# Patient Record
Sex: Female | Born: 1960
Health system: Southern US, Community
[De-identification: ages and names within clinical notes are randomized; demographics above are authoritative.]

## PROBLEM LIST (undated history)

## (undated) DIAGNOSIS — I10 Essential (primary) hypertension: Secondary | ICD-10-CM

## (undated) DIAGNOSIS — O009 Unspecified ectopic pregnancy without intrauterine pregnancy: Secondary | ICD-10-CM

## (undated) DIAGNOSIS — R51 Headache: Secondary | ICD-10-CM

## (undated) DIAGNOSIS — R112 Nausea with vomiting, unspecified: Secondary | ICD-10-CM

## (undated) DIAGNOSIS — Z9289 Personal history of other medical treatment: Secondary | ICD-10-CM

## (undated) DIAGNOSIS — D649 Anemia, unspecified: Secondary | ICD-10-CM

## (undated) DIAGNOSIS — F32A Depression, unspecified: Secondary | ICD-10-CM

## (undated) DIAGNOSIS — T8859XA Other complications of anesthesia, initial encounter: Secondary | ICD-10-CM

## (undated) DIAGNOSIS — IMO0002 Reserved for concepts with insufficient information to code with codable children: Secondary | ICD-10-CM

## (undated) DIAGNOSIS — J45909 Unspecified asthma, uncomplicated: Secondary | ICD-10-CM

## (undated) DIAGNOSIS — S069X1A Unspecified intracranial injury with loss of consciousness of 30 minutes or less, initial encounter: Secondary | ICD-10-CM

## (undated) DIAGNOSIS — R519 Headache, unspecified: Secondary | ICD-10-CM

## (undated) DIAGNOSIS — F329 Major depressive disorder, single episode, unspecified: Secondary | ICD-10-CM

## (undated) DIAGNOSIS — Z9889 Other specified postprocedural states: Secondary | ICD-10-CM

## (undated) DIAGNOSIS — T4145XA Adverse effect of unspecified anesthetic, initial encounter: Secondary | ICD-10-CM

## (undated) DIAGNOSIS — I739 Peripheral vascular disease, unspecified: Secondary | ICD-10-CM

## (undated) DIAGNOSIS — F4310 Post-traumatic stress disorder, unspecified: Secondary | ICD-10-CM

## (undated) DIAGNOSIS — S069X9A Unspecified intracranial injury with loss of consciousness of unspecified duration, initial encounter: Secondary | ICD-10-CM

## (undated) DIAGNOSIS — Z8489 Family history of other specified conditions: Secondary | ICD-10-CM

## (undated) DIAGNOSIS — K759 Inflammatory liver disease, unspecified: Secondary | ICD-10-CM

## (undated) DIAGNOSIS — C801 Malignant (primary) neoplasm, unspecified: Secondary | ICD-10-CM

## (undated) DIAGNOSIS — F419 Anxiety disorder, unspecified: Secondary | ICD-10-CM

## (undated) DIAGNOSIS — M1990 Unspecified osteoarthritis, unspecified site: Secondary | ICD-10-CM

## (undated) DIAGNOSIS — Z8041 Family history of malignant neoplasm of ovary: Secondary | ICD-10-CM

## (undated) HISTORY — PX: TUBAL LIGATION: SHX77

## (undated) HISTORY — PX: COLONOSCOPY: SHX174

## (undated) HISTORY — PX: ABDOMINAL HYSTERECTOMY: SHX81

## (undated) HISTORY — DX: Family history of malignant neoplasm of ovary: Z80.41

---

## 2004-04-04 ENCOUNTER — Encounter: Admission: RE | Admit: 2004-04-04 | Discharge: 2004-04-04 | Payer: Self-pay | Admitting: Unknown Physician Specialty

## 2004-05-20 ENCOUNTER — Observation Stay (HOSPITAL_COMMUNITY): Admission: RE | Admit: 2004-05-20 | Discharge: 2004-05-21 | Payer: Self-pay | Admitting: Interventional Radiology

## 2004-07-25 ENCOUNTER — Encounter: Admission: RE | Admit: 2004-07-25 | Discharge: 2004-07-25 | Payer: Self-pay | Admitting: Interventional Radiology

## 2005-01-23 ENCOUNTER — Ambulatory Visit: Payer: Self-pay | Admitting: Cardiology

## 2005-02-11 ENCOUNTER — Ambulatory Visit: Payer: Self-pay | Admitting: Cardiology

## 2005-02-20 ENCOUNTER — Ambulatory Visit: Payer: Self-pay | Admitting: Cardiology

## 2005-03-11 ENCOUNTER — Ambulatory Visit: Payer: Self-pay | Admitting: Cardiology

## 2005-04-09 ENCOUNTER — Ambulatory Visit: Payer: Self-pay | Admitting: Cardiology

## 2005-08-14 ENCOUNTER — Ambulatory Visit: Payer: Self-pay | Admitting: Cardiology

## 2010-12-14 ENCOUNTER — Encounter: Payer: Self-pay | Admitting: Interventional Radiology

## 2010-12-15 ENCOUNTER — Encounter: Payer: Self-pay | Admitting: Interventional Radiology

## 2010-12-15 ENCOUNTER — Encounter: Payer: Self-pay | Admitting: Unknown Physician Specialty

## 2013-07-14 DIAGNOSIS — Z90710 Acquired absence of both cervix and uterus: Secondary | ICD-10-CM | POA: Diagnosis not present

## 2013-07-14 DIAGNOSIS — Z0000 Encounter for general adult medical examination without abnormal findings: Secondary | ICD-10-CM | POA: Diagnosis not present

## 2013-07-18 DIAGNOSIS — S63509A Unspecified sprain of unspecified wrist, initial encounter: Secondary | ICD-10-CM | POA: Diagnosis not present

## 2013-07-18 DIAGNOSIS — Z79899 Other long term (current) drug therapy: Secondary | ICD-10-CM | POA: Diagnosis not present

## 2013-07-18 DIAGNOSIS — M19039 Primary osteoarthritis, unspecified wrist: Secondary | ICD-10-CM | POA: Diagnosis not present

## 2013-07-18 DIAGNOSIS — I10 Essential (primary) hypertension: Secondary | ICD-10-CM | POA: Diagnosis not present

## 2013-07-18 DIAGNOSIS — F17200 Nicotine dependence, unspecified, uncomplicated: Secondary | ICD-10-CM | POA: Diagnosis not present

## 2014-07-26 DIAGNOSIS — J00 Acute nasopharyngitis [common cold]: Secondary | ICD-10-CM | POA: Diagnosis not present

## 2014-07-26 DIAGNOSIS — I10 Essential (primary) hypertension: Secondary | ICD-10-CM | POA: Diagnosis not present

## 2014-07-26 DIAGNOSIS — R059 Cough, unspecified: Secondary | ICD-10-CM | POA: Diagnosis not present

## 2014-07-26 DIAGNOSIS — R05 Cough: Secondary | ICD-10-CM | POA: Diagnosis not present

## 2014-09-14 DIAGNOSIS — I10 Essential (primary) hypertension: Secondary | ICD-10-CM | POA: Diagnosis not present

## 2014-09-16 ENCOUNTER — Inpatient Hospital Stay (HOSPITAL_COMMUNITY)
Admission: AD | Admit: 2014-09-16 | Discharge: 2014-09-22 | DRG: 330 | Disposition: A | Discharge status: Home / Self Care | Payer: Medicare Other | Source: Other Acute Inpatient Hospital | Attending: General Surgery | Admitting: General Surgery

## 2014-09-16 ENCOUNTER — Encounter (HOSPITAL_COMMUNITY): Payer: Self-pay | Admitting: Gastroenterology

## 2014-09-16 DIAGNOSIS — I10 Essential (primary) hypertension: Secondary | ICD-10-CM | POA: Diagnosis not present

## 2014-09-16 DIAGNOSIS — G8929 Other chronic pain: Secondary | ICD-10-CM | POA: Diagnosis present

## 2014-09-16 DIAGNOSIS — D490 Neoplasm of unspecified behavior of digestive system: Secondary | ICD-10-CM | POA: Diagnosis not present

## 2014-09-16 DIAGNOSIS — K2960 Other gastritis without bleeding: Secondary | ICD-10-CM | POA: Diagnosis present

## 2014-09-16 DIAGNOSIS — K567 Ileus, unspecified: Secondary | ICD-10-CM | POA: Diagnosis not present

## 2014-09-16 DIAGNOSIS — K5660 Unspecified intestinal obstruction: Secondary | ICD-10-CM | POA: Diagnosis not present

## 2014-09-16 DIAGNOSIS — D649 Anemia, unspecified: Secondary | ICD-10-CM | POA: Diagnosis present

## 2014-09-16 DIAGNOSIS — M1990 Unspecified osteoarthritis, unspecified site: Secondary | ICD-10-CM | POA: Diagnosis present

## 2014-09-16 DIAGNOSIS — R112 Nausea with vomiting, unspecified: Secondary | ICD-10-CM | POA: Diagnosis not present

## 2014-09-16 DIAGNOSIS — Z789 Other specified health status: Secondary | ICD-10-CM | POA: Diagnosis present

## 2014-09-16 DIAGNOSIS — F1210 Cannabis abuse, uncomplicated: Secondary | ICD-10-CM | POA: Diagnosis present

## 2014-09-16 DIAGNOSIS — K5289 Other specified noninfective gastroenteritis and colitis: Secondary | ICD-10-CM | POA: Diagnosis not present

## 2014-09-16 DIAGNOSIS — K920 Hematemesis: Secondary | ICD-10-CM | POA: Diagnosis present

## 2014-09-16 DIAGNOSIS — R1900 Intra-abdominal and pelvic swelling, mass and lump, unspecified site: Secondary | ICD-10-CM | POA: Diagnosis not present

## 2014-09-16 DIAGNOSIS — F17290 Nicotine dependence, other tobacco product, uncomplicated: Secondary | ICD-10-CM | POA: Diagnosis present

## 2014-09-16 DIAGNOSIS — C180 Malignant neoplasm of cecum: Secondary | ICD-10-CM | POA: Diagnosis present

## 2014-09-16 DIAGNOSIS — E876 Hypokalemia: Secondary | ICD-10-CM | POA: Diagnosis not present

## 2014-09-16 DIAGNOSIS — C172 Malignant neoplasm of ileum: Secondary | ICD-10-CM | POA: Diagnosis not present

## 2014-09-16 DIAGNOSIS — C182 Malignant neoplasm of ascending colon: Secondary | ICD-10-CM | POA: Diagnosis not present

## 2014-09-16 DIAGNOSIS — C786 Secondary malignant neoplasm of retroperitoneum and peritoneum: Secondary | ICD-10-CM | POA: Diagnosis not present

## 2014-09-16 DIAGNOSIS — D500 Iron deficiency anemia secondary to blood loss (chronic): Secondary | ICD-10-CM | POA: Diagnosis not present

## 2014-09-16 DIAGNOSIS — K449 Diaphragmatic hernia without obstruction or gangrene: Secondary | ICD-10-CM | POA: Diagnosis not present

## 2014-09-16 DIAGNOSIS — E871 Hypo-osmolality and hyponatremia: Secondary | ICD-10-CM | POA: Diagnosis not present

## 2014-09-16 DIAGNOSIS — Z7289 Other problems related to lifestyle: Secondary | ICD-10-CM | POA: Diagnosis present

## 2014-09-16 DIAGNOSIS — K922 Gastrointestinal hemorrhage, unspecified: Secondary | ICD-10-CM | POA: Diagnosis not present

## 2014-09-16 DIAGNOSIS — R109 Unspecified abdominal pain: Secondary | ICD-10-CM | POA: Diagnosis not present

## 2014-09-16 DIAGNOSIS — R195 Other fecal abnormalities: Secondary | ICD-10-CM | POA: Diagnosis not present

## 2014-09-16 DIAGNOSIS — K2940 Chronic atrophic gastritis without bleeding: Secondary | ICD-10-CM | POA: Diagnosis not present

## 2014-09-16 DIAGNOSIS — F17200 Nicotine dependence, unspecified, uncomplicated: Secondary | ICD-10-CM | POA: Diagnosis not present

## 2014-09-16 DIAGNOSIS — R634 Abnormal weight loss: Secondary | ICD-10-CM | POA: Diagnosis present

## 2014-09-16 DIAGNOSIS — Z01818 Encounter for other preprocedural examination: Secondary | ICD-10-CM

## 2014-09-16 DIAGNOSIS — K561 Intussusception: Secondary | ICD-10-CM | POA: Diagnosis not present

## 2014-09-16 DIAGNOSIS — K921 Melena: Secondary | ICD-10-CM | POA: Diagnosis not present

## 2014-09-16 DIAGNOSIS — Z79899 Other long term (current) drug therapy: Secondary | ICD-10-CM | POA: Diagnosis not present

## 2014-09-16 DIAGNOSIS — F1099 Alcohol use, unspecified with unspecified alcohol-induced disorder: Secondary | ICD-10-CM

## 2014-09-16 DIAGNOSIS — R1013 Epigastric pain: Secondary | ICD-10-CM | POA: Diagnosis present

## 2014-09-16 HISTORY — DX: Essential (primary) hypertension: I10

## 2014-09-16 HISTORY — DX: Unspecified ectopic pregnancy without intrauterine pregnancy: O00.90

## 2014-09-16 LAB — COMPREHENSIVE METABOLIC PANEL
ALK PHOS: 92 U/L (ref 39–117)
ALT: 8 U/L (ref 0–35)
AST: 17 U/L (ref 0–37)
Albumin: 3.7 g/dL (ref 3.5–5.2)
Anion gap: 14 (ref 5–15)
BILIRUBIN TOTAL: 0.4 mg/dL (ref 0.3–1.2)
BUN: 8 mg/dL (ref 6–23)
CHLORIDE: 102 meq/L (ref 96–112)
CO2: 23 meq/L (ref 19–32)
Calcium: 9.1 mg/dL (ref 8.4–10.5)
Creatinine, Ser: 0.86 mg/dL (ref 0.50–1.10)
GFR calc Af Amer: 88 mL/min — ABNORMAL LOW (ref >90)
GFR, EST NON AFRICAN AMERICAN: 76 mL/min — AB (ref >90)
Glucose, Bld: 102 mg/dL — ABNORMAL HIGH (ref 70–99)
Potassium: 3.7 mEq/L (ref 3.7–5.3)
Sodium: 139 mEq/L (ref 137–147)
Total Protein: 7.6 g/dL (ref 6.0–8.3)

## 2014-09-16 LAB — PROTIME-INR
INR: 1.06 (ref 0.00–1.49)
PROTHROMBIN TIME: 13.9 s (ref 11.6–15.2)

## 2014-09-16 LAB — CBC
HEMATOCRIT: 32.3 % — AB (ref 36.0–46.0)
HEMOGLOBIN: 11.7 g/dL — AB (ref 12.0–15.0)
MCH: 35.2 pg — ABNORMAL HIGH (ref 26.0–34.0)
MCHC: 36.2 g/dL — AB (ref 30.0–36.0)
MCV: 97.3 fL (ref 78.0–100.0)
PLATELETS: 168 10*3/uL (ref 150–400)
RBC: 3.32 MIL/uL — ABNORMAL LOW (ref 3.87–5.11)
RDW: 12 % (ref 11.5–15.5)
WBC: 5.8 10*3/uL (ref 4.0–10.5)

## 2014-09-16 LAB — LIPASE, BLOOD: Lipase: 14 U/L (ref 11–59)

## 2014-09-16 MED ORDER — FOLIC ACID 1 MG PO TABS
1.0000 mg | ORAL_TABLET | Freq: Every day | ORAL | Status: DC
  Administered 2014-09-17 – 2014-09-21 (×4): 1 mg via ORAL
  Filled 2014-09-16 (×7): qty 1

## 2014-09-16 MED ORDER — ACETAMINOPHEN 325 MG PO TABS
650.0000 mg | ORAL_TABLET | Freq: Four times a day (QID) | ORAL | Status: DC | PRN
  Administered 2014-09-17: 650 mg via ORAL
  Filled 2014-09-16: qty 2

## 2014-09-16 MED ORDER — TRAMADOL HCL 50 MG PO TABS
25.0000 mg | ORAL_TABLET | Freq: Four times a day (QID) | ORAL | Status: DC | PRN
  Administered 2014-09-16 – 2014-09-18 (×4): 25 mg via ORAL
  Filled 2014-09-16 (×4): qty 1

## 2014-09-16 MED ORDER — ONDANSETRON HCL 4 MG PO TABS
4.0000 mg | ORAL_TABLET | Freq: Four times a day (QID) | ORAL | Status: DC | PRN
  Administered 2014-09-21: 4 mg via ORAL
  Filled 2014-09-16: qty 1

## 2014-09-16 MED ORDER — THIAMINE HCL 100 MG/ML IJ SOLN
100.0000 mg | Freq: Every day | INTRAMUSCULAR | Status: DC
  Filled 2014-09-16 (×7): qty 1

## 2014-09-16 MED ORDER — LORAZEPAM 1 MG PO TABS
1.0000 mg | ORAL_TABLET | Freq: Four times a day (QID) | ORAL | Status: AC | PRN
  Administered 2014-09-16: 1 mg via ORAL
  Filled 2014-09-16: qty 1

## 2014-09-16 MED ORDER — VITAMIN B-1 100 MG PO TABS
100.0000 mg | ORAL_TABLET | Freq: Every day | ORAL | Status: DC
  Administered 2014-09-17 – 2014-09-22 (×5): 100 mg via ORAL
  Filled 2014-09-16 (×7): qty 1

## 2014-09-16 MED ORDER — SODIUM CHLORIDE 0.9 % IV SOLN
INTRAVENOUS | Status: DC
  Administered 2014-09-16 – 2014-09-17 (×2): via INTRAVENOUS

## 2014-09-16 MED ORDER — ADULT MULTIVITAMIN W/MINERALS CH
1.0000 | ORAL_TABLET | Freq: Every day | ORAL | Status: DC
  Administered 2014-09-17: 1 via ORAL
  Filled 2014-09-16 (×3): qty 1

## 2014-09-16 MED ORDER — ONDANSETRON HCL 4 MG/2ML IJ SOLN
4.0000 mg | Freq: Four times a day (QID) | INTRAMUSCULAR | Status: DC | PRN
  Administered 2014-09-17 – 2014-09-22 (×2): 4 mg via INTRAVENOUS
  Filled 2014-09-16 (×2): qty 2

## 2014-09-16 MED ORDER — PANTOPRAZOLE SODIUM 40 MG IV SOLR
40.0000 mg | Freq: Two times a day (BID) | INTRAVENOUS | Status: DC
  Administered 2014-09-16 – 2014-09-17 (×2): 40 mg via INTRAVENOUS
  Filled 2014-09-16 (×3): qty 40

## 2014-09-16 MED ORDER — GI COCKTAIL ~~LOC~~
30.0000 mL | Freq: Three times a day (TID) | ORAL | Status: DC | PRN
  Administered 2014-09-16: 30 mL via ORAL
  Filled 2014-09-16 (×2): qty 30

## 2014-09-16 MED ORDER — LORAZEPAM 2 MG/ML IJ SOLN
1.0000 mg | Freq: Four times a day (QID) | INTRAMUSCULAR | Status: AC | PRN
  Administered 2014-09-17: 1 mg via INTRAVENOUS
  Filled 2014-09-16: qty 1

## 2014-09-16 NOTE — H&P (Signed)
   History and Physical    Catherine Rogers OXB:[id number] DOB: Apr 07, 1961 DOA: 09/16/2014  Referring physician: [facility] PCP: No primary provider on file.  Specialists: GI, Dr. Watt Climes  Chief Complaint: GI bleed  HPI: Catherine Rogers is a 53 y.o. female has a past medical history significant for chronic pain due to arthritis, HTN, marijuana use, coming from [facility] with G bleed and without GI services available over there. Patient states that she started having abdominal pain last night, mid-epigastric, with nausea and vomiting and poor po intake since. She also noticed coffee ground material as well as fresh blood in her emesis. She also noticed dark stools for the past 2 days and today noticed also some red and possible blood clots. Denies fevers but endorses chills, no chest pain or breathing difficulties. She is a smoker and smokes only marijuana to help with her chronic pain. She endorses drinking ETOH daily, states is not a big drinker, has one "cooler" per day, drank more in the past and used to use Gin. She states that she was never a big drinker. She takes Ibuprofen 800 mg almost daily and sometimes three times per day due to pain. She is not on any PPI and takes Ranitidine "once in a blue moon" is she has discomfort.   Review of Systems:  As per HPI otherwise negative   Past medical history HTN  Social History: marijuana, alcohol  Allergies Codeine Meperidine Morphine Penicillins  Family history non contributory   Home medications: Ibuprofen HCTZ Ranitidine  Physical Exam: Filed Vitals:   09/16/14 1649  BP: 113/67  Pulse: 71  Temp: 98.4 F (36.9 C)  TempSrc: Oral  Resp: 16  Height: $Remove'5\' 7"'aEkkxWX$  (1.702 m)  Weight: 67.45 kg (148 lb 11.2 oz)  SpO2: 96%    General:  No apparent distress  Eyes: no scleral icterus  ENT: moist oropharynx  Neck: supple, no JVD  Cardiovascular: regular rate without MRG; 2+ peripheral pulses  Respiratory: CTA biL,  good air movement without wheezing  Abdomen: soft, mild tenderness to palpation epigastric area  Skin: no rashes  Musculoskeletal: no peripheral edema  Psychiatric: normal mood and affect  Neurologic: non focal  Labs at [facility] Hb 12.3 MCV 102 Platelets 192 INR 1.0 BUN 10 Creatinine 1.0 Ca 8.9 Total Protein 7.7 Albumin 4.1 Alk phos 83 AST 23 T bili 0.7 ALT 12 Na 135 CO2 22 K 3.1   EKG: Independently reviewed. Sinus rhythm  Assessment/Plan Active Problems:   GI bleed   Alcohol use    GI bleed - patient with active ETOH use and Ibuprofen daily, concern for bleeding ulcer - no apparent liver disease with normal platelets, INR, LFTs - discussed with Dr. Watt Climes from [place], she is stable currently, Hb 12, normotensive, will plan for upper EGD in am, will allow clear liquids tonight and NPO past midnight  ETOH use  - place on CIWA  Chronic pain - patient wants to avoid narcotics not to get "hooked on" - tylenol / tramadol for mild/moderate pain   Diet: clears Fluids: NS at 100 cc /h  DVT Prophylaxis: SCDs  Code Status: Full  Family Communication:  D/w patient   Disposition Plan: inpatient, home when stable  Time spent: 42  Yamen Castrogiovanni M. Cruzita Lederer, MD [facility] Pager [phone]  If 7PM-7AM, please contact night-coverage [email or web address] Password [facility] 09/16/2014, 4:35 PM

## 2014-09-16 NOTE — Consult Note (Deleted)
Reason for Consult:probable food impaction Referring Physician: ER team  Catherine Rogers is an 53 y.o. female.  HPI: patient well-known to my partner who called me earlier today and had a probable food impaction and they were directed to the emergency room when it did not resolve and glucagon there and nitroglycerin was not helpful and the patient and her husband have consented to an endoscopy and she has a history of swallowing problems and achalasia status post surgery and a dilation about a year ago and her office computer chart was reviewed and she's been having some mild dysphagia episodically but nothing this bad and she has no known allergies and is not on any aspirin or blood thinners  History reviewed. No pertinent past medical history.  History reviewed. No pertinent past surgical history.  No family history on file.  Social History:  has no tobacco, alcohol, and drug history on file.  Allergies: Not on File  Medications: I have reviewed the patient's current medications.  No results found for this or any previous visit (from the past 48 hour(s)).  No results found.  ROSnegative except above Blood pressure 113/67, pulse 71, temperature 98.4 F (36.9 C), temperature source Oral, resp. rate 16, height 5\' 7"  (1.702 m), weight 67.45 kg (148 lb 11.2 oz), SpO2 96.00%. Physical Examvital signs stable afebrile acute distress examined please see preassessment evaluation  Assessment/Plan: Probable food impaction Plan: The risks benefits methods of endoscopy and removing food or pushing the food down was discussed with the patient and her husband and will proceed ASAP with further workup and plans pending those findings  Shakaya Bhullar E 09/16/2014, 6:00 PM

## 2014-09-17 ENCOUNTER — Encounter (HOSPITAL_COMMUNITY): Payer: Medicare Other | Admitting: Anesthesiology

## 2014-09-17 ENCOUNTER — Inpatient Hospital Stay (HOSPITAL_COMMUNITY): Payer: Medicare Other | Admitting: Anesthesiology

## 2014-09-17 ENCOUNTER — Encounter (HOSPITAL_COMMUNITY)
Admission: AD | Disposition: A | Discharge status: Home / Self Care | Payer: Self-pay | Source: Other Acute Inpatient Hospital

## 2014-09-17 ENCOUNTER — Encounter (HOSPITAL_COMMUNITY): Payer: Self-pay | Admitting: *Deleted

## 2014-09-17 ENCOUNTER — Inpatient Hospital Stay (HOSPITAL_COMMUNITY): Payer: Medicare Other

## 2014-09-17 HISTORY — PX: ESOPHAGOGASTRODUODENOSCOPY: SHX5428

## 2014-09-17 LAB — CBC
HCT: 27.9 % — ABNORMAL LOW (ref 36.0–46.0)
HCT: 27.4 % — ABNORMAL LOW (ref 36.0–46.0)
HEMOGLOBIN: 10.1 g/dL — AB (ref 12.0–15.0)
Hemoglobin: 10.1 g/dL — ABNORMAL LOW (ref 12.0–15.0)
MCH: 34.5 pg — ABNORMAL HIGH (ref 26.0–34.0)
MCH: 35.1 pg — ABNORMAL HIGH (ref 26.0–34.0)
MCHC: 36.2 g/dL — ABNORMAL HIGH (ref 30.0–36.0)
MCHC: 36.9 g/dL — ABNORMAL HIGH (ref 30.0–36.0)
MCV: 95.2 fL (ref 78.0–100.0)
MCV: 95.1 fL (ref 78.0–100.0)
PLATELETS: 141 10*3/uL — AB (ref 150–400)
Platelets: 164 10*3/uL (ref 150–400)
RBC: 2.93 MIL/uL — AB (ref 3.87–5.11)
RBC: 2.88 MIL/uL — AB (ref 3.87–5.11)
RDW: 11.8 % (ref 11.5–15.5)
RDW: 11.7 % (ref 11.5–15.5)
WBC: 5.4 10*3/uL (ref 4.0–10.5)
WBC: 5.6 10*3/uL (ref 4.0–10.5)

## 2014-09-17 SURGERY — EGD (ESOPHAGOGASTRODUODENOSCOPY)
Anesthesia: Monitor Anesthesia Care

## 2014-09-17 SURGERY — ESOPHAGOGASTRODUODENOSCOPY (EGD) WITH PROPOFOL
Anesthesia: Monitor Anesthesia Care

## 2014-09-17 MED ORDER — HYDRALAZINE HCL 20 MG/ML IJ SOLN: 5.0000 mg | INTRAMUSCULAR | Status: DC | PRN

## 2014-09-17 MED ORDER — LIDOCAINE HCL (CARDIAC) 20 MG/ML IV SOLN
INTRAVENOUS | Status: DC | PRN
  Administered 2014-09-17: 40 mg via INTRAVENOUS

## 2014-09-17 MED ORDER — PANTOPRAZOLE SODIUM 40 MG PO TBEC
40.0000 mg | DELAYED_RELEASE_TABLET | Freq: Two times a day (BID) | ORAL | Status: DC
  Administered 2014-09-17: 40 mg via ORAL
  Filled 2014-09-17 (×2): qty 1

## 2014-09-17 MED ORDER — PROPOFOL INFUSION 10 MG/ML OPTIME
INTRAVENOUS | Status: DC | PRN
Start: 2014-09-17 — End: 2014-09-17
  Administered 2014-09-17: 100 ug/kg/min via INTRAVENOUS

## 2014-09-17 MED ORDER — PROMETHAZINE HCL 25 MG/ML IJ SOLN: 6.2500 mg | INTRAMUSCULAR | Status: DC | PRN

## 2014-09-17 MED ORDER — IOHEXOL 300 MG/ML  SOLN
100.0000 mL | Freq: Once | INTRAMUSCULAR | Status: AC | PRN
  Administered 2014-09-17: 100 mL via INTRAVENOUS

## 2014-09-17 MED ORDER — IOHEXOL 300 MG/ML  SOLN
25.0000 mL | INTRAMUSCULAR | Status: AC
  Administered 2014-09-17 (×2): 25 mL via ORAL

## 2014-09-17 MED ORDER — SODIUM CHLORIDE 0.9 % IV SOLN
INTRAVENOUS | Status: DC
  Administered 2014-09-17: 08:00:00 via INTRAVENOUS

## 2014-09-17 MED ORDER — LISINOPRIL 5 MG PO TABS
5.0000 mg | ORAL_TABLET | Freq: Every day | ORAL | Status: DC
  Administered 2014-09-17: 5 mg via ORAL
  Filled 2014-09-17 (×2): qty 1

## 2014-09-17 MED ORDER — MIDAZOLAM HCL 2 MG/2ML IJ SOLN
INTRAMUSCULAR | Status: DC | PRN
  Administered 2014-09-17: 2 mg via INTRAVENOUS

## 2014-09-17 MED ORDER — BUTAMBEN-TETRACAINE-BENZOCAINE 2-2-14 % EX AERO
INHALATION_SPRAY | CUTANEOUS | Status: DC | PRN
  Administered 2014-09-17: 2 via TOPICAL

## 2014-09-17 MED ORDER — PROPOFOL 10 MG/ML IV BOLUS
INTRAVENOUS | Status: DC | PRN
  Administered 2014-09-17: 10 mg via INTRAVENOUS
  Administered 2014-09-17 (×2): 20 mg via INTRAVENOUS

## 2014-09-17 MED ORDER — HYDROMORPHONE HCL 1 MG/ML IJ SOLN
0.2500 mg | INTRAMUSCULAR | Status: DC | PRN
  Administered 2014-09-17: 0.5 mg via INTRAVENOUS
  Filled 2014-09-17: qty 1

## 2014-09-17 NOTE — Progress Notes (Signed)
   PROGRESS NOTE  Renika Shiflet SPQ:[id number] DOB: 12-Jul-1961 DOA: 09/16/2014 PCP: No PCP Per Patient  HPI: Destini Cambre is a 53 y.o. female has a past medical history significant for chronic pain due to arthritis, HTN, marijuana use, coming from [facility] with G bleed and without GI services available over there.   Subjective/ 24 H Interval events - did well with the EGD, continues to have abdominal pain   Active Problems:   GI bleed   Alcohol use   Assessment/Plan: GI bleed  - GI consulted, s/p EGD this morning without a clear source - no apparent liver disease with normal platelets, INR, LFTs  - continues to have abdominal pain, obtain CT abdomen pelvis ETOH use  - place on CIWA  - not triggering Chronic pain  - patient wants to avoid narcotics not to get "hooked on"  - tylenol / tramadol for mild/moderate pain   Diet: clears Fluids: none DVT Prophylaxis: SCD  Code Status: Full Family Communication: none  Disposition Plan: home when ready   Consultants:  GI  Procedures:  EGD 10/25 ENDOSCOPIC IMPRESSION: 1. Tiny hiatal hernia 2. Antritis 3. Otherwise within normal limits EGD   Antibiotics None   Studies None   Objective  Filed Vitals:   09/17/14 1130 09/17/14 1137 09/17/14 1200 09/17/14 1219  BP: 178/85 175/86 176/74 132/69  Pulse:   74 64  Temp:    98.4 F (36.9 C)  TempSrc:    Oral  Resp: 18 18  16   Height:      Weight:      SpO2: 100% 99%  99%    Intake/Output Summary (Last 24 hours) at 09/17/14 1401 Last data filed at 09/17/14 1153  Gross per 24 hour  Intake 1638.33 ml  Output      0 ml  Net 1638.33 ml   Filed Weights   09/16/14 1649 09/17/14 0824  Weight: 67.45 kg (148 lb 11.2 oz) 67.45 kg (148 lb 11.2 oz)    Exam:  General:  NAD  Cardiovascular: RRR  Respiratory: CTA biL  Abdomen: diffusely tender, epigastric area  MSK: no edema  Data Reviewed: Basic Metabolic Panel:  Recent Labs Lab 09/16/14 1815    NA 139  K 3.7  CL 102  CO2 23  GLUCOSE 102*  BUN 8  CREATININE 0.86  CALCIUM 9.1   Liver Function Tests:  Recent Labs Lab 09/16/14 1815  AST 17  ALT 8  ALKPHOS 92  BILITOT 0.4  PROT 7.6  ALBUMIN 3.7    Recent Labs Lab 09/16/14 1815  LIPASE 14   CBC:  Recent Labs Lab 09/16/14 1815 09/17/14 0053 09/17/14 0920  WBC 5.8 5.4 5.6  HGB 11.7* 10.1* 10.1*  HCT 32.3* 27.9* 27.4*  MCV 97.3 95.2 95.1  PLT 168 164 141*   Scheduled Meds: . folic acid  1 mg Oral Daily  . multivitamin with minerals  1 tablet Oral Daily  . pantoprazole  40 mg Oral BID AC  . thiamine  100 mg Oral Daily   Or  . thiamine  100 mg Intravenous Daily   Continuous Infusions:   Time spent: 25 minutes  Marzetta Board, MD [facility] Pager [phone]. If 7 PM - 7 AM, please contact night-coverage at [email or web address], password [facility] 09/17/2014, 2:01 PM  LOS: 1 day

## 2014-09-17 NOTE — Anesthesia Postprocedure Evaluation (Signed)
Anesthesia Post Note  Patient: Catherine Rogers  Procedure(s) Performed: Procedure(s) (LRB): ESOPHAGOGASTRODUODENOSCOPY (EGD) (N/A)  Anesthesia type: MAC  Patient location: PACU  Post pain: Pain level controlled  Post assessment: Patient's Cardiovascular Status Stable  Last Vitals:  Filed Vitals:   09/17/14 1219  BP: 132/69  Pulse: 64  Temp: 36.9 C  Resp: 16    Post vital signs: Reviewed and stable  Level of consciousness: sedated  Complications: No apparent anesthesia complications

## 2014-09-17 NOTE — Progress Notes (Signed)
Patient was assisted to the bathroom at 04:15 because of nausea.  Patient vomited green bile.  Medicated patient with antiemetic, will continue to monitor.  Wilson Singer, RN

## 2014-09-17 NOTE — Consult Note (Signed)
Reason for Consult: Upper GI bleeding Referring Physician: Hospital team  Catherine Rogers is an 53 y.o. female.  HPI: Patient with a history of reflux and indigestion help with Zantac who has been on some aspirin and nonsteroidals and has had some increased abdominal pain and some hematemesis with both fresh and coffee ground material as well as some blood in her stool which can be dark and bright red and she's never had a GI workup and her family history is negative and her allergies are reviewed and although she smokes marijuana and tobacco should minimize his alcohol and denies other drug use and has no other specific complaints  Past Medical History  Diagnosis Date  . Hypertension     History reviewed. No pertinent past surgical history.  History reviewed. No pertinent family history.  Social History:  reports that she has been smoking Cigarettes.  She has a 1.25 pack-year smoking history. She does not have any smokeless tobacco history on file. She reports that she drinks alcohol. She reports that she uses illicit drugs (Marijuana).  Allergies:  Allergies  Allergen Reactions  . Demerol [Meperidine] Nausea And Vomiting  . Morphine And Related Nausea And Vomiting  . Penicillins Other (See Comments)    Childhood allergy    Medications: I have reviewed the patient's current medications.  Results for orders placed during the hospital encounter of 09/16/14 (from the past 48 hour(s))  CBC     Status: Abnormal   Collection Time    09/16/14  6:15 PM      Result Value Ref Range   WBC 5.8  4.0 - 10.5 K/uL   RBC 3.32 (*) 3.87 - 5.11 MIL/uL   Hemoglobin 11.7 (*) 12.0 - 15.0 g/dL   HCT 32.3 (*) 36.0 - 46.0 %   MCV 97.3  78.0 - 100.0 fL   MCH 35.2 (*) 26.0 - 34.0 pg   MCHC 36.2 (*) 30.0 - 36.0 g/dL   RDW 12.0  11.5 - 15.5 %   Platelets 168  150 - 400 K/uL  COMPREHENSIVE METABOLIC PANEL     Status: Abnormal   Collection Time    09/16/14  6:15 PM      Result Value Ref Range   Sodium  139  137 - 147 mEq/L   Potassium 3.7  3.7 - 5.3 mEq/L   Chloride 102  96 - 112 mEq/L   CO2 23  19 - 32 mEq/L   Glucose, Bld 102 (*) 70 - 99 mg/dL   BUN 8  6 - 23 mg/dL   Creatinine, Ser 0.86  0.50 - 1.10 mg/dL   Calcium 9.1  8.4 - 10.5 mg/dL   Total Protein 7.6  6.0 - 8.3 g/dL   Albumin 3.7  3.5 - 5.2 g/dL   AST 17  0 - 37 U/L   ALT 8  0 - 35 U/L   Alkaline Phosphatase 92  39 - 117 U/L   Total Bilirubin 0.4  0.3 - 1.2 mg/dL   GFR calc non Af Amer 76 (*) >90 mL/min   GFR calc Af Amer 88 (*) >90 mL/min   Comment: (NOTE)     The eGFR has been calculated using the CKD EPI equation.     This calculation has not been validated in all clinical situations.     eGFR's persistently <90 mL/min signify possible Chronic Kidney     Disease.   Anion gap 14  5 - 15  PROTIME-INR     Status: None  Collection Time    09/16/14  6:15 PM      Result Value Ref Range   Prothrombin Time 13.9  11.6 - 15.2 seconds   INR 1.06  0.00 - 1.49  LIPASE, BLOOD     Status: None   Collection Time    09/16/14  6:15 PM      Result Value Ref Range   Lipase 14  11 - 59 U/L  CBC     Status: Abnormal   Collection Time    09/17/14 12:53 AM      Result Value Ref Range   WBC 5.4  4.0 - 10.5 K/uL   RBC 2.93 (*) 3.87 - 5.11 MIL/uL   Hemoglobin 10.1 (*) 12.0 - 15.0 g/dL   HCT 27.9 (*) 36.0 - 46.0 %   MCV 95.2  78.0 - 100.0 fL   MCH 34.5 (*) 26.0 - 34.0 pg   MCHC 36.2 (*) 30.0 - 36.0 g/dL   RDW 11.8  11.5 - 15.5 %   Platelets 164  150 - 400 K/uL  CBC     Status: Abnormal   Collection Time    09/17/14  9:20 AM      Result Value Ref Range   WBC 5.6  4.0 - 10.5 K/uL   RBC 2.88 (*) 3.87 - 5.11 MIL/uL   Hemoglobin 10.1 (*) 12.0 - 15.0 g/dL   HCT 27.4 (*) 36.0 - 46.0 %   MCV 95.1  78.0 - 100.0 fL   MCH 35.1 (*) 26.0 - 34.0 pg   MCHC 36.9 (*) 30.0 - 36.0 g/dL   RDW 11.7  11.5 - 15.5 %   Platelets 141 (*) 150 - 400 K/uL    No results found.  ROS negative except above Blood pressure 201/117, pulse 83,  temperature 98.4 F (36.9 C), temperature source Oral, resp. rate 20, height 5' 7"  (1.702 m), weight 67.45 kg (148 lb 11.2 oz), SpO2 100.00%. Physical Exam vital signs stable afebrile blood pressure up a little as above exam please see preassessment evaluation she does have some abdominal discomfort which is not reproducible but no rebound good bowel sounds hemoglobin stable BUN okay  Assessment/Plan: Upper GI bleeding Plan: The risks benefits methods of endoscopy was discussed and will proceed this morning with further workup and plans pending those findings  Catherine Rogers E 09/17/2014, 11:06 AM

## 2014-09-17 NOTE — Op Note (Signed)
[facility] [place], [id number]   ENDOSCOPY PROCEDURE REPORT  PATIENTAdonia, Catherine Rogers  MR#: [id number] BIRTHDATE: May 03, 1961 , 24  yrs. old GENDER: female ENDOSCOPIST: Clarene Essex, MD REFERRED BY: PROCEDURE DATE:  09/25/14 PROCEDURE:  EGD, diagnostic ASA CLASS:     Class II INDICATIONS:  hematemesis. MEDICATIONS: Propofol 150 mg IV and Versed 2 mg IV TOPICAL ANESTHETIC: Cetacaine Spray  DESCRIPTION OF PROCEDURE: After the risks benefits and alternatives of the procedure were thoroughly explained, informed consent was obtained.  The Pentax Gastroscope [id number] endoscope was introduced through the mouth and advanced to the second portion of the duodenum , Without limitations.  The instrument was slowly withdrawn as the mucosa was fully examined.    the findings are recorded below       Retroflexed views revealed a hiatal hernia.     The scope was then withdrawn from the patient and the procedure completed.  COMPLICATIONS: There were no immediate complications.  ENDOSCOPIC IMPRESSION: 1. Tiny hiatal hernia 2. Antritis 3. Otherwise within normal limits EGD  RECOMMENDATIONS: clear liquid diet continue pump inhibitors avoid aspirin and nonsteroidals consider CAT scan if pain continues otherwise either colonoscopy as an inpatient or an outpatient both based on screening and possible symptoms since she does have some bright red bloodper rectum as well  REPEAT EXAM: as needed  eSigned:  Clarene Essex, MD 25-Sep-2014 11:25 AM    CC:  CPT CODES: ICD CODES:  The ICD and CPT codes recommended by this software are interpretations from the data that the clinical staff has captured with the software.  The verification of the translation of this report to the ICD and CPT codes and modifiers is the sole responsibility of the health care institution and practicing physician where this report was generated.  [place].  will not be held responsible for the validity of the ICD and CPT codes included on this report.  AMA assumes no liability for data contained or not contained herein. CPT is a Designer, television/film set of the [employer].  PATIENT NAMEKanoelani, Catherine Rogers MR#: [id number]

## 2014-09-17 NOTE — Progress Notes (Signed)
09/17/14 Patient has increased B/P of 182/70 call placed to physician for med to decrease. Returned call will order a PO and IV med if needed.

## 2014-09-17 NOTE — Anesthesia Preprocedure Evaluation (Addendum)
Anesthesia Evaluation  Patient identified by MRN, date of birth, ID band Patient awake    Reviewed: Allergy & Precautions, H&P , NPO status , Patient's Chart, lab work & pertinent test results  History of Anesthesia Complications Negative for: history of anesthetic complications  Airway Mallampati: II TM Distance: >3 FB Neck ROM: Full    Dental  (+) Poor Dentition, Dental Advisory Given   Pulmonary Current Smoker,    Pulmonary exam normal       Cardiovascular hypertension, Pt. on medications     Neuro/Psych negative neurological ROS  negative psych ROS   GI/Hepatic Neg liver ROS,   Endo/Other  negative endocrine ROS  Renal/GU negative Renal ROS     Musculoskeletal   Abdominal   Peds  Hematology   Anesthesia Other Findings   Reproductive/Obstetrics                         Anesthesia Physical Anesthesia Plan  ASA: III  Anesthesia Plan: MAC   Post-op Pain Management:    Induction: Intravenous  Airway Management Planned: Simple Face Mask  Additional Equipment:   Intra-op Plan:   Post-operative Plan:   Informed Consent: I have reviewed the patients History and Physical, chart, labs and discussed the procedure including the risks, benefits and alternatives for the proposed anesthesia with the patient or authorized representative who has indicated his/her understanding and acceptance.   Dental advisory given  Plan Discussed with: CRNA, Anesthesiologist and Surgeon  Anesthesia Plan Comments:        Anesthesia Quick Evaluation

## 2014-09-17 NOTE — Transfer of Care (Signed)
Immediate Anesthesia Transfer of Care Note  Patient: Catherine Rogers  Procedure(s) Performed: Procedure(s): ESOPHAGOGASTRODUODENOSCOPY (EGD) (N/A)  Patient Location: Endoscopy Unit  Anesthesia Type:MAC  Level of Consciousness: awake  Airway & Oxygen Therapy: Patient Spontanous Breathing and Patient connected to nasal cannula oxygen  Post-op Assessment: Report given to PACU RN and Post -op Vital signs reviewed and stable  Post vital signs: Reviewed and stable  Complications: No apparent anesthesia complications

## 2014-09-18 ENCOUNTER — Encounter (HOSPITAL_COMMUNITY): Payer: Self-pay | Admitting: Gastroenterology

## 2014-09-18 ENCOUNTER — Inpatient Hospital Stay (HOSPITAL_COMMUNITY): Payer: Medicare Other | Admitting: Anesthesiology

## 2014-09-18 ENCOUNTER — Inpatient Hospital Stay (HOSPITAL_COMMUNITY): Payer: Medicare Other

## 2014-09-18 ENCOUNTER — Encounter (HOSPITAL_COMMUNITY)
Admission: AD | Disposition: A | Discharge status: Home / Self Care | Payer: Self-pay | Source: Other Acute Inpatient Hospital

## 2014-09-18 ENCOUNTER — Encounter (HOSPITAL_COMMUNITY): Payer: Medicare Other | Admitting: Anesthesiology

## 2014-09-18 DIAGNOSIS — K561 Intussusception: Secondary | ICD-10-CM | POA: Diagnosis present

## 2014-09-18 DIAGNOSIS — C180 Malignant neoplasm of cecum: Secondary | ICD-10-CM | POA: Diagnosis present

## 2014-09-18 DIAGNOSIS — R1900 Intra-abdominal and pelvic swelling, mass and lump, unspecified site: Secondary | ICD-10-CM | POA: Diagnosis present

## 2014-09-18 HISTORY — PX: PARTIAL COLECTOMY: SHX5273

## 2014-09-18 HISTORY — PX: ILEOSTOMY: SHX1783

## 2014-09-18 LAB — TYPE AND SCREEN
ABO/RH(D): B POS
Antibody Screen: NEGATIVE

## 2014-09-18 LAB — CBC
HEMATOCRIT: 26.5 % — AB (ref 36.0–46.0)
HEMATOCRIT: 28.7 % — AB (ref 36.0–46.0)
HEMOGLOBIN: 9.7 g/dL — AB (ref 12.0–15.0)
Hemoglobin: 10.4 g/dL — ABNORMAL LOW (ref 12.0–15.0)
MCH: 35.4 pg — ABNORMAL HIGH (ref 26.0–34.0)
MCH: 34.4 pg — AB (ref 26.0–34.0)
MCHC: 36.6 g/dL — AB (ref 30.0–36.0)
MCHC: 36.2 g/dL — ABNORMAL HIGH (ref 30.0–36.0)
MCV: 96.7 fL (ref 78.0–100.0)
MCV: 95 fL (ref 78.0–100.0)
PLATELETS: 153 10*3/uL (ref 150–400)
Platelets: 161 10*3/uL (ref 150–400)
RBC: 2.74 MIL/uL — ABNORMAL LOW (ref 3.87–5.11)
RBC: 3.02 MIL/uL — ABNORMAL LOW (ref 3.87–5.11)
RDW: 11.9 % (ref 11.5–15.5)
RDW: 11.7 % (ref 11.5–15.5)
WBC: 6.9 10*3/uL (ref 4.0–10.5)
WBC: 7.9 10*3/uL (ref 4.0–10.5)

## 2014-09-18 LAB — ABO/RH: ABO/RH(D): B POS

## 2014-09-18 LAB — CREATININE, SERUM
Creatinine, Ser: 0.7 mg/dL (ref 0.50–1.10)
GFR calc non Af Amer: >90 mL/min (ref >90)

## 2014-09-18 SURGERY — COLECTOMY, PARTIAL
Anesthesia: General | Site: Abdomen | Laterality: Right

## 2014-09-18 MED ORDER — HYDROMORPHONE HCL 1 MG/ML IJ SOLN
INTRAMUSCULAR | Status: AC
  Filled 2014-09-18: qty 1

## 2014-09-18 MED ORDER — KCL IN DEXTROSE-NACL 20-5-0.45 MEQ/L-%-% IV SOLN
INTRAVENOUS | Status: DC
  Administered 2014-09-18 – 2014-09-20 (×3): via INTRAVENOUS
  Filled 2014-09-18 (×6): qty 1000

## 2014-09-18 MED ORDER — ONDANSETRON HCL 4 MG/2ML IJ SOLN
INTRAMUSCULAR | Status: AC
  Filled 2014-09-18: qty 2

## 2014-09-18 MED ORDER — SUCCINYLCHOLINE CHLORIDE 20 MG/ML IJ SOLN
INTRAMUSCULAR | Status: DC | PRN
  Administered 2014-09-18: 100 mg via INTRAVENOUS

## 2014-09-18 MED ORDER — MIDAZOLAM HCL 2 MG/2ML IJ SOLN: 0.5000 mg | Freq: Once | INTRAMUSCULAR | Status: DC | PRN

## 2014-09-18 MED ORDER — DIPHENHYDRAMINE HCL 50 MG/ML IJ SOLN
10.0000 mg | Freq: Once | INTRAMUSCULAR | Status: AC
  Administered 2014-09-18: 12.5 mg via INTRAVENOUS

## 2014-09-18 MED ORDER — PHENYLEPHRINE HCL 10 MG/ML IJ SOLN
INTRAMUSCULAR | Status: DC | PRN
  Administered 2014-09-18: 80 ug via INTRAVENOUS

## 2014-09-18 MED ORDER — HYDROMORPHONE 0.3 MG/ML IV SOLN
INTRAVENOUS | Status: AC
  Filled 2014-09-18: qty 25

## 2014-09-18 MED ORDER — PROPOFOL 10 MG/ML IV BOLUS
INTRAVENOUS | Status: DC | PRN
  Administered 2014-09-18: 200 mg via INTRAVENOUS

## 2014-09-18 MED ORDER — CEFTRIAXONE SODIUM 2 G IJ SOLR
2.0000 g | Freq: Once | INTRAMUSCULAR | Status: AC
  Administered 2014-09-19: 2 g via INTRAVENOUS
  Filled 2014-09-18: qty 2

## 2014-09-18 MED ORDER — METRONIDAZOLE IN NACL 5-0.79 MG/ML-% IV SOLN
500.0000 mg | Freq: Three times a day (TID) | INTRAVENOUS | Status: AC
  Administered 2014-09-18 – 2014-09-19 (×2): 500 mg via INTRAVENOUS
  Filled 2014-09-18 (×2): qty 100

## 2014-09-18 MED ORDER — NEOSTIGMINE METHYLSULFATE 10 MG/10ML IV SOLN
INTRAVENOUS | Status: DC | PRN
  Administered 2014-09-18: 5 mg via INTRAVENOUS

## 2014-09-18 MED ORDER — METRONIDAZOLE IN NACL 5-0.79 MG/ML-% IV SOLN
500.0000 mg | INTRAVENOUS | Status: AC
  Administered 2014-09-18: 500 mg via INTRAVENOUS
  Filled 2014-09-18: qty 100

## 2014-09-18 MED ORDER — 0.9 % SODIUM CHLORIDE (POUR BTL) OPTIME
TOPICAL | Status: DC | PRN
  Administered 2014-09-18 (×2): 1000 mL

## 2014-09-18 MED ORDER — SCOPOLAMINE 1 MG/3DAYS TD PT72
1.0000 | MEDICATED_PATCH | Freq: Once | TRANSDERMAL | Status: DC
  Filled 2014-09-18: qty 1

## 2014-09-18 MED ORDER — DIPHENHYDRAMINE HCL 12.5 MG/5ML PO ELIX: 12.5000 mg | ORAL_SOLUTION | Freq: Four times a day (QID) | ORAL | Status: DC | PRN

## 2014-09-18 MED ORDER — MIDAZOLAM HCL 2 MG/2ML IJ SOLN
INTRAMUSCULAR | Status: AC
  Filled 2014-09-18: qty 2

## 2014-09-18 MED ORDER — GLYCOPYRROLATE 0.2 MG/ML IJ SOLN
INTRAMUSCULAR | Status: AC
  Filled 2014-09-18: qty 4

## 2014-09-18 MED ORDER — MEPERIDINE HCL 25 MG/ML IJ SOLN: 6.2500 mg | INTRAMUSCULAR | Status: DC | PRN

## 2014-09-18 MED ORDER — DIPHENHYDRAMINE HCL 50 MG/ML IJ SOLN
12.5000 mg | Freq: Four times a day (QID) | INTRAMUSCULAR | Status: DC | PRN
Start: 2014-09-18 — End: 2014-09-19

## 2014-09-18 MED ORDER — NEOSTIGMINE METHYLSULFATE 10 MG/10ML IV SOLN
INTRAVENOUS | Status: AC
  Filled 2014-09-18: qty 1

## 2014-09-18 MED ORDER — HYDROMORPHONE HCL 1 MG/ML IJ SOLN
0.2500 mg | INTRAMUSCULAR | Status: DC | PRN
  Administered 2014-09-18 (×4): 0.5 mg via INTRAVENOUS

## 2014-09-18 MED ORDER — EPHEDRINE SULFATE 50 MG/ML IJ SOLN
INTRAMUSCULAR | Status: DC | PRN
  Administered 2014-09-18 (×2): 10 mg via INTRAVENOUS

## 2014-09-18 MED ORDER — GLYCOPYRROLATE 0.2 MG/ML IJ SOLN
INTRAMUSCULAR | Status: AC
  Filled 2014-09-18: qty 3

## 2014-09-18 MED ORDER — HYDROMORPHONE 0.3 MG/ML IV SOLN
INTRAVENOUS | Status: DC
  Administered 2014-09-18: 15:00:00 via INTRAVENOUS
  Administered 2014-09-18: 0.799 mg via INTRAVENOUS
  Administered 2014-09-19: 1.19 mg via INTRAVENOUS
  Administered 2014-09-19: 0.3 mg via INTRAVENOUS
  Administered 2014-09-19: 1.19 mg via INTRAVENOUS

## 2014-09-18 MED ORDER — CHLORHEXIDINE GLUCONATE CLOTH 2 % EX PADS
6.0000 | MEDICATED_PAD | Freq: Every day | CUTANEOUS | Status: DC
  Administered 2014-09-18: 6 via TOPICAL

## 2014-09-18 MED ORDER — LACTATED RINGERS IV SOLN
INTRAVENOUS | Status: DC | PRN
  Administered 2014-09-18 (×2): via INTRAVENOUS

## 2014-09-18 MED ORDER — LABETALOL HCL 5 MG/ML IV SOLN
5.0000 mg | INTRAVENOUS | Status: DC | PRN
  Administered 2014-09-18 (×3): 5 mg via INTRAVENOUS

## 2014-09-18 MED ORDER — FENTANYL CITRATE 0.05 MG/ML IJ SOLN
INTRAMUSCULAR | Status: AC
  Filled 2014-09-18: qty 5

## 2014-09-18 MED ORDER — HYDROCODONE-ACETAMINOPHEN 5-325 MG PO TABS: 1.0000 | ORAL_TABLET | Freq: Four times a day (QID) | ORAL | Status: DC | PRN

## 2014-09-18 MED ORDER — OXYCODONE HCL 5 MG/5ML PO SOLN: 5.0000 mg | Freq: Once | ORAL | Status: DC | PRN

## 2014-09-18 MED ORDER — SODIUM CHLORIDE 0.9 % IJ SOLN
9.0000 mL | INTRAMUSCULAR | Status: DC | PRN
Start: 2014-09-18 — End: 2014-09-19

## 2014-09-18 MED ORDER — HYDROMORPHONE HCL 1 MG/ML IJ SOLN
0.5000 mg | INTRAMUSCULAR | Status: DC | PRN
  Administered 2014-09-18: 1 mg via INTRAVENOUS
  Filled 2014-09-18: qty 1

## 2014-09-18 MED ORDER — DEXTROSE 5 % IV SOLN
1.0000 g | INTRAVENOUS | Status: AC
  Administered 2014-09-18: 1 g via INTRAVENOUS
  Filled 2014-09-18: qty 10

## 2014-09-18 MED ORDER — ROCURONIUM BROMIDE 100 MG/10ML IV SOLN
INTRAVENOUS | Status: DC | PRN
  Administered 2014-09-18: 50 mg via INTRAVENOUS

## 2014-09-18 MED ORDER — ENOXAPARIN SODIUM 40 MG/0.4ML ~~LOC~~ SOLN
40.0000 mg | SUBCUTANEOUS | Status: DC
  Administered 2014-09-19 – 2014-09-21 (×2): 40 mg via SUBCUTANEOUS
  Filled 2014-09-18 (×4): qty 0.4

## 2014-09-18 MED ORDER — GLYCOPYRROLATE 0.2 MG/ML IJ SOLN
INTRAMUSCULAR | Status: DC | PRN
  Administered 2014-09-18: .8 mg via INTRAVENOUS

## 2014-09-18 MED ORDER — LIDOCAINE HCL (CARDIAC) 20 MG/ML IV SOLN
INTRAVENOUS | Status: AC
  Filled 2014-09-18: qty 5

## 2014-09-18 MED ORDER — ONDANSETRON HCL 4 MG/2ML IJ SOLN
INTRAMUSCULAR | Status: DC | PRN
Start: 2014-09-18 — End: 2014-09-18
  Administered 2014-09-18: 4 mg via INTRAVENOUS

## 2014-09-18 MED ORDER — ONDANSETRON HCL 4 MG/2ML IJ SOLN
4.0000 mg | Freq: Four times a day (QID) | INTRAMUSCULAR | Status: DC | PRN
  Administered 2014-09-18: 4 mg via INTRAVENOUS
  Filled 2014-09-18: qty 2

## 2014-09-18 MED ORDER — PROPOFOL 10 MG/ML IV BOLUS
INTRAVENOUS | Status: AC
  Filled 2014-09-18: qty 20

## 2014-09-18 MED ORDER — LACTATED RINGERS IV SOLN: INTRAVENOUS | Status: DC

## 2014-09-18 MED ORDER — FENTANYL CITRATE 0.05 MG/ML IJ SOLN
INTRAMUSCULAR | Status: DC | PRN
  Administered 2014-09-18: 250 ug via INTRAVENOUS

## 2014-09-18 MED ORDER — MIDAZOLAM HCL 5 MG/5ML IJ SOLN
INTRAMUSCULAR | Status: DC | PRN
  Administered 2014-09-18: 2 mg via INTRAVENOUS

## 2014-09-18 MED ORDER — POVIDONE-IODINE 10 % EX OINT
TOPICAL_OINTMENT | CUTANEOUS | Status: DC | PRN
  Administered 2014-09-18: 1 via TOPICAL

## 2014-09-18 MED ORDER — NALOXONE HCL 0.4 MG/ML IJ SOLN: 0.4000 mg | INTRAMUSCULAR | Status: DC | PRN

## 2014-09-18 MED ORDER — OXYCODONE HCL 5 MG PO TABS: 5.0000 mg | ORAL_TABLET | Freq: Once | ORAL | Status: DC | PRN

## 2014-09-18 MED ORDER — LABETALOL HCL 5 MG/ML IV SOLN
INTRAVENOUS | Status: AC
  Filled 2014-09-18: qty 4

## 2014-09-18 MED ORDER — LACTATED RINGERS IV SOLN
INTRAVENOUS | Status: DC
  Administered 2014-09-18: 11:00:00 via INTRAVENOUS

## 2014-09-18 MED ORDER — LIDOCAINE HCL (CARDIAC) 20 MG/ML IV SOLN
INTRAVENOUS | Status: DC | PRN
  Administered 2014-09-18: 40 mg via INTRAVENOUS

## 2014-09-18 MED ORDER — POVIDONE-IODINE 10 % EX OINT
TOPICAL_OINTMENT | CUTANEOUS | Status: AC
  Filled 2014-09-18: qty 28.35

## 2014-09-18 MED ORDER — DIPHENHYDRAMINE HCL 50 MG/ML IJ SOLN
INTRAMUSCULAR | Status: AC
  Filled 2014-09-18: qty 1

## 2014-09-18 MED ORDER — PROMETHAZINE HCL 25 MG/ML IJ SOLN: 6.2500 mg | INTRAMUSCULAR | Status: DC | PRN

## 2014-09-18 SURGICAL SUPPLY — 61 items
BLADE SURG ROTATE 9660 (MISCELLANEOUS) IMPLANT
CANISTER SUCTION 2500CC (MISCELLANEOUS) ×2 IMPLANT
CHLORAPREP W/TINT 26ML (MISCELLANEOUS) ×2 IMPLANT
COVER MAYO STAND STRL (DRAPES) ×4 IMPLANT
COVER SURGICAL LIGHT HANDLE (MISCELLANEOUS) ×2 IMPLANT
DRAPE LAPAROSCOPIC ABDOMINAL (DRAPES) ×2 IMPLANT
DRAPE PROXIMA HALF (DRAPES) ×4 IMPLANT
DRAPE UTILITY 15X26 TOWEL STRL (DRAPES) ×1 IMPLANT
DRAPE UTILITY 15X26 W/TAPE STR (DRAPE) ×10 IMPLANT
DRAPE WARM FLUID 44X44 (DRAPE) ×2 IMPLANT
DRSG OPSITE POSTOP 4X10 (GAUZE/BANDAGES/DRESSINGS) ×1 IMPLANT
DRSG OPSITE POSTOP 4X8 (GAUZE/BANDAGES/DRESSINGS) IMPLANT
ELECT BLADE 6.5 EXT (BLADE) ×2 IMPLANT
ELECT CAUTERY BLADE 6.4 (BLADE) ×4 IMPLANT
ELECT REM PT RETURN 9FT ADLT (ELECTROSURGICAL) ×2
ELECTRODE REM PT RTRN 9FT ADLT (ELECTROSURGICAL) ×1 IMPLANT
GLOVE BIO SURGEON STRL SZ 6.5 (GLOVE) ×1 IMPLANT
GLOVE BIO SURGEON STRL SZ7.5 (GLOVE) ×1 IMPLANT
GLOVE BIO SURGEON STRL SZ8 (GLOVE) ×2 IMPLANT
GLOVE BIOGEL PI IND STRL 7.0 (GLOVE) IMPLANT
GLOVE BIOGEL PI IND STRL 7.5 (GLOVE) IMPLANT
GLOVE BIOGEL PI IND STRL 8 (GLOVE) ×2 IMPLANT
GLOVE BIOGEL PI INDICATOR 7.0 (GLOVE) ×1
GLOVE BIOGEL PI INDICATOR 7.5 (GLOVE) ×2
GLOVE BIOGEL PI INDICATOR 8 (GLOVE) ×4
GLOVE ECLIPSE 7.5 STRL STRAW (GLOVE) ×5 IMPLANT
GOWN STRL REUS W/ TWL LRG LVL3 (GOWN DISPOSABLE) ×6 IMPLANT
GOWN STRL REUS W/TWL LRG LVL3 (GOWN DISPOSABLE) ×12
KIT BASIN OR (CUSTOM PROCEDURE TRAY) ×2 IMPLANT
KIT ROOM TURNOVER OR (KITS) ×2 IMPLANT
LEGGING LITHOTOMY PAIR STRL (DRAPES) IMPLANT
LIGASURE IMPACT 36 18CM CVD LR (INSTRUMENTS) ×1 IMPLANT
NS IRRIG 1000ML POUR BTL (IV SOLUTION) ×4 IMPLANT
PACK GENERAL/GYN (CUSTOM PROCEDURE TRAY) ×2 IMPLANT
PAD ARMBOARD 7.5X6 YLW CONV (MISCELLANEOUS) ×2 IMPLANT
PENCIL BUTTON HOLSTER BLD 10FT (ELECTRODE) ×2 IMPLANT
RELOAD PROXIMATE 75MM BLUE (ENDOMECHANICALS) ×6 IMPLANT
RELOAD STAPLE 75 3.8 BLU REG (ENDOMECHANICALS) IMPLANT
SPECIMEN JAR X LARGE (MISCELLANEOUS) ×2 IMPLANT
SPONGE LAP 18X18 X RAY DECT (DISPOSABLE) IMPLANT
STAPLER GUN LINEAR PROX 60 (STAPLE) ×1 IMPLANT
STAPLER PROXIMATE 75MM BLUE (STAPLE) ×1 IMPLANT
STAPLER VISISTAT 35W (STAPLE) ×2 IMPLANT
SUCTION POOLE TIP (SUCTIONS) ×2 IMPLANT
SURGILUBE 2OZ TUBE FLIPTOP (MISCELLANEOUS) IMPLANT
SUT PDS AB 1 TP1 96 (SUTURE) ×4 IMPLANT
SUT PROLENE 2 0 CT2 30 (SUTURE) IMPLANT
SUT PROLENE 2 0 KS (SUTURE) IMPLANT
SUT SILK 2 0 SH CR/8 (SUTURE) ×3 IMPLANT
SUT SILK 2 0 TIES 10X30 (SUTURE) ×2 IMPLANT
SUT SILK 3 0 SH CR/8 (SUTURE) ×2 IMPLANT
SUT SILK 3 0 TIES 10X30 (SUTURE) ×2 IMPLANT
SYR BULB IRRIGATION 50ML (SYRINGE) ×2 IMPLANT
TOWEL OR 17X24 6PK STRL BLUE (TOWEL DISPOSABLE) ×1 IMPLANT
TOWEL OR 17X26 10 PK STRL BLUE (TOWEL DISPOSABLE) ×4 IMPLANT
TRAY FOLEY CATH 14FRSI W/METER (CATHETERS) ×1 IMPLANT
TRAY PROCTOSCOPIC FIBER OPTIC (SET/KITS/TRAYS/PACK) IMPLANT
TUBE CONNECTING 12X1/4 (SUCTIONS) ×2 IMPLANT
UNDERPAD 30X30 INCONTINENT (UNDERPADS AND DIAPERS) ×1 IMPLANT
WATER STERILE IRR 1000ML POUR (IV SOLUTION) IMPLANT
YANKAUER SUCT BULB TIP NO VENT (SUCTIONS) ×2 IMPLANT

## 2014-09-18 NOTE — Care Management Note (Unsigned)
    Page 1 of 1   09/18/2014     6:11:44 PM CARE MANAGEMENT NOTE 09/18/2014  Patient:  Catherine Rogers, Catherine Rogers   Account Number:  [id number]  Date Initiated:  09/18/2014  Documentation initiated by:  Tomi Bamberger  Subjective/Objective Assessment:   dx gib  admit- lives with spouse.     Action/Plan:   10/26 s/p right hemi colectomy, ileocolostomy   Anticipated DC Date:  09/19/2014   Anticipated DC Plan:  [place]  CM consult      Choice offered to / List presented to:             Status of service:  In process, will continue to follow Medicare Important Message given?   (If response is "NO", the following Medicare IM given date fields will be blank) Date Medicare IM given:   Medicare IM given by:   Date Additional Medicare IM given:   Additional Medicare IM given by:    Discharge Disposition:    Per UR Regulation:  Reviewed for med. necessity/level of care/duration of stay  If discussed at [place] of Stay Meetings, dates discussed:    Comments:  09/18/14 [place] ,BSN [phone] patient is s/p hemi colectomy, NCM will continue to follow for dc needs.

## 2014-09-18 NOTE — Progress Notes (Addendum)
PROGRESS NOTE  Catherine Rogers [id number]:[id number] DOB: 1961-07-12 DOA: 09/16/2014 PCP: No PCP Per Patient  HPI: Catherine Rogers is a 53 y.o. female has a past medical history significant for chronic pain due to arthritis, HTN, marijuana use, coming from [facility] with concerns for GI bleed and without GI services available over there.   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 Tiny hiatal hernia 2. Antritis 3. Otherwise within normal limits EGD   Antibiotics None   Studies None   Objective  Filed Vitals:   09/17/14 1826 09/17/14 2115 09/18/14 0026 09/18/14 0616  BP: 182/70 148/76 134/87 174/86  Pulse: 67 68 77 71  Temp: 98.7 F (37.1 C) 98.3 F (36.8 C) 98.6 F (37 C) 98.8 F (37.1 C)  TempSrc: Oral Oral Oral Oral  Resp: 16 16 16 16   Height:      Weight:      SpO2: 100% 100% 100% 98%    Intake/Output Summary (Last 24 hours) at 09/18/14 0907 Last data filed at 09/17/14 1153  Gross per 24 hour  Intake    100 ml  Output      0 ml  Net    100 ml   Filed Weights   09/16/14 1649 09/17/14 0824  Weight: 67.45 kg (148 lb 11.2 oz) 67.45 kg (148 lb 11.2 oz)    Exam:  General:  NAD  Cardiovascular: RRR  Respiratory: CTA biL  Abdomen: diffusely tender  MSK: no edema  Data Reviewed: Basic Metabolic Panel:  Recent Labs Lab 09/16/14 1815  NA 139  K 3.7  CL 102  CO2 23  GLUCOSE 102*  BUN 8  CREATININE 0.86  CALCIUM 9.1   Liver Function Tests:  Recent Labs Lab 09/16/14 1815  AST 17  ALT 8  ALKPHOS 92  BILITOT 0.4  PROT 7.6  ALBUMIN 3.7    Recent Labs Lab 09/16/14 1815  LIPASE 14   CBC:  Recent Labs Lab 09/16/14 1815 09/17/14 0053 09/17/14 0920 09/18/14 0437  WBC 5.8 5.4 5.6 6.9  HGB 11.7* 10.1* 10.1* 9.7*  HCT 32.3* 27.9* 27.4* 26.5*  MCV 97.3 95.2 95.1 96.7  PLT 168 164 141* 161   Scheduled Meds: . folic acid  1 mg Oral Daily  . lisinopril  5 mg Oral Daily  . multivitamin with minerals  1 tablet Oral Daily  . pantoprazole  40 mg Oral BID AC  . thiamine  100 mg Oral Daily   Or  . thiamine  100 mg Intravenous Daily   Continuous Infusions:   Time spent: 25 minutes  Marzetta Board, MD [facility] Pager [phone]. If 7 PM - 7 AM, please contact night-coverage at [email or web address], password  [email or web address] 09/18/2014, 9:01 AM  LOS: 2 days

## 2014-09-18 NOTE — Anesthesia Preprocedure Evaluation (Addendum)
Anesthesia Evaluation  Patient identified by MRN, date of birth, ID band Patient awake    Reviewed: Allergy & Precautions, H&P , NPO status , Patient's Chart, lab work & pertinent test results  History of Anesthesia Complications Negative for: history of anesthetic complications  Airway Mallampati: II  TM Distance: >3 FB Neck ROM: Full    Dental  (+) Chipped, Missing, Poor Dentition, Dental Advisory Given   Pulmonary COPDRecent URI , Residual Cough, Current Smoker,  breath sounds clear to auscultation        Cardiovascular hypertension, Pt. on medications - anginaRhythm:Regular Rate:Normal     Neuro/Psych negative neurological ROS     GI/Hepatic GERD-  Medicated and Controlled,(+)     substance abuse  alcohol use and marijuana use,   Endo/Other  negative endocrine ROS  Renal/GU negative Renal ROS     Musculoskeletal   Abdominal   Peds  Hematology  (+) Blood dyscrasia (Hb 9.7), anemia ,   Anesthesia Other Findings   Reproductive/Obstetrics                           Anesthesia Physical Anesthesia Plan  ASA: III  Anesthesia Plan: General   Post-op Pain Management:    Induction: Intravenous  Airway Management Planned: Oral ETT  Additional Equipment:   Intra-op Plan:   Post-operative Plan: Extubation in OR  Informed Consent: I have reviewed the patients History and Physical, chart, labs and discussed the procedure including the risks, benefits and alternatives for the proposed anesthesia with the patient or authorized representative who has indicated his/her understanding and acceptance.   Dental advisory given  Plan Discussed with: CRNA and Surgeon  Anesthesia Plan Comments: (Plan routine monitors, GETA)        Anesthesia Quick Evaluation

## 2014-09-18 NOTE — Transfer of Care (Signed)
Immediate Anesthesia Transfer of Care Note  Patient: Catherine Rogers  Procedure(s) Performed: Procedure(s): RIGHT HEMI COLECTOMY (Right) ILEOCOLOSTOMY (Right)  Patient Location: PACU  Anesthesia Type:General  Level of Consciousness: awake, alert , oriented and patient cooperative  Airway & Oxygen Therapy: Patient Spontanous Breathing and Patient connected to nasal cannula oxygen  Post-op Assessment: Report given to PACU RN and Post -op Vital signs reviewed and stable  Post vital signs: Reviewed and stable  Complications: No apparent anesthesia complications

## 2014-09-18 NOTE — Progress Notes (Signed)
[facility] PROGRESS NOTE Subjective Pt still c/o pain and passing some blood, states she is having some flatus.  Objective: Vital signs in last 24 hours: Temp:  [98.3 F (36.8 C)-98.8 F (37.1 C)] 98.8 F (37.1 C) (10/26 0616) Pulse Rate:  [64-77] 71 (10/26 0616) Resp:  [16-19] 16 (10/26 0616) BP: (132-201)/(67-117) 174/86 mmHg (10/26 0616) SpO2:  [98 %-100 %] 98 % (10/26 0616) Last BM Date: 09/18/14  Intake/Output from previous day: 10/25 0701 - 10/26 0700 In: 306.7 [I.V.:306.7] Out: -  Intake/Output this shift:    PE: General--pt alert crying  Abdomen--minimally distended, few BSs, tender primarily RLQ but also diffuse  Lab Results:  Recent Labs  09/16/14 1815 09/17/14 0053 09/17/14 0920 09/18/14 0437  WBC 5.8 5.4 5.6 6.9  HGB 11.7* 10.1* 10.1* 9.7*  HCT 32.3* 27.9* 27.4* 26.5*  PLT 168 164 141* 161   BMET  Recent Labs  09/16/14 1815  NA 139  K 3.7  CL 102  CO2 23  CREATININE 0.86   LFT  Recent Labs  09/16/14 1815  PROT 7.6  AST 17  ALT 8  ALKPHOS 92  BILITOT 0.4   PT/INR  Recent Labs  09/16/14 1815  LABPROT 13.9  INR 1.06   PANCREAS  Recent Labs  09/16/14 1815  LIPASE 14         Studies/Results: Ct Abdomen Pelvis W Contrast  09/17/2014   CLINICAL DATA:  Abdominal pain, blood in stool  EXAM: CT ABDOMEN AND PELVIS WITH CONTRAST  TECHNIQUE: Multidetector CT imaging of the abdomen and pelvis was performed using the standard protocol following bolus administration of intravenous contrast.  CONTRAST:  172mL OMNIPAQUE IOHEXOL 300 MG/ML  SOLN  COMPARISON:  08/04/2012  FINDINGS: Motion degraded images.  Lower chest:  Lung bases are clear.  Hepatobiliary: Liver is within normal limits.  Gallbladder is unremarkable. No intrahepatic or extrahepatic ductal dilatation.  Pancreas: Within normal limits.  Spleen: Within normal limits.  Adrenals/Urinary Tract: Adrenal glands are unremarkable.  Kidneys are within normal limits.  No  hydronephrosis.  Bladder is underdistended.  Stomach/Bowel: Stomach is unremarkable.  3.8 x 2.7 cm cecal mass (series 3/image 63), highly suspicious for cecal adenocarcinoma. Associated ileocecal intussusception.  No evidence of bowel obstruction.  Vascular/Lymphatic: Atherosclerotic calcifications of the abdominal aorta and branch vessels. 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  8 mm short axis left para-aortic node (series 3/ image 31), within the upper limits of normal. Otherwise, no suspicious abdominopelvic lymphadenopathy.  Reproductive: Status post hysterectomy.  Bilateral ovaries are unremarkable.  Other: 9.1 x 7.1 cm solid and cystic mass in the left posterior pelvis with associated 3.5 cm enhancing solid component, favored to reflect a peritoneal metastasis.  Associated small volume pelvic ascites.  Musculoskeletal: Degenerative changes of the visualized thoracolumbar spine.  Chronic left hip AVN.  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 Associated ileocecal intussusception.  9.1 cm solid and cystic mass in the left pelvis, favored to reflect a peritoneal metastasis. Associated small volume pelvic ascites.  8 mm short axis left para-aortic node, within the upper limits of normal.  No evidence of bowel obstruction.   Electronically Signed   By: Julian Hy M.D.   On: 09/17/2014 21:02    Medications: I have reviewed the patient's current medications.  Assessment/Plan: 1. Abd Pain/Rectal Bleeding. CT shows solid cecal mass and cystic mass ? Bowel, ? Intussusception, ? If she has partially obstructed bowel.  Have  asked surgery to see. We will be happy to do colon if would be helpful, but am concerned about trying to prep her. Suspect that she will need surgery . She is NPO in case   [place] Paiz JR,Ashad Fawbush L 09/18/2014, 8:38 AM

## 2014-09-18 NOTE — Anesthesia Postprocedure Evaluation (Signed)
  Anesthesia Post-op Note  Patient: Catherine Rogers  Procedure(s) Performed: Procedure(s): RIGHT HEMI COLECTOMY (Right) ILEOCOLOSTOMY (Right)  Patient Location: PACU  Anesthesia Type:General  Level of Consciousness: awake, alert , oriented and patient cooperative  Airway and Oxygen Therapy: Patient Spontanous Breathing and Patient connected to nasal cannula oxygen  Post-op Pain: mild  Post-op Assessment: Post-op Vital signs reviewed, Patient's Cardiovascular Status Stable, Respiratory Function Stable, Patent Airway, No signs of Nausea or vomiting and Adequate PO intake  Post-op Vital Signs: Reviewed and stable  Last Vitals:  Filed Vitals:   09/18/14 1600  BP: 176/97  Pulse: 62  Temp: 36.1 C  Resp: 16    Complications: No apparent anesthesia complications
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
## 2014-09-18 NOTE — Progress Notes (Signed)
Report given to OR RN Mickel Baas.

## 2014-09-18 NOTE — Consult Note (Signed)
Patient with excruciating right sided abdominal pain and rectal bleeding.  CT demonstrates possible intussusception of the right colon.  Peritonitis.  Possible mass.  Needs surgical intervention ASAP.  Right colectomy with possible ileostomy.  Kathryne Eriksson. Dahlia Bailiff, MD, [place] [phone] [facility] Surgery

## 2014-09-18 NOTE — Op Note (Addendum)
OPERATIVE REPORT  DATE OF OPERATION:  09/18/2014  PATIENT:  Catherine Rogers  53 y.o. 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  ANESTHESIA:   general  EBL: <100 ml  BLOOD ADMINISTERED: none  DRAINS: Nasogastric Tube and Urinary Catheter (Foley)   SPECIMEN:  Source of Specimen:  Ileum, Right colon and right transverse colon  COUNTS CORRECT:  YES  PROCEDURE DETAILS: The patient was taken to the operating room and placed on the table in supine position. After an adequate general endotracheal anesthetic was administered she was prepped and draped in the usual sterile manner exposing her entire abdomen.  A proper timeout was performed identifying the patient and the procedure to be performed. A midline incision was made using a #10 blade and taken down to the left of the umbilicus and below the bellybutton. A total incision size is approximately 12 cm long. We took it down to and through the midline fascia using electrocautery. Once this was done we entered the peritoneal cavity where we could easily palpate a large firm structure in the mid right upper quadrant of the abdomen. There was intussusception of the terminal ileum into the cecum and the right transverse colon. This could not be reduced manually in the operating room.  A right hemicolectomy was performed by coming across the mid to right transverse colon using a GIA-75 stapler and the terminal ileum using the same type of stapler. The mesentery was taken between the 2 transected ends using a LigaSure device. The ileocolic vessels was tied off using a 2-0 silk tie. Once we completed the resection the specimen was taken off the field and an  anastomosis was made between the terminal ileum and the transverse colon using a GIA-75 stapler. The resulting enterotomy was closed using a TX 60 stapler. The mesentery was closed using interrupted 2-0 silk sutures.  I examined the spedimen off the filed, and the lead point for the intussusception was a mass in the cecum, likely a cancer.  We irrigated the abdomen with saline solution approximately 1-1/2 L were used. The specimen was opened off the field and it demonstrated a large tumor at the cecum which acted as a leading point for the intussusception.  Once the abdomen was irrigated with closed using looped #1 PDS suture. We irrigated subcutaneous tissue using saline and closed the skin using stainless steel staples. All needle counts, sponge counts, and instrument counts were correct. A sterile dressing was applied.  PATIENT DISPOSITION:  PACU - hemodynamically stable.   Analie Katzman, JAY 10/26/20152:00 PM

## 2014-09-19 ENCOUNTER — Encounter (HOSPITAL_COMMUNITY): Payer: Self-pay | Admitting: General Surgery

## 2014-09-19 LAB — BASIC METABOLIC PANEL
Anion gap: 11 (ref 5–15)
BUN: 8 mg/dL (ref 6–23)
CO2: 26 meq/L (ref 19–32)
Calcium: 8.1 mg/dL — ABNORMAL LOW (ref 8.4–10.5)
Chloride: 100 mEq/L (ref 96–112)
Creatinine, Ser: 0.71 mg/dL (ref 0.50–1.10)
GFR calc Af Amer: >90 mL/min (ref >90)
Glucose, Bld: 171 mg/dL — ABNORMAL HIGH (ref 70–99)
Potassium: 3.6 mEq/L — ABNORMAL LOW (ref 3.7–5.3)
Sodium: 137 mEq/L (ref 137–147)

## 2014-09-19 LAB — CBC
HEMATOCRIT: 27.4 % — AB (ref 36.0–46.0)
Hemoglobin: 9.9 g/dL — ABNORMAL LOW (ref 12.0–15.0)
MCH: 34.4 pg — AB (ref 26.0–34.0)
MCHC: 36.1 g/dL — AB (ref 30.0–36.0)
MCV: 95.1 fL (ref 78.0–100.0)
Platelets: 148 10*3/uL — ABNORMAL LOW (ref 150–400)
RBC: 2.88 MIL/uL — AB (ref 3.87–5.11)
RDW: 11.7 % (ref 11.5–15.5)
WBC: 5.8 10*3/uL (ref 4.0–10.5)

## 2014-09-19 MED ORDER — POTASSIUM CHLORIDE 10 MEQ/100ML IV SOLN
10.0000 meq | INTRAVENOUS | Status: AC
  Administered 2014-09-19 (×3): 10 meq via INTRAVENOUS
  Filled 2014-09-19 (×2): qty 100

## 2014-09-19 MED ORDER — DIPHENHYDRAMINE HCL 50 MG/ML IJ SOLN: 12.5000 mg | Freq: Four times a day (QID) | INTRAMUSCULAR | Status: DC | PRN

## 2014-09-19 MED ORDER — NALOXONE HCL 0.4 MG/ML IJ SOLN: 0.4000 mg | INTRAMUSCULAR | Status: DC | PRN

## 2014-09-19 MED ORDER — HYDROCHLOROTHIAZIDE 25 MG PO TABS
25.0000 mg | ORAL_TABLET | Freq: Every day | ORAL | Status: DC
  Administered 2014-09-19 – 2014-09-21 (×3): 25 mg via ORAL
  Filled 2014-09-19 (×4): qty 1

## 2014-09-19 MED ORDER — HYDROMORPHONE 0.3 MG/ML IV SOLN
INTRAVENOUS | Status: DC
  Administered 2014-09-19 (×3): 0.3 mg via INTRAVENOUS
  Administered 2014-09-19: 1.99 mg via INTRAVENOUS
  Administered 2014-09-20: 0.3 mg via INTRAVENOUS
  Administered 2014-09-20: 0.799 mg via INTRAVENOUS
  Administered 2014-09-20: 0.3 mg via INTRAVENOUS
  Administered 2014-09-20: 1.19 mg via INTRAVENOUS
  Administered 2014-09-20: 0.799 mg via INTRAVENOUS
  Administered 2014-09-20: 0.3 mg via INTRAVENOUS
  Administered 2014-09-21 (×2): 0.4 mg via INTRAVENOUS
  Administered 2014-09-21: 0.1 mg via INTRAVENOUS
  Filled 2014-09-19: qty 25

## 2014-09-19 MED ORDER — ONDANSETRON HCL 4 MG/2ML IJ SOLN: 4.0000 mg | Freq: Four times a day (QID) | INTRAMUSCULAR | Status: DC | PRN

## 2014-09-19 MED ORDER — SODIUM CHLORIDE 0.9 % IJ SOLN: 9.0000 mL | INTRAMUSCULAR | Status: DC | PRN

## 2014-09-19 MED ORDER — DIPHENHYDRAMINE HCL 12.5 MG/5ML PO ELIX: 12.5000 mg | ORAL_SOLUTION | Freq: Four times a day (QID) | ORAL | Status: DC | PRN

## 2014-09-19 NOTE — Progress Notes (Signed)
Agree with everything that was stated.  Urine output has been good.  NGT can come out.    Kathryne Eriksson. Dahlia Bailiff, MD, [place] [phone] [facility] Surgery

## 2014-09-19 NOTE — Progress Notes (Signed)
PROGRESS NOTE  Catherine Rogers ERD:[id number] DOB: Aug 12, 1961 DOA: 09/16/2014 PCP: No PCP Per Patient  HPI: Catherine Rogers is a 53 y.o. female has a past medical history significant for chronic pain due to arthritis, HTN, marijuana use, coming from [facility] with concerns for GI bleed and without GI services available over there.   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 Appreciate surgical team continuing care and they will be primary. Please call hospitalist team with any questions.  Diet: NPO Fluids: none DVT Prophylaxis: SCD  Code Status: Full Family Communication: none  Disposition Plan: home when ready   Consultants:  GI  Surgery   Procedures:  EGD 10/25 ENDOSCOPIC IMPRESSION: 1. Tiny hiatal hernia 2. Antritis 3. Otherwise within normal limits EGD   Antibiotics None   Studies None   Objective  Filed Vitals:   09/19/14 0000 09/19/14 0135 09/19/14 0400 09/19/14 0536  BP:  148/85  141/69  Pulse:  62  59  Temp:  98.3 F (36.8 C)  98.4 F (36.9 C)  TempSrc:  Oral  Oral  Resp: 10 11 11 12   Height:      Weight:      SpO2: 100% 100% 100% 100%    Intake/Output Summary (Last 24 hours) at 09/19/14 0700 Last data filed at 09/19/14 0534  Gross per 24 hour  Intake 2402.92 ml  Output    815 ml  Net 1587.92 ml    Filed Weights   09/16/14 1649 09/17/14 0824  Weight: 67.45 kg (148 lb 11.2 oz) 67.45 kg (148 lb 11.2 oz)   Exam:  General:  NAD  Cardiovascular: RRR  Respiratory: CTA biL  Abdomen: diffusely tender  MSK: no edema  Data Reviewed: Basic Metabolic Panel:  Recent Labs Lab 09/16/14 1815 09/18/14 1839 09/19/14 0434  NA 139  --  137  K 3.7  --  3.6*  CL 102  --  100  CO2 23  --  26  GLUCOSE 102*  --  171*  BUN 8  --  8  CREATININE 0.86 0.70 0.71  CALCIUM 9.1  --  8.1*   Liver Function Tests:  Recent Labs Lab 09/16/14 1815  AST 17  ALT 8  ALKPHOS 92  BILITOT 0.4  PROT 7.6  ALBUMIN 3.7    Recent Labs Lab 09/16/14 1815  LIPASE 14   CBC:  Recent Labs Lab 09/17/14 0053 09/17/14 0920 09/18/14 0437 09/18/14 1839 09/19/14 0434  WBC 5.4 5.6 6.9 7.9 5.8  HGB 10.1* 10.1* 9.7* 10.4* 9.9*  HCT 27.9* 27.4* 26.5* 28.7* 27.4*  MCV 95.2 95.1 96.7 95.0 95.1  PLT 164 141* 161 153 148*   Scheduled Meds: . cefTRIAXone (ROCEPHIN)  IV  2 g Intravenous Once  . enoxaparin (LOVENOX) injection  40 mg Subcutaneous Q24H  . folic acid  1 mg Oral Daily  . HYDROmorphone PCA  0.3 mg/mL   Intravenous 6 times per day  . thiamine  100 mg Oral Daily   Or  . thiamine  100 mg Intravenous Daily   Continuous Infusions: . dextrose 5 % and 0.45 % NaCl with KCl 20 mEq/L 125 mL/hr at 09/19/14 0406   Time spent: 15 minutes  Marzetta Board, MD [facility] Pager [phone]. If 7 PM - 7 AM, please contact night-coverage at [email or web address], password [facility] 09/19/2014, 7:00 AM  LOS: 3 days

## 2014-09-19 NOTE — Progress Notes (Signed)
[facility] PROGRESS NOTE Subjective OP findings noted. Expected post op pain  Objective: Vital signs in last 24 hours: Temp:  [97 F (36.1 C)-98.4 F (36.9 C)] 98.4 F (36.9 C) (10/27 0536) Pulse Rate:  [59-101] 59 (10/27 0536) Resp:  [8-17] 10 (10/27 0800) BP: (141-223)/(69-117) 141/69 mmHg (10/27 0536) SpO2:  [100 %] 100 % (10/27 0800) FiO2 (%):  [100 %] 100 % (10/27 0800) Last BM Date: 09/18/14  Intake/Output from previous day: 10/26 0701 - 10/27 0700 In: 2402.9 [I.V.:2272.9; NG/GT:30; IV Piggyback:100] Out: 815 [Urine:640; Emesis/NG output:75; Blood:100] Intake/Output this shift:      Lab Results:  Recent Labs  09/17/14 0053 09/17/14 0920 09/18/14 0437 09/18/14 1839 09/19/14 0434  WBC 5.4 5.6 6.9 7.9 5.8  HGB 10.1* 10.1* 9.7* 10.4* 9.9*  HCT 27.9* 27.4* 26.5* 28.7* 27.4*  PLT 164 141* 161 153 148*   BMET  Recent Labs  09/16/14 1815 09/18/14 1839 09/19/14 0434  NA 139  --  137  K 3.7  --  3.6*  CL 102  --  100  CO2 23  --  26  CREATININE 0.86 0.70 0.71   LFT  Recent Labs  09/16/14 1815  PROT 7.6  AST 17  ALT 8  ALKPHOS 92  BILITOT 0.4   PT/INR  Recent Labs  09/16/14 1815  LABPROT 13.9  INR 1.06   PANCREAS  Recent Labs  09/16/14 1815  LIPASE 14         Studies/Results: Ct Abdomen Pelvis W Contrast  09/17/2014   CLINICAL DATA:  Abdominal pain, blood in stool  EXAM: CT ABDOMEN AND PELVIS WITH CONTRAST  TECHNIQUE: Multidetector CT imaging of the abdomen and pelvis was performed using the standard protocol following bolus administration of intravenous contrast.  CONTRAST:  149mL OMNIPAQUE IOHEXOL 300 MG/ML  SOLN  COMPARISON:  08/04/2012  FINDINGS: Motion degraded images.  Lower chest:  Lung bases are clear.  Hepatobiliary: Liver is within normal limits.  Gallbladder is unremarkable. No intrahepatic or extrahepatic ductal dilatation.  Pancreas: Within normal limits.  Spleen: Within normal limits.  Adrenals/Urinary Tract:  Adrenal glands are unremarkable.  Kidneys are within normal limits.  No hydronephrosis.  Bladder is underdistended.  Stomach/Bowel: Stomach is unremarkable.  3.8 x 2.7 cm cecal mass (series 3/image 63), highly suspicious for cecal adenocarcinoma. Associated ileocecal intussusception.  No evidence of bowel obstruction.  Vascular/Lymphatic: Atherosclerotic calcifications of the abdominal aorta and branch vessels. 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  8 mm short axis left para-aortic node (series 3/ image 31), within the upper limits of normal. Otherwise, no suspicious abdominopelvic lymphadenopathy.  Reproductive: Status post hysterectomy.  Bilateral ovaries are unremarkable.  Other: 9.1 x 7.1 cm solid and cystic mass in the left posterior pelvis with associated 3.5 cm enhancing solid component, favored to reflect a peritoneal metastasis.  Associated small volume pelvic ascites.  Musculoskeletal: Degenerative changes of the visualized thoracolumbar spine.  Chronic left hip AVN.  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 Associated ileocecal intussusception.  9.1 cm solid and cystic mass in the left pelvis, favored to reflect a peritoneal metastasis. Associated small volume pelvic ascites.  8 mm short axis left para-aortic node, within the upper limits of normal.  No evidence of bowel obstruction.   Electronically Signed   By: Julian Hy M.D.   On: 09/17/2014 21:02   Dg Chest Port 1 View  09/18/2014   CLINICAL DATA:  Preoperative evaluation, history  of prior tobacco use  EXAM: PORTABLE CHEST - 1 VIEW  COMPARISON:  None.  FINDINGS: The heart size and mediastinal contours are within normal limits. Both lungs are clear. The visualized skeletal structures are unremarkable.  IMPRESSION: No active disease.   Electronically Signed   By: Inez Catalina M.D.   On: 09/18/2014 10:56    Medications: I have reviewed the  patient's current medications.  Assessment/Plan: 1. Cecal Cancer with intussusception. Pt will need colonoscopy in future will tentatively plan  About 6 months since the rest of the colon hasn't been seen. Discussed with the pt.   Lucindia Lemley JR,Jenella Craigie L 09/19/2014, 9:31 AM

## 2014-09-19 NOTE — Progress Notes (Signed)
Patient ID: Catherine Rogers, female   DOB: 19-Jul-1961, 53 y.o.   MRN: [id number] 1 Day Post-Op  Subjective: Pt states pain isn't well controlled.  She isn't passing flatus yet.  NGT with only 75cc out since surgery.  Objective: Vital signs in last 24 hours: Temp:  [97 F (36.1 C)-98.4 F (36.9 C)] 98.4 F (36.9 C) (10/27 0536) Pulse Rate:  [59-101] 59 (10/27 0536) Resp:  [8-17] 10 (10/27 0800) BP: (141-223)/(69-117) 141/69 mmHg (10/27 0536) SpO2:  [100 %] 100 % (10/27 0800) FiO2 (%):  [100 %] 100 % (10/27 0800) Last BM Date: 09/18/14  Intake/Output from previous day: 10/26 0701 - 10/27 0700 In: 2402.9 [I.V.:2272.9; NG/GT:30; IV Piggyback:100] Out: 29 [Urine:640; Emesis/NG output:75; Blood:100] Intake/Output this shift:    PE: Abd: soft, appropriately tender, incision is c/d/i, betadine present on honeycomb dressing, few BS, ND, NGT with minimal output.  Lab Results:   Recent Labs  09/18/14 1839 09/19/14 0434  WBC 7.9 5.8  HGB 10.4* 9.9*  HCT 28.7* 27.4*  PLT 153 148*   BMET  Recent Labs  09/16/14 1815 09/18/14 1839 09/19/14 0434  NA 139  --  137  K 3.7  --  3.6*  CL 102  --  100  CO2 23  --  26  GLUCOSE 102*  --  171*  BUN 8  --  8  CREATININE 0.86 0.70 0.71  CALCIUM 9.1  --  8.1*   PT/INR  Recent Labs  09/16/14 1815  LABPROT 13.9  INR 1.06   CMP     Component Value Date/Time   NA 137 09/19/2014 0434   K 3.6* 09/19/2014 0434   CL 100 09/19/2014 0434   CO2 26 09/19/2014 0434   GLUCOSE 171* 09/19/2014 0434   BUN 8 09/19/2014 0434   CREATININE 0.71 09/19/2014 0434   CALCIUM 8.1* 09/19/2014 0434   PROT 7.6 09/16/2014 1815   ALBUMIN 3.7 09/16/2014 1815   AST 17 09/16/2014 1815   ALT 8 09/16/2014 1815   ALKPHOS 92 09/16/2014 1815   BILITOT 0.4 09/16/2014 1815   GFRNONAA >90 09/19/2014 0434   GFRAA >90 09/19/2014 0434   Lipase     Component Value Date/Time   LIPASE 14 09/16/2014 1815       Studies/Results: Ct Abdomen Pelvis W  Contrast  09/17/2014   CLINICAL DATA:  Abdominal pain, blood in stool  EXAM: CT ABDOMEN AND PELVIS WITH CONTRAST  TECHNIQUE: Multidetector CT imaging of the abdomen and pelvis was performed using the standard protocol following bolus administration of intravenous contrast.  CONTRAST:  130mL OMNIPAQUE IOHEXOL 300 MG/ML  SOLN  COMPARISON:  08/04/2012  FINDINGS: Motion degraded images.  Lower chest:  Lung bases are clear.  Hepatobiliary: Liver is within normal limits.  Gallbladder is unremarkable. No intrahepatic or extrahepatic ductal dilatation.  Pancreas: Within normal limits.  Spleen: Within normal limits.  Adrenals/Urinary Tract: Adrenal glands are unremarkable.  Kidneys are within normal limits.  No hydronephrosis.  Bladder is underdistended.  Stomach/Bowel: Stomach is unremarkable.  3.8 x 2.7 cm cecal mass (series 3/image 63), highly suspicious for cecal adenocarcinoma. Associated ileocecal intussusception.  No evidence of bowel obstruction.  Vascular/Lymphatic: Atherosclerotic calcifications of the abdominal aorta and branch vessels. 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  8 mm short axis left para-aortic node (series 3/ image 31), within the upper limits of normal. Otherwise, no suspicious abdominopelvic lymphadenopathy.  Reproductive: Status post hysterectomy.  Bilateral ovaries are unremarkable.  Other:  9.1 x 7.1 cm solid and cystic mass in the left posterior pelvis with associated 3.5 cm enhancing solid component, favored to reflect a peritoneal metastasis.  Associated small volume pelvic ascites.  Musculoskeletal: Degenerative changes of the visualized thoracolumbar spine.  Chronic left hip AVN.  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 Associated ileocecal intussusception.  9.1 cm solid and cystic mass in the left pelvis, favored to reflect a peritoneal metastasis. Associated small volume pelvic ascites.  8 mm  short axis left para-aortic node, within the upper limits of normal.  No evidence of bowel obstruction.   Electronically Signed   By: Julian Hy M.D.   On: 09/17/2014 21:02   Dg Chest Port 1 View  09/18/2014   CLINICAL DATA:  Preoperative evaluation, history of prior tobacco use  EXAM: PORTABLE CHEST - 1 VIEW  COMPARISON:  None.  FINDINGS: The heart size and mediastinal contours are within normal limits. Both lungs are clear. The visualized skeletal structures are unremarkable.  IMPRESSION: No active disease.   Electronically Signed   By: Inez Catalina M.D.   On: 09/18/2014 10:56    Anti-infectives: Anti-infectives   Start     Dose/Rate Route Frequency Ordered Stop   09/19/14 1200  cefTRIAXone (ROCEPHIN) 2 g in dextrose 5 % 50 mL IVPB     2 g 100 mL/hr over 30 Minutes Intravenous  Once 09/18/14 1807     09/18/14 2000  metroNIDAZOLE (FLAGYL) IVPB 500 mg     500 mg 100 mL/hr over 60 Minutes Intravenous Every 8 hours 09/18/14 1807 09/19/14 0507   09/18/14 1000  [MAR Hold]  cefTRIAXone (ROCEPHIN) 1 g in dextrose 5 % 50 mL IVPB     (On MAR Hold since 09/18/14 1103)   1 g 100 mL/hr over 30 Minutes Intravenous On call to O.R. 09/18/14 0937 09/18/14 1231   09/18/14 1000  [MAR Hold]  metroNIDAZOLE (FLAGYL) IVPB 500 mg     (On MAR Hold since 09/18/14 1103)   500 mg 100 mL/hr over 60 Minutes Intravenous On call to O.R. 09/18/14 5916 09/18/14 1223       Assessment/Plan  1. POD 1, s/p ex lap with ileocecectomy for intussusception with cecal mass 2. HTN 3. hypokalemia  Plan: 1. DC NGT today and restart oral antihypertensive. 2. Change PCA to full dose for better pain control 3. Dc foley today 4. Mobilize and pulm toilet 5. Cont NPO x ice and sips with meds 6. Check bmet tomorrow after replacing K today    LOS: 3 days    Dyan Creelman E 09/19/2014, 9:22 AM Pager: [phone]

## 2014-09-20 LAB — BASIC METABOLIC PANEL
ANION GAP: 10 (ref 5–15)
BUN: 4 mg/dL — ABNORMAL LOW (ref 6–23)
CHLORIDE: 99 meq/L (ref 96–112)
CO2: 27 meq/L (ref 19–32)
Calcium: 8.3 mg/dL — ABNORMAL LOW (ref 8.4–10.5)
Creatinine, Ser: 0.77 mg/dL (ref 0.50–1.10)
GFR calc Af Amer: >90 mL/min (ref >90)
GFR calc non Af Amer: >90 mL/min (ref >90)
GLUCOSE: 107 mg/dL — AB (ref 70–99)
POTASSIUM: 3.3 meq/L — AB (ref 3.7–5.3)
Sodium: 136 mEq/L — ABNORMAL LOW (ref 137–147)

## 2014-09-20 MED ORDER — POTASSIUM CHLORIDE IN NACL 40-0.9 MEQ/L-% IV SOLN
INTRAVENOUS | Status: DC
  Administered 2014-09-20: 75 mL/h via INTRAVENOUS
  Administered 2014-09-21: 10 mL/h via INTRAVENOUS
  Administered 2014-09-21: 75 mL/h via INTRAVENOUS
  Filled 2014-09-20 (×4): qty 1000

## 2014-09-20 NOTE — Progress Notes (Signed)
2 Days Post-Op  Subjective: States she is feeling good this morning. Pain under good control with PCA. Has been both up to chair and ambulatory. UO good. No N/V. No flatus or BM yet. States she is hungry and would like to eat.   Objective: Vital signs in last 24 hours: Temp:  [98.1 F (36.7 C)-99.1 F (37.3 C)] 99 F (37.2 C) (10/28 0501) Pulse Rate:  [78-102] 78 (10/28 0501) Resp:  [13-20] 14 (10/28 0501) BP: (122-166)/(56-94) 133/56 mmHg (10/28 0501) SpO2:  [96 %-100 %] 98 % (10/28 0501) FiO2 (%):  [0 %-100 %] 100 % (10/27 1600) Last BM Date: 09/14/14  Intake/Output from previous day: 10/27 0701 - 10/28 0700 In: 1695.7 [I.V.:1695.7] Out: 1325 [Urine:1325] Intake/Output this shift:    PE: Card: RRR Pulm: CTA B/L Abd: soft, ND, mild expected incisional tenderness. Incision C/D/I with honeycomb dressing. Few scattered BS.   Lab Results:   Recent Labs  09/18/14 1839 09/19/14 0434  WBC 7.9 5.8  HGB 10.4* 9.9*  HCT 28.7* 27.4*  PLT 153 148*   BMET  Recent Labs  09/19/14 0434 09/20/14 0403  NA 137 136*  K 3.6* 3.3*  CL 100 99  CO2 26 27  GLUCOSE 171* 107*  BUN 8 4*  CREATININE 0.71 0.77  CALCIUM 8.1* 8.3*   PT/INR No results found for this basename: LABPROT, INR,  in the last 72 hours CMP     Component Value Date/Time   NA 136* 09/20/2014 0403   K 3.3* 09/20/2014 0403   CL 99 09/20/2014 0403   CO2 27 09/20/2014 0403   GLUCOSE 107* 09/20/2014 0403   BUN 4* 09/20/2014 0403   CREATININE 0.77 09/20/2014 0403   CALCIUM 8.3* 09/20/2014 0403   PROT 7.6 09/16/2014 1815   ALBUMIN 3.7 09/16/2014 1815   AST 17 09/16/2014 1815   ALT 8 09/16/2014 1815   ALKPHOS 92 09/16/2014 1815   BILITOT 0.4 09/16/2014 1815   GFRNONAA >90 09/20/2014 0403   GFRAA >90 09/20/2014 0403   Lipase     Component Value Date/Time   LIPASE 14 09/16/2014 1815       Studies/Results: Dg Chest Port 1 View  09/18/2014   CLINICAL DATA:  Preoperative evaluation, history of prior  tobacco use  EXAM: PORTABLE CHEST - 1 VIEW  COMPARISON:  None.  FINDINGS: The heart size and mediastinal contours are within normal limits. Both lungs are clear. The visualized skeletal structures are unremarkable.  IMPRESSION: No active disease.   Electronically Signed   By: Inez Catalina M.D.   On: 09/18/2014 10:56    Anti-infectives: Anti-infectives   Start     Dose/Rate Route Frequency Ordered Stop   09/19/14 1200  cefTRIAXone (ROCEPHIN) 2 g in dextrose 5 % 50 mL IVPB     2 g 100 mL/hr over 30 Minutes Intravenous  Once 09/18/14 1807 09/19/14 1316   09/18/14 2000  metroNIDAZOLE (FLAGYL) IVPB 500 mg     500 mg 100 mL/hr over 60 Minutes Intravenous Every 8 hours 09/18/14 1807 09/19/14 0507   09/18/14 1000  [MAR Hold]  cefTRIAXone (ROCEPHIN) 1 g in dextrose 5 % 50 mL IVPB     (On MAR Hold since 09/18/14 1103)   1 g 100 mL/hr over 30 Minutes Intravenous On call to O.R. 09/18/14 0937 09/18/14 1231   09/18/14 1000  [MAR Hold]  metroNIDAZOLE (FLAGYL) IVPB 500 mg     (On MAR Hold since 09/18/14 1103)   500 mg 100 mL/hr over  60 Minutes Intravenous On call to O.R. 09/18/14 9471 09/18/14 1223       Assessment/Plan    LOS: 4 days  1. :POD # 2 from exp lap with right hemicolectomy and ileocolostomy for intussusception secondary to cecal mass. 2. HTN 3. Hypokalemia  Plan:  1. Ambulate as much as tolerated 2. Continue PCA for pain management until bowel function improves. 3. Persistent hypokalemia with mild hyponatremia. Repeat K+ of 3.3 this morning and Na+ of 136. Will  D/C D5 1/2 NS with 20 meq of K+ and change to NS with 1meq of K+ for 1L and repeat BMET on 09/21/2014. ??May need to hold HCTZ or switch to different anti-HTN agent if hypokalemia persists.  4. Will advance diet to clears.  5. HTN under good control with current treatment (HCTZ 25 mg QD). However, this may need to be changed if persistent hypokalemia.     Lahoma Rocker, [facility] 09/20/2014,  8:00 AM Pager: [phone] or [phone]

## 2014-09-20 NOTE — Progress Notes (Signed)
Pathology is pending.  Will go slow with clears, but okay.  Catherine Rogers. Dahlia Bailiff, MD, [place] [phone] [facility], [facility]

## 2014-09-21 LAB — BASIC METABOLIC PANEL
ANION GAP: 11 (ref 5–15)
BUN: 3 mg/dL — ABNORMAL LOW (ref 6–23)
CALCIUM: 9.2 mg/dL (ref 8.4–10.5)
CO2: 28 meq/L (ref 19–32)
Chloride: 99 mEq/L (ref 96–112)
Creatinine, Ser: 0.78 mg/dL (ref 0.50–1.10)
GFR calc non Af Amer: >90 mL/min (ref >90)
Glucose, Bld: 91 mg/dL (ref 70–99)
Potassium: 4.2 mEq/L (ref 3.7–5.3)
Sodium: 138 mEq/L (ref 137–147)

## 2014-09-21 MED ORDER — HYDROMORPHONE HCL 1 MG/ML IJ SOLN
0.5000 mg | INTRAMUSCULAR | Status: DC | PRN
Start: 2014-09-21 — End: 2014-09-22

## 2014-09-21 MED ORDER — DOCUSATE SODIUM 100 MG PO CAPS
100.0000 mg | ORAL_CAPSULE | Freq: Two times a day (BID) | ORAL | Status: DC
  Administered 2014-09-21 (×2): 100 mg via ORAL
  Filled 2014-09-21 (×2): qty 1

## 2014-09-21 MED ORDER — HYDROCODONE-ACETAMINOPHEN 5-325 MG PO TABS
1.0000 | ORAL_TABLET | ORAL | Status: DC | PRN
  Administered 2014-09-21 – 2014-09-22 (×4): 1 via ORAL
  Administered 2014-09-22: 2 via ORAL
  Filled 2014-09-21 (×2): qty 1
  Filled 2014-09-21: qty 2
  Filled 2014-09-21 (×2): qty 1

## 2014-09-21 MED ORDER — METHOCARBAMOL 500 MG PO TABS
1000.0000 mg | ORAL_TABLET | Freq: Three times a day (TID) | ORAL | Status: DC | PRN
  Administered 2014-09-21 (×2): 1000 mg via ORAL
  Filled 2014-09-21 (×2): qty 2

## 2014-09-21 NOTE — Progress Notes (Signed)
Patient just had a bowel movement without blood.  Will advance diet.  Told patient about her pathology.  Will need oncological referral.  Likely will be able to go home tomorrow.  Kathryne Eriksson. Dahlia Bailiff, MD, [place] [phone] [facility]

## 2014-09-21 NOTE — Progress Notes (Signed)
[facility] Progress Note  3 Days Post-Op  Subjective: Pain well controlled.  Wants to d/c PCA - its bothering her.  Nausea and vomiting yesterday, but none today.  Tolerating some clears.  No flatus or BM yet.  Mobilizing some.  IS to 1250.  Objective: Vital signs in last 24 hours: Temp:  [98.5 F (36.9 C)-99.4 F (37.4 C)] 98.8 F (37.1 C) (10/29 0550) Pulse Rate:  [63-88] 67 (10/29 0550) Resp:  [14-20] 17 (10/29 0550) BP: (141-176)/(66-106) 141/79 mmHg (10/29 0550) SpO2:  [98 %-100 %] 98 % (10/29 0550) FiO2 (%):  [100 %] 100 % (10/28 1200) Last BM Date: 09/14/14  Intake/Output from previous day: 10/28 0701 - 10/29 0700 In: 2215.3 [P.O.:720; I.V.:1495.3] Out: 3500 [Urine:3500] Intake/Output this shift:    PE: Gen:  Alert, NAD, pleasant Abd: Soft, mild distension, tenderness over incision site, +BS, no HSM, incisions with sanguinous drainage on honeycomb  Lab Results:   Recent Labs  09/18/14 1839 09/19/14 0434  WBC 7.9 5.8  HGB 10.4* 9.9*  HCT 28.7* 27.4*  PLT 153 148*   BMET  Recent Labs  09/20/14 0403 09/21/14 0336  NA 136* 138  K 3.3* 4.2  CL 99 99  CO2 27 28  GLUCOSE 107* 91  BUN 4* 3*  CREATININE 0.77 0.78  CALCIUM 8.3* 9.2   PT/INR No results found for this basename: LABPROT, INR,  in the last 72 hours CMP     Component Value Date/Time   NA 138 09/21/2014 0336   K 4.2 09/21/2014 0336   CL 99 09/21/2014 0336   CO2 28 09/21/2014 0336   GLUCOSE 91 09/21/2014 0336   BUN 3* 09/21/2014 0336   CREATININE 0.78 09/21/2014 0336   CALCIUM 9.2 09/21/2014 0336   PROT 7.6 09/16/2014 1815   ALBUMIN 3.7 09/16/2014 1815   AST 17 09/16/2014 1815   ALT 8 09/16/2014 1815   ALKPHOS 92 09/16/2014 1815   BILITOT 0.4 09/16/2014 1815   GFRNONAA >90 09/21/2014 0336   GFRAA >90 09/21/2014 0336   Lipase     Component Value Date/Time   LIPASE 14 09/16/2014 1815       Studies/Results: No results found.  Anti-infectives: Anti-infectives    Start     Dose/Rate Route Frequency Ordered Stop   09/19/14 1200  cefTRIAXone (ROCEPHIN) 2 g in dextrose 5 % 50 mL IVPB     2 g 100 mL/hr over 30 Minutes Intravenous  Once 09/18/14 1807 09/19/14 1316   09/18/14 2000  metroNIDAZOLE (FLAGYL) IVPB 500 mg     500 mg 100 mL/hr over 60 Minutes Intravenous Every 8 hours 09/18/14 1807 09/19/14 0507   09/18/14 1000  [MAR Hold]  cefTRIAXone (ROCEPHIN) 1 g in dextrose 5 % 50 mL IVPB     (On MAR Hold since 09/18/14 1103)   1 g 100 mL/hr over 30 Minutes Intravenous On call to O.R. 09/18/14 0937 09/18/14 1231   09/18/14 1000  [MAR Hold]  metroNIDAZOLE (FLAGYL) IVPB 500 mg     (On MAR Hold since 09/18/14 1103)   500 mg 100 mL/hr over 60 Minutes Intravenous On call to O.R. 09/18/14 7408 09/18/14 1223       Assessment/Plan 1. Intussusception secondary to cecal mass POD # 3 s/p Exp lap with right hemicolectomy and ileocolostomy 2. HTN  3. Hypokalemia - resolved  Plan:  1. Ambulate, IS 2. D/c PCA and start PO and IVP prn   3. Low K and low Na resolved. Supplementing in  IVF.  Repeat K+ of 4.2 this am, continue HCTZ for now 4. On clears, fulls when having good flatus 5. HTN under good control with current treatment (HCTZ 25 mg QD) 6.  SCD's and lovenox 7.  Remove honeycomb dressing and replace with dry dressing 8.  Staples to come out between POD #7-10     LOS: 5 days    DORT, Jinny Blossom 09/21/2014, 7:58 AM Pager: [phone]

## 2014-09-22 MED ORDER — METHOCARBAMOL 500 MG PO TABS: 1000.0000 mg | ORAL_TABLET | Freq: Three times a day (TID) | ORAL | Status: DC | PRN

## 2014-09-22 MED ORDER — FOLIC ACID 1 MG PO TABS: 1.0000 mg | ORAL_TABLET | Freq: Every day | ORAL | Status: DC

## 2014-09-22 MED ORDER — HYDROCODONE-ACETAMINOPHEN 5-325 MG PO TABS: 1.0000 | ORAL_TABLET | Freq: Four times a day (QID) | ORAL | Status: DC | PRN

## 2014-09-22 MED ORDER — DSS 100 MG PO CAPS: 100.0000 mg | ORAL_CAPSULE | Freq: Two times a day (BID) | ORAL | Status: DC | PRN

## 2014-09-22 MED ORDER — THIAMINE HCL 100 MG PO TABS: 100.0000 mg | ORAL_TABLET | Freq: Every day | ORAL | Status: DC

## 2014-09-22 NOTE — Discharge Summary (Signed)
Will se the patient in the near future.  Arrange for Oncology follow up.  Kathryne Eriksson. Dahlia Bailiff, MD, [place] [phone] [facility]

## 2014-09-22 NOTE — Progress Notes (Signed)
Discussed discharge summary with patient. Reviewed all medications with patient.  Patient received Rx. Patient received ring locked up in security. Patient ready for discharge.

## 2014-09-22 NOTE — Discharge Instructions (Signed)
YOU WILL NEED TO GET A COLONOSCOPY IN 6 MONTHS TO EVALUATE THE ENTIRE LENGTH OF YOUR COLON.  PLEASE SCHEDULE WITH DR. Perley Jain OFFICE (GASTROENTEROLOGY)  [facility], [place] [phone]  OPEN ABDOMINAL SURGERY: POST OP INSTRUCTIONS  Always review your discharge instruction sheet given to you by the facility where your surgery was performed.  IF YOU HAVE DISABILITY OR FAMILY LEAVE FORMS, YOU MUST BRING THEM TO THE OFFICE FOR PROCESSING.  PLEASE DO NOT GIVE THEM TO YOUR DOCTOR.  1. A prescription for pain medication may be given to you upon discharge.  Take your pain medication as prescribed, if needed.  If narcotic pain medicine is not needed, then you may take acetaminophen (Tylenol) or ibuprofen (Advil) as needed. 2. Take your usually prescribed medications unless otherwise directed. 3. If you need a refill on your pain medication, please contact your pharmacy. They will contact our office to request authorization.  Prescriptions will not be filled after 5pm or on week-ends. 4. You should follow a light diet the first few days after arrival home, such as soup and crackers, pudding, etc.unless your doctor has advised otherwise. A high-fiber, low fat diet can be resumed as tolerated.   Be sure to include lots of fluids daily. Most patients will experience some swelling and bruising on the chest and neck area.  Ice packs will help.  Swelling and bruising can take several days to resolve 5. Most patients will experience some swelling and bruising in the area of the incision. Ice pack will help. Swelling and bruising can take several days to resolve..  6. It is common to experience some constipation if taking pain medication after surgery.  Increasing fluid intake and taking a stool softener will usually help or prevent this problem from occurring.  A mild laxative (Milk of Magnesia or Miralax) should be taken according to package directions if there are no bowel movements after 48  hours. 7.  You may have steri-strips (small skin tapes) in place directly over the incision.  These strips should be left on the skin for 7-10 days.  If your surgeon used skin glue on the incision, you may shower in 24 hours.  The glue will flake off over the next 2-3 weeks.  Any sutures or staples will be removed at the office during your follow-up visit. You may find that a light gauze bandage over your incision may keep your staples from being rubbed or pulled. You may shower and replace the bandage daily. 8. ACTIVITIES:  You may resume regular (light) daily activities beginning the next day--such as daily self-care, walking, climbing stairs--gradually increasing activities as tolerated.  You may have sexual intercourse when it is comfortable.  Refrain from any heavy lifting or straining until approved by your doctor. a. You may drive when you no longer are taking prescription pain medication, you can comfortably wear a seatbelt, and you can safely maneuver your car and apply brakes b. Return to Work: ___________________________________ 69. You should see your doctor in the office for a follow-up appointment approximately two weeks after your surgery.  Make sure that you call for this appointment within a day or two after you arrive home to insure a convenient appointment time. OTHER INSTRUCTIONS:  _____________________________________________________________ _____________________________________________________________  WHEN TO CALL YOUR DOCTOR: 1. Fever over 101.0 2. Inability to urinate 3. Nausea and/or vomiting 4. Extreme swelling or bruising 5. Continued bleeding from incision. 6. Increased pain, redness, or drainage from the incision. 7.  Difficulty swallowing or breathing 8. Muscle cramping or spasms. 9. Numbness or tingling in hands or feet or around lips.  The clinic staff is available to answer your questions during regular business hours.  Please dont hesitate to call and ask to  speak to one of the nurses if you have concerns.  For further questions, please visit [email or web address]
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
## 2014-10-02 ENCOUNTER — Encounter (HOSPITAL_COMMUNITY): Payer: Self-pay

## 2014-10-06 ENCOUNTER — Other Ambulatory Visit (INDEPENDENT_AMBULATORY_CARE_PROVIDER_SITE_OTHER): Payer: Self-pay

## 2014-10-06 DIAGNOSIS — C189 Malignant neoplasm of colon, unspecified: Secondary | ICD-10-CM

## 2014-10-09 ENCOUNTER — Ambulatory Visit
Admission: RE | Admit: 2014-10-09 | Discharge: 2014-10-09 | Disposition: A | Discharge status: Home / Self Care | Payer: Medicare Other | Source: Ambulatory Visit | Attending: General Surgery | Admitting: General Surgery

## 2014-10-09 DIAGNOSIS — C7989 Secondary malignant neoplasm of other specified sites: Secondary | ICD-10-CM | POA: Diagnosis not present

## 2014-10-09 DIAGNOSIS — C189 Malignant neoplasm of colon, unspecified: Secondary | ICD-10-CM

## 2014-10-09 DIAGNOSIS — R1900 Intra-abdominal and pelvic swelling, mass and lump, unspecified site: Secondary | ICD-10-CM | POA: Diagnosis not present

## 2014-10-09 MED ORDER — IOHEXOL 300 MG/ML  SOLN
100.0000 mL | Freq: Once | INTRAMUSCULAR | Status: AC | PRN
  Administered 2014-10-09: 100 mL via INTRAVENOUS

## 2014-10-10 ENCOUNTER — Telehealth: Payer: Self-pay | Admitting: Hematology

## 2014-10-10 NOTE — Telephone Encounter (Signed)
LEFT MESSAGE FOR PATIENT AND GAVE NP APPT FOR 11/19 @ 2:30 W/DR. FENG. CONTACT INFORMATION LEFT FOR PATIENT TO RETURN CALL TO CONFIRM MESSAGE/NP APPT.

## 2014-10-11 ENCOUNTER — Telehealth: Payer: Self-pay | Admitting: Hematology

## 2014-10-11 DIAGNOSIS — C182 Malignant neoplasm of ascending colon: Secondary | ICD-10-CM | POA: Insufficient documentation

## 2014-10-11 NOTE — Telephone Encounter (Signed)
S/W PATIENT AND GAVE NP APPT FOR 11/19 @ 2:30 W/DR. FENG.  PATIENT CONFIRM APPT/DIRECTION GIVEN.

## 2014-10-12 ENCOUNTER — Ambulatory Visit: Payer: Medicare Other

## 2014-10-12 ENCOUNTER — Ambulatory Visit: Payer: Medicare Other | Admitting: Hematology

## 2014-10-13 ENCOUNTER — Telehealth: Payer: Self-pay | Admitting: Hematology

## 2014-10-13 NOTE — Telephone Encounter (Signed)
left message for patient to return call to r/s np appt.

## 2014-10-18 ENCOUNTER — Telehealth: Payer: Self-pay | Admitting: Hematology

## 2014-10-18 NOTE — Telephone Encounter (Signed)
CALLED PATIENT TO R/S NEW PATIENT APPT, PER PATIENT WILL CALL BACK OUT OF TOWN.

## 2014-10-24 ENCOUNTER — Telehealth: Payer: Self-pay | Admitting: Hematology

## 2014-10-24 NOTE — Telephone Encounter (Signed)
S/W PATIENT AND GAVE NP APPT FOR 12/08 @ 1:30 W/DR. FENG.  REFERRING DR. Ulice Dash WYATT DX- [facility]

## 2014-10-25 ENCOUNTER — Telehealth (INDEPENDENT_AMBULATORY_CARE_PROVIDER_SITE_OTHER): Payer: Self-pay

## 2014-10-25 NOTE — Telephone Encounter (Signed)
Pt s/p right colectomy by Dr Hulen Skains. Pt is calling today to get a refill on her Norco 5/325mg . Pt rates her pain a 8 out of 10 at this time. Pt states that she has been using a heating pad and taking Ibuprofen with minimal extra relief. Informed pt that I would send Dr Hulen Skains a message and we will contact her as soon as we receive a response. Pt verbalized understanding. Will put this in allscripts as well

## 2014-10-27 ENCOUNTER — Ambulatory Visit: Payer: Self-pay | Admitting: General Surgery

## 2014-10-30 ENCOUNTER — Telehealth: Payer: Self-pay | Admitting: Hematology

## 2014-10-30 NOTE — Telephone Encounter (Signed)
Left message for patient and gave new time for appt on 12/08 @ 12:30 w/Dr. Burr Medico. Contact information was left message for to return call to confirm message/appt.

## 2014-10-31 ENCOUNTER — Ambulatory Visit: Payer: Medicare Other

## 2014-10-31 ENCOUNTER — Telehealth: Payer: Self-pay | Admitting: Hematology

## 2014-10-31 ENCOUNTER — Ambulatory Visit (HOSPITAL_BASED_OUTPATIENT_CLINIC_OR_DEPARTMENT_OTHER): Payer: Medicare Other | Admitting: Hematology

## 2014-10-31 ENCOUNTER — Encounter: Payer: Self-pay | Admitting: Hematology

## 2014-10-31 ENCOUNTER — Ambulatory Visit: Payer: Medicare Other | Admitting: Hematology

## 2014-10-31 ENCOUNTER — Encounter (HOSPITAL_COMMUNITY): Payer: Self-pay | Admitting: *Deleted

## 2014-10-31 ENCOUNTER — Telehealth: Payer: Self-pay | Admitting: *Deleted

## 2014-10-31 VITALS — BP 157/96 | HR 79 | Temp 98.3°F | Resp 20 | Ht 67.0 in | Wt 149.5 lb

## 2014-10-31 DIAGNOSIS — C182 Malignant neoplasm of ascending colon: Secondary | ICD-10-CM

## 2014-10-31 MED ORDER — METRONIDAZOLE IN NACL 5-0.79 MG/ML-% IV SOLN
500.0000 mg | INTRAVENOUS | Status: AC
  Administered 2014-11-01: 500 mg via INTRAVENOUS
  Filled 2014-10-31: qty 100
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
## 2014-10-31 NOTE — Telephone Encounter (Signed)
Gave avs & cal for Jan.

## 2014-10-31 NOTE — Telephone Encounter (Signed)
Called pt & unable to reach.  Called pt's friend & she reports pt having surgery tomorrow.  Appt today to be cancelled & moved out 3 wks per Dr Burr Medico.  HIM/Tiffany notified.

## 2014-11-01 ENCOUNTER — Observation Stay (HOSPITAL_COMMUNITY)
Admission: RE | Admit: 2014-11-01 | Discharge: 2014-11-03 | Disposition: A | Discharge status: Home / Self Care | Payer: Medicare Other | Source: Ambulatory Visit | Attending: General Surgery | Admitting: General Surgery

## 2014-11-01 ENCOUNTER — Encounter (HOSPITAL_COMMUNITY): Admission: RE | Disposition: A | Discharge status: Home / Self Care | Payer: Self-pay | Source: Ambulatory Visit

## 2014-11-01 ENCOUNTER — Inpatient Hospital Stay (HOSPITAL_COMMUNITY): Payer: Medicare Other | Admitting: Certified Registered Nurse Anesthetist

## 2014-11-01 ENCOUNTER — Inpatient Hospital Stay (HOSPITAL_COMMUNITY): Payer: Medicare Other

## 2014-11-01 ENCOUNTER — Encounter (HOSPITAL_COMMUNITY): Payer: Self-pay | Admitting: *Deleted

## 2014-11-01 ENCOUNTER — Other Ambulatory Visit: Payer: Self-pay

## 2014-11-01 DIAGNOSIS — C189 Malignant neoplasm of colon, unspecified: Secondary | ICD-10-CM | POA: Insufficient documentation

## 2014-11-01 DIAGNOSIS — M1990 Unspecified osteoarthritis, unspecified site: Secondary | ICD-10-CM | POA: Diagnosis not present

## 2014-11-01 DIAGNOSIS — C562 Malignant neoplasm of left ovary: Secondary | ICD-10-CM | POA: Diagnosis not present

## 2014-11-01 DIAGNOSIS — N838 Other noninflammatory disorders of ovary, fallopian tube and broad ligament: Secondary | ICD-10-CM | POA: Diagnosis not present

## 2014-11-01 DIAGNOSIS — N8320 Unspecified ovarian cysts: Secondary | ICD-10-CM | POA: Diagnosis not present

## 2014-11-01 DIAGNOSIS — I10 Essential (primary) hypertension: Secondary | ICD-10-CM | POA: Insufficient documentation

## 2014-11-01 DIAGNOSIS — Z01818 Encounter for other preprocedural examination: Secondary | ICD-10-CM

## 2014-11-01 DIAGNOSIS — J9811 Atelectasis: Secondary | ICD-10-CM | POA: Diagnosis not present

## 2014-11-01 DIAGNOSIS — D649 Anemia, unspecified: Secondary | ICD-10-CM | POA: Diagnosis not present

## 2014-11-01 DIAGNOSIS — K561 Intussusception: Secondary | ICD-10-CM | POA: Insufficient documentation

## 2014-11-01 DIAGNOSIS — F1290 Cannabis use, unspecified, uncomplicated: Secondary | ICD-10-CM | POA: Insufficient documentation

## 2014-11-01 DIAGNOSIS — K219 Gastro-esophageal reflux disease without esophagitis: Secondary | ICD-10-CM | POA: Diagnosis not present

## 2014-11-01 DIAGNOSIS — J9589 Other postprocedural complications and disorders of respiratory system, not elsewhere classified: Secondary | ICD-10-CM | POA: Diagnosis not present

## 2014-11-01 DIAGNOSIS — Z880 Allergy status to penicillin: Secondary | ICD-10-CM | POA: Diagnosis not present

## 2014-11-01 DIAGNOSIS — R1900 Intra-abdominal and pelvic swelling, mass and lump, unspecified site: Secondary | ICD-10-CM | POA: Diagnosis not present

## 2014-11-01 DIAGNOSIS — F17200 Nicotine dependence, unspecified, uncomplicated: Secondary | ICD-10-CM | POA: Insufficient documentation

## 2014-11-01 HISTORY — DX: Adverse effect of unspecified anesthetic, initial encounter: T41.45XA

## 2014-11-01 HISTORY — DX: Unspecified asthma, uncomplicated: J45.909

## 2014-11-01 HISTORY — DX: Unspecified intracranial injury with loss of consciousness of 30 minutes or less, initial encounter: S06.9X1A

## 2014-11-01 HISTORY — DX: Unspecified intracranial injury with loss of consciousness of unspecified duration, initial encounter: S06.9X9A

## 2014-11-01 HISTORY — PX: LAPAROTOMY: SHX154

## 2014-11-01 HISTORY — DX: Unspecified osteoarthritis, unspecified site: M19.90

## 2014-11-01 HISTORY — DX: Other complications of anesthesia, initial encounter: T88.59XA

## 2014-11-01 HISTORY — DX: Post-traumatic stress disorder, unspecified: F43.10

## 2014-11-01 HISTORY — DX: Anxiety disorder, unspecified: F41.9

## 2014-11-01 HISTORY — DX: Reserved for concepts with insufficient information to code with codable children: IMO0002

## 2014-11-01 HISTORY — DX: Depression, unspecified: F32.A

## 2014-11-01 HISTORY — DX: Major depressive disorder, single episode, unspecified: F32.9

## 2014-11-01 HISTORY — PX: MASS EXCISION: SHX2000

## 2014-11-01 HISTORY — DX: Other specified postprocedural states: R11.2

## 2014-11-01 HISTORY — DX: Other specified postprocedural states: Z98.890

## 2014-11-01 LAB — CBC WITH DIFFERENTIAL/PLATELET
Basophils Absolute: 0 10*3/uL (ref 0.0–0.1)
Basophils Relative: 1 % (ref 0–1)
EOS PCT: 1 % (ref 0–5)
Eosinophils Absolute: 0.1 10*3/uL (ref 0.0–0.7)
HCT: 31.2 % — ABNORMAL LOW (ref 36.0–46.0)
HEMOGLOBIN: 10.5 g/dL — AB (ref 12.0–15.0)
LYMPHS ABS: 2 10*3/uL (ref 0.7–4.0)
Lymphocytes Relative: 42 % (ref 12–46)
MCH: 32.5 pg (ref 26.0–34.0)
MCHC: 33.7 g/dL (ref 30.0–36.0)
MCV: 96.6 fL (ref 78.0–100.0)
MONOS PCT: 6 % (ref 3–12)
Monocytes Absolute: 0.3 10*3/uL (ref 0.1–1.0)
NEUTROS PCT: 50 % (ref 43–77)
Neutro Abs: 2.5 10*3/uL (ref 1.7–7.7)
Platelets: 142 10*3/uL — ABNORMAL LOW (ref 150–400)
RBC: 3.23 MIL/uL — AB (ref 3.87–5.11)
RDW: 12.8 % (ref 11.5–15.5)
WBC: 4.9 10*3/uL (ref 4.0–10.5)

## 2014-11-01 LAB — COMPREHENSIVE METABOLIC PANEL
ALT: 8 U/L (ref 0–35)
AST: 17 U/L (ref 0–37)
Albumin: 3.4 g/dL — ABNORMAL LOW (ref 3.5–5.2)
Alkaline Phosphatase: 66 U/L (ref 39–117)
Anion gap: 14 (ref 5–15)
BILIRUBIN TOTAL: 0.3 mg/dL (ref 0.3–1.2)
BUN: 10 mg/dL (ref 6–23)
CHLORIDE: 107 meq/L (ref 96–112)
CO2: 21 meq/L (ref 19–32)
Calcium: 9 mg/dL (ref 8.4–10.5)
Creatinine, Ser: 0.69 mg/dL (ref 0.50–1.10)
GFR calc Af Amer: >90 mL/min (ref >90)
GFR calc non Af Amer: >90 mL/min (ref >90)
Glucose, Bld: 88 mg/dL (ref 70–99)
Potassium: 3.5 mEq/L — ABNORMAL LOW (ref 3.7–5.3)
SODIUM: 142 meq/L (ref 137–147)
Total Protein: 6.6 g/dL (ref 6.0–8.3)

## 2014-11-01 LAB — TYPE AND SCREEN
ABO/RH(D): B POS
Antibody Screen: NEGATIVE

## 2014-11-01 SURGERY — LAPAROTOMY, EXPLORATORY
Anesthesia: General | Site: Abdomen

## 2014-11-01 MED ORDER — DOCUSATE SODIUM 100 MG PO CAPS
100.0000 mg | ORAL_CAPSULE | Freq: Two times a day (BID) | ORAL | Status: DC | PRN
  Administered 2014-11-02 – 2014-11-03 (×2): 100 mg via ORAL
  Filled 2014-11-01 (×2): qty 1

## 2014-11-01 MED ORDER — ROCURONIUM BROMIDE 50 MG/5ML IV SOLN
INTRAVENOUS | Status: AC
  Filled 2014-11-01: qty 1

## 2014-11-01 MED ORDER — ENOXAPARIN SODIUM 40 MG/0.4ML ~~LOC~~ SOLN
40.0000 mg | SUBCUTANEOUS | Status: DC
  Administered 2014-11-02 – 2014-11-03 (×2): 40 mg via SUBCUTANEOUS
  Filled 2014-11-01 (×2): qty 0.4

## 2014-11-01 MED ORDER — LACTATED RINGERS IV SOLN
INTRAVENOUS | Status: DC
  Administered 2014-11-01: 10:00:00 via INTRAVENOUS

## 2014-11-01 MED ORDER — GLYCOPYRROLATE 0.2 MG/ML IJ SOLN
INTRAMUSCULAR | Status: DC | PRN
  Administered 2014-11-01: 0.6 mg via INTRAVENOUS

## 2014-11-01 MED ORDER — NEOSTIGMINE METHYLSULFATE 10 MG/10ML IV SOLN
INTRAVENOUS | Status: DC | PRN
  Administered 2014-11-01: 5 mg via INTRAVENOUS

## 2014-11-01 MED ORDER — FENTANYL CITRATE 0.05 MG/ML IJ SOLN
INTRAMUSCULAR | Status: AC
  Filled 2014-11-01: qty 5

## 2014-11-01 MED ORDER — ARTIFICIAL TEARS OP OINT
TOPICAL_OINTMENT | OPHTHALMIC | Status: DC | PRN
  Administered 2014-11-01: 1 via OPHTHALMIC

## 2014-11-01 MED ORDER — HYDROMORPHONE HCL 1 MG/ML IJ SOLN
INTRAMUSCULAR | Status: AC
  Filled 2014-11-01: qty 1

## 2014-11-01 MED ORDER — HYDRALAZINE HCL 20 MG/ML IJ SOLN
INTRAMUSCULAR | Status: DC | PRN
  Administered 2014-11-01: 5 mg via INTRAVENOUS
  Administered 2014-11-01: 10 mg via INTRAVENOUS
  Administered 2014-11-01: 5 mg via INTRAVENOUS

## 2014-11-01 MED ORDER — KCL IN DEXTROSE-NACL 20-5-0.45 MEQ/L-%-% IV SOLN
INTRAVENOUS | Status: DC
  Administered 2014-11-01: 100 mL/h via INTRAVENOUS
  Administered 2014-11-02 (×3): via INTRAVENOUS
  Filled 2014-11-01 (×8): qty 1000

## 2014-11-01 MED ORDER — MIDAZOLAM HCL 2 MG/2ML IJ SOLN
INTRAMUSCULAR | Status: AC
  Filled 2014-11-01: qty 2

## 2014-11-01 MED ORDER — HYDROMORPHONE HCL 1 MG/ML IJ SOLN
0.2500 mg | INTRAMUSCULAR | Status: DC | PRN
  Administered 2014-11-01 (×3): 0.5 mg via INTRAVENOUS

## 2014-11-01 MED ORDER — ONDANSETRON HCL 4 MG/2ML IJ SOLN: 4.0000 mg | Freq: Four times a day (QID) | INTRAMUSCULAR | Status: DC | PRN

## 2014-11-01 MED ORDER — LACTATED RINGERS IV SOLN
INTRAVENOUS | Status: DC | PRN
  Administered 2014-11-01: 11:00:00 via INTRAVENOUS

## 2014-11-01 MED ORDER — HYDROCHLOROTHIAZIDE 25 MG PO TABS
25.0000 mg | ORAL_TABLET | Freq: Every day | ORAL | Status: DC
  Administered 2014-11-01 – 2014-11-03 (×3): 25 mg via ORAL
  Filled 2014-11-01 (×3): qty 1

## 2014-11-01 MED ORDER — PROPOFOL 10 MG/ML IV BOLUS
INTRAVENOUS | Status: DC | PRN
  Administered 2014-11-01: 40 mg via INTRAVENOUS
  Administered 2014-11-01: 160 mg via INTRAVENOUS

## 2014-11-01 MED ORDER — MIDAZOLAM HCL 5 MG/5ML IJ SOLN
INTRAMUSCULAR | Status: DC | PRN
  Administered 2014-11-01: 2 mg via INTRAVENOUS

## 2014-11-01 MED ORDER — HYDROMORPHONE HCL 1 MG/ML IJ SOLN
0.5000 mg | INTRAMUSCULAR | Status: DC | PRN
  Administered 2014-11-01 (×2): 1 mg via INTRAVENOUS
  Administered 2014-11-02: 0.5 mg via INTRAVENOUS
  Administered 2014-11-02 (×2): 1 mg via INTRAVENOUS
  Filled 2014-11-01 (×5): qty 1

## 2014-11-01 MED ORDER — ONDANSETRON HCL 4 MG/2ML IJ SOLN
INTRAMUSCULAR | Status: AC
  Filled 2014-11-01: qty 2

## 2014-11-01 MED ORDER — LIDOCAINE HCL (CARDIAC) 20 MG/ML IV SOLN
INTRAVENOUS | Status: AC
  Filled 2014-11-01: qty 5

## 2014-11-01 MED ORDER — PROPOFOL 10 MG/ML IV BOLUS
INTRAVENOUS | Status: AC
  Filled 2014-11-01: qty 20

## 2014-11-01 MED ORDER — LACTATED RINGERS IV SOLN
INTRAVENOUS | Status: DC | PRN
  Administered 2014-11-01: 10:00:00 via INTRAVENOUS

## 2014-11-01 MED ORDER — KCL IN DEXTROSE-NACL 20-5-0.45 MEQ/L-%-% IV SOLN
INTRAVENOUS | Status: AC
  Filled 2014-11-01: qty 1000

## 2014-11-01 MED ORDER — FAMOTIDINE 20 MG PO TABS
20.0000 mg | ORAL_TABLET | Freq: Two times a day (BID) | ORAL | Status: DC
  Administered 2014-11-01 – 2014-11-03 (×4): 20 mg via ORAL
  Filled 2014-11-01 (×6): qty 1

## 2014-11-01 MED ORDER — ROCURONIUM BROMIDE 100 MG/10ML IV SOLN
INTRAVENOUS | Status: DC | PRN
  Administered 2014-11-01: 50 mg via INTRAVENOUS

## 2014-11-01 MED ORDER — SODIUM CHLORIDE 0.9 % IV SOLN
10.0000 mg | INTRAVENOUS | Status: DC | PRN
  Administered 2014-11-01: 10 ug/min via INTRAVENOUS

## 2014-11-01 MED ORDER — ONDANSETRON HCL 4 MG PO TABS
4.0000 mg | ORAL_TABLET | Freq: Four times a day (QID) | ORAL | Status: DC | PRN
  Administered 2014-11-02: 4 mg via ORAL
  Filled 2014-11-01: qty 1

## 2014-11-01 MED ORDER — HYDROCODONE-ACETAMINOPHEN 5-325 MG PO TABS
1.0000 | ORAL_TABLET | Freq: Four times a day (QID) | ORAL | Status: DC | PRN
Start: 2014-11-01 — End: 2014-11-03
  Administered 2014-11-01 – 2014-11-03 (×6): 2 via ORAL
  Filled 2014-11-01 (×6): qty 2

## 2014-11-01 MED ORDER — GLYCOPYRROLATE 0.2 MG/ML IJ SOLN
INTRAMUSCULAR | Status: AC
  Filled 2014-11-01: qty 1

## 2014-11-01 MED ORDER — POVIDONE-IODINE 10 % EX OINT
TOPICAL_OINTMENT | CUTANEOUS | Status: AC
  Filled 2014-11-01: qty 28.35

## 2014-11-01 MED ORDER — LIDOCAINE HCL (CARDIAC) 20 MG/ML IV SOLN
INTRAVENOUS | Status: DC | PRN
  Administered 2014-11-01: 70 mg via INTRAVENOUS

## 2014-11-01 MED ORDER — FENTANYL CITRATE 0.05 MG/ML IJ SOLN
INTRAMUSCULAR | Status: DC | PRN
  Administered 2014-11-01: 150 ug via INTRAVENOUS
  Administered 2014-11-01: 100 ug via INTRAVENOUS

## 2014-11-01 MED ORDER — POVIDONE-IODINE 10 % OINT PACKET
TOPICAL_OINTMENT | CUTANEOUS | Status: DC | PRN
  Administered 2014-11-01: 1 via TOPICAL

## 2014-11-01 MED ORDER — CIPROFLOXACIN IN D5W 400 MG/200ML IV SOLN
400.0000 mg | Freq: Once | INTRAVENOUS | Status: AC
  Administered 2014-11-01: 400 mg via INTRAVENOUS
  Filled 2014-11-01: qty 200

## 2014-11-01 MED ORDER — NEOSTIGMINE METHYLSULFATE 10 MG/10ML IV SOLN
INTRAVENOUS | Status: AC
  Filled 2014-11-01: qty 1

## 2014-11-01 MED ORDER — GLYCOPYRROLATE 0.2 MG/ML IJ SOLN
INTRAMUSCULAR | Status: AC
  Filled 2014-11-01: qty 2

## 2014-11-01 MED ORDER — STERILE WATER FOR IRRIGATION IR SOLN
Status: DC | PRN
  Administered 2014-11-01: 1000 mL

## 2014-11-01 MED ORDER — METHOCARBAMOL 500 MG PO TABS
1000.0000 mg | ORAL_TABLET | Freq: Three times a day (TID) | ORAL | Status: DC | PRN
  Administered 2014-11-01 – 2014-11-03 (×2): 1000 mg via ORAL
  Filled 2014-11-01 (×2): qty 2

## 2014-11-01 MED ORDER — 0.9 % SODIUM CHLORIDE (POUR BTL) OPTIME
TOPICAL | Status: DC | PRN
  Administered 2014-11-01: 2000 mL

## 2014-11-01 SURGICAL SUPPLY — 49 items
BLADE SURG ROTATE 9660 (MISCELLANEOUS) IMPLANT
BRR ADH 5X3 SEPRAFILM 6 SHT (MISCELLANEOUS)
CANISTER SUCTION 2500CC (MISCELLANEOUS) ×3 IMPLANT
CHLORAPREP W/TINT 26ML (MISCELLANEOUS) ×3 IMPLANT
COVER MAYO STAND STRL (DRAPES) IMPLANT
COVER SURGICAL LIGHT HANDLE (MISCELLANEOUS) ×3 IMPLANT
DRAPE LAPAROSCOPIC ABDOMINAL (DRAPES) ×3 IMPLANT
DRAPE PROXIMA HALF (DRAPES) IMPLANT
DRAPE UTILITY XL STRL (DRAPES) ×6 IMPLANT
DRAPE WARM FLUID 44X44 (DRAPE) ×3 IMPLANT
DRSG OPSITE POSTOP 4X10 (GAUZE/BANDAGES/DRESSINGS) IMPLANT
DRSG OPSITE POSTOP 4X8 (GAUZE/BANDAGES/DRESSINGS) IMPLANT
ELECT BLADE 6.5 EXT (BLADE) IMPLANT
ELECT CAUTERY BLADE 6.4 (BLADE) ×6 IMPLANT
ELECT REM PT RETURN 9FT ADLT (ELECTROSURGICAL) ×3
ELECTRODE REM PT RTRN 9FT ADLT (ELECTROSURGICAL) ×2 IMPLANT
GLOVE BIO SURGEON STRL SZ 6.5 (GLOVE) ×1 IMPLANT
GLOVE BIOGEL PI IND STRL 6.5 (GLOVE) IMPLANT
GLOVE BIOGEL PI IND STRL 8 (GLOVE) ×2 IMPLANT
GLOVE BIOGEL PI INDICATOR 6.5 (GLOVE) ×1
GLOVE BIOGEL PI INDICATOR 8 (GLOVE) ×1
GLOVE ECLIPSE 7.5 STRL STRAW (GLOVE) ×3 IMPLANT
GOWN STRL REUS W/ TWL LRG LVL3 (GOWN DISPOSABLE) ×6 IMPLANT
GOWN STRL REUS W/TWL LRG LVL3 (GOWN DISPOSABLE) ×9
KIT BASIN OR (CUSTOM PROCEDURE TRAY) ×3 IMPLANT
KIT ROOM TURNOVER OR (KITS) ×3 IMPLANT
LIGASURE IMPACT 36 18CM CVD LR (INSTRUMENTS) IMPLANT
NS IRRIG 1000ML POUR BTL (IV SOLUTION) ×6 IMPLANT
PACK GENERAL/GYN (CUSTOM PROCEDURE TRAY) ×3 IMPLANT
PAD ARMBOARD 7.5X6 YLW CONV (MISCELLANEOUS) ×3 IMPLANT
PENCIL BUTTON HOLSTER BLD 10FT (ELECTRODE) IMPLANT
SEPRAFILM PROCEDURAL PACK 3X5 (MISCELLANEOUS) IMPLANT
SPECIMEN JAR LARGE (MISCELLANEOUS) IMPLANT
SPONGE GAUZE 4X4 12PLY STER LF (GAUZE/BANDAGES/DRESSINGS) ×1 IMPLANT
SPONGE LAP 18X18 X RAY DECT (DISPOSABLE) IMPLANT
STAPLER VISISTAT 35W (STAPLE) ×3 IMPLANT
SUCTION POOLE TIP (SUCTIONS) ×3 IMPLANT
SUT NOVA 1 T20/GS 25DT (SUTURE) IMPLANT
SUT PDS AB 1 TP1 96 (SUTURE) ×6 IMPLANT
SUT SILK 2 0 SH CR/8 (SUTURE) ×3 IMPLANT
SUT SILK 2 0 TIES 10X30 (SUTURE) ×3 IMPLANT
SUT SILK 3 0 SH CR/8 (SUTURE) ×3 IMPLANT
SUT SILK 3 0 TIES 10X30 (SUTURE) ×3 IMPLANT
SYR CONTROL 10ML LL (SYRINGE) ×1 IMPLANT
TAPE CLOTH SURG 6X10 WHT LF (GAUZE/BANDAGES/DRESSINGS) ×1 IMPLANT
TOWEL OR 17X26 10 PK STRL BLUE (TOWEL DISPOSABLE) ×3 IMPLANT
TRAY FOLEY CATH 16FRSI W/METER (SET/KITS/TRAYS/PACK) IMPLANT
TUBE CONNECTING 12X1/4 (SUCTIONS) IMPLANT
YANKAUER SUCT BULB TIP NO VENT (SUCTIONS) IMPLANT

## 2014-11-01 NOTE — Transfer of Care (Signed)
Immediate Anesthesia Transfer of Care Note  Patient: Catherine Rogers  Procedure(s) Performed: Procedure(s): EXPLORATORY LAPAROTOMY (N/A) EXCISION PELVIC MASS  Patient Location: PACU  Anesthesia Type:General  Level of Consciousness: awake, alert , oriented and patient cooperative  Airway & Oxygen Therapy: Patient Spontanous Breathing and Patient connected to face mask oxygen  Post-op Assessment: Report given to PACU RN, Post -op Vital signs reviewed and stable and Patient moving all extremities X 4  Post vital signs: Reviewed and stable  Complications: No apparent anesthesia complications
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
## 2014-11-01 NOTE — Interval H&P Note (Signed)
History and Physical Interval Note: Patient had no new questions.  Labs being drawn.  Will call son after surgery. 11/01/2014 9:32 AM  [name]  has presented today for surgery, with the diagnosis of postier pelvic mass, possible tumor 10 cm  The various methods of treatment have been discussed with the patient and family. After consideration of risks, benefits and other options for treatment, the patient has consented to  Procedure(s): EXPLORATORY LAPAROTOMY, EXCISION PELVIC MASS (N/A) as a surgical intervention .  The patient's history has been reviewed, patient examined, no change in status, stable for surgery.  I have reviewed the patient's chart and labs.  Questions were answered to the patient's satisfaction.     Adel Neyer, JAY

## 2014-11-01 NOTE — Op Note (Signed)
OPERATIVE REPORT  DATE OF OPERATION: 11/01/2014  PATIENT:  Catherine Rogers  53 y.o. female  PRE-OPERATIVE DIAGNOSIS:  Cystic pelvic mass  POST-OPERATIVE DIAGNOSIS:  Large cystic mass of the left ovary  PROCEDURE:  Procedure(s): EXPLORATORY LAPAROTOMY EXCISION PELVIC MASS/LEFT OVARY WITH CYSTIC MASS  SURGEON:  Surgeon(s): Doreen Salvage, MD  ASSISTANT: nONE  ANESTHESIA:   general  EBL: <50 ml  BLOOD ADMINISTERED: none  DRAINS: none   SPECIMEN:  Source of Specimen:  Left ovary with mass.  Ovaric cystic fluid for cytology  COUNTS CORRECT:  YES  PROCEDURE DETAILS: The patient was taken to the operating room and placed on the table in supine position. After an adequate general endotracheal anesthetic was administered she was prepped and draped in the usual sterile manner exposing her abdomen.  A proper timeout was performed identifying the patient and the procedure to be performed. A lower midline incision was made just below the umbilicus down to the pubic crest. Particular down to and through the midline fascia carefully with electrocautery. There was one area in the upper portion of the incision where there is an adhesion of small bowel to the anterior abdominal wall which was taken down safely without injury to the bowel.  The patient was placed in Trendelenburg. She had a large cystic mass in her left lower quadrant pelvic area. This was mobilized into the wound. In doing so the cystic portion rupture without any evidence of mucoid changes. We aspirated some the fluid for cytology. The mass was intimately associated with the left ovary. We took down the gonadal vessels and the ovarian ligament with Kelly clamps and 2-0 silk ties. Once the mass was completely detached we handed off as specimen.  We irrigated with a liter of sterile water. Hemostasis was obtained easily. The abdomen was then closed using running looped #1 PDS suture. Stainless steel staples were used to complete the  skin closure. Betadine ointment and sterile 4 x 4 gauze were used to complete the dressings. All needle counts, sponge counts, and instrument counts were correct.  PATIENT DISPOSITION:  PACU - hemodynamically stable.   Domique Clapper, JAY 12/9/201511:40 AM

## 2014-11-01 NOTE — Anesthesia Preprocedure Evaluation (Signed)
Anesthesia Evaluation  Patient identified by MRN, date of birth, ID band Patient awake    Reviewed: Allergy & Precautions, H&P , NPO status , Patient's Chart, lab work & pertinent test results  History of Anesthesia Complications (+) PONV and history of anesthetic complications  Airway Mallampati: I  TM Distance: >3 FB Neck ROM: Full    Dental  (+) Chipped,    Pulmonary neg shortness of breath, neg sleep apnea, neg COPDneg recent URI, Current Smoker,  breath sounds clear to auscultation        Cardiovascular hypertension, Pt. on medications Rhythm:Regular     Neuro/Psych PSYCHIATRIC DISORDERS Anxiety Depression negative neurological ROS     GI/Hepatic Neg liver ROS, GERD-  Medicated and Controlled,Colon cancer s/p excision with pelvic mass   Endo/Other  negative endocrine ROS  Renal/GU negative Renal ROS     Musculoskeletal   Abdominal   Peds  Hematology negative hematology ROS (+)   Anesthesia Other Findings   Reproductive/Obstetrics                             Anesthesia Physical Anesthesia Plan  ASA: III  Anesthesia Plan: General   Post-op Pain Management:    Induction: Intravenous  Airway Management Planned: Oral ETT  Additional Equipment: None  Intra-op Plan:   Post-operative Plan: Extubation in OR  Informed Consent: I have reviewed the patients History and Physical, chart, labs and discussed the procedure including the risks, benefits and alternatives for the proposed anesthesia with the patient or authorized representative who has indicated his/her understanding and acceptance.   Dental advisory given  Plan Discussed with: CRNA and Surgeon  Anesthesia Plan Comments:         Anesthesia Quick Evaluation

## 2014-11-01 NOTE — Anesthesia Postprocedure Evaluation (Signed)
  Anesthesia Post-op Note  Patient: Catherine Rogers  Procedure(s) Performed: Procedure(s): EXPLORATORY LAPAROTOMY (N/A) EXCISION PELVIC MASS  Patient Location: PACU  Anesthesia Type: General   Level of Consciousness: awake, alert  and oriented  Airway and Oxygen Therapy: Patient Spontanous Breathing  Post-op Pain: moderate  Post-op Assessment: Post-op Vital signs reviewed  Post-op Vital Signs: Reviewed  Last Vitals:  Filed Vitals:   11/01/14 1837  BP: 144/77  Pulse: 72  Temp: 37 C  Resp: 18    Complications: No apparent anesthesia complications

## 2014-11-02 DIAGNOSIS — N8320 Unspecified ovarian cysts: Secondary | ICD-10-CM | POA: Diagnosis not present

## 2014-11-02 LAB — BASIC METABOLIC PANEL
ANION GAP: 13 (ref 5–15)
BUN: 6 mg/dL (ref 6–23)
CHLORIDE: 103 meq/L (ref 96–112)
CO2: 20 mEq/L (ref 19–32)
Calcium: 8.7 mg/dL (ref 8.4–10.5)
Creatinine, Ser: 0.66 mg/dL (ref 0.50–1.10)
Glucose, Bld: 109 mg/dL — ABNORMAL HIGH (ref 70–99)
POTASSIUM: 3.6 meq/L — AB (ref 3.7–5.3)
SODIUM: 136 meq/L — AB (ref 137–147)

## 2014-11-02 LAB — CBC
HCT: 27.4 % — ABNORMAL LOW (ref 36.0–46.0)
HEMOGLOBIN: 9.5 g/dL — AB (ref 12.0–15.0)
MCH: 33.3 pg (ref 26.0–34.0)
MCHC: 34.7 g/dL (ref 30.0–36.0)
MCV: 96.1 fL (ref 78.0–100.0)
Platelets: 151 10*3/uL (ref 150–400)
RBC: 2.85 MIL/uL — ABNORMAL LOW (ref 3.87–5.11)
RDW: 12.8 % (ref 11.5–15.5)
WBC: 6.2 10*3/uL (ref 4.0–10.5)

## 2014-11-02 LAB — CEA: CEA: 0.8 ng/mL (ref 0.0–5.0)

## 2014-11-02 NOTE — Progress Notes (Signed)
Pt was given PRN pain medication of Vicodin 2 tabs at 1359. The order is written PRN every 6 hours. Pt called and wanted PO pain medication at 1800. Pt was told when she could have the PO pain medication again around 2000. I went to the pt's room to discuss pain medication options with pt. Explained to pt that she can have IV pain medication if she needs it but reminded that when she is home she won't be able to get IV pain medication. Pt was angry that she couldn't have PO pain medication and stated, "At home I don't take medication by a time. I take it when I need it." Pt then stated she wanted me to leave her alone and didn't want to hear about how the medication orders are written. Pt was again offered the IV pain medication but patient declined.

## 2014-11-02 NOTE — Discharge Instructions (Signed)
[facility]      [facility] [phone]  OPEN ABDOMINAL SURGERY: POST OP INSTRUCTIONS  Always review your discharge instruction sheet given to you by the facility where your surgery was performed.  IF YOU HAVE DISABILITY OR FAMILY LEAVE FORMS, YOU MUST BRING THEM TO THE OFFICE FOR PROCESSING.  PLEASE DO NOT GIVE THEM TO YOUR DOCTOR.  1. A prescription for pain medication may be given to you upon discharge.  Take your pain medication as prescribed, if needed.  If narcotic pain medicine is not needed, then you may take acetaminophen (Tylenol) or ibuprofen (Advil) as needed. 2. Take your usually prescribed medications unless otherwise directed. 3. If you need a refill on your pain medication, please contact your pharmacy. They will contact our office to request authorization.  Prescriptions will not be filled after 5pm or on week-ends. 4. You should follow a light diet the first few days after arrival home, such as soup and crackers, pudding, etc.unless your doctor has advised otherwise. A high-fiber, low fat diet can be resumed as tolerated.   Be sure to include lots of fluids daily. Most patients will experience some swelling and bruising on the chest and neck area.  Ice packs will help.  Swelling and bruising can take several days to resolve 5. Most patients will experience some swelling and bruising in the area of the incision. Ice pack will help. Swelling and bruising can take several days to resolve..  6. It is common to experience some constipation if taking pain medication after surgery.  Increasing fluid intake and taking a stool softener will usually help or prevent this problem from occurring.  A mild laxative (Milk of Magnesia or Miralax) should be taken according to package directions if there are no bowel movements after 48 hours. 7.  You may have steri-strips (small skin tapes) in place directly over the incision.  These strips should be left on the skin for 7-10 days.  If your  surgeon used skin glue on the incision, you may shower in 24 hours.  The glue will flake off over the next 2-3 weeks.  Any sutures or staples will be removed at the office during your follow-up visit. You may find that a light gauze bandage over your incision may keep your staples from being rubbed or pulled. You may shower and replace the bandage daily. 8. ACTIVITIES:  You may resume regular (light) daily activities beginning the next day--such as daily self-care, walking, climbing stairs--gradually increasing activities as tolerated.  You may have sexual intercourse when it is comfortable.  Refrain from any heavy lifting or straining until approved by your doctor. a. You may drive when you no longer are taking prescription pain medication, you can comfortably wear a seatbelt, and you can safely maneuver your car and apply brakes b. Return to Work: ___________________________________ 9. You should see your doctor in the office for a follow-up appointment approximately two weeks after your surgery.  Make sure that you call for this appointment within a day or two after you arrive home to insure a convenient appointment time. OTHER INSTRUCTIONS:  _____________________________________________________________ _____________________________________________________________  WHEN TO CALL YOUR DOCTOR: 1. Fever over 101.0 2. Inability to urinate 3. Nausea and/or vomiting 4. Extreme swelling or bruising 5. Continued bleeding from incision. 6. Increased pain, redness, or drainage from the incision. 7. Difficulty swallowing or breathing 8. Muscle cramping or spasms. 9. Numbness or tingling in hands or feet or around lips.  The clinic staff is available to   answer your questions during regular business hours.  Please don't hesitate to call and ask to speak to one of the nurses if you have concerns.  For further questions, please visit [email or web address]   
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
## 2014-11-02 NOTE — Progress Notes (Signed)
LOS: 1 day   Subjective: POD #1 s/p exp lap with excision of pelvic mass. Reports herself to be doing well this morning. Tolerated small amount of regular diet/meal this morning. Had slight nausea that resolved with zofran. No BM yet. Has been OOB and ambulated short distances. Pain under good control. No issues with UO.    Objective: Vital signs in last 24 hours: Temp:  [97.6 F (36.4 C)-99.3 F (37.4 C)] 99.3 F (37.4 C) (12/10 0956) Pulse Rate:  [64-111] 100 (12/10 0956) Resp:  [11-23] 18 (12/10 0956) BP: (121-164)/(64-98) 164/85 mmHg (12/10 0956) SpO2:  [95 %-100 %] 99 % (12/10 0956) Last BM Date: 10/31/14   Laboratory  CBC  Recent Labs  11/01/14 0939 11/02/14 0410  WBC 4.9 6.2  HGB 10.5* 9.5*  HCT 31.2* 27.4*  PLT 142* 151   BMET  Recent Labs  11/01/14 0939 11/02/14 0410  NA 142 136*  K 3.5* 3.6*  CL 107 103  CO2 21 20  GLUCOSE 88 109*  BUN 10 6  CREATININE 0.69 0.66  CALCIUM 9.0 8.7     Physical Exam General appearance: alert, cooperative, appears stated age and no distress Resp: clear to auscultation bilaterally Cardio: regular rate and rhythm GI: soft, expected incisional tenderness, no distention, hypoactive BS Incision/Wound: lower midline, clean, dry and closed with staples   Assessment/Plan: POD # 1 s/p exploratory laparotomy with excision of large cystic mass of left ovary. Making good progress postoperatively. Continue ambulation and regular diet with transition to oral pain meds.  Surg path pending.  HTN: resume oral preoperative meds (HCTZ) Anemia: H/H stable at 9.5/27.5 Will discuss discharge planning with Dr. Hulen Skains today.  Lahoma Rocker, [facility] Pager [phone] 11/02/2014

## 2014-11-02 NOTE — Progress Notes (Signed)
Pt c/o pain. Pt offered pain med but refuses to take because she "doesn't want anything" and starts throwin g her belongings around saying that she is going home. Pt offered pain med again and becomes irate. After talking with pt she finally takes pain med and settles down. Will continue to monitor pt.

## 2014-11-03 ENCOUNTER — Encounter (HOSPITAL_COMMUNITY): Payer: Self-pay | Admitting: General Surgery

## 2014-11-03 DIAGNOSIS — N8320 Unspecified ovarian cysts: Secondary | ICD-10-CM | POA: Diagnosis not present

## 2014-11-03 MED ORDER — HYDROCODONE-ACETAMINOPHEN 5-325 MG PO TABS: 1.0000 | ORAL_TABLET | Freq: Four times a day (QID) | ORAL | Status: DC | PRN

## 2014-11-03 NOTE — Progress Notes (Signed)
Discharge instructions reviewed with patient. Rx reviewed. Pt verbalized understanding and reported that she will get a PCP provider. Pt will be discharging when her son arrives to drive her home. Pt reports feeling better today.

## 2014-11-04 NOTE — Anesthesia Postprocedure Evaluation (Deleted)
  Anesthesia Post-op Note  Patient: Catherine Rogers  Procedure(s) Performed: Procedure(s): EXPLORATORY LAPAROTOMY (N/A) EXCISION PELVIC MASS  Patient Location: PACU  Anesthesia Type:General  Level of Consciousness: awake  Airway and Oxygen Therapy: Patient Spontanous Breathing  Post-op Pain: mild  Post-op Assessment: Post-op Vital signs reviewed, Patient's Cardiovascular Status Stable, Respiratory Function Stable, Patent Airway, No signs of Nausea or vomiting and Pain level controlled  Post-op Vital Signs: Reviewed and stable  Last Vitals:  Filed Vitals:   11/03/14 0610  BP: 171/87  Pulse: 88  Temp: 37.2 C  Resp:     Complications: No apparent anesthesia complications

## 2014-11-05 ENCOUNTER — Encounter: Payer: Self-pay | Admitting: Hematology

## 2014-11-14 ENCOUNTER — Telehealth: Payer: Self-pay | Admitting: Hematology

## 2014-11-14 NOTE — Telephone Encounter (Signed)
MOVED 11/28/14 APPT TO AM DUE TO CALL DAY. LMONVM FOR PT AND MAILED SCHEDULE.

## 2014-11-24 HISTORY — PX: OOPHORECTOMY: SHX86

## 2014-11-28 ENCOUNTER — Other Ambulatory Visit: Payer: Medicare Other

## 2014-11-28 ENCOUNTER — Encounter: Payer: Self-pay | Admitting: Hematology

## 2014-11-28 ENCOUNTER — Ambulatory Visit (HOSPITAL_BASED_OUTPATIENT_CLINIC_OR_DEPARTMENT_OTHER): Payer: Medicare Other | Admitting: Hematology

## 2014-11-28 ENCOUNTER — Telehealth: Payer: Self-pay | Admitting: Hematology

## 2014-11-28 ENCOUNTER — Other Ambulatory Visit: Payer: Self-pay | Admitting: Hematology and Oncology

## 2014-11-28 VITALS — BP 181/91 | HR 60 | Temp 98.2°F | Resp 20 | Ht 67.0 in | Wt 141.2 lb

## 2014-11-28 DIAGNOSIS — C562 Malignant neoplasm of left ovary: Secondary | ICD-10-CM

## 2014-11-28 DIAGNOSIS — C182 Malignant neoplasm of ascending colon: Secondary | ICD-10-CM

## 2014-11-28 DIAGNOSIS — I10 Essential (primary) hypertension: Secondary | ICD-10-CM | POA: Diagnosis not present

## 2014-11-28 DIAGNOSIS — C189 Malignant neoplasm of colon, unspecified: Secondary | ICD-10-CM

## 2014-11-28 MED ORDER — MIRTAZAPINE 15 MG PO TABS: 15.0000 mg | ORAL_TABLET | Freq: Every day | ORAL | Status: DC

## 2014-11-28 MED ORDER — OXYCODONE HCL 5 MG PO TABS: 5.0000 mg | ORAL_TABLET | ORAL | Status: DC | PRN

## 2014-11-28 MED ORDER — ONDANSETRON HCL 8 MG PO TABS: 8.0000 mg | ORAL_TABLET | Freq: Three times a day (TID) | ORAL | Status: DC | PRN

## 2014-11-28 NOTE — Telephone Encounter (Signed)
Pt confirmed labs/ov per 01/05 POF, gave pt AVS.... KJ

## 2014-11-28 NOTE — Progress Notes (Addendum)
[place]  [phone]) [phone] [phone]  Clinic New Consult Note   Patient Care Team: No Pcp Per Patient as PCP - General (General Practice) Truitt Merle, MD as Consulting Physician (Hematology) Doreen Salvage, MD as Consulting Physician (General Surgery) Nat Math, MD as Referring Physician (Obstetrics and Gynecology) 11/28/2014  CHIEF COMPLAINTS/PURPOSE OF CONSULTATION:  1. Colon cancer, pT3N1M0, stage III 2. Ovarian cancer, pT1cNxM0, stage I      Adenocarcinoma of colon   09/16/2014 Imaging CT abd/pel:  a 3.8cm cecal mass with associated ileocecal intussusception, and a 9.1cm solid and cystic mass in the left pelvis,   09/18/2014 Initial Diagnosis Adenocarcinoma of right colon   09/18/2014 Pathologic Stage pT3pN1Mx, tumor extends into pericolonic soft tissue and is less than 62m from the serosal surface, LVI 8-), PNI (-), all 23 node negative, one soft tissue tumor deposit, surgical margins negative.    09/18/2014 Surgery right hemicolectomy and terminal ileoectomy    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   10/09/2014 Imaging CT of the abdomen and pelvis showed a 9.1 cm solid and cystic mass in the left pelvis concerning for malignancy. This was discovered during her workup for colon cancer.   11/01/2014 Initial Diagnosis 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   11/01/2014 Surgery Salpingo-oophorectomy.   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 Tumor size 3.7 cm, no lymph nodes removed. T1cNx. Tumor cells are positive for cytokeratin 7 and estrogen receptor.     HISTORY OF PRESENTING ILLNESS:  [name]. female is here because of recently diagnosed colon cancer.   She presented with abdominal pain, nausea and vomiting, hemoptoses in Oct 2015.  [facility] She had a CT scan that revealed a 3.8cm cecal mass with associated ileocecal intussusception, and a 9.1cm solid and cystic mass in the left pelvis, likely a peritoneal met with ascites.  EGD was negative. She had exploratory laparoscopy on 09/18/2014 by Dr. WHulen Skains and underwent right hemicolectomy and ileocecectomy. The pathology reviewed also related an invasive adenocarcinoma at the terminal ileum and cecum. She was discharged home on 09/22/2014.  She underwent a second surgery for left pelvis cystic mass resection on 11/01/2014, surgical path showed ovarian cancer.  INTERIM HISTORY: She had second aurgery on 11/01/2014. She has slow recovery. She still has quite severe abdominal pain, on both left and right low abdomen, 7-10/10, she takes hydrocodone and motrin for pain, which helps only some. She run out of Vicodin also one week ago. She has intermittent nausea and vomiting. Her appetite was really poor after surgery, got better since last week. She drinks fluids adequately. She can not sleep well at night, she lost additional 5 pounds since the second surgery.    MEDICAL HISTORY:  Past Medical History  Diagnosis Date  . Hypertension   . Ectopic pregnancy   . Complication of anesthesia     woke and saw long tube coming out of mouth . sat up then went back to sleep  . PONV (postoperative nausea and vomiting)   . Head injury, closed, with brief LOC     had a cut- as a child  . Asthma   . Anxiety   . Depression   . PTSD (post-traumatic stress disorder)   . GERD (gastroesophageal reflux disease)   . Arthritis   . Pelvic cyst  PTSD   SURGICAL HISTORY: Past Surgical History  Procedure Laterality Date  . Esophagogastroduodenoscopy N/A 09/17/2014    Procedure: ESOPHAGOGASTRODUODENOSCOPY (EGD);  Surgeon: Jeryl Columbia, MD;  [facility] END[facility]COPY;  Service: Endoscopy;  Laterality: N/A;  . Abdominal hysterectomy    . Tubal ligation    . Partial colectomy Right 09/18/2014     Procedure: RIGHT HEMI COLECTOMY;  Surgeon: Doreen Salvage, MD;  Location: [place];  Service: General;  Laterality: Right;  . Ileostomy Right 09/18/2014    Procedure: ILEOCOLOSTOMY;  Surgeon: Doreen Salvage, MD;  Location: [place];  Service: General;  Laterality: Right;  . Laparotomy N/A 11/01/2014    Procedure: EXPLORATORY LAPAROTOMY;  Surgeon: Doreen Salvage, MD;  Location: [place];  Service: General;  Laterality: N/A;  . Mass excision  11/01/2014    Procedure: EXCISION PELVIC MASS;  Surgeon: Doreen Salvage, MD;  Location: [place];  Service: General;;    SOCIAL HISTORY: History   Social History  . Marital Status: Single    Spouse Name: N/A    Number of Children: N/A  . Years of Education: N/A   Occupational History  . Not on file.   Social History Main Topics  . Smoking status: Current Some Day Smoker -- 0.25 packs/day for 5 years    Types: Cigars  . Smokeless tobacco: Never Used     Comment: every blue moon  . Alcohol Use: No     Comment: wine cooler special occasion  . Drug Use: Yes    Special: Marijuana     Comment: smokes marijuana twice a day for arthritis  . Sexual Activity: Not on file   Other Topics Concern  . Not on file   Social History Narrative    FAMILY HISTORY: Family History  Problem Relation Age of Onset  . Cancer Sister 59    brain cancer   . Cancer Brother 8    lung cancer     ALLERGIES:  is allergic to other; demerol; morphine and related; and penicillins.  MEDICATIONS:  Current Outpatient Prescriptions  Medication Sig Dispense Refill  . docusate sodium 100 MG CAPS Take 100 mg by mouth 2 (two) times daily as needed for mild constipation. 10 capsule 0  . hydrochlorothiazide (HYDRODIURIL) 25 MG tablet Take 25 mg by mouth daily.    [name] HYDROcodone-acetaminophen (NORCO/VICODIN) 5-325 MG per tablet Take 1-2 tablets by mouth every 6 (six) hours as needed for moderate pain or severe pain. 40 tablet 0  . ibuprofen (ADVIL,MOTRIN) 800 MG tablet Take 800 mg by mouth every 8 (eight)  hours as needed for mild pain.     . methocarbamol (ROBAXIN) 500 MG tablet Take 2 tablets (1,000 mg total) by mouth every 8 (eight) hours as needed for muscle spasms. 30 tablet 0  . ranitidine (ZANTAC) 150 MG tablet Take 150 mg by mouth 2 (two) times daily as needed for heartburn.     . mirtazapine (REMERON) 15 MG tablet Take 1 tablet (15 mg total) by mouth at bedtime. 30 tablet 2  . ondansetron (ZOFRAN) 8 MG tablet Take 1 tablet (8 mg total) by mouth every 8 (eight) hours as needed for nausea or vomiting. 20 tablet 3  . oxyCODONE (OXY IR/ROXICODONE) 5 MG immediate release tablet Take 1 tablet (5 mg total) by mouth every 4 (four) hours as needed for severe pain. 60 tablet 0   No current facility-administered medications for this visit.    REVIEW OF SYSTEMS:   Constitutional: Denies fevers, chills or abnormal  night sweats, (+) weight loss and fatigue  Eyes: Denies blurriness of vision, double vision or watery eyes Ears, nose, mouth, throat, and face: Denies mucositis or sore throat Respiratory: Denies cough, (+) dyspnea on exertion, no wheezes Cardiovascular: Denies palpitation, chest discomfort or lower extremity swelling Gastrointestinal:  (+) nausea and abdominal pain at surgical site, no heartburn or change in bowel habits Skin: Denies abnormal skin rashes Lymphatics: Denies new lymphadenopathy or easy bruising Neurological:Denies numbness, tingling or new weaknesses Behavioral/Psych: Mood is stable, no new changes  All other systems were reviewed with the patient and are negative.  PHYSICAL EXAMINATION: ECOG PERFORMANCE STATUS: 2  Filed Vitals:   11/28/14 1320  BP: 181/91  Pulse: 60  Temp: 98.2 F (36.8 C)  Resp: 20   Filed Weights   11/28/14 1320  Weight: 141 lb 3.2 oz (64.048 kg)    GENERAL:alert, slightly distressed due to her pain SKIN: skin color, texture, turgor are normal, no rashes or significant lesions EYES: normal, conjunctiva are pink and non-injected, sclera  clear OROPHARYNX:no exudate, no erythema and lips, buccal mucosa, and tongue normal  NECK: supple, thyroid normal size, non-tender, without nodularity LYMPH:  no palpable lymphadenopathy in the cervical, axillary or inguinal LUNGS: clear to auscultation and percussion with normal breathing effort HEART: regular rate & rhythm and no murmurs and no lower extremity edema ABDOMEN:abdomen soft, (+) tenderness at middle and low abdomen with gentle palpitation, no rebound pain, normal bowel sounds, no organmegaly Musculoskeletal:no cyanosis of digits and no clubbing  PSYCH: alert & oriented x 3 with fluent speech NEURO: no focal motor/sensory deficits  LABORATORY DATA:  I have reviewed the data as listed Lab Results  Component Value Date   WBC 6.2 11/02/2014   HGB 9.5* 11/02/2014   HCT 27.4* 11/02/2014   MCV 96.1 11/02/2014   PLT 151 11/02/2014    Recent Labs  09/16/14 1815  09/21/14 0336 11/01/14 0939 11/02/14 0410  NA 139  < > 138 142 136*  K 3.7  < > 4.2 3.5* 3.6*  CL 102  < > 99 107 103  CO2 23  < > 28 21 20   GLUCOSE 102*  < > 91 88 109*  BUN 8  < > 3* 10 6  CREATININE 0.86  < > 0.78 0.69 0.66  CALCIUM 9.1  < > 9.2 9.0 8.7  GFRNONAA 76*  < > >90 >90 >90  GFRAA 88*  < > >90 >90 >90  PROT 7.6  --   --  6.6  --   ALBUMIN 3.7  --   --  3.4*  --   AST 17  --   --  17  --   ALT 8  --   --  8  --   ALKPHOS 92  --   --  66  --   BILITOT 0.4  --   --  0.3  --   < > = values in this interval not displayed.   PATHOLOGY REPORT 09/18/2014 Colon, segmental resection for tumor, terminal ileum and right - ULCERATED AND INVASIVE ADENOCARCINOMA SEE COMMENT. - NEGATIVE FOR LYMPH VASCULAR INVASION - NEGATIVE FOR PERINEURAL INVASION. 1 of 4 FINAL for Chiou, Kai [id number]) Diagnosis(continued) - TUMOR EXTENDS INTO PERICOLONIC SOFT TISSUE AND IS LESS THAN 1 MM FROM THE SEROSAL SURFACE. - TWENTY-THREE LYMPH NODES, NEGATIVE FOR TUMOR (0/23) - ONE SOFT TISSUE TUMOR DEPOSIT. -  SURGICAL MARGINS, NEGATIVE FOR TUMOR. Microscopic Comment COLON AND RECTUM (INCLUDING TRANS-ANAL RESECTION): Specimen: Terminal ileum and  right colon. Procedure: Right colectomy. Tumor site: Cecum. Specimen integrity: Intact. Macroscopic intactness of mesorectum: N/A Macroscopic tumor perforation: Absent. Invasive tumor: Maximum size: 4.7 cm Histologic type(s): Adenocarcinoma Histologic grade and differentiation: G2: moderately differentiated/low grade Type of polyp in which invasive carcinoma arose: Tubular adenoma with high grade dysplasia Microscopic extension of invasive tumor: Tumor extends into pericolonic soft tissue, see comment. Lymph-Vascular invasion: Absent. Peri-neural invasion: Absent. Tumor deposit(s) (discontinuous extramural extension): (x1) Resection margins: Proximal margin: Negative. Distal margin: Negative. Circumferential (radial) (posterior ascending, posterior descending; lateral and posterior mid-rectum; and entire lower 1/3 rectum): Negative Mesenteric margin (sigmoid and transverse): N/A Distance closest margin (if all above margins negative): 3.3 cm (distal) Trans-anal resection margins only: Deep margin: N/A Mucosal Margin: N/A Distance closest mucosal margin (if negative): N/A Treatment effect (neo-adjuvant therapy): None. Additional polyp(s): None. Non-neoplastic findings: None Lymph nodes: number examined- 23; number positive: 0 Pathologic Staging: pT3, pN1c, pMX, see comment. Ancillary studies: Per the [place] gastrointestinal working group guidelines, tumor will be submitted for both mismatch repair protein expression by immunohistochemistry and microsatellite instability testing by PCR. The results will be reported in an add on. Comment: Although tumor is not directly involving the serosa (visceral peritoneum), tumor is less than 1 mm from the serosal surface and is associated with fibroinflammatory inflammation involving the serosa.  Some studies consider this finding to represent serosal involvement.  Tumor MSI: STABLE   Ovary, left and cystic mass 11/01/2014 - MIXED ENDOMETRIOID AND CLEAR CELL CARCINOMA, FIGO GRADE III/III, OVARIAN PRIMARY. - FOCAL FALLOPIAN TUBE CARCINOMA IN SITU. - SEE ONCOLOGY TABLE BELOW.  Specimen(s): Left ovary and left fallopian tube. Procedure: (including lymph node sampling) Salpingo-oophorectomy. Primary tumor site (including laterality): Left ovary. Ovarian surface involvement: Present. Ovarian capsule intact without fragmentation: Disrupted. Maximum tumor size (cm): At least 3.7 cm. Histologic type: Mixed endometrioid and clear cell adenocarcinoma. Grade: III Peritoneal implants: (specify invasive or non-invasive): Not identified. Pelvic extension (list additional structures on separate lines and if involved): Not identified. Lymph nodes: number examined 0 ; number positive N/A TNM code: pT1c, pNX FIGO Stage (based on pathologic findings, needs clinical correlation): IC, at least. Comments: The carcinoma present in the current specimen is morphologically dissimilar from the tumor in the patient's previous colectomy, [phone]. The tumor cells in the current case are positive for cytokeratin 7 and estrogen receptor. There is faint staining for p53. Cytokeratin 20 and CDX2 are negative. 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 There is a small focus of carcinoma in situ present in fallopian tube epithelium in random tissue. Dr Claudette Laws has reviewed the case and concurs with the interpretation. The case was discussed with Dr Hulen Skains on 11/06/2014. (  RADIOGRAPHIC STUDIES: I have personally reviewed the radiological images as listed and agreed with the findings in the report.  Ct Abdomen Pelvis W Contrast 10/09/2014   IMPRESSION: Postoperative change consistent with resection of cecal tumor. Stable  9.1 cm solid and cystic mass in the left pelvis concerning for peritoneal metastasis.      ASSESSMENT & PLAN:  54 year old female with past medical history of arthritis, hysterectomy, history of abdominal bleeding status post embolization in 2005, who presented with abdominal pain nausea vomiting and weight loss. CT of abdomen showed a 3.8cm cecal mass with associated ileocecal intussusception, and a 9.1cm solid and cystic mass in the left pelvis, likely a peritoneal met with ascites. She is status post right hemicolectomy and terminal ileoectomy on  09/18/2014, and left Salpingo-oophorectomy on 11/01/14.   1. Right colon cancer, PT3pN1M0, stage III  -I reviewed her CT scan and the surgical pathology findings with her in details. Based on her surgical pathology, she has stage III disease.  -I recommend adjuvant chemotherapy for her locally advanced. I recommend her to get a port placement asap by Dr. Hulen Skains.  -The standard adjuvant regimen for colon cancer is for FOLFOX.[name] However giving her synchronized stage I colon cancer, which likely require adjuvant chemotherapy also, I will discuss her case with our oncological gynecologist in our cancer center to finalize the adjuvant chemotherapy plan. -Her tumor microsatellite stability test showed stable, she unlikely has Lynch syndrome.  2. Ovary cancer, left, pT1 cNX M0, at least stage I, mixed endometrioid and clear cell histology, grade 3, ER positive. - she was a likely need right oophorectomy and  Pelvic lymph nodes biopsy for staging.  -She is likely need adjuvant chemotherapy, the standard therapy is carboplatin and paclitaxel. -I'll discuss with our oncological gynecologist in our cancer center, to finalize her adjuvant chemotherapy regiment.   3. Coping and social support, history of depression and PTSD -She was very upset and overwhelmed when I discussed she has both colon and ovary cancer. She lives alone, does not want her children to be  involved much in her cancer care. I encouraged her to discuss with our social worker here. I expressed my emotional support to her.  4. Genetic counseling -Given her synchronized: And colon cancer, family history of brain and lung cancer, I'll refer her to see our genetic counselor for further genetic testing to ruled out Lynch syndrome or other inherited cancer syndrome.  Plan #1 RTC in 1 weeks  # port placement  Chemo class next week  Tentatively start chemotherapy in 2 weeks.  All questions were answered. The patient knows to call the clinic with any problems, questions or concerns. I spent 40 minutes counseling the patient face to face. The total time spent in the appointment was 60 minutes and more than 50% was on counseling.     Truitt Merle, MD 11/28/2014 9:35 PM  Addendum,  I spoke with GYN-onc Dr. Aldean Ast and discussed the management of her ovarian cancer. Dr. Arnell Sieving recommend her to have a right   Salpingo-oophorectomy,  And the pelvic lymph node biopsy for staging.   I will schedule her to see Dr. Denman George.  We will finalize her adjuvant chemotherapy regimen after her GYN surgery.   I discussed with the above with patient , she is agreeable.  I would ask Dr. Denman George to put a port during her surgery.  Truitt Merle 11/30/2014

## 2014-11-29 ENCOUNTER — Ambulatory Visit: Payer: Self-pay | Admitting: General Surgery

## 2014-11-29 ENCOUNTER — Other Ambulatory Visit: Payer: Self-pay | Admitting: *Deleted

## 2014-11-29 NOTE — Addendum Note (Signed)
Addended by: Elray Buba LE on: 11/29/2014 11:12 AM   Modules accepted: Orders

## 2014-11-30 MED ORDER — CIPROFLOXACIN IN D5W 400 MG/200ML IV SOLN: 400.0000 mg | INTRAVENOUS | Status: DC

## 2014-12-01 ENCOUNTER — Ambulatory Visit (HOSPITAL_COMMUNITY): Admission: RE | Admit: 2014-12-01 | Payer: Medicare Other | Source: Ambulatory Visit | Admitting: General Surgery

## 2014-12-01 ENCOUNTER — Encounter (HOSPITAL_COMMUNITY): Admission: RE | Payer: Self-pay | Source: Ambulatory Visit

## 2014-12-01 ENCOUNTER — Telehealth: Payer: Self-pay | Admitting: *Deleted

## 2014-12-01 SURGERY — INSERTION, TUNNELED CENTRAL VENOUS DEVICE, WITH PORT
Anesthesia: General

## 2014-12-01 NOTE — Telephone Encounter (Signed)
Called patient with appointment for 12/08/14 at 2:30pm - patient agreeable to this appt. Gave her our phone number if she needs to reschedule for any reason.

## 2014-12-05 ENCOUNTER — Telehealth: Payer: Self-pay | Admitting: Hematology

## 2014-12-05 ENCOUNTER — Ambulatory Visit (HOSPITAL_BASED_OUTPATIENT_CLINIC_OR_DEPARTMENT_OTHER): Payer: Medicare Other | Admitting: Hematology

## 2014-12-05 ENCOUNTER — Other Ambulatory Visit (HOSPITAL_BASED_OUTPATIENT_CLINIC_OR_DEPARTMENT_OTHER): Payer: Medicare Other

## 2014-12-05 ENCOUNTER — Encounter: Payer: Self-pay | Admitting: *Deleted

## 2014-12-05 ENCOUNTER — Encounter: Payer: Self-pay | Admitting: Hematology

## 2014-12-05 VITALS — BP 168/92 | HR 85 | Temp 98.3°F | Resp 18 | Ht 67.0 in | Wt 139.0 lb

## 2014-12-05 DIAGNOSIS — C180 Malignant neoplasm of cecum: Secondary | ICD-10-CM

## 2014-12-05 DIAGNOSIS — Z801 Family history of malignant neoplasm of trachea, bronchus and lung: Secondary | ICD-10-CM | POA: Diagnosis not present

## 2014-12-05 DIAGNOSIS — C562 Malignant neoplasm of left ovary: Secondary | ICD-10-CM | POA: Diagnosis not present

## 2014-12-05 DIAGNOSIS — R109 Unspecified abdominal pain: Secondary | ICD-10-CM | POA: Diagnosis not present

## 2014-12-05 DIAGNOSIS — F4310 Post-traumatic stress disorder, unspecified: Secondary | ICD-10-CM

## 2014-12-05 DIAGNOSIS — Z808 Family history of malignant neoplasm of other organs or systems: Secondary | ICD-10-CM

## 2014-12-05 DIAGNOSIS — F329 Major depressive disorder, single episode, unspecified: Secondary | ICD-10-CM | POA: Diagnosis not present

## 2014-12-05 DIAGNOSIS — C189 Malignant neoplasm of colon, unspecified: Secondary | ICD-10-CM

## 2014-12-05 DIAGNOSIS — C569 Malignant neoplasm of unspecified ovary: Secondary | ICD-10-CM | POA: Diagnosis not present

## 2014-12-05 LAB — CBC WITH DIFFERENTIAL/PLATELET
BASO%: 0.4 % (ref 0.0–2.0)
Basophils Absolute: 0 10*3/uL (ref 0.0–0.1)
EOS ABS: 0 10*3/uL (ref 0.0–0.5)
EOS%: 0.5 % (ref 0.0–7.0)
HCT: 36.8 % (ref 34.8–46.6)
HGB: 12.1 g/dL (ref 11.6–15.9)
LYMPH%: 36.6 % (ref 14.0–49.7)
MCH: 33.2 pg (ref 25.1–34.0)
MCHC: 33 g/dL (ref 31.5–36.0)
MCV: 100.8 fL (ref 79.5–101.0)
MONO#: 0.4 10*3/uL (ref 0.1–0.9)
MONO%: 7.2 % (ref 0.0–14.0)
NEUT%: 55.3 % (ref 38.4–76.8)
NEUTROS ABS: 3 10*3/uL (ref 1.5–6.5)
Platelets: 207 10*3/uL (ref 145–400)
RBC: 3.65 10*6/uL — ABNORMAL LOW (ref 3.70–5.45)
RDW: 15 % — AB (ref 11.2–14.5)
WBC: 5.5 10*3/uL (ref 3.9–10.3)
lymph#: 2 10*3/uL (ref 0.9–3.3)

## 2014-12-05 LAB — COMPREHENSIVE METABOLIC PANEL (CC13)
ALK PHOS: 77 U/L (ref 40–150)
ALT: 11 U/L (ref 0–55)
AST: 21 U/L (ref 5–34)
Albumin: 4.3 g/dL (ref 3.5–5.0)
Anion Gap: 9 mEq/L (ref 3–11)
BILIRUBIN TOTAL: 0.46 mg/dL (ref 0.20–1.20)
BUN: 11.2 mg/dL (ref 7.0–26.0)
CO2: 26 mEq/L (ref 22–29)
Calcium: 9.3 mg/dL (ref 8.4–10.4)
Chloride: 107 mEq/L (ref 98–109)
Creatinine: 0.8 mg/dL (ref 0.6–1.1)
EGFR: >90 mL/min/{1.73_m2} (ref >90)
Glucose: 104 mg/dl (ref 70–140)
Potassium: 3.8 mEq/L (ref 3.5–5.1)
SODIUM: 142 meq/L (ref 136–145)
Total Protein: 7.7 g/dL (ref 6.4–8.3)

## 2014-12-05 NOTE — Progress Notes (Signed)
[facility]  [phone] [phone]  Clinic New Consult Note   Patient Care Team: No Pcp Per Patient as PCP - General (General Practice) Yan Feng, MD as Consulting Physician (Hematology) Jay Wyatt, MD as Consulting Physician (General Surgery) Dionne Galloway, MD as Referring Physician (Obstetrics and Gynecology) 12/05/2014  CHIEF COMPLAINTS/PURPOSE OF CONSULTATION:  1. Colon cancer, pT3N1M0, stage III 2. Ovarian cancer, pT1cNxM0, stage I      Adenocarcinoma of colon   09/16/2014 Imaging CT abd/pel:  a 3.8cm cecal mass with associated ileocecal intussusception, and a 9.1cm solid and cystic mass in the left pelvis,   09/18/2014 Initial Diagnosis Adenocarcinoma of right colon   09/18/2014 Pathologic Stage pT3pN1Mx, tumor extends into pericolonic soft tissue and is less than 1mm from the serosal surface, LVI 8-), PNI (-), all 23 node negative, one soft tissue tumor deposit, surgical margins negative.    09/18/2014 Surgery right hemicolectomy and terminal ileoectomy    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   10/09/2014 Imaging CT of the abdomen and pelvis showed a 9.1 cm solid and cystic mass in the left pelvis concerning for malignancy. This was discovered during her workup for colon cancer.   11/01/2014 Initial Diagnosis 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   11/01/2014 Surgery Salpingo-oophorectomy.   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 Tumor size 3.7 cm, no lymph nodes removed. T1cNx. Tumor cells are positive for cytokeratin 7 and estrogen receptor.     HISTORY OF PRESENTING ILLNESS:  Catherine Rogers 54 y.o. female is here because of recently diagnosed colon cancer.   She presented with abdominal pain, nausea and vomiting, hemoptoses in Oct 2015.  She presented to local [facility] for worsening symptoms on 09/15/2014 and was subsequently transferred to [facility]. She had a CT scan that revealed a 3.8cm cecal mass with associated ileocecal intussusception, and a 9.1cm solid and cystic mass in the left pelvis, likely a peritoneal met with ascites.  EGD was negative. She had exploratory laparoscopy on 09/18/2014 by Dr. Wyatt, and underwent right hemicolectomy and ileocecectomy. The pathology reviewed also related an invasive adenocarcinoma at the terminal ileum and cecum. She was discharged home on 09/22/2014.  She underwent a second surgery for left pelvis cystic mass resection on 11/01/2014, surgical path showed ovarian cancer.  INTERIM HISTORY: Catherine Rogers returns for follow-up. She has felt much better in the past 1 week. See takes oxycodone as needed a few times a day for abdominal pain, and her pains much better controlled. Her appetite and eating has also significantly improved. She has more energy and able to do more things this week. She denies any fever or chills, no other new complaints.  MEDICAL HISTORY:  Past Medical History  Diagnosis Date  . Hypertension   . Ectopic pregnancy   . Complication of anesthesia     woke and saw long tube coming out of mouth . sat up then went back to sleep  . PONV (postoperative nausea and vomiting)   . Head injury, closed, with brief LOC     had a cut- as a child  . Asthma   . Anxiety   . Depression   . PTSD (post-traumatic stress disorder)   . GERD (gastroesophageal reflux disease)   . Arthritis   . Pelvic cyst   PTSD   SURGICAL HISTORY: Past Surgical History  Procedure Laterality Date  . Esophagogastroduodenoscopy N/A 09/17/2014    Procedure:   ESOPHAGOGASTRODUODENOSCOPY (EGD);  Surgeon: Marc E Magod, MD;  Location: [facility] END[facility];  Service: Endoscopy;  Laterality: N/A;  . Abdominal hysterectomy    . Tubal ligation    . Partial colectomy Right 09/18/2014    Procedure: RIGHT HEMI COLECTOMY;  Surgeon: Jay Wyatt, MD;  Location: [facility] OR;  Service: General;  Laterality: Right;  . Ileostomy Right  09/18/2014    Procedure: ILEOCOLOSTOMY;  Surgeon: Jay Wyatt, MD;  Location: [facility] OR;  Service: General;  Laterality: Right;  . Laparotomy N/A 11/01/2014    Procedure: EXPLORATORY LAPAROTOMY;  Surgeon: Jay Wyatt, MD;  Location: [facility] OR;  Service: General;  Laterality: N/A;  . Mass excision  11/01/2014    Procedure: EXCISION PELVIC MASS;  Surgeon: Jay Wyatt, MD;  Location: [facility] OR;  Service: General;;    SOCIAL HISTORY: History   Social History  . Marital Status: Single    Spouse Name: N/A    Number of Children: N/A  . Years of Education: N/A   Occupational History  . Not on file.   Social History Main Topics  . Smoking status: Current Some Day Smoker -- 0.25 packs/day for 5 years    Types: Cigars  . Smokeless tobacco: Never Used     Comment: every blue moon  . Alcohol Use: No     Comment: wine cooler special occasion  . Drug Use: Yes    Special: Marijuana     Comment: smokes marijuana twice a day for arthritis  . Sexual Activity: Not on file   Other Topics Concern  . Not on file   Social History Narrative    FAMILY HISTORY: Family History  Problem Relation Age of Onset  . Cancer Sister 60    brain cancer   . Cancer Brother 63    lung cancer     ALLERGIES:  is allergic to other; demerol; morphine and related; and penicillins.  MEDICATIONS:  Current Outpatient Prescriptions  Medication Sig Dispense Refill  . docusate sodium 100 MG CAPS Take 100 mg by mouth 2 (two) times daily as needed for mild constipation. 10 capsule 0  . hydrochlorothiazide (HYDRODIURIL) 25 MG tablet Take 25 mg by mouth daily.    . ibuprofen (ADVIL,MOTRIN) 800 MG tablet Take 800 mg by mouth every 8 (eight) hours as needed for mild pain.     . mirtazapine (REMERON) 15 MG tablet Take 1 tablet (15 mg total) by mouth at bedtime. 30 tablet 2  . ondansetron (ZOFRAN) 8 MG tablet Take 1 tablet (8 mg total) by mouth every 8 (eight) hours as needed for nausea or vomiting. 20 tablet 3  . oxyCODONE (OXY  IR/ROXICODONE) 5 MG immediate release tablet Take 1 tablet (5 mg total) by mouth every 4 (four) hours as needed for severe pain. 60 tablet 0  . ranitidine (ZANTAC) 150 MG tablet Take 150 mg by mouth 2 (two) times daily as needed for heartburn.      No current facility-administered medications for this visit.    REVIEW OF SYSTEMS:   Constitutional: Denies fevers, chills or abnormal night sweats, (+) weight loss and fatigue  Eyes: Denies blurriness of vision, double vision or watery eyes Ears, nose, mouth, throat, and face: Denies mucositis or sore throat Respiratory: Denies cough, (+) dyspnea on exertion, no wheezes Cardiovascular: Denies palpitation, chest discomfort or lower extremity swelling Gastrointestinal:  (+) nausea and abdominal pain at surgical site, no heartburn or change in bowel habits Skin: Denies abnormal skin rashes Lymphatics: Denies new lymphadenopathy or easy bruising   Neurological:Denies numbness, tingling or new weaknesses Behavioral/Psych: Mood is stable, no new changes  All other systems were reviewed with the patient and are negative.  PHYSICAL EXAMINATION: ECOG PERFORMANCE STATUS: 2  Filed Vitals:   12/05/14 1553  BP: 168/92  Pulse: 85  Temp: 98.3 F (36.8 C)  Resp: 18   Filed Weights   12/05/14 1553  Weight: 139 lb (63.05 kg)    GENERAL:alert, slightly distressed due to her pain SKIN: skin color, texture, turgor are normal, no rashes or significant lesions EYES: normal, conjunctiva are pink and non-injected, sclera clear OROPHARYNX:no exudate, no erythema and lips, buccal mucosa, and tongue normal  NECK: supple, thyroid normal size, non-tender, without nodularity LYMPH:  no palpable lymphadenopathy in the cervical, axillary or inguinal LUNGS: clear to auscultation and percussion with normal breathing effort HEART: regular rate & rhythm and no murmurs and no lower extremity edema ABDOMEN:abdomen soft, (+) tenderness at middle and low abdomen with  gentle palpitation, no rebound pain, normal bowel sounds, no organmegaly Musculoskeletal:no cyanosis of digits and no clubbing  PSYCH: alert & oriented x 3 with fluent speech NEURO: no focal motor/sensory deficits  LABORATORY DATA:  I have reviewed the data as listed Lab Results  Component Value Date   WBC 5.5 12/05/2014   HGB 12.1 12/05/2014   HCT 36.8 12/05/2014   MCV 100.8 12/05/2014   PLT 207 12/05/2014    Recent Labs  09/16/14 1815  09/21/14 0336 11/01/14 0939 11/02/14 0410 12/05/14 1520  NA 139  < > 138 142 136* 142  K 3.7  < > 4.2 3.5* 3.6* 3.8  CL 102  < > 99 107 103  --   CO2 23  < > _0 GLUCOSE 102*  < > 91 88 109* 104  BUN 8  < > 3* 10 6 11.2  CREATININE 0.86  < > 0.78 0.69 0.66 0.8  CALCIUM 9.1  < > 9.2 9.0 8.7 9.3  GFRNONAA 76*  < > >90 >90 >90  --   GFRAA 88*  < > >90 >90 >90  --   PROT 7.6  --   --  6.6  --  7.7  ALBUMIN 3.7  --   --  3.4*  --  4.3  AST 17  --   --  17  --  21  ALT 8  --   --  8  --  11  ALKPHOS 92  --   --  66  --  77  BILITOT 0.4  --   --  0.3  --  0.46  < > = values in this interval not displayed.   PATHOLOGY REPORT 09/18/2014 Colon, segmental resection for tumor, terminal ileum and right - ULCERATED AND INVASIVE ADENOCARCINOMA SEE COMMENT. - NEGATIVE FOR LYMPH VASCULAR INVASION - NEGATIVE FOR PERINEURAL INVASION. 1 of 4 FINAL for Hetz, Layloni [phone]) Diagnosis(continued) - TUMOR EXTENDS INTO PERICOLONIC SOFT TISSUE AND IS LESS THAN 1 MM FROM THE SEROSAL SURFACE. - TWENTY-THREE LYMPH NODES, NEGATIVE FOR TUMOR (0/23) - ONE SOFT TISSUE TUMOR DEPOSIT. - SURGICAL MARGINS, NEGATIVE FOR TUMOR. Microscopic Comment COLON AND RECTUM (INCLUDING TRANS-ANAL RESECTION): Specimen: Terminal ileum and right colon. Procedure: Right colectomy. Tumor site: Cecum. Specimen integrity: Intact. Macroscopic intactness of mesorectum: N/A Macroscopic tumor perforation: Absent. Invasive tumor: Maximum size: 4.7 cm Histologic  type(s): Adenocarcinoma Histologic grade and differentiation: G2: moderately differentiated/low grade Type of polyp in which invasive carcinoma arose: Tubular adenoma with high grade dysplasia Microscopic extension of  invasive tumor: Tumor extends into pericolonic soft tissue, see comment. Lymph-Vascular invasion: Absent. Peri-neural invasion: Absent. Tumor deposit(s) (discontinuous extramural extension): (x1) Resection margins: Proximal margin: Negative. Distal margin: Negative. Circumferential (radial) (posterior ascending, posterior descending; lateral and posterior mid-rectum; and entire lower 1/3 rectum): Negative Mesenteric margin (sigmoid and transverse): N/A Distance closest margin (if all above margins negative): 3.3 cm (distal) Trans-anal resection margins only: Deep margin: N/A Mucosal Margin: N/A Distance closest mucosal margin (if negative): N/A Treatment effect (neo-adjuvant therapy): None. Additional polyp(s): None. Non-neoplastic findings: None Lymph nodes: number examined- 23; number positive: 0 Pathologic Staging: pT3, pN1c, pMX, see comment. Ancillary studies: Per the [place] gastrointestinal working group guidelines, tumor will be submitted for both mismatch repair protein expression by immunohistochemistry and microsatellite instability testing by PCR. The results will be reported in an add on. Comment: Although tumor is not directly involving the serosa (visceral peritoneum), tumor is less than 1 mm from the serosal surface and is associated with fibroinflammatory inflammation involving the serosa. Some studies consider this finding to represent serosal involvement.  Tumor MSI: STABLE   Ovary, left and cystic mass 11/01/2014 - MIXED ENDOMETRIOID AND CLEAR CELL CARCINOMA, FIGO GRADE III/III, OVARIAN PRIMARY. - FOCAL FALLOPIAN TUBE CARCINOMA IN SITU. - SEE ONCOLOGY TABLE BELOW.  Specimen(s): Left ovary and left fallopian tube. Procedure: (including lymph  node sampling) Salpingo-oophorectomy. Primary tumor site (including laterality): Left ovary. Ovarian surface involvement: Present. Ovarian capsule intact without fragmentation: Disrupted. Maximum tumor size (cm): At least 3.7 cm. Histologic type: Mixed endometrioid and clear cell adenocarcinoma. Grade: III Peritoneal implants: (specify invasive or non-invasive): Not identified. Pelvic extension (list additional structures on separate lines and if involved): Not identified. Lymph nodes: number examined 0 ; number positive N/A TNM code: pT1c, pNX FIGO Stage (based on pathologic findings, needs clinical correlation): IC, at least. Comments: The carcinoma present in the current specimen is morphologically dissimilar from the tumor in the patient's previous colectomy, [phone]. The tumor cells in the current case are positive for cytokeratin 7 and estrogen receptor. There is faint staining for p53. Cytokeratin 20 and CDX2 are negative. 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 There is a small focus of carcinoma in situ present in fallopian tube epithelium in random tissue. Dr Claudette Laws has reviewed the case and concurs with the interpretation. The case was discussed with Dr Hulen Skains on 11/06/2014. (  RADIOGRAPHIC STUDIES: I have personally reviewed the radiological images as listed and agreed with the findings in the report.  Ct Abdomen Pelvis W Contrast 10/09/2014   IMPRESSION: Postoperative change consistent with resection of cecal tumor. Stable 9.1 cm solid and cystic mass in the left pelvis concerning for peritoneal metastasis.      ASSESSMENT & PLAN:  54 year old female with past medical history of arthritis, hysterectomy, history of abdominal bleeding status post embolization in 2005, who presented with abdominal pain nausea vomiting and weight loss. CT of abdomen showed a 3.8cm cecal mass with  associated ileocecal intussusception, and a 9.1cm solid and cystic mass in the left pelvis, likely a peritoneal met with ascites. She is status post right hemicolectomy and terminal ileoectomy on 09/18/2014, and left Salpingo-oophorectomy on 11/01/14.   1. Right colon cancer, PT3pN1M0, stage III  -I reviewed her CT scan and the surgical pathology findings with her in details. Based on her surgical pathology, she has stage III disease.  -I recommend adjuvant chemotherapy for her locally advanced. She will have a port  placement by Dr. Denman George during her next surgery. -The standard adjuvant regimen for colon cancer is for FOLFOX.[name] However giving her synchronized stage I colon cancer, which likely require adjuvant chemotherapy also, I will discuss her case with our oncological gynecologist Dr. Denman George in our cancer center to finalize the adjuvant chemotherapy plan. She is scheduled to see Dr. Denman George this Friday. -Her tumor microsatellite stability test showed stable, she unlikely has Lynch syndrome.  2. Ovary cancer, left, pT1 cNX M0, at least stage I, mixed endometrioid and clear cell histology, grade 3, ER positive. - she was a likely need right oophorectomy and  Pelvic lymph nodes biopsy for staging. She is scheduled to see Dr. Denman George this Friday. -She is likely need adjuvant chemotherapy, the standard therapy is carboplatin and paclitaxel for over an cancer. -I'll discuss with Dr. Denman George to finalize her adjuvant chemotherapy regiment.   3. abdominal pain -Likely related to her surgery and chronic pain -Continue use oxycodone as needed.  4. Coping and social support, history of depression and PTSD -She has had some difficulty coping with her synchronized colon and ovarian cancer. She lives alone, does not want her children to be involved much in her cancer care. I encouraged her to discuss with our social worker here. I expressed my emotional support to her.  5. Genetic counseling -Given her  synchronized: And colon cancer, family history of brain and lung cancer, I'll refer her to see our genetic counselor for further genetic testing to ruled out Lynch syndrome or other inherited cancer syndrome.  Plan #1 RTC in 4 weeks to finalize her adjuvant therapy regiment. I anticipate she is can have GYN surgery in a few weeks.  All questions were answered. The patient knows to call the clinic with any problems, questions or concerns. I spent 10 minutes counseling the patient face to face. The total time spent in the appointment was 15 minutes and more than 50% was on counseling.     Truitt Merle, MD 12/05/2014 4:14 PM

## 2014-12-05 NOTE — Telephone Encounter (Signed)
gv adn printed appt sched anda vs for pt for Feb °

## 2014-12-05 NOTE — Progress Notes (Signed)
[place] Psychosocial Distress Screening Clinical Social Work  Clinical Social Work was referred by distress screening protocol.  The patient scored a 8 on the Psychosocial Distress Thermometer which indicates moderate distress. Clinical Social Worker contacted patient at hime to assess for distress and other psychosocial needs.  Patient stated she felt she need more assistance at home, and had contacted social services last week to submitted a request for home care.  Patient has not received any information regarding her request.  CSW encouraged patient to call CSW if she does not hear back from social services by next week.  CSW and patient also discussed the support team and support services at [facility].  Patient expressed some feelings of loneliness and stated she becomes tearful at times.  CSW validated patients feeling and discussed common feelings and emotions when going through treatment.  CSW and patient plan to meet today during patients appointment to further discuss support services and patients needs.  CSW encouraged patient to call with needs or concerns.        Clinical Social Worker follow up needed: Yes.    If yes, follow up plan: CSW and patient scheduled to meet during appointment today   Kinde 11/28/2014  Distress experienced in past week (1-10) 8  Practical problem type Housing;Insurance;Food  Family Problem type Partner  Emotional problem type Depression;Nervousness/Anxiety;Adjusting to illness;Isolation/feeling alone;Feeling hopeless;Boredom;Adjusting to appearance changes  Spiritual/Religous concerns type Relating to God  Physical Problem type Pain;Nausea/vomiting;Sleep/insomnia;Bathing/dressing;Loss of appetitie;Changes in urination;Skin dry/itchy  Physician notified of physical symptoms Yes  Referral to clinical social work Yes  Other referral to Education officer, museum.   Johnnye Lana, MSW, LCSW, OSW-C Clinical Social Worker [facility] [phone]

## 2014-12-06 LAB — CEA: CEA: 0.9 ng/mL (ref 0.0–5.0)

## 2014-12-06 LAB — CA 125: CA 125: 10 U/mL (ref <35)

## 2014-12-08 ENCOUNTER — Encounter: Payer: Self-pay | Admitting: Gynecologic Oncology

## 2014-12-08 ENCOUNTER — Ambulatory Visit: Payer: Medicare Other | Attending: Gynecologic Oncology | Admitting: Gynecologic Oncology

## 2014-12-08 VITALS — BP 190/110 | HR 87 | Temp 98.3°F | Resp 18 | Ht 67.0 in | Wt 136.3 lb

## 2014-12-08 DIAGNOSIS — F17210 Nicotine dependence, cigarettes, uncomplicated: Secondary | ICD-10-CM | POA: Insufficient documentation

## 2014-12-08 DIAGNOSIS — F419 Anxiety disorder, unspecified: Secondary | ICD-10-CM | POA: Diagnosis not present

## 2014-12-08 DIAGNOSIS — F4310 Post-traumatic stress disorder, unspecified: Secondary | ICD-10-CM | POA: Insufficient documentation

## 2014-12-08 DIAGNOSIS — Z880 Allergy status to penicillin: Secondary | ICD-10-CM | POA: Insufficient documentation

## 2014-12-08 DIAGNOSIS — C569 Malignant neoplasm of unspecified ovary: Secondary | ICD-10-CM

## 2014-12-08 DIAGNOSIS — K219 Gastro-esophageal reflux disease without esophagitis: Secondary | ICD-10-CM | POA: Diagnosis not present

## 2014-12-08 DIAGNOSIS — I10 Essential (primary) hypertension: Secondary | ICD-10-CM | POA: Insufficient documentation

## 2014-12-08 DIAGNOSIS — F329 Major depressive disorder, single episode, unspecified: Secondary | ICD-10-CM | POA: Diagnosis not present

## 2014-12-08 DIAGNOSIS — J45909 Unspecified asthma, uncomplicated: Secondary | ICD-10-CM | POA: Diagnosis not present

## 2014-12-08 DIAGNOSIS — C189 Malignant neoplasm of colon, unspecified: Secondary | ICD-10-CM | POA: Diagnosis not present

## 2014-12-08 DIAGNOSIS — M1990 Unspecified osteoarthritis, unspecified site: Secondary | ICD-10-CM | POA: Insufficient documentation

## 2014-12-08 DIAGNOSIS — Z885 Allergy status to narcotic agent status: Secondary | ICD-10-CM | POA: Diagnosis not present

## 2014-12-08 DIAGNOSIS — Z90710 Acquired absence of both cervix and uterus: Secondary | ICD-10-CM | POA: Insufficient documentation

## 2014-12-08 DIAGNOSIS — Z79899 Other long term (current) drug therapy: Secondary | ICD-10-CM | POA: Diagnosis not present

## 2014-12-08 NOTE — Patient Instructions (Addendum)
Appointment with New Primary Care Doctor on January 22 at 10:30am with Dr. Elna Breslow.   Address: [address], [place], [place] ([place])    Preparing for your Surgery  Plan for surgery on February 2 with Dr. Denman George.  Pre-operative Testing -You will receive a phone call from presurgical testing at [facility] to arrange for a pre-operative testing appointment before your surgery.  This appointment normally occurs one to two weeks before your scheduled surgery.   -Bring your insurance card, copy of an advanced directive if applicable, medication list  -At that visit, you will be asked to sign a consent for a possible blood transfusion in case a transfusion becomes necessary during surgery.  The need for a blood transfusion is rare but having consent is a necessary part of your care.     -You should not be taking blood thinners or aspirin at least ten days prior to surgery unless instructed by your surgeon.  Day Before Surgery at [facility] will be asked to take in only clear liquids the day before surgery.  Examples of clear liquids include broths, jello, and clear juices.  You will be advised to have nothing to eat or drink after midnight the evening before.    Your role in recovery Your role is to become active as soon as directed by your doctor, while still giving yourself time to heal.  Rest when you feel tired. You will be asked to do the following in order to speed your recovery:  - Cough and breathe deeply. This helps toclear and expand your lungs and can prevent pneumonia. You may be given a spirometer to practice deep breathing. A staff member will show you how to use the spirometer. - Do mild physical activity. Walking or moving your legs help your circulation and body functions return to normal. A staff member will help you when you try to walk and will provide you with simple exercises. Do not try to get up or walk alone the first time. - Actively manage your  pain. Managing your pain lets you move in comfort. We will ask you to rate your pain on a scale of zero to 10. It is your responsibility to tell your doctor or nurse where and how much you hurt so your pain can be treated.  Special Considerations -If you are diabetic, you may be placed on insulin after surgery to have closer control over your blood sugars to promote healing and recovery.  This does not mean that you will be discharged on insulin.  If applicable, your oral antidiabetics will be resumed when you are tolerating a solid diet.  -Your final pathology results from surgery should be available by the Friday after surgery and the results will be relayed to you when available.     Blood Transfusion Information WHAT IS A BLOOD TRANSFUSION? A transfusion is the replacement of blood or some of its parts. Blood is made up of multiple cells which provide different functions.  Red blood cells carry oxygen and are used for blood loss replacement.  White blood cells fight against infection.  Platelets control bleeding.  Plasma helps clot blood.  Other blood products are available for specialized needs, such as hemophilia or other clotting disorders. BEFORE THE TRANSFUSION  Who gives blood for transfusions?   You may be able to donate blood to be used at a later date on yourself (autologous donation).  Relatives can be asked to donate blood. This is generally not any  safer than if you have received blood from a stranger. The same precautions are taken to ensure safety when a relative's blood is donated.  Healthy volunteers who are fully evaluated to make sure their blood is safe. This is blood bank blood. Transfusion therapy is the safest it has ever been in the practice of medicine. Before blood is taken from a donor, a complete history is taken to make sure that person has no history of diseases nor engages in risky social behavior (examples are intravenous drug use or sexual activity  with multiple partners). The donor's travel history is screened to minimize risk of transmitting infections, such as malaria. The donated blood is tested for signs of infectious diseases, such as HIV and hepatitis. The blood is then tested to be sure it is compatible with you in order to minimize the chance of a transfusion reaction. If you or a relative donates blood, this is often done in anticipation of surgery and is not appropriate for emergency situations. It takes many days to process the donated blood. RISKS AND COMPLICATIONS Although transfusion therapy is very safe and saves many lives, the main dangers of transfusion include:   Getting an infectious disease.  Developing a transfusion reaction. This is an allergic reaction to something in the blood you were given. Every precaution is taken to prevent this. The decision to have a blood transfusion has been considered carefully by your caregiver before blood is given. Blood is not given unless the benefits outweigh the risks.

## 2014-12-08 NOTE — Progress Notes (Signed)
Consult Note: Gyn-Onc  Consult was requested by Dr. Hulen Skains and Dr Burr Medico for the evaluation of Catherine Rogers 54 y.o. female with synchronous colon and ovarian cancer  CC:  Chief Complaint  Patient presents with  . Ovarian Cancer    Assessment/Plan:  Catherine Rogers  is a 54 y.o.  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  She requires chemotherapy with FOLFOX for her stage III colon cancer. It is unclear what her stage ovarian cancer is however due to the high-grade nature and the clear cell component on histology we would recommend 6 cycles of carboplatin and paclitaxel chemotherapy regardless of stage. On CT scan performed in November 2015 there was no gross evidence of extraovarian disease, no bulky disease that would necessitate debulking. Therefore the only real benefit to a surgical staging that I see would be to delineate which malignancy has the more advanced stage. If her ovarian cancer is truly a stage I then adjuvant chemotherapy showed focus on her more advanced colon cancer preferentially. She will still derives some benefit from the platinum in this regimen. Alternatively if a more advanced ovarian cancer is identified and staging we will need to tailor her chemotherapy to target both malignancies optimally.  I discussed this with the patient and the relatively limited benefit of staging (only being to help guide chemotherapy choices) an alternative would be to proceed with FOLFOX chemotherapy now, restage with a CT scan after treatment, and performed an oophorectomy of the right side after chemotherapy. However the patient desires the most informative approach with surgery upfront. We have scheduled her for a robotic assisted RSO, omentectomy and lymphadenectomy for 12/26/14.    I discussed that surgical risks are higher when she's had multiple surgeries back to back. I discussed that the major risks include  bleeding, infection, damage to  internal organs (such as bladder,ureters, bowels), blood clot, reoperation and rehospitalization. I discussed that she may be a good candidate for a minimally invasive approach with robotic assisted right stopping oophorectomy, omentectomy, lymphadenectomy. I discussed that she is at high risk for requiring conversion to laparotomy due to her recent prior surgeries and the potential for adhesive disease.   I agree with Dr. Ernestina Penna plan to pursue genetics testing as this patient is at high risk for carrying limb syndrome.   HPI: Catherine Rogers  is a 54 year old para 7 who is seen in consultation at the request of Dr. Burr Medico and Dr. 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 The patient has a history of abdominal pain for several months. This culminated in symptoms of a bowel obstruction in October 2015 for which she underwent imaging with a CT of the abdomen and pelvis on 09/16/2014. This revealed a 3.8 cm cecal mass with intussusception, and a 9.1 cm left ovarian mass. On 09/18/2014 she underwent an expiratory laparotomy, right hemicolectomy terminal ileoectomy with Dr. Hulen Skains. 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 Postoperatively she underwent additional staging imaging with a CT scan on 10/09/2014 this demonstrated a 9.1 cm solid cystic mass in the left pelvis. On 11/01/2014 she underwent expiratory laparotomy and left salpingo-oophorectomy which was positive for mixed endometrioid and clear cell carcinoma, FIGO grade 3, ovarian primary. Dr. Hulen Skains reports that there was no other frank evidence of macroscopic disease during surgery.  She sought consultation with treating oncologist Dr. Burr Medico who is recommending  FOLFOX chemotherapy for his stage III colon cancer.   Interval History: She is recovering slowly from her second surgery. She is improving diet and energy. She still has some lower  abdominal pain at the incision site. Her son was diagnosed with a stroke yesterday.  Current Meds:  Outpatient Encounter Prescriptions as of 12/08/2014  Medication Sig  . docusate sodium 100 MG CAPS Take 100 mg by mouth 2 (two) times daily as needed for mild constipation.  . hydrochlorothiazide (HYDRODIURIL) 25 MG tablet Take 25 mg by mouth daily.  [name] ibuprofen (ADVIL,MOTRIN) 800 MG tablet Take 800 mg by mouth every 8 (eight) hours as needed for mild pain.   . methocarbamol (ROBAXIN) 500 MG tablet   . mirtazapine (REMERON) 15 MG tablet Take 1 tablet (15 mg total) by mouth at bedtime.  . ondansetron (ZOFRAN) 8 MG tablet Take 1 tablet (8 mg total) by mouth every 8 (eight) hours as needed for nausea or vomiting.  [name] oxyCODONE (OXY IR/ROXICODONE) 5 MG immediate release tablet Take 1 tablet (5 mg total) by mouth every 4 (four) hours as needed for severe pain.  . ranitidine (ZANTAC) 150 MG tablet Take 150 mg by mouth 2 (two) times daily as needed for heartburn.   . [DISCONTINUED] HYDROcodone-acetaminophen (NORCO/VICODIN) 5-325 MG per tablet     Allergy:  Allergies  Allergen Reactions  . Other Hives and Itching    Highly acidic foods "make me itch and break out" oranges, tomatoes  . Demerol [Meperidine] Nausea And Vomiting  . Morphine And Related Nausea And Vomiting  . Penicillins Other (See Comments)    Childhood allergy    Social Hx:   History   Social History  . Marital Status: Single    Spouse Name: N/A    Number of Children: N/A  . Years of Education: N/A   Occupational History  . Not on file.   Social History Main Topics  . Smoking status: Current Some Day Smoker -- 0.25 packs/day for 5 years    Types: Cigars  . Smokeless tobacco: Never Used     Comment: every blue moon  . Alcohol Use: No     Comment: wine cooler special occasion  . Drug Use: Yes    Special: Marijuana     Comment: smokes marijuana twice a day for arthritis  . Sexual Activity: No   Other Topics Concern   . Not on file   Social History Narrative    Past Surgical Hx:  Past Surgical History  Procedure Laterality Date  . Esophagogastroduodenoscopy N/A 09/17/2014    Procedure: ESOPHAGOGASTRODUODENOSCOPY (EGD);  Surgeon: Jeryl Columbia, MD;  [facility] END[facility]COPY;  Service: Endoscopy;  Laterality: N/A;  . Abdominal hysterectomy    . Tubal ligation    . Partial colectomy Right 09/18/2014    Procedure: RIGHT HEMI COLECTOMY;  Surgeon: Doreen Salvage, MD;  Location: [place];  Service: General;  Laterality: Right;  . Ileostomy Right 09/18/2014    Procedure: ILEOCOLOSTOMY;  Surgeon: Doreen Salvage, MD;  Location: [place];  Service: General;  Laterality: Right;  . Laparotomy N/A 11/01/2014    Procedure: EXPLORATORY LAPAROTOMY;  Surgeon: Doreen Salvage, MD;  Location: [place];  Service: General;  Laterality: N/A;  . Mass excision  11/01/2014    Procedure: EXCISION PELVIC MASS;  Surgeon: Doreen Salvage, MD;  Location: [place];  Service: General;;    Past Medical Hx:  Past Medical History  Diagnosis Date  . Hypertension   . Ectopic pregnancy   .  Complication of anesthesia     woke and saw long tube coming out of mouth . sat up then went back to sleep  . PONV (postoperative nausea and vomiting)   . Head injury, closed, with brief LOC     had a cut- as a child  . Asthma   . Anxiety   . Depression   . PTSD (post-traumatic stress disorder)   . GERD (gastroesophageal reflux disease)   . Arthritis   . Pelvic cyst     Past Gynecological History:  7 x SVD, prior vaginal hysterectomy for fibroids.  No LMP recorded. Patient has had a hysterectomy.  Family Hx:  Family History  Problem Relation Age of Onset  . Cancer Sister 77    brain cancer   . Cancer Brother 63    lung cancer     Review of Systems:  Constitutional  Feels well,    ENT Normal appearing ears and nares bilaterally Skin/Breast  No rash, sores, jaundice, itching, dryness Cardiovascular  No chest pain, shortness of breath, or edema  Pulmonary  No  cough or wheeze.  Gastro Intestinal  No nausea, vomitting, or diarrhoea. No bright red blood per rectum, no abdominal pain, change in bowel movement, or constipation.  Genito Urinary  No frequency, urgency, dysuria, see HPI Musculo Skeletal  No myalgia, arthralgia, joint swelling or pain  Neurologic  No weakness, numbness, change in gait,  Psychology  No depression, anxiety, insomnia.   Vitals:  Blood pressure 190/110, pulse 87, temperature 98.3 F (36.8 C), temperature source Oral, resp. rate 18, height 5\' 7"  (1.702 m), weight 136 lb 4.8 oz (61.825 kg).  Physical Exam: WD in NAD Neck  Supple NROM, without any enlargements.  Lymph Node Survey No cervical supraclavicular or inguinal adenopathy Cardiovascular  Pulse normal rate, regularity and rhythm. S1 and S2 normal.  Lungs  Clear to auscultation bilateraly, without wheezes/crackles/rhonchi. Good air movement.  Skin  No rash/lesions/breakdown  Psychiatry  Alert and oriented to person, place, and time  Abdomen  Normoactive bowel sounds, abdomen soft, non-tender and thin without evidence of hernia. Midline incision healing well Back No CVA tenderness Genito Urinary  Vulva/vagina: Normal external female genitalia.  No lesions. No discharge or bleeding.  Bladder/urethra:  No lesions or masses, well supported bladder  Vagina: no lesions  Cervix: surgically absent  Uterus: surgically absent   Adnexa: no palpable masses. Rectal  Good tone, no masses no cul de sac nodularity.  Extremities  No bilateral cyanosis, clubbing or edema.   [name], MD   12/08/2014, 5:27 PM

## 2014-12-11 ENCOUNTER — Telehealth: Payer: Self-pay | Admitting: *Deleted

## 2014-12-11 DIAGNOSIS — C562 Malignant neoplasm of left ovary: Secondary | ICD-10-CM

## 2014-12-11 DIAGNOSIS — C189 Malignant neoplasm of colon, unspecified: Secondary | ICD-10-CM

## 2014-12-11 MED ORDER — OXYCODONE HCL 5 MG PO TABS: 5.0000 mg | ORAL_TABLET | ORAL | Status: DC | PRN

## 2014-12-11 NOTE — Telephone Encounter (Signed)
Received phone call from pt requesting refill on her pain med, oxycodone.  She states she hurts everywhere.  Informed we would get ready for her to pick up.

## 2014-12-13 ENCOUNTER — Telehealth: Payer: Self-pay | Admitting: *Deleted

## 2014-12-13 NOTE — Telephone Encounter (Signed)
Patient called requesting if her prescription has been picked up yet.  Lives in [place] and hasn't heard from the person she asked to pick up the script.  Per injection nurse Mindy the prescription is here.  Notified Turessa to pick this up with a picture ID before 4:00 pm.

## 2014-12-15 ENCOUNTER — Ambulatory Visit: Payer: Medicare Other | Admitting: Family

## 2014-12-20 NOTE — Patient Instructions (Addendum)
Catherine Rogers  12/20/2014   Your procedure is scheduled on: 12/26/2014    Report to [facility] Main  Entrance and follow signs to               [place] at     [place]  Call this number if you have problems the morning of surgery [phone]   Remember:  Clear liquid diet beginning on Monday 12/25/2014.              Then nothing to eat or drink after midnite.        Take these medicines the morning of surgery with A SIP OF WATER:  Oxycodone if needed, Zantac if needed                                You may not have any metal on your body including hair pins and              piercings  Do not wear jewelry, make-up, lotions, powders or perfumes., deodorant             Do not wear nail polish.  Do not shave  48 hours prior to surgery.                 Do not bring valuables to the hospital. [place].  Contacts, dentures or bridgework may not be worn into surgery.  Leave suitcase in the car. After surgery it may be brought to your room.         Special Instructions:coughing and deep breathing exercises, leg exercises               Please read over the following fact sheets you were given: _____________________________________________________________________             [facility] - Preparing for Surgery Before surgery, you can play an important role.  Because skin is not sterile, your skin needs to be as free of germs as possible.  You can reduce the number of germs on your skin by washing with CHG (chlorahexidine gluconate) soap before surgery.  CHG is an antiseptic cleaner which kills germs and bonds with the skin to continue killing germs even after washing. Please DO NOT use if you have an allergy to CHG or antibacterial soaps.  If your skin becomes reddened/irritated stop using the CHG and inform your nurse when you arrive at Short Stay. Do not shave (including legs and underarms) for at least 48  hours prior to the first CHG shower.  You may shave your face/neck. Please follow these instructions carefully:  1.  Shower with CHG Soap the night before surgery and the  morning of Surgery.  2.  If you choose to wash your hair, wash your hair first as usual with your  normal  shampoo.  3.  After you shampoo, rinse your hair and body thoroughly to remove the  shampoo.                           4.  Use CHG as you would any other liquid soap.  You can apply chg directly  to the skin and wash  Gently with a scrungie or clean washcloth.  5.  Apply the CHG Soap to your body ONLY FROM THE NECK DOWN.   Do not use on face/ open                           Wound or open sores. Avoid contact with eyes, ears mouth and genitals (private parts).                       Wash face,  Genitals (private parts) with your normal soap.             6.  Wash thoroughly, paying special attention to the area where your surgery  will be performed.  7.  Thoroughly rinse your body with warm water from the neck down.  8.  DO NOT shower/wash with your normal soap after using and rinsing off  the CHG Soap.                9.  Pat yourself dry with a clean towel.            10.  Wear clean pajamas.            11.  Place clean sheets on your bed the night of your first shower and do not  sleep with pets. Day of Surgery : Do not apply any lotions/deodorants the morning of surgery.  Please wear clean clothes to the hospital/surgery center.  FAILURE TO FOLLOW THESE INSTRUCTIONS MAY RESULT IN THE CANCELLATION OF YOUR SURGERY PATIENT SIGNATURE_________________________________  NURSE SIGNATURE__________________________________  ________________________________________________________________________  WHAT IS A BLOOD TRANSFUSION? Blood Transfusion Information  A transfusion is the replacement of blood or some of its parts. Blood is made up of multiple cells which provide different functions.  Red blood cells  carry oxygen and are used for blood loss replacement.  White blood cells fight against infection.  Platelets control bleeding.  Plasma helps clot blood.  Other blood products are available for specialized needs, such as hemophilia or other clotting disorders. BEFORE THE TRANSFUSION  Who gives blood for transfusions?   Healthy volunteers who are fully evaluated to make sure their blood is safe. This is blood bank blood. Transfusion therapy is the safest it has ever been in the practice of medicine. Before blood is taken from a donor, a complete history is taken to make sure that person has no history of diseases nor engages in risky social behavior (examples are intravenous drug use or sexual activity with multiple partners). The donor's travel history is screened to minimize risk of transmitting infections, such as malaria. The donated blood is tested for signs of infectious diseases, such as HIV and hepatitis. The blood is then tested to be sure it is compatible with you in order to minimize the chance of a transfusion reaction. If you or a relative donates blood, this is often done in anticipation of surgery and is not appropriate for emergency situations. It takes many days to process the donated blood. RISKS AND COMPLICATIONS Although transfusion therapy is very safe and saves many lives, the main dangers of transfusion include:  1. Getting an infectious disease. 2. Developing a transfusion reaction. This is an allergic reaction to something in the blood you were given. Every precaution is taken to prevent this. The decision to have a blood transfusion has been considered carefully by your caregiver before blood is given. Blood is not given unless the benefits outweigh  the risks. AFTER THE TRANSFUSION  Right after receiving a blood transfusion, you will usually feel much better and more energetic. This is especially true if your red blood cells have gotten low (anemic). The transfusion  raises the level of the red blood cells which carry oxygen, and this usually causes an energy increase.  The nurse administering the transfusion will monitor you carefully for complications. HOME CARE INSTRUCTIONS  No special instructions are needed after a transfusion. You may find your energy is better. Speak with your caregiver about any limitations on activity for underlying diseases you may have. SEEK MEDICAL CARE IF:   Your condition is not improving after your transfusion.  You develop redness or irritation at the intravenous (IV) site. SEEK IMMEDIATE MEDICAL CARE IF:  Any of the following symptoms occur over the next 12 hours:  Shaking chills.  You have a temperature by mouth above 102 F (38.9 C), not controlled by medicine.  Chest, back, or muscle pain.  People around you feel you are not acting correctly or are confused.  Shortness of breath or difficulty breathing.  Dizziness and fainting.  You get a rash or develop hives.  You have a decrease in urine output.  Your urine turns a dark color or changes to pink, red, or brown. Any of the following symptoms occur over the next 10 days:  You have a temperature by mouth above 102 F (38.9 C), not controlled by medicine.  Shortness of breath.  Weakness after normal activity.  The white part of the eye turns yellow (jaundice).  You have a decrease in the amount of urine or are urinating less often.  Your urine turns a dark color or changes to pink, red, or brown. Document Released: 11/07/2000 Document Revised: 02/02/2012 Document Reviewed: 06/26/2008 ExitCare Patient Information 2014 [place].  _______________________________________________________________________  Incentive Spirometer  An incentive spirometer is a tool that can help keep your lungs clear and active. This tool measures how well you are filling your lungs with each breath. Taking long deep breaths may help reverse or decrease the chance  of developing breathing (pulmonary) problems (especially infection) following:  A long period of time when you are unable to move or be active. BEFORE THE PROCEDURE   If the spirometer includes an indicator to show your best effort, your nurse or respiratory therapist will set it to a desired goal.  If possible, sit up straight or lean slightly forward. Try not to slouch.  Hold the incentive spirometer in an upright position. INSTRUCTIONS FOR USE  3. Sit on the edge of your bed if possible, or sit up as far as you can in bed or on a chair. 4. Hold the incentive spirometer in an upright position. 5. Breathe out normally. 6. Place the mouthpiece in your mouth and seal your lips tightly around it. 7. Breathe in slowly and as deeply as possible, raising the piston or the ball toward the top of the column. 8. Hold your breath for 3-5 seconds or for as long as possible. Allow the piston or ball to fall to the bottom of the column. 9. Remove the mouthpiece from your mouth and breathe out normally. 10. Rest for a few seconds and repeat Steps 1 through 7 at least 10 times every 1-2 hours when you are awake. Take your time and take a few normal breaths between deep breaths. 11. The spirometer may include an indicator to show your best effort. Use the indicator as a goal to work  toward during each repetition. 12. After each set of 10 deep breaths, practice coughing to be sure your lungs are clear. If you have an incision (the cut made at the time of surgery), support your incision when coughing by placing a pillow or rolled up towels firmly against it. Once you are able to get out of bed, walk around indoors and cough well. You may stop using the incentive spirometer when instructed by your caregiver.  RISKS AND COMPLICATIONS  Take your time so you do not get dizzy or light-headed.  If you are in pain, you may need to take or ask for pain medication before doing incentive spirometry. It is harder to  take a deep breath if you are having pain. AFTER USE  Rest and breathe slowly and easily.  It can be helpful to keep track of a log of your progress. Your caregiver can provide you with a simple table to help with this. If you are using the spirometer at home, follow these instructions: [place] IF:   You are having difficultly using the spirometer.  You have trouble using the spirometer as often as instructed.  Your pain medication is not giving enough relief while using the spirometer.  You develop fever of 100.5 F (38.1 C) or higher. SEEK IMMEDIATE MEDICAL CARE IF:   You cough up bloody sputum that had not been present before.  You develop fever of 102 F (38.9 C) or greater.  You develop worsening pain at or near the incision site. MAKE SURE YOU:   Understand these instructions.  Will watch your condition.  Will get help right away if you are not doing well or get worse. Document Released: 03/23/2007 Document Revised: 02/02/2012 Document Reviewed: 05/24/2007 ExitCare Patient Information 2014 ExitCare, [place].   ________________________________________________________________________    CLEAR LIQUID DIET   Foods Allowed                                                                     Foods Excluded  Coffee and tea, regular and decaf                             liquids that you cannot  Plain Jell-O in any flavor                                             see through such as: Fruit ices (not with fruit pulp)                                     milk, soups, orange juice  Iced Popsicles                                    All solid food Carbonated beverages, regular and diet  Cranberry, grape and apple juices Sports drinks like Gatorade Lightly seasoned clear broth or consume(fat free) Sugar, honey syrup  Sample Menu Breakfast                                Lunch                                     Supper Cranberry  juice                    Beef broth                            Chicken broth Jell-O                                     Grape juice                           Apple juice Coffee or tea                        Jell-O                                      Popsicle                                                Coffee or tea                        Coffee or tea  _____________________________________________________________________

## 2014-12-22 ENCOUNTER — Other Ambulatory Visit: Payer: Self-pay | Admitting: *Deleted

## 2014-12-22 ENCOUNTER — Encounter (HOSPITAL_COMMUNITY): Payer: Self-pay

## 2014-12-22 ENCOUNTER — Telehealth: Payer: Self-pay | Admitting: *Deleted

## 2014-12-22 ENCOUNTER — Encounter: Payer: Self-pay | Admitting: Nurse Practitioner

## 2014-12-22 ENCOUNTER — Ambulatory Visit (HOSPITAL_BASED_OUTPATIENT_CLINIC_OR_DEPARTMENT_OTHER): Payer: Medicare Other | Admitting: Nurse Practitioner

## 2014-12-22 ENCOUNTER — Encounter (HOSPITAL_COMMUNITY)
Admission: RE | Admit: 2014-12-22 | Discharge: 2014-12-22 | Disposition: A | Discharge status: Home / Self Care | Payer: Medicare Other | Source: Ambulatory Visit | Attending: Gynecologic Oncology | Admitting: Gynecologic Oncology

## 2014-12-22 VITALS — BP 190/109 | HR 99 | Temp 98.4°F | Resp 18 | Ht 67.0 in | Wt 138.2 lb

## 2014-12-22 DIAGNOSIS — G893 Neoplasm related pain (acute) (chronic): Secondary | ICD-10-CM | POA: Diagnosis not present

## 2014-12-22 DIAGNOSIS — C562 Malignant neoplasm of left ovary: Secondary | ICD-10-CM | POA: Diagnosis not present

## 2014-12-22 DIAGNOSIS — I10 Essential (primary) hypertension: Secondary | ICD-10-CM | POA: Diagnosis not present

## 2014-12-22 DIAGNOSIS — C569 Malignant neoplasm of unspecified ovary: Secondary | ICD-10-CM | POA: Insufficient documentation

## 2014-12-22 DIAGNOSIS — N839 Noninflammatory disorder of ovary, fallopian tube and broad ligament, unspecified: Secondary | ICD-10-CM | POA: Diagnosis not present

## 2014-12-22 DIAGNOSIS — C182 Malignant neoplasm of ascending colon: Secondary | ICD-10-CM | POA: Diagnosis not present

## 2014-12-22 DIAGNOSIS — Z01818 Encounter for other preprocedural examination: Secondary | ICD-10-CM | POA: Insufficient documentation

## 2014-12-22 DIAGNOSIS — F419 Anxiety disorder, unspecified: Secondary | ICD-10-CM

## 2014-12-22 DIAGNOSIS — C189 Malignant neoplasm of colon, unspecified: Secondary | ICD-10-CM

## 2014-12-22 DIAGNOSIS — N838 Other noninflammatory disorders of ovary, fallopian tube and broad ligament: Secondary | ICD-10-CM

## 2014-12-22 HISTORY — DX: Anemia, unspecified: D64.9

## 2014-12-22 HISTORY — DX: Family history of other specified conditions: Z84.89

## 2014-12-22 HISTORY — DX: Headache: R51

## 2014-12-22 HISTORY — DX: Malignant (primary) neoplasm, unspecified: C80.1

## 2014-12-22 HISTORY — DX: Headache, unspecified: R51.9

## 2014-12-22 HISTORY — DX: Personal history of other medical treatment: Z92.89

## 2014-12-22 LAB — COMPREHENSIVE METABOLIC PANEL
ALT: 16 U/L (ref 0–35)
AST: 22 U/L (ref 0–37)
Albumin: 4.3 g/dL (ref 3.5–5.2)
Alkaline Phosphatase: 79 U/L (ref 39–117)
Anion gap: 6 (ref 5–15)
BUN: 16 mg/dL (ref 6–23)
CALCIUM: 9.4 mg/dL (ref 8.4–10.5)
CHLORIDE: 110 mmol/L (ref 96–112)
CO2: 26 mmol/L (ref 19–32)
CREATININE: 0.65 mg/dL (ref 0.50–1.10)
GFR calc Af Amer: >90 mL/min (ref >90)
Glucose, Bld: 110 mg/dL — ABNORMAL HIGH (ref 70–99)
POTASSIUM: 4 mmol/L (ref 3.5–5.1)
SODIUM: 142 mmol/L (ref 135–145)
TOTAL PROTEIN: 7.4 g/dL (ref 6.0–8.3)
Total Bilirubin: 0.6 mg/dL (ref 0.3–1.2)

## 2014-12-22 LAB — URINALYSIS, ROUTINE W REFLEX MICROSCOPIC
BILIRUBIN URINE: NEGATIVE
Glucose, UA: NEGATIVE mg/dL
Hgb urine dipstick: NEGATIVE
Ketones, ur: NEGATIVE mg/dL
LEUKOCYTES UA: NEGATIVE
Nitrite: NEGATIVE
PH: 6 (ref 5.0–8.0)
Protein, ur: NEGATIVE mg/dL
SPECIFIC GRAVITY, URINE: 1.011 (ref 1.005–1.030)
UROBILINOGEN UA: 0.2 mg/dL (ref 0.0–1.0)

## 2014-12-22 LAB — CBC WITH DIFFERENTIAL/PLATELET
BASOS ABS: 0 10*3/uL (ref 0.0–0.1)
BASOS PCT: 0 % (ref 0–1)
EOS ABS: 0 10*3/uL (ref 0.0–0.7)
EOS PCT: 1 % (ref 0–5)
HEMATOCRIT: 33.8 % — AB (ref 36.0–46.0)
HEMOGLOBIN: 11.7 g/dL — AB (ref 12.0–15.0)
Lymphocytes Relative: 42 % (ref 12–46)
Lymphs Abs: 2.9 10*3/uL (ref 0.7–4.0)
MCH: 33.2 pg (ref 26.0–34.0)
MCHC: 34.6 g/dL (ref 30.0–36.0)
MCV: 96 fL (ref 78.0–100.0)
MONOS PCT: 6 % (ref 3–12)
Monocytes Absolute: 0.4 10*3/uL (ref 0.1–1.0)
Neutro Abs: 3.6 10*3/uL (ref 1.7–7.7)
Neutrophils Relative %: 51 % (ref 43–77)
Platelets: 206 10*3/uL (ref 150–400)
RBC: 3.52 MIL/uL — AB (ref 3.87–5.11)
RDW: 14 % (ref 11.5–15.5)
WBC: 7 10*3/uL (ref 4.0–10.5)

## 2014-12-22 LAB — ABO/RH: ABO/RH(D): B POS

## 2014-12-22 MED ORDER — OXYCODONE HCL 5 MG PO TABS: ORAL_TABLET | ORAL | Status: DC

## 2014-12-22 NOTE — Assessment & Plan Note (Signed)
Patient is scheduled for right ovary and other abdominal surgery this coming Tuesday 12/26/2014 per Dr. Denman George.  Patient underwent her preop visit earlier today.  Patient is scheduled to return to the [facility] for labs and a follow-up visit on 01/03/2015.

## 2014-12-22 NOTE — Telephone Encounter (Signed)
Received call from yest that pt had pain & med not helping.  Returned call to pt & she reports that she is having abdominal/pelvic pain, "even navel hurts".  She reports taking 1-3 oxycodone with minimal improvement in pain.  She is trying to exercise some, stating that she has to move around & just can't sit all the time & the pain is limiting her.  She has also tried motrin & vomited it this am but states it doesn't help either. She has an appt at [facility] for pre-op labs today @ 1pm. Note to Dr Burr Medico.

## 2014-12-22 NOTE — Assessment & Plan Note (Signed)
Patient is status post right chemotherapy colectomy and terminal ileectomy.  Patient will most likely require adjuvant chemotherapy for both colon cancer and ovarian cancer when she has recovered from surgery.  Also, patient is scheduled to have a port placed during her surgery this coming Tuesday  12/26/2014.

## 2014-12-22 NOTE — Assessment & Plan Note (Signed)
Patient does appear fairly anxious on exam today.  She was quite tearful during the exam.  Patient states that she has already met with the [place] worker; and she has been advised to follow-up with a local psychiatrist as well.  Patient states that she would prefer to see a psychiatrist what she has recovered from surgery.

## 2014-12-22 NOTE — Progress Notes (Signed)
EKG- 11/01/14 along with CXR 11/01/14 in [facility].

## 2014-12-22 NOTE — Telephone Encounter (Signed)
-----   Message from Patton Salles, RN sent at 12/22/2014  9:49 AM EST ----- Pt left a voice mail on Triage stating her pain med is not working. Requested call back. [phone]  Thanks,Tammi

## 2014-12-22 NOTE — Assessment & Plan Note (Deleted)
Patient is scheduled for right ovary and other abdominal surgery this coming Tuesday 12/26/2014 per Dr. Denman George.  Patient underwent her preop visit earlier today.  Patient is scheduled to return to the [facility] for labs and a follow-up visit on 01/03/2015.

## 2014-12-22 NOTE — Assessment & Plan Note (Signed)
Blood pressure elevated to 182/105 while at the cancer Center today.  Patient states that she has been taking her blood pressure medication as previously directed.  Patient states that her hypertension today is secondary to her uncontrolled pain.  Advised patient to recheck her blood pressure when she gets back home; let us know if it remains elevated.

## 2014-12-22 NOTE — Assessment & Plan Note (Addendum)
Patient does continue to complain of chronic, significant abdominal discomfort.  Patient complains of only intermittent nausea and occasional vomiting.  Patient states she has been up to both eat and drink.  Patient denies any diarrhea or constipation.  She denies any recent fever or chills.  On exam-general abdomen is slightly tender with palpation; but no rebound tenderness or ascites noted.  Patient denies any UTI symptoms whatsoever.  Patient has been taking only one oxycodone every few hours for pain control.  She states that the oxycodone has become fairly ineffective at this dose.  Patient is scheduled for removal of right ovary and other abdominal surgery this coming Tuesday, 12/26/2014 per Dr. Denman George.  Hopefully, patient's chronic abdominal discomfort will resolve following surgical intervention.  Patient was advised to try the oxycodone 2 tablets every 4 hours to see if this helps.  Patient was given a prescription refill for the oxycodone today.

## 2014-12-22 NOTE — Progress Notes (Signed)
SYMPTOM MANAGEMENT CLINIC   HPI: Catherine Rogers 54 y.o. female diagnosed with both colon cancer and ovarian cancer.  Patient is status post right hemicolectomy and terminal ileectomy.  She has also had her left ovary removed as well.  Patient is scheduled for removal of right ovary and other abdominal surgery this coming Tuesday, 12/26/2014.  Will most likely undergo chemotherapy once she has recovered from surgery.   HPI  ROS  Past Medical History  Diagnosis Date  . Hypertension   . Ectopic pregnancy   . Head injury, closed, with brief LOC     had a cut- as a child  . Asthma   . Anxiety   . Depression   . PTSD (post-traumatic stress disorder)   . GERD (gastroesophageal reflux disease)   . Arthritis   . Pelvic cyst   . Complication of anesthesia     woke and saw long tube coming out of mouth . sat up then went back to sleep  . PONV (postoperative nausea and vomiting)   . Family history of adverse reaction to anesthesia     sister on dialysis- hypotension   . Headache   . Cancer   . Anemia   . History of blood transfusion     2005 approximately     Past Surgical History  Procedure Laterality Date  . Esophagogastroduodenoscopy N/A 09/17/2014    Procedure: ESOPHAGOGASTRODUODENOSCOPY (EGD);  Surgeon: Jeryl Columbia, MD;  Location[facility] END[facility]COPY;  Service: Endoscopy;  Laterality: N/A;  . Abdominal hysterectomy    . Tubal ligation    . Partial colectomy Right 09/18/2014    Procedure: RIGHT HEMI COLECTOMY;  Surgeon: Doreen Salvage, MD;  Location: [facility];  Service: General;  Laterality: Right;  . Ileostomy Right 09/18/2014    Procedure: ILEOCOLOSTOMY;  Surgeon: Doreen Salvage, MD;  Location: [place];  Service: General;  Laterality: Right;  . Laparotomy N/A 11/01/2014    Procedure: EXPLORATORY LAPAROTOMY;  Surgeon: Doreen Salvage, MD;  Location: [facility];  Service: General;  Laterality: N/A;  . Mass excision  11/01/2014    Procedure: EXCISION PELVIC MASS;  Surgeon: Doreen Salvage, MD;  Location: [facility];   Service: General;;    has GI bleed; Alcohol use; Intussusception, ileocecal; Mass of pelvis; Adenocarcinoma of colon; 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; Hypertension; Anxiety; and Cancer associated pain on her problem list.    is allergic to other; demerol; morphine and related; and penicillins.    Medication List       This list is accurate as of: 12/22/14  4:44 PM.  Always use your most recent med list.               DSS 100 MG Caps  Take 100 mg by mouth 2 (two) times daily as needed for mild constipation.     hydrochlorothiazide 25 MG tablet  Commonly known as:  HYDRODIURIL  Take 25 mg by mouth every morning.     ibuprofen 800 MG tablet  Commonly known as:  ADVIL,MOTRIN  Take 800 mg by mouth every 8 (eight) hours as needed for mild pain.     methocarbamol 500 MG tablet  Commonly known as:  ROBAXIN  Take 500 mg by mouth every 8 (eight) hours as needed for muscle spasms.     mirtazapine 15 MG tablet  Commonly known as:  REMERON  Take 1 tablet (15 mg total) by mouth at bedtime.     ondansetron 8 MG tablet  Commonly known as:  ZOFRAN  Take 1 tablet (8 mg total) by mouth every 8 (eight) hours as needed for nausea or vomiting.     oxyCODONE 5 MG immediate release tablet  Commonly known as:  Oxy IR/ROXICODONE  1-2 tabs PO Q 4 hours PRN pain.     ranitidine 150 MG tablet  Commonly known as:  ZANTAC  Take 150 mg by mouth 2 (two) times daily as needed for heartburn.         PHYSICAL EXAMINATION  Blood pressure 190/109, pulse 99, temperature 98.4 F (36.9 C), temperature source Oral, resp. rate 18, height 5' 7"  (1.702 m), weight 138 lb 3.2 oz (62.687 kg), SpO2 100 %.  Physical Exam  Constitutional: She is oriented to person, place, and time and well-developed, well-nourished, and in no distress.  HENT:  Head: Normocephalic and atraumatic.  Mouth/Throat: Oropharynx is clear and moist.  Eyes: Conjunctivae and EOM are normal. Pupils are equal, round, and reactive to light. Right  eye exhibits no discharge. Left eye exhibits no discharge. No scleral icterus.  Neck: Normal range of motion. Neck supple. No JVD present. No tracheal deviation present. No thyromegaly present.  Cardiovascular: Normal rate, regular rhythm, normal heart sounds and intact distal pulses.   Pulmonary/Chest: Effort normal and breath sounds normal. No respiratory distress. She has no wheezes. She has no rales. She exhibits no tenderness.  Abdominal: Soft. Bowel sounds are normal. She exhibits no distension and no mass. There is tenderness. There is no rebound and no guarding.  Musculoskeletal: Normal range of motion. She exhibits no edema or tenderness.  Lymphadenopathy:    She has no cervical adenopathy.  Neurological: She is alert and oriented to person, place, and time. Gait normal.  Skin: Skin is warm and dry. No rash noted. No erythema.  Psychiatric:  Patient fairly tearful and anxious on exam today.  Nursing note and vitals reviewed.   LABORATORY DATA:. Hospital Outpatient Visit on 12/22/2014  Component Date Value Ref Range Status  . WBC 12/22/2014 7.0  4.0 - 10.5 K/uL Final  . RBC 12/22/2014 3.52* 3.87 - 5.11 MIL/uL Final  . Hemoglobin 12/22/2014 11.7* 12.0 - 15.0 g/dL Final  . HCT 12/22/2014 33.8* 36.0 - 46.0 % Final  . MCV 12/22/2014 96.0  78.0 - 100.0 fL Final  . MCH 12/22/2014 33.2  26.0 - 34.0 pg Final  . MCHC 12/22/2014 34.6  30.0 - 36.0 g/dL Final  . RDW 12/22/2014 14.0  11.5 - 15.5 % Final  . Platelets 12/22/2014 206  150 - 400 K/uL Final  . Neutrophils Relative % 12/22/2014 51  43 - 77 % Final  . Neutro Abs 12/22/2014 3.6  1.7 - 7.7 K/uL Final  . Lymphocytes Relative 12/22/2014 42  12 - 46 % Final  . Lymphs Abs 12/22/2014 2.9  0.7 - 4.0 K/uL Final  . Monocytes Relative 12/22/2014 6  3 - 12 % Final  . Monocytes Absolute 12/22/2014 0.4  0.1 - 1.0 K/uL Final  . Eosinophils Relative 12/22/2014 1  0 - 5 % Final  . Eosinophils Absolute 12/22/2014 0.0  0.0 - 0.7 K/uL Final  .  Basophils Relative 12/22/2014 0  0 - 1 % Final  . Basophils Absolute 12/22/2014 0.0  0.0 - 0.1 K/uL Final  . Sodium 12/22/2014 142  135 - 145 mmol/L Final  . Potassium 12/22/2014 4.0  3.5 - 5.1 mmol/L Final  . Chloride 12/22/2014 110  96 - 112 mmol/L Final  . CO2 12/22/2014 26  19 - 32 mmol/L Final  .  Glucose, Bld 12/22/2014 110* 70 - 99 mg/dL Final  . BUN 12/22/2014 16  6 - 23 mg/dL Final  . Creatinine, Ser 12/22/2014 0.65  0.50 - 1.10 mg/dL Final  . Calcium 12/22/2014 9.4  8.4 - 10.5 mg/dL Final  . Total Protein 12/22/2014 7.4  6.0 - 8.3 g/dL Final  . Albumin 12/22/2014 4.3  3.5 - 5.2 g/dL Final  . AST 12/22/2014 22  0 - 37 U/L Final  . ALT 12/22/2014 16  0 - 35 U/L Final  . Alkaline Phosphatase 12/22/2014 79  39 - 117 U/L Final  . Total Bilirubin 12/22/2014 0.6  0.3 - 1.2 mg/dL Final  . GFR calc non Af Amer 12/22/2014 >90  >90 mL/min Final  . GFR calc Af Amer 12/22/2014 >90  >90 mL/min Final   Comment: (NOTE) The eGFR has been calculated using the CKD EPI equation. This calculation has not been validated in all clinical situations. eGFR's persistently <90 mL/min signify possible Chronic Kidney Disease.   . Anion gap 12/22/2014 6  5 - 15 Final  . Color, Urine 12/22/2014 YELLOW  YELLOW Final  . APPearance 12/22/2014 CLEAR  CLEAR Final  . Specific Gravity, Urine 12/22/2014 1.011  1.005 - 1.030 Final  . pH 12/22/2014 6.0  5.0 - 8.0 Final  . Glucose, UA 12/22/2014 NEGATIVE  NEGATIVE mg/dL Final  . Hgb urine dipstick 12/22/2014 NEGATIVE  NEGATIVE Final  . Bilirubin Urine 12/22/2014 NEGATIVE  NEGATIVE Final  . Ketones, ur 12/22/2014 NEGATIVE  NEGATIVE mg/dL Final  . Protein, ur 12/22/2014 NEGATIVE  NEGATIVE mg/dL Final  . Urobilinogen, UA 12/22/2014 0.2  0.0 - 1.0 mg/dL Final  . Nitrite 12/22/2014 NEGATIVE  NEGATIVE Final  . Leukocytes, UA 12/22/2014 NEGATIVE  NEGATIVE Final   MICROSCOPIC NOT DONE ON URINES WITH NEGATIVE PROTEIN, BLOOD, LEUKOCYTES, NITRITE, OR GLUCOSE <1000 mg/dL.      RADIOGRAPHIC STUDIES: No results found.  ASSESSMENT/PLAN:    Hypertension Blood pressure elevated to 182/105 while at the cancer Center today.  Patient states that she has been taking her blood pressure medication as previously directed.  Patient states that her hypertension today is secondary to her uncontrolled pain.  Advised patient to recheck her blood pressure when she gets back home; let us know if it remains elevated.   Cancer associated pain Patient does continue to complain of chronic, significant abdominal discomfort.  Patient complains of only intermittent nausea and occasional vomiting.  Patient states she has been up to both eat and drink.  Patient denies any diarrhea or constipation.  She denies any recent fever or chills.  On exam-general abdomen is slightly tender with palpation; but no rebound tenderness or ascites noted.  Patient denies any UTI symptoms whatsoever.  Patient has been taking only one oxycodone every few hours for pain control.  She states that the oxycodone has become fairly ineffective at this dose.  Patient is scheduled for removal of right ovary and other abdominal surgery this coming Tuesday, 12/26/2014 per Dr. Denman George.  Hopefully, patient's chronic abdominal discomfort will resolve following surgical intervention.  Patient was advised to try the oxycodone 2 tablets every 4 hours to see if this helps.  Patient was given a prescription refill for the oxycodone today.   Anxiety Patient does appear fairly anxious on exam today.  She was quite tearful during the exam.  Patient states that she has already met with the [place] worker; and she has been advised to follow-up with a local psychiatrist as well.  Patient states that she would prefer to see a psychiatrist what she has recovered from surgery.   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 Patient is scheduled for right ovary and other abdominal surgery this coming Tuesday 12/26/2014 per Dr. Denman George.  Patient  underwent her preop visit earlier today.  Patient is scheduled to return to the [facility] for labs and a follow-up visit on 01/03/2015.   Adenocarcinoma of colon Patient is status post right chemotherapy colectomy and terminal ileectomy.  Patient will most likely require adjuvant chemotherapy for both colon cancer and ovarian cancer when she has recovered from surgery.  Also, patient is scheduled to have a port placed during her surgery this coming Tuesday  12/26/2014.    Patient stated understanding of all instructions; and was in agreement with this plan of care. The patient knows to call the clinic with any problems, questions or concerns.   Review/collaboration with Dr. Burr Medico regarding all aspects of patient's visit today.   Total time spent with patient was 25 minutes;  with greater than 75 percent of that time spent in face to face counseling regarding her symptoms, and coordination of care and follow up.  Disclaimer: This note was dictated with voice recognition software. Similar sounding words can inadvertently be transcribed and may not be corrected upon review.   Drue Second, NP 12/22/2014

## 2014-12-22 NOTE — Telephone Encounter (Signed)
Called pt after talking with Dr Burr Medico & per Dr Burr Medico if she wants to be seen, check with Selena Lesser NP to see if she would want to see her to evaluate pain.  Talked with pt & she would like to come in.  She states she is out of her pain med & has been taking up to three at one time & Dr Ernestina Penna instructions were to take 2 q 4 hour & pt doesn't think this will help.  Spoke with Selena Lesser & she is willing to see pt.  POF to scheduler & pt knows to come @ 2pm after preop lab @ 1pm.

## 2014-12-22 NOTE — Progress Notes (Signed)
Blood pressure 176/109 at beginning of preop appointment.  Patient Stated " This is better than it has been I have been in so much pain.  Rechecked blood pressure at end of preop appointment - 172/96.  FYI.

## 2014-12-25 ENCOUNTER — Telehealth: Payer: Self-pay | Admitting: Nurse Practitioner

## 2014-12-25 NOTE — Telephone Encounter (Addendum)
Left message to return call to remind patient of upcoming surgery 12/26/14 and ensure she has been on clear liquid diet today, 12/25/14, and will be NPO starting at midnight. Waiting for call back.

## 2014-12-26 ENCOUNTER — Encounter (HOSPITAL_COMMUNITY): Payer: Self-pay | Admitting: *Deleted

## 2014-12-26 ENCOUNTER — Encounter (HOSPITAL_COMMUNITY)
Admission: RE | Disposition: A | Discharge status: Home Health | Payer: Self-pay | Source: Ambulatory Visit | Attending: Obstetrics & Gynecology

## 2014-12-26 ENCOUNTER — Inpatient Hospital Stay (HOSPITAL_COMMUNITY): Payer: Medicare Other | Admitting: *Deleted

## 2014-12-26 ENCOUNTER — Inpatient Hospital Stay (HOSPITAL_COMMUNITY)
Admission: RE | Admit: 2014-12-26 | Discharge: 2014-12-29 | DRG: 737 | Disposition: A | Discharge status: Home Health | Payer: Medicare Other | Source: Ambulatory Visit | Attending: Obstetrics & Gynecology | Admitting: Obstetrics & Gynecology

## 2014-12-26 DIAGNOSIS — N7093 Salpingitis and oophoritis, unspecified: Secondary | ICD-10-CM | POA: Diagnosis not present

## 2014-12-26 DIAGNOSIS — C561 Malignant neoplasm of right ovary: Secondary | ICD-10-CM | POA: Diagnosis present

## 2014-12-26 DIAGNOSIS — K219 Gastro-esophageal reflux disease without esophagitis: Secondary | ICD-10-CM | POA: Diagnosis present

## 2014-12-26 DIAGNOSIS — Z801 Family history of malignant neoplasm of trachea, bronchus and lung: Secondary | ICD-10-CM | POA: Diagnosis not present

## 2014-12-26 DIAGNOSIS — Z808 Family history of malignant neoplasm of other organs or systems: Secondary | ICD-10-CM

## 2014-12-26 DIAGNOSIS — M79609 Pain in unspecified limb: Secondary | ICD-10-CM | POA: Diagnosis not present

## 2014-12-26 DIAGNOSIS — F419 Anxiety disorder, unspecified: Secondary | ICD-10-CM | POA: Diagnosis present

## 2014-12-26 DIAGNOSIS — N3289 Other specified disorders of bladder: Secondary | ICD-10-CM | POA: Diagnosis not present

## 2014-12-26 DIAGNOSIS — Z01812 Encounter for preprocedural laboratory examination: Secondary | ICD-10-CM | POA: Diagnosis not present

## 2014-12-26 DIAGNOSIS — M1990 Unspecified osteoarthritis, unspecified site: Secondary | ICD-10-CM | POA: Diagnosis not present

## 2014-12-26 DIAGNOSIS — R29898 Other symptoms and signs involving the musculoskeletal system: Secondary | ICD-10-CM

## 2014-12-26 DIAGNOSIS — R200 Anesthesia of skin: Secondary | ICD-10-CM | POA: Diagnosis present

## 2014-12-26 DIAGNOSIS — I10 Essential (primary) hypertension: Secondary | ICD-10-CM | POA: Diagnosis present

## 2014-12-26 DIAGNOSIS — Z79899 Other long term (current) drug therapy: Secondary | ICD-10-CM

## 2014-12-26 DIAGNOSIS — R208 Other disturbances of skin sensation: Secondary | ICD-10-CM | POA: Diagnosis not present

## 2014-12-26 DIAGNOSIS — M47814 Spondylosis without myelopathy or radiculopathy, thoracic region: Secondary | ICD-10-CM | POA: Diagnosis not present

## 2014-12-26 DIAGNOSIS — R531 Weakness: Secondary | ICD-10-CM | POA: Diagnosis present

## 2014-12-26 DIAGNOSIS — D62 Acute posthemorrhagic anemia: Secondary | ICD-10-CM | POA: Diagnosis not present

## 2014-12-26 DIAGNOSIS — M4806 Spinal stenosis, lumbar region: Secondary | ICD-10-CM | POA: Diagnosis not present

## 2014-12-26 DIAGNOSIS — Z9049 Acquired absence of other specified parts of digestive tract: Secondary | ICD-10-CM | POA: Diagnosis present

## 2014-12-26 DIAGNOSIS — M5136 Other intervertebral disc degeneration, lumbar region: Secondary | ICD-10-CM | POA: Diagnosis not present

## 2014-12-26 DIAGNOSIS — F17210 Nicotine dependence, cigarettes, uncomplicated: Secondary | ICD-10-CM | POA: Diagnosis present

## 2014-12-26 DIAGNOSIS — Z1509 Genetic susceptibility to other malignant neoplasm: Secondary | ICD-10-CM

## 2014-12-26 DIAGNOSIS — N9489 Other specified conditions associated with female genital organs and menstrual cycle: Secondary | ICD-10-CM | POA: Diagnosis not present

## 2014-12-26 DIAGNOSIS — C569 Malignant neoplasm of unspecified ovary: Secondary | ICD-10-CM

## 2014-12-26 DIAGNOSIS — C189 Malignant neoplasm of colon, unspecified: Secondary | ICD-10-CM | POA: Diagnosis present

## 2014-12-26 DIAGNOSIS — M47896 Other spondylosis, lumbar region: Secondary | ICD-10-CM | POA: Diagnosis not present

## 2014-12-26 DIAGNOSIS — K668 Other specified disorders of peritoneum: Secondary | ICD-10-CM | POA: Diagnosis not present

## 2014-12-26 DIAGNOSIS — Z85038 Personal history of other malignant neoplasm of large intestine: Secondary | ICD-10-CM | POA: Diagnosis not present

## 2014-12-26 DIAGNOSIS — Z9889 Other specified postprocedural states: Secondary | ICD-10-CM | POA: Diagnosis not present

## 2014-12-26 DIAGNOSIS — M79605 Pain in left leg: Secondary | ICD-10-CM

## 2014-12-26 DIAGNOSIS — C562 Malignant neoplasm of left ovary: Secondary | ICD-10-CM | POA: Diagnosis present

## 2014-12-26 DIAGNOSIS — Z8543 Personal history of malignant neoplasm of ovary: Secondary | ICD-10-CM | POA: Diagnosis not present

## 2014-12-26 DIAGNOSIS — Z90710 Acquired absence of both cervix and uterus: Secondary | ICD-10-CM | POA: Diagnosis not present

## 2014-12-26 HISTORY — PX: ROBOTIC ASSISTED TOTAL HYSTERECTOMY WITH BILATERAL SALPINGO OOPHERECTOMY: SHX6086

## 2014-12-26 HISTORY — PX: LAPAROTOMY: SHX154

## 2014-12-26 SURGERY — ROBOTIC ASSISTED TOTAL HYSTERECTOMY WITH BILATERAL SALPINGO OOPHORECTOMY
Anesthesia: General | Laterality: Right

## 2014-12-26 MED ORDER — FENTANYL CITRATE 0.05 MG/ML IJ SOLN
INTRAMUSCULAR | Status: AC
  Filled 2014-12-26: qty 5

## 2014-12-26 MED ORDER — PROPOFOL 10 MG/ML IV BOLUS
INTRAVENOUS | Status: AC
  Filled 2014-12-26: qty 20

## 2014-12-26 MED ORDER — IBUPROFEN 800 MG PO TABS: 800.0000 mg | ORAL_TABLET | Freq: Four times a day (QID) | ORAL | Status: DC

## 2014-12-26 MED ORDER — LACTATED RINGERS IV SOLN
INTRAVENOUS | Status: DC
  Administered 2014-12-26: 11:00:00 via INTRAVENOUS
  Administered 2014-12-26: 1000 mL via INTRAVENOUS
  Administered 2014-12-26: 13:00:00 via INTRAVENOUS

## 2014-12-26 MED ORDER — MIDAZOLAM HCL 2 MG/2ML IJ SOLN
INTRAMUSCULAR | Status: AC
  Filled 2014-12-26: qty 2

## 2014-12-26 MED ORDER — ONDANSETRON HCL 4 MG/2ML IJ SOLN
INTRAMUSCULAR | Status: DC | PRN
  Administered 2014-12-26: 4 mg via INTRAVENOUS

## 2014-12-26 MED ORDER — CIPROFLOXACIN IN D5W 400 MG/200ML IV SOLN
INTRAVENOUS | Status: AC
  Filled 2014-12-26: qty 200

## 2014-12-26 MED ORDER — LACTATED RINGERS IV SOLN: INTRAVENOUS | Status: DC

## 2014-12-26 MED ORDER — ACETAMINOPHEN 500 MG PO TABS
1000.0000 mg | ORAL_TABLET | Freq: Four times a day (QID) | ORAL | Status: DC
  Filled 2014-12-26 (×5): qty 2

## 2014-12-26 MED ORDER — METOCLOPRAMIDE HCL 5 MG/ML IJ SOLN
INTRAMUSCULAR | Status: DC | PRN
  Administered 2014-12-26: 10 mg via INTRAVENOUS

## 2014-12-26 MED ORDER — PHENYLEPHRINE HCL 10 MG/ML IJ SOLN
INTRAMUSCULAR | Status: DC | PRN
  Administered 2014-12-26 (×2): 40 ug via INTRAVENOUS
  Administered 2014-12-26 (×2): 80 ug via INTRAVENOUS
  Administered 2014-12-26: 40 ug via INTRAVENOUS
  Administered 2014-12-26: 80 ug via INTRAVENOUS

## 2014-12-26 MED ORDER — CLINDAMYCIN PHOSPHATE 900 MG/50ML IV SOLN
INTRAVENOUS | Status: AC
  Filled 2014-12-26: qty 50

## 2014-12-26 MED ORDER — EPHEDRINE SULFATE 50 MG/ML IJ SOLN
INTRAMUSCULAR | Status: AC
  Filled 2014-12-26: qty 1

## 2014-12-26 MED ORDER — HYDRALAZINE HCL 20 MG/ML IJ SOLN
2.5000 mg | Freq: Once | INTRAMUSCULAR | Status: AC
  Administered 2014-12-26: 2.5 mg via INTRAVENOUS

## 2014-12-26 MED ORDER — FENTANYL CITRATE 0.05 MG/ML IJ SOLN
INTRAMUSCULAR | Status: DC | PRN
  Administered 2014-12-26: 150 ug via INTRAVENOUS
  Administered 2014-12-26 (×2): 50 ug via INTRAVENOUS

## 2014-12-26 MED ORDER — BUPIVACAINE LIPOSOME 1.3 % IJ SUSP
20.0000 mL | Freq: Once | INTRAMUSCULAR | Status: AC
  Administered 2014-12-26: 20 mL
  Filled 2014-12-26: qty 20

## 2014-12-26 MED ORDER — CLINDAMYCIN PHOSPHATE 900 MG/50ML IV SOLN
900.0000 mg | INTRAVENOUS | Status: AC
  Administered 2014-12-26: 900 mg via INTRAVENOUS

## 2014-12-26 MED ORDER — OXYCODONE HCL 5 MG PO TABS
5.0000 mg | ORAL_TABLET | ORAL | Status: DC | PRN
  Administered 2014-12-26: 5 mg via ORAL
  Filled 2014-12-26: qty 1

## 2014-12-26 MED ORDER — ONDANSETRON HCL 4 MG/2ML IJ SOLN: 4.0000 mg | Freq: Four times a day (QID) | INTRAMUSCULAR | Status: DC | PRN

## 2014-12-26 MED ORDER — CIPROFLOXACIN IN D5W 400 MG/200ML IV SOLN
400.0000 mg | INTRAVENOUS | Status: AC
  Administered 2014-12-26: 400 mg via INTRAVENOUS

## 2014-12-26 MED ORDER — ONDANSETRON HCL 4 MG PO TABS: 4.0000 mg | ORAL_TABLET | Freq: Four times a day (QID) | ORAL | Status: DC | PRN

## 2014-12-26 MED ORDER — KETAMINE HCL 10 MG/ML IJ SOLN
INTRAMUSCULAR | Status: AC
  Filled 2014-12-26: qty 1

## 2014-12-26 MED ORDER — HYDROMORPHONE HCL 2 MG/ML IJ SOLN
INTRAMUSCULAR | Status: AC
  Filled 2014-12-26: qty 1

## 2014-12-26 MED ORDER — MIRTAZAPINE 15 MG PO TABS
15.0000 mg | ORAL_TABLET | Freq: Every day | ORAL | Status: DC
  Administered 2014-12-26 – 2014-12-28 (×3): 15 mg via ORAL
  Filled 2014-12-26 (×4): qty 1

## 2014-12-26 MED ORDER — KETOROLAC TROMETHAMINE 30 MG/ML IJ SOLN
30.0000 mg | Freq: Four times a day (QID) | INTRAMUSCULAR | Status: DC
  Administered 2014-12-26: 30 mg via INTRAVENOUS

## 2014-12-26 MED ORDER — KETOROLAC TROMETHAMINE 30 MG/ML IJ SOLN
30.0000 mg | Freq: Four times a day (QID) | INTRAMUSCULAR | Status: AC
  Administered 2014-12-26 – 2014-12-27 (×3): 30 mg via INTRAVENOUS
  Filled 2014-12-26 (×3): qty 1

## 2014-12-26 MED ORDER — DEXAMETHASONE SODIUM PHOSPHATE 10 MG/ML IJ SOLN
INTRAMUSCULAR | Status: AC
  Filled 2014-12-26: qty 1

## 2014-12-26 MED ORDER — GLYCOPYRROLATE 0.2 MG/ML IJ SOLN
INTRAMUSCULAR | Status: DC | PRN
  Administered 2014-12-26: .5 mg via INTRAVENOUS

## 2014-12-26 MED ORDER — HYDRALAZINE HCL 20 MG/ML IJ SOLN
2.5000 mg | Freq: Four times a day (QID) | INTRAMUSCULAR | Status: AC | PRN
  Administered 2014-12-26: 2.5 mg via INTRAVENOUS

## 2014-12-26 MED ORDER — KETOROLAC TROMETHAMINE 30 MG/ML IJ SOLN
INTRAMUSCULAR | Status: AC
  Filled 2014-12-26: qty 1

## 2014-12-26 MED ORDER — METOCLOPRAMIDE HCL 5 MG/ML IJ SOLN
INTRAMUSCULAR | Status: AC
  Filled 2014-12-26: qty 2

## 2014-12-26 MED ORDER — ONDANSETRON HCL 4 MG/2ML IJ SOLN
INTRAMUSCULAR | Status: AC
  Filled 2014-12-26: qty 2

## 2014-12-26 MED ORDER — MIDAZOLAM HCL 5 MG/5ML IJ SOLN
INTRAMUSCULAR | Status: DC | PRN
  Administered 2014-12-26 (×2): 1 mg via INTRAVENOUS

## 2014-12-26 MED ORDER — IBUPROFEN 800 MG PO TABS
800.0000 mg | ORAL_TABLET | Freq: Four times a day (QID) | ORAL | Status: DC
  Filled 2014-12-26 (×13): qty 1

## 2014-12-26 MED ORDER — HYDROMORPHONE HCL 1 MG/ML IJ SOLN
INTRAMUSCULAR | Status: DC | PRN
  Administered 2014-12-26: 1 mg via INTRAVENOUS

## 2014-12-26 MED ORDER — PHENYLEPHRINE 40 MCG/ML (10ML) SYRINGE FOR IV PUSH (FOR BLOOD PRESSURE SUPPORT)
PREFILLED_SYRINGE | INTRAVENOUS | Status: AC
  Filled 2014-12-26: qty 10

## 2014-12-26 MED ORDER — HYDROMORPHONE HCL 1 MG/ML IJ SOLN
0.5000 mg | INTRAMUSCULAR | Status: AC | PRN
  Administered 2014-12-26 (×2): 0.5 mg via INTRAVENOUS
  Filled 2014-12-26 (×2): qty 1

## 2014-12-26 MED ORDER — GLYCOPYRROLATE 0.2 MG/ML IJ SOLN
INTRAMUSCULAR | Status: AC
  Filled 2014-12-26: qty 3

## 2014-12-26 MED ORDER — METHOCARBAMOL 500 MG PO TABS
500.0000 mg | ORAL_TABLET | Freq: Three times a day (TID) | ORAL | Status: DC | PRN
  Administered 2014-12-26: 500 mg via ORAL
  Filled 2014-12-26: qty 1

## 2014-12-26 MED ORDER — NEOSTIGMINE METHYLSULFATE 10 MG/10ML IV SOLN
INTRAVENOUS | Status: AC
  Filled 2014-12-26: qty 1

## 2014-12-26 MED ORDER — ENSURE COMPLETE PO LIQD
237.0000 mL | Freq: Two times a day (BID) | ORAL | Status: DC
  Administered 2014-12-27 – 2014-12-29 (×5): 237 mL via ORAL

## 2014-12-26 MED ORDER — 0.9 % SODIUM CHLORIDE (POUR BTL) OPTIME
TOPICAL | Status: DC | PRN
  Administered 2014-12-26: 2000 mL

## 2014-12-26 MED ORDER — FAMOTIDINE 20 MG PO TABS
20.0000 mg | ORAL_TABLET | Freq: Two times a day (BID) | ORAL | Status: DC
  Administered 2014-12-26 – 2014-12-29 (×6): 20 mg via ORAL
  Filled 2014-12-26 (×7): qty 1

## 2014-12-26 MED ORDER — ROCURONIUM BROMIDE 100 MG/10ML IV SOLN
INTRAVENOUS | Status: DC | PRN
  Administered 2014-12-26: 10 mg via INTRAVENOUS
  Administered 2014-12-26: 50 mg via INTRAVENOUS

## 2014-12-26 MED ORDER — EPHEDRINE SULFATE 50 MG/ML IJ SOLN
INTRAMUSCULAR | Status: DC | PRN
  Administered 2014-12-26 (×2): 5 mg via INTRAVENOUS

## 2014-12-26 MED ORDER — ENOXAPARIN SODIUM 40 MG/0.4ML ~~LOC~~ SOLN
40.0000 mg | SUBCUTANEOUS | Status: DC
  Filled 2014-12-26 (×3): qty 0.4

## 2014-12-26 MED ORDER — KETAMINE HCL 10 MG/ML IJ SOLN
INTRAMUSCULAR | Status: DC | PRN
  Administered 2014-12-26 (×2): 10 mg via INTRAVENOUS
  Administered 2014-12-26: 30 mg via INTRAVENOUS

## 2014-12-26 MED ORDER — PROMETHAZINE HCL 25 MG/ML IJ SOLN: 6.2500 mg | INTRAMUSCULAR | Status: DC | PRN

## 2014-12-26 MED ORDER — FENTANYL CITRATE 0.05 MG/ML IJ SOLN
INTRAMUSCULAR | Status: AC
  Filled 2014-12-26: qty 2

## 2014-12-26 MED ORDER — DEXAMETHASONE SODIUM PHOSPHATE 4 MG/ML IJ SOLN
INTRAMUSCULAR | Status: DC | PRN
Start: 2014-12-26 — End: 2014-12-26
  Administered 2014-12-26: 10 mg via INTRAVENOUS

## 2014-12-26 MED ORDER — SODIUM CHLORIDE 0.9 % IJ SOLN
INTRAMUSCULAR | Status: AC
  Filled 2014-12-26: qty 20

## 2014-12-26 MED ORDER — HYDRALAZINE HCL 20 MG/ML IJ SOLN
INTRAMUSCULAR | Status: AC
  Filled 2014-12-26: qty 1

## 2014-12-26 MED ORDER — KCL IN DEXTROSE-NACL 20-5-0.45 MEQ/L-%-% IV SOLN
INTRAVENOUS | Status: DC
  Administered 2014-12-26: 18:00:00 via INTRAVENOUS
  Administered 2014-12-27: 1000 mL via INTRAVENOUS
  Administered 2014-12-27: 17:00:00 via INTRAVENOUS
  Filled 2014-12-26 (×4): qty 1000

## 2014-12-26 MED ORDER — FENTANYL CITRATE 0.05 MG/ML IJ SOLN
25.0000 ug | INTRAMUSCULAR | Status: DC | PRN
  Administered 2014-12-26 (×2): 50 ug via INTRAVENOUS

## 2014-12-26 MED ORDER — ENOXAPARIN SODIUM 40 MG/0.4ML ~~LOC~~ SOLN
40.0000 mg | SUBCUTANEOUS | Status: AC
  Administered 2014-12-26: 40 mg via SUBCUTANEOUS
  Filled 2014-12-26: qty 0.4

## 2014-12-26 MED ORDER — ROCURONIUM BROMIDE 100 MG/10ML IV SOLN
INTRAVENOUS | Status: AC
  Filled 2014-12-26: qty 1

## 2014-12-26 MED ORDER — PROPOFOL 10 MG/ML IV BOLUS
INTRAVENOUS | Status: DC | PRN
  Administered 2014-12-26: 150 mg via INTRAVENOUS

## 2014-12-26 SURGICAL SUPPLY — 66 items
BAG SPEC RTRVL LRG 6X4 10 (ENDOMECHANICALS)
BLADE EXTENDED COATED 6.5IN (ELECTRODE) ×1 IMPLANT
CABLE HIGH FREQUENCY MONO STRZ (ELECTRODE) ×3 IMPLANT
CHLORAPREP W/TINT 26ML (MISCELLANEOUS) ×3 IMPLANT
CLIP TI MEDIUM 6 (CLIP) ×3 IMPLANT
CLIP TI MEDIUM LARGE 6 (CLIP) ×1 IMPLANT
CLIP TI WIDE RED SMALL 6 (CLIP) ×1 IMPLANT
CORDS BIPOLAR (ELECTRODE) ×3 IMPLANT
COVER SURGICAL LIGHT HANDLE (MISCELLANEOUS) ×3 IMPLANT
COVER TIP SHEARS 8 DVNC (MISCELLANEOUS) ×2 IMPLANT
COVER TIP SHEARS 8MM DA VINCI (MISCELLANEOUS) ×1
DRAPE SHEET LG 3/4 BI-LAMINATE (DRAPES) ×6 IMPLANT
DRAPE SURG IRRIG POUCH 19X23 (DRAPES) ×3 IMPLANT
DRAPE TABLE BACK 44X90 PK DISP (DRAPES) ×6 IMPLANT
DRAPE WARM FLUID 44X44 (DRAPE) ×3 IMPLANT
DRSG TEGADERM 6X8 (GAUZE/BANDAGES/DRESSINGS) ×4 IMPLANT
DRSG TELFA 4X10 ISLAND STR (GAUZE/BANDAGES/DRESSINGS) ×1 IMPLANT
ELECT REM PT RETURN 9FT ADLT (ELECTROSURGICAL) ×3
ELECTRODE REM PT RTRN 9FT ADLT (ELECTROSURGICAL) ×2 IMPLANT
FLOSEAL 10ML (HEMOSTASIS) ×1 IMPLANT
GLOVE BIO SURGEON STRL SZ 6 (GLOVE) ×9 IMPLANT
GLOVE BIO SURGEON STRL SZ 6.5 (GLOVE) ×6 IMPLANT
GOWN STRL REUS W/ TWL LRG LVL4 (GOWN DISPOSABLE) ×6 IMPLANT
GOWN STRL REUS W/TWL LRG LVL3 (GOWN DISPOSABLE) ×9 IMPLANT
GOWN STRL REUS W/TWL LRG LVL4 (GOWN DISPOSABLE) ×9
HOLDER FOLEY CATH W/STRAP (MISCELLANEOUS) ×3 IMPLANT
KIT ACCESSORY DA VINCI DISP (KITS) ×1
KIT ACCESSORY DVNC DISP (KITS) ×2 IMPLANT
KIT BASIN OR (CUSTOM PROCEDURE TRAY) ×3 IMPLANT
LABEL STERILE EO BLANK 1X3 WHT (LABEL) ×1 IMPLANT
LIGASURE IMPACT 36 18CM CVD LR (INSTRUMENTS) ×1 IMPLANT
LIQUID BAND (GAUZE/BANDAGES/DRESSINGS) ×3 IMPLANT
MANIPULATOR UTERINE 4.5 ZUMI (MISCELLANEOUS) ×2 IMPLANT
NEEDLE HYPO 22GX1.5 SAFETY (NEEDLE) ×1 IMPLANT
OCCLUDER COLPOPNEUMO (BALLOONS) ×2 IMPLANT
PEN SKIN MARKING BROAD (MISCELLANEOUS) ×3 IMPLANT
PENCIL BUTTON HOLSTER BLD 10FT (ELECTRODE) ×1 IMPLANT
POUCH SPECIMEN RETRIEVAL 10MM (ENDOMECHANICALS) IMPLANT
SET TUBE IRRIG SUCTION NO TIP (IRRIGATION / IRRIGATOR) ×3 IMPLANT
SHEET LAVH (DRAPES) ×3 IMPLANT
SOLUTION ANTI FOG 6CC (MISCELLANEOUS) ×3 IMPLANT
SOLUTION ELECTROLUBE (MISCELLANEOUS) ×3 IMPLANT
SPONGE LAP 18X18 X RAY DECT (DISPOSABLE) ×3 IMPLANT
STAPLER VISISTAT 35W (STAPLE) ×1 IMPLANT
SUT PDS AB 1 TP1 96 (SUTURE) ×2 IMPLANT
SUT VIC AB 0 CT1 27 (SUTURE) ×3
SUT VIC AB 0 CT1 27XBRD ANTBC (SUTURE) ×2 IMPLANT
SUT VIC AB 2-0 SH 27 (SUTURE) ×3
SUT VIC AB 2-0 SH 27X BRD (SUTURE) IMPLANT
SUT VIC AB 4-0 PS2 27 (SUTURE) ×6 IMPLANT
SUT VICRYL 0 TIES 12 18 (SUTURE) ×1 IMPLANT
SUT VICRYL 0 UR6 27IN ABS (SUTURE) ×3 IMPLANT
SYR 20CC LL (SYRINGE) ×1 IMPLANT
SYR 50ML LL SCALE MARK (SYRINGE) ×3 IMPLANT
SYR BULB IRRIGATION 50ML (SYRINGE) ×1 IMPLANT
TOWEL OR 17X26 10 PK STRL BLUE (TOWEL DISPOSABLE) ×6 IMPLANT
TOWEL OR NON WOVEN STRL DISP B (DISPOSABLE) ×3 IMPLANT
TRAP SPECIMEN MUCOUS 40CC (MISCELLANEOUS) IMPLANT
TRAY FOLEY CATH 14FRSI W/METER (CATHETERS) ×3 IMPLANT
TRAY LAPAROSCOPIC (CUSTOM PROCEDURE TRAY) ×3 IMPLANT
TROCAR 12M 150ML BLUNT (TROCAR) ×3 IMPLANT
TROCAR BLADELESS OPT 5 100 (ENDOMECHANICALS) ×3 IMPLANT
TROCAR XCEL 12X100 BLDLESS (ENDOMECHANICALS) ×3 IMPLANT
TUBING INSUFFLATION 10FT LAP (TUBING) ×3 IMPLANT
WATER STERILE IRR 1500ML POUR (IV SOLUTION) ×6 IMPLANT
YANKAUER SUCT BULB TIP 10FT TU (MISCELLANEOUS) ×1 IMPLANT

## 2014-12-26 NOTE — Op Note (Signed)
Preoperative Diagnosis: 1. 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   Procedure(s) Performed: 1. Diagnostic laparoscopy, exploratory laparotomy with right salpingo-oophorectomy, pelvic and para-aortic lymph node dissection, omentectomy for ovarian cancer (CPT [phone])  Surgeon: [name], MD Assistanr Surgeon: Dr Lahoma Crocker, M.D. Assistant: (an MD assistant was necessary for tissue manipulation, retraction and positioning due to the complexity of the case and hospital policies).  Specimens: Right tube / ovary, bilateral pelvic and para-aortic lymph nodes, peritoneal biopsies, and omentum.  Estimated Blood Loss: 2101mL. Blood Replacement: none. IV Fluids: 3061mL.  Urine Output: 627OJ  Complications: None.   Operative Findings:No macroscopic residual tumor. No enlarged nodes. Normal omentum. No ascites. Dense bowel-containing adhesions to anterior abdominal wall preventing a minimally invasive approach.   Procedure: After informed consent was confirmed, the patient was taken to the operating room where general anesthesia was obtained without difficulty. She was prepped and draped in the normal sterile fashion in the dorsal lithotomy position in padded Allen stirrups with good attention paid to support of the lower back and lower extremities. Position was adjusted for appropriate support. A Foley catheter was placed to gravity. Arms were tucked by her side in military position. They were padded.  Entry into the abdomen took place in the left upper quadrant palmer's point with a 35mm incision and 39mm optiview direct entry in an atraumatic fashion. Upon insufflation into the peritoneal cavity a dense wall of bowel containing adhesions were noted adherent to the entire vertical midline preventing adequate visualization. It was felt to be safest to proceed with laparotomy for adhesiolysis and case completion to avoid inadvertent visceral  injury.  A paramedian vertical midline incision was made from the pubic symphysis to the umbilicus along the site of the previous incision. The abdominal cavity was entered sharply and without incident. The adhesions to the anterior abdominal wall were taken down sharply and with the electrosurgical device. A Bookwalter retractor was then placed. A survey of the abdomen and pelvis revealed the above findings. An infragastric omentectomy was then performed after dissecting the omentum off of the transverse colon. This was taken down with a combination of Bovie cautery and the LigaSure. Palpation of the upper abdomen, including the liver, spleen, stomach, and pancreas, was normal.    The right salpingo-oophorectomy was performed by packing the bowel into the upper abdomen with moist lap sponges and the bookwalter retractor. The right tube and ovary were grossly normal in appearance. After packing the small bowel into the upper abdomen, we began the procedure by entering the right pelvic sidewall just posterior to the round ligament. The pararectal space was developed and the retroperitoneum developed up to the level of the common iliac artery. The course of the ureter was identified with ease. The pararectal space was developed. The right IP was then skeletonized, clamped, cut and sealed and transected with the ligasure. The remaining attachments to the vaginal cuff were then taken down sharply. It was apparent that the right tube and ovary were grossly normal in appearance and were sent for permanent pathology. The entire right tube and ovary were then sent to Pathology.   A staging procedure was then performed. We further developed the left and right paravesical and pararectal spaces and performed a left-sided pelvic lymph node dissection with the following borders: proximally the bifurcation of the common iliac, distally the circumflex iliac vein, laterally the genitofemoral nerve, the medial border was the  superior vesicle artery and the deep  border was the obturator nerve. All lymphatic tissue was removed and sent to Pathology. A similar procedure was performed on the contralateral side. Right and left pelvic peritoneal and anterior and posterior pelvic peritoneal biopsies were then performed with the Bovie. The right pelvic gutter was then re-explored after adjustment of the Bookwalter and the line of Toldt incised, deviating the colon medially. A right para-aortic lymph node dissection was then performed with the following borders: distally the common iliac, medially the aorta and IVC, laterally the psoas and the proximal border was just inferior to the renal vasculature The gonadal vessels were entered and made hemostatic with clips. All lymphatic tissue was removed and sent to Pathology. We performed a similar procedure on the left. The abdomen and pelvis were then copiously irrigated and all surgical sites found to be hemostatic. Peritoneal biopsies of the pelvis abdomen and cul de sac were collected. The fascia was reapproximated with #1 looped PDS using a total of two sutures. The subcutaneous layer was then irrigated with a liter of fluid and infiltrated with longacting analgesic.  The subcutaneous layer was then reapproximated with staples and 4-0 vicryl on the LUQ port. The patient tolerated the procedure well.     Sponge, lap and needle counts were correct x 3.    The patient had sequential compression devices for VTE prophylaxis and will receive Lovenox postoperatively.   The patient was extubated and taken to the recovery room in stable condition.   [name], MD

## 2014-12-26 NOTE — Anesthesia Procedure Notes (Signed)
Procedure Name: Intubation Date/Time: 12/26/2014 11:04 AM Performed by: Lollie Sails Pre-anesthesia Checklist: Patient identified, Emergency Drugs available, Suction available, Patient being monitored and Timeout performed Patient Re-evaluated:Patient Re-evaluated prior to inductionOxygen Delivery Method: Circle system utilized Preoxygenation: Pre-oxygenation with 100% oxygen Intubation Type: IV induction Ventilation: Mask ventilation without difficulty Laryngoscope Size: Miller and 3 Grade View: Grade I Tube type: Oral Tube size: 7.0 mm Number of attempts: 1 Airway Equipment and Method: Stylet Placement Confirmation: ETT inserted through vocal cords under direct vision,  positive ETCO2 and breath sounds checked- equal and bilateral Secured at: 22 cm Tube secured with: Tape Dental Injury: Teeth and Oropharynx as per pre-operative assessment  Comments: Poor dentition noted prior to intubation - no damage incurred during procedure.

## 2014-12-26 NOTE — H&P (View-Only) (Signed)
Consult Note: Gyn-Onc  Consult was requested by Dr. Hulen Skains and Dr Burr Medico for the evaluation of Catherine Rogers 54 y.o. female with synchronous colon and ovarian cancer  CC:  Chief Complaint  Patient presents with  . Ovarian Cancer    Assessment/Plan:  [name]  is a 54 y.o.  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  She requires chemotherapy with FOLFOX for her stage III colon cancer. It is unclear what her stage ovarian cancer is however due to the high-grade nature and the clear cell component on histology we would recommend 6 cycles of carboplatin and paclitaxel chemotherapy regardless of stage. On CT scan performed in November 2015 there was no gross evidence of extraovarian disease, no bulky disease that would necessitate debulking. Therefore the only real benefit to a surgical staging that I see would be to delineate which malignancy has the more advanced stage. If her ovarian cancer is truly a stage I then adjuvant chemotherapy showed focus on her more advanced colon cancer preferentially. She will still derives some benefit from the platinum in this regimen. Alternatively if a more advanced ovarian cancer is identified and staging we will need to tailor her chemotherapy to target both malignancies optimally.  I discussed this with the patient and the relatively limited benefit of staging (only being to help guide chemotherapy choices) an alternative would be to proceed with FOLFOX chemotherapy now, restage with a CT scan after treatment, and performed an oophorectomy of the right side after chemotherapy. However the patient desires the most informative approach with surgery upfront. We have scheduled her for a robotic assisted RSO, omentectomy and lymphadenectomy for 12/26/14.    I discussed that surgical risks are higher when she's had multiple surgeries back to back. I discussed that the major risks include  bleeding, infection, damage to  internal organs (such as bladder,ureters, bowels), blood clot, reoperation and rehospitalization. I discussed that she may be a good candidate for a minimally invasive approach with robotic assisted right stopping oophorectomy, omentectomy, lymphadenectomy. I discussed that she is at high risk for requiring conversion to laparotomy due to her recent prior surgeries and the potential for adhesive disease.   I agree with Dr. Ernestina Penna plan to pursue genetics testing as this patient is at high risk for carrying limb syndrome.   HPI: Catherine Rogers  is a 54 year old para 7 who is seen in consultation at the request of Dr. Burr Medico and Dr. 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 The patient has a history of abdominal pain for several months. This culminated in symptoms of a bowel obstruction in October 2015 for which she underwent imaging with a CT of the abdomen and pelvis on 09/16/2014. This revealed a 3.8 cm cecal mass with intussusception, and a 9.1 cm left ovarian mass. On 09/18/2014 she underwent an expiratory laparotomy, right hemicolectomy terminal ileoectomy with Dr. Hulen Skains. 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 Postoperatively she underwent additional staging imaging with a CT scan on 10/09/2014 this demonstrated a 9.1 cm solid cystic mass in the left pelvis. On 11/01/2014 she underwent expiratory laparotomy and left salpingo-oophorectomy which was positive for mixed endometrioid and clear cell carcinoma, FIGO grade 3, ovarian primary. Dr. Hulen Skains reports that there was no other frank evidence of macroscopic disease during surgery.  She sought consultation with treating oncologist Dr. Burr Medico who is recommending  FOLFOX chemotherapy for his stage III colon cancer.   Interval History: She is recovering slowly from her second surgery. She is improving diet and energy. She still has some lower  abdominal pain at the incision site. Her son was diagnosed with a stroke yesterday.  Current Meds:  Outpatient Encounter Prescriptions as of 12/08/2014  Medication Sig  . docusate sodium 100 MG CAPS Take 100 mg by mouth 2 (two) times daily as needed for mild constipation.  . hydrochlorothiazide (HYDRODIURIL) 25 MG tablet Take 25 mg by mouth daily.  [name] ibuprofen (ADVIL,MOTRIN) 800 MG tablet Take 800 mg by mouth every 8 (eight) hours as needed for mild pain.   . methocarbamol (ROBAXIN) 500 MG tablet   . mirtazapine (REMERON) 15 MG tablet Take 1 tablet (15 mg total) by mouth at bedtime.  . ondansetron (ZOFRAN) 8 MG tablet Take 1 tablet (8 mg total) by mouth every 8 (eight) hours as needed for nausea or vomiting.  [name] oxyCODONE (OXY IR/ROXICODONE) 5 MG immediate release tablet Take 1 tablet (5 mg total) by mouth every 4 (four) hours as needed for severe pain.  . ranitidine (ZANTAC) 150 MG tablet Take 150 mg by mouth 2 (two) times daily as needed for heartburn.   . [DISCONTINUED] HYDROcodone-acetaminophen (NORCO/VICODIN) 5-325 MG per tablet     Allergy:  Allergies  Allergen Reactions  . Other Hives and Itching    Highly acidic foods "make me itch and break out" oranges, tomatoes  . Demerol [Meperidine] Nausea And Vomiting  . Morphine And Related Nausea And Vomiting  . Penicillins Other (See Comments)    Childhood allergy    Social Hx:   History   Social History  . Marital Status: Single    Spouse Name: N/A    Number of Children: N/A  . Years of Education: N/A   Occupational History  . Not on file.   Social History Main Topics  . Smoking status: Current Some Day Smoker -- 0.25 packs/day for 5 years    Types: Cigars  . Smokeless tobacco: Never Used     Comment: every blue moon  . Alcohol Use: No     Comment: wine cooler special occasion  . Drug Use: Yes    Special: Marijuana     Comment: smokes marijuana twice a day for arthritis  . Sexual Activity: No   Other Topics Concern   . Not on file   Social History Narrative    Past Surgical Hx:  Past Surgical History  Procedure Laterality Date  . Esophagogastroduodenoscopy N/A 09/17/2014    Procedure: ESOPHAGOGASTRODUODENOSCOPY (EGD);  Surgeon: Jeryl Columbia, MD;  Location: [facility];  Service: Endoscopy;  Laterality: N/A;  . Abdominal hysterectomy    . Tubal ligation    . Partial colectomy Right 09/18/2014    Procedure: RIGHT HEMI COLECTOMY;  Surgeon: Doreen Salvage, MD;  Location: [place];  Service: General;  Laterality: Right;  . Ileostomy Right 09/18/2014    Procedure: ILEOCOLOSTOMY;  Surgeon: Doreen Salvage, MD;  Location: [place];  Service: General;  Laterality: Right;  . Laparotomy N/A 11/01/2014    Procedure: EXPLORATORY LAPAROTOMY;  Surgeon: Doreen Salvage, MD;  Location: [place];  Service: General;  Laterality: N/A;  . Mass excision  11/01/2014    Procedure: EXCISION PELVIC MASS;  Surgeon: Doreen Salvage, MD;  Location: [place];  Service: General;;    Past Medical Hx:  Past Medical History  Diagnosis Date  . Hypertension   . Ectopic pregnancy   .  Complication of anesthesia     woke and saw long tube coming out of mouth . sat up then went back to sleep  . PONV (postoperative nausea and vomiting)   . Head injury, closed, with brief LOC     had a cut- as a child  . Asthma   . Anxiety   . Depression   . PTSD (post-traumatic stress disorder)   . GERD (gastroesophageal reflux disease)   . Arthritis   . Pelvic cyst     Past Gynecological History:  7 x SVD, prior vaginal hysterectomy for fibroids.  No LMP recorded. Patient has had a hysterectomy.  Family Hx:  Family History  Problem Relation Age of Onset  . Cancer Sister 8    brain cancer   . Cancer Brother 52    lung cancer     Review of Systems:  Constitutional  Feels well,    ENT Normal appearing ears and nares bilaterally Skin/Breast  No rash, sores, jaundice, itching, dryness Cardiovascular  No chest pain, shortness of breath, or edema  Pulmonary  No  cough or wheeze.  Gastro Intestinal  No nausea, vomitting, or diarrhoea. No bright red blood per rectum, no abdominal pain, change in bowel movement, or constipation.  Genito Urinary  No frequency, urgency, dysuria, see HPI Musculo Skeletal  No myalgia, arthralgia, joint swelling or pain  Neurologic  No weakness, numbness, change in gait,  Psychology  No depression, anxiety, insomnia.   Vitals:  Blood pressure 190/110, pulse 87, temperature 98.3 F (36.8 C), temperature source Oral, resp. rate 18, height 5\' 7"  (1.702 m), weight 136 lb 4.8 oz (61.825 kg).  Physical Exam: WD in NAD Neck  Supple NROM, without any enlargements.  Lymph Node Survey No cervical supraclavicular or inguinal adenopathy Cardiovascular  Pulse normal rate, regularity and rhythm. S1 and S2 normal.  Lungs  Clear to auscultation bilateraly, without wheezes/crackles/rhonchi. Good air movement.  Skin  No rash/lesions/breakdown  Psychiatry  Alert and oriented to person, place, and time  Abdomen  Normoactive bowel sounds, abdomen soft, non-tender and thin without evidence of hernia. Midline incision healing well Back No CVA tenderness Genito Urinary  Vulva/vagina: Normal external female genitalia.  No lesions. No discharge or bleeding.  Bladder/urethra:  No lesions or masses, well supported bladder  Vagina: no lesions  Cervix: surgically absent  Uterus: surgically absent   Adnexa: no palpable masses. Rectal  Good tone, no masses no cul de sac nodularity.  Extremities  No bilateral cyanosis, clubbing or edema.   [name], MD   12/08/2014, 5:27 PM

## 2014-12-26 NOTE — Anesthesia Preprocedure Evaluation (Addendum)
Anesthesia Evaluation  Patient identified by MRN, date of birth, ID band Patient awake    Reviewed: Allergy & Precautions, H&P , NPO status , Patient's Chart, lab work & pertinent test results  History of Anesthesia Complications (+) PONV  Airway Mallampati: I  TM Distance: >3 FB Neck ROM: Full    Dental  (+) Chipped, Missing,    Pulmonary neg shortness of breath, neg sleep apnea, neg COPDneg recent URI, Current Smoker,  breath sounds clear to auscultation        Cardiovascular hypertension, Pt. on medications Rhythm:Regular     Neuro/Psych PSYCHIATRIC DISORDERS Anxiety Depression negative neurological ROS     GI/Hepatic Neg liver ROS, GERD-  Medicated and Controlled,Colon cancer s/p excision with pelvic mass   Endo/Other  negative endocrine ROS  Renal/GU negative Renal ROS     Musculoskeletal   Abdominal   Peds  Hematology negative hematology ROS (+)   Anesthesia Other Findings   Reproductive/Obstetrics                            Anesthesia Physical  Anesthesia Plan  ASA: III  Anesthesia Plan: General   Post-op Pain Management:    Induction: Intravenous  Airway Management Planned: Oral ETT  Additional Equipment: None  Intra-op Plan:   Post-operative Plan: Extubation in OR  Informed Consent: I have reviewed the patients History and Physical, chart, labs and discussed the procedure including the risks, benefits and alternatives for the proposed anesthesia with the patient or authorized representative who has indicated his/her understanding and acceptance.   Dental advisory given  Plan Discussed with: CRNA and Surgeon  Anesthesia Plan Comments:         Anesthesia Quick Evaluation

## 2014-12-26 NOTE — Transfer of Care (Signed)
Immediate Anesthesia Transfer of Care Note  Patient: Catherine Rogers  Procedure(s) Performed: Procedure(s): EXPLORATORY LAPAROSCOPY  RIGHT SALPINGO OOPHORECTOMY OMENTECTOMY LYMPHADECTOMY  (Right)  LAPAROTOMY  (N/A)  Patient Location: PACU  Anesthesia Type:General  Level of Consciousness: Patient easily awoken, sedated, comfortable, cooperative, following commands, responds to stimulation.   Airway & Oxygen Therapy: Patient spontaneously breathing, ventilating well, oxygen via simple oxygen mask.  Post-op Assessment: Report given to PACU RN, vital signs reviewed and stable, moving all extremities.   Post vital signs: Reviewed and stable.  Complications: No apparent anesthesia complications

## 2014-12-26 NOTE — Interval H&P Note (Signed)
History and Physical Interval Note:  12/26/2014 10:08 AM  Catherine Rogers  has presented today for surgery, with the diagnosis of OVARIAN CANCER   The various methods of treatment have been discussed with the patient and family. After consideration of risks, benefits and other options for treatment, the patient has consented to  Procedure(s): ROBOTIC ASSISTED RIGHT SALPINGO OOPHORECTOMY OMENTECTOMY LYMPHADECTOMY  (Right) POSSIBLE LAPAROTOMY  (N/A) as a surgical intervention .  The patient's history has been reviewed, patient examined, no change in status, stable for surgery.  I have reviewed the patient's chart and labs.  Questions were answered to the patient's satisfaction.     [name]

## 2014-12-26 NOTE — Progress Notes (Signed)
Utilization review completed.  

## 2014-12-26 NOTE — Progress Notes (Signed)
Pt complaining of leg pain. Assisted pt to dangle and sit on side of bed. Pt still complaining of increasing pain to her leg, stating that she has no feeling in her left leg and is unable to move it. I gave the pt pain medicine after she was assisted back to the bed. Pt then called out after receiving the pain medicine stating that she was in excruciating pain, pt was sweating, and stated that the pain was then moving up her thigh. Rapid response nurse was called. Once she arrived pt was still complaining of pain. I then gave the pt a dose of dilaudid to which she stated that her pain was starting to come down some. After some time the pt began complaining that the pain was coming back. MD on call was notified. MD recommended to give the pt her PRN robaxin. Will continue to monitor.

## 2014-12-27 ENCOUNTER — Encounter (HOSPITAL_COMMUNITY): Payer: Self-pay | Admitting: Gynecologic Oncology

## 2014-12-27 DIAGNOSIS — M79609 Pain in unspecified limb: Secondary | ICD-10-CM

## 2014-12-27 LAB — CBC WITH DIFFERENTIAL/PLATELET
BASOS ABS: 0 10*3/uL (ref 0.0–0.1)
BASOS PCT: 0 % (ref 0–1)
Eosinophils Absolute: 0 10*3/uL (ref 0.0–0.7)
Eosinophils Relative: 0 % (ref 0–5)
HCT: 20.2 % — ABNORMAL LOW (ref 36.0–46.0)
Hemoglobin: 7.2 g/dL — ABNORMAL LOW (ref 12.0–15.0)
LYMPHS PCT: 20 % (ref 12–46)
Lymphs Abs: 2 10*3/uL (ref 0.7–4.0)
MCH: 33.6 pg (ref 26.0–34.0)
MCHC: 35.6 g/dL (ref 30.0–36.0)
MCV: 94.4 fL (ref 78.0–100.0)
MONO ABS: 1 10*3/uL (ref 0.1–1.0)
Monocytes Relative: 10 % (ref 3–12)
NEUTROS ABS: 6.9 10*3/uL (ref 1.7–7.7)
NEUTROS PCT: 70 % (ref 43–77)
Platelets: 150 10*3/uL (ref 150–400)
RBC: 2.14 MIL/uL — AB (ref 3.87–5.11)
RDW: 13.7 % (ref 11.5–15.5)
WBC: 9.9 10*3/uL (ref 4.0–10.5)

## 2014-12-27 LAB — BASIC METABOLIC PANEL
Anion gap: 3 — ABNORMAL LOW (ref 5–15)
BUN: 17 mg/dL (ref 6–23)
CO2: 25 mmol/L (ref 19–32)
CREATININE: 0.87 mg/dL (ref 0.50–1.10)
Calcium: 8.6 mg/dL (ref 8.4–10.5)
Chloride: 107 mmol/L (ref 96–112)
GFR calc Af Amer: 87 mL/min — ABNORMAL LOW (ref >90)
GFR calc non Af Amer: 75 mL/min — ABNORMAL LOW (ref >90)
Glucose, Bld: 113 mg/dL — ABNORMAL HIGH (ref 70–99)
POTASSIUM: 4.7 mmol/L (ref 3.5–5.1)
Sodium: 135 mmol/L (ref 135–145)

## 2014-12-27 LAB — CBC
HEMATOCRIT: 21.6 % — AB (ref 36.0–46.0)
Hemoglobin: 7.6 g/dL — ABNORMAL LOW (ref 12.0–15.0)
MCH: 33 pg (ref 26.0–34.0)
MCHC: 35.2 g/dL (ref 30.0–36.0)
MCV: 93.9 fL (ref 78.0–100.0)
Platelets: 142 10*3/uL — ABNORMAL LOW (ref 150–400)
RBC: 2.3 MIL/uL — ABNORMAL LOW (ref 3.87–5.11)
RDW: 13.5 % (ref 11.5–15.5)
WBC: 9.1 10*3/uL (ref 4.0–10.5)

## 2014-12-27 MED ORDER — ENOXAPARIN SODIUM 40 MG/0.4ML ~~LOC~~ SOLN
40.0000 mg | SUBCUTANEOUS | Status: DC
Start: 2014-12-27 — End: 2014-12-28
  Administered 2014-12-27: 40 mg via SUBCUTANEOUS
  Filled 2014-12-27 (×2): qty 0.4

## 2014-12-27 MED ORDER — HYDROCODONE-ACETAMINOPHEN 5-325 MG PO TABS
1.0000 | ORAL_TABLET | ORAL | Status: DC | PRN
  Administered 2014-12-27 – 2014-12-29 (×6): 1 via ORAL
  Filled 2014-12-27 (×6): qty 1

## 2014-12-27 MED ORDER — ACETAMINOPHEN 325 MG PO TABS
650.0000 mg | ORAL_TABLET | Freq: Four times a day (QID) | ORAL | Status: DC
  Filled 2014-12-27 (×9): qty 2

## 2014-12-27 MED ORDER — ENOXAPARIN (LOVENOX) PATIENT EDUCATION KIT
PACK | Freq: Once | Status: DC
  Filled 2014-12-27: qty 1

## 2014-12-27 MED ORDER — SODIUM CHLORIDE 0.9 % IV BOLUS (SEPSIS)
500.0000 mL | Freq: Once | INTRAVENOUS | Status: AC
  Administered 2014-12-27: 500 mL via INTRAVENOUS

## 2014-12-27 MED ORDER — LORAZEPAM 2 MG/ML IJ SOLN
0.5000 mg | Freq: Four times a day (QID) | INTRAMUSCULAR | Status: DC | PRN
  Administered 2014-12-27 – 2014-12-28 (×2): 0.5 mg via INTRAVENOUS
  Filled 2014-12-27 (×2): qty 1

## 2014-12-27 NOTE — Progress Notes (Signed)
*  Preliminary Results* Bilateral lower extremity venous duplex completed. Bilateral lower extremities are negative for deep vein thrombosis. There is no evidence of Baker's cyst bilaterally.  12/27/2014  Maudry Mayhew, RVT, RDCS, RDMS

## 2014-12-27 NOTE — Progress Notes (Signed)
1 Day Post-Op Procedure(s) (LRB): EXPLORATORY LAPAROSCOPY  RIGHT SALPINGO OOPHORECTOMY OMENTECTOMY LYMPHADECTOMY  (Right)  LAPAROTOMY  (N/A)  Subjective: Patient reports moderate left lower leg pain that began on Saturday before surgery after taking oxycodone.  She feels that the pain may be related to taking oxycodone because she "has reactions to other drugs too."  The pain became severe last pm and she states it started near her foot and "shot up the whole leg."  Unable to bear weight per patient and only able to do bed to chair transfer.  Tolerating solid food this am with no nausea or emesis.  Passing flatus and belching.  Denies chest pain.  Shortness of breath reported last pm when in severe pain but denies this am.  Denies having a bowel movement.  Denies dizziness or lightheadedness as well.  No other concerns voiced.  She would like to switch her pain medication to Lortab since she has taken that in the past and tolerated it well.      Objective: Vital signs in last 24 hours: Temp:  [96.8 F (36 C)-99.5 F (37.5 C)] 99.5 F (37.5 C) (02/03 0505) Pulse Rate:  [58-112] 98 (02/03 0505) Resp:  [10-22] 16 (02/03 0505) BP: (104-207)/(73-116) 131/78 mmHg (02/03 0505) SpO2:  [100 %] 100 % (02/03 0505) Last BM Date: 12/25/14  Intake/Output from previous day: 02/02 0701 - 02/03 0700 In: 3931.7 [P.O.:240; I.V.:3691.7] Out: 36 [Urine:605; Blood:200]  Physical Examination: General: alert, cooperative and no distress Resp: clear to auscultation bilaterally Cardio: regular rate and rhythm, S1, S2 normal, no murmur, click, rub or gallop GI: incision: midline incision, dressing stained and removed, minimal amount of oozing present along the incision under the umbilicus, silver nitrate applied and a pressure dressing applied, no signs of active bleeding after nitrate application and abdomen soft, active bowel sounds, tender around the incision, non-tympanic Extremities: extremities normal,  atraumatic, no cyanosis or edema and bilateral pedal pulses diminished, right foot warmer than the left, no edema or erythema present  Labs: WBC/Hgb/Hct/Plts:  9.1/7.6/21.6/142 (02/03 0451) BUN/Cr/glu/ALT/AST/amyl/lip:  17/0.87/--/--/--/--/-- (02/03 0451)  Assessment: 54 y.o. s/p Procedure(s): EXPLORATORY LAPAROSCOPY  RIGHT SALPINGO OOPHORECTOMY OMENTECTOMY LYMPHADECTOMY   LAPAROTOMY : stable Pain:  Pain is well-controlled on PRN medications.  Heme:  Hgb 7.6 and Hct 21.6 this am.  Plan to repeat CBC at 11:00.  CV: BP and HR stable.  Continue to monitor with ordered vital signs.  Hx HTN- HCTZ not ordered at this time.  GI:  Tolerating po: Yes     GU: 605 cc output.  Foley in place with clear, light yellow urine present.    FEN: Stable post-operatively.  Prophylaxis: PAS.  Lovenox on hold at this time until repeat CBC at 11:00.  Plan: Bilateral LE venous doppler 500 cc NS bolus now CBC at 11:00 Hold lovenox until CBC results available PT/OT to evaluate LLE limitations and to assess for discharge needs Foley to be discontinued later today if output improved Change pain medication to Lortab per pt request Encourage mobility, IS use, deep breathing, and coughing Continue post-operative plan of care per Dr. Delsa Sale   LOS: 1 day    Catherine Rogers 12/27/2014, 10:40 AM

## 2014-12-27 NOTE — Progress Notes (Signed)
OT Cancellation Note  Patient Details Name: Brookelynn Hamor MRN: [id number] DOB: 1961-01-29   Cancelled Treatment:    Reason Eval/Treat Not Completed: Other (comment).  Awaiting LE venous dopplers.  Will check back tomorrow.  Jj Enyeart 12/27/2014, 3:08 PM  Lesle Chris, OTR/L [phone] 12/27/2014

## 2014-12-27 NOTE — Progress Notes (Signed)
Pt. Complaining again about left leg pain,numbness, . Did an assessment on the affected leg cool to touch,we check the pulse using doppler with the [facility] and confirm it with another nurse . Femoral pulse (+) strong, Popliteal pulse (weak and hard to locate), Pedal pulse (-). Md was notified about the interventions.Marland Kitchenand prescribed Ativan for anxiety..will continue to monitor.

## 2014-12-27 NOTE — Anesthesia Postprocedure Evaluation (Signed)
  Anesthesia Post-op Note  Patient: Catherine Rogers  Procedure(s) Performed: Procedure(s) (LRB): EXPLORATORY LAPAROSCOPY  RIGHT SALPINGO OOPHORECTOMY OMENTECTOMY LYMPHADECTOMY  (Right)  LAPAROTOMY  (N/A)  Patient Location: PACU  Anesthesia Type: General  Level of Consciousness: awake and alert   Airway and Oxygen Therapy: Patient Spontanous Breathing  Post-op Pain: mild  Post-op Assessment: Post-op Vital signs reviewed, Patient's Cardiovascular Status Stable, Respiratory Function Stable, Patent Airway and No signs of Nausea or vomiting  Last Vitals:  Filed Vitals:   12/27/14 0505  BP: 131/78  Pulse: 98  Temp: 37.5 C  Resp: 16    Post-op Vital Signs: stable   Complications: No apparent anesthesia complications

## 2014-12-27 NOTE — Evaluation (Signed)
Physical Therapy Evaluation Patient Details Name: Catherine Rogers MRN: [id number] DOB: 05-09-1961 Today's Date: 12/27/2014   History of Present Illness  54 yo female admitted 12/26/14 for  Diagnostic laparoscopy, exploratory laparotomy with right salpingo-oophorectomy, pelvic and para-aortic lymph node dissection, omentectomy for ovarian cancer . onset of LLE pain and sensory changes, Dopplers ordered to R/O DVT. RN gave clearance to evaluate patient befor Dopplers performed.  Clinical Impression  Patient presents with   Decreased active movement/weakness of entire L leg, impaired sensation of L leg with c/o "pins and needles in foot", noted foot drop and L knee hyperextension  During stance. Patient tearful at times. Patient will benefit from PT to address problems listed in note to  Assist in return to safe and functional mobility.     Follow Up Recommendations CIR    Equipment Recommendations  Rolling walker with 5" wheels    Recommendations for Other Services Rehab consult     Precautions / Restrictions Precautions Precautions: Fall      Mobility  Bed Mobility Overal bed mobility: Needs Assistance Bed Mobility: Supine to Sit;Sit to Supine     Supine to sit: HOB elevated;Min assist Sit to supine: Min assist   General bed mobility comments: pt  uses hands to move L leg to edge of bed,   assist for LLE back into bed.  Transfers Overall transfer level: Needs assistance Equipment used: Standard walker Transfers: Sit to/from Stand Sit to Stand: Mod assist;From elevated surface         General transfer comment: L knee  secured to ensure no buckling.  decreased descent, noted L kne hyperextension during stance  Ambulation/Gait Ambulation/Gait assistance: Mod assist Ambulation Distance (Feet): 5 Feet (x 2) Assistive device: Standard walker Gait Pattern/deviations: Step-to pattern;Steppage;Decreased weight shift to left;Decreased step length - left;Decreased stance time -  left;Decreased dorsiflexion - left     General Gait Details: noted  with decreased control of L leg movement to advance L leg , noted L foot drop during ambulation and L Knee hyperextends in stance .    Stairs            Wheelchair Mobility    Modified Rankin (Stroke Patients Only)       Balance Overall balance assessment: Needs assistance         Standing balance support: During functional activity;Bilateral upper extremity supported Standing balance-Leahy Scale: Poor Standing balance comment: decreased weight   bearing on LLE                              Pertinent Vitals/Pain Pain Assessment: 0-10 Pain Score: 8  Pain Location: pt reports shooting pain  from L buttock, down  to foot, reports "pins and needles" in / L foot,  Pain Descriptors / Indicators: Grimacing;Crying;Shooting;Tingling ("pins and needles in L foot.)    Home Living Family/patient expects to be discharged to:: Private residence Living Arrangements: Alone   Type of Home: House Home Access: Stairs to enter   [employer] of Steps: 1 Home Layout: Two level;Able to live on main level with bedroom/bathroom Home Equipment: None Additional Comments: pt stated she was to get a walker, ? why    Prior Function Level of Independence: Independent               Hand Dominance        Extremity/Trunk Assessment  Lower Extremity Assessment: LLE deficits/detail   LLE Deficits / Details: dorsi flexion  1/5, toe extension 1/5, plantarflexion 2, knee extension 2/5. kne flexion 2/5, hip flexion 2+  Cervical / Trunk Assessment: Normal  Communication   Communication: No difficulties  Cognition Arousal/Alertness: Awake/alert Behavior During Therapy: Anxious (emotional at times about situation, cannot get in touch w/ family) Overall Cognitive Status: Within Functional Limits for tasks assessed                      General Comments      Exercises         Assessment/Plan    PT Assessment    PT Diagnosis Difficulty walking;Abnormality of gait;Acute pain   PT Problem List    PT Treatment Interventions     PT Goals (Current goals can be found in the Care Plan section) Acute Rehab PT Goals Patient Stated Goal: to find out what is wrong PT Goal Formulation: With patient Time For Goal Achievement: 01/10/15 Potential to Achieve Goals: Good    Frequency     Barriers to discharge        Co-evaluation               End of Session Equipment Utilized During Treatment: Gait belt Activity Tolerance: Patient limited by pain Patient left: in bed;with call bell/phone within reach Nurse Communication: Mobility status (LLE neuro deficits  )         Time: 1457-1530 PT Time Calculation (min) (ACUTE ONLY): 33 min   Charges:   PT Evaluation $Initial PT Evaluation Tier I: 1 Procedure PT Treatments $Gait Training: 8-22 mins   PT G Codes:        [name] 12/27/2014, 4:30 PM Tresa Endo PT [phone]

## 2014-12-28 ENCOUNTER — Encounter (HOSPITAL_COMMUNITY): Payer: Self-pay | Admitting: Radiology

## 2014-12-28 ENCOUNTER — Inpatient Hospital Stay (HOSPITAL_COMMUNITY): Payer: Medicare Other

## 2014-12-28 DIAGNOSIS — R29898 Other symptoms and signs involving the musculoskeletal system: Secondary | ICD-10-CM

## 2014-12-28 DIAGNOSIS — D62 Acute posthemorrhagic anemia: Secondary | ICD-10-CM

## 2014-12-28 DIAGNOSIS — R208 Other disturbances of skin sensation: Secondary | ICD-10-CM

## 2014-12-28 LAB — CBC WITH DIFFERENTIAL/PLATELET
BASOS ABS: 0 10*3/uL (ref 0.0–0.1)
BASOS PCT: 0 % (ref 0–1)
Basophils Absolute: 0 10*3/uL (ref 0.0–0.1)
Basophils Relative: 0 % (ref 0–1)
EOS ABS: 0 10*3/uL (ref 0.0–0.7)
EOS ABS: 0 10*3/uL (ref 0.0–0.7)
EOS PCT: 0 % (ref 0–5)
Eosinophils Relative: 0 % (ref 0–5)
HEMATOCRIT: 18.3 % — AB (ref 36.0–46.0)
HEMATOCRIT: 18.6 % — AB (ref 36.0–46.0)
HEMOGLOBIN: 6.5 g/dL — AB (ref 12.0–15.0)
Hemoglobin: 6.4 g/dL — CL (ref 12.0–15.0)
LYMPHS ABS: 2.2 10*3/uL (ref 0.7–4.0)
LYMPHS PCT: 30 % (ref 12–46)
Lymphocytes Relative: 38 % (ref 12–46)
Lymphs Abs: 3 10*3/uL (ref 0.7–4.0)
MCH: 33.2 pg (ref 26.0–34.0)
MCH: 33.3 pg (ref 26.0–34.0)
MCHC: 35 g/dL (ref 30.0–36.0)
MCHC: 34.9 g/dL (ref 30.0–36.0)
MCV: 94.8 fL (ref 78.0–100.0)
MCV: 95.4 fL (ref 78.0–100.0)
MONO ABS: 0.6 10*3/uL (ref 0.1–1.0)
MONOS PCT: 10 % (ref 3–12)
Monocytes Absolute: 0.7 10*3/uL (ref 0.1–1.0)
Monocytes Relative: 8 % (ref 3–12)
Neutro Abs: 4.3 10*3/uL (ref 1.7–7.7)
Neutro Abs: 4.5 10*3/uL (ref 1.7–7.7)
Neutrophils Relative %: 54 % (ref 43–77)
Neutrophils Relative %: 60 % (ref 43–77)
Platelets: 141 10*3/uL — ABNORMAL LOW (ref 150–400)
Platelets: 127 10*3/uL — ABNORMAL LOW (ref 150–400)
RBC: 1.93 MIL/uL — AB (ref 3.87–5.11)
RBC: 1.95 MIL/uL — ABNORMAL LOW (ref 3.87–5.11)
RDW: 14.4 % (ref 11.5–15.5)
RDW: 14.5 % (ref 11.5–15.5)
WBC: 7.9 10*3/uL (ref 4.0–10.5)
WBC: 7.4 10*3/uL (ref 4.0–10.5)

## 2014-12-28 LAB — BASIC METABOLIC PANEL
Anion gap: 6 (ref 5–15)
BUN: 15 mg/dL (ref 6–23)
CO2: 25 mmol/L (ref 19–32)
Calcium: 8.5 mg/dL (ref 8.4–10.5)
Chloride: 109 mmol/L (ref 96–112)
Creatinine, Ser: 0.81 mg/dL (ref 0.50–1.10)
GFR, EST NON AFRICAN AMERICAN: 81 mL/min — AB (ref >90)
GLUCOSE: 96 mg/dL (ref 70–99)
Potassium: 4.1 mmol/L (ref 3.5–5.1)
SODIUM: 140 mmol/L (ref 135–145)

## 2014-12-28 LAB — PREPARE RBC (CROSSMATCH)

## 2014-12-28 LAB — GLUCOSE, CAPILLARY: GLUCOSE-CAPILLARY: 114 mg/dL — AB (ref 70–99)

## 2014-12-28 MED ORDER — GABAPENTIN 100 MG PO CAPS
100.0000 mg | ORAL_CAPSULE | Freq: Two times a day (BID) | ORAL | Status: DC
  Administered 2014-12-28 – 2014-12-29 (×3): 100 mg via ORAL
  Filled 2014-12-28 (×4): qty 1

## 2014-12-28 MED ORDER — SODIUM CHLORIDE 0.9 % IV SOLN
Freq: Once | INTRAVENOUS | Status: AC
  Administered 2014-12-28: 10 mL via INTRAVENOUS

## 2014-12-28 MED ORDER — FERROUS SULFATE 325 (65 FE) MG PO TABS
325.0000 mg | ORAL_TABLET | Freq: Two times a day (BID) | ORAL | Status: DC
  Administered 2014-12-28 – 2014-12-29 (×2): 325 mg via ORAL
  Filled 2014-12-28 (×4): qty 1

## 2014-12-28 MED ORDER — IOHEXOL 300 MG/ML  SOLN
100.0000 mL | Freq: Once | INTRAMUSCULAR | Status: AC | PRN
  Administered 2014-12-28: 100 mL via INTRAVENOUS

## 2014-12-28 MED ORDER — IOHEXOL 300 MG/ML  SOLN
25.0000 mL | INTRAMUSCULAR | Status: AC
Start: 2014-12-28 — End: 2014-12-28
  Administered 2014-12-28 (×2): 25 mL via ORAL

## 2014-12-28 NOTE — Progress Notes (Addendum)
Rehab Admissions Coordinator Note:  Patient was screened by Cleatrice Burke for appropriateness for an Inpatient Acute Rehab Consult per PT recommendation.  At this time, we are recommending await further medical workup of LLE isues and progress with therpay before determining disposition .  Cleatrice Burke 12/28/2014, 7:38 AM  I can be reached at [phone].

## 2014-12-28 NOTE — Progress Notes (Signed)
Spoke with Dr Denman George patient states to staff she would take blood previously ordered.  Dr Denman George to place order

## 2014-12-28 NOTE — Progress Notes (Signed)
CRITICAL VALUE ALERT  Critical value received:Hgb 6.4  Date of notification:12/28/14  Time of notification:0944  Critical value read back:Yes.    Nurse who received alert:Nithila Sumners, RN  MD notified (1st page):Melissa Elinor Parkinson, NP  Time of first page:0951  MD notified (2nd page):  Time of second page:  Responding IO:[email or web address] Elinor Parkinson, NP  Time MD responded:0954

## 2014-12-28 NOTE — Progress Notes (Signed)
2 Days Post-Op Procedure(s) (LRB): EXPLORATORY LAPAROSCOPY  RIGHT SALPINGO OOPHORECTOMY OMENTECTOMY LYMPHADECTOMY  (Right)  LAPAROTOMY  (N/A)  Subjective: Patient reports improvement in left lower leg pain.  Up with assistance and states working with PT was beneficial.  Tolerating liquids this am and stating she does not want to eat any solid food due to mild nausea but willing to try chicken noodle soup.  Reporting nausea last pm after smelling her food with no emesis.  Reporting adequate pain relief with PRN medications.  Passing flatus and belching.  Denies chest pain and dyspnea.  Denies having a bowel movement.  Denies dizziness or lightheadedness as well.  No other concerns voiced.       Objective: Vital signs in last 24 hours: Temp:  [98.7 F (37.1 C)-100.3 F (37.9 C)] 98.7 F (37.1 C) (02/04 0546) Pulse Rate:  [102-119] 119 (02/04 0546) Resp:  [16-18] 18 (02/04 0546) BP: (129-149)/(76-84) 149/84 mmHg (02/04 0546) SpO2:  [99 %-100 %] 99 % (02/04 0546) Last BM Date: 12/25/14  Intake/Output from previous day: 02/03 0701 - 02/04 0700 In: 1540 [P.O.:640; I.V.:400; IV Piggyback:500] Out: 2300 [Urine:2300]  Physical Examination: General: alert, cooperative and no distress Resp: clear to auscultation bilaterally Cardio: regular rate and rhythm, S1, S2 normal, no murmur, click, rub or gallop and tachycardic at times GI: incision: midline incision with staples, dressing removed, no signs of active bleeding or drainage and abdomen soft, active bowel sounds, tender around the incision, non-tympanic Extremities: extremities normal, atraumatic, no cyanosis or edema and pedal pulses slightly diminished, equal warmth bilaterally  Labs: WBC/Hgb/Hct/Plts:  7.9/6.4/18.3/141 (02/04 8546)    Assessment: 54 y.o. s/p Procedure(s): EXPLORATORY LAPAROSCOPY  RIGHT SALPINGO OOPHORECTOMY OMENTECTOMY LYMPHADECTOMY   LAPAROTOMY : stable Pain:  Pain is well-controlled on PRN medications.  Heme:   Hgb 6.4 and Hct 18.3 this am.    CV: BP and HR stable.  Continue to monitor with ordered vital signs.  Hx HTN- HCTZ not ordered at this time.  GI:  Tolerating po: Yes     GU: Adequate output reported   FEN: Stable post-operatively.  Prophylaxis: PAS.  Lovenox on hold at this time due to decrease in H&H  Plan:   Hold lovenox  PT/OT to evaluate LLE limitations and to assess for discharge needs Encourage mobility, IS use, deep breathing, and coughing Continue post-operative plan of care per Dr. Denman George   LOS: 2 days    CROSS, MELISSA DEAL 12/28/2014, 10:39 AM      I saw and examined the patient and agree with the above note. Keiley is postop day 2 status post exploratory laparotomy right salpingo oophorectomy, omentectomy, pelvic and para-aortic lymph node dissection. Postoperatively she is experienced acute blood loss anemia. Today's hemoglobin is 6.4. She is symptomatic with this. She has tachycardia and dizziness on ambulation. Her blood pressure is within normal limits. She is not manifesting signs of hemodynamic instability, or shock.  She is reporting left lower extremity weakness and pain. The patient reported to me in the preoperative holding area on Tuesday, February 2 that she began experiencing these symptoms approximate 4 days before surgery. They have remain persistent postoperatively. Lower extremity Doppler was negative for DVT.  On examination her incision is clean dry and intact. Her abdomen is mildly distended and tympanitic.  Assessment and plan:  1/ acute blood loss anemia (symptomatic): transfuse 2 units PRBC. I counseled the patient about the risks and benefits of transfusion and my recommendation for this. I've also ordered a CT  scan of the abdomen and pelvis to evaluate for a source of bleeding. I anticipate is most likely from the para-aortic node dissection bed with a right gonadal vessels were bleeding intraoperatively. Of note the site was hemostatic at  completion of the surgery.  2/ LLE weakness and numbness: We have consulted inpatient neurology to evaluate the patient. A clear localizing source is not apparent. I have a low suspicion for a postoperative neuropathy (associated with positioning or retractor blade replacement) because she was reporting the symptoms to me preoperatively. 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 She also has a history significant for hypertension which is poorly controlled. Therefore I would value neurology's input regarding the potential for these symptoms being caused by either stroke or metastatic disease.  3/ postop: Continue inpatient care. Her GI function is appropriate postop she's passing flatus and is tolerating by mouth. Continue analgesia for pain relief. I have prescribed Neurontin for her left lower extremity discomfort.  [name], MD

## 2014-12-28 NOTE — Evaluation (Signed)
Occupational Therapy Evaluation Patient Details Name: Catherine Rogers MRN: [id number] DOB: 03/18/1961 Today's Date: 12/28/2014    History of Present Illness 54 yo female admitted 12/26/14 for  Diagnostic laparoscopy, exploratory laparotomy with right salpingo-oophorectomy, pelvic and para-aortic lymph node dissection, omentectomy for ovarian cancer . onset of LLE pain and sensory changes, negative for DVT by Doppler.   Clinical Impression   Pt admitted with LLE pain . Pt currently with functional limitations due to the deficits listed below (see OT Problem List).  Pt will benefit from skilled OT to increase their safety and independence with ADL and functional mobility for ADL to facilitate discharge to venue listed below.      Follow Up Recommendations  CIR    Equipment Recommendations  Other (comment)    Recommendations for Other Services       Precautions / Restrictions Precautions Precautions: Fall Restrictions Weight Bearing Restrictions: No      Mobility Bed Mobility Overal bed mobility: Needs Assistance Bed Mobility: Supine to Sit;Sit to Supine     Supine to sit: HOB elevated;Min assist Sit to supine: Min assist   General bed mobility comments: pt  uses hands to move L leg to edge of bed,   assist for LLE back into bed.  Transfers Overall transfer level: Needs assistance Equipment used: Standard walker   Sit to Stand: From elevated surface;Max assist         General transfer comment: L knee  secured to ensure no buckling.  decreased descent, noted L kne hyperextension during stance    Balance             Standing balance-Leahy Scale: Poor                              ADL Overall ADL's : Needs assistance/impaired Eating/Feeding: Set up;Sitting   Grooming: Sitting;Set up   Upper Body Bathing: Set up;Sitting   Lower Body Bathing: Cueing for safety;Maximal assistance;Sit to/from stand   Upper Body Dressing : Set up;Sitting   Lower  Body Dressing: Maximal assistance;Sit to/from stand   Toilet Transfer: Maximal assistance;Stand-pivot;BSC   Toileting- Clothing Manipulation and Hygiene: Sit to/from stand;Maximal assistance;Total assistance               Vision                     Perception     Praxis      Pertinent Vitals/Pain Pain Score: 6  Pain Location: L foot Pain Descriptors / Indicators: Tingling Pain Intervention(s): Limited activity within patient's tolerance;Monitored during session;Repositioned     Hand Dominance     Extremity/Trunk Assessment Upper Extremity Assessment Upper Extremity Assessment: Generalized weakness           Communication Communication Communication: No difficulties   Cognition Arousal/Alertness: Awake/alert Behavior During Therapy: Anxious (emotional at times about situation, cannot get in touch w/ family) Overall Cognitive Status: Within Functional Limits for tasks assessed                     General Comments       Exercises       Shoulder Instructions      Home Living Family/patient expects to be discharged to:: Private residence Living Arrangements: Alone   Type of Home: House Home Access: Stairs to enter [employer] of Steps: 1   Home Layout: Two level;Able to live on main level with bedroom/bathroom  Bathroom Toilet: Standard     Home Equipment: None   Additional Comments: pt stated she was to get a walker, ? why      Prior Functioning/Environment Level of Independence: Independent             OT Diagnosis: Generalized weakness;Acute pain   OT Problem List: Decreased strength;Impaired sensation;Decreased activity tolerance;Impaired balance (sitting and/or standing);Decreased knowledge of use of DME or AE   OT Treatment/Interventions: Self-care/ADL training;DME and/or AE instruction;Patient/family education    OT Goals(Current goals can be found in the care plan section) Acute Rehab OT  Goals Patient Stated Goal: to find out what is wrong OT Goal Formulation: With patient Time For Goal Achievement: 01/11/15  OT Frequency: Min 2X/week   Barriers to D/C:            Co-evaluation              End of Session Nurse Communication: Mobility status  Activity Tolerance: Patient limited by pain Patient left: in chair;with call bell/phone within reach   Time: 1017-1031 OT Time Calculation (min): 14 min Charges:  OT General Charges $OT Visit: 1 Procedure OT Evaluation $Initial OT Evaluation Tier I: 1 Procedure G-Codes:    Payton Mccallum D 01/01/15, 10:59 AM

## 2014-12-28 NOTE — Progress Notes (Signed)
PT Cancellation Note  Patient Details Name: Catherine Rogers MRN: [id number] DOB: 10-20-1961   Cancelled Treatment:    Reason Eval/Treat Not Completed:  (noted hgb 6.4.  )   [name] 12/28/2014, 10:41 AM

## 2014-12-28 NOTE — Consult Note (Signed)
Reason for Consult:Left leg weakness and numbness Referring Physician: Delsa Sale  CC: Left leg weakness and numbness  HPI: Catherine Rogers is an 54 y.o. female with synchronous colon and ovarian cancer who was admitted for exploratory laparoscopy and right salpingo-oophorectomy that occurred on 12/26/2014.  Patient reports that on the weekend prior to her admission she began to note a tingling sensation in her LLE that was worse in the foot.  She noted this on 1/30 after taking her pain medication and thought that it was a side effect related to her medication.  Her symptoms worsened but she knew she was coming to the hospital this week and waited to present her symptoms at that time.  She reports that her symptoms are not as sever as they were initially but she continues to have tingling in the entire LLE up to and including her left buttock.  Her foot is the worst.  The leg is weak as well.  She has no bowel or bladder incontinence.    Past Medical History  Diagnosis Date  . Hypertension   . Ectopic pregnancy   . Head injury, closed, with brief LOC     had a cut- as a child  . Asthma   . Anxiety   . Depression   . PTSD (post-traumatic stress disorder)   . GERD (gastroesophageal reflux disease)   . Arthritis   . Pelvic cyst   . Complication of anesthesia     woke and saw long tube coming out of mouth . sat up then went back to sleep  . PONV (postoperative nausea and vomiting)   . Family history of adverse reaction to anesthesia     sister on dialysis- hypotension   . Headache   . Cancer   . Anemia   . History of blood transfusion     2005 approximately     Past Surgical History  Procedure Laterality Date  . Esophagogastroduodenoscopy N/A 09/17/2014    Procedure: ESOPHAGOGASTRODUODENOSCOPY (EGD);  Surgeon: Jeryl Columbia, MD;  Location: [facility] ENDOSCOPY;  Service: Endoscopy;  Laterality: N/A;  . Abdominal hysterectomy    . Tubal ligation    . Partial colectomy Right 09/18/2014     Procedure: RIGHT HEMI COLECTOMY;  Surgeon: Doreen Salvage, MD;  Location: [place];  Service: General;  Laterality: Right;  . Ileostomy Right 09/18/2014    Procedure: ILEOCOLOSTOMY;  Surgeon: Doreen Salvage, MD;  Location: [place];  Service: General;  Laterality: Right;  . Laparotomy N/A 11/01/2014    Procedure: EXPLORATORY LAPAROTOMY;  Surgeon: Doreen Salvage, MD;  Location: [place] [facility];  Service: General;  Laterality: N/A;  . Mass excision  11/01/2014    Procedure: EXCISION PELVIC MASS;  Surgeon: Doreen Salvage, MD;  Location: [place];  Service: General;;  . Robotic assisted total hysterectomy with bilateral salpingo oopherectomy Right 12/26/2014    Procedure: EXPLORATORY LAPAROSCOPY  RIGHT SALPINGO OOPHORECTOMY OMENTECTOMY LYMPHADECTOMY ;  Surgeon: Everitt Amber, MD;  Location: [facility] ORS;  Service: Gynecology;  Laterality: Right;  . Laparotomy N/A 12/26/2014    Procedure:  LAPAROTOMY ;  Surgeon: Everitt Amber, MD;  Location: [facility] ORS;  Service: Gynecology;  Laterality: N/A;    Family History  Problem Relation Age of Onset  . Cancer Sister 62    brain cancer   . Cancer Brother 46    lung cancer     Social History:  reports that she has been smoking Cigars.  She has never used smokeless tobacco. She reports that she drinks about 3.5  oz of alcohol per week. She reports that she uses illicit drugs (Marijuana).  Allergies  Allergen Reactions  . Other Hives and Itching    Highly acidic foods "make me itch and break out" oranges, tomatoes  . Demerol [Meperidine] Nausea And Vomiting  . Ibuprofen Nausea And Vomiting  . Morphine And Related Nausea And Vomiting  . Penicillins Other (See Comments)    Childhood allergy  . Tylenol [Acetaminophen] Nausea And Vomiting    Medications:  I have reviewed the patient's current medications. Prior to Admission:  Prescriptions prior to admission  Medication Sig Dispense Refill Last Dose  . docusate sodium 100 MG CAPS Take 100 mg by mouth 2 (two) times daily as needed for mild constipation. 10  capsule 0 12/25/2014 at am  . hydrochlorothiazide (HYDRODIURIL) 25 MG tablet Take 25 mg by mouth every morning.    12/25/2014 at am  . ibuprofen (ADVIL,MOTRIN) 800 MG tablet Take 800 mg by mouth every 8 (eight) hours as needed for mild pain.    Past Month at Unknown time  . methocarbamol (ROBAXIN) 500 MG tablet Take 500 mg by mouth every 8 (eight) hours as needed for muscle spasms.    12/25/2014 at am  . mirtazapine (REMERON) 15 MG tablet Take 1 tablet (15 mg total) by mouth at bedtime. (Patient taking differently: Take 15 mg by mouth at bedtime. As needed) 30 tablet 2 Past Month at Unknown time  . ondansetron (ZOFRAN) 8 MG tablet Take 1 tablet (8 mg total) by mouth every 8 (eight) hours as needed for nausea or vomiting. 20 tablet 3 Past Week at Unknown time  . oxyCODONE (OXY IR/ROXICODONE) 5 MG immediate release tablet 1-2 tabs PO Q 4 hours PRN pain. 60 tablet 0 12/25/2014 at pm  . ranitidine (ZANTAC) 150 MG tablet Take 150 mg by mouth 2 (two) times daily as needed for heartburn.    12/25/2014 at am   Scheduled: . acetaminophen  650 mg Oral Q6H  . enoxaparin   Does not apply Once  . famotidine  20 mg Oral BID  . feeding supplement (ENSURE COMPLETE)  237 mL Oral BID BM  . ferrous sulfate  325 mg Oral BID WC  . gabapentin  100 mg Oral BID  . ibuprofen  800 mg Oral Q6H  . mirtazapine  15 mg Oral QHS    ROS: History obtained from the patient  General ROS: negative for - chills, fatigue, fever, night sweats, weight gain or weight loss Psychological ROS: negative for - behavioral disorder, hallucinations, memory difficulties, mood swings or suicidal ideation Ophthalmic ROS: negative for - blurry vision, double vision, eye pain or loss of vision ENT ROS: negative for - epistaxis, nasal discharge, oral lesions, sore throat, tinnitus or vertigo Allergy and Immunology ROS: negative for - hives or itchy/watery eyes Hematological and Lymphatic ROS: negative for - bleeding problems, bruising or swollen lymph  nodes Endocrine ROS: negative for - galactorrhea, hair pattern changes, polydipsia/polyuria or temperature intolerance Respiratory ROS: negative for - cough, hemoptysis, shortness of breath or wheezing Cardiovascular ROS: negative for - chest pain, dyspnea on exertion, edema or irregular heartbeat Gastrointestinal ROS: negative for - abdominal pain, diarrhea, hematemesis, nausea/vomiting or stool incontinence Genito-Urinary ROS: negative for - dysuria, hematuria, incontinence or urinary frequency/urgency Musculoskeletal ROS: as noted in HPI Neurological ROS: as noted in HPI Dermatological ROS: negative for rash and skin lesion changes  Physical Examination: Blood pressure 147/95, pulse 97, temperature 98.7 F (37.1 C), temperature source Oral, resp. rate 16,  height 5\' 7"  (1.702 m), weight 62.681 kg (138 lb 3 oz), SpO2 100 %.  HEENT-  Normocephalic, no lesions, without obvious abnormality.  Normal external eye and conjunctiva.  Normal TM's bilaterally.  Normal auditory canals and external ears. Normal external nose, mucus membranes and septum.  Normal pharynx. Cardiovascular- S1, S2 normal, pulses palpable throughout   Lungs- chest clear, no wheezing, rales, normal symmetric air entry Abdomen- soft, non-tender; bowel sounds normal; no masses,  no organomegaly Extremities- no edema Lymph-no adenopathy palpable Musculoskeletal-no joint tenderness, deformity or swelling Skin-warm and dry, no hyperpigmentation, vitiligo, or suspicious lesions  Neurological Examination Mental Status: Alert, oriented, thought content appropriate.  Speech fluent without evidence of aphasia.  Able to follow 3 step commands without difficulty. Cranial Nerves: II: Discs flat bilaterally; Visual fields grossly normal, pupils equal, round, reactive to light and accommodation III,IV, VI: ptosis not present, extra-ocular motions intact bilaterally V,VII: smile symmetric, facial light touch sensation normal  bilaterally VIII: hearing normal bilaterally IX,X: gag reflex present XI: bilateral shoulder shrug XII: midline tongue extension Motor: Right : Upper extremity   5/5    Left:     Upper extremity   5/5  Lower extremity   5/5     Lower extremity   4-/5 diffusely with give-way weakness noted  Tone and bulk:normal tone throughout; no atrophy noted Sensory: Pinprick and light touch decreased in the LLE.  Temperature sensation intact.   Deep Tendon Reflexes: 2+ and symmetric in the upper extremities and at the knee jerks.  Ankle jerks absent bilaterally Plantars: Right: downgoing   Left: mute Cerebellar: normal finger-to-nose and normal heel-to-shin testing bilaterally Gait: unable to test due to weakness   Laboratory Studies:   Basic Metabolic Panel:  Recent Labs Lab 12/22/14 1425 12/27/14 0451 12/28/14 0918  NA 142 135 140  K 4.0 4.7 4.1  CL 110 107 109  CO2 26 25 25   GLUCOSE 110* 113* 96  BUN 16 17 15   CREATININE 0.65 0.87 0.81  CALCIUM 9.4 8.6 8.5    Liver Function Tests:  Recent Labs Lab 12/22/14 1425  AST 22  ALT 16  ALKPHOS 79  BILITOT 0.6  PROT 7.4  ALBUMIN 4.3   No results for input(s): LIPASE, AMYLASE in the last 168 hours. No results for input(s): AMMONIA in the last 168 hours.  CBC:  Recent Labs Lab 12/22/14 1425 12/27/14 0451 12/27/14 1119 12/28/14 0918 12/28/14 1401  WBC 7.0 9.1 9.9 7.9 7.4  NEUTROABS 3.6  --  6.9 4.3 4.5  HGB 11.7* 7.6* 7.2* 6.4* 6.5*  HCT 33.8* 21.6* 20.2* 18.3* 18.6*  MCV 96.0 93.9 94.4 94.8 95.4  PLT 206 142* 150 141* 127*    Cardiac Enzymes: No results for input(s): CKTOTAL, CKMB, CKMBINDEX, TROPONINI in the last 168 hours.  BNP: Invalid input(s): POCBNP  CBG:  Recent Labs Lab 12/28/14 [place]*    Microbiology: No results found for this or any previous visit.  Coagulation Studies: No results for input(s): LABPROT, INR in the last 72 hours.  Urinalysis:  Recent Labs Lab 12/22/14 1328   COLORURINE YELLOW  LABSPEC 1.011  PHURINE 6.0  GLUCOSEU NEGATIVE  HGBUR NEGATIVE  BILIRUBINUR NEGATIVE  KETONESUR NEGATIVE  PROTEINUR NEGATIVE  UROBILINOGEN 0.2  NITRITE NEGATIVE  LEUKOCYTESUR NEGATIVE    Lipid Panel:  No results found for: CHOL, TRIG, HDL, CHOLHDL, VLDL, LDLCALC  HgbA1C: No results found for: HGBA1C  Urine Drug Screen:  No results found for: LABOPIA, COCAINSCRNUR, [place], AMPHETMU, THCU,  LABBARB  Alcohol Level: No results for input(s): ETH in the last 168 hours.  Imaging: No results found.   Assessment/Plan: 54 year old female with complaints of left lower extremity numbness and weakness.  Patient with history of colon and ovarian cancer s/p surgery.  Chemotherapy planned.  Can not rule out the possibility of metastasis.  Patient to have a CT of her abdomen and pelvis today.  Would also evaluate her thoracic and lumbar spines.    Recommendations: 1.  CT of thoracic and lumbar spine.  Patient with staples in abdomen so patient can not have a MRI at this time.  If all imaging unremarkable. LP may be required.  Will continue to follow with you.    Alexis Goodell, MD [facility] [phone] 12/28/2014, 3:15 PM

## 2014-12-29 LAB — CBC WITH DIFFERENTIAL/PLATELET
Basophils Absolute: 0 10*3/uL (ref 0.0–0.1)
Basophils Relative: 0 % (ref 0–1)
EOS ABS: 0.1 10*3/uL (ref 0.0–0.7)
Eosinophils Relative: 1 % (ref 0–5)
HEMATOCRIT: 27.2 % — AB (ref 36.0–46.0)
Hemoglobin: 9.5 g/dL — ABNORMAL LOW (ref 12.0–15.0)
Lymphocytes Relative: 31 % (ref 12–46)
Lymphs Abs: 2.1 10*3/uL (ref 0.7–4.0)
MCH: 32.1 pg (ref 26.0–34.0)
MCHC: 34.9 g/dL (ref 30.0–36.0)
MCV: 91.9 fL (ref 78.0–100.0)
MONOS PCT: 9 % (ref 3–12)
Monocytes Absolute: 0.6 10*3/uL (ref 0.1–1.0)
Neutro Abs: 3.8 10*3/uL (ref 1.7–7.7)
Neutrophils Relative %: 58 % (ref 43–77)
PLATELETS: 128 10*3/uL — AB (ref 150–400)
RBC: 2.96 MIL/uL — AB (ref 3.87–5.11)
RDW: 15.9 % — ABNORMAL HIGH (ref 11.5–15.5)
WBC: 6.6 10*3/uL (ref 4.0–10.5)

## 2014-12-29 LAB — TYPE AND SCREEN
ABO/RH(D): B POS
Antibody Screen: NEGATIVE
UNIT DIVISION: 0
Unit division: 0

## 2014-12-29 LAB — BASIC METABOLIC PANEL
Anion gap: 8 (ref 5–15)
BUN: 13 mg/dL (ref 6–23)
CHLORIDE: 105 mmol/L (ref 96–112)
CO2: 24 mmol/L (ref 19–32)
Calcium: 8.5 mg/dL (ref 8.4–10.5)
Creatinine, Ser: 0.75 mg/dL (ref 0.50–1.10)
GFR calc Af Amer: >90 mL/min (ref >90)
GFR calc non Af Amer: >90 mL/min (ref >90)
Glucose, Bld: 83 mg/dL (ref 70–99)
Potassium: 3.9 mmol/L (ref 3.5–5.1)
SODIUM: 137 mmol/L (ref 135–145)

## 2014-12-29 LAB — PHOSPHORUS: Phosphorus: 3.4 mg/dL (ref 2.3–4.6)

## 2014-12-29 LAB — MAGNESIUM: Magnesium: 1.8 mg/dL (ref 1.5–2.5)

## 2014-12-29 MED ORDER — FERROUS SULFATE 325 (65 FE) MG PO TABS: 325.0000 mg | ORAL_TABLET | Freq: Two times a day (BID) | ORAL | Status: DC

## 2014-12-29 MED ORDER — GABAPENTIN 100 MG PO CAPS: 100.0000 mg | ORAL_CAPSULE | Freq: Two times a day (BID) | ORAL | Status: DC

## 2014-12-29 MED ORDER — ENOXAPARIN SODIUM 40 MG/0.4ML ~~LOC~~ SOLN
40.0000 mg | Freq: Every day | SUBCUTANEOUS | Status: DC
  Administered 2014-12-29: 40 mg via SUBCUTANEOUS
  Filled 2014-12-29: qty 0.4

## 2014-12-29 MED ORDER — ENOXAPARIN SODIUM 40 MG/0.4ML ~~LOC~~ SOLN: 40.0000 mg | Freq: Every day | SUBCUTANEOUS | Status: DC

## 2014-12-29 MED ORDER — ENOXAPARIN (LOVENOX) PATIENT EDUCATION KIT
PACK | Freq: Once | Status: AC
  Administered 2014-12-29: 11:00:00
  Filled 2014-12-29: qty 1

## 2014-12-29 MED ORDER — HYDROCODONE-ACETAMINOPHEN 5-325 MG PO TABS: 1.0000 | ORAL_TABLET | ORAL | Status: DC | PRN

## 2014-12-29 NOTE — Discharge Instructions (Signed)
12/29/2014  Return to work: 4-6 weeks if applicable  Activity: 1. Be up and out of the bed during the day.  Take a nap if needed.  You may walk up steps but be careful and use the hand rail.  Stair climbing will tire you more than you think, you may need to stop part way and rest.   2. No lifting or straining for 6 weeks.  3. No driving for 2 week(s).  Do not drive if you are taking narcotic pain medicine.  4. Shower daily.  Use soap and water on your incision and pat dry; don't rub.  No tub baths until cleared by your surgeon.   5. No sexual activity and nothing in the vagina for 6 weeks.  Diet: 1. Low sodium Heart Healthy Diet is recommended.  2. It is safe to use a laxative, such as Miralax or Colace, if you have difficulty moving your bowels.   Wound Care: 1. Keep clean and dry.  Shower daily.  Reasons to call the Doctor:  Fever - Oral temperature greater than 100.4 degrees Fahrenheit  Foul-smelling vaginal discharge  Difficulty urinating  Nausea and vomiting  Increased pain at the site of the incision that is unrelieved with pain medicine.  Difficulty breathing with or without chest pain  New calf pain especially if only on one side  Sudden, continuing increased vaginal bleeding with or without clots.   Contacts: For questions or concerns you should contact:  Dr. Everitt Amber at [phone]  Joylene John, NP at [phone]  After Hours: call [phone] and ask for the GYN Oncologist on call

## 2014-12-29 NOTE — Progress Notes (Signed)
Occupational Therapy Treatment Patient Details Name: Catherine Rogers MRN: [id number] DOB: 1961/10/05 Today's Date: 12/29/2014    History of present illness 54 yo female admitted 12/26/14 for  Diagnostic laparoscopy, exploratory laparotomy with right salpingo-oophorectomy, pelvic and para-aortic lymph node dissection, omentectomy for ovarian cancer . onset of LLE pain and sensory changes, negative for DVT by Doppler.   OT comments  Pt. Motivated to return home but remains limited with her abilities to participate in functional mobility secondary to c/o severe stomach and LLE pain.  Pt. Initially pushing for d/c home today but by end of session when she realized her physical limitations due to pain and weakness pt. Was more agreeable to need for continued therapies and need for assistance at home.  Max a for bed mobility and was unable to complete any OOB tasks at this time.  Assisted back to bed.  Follow Up Recommendations  CIR    Equipment Recommendations       Recommendations for Other Services      Precautions / Restrictions Precautions Precautions: Fall       Mobility Bed Mobility Overal bed mobility: Needs Assistance Bed Mobility: Rolling;Sidelying to Sit;Sit to Supine;Sit to Sidelying Rolling: Min assist Sidelying to sit: Max assist Supine to sit: Max assist Sit to supine: Max assist Sit to sidelying: Max assist General bed mobility comments: hob flat to simulate home environment, no rails, increased difficulty with "regular" bed set up.    Transfers                 General transfer comment: unable to participate in transfers today secondary to c/o stomach and LLE pain, able to scoot x3 towards hob prior to laying down to aide with better positioning once lying down    Balance                                   ADL Overall ADL's : Needs assistance/impaired                                       General ADL Comments: pt. limited this  session by complaints of severe stomach pain and LLE pain      Vision                     Perception     Praxis      Cognition   Behavior During Therapy: Anxious Overall Cognitive Status: Within Functional Limits for tasks assessed                       Extremity/Trunk Assessment               Exercises     Shoulder Instructions       General Comments      Pertinent Vitals/ Pain       Pain Assessment: 0-10 Pain Score: 8  Pain Location: L leg and stomach Pain Descriptors / Indicators: Aching Pain Intervention(s): Monitored during session;RN gave pain meds during session;Repositioned  Home Living                                          Prior Functioning/Environment  Frequency Min 2X/week     Progress Toward Goals  OT Goals(current goals can now be found in the care plan section)  Progress towards OT goals: Progressing toward goals     Plan Discharge plan remains appropriate    Co-evaluation                 End of Session     Activity Tolerance Patient limited by pain   Patient Left in bed;with call bell/phone within reach   Nurse Communication          Time: 4967-5916 OT Time Calculation (min): 19 min  Charges: OT General Charges $OT Visit: 1 Procedure OT Treatments $Self Care/Home Management : 8-22 mins  Janice Coffin, COTA/L 12/29/2014, 9:00 AM

## 2014-12-29 NOTE — Progress Notes (Signed)
3 Days Post-Op Procedure(s) (LRB): EXPLORATORY LAPAROSCOPY  RIGHT SALPINGO OOPHORECTOMY OMENTECTOMY LYMPHADECTOMY  (Right)  LAPAROTOMY  (N/A)  Subjective: Patient reports significant improvement in left lower leg mobility.  Ambulating without assistance.  Tolerating liquids this am and stating she cannot eat the food on her tray due to the strong smells.  Reporting adequate pain relief with PRN medications.  Passing flatus.  Denies chest pain and dyspnea.  Denies having a bowel movement.  Denies dizziness or lightheadedness this am.  No other concerns voiced.       Objective: Vital signs in last 24 hours: Temp:  [98.7 F (37.1 C)-100.5 F (38.1 C)] 99.2 F (37.3 C) (02/05 0600) Pulse Rate:  [90-106] 98 (02/05 0600) Resp:  [16-20] 18 (02/05 0600) BP: (134-162)/(79-100) 142/79 mmHg (02/05 0600) SpO2:  [98 %-100 %] 98 % (02/05 0600) Last BM Date: 12/25/14  Intake/Output from previous day: 02/04 0701 - 02/05 0700 In: 1658 [P.O.:960; I.V.:50; Blood:648] Out: 900 [Urine:900]  Physical Examination: General: alert, cooperative and no distress Resp: clear to auscultation bilaterally Cardio: regular rate and rhythm, S1, S2 normal, no murmur, click, rub or gallop and tachycardic at times GI: incision: midline incision with staples, dressing removed, no signs of active bleeding or drainage and abdomen soft, active bowel sounds, tender around the incision, non-tympanic Extremities: extremities normal, atraumatic, no cyanosis or edema and pedal pulses slightly diminished, equal warmth bilaterally  Labs: WBC/Hgb/Hct/Plts:  6.6/9.5/27.2/128 (02/05 0446) BUN/Cr/glu/ALT/AST/amyl/lip:  13/0.75/--/--/--/--/-- (02/05 0446)  Venous Dopplers on 02/03 negative for DVT  CT Abdomen/Pelvis IMPRESSION: 1. Postoperative ileus suspected with dilated small bowel and colon 2. Free air and free fluid in the abdomen/pelvis likely postoperative change. 3. Mild to moderate bladder distention. 4. Small pleural  effusions and bibasilar atelectasis. 5. No large abdominal or pelvic hematoma is identified to account for the patient's blood loss.  CT Lumbar Spine IMPRESSION: 1. Mild disc degeneration and moderate to severe lower lumbar facet arthrosis, worst at L4-5 where there is moderate spinal stenosis and moderate bilateral neural foraminal stenosis, progressed from prior MRI. 2. No evidence of osseous metastases in the lumbar spine.  CT Thoracic Spine IMPRESSION: 1. Mild thoracic spondylosis without evidence of significant stenosis. 2. No evidence of osseous metastases in the thoracic spine. 3. Moderate lower cervical disc degeneration with mild spinal stenosis at C6-7.  Assessment: 54 y.o. s/p Procedure(s): EXPLORATORY LAPAROSCOPY  RIGHT SALPINGO OOPHORECTOMY OMENTECTOMY LYMPHADECTOMY   LAPAROTOMY : stable Pain:  Pain is well-controlled on PRN medications.  Heme:  Hgb 9.5 and Hct 27.2 this am. S/p two units of PRBC on 12/28/14   CV: BP and HR stable.  Continue to monitor with ordered vital signs.  Hx HTN- HCTZ not ordered at this time.  GI:  Tolerating po: Yes     GU: Adequate output reported   FEN: Stable post-operatively.  Prophylaxis: PAS.  Lovenox on hold at this time due to decrease in H&H  Plan: [facility] Consult Lovenox teaching kit for discharge Plan for discharge later today if diet tolerated and cleared by neurology Encourage mobility, IS use, deep breathing, and coughing Continue post-operative plan of care per Dr. Denman George and Dr. Delsa Sale   LOS: 3 days    Catherine Rogers, Catherine Rogers 12/29/2014, 8:53 AM

## 2014-12-29 NOTE — Discharge Summary (Signed)
Physician Discharge Summary  Patient ID: Catherine Rogers MRN: [id number] DOB/AGE: 1960-11-25 54 y.o.  Admit date: 12/26/2014 Discharge date: 12/29/2014  Admission Diagnoses:  Ovarian Cancer   Discharge Diagnoses:  Active Problems:   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   Ovarian cancer   Postoperative anemia due to acute blood loss   Discharged Condition:  The patient is in good condition and stable for discharge.    Hospital Course: On 12/26/2014, the patient underwent the following: Procedure(s): EXPLORATORY LAPAROSCOPY  RIGHT SALPINGO OOPHORECTOMY OMENTECTOMY LYMPHADECTOMY   LAPAROTOMY .  The postoperative course was uneventful.  She was discharged to home on postoperative day 3 tolerating regular food, ensures and passing flatus.  Reporting improvement in left leg pain and weakness and reports ambulating without difficulty.  Also using rolling walker for extra support.  Patient to be discharged and to follow up with neurology outpatient per Dr. Delsa Rogers.  Patient not wanting to wait to see neurology before discharge.  Reportable signs and symptoms reviewed.  Consults: Neurology  Significant Diagnostic Studies: None  Treatments: surgery: see above  Discharge Exam: Blood pressure 144/83, pulse 97, temperature 98.9 F (37.2 C), temperature source Oral, resp. rate 16, height 5\' 7"  (1.702 m), weight 138 lb 3 oz (62.681 kg), SpO2 100 %. General appearance: alert, cooperative and no distress Resp: clear to auscultation bilaterally Cardio: regular rate and rhythm, S1, S2 normal, no murmur, click, rub or gallop GI: soft, non-tender; bowel sounds normal; no masses,  no organomegaly and abdomen slightly distended Extremities: extremities normal, atraumatic, no cyanosis or edema Incision/Wound: midline incision with staples open to air, no erythema or drainage Able to move LLE and foot without difficulty.  Ambulating and bearing weight on left foot.  Disposition: 01-Home or Self Care  Discharge  Instructions    Call MD for:  difficulty breathing, headache or visual disturbances    Complete by:  As directed      Call MD for:  extreme fatigue    Complete by:  As directed      Call MD for:  hives    Complete by:  As directed      Call MD for:  persistant dizziness or light-headedness    Complete by:  As directed      Call MD for:  persistant nausea and vomiting    Complete by:  As directed      Call MD for:  redness, tenderness, or signs of infection (pain, swelling, redness, odor or green/yellow discharge around incision site)    Complete by:  As directed      Call MD for:  severe uncontrolled pain    Complete by:  As directed      Call MD for:  temperature >100.4    Complete by:  As directed      Diet - low sodium heart healthy    Complete by:  As directed      Driving Restrictions    Complete by:  As directed   No driving for 2 weeks from surgery.  Do not take narcotics and drive.     Increase activity slowly    Complete by:  As directed      Lifting restrictions    Complete by:  As directed   No lifting greater than 10 lbs.     Sexual Activity Restrictions    Complete by:  As directed   No sexual activity, nothing in the vagina, for 6 weeks.  Medication List    STOP taking these medications        oxyCODONE 5 MG immediate release tablet  Commonly known as:  Oxy IR/ROXICODONE      TAKE these medications        DSS 100 MG Caps  Take 100 mg by mouth 2 (two) times daily as needed for mild constipation.     enoxaparin 40 MG/0.4ML injection  Commonly known as:  LOVENOX  Inject 0.4 mLs (40 mg total) into the skin daily.     ferrous sulfate 325 (65 FE) MG tablet  Take 1 tablet (325 mg total) by mouth 2 (two) times daily with a meal.     gabapentin 100 MG capsule  Commonly known as:  NEURONTIN  Take 1 capsule (100 mg total) by mouth 2 (two) times daily.     hydrochlorothiazide 25 MG tablet  Commonly known as:  HYDRODIURIL  Take 25 mg by mouth  every morning.     HYDROcodone-acetaminophen 5-325 MG per tablet  Commonly known as:  NORCO/VICODIN  Take 1 tablet by mouth every 4 (four) hours as needed for moderate pain or severe pain.     ibuprofen 800 MG tablet  Commonly known as:  ADVIL,MOTRIN  Take 800 mg by mouth every 8 (eight) hours as needed for mild pain.     methocarbamol 500 MG tablet  Commonly known as:  ROBAXIN  Take 500 mg by mouth every 8 (eight) hours as needed for muscle spasms.     mirtazapine 15 MG tablet  Commonly known as:  REMERON  Take 1 tablet (15 mg total) by mouth at bedtime.     ondansetron 8 MG tablet  Commonly known as:  ZOFRAN  Take 1 tablet (8 mg total) by mouth every 8 (eight) hours as needed for nausea or vomiting.     ranitidine 150 MG tablet  Commonly known as:  ZANTAC  Take 150 mg by mouth 2 (two) times daily as needed for heartburn.           Follow-up Information    Follow up with Catherine Eva, MD On 01/12/2015.   Specialty:  Obstetrics and Gynecology   Why:  at 11:45am at the [facility] information:   [place] [address] [phone]       Greater than thirty minutes were spend for face to face discharge instructions and discharge orders/summary in EPIC.   Signed: [name] DEAL 12/29/2014, 4:47 PM

## 2014-12-29 NOTE — Clinical Social Work Note (Signed)
CSW stopped by to speak to patient about ?SNF per MD consult/order. Patient is standing up at sink with Student RN and states she does not need or want SNF. "I am fine"- she then began to walk down hall with student RN using a walker and walking quite fast in an attempt to show how well she is ambulating- Advertising copywriter and CSW encouraged her to slow down and reassured her we would not make her go to a SNF and that she was observed as moving well enough to go home. CSW will advise RNCM of above and to assist with possible HH/DME needs. CSW will sign off-    Eduard Clos, MSW, [place]

## 2014-12-29 NOTE — Progress Notes (Signed)
Physical Therapy Treatment Patient Details Name: Catherine Rogers MRN: [id number] DOB: 07/30/1961 Today's Date: 12/29/2014    History of Present Illness 54 yo female admitted 12/26/14 for  Diagnostic laparoscopy, exploratory laparotomy with right salpingo-oophorectomy, pelvic and para-aortic lymph node dissection, omentectomy for ovarian cancer . onset of LLE pain and sensory changes, negative for DVT by Doppler.    PT Comments    Pt's l LE pain is much improved and she is excited to be able to walk and move around.  Assisted with amb in hallway with and without RW.  Pt much preferred the RW for increased support and decrease pressure on her ABD.   Consulted with OT and agree pt no longer needs CIR and can be safely D/C to home with initial assist and RW.  Will also update LPT comcerning pt's progress.  Follow Up Recommendations  Home health PT     Equipment Recommendations  Rolling walker with 5" wheels    Recommendations for Other Services       Precautions / Restrictions Precautions Precautions: Fall Precaution Comments: L LE weakness > R Restrictions Weight Bearing Restrictions: No    Mobility  Bed Mobility       Sidelying to sit: Supervision     Sit to sidelying: Supervision General bed mobility comments: pt OOB in recliner  Transfers Overall transfer level: Needs assistance Equipment used: Rolling walker (2 wheeled) Transfers: Sit to/from Stand Sit to Stand: Supervision         General transfer comment: <25% Vc's for safety as pt is impulsive  Ambulation/Gait Ambulation/Gait assistance: Supervision;Min guard Ambulation Distance (Feet): 300 Feet Assistive device: Rolling walker (2 wheeled) Gait Pattern/deviations: Step-to pattern;Step-through pattern;Decreased stride length;Trunk flexed Gait velocity: decreased but functional   General Gait Details: amb with RW and without.  Pt preferred walker for increased support, increased safety and it is "less  tiring".  pt demon increased ability to WB thru L LE and stated her pain is much improved.  Still mild c/o foot "feeling numb at times".   Stairs            Wheelchair Mobility    Modified Rankin (Stroke Patients Only)       Balance                                    Cognition Arousal/Alertness: Awake/alert Behavior During Therapy: WFL for tasks assessed/performed Overall Cognitive Status: Within Functional Limits for tasks assessed                      Exercises      General Comments        Pertinent Vitals/Pain Pain Assessment: No/denies pain    Home Living                      Prior Function            PT Goals (current goals can now be found in the care plan section) Progress towards PT goals: Progressing toward goals    Frequency       PT Plan      Co-evaluation             End of Session Equipment Utilized During Treatment: Gait belt Activity Tolerance: Patient tolerated treatment well Patient left: in chair;with call bell/phone within reach     Time: 1411-1420 PT Time Calculation (min) (ACUTE ONLY): 9  min  Charges:  $Gait Training: 8-22 mins                    G Codes:      Rica Koyanagi  PTA [facility]  Acute  Rehab Pager      [phone]

## 2014-12-29 NOTE — Progress Notes (Signed)
Occupational Therapy Treatment Patient Details Name: Catherine Rogers MRN: [id number] DOB: 06-Apr-1961 Today's Date: 12/29/2014    History of present illness 54 yo female admitted 12/26/14 for  Diagnostic laparoscopy, exploratory laparotomy with right salpingo-oophorectomy, pelvic and para-aortic lymph node dissection, omentectomy for ovarian cancer . onset of LLE pain and sensory changes, negative for DVT by Doppler.   OT comments  Pt doing much better this pm than this morning's tx.  She continues to need cues for safety.  Recommend 24/7 for safety, and the DME listed below.    Follow Up Recommendations  Home health OT;Supervision/Assistance - 24 hour    Equipment Recommendations  3 in 1 bedside comode;Tub/shower bench    Recommendations for Other Services      Precautions / Restrictions Precautions Precautions: Fall Restrictions Weight Bearing Restrictions: No       Mobility Bed Mobility       Sidelying to sit: Supervision     Sit to sidelying: Supervision General bed mobility comments: extra time and cues for technique. Pt initially used bedrail to roll--cued not to use it to get OOB, as she doesn't have this at home  Transfers     Transfers: Sit to/from Stand Sit to Stand: Supervision         General transfer comment: cues for safety: pt tends to move quickly    Balance                                   ADL               Lower Body Bathing: Supervison/ safety;Sit to/from stand       Lower Body Dressing: Supervision/safety;Sit to/from stand   Toilet Transfer: Supervision/safety;Ambulation;BSC       Tub/ Shower Transfer: Minimal assistance     General ADL Comments: noted that pt may leave today:  returned for pm session.  Pt able to cross legs for ADLs.  Provided reacher to retrieve items for increased safety.  Pt was not able to step over simulated tub ledge.  Brought tub transfer bench, and recommended this to her.  Also recommend  3;1 for increased safety and independence with toilet transfers.      Vision                     Perception     Praxis      Cognition   Behavior During Therapy: WFL for tasks assessed/performed Overall Cognitive Status: Within Functional Limits for tasks assessed                       Extremity/Trunk Assessment               Exercises     Shoulder Instructions       General Comments      Pertinent Vitals/ Pain       Pain Assessment: No/denies pain  Home Living                                          Prior Functioning/Environment              Frequency Min 2X/week     Progress Toward Goals  OT Goals(current goals can now be found in the care plan section)  Progress towards OT  goals:  (goals met; pt may d/c today.  If pt remains, will upgrade go)     Plan Discharge plan needs to be updated    Co-evaluation                 End of Session     Activity Tolerance Patient tolerated treatment well   Patient Left  (in bathroom with family:  notified NT)   Nurse Communication          Time: 1443-1540 and [phone] OT Time Calculation (min): 14 min  Charges: OT General Charges $OT Visit: 1 Procedure OT Treatments $Self Care/Home Management : 8-22 mins  Camora Tremain 12/29/2014, 3:06 PM   Lesle Chris, OTR/L [phone] 12/29/2014

## 2015-01-01 ENCOUNTER — Ambulatory Visit: Payer: Medicare Other | Admitting: Family

## 2015-01-02 DIAGNOSIS — F419 Anxiety disorder, unspecified: Secondary | ICD-10-CM | POA: Diagnosis not present

## 2015-01-02 DIAGNOSIS — F329 Major depressive disorder, single episode, unspecified: Secondary | ICD-10-CM | POA: Diagnosis not present

## 2015-01-02 DIAGNOSIS — I10 Essential (primary) hypertension: Secondary | ICD-10-CM | POA: Diagnosis not present

## 2015-01-02 DIAGNOSIS — C562 Malignant neoplasm of left ovary: Secondary | ICD-10-CM | POA: Diagnosis not present

## 2015-01-02 DIAGNOSIS — J45909 Unspecified asthma, uncomplicated: Secondary | ICD-10-CM | POA: Diagnosis not present

## 2015-01-02 DIAGNOSIS — Z483 Aftercare following surgery for neoplasm: Secondary | ICD-10-CM | POA: Diagnosis not present

## 2015-01-02 DIAGNOSIS — M47896 Other spondylosis, lumbar region: Secondary | ICD-10-CM | POA: Diagnosis not present

## 2015-01-02 DIAGNOSIS — C189 Malignant neoplasm of colon, unspecified: Secondary | ICD-10-CM | POA: Diagnosis not present

## 2015-01-02 DIAGNOSIS — K219 Gastro-esophageal reflux disease without esophagitis: Secondary | ICD-10-CM | POA: Diagnosis not present

## 2015-01-03 ENCOUNTER — Other Ambulatory Visit: Payer: Medicare Other

## 2015-01-03 ENCOUNTER — Telehealth: Payer: Self-pay | Admitting: *Deleted

## 2015-01-03 ENCOUNTER — Other Ambulatory Visit: Payer: Self-pay | Admitting: *Deleted

## 2015-01-03 ENCOUNTER — Ambulatory Visit: Payer: Medicare Other | Admitting: Hematology

## 2015-01-03 NOTE — Telephone Encounter (Signed)
Pt was No Show today 01/03/15.  Called pt at home and left message on voice mail  requesting pt to call office back for rescheduled appts.   Onc tx sent to scheduler.

## 2015-01-04 ENCOUNTER — Telehealth: Payer: Self-pay

## 2015-01-04 ENCOUNTER — Telehealth: Payer: Self-pay | Admitting: Hematology

## 2015-01-04 ENCOUNTER — Telehealth: Payer: Self-pay | Admitting: *Deleted

## 2015-01-04 DIAGNOSIS — I10 Essential (primary) hypertension: Secondary | ICD-10-CM | POA: Diagnosis not present

## 2015-01-04 DIAGNOSIS — J45909 Unspecified asthma, uncomplicated: Secondary | ICD-10-CM | POA: Diagnosis not present

## 2015-01-04 DIAGNOSIS — C189 Malignant neoplasm of colon, unspecified: Secondary | ICD-10-CM | POA: Diagnosis not present

## 2015-01-04 DIAGNOSIS — Z483 Aftercare following surgery for neoplasm: Secondary | ICD-10-CM | POA: Diagnosis not present

## 2015-01-04 DIAGNOSIS — M47896 Other spondylosis, lumbar region: Secondary | ICD-10-CM | POA: Diagnosis not present

## 2015-01-04 DIAGNOSIS — C562 Malignant neoplasm of left ovary: Secondary | ICD-10-CM | POA: Diagnosis not present

## 2015-01-04 NOTE — Telephone Encounter (Signed)
per pof to sch pt-cld & left pt a message of time & date of appt

## 2015-01-04 NOTE — Telephone Encounter (Signed)
Pt called stating she needed to make an appt. Saw she no showed yesterday and was rescheduled to 2/25. Returned her call and lvm with this schedule.

## 2015-01-04 NOTE — Telephone Encounter (Signed)
Call to Dr. Doy Mince office Neurology to schedule pt appt,  Pt previously left hospital after surgery prior to MD in-patient consult was completed. LM at MD office requesting an appt for pt and call back. Call to pt to discuss a follow up visit with Dr. Doy Mince Neurology. Unable to reach pt. lmovm for pt with this information and Dr. Doy Mince office number in the event she needs to contact them. Request for pt to call our office regarding message left.

## 2015-01-04 NOTE — Telephone Encounter (Signed)
lvm for pt regarding added appts on 2.25....mailed pt appt sched/avs adn letter

## 2015-01-05 ENCOUNTER — Ambulatory Visit (INDEPENDENT_AMBULATORY_CARE_PROVIDER_SITE_OTHER): Payer: Medicare Other | Admitting: Family

## 2015-01-05 ENCOUNTER — Encounter: Payer: Self-pay | Admitting: Family

## 2015-01-05 VITALS — BP 150/100 | HR 111 | Temp 99.6°F | Resp 20 | Ht 67.0 in | Wt 134.1 lb

## 2015-01-05 DIAGNOSIS — C562 Malignant neoplasm of left ovary: Secondary | ICD-10-CM | POA: Diagnosis not present

## 2015-01-05 DIAGNOSIS — R208 Other disturbances of skin sensation: Secondary | ICD-10-CM | POA: Diagnosis not present

## 2015-01-05 DIAGNOSIS — R200 Anesthesia of skin: Secondary | ICD-10-CM | POA: Insufficient documentation

## 2015-01-05 NOTE — Patient Instructions (Addendum)
Thank you for choosing [facility].  Summary/Instructions:  Referral will be made to [facility] for shower chair, reacher, bedside commode and nursing assistant.   If your symptoms worsen or fail to improve, please contact our office for further instruction, or in case of emergency go directly to the emergency room at the closest medical facility.

## 2015-01-05 NOTE — Progress Notes (Signed)
Pre visit review using our clinic review tool, if applicable. No additional management support is needed unless otherwise documented below in the visit note. 

## 2015-01-05 NOTE — Assessment & Plan Note (Signed)
Total hysterectomy performed last week. Wound site appears clean and dry with no evidence of infection. Continue management per gynecology-oncology.

## 2015-01-05 NOTE — Assessment & Plan Note (Addendum)
Idiopathic left leg numbness presenting with weakness. Patient has scheduled appointment with neurology for further evaluation. Continue Vicodin as needed and previously prescribed for pain. Obtain shower chair and grasper per patient request to assist in decreasing falls. Continue to work with physical therapy and use of walker.

## 2015-01-05 NOTE — Progress Notes (Signed)
Subjective:    Patient ID: Catherine Rogers, female    DOB: 1961-07-23, 54 y.o.   MRN: [id number]  Chief Complaint  Patient presents with  . Establish Care    would like to set up advanced homecare, unable to get herself dressed in a timely fashion, takes 2 hours    HPI:  Catherine Rogers is a 54 y.o. female who presents today to establish care and discuss need for home care.   Indicates in October 2015 she had surgery for colon cancer and last week completed surgery for a total hysterectomy. She is indicating associated symptoms of left leg numbness and pain that increased with the most recent surgery has been going on for a significant period of time. Describes the pain as sharp on occasion with mainly numbness in her leg. Also notes pain in her abdomen from her surgical status incision. Pain intensity is rated a 9-10/10 fairly constantly.   Patient would like to discuss possibility of home health care as she expresses difficulties with activities of daily living and instrumental activities of daily living. She is currently working with physical therapy, however believe she is in need of more assistance. States she has difficulties standing for long periods of time secondary to pain which is rated 9-10/10 and difficulty with balance making it difficult to shower, prepare food and move around the house. She is currently using a walker which does help, however she notes she has fallen several times. She also notes becoming fatigued and pain limits her ability to dress herself. Notes it took her an hour and a half this morning to dress herself. States she cannot walk more than 10 steps without having pain. Pain is currently managed with Vicodin. She is unable to do laundry as her laundry machine is downstairs and she does not have the ability to go down the steps.  Allergies  Allergen Reactions  . Other Hives and Itching    Highly acidic foods "make me itch and break out" oranges, tomatoes  .  Demerol [Meperidine] Nausea And Vomiting  . Ibuprofen Nausea And Vomiting  . Morphine And Related Nausea And Vomiting  . Penicillins Other (See Comments)    Childhood allergy  . Tylenol [Acetaminophen] Nausea And Vomiting    Current Outpatient Prescriptions on File Prior to Visit  Medication Sig Dispense Refill  . docusate sodium 100 MG CAPS Take 100 mg by mouth 2 (two) times daily as needed for mild constipation. 10 capsule 0  . enoxaparin (LOVENOX) 40 MG/0.4ML injection Inject 0.4 mLs (40 mg total) into the skin daily. 25 Syringe 0  . ferrous sulfate 325 (65 FE) MG tablet Take 1 tablet (325 mg total) by mouth 2 (two) times daily with a meal. 60 tablet 3  . gabapentin (NEURONTIN) 100 MG capsule Take 1 capsule (100 mg total) by mouth 2 (two) times daily. 60 capsule 1  . hydrochlorothiazide (HYDRODIURIL) 25 MG tablet Take 25 mg by mouth every morning.     [name] HYDROcodone-acetaminophen (NORCO/VICODIN) 5-325 MG per tablet Take 1 tablet by mouth every 4 (four) hours as needed for moderate pain or severe pain. 45 tablet 0  . methocarbamol (ROBAXIN) 500 MG tablet Take 500 mg by mouth every 8 (eight) hours as needed for muscle spasms.     . ondansetron (ZOFRAN) 8 MG tablet Take 1 tablet (8 mg total) by mouth every 8 (eight) hours as needed for nausea or vomiting. 20 tablet 3  . ranitidine (ZANTAC) 150 MG tablet Take 150  mg by mouth 2 (two) times daily as needed for heartburn.      No current facility-administered medications on file prior to visit.    Past Medical History  Diagnosis Date  . Hypertension   . Ectopic pregnancy   . Head injury, closed, with brief LOC     had a cut- as a child  . Asthma   . Anxiety   . Depression   . PTSD (post-traumatic stress disorder)   . GERD (gastroesophageal reflux disease)   . Arthritis   . Pelvic cyst   . Complication of anesthesia     woke and saw long tube coming out of mouth . sat up then went back to sleep  . PONV (postoperative nausea and  vomiting)   . Family history of adverse reaction to anesthesia     sister on dialysis- hypotension   . Headache   . Cancer   . Anemia   . History of blood transfusion     2005 approximately     Past Surgical History  Procedure Laterality Date  . Esophagogastroduodenoscopy N/A 09/17/2014    Procedure: ESOPHAGOGASTRODUODENOSCOPY (EGD);  Surgeon: Jeryl Columbia, MD;  Location: [facility] ENDOSCOPY;  Service: Endoscopy;  Laterality: N/A;  . Abdominal hysterectomy    . Tubal ligation    . Partial colectomy Right 09/18/2014    Procedure: RIGHT HEMI COLECTOMY;  Surgeon: Doreen Salvage, MD;  Location: [place];  Service: General;  Laterality: Right;  . Ileostomy Right 09/18/2014    Procedure: ILEOCOLOSTOMY;  Surgeon: Doreen Salvage, MD;  Location: [place];  Service: General;  Laterality: Right;  . Laparotomy N/A 11/01/2014    Procedure: EXPLORATORY LAPAROTOMY;  Surgeon: Doreen Salvage, MD;  Location: [place];  Service: General;  Laterality: N/A;  . Mass excision  11/01/2014    Procedure: EXCISION PELVIC MASS;  Surgeon: Doreen Salvage, MD;  Location: [place];  Service: General;;  . Robotic assisted total hysterectomy with bilateral salpingo oopherectomy Right 12/26/2014    Procedure: EXPLORATORY LAPAROSCOPY  RIGHT SALPINGO OOPHORECTOMY OMENTECTOMY LYMPHADECTOMY ;  Surgeon: Everitt Amber, MD;  Location: [facility];  Service: Gynecology;  Laterality: Right;  . Laparotomy N/A 12/26/2014    Procedure:  LAPAROTOMY ;  Surgeon: Everitt Amber, MD;  Location: [facility];  Service: Gynecology;  Laterality: N/A;    Family History  Problem Relation Age of Onset  . Cancer Sister 56    brain cancer   . Cancer Brother 25    lung cancer   . Heart attack Mother   . Heart attack Father     40s    History   Social History  . Marital Status: Widowed    Spouse Name: N/A  . Number of Children: 6  . Years of Education: 13   Occupational History  . Disability    Social History Main Topics  . Smoking status: Never Smoker   . Smokeless tobacco: Never Used      Comment: pack per month of cigars   . Alcohol Use: 3.5 oz/week    7 Standard drinks or equivalent per week     Comment: champagne socially   . Drug Use: Yes    Special: Marijuana     Comment: smokes marijuana every 2-3 days   . Sexual Activity: No   Other Topics Concern  . Not on file   Social History Narrative   Born and raised in [place], [place]. Currently resides in a house by herself. No pets. Fun: Read, shoot pool.  Denies religious beliefs that would effect health care.     Review of Systems  Skin: Positive for wound.  Neurological: Positive for numbness.      Objective:    BP 150/100 mmHg  Pulse 111  Temp(Src) 99.6 F (37.6 C) (Oral)  Resp 20  Ht 5\' 7"  (1.702 m)  Wt 134 lb 1.9 oz (60.836 kg)  BMI 21.00 kg/m2  SpO2 99% Nursing note and vital signs reviewed.  Pain seated - 5/10 with medication Pain with ambulation 9-10/10 Able to ambulate at least 30 feet without walker with minimal gait change.   Physical Exam  Constitutional: She is oriented to person, place, and time. She appears well-developed and well-nourished. No distress.  Cardiovascular: Normal rate, regular rhythm, normal heart sounds and intact distal pulses.   Pulmonary/Chest: Effort normal and breath sounds normal.  Abdominal:  Abdominal surgical incision is clean dry and intact with staples. No evidence of infection noted.  Musculoskeletal:  Generalized upper extremity strength is 4+/5 in all directions and displayed in full range of motion in all directions.  Right sided lower extremity displays 4+/5 strength in all directions and has full range of motion.  Left lower extremity: Strength is 2+ out of 5 with very limited range of motion in all directions.  Neurological: She is alert and oriented to person, place, and time.  Patient is able to discriminate touch in her left lower extremity with decreased sharp/dull sensation  Skin: Skin is warm and dry.  Psychiatric: She has a normal  mood and affect. Her behavior is normal. Judgment and thought content normal.       Assessment & Plan:

## 2015-01-08 ENCOUNTER — Ambulatory Visit: Payer: Medicare Other | Admitting: Gynecologic Oncology

## 2015-01-10 DIAGNOSIS — C189 Malignant neoplasm of colon, unspecified: Secondary | ICD-10-CM | POA: Diagnosis not present

## 2015-01-10 DIAGNOSIS — M47896 Other spondylosis, lumbar region: Secondary | ICD-10-CM | POA: Diagnosis not present

## 2015-01-10 DIAGNOSIS — I10 Essential (primary) hypertension: Secondary | ICD-10-CM | POA: Diagnosis not present

## 2015-01-10 DIAGNOSIS — Z483 Aftercare following surgery for neoplasm: Secondary | ICD-10-CM | POA: Diagnosis not present

## 2015-01-10 DIAGNOSIS — J45909 Unspecified asthma, uncomplicated: Secondary | ICD-10-CM | POA: Diagnosis not present

## 2015-01-10 DIAGNOSIS — C562 Malignant neoplasm of left ovary: Secondary | ICD-10-CM | POA: Diagnosis not present

## 2015-01-11 ENCOUNTER — Other Ambulatory Visit: Payer: Self-pay | Admitting: Gynecologic Oncology

## 2015-01-11 ENCOUNTER — Telehealth: Payer: Self-pay

## 2015-01-11 ENCOUNTER — Emergency Department (HOSPITAL_COMMUNITY)
Admission: EM | Admit: 2015-01-11 | Discharge: 2015-01-11 | Disposition: A | Discharge status: Home / Self Care | Payer: Medicare Other | Attending: Emergency Medicine | Admitting: Emergency Medicine

## 2015-01-11 ENCOUNTER — Encounter (HOSPITAL_COMMUNITY): Payer: Self-pay | Admitting: *Deleted

## 2015-01-11 ENCOUNTER — Emergency Department (HOSPITAL_COMMUNITY): Payer: Medicare Other

## 2015-01-11 DIAGNOSIS — Z9889 Other specified postprocedural states: Secondary | ICD-10-CM | POA: Insufficient documentation

## 2015-01-11 DIAGNOSIS — Z859 Personal history of malignant neoplasm, unspecified: Secondary | ICD-10-CM | POA: Insufficient documentation

## 2015-01-11 DIAGNOSIS — Z880 Allergy status to penicillin: Secondary | ICD-10-CM | POA: Insufficient documentation

## 2015-01-11 DIAGNOSIS — I10 Essential (primary) hypertension: Secondary | ICD-10-CM | POA: Diagnosis not present

## 2015-01-11 DIAGNOSIS — C562 Malignant neoplasm of left ovary: Secondary | ICD-10-CM | POA: Diagnosis not present

## 2015-01-11 DIAGNOSIS — IMO0001 Reserved for inherently not codable concepts without codable children: Secondary | ICD-10-CM

## 2015-01-11 DIAGNOSIS — Y838 Other surgical procedures as the cause of abnormal reaction of the patient, or of later complication, without mention of misadventure at the time of the procedure: Secondary | ICD-10-CM | POA: Insufficient documentation

## 2015-01-11 DIAGNOSIS — M1990 Unspecified osteoarthritis, unspecified site: Secondary | ICD-10-CM | POA: Insufficient documentation

## 2015-01-11 DIAGNOSIS — Z483 Aftercare following surgery for neoplasm: Secondary | ICD-10-CM | POA: Diagnosis not present

## 2015-01-11 DIAGNOSIS — Z90710 Acquired absence of both cervix and uterus: Secondary | ICD-10-CM | POA: Insufficient documentation

## 2015-01-11 DIAGNOSIS — J45909 Unspecified asthma, uncomplicated: Secondary | ICD-10-CM | POA: Diagnosis not present

## 2015-01-11 DIAGNOSIS — Z9851 Tubal ligation status: Secondary | ICD-10-CM | POA: Insufficient documentation

## 2015-01-11 DIAGNOSIS — R188 Other ascites: Secondary | ICD-10-CM

## 2015-01-11 DIAGNOSIS — Z8742 Personal history of other diseases of the female genital tract: Secondary | ICD-10-CM | POA: Insufficient documentation

## 2015-01-11 DIAGNOSIS — Z79899 Other long term (current) drug therapy: Secondary | ICD-10-CM | POA: Insufficient documentation

## 2015-01-11 DIAGNOSIS — D649 Anemia, unspecified: Secondary | ICD-10-CM | POA: Insufficient documentation

## 2015-01-11 DIAGNOSIS — C189 Malignant neoplasm of colon, unspecified: Secondary | ICD-10-CM | POA: Diagnosis not present

## 2015-01-11 DIAGNOSIS — R1084 Generalized abdominal pain: Secondary | ICD-10-CM | POA: Diagnosis not present

## 2015-01-11 DIAGNOSIS — K219 Gastro-esophageal reflux disease without esophagitis: Secondary | ICD-10-CM | POA: Diagnosis not present

## 2015-01-11 DIAGNOSIS — F329 Major depressive disorder, single episode, unspecified: Secondary | ICD-10-CM | POA: Diagnosis not present

## 2015-01-11 DIAGNOSIS — M47896 Other spondylosis, lumbar region: Secondary | ICD-10-CM | POA: Diagnosis not present

## 2015-01-11 DIAGNOSIS — R1030 Lower abdominal pain, unspecified: Secondary | ICD-10-CM | POA: Insufficient documentation

## 2015-01-11 DIAGNOSIS — F419 Anxiety disorder, unspecified: Secondary | ICD-10-CM | POA: Diagnosis not present

## 2015-01-11 DIAGNOSIS — T814XXA Infection following a procedure, initial encounter: Secondary | ICD-10-CM | POA: Insufficient documentation

## 2015-01-11 DIAGNOSIS — K651 Peritoneal abscess: Secondary | ICD-10-CM

## 2015-01-11 DIAGNOSIS — I700 Atherosclerosis of aorta: Secondary | ICD-10-CM | POA: Diagnosis not present

## 2015-01-11 LAB — CBC WITH DIFFERENTIAL/PLATELET
Basophils Absolute: 0 10*3/uL (ref 0.0–0.1)
Basophils Relative: 0 % (ref 0–1)
Eosinophils Absolute: 0.1 10*3/uL (ref 0.0–0.7)
Eosinophils Relative: 1 % (ref 0–5)
HCT: 27.2 % — ABNORMAL LOW (ref 36.0–46.0)
Hemoglobin: 9.3 g/dL — ABNORMAL LOW (ref 12.0–15.0)
Lymphocytes Relative: 31 % (ref 12–46)
Lymphs Abs: 1.8 10*3/uL (ref 0.7–4.0)
MCH: 33.6 pg (ref 26.0–34.0)
MCHC: 34.2 g/dL (ref 30.0–36.0)
MCV: 98.2 fL (ref 78.0–100.0)
Monocytes Absolute: 0.6 10*3/uL (ref 0.1–1.0)
Monocytes Relative: 11 % (ref 3–12)
Neutro Abs: 3.3 10*3/uL (ref 1.7–7.7)
Neutrophils Relative %: 57 % (ref 43–77)
Platelets: 330 10*3/uL (ref 150–400)
RBC: 2.77 MIL/uL — ABNORMAL LOW (ref 3.87–5.11)
RDW: 16.7 % — ABNORMAL HIGH (ref 11.5–15.5)
WBC: 5.8 10*3/uL (ref 4.0–10.5)

## 2015-01-11 LAB — BASIC METABOLIC PANEL
Anion gap: 11 (ref 5–15)
BUN: 17 mg/dL (ref 6–23)
CALCIUM: 9.6 mg/dL (ref 8.4–10.5)
CO2: 25 mmol/L (ref 19–32)
CREATININE: 0.66 mg/dL (ref 0.50–1.10)
Chloride: 102 mmol/L (ref 96–112)
Glucose, Bld: 100 mg/dL — ABNORMAL HIGH (ref 70–99)
Potassium: 3.3 mmol/L — ABNORMAL LOW (ref 3.5–5.1)
Sodium: 138 mmol/L (ref 135–145)

## 2015-01-11 LAB — I-STAT CG4 LACTIC ACID, ED: Lactic Acid, Venous: 0.89 mmol/L (ref 0.5–2.0)

## 2015-01-11 MED ORDER — HYDROCODONE-ACETAMINOPHEN 5-325 MG PO TABS: 1.0000 | ORAL_TABLET | ORAL | Status: DC | PRN

## 2015-01-11 MED ORDER — MORPHINE SULFATE 4 MG/ML IJ SOLN
4.0000 mg | Freq: Once | INTRAMUSCULAR | Status: AC
Start: 2015-01-11 — End: 2015-01-11
  Administered 2015-01-11: 4 mg via INTRAVENOUS
  Filled 2015-01-11: qty 1

## 2015-01-11 MED ORDER — VANCOMYCIN HCL IN DEXTROSE 1-5 GM/200ML-% IV SOLN: 1000.0000 mg | Freq: Once | INTRAVENOUS | Status: DC

## 2015-01-11 MED ORDER — IOHEXOL 300 MG/ML  SOLN
100.0000 mL | Freq: Once | INTRAMUSCULAR | Status: AC | PRN
  Administered 2015-01-11: 100 mL via INTRAVENOUS

## 2015-01-11 MED ORDER — SODIUM CHLORIDE 0.9 % IV BOLUS (SEPSIS)
1000.0000 mL | Freq: Once | INTRAVENOUS | Status: AC
  Administered 2015-01-11: 1000 mL via INTRAVENOUS

## 2015-01-11 MED ORDER — IOHEXOL 300 MG/ML  SOLN
50.0000 mL | Freq: Once | INTRAMUSCULAR | Status: AC | PRN
  Administered 2015-01-11: 50 mL via ORAL

## 2015-01-11 NOTE — Telephone Encounter (Signed)
Pts PT called asking for advice on what to do with pt who c/o severe abdominal pain, weakness, fever, and just not feeling well. Pt just had ovarian surgery a couple weeks ago to remove cancer. The PT wanted to see if we would give the ok to ask one of the Home health nurses to come out to take a look at her or just call EMS and send her straight to the hospital. I advised that Catherine Rogers was in a room seeing a pt but that if she was in sever pain as he says then the pt probably needed to go to the hospital bc there is no way of telling whether she has an infection somewhere with the fever and the severe pain. PT stated he was going to go ahead and call EMS. No further action was needed.

## 2015-01-11 NOTE — ED Notes (Signed)
Pt comes in to the hospital from home. Pt recently had a surgery on 12/26/14 for a bowel resection and ovary removal. Since this morning she began having drainage coming from incision site. NAD noted at this time. Pt is alert and oriented x4

## 2015-01-11 NOTE — ED Provider Notes (Signed)
CSN: [id number]     Arrival date & time 01/11/15  1040 History  This chart was scribed for Catherine Hamburger, MD by Edison Simon, ED Scribe. This patient was seen in room APA19/APA19 and the patient's care was started at 11:02 AM.    Chief Complaint  Patient presents with  . Drainage from Incision   The history is provided by the patient. No language interpreter was used.    HPI Comments: Catherine Rogers is a 54 y.o. female who presents to the Emergency Department complaining of drainage from incision site with onset at 0235 this morning. She states she had bowel resection and ovary removed on 12/26/2014 to treat ovarian cancer. She notes her abdomen began to hurt yesterday after physical therapy. She reports associated fever measured by her physical therapist today, though she is unable to specify the temperature ("high"). She describes the drainage as blood with another color in it "like snot." She states she has appointment with surgeon's office tomorrow to remove staples.   Past Medical History  Diagnosis Date  . Hypertension   . Ectopic pregnancy   . Head injury, closed, with brief LOC     had a cut- as a child  . Asthma   . Anxiety   . Depression   . PTSD (post-traumatic stress disorder)   . GERD (gastroesophageal reflux disease)   . Arthritis   . Pelvic cyst   . Complication of anesthesia     woke and saw long tube coming out of mouth . sat up then went back to sleep  . PONV (postoperative nausea and vomiting)   . Family history of adverse reaction to anesthesia     sister on dialysis- hypotension   . Headache   . Cancer   . Anemia   . History of blood transfusion     2005 approximately    Past Surgical History  Procedure Laterality Date  . Esophagogastroduodenoscopy N/A 09/17/2014    Procedure: ESOPHAGOGASTRODUODENOSCOPY (EGD);  Surgeon: Jeryl Columbia, MD;  Location: [facility] ENDOSCOPY;  Service: Endoscopy;  Laterality: N/A;  . Abdominal hysterectomy    . Tubal ligation    .  Partial colectomy Right 09/18/2014    Procedure: RIGHT HEMI COLECTOMY;  Surgeon: Doreen Salvage, MD;  Location: [place];  Service: General;  Laterality: Right;  . Ileostomy Right 09/18/2014    Procedure: ILEOCOLOSTOMY;  Surgeon: Doreen Salvage, MD;  Location: [place];  Service: General;  Laterality: Right;  . Laparotomy N/A 11/01/2014    Procedure: EXPLORATORY LAPAROTOMY;  Surgeon: Doreen Salvage, MD;  Location: [place];  Service: General;  Laterality: N/A;  . Mass excision  11/01/2014    Procedure: EXCISION PELVIC MASS;  Surgeon: Doreen Salvage, MD;  Location: [place];  Service: General;;  . Robotic assisted total hysterectomy with bilateral salpingo oopherectomy Right 12/26/2014    Procedure: EXPLORATORY LAPAROSCOPY  RIGHT SALPINGO OOPHORECTOMY OMENTECTOMY LYMPHADECTOMY ;  Surgeon: Everitt Amber, MD;  Location: [facility];  Service: Gynecology;  Laterality: Right;  . Laparotomy N/A 12/26/2014    Procedure:  LAPAROTOMY ;  Surgeon: Everitt Amber, MD;  Location: [facility];  Service: Gynecology;  Laterality: N/A;  . Oophorectomy  2016   Family History  Problem Relation Age of Onset  . Cancer Sister 2    brain cancer   . Cancer Brother 15    lung cancer   . Heart attack Mother   . Heart attack Father     50s   History  Substance Use Topics  .  Smoking status: Never Smoker   . Smokeless tobacco: Never Used     Comment: pack per month of cigars   . Alcohol Use: 3.5 oz/week    7 Standard drinks or equivalent per week     Comment: champagne socially    OB History    No data available     Review of Systems  Constitutional: Positive for fever.  Gastrointestinal: Positive for abdominal pain. Negative for vomiting.  Skin: Positive for wound.  All other systems reviewed and are negative.     Allergies  Other; Demerol; Ibuprofen; Morphine and related; Oxycodone; Penicillins; and Tylenol  Home Medications   Prior to Admission medications   Medication Sig Start Date End Date Taking? Authorizing Provider  docusate sodium 100  MG CAPS Take 100 mg by mouth 2 (two) times daily as needed for mild constipation. 09/22/14   Megan N Dort, PA-C  enoxaparin (LOVENOX) 40 MG/0.4ML injection Inject 0.4 mLs (40 mg total) into the skin daily. 12/29/14   Dorothyann Gibbs, NP  ferrous sulfate 325 (65 FE) MG tablet Take 1 tablet (325 mg total) by mouth 2 (two) times daily with a meal. 12/29/14   Melissa D Cross, NP  gabapentin (NEURONTIN) 100 MG capsule Take 1 capsule (100 mg total) by mouth 2 (two) times daily. 12/29/14   Dorothyann Gibbs, NP  hydrochlorothiazide (HYDRODIURIL) 25 MG tablet Take 25 mg by mouth every morning.  09/14/14   Historical Provider, MD  HYDROcodone-acetaminophen (NORCO/VICODIN) 5-325 MG per tablet Take 1 tablet by mouth every 4 (four) hours as needed for moderate pain or severe pain. 12/29/14   Dorothyann Gibbs, NP  methocarbamol (ROBAXIN) 500 MG tablet Take 500 mg by mouth every 8 (eight) hours as needed for muscle spasms.  11/15/14   Historical Provider, MD  ondansetron (ZOFRAN) 8 MG tablet Take 1 tablet (8 mg total) by mouth every 8 (eight) hours as needed for nausea or vomiting. 11/28/14   Truitt Merle, MD  ranitidine (ZANTAC) 150 MG tablet Take 150 mg by mouth 2 (two) times daily as needed for heartburn.  09/11/14   Historical Provider, MD   BP 129/79 mmHg  Pulse 101  Temp(Src) 98.3 F (36.8 C) (Oral)  Resp 16  Ht 5\' 7"  (1.702 m)  Wt 135 lb (61.236 kg)  BMI 21.14 kg/m2  SpO2 100% Physical Exam  Constitutional: She is oriented to person, place, and time. She appears well-developed and well-nourished.  HENT:  Head: Normocephalic and atraumatic.  Eyes: Right eye exhibits no discharge. Left eye exhibits no discharge.  Neck: Neck supple.  Cardiovascular: Normal rate, regular rhythm and normal heart sounds.   No murmur heard. Pulmonary/Chest: Effort normal. No respiratory distress. She has no wheezes. She has no rales.  Abdominal: Soft. She exhibits no distension. There is tenderness.  Large midline surgical wound with  staples No dehiscence  Mild dried drainage just under umbilicus Focal tenderness in lower abdomen worse on right  Neurological: She is alert and oriented to person, place, and time.  Skin: Skin is warm and dry.  Psychiatric: She has a normal mood and affect.  Nursing note and vitals reviewed.   ED Course  Procedures (including critical care time)  DIAGNOSTIC STUDIES: Oxygen Saturation is 100% on room air, normal by my interpretation.    COORDINATION OF CARE: 11:07 AM Discussed treatment plan with patient at beside, including pain medication and CT scan. The patient agrees with the plan and has no further questions at this time.  Labs Review Labs Reviewed  BASIC METABOLIC PANEL - Abnormal; Notable for the following:    Potassium 3.3 (*)    Glucose, Bld 100 (*)    All other components within normal limits  CBC WITH DIFFERENTIAL/PLATELET - Abnormal; Notable for the following:    RBC 2.77 (*)    Hemoglobin 9.3 (*)    HCT 27.2 (*)    RDW 16.7 (*)    All other components within normal limits  I-STAT CG4 LACTIC ACID, ED     Imaging Review Ct Abdomen Pelvis W Contrast  01/11/2015   CLINICAL DATA:  Lower abdominal pain, recent surgery 12/26/2014 bowel resection and hysterectomy, drainage at incision site  EXAM: CT ABDOMEN AND PELVIS WITH CONTRAST  TECHNIQUE: Multidetector CT imaging of the abdomen and pelvis was performed using the standard protocol following bolus administration of intravenous contrast.  CONTRAST:  51mL OMNIPAQUE IOHEXOL 300 MG/ML SOLN, 141mL OMNIPAQUE IOHEXOL 300 MG/ML SOLN  COMPARISON:  12/28/2014  FINDINGS: Lung bases are unremarkable. Sagittal images of the spine shows degenerative changes lumbar spine.  Enhanced liver, pancreas, spleen and adrenal glands are unremarkable. Enhanced kidneys are symmetrical in size. No hydronephrosis or hydroureter. Delayed renal images shows bilateral renal symmetrical excretion. Mild atherosclerotic calcifications of distal  abdominal aorta and iliac arteries.  Bilateral ureter is unremarkable on delayed images. Bilateral distal ureter is patent and contrast is noted entering the urinary bladder.  There is no small bowel obstruction. No free abdominal air is noted. The patient is status post hysterectomy. Again noted midline scar and skin staples are noted lower abdominal and pelvic wall.  There is a loculated fluid collection with elliptical shape in right pelvic side wall with mild wall enhancement. This measures about 6.1 x 2.5 cm. There is a second collection in right anterior mesentery right pelvis anteriorly measures about 3.6 x 3.2 cm. Findings are highly suspicious for infected fluid/pelvic abscess. Clinical correlation is necessary. The right pelvic sidewall sidewall fluid is extending along the right medial acetabulum see axial image 55. There is no evidence of periosteal reaction. No air-fluid level is noted. Mild degenerative changes bilateral hip joints. Small amount of free fluid is noted in left pelvis anteriorly just above the urinary bladder. Tiny loculated fluid collection just posterior to right rectus muscle right pelvis see axial image 54 measures 1.3 cm suspicious for infected fluid.  IMPRESSION: 1. There are postsurgical changes post hysterectomy. Midline lower abdominal and pelvic wall scarring and skin staples are noted. There is a elongated loculated fluid collection in right pelvic sidewall which is extending along the medial aspect of iliac bone/right acetabulum. The collection measures at least 6.1 x 2.5 cm. This is highly suspicious for infected fluid or early pelvic abscess. A second loculated fluid collection in right anterior pelvis/ mesentery axial image 49 on delayed images Measures 3.6 x 3.2 cm. Small amount of complex fluid is noted in right pelvis anteriorly just above the urinary bladder. 2. Bilateral renal symmetrical excretion. Unremarkable bilateral ureter. Bilateral distal ureter is patent on  delayed images and there is excretion into urinary bladder which appears intact. 3. No small bowel or colonic obstruction.  No free abdominal air. These results were called by telephone at the time of interpretation on 01/11/2015 at 2:19 pm to Dr. Sherwood Gambler , who verbally acknowledged these results.   Electronically Signed   By: Lahoma Crocker M.D.   On: 01/11/2015 14:19     EKG Interpretation None      MDM  Final diagnoses:  Postoperative intra-abdominal abscess, initial encounter    Patient with intra-abdominal abscesses as above from her recent gyn-onc surgery. Discussed with her surgeon, Dr. Denman George, who recommends discharge with pain meds (patient is out) and follow up in her office as scheduled tomorrow. She prefers discharge given no fever here, no elevated WBC and well appearance of patient. Pain is controlled. Wants to hold on antibiotics as she is setting up IR drainage for tomorrow.  I personally performed the services described in this documentation, which was scribed in my presence. The recorded information has been reviewed and is accurate.   Catherine Hamburger, MD 01/11/15 [phone]

## 2015-01-11 NOTE — Discharge Instructions (Signed)
You have an abscess inside your abdomen from your surgery. Dr. Denman George knows about this and will see you tomorrow (01/12/15) for staple removal and to set up drainage of the infection. If you develop fever, worse pain or other concerning symptoms return to the ER or call Dr. Denman George   Abscess An abscess is an infected area that contains a collection of pus and debris.It can occur in almost any part of the body. An abscess is also known as a furuncle or boil. CAUSES  An abscess occurs when tissue gets infected. This can occur from blockage of oil or sweat glands, infection of hair follicles, or a minor injury to the skin. As the body tries to fight the infection, pus collects in the area and creates pressure under the skin. This pressure causes pain. People with weakened immune systems have difficulty fighting infections and get certain abscesses more often.  SYMPTOMS Usually an abscess develops on the skin and becomes a painful mass that is red, warm, and tender. If the abscess forms under the skin, you may feel a moveable soft area under the skin. Some abscesses break open (rupture) on their own, but most will continue to get worse without care. The infection can spread deeper into the body and eventually into the bloodstream, causing you to feel ill.  DIAGNOSIS  Your caregiver will take your medical history and perform a physical exam. A sample of fluid may also be taken from the abscess to determine what is causing your infection. TREATMENT  Your caregiver may prescribe antibiotic medicines to fight the infection. However, taking antibiotics alone usually does not cure an abscess. Your caregiver may need to make a small cut (incision) in the abscess to drain the pus. In some cases, gauze is packed into the abscess to reduce pain and to continue draining the area. HOME CARE INSTRUCTIONS   Only take over-the-counter or prescription medicines for pain, discomfort, or fever as directed by your  caregiver.  If you were prescribed antibiotics, take them as directed. Finish them even if you start to feel better.  If gauze is used, follow your caregiver's directions for changing the gauze.  To avoid spreading the infection:  Keep your draining abscess covered with a bandage.  Wash your hands well.  Do not share personal care items, towels, or whirlpools with others.  Avoid skin contact with others.  Keep your skin and clothes clean around the abscess.  Keep all follow-up appointments as directed by your caregiver. SEEK MEDICAL CARE IF:   You have increased pain, swelling, redness, fluid drainage, or bleeding.  You have muscle aches, chills, or a general ill feeling.  You have a fever. MAKE SURE YOU:   Understand these instructions.  Will watch your condition.  Will get help right away if you are not doing well or get worse. Document Released: 08/20/2005 Document Revised: 05/11/2012 Document Reviewed: 01/23/2012 [facility] Patient Information 2015 [place], [place]. This information is not intended to replace advice given to you by your health care provider. Make sure you discuss any questions you have with your health care provider.

## 2015-01-12 ENCOUNTER — Ambulatory Visit: Payer: Medicare Other | Admitting: Gynecologic Oncology

## 2015-01-12 ENCOUNTER — Encounter: Payer: Self-pay | Admitting: Gynecologic Oncology

## 2015-01-12 ENCOUNTER — Ambulatory Visit: Payer: Medicare Other | Attending: Gynecologic Oncology | Admitting: Gynecologic Oncology

## 2015-01-12 VITALS — BP 155/87 | HR 94 | Temp 97.6°F | Ht 67.0 in | Wt 130.4 lb

## 2015-01-12 DIAGNOSIS — C189 Malignant neoplasm of colon, unspecified: Secondary | ICD-10-CM | POA: Diagnosis not present

## 2015-01-12 DIAGNOSIS — C562 Malignant neoplasm of left ovary: Secondary | ICD-10-CM | POA: Insufficient documentation

## 2015-01-12 DIAGNOSIS — M79605 Pain in left leg: Secondary | ICD-10-CM | POA: Diagnosis not present

## 2015-01-12 DIAGNOSIS — Z483 Aftercare following surgery for neoplasm: Secondary | ICD-10-CM

## 2015-01-12 MED ORDER — HYDROCODONE-ACETAMINOPHEN 5-325 MG PO TABS: 1.0000 | ORAL_TABLET | ORAL | Status: DC | PRN

## 2015-01-12 NOTE — Progress Notes (Signed)
POSTOPERATIVE FOLLOWUP VISIT  HPI:  Catherine Rogers is a 54 y.o. year old No obstetric history on file. 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  She then underwent a expiratory laparotomy right salpingo-oophorectomy, omentectomy, pelvic and para-aortic lymph node dissection, biopsies on 12/30/83 without complications.  Her postoperative course was complicated by postoperative anemia caused by acute blood loss. She was transfused for this with a stable resulting Hb. Preoperatively she had reported to me the onset of left lower extremity tingling, numbness and pain after beginning oxycodone treatment for post surgical pain from her prior surgery. She reported that this pain is exacerbated after this most recent surgery. She was seen by neurology as an inpatient and workup with imaging of her lumbar and thorac spine and CT of the abdomen and pelvis was also unremarkable.  she was seen in the emergency department [facility] yesterday for abdominal wound drainage and abdominal pain. There was no drainage noted at her incision. A CT scan of the abdomen or pelvis was performed and showed a right pelvic fluid collection and a right midabdominal fluid collection. These were concerning for abscess however the patient was afebrile, and had a normal white blood cell count and no left shift. I reviewed the images with the interventional radiology Department and we agree that these findings PA consistent with both postoperative fluid and possible with the still formation and the interventional radiologist did not feel that they're amenable to drainage and particularly in the absence of signs of infection.    Her final pathology revealed no cancer residual in the remaining specimens confirming a stage I ovarian cancer.  She is seen today for a postoperative check and to discuss her pathology results and ongoing plan.  Since  discharge from the hospital, she is feeling persistent left lower leg pain, numbness and weakness. This is predominantly in the lower leg. It is the same pain that she felt preoperatively but is persistent postop.  She has improving appetite, normal bowel and bladder function, and pain controlled with minimal PO medication. She has no other complaints today. She has no ongoing drainage from the incision and no fevers.    Review of systems: Constitutional:  She has no weight gain or weight loss. She has no fever or chills. Eyes: No blurred vision Ears, Nose, Mouth, Throat: No dizziness, headaches or changes in hearing. No mouth sores. Cardiovascular: No chest pain, palpitations or edema. Respiratory:  No shortness of breath, wheezing or cough Gastrointestinal: She has normal bowel movements without diarrhea or constipation. She denies any nausea or vomiting. She denies blood in her stool or heart burn. Genitourinary:  She denies pelvic pain, pelvic pressure or changes in her urinary function. She has no hematuria, dysuria, or incontinence. She has no irregular vaginal bleeding or vaginal discharge Musculoskeletal: see above.  Skin:  She has no skin changes, rashes or itching Neurological:  See above Psychiatric:  She denies depression or anxiety. Hematologic/Lymphatic:   No easy bruising or bleeding   Physical Exam: Blood pressure 155/87, pulse 94, temperature 97.6 F (36.4 C), temperature source Oral, height 5\' 7"  (1.702 m), weight 130 lb 6.4 oz (59.149 kg), SpO2 100 %. General: Well dressed, well nourished in no apparent distress.   HEENT:  Normocephalic and atraumatic, no lesions.  Extraocular muscles intact. Sclerae anicteric. Pupils equal, round, reactive. No mouth sores or ulcers. Thyroid is normal size, not nodular, midline. Skin:  No lesions  or rashes. Breasts:  Soft, symmetric.  No skin or nipple changes.  No palpable LN or masses. Lungs:  Clear to auscultation bilaterally.  No  wheezes. Cardiovascular:  Regular rate and rhythm.  No murmurs or rubs. Abdomen:  Soft, nontender, nondistended.  No palpable masses.  No hepatosplenomegaly.  No ascites. Normal bowel sounds.  No hernias.  Incision is healing well. No drainage, staples in and removed today. Wound in tact without infection. Genitourinary: deferred Extremities: No cyanosis, clubbing or edema.  No calf tenderness or erythema. No palpable cords. Psychiatric: Mood and affect are appropriate. Neurological: Awake, alert and oriented x 3. Decreased but not absent sensation left lower extremity.  Musculoskeletal: No pain, normal strength and range of motion.  Assessment:    54 y.o. 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   S/p ex lap, RSO, lymphadenectomy, omentectomy on 12/26/14.   Plan: 1) Pathology reports reviewed today 2) Treatment counseling - I am recommending that Dr Burr Medico pursue a chemotherapy regimen tailored to the colon cancer as her ovarian cancer is stage I. While we would normally prescribe 6 cycles of carboplatin and paclitaxel for this cancer, even at a stage I, I believe that her more advanced colon cancer should dictate the chemotherapeutic regimen. Additionally, a platinum agent if used as part of her colon cancer therapy, will have therapeutic effect for the ovarian cancer.  She is at a high risk for carrying Lynch syndrome and we have scheduled her to see genetics counseling. She was given the opportunity to ask questions, which were answered to her satisfaction, and she is agreement with the above mentioned plan of care. 3) Left leg pain: the etiology is unclear. This pain predated her surgery, and I do not believe it is a result of surgical injury or trauma. We have referred her to outpatient neurology for additional workup.  4)  She does not require followup with our clinic until after she has completed her colon cancer treatment at which time, if NED, she can see  [place] intermittently as part of her ovarian cancer surveillance.  [name], MD

## 2015-01-12 NOTE — Patient Instructions (Signed)
Appointment arranged to see Dr. Posey Pronto with [facility] on March 22 at 9:30 am.  They stated if you need to cancel this appointment, please give 24 hour notice or there is a $25.00 fee.  We will contact you if we are able to get you a sooner appointment at a different office.  You will also receive a phone call from the [place] about arranging a genetics referral.

## 2015-01-15 ENCOUNTER — Other Ambulatory Visit: Payer: Self-pay | Admitting: Gynecologic Oncology

## 2015-01-15 ENCOUNTER — Telehealth: Payer: Self-pay | Admitting: Neurology

## 2015-01-15 ENCOUNTER — Telehealth: Payer: Self-pay | Admitting: *Deleted

## 2015-01-15 ENCOUNTER — Ambulatory Visit (HOSPITAL_COMMUNITY): Payer: Medicare Other

## 2015-01-15 DIAGNOSIS — R200 Anesthesia of skin: Secondary | ICD-10-CM

## 2015-01-15 NOTE — Telephone Encounter (Signed)
Dr. Terrence Dupont Rossi's office/referring provider called to cancel pt's NP appt for 02/13/15. They did not give a reason.

## 2015-01-15 NOTE — Telephone Encounter (Signed)
Called pt to advise appt with Dr. Krista Blue at [facility] 2/23 at [place] pt address and phone # of clinic.  Pt requested letter be faxed to [facility] [phone].to confirm appt from last week

## 2015-01-15 NOTE — Telephone Encounter (Signed)
Pt previously request letter to be faxed to [place] confirming an appt on 2/19 with Dr. Denman George. Fax # pt gave invalid.  Called [facility] to confirm fax#. [facility] advised they do not request for a letter to be faxed or sent to office confirming pt's visit.  It is their policy that they will call the medical office and confirm pt has an appt and will call to confirm pt arrived and was seen on specific date. No letter will be sent, verbal confirmation given to [facility] regarding pt office visit on 2/19.

## 2015-01-16 ENCOUNTER — Telehealth: Payer: Self-pay | Admitting: Hematology

## 2015-01-16 ENCOUNTER — Telehealth: Payer: Self-pay | Admitting: *Deleted

## 2015-01-16 ENCOUNTER — Ambulatory Visit: Payer: Self-pay | Admitting: Neurology

## 2015-01-16 NOTE — Telephone Encounter (Signed)
returned pt call and she need me to fax her appt to her transportation [phone]...done

## 2015-01-16 NOTE — Telephone Encounter (Signed)
Patient called the same day as appt to reschedule.

## 2015-01-17 ENCOUNTER — Ambulatory Visit: Payer: Medicare Other | Admitting: Gynecologic Oncology

## 2015-01-17 ENCOUNTER — Encounter: Payer: Self-pay | Admitting: Neurology

## 2015-01-18 ENCOUNTER — Ambulatory Visit: Payer: Medicare Other | Admitting: Hematology

## 2015-01-18 ENCOUNTER — Other Ambulatory Visit: Payer: Medicare Other

## 2015-01-19 ENCOUNTER — Other Ambulatory Visit: Payer: Self-pay | Admitting: Gynecologic Oncology

## 2015-01-19 ENCOUNTER — Telehealth: Payer: Self-pay | Admitting: *Deleted

## 2015-01-19 ENCOUNTER — Other Ambulatory Visit: Payer: Self-pay | Admitting: *Deleted

## 2015-01-19 DIAGNOSIS — R509 Fever, unspecified: Secondary | ICD-10-CM

## 2015-01-19 DIAGNOSIS — R188 Other ascites: Secondary | ICD-10-CM

## 2015-01-19 DIAGNOSIS — C562 Malignant neoplasm of left ovary: Secondary | ICD-10-CM

## 2015-01-19 DIAGNOSIS — R1084 Generalized abdominal pain: Secondary | ICD-10-CM

## 2015-01-19 MED ORDER — HYDROCODONE-ACETAMINOPHEN 5-325 MG PO TABS: 1.0000 | ORAL_TABLET | ORAL | Status: DC | PRN

## 2015-01-19 MED ORDER — METRONIDAZOLE 500 MG PO TABS: 500.0000 mg | ORAL_TABLET | Freq: Three times a day (TID) | ORAL | Status: DC

## 2015-01-19 MED ORDER — CIPROFLOXACIN HCL 500 MG PO TABS
500.0000 mg | ORAL_TABLET | Freq: Two times a day (BID) | ORAL | Status: DC
Start: 2015-01-19 — End: 2015-01-29

## 2015-01-19 NOTE — Telephone Encounter (Signed)
Called patient and let her know that Dr. Denman George is starting her on two antibiotics - Cipro and Flagyl. She is agreeable to pick them up at her pharmacy this evening. Told patient that we will call and check on her Monday and see how she is feeling.

## 2015-01-19 NOTE — Progress Notes (Unsigned)
Patient called reporting abdominal pain.  She reports chills and a temp of 100.4 yesterday.  Temp of 99.6 today.  Requesting refill on pain medication.  Also reporting intermittent hot flashes.

## 2015-01-22 NOTE — Telephone Encounter (Signed)
Attempted to call patient to see how she is feeling - left VM requesting a callback.

## 2015-01-23 NOTE — Telephone Encounter (Signed)
Patient called back and stated that she picked up both antibiotics and pain medicine prescription. She states she is out of town until March 7 but did bring the prescriptions with her and states she has been taking both antibiotics. She denies any fever or other problems. Told patient to please call our clinic back if any new symptoms develop or if she has any concerns at all - patient agreeable to this.

## 2015-01-24 ENCOUNTER — Telehealth: Payer: Self-pay | Admitting: Hematology

## 2015-01-24 NOTE — Telephone Encounter (Signed)
Left message to confirm appointment for 03/04.

## 2015-01-25 ENCOUNTER — Telehealth: Payer: Self-pay | Admitting: Hematology

## 2015-01-25 ENCOUNTER — Telehealth: Payer: Self-pay | Admitting: *Deleted

## 2015-01-25 DIAGNOSIS — C3490 Malignant neoplasm of unspecified part of unspecified bronchus or lung: Secondary | ICD-10-CM | POA: Diagnosis not present

## 2015-01-25 DIAGNOSIS — S2242XD Multiple fractures of ribs, left side, subsequent encounter for fracture with routine healing: Secondary | ICD-10-CM | POA: Diagnosis not present

## 2015-01-25 NOTE — Telephone Encounter (Signed)
Left message returning voice mail left due to paitent being out of town for appointment 03/04. Canceled appointment, ask patient to return call to reschedule appointment when she will be in town.

## 2015-01-25 NOTE — Telephone Encounter (Signed)
Patient called after receipt of message about appointment.  Is out of the stat.  Returns 12-31-2014 and is scheduled with Dr. Ronny Flurry.  Asked for an appointment next week as early as 01-30-2015.  Call transferred to [facility] with scheduling.  Patient left voicemail.

## 2015-01-26 ENCOUNTER — Ambulatory Visit: Payer: Medicare Other | Admitting: Hematology

## 2015-01-26 ENCOUNTER — Other Ambulatory Visit: Payer: Medicare Other

## 2015-01-26 ENCOUNTER — Telehealth: Payer: Self-pay | Admitting: *Deleted

## 2015-01-26 ENCOUNTER — Telehealth: Payer: Self-pay | Admitting: Hematology

## 2015-01-26 NOTE — Telephone Encounter (Addendum)
LEFT VOICE MAIL MESSAGE FOR AMBER (MARGARET) BLEDSOLE TO RETURN A CALL TO PT. CONCERNING HER APPOINTMENT.

## 2015-01-26 NOTE — Telephone Encounter (Signed)
Confirm appointment for 03/14.

## 2015-01-29 ENCOUNTER — Ambulatory Visit (INDEPENDENT_AMBULATORY_CARE_PROVIDER_SITE_OTHER): Payer: Medicare Other | Admitting: Neurology

## 2015-01-29 ENCOUNTER — Telehealth: Payer: Self-pay | Admitting: Neurology

## 2015-01-29 ENCOUNTER — Encounter: Payer: Self-pay | Admitting: Neurology

## 2015-01-29 VITALS — BP 164/93 | HR 88 | Ht 67.0 in | Wt 126.0 lb

## 2015-01-29 DIAGNOSIS — C562 Malignant neoplasm of left ovary: Secondary | ICD-10-CM | POA: Diagnosis not present

## 2015-01-29 DIAGNOSIS — M79605 Pain in left leg: Secondary | ICD-10-CM

## 2015-01-29 DIAGNOSIS — R202 Paresthesia of skin: Secondary | ICD-10-CM

## 2015-01-29 DIAGNOSIS — G541 Lumbosacral plexus disorders: Secondary | ICD-10-CM

## 2015-01-29 MED ORDER — PREGABALIN 100 MG PO CAPS: 100.0000 mg | ORAL_CAPSULE | Freq: Two times a day (BID) | ORAL | Status: DC

## 2015-01-29 MED ORDER — HYDROCODONE-ACETAMINOPHEN 5-325 MG PO TABS: 1.0000 | ORAL_TABLET | ORAL | Status: DC | PRN

## 2015-01-29 NOTE — Progress Notes (Signed)
PATIENT: Catherine Rogers DOB: [birth year]  HISTORICAL  Catherine Rogers is a 54 years old ambidextrous female, she is referred by her gynecologist Dr. Denman George for evaluation of left lower extremity paresthesia.  She presented with GI bleeding October, was found to have obstructive right colon, she had right hemicolectomy by Dr. 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 Denman George for exploratory laparotomy with right self single oophorectomy, pelvic, and para-aortic been for note dissection omentectomy, per surgical record, there was left-sided pelvic lymph node dissection, from proximally the bifurcation of the common iliac, distally the circumflex iliac vein, laterally the genitofemoral nerve, the medial border was the superior vesicle artery and the deep border was the obturator nerve.  Few minutes before she was put under for her third surgery February second 2016, she noticed intense shooting pain to her left lower extremity, woke up from surgery complains of 10 out of 10 extreme left lower extremity paresthesia and pain," I want him to take my left leg off," her left leg pain has been persistent since its onset,  She underwent reevaluation, doing her hospital stay  Left lower extremity venous Doppler February third 2016 showed no evidence of DVT.  CT abdomen in Feb 16th 2016:  There are postsurgical changes post hysterectomy. Midline lower abdominal, pelvic wall scarring, there is a elongated loculated fluid collection in the right pelvic sidewall, extending along the medial aspect of iliac bone/right acetabulum. The collection measures at least 6.1 x 2.5 cm. This is highly suspicious for infected fluid or  early pelvic abscess. A second loculated fluid collection in right anterior pelvis/ mesentery axial image 49 on delayed images Measures 3.6 x 3.2 cm. Small amount of complex fluid is noted in right pelvis anteriorly just above the urinary bladder.  I have reviewed CT of lumbar, thoracic, in December 28 2014,Mild disc degeneration and moderate to severe lower lumbar facet arthrosis, worst at L4-5 where there is moderate spinal stenosis and moderate bilateral neural foraminal stenosis, No evidence of osseous metastases in the lumbar spine.  Mild thoracic degenerative changes, no significant foraminal canal stenosis  Patient's is now complaining significant left lower extremity deep achy pain, more at left distal leg, subjective weakness, she denies significant low back pain, no right lower extremity involvement  REVIEW OF SYSTEMS: Full 14 system review of systems performed and notable only for   ALLERGIES: Allergies  Allergen Reactions  . Other Hives and Itching    Highly acidic foods "make me itch and break out" oranges, tomatoes  . Demerol [Meperidine] Nausea And Vomiting  . Ibuprofen Nausea And Vomiting  . Morphine And Related Nausea And Vomiting  . Oxycodone Itching  . Penicillins Other (See Comments)    Childhood allergy  . Tylenol [Acetaminophen] Nausea And Vomiting    HOME MEDICATIONS: Current Outpatient Prescriptions  Medication Sig Dispense Refill  . docusate sodium 100 MG CAPS Take 100 mg by mouth 2 (two) times daily as needed for mild constipation. (Patient taking differently: Take 100 mg by mouth 2 (two) times daily as needed (constipation). ) 10 capsule 0  . ferrous sulfate 325 (65 FE) MG tablet Take 1 tablet (325 mg total) by mouth 2 (two) times daily with a meal.  60 tablet 3  . gabapentin (NEURONTIN) 100 MG capsule Take 1 capsule (100 mg total) by mouth 2 (two) times daily. 60 capsule 1  . hydrochlorothiazide (HYDRODIURIL) 25 MG tablet Take 25 mg by mouth every morning.      [name] HYDROcodone-acetaminophen (NORCO) 5-325 MG per tablet Take 1-2 tablets by mouth every 4 (four) hours as needed. 40 tablet 0  . methocarbamol (ROBAXIN) 500 MG tablet Take 500 mg by mouth every 8 (eight) hours as needed for muscle spasms.     . ondansetron (ZOFRAN) 8 MG tablet Take 1 tablet (8 mg total) by mouth every 8 (eight) hours as needed for nausea or vomiting. 20 tablet 3  . ranitidine (ZANTAC) 150 MG tablet Take 150 mg by mouth 2 (two) times daily as needed for heartburn.        PAST MEDICAL HISTORY: Past Medical History  Diagnosis Date  . Hypertension   . Ectopic pregnancy   . Head injury, closed, with brief LOC     had a cut- as a child  . Asthma   . Anxiety   . Depression   . PTSD (post-traumatic stress disorder)   . GERD (gastroesophageal reflux disease)   . Arthritis   . Pelvic cyst   . Complication of anesthesia     woke and saw long tube coming out of mouth . sat up then went back to sleep  . PONV (postoperative nausea and vomiting)   . Family history of adverse reaction to anesthesia     sister on dialysis- hypotension   . Headache   . Cancer     colon and ovarian  . Anemia   . History of blood transfusion     2005 approximately     PAST SURGICAL HISTORY: Past Surgical History  Procedure Laterality Date  . Esophagogastroduodenoscopy N/A 09/17/2014    Procedure: ESOPHAGOGASTRODUODENOSCOPY (EGD);  Surgeon: Jeryl Columbia, MD;  Location: [facility] ENDOSCOPY;  Service: Endoscopy;  Laterality: N/A;  . Abdominal hysterectomy    . Tubal ligation    . Partial colectomy Right 09/18/2014    Procedure: RIGHT HEMI COLECTOMY;  Surgeon: Doreen Salvage, MD;  Location: [place];  Service: General;  Laterality: Right;  . Ileostomy Right 09/18/2014    Procedure: ILEOCOLOSTOMY;  Surgeon: Doreen Salvage, MD;  Location: [place];  Service: General;  Laterality: Right;  . Laparotomy N/A 11/01/2014    Procedure: EXPLORATORY LAPAROTOMY;  Surgeon: Doreen Salvage, MD;  Location: [place];  Service: General;   Laterality: N/A;  . Mass excision  11/01/2014    Procedure: EXCISION PELVIC MASS;  Surgeon: Doreen Salvage, MD;  Location: [place];  Service: General;;  . Robotic assisted total hysterectomy with bilateral salpingo oopherectomy Right 12/26/2014    Procedure: EXPLORATORY LAPAROSCOPY  RIGHT SALPINGO OOPHORECTOMY OMENTECTOMY LYMPHADECTOMY ;  Surgeon: Everitt Amber, MD;  Location: [facility];  Service: Gynecology;  Laterality: Right;  . Laparotomy N/A 12/26/2014    Procedure:  LAPAROTOMY ;  Surgeon: Everitt Amber, MD;  Location: [facility];  Service: Gynecology;  Laterality: N/A;  . Oophorectomy  2016    FAMILY HISTORY: Family History  Problem Relation Age of Onset  . Cancer Sister 67    brain cancer   . Cancer Brother 1    lung cancer   . Heart attack Mother   . Heart attack Father     54s    SOCIAL HISTORY:  History   Social History  . Marital Status: Widowed    Spouse Name: N/A  .  Number of Children: 6  . Years of Education: 13   Occupational History  . Disability    Social History Main Topics  . Smoking status: Never Smoker   . Smokeless tobacco: Never Used     Comment: pack per month of cigars   . Alcohol Use: 4.2 oz/week    7 Standard drinks or equivalent per week     Comment: socially   . Drug Use: Yes    Special: Marijuana     Comment: smokes marijuana every 2-3 days, last use in january    . Sexual Activity: No   Other Topics Concern  . Not on file   Social History Narrative   Born and raised in [place], [place]. Currently resides in a house by herself. No pets. Fun: Read, shoot pool.    Denies religious beliefs that would effect health care.    Lives at home alone.   Right-handed.   No caffeine use.   PHYSICAL EXAM   Filed Vitals:   01/29/15 1236  BP: 164/93  Pulse: 88  Height: 5\' 7"  (1.702 m)  Weight: 126 lb (57.153 kg)    Not recorded      Body mass index is 19.73 kg/(m^2).  PHYSICAL EXAMNIATION:  Gen: NAD, conversant, well nourised, obese, well groomed                      Cardiovascular: Regular rate rhythm, no peripheral edema, warm, nontender. Eyes: Conjunctivae clear without exudates or hemorrhage Neck: Supple, no carotid bruise. Pulmonary: Clear to auscultation bilaterally   NEUROLOGICAL EXAM:  MENTAL STATUS: Speech:    Speech is normal; fluent and spontaneous with normal comprehension.  Cognition:    The patient is oriented to person, place, and time;     recent and remote memory intact;     language fluent;     normal attention, concentration,     fund of knowledge.  CRANIAL NERVES: CN II: Visual fields are full to confrontation. Fundoscopic exam is normal with sharp discs and no vascular changes. Venous pulsations are present bilaterally. Pupils are 4 mm and briskly reactive to light. Visual acuity is 20/20 bilaterally. CN III, IV, VI: extraocular movement are normal. No ptosis. CN V: Facial sensation is intact to pinprick in all 3 divisions bilaterally. Corneal responses are intact.  CN VII: Face is symmetric with normal eye closure and smile. CN VIII: Hearing is normal to rubbing fingers CN IX, X: Palate elevates symmetrically. Phonation is normal. CN XI: Head turning and shoulder shrug are intact CN XII: Tongue is midline with normal movements and no atrophy.  MOTOR: There is no pronator drift of out-stretched arms. Muscle bulk and tone are normal. Muscle strength is normal.   Shoulder abduction Shoulder external rotation Elbow flexion Elbow extension Wrist flexion Wrist extension Finger abduction Hip flexion Knee flexion Knee extension Ankle dorsi flexion Ankle plantar flexion  R 5 5 5 5 5 5 5 5 5 5 5 5   L 5 5 5 5 5 5 5 5 5 5 5 5     REFLEXES: Reflexes are 2+ and symmetric at the biceps, triceps, knees, and ankles. Plantar responses are flexor.  SENSORY:Hypersensitivity to light touch, pinprick at left distal leg, involving top, bottom of left foot, extending to distal area above ankle level, preserved   left toe position  sense, and vibration sense .  COORDINATION: Rapid alternating movements and fine finger movements are intact. There is no dysmetria on finger-to-nose and  heel-knee-shin. There are no abnormal or extraneous movements.   GAIT/STANCE: Antalgic gait, due to left leg pain,  Romberg is absent.   DIAGNOSTIC DATA (LABS, IMAGING, TESTING) - I reviewed patient records, labs, notes, testing and imaging myself where available.  Lab Results  Component Value Date   WBC 5.8 01/11/2015   HGB 9.3* 01/11/2015   HCT 27.2* 01/11/2015   MCV 98.2 01/11/2015   PLT 330 01/11/2015      Component Value Date/Time   NA 138 01/11/2015 1116   NA 142 12/05/2014 1520   K 3.3* 01/11/2015 1116   K 3.8 12/05/2014 1520   CL 102 01/11/2015 1116   CO2 25 01/11/2015 1116   CO2 26 12/05/2014 1520   GLUCOSE 100* 01/11/2015 1116   GLUCOSE 104 12/05/2014 1520   BUN 17 01/11/2015 1116   BUN 11.2 12/05/2014 1520   CREATININE 0.66 01/11/2015 1116   CREATININE 0.8 12/05/2014 1520   CALCIUM 9.6 01/11/2015 1116   CALCIUM 9.3 12/05/2014 1520   PROT 7.4 12/22/2014 1425   PROT 7.7 12/05/2014 1520   ALBUMIN 4.3 12/22/2014 1425   ALBUMIN 4.3 12/05/2014 1520   AST 22 12/22/2014 1425   AST 21 12/05/2014 1520   ALT 16 12/22/2014 1425   ALT 11 12/05/2014 1520   ALKPHOS 79 12/22/2014 1425   ALKPHOS 77 12/05/2014 1520   BILITOT 0.6 12/22/2014 1425   BILITOT 0.46 12/05/2014 1520   GFRNONAA >90 01/11/2015 1116   GFRAA >90 01/11/2015 1116   No results found for: CHOL, HDL, LDLCALC, LDLDIRECT, TRIG, CHOLHDL No results found for: HGBA1C No results found for: VITAMINB12 No results found for: TSH    ASSESSMENT AND PLAN  Gesenia Bantz is a 54 y.o. femalecomplains ent left lower extremity pain,  since her left pelvic surgery in February second 2016, involving left L4, 5, S1 dermatomes. There was no significant weakness.     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 Ph.D.  [facility] [address] [place], [place] [address] Ph: [phone] Fax: [phone]

## 2015-02-02 ENCOUNTER — Telehealth: Payer: Self-pay | Admitting: *Deleted

## 2015-02-02 NOTE — Telephone Encounter (Signed)
Patient called "to verify appointment for next week."  Called back, voice mail answered.  Gave date and times as requested.

## 2015-02-02 NOTE — Telephone Encounter (Signed)
error 

## 2015-02-05 ENCOUNTER — Other Ambulatory Visit: Payer: Medicare Other

## 2015-02-05 ENCOUNTER — Encounter: Payer: Self-pay | Admitting: *Deleted

## 2015-02-05 ENCOUNTER — Ambulatory Visit: Payer: Medicare Other | Admitting: Hematology

## 2015-02-05 ENCOUNTER — Telehealth: Payer: Self-pay | Admitting: Hematology

## 2015-02-05 NOTE — Progress Notes (Signed)
[place] [employer] representative received referral for transportation concerns.  CSW contacted patient at home to offer support and assess for needs.  Patient lives in [place], [place] and has been using [employer] transportation.  Patient expressed frustration with [employer] and the scheduling process.  CSW explained to patient that due to where she lives there is limited transportation resources.  Patient reported that [employer] "did not show" for her last appointment after she scheduled transportation 10 days in advance.  CSW contacted [employer] and spoke to [name] regarding the patients concerns.  According to [name] they called [name] to confirm the patients appointment and were told she was not on the schedule.  CSW expressed the importance of not missing appointments and additional stress this causes the patient.  CSW then requested to schedule transportation for patients upcoming appointment.  CSW was transferred to scheduling and scheduled patients transportation for her appointment on 02/09/15.  CSW informed patient that her transportation had been scheduled and she would be contacted by the transportation company with her pick up time.  Patient was appreciative of CSW support and verbalized understanding.  CSW encouraged patient to cal with any questions or concerns.   Johnnye Lana, MSW, LCSW, OSW-C Clinical Social Worker [facility] [phone]

## 2015-02-05 NOTE — Telephone Encounter (Signed)
Returned patients voice mail in regards to transportation issues. Patient confirmed reschedule appointment for 03/17. Sent patient to [employer], also sent a message to [facility] to see if we can help patient with transportion.

## 2015-02-08 ENCOUNTER — Encounter: Payer: Self-pay | Admitting: *Deleted

## 2015-02-08 NOTE — Progress Notes (Signed)
[facility]  Clinical Social Work was phoned by pt due to no contact from [facility] to arrange transportation. CSW returned pt's call and she stated they had called her and all was set. Pt appreciated return call, no other needs noted.    Loren Racer, [place] Worker [place]  [place] Phone: [phone] Fax: [phone]

## 2015-02-09 ENCOUNTER — Other Ambulatory Visit (HOSPITAL_BASED_OUTPATIENT_CLINIC_OR_DEPARTMENT_OTHER): Payer: Medicare Other

## 2015-02-09 ENCOUNTER — Ambulatory Visit (HOSPITAL_BASED_OUTPATIENT_CLINIC_OR_DEPARTMENT_OTHER): Payer: Medicare Other | Admitting: Hematology

## 2015-02-09 VITALS — BP 128/96 | HR 97 | Temp 98.0°F | Resp 18 | Ht 67.0 in | Wt 125.2 lb

## 2015-02-09 DIAGNOSIS — C182 Malignant neoplasm of ascending colon: Secondary | ICD-10-CM

## 2015-02-09 DIAGNOSIS — C189 Malignant neoplasm of colon, unspecified: Secondary | ICD-10-CM

## 2015-02-09 DIAGNOSIS — G609 Hereditary and idiopathic neuropathy, unspecified: Secondary | ICD-10-CM

## 2015-02-09 DIAGNOSIS — C562 Malignant neoplasm of left ovary: Secondary | ICD-10-CM

## 2015-02-09 LAB — TECHNOLOGIST REVIEW

## 2015-02-09 LAB — COMPREHENSIVE METABOLIC PANEL (CC13)
ALK PHOS: 76 U/L (ref 40–150)
ALT: 14 U/L (ref 0–55)
AST: 21 U/L (ref 5–34)
Albumin: 3.8 g/dL (ref 3.5–5.0)
Anion Gap: 9 mEq/L (ref 3–11)
BILIRUBIN TOTAL: 0.55 mg/dL (ref 0.20–1.20)
BUN: 18.4 mg/dL (ref 7.0–26.0)
CO2: 27 mEq/L (ref 22–29)
Calcium: 9.7 mg/dL (ref 8.4–10.4)
Chloride: 103 mEq/L (ref 98–109)
Creatinine: 1 mg/dL (ref 0.6–1.1)
EGFR: 73 mL/min/{1.73_m2} — AB (ref >90)
GLUCOSE: 103 mg/dL (ref 70–140)
Potassium: 4 mEq/L (ref 3.5–5.1)
SODIUM: 139 meq/L (ref 136–145)
Total Protein: 7.9 g/dL (ref 6.4–8.3)

## 2015-02-09 LAB — CBC WITH DIFFERENTIAL/PLATELET
BASO%: 0.2 % (ref 0.0–2.0)
Basophils Absolute: 0 10*3/uL (ref 0.0–0.1)
EOS%: 1.6 % (ref 0.0–7.0)
Eosinophils Absolute: 0.1 10*3/uL (ref 0.0–0.5)
HCT: 37 % (ref 34.8–46.6)
HGB: 12.9 g/dL (ref 11.6–15.9)
LYMPH%: 38.8 % (ref 14.0–49.7)
MCH: 35.1 pg — ABNORMAL HIGH (ref 25.1–34.0)
MCHC: 34.9 g/dL (ref 31.5–36.0)
MCV: 100.8 fL (ref 79.5–101.0)
MONO#: 0.6 10*3/uL (ref 0.1–0.9)
MONO%: 8.9 % (ref 0.0–14.0)
NEUT#: 3.2 10*3/uL (ref 1.5–6.5)
NEUT%: 50.5 % (ref 38.4–76.8)
NRBC: 0 % (ref 0–0)
Platelets: 186 10*3/uL (ref 145–400)
RBC: 3.67 10*6/uL — AB (ref 3.70–5.45)
RDW: 14.3 % (ref 11.2–14.5)
WBC: 6.3 10*3/uL (ref 3.9–10.3)
lymph#: 2.4 10*3/uL (ref 0.9–3.3)

## 2015-02-09 MED ORDER — HYDROCODONE-ACETAMINOPHEN 7.5-500 MG PO TABS: 1.0000 | ORAL_TABLET | Freq: Four times a day (QID) | ORAL | Status: DC | PRN

## 2015-02-09 NOTE — Progress Notes (Addendum)
[place]  Telephone:([phone]) [phone] Fax:([phone]) [place] Note   Patient Care Team: Golden Circle, FNP as PCP - General (Family Medicine) Truitt Merle, MD as Consulting Physician (Hematology) Doreen Salvage, MD as Consulting Physician (General Surgery) Nat Math, MD as Referring Physician (Obstetrics and Gynecology) Everitt Amber, MD as Consulting Physician (Obstetrics and Gynecology) 02/09/2015  CHIEF COMPLAINTS/PURPOSE OF CONSULTATION:  1. Colon cancer, pT3N1M0, stage III 2. Ovarian cancer, pT1cNxM0, stage I      Adenocarcinoma of colon   09/16/2014 Imaging CT abd/pel:  a 3.8cm cecal mass with associated ileocecal intussusception, and a 9.1cm solid and cystic mass in the left pelvis,   09/18/2014 Initial Diagnosis Adenocarcinoma of right colon   09/18/2014 Pathologic Stage pT3pN1Mx, tumor extends into pericolonic soft tissue and is less than 27m from the serosal surface, LVI 8-), PNI (-), all 23 node negative, one soft tissue tumor deposit, surgical margins negative.    09/18/2014 Surgery right hemicolectomy and terminal ileoectomy    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   10/09/2014 Imaging CT of the abdomen and pelvis showed a 9.1 cm solid and cystic mass in the left pelvis concerning for malignancy. This was discovered during her workup for colon cancer.   11/01/2014 Initial Diagnosis 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   11/01/2014 Surgery Salpingo-oophorectomy.   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 Tumor size 3.7 cm, no lymph nodes removed. T1cNx. Tumor cells are positive for cytokeratin 7 and estrogen receptor.     HISTORY OF PRESENTING ILLNESS:  [name].[id number]. female is here because of recently diagnosed colon cancer.   She presented with abdominal pain, nausea and vomiting, hemoptoses in Oct 2015.  [facility] She had a CT scan that revealed a 3.8cm cecal mass with associated ileocecal intussusception, and a 9.1cm solid and cystic mass in the left pelvis, likely a peritoneal met with ascites.  EGD was negative. She had exploratory laparoscopy on 09/18/2014 by Dr. WHulen Skains and underwent right hemicolectomy and ileocecectomy. The pathology reviewed also related an invasive adenocarcinoma at the terminal ileum and cecum. She was discharged home on 09/22/2014.  She underwent a second surgery for left pelvis cystic mass resection on 11/01/2014, surgical path showed ovarian cancer.  INTERIM HISTORY: TGenereturns for follow-up. [facility] I scheduled her follow up appointment with me right after surgery, but she was no show twice and rescheduled her to today.  She has had severe left leg pain, numbness from left knee to the left foot.  She feels left leg "frozen", and interfere her sleep. She was seen by neurologist Dr. YKrista Bluea few weeks ago. MRI was ordered and she was given lyrica, which she did not tolerate well and feels the pain was getting worse. She stopped after one week. Her abdominal pain is better than before, she takes vicodin 6 tab daily, and is constipated. Her appetite is better also, she is able to function at home, able to do light house work.   MEDICAL HISTORY:  Past Medical History  Diagnosis Date  . Hypertension   . Ectopic pregnancy   . Head injury, closed, with brief LOC     had a cut- as a child  . Asthma   . Anxiety   . Depression   .  PTSD (post-traumatic stress disorder)   . GERD (gastroesophageal reflux disease)   . Arthritis   . Pelvic cyst   . Complication of anesthesia     woke and saw long tube coming out of mouth . sat up then went back to sleep  . PONV (postoperative nausea and vomiting)   . Family history of adverse reaction to anesthesia      sister on dialysis- hypotension   . Headache   . Cancer     colon and ovarian  . Anemia   . History of blood transfusion     2005 approximately   PTSD   SURGICAL HISTORY: Past Surgical History  Procedure Laterality Date  . Esophagogastroduodenoscopy N/A 09/17/2014    Procedure: ESOPHAGOGASTRODUODENOSCOPY (EGD);  Surgeon: Jeryl Columbia, MD;  Location: [facility] ENDOSCOPY;  Service: Endoscopy;  Laterality: N/A;  . Abdominal hysterectomy    . Tubal ligation    . Partial colectomy Right 09/18/2014    Procedure: RIGHT HEMI COLECTOMY;  Surgeon: Doreen Salvage, MD;  Location: [place];  Service: General;  Laterality: Right;  . Ileostomy Right 09/18/2014    Procedure: ILEOCOLOSTOMY;  Surgeon: Doreen Salvage, MD;  Location: [place];  Service: General;  Laterality: Right;  . Laparotomy N/A 11/01/2014    Procedure: EXPLORATORY LAPAROTOMY;  Surgeon: Doreen Salvage, MD;  Location: [place];  Service: General;  Laterality: N/A;  . Mass excision  11/01/2014    Procedure: EXCISION PELVIC MASS;  Surgeon: Doreen Salvage, MD;  Location: [place];  Service: General;;  . Robotic assisted total hysterectomy with bilateral salpingo oopherectomy Right 12/26/2014    Procedure: EXPLORATORY LAPAROSCOPY  RIGHT SALPINGO OOPHORECTOMY OMENTECTOMY LYMPHADECTOMY ;  Surgeon: Everitt Amber, MD;  Location: [facility] ORS;  Service: Gynecology;  Laterality: Right;  . Laparotomy N/A 12/26/2014    Procedure:  LAPAROTOMY ;  Surgeon: Everitt Amber, MD;  Location: [facility] ORS;  Service: Gynecology;  Laterality: N/A;  . Oophorectomy  2016    SOCIAL HISTORY: History   Social History  . Marital Status: Widowed    Spouse Name: N/A  . Number of Children: 6  . Years of Education: 13   Occupational History  . Disability    Social History Main Topics  . Smoking status: Never Smoker   . Smokeless tobacco: Never Used     Comment: pack per month of cigars   . Alcohol Use: 4.2 oz/week    7 Standard drinks or equivalent per week     Comment: socially   . Drug Use: Yes    Special:  Marijuana     Comment: smokes marijuana every 2-3 days, last use in january    . Sexual Activity: No   Other Topics Concern  . Not on file   Social History Narrative   Born and raised in [place], [place]. Currently resides in a house by herself. No pets. Fun: Read, shoot pool.    Denies religious beliefs that would effect health care.    Lives at home alone.   Right-handed.   No caffeine use.    FAMILY HISTORY: Family History  Problem Relation Age of Onset  . Cancer Sister 2    brain cancer   . Cancer Brother 43    lung cancer   . Heart attack Mother   . Heart attack Father     48s    ALLERGIES:  is allergic to other; demerol; ibuprofen; morphine and related; oxycodone; penicillins; and tylenol.  MEDICATIONS:  Current Outpatient Prescriptions  Medication Sig  Dispense Refill  . docusate sodium 100 MG CAPS Take 100 mg by mouth 2 (two) times daily as needed for mild constipation. (Patient taking differently: Take 100 mg by mouth 2 (two) times daily as needed (constipation). ) 10 capsule 0  . enoxaparin (LOVENOX) 40 MG/0.4ML injection Inject 0.4 mLs (40 mg total) into the skin daily. 25 Syringe 0  . ferrous sulfate 325 (65 FE) MG tablet Take 1 tablet (325 mg total) by mouth 2 (two) times daily with a meal. 60 tablet 3  . gabapentin (NEURONTIN) 100 MG capsule Take 1 capsule (100 mg total) by mouth 2 (two) times daily. 60 capsule 1  . hydrochlorothiazide (HYDRODIURIL) 25 MG tablet Take 25 mg by mouth every morning.     [name] HYDROcodone-acetaminophen (LORTAB) 7.5-500 MG per tablet Take 1 tablet by mouth every 6 (six) hours as needed for pain. 30 tablet 0  . methocarbamol (ROBAXIN) 500 MG tablet Take 500 mg by mouth every 8 (eight) hours as needed for muscle spasms.     . metroNIDAZOLE (FLAGYL) 500 MG tablet Take 1 tablet (500 mg total) by mouth 3 (three) times daily. 30 tablet 0  . ondansetron (ZOFRAN) 8 MG tablet Take 1 tablet (8 mg total) by mouth every 8 (eight) hours as needed for  nausea or vomiting. 20 tablet 3  . pregabalin (LYRICA) 100 MG capsule Take 1 capsule (100 mg total) by mouth 2 (two) times daily. 90 capsule 5  . ranitidine (ZANTAC) 150 MG tablet Take 150 mg by mouth 2 (two) times daily as needed for heartburn.      No current facility-administered medications for this visit.    REVIEW OF SYSTEMS:   Constitutional: Denies fevers, chills or abnormal night sweats, (+) weight loss and fatigue  Eyes: Denies blurriness of vision, double vision or watery eyes Ears, nose, mouth, throat, and face: Denies mucositis or sore throat Respiratory: Denies cough, (+) dyspnea on exertion, no wheezes Cardiovascular: Denies palpitation, chest discomfort or lower extremity swelling Gastrointestinal:  (+) nausea and abdominal pain at surgical site, no heartburn or change in bowel habits Skin: Denies abnormal skin rashes Lymphatics: Denies new lymphadenopathy or easy bruising Neurological:see HPI  Behavioral/Psych: Mood is stable, no new changes  All other systems were reviewed with the patient and are negative.  PHYSICAL EXAMINATION: ECOG PERFORMANCE STATUS: 2  Filed Vitals:   02/09/15 1327  BP: 128/96  Pulse: 97  Temp: 98 F (36.7 C)  Resp: 18   Filed Weights   02/09/15 1327  Weight: 125 lb 3.2 oz (56.79 kg)    GENERAL:alert, slightly distressed due to her pain SKIN: skin color, texture, turgor are normal, no rashes or significant lesions EYES: normal, conjunctiva are pink and non-injected, sclera clear OROPHARYNX:no exudate, no erythema and lips, buccal mucosa, and tongue normal  NECK: supple, thyroid normal size, non-tender, without nodularity LYMPH:  no palpable lymphadenopathy in the cervical, axillary or inguinal LUNGS: clear to auscultation and percussion with normal breathing effort HEART: regular rate & rhythm and no murmurs and no lower extremity edema ABDOMEN:abdomen soft, (+) tenderness at middle and low abdomen with gentle palpitation, no rebound  pain, normal bowel sounds, no organmegaly. She has a stable left over at the surgical scar, which was removed by my nurse today. Musculoskeletal:no cyanosis of digits and no clubbing  PSYCH: alert & oriented x 3 with fluent speech NEURO: Left lower extremity very sensitive to touch, no focal neuro deficits.  LABORATORY DATA:  I have reviewed the data as  listed Lab Results  Component Value Date   WBC 6.3 02/09/2015   HGB 12.9 02/09/2015   HCT 37.0 02/09/2015   MCV 100.8 02/09/2015   PLT 186 02/09/2015    Recent Labs  12/05/14 1520 12/22/14 1425  12/28/14 0918 12/29/14 0446 01/11/15 1116 02/09/15 1245  NA 142 142  < > 140 137 138 139  K 3.8 4.0  < > 4.1 3.9 3.3* 4.0  CL  --  110  < > 109 105 102  --   CO2 26 26  < > _0 GLUCOSE 104 110*  < > 96 83 100* 103  BUN 11.2 16  < > _1 18.4  CREATININE 0.8 0.65  < > 0.81 0.75 0.66 1.0  CALCIUM 9.3 9.4  < > 8.5 8.5 9.6 9.7  GFRNONAA  --  >90  < > 81* >90 >90  --   GFRAA  --  >90  < > >90 >90 >90  --   PROT 7.7 7.4  --   --   --   --  7.9  ALBUMIN 4.3 4.3  --   --   --   --  3.8  AST 21 22  --   --   --   --  21  ALT 11 16  --   --   --   --  14  ALKPHOS 77 79  --   --   --   --  76  BILITOT 0.46 0.6  --   --   --   --  0.55  < > = values in this interval not displayed.   PATHOLOGY REPORT 09/18/2014 Colon, segmental resection for tumor, terminal ileum and right - ULCERATED AND INVASIVE ADENOCARCINOMA SEE COMMENT. - NEGATIVE FOR LYMPH VASCULAR INVASION - NEGATIVE FOR PERINEURAL INVASION. 1 of 4 FINAL for Younce, Maranda [phone]) Diagnosis(continued) - TUMOR EXTENDS INTO PERICOLONIC SOFT TISSUE AND IS LESS THAN 1 MM FROM THE SEROSAL SURFACE. - TWENTY-THREE LYMPH NODES, NEGATIVE FOR TUMOR (0/23) - ONE SOFT TISSUE TUMOR DEPOSIT. - SURGICAL MARGINS, NEGATIVE FOR TUMOR. Microscopic Comment COLON AND RECTUM (INCLUDING TRANS-ANAL RESECTION): Specimen: Terminal ileum and right colon. Procedure: Right  colectomy. Tumor site: Cecum. Specimen integrity: Intact. Macroscopic intactness of mesorectum: N/A Macroscopic tumor perforation: Absent. Invasive tumor: Maximum size: 4.7 cm Histologic type(s): Adenocarcinoma Histologic grade and differentiation: G2: moderately differentiated/low grade Type of polyp in which invasive carcinoma arose: Tubular adenoma with high grade dysplasia Microscopic extension of invasive tumor: Tumor extends into pericolonic soft tissue, see comment. Lymph-Vascular invasion: Absent. Peri-neural invasion: Absent. Tumor deposit(s) (discontinuous extramural extension): (x1) Resection margins: Proximal margin: Negative. Distal margin: Negative. Circumferential (radial) (posterior ascending, posterior descending; lateral and posterior mid-rectum; and entire lower 1/3 rectum): Negative Mesenteric margin (sigmoid and transverse): N/A Distance closest margin (if all above margins negative): 3.3 cm (distal) Trans-anal resection margins only: Deep margin: N/A Mucosal Margin: N/A Distance closest mucosal margin (if negative): N/A Treatment effect (neo-adjuvant therapy): None. Additional polyp(s): None. Non-neoplastic findings: None Lymph nodes: number examined- 23; number positive: 0 Pathologic Staging: pT3, pN1c, pMX, see comment. Ancillary studies: Per the [place] gastrointestinal working group guidelines, tumor will be submitted for both mismatch repair protein expression by immunohistochemistry and microsatellite instability testing by PCR. The results will be reported in an add on. Comment: Although tumor is not directly involving the serosa (visceral peritoneum), tumor is less than 1 mm from the serosal surface and is associated with fibroinflammatory inflammation  involving the serosa. Some studies consider this finding to represent serosal involvement.  Tumor MSI: STABLE   Ovary, left and cystic mass 11/01/2014 - MIXED ENDOMETRIOID AND CLEAR CELL  CARCINOMA, FIGO GRADE III/III, OVARIAN PRIMARY. - FOCAL FALLOPIAN TUBE CARCINOMA IN SITU. - SEE ONCOLOGY TABLE BELOW.  Specimen(s): Left ovary and left fallopian tube. Procedure: (including lymph node sampling) Salpingo-oophorectomy. Primary tumor site (including laterality): Left ovary. Ovarian surface involvement: Present. Ovarian capsule intact without fragmentation: Disrupted. Maximum tumor size (cm): At least 3.7 cm. Histologic type: Mixed endometrioid and clear cell adenocarcinoma. Grade: III Peritoneal implants: (specify invasive or non-invasive): Not identified. Pelvic extension (list additional structures on separate lines and if involved): Not identified. Lymph nodes: number examined 0 ; number positive N/A TNM code: pT1c, pNX FIGO Stage (based on pathologic findings, needs clinical correlation): IC, at least. Comments: The carcinoma present in the current specimen is morphologically dissimilar from the tumor in the patient's previous colectomy, [phone]. The tumor cells in the current case are positive for cytokeratin 7 and estrogen receptor. There is faint staining for p53. Cytokeratin 20 and CDX2 are negative. 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 There is a small focus of carcinoma in situ present in fallopian tube epithelium in random tissue. Dr Claudette Laws has reviewed the case and concurs with the interpretation. The case was discussed with Dr Hulen Skains on 11/06/2014. (  RADIOGRAPHIC STUDIES: I have personally reviewed the radiological images as listed and agreed with the findings in the report.  Ct Abdomen Pelvis W Contrast 10/09/2014   IMPRESSION: Postoperative change consistent with resection of cecal tumor. Stable 9.1 cm solid and cystic mass in the left pelvis concerning for peritoneal metastasis.      ASSESSMENT & PLAN:  54 year old female with past medical history of  arthritis, hysterectomy, history of abdominal bleeding status post embolization in 2005, who presented with abdominal pain nausea vomiting and weight loss. CT of abdomen showed a 3.8cm cecal mass with associated ileocecal intussusception, and a 9.1cm solid and cystic mass in the left pelvis, likely a peritoneal met with ascites. She is status post right hemicolectomy and terminal ileoectomy on 09/18/2014, and left Salpingo-oophorectomy on 11/01/14.   1. Right colon cancer, PT3pN1M0, stage III, MSI-stable  -I reviewed her CT scan and the surgical pathology findings with her in details. Based on her surgical pathology, she has stage III disease.  -Her tumor microsatellite stability test showed stable, she unlikely has Lynch syndrome. -Also I recommended adjuvant chemotherapy, which is standard care for stage III colon cancer. Her chemotherapy has been postponed due to her multiple surgery. -She is now 5 months out of initial colon surgery, I think there is no benefit of adjuvant chemotherapy at this point.This was delayed due to multiple surgery and her non-complaince with follow up (multiple no show). She also has lot of neurological symptoms after the recent GYN surgery, performance status is also limited, not a good candidate for chemotherapy.  -we discussed survelliance plan, for a total of 5 years. I will see her back in May with a restaging CT.   2. Ovary cancer, left, pT1 cNX M0, at least stage I, mixed endometrioid and clear cell histology, grade 3, ER positive. - She underwent right right oophorectomy and  Pelvic lymph nodes biopsy for staging, which wall over negative. -She is likely need adjuvant chemotherapy, the standard therapy is carboplatin and paclitaxel for overan cancer. She is close to 3 months out  of her initial ovary surgery, and she developed left lower extremity neuropathy after her recent surgery, not a great candidate for chemotherapy. I discussed with Dr. Denman George about her adjuvant  adjuvant chemotherapy. Dr. Denman George feels her colon cancer is at much high risk for recurrence than ovarian cancer, and she is also quite far our from her initial ovaran cancer surgery, the benefit of chemotherapy for ovarian cancer is very limited. Due to her limited PS and pain issue, she is also not a good candidate for chemo.    3. abdominal and left lower extremity neuropathy   -Likely related to her surgery and chronic pain -She has been evaluated by neurologist Dr. Krista Blue, she will follow-up with her.  4. Coping and social support, history of depression and PTSD -She has had some difficulty coping with her synchronized colon and ovarian cancer. She lives alone, does not want her children to be involved much in her cancer care. I encouraged her to discuss with our social worker here. I expressed my emotional support to her.  5. Genetic counseling -Given her synchronized colon cancer, family history of brain and lung cancer, I'll ask our genetic counselor to see if she needs genetic testing. Lynch sydrome is unlikely giving her tumor is MSI stable.  Plan -follow up in 2 month with CT chest, abdomen and pelvis with contrast  -She would like to follow up with general surgeon Dr. Hulen Skains in the next few weeks.  All questions were answered. The patient knows to call the clinic with any problems, questions or concerns. I spent 20 minutes counseling the patient face to face. The total time spent in the appointment was 30 minutes and more than 50% was on counseling.     Truitt Merle, MD 02/09/2015 11:24 AM

## 2015-02-10 ENCOUNTER — Encounter: Payer: Self-pay | Admitting: Hematology

## 2015-02-13 ENCOUNTER — Telehealth: Payer: Self-pay | Admitting: Neurology

## 2015-02-13 ENCOUNTER — Ambulatory Visit: Payer: Medicare Other | Admitting: Neurology

## 2015-02-13 NOTE — Telephone Encounter (Signed)
I will not write her any pain medications until I see her again in her next follow-up in March 08 2015

## 2015-02-13 NOTE — Telephone Encounter (Signed)
I called the patient back.  Relayed providers message.  She verbalized understanding.

## 2015-02-13 NOTE — Telephone Encounter (Signed)
Pt is requesting a written Rx for HYDROcodone-acetaminophen (LORTAB) 7.5-500 MG per tablet. Please call when ready for pick up.

## 2015-02-13 NOTE — Telephone Encounter (Signed)
Patient is requesting we write Rx for Hydrocodone.  This was last written at [facility] on 03/07 for Norco 5/325mg  with instructions of 1-2 every 4 hours prn #90.  It appears a Rx for Lortab 7.5/500mg  was written by Dr Burr Medico on 03/18 with instructions of 1 every 6 hours prn #30.  Would you like to prescribe?  Please advise.  Thank you.

## 2015-02-15 ENCOUNTER — Telehealth: Payer: Self-pay | Admitting: *Deleted

## 2015-02-15 ENCOUNTER — Other Ambulatory Visit: Payer: Self-pay | Admitting: *Deleted

## 2015-02-15 DIAGNOSIS — C189 Malignant neoplasm of colon, unspecified: Secondary | ICD-10-CM

## 2015-02-15 DIAGNOSIS — C562 Malignant neoplasm of left ovary: Secondary | ICD-10-CM

## 2015-02-15 MED ORDER — HYDROCODONE-ACETAMINOPHEN 7.5-500 MG PO TABS: 1.0000 | ORAL_TABLET | Freq: Four times a day (QID) | ORAL | Status: DC | PRN

## 2015-02-15 NOTE — Telephone Encounter (Signed)
PT. WOULD LIKE TO PICK UP HER PRESCRIPTION TOMORROW. SHE ALSO ASKED ABOUT A CALL CONCERNING A GENETIC TESTING APPOINTMENT FOR 02/16/15. SPOKE [place]. THERE IS NOT AN ORDER OR AN APPOINTMENT IN THE SYSTEM. NOTIFIED PT. THAT AT THIS TIME SHE DOES NOT HAVE AN APPOINTMENT FOR GENETIC TESTING. THIS NOTE ROUTED TO DR.FENG AND THU BRAY,RN.

## 2015-02-16 ENCOUNTER — Telehealth: Payer: Self-pay | Admitting: Hematology

## 2015-02-16 DIAGNOSIS — M87852 Other osteonecrosis, left femur: Secondary | ICD-10-CM | POA: Diagnosis not present

## 2015-02-16 DIAGNOSIS — R202 Paresthesia of skin: Secondary | ICD-10-CM | POA: Diagnosis not present

## 2015-02-16 DIAGNOSIS — M169 Osteoarthritis of hip, unspecified: Secondary | ICD-10-CM | POA: Diagnosis not present

## 2015-02-16 DIAGNOSIS — Z8543 Personal history of malignant neoplasm of ovary: Secondary | ICD-10-CM | POA: Diagnosis not present

## 2015-02-17 ENCOUNTER — Other Ambulatory Visit: Payer: Self-pay | Admitting: Hematology

## 2015-02-17 NOTE — Telephone Encounter (Signed)
She called about her genetic counselling appointment. I called her back. Will hold on for now.  Catherine Rogers

## 2015-02-17 NOTE — Addendum Note (Signed)
Addended by: Truitt Merle on: 02/17/2015 08:04 PM   Modules accepted: Orders

## 2015-02-28 ENCOUNTER — Telehealth: Payer: Self-pay | Admitting: *Deleted

## 2015-02-28 ENCOUNTER — Other Ambulatory Visit: Payer: Self-pay | Admitting: *Deleted

## 2015-02-28 ENCOUNTER — Other Ambulatory Visit: Payer: Self-pay | Admitting: Hematology

## 2015-02-28 DIAGNOSIS — C562 Malignant neoplasm of left ovary: Secondary | ICD-10-CM

## 2015-02-28 DIAGNOSIS — C189 Malignant neoplasm of colon, unspecified: Secondary | ICD-10-CM

## 2015-02-28 MED ORDER — HYDROCODONE-ACETAMINOPHEN 7.5-500 MG PO TABS: 1.0000 | ORAL_TABLET | Freq: Four times a day (QID) | ORAL | Status: DC | PRN

## 2015-02-28 NOTE — Telephone Encounter (Signed)
Patient who reports "someone is on the way to pick up my prescription so please leave it out.  I didn't receive a call until ten till three that it was ready."

## 2015-02-28 NOTE — Telephone Encounter (Signed)
Patient called and requested a refill of her pain medication - hydrocodone/acetaminophen.  Her physician is Dr. Burr Medico.  Her call back is [phone].

## 2015-02-28 NOTE — Telephone Encounter (Signed)
Spoke with patient.  Let her know that rx is ready for pick-up before 4pm

## 2015-03-01 ENCOUNTER — Telehealth: Payer: Self-pay | Admitting: *Deleted

## 2015-03-01 ENCOUNTER — Telehealth: Payer: Self-pay | Admitting: Hematology

## 2015-03-01 NOTE — Telephone Encounter (Signed)
This was taken care of 02/28/15.

## 2015-03-01 NOTE — Telephone Encounter (Signed)
Called pt at home and left message on voice mail re: Dr. Burr Medico had made genetics referral.  Scheduler will contact pt with appt date and time.

## 2015-03-01 NOTE — Telephone Encounter (Signed)
Called and left a message with genetics appointments

## 2015-03-05 ENCOUNTER — Telehealth: Payer: Self-pay | Admitting: Hematology

## 2015-03-05 ENCOUNTER — Telehealth: Payer: Self-pay | Admitting: Neurology

## 2015-03-05 NOTE — Telephone Encounter (Signed)
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  Will go over results at her follow-up visit

## 2015-03-06 DIAGNOSIS — F5102 Adjustment insomnia: Secondary | ICD-10-CM | POA: Diagnosis not present

## 2015-03-06 DIAGNOSIS — M62562 Muscle wasting and atrophy, not elsewhere classified, left lower leg: Secondary | ICD-10-CM | POA: Diagnosis not present

## 2015-03-06 DIAGNOSIS — M6250 Muscle wasting and atrophy, not elsewhere classified, unspecified site: Secondary | ICD-10-CM | POA: Diagnosis not present

## 2015-03-06 DIAGNOSIS — C182 Malignant neoplasm of ascending colon: Secondary | ICD-10-CM | POA: Diagnosis not present

## 2015-03-06 DIAGNOSIS — M6281 Muscle weakness (generalized): Secondary | ICD-10-CM | POA: Diagnosis not present

## 2015-03-06 DIAGNOSIS — C189 Malignant neoplasm of colon, unspecified: Secondary | ICD-10-CM | POA: Diagnosis not present

## 2015-03-06 DIAGNOSIS — G8929 Other chronic pain: Secondary | ICD-10-CM | POA: Diagnosis not present

## 2015-03-06 DIAGNOSIS — M62552 Muscle wasting and atrophy, not elsewhere classified, left thigh: Secondary | ICD-10-CM | POA: Diagnosis not present

## 2015-03-06 DIAGNOSIS — G4701 Insomnia due to medical condition: Secondary | ICD-10-CM | POA: Diagnosis not present

## 2015-03-07 ENCOUNTER — Ambulatory Visit: Payer: Medicare Other | Admitting: Nutrition

## 2015-03-07 NOTE — Progress Notes (Signed)
54 year old female diagnosed with ovarian and colon cancer.  She is a patient of Dr. Burr Medico.  PMH includes HTN, CHI, asthma,anxiety, PTSD, and GERD.  Medications include Flagyl, Zofran, Lyrica, and Zantac.  Labs were reviewed.  Height: 67 inches. Weight: 126 pounds April 13. Usual body weight about 150 pounds. BMI: 19.73.  Patient reports she has been eating very little variety secondary to not knowing what she is able to eat. States she is only been eating some chicken and very few vegetables. She does enjoy milk and will drink boost. She has nausea but nausea medications are effective Patient denies other nutrition side effects.  Nutrition diagnosis: Food and nutrition related knowledge deficit related to new diagnosis of ovarian and colon cancer as evidenced by no prior need for nutrition related information.  Intervention: Patient educated to consume small frequent meals and snacks containing high-calorie high-protein foods to promote weight gain. Provided fact sheet on ways to increase calories and protein. Encouraged patient to consume boost or Ensure Plus as desired. Provided samples and coupons. Educated patient on strategies for eating if she develops nausea. Questions answered.  Teach back method used.  Monitoring, evaluation, goals: Patient will work to increase calories and protein to promote weight gain.  Next visit: To be scheduled as needed.

## 2015-03-08 ENCOUNTER — Ambulatory Visit (INDEPENDENT_AMBULATORY_CARE_PROVIDER_SITE_OTHER): Payer: Medicare Other | Admitting: Neurology

## 2015-03-08 ENCOUNTER — Encounter: Payer: Self-pay | Admitting: Neurology

## 2015-03-08 VITALS — Ht 67.0 in | Wt 126.0 lb

## 2015-03-08 DIAGNOSIS — G541 Lumbosacral plexus disorders: Secondary | ICD-10-CM | POA: Diagnosis not present

## 2015-03-08 DIAGNOSIS — R202 Paresthesia of skin: Secondary | ICD-10-CM | POA: Diagnosis not present

## 2015-03-08 DIAGNOSIS — C562 Malignant neoplasm of left ovary: Secondary | ICD-10-CM

## 2015-03-08 DIAGNOSIS — M79605 Pain in left leg: Secondary | ICD-10-CM

## 2015-03-08 DIAGNOSIS — C189 Malignant neoplasm of colon, unspecified: Secondary | ICD-10-CM

## 2015-03-08 MED ORDER — HYDROCODONE-ACETAMINOPHEN 7.5-500 MG PO TABS: 1.0000 | ORAL_TABLET | Freq: Four times a day (QID) | ORAL | Status: DC | PRN

## 2015-03-08 MED ORDER — PREGABALIN 100 MG PO CAPS: ORAL_CAPSULE | ORAL | Status: DC

## 2015-03-08 NOTE — Progress Notes (Signed)
PATIENT: Catherine Rogers DOB: 30-Oct-1961  HISTORICAL  Catherine Rogers is a 54 years old ambidextrous female, she is referred by her gynecologist Dr. Denman George for evaluation of left lower extremity paresthesia.  She presented with GI bleeding in October 2015, was found to have obstructive right colon, she had right hemicolectomy by Dr. 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 Denman George for exploratory laparotomy with right self single oophorectomy, pelvic, and para-aortic lymph node dissection, omentectomy, per surgical record, there was left-sided pelvic lymph node dissection, from proximally the bifurcation of the common iliac, distally the circumflex iliac vein, laterally the genitofemoral nerve, the medial border was the superior vesicle artery and the deep border was the obturator nerve.  Few minutes before she was put under for her third surgery February second 2016, she noticed intense shooting pain to her left lower extremity, woke up from surgery complains of 10 out of 10 extreme left lower extremity paresthesia and pain," I want him to take my left leg off," her left leg pain has been persistent since its onset,  Sh had extensive evaluation already,  Left lower extremity venous Doppler February third 2016 showed no evidence of DVT.  CT abdomen in Feb 16th 2016:  There are postsurgical changes post hysterectomy. Midline lower abdominal, pelvic wall scarring, there is a elongated loculated fluid collection in the right pelvic sidewall, extending along the medial aspect of iliac bone/right acetabulum. The collection measures at least 6.1 x 2.5 cm. This is highly suspicious for infected fluid or early  pelvic abscess. A second loculated fluid collection in right anterior pelvis/ mesentery axial image 49 on delayed images Measures 3.6 x 3.2 cm. Small amount of complex fluid is noted in right pelvis anteriorly just above the urinary bladder.  I have reviewed CT of lumbar, thoracic, in December 28 2014,Mild disc degeneration and moderate to severe lower lumbar facet arthrosis, worst at L4-5 where there is moderate spinal stenosis and moderate bilateral neural foraminal stenosis, No evidence of osseous metastases in the lumbar spine.  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 Small fluid posterior to the right external iliac vein, no mass lesion identified at the pelvic region  Lyrica 100 mg 3 times a day has been helpful, along with Lortab 7.5/500 mg every 4-6 hours  REVIEW OF SYSTEMS: Full 14 system review of systems performed and notable only for appetite change, chill, fever, light sensitivity, leg swelling, cold intolerance, nausea, restless leg, insomnia, achy muscles, muscle cramps, walking difficulties, anemia, dizziness, numbness, weakness, depression, anxiety   ALLERGIES: Allergies  Allergen Reactions  . Other Hives and Itching    Highly acidic foods "make me itch and break out" oranges, tomatoes  . Demerol [Meperidine] Nausea And Vomiting  . Ibuprofen Nausea And Vomiting  .  Morphine And Related Nausea And Vomiting  . Oxycodone Itching  . Penicillins Other (See  Comments)    Childhood allergy  . Tylenol [Acetaminophen] Nausea And Vomiting    HOME MEDICATIONS: Current Outpatient Prescriptions  Medication Sig Dispense Refill  . docusate sodium 100 MG CAPS Take 100 mg by mouth 2 (two) times daily as needed for mild constipation. (Patient taking differently: Take 100 mg by mouth 2 (two) times daily as needed (constipation). ) 10 capsule 0  . ferrous sulfate 325 (65 FE) MG tablet Take 1 tablet (325 mg total) by mouth 2 (two) times daily with a meal. 60 tablet 3  . gabapentin (NEURONTIN) 100 MG capsule Take 1 capsule (100 mg total) by mouth 2 (two) times daily. 60 capsule 1  . hydrochlorothiazide (HYDRODIURIL) 25 MG tablet Take 25 mg by mouth every morning.     [name] HYDROcodone-acetaminophen (NORCO) 5-325 MG per tablet Take 1-2 tablets by mouth every 4 (four) hours as needed. 40 tablet 0  . methocarbamol (ROBAXIN) 500 MG tablet Take 500 mg by mouth every 8 (eight) hours as needed for muscle spasms.     . ondansetron (ZOFRAN) 8 MG tablet Take 1 tablet (8 mg total) by mouth every 8 (eight) hours as needed for nausea or vomiting. 20 tablet 3  . ranitidine (ZANTAC) 150 MG tablet Take 150 mg by mouth 2 (two) times daily as needed for heartburn.        PAST MEDICAL HISTORY: Past Medical History  Diagnosis Date  . Hypertension   . Ectopic pregnancy   . Head injury, closed, with brief LOC     had a cut- as a child  . Asthma   . Anxiety   . Depression   . PTSD (post-traumatic stress disorder)   . GERD (gastroesophageal reflux disease)   . Arthritis   . Pelvic cyst   . Complication of anesthesia     woke and saw long tube coming out of mouth . sat up then went back to sleep  . PONV (postoperative nausea and vomiting)   . Family history of adverse reaction to anesthesia     sister on dialysis- hypotension   . Headache   . Cancer     colon and ovarian  . Anemia   . History of blood transfusion     2005 approximately     PAST SURGICAL HISTORY: Past  Surgical History  Procedure Laterality Date  . Esophagogastroduodenoscopy N/A 09/17/2014    Procedure: ESOPHAGOGASTRODUODENOSCOPY (EGD);  Surgeon: Jeryl Columbia, MD;  Location[facility];  Service: Endoscopy;  Laterality: N/A;  . Abdominal hysterectomy    . Tubal ligation    . Partial colectomy Right 09/18/2014    Procedure: RIGHT HEMI COLECTOMY;  Surgeon: Doreen Salvage, MD;  Location: [place];  Service: General;  Laterality: Right;  . Ileostomy Right 09/18/2014    Procedure: ILEOCOLOSTOMY;  Surgeon: Doreen Salvage, MD;  Location: [place];  Service: General;  Laterality: Right;  . Laparotomy N/A 11/01/2014    Procedure: EXPLORATORY LAPAROTOMY;  Surgeon: Doreen Salvage, MD;  Location: [place];  Service: General;  Laterality: N/A;  . Mass excision  11/01/2014    Procedure: EXCISION PELVIC MASS;  Surgeon: Doreen Salvage, MD;  Location: [place];  Service: General;;  . Robotic assisted total hysterectomy with bilateral salpingo oopherectomy Right 12/26/2014    Procedure: EXPLORATORY LAPAROSCOPY  RIGHT SALPINGO OOPHORECTOMY OMENTECTOMY LYMPHADECTOMY ;  Surgeon: Everitt Amber, MD;  Location: [facility];  Service: Gynecology;  Laterality: Right;  .  Laparotomy N/A 12/26/2014    Procedure:  LAPAROTOMY ;  Surgeon: Everitt Amber, MD;  Location: [facility];  Service: Gynecology;  Laterality: N/A;  . Oophorectomy  2016    FAMILY HISTORY: Family History  Problem Relation Age of Onset  . Cancer Sister 65    brain cancer   . Cancer Brother 66    lung cancer   . Heart attack Mother   . Heart attack Father     53s    SOCIAL HISTORY:  History   Social History  . Marital Status: Widowed    Spouse Name: N/A  . Number of Children: 6  . Years of Education: 13   Occupational History  . Disability    Social History Main Topics  . Smoking status: Never Smoker   . Smokeless tobacco: Never Used     Comment: pack per month of cigars   . Alcohol Use: 4.2 oz/week    7 Standard drinks or equivalent per week     Comment: socially   . Drug  Use: Yes    Special: Marijuana     Comment: smokes marijuana every 2-3 days, last use in january    . Sexual Activity: No   Other Topics Concern  . Not on file   Social History Narrative   Born and raised in [place], [place]. Currently resides in a house by herself. No pets. Fun: Read, shoot pool.    Denies religious beliefs that would effect health care.    Lives at home alone.   Right-handed.   No caffeine use.   PHYSICAL EXAM   Filed Vitals:   03/08/15 0958  Height: 5\' 7"  (1.702 m)  Weight: 126 lb (57.153 kg)    Not recorded      Body mass index is 19.73 kg/(m^2).  PHYSICAL EXAMNIATION:  Gen: NAD, conversant, well nourised, obese, well groomed                     Cardiovascular: Regular rate rhythm, no peripheral edema, warm, nontender. Eyes: Conjunctivae clear without exudates or hemorrhage Neck: Supple, no carotid bruise. Pulmonary: Clear to auscultation bilaterally   NEUROLOGICAL EXAM:  MENTAL STATUS: Speech:    Speech is normal; fluent and spontaneous with normal comprehension.  Cognition:    The patient is oriented to person, place, and time;     recent and remote memory intact;     language fluent;     normal attention, concentration,     fund of knowledge.  CRANIAL NERVES: CN II: Visual fields are full to confrontation. Fundoscopic exam is normal with sharp discs and no vascular changes. Venous pulsations are present bilaterally. Pupils are 4 mm and briskly reactive to light. Visual acuity is 20/20 bilaterally. CN III, IV, VI: extraocular movement are normal. No ptosis. CN V: Facial sensation is intact to pinprick in all 3 divisions bilaterally. Corneal responses are intact.  CN VII: Face is symmetric with normal eye closure and smile. CN VIII: Hearing is normal to rubbing fingers CN IX, X: Palate elevates symmetrically. Phonation is normal. CN XI: Head turning and shoulder shrug are intact CN XII: Tongue is midline with normal movements and no  atrophy.  MOTOR: She has left foot swelling, tenderness upon deep palpation, limitation of motor strengths evaluation because of the pain, mild left ankle dorsi flexion weakness, bilateral upper extremity, right leg proximal and distal strength was normal   REFLEXES: Reflexes are 2+ and symmetric at the biceps, triceps, knees,  and ankles. Plantar responses are flexor.  SENSORY:Hypersensitivity to light touch, pinprick at left distal leg, involving top, bottom of left foot, extending to distal area above ankle level, preserved   left toe position sense, and vibration sense .  COORDINATION: Rapid alternating movements and fine finger movements are intact. There is no dysmetria on finger-to-nose and heel-knee-shin. There are no abnormal or extraneous movements.   GAIT/STANCE: Antalgic gait, due to left leg pain,  Romberg is absent.   DIAGNOSTIC DATA (LABS, IMAGING, TESTING) - I reviewed patient records, labs, notes, testing and imaging myself where available.  Lab Results  Component Value Date   WBC 6.3 02/09/2015   HGB 12.9 02/09/2015   HCT 37.0 02/09/2015   MCV 100.8 02/09/2015   PLT 186 02/09/2015      Component Value Date/Time   NA 139 02/09/2015 1245   NA 138 01/11/2015 1116   K 4.0 02/09/2015 1245   K 3.3* 01/11/2015 1116   CL 102 01/11/2015 1116   CO2 27 02/09/2015 1245   CO2 25 01/11/2015 1116   GLUCOSE 103 02/09/2015 1245   GLUCOSE 100* 01/11/2015 1116   BUN 18.4 02/09/2015 1245   BUN 17 01/11/2015 1116   CREATININE 1.0 02/09/2015 1245   CREATININE 0.66 01/11/2015 1116   CALCIUM 9.7 02/09/2015 1245   CALCIUM 9.6 01/11/2015 1116   PROT 7.9 02/09/2015 1245   PROT 7.4 12/22/2014 1425   ALBUMIN 3.8 02/09/2015 1245   ALBUMIN 4.3 12/22/2014 1425   AST 21 02/09/2015 1245   AST 22 12/22/2014 1425   ALT 14 02/09/2015 1245   ALT 16 12/22/2014 1425   ALKPHOS 76 02/09/2015 1245   ALKPHOS 79 12/22/2014 1425   BILITOT 0.55 02/09/2015 1245   BILITOT 0.6 12/22/2014  1425   GFRNONAA >90 01/11/2015 1116   GFRAA >90 01/11/2015 1116    ASSESSMENT AND PLAN  Catherine Rogers is a 54 y.o. femalecomplains of left lower extremity pain, since her left pelvic surgery in February second 2016, her most severe pain was at left foot, left foot swelling, tenderness upon deep palpitation, difficult to examine muscle strength, only mild left ankle dorsi flexion weakness, radiating pain to left lower extremity with deep palpation of left pelvic region, MRI of pelvic region showed avascular necrosis of left hip  Differentiation diagnosis includes left lumbosacral plexopathy , left lower extremity musculoskeletal etiology,  Increase Lyrica to 100 mg 2 tablets 3 times a day, Lortab 7.5/500 mg every 6 hours as needed EMG nerve conduction study X-ray of left femur, knee, tibial, foot   Marcial Pacas, M.D. Ph.D.  [facility] [address] [place], [place] [address] Ph: [phone] Fax: [phone]

## 2015-03-12 ENCOUNTER — Telehealth: Payer: Self-pay | Admitting: Neurology

## 2015-03-12 NOTE — Telephone Encounter (Signed)
Pt is calling stating she is having trouble getting her Rx for pregabalin (LYRICA) 100 MG capsule at [facility].  Please call and advise.

## 2015-03-12 NOTE — Telephone Encounter (Signed)
I called the pharmacy.  They said a prior Josem Kaufmann is needed.  I contacted [employer] ins and provided clinical info.  Request has been approved effective until 11/24/2015 Ref # [id number].  I called the pharmacy back.  They processed claim without any issues.  I called the patient back.  She is aware.

## 2015-03-14 ENCOUNTER — Encounter: Payer: Self-pay | Admitting: Genetic Counselor

## 2015-03-14 ENCOUNTER — Encounter: Payer: Self-pay | Admitting: Nutrition

## 2015-03-14 ENCOUNTER — Ambulatory Visit (HOSPITAL_BASED_OUTPATIENT_CLINIC_OR_DEPARTMENT_OTHER): Payer: Medicare Other | Admitting: Genetic Counselor

## 2015-03-14 ENCOUNTER — Other Ambulatory Visit (HOSPITAL_BASED_OUTPATIENT_CLINIC_OR_DEPARTMENT_OTHER): Payer: Medicare Other

## 2015-03-14 DIAGNOSIS — C562 Malignant neoplasm of left ovary: Secondary | ICD-10-CM | POA: Diagnosis not present

## 2015-03-14 DIAGNOSIS — Z808 Family history of malignant neoplasm of other organs or systems: Secondary | ICD-10-CM | POA: Diagnosis not present

## 2015-03-14 DIAGNOSIS — Z8041 Family history of malignant neoplasm of ovary: Secondary | ICD-10-CM

## 2015-03-14 DIAGNOSIS — Z315 Encounter for genetic counseling: Secondary | ICD-10-CM | POA: Diagnosis not present

## 2015-03-14 DIAGNOSIS — C189 Malignant neoplasm of colon, unspecified: Secondary | ICD-10-CM | POA: Diagnosis not present

## 2015-03-14 DIAGNOSIS — Z801 Family history of malignant neoplasm of trachea, bronchus and lung: Secondary | ICD-10-CM

## 2015-03-14 DIAGNOSIS — C182 Malignant neoplasm of ascending colon: Secondary | ICD-10-CM

## 2015-03-14 DIAGNOSIS — C569 Malignant neoplasm of unspecified ovary: Secondary | ICD-10-CM | POA: Diagnosis not present

## 2015-03-14 LAB — CBC WITH DIFFERENTIAL/PLATELET
BASO%: 0.7 % (ref 0.0–2.0)
BASOS ABS: 0 10*3/uL (ref 0.0–0.1)
EOS%: 0.6 % (ref 0.0–7.0)
Eosinophils Absolute: 0 10*3/uL (ref 0.0–0.5)
HEMATOCRIT: 43.2 % (ref 34.8–46.6)
HEMOGLOBIN: 14.7 g/dL (ref 11.6–15.9)
LYMPH%: 44.2 % (ref 14.0–49.7)
MCH: 34.7 pg — ABNORMAL HIGH (ref 25.1–34.0)
MCHC: 34 g/dL (ref 31.5–36.0)
MCV: 101.9 fL — AB (ref 79.5–101.0)
MONO#: 0.4 10*3/uL (ref 0.1–0.9)
MONO%: 6.5 % (ref 0.0–14.0)
NEUT#: 3.1 10*3/uL (ref 1.5–6.5)
NEUT%: 48 % (ref 38.4–76.8)
Platelets: 204 10*3/uL (ref 145–400)
RBC: 4.24 10*6/uL (ref 3.70–5.45)
RDW: 13.8 % (ref 11.2–14.5)
WBC: 6.4 10*3/uL (ref 3.9–10.3)
lymph#: 2.8 10*3/uL (ref 0.9–3.3)

## 2015-03-14 LAB — COMPREHENSIVE METABOLIC PANEL (CC13)
ALT: 19 U/L (ref 0–55)
AST: 24 U/L (ref 5–34)
Albumin: 4.3 g/dL (ref 3.5–5.0)
Alkaline Phosphatase: 72 U/L (ref 40–150)
Anion Gap: 13 mEq/L — ABNORMAL HIGH (ref 3–11)
BUN: 17.8 mg/dL (ref 7.0–26.0)
CALCIUM: 10 mg/dL (ref 8.4–10.4)
CO2: 23 meq/L (ref 22–29)
Chloride: 106 mEq/L (ref 98–109)
Creatinine: 0.8 mg/dL (ref 0.6–1.1)
EGFR: >90 mL/min/{1.73_m2} (ref >90)
GLUCOSE: 86 mg/dL (ref 70–140)
POTASSIUM: 3.8 meq/L (ref 3.5–5.1)
SODIUM: 143 meq/L (ref 136–145)
TOTAL PROTEIN: 8.1 g/dL (ref 6.4–8.3)
Total Bilirubin: 0.44 mg/dL (ref 0.20–1.20)

## 2015-03-14 NOTE — Progress Notes (Signed)
REFERRING PROVIDER: Golden Circle, FNP [place], [place] [address]   Truitt Merle, MD  PRIMARY PROVIDER:  Mauricio Po, FNP  PRIMARY REASON FOR VISIT:  1. Adenocarcinoma of colon   2. 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   3. Family history of ovarian cancer      HISTORY OF PRESENT ILLNESS:   Catherine Rogers, a 54 y.o. female, was seen for a [facility] cancer genetics consultation at the request of Dr. Burr Medico due to a personal and family history of cancer.  Catherine Rogers presents to clinic today to discuss the possibility of a hereditary predisposition to cancer, genetic testing, and to further clarify her future cancer risks, as well as potential cancer risks for family members.   In 2015, at the age of 36, Catherine Rogers was diagnosed with adenocarcinoma of the right colon.  At the same time she was diagnosed with ovarian cancer. This was treated with surgery and she is awaiting the start of chemotherapy.  These were found after being treated for a yeast infection.  She ended up in the ER with rectal bleeding, and a mass was found.   CANCER HISTORY:    Adenocarcinoma of colon   09/16/2014 Imaging CT abd/pel:  a 3.8cm cecal mass with associated ileocecal intussusception, and a 9.1cm solid and cystic mass in the left pelvis,   09/18/2014 Initial Diagnosis Adenocarcinoma of right colon   09/18/2014 Pathologic Stage pT3pN1Mx, tumor extends into pericolonic soft tissue and is less than 34m from the serosal surface, LVI 8-), PNI (-), all 23 node negative, one soft tissue tumor deposit, surgical margins negative.    09/18/2014 Surgery right hemicolectomy and terminal ileoectomy    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   10/09/2014 Imaging CT of the abdomen and pelvis showed a 9.1 cm solid and cystic mass in the left pelvis concerning for malignancy. This was discovered during her workup for colon cancer.   11/01/2014 Initial Diagnosis 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   11/01/2014 Surgery Salpingo-oophorectomy.   11/01/2014  Pathologic Stage mixed endometrioid and clear cell carcinoma, FIGO grade 3, over ovarian primary. Focal fallopian tube carcinoma in situ. Tumor size 3.7 cm, no lymph nodes removed. T1cNx. Tumor cells are positive for cytokeratin 7 and estrogen receptor.     HORMONAL RISK FACTORS:  Menarche was at age 54  First live birth at age 54  OCP use for approximately 2 years.  Ovaries intact: no.  Hysterectomy: yes.  Menopausal status: postmenopausal.  HRT use: 0 years. Colonoscopy: yes; abnormal. Mammogram within the last year: She has not had a mammogram since 2006. Number of breast biopsies: 0. Up to date with pelvic exams:  yes. Any excessive radiation exposure in the past:  no  Past Medical History  Diagnosis Date  . Hypertension   . Ectopic pregnancy   . Head injury, closed, with brief LOC     had a cut- as a child  . Asthma   . Anxiety   . Depression   . PTSD (post-traumatic stress disorder)   . GERD (gastroesophageal reflux disease)   . Arthritis   . Pelvic cyst   . Complication of anesthesia     woke and saw long tube coming out of mouth . sat up then went back to sleep  . PONV (postoperative nausea and vomiting)   . Family history of adverse reaction to anesthesia     sister on dialysis- hypotension   . Headache   . Cancer     colon  and ovarian  . Anemia   . History of blood transfusion     2005 approximately   . Family history of ovarian cancer     Past Surgical History  Procedure Laterality Date  . Esophagogastroduodenoscopy N/A 09/17/2014    Procedure: ESOPHAGOGASTRODUODENOSCOPY (EGD);  Surgeon: Jeryl Columbia, MD;  Location: [facility] ENDOSCOPY;  Service: Endoscopy;  Laterality: N/A;  . Abdominal hysterectomy    . Tubal ligation    . Partial colectomy Right 09/18/2014    Procedure: RIGHT HEMI COLECTOMY;  Surgeon: Doreen Salvage, MD;  Location: [place];  Service: General;  Laterality: Right;  . Ileostomy Right 09/18/2014    Procedure: ILEOCOLOSTOMY;  Surgeon: Doreen Salvage, MD;   Location: [place];  Service: General;  Laterality: Right;  . Laparotomy N/A 11/01/2014    Procedure: EXPLORATORY LAPAROTOMY;  Surgeon: Doreen Salvage, MD;  Location: [place];  Service: General;  Laterality: N/A;  . Mass excision  11/01/2014    Procedure: EXCISION PELVIC MASS;  Surgeon: Doreen Salvage, MD;  Location: [place];  Service: General;;  . Robotic assisted total hysterectomy with bilateral salpingo oopherectomy Right 12/26/2014    Procedure: EXPLORATORY LAPAROSCOPY  RIGHT SALPINGO OOPHORECTOMY OMENTECTOMY LYMPHADECTOMY ;  Surgeon: Everitt Amber, MD;  Location: [facility];  Service: Gynecology;  Laterality: Right;  . Laparotomy N/A 12/26/2014    Procedure:  LAPAROTOMY ;  Surgeon: Everitt Amber, MD;  Location: [facility];  Service: Gynecology;  Laterality: N/A;  . Oophorectomy  2016    History   Social History  . Marital Status: Widowed    Spouse Name: N/A  . Number of Children: 6  . Years of Education: 13   Occupational History  . Disability    Social History Main Topics  . Smoking status: Never Smoker   . Smokeless tobacco: Never Used     Comment: pack per month of cigars   . Alcohol Use: 4.2 oz/week    7 Standard drinks or equivalent per week     Comment: socially   . Drug Use: Yes    Special: Marijuana     Comment: smokes marijuana every 2-3 days, last use in january    . Sexual Activity: No   Other Topics Concern  . None   Social History Narrative   Born and raised in [place], [place]. Currently resides in a house by herself. No pets. Fun: Read, shoot pool.    Denies religious beliefs that would effect health care.    Lives at home alone.   Right-handed.   No caffeine use.     FAMILY HISTORY:  We obtained a detailed, 4-generation family history.  Significant diagnoses are listed below: Family History  Problem Relation Age of Onset  . Brain cancer Sister 74  . Lung cancer Brother 87  . Heart attack Mother   . Heart attack Father     72s  . Heart attack Maternal Uncle   . Diabetes  Maternal Uncle   . Ovarian cancer Paternal Aunt     3 paternal aunts with ovarian cancer  . Diabetes Paternal Uncle   . Heart attack Brother    The patient had one sister and six brothers.  One brother had lung cancer, two died of heart attacks and her sister has a brain tumor.  The maternal side is non-contributory.  Her father had 70 brothers and 3 sisters.  All three sisters had ovarian cancer. Patient's maternal ancestors are of [place] American descent, and paternal ancestors are of [place] American  descent. There is no reported Ashkenazi Jewish ancestry. There is no known consanguinity.  GENETIC COUNSELING ASSESSMENT: Arthur Aydelotte is a 54 y.o. female with a personal and family hsitory of cancer which somewhat suggestive of a hereditayr cancer syndrome and predisposition to cancer. We, therefore, discussed and recommended the following at today's visit.   DISCUSSION: We reviewed the characteristics, features and inheritance patterns of hereditary cancer syndromes. Based on the personal history of cancer and family history, we discussed Lynch syndrome as well as other hereditary ovarian cancer syndromes, including BRCA. WE discussed that is is unlikely, based on the family history of cancer, that there is a BRCA mutation in the family.  We also discussed genetic testing, including the appropriate family members to test, the process of testing, insurance coverage and turn-around-time for results. We discussed the implications of a negative, positive and/or variant of uncertain significant result. We recommended Catherine Rogers pursue genetic testing for the CancerNext gene panel. The CancerNext gene panel offered by [employer] includes sequencing and rearrangement analysis for the following 32 genes:   APC, ATM, BARD1, BMPR1A, BRCA1, BRCA2, BRIP1, CDH1, CDK4, CDKN2A, CHEK2, EPCAM, GREM1, MLH1, MRE11A, MSH2, MSH6, MUTYH, NBN, NF1, PALB2, PMS2, POLD1, POLE, PTEN, RAD50, RAD51D, SMAD4, SMARCA4, STK11,  and TP53.    Catherine Rogers is feeling very overwhelmed by her diagnosis and the possibility that her cancer could be hereditary.  We discussed that while this information can be overwhelming to hear, it also provides some control as she can change her medical management, as well as try to identify other family members who could be at risk.  We also discussed that testing can be put off until later, after she has completed treatment, if she would rather focus on healing from surgery and treatment.  She indicated that she did not want to wait, but instead wants to proceed with testing.  PLAN: After considering the risks, benefits, and limitations, Catherine Rogers  provided informed consent to pursue genetic testing and the blood sample was sent to [facility] for analysis of the [place]. Results should be available within approximately 3 weeks' time, at which point they will be disclosed by telephone to Catherine Rogers, as will any additional recommendations warranted by these results. Catherine Rogers will receive a summary of her genetic counseling visit and a copy of her results once available. This information will also be available in Epic. We encouraged Catherine Rogers to remain in contact with cancer genetics annually so that we can continuously update the family history and inform her of any changes in cancer genetics and testing that may be of benefit for her family. Catherine Rogers questions were answered to her satisfaction today. Our contact information was provided should additional questions or concerns arise.  Lastly, we encouraged Catherine Rogers to remain in contact with cancer genetics annually so that we can continuously update the family history and inform her of any changes in cancer genetics and testing that may be of benefit for this family.   Ms.  Rogers questions were answered to her satisfaction today. Our contact information was provided should additional questions or concerns arise. Thank you for the  referral and allowing [place] to share in the care of your patient.   Karen P. Florene Glen, [place], [facility] Certified Genetic Counselor [email or web address] phone: [phone]  The patient was seen for a total of 60 minutes in face-to-face genetic counseling.  This patient was discussed with Drs. Magrinat, Lindi Adie and/or Burr Medico who agrees with the above.  _______________________________________________________________________ For Office Staff:  Number of people involved in session: 1 Was an Intern/ student involved with case: no

## 2015-03-14 NOTE — Progress Notes (Signed)
Provided one complimentary case of Ensure Plus 

## 2015-03-15 LAB — CEA: CEA: 0.7 ng/mL (ref 0.0–5.0)

## 2015-03-15 LAB — CA 125: CA 125: 8 U/mL (ref <35)

## 2015-03-20 ENCOUNTER — Ambulatory Visit (INDEPENDENT_AMBULATORY_CARE_PROVIDER_SITE_OTHER): Payer: Medicare Other | Admitting: Neurology

## 2015-03-20 ENCOUNTER — Ambulatory Visit (INDEPENDENT_AMBULATORY_CARE_PROVIDER_SITE_OTHER): Payer: Self-pay | Admitting: Neurology

## 2015-03-20 DIAGNOSIS — R202 Paresthesia of skin: Secondary | ICD-10-CM

## 2015-03-20 DIAGNOSIS — M79605 Pain in left leg: Secondary | ICD-10-CM

## 2015-03-20 DIAGNOSIS — C562 Malignant neoplasm of left ovary: Secondary | ICD-10-CM | POA: Diagnosis not present

## 2015-03-20 DIAGNOSIS — G541 Lumbosacral plexus disorders: Secondary | ICD-10-CM

## 2015-03-20 DIAGNOSIS — Z0289 Encounter for other administrative examinations: Secondary | ICD-10-CM

## 2015-03-20 MED ORDER — FENTANYL 50 MCG/HR TD PT72: 50.0000 ug | MEDICATED_PATCH | TRANSDERMAL | Status: DC

## 2015-03-20 NOTE — Progress Notes (Signed)
PATIENT: Catherine Rogers DOB: 30-Oct-1961  HISTORICAL  Catherine Rogers is a 54 years old ambidextrous female, she is referred by her gynecologist Dr. Denman George for evaluation of left lower extremity paresthesia.  She presented with GI bleeding in October 2015, was found to have obstructive right colon, she had right hemicolectomy by Dr. 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 Denman George for exploratory laparotomy with right self single oophorectomy, pelvic, and para-aortic lymph node dissection, omentectomy, per surgical record, there was left-sided pelvic lymph node dissection, from proximally the bifurcation of the common iliac, distally the circumflex iliac vein, laterally the genitofemoral nerve, the medial border was the superior vesicle artery and the deep border was the obturator nerve.  Few minutes before she was put under for her third surgery February second 2016, she noticed intense shooting pain to her left lower extremity, woke up from surgery complains of 10 out of 10 extreme left lower extremity paresthesia and pain," I want him to take my left leg off," her left leg pain has been persistent since its onset,  Sh had extensive evaluation already,  Left lower extremity venous Doppler February third 2016 showed no evidence of DVT.  CT abdomen in Feb 16th 2016:  There are postsurgical changes post hysterectomy. Midline lower abdominal, pelvic wall scarring, there is a elongated loculated fluid collection in the right pelvic sidewall, extending along the medial aspect of iliac bone/right acetabulum. The collection measures at least 6.1 x 2.5 cm. This is highly suspicious for infected fluid or early  pelvic abscess. A second loculated fluid collection in right anterior pelvis/ mesentery axial image 49 on delayed images Measures 3.6 x 3.2 cm. Small amount of complex fluid is noted in right pelvis anteriorly just above the urinary bladder.  I have reviewed CT of lumbar, thoracic, in December 28 2014,Mild disc degeneration and moderate to severe lower lumbar facet arthrosis, worst at L4-5 where there is moderate spinal stenosis and moderate bilateral neural foraminal stenosis, No evidence of osseous metastases in the lumbar spine.  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 Small fluid posterior to the right external iliac vein, no mass lesion identified at the pelvic region  Lyrica 100 mg 3 times a day has been helpful, along with Lortab 7.5/500 mg every 4-6 hours  REVIEW OF SYSTEMS: Full 14 system review of systems performed and notable only for appetite change, chill, fever, light sensitivity, leg swelling, cold intolerance, nausea, restless leg, insomnia, achy muscles, muscle cramps, walking difficulties, anemia, dizziness, numbness, weakness, depression, anxiety   ALLERGIES: Allergies  Allergen Reactions  . Other Hives and Itching    Highly acidic foods "make me itch and break out" oranges, tomatoes  . Demerol [Meperidine] Nausea And Vomiting  . Ibuprofen Nausea And Vomiting  .  Morphine And Related Nausea And Vomiting  . Oxycodone Itching  . Penicillins Other (See  Comments)    Childhood allergy  . Tylenol [Acetaminophen] Nausea And Vomiting    HOME MEDICATIONS: Current Outpatient Prescriptions  Medication Sig Dispense Refill  . docusate sodium 100 MG CAPS Take 100 mg by mouth 2 (two) times daily as needed for mild constipation. (Patient taking differently: Take 100 mg by mouth 2 (two) times daily as needed (constipation). ) 10 capsule 0  . ferrous sulfate 325 (65 FE) MG tablet Take 1 tablet (325 mg total) by mouth 2 (two) times daily with a meal. 60 tablet 3  . gabapentin (NEURONTIN) 100 MG capsule Take 1 capsule (100 mg total) by mouth 2 (two) times daily. 60 capsule 1  . hydrochlorothiazide (HYDRODIURIL) 25 MG tablet Take 25 mg by mouth every morning.     [name] HYDROcodone-acetaminophen (NORCO) 5-325 MG per tablet Take 1-2 tablets by mouth every 4 (four) hours as needed. 40 tablet 0  . methocarbamol (ROBAXIN) 500 MG tablet Take 500 mg by mouth every 8 (eight) hours as needed for muscle spasms.     . ondansetron (ZOFRAN) 8 MG tablet Take 1 tablet (8 mg total) by mouth every 8 (eight) hours as needed for nausea or vomiting. 20 tablet 3  . ranitidine (ZANTAC) 150 MG tablet Take 150 mg by mouth 2 (two) times daily as needed for heartburn.        PAST MEDICAL HISTORY: Past Medical History  Diagnosis Date  . Hypertension   . Ectopic pregnancy   . Head injury, closed, with brief LOC     had a cut- as a child  . Asthma   . Anxiety   . Depression   . PTSD (post-traumatic stress disorder)   . GERD (gastroesophageal reflux disease)   . Arthritis   . Pelvic cyst   . Complication of anesthesia     woke and saw long tube coming out of mouth . sat up then went back to sleep  . PONV (postoperative nausea and vomiting)   . Family history of adverse reaction to anesthesia     sister on dialysis- hypotension   . Headache   . Cancer     colon and ovarian  . Anemia   . History of blood transfusion     2005 approximately   . Family history of ovarian  cancer     PAST SURGICAL HISTORY: Past Surgical History  Procedure Laterality Date  . Esophagogastroduodenoscopy N/A 09/17/2014    Procedure: ESOPHAGOGASTRODUODENOSCOPY (EGD);  Surgeon: Jeryl Columbia, MD;  Location: [facility] ENDOSCOPY;  Service: Endoscopy;  Laterality: N/A;  . Abdominal hysterectomy    . Tubal ligation    . Partial colectomy Right 09/18/2014    Procedure: RIGHT HEMI COLECTOMY;  Surgeon: Doreen Salvage, MD;  Location: [facility];  Service: General;  Laterality: Right;  . Ileostomy Right 09/18/2014    Procedure: ILEOCOLOSTOMY;  Surgeon: Doreen Salvage, MD;  Location: [facility];  Service: General;  Laterality: Right;  . Laparotomy N/A 11/01/2014    Procedure: EXPLORATORY LAPAROTOMY;  Surgeon: Doreen Salvage, MD;  Location: [facility];  Service: General;  Laterality: N/A;  . Mass excision  11/01/2014    Procedure: EXCISION PELVIC MASS;  Surgeon: Doreen Salvage, MD;  Location: [facility];  Service: General;;  . Robotic assisted total hysterectomy with bilateral salpingo oopherectomy Right 12/26/2014    Procedure: EXPLORATORY LAPAROSCOPY  RIGHT SALPINGO OOPHORECTOMY OMENTECTOMY LYMPHADECTOMY ;  Surgeon: Everitt Amber, MD;  Location:  [facility] ORS;  Service: Gynecology;  Laterality: Right;  . Laparotomy N/A 12/26/2014    Procedure:  LAPAROTOMY ;  Surgeon: Everitt Amber, MD;  Location: [facility];  Service: Gynecology;  Laterality: N/A;  . Oophorectomy  2016    FAMILY HISTORY: Family History  Problem Relation Age of Onset  . Brain cancer Sister 77  . Lung cancer Brother 43  . Heart attack Mother   . Heart attack Father     32s  . Heart attack Maternal Uncle   . Diabetes Maternal Uncle   . Ovarian cancer Paternal Aunt     3 paternal aunts with ovarian cancer  . Diabetes Paternal Uncle   . Heart attack Brother     SOCIAL HISTORY:  History   Social History  . Marital Status: Widowed    Spouse Name: N/A  . Number of Children: 6  . Years of Education: 13   Occupational History  . Disability    Social History Main Topics  .  Smoking status: Never Smoker   . Smokeless tobacco: Never Used     Comment: pack per month of cigars   . Alcohol Use: 4.2 oz/week    7 Standard drinks or equivalent per week     Comment: socially   . Drug Use: Yes    Special: Marijuana     Comment: smokes marijuana every 2-3 days, last use in january    . Sexual Activity: No   Other Topics Concern  . Not on file   Social History Narrative   Born and raised in [place], [place]. Currently resides in a house by herself. No pets. Fun: Read, shoot pool.    Denies religious beliefs that would effect health care.    Lives at home alone.   Right-handed.   No caffeine use.   PHYSICAL EXAM   There were no vitals filed for this visit.  Not recorded      There is no weight on file to calculate BMI.  PHYSICAL EXAMNIATION:  Gen: NAD, conversant, well nourised, obese, well groomed                     Cardiovascular: Regular rate rhythm, no peripheral edema, warm, nontender. Eyes: Conjunctivae clear without exudates or hemorrhage Neck: Supple, no carotid bruise. Pulmonary: Clear to auscultation bilaterally   NEUROLOGICAL EXAM:  MENTAL STATUS: Speech:    Speech is normal; fluent and spontaneous with normal comprehension.  Cognition:    The patient is oriented to person, place, and time;     recent and remote memory intact;     language fluent;     normal attention, concentration,     fund of knowledge.  CRANIAL NERVES: CN II: Visual fields are full to confrontation. Fundoscopic exam is normal with sharp discs and no vascular changes. Venous pulsations are present bilaterally. Pupils are 4 mm and briskly reactive to light. Visual acuity is 20/20 bilaterally. CN III, IV, VI: extraocular movement are normal. No ptosis. CN V: Facial sensation is intact to pinprick in all 3 divisions bilaterally. Corneal responses are intact.  CN VII: Face is symmetric with normal eye closure and smile. CN VIII: Hearing is normal to rubbing  fingers CN IX, X: Palate elevates symmetrically. Phonation is normal. CN XI: Head turning and shoulder shrug are intact CN XII: Tongue is midline with normal movements and no atrophy.  MOTOR: She has left foot swelling, tenderness upon deep palpation, limitation of motor strengths evaluation because of  the pain, mild left ankle dorsi flexion weakness, bilateral upper extremity, right leg proximal and distal strength was normal   REFLEXES: Reflexes are 2+ and symmetric at the biceps, triceps, knees, and ankles. Plantar responses are flexor.  SENSORY:Hypersensitivity to light touch, pinprick at left distal leg, involving top, bottom of left foot, extending to distal area above ankle level, preserved   left toe position sense, and vibration sense .  COORDINATION: Rapid alternating movements and fine finger movements are intact. There is no dysmetria on finger-to-nose and heel-knee-shin. There are no abnormal or extraneous movements.   GAIT/STANCE: Antalgic gait, due to left leg pain,  Romberg is absent.   DIAGNOSTIC DATA (LABS, IMAGING, TESTING) - I reviewed patient records, labs, notes, testing and imaging myself where available.  Lab Results  Component Value Date   WBC 6.4 03/14/2015   HGB 14.7 03/14/2015   HCT 43.2 03/14/2015   MCV 101.9* 03/14/2015   PLT 204 03/14/2015      Component Value Date/Time   NA 143 03/14/2015 1105   NA 138 01/11/2015 1116   K 3.8 03/14/2015 1105   K 3.3* 01/11/2015 1116   CL 102 01/11/2015 1116   CO2 23 03/14/2015 1105   CO2 25 01/11/2015 1116   GLUCOSE 86 03/14/2015 1105   GLUCOSE 100* 01/11/2015 1116   BUN 17.8 03/14/2015 1105   BUN 17 01/11/2015 1116   CREATININE 0.8 03/14/2015 1105   CREATININE 0.66 01/11/2015 1116   CALCIUM 10.0 03/14/2015 1105   CALCIUM 9.6 01/11/2015 1116   PROT 8.1 03/14/2015 1105   PROT 7.4 12/22/2014 1425   ALBUMIN 4.3 03/14/2015 1105   ALBUMIN 4.3 12/22/2014 1425   AST 24 03/14/2015 1105   AST 22 12/22/2014  1425   ALT 19 03/14/2015 1105   ALT 16 12/22/2014 1425   ALKPHOS 72 03/14/2015 1105   ALKPHOS 79 12/22/2014 1425   BILITOT 0.44 03/14/2015 1105   BILITOT 0.6 12/22/2014 1425   GFRNONAA >90 01/11/2015 1116   GFRAA >90 01/11/2015 1116    ASSESSMENT AND PLAN  Catherine Rogers is a 54 y.o. femalecomplains of left lower extremity pain, since her left pelvic surgery in February second 2016, her most severe pain was at left foot, left foot swelling, tenderness upon deep palpitation, difficult to examine muscle strength, only mild left ankle dorsi flexion weakness, radiating pain to left lower extremity with deep palpation of left pelvic region, MRI of pelvic region showed avascular necrosis of left hip  Differentiation diagnosis includes left lumbosacral plexopathy , left lower extremity musculoskeletal etiology,  Increase Lyrica to 100 mg 2 tablets 3 times a day, Lortab 7.5/500 mg every 6 hours as needed EMG nerve conduction study X-ray of left femur, knee, tibial, foot   Marcial Pacas, M.D. Ph.D.  [facility] [address], [place], [place] [address] Ph: [phone] Fax: [phone]

## 2015-03-20 NOTE — Progress Notes (Signed)
PATIENT: Catherine Rogers DOB: 05/31/1961  HISTORICAL  Catherine Rogers is a 54 years old ambidextrous female, she is referred by her gynecologist Dr. Denman George for evaluation of left lower extremity paresthesia.  She presented with GI bleeding in October 2015, was found to have obstructive right colon, she had right hemicolectomy by Dr. 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 Denman George for exploratory laparotomy with right self single oophorectomy, pelvic, and para-aortic lymph node dissection, omentectomy, per surgical record, there was left-sided pelvic lymph node dissection, from proximally the bifurcation of the common iliac, distally the circumflex iliac vein, laterally the genitofemoral nerve, the medial border was the superior vesicle artery and the deep border was the obturator nerve.  Few minutes before she was put under for her third surgery February second 2016, she noticed intense shooting pain to her left lower extremity, woke up from surgery complains of 10 out of 10 extreme left lower extremity paresthesia and pain," I want him to take my left leg off," her left leg pain has been persistent since its onset,  Sh had extensive evaluation already,  Left lower extremity venous Doppler February third 2016 showed no evidence of DVT.  CT abdomen in Feb 16th 2016:  There are postsurgical changes post hysterectomy. Midline lower abdominal, pelvic wall scarring, there is a elongated loculated fluid collection in the right pelvic sidewall, extending along the medial aspect of iliac bone/right acetabulum. The collection measures at least 6.1 x 2.5 cm. This is highly suspicious for infected fluid or early  pelvic abscess. A second loculated fluid collection in right anterior pelvis/ mesentery axial image 49 on delayed images Measures 3.6 x 3.2 cm. Small amount of complex fluid is noted in right pelvis anteriorly just above the urinary bladder.  I have reviewed CT of lumbar, thoracic, in December 28 2014,Mild disc degeneration and moderate to severe lower lumbar facet arthrosis, worst at L4-5 where there is moderate spinal stenosis and moderate bilateral neural foraminal stenosis, No evidence of osseous metastases in the lumbar spine.  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  Her pain is failed to controlled by Lyrica, Lortab, I have add on Fentanyl Patch 46mg.  REVIEW OF SYSTEMS: Full 14 system review of systems performed and notable only for appetite change, chill, fever, light sensitivity, leg swelling, cold intolerance, nausea, restless leg, insomnia, achy muscles, muscle cramps, walking difficulties, anemia, dizziness, numbness, weakness, depression, anxiety   ALLERGIES: Allergies  Allergen Reactions  . Other Hives and Itching    Highly acidic foods "make me itch and break out" oranges, tomatoes  . Demerol [Meperidine] Nausea And Vomiting  . Ibuprofen Nausea And Vomiting  . Morphine And Related Nausea And Vomiting  . Oxycodone Itching  . Penicillins Other (See Comments)    Childhood allergy  . Tylenol [Acetaminophen] Nausea And Vomiting    HOME MEDICATIONS: Current Outpatient Prescriptions  Medication Sig Dispense Refill  . docusate sodium 100 MG CAPS Take 100 mg by mouth 2 (two) times daily as needed for mild constipation. (Patient taking differently: Take 100 mg by mouth 2 (two) times daily as needed (constipation). ) 10 capsule 0  . ferrous sulfate 325 (65 FE) MG tablet Take 1 tablet (325 mg total) by mouth 2 (two) times daily with a meal. 60 tablet 3  . gabapentin (NEURONTIN) 100 MG capsule Take 1 capsule (100 mg total) by mouth 2 (two) times daily. 60 capsule 1  . hydrochlorothiazide (HYDRODIURIL) 25 MG tablet Take 25 mg by mouth every morning.     .Marland KitchenHYDROcodone-acetaminophen (NORCO) 5-325 MG per tablet Take 1-2 tablets by mouth every 4 (four) hours as needed. 40 tablet 0  . methocarbamol (ROBAXIN) 500 MG tablet Take 500 mg by mouth every 8 (eight) hours as needed for muscle spasms.     . ondansetron (ZOFRAN) 8 MG tablet Take 1 tablet (8 mg total) by mouth every 8 (eight) hours as needed for nausea or vomiting. 20 tablet 3  . ranitidine (ZANTAC) 150 MG tablet Take 150 mg by mouth 2 (two) times daily  as needed for heartburn.        PAST MEDICAL HISTORY: Past Medical History  Diagnosis Date  . Hypertension   . Ectopic pregnancy   . Head injury, closed, with brief LOC     had a cut- as a child  . Asthma   . Anxiety   . Depression   . PTSD (post-traumatic stress disorder)   . GERD (gastroesophageal reflux disease)   . Arthritis   . Pelvic cyst   . Complication of anesthesia     woke and saw long tube coming out of mouth . sat up then went back to sleep  . PONV (postoperative nausea and vomiting)   . Family history of adverse reaction to anesthesia     sister on dialysis- hypotension   . Headache   . Cancer     colon and ovarian  . Anemia   . History of blood transfusion     2005 approximately   . Family history of ovarian cancer     PAST SURGICAL HISTORY: Past Surgical History  Procedure Laterality Date  . Esophagogastroduodenoscopy N/A 09/17/2014    Procedure: ESOPHAGOGASTRODUODENOSCOPY (EGD);  Surgeon: MJeryl Columbia MD;  [facility];  Service: Endoscopy;  Laterality: N/A;  . Abdominal hysterectomy    . Tubal ligation    . Partial colectomy Right 09/18/2014    Procedure: RIGHT HEMI COLECTOMY;  Surgeon: JDoreen Salvage  MD;  Location: [place];  Service: General;  Laterality: Right;  . Ileostomy Right 09/18/2014    Procedure: ILEOCOLOSTOMY;  Surgeon: Doreen Salvage, MD;  Location: [place];  Service: General;  Laterality: Right;  . Laparotomy N/A 11/01/2014    Procedure: EXPLORATORY LAPAROTOMY;  Surgeon: Doreen Salvage, MD;  Location: [place];  Service: General;  Laterality: N/A;  . Mass excision  11/01/2014    Procedure: EXCISION PELVIC MASS;  Surgeon: Doreen Salvage, MD;  Location: [place];  Service: General;;  . Robotic assisted total hysterectomy with bilateral salpingo oopherectomy Right 12/26/2014    Procedure: EXPLORATORY LAPAROSCOPY  RIGHT SALPINGO OOPHORECTOMY OMENTECTOMY LYMPHADECTOMY ;  Surgeon: Everitt Amber, MD;  Location: [facility];  Service: Gynecology;  Laterality: Right;  .  Laparotomy N/A 12/26/2014    Procedure:  LAPAROTOMY ;  Surgeon: Everitt Amber, MD;  Location: [facility];  Service: Gynecology;  Laterality: N/A;  . Oophorectomy  2016    FAMILY HISTORY: Family History  Problem Relation Age of Onset  . Brain cancer Sister 64  . Lung cancer Brother 46  . Heart attack Mother   . Heart attack Father     39s  . Heart attack Maternal Uncle   . Diabetes Maternal Uncle   . Ovarian cancer Paternal Aunt     3 paternal aunts with ovarian cancer  . Diabetes Paternal Uncle   . Heart attack Brother     SOCIAL HISTORY:  History   Social History  . Marital Status: Widowed    Spouse Name: N/A  . Number of Children: 6  . Years of Education: 13   Occupational History  . Disability    Social History Main Topics  . Smoking status: Never Smoker   . Smokeless tobacco: Never Used     Comment: pack per month of cigars   . Alcohol Use: 4.2 oz/week    7 Standard drinks or equivalent per week     Comment: socially   . Drug Use: Yes    Special: Marijuana     Comment: smokes marijuana every 2-3 days, last use in january    . Sexual Activity: No   Other Topics Concern  . Not on file   Social History Narrative   Born and raised in [place], [place]. Currently resides in a house by herself. No pets. Fun: Read, shoot pool.    Denies religious beliefs that would effect health care.    Lives at home alone.   Right-handed.   No caffeine use.   PHYSICAL EXAM   There were no vitals filed for this visit.  Not recorded      There is no weight on file to calculate BMI.  PHYSICAL EXAMNIATION:  Gen: NAD, conversant, well nourised, obese, well groomed                     Cardiovascular: Regular rate rhythm, no peripheral edema, warm, nontender. Eyes: Conjunctivae clear without exudates or hemorrhage Neck: Supple, no carotid bruise. Pulmonary: Clear to auscultation bilaterally   NEUROLOGICAL EXAM:  MENTAL STATUS: Speech:    Speech is normal; fluent and  spontaneous with normal comprehension.  Cognition:    The patient is oriented to person, place, and time;     recent and remote memory intact;     language fluent;     normal attention, concentration,     fund of knowledge.  CRANIAL NERVES: CN II: Visual fields are full to confrontation. Fundoscopic exam is normal with  sharp discs and no vascular changes. Venous pulsations are present bilaterally. Pupils are 4 mm and briskly reactive to light. Visual acuity is 20/20 bilaterally. CN III, IV, VI: extraocular movement are normal. No ptosis. CN V: Facial sensation is intact to pinprick in all 3 divisions bilaterally. Corneal responses are intact.  CN VII: Face is symmetric with normal eye closure and smile. CN VIII: Hearing is normal to rubbing fingers CN IX, X: Palate elevates symmetrically. Phonation is normal. CN XI: Head turning and shoulder shrug are intact CN XII: Tongue is midline with normal movements and no atrophy.  MOTOR: She has left foot swelling, tenderness upon deep palpation, limitation of motor strengths evaluation because of the pain, mild to moderate left ankle dorsi flexion weakness, bilateral upper extremity, right leg proximal and distal strength was normal   REFLEXES: Reflexes are 2+ and symmetric at the biceps, triceps, knees, and ankles. Plantar responses are flexor.  SENSORY:Hypersensitivity to light touch, pinprick at left distal leg, involving top, bottom of left foot, extending to distal area above ankle level, preserved   left toe position sense, and vibration sense .  COORDINATION: Rapid alternating movements and fine finger movements are intact. There is no dysmetria on finger-to-nose and heel-knee-shin. There are no abnormal or extraneous movements.   GAIT/STANCE: Antalgic gait, dragging her left leg due to left leg pain,  Romberg is absent.   DIAGNOSTIC DATA (LABS, IMAGING, TESTING) - I reviewed patient records, labs, notes, testing and imaging myself  where available.  Lab Results  Component Value Date   WBC 6.4 03/14/2015   HGB 14.7 03/14/2015   HCT 43.2 03/14/2015   MCV 101.9* 03/14/2015   PLT 204 03/14/2015      Component Value Date/Time   NA 143 03/14/2015 1105   NA 138 01/11/2015 1116   K 3.8 03/14/2015 1105   K 3.3* 01/11/2015 1116   CL 102 01/11/2015 1116   CO2 23 03/14/2015 1105   CO2 25 01/11/2015 1116   GLUCOSE 86 03/14/2015 1105   GLUCOSE 100* 01/11/2015 1116   BUN 17.8 03/14/2015 1105   BUN 17 01/11/2015 1116   CREATININE 0.8 03/14/2015 1105   CREATININE 0.66 01/11/2015 1116   CALCIUM 10.0 03/14/2015 1105   CALCIUM 9.6 01/11/2015 1116   PROT 8.1 03/14/2015 1105   PROT 7.4 12/22/2014 1425   ALBUMIN 4.3 03/14/2015 1105   ALBUMIN 4.3 12/22/2014 1425   AST 24 03/14/2015 1105   AST 22 12/22/2014 1425   ALT 19 03/14/2015 1105   ALT 16 12/22/2014 1425   ALKPHOS 72 03/14/2015 1105   ALKPHOS 79 12/22/2014 1425   BILITOT 0.44 03/14/2015 1105   BILITOT 0.6 12/22/2014 1425   GFRNONAA >90 01/11/2015 1116   GFRAA >90 01/11/2015 1116    ASSESSMENT AND PLAN  Catherine Rogers is a 54 y.o. femalecomplains of left lower extremity pain, since her left pelvic surgery in February second 2016, her most severe pain was at left foot, left foot swelling, tenderness upon deep palpitation, difficult to examine muscle strength, only mild left ankle dorsi flexion weakness, radiating pain to left lower extremity with deep palpation of left pelvic region, MRI of pelvic region showed avascular necrosis of left hip  Today's electrodiagnostic study showed active neuropathic changes involving left L4-5 S1 myotomes, in combination with her acute onset, following left pelvic lymph node dissection, mostly suspicious for left lumbosacral plexopathy, Continue Lyrica 100 mg 2 tablets 3 times a day, Lortab 7.5/500 mg every 6 hours as needed Add  on fentanyl patch 50 g every 72 hours Return to clinic in 2 months   Marcial Pacas, M.D.  Ph.D.  [facility] [address] [place], [place] [address] Ph: [phone] Fax: [phone]

## 2015-03-20 NOTE — Procedures (Signed)
   NCS (NERVE CONDUCTION STUDY) WITH EMG (ELECTROMYOGRAPHY) REPORT   STUDY DATE: March 20 2015 PATIENT NAME: Catherine Rogers DOB: [birth year] MRN: [id number]    TECHNOLOGIST: Laretta Alstrom ELECTROMYOGRAPHER: Marcial Pacas M.D.  CLINICAL INFORMATION:  54 year old female, presenting with severe left lower extremity pain since her left ovarian cancer, left pelvic lymph node dissection in October 2015.  FINDINGS: NERVE CONDUCTION STUDY: Bilateral peroneal sensory responses were normal. Right peroneal to EDB, tibial motor responses were normal. Left peroneal, tibial motor responses showed severely decreased C map amplitude. Right tibial H reflexes were present, left tibial H reflexes were absent.  NEEDLE ELECTROMYOGRAPHY: Selected needle examination was performed at bilateral lower extremity muscles, bilateral lumbar sacral paraspinal muscles.  There was increased insertional activity, 2-3 plus spontaneous activity at left tibialis anterior, tibialis posterior, enlarged complex motor unit potential with decreased recruitment patterns.  Left vastus lateralis, biceps femoris short head, gluteus medius, normal insertion activity, no spontaneous activity, mixture of normal, and some enlarged motor unit potential, with mildly decreased recruitment patterns.  Needle examination of right tibialis anterior, tibialis posterior, vastus lateralis, biceps femoris long head was normal  There was no spontaneous activity at bilateral lumbar sacral paraspinal muscles, bilateral L4-5 S1.  IMPRESSION:  This is an abnormal study. There is electrodiagnostic evidence of active left lumbar sacral plexopathy, mainly involving left L4, L5, S1 myotomes, could not rule out the possibility of left lumbosacral radiculopathy based on preserved left peroneal sensory response.  INTERPRETING PHYSICIAN:   Marcial Pacas M.D. Ph.D. [facility] [address] [place], [place] [address] [phone]

## 2015-03-21 ENCOUNTER — Telehealth: Payer: Self-pay | Admitting: Neurology

## 2015-03-21 DIAGNOSIS — D010 Carcinoma in situ of colon: Secondary | ICD-10-CM | POA: Diagnosis not present

## 2015-03-21 DIAGNOSIS — Z885 Allergy status to narcotic agent status: Secondary | ICD-10-CM | POA: Diagnosis not present

## 2015-03-21 DIAGNOSIS — M79605 Pain in left leg: Secondary | ICD-10-CM

## 2015-03-21 DIAGNOSIS — Z886 Allergy status to analgesic agent status: Secondary | ICD-10-CM | POA: Diagnosis not present

## 2015-03-21 DIAGNOSIS — Z90710 Acquired absence of both cervix and uterus: Secondary | ICD-10-CM | POA: Diagnosis not present

## 2015-03-21 DIAGNOSIS — R200 Anesthesia of skin: Secondary | ICD-10-CM | POA: Diagnosis not present

## 2015-03-21 DIAGNOSIS — Z880 Allergy status to penicillin: Secondary | ICD-10-CM | POA: Diagnosis not present

## 2015-03-21 DIAGNOSIS — G893 Neoplasm related pain (acute) (chronic): Secondary | ICD-10-CM

## 2015-03-21 DIAGNOSIS — I10 Essential (primary) hypertension: Secondary | ICD-10-CM | POA: Diagnosis not present

## 2015-03-21 DIAGNOSIS — D495 Neoplasm of unspecified behavior of other genitourinary organs: Secondary | ICD-10-CM | POA: Diagnosis not present

## 2015-03-21 NOTE — Telephone Encounter (Addendum)
Catherine Rogers, let pt know I have referred her to pain management.

## 2015-04-06 ENCOUNTER — Encounter: Payer: Self-pay | Admitting: Genetic Counselor

## 2015-04-06 DIAGNOSIS — Z1379 Encounter for other screening for genetic and chromosomal anomalies: Secondary | ICD-10-CM | POA: Insufficient documentation

## 2015-04-07 ENCOUNTER — Other Ambulatory Visit: Payer: Self-pay | Admitting: Neurology

## 2015-04-09 ENCOUNTER — Telehealth: Payer: Self-pay | Admitting: Neurology

## 2015-04-09 ENCOUNTER — Telehealth: Payer: Self-pay | Admitting: Genetic Counselor

## 2015-04-09 DIAGNOSIS — C562 Malignant neoplasm of left ovary: Secondary | ICD-10-CM

## 2015-04-09 DIAGNOSIS — C189 Malignant neoplasm of colon, unspecified: Secondary | ICD-10-CM

## 2015-04-09 MED ORDER — HYDROCODONE-ACETAMINOPHEN 7.5-500 MG PO TABS: 1.0000 | ORAL_TABLET | Freq: Four times a day (QID) | ORAL | Status: DC | PRN

## 2015-04-09 MED ORDER — FENTANYL 50 MCG/HR TD PT72: 50.0000 ug | MEDICATED_PATCH | TRANSDERMAL | Status: DC

## 2015-04-09 NOTE — Telephone Encounter (Signed)
LM on VM that we have results back.  Asked that she please call back.

## 2015-04-09 NOTE — Telephone Encounter (Signed)
Fentanyl was last written on 04/26 for 11 patches (33 day supply).  It is too soon to refill at this time.  Hydrocodone was last written on 04/14 for #120.  Note from 04/27 says patient is being referred to pain management.  I called the patient back.  States she is out of Hydrocodone and would like a new Rx, and has lost her Fentanyl patches.  Indicates she has not heard from pain clinic yet.  Please advise.  Thank you.

## 2015-04-09 NOTE — Telephone Encounter (Signed)
Patient called and requested a refill on Rx. fentaNYL (DURAGESIC) 50 MCG/HR and Rx. HYDROcodone-acetaminophen (LORTAB) 7.5-500 MG per tablet. Please call and advise.

## 2015-04-09 NOTE — Telephone Encounter (Signed)
Both prescription will be placed up front and ready for pick up anytime after 8am on 04/10/15 - pt aware. I called Dr. Nicholaus Bloom office (pain mgt) and left a message for Tiffany (referrals) to return my call.  We need the status on patient's 03/21/15 referral.

## 2015-04-09 NOTE — Telephone Encounter (Signed)
Revealed positive BRCA1 mutation found on Cancer[employer] panel testing.  Briefly discussed management of result, through mammogram, breast MRI vs. Mastectomy.  Discussed possibly bringing in other family members for testing when she comes in.  Will let Dr. Burr Medico know about result.

## 2015-04-09 NOTE — Telephone Encounter (Signed)
I have reordered fentanyl patch, and hydrocodone,  Please check on her pain management referral, please make sure that referral has been sent out, she is on the waiting list for their appointment.

## 2015-04-10 ENCOUNTER — Telehealth: Payer: Self-pay | Admitting: *Deleted

## 2015-04-10 NOTE — Telephone Encounter (Signed)
Called Dr. Hardin Negus office again [phone]) - Jonelle Sidle is no longer employed there - Thurmond Butts is now the new patient coordinator - left message for him to return my call.

## 2015-04-10 NOTE — Telephone Encounter (Signed)
Attempted call to Tiffany again this morning - left another message requesting status of patient's referral.

## 2015-04-10 NOTE — Telephone Encounter (Signed)
Received message yest from [facility] RN[facility]GY[facility] who had received call from pt stating that she didn't have her contrast to drink for CT & wanted to know if pt could p/u at [facility] since she lived closer to them.  [facility] & confirmed that pt could p/u contrast & instructions.  Called pt & informed of this & she will p/u contrast in next few days.

## 2015-04-11 ENCOUNTER — Telehealth: Payer: Self-pay | Admitting: Neurology

## 2015-04-11 NOTE — Telephone Encounter (Addendum)
Pharmacy is unable to get Hydrocodone 7.5/'500mg'$  in stock.  They would like to know if Rx can be changed to 7.5/'325mg'$  instead.  Indicate they can take verbal order for change this time.  I will be happy to call back if change is permissible.  Please advise.  Thank you.   Yes, it is OK to change. Marcial Pacas, M.D. Ph.D.  [facility] [place],  [address] Phone: [phone] Fax:      [phone]

## 2015-04-11 NOTE — Telephone Encounter (Signed)
Tom from [facility] in [place] # [phone] called regarding script for HYDROcodone-acetaminophen (LORTAB) 7.5-500 MG per tablet. He states that he needs a verbal order to change the dosage to 7.5-325 mg instead. Please call and advise.

## 2015-04-11 NOTE — Telephone Encounter (Signed)
I called the pharmacy back.  Spoke with pharmacist.  Relayed Dr Rhea Belton message.  He will update Rx and proceed with order.  I have annotated the Rx on med list as well.

## 2015-04-12 NOTE — Telephone Encounter (Signed)
Spoke to [place] - stated he does not have the referral information and needs it to be sent again.  He said we can mark it urgent and he will get it to the doctor immediately.  Thank you.

## 2015-04-12 NOTE — Telephone Encounter (Signed)
refaxed to Dr. Hardin Negus office and marked urgent.

## 2015-04-13 ENCOUNTER — Telehealth: Payer: Self-pay | Admitting: *Deleted

## 2015-04-13 NOTE — Telephone Encounter (Signed)
PT. SAID SHE FIGURED IT OUT HERSELF.

## 2015-04-16 ENCOUNTER — Emergency Department (HOSPITAL_BASED_OUTPATIENT_CLINIC_OR_DEPARTMENT_OTHER)
Admit: 2015-04-16 | Discharge: 2015-04-16 | Disposition: A | Discharge status: Home / Self Care | Payer: Medicare Other | Attending: Emergency Medicine | Admitting: Emergency Medicine

## 2015-04-16 ENCOUNTER — Ambulatory Visit (HOSPITAL_COMMUNITY): Admit: 2015-04-16 | Payer: Medicare Other

## 2015-04-16 ENCOUNTER — Ambulatory Visit (HOSPITAL_COMMUNITY)
Admission: RE | Admit: 2015-04-16 | Discharge: 2015-04-16 | Disposition: A | Discharge status: Home / Self Care | Payer: Medicare Other | Source: Ambulatory Visit | Attending: Hematology | Admitting: Hematology

## 2015-04-16 ENCOUNTER — Emergency Department (HOSPITAL_COMMUNITY)
Admission: EM | Admit: 2015-04-16 | Discharge: 2015-04-16 | Disposition: A | Discharge status: Home / Self Care | Payer: Medicare Other | Source: Home / Self Care | Attending: Emergency Medicine | Admitting: Emergency Medicine

## 2015-04-16 ENCOUNTER — Encounter (HOSPITAL_COMMUNITY): Payer: Self-pay

## 2015-04-16 ENCOUNTER — Emergency Department (HOSPITAL_BASED_OUTPATIENT_CLINIC_OR_DEPARTMENT_OTHER): Admit: 2015-04-16 | Discharge: 2015-04-16 | Disposition: A | Discharge status: Home / Self Care | Payer: Medicare Other

## 2015-04-16 ENCOUNTER — Encounter (HOSPITAL_COMMUNITY): Payer: Self-pay | Admitting: Emergency Medicine

## 2015-04-16 ENCOUNTER — Encounter: Payer: Self-pay | Admitting: Vascular Surgery

## 2015-04-16 ENCOUNTER — Other Ambulatory Visit (HOSPITAL_BASED_OUTPATIENT_CLINIC_OR_DEPARTMENT_OTHER): Payer: Medicare Other

## 2015-04-16 DIAGNOSIS — F419 Anxiety disorder, unspecified: Secondary | ICD-10-CM | POA: Insufficient documentation

## 2015-04-16 DIAGNOSIS — G629 Polyneuropathy, unspecified: Secondary | ICD-10-CM

## 2015-04-16 DIAGNOSIS — K219 Gastro-esophageal reflux disease without esophagitis: Secondary | ICD-10-CM | POA: Diagnosis present

## 2015-04-16 DIAGNOSIS — I739 Peripheral vascular disease, unspecified: Principal | ICD-10-CM | POA: Diagnosis present

## 2015-04-16 DIAGNOSIS — D649 Anemia, unspecified: Secondary | ICD-10-CM | POA: Insufficient documentation

## 2015-04-16 DIAGNOSIS — C189 Malignant neoplasm of colon, unspecified: Secondary | ICD-10-CM

## 2015-04-16 DIAGNOSIS — I998 Other disorder of circulatory system: Secondary | ICD-10-CM | POA: Insufficient documentation

## 2015-04-16 DIAGNOSIS — I745 Embolism and thrombosis of iliac artery: Secondary | ICD-10-CM | POA: Diagnosis not present

## 2015-04-16 DIAGNOSIS — Z880 Allergy status to penicillin: Secondary | ICD-10-CM | POA: Insufficient documentation

## 2015-04-16 DIAGNOSIS — C562 Malignant neoplasm of left ovary: Secondary | ICD-10-CM

## 2015-04-16 DIAGNOSIS — M79609 Pain in unspecified limb: Secondary | ICD-10-CM

## 2015-04-16 DIAGNOSIS — R918 Other nonspecific abnormal finding of lung field: Secondary | ICD-10-CM | POA: Diagnosis not present

## 2015-04-16 DIAGNOSIS — Z85038 Personal history of other malignant neoplasm of large intestine: Secondary | ICD-10-CM | POA: Diagnosis not present

## 2015-04-16 DIAGNOSIS — Z8589 Personal history of malignant neoplasm of other organs and systems: Secondary | ICD-10-CM

## 2015-04-16 DIAGNOSIS — F4310 Post-traumatic stress disorder, unspecified: Secondary | ICD-10-CM | POA: Insufficient documentation

## 2015-04-16 DIAGNOSIS — I700 Atherosclerosis of aorta: Secondary | ICD-10-CM | POA: Insufficient documentation

## 2015-04-16 DIAGNOSIS — L03116 Cellulitis of left lower limb: Secondary | ICD-10-CM | POA: Insufficient documentation

## 2015-04-16 DIAGNOSIS — Z791 Long term (current) use of non-steroidal anti-inflammatories (NSAID): Secondary | ICD-10-CM

## 2015-04-16 DIAGNOSIS — Z79899 Other long term (current) drug therapy: Secondary | ICD-10-CM

## 2015-04-16 DIAGNOSIS — J45909 Unspecified asthma, uncomplicated: Secondary | ICD-10-CM | POA: Insufficient documentation

## 2015-04-16 DIAGNOSIS — I10 Essential (primary) hypertension: Secondary | ICD-10-CM

## 2015-04-16 DIAGNOSIS — Z8543 Personal history of malignant neoplasm of ovary: Secondary | ICD-10-CM | POA: Insufficient documentation

## 2015-04-16 DIAGNOSIS — Z9049 Acquired absence of other specified parts of digestive tract: Secondary | ICD-10-CM | POA: Diagnosis present

## 2015-04-16 DIAGNOSIS — M1990 Unspecified osteoarthritis, unspecified site: Secondary | ICD-10-CM | POA: Diagnosis present

## 2015-04-16 DIAGNOSIS — Z886 Allergy status to analgesic agent status: Secondary | ICD-10-CM

## 2015-04-16 DIAGNOSIS — Z79891 Long term (current) use of opiate analgesic: Secondary | ICD-10-CM

## 2015-04-16 DIAGNOSIS — C182 Malignant neoplasm of ascending colon: Secondary | ICD-10-CM

## 2015-04-16 DIAGNOSIS — Z8719 Personal history of other diseases of the digestive system: Secondary | ICD-10-CM

## 2015-04-16 DIAGNOSIS — L03119 Cellulitis of unspecified part of limb: Secondary | ICD-10-CM

## 2015-04-16 DIAGNOSIS — Z885 Allergy status to narcotic agent status: Secondary | ICD-10-CM

## 2015-04-16 DIAGNOSIS — Z90710 Acquired absence of both cervix and uterus: Secondary | ICD-10-CM

## 2015-04-16 LAB — CBC WITH DIFFERENTIAL/PLATELET
BASO%: 0.3 % (ref 0.0–2.0)
Basophils Absolute: 0 10*3/uL (ref 0.0–0.1)
EOS ABS: 0.1 10*3/uL (ref 0.0–0.5)
EOS%: 0.7 % (ref 0.0–7.0)
HCT: 39.4 % (ref 34.8–46.6)
HGB: 14.1 g/dL (ref 11.6–15.9)
LYMPH%: 38.7 % (ref 14.0–49.7)
MCH: 35.1 pg — AB (ref 25.1–34.0)
MCHC: 35.8 g/dL (ref 31.5–36.0)
MCV: 98 fL (ref 79.5–101.0)
MONO#: 0.5 10*3/uL (ref 0.1–0.9)
MONO%: 6.9 % (ref 0.0–14.0)
NEUT#: 3.9 10*3/uL (ref 1.5–6.5)
NEUT%: 53.4 % (ref 38.4–76.8)
PLATELETS: 146 10*3/uL (ref 145–400)
RBC: 4.02 10*6/uL (ref 3.70–5.45)
RDW: 12.6 % (ref 11.2–14.5)
WBC: 7.3 10*3/uL (ref 3.9–10.3)
lymph#: 2.8 10*3/uL (ref 0.9–3.3)

## 2015-04-16 LAB — COMPREHENSIVE METABOLIC PANEL (CC13)
ALBUMIN: 3.9 g/dL (ref 3.5–5.0)
ALT: 10 U/L (ref 0–55)
AST: 19 U/L (ref 5–34)
Alkaline Phosphatase: 62 U/L (ref 40–150)
Anion Gap: 13 mEq/L — ABNORMAL HIGH (ref 3–11)
BUN: 10.7 mg/dL (ref 7.0–26.0)
CHLORIDE: 104 meq/L (ref 98–109)
CO2: 24 mEq/L (ref 22–29)
CREATININE: 0.7 mg/dL (ref 0.6–1.1)
Calcium: 9.2 mg/dL (ref 8.4–10.4)
EGFR: >90 mL/min/{1.73_m2} (ref >90)
Glucose: 69 mg/dl — ABNORMAL LOW (ref 70–140)
Potassium: 3.5 mEq/L (ref 3.5–5.1)
SODIUM: 142 meq/L (ref 136–145)
TOTAL PROTEIN: 7.6 g/dL (ref 6.4–8.3)
Total Bilirubin: 0.51 mg/dL (ref 0.20–1.20)

## 2015-04-16 MED ORDER — FENTANYL CITRATE (PF) 100 MCG/2ML IJ SOLN
50.0000 ug | Freq: Once | INTRAMUSCULAR | Status: AC
  Administered 2015-04-16: 50 ug via INTRAVENOUS
  Filled 2015-04-16: qty 2

## 2015-04-16 MED ORDER — IOHEXOL 300 MG/ML  SOLN: 100.0000 mL | Freq: Once | INTRAMUSCULAR | Status: AC | PRN

## 2015-04-16 MED ORDER — FENTANYL 25 MCG/HR TD PT72
50.0000 ug | MEDICATED_PATCH | TRANSDERMAL | Status: DC
Start: 2015-04-16 — End: 2015-04-16
  Administered 2015-04-16: 50 ug via TRANSDERMAL
  Filled 2015-04-16: qty 2

## 2015-04-16 MED ORDER — IOHEXOL 300 MG/ML  SOLN
100.0000 mL | Freq: Once | INTRAMUSCULAR | Status: AC | PRN
  Administered 2015-04-16: 100 mL via INTRAVENOUS

## 2015-04-16 MED ORDER — DOXYCYCLINE HYCLATE 100 MG PO CAPS: 100.0000 mg | ORAL_CAPSULE | Freq: Two times a day (BID) | ORAL | Status: DC

## 2015-04-16 NOTE — ED Notes (Signed)
Patient stated she was not going to allow anyone to do anymore blood work on her today.  I made the nurse awrae.

## 2015-04-16 NOTE — Progress Notes (Signed)
ED CM consulted for assist with a cane.  Pt states she was admitted in February 2016, did not get a cane after d/c and did not check after d/c on the delivery of cane since February admission  Pt states her home health PT "did not have one" to offer her when they were coming to see her.  Reports they recent stopped working with her CM discussed the general cost for the type of cane pt requesting "three prong" through home health agency. CM recommended pt consider purchasing a more discounted cane through [facility] pharmacy, [facility] or [facility] with Rx from [facility]  Also encouraged her to get assist from her family doctor.   Pt given a copy of the cost of a quad cane from [facility] average $17.00 and the contact information for [facility] medical along with copy of cane order from [facility]

## 2015-04-16 NOTE — Discharge Instructions (Signed)
Cellulitis Cellulitis is an infection of the skin and the tissue beneath it. The infected area is usually red and tender. Cellulitis occurs most often in the arms and lower legs.  CAUSES  Cellulitis is caused by bacteria that enter the skin through cracks or cuts in the skin. The most common types of bacteria that cause cellulitis are staphylococci and streptococci. SIGNS AND SYMPTOMS   Redness and warmth.  Swelling.  Tenderness or pain.  Fever. DIAGNOSIS  Your health care provider can usually determine what is wrong based on a physical exam. Blood tests may also be done. TREATMENT  Treatment usually involves taking an antibiotic medicine. HOME CARE INSTRUCTIONS   Take your antibiotic medicine as directed by your health care provider. Finish the antibiotic even if you start to feel better.  Keep the infected arm or leg elevated to reduce swelling.  Apply a warm cloth to the affected area up to 4 times per day to relieve pain.  Take medicines only as directed by your health care provider.  Keep all follow-up visits as directed by your health care provider. SEEK MEDICAL CARE IF:   You notice red streaks coming from the infected area.  Your red area gets larger or turns dark in color.  Your bone or joint underneath the infected area becomes painful after the skin has healed.  Your infection returns in the same area or another area.  You notice a swollen bump in the infected area.  You develop new symptoms.  You have a fever. SEEK IMMEDIATE MEDICAL CARE IF:   You feel very sleepy.  You develop vomiting or diarrhea.  You have a general ill feeling (malaise) with muscle aches and pains. MAKE SURE YOU:   Understand these instructions.  Will watch your condition.  Will get help right away if you are not doing well or get worse. Document Released: 08/20/2005 Document Revised: 03/27/2014 Document Reviewed: 01/26/2012 [facility] Patient Information 2015 [place], [place].  This information is not intended to replace advice given to you by your health care provider. Make sure you discuss any questions you have with your health care provider.  Neuropathic Pain We often think that pain has a physical cause. If we get rid of the cause, the pain should go away. Nerves themselves can also cause pain. It is called neuropathic pain, which means nerve abnormality. It may be difficult for the patients who have it and for the treating caregivers. Pain is usually described as acute (short-lived) or chronic (long-lasting). Acute pain is related to the physical sensations caused by an injury. It can last from a few seconds to many weeks, but it usually goes away when normal healing occurs. Chronic pain lasts beyond the typical healing time. With neuropathic pain, the nerve fibers themselves may be damaged or injured. They then send incorrect signals to other pain centers. The pain you feel is real, but the cause is not easy to find.  CAUSES  Chronic pain can result from diseases, such as diabetes and shingles (an infection related to chickenpox), or from trauma, surgery, or amputation. It can also happen without any known injury or disease. The nerves are sending pain messages, even though there is no identifiable cause for such messages.   Other common causes of neuropathy include diabetes, phantom limb pain, or Regional Pain Syndrome (RPS).  As with all forms of chronic back pain, if neuropathy is not correctly treated, there can be a number of associated problems that lead to a downward cycle  for the patient. These include depression, sleeplessness, feelings of fear and anxiety, limited social interaction and inability to do normal daily activities or work.  The most dramatic and mysterious example of neuropathic pain is called "phantom limb syndrome." This occurs when an arm or a leg has been removed because of illness or injury. The brain still gets pain messages from the nerves  that originally carried impulses from the missing limb. These nerves now seem to misfire and cause troubling pain.  Neuropathic pain often seems to have no cause. It responds poorly to standard pain treatment. Neuropathic pain can occur after:  Shingles (herpes zoster virus infection).  A lasting burning sensation of the skin, caused usually by injury to a peripheral nerve.  Peripheral neuropathy which is widespread nerve damage, often caused by diabetes or alcoholism.  Phantom limb pain following an amputation.  Facial nerve problems (trigeminal neuralgia).  Multiple sclerosis.  Reflex sympathetic dystrophy.  Pain which comes with cancer and cancer chemotherapy.  Entrapment neuropathy such as when pressure is put on a nerve such as in carpal tunnel syndrome.  Back, leg, and hip problems (sciatica).  Spine or back surgery.  HIV Infection or AIDS where nerves are infected by viruses. Your caregiver can explain items in the above list which may apply to you. SYMPTOMS  Characteristics of neuropathic pain are:  Severe, sharp, electric shock-like, shooting, lightening-like, knife-like.  Pins and needles sensation.  Deep burning, deep cold, or deep ache.  Persistent numbness, tingling, or weakness.  Pain resulting from light touch or other stimulus that would not usually cause pain.  Increased sensitivity to something that would normally cause pain, such as a pinprick. Pain may persist for months or years following the healing of damaged tissues. When this happens, pain signals no longer sound an alarm about current injuries or injuries about to happen. Instead, the alarm system itself is not working correctly.  Neuropathic pain may get worse instead of better over time. For some people, it can lead to serious disability. It is important to be aware that severe injury in a limb can occur without a proper, protective pain response.Burns, cuts, and other injuries may go  unnoticed. Without proper treatment, these injuries can become infected or lead to further disability. Take any injury seriously, and consult your caregiver for treatment. DIAGNOSIS  When you have a pain with no known cause, your caregiver will probably ask some specific questions:   Do you have any other conditions, such as diabetes, shingles, multiple sclerosis, or HIV infection?  How would you describe your pain? (Neuropathic pain is often described as shooting, stabbing, burning, or searing.)  Is your pain worse at any time of the day? (Neuropathic pain is usually worse at night.)  Does the pain seem to follow a certain physical pathway?  Does the pain come from an area that has missing or injured nerves? (An example would be phantom limb pain.)  Is the pain triggered by minor things such as rubbing against the sheets at night? These questions often help define the type of pain involved. Once your caregiver knows what is happening, treatment can begin. Anticonvulsant, antidepressant drugs, and various pain relievers seem to work in some cases. If another condition, such as diabetes is involved, better management of that disorder may relieve the neuropathic pain.  TREATMENT  Neuropathic pain is frequently long-lasting and tends not to respond to treatment with narcotic type pain medication. It may respond well to other drugs such as antiseizure and  antidepressant medications. Usually, neuropathic problems do not completely go away, but partial improvement is often possible with proper treatment. Your caregivers have large numbers of medications available to treat you. Do not be discouraged if you do not get immediate relief. Sometimes different medications or a combination of medications will be tried before you receive the results you are hoping for. See your caregiver if you have pain that seems to be coming from nowhere and does not go away. Help is available.  SEEK IMMEDIATE MEDICAL CARE IF:    There is a sudden change in the quality of your pain, especially if the change is on only one side of the body.  You notice changes of the skin, such as redness, black or purple discoloration, swelling, or an ulcer.  You cannot move the affected limbs. Document Released: 08/07/2004 Document Revised: 02/02/2012 Document Reviewed: 08/07/2004 [facility] Patient Information 2015 [place], [place]. This information is not intended to replace advice given to you by your health care provider. Make sure you discuss any questions you have with your health care provider.

## 2015-04-16 NOTE — ED Notes (Signed)
MD at bedside. 

## 2015-04-16 NOTE — Progress Notes (Signed)
VASCULAR LAB PRELIMINARY  ARTERIAL   RIGHT    LEFT    PRESSURE WAVEFORM  PRESSURE WAVEFORM  BRACHIAL 175 Triphasic  BRACHIAL 167 Triphasic   DP 168 Triphasic  DP 29 Dampened monophasic   AT   AT    PT 146 Triphasic  PT 23 Dampened monophasic   PER   PER    GREAT TOE  NA GREAT TOE  NA    RIGHT LEFT  ABI 0.96 0.17   Duplex imaging:  Severely dampened waveforms from the CFA through to the distal PTA.  Velocities of 76/50 in the CFA and 22/13 at the distal PTA.  Minimal plaque with no evidence of stenosis.  Inflow disease.  Renatta Shrieves, RVT 04/16/2015, 3:09 PM

## 2015-04-16 NOTE — ED Notes (Signed)
Case management called regarding cane that was ordered.

## 2015-04-16 NOTE — ED Provider Notes (Signed)
CSN: [id number]     Arrival date & time 04/16/15  1004 History   First MD Initiated Contact with Patient 04/16/15 1039     Chief Complaint  Patient presents with  . Leg Pain     (Consider location/radiation/quality/duration/timing/severity/associated sxs/prior Treatment) Patient is a 54 y.o. female presenting with leg pain. The history is provided by the patient.  Leg Pain  patient with pain in her left lower extremity. 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 This was around 2-3 months ago. Since then she's had pains. She's had a rather extensive workup and it appears that most recently a nerve conduction study showed some nerve abnormalities. She is on Lyrica and hydrocodone without pain relief. Is also due to be started on fentanyl patch but has not started it yet. No new trauma. Worse with walking. States she's been able to sleep due to the pain. States she does itch due to the medicines. No fevers. Has had a negative venous Doppler the past. Also has some necrosis of the left hip bone. No chest pain or shortness of breath. States her foot is red but his been this way since it started. She had a CT scan done today her chest abdomen and pelvis she is supposed to follow-up with her oncologist tomorrow.  Past Medical History  Diagnosis Date  . Hypertension   . Ectopic pregnancy   . Head injury, closed, with brief LOC     had a cut- as a child  . Asthma   . Anxiety   . Depression   . PTSD (post-traumatic stress disorder)   . GERD (gastroesophageal reflux disease)   . Arthritis   . Pelvic cyst   . Complication of anesthesia     woke and saw long tube coming out of mouth . sat up then went back to sleep  . PONV (postoperative nausea and vomiting)   . Family history of adverse reaction to anesthesia     sister on dialysis- hypotension   . Headache   . Cancer     colon and ovarian  . Anemia   . History of blood transfusion     2005 approximately   . Family history of  ovarian cancer    Past Surgical History  Procedure Laterality Date  . Esophagogastroduodenoscopy N/A 09/17/2014    Procedure: ESOPHAGOGASTRODUODENOSCOPY (EGD);  Surgeon: Jeryl Columbia, MD;  Location: [facility] ENDOSCOPY;  Service: Endoscopy;  Laterality: N/A;  . Abdominal hysterectomy    . Tubal ligation    . Partial colectomy Right 09/18/2014    Procedure: RIGHT HEMI COLECTOMY;  Surgeon: Doreen Salvage, MD;  Location: [place];  Service: General;  Laterality: Right;  . Ileostomy Right 09/18/2014    Procedure: ILEOCOLOSTOMY;  Surgeon: Doreen Salvage, MD;  Location: [place];  Service: General;  Laterality: Right;  . Laparotomy N/A 11/01/2014    Procedure: EXPLORATORY LAPAROTOMY;  Surgeon: Doreen Salvage, MD;  Location: [place];  Service: General;  Laterality: N/A;  . Mass excision  11/01/2014    Procedure: EXCISION PELVIC MASS;  Surgeon: Doreen Salvage, MD;  Location: [place];  Service: General;;  . Robotic assisted total hysterectomy with bilateral salpingo oopherectomy Right 12/26/2014    Procedure: EXPLORATORY LAPAROSCOPY  RIGHT SALPINGO OOPHORECTOMY OMENTECTOMY LYMPHADECTOMY ;  Surgeon: Everitt Amber, MD;  Location: [facility] ORS;  Service: Gynecology;  Laterality: Right;  . Laparotomy N/A 12/26/2014    Procedure:  LAPAROTOMY ;  Surgeon: Everitt Amber, MD;  Location: [facility] OR[facility];  Service: Gynecology;  Laterality: N/A;  . Oophorectomy  2016   Family History  Problem Relation Age of Onset  . Brain cancer Sister 71  . Lung cancer Brother 22  . Heart attack Mother   . Heart attack Father     39s  . Heart attack Maternal Uncle   . Diabetes Maternal Uncle   . Ovarian cancer Paternal Aunt     3 paternal aunts with ovarian cancer  . Diabetes Paternal Uncle   . Heart attack Brother    History  Substance Use Topics  . Smoking status: Never Smoker   . Smokeless tobacco: Never Used     Comment: pack per month of cigars   . Alcohol Use: 4.2 oz/week    7 Standard drinks or equivalent per week     Comment: socially    OB History    No data  available     Review of Systems  Constitutional: Negative for diaphoresis.  Respiratory: Negative for cough.   Cardiovascular: Negative for chest pain.  Gastrointestinal: Negative for abdominal pain.  Genitourinary: Negative for flank pain.  Skin: Positive for color change. Negative for rash and wound.  Neurological: Negative for weakness and numbness.      Allergies  Other; Demerol; Ibuprofen; Morphine and related; Oxycodone; Penicillins; and Tylenol  Home Medications   Prior to Admission medications   Medication Sig Start Date End Date Taking? Authorizing Provider  docusate sodium 100 MG CAPS Take 100 mg by mouth 2 (two) times daily as needed for mild constipation. Patient taking differently: Take 100 mg by mouth 2 (two) times daily as needed (constipation).  09/22/14  Yes Megan N Dort, PA-C  fentaNYL (DURAGESIC) 50 MCG/HR Place 1 patch (50 mcg total) onto the skin every 3 (three) days. 04/09/15  Yes Marcial Pacas, MD  hydrochlorothiazide (HYDRODIURIL) 25 MG tablet Take 25 mg by mouth every morning.  09/14/14  Yes Historical Provider, MD  HYDROcodone-acetaminophen (LORTAB) 7.5-500 MG per tablet Take 1 tablet by mouth every 6 (six) hours as needed for pain. Patient taking differently: Take 1 tablet by mouth every 6 (six) hours as needed for pain.  04/09/15  Yes Marcial Pacas, MD  ibuprofen (ADVIL,MOTRIN) 800 MG tablet Take 800 mg by mouth every 8 (eight) hours as needed for headache, mild pain or moderate pain.   Yes Historical Provider, MD  doxycycline (VIBRAMYCIN) 100 MG capsule Take 1 capsule (100 mg total) by mouth 2 (two) times daily. 04/16/15   Davonna Belling, MD  enoxaparin (LOVENOX) 40 MG/0.4ML injection Inject 0.4 mLs (40 mg total) into the skin daily. Patient not taking: Reported on 04/16/2015 12/29/14   Dorothyann Gibbs, NP  ferrous sulfate 325 (65 FE) MG tablet Take 1 tablet (325 mg total) by mouth 2 (two) times daily with a meal. Patient not taking: Reported on 04/16/2015 12/29/14    Dorothyann Gibbs, NP  gabapentin (NEURONTIN) 100 MG capsule Take 1 capsule (100 mg total) by mouth 2 (two) times daily. Patient not taking: Reported on 04/16/2015 12/29/14   Dorothyann Gibbs, NP  metroNIDAZOLE (FLAGYL) 500 MG tablet Take 1 tablet (500 mg total) by mouth 3 (three) times daily. Patient not taking: Reported on 04/16/2015 01/19/15   Dorothyann Gibbs, NP  ondansetron (ZOFRAN) 8 MG tablet Take 1 tablet (8 mg total) by mouth every 8 (eight) hours as needed for nausea or vomiting. Patient not taking: Reported on 04/16/2015 11/28/14   Truitt Merle, MD  pregabalin (LYRICA) 100 MG capsule 2 tabs tid Patient  not taking: Reported on 04/16/2015 03/08/15   Marcial Pacas, MD   BP 157/95 mmHg  Pulse 78  Temp(Src) 98 F (36.7 C) (Oral)  Resp 18  SpO2 98% Physical Exam  Constitutional: She appears well-developed.  HENT:  Head: Atraumatic.  Eyes: EOM are normal.  Cardiovascular: Normal rate.   Pulmonary/Chest: Effort normal.  Abdominal: Soft. There is no tenderness.  Musculoskeletal:  Some warmth of left foot with some swelling. Difficulty palpating pulses. Tenderness to foot and somewhat in the posterior knee area. Some chronic skin changes. Somewhat difficult to assess capillary refill due to patient's skin tone.  Skin: Skin is warm. There is erythema.    ED Course  Procedures (including critical care time) Labs Review Labs Reviewed - No data to display  Imaging Review Ct Chest W Contrast  04/16/2015   CLINICAL DATA:  Restaging colon cancer  EXAM: CT CHEST, ABDOMEN, AND PELVIS WITH CONTRAST  TECHNIQUE: Multidetector CT imaging of the chest, abdomen and pelvis was performed following the standard protocol during bolus administration of intravenous contrast.  CONTRAST:  129m OMNIPAQUE IOHEXOL 300 MG/ML  SOLN  COMPARISON:  12/28/2014  FINDINGS: CT CHEST FINDINGS  Mediastinum: Normal heart size. No pericardial effusion identified. No mediastinal or hilar adenopathy identified. There is no axillary or  supraclavicular adenopathy.  Lungs/Pleura: No pleural effusion. There is no airspace consolidation or atelectasis. Scar noted in the left lower lobe. 4 mm nodule is identified within the right upper lobe. This is unchanged from 2013 and is compatible with a benign process. Left upper lobe nodule measures 4 mm and is unchanged from 2013. Also compatible with a benign process. No new or enlarging pulmonary nodules or masses noted.  Musculoskeletal: Review of the visualized bony structures is negative for aggressive lytic or sclerotic bone lesion.  CT ABDOMEN AND PELVIS FINDINGS  Hepatobiliary: No suspicious liver abnormality. The gallbladder appears normal. No biliary dilatation.  Pancreas: Normal appearance of the pancreas.  Spleen: Negative  Adrenals/Urinary Tract: The adrenal glands are both negative. Normal appearance of the kidneys. The urinary bladder is normal for degree of distention  Stomach/Bowel: Normal appearance of the stomach. The small bowel loops appear normal. Postoperative changes within the proximal colon from partial colectomy noted.  Vascular/Lymphatic: Calcified atherosclerotic disease involves the abdominal aorta. No aneurysm. Occlusion of the left common iliac artery noted. No enlarged retroperitoneal or mesenteric adenopathy. No enlarged pelvic or inguinal lymph nodes.  Reproductive: Previous hysterectomy.  No adnexal mass noted.  Other: Resolution of previous septated mass within the left pelvic sidewall region. No new areas of disease identified.  Musculoskeletal: No aggressive lytic or sclerotic bone lesions identified. Degenerative changes are noted within the lumbar spine. 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  IMPRESSION: 1. No evidence for thoracic metastasis. 2. Resolution of previous solid and cystic mass and left pelvic side wall. No new or progressive disease identified within the abdomen or pelvis. 3. Aortic atherosclerosis. 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   On: 04/16/2015 10:52   Ct Abdomen Pelvis W Contrast  04/16/2015   CLINICAL DATA:  Restaging colon cancer  EXAM: CT CHEST, ABDOMEN, AND PELVIS WITH CONTRAST  TECHNIQUE: Multidetector CT imaging of the chest, abdomen and pelvis was performed following the standard protocol during bolus administration of intravenous contrast.  CONTRAST:  10105mOMNIPAQUE IOHEXOL 300 MG/ML  SOLN  COMPARISON:  12/28/2014  FINDINGS: CT  CHEST FINDINGS  Mediastinum: Normal heart size. No pericardial effusion identified. No mediastinal or hilar adenopathy identified. There is no axillary or supraclavicular adenopathy.  Lungs/Pleura: No pleural effusion. There is no airspace consolidation or atelectasis. Scar noted in the left lower lobe. 4 mm nodule is identified within the right upper lobe. This is unchanged from 2013 and is compatible with a benign process. Left upper lobe nodule measures 4 mm and is unchanged from 2013. Also compatible with a benign process. No new or enlarging pulmonary nodules or masses noted.  Musculoskeletal: Review of the visualized bony structures is negative for aggressive lytic or sclerotic bone lesion.  CT ABDOMEN AND PELVIS FINDINGS  Hepatobiliary: No suspicious liver abnormality. The gallbladder appears normal. No biliary dilatation.  Pancreas: Normal appearance of the pancreas.  Spleen: Negative  Adrenals/Urinary Tract: The adrenal glands are both negative. Normal appearance of the kidneys. The urinary bladder is normal for degree of distention  Stomach/Bowel: Normal appearance of the stomach. The small bowel loops appear normal. Postoperative changes within the proximal colon from partial colectomy noted.  Vascular/Lymphatic: Calcified atherosclerotic disease involves the abdominal aorta. No aneurysm. Occlusion of the left common iliac artery noted. No enlarged retroperitoneal or mesenteric adenopathy. No enlarged pelvic or inguinal  lymph nodes.  Reproductive: Previous hysterectomy.  No adnexal mass noted.  Other: Resolution of previous septated mass within the left pelvic sidewall region. No new areas of disease identified.  Musculoskeletal: No aggressive lytic or sclerotic bone lesions identified. Degenerative changes are noted within the lumbar spine. 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  IMPRESSION: 1. No evidence for thoracic metastasis. 2. Resolution of previous solid and cystic mass and left pelvic side wall. No new or progressive disease identified within the abdomen or pelvis. 3. Aortic atherosclerosis. There is occlusion of the left common iliac artery. 4. Left hip AVN   Electronically Signed   By: Kerby Moors M.D.   On: 04/16/2015 10:52     EKG Interpretation None      MDM   Final diagnoses:  Cellulitis of foot  Ischemia of lower extremity  Neuropathy    Patient with pain in her left lower extremity. Has been worked up for the same and has a neuropathy. Also found to have occluded iliac artery on CT scan. Vascular studies done and shows an ABI of 0.13 and very little flow in the leg arteries on the left although there is no occlusion visualized. Discussed with Dr. Donnetta Hutching who will see the patient in consult tomorrow in his office. Will treat with antibiotics due to the redness and foot. Has follow-up tomorrow with her oncologist.     Davonna Belling, MD 04/16/15 [phone]

## 2015-04-16 NOTE — ED Notes (Signed)
Pt c/o left leg pain from left hip to left great toe, worse pain in left foot, edema, and redness onset February 2 after ovarian cancer surgery during which a nerve was damaged. Pt being seen by specialists but requests acute pain control.

## 2015-04-17 ENCOUNTER — Ambulatory Visit: Payer: Medicare Other | Admitting: Gynecologic Oncology

## 2015-04-17 ENCOUNTER — Ambulatory Visit (INDEPENDENT_AMBULATORY_CARE_PROVIDER_SITE_OTHER): Payer: Medicare Other | Admitting: Vascular Surgery

## 2015-04-17 ENCOUNTER — Encounter (HOSPITAL_COMMUNITY): Payer: Self-pay | Admitting: *Deleted

## 2015-04-17 ENCOUNTER — Encounter: Payer: Self-pay | Admitting: Nutrition

## 2015-04-17 ENCOUNTER — Other Ambulatory Visit: Payer: Self-pay

## 2015-04-17 ENCOUNTER — Encounter: Payer: Self-pay | Admitting: Vascular Surgery

## 2015-04-17 VITALS — BP 147/96 | HR 74 | Resp 14 | Ht 67.0 in | Wt 122.0 lb

## 2015-04-17 DIAGNOSIS — I998 Other disorder of circulatory system: Secondary | ICD-10-CM | POA: Diagnosis not present

## 2015-04-17 DIAGNOSIS — I70229 Atherosclerosis of native arteries of extremities with rest pain, unspecified extremity: Secondary | ICD-10-CM

## 2015-04-17 NOTE — Progress Notes (Signed)
Filed Vitals:   04/17/15 1546 04/17/15 1550  BP: 144/99 147/96  Pulse: 89 74  Resp: 14   Height: '5\' 7"'$  (1.702 m)   Weight: 122 lb (55.339 kg)    Body mass index is 19.1 kg/(m^2).

## 2015-04-17 NOTE — Progress Notes (Signed)
I was unable to reach patient by phone.  I left  A message on voice mail.  I instructed the patient to arrive at [place] entrance at 0900- per Dr Oneida Alar instructions.   , nothing to eat or drink after midnight.   I instructed the patient to take the following medications in the am with just enough water to get them down: if needed Lyrica and Percocet. I asked patient to not wear any lotions, powders, cologne, jewelry, piercing, make-up or nail polish and to not shave.  I asked the patient to call [phone], in the am if there were any questions or problems.

## 2015-04-17 NOTE — Progress Notes (Signed)
Patient name: Catherine Rogers MRN: [id number] DOB: May 27, 1961 Sex: female   Referred by: Alvino Chapel  Reason for referral:  Chief Complaint  Patient presents with  . New Evaluation    f/u from ED    HISTORY OF PRESENT ILLNESS: Patient resents today for evaluation of left leg ischemia. She has an extremely complex past history. She is very pleasant 54 year old female who was found to have a mass in her cecum in the October 2015. This appeared to be a colon cancer with ileocecal intussusception. She underwent resection of this electively was found to have colon cancer. Subsequently in December 2015 she developed a left pelvic mass. 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 She subsequently underwent laparoscopy and the removal of her right ovary by OB/GYN in February 2016. She reports that following her initial pelvic surgery in the October she began having discomfort in her left leg. This is progressed since this time. She has had multiple duplexes to rule out DVT and these of all been negative. She is seeing neurology with an extensive neurologic workup as well. She presented to the [facility] long emergency department yesterday for evaluation of worsening pain. 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 Interestingly in reviewing her initial CT scan in October 2015, she did have occlusion of the origin of her left common iliac at that time. She has had extension of the iliac occlusion. She reports severe classic rest pain. She is awakened multiple times during the night and has to hang her leg in a dependent fashion for relief. She is difficult time walking related to this.  Past Medical History  Diagnosis Date  . Hypertension   . Ectopic pregnancy   . Head injury, closed, with brief LOC     had a cut- as a child  . Asthma   . Anxiety    . Depression   . PTSD (post-traumatic stress disorder)   . GERD (gastroesophageal reflux disease)   . Arthritis   . Pelvic cyst   . Complication of anesthesia     woke and saw long tube coming out of mouth . sat up then went back to sleep  . PONV (postoperative nausea and vomiting)   . Family history of adverse reaction to anesthesia     sister on dialysis- hypotension   . Headache   . Cancer     colon and ovarian  . Anemia   . History of blood transfusion     2005 approximately   . Family history of ovarian cancer     Past Surgical History  Procedure Laterality Date  . Esophagogastroduodenoscopy N/A 09/17/2014    Procedure: ESOPHAGOGASTRODUODENOSCOPY (EGD);  Surgeon: Jeryl Columbia, MD;  Location: [facility] ENDOSCOPY;  Service: Endoscopy;  Laterality: N/A;  . Abdominal hysterectomy    . Tubal ligation    . Partial colectomy Right 09/18/2014    Procedure: RIGHT HEMI COLECTOMY;  Surgeon: Doreen Salvage, MD;  Location: [place];  Service: General;  Laterality: Right;  . Ileostomy Right 09/18/2014    Procedure: ILEOCOLOSTOMY;  Surgeon: Doreen Salvage, MD;  Location: [place];  Service: General;  Laterality: Right;  . Laparotomy N/A 11/01/2014    Procedure: EXPLORATORY LAPAROTOMY;  Surgeon: Doreen Salvage, MD;  Location: [place];  Service: General;  Laterality: N/A;  . Mass  excision  11/01/2014    Procedure: EXCISION PELVIC MASS;  Surgeon: Doreen Salvage, MD;  Location: [place];  Service: General;;  . Robotic assisted total hysterectomy with bilateral salpingo oopherectomy Right 12/26/2014    Procedure: EXPLORATORY LAPAROSCOPY  RIGHT SALPINGO OOPHORECTOMY OMENTECTOMY LYMPHADECTOMY ;  Surgeon: Everitt Amber, MD;  Location: [facility]S;  Service: Gynecology;  Laterality: Right;  . Laparotomy N/A 12/26/2014    Procedure:  LAPAROTOMY ;  Surgeon: Everitt Amber, MD;  Location: [facility];  Service: Gynecology;  Laterality: N/A;  . Oophorectomy  2016    History   Social History  . Marital Status: Widowed    Spouse Name: N/A  . Number of  Children: 6  . Years of Education: 13   Occupational History  . Disability    Social History Main Topics  . Smoking status: Never Smoker   . Smokeless tobacco: Never Used     Comment: pack per month of cigars   . Alcohol Use: No     Comment: socially   . Drug Use: No     Comment: smokes marijuana every 2-3 days, last use in january    . Sexual Activity: No   Other Topics Concern  . Not on file   Social History Narrative   Born and raised in [place], [place]. Currently resides in a house by herself. No pets. Fun: Read, shoot pool.    Denies religious beliefs that would effect health care.    Lives at home alone.   Right-handed.   No caffeine use.    Family History  Problem Relation Age of Onset  . Brain cancer Sister 77  . Lung cancer Brother 70  . Heart attack Mother   . Heart attack Father     66s  . Heart attack Maternal Uncle   . Diabetes Maternal Uncle   . Ovarian cancer Paternal Aunt     3 paternal aunts with ovarian cancer  . Diabetes Paternal Uncle   . Heart attack Brother     Allergies as of 04/17/2015 - Review Complete 04/17/2015  Allergen Reaction Noted  . Other Hives and Itching 09/18/2014  . Demerol [meperidine] Nausea And Vomiting 09/16/2014  . Ibuprofen Nausea And Vomiting 12/27/2014  . Morphine and related Nausea And Vomiting 09/16/2014  . Oxycodone Itching 01/11/2015  . Penicillins Other (See Comments) 09/16/2014  . Tylenol [acetaminophen] Nausea And Vomiting 12/27/2014    Current Outpatient Prescriptions on File Prior to Visit  Medication Sig Dispense Refill  . docusate sodium 100 MG CAPS Take 100 mg by mouth 2 (two) times daily as needed for mild constipation. (Patient taking differently: Take 100 mg by mouth 2 (two) times daily as needed (constipation). ) 10 capsule 0  . doxycycline (VIBRAMYCIN) 100 MG capsule Take 1 capsule (100 mg total) by mouth 2 (two) times daily. 10 capsule 0  . fentaNYL (DURAGESIC) 50 MCG/HR Place 1 patch (50 mcg  total) onto the skin every 3 (three) days. 11 patch 0  . ferrous sulfate 325 (65 FE) MG tablet Take 1 tablet (325 mg total) by mouth 2 (two) times daily with a meal. 60 tablet 3  . hydrochlorothiazide (HYDRODIURIL) 25 MG tablet Take 25 mg by mouth every morning.     [name] HYDROcodone-acetaminophen (LORTAB) 7.5-500 MG per tablet Take 1 tablet by mouth every 6 (six) hours as needed for pain. (Patient taking differently: Take 1 tablet by mouth every 6 (six) hours as needed for pain. ) 90 tablet 0  . ondansetron (  ZOFRAN) 8 MG tablet Take 1 tablet (8 mg total) by mouth every 8 (eight) hours as needed for nausea or vomiting. 20 tablet 3  . pregabalin (LYRICA) 100 MG capsule 2 tabs tid 180 capsule 6  . enoxaparin (LOVENOX) 40 MG/0.4ML injection Inject 0.4 mLs (40 mg total) into the skin daily. (Patient not taking: Reported on 04/16/2015) 25 Syringe 0  . gabapentin (NEURONTIN) 100 MG capsule Take 1 capsule (100 mg total) by mouth 2 (two) times daily. (Patient not taking: Reported on 04/16/2015) 60 capsule 1  . ibuprofen (ADVIL,MOTRIN) 800 MG tablet Take 800 mg by mouth every 8 (eight) hours as needed for headache, mild pain or moderate pain.    . metroNIDAZOLE (FLAGYL) 500 MG tablet Take 1 tablet (500 mg total) by mouth 3 (three) times daily. (Patient not taking: Reported on 04/16/2015) 30 tablet 0   No current facility-administered medications on file prior to visit.     REVIEW OF SYSTEMS:  Positives indicated with an "X"  CARDIOVASCULAR:  '[ ]'$  chest pain   '[ ]'$  chest pressure   '[ ]'$  palpitations   '[ ]'$  orthopnea   '[ ]'$  dyspnea on exertion   [x ] claudication   [ x] rest pain   '[ ]'$  DVT   '[ ]'$  phlebitis PULMONARY:   '[ ]'$  productive cough   '[ ]'$  asthma   '[ ]'$  wheezing NEUROLOGIC:   x'[ ]'$  weakness  [ x] paresthesias  '[ ]'$  aphasia  '[ ]'$  amaurosis  '[ ]'$  dizziness HEMATOLOGIC:   '[ ]'$  bleeding problems   '[ ]'$  clotting disorders MUSCULOSKELETAL:  '[ ]'$  joint pain   '[ ]'$  joint swelling GASTROINTESTINAL: '[ ]'$   blood in stool  '[ ]'$    hematemesis GENITOURINARY:  '[ ]'$   dysuria  '[ ]'$   hematuria PSYCHIATRIC:  '[ ]'$  history of major depression INTEGUMENTARY:  [x ] rashes  '[ ]'$  ulcers CONSTITUTIONAL:  [x ] fever   [x ] chills  PHYSICAL EXAMINATION:  General: The patient is a well-nourished female, in no acute distress. Vital signs are BP 147/96 mmHg  Pulse 74  Resp 14  Ht '5\' 7"'$  (1.702 m)  Wt 122 lb (55.339 kg)  BMI 19.10 kg/m2 Pulmonary: There is a good air exchange bilaterally without wheezing or rales. Abdomen: Soft and non-tender with no masses noted. Well-healed low abdominal incision Musculoskeletal: There are no major deformities.   Neurologic: No focal weakness or paresthesias are detected, Skin: There are no ulcer or rashes noted. Psychiatric: The patient has normal affect. Cardiovascular: There is a regular rate and rhythm without significant murmur appreciated. Pulse status: 2+ radial pulses bilaterally. 2+ right femoral and 2+ right dorsalis pedis pulse. Absent left femoral and absent pedal pulses on the left. Her left foot does have changes of severe arterial insufficiency. Dependent rubor and rolling noted.  I reviewed her CT scan from yesterday and also from the earlier studies and discussed at length with the patient. 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 The scan does not go by and thigh but the superficial femoral and deep femoral artery are normal and open bilaterally.  Severe limb threatening arterial insufficiency of her left leg with ankle arm index from yesterday's noninvasive studies of 0.13. 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 This has been progressive. Have explained the need for revascularization for limb salvage.  Have recommended right to left femorofemoral bypass. I am unable for surgery for approximately 10 days in the have explained this to  the patient. She is scheduled for surgery with Dr. Oneida Alar tomorrow and I have discussed this with him by telephone as well.    Curt Jews [facility] Office: [phone]

## 2015-04-17 NOTE — Progress Notes (Signed)
Patient called back and we were able to complete SDW call.

## 2015-04-17 NOTE — Progress Notes (Signed)
Patient will pick up 2nd case of Ensure Plus on Friday, Apr 20, 2015.

## 2015-04-18 ENCOUNTER — Inpatient Hospital Stay (HOSPITAL_COMMUNITY): Payer: Medicare Other | Admitting: Certified Registered Nurse Anesthetist

## 2015-04-18 ENCOUNTER — Encounter (HOSPITAL_COMMUNITY): Payer: Self-pay | Admitting: *Deleted

## 2015-04-18 ENCOUNTER — Encounter (HOSPITAL_COMMUNITY)
Admission: RE | Disposition: A | Discharge status: Home Health | Payer: Self-pay | Source: Ambulatory Visit | Attending: Vascular Surgery

## 2015-04-18 ENCOUNTER — Inpatient Hospital Stay (HOSPITAL_COMMUNITY)
Admission: RE | Admit: 2015-04-18 | Discharge: 2015-04-20 | DRG: 253 | Disposition: A | Discharge status: Home Health | Payer: Medicare Other | Source: Ambulatory Visit | Attending: Vascular Surgery | Admitting: Vascular Surgery

## 2015-04-18 DIAGNOSIS — I700 Atherosclerosis of aorta: Secondary | ICD-10-CM | POA: Diagnosis present

## 2015-04-18 DIAGNOSIS — Z791 Long term (current) use of non-steroidal anti-inflammatories (NSAID): Secondary | ICD-10-CM | POA: Diagnosis not present

## 2015-04-18 DIAGNOSIS — Z886 Allergy status to analgesic agent status: Secondary | ICD-10-CM | POA: Diagnosis not present

## 2015-04-18 DIAGNOSIS — Z79899 Other long term (current) drug therapy: Secondary | ICD-10-CM | POA: Diagnosis not present

## 2015-04-18 DIAGNOSIS — C189 Malignant neoplasm of colon, unspecified: Secondary | ICD-10-CM

## 2015-04-18 DIAGNOSIS — Z9889 Other specified postprocedural states: Secondary | ICD-10-CM | POA: Diagnosis not present

## 2015-04-18 DIAGNOSIS — I998 Other disorder of circulatory system: Secondary | ICD-10-CM | POA: Diagnosis not present

## 2015-04-18 DIAGNOSIS — Z90710 Acquired absence of both cervix and uterus: Secondary | ICD-10-CM | POA: Diagnosis not present

## 2015-04-18 DIAGNOSIS — Z79891 Long term (current) use of opiate analgesic: Secondary | ICD-10-CM | POA: Diagnosis not present

## 2015-04-18 DIAGNOSIS — C562 Malignant neoplasm of left ovary: Secondary | ICD-10-CM

## 2015-04-18 DIAGNOSIS — I10 Essential (primary) hypertension: Secondary | ICD-10-CM | POA: Diagnosis present

## 2015-04-18 DIAGNOSIS — Z85038 Personal history of other malignant neoplasm of large intestine: Secondary | ICD-10-CM | POA: Diagnosis not present

## 2015-04-18 DIAGNOSIS — J45909 Unspecified asthma, uncomplicated: Secondary | ICD-10-CM | POA: Diagnosis present

## 2015-04-18 DIAGNOSIS — M1990 Unspecified osteoarthritis, unspecified site: Secondary | ICD-10-CM | POA: Diagnosis not present

## 2015-04-18 DIAGNOSIS — I745 Embolism and thrombosis of iliac artery: Secondary | ICD-10-CM | POA: Diagnosis present

## 2015-04-18 DIAGNOSIS — Z9049 Acquired absence of other specified parts of digestive tract: Secondary | ICD-10-CM | POA: Diagnosis present

## 2015-04-18 DIAGNOSIS — I70222 Atherosclerosis of native arteries of extremities with rest pain, left leg: Secondary | ICD-10-CM | POA: Diagnosis not present

## 2015-04-18 DIAGNOSIS — I739 Peripheral vascular disease, unspecified: Secondary | ICD-10-CM | POA: Diagnosis not present

## 2015-04-18 DIAGNOSIS — K219 Gastro-esophageal reflux disease without esophagitis: Secondary | ICD-10-CM | POA: Diagnosis not present

## 2015-04-18 DIAGNOSIS — Z885 Allergy status to narcotic agent status: Secondary | ICD-10-CM | POA: Diagnosis not present

## 2015-04-18 DIAGNOSIS — Z880 Allergy status to penicillin: Secondary | ICD-10-CM | POA: Diagnosis not present

## 2015-04-18 HISTORY — DX: Peripheral vascular disease, unspecified: I73.9

## 2015-04-18 HISTORY — PX: FEMORAL-FEMORAL BYPASS GRAFT: SHX936

## 2015-04-18 LAB — PROTIME-INR
INR: 1.07 (ref 0.00–1.49)
Prothrombin Time: 14.1 seconds (ref 11.6–15.2)

## 2015-04-18 LAB — SURGICAL PCR SCREEN
MRSA, PCR: NEGATIVE
Staphylococcus aureus: NEGATIVE

## 2015-04-18 LAB — TYPE AND SCREEN
ABO/RH(D): B POS
Antibody Screen: NEGATIVE

## 2015-04-18 LAB — APTT: APTT: 32 s (ref 24–37)

## 2015-04-18 SURGERY — CREATION, BYPASS, ARTERIAL, FEMORAL TO FEMORAL, USING GRAFT
Anesthesia: General | Site: Groin | Laterality: Bilateral

## 2015-04-18 MED ORDER — LACTATED RINGERS IV SOLN
INTRAVENOUS | Status: DC | PRN
  Administered 2015-04-18 (×2): via INTRAVENOUS

## 2015-04-18 MED ORDER — VANCOMYCIN HCL IN DEXTROSE 1-5 GM/200ML-% IV SOLN
1000.0000 mg | Freq: Two times a day (BID) | INTRAVENOUS | Status: DC
  Filled 2015-04-18: qty 200

## 2015-04-18 MED ORDER — LIDOCAINE HCL (CARDIAC) 20 MG/ML IV SOLN
INTRAVENOUS | Status: AC
  Filled 2015-04-18: qty 5

## 2015-04-18 MED ORDER — ROCURONIUM BROMIDE 100 MG/10ML IV SOLN
INTRAVENOUS | Status: DC | PRN
  Administered 2015-04-18: 20 mg via INTRAVENOUS

## 2015-04-18 MED ORDER — MUPIROCIN 2 % EX OINT
1.0000 "application " | TOPICAL_OINTMENT | Freq: Once | CUTANEOUS | Status: AC
  Administered 2015-04-18: 1 via TOPICAL

## 2015-04-18 MED ORDER — PROPOFOL 10 MG/ML IV BOLUS
INTRAVENOUS | Status: DC | PRN
  Administered 2015-04-18: 200 mg via INTRAVENOUS

## 2015-04-18 MED ORDER — EPHEDRINE SULFATE 50 MG/ML IJ SOLN
INTRAMUSCULAR | Status: DC | PRN
  Administered 2015-04-18: 10 mg via INTRAVENOUS

## 2015-04-18 MED ORDER — LIDOCAINE HCL (CARDIAC) 20 MG/ML IV SOLN
INTRAVENOUS | Status: DC | PRN
  Administered 2015-04-18: 80 mg via INTRAVENOUS

## 2015-04-18 MED ORDER — ENOXAPARIN SODIUM 30 MG/0.3ML ~~LOC~~ SOLN
30.0000 mg | SUBCUTANEOUS | Status: DC
  Administered 2015-04-19: 30 mg via SUBCUTANEOUS
  Filled 2015-04-18 (×3): qty 0.3

## 2015-04-18 MED ORDER — ONDANSETRON HCL 4 MG PO TABS: 8.0000 mg | ORAL_TABLET | Freq: Three times a day (TID) | ORAL | Status: DC | PRN

## 2015-04-18 MED ORDER — FERROUS SULFATE 325 (65 FE) MG PO TABS
325.0000 mg | ORAL_TABLET | Freq: Two times a day (BID) | ORAL | Status: DC
  Administered 2015-04-19 – 2015-04-20 (×3): 325 mg via ORAL
  Filled 2015-04-18 (×5): qty 1

## 2015-04-18 MED ORDER — DOCUSATE SODIUM 100 MG PO CAPS: 100.0000 mg | ORAL_CAPSULE | Freq: Two times a day (BID) | ORAL | Status: DC | PRN

## 2015-04-18 MED ORDER — HYDROCODONE-ACETAMINOPHEN 5-325 MG PO TABS
1.0000 | ORAL_TABLET | Freq: Four times a day (QID) | ORAL | Status: DC | PRN
  Administered 2015-04-18 – 2015-04-20 (×5): 2 via ORAL
  Filled 2015-04-18 (×6): qty 2

## 2015-04-18 MED ORDER — NEOSTIGMINE METHYLSULFATE 10 MG/10ML IV SOLN
INTRAVENOUS | Status: DC | PRN
  Administered 2015-04-18: 3 mg via INTRAVENOUS

## 2015-04-18 MED ORDER — LABETALOL HCL 5 MG/ML IV SOLN
10.0000 mg | INTRAVENOUS | Status: DC | PRN
  Filled 2015-04-18: qty 4

## 2015-04-18 MED ORDER — HEPARIN SODIUM (PORCINE) 1000 UNIT/ML IJ SOLN
INTRAMUSCULAR | Status: AC
  Filled 2015-04-18: qty 1

## 2015-04-18 MED ORDER — HYDROMORPHONE HCL 1 MG/ML IJ SOLN
0.5000 mg | INTRAMUSCULAR | Status: AC | PRN
  Administered 2015-04-18 (×4): 0.5 mg via INTRAVENOUS

## 2015-04-18 MED ORDER — DOCUSATE SODIUM 100 MG PO CAPS
100.0000 mg | ORAL_CAPSULE | Freq: Every day | ORAL | Status: DC
  Administered 2015-04-19 – 2015-04-20 (×2): 100 mg via ORAL
  Filled 2015-04-18 (×2): qty 1

## 2015-04-18 MED ORDER — POTASSIUM CHLORIDE CRYS ER 20 MEQ PO TBCR: 20.0000 meq | EXTENDED_RELEASE_TABLET | Freq: Every day | ORAL | Status: DC | PRN

## 2015-04-18 MED ORDER — GLYCOPYRROLATE 0.2 MG/ML IJ SOLN
INTRAMUSCULAR | Status: AC
  Filled 2015-04-18: qty 1

## 2015-04-18 MED ORDER — HYDRALAZINE HCL 20 MG/ML IJ SOLN: 5.0000 mg | INTRAMUSCULAR | Status: DC | PRN

## 2015-04-18 MED ORDER — SUCCINYLCHOLINE CHLORIDE 20 MG/ML IJ SOLN
INTRAMUSCULAR | Status: DC | PRN
  Administered 2015-04-18: 100 mg via INTRAVENOUS

## 2015-04-18 MED ORDER — PROPOFOL 10 MG/ML IV BOLUS
INTRAVENOUS | Status: AC
  Filled 2015-04-18: qty 20

## 2015-04-18 MED ORDER — METOPROLOL TARTRATE 1 MG/ML IV SOLN
2.0000 mg | INTRAVENOUS | Status: DC | PRN
Start: 2015-04-18 — End: 2015-04-20

## 2015-04-18 MED ORDER — DEXAMETHASONE SODIUM PHOSPHATE 4 MG/ML IJ SOLN
INTRAMUSCULAR | Status: DC | PRN
  Administered 2015-04-18: 8 mg via INTRAVENOUS

## 2015-04-18 MED ORDER — SODIUM CHLORIDE 0.9 % IV SOLN: 500.0000 mL | Freq: Once | INTRAVENOUS | Status: AC | PRN

## 2015-04-18 MED ORDER — ALUM & MAG HYDROXIDE-SIMETH 200-200-20 MG/5ML PO SUSP: 15.0000 mL | ORAL | Status: DC | PRN

## 2015-04-18 MED ORDER — FENTANYL CITRATE (PF) 250 MCG/5ML IJ SOLN
INTRAMUSCULAR | Status: AC
  Filled 2015-04-18: qty 5

## 2015-04-18 MED ORDER — MIDAZOLAM HCL 2 MG/2ML IJ SOLN
INTRAMUSCULAR | Status: AC
  Filled 2015-04-18: qty 2

## 2015-04-18 MED ORDER — THROMBIN 20000 UNITS EX SOLR
CUTANEOUS | Status: AC
  Filled 2015-04-18: qty 20000

## 2015-04-18 MED ORDER — VANCOMYCIN HCL IN DEXTROSE 1-5 GM/200ML-% IV SOLN
1000.0000 mg | INTRAVENOUS | Status: AC
  Administered 2015-04-18: 1000 mg via INTRAVENOUS
  Filled 2015-04-18: qty 200

## 2015-04-18 MED ORDER — LACTATED RINGERS IV SOLN
INTRAVENOUS | Status: DC
  Administered 2015-04-18: 11:00:00 via INTRAVENOUS

## 2015-04-18 MED ORDER — GUAIFENESIN-DM 100-10 MG/5ML PO SYRP: 15.0000 mL | ORAL_SOLUTION | ORAL | Status: DC | PRN

## 2015-04-18 MED ORDER — GLYCOPYRROLATE 0.2 MG/ML IJ SOLN
INTRAMUSCULAR | Status: DC | PRN
  Administered 2015-04-18: 0.4 mg via INTRAVENOUS

## 2015-04-18 MED ORDER — PANTOPRAZOLE SODIUM 40 MG PO TBEC
40.0000 mg | DELAYED_RELEASE_TABLET | Freq: Every day | ORAL | Status: DC
  Administered 2015-04-18 – 2015-04-20 (×3): 40 mg via ORAL
  Filled 2015-04-18 (×3): qty 1

## 2015-04-18 MED ORDER — PREGABALIN 100 MG PO CAPS
200.0000 mg | ORAL_CAPSULE | Freq: Three times a day (TID) | ORAL | Status: DC
  Administered 2015-04-18 – 2015-04-20 (×5): 200 mg via ORAL
  Filled 2015-04-18 (×5): qty 2

## 2015-04-18 MED ORDER — SODIUM CHLORIDE 0.9 % IV SOLN
INTRAVENOUS | Status: DC
  Administered 2015-04-18: 20:00:00 via INTRAVENOUS

## 2015-04-18 MED ORDER — ONDANSETRON HCL 4 MG/2ML IJ SOLN
INTRAMUSCULAR | Status: DC | PRN
  Administered 2015-04-18: 4 mg via INTRAVENOUS

## 2015-04-18 MED ORDER — WHITE PETROLATUM GEL
Status: AC
  Administered 2015-04-18: 0.2
  Filled 2015-04-18: qty 1

## 2015-04-18 MED ORDER — HYDROMORPHONE HCL 1 MG/ML IJ SOLN
INTRAMUSCULAR | Status: AC
  Administered 2015-04-18: 0.5 mg via INTRAVENOUS
  Filled 2015-04-18: qty 1

## 2015-04-18 MED ORDER — DOXYCYCLINE HYCLATE 100 MG PO CAPS: 100.0000 mg | ORAL_CAPSULE | Freq: Two times a day (BID) | ORAL | Status: DC

## 2015-04-18 MED ORDER — VANCOMYCIN HCL IN DEXTROSE 750-5 MG/150ML-% IV SOLN
750.0000 mg | Freq: Two times a day (BID) | INTRAVENOUS | Status: AC
  Administered 2015-04-18 – 2015-04-19 (×2): 750 mg via INTRAVENOUS
  Filled 2015-04-18 (×2): qty 150

## 2015-04-18 MED ORDER — ONDANSETRON HCL 4 MG/2ML IJ SOLN
4.0000 mg | Freq: Four times a day (QID) | INTRAMUSCULAR | Status: DC | PRN
  Administered 2015-04-19: 4 mg via INTRAVENOUS
  Filled 2015-04-18: qty 2

## 2015-04-18 MED ORDER — ONDANSETRON HCL 4 MG/2ML IJ SOLN
4.0000 mg | Freq: Once | INTRAMUSCULAR | Status: AC | PRN
  Administered 2015-04-18: 4 mg via INTRAVENOUS

## 2015-04-18 MED ORDER — MUPIROCIN 2 % EX OINT
TOPICAL_OINTMENT | CUTANEOUS | Status: AC
  Filled 2015-04-18: qty 22

## 2015-04-18 MED ORDER — PROTAMINE SULFATE 10 MG/ML IV SOLN
INTRAVENOUS | Status: AC
  Filled 2015-04-18: qty 5

## 2015-04-18 MED ORDER — ONDANSETRON HCL 4 MG/2ML IJ SOLN
INTRAMUSCULAR | Status: AC
  Filled 2015-04-18: qty 2

## 2015-04-18 MED ORDER — CHLORHEXIDINE GLUCONATE CLOTH 2 % EX PADS: 6.0000 | MEDICATED_PAD | Freq: Once | CUTANEOUS | Status: DC

## 2015-04-18 MED ORDER — PHENOL 1.4 % MT LIQD: 1.0000 | OROMUCOSAL | Status: DC | PRN

## 2015-04-18 MED ORDER — DEXTROSE 5 % IV SOLN
10.0000 mg | INTRAVENOUS | Status: DC | PRN
  Administered 2015-04-18: 20 ug/min via INTRAVENOUS

## 2015-04-18 MED ORDER — HEPARIN SODIUM (PORCINE) 5000 UNIT/ML IJ SOLN
INTRAMUSCULAR | Status: DC | PRN
  Administered 2015-04-18: 13:00:00

## 2015-04-18 MED ORDER — HYDROMORPHONE HCL 1 MG/ML IJ SOLN
0.5000 mg | INTRAMUSCULAR | Status: DC | PRN
  Administered 2015-04-18 – 2015-04-20 (×7): 1 mg via INTRAVENOUS
  Filled 2015-04-18 (×7): qty 1

## 2015-04-18 MED ORDER — BISACODYL 10 MG RE SUPP: 10.0000 mg | Freq: Every day | RECTAL | Status: DC | PRN

## 2015-04-18 MED ORDER — PROTAMINE SULFATE 10 MG/ML IV SOLN
INTRAVENOUS | Status: DC | PRN
  Administered 2015-04-18: 20 mg via INTRAVENOUS
  Administered 2015-04-18 (×4): 10 mg via INTRAVENOUS

## 2015-04-18 MED ORDER — HEPARIN SODIUM (PORCINE) 1000 UNIT/ML IJ SOLN
INTRAMUSCULAR | Status: DC | PRN
  Administered 2015-04-18: 6000 [IU] via INTRAVENOUS

## 2015-04-18 MED ORDER — SODIUM CHLORIDE 0.9 % IV SOLN: INTRAVENOUS | Status: DC

## 2015-04-18 MED ORDER — DEXAMETHASONE SODIUM PHOSPHATE 4 MG/ML IJ SOLN
INTRAMUSCULAR | Status: AC
  Filled 2015-04-18: qty 2

## 2015-04-18 MED ORDER — DOXYCYCLINE HYCLATE 100 MG PO TABS
100.0000 mg | ORAL_TABLET | Freq: Two times a day (BID) | ORAL | Status: DC
Start: 2015-04-18 — End: 2015-04-20
  Administered 2015-04-18 – 2015-04-20 (×4): 100 mg via ORAL
  Filled 2015-04-18 (×5): qty 1

## 2015-04-18 MED ORDER — HYDROCHLOROTHIAZIDE 25 MG PO TABS
25.0000 mg | ORAL_TABLET | Freq: Every morning | ORAL | Status: DC
  Administered 2015-04-18 – 2015-04-20 (×3): 25 mg via ORAL
  Filled 2015-04-18 (×3): qty 1

## 2015-04-18 MED ORDER — LIDOCAINE HCL 4 % MT SOLN
OROMUCOSAL | Status: DC | PRN
  Administered 2015-04-18: 4 mL via TOPICAL

## 2015-04-18 MED ORDER — ROCURONIUM BROMIDE 50 MG/5ML IV SOLN
INTRAVENOUS | Status: AC
  Filled 2015-04-18: qty 1

## 2015-04-18 MED ORDER — GLYCOPYRROLATE 0.2 MG/ML IJ SOLN
INTRAMUSCULAR | Status: AC
  Filled 2015-04-18: qty 2

## 2015-04-18 MED ORDER — 0.9 % SODIUM CHLORIDE (POUR BTL) OPTIME
TOPICAL | Status: DC | PRN
  Administered 2015-04-18: 2000 mL

## 2015-04-18 MED ORDER — MIDAZOLAM HCL 5 MG/5ML IJ SOLN
INTRAMUSCULAR | Status: DC | PRN
  Administered 2015-04-18 (×2): 0.5 mg via INTRAVENOUS
  Administered 2015-04-18: 1 mg via INTRAVENOUS

## 2015-04-18 MED ORDER — FENTANYL CITRATE (PF) 100 MCG/2ML IJ SOLN
INTRAMUSCULAR | Status: DC | PRN
  Administered 2015-04-18: 25 ug via INTRAVENOUS
  Administered 2015-04-18: 75 ug via INTRAVENOUS
  Administered 2015-04-18: 125 ug via INTRAVENOUS
  Administered 2015-04-18: 50 ug via INTRAVENOUS
  Administered 2015-04-18: 25 ug via INTRAVENOUS
  Administered 2015-04-18: 50 ug via INTRAVENOUS

## 2015-04-18 SURGICAL SUPPLY — 46 items
BAG ISL DRAPE 18X18 STRL (DRAPES) ×1
BAG ISOLATION DRAPE 18X18 (DRAPES) ×1 IMPLANT
CANISTER SUCTION 2500CC (MISCELLANEOUS) ×2 IMPLANT
CANNULA VESSEL 3MM 2 BLNT TIP (CANNULA) ×4 IMPLANT
CATH EMB 3FR 80CM (CATHETERS) ×1 IMPLANT
CATH EMB 4FR 80CM (CATHETERS) ×1 IMPLANT
CLIP TI MEDIUM 24 (CLIP) ×2 IMPLANT
CLIP TI WIDE RED SMALL 24 (CLIP) ×2 IMPLANT
DRAIN SNY WOU (WOUND CARE) IMPLANT
DRAPE ISOLATION BAG 18X18 (DRAPES) ×1
DRAPE PROXIMA HALF (DRAPES) ×1 IMPLANT
ELECT REM PT RETURN 9FT ADLT (ELECTROSURGICAL) ×2
ELECTRODE REM PT RTRN 9FT ADLT (ELECTROSURGICAL) ×1 IMPLANT
EVACUATOR SILICONE 100CC (DRAIN) IMPLANT
GLOVE BIO SURGEON STRL SZ 6.5 (GLOVE) ×5 IMPLANT
GLOVE BIO SURGEON STRL SZ7.5 (GLOVE) ×2 IMPLANT
GLOVE BIOGEL PI IND STRL 6.5 (GLOVE) IMPLANT
GLOVE BIOGEL PI INDICATOR 6.5 (GLOVE) ×1
GLOVE SURG SS PI 6.5 STRL IVOR (GLOVE) ×2 IMPLANT
GOWN STRL REUS W/ TWL LRG LVL3 (GOWN DISPOSABLE) ×3 IMPLANT
GOWN STRL REUS W/TWL LRG LVL3 (GOWN DISPOSABLE) ×6
GRAFT HEMASHIELD 8MM (Vascular Products) ×2 IMPLANT
GRAFT VASC STRG 30X8KNIT (Vascular Products) IMPLANT
KIT BASIN OR (CUSTOM PROCEDURE TRAY) ×2 IMPLANT
KIT ROOM TURNOVER OR (KITS) ×2 IMPLANT
LIQUID BAND (GAUZE/BANDAGES/DRESSINGS) ×1 IMPLANT
LOOP VESSEL MINI RED (MISCELLANEOUS) ×2 IMPLANT
NS IRRIG 1000ML POUR BTL (IV SOLUTION) ×4 IMPLANT
PACK PERIPHERAL VASCULAR (CUSTOM PROCEDURE TRAY) ×2 IMPLANT
PAD ARMBOARD 7.5X6 YLW CONV (MISCELLANEOUS) ×4 IMPLANT
SPONGE SURGIFOAM ABS GEL 100 (HEMOSTASIS) IMPLANT
STAPLER VISISTAT 35W (STAPLE) IMPLANT
SUT PROLENE 5 0 C 1 24 (SUTURE) ×4 IMPLANT
SUT PROLENE 6 0 CC (SUTURE) ×2 IMPLANT
SUT SILK 3 0 (SUTURE)
SUT SILK 3-0 18XBRD TIE 12 (SUTURE) IMPLANT
SUT VIC AB 2-0 CT1 27 (SUTURE) ×4
SUT VIC AB 2-0 CT1 TAPERPNT 27 (SUTURE) ×2 IMPLANT
SUT VIC AB 3-0 SH 27 (SUTURE) ×4
SUT VIC AB 3-0 SH 27X BRD (SUTURE) ×2 IMPLANT
SUT VICRYL 4-0 PS2 18IN ABS (SUTURE) ×4 IMPLANT
SYR 3ML LL SCALE MARK (SYRINGE) ×1 IMPLANT
TAPE UMBILICAL COTTON 1/8X30 (MISCELLANEOUS) IMPLANT
TRAY FOLEY CATH 16FRSI W/METER (SET/KITS/TRAYS/PACK) ×2 IMPLANT
UNDERPAD 30X30 INCONTINENT (UNDERPADS AND DIAPERS) ×2 IMPLANT
WATER STERILE IRR 1000ML POUR (IV SOLUTION) ×2 IMPLANT

## 2015-04-18 NOTE — H&P (View-Only) (Signed)
Patient name: Catherine Rogers MRN: [id number] DOB: [birth year] Sex: female   Referred by: Alvino Chapel  Reason for referral:  Chief Complaint  Patient presents with  . New Evaluation    f/u from ED    HISTORY OF PRESENT ILLNESS: Patient resents today for evaluation of left leg ischemia. She has an extremely complex past history. She is very pleasant 54 year old female who was found to have a mass in her cecum in the October 2015. This appeared to be a colon cancer with ileocecal intussusception. She underwent resection of this electively was found to have colon cancer. Subsequently in December 2015 she developed a left pelvic mass. 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 She subsequently underwent laparoscopy and the removal of her right ovary by OB/GYN in February 2016. She reports that following her initial pelvic surgery in the October she began having discomfort in her left leg. This is progressed since this time. She has had multiple duplexes to rule out DVT and these of all been negative. She is seeing neurology with an extensive neurologic workup as well. She presented to the [facility] long emergency department yesterday for evaluation of worsening pain. 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 Interestingly in reviewing her initial CT scan in October 2015, she did have occlusion of the origin of her left common iliac at that time. She has had extension of the iliac occlusion. She reports severe classic rest pain. She is awakened multiple times during the night and has to hang her leg in a dependent fashion for relief. She is difficult time walking related to this.  Past Medical History  Diagnosis Date  . Hypertension   . Ectopic pregnancy   . Head injury, closed, with brief LOC     had a cut- as a child  . Asthma   . Anxiety    . Depression   . PTSD (post-traumatic stress disorder)   . GERD (gastroesophageal reflux disease)   . Arthritis   . Pelvic cyst   . Complication of anesthesia     woke and saw long tube coming out of mouth . sat up then went back to sleep  . PONV (postoperative nausea and vomiting)   . Family history of adverse reaction to anesthesia     sister on dialysis- hypotension   . Headache   . Cancer     colon and ovarian  . Anemia   . History of blood transfusion     2005 approximately   . Family history of ovarian cancer     Past Surgical History  Procedure Laterality Date  . Esophagogastroduodenoscopy N/A 09/17/2014    Procedure: ESOPHAGOGASTRODUODENOSCOPY (EGD);  Surgeon: Jeryl Columbia, MD;  Location: [facility] ENDOSCOPY;  Service: Endoscopy;  Laterality: N/A;  . Abdominal hysterectomy    . Tubal ligation    . Partial colectomy Right 09/18/2014    Procedure: RIGHT HEMI COLECTOMY;  Surgeon: Doreen Salvage, MD;  Location: [place];  Service: General;  Laterality: Right;  . Ileostomy Right 09/18/2014    Procedure: ILEOCOLOSTOMY;  Surgeon: Doreen Salvage, MD;  Location: [place];  Service: General;  Laterality: Right;  . Laparotomy N/A 11/01/2014    Procedure: EXPLORATORY LAPAROTOMY;  Surgeon: Doreen Salvage, MD;  Location: [place];  Service: General;  Laterality: N/A;  . Mass  excision  11/01/2014    Procedure: EXCISION PELVIC MASS;  Surgeon: Doreen Salvage, MD;  Location: [place];  Service: General;;  . Robotic assisted total hysterectomy with bilateral salpingo oopherectomy Right 12/26/2014    Procedure: EXPLORATORY LAPAROSCOPY  RIGHT SALPINGO OOPHORECTOMY OMENTECTOMY LYMPHADECTOMY ;  Surgeon: Everitt Amber, MD;  Location: [facility];  Service: Gynecology;  Laterality: Right;  . Laparotomy N/A 12/26/2014    Procedure:  LAPAROTOMY ;  Surgeon: Everitt Amber, MD;  Location: [facility];  Service: Gynecology;  Laterality: N/A;  . Oophorectomy  2016    History   Social History  . Marital Status: Widowed    Spouse Name: N/A  . Number of  Children: 6  . Years of Education: 13   Occupational History  . Disability    Social History Main Topics  . Smoking status: Never Smoker   . Smokeless tobacco: Never Used     Comment: pack per month of cigars   . Alcohol Use: No     Comment: socially   . Drug Use: No     Comment: smokes marijuana every 2-3 days, last use in january    . Sexual Activity: No   Other Topics Concern  . Not on file   Social History Narrative   Born and raised in [place], [place]. Currently resides in a house by herself. No pets. Fun: Read, shoot pool.    Denies religious beliefs that would effect health care.    Lives at home alone.   Right-handed.   No caffeine use.    Family History  Problem Relation Age of Onset  . Brain cancer Sister 32  . Lung cancer Brother 28  . Heart attack Mother   . Heart attack Father     36s  . Heart attack Maternal Uncle   . Diabetes Maternal Uncle   . Ovarian cancer Paternal Aunt     3 paternal aunts with ovarian cancer  . Diabetes Paternal Uncle   . Heart attack Brother     Allergies as of 04/17/2015 - Review Complete 04/17/2015  Allergen Reaction Noted  . Other Hives and Itching 09/18/2014  . Demerol [meperidine] Nausea And Vomiting 09/16/2014  . Ibuprofen Nausea And Vomiting 12/27/2014  . Morphine and related Nausea And Vomiting 09/16/2014  . Oxycodone Itching 01/11/2015  . Penicillins Other (See Comments) 09/16/2014  . Tylenol [acetaminophen] Nausea And Vomiting 12/27/2014    Current Outpatient Prescriptions on File Prior to Visit  Medication Sig Dispense Refill  . docusate sodium 100 MG CAPS Take 100 mg by mouth 2 (two) times daily as needed for mild constipation. (Patient taking differently: Take 100 mg by mouth 2 (two) times daily as needed (constipation). ) 10 capsule 0  . doxycycline (VIBRAMYCIN) 100 MG capsule Take 1 capsule (100 mg total) by mouth 2 (two) times daily. 10 capsule 0  . fentaNYL (DURAGESIC) 50 MCG/HR Place 1 patch (50 mcg  total) onto the skin every 3 (three) days. 11 patch 0  . ferrous sulfate 325 (65 FE) MG tablet Take 1 tablet (325 mg total) by mouth 2 (two) times daily with a meal. 60 tablet 3  . hydrochlorothiazide (HYDRODIURIL) 25 MG tablet Take 25 mg by mouth every morning.     [name] HYDROcodone-acetaminophen (LORTAB) 7.5-500 MG per tablet Take 1 tablet by mouth every 6 (six) hours as needed for pain. (Patient taking differently: Take 1 tablet by mouth every 6 (six) hours as needed for pain. ) 90 tablet 0  . ondansetron (  ZOFRAN) 8 MG tablet Take 1 tablet (8 mg total) by mouth every 8 (eight) hours as needed for nausea or vomiting. 20 tablet 3  . pregabalin (LYRICA) 100 MG capsule 2 tabs tid 180 capsule 6  . enoxaparin (LOVENOX) 40 MG/0.4ML injection Inject 0.4 mLs (40 mg total) into the skin daily. (Patient not taking: Reported on 04/16/2015) 25 Syringe 0  . gabapentin (NEURONTIN) 100 MG capsule Take 1 capsule (100 mg total) by mouth 2 (two) times daily. (Patient not taking: Reported on 04/16/2015) 60 capsule 1  . ibuprofen (ADVIL,MOTRIN) 800 MG tablet Take 800 mg by mouth every 8 (eight) hours as needed for headache, mild pain or moderate pain.    . metroNIDAZOLE (FLAGYL) 500 MG tablet Take 1 tablet (500 mg total) by mouth 3 (three) times daily. (Patient not taking: Reported on 04/16/2015) 30 tablet 0   No current facility-administered medications on file prior to visit.     REVIEW OF SYSTEMS:  Positives indicated with an "X"  CARDIOVASCULAR:  '[ ]'$  chest pain   '[ ]'$  chest pressure   '[ ]'$  palpitations   '[ ]'$  orthopnea   '[ ]'$  dyspnea on exertion   [x ] claudication   [ x] rest pain   '[ ]'$  DVT   '[ ]'$  phlebitis PULMONARY:   '[ ]'$  productive cough   '[ ]'$  asthma   '[ ]'$  wheezing NEUROLOGIC:   x'[ ]'$  weakness  [ x] paresthesias  '[ ]'$  aphasia  '[ ]'$  amaurosis  '[ ]'$  dizziness HEMATOLOGIC:   '[ ]'$  bleeding problems   '[ ]'$  clotting disorders MUSCULOSKELETAL:  '[ ]'$  joint pain   '[ ]'$  joint swelling GASTROINTESTINAL: '[ ]'$   blood in stool  '[ ]'$    hematemesis GENITOURINARY:  '[ ]'$   dysuria  '[ ]'$   hematuria PSYCHIATRIC:  '[ ]'$  history of major depression INTEGUMENTARY:  [x ] rashes  '[ ]'$  ulcers CONSTITUTIONAL:  [x ] fever   [x ] chills  PHYSICAL EXAMINATION:  General: The patient is a well-nourished female, in no acute distress. Vital signs are BP 147/96 mmHg  Pulse 74  Resp 14  Ht '5\' 7"'$  (1.702 m)  Wt 122 lb (55.339 kg)  BMI 19.10 kg/m2 Pulmonary: There is a good air exchange bilaterally without wheezing or rales. Abdomen: Soft and non-tender with no masses noted. Well-healed low abdominal incision Musculoskeletal: There are no major deformities.   Neurologic: No focal weakness or paresthesias are detected, Skin: There are no ulcer or rashes noted. Psychiatric: The patient has normal affect. Cardiovascular: There is a regular rate and rhythm without significant murmur appreciated. Pulse status: 2+ radial pulses bilaterally. 2+ right femoral and 2+ right dorsalis pedis pulse. Absent left femoral and absent pedal pulses on the left. Her left foot does have changes of severe arterial insufficiency. Dependent rubor and rolling noted.  I reviewed her CT scan from yesterday and also from the earlier studies and discussed at length with the patient. 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 The scan does not go by and thigh but the superficial femoral and deep femoral artery are normal and open bilaterally.  Severe limb threatening arterial insufficiency of her left leg with ankle arm index from yesterday's noninvasive studies of 0.13. 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 This has been progressive. Have explained the need for revascularization for limb salvage.  Have recommended right to left femorofemoral bypass. I am unable for surgery for approximately 10 days in the have explained this to  the patient. She is scheduled for surgery with Dr. Oneida Alar tomorrow and I have discussed this with him by telephone as well.    Curt Jews [facility] [place] Office: [phone]

## 2015-04-18 NOTE — Progress Notes (Signed)
Notified Dr. Tamala Julian of pt. Eating a pinch of a sandwich this am at 0700. Sandwich had egg/sausage/cheese on it.

## 2015-04-18 NOTE — Anesthesia Procedure Notes (Signed)
Procedure Name: Intubation Date/Time: 04/18/2015 12:26 PM Performed by: Garrison Columbus T Pre-anesthesia Checklist: Patient identified, Emergency Drugs available, Suction available and Patient being monitored Patient Re-evaluated:Patient Re-evaluated prior to inductionOxygen Delivery Method: Circle system utilized Preoxygenation: Pre-oxygenation with 100% oxygen Intubation Type: IV induction Ventilation: Mask ventilation without difficulty Laryngoscope Size: Mac and 3 Grade View: Grade I Tube type: Oral Tube size: 7.5 mm Number of attempts: 1 Airway Equipment and Method: Stylet and LTA kit utilized Placement Confirmation: ETT inserted through vocal cords under direct vision,  positive ETCO2 and breath sounds checked- equal and bilateral Secured at: 22 cm Tube secured with: Tape Dental Injury: Teeth and Oropharynx as per pre-operative assessment

## 2015-04-18 NOTE — Progress Notes (Signed)
Pharmacy consult - Antibiotic renal dose adjustment.  66 YOF s/p right to left femoral-femoral bypass, right common and superficial femoral endarterectomy. On vancomycin for post-op prophylaxis. Pt. Received pre-op dose 1g vancomycin at 1200. Est. crcl ~ 70 ml/min. Pt. Is also on doxycycline, no renal adjustment needed.  Plan: - Adjust vancomycin to 750 mg IV Q 12 hrs, next dose 2300 - Pharmacy sign off  Thanks.  Maryanna Shape, PharmD, BCPS  Clinical Pharmacist  Pager: [phone]

## 2015-04-18 NOTE — Interval H&P Note (Signed)
History and Physical Interval Note:  04/18/2015 11:51 AM  Catherine Rogers  has presented today for surgery, with the diagnosis of Left Foot Ischemia   The various methods of treatment have been discussed with the patient and family. After consideration of risks, benefits and other options for treatment, the patient has consented to  Procedure(s):  RIGHT TO LEFT FEMORAL-FEMORAL ARTERY BYPASS GRAFT (Bilateral) as a surgical intervention .  The patient's history has been reviewed, patient examined, no change in status, stable for surgery.  I have reviewed the patient's chart and labs.  Questions were answered to the patient's satisfaction.     Ruta Hinds

## 2015-04-18 NOTE — Transfer of Care (Signed)
Immediate Anesthesia Transfer of Care Note  Patient: Catherine Rogers  Procedure(s) Performed: Procedure(s):  RIGHT COMMON FEMORAL TO LEFT COMMON  FEMORAL  ARTERY BYPASS WITH  8 MM HEMASHIELD GRAFT,RIGHT COMMON SUPERFICIAL FEMORAL ARTERY ENDARTERECTOMY (Bilateral)  Patient Location: PACU  Anesthesia Type:General  Level of Consciousness: awake, alert  and oriented  Airway & Oxygen Therapy: Patient Spontanous Breathing and Patient connected to nasal cannula oxygen  Post-op Assessment: Report given to RN and Post -op Vital signs reviewed and stable  Post vital signs: Reviewed and stable  Last Vitals:  Filed Vitals:   04/18/15 0933  BP: 145/97  Pulse: 69  Temp: 36.7 C  Resp: 14    Complications: No apparent anesthesia complications

## 2015-04-18 NOTE — Anesthesia Preprocedure Evaluation (Addendum)
Anesthesia Evaluation  Patient identified by MRN, date of birth, ID band Patient awake    Reviewed: Allergy & Precautions, NPO status , Patient's Chart, lab work & pertinent test results  History of Anesthesia Complications (+) PONV  Airway Mallampati: I       Dental  (+) Missing, Poor Dentition, Dental Advisory Given, Chipped,    Pulmonary asthma ,    Pulmonary exam normal       Cardiovascular hypertension, + Peripheral Vascular Disease Normal cardiovascular examRate:Normal     Neuro/Psych  Headaches,  Neuromuscular disease    GI/Hepatic GERD-  ,  Endo/Other    Renal/GU      Musculoskeletal  (+) Arthritis -,   Abdominal   Peds  Hematology  (+) anemia ,   Anesthesia Other Findings   Reproductive/Obstetrics                           Anesthesia Physical Anesthesia Plan  ASA: III  Anesthesia Plan: General   Post-op Pain Management:    Induction: Intravenous  Airway Management Planned: Oral ETT  Additional Equipment:   Intra-op Plan:   Post-operative Plan: Extubation in OR  Informed Consent: I have reviewed the patients History and Physical, chart, labs and discussed the procedure including the risks, benefits and alternatives for the proposed anesthesia with the patient or authorized representative who has indicated his/her understanding and acceptance.     Plan Discussed with: CRNA, Anesthesiologist and Surgeon  Anesthesia Plan Comments:         Anesthesia Quick Evaluation

## 2015-04-18 NOTE — Op Note (Signed)
Procedure: Right to left femoral-femoral bypass, right common and superficial femoral endarterectomy  Preoperative diagnosis: Left lower extremity claudication with rest pain Postoperative diagnosis: Same  Anesthesia: Gen.  Assistant: Leontine Locket PA-C  Upper findings: 8 mm Dacron graft  Operative details: After obtaining informed consent, the patient was taken to the operating room. The patient was placed in supine position on theoperating room table. After induction of general anesthesia, a Foley catheter was placed. Next the patient was prepped and draped in the usual sterile fashion from the umbilicus to the toes. A longitudinal incision was made in the right groin.  The incision was taken through the subcutaneous tissues down to level of right common femoral artery. The profunda femoris and superficial femoral artery dissected free circumferentially. These were fairly small. The common femoral was 4-5 mm the profunda was 2 mm.   The common femoral did have a good quality pulse. They were thickened on palpation. Vessel loops were placed around all these. Attention was then turned to the left groin. In similar fashion a longitudinal incision was made in the left groin.  The profunda femoris and superficial femoral arteries were dissected free circumferentially. The common femoral was also dissected free circumferentially.  Vessel loops were placed around all these. Next a subcutaneous tunnel was created over the suprapubic region connecting the left and right groin incisions. An 8 mm Dacron graft was brought through this. The patient was given 6000 units of intravenous heparin. The right common femoral was controlled with a Henley clamp. The superficial femoral and profunda femoris arteries were controlled with fine bulldog clamps. A longitudinal opening was made in the right common femoral artery.  The lumen was very small due to a large amount of plaque in this small vessel and the origin of the  profunda and SFA were severely narrowed and would have been made worse by creating an anastomosis in this state so I proceed to do an endarterectomy of the common femoral and obtained a nice feathered endpoint in the profunda.  I had to evert the SFA over several centimeters but was also able to obtain a good endpoint here. The new 8 mm Dacron graft was spatulated and sewn end of graft to side of the common femoral artery with a running 6 0 prolene suture. Just prior to completion of the anastomosis, it was forebled backbled and thoroughly flushed. The anastomosis was secured and clamps released and there was good pulsatile flow in the graft immediately. Hemostasis was obtained. Attention was then turned to the left groin. The native common femoral artery was controlled proximally with a Henley clamp. The common femoral was controlled with a Henley clamp, the SFA with a fine bulldog and the profunda with a vessel loop.  The fem fem was controlled with a fistula clamp.   A longitudinal opening was made in the left common femoral artery.  The vessel again was small but with less plaque burden than on the right side.  A #3 and #4 Fogarty catheter was passed down the SFA but only passed to the 30 cm mark suggesting a distal SFA or popliteal stenosis or occlusion.   No thrombus was retrieved.  There was some flow down the native left external iliac but this was non pulsatile.   The new 8 mm Dacron graft was cut to length and beveled. This was then sewn end of graft to side of native common femoral artery using a running 6-0 Prolene suture. Just prior to completion of the  anastomosis it was forebled backbled and thoroughly flushed. the anastomosis was secured; clamps released; and there was pulsatile flow in the graft immediately. One repair stitch was placed in the toe of the anastomosis.  Hemostasis was obtained.  The patient had good posterior tibial and dorsalis pedis Doppler signals bilaterally.  There was also good  pulsatile flow in the distal superficial femoral artery at the level of the groin. The patient was given 60 mg of protamine for heparin reversal.  The groin was then closed in multiple layers using running 3 0 Vicryl suture. The skin of both incisions was closed with a 4 0 Vicryl subcuticular stitch. Liquiband was applied to both incisions. The patient tolerated procedure well and there were no complications. The patient was extubated in the operating room and taken to recovery room in stable condition.  Ruta Hinds, MD [facility] [place] Office: [phone] Pager: [phone]

## 2015-04-19 ENCOUNTER — Inpatient Hospital Stay (HOSPITAL_COMMUNITY): Payer: Medicare Other

## 2015-04-19 ENCOUNTER — Encounter (HOSPITAL_COMMUNITY): Payer: Self-pay | Admitting: Vascular Surgery

## 2015-04-19 ENCOUNTER — Telehealth: Payer: Self-pay | Admitting: Nurse Practitioner

## 2015-04-19 DIAGNOSIS — Z9889 Other specified postprocedural states: Secondary | ICD-10-CM

## 2015-04-19 LAB — BASIC METABOLIC PANEL
Anion gap: 8 (ref 5–15)
BUN: 9 mg/dL (ref 6–20)
CO2: 28 mmol/L (ref 22–32)
CREATININE: 0.74 mg/dL (ref 0.44–1.00)
Calcium: 9 mg/dL (ref 8.9–10.3)
Chloride: 102 mmol/L (ref 101–111)
GFR calc Af Amer: >60 mL/min (ref >60)
GLUCOSE: 122 mg/dL — AB (ref 65–99)
Potassium: 4 mmol/L (ref 3.5–5.1)
Sodium: 138 mmol/L (ref 135–145)

## 2015-04-19 LAB — CBC
HCT: 35.8 % — ABNORMAL LOW (ref 36.0–46.0)
Hemoglobin: 12.8 g/dL (ref 12.0–15.0)
MCH: 34.5 pg — ABNORMAL HIGH (ref 26.0–34.0)
MCHC: 35.8 g/dL (ref 30.0–36.0)
MCV: 96.5 fL (ref 78.0–100.0)
Platelets: 146 10*3/uL — ABNORMAL LOW (ref 150–400)
RBC: 3.71 MIL/uL — ABNORMAL LOW (ref 3.87–5.11)
RDW: 12.4 % (ref 11.5–15.5)
WBC: 6.2 10*3/uL (ref 4.0–10.5)

## 2015-04-19 NOTE — Progress Notes (Signed)
VASCULAR LAB PRELIMINARY   ABI completed:   RIGHT    LEFT    PRESSURE WAVEFORM  PRESSURE WAVEFORM  BRACHIAL 130 Bi BRACHIAL 139 Bi  DP   DP    AT 83 Mono AT  Not audible, can only hear venous  PT 81 Mono PT 71 Mono  PER   PER 66 Mono   GREAT TOE  NA GREAT TOE  NA    RIGHT LEFT  ABI 0.6 0.51     Landry Mellow, RDMS, RVT  04/19/2015, 12:01 PM

## 2015-04-19 NOTE — Telephone Encounter (Signed)
Patient calling to inform gyn onc team that she is currently hospitalized at [facility] for surgery on left leg pain that she believes was caused by gyn-onc surgery. Patient states "I walked in and I couldn't walk out." However, documentation around the time of her surgery verifies the left leg pain was present prior to surgery. Pt states her surgery today "took care of the blockage in my leg."  She simply calls to inform her team of her change in status. Informed Joylene John, NP and MD of patient's message.

## 2015-04-19 NOTE — Evaluation (Signed)
Occupational Therapy Evaluation and Discharge  Patient Details Name: Catherine Rogers MRN: [id number] DOB: 1961-11-15 Today's Date: 04/19/2015    History of Present Illness Pt is a 54 y.o. Female with PMH of HTN, head injury, anxiety, depression, PTSD, arthritis, colon and ovarian CA, with extensive history presenting with left left ischemia. Pt now s/p Right to Left femoral-femoral artery bypass graft on 04/18/15.    Clinical Impression   PTA pt lived at home alone and was independent with use of RW PRN. Pt is limited by LLE pain and decreased ROM and requires supervision for functional mobility and ADLs. Educated on fall prevention and safety with ADLs. No further acute OT needs.     Follow Up Recommendations  No OT follow up    Equipment Recommendations  3 in 1 bedside comode    Recommendations for Other Services       Precautions / Restrictions Precautions Precautions: None      Mobility Bed Mobility               General bed mobility comments: in chair on arrival  Transfers Overall transfer level: Needs assistance Equipment used: Rolling walker (2 wheeled) Transfers: Sit to/from Stand Sit to Stand: Supervision         General transfer comment: Supervision for safety. Good hand placement         ADL Overall ADL's : Needs assistance/impaired                                       General ADL Comments: Pt at Supervision level for ADLs. Able to adjust sock in standing without LOB however educated on sitting for LB ADLs.      Vision Additional Comments: No change from baseline.          Pertinent Vitals/Pain Pain Assessment: 0-10 Pain Score: 5  Pain Location: LLE Pain Descriptors / Indicators: Aching Pain Intervention(s): Limited activity within patient's tolerance;Monitored during session;Repositioned     Hand Dominance     Extremity/Trunk Assessment Upper Extremity Assessment Upper Extremity Assessment: Overall WFL for  tasks assessed   Lower Extremity Assessment Lower Extremity Assessment: Defer to PT evaluation   Cervical / Trunk Assessment Cervical / Trunk Assessment: Normal   Communication Communication Communication: No difficulties   Cognition Arousal/Alertness: Awake/alert Behavior During Therapy: WFL for tasks assessed/performed Overall Cognitive Status: Within Functional Limits for tasks assessed                                Home Living Family/patient expects to be discharged to:: Private residence Living Arrangements: Alone Available Help at Discharge: Friend(s);Available PRN/intermittently Type of Home: House Home Access: Stairs to enter Entrance Stairs-Number of Steps: 1   Home Layout: Two level;Able to live on main level with bedroom/bathroom     Bathroom Shower/Tub: Occupational psychologist: Standard     Home Equipment: Environmental consultant - 2 wheels;Grab bars - tub/shower;Hand held shower head          Prior Functioning/Environment Level of Independence: Independent        Comments: pt states she has been sponge bathing and using RW periodically due to LLE pain    OT Diagnosis: Generalized weakness;Acute pain    End of Session Equipment Utilized During Treatment: Gait belt;Rolling walker  Activity Tolerance: Patient tolerated treatment well Patient left: in chair;with  call bell/phone within reach   Time: 1513-1536 OT Time Calculation (min): 23 min Charges:  OT General Charges $OT Visit: 1 Procedure OT Evaluation $Initial OT Evaluation Tier I: 1 Procedure OT Treatments $Therapeutic Activity: 8-22 mins G-Codes:    Juluis Rainier 05/08/2015, 4:08 PM   Cyndie Chime, OTR/L Occupational Therapist [phone] (pager)

## 2015-04-19 NOTE — Progress Notes (Addendum)
  [facility] Progress Note  04/19/2015 7:42 AM 1 Day Post-Op  Subjective:  Soreness with left groin. Numbness in left foot. Same as before surgery.   Tmax 98.2 BP sys 130s-160s 02 100% RA  Filed Vitals:   04/19/15 0408  BP:   Pulse:   Temp: 98 F (36.7 C)  Resp:     Physical Exam: Incisions:  Bilateral groin incisions healing well.  Extremities: Brisk doppler flow dorsalis pedis and posterior tibial bilaterally  CBC    Component Value Date/Time   WBC 6.2 04/19/2015 0248   WBC 7.3 04/16/2015 0851   RBC 3.71* 04/19/2015 0248   RBC 4.02 04/16/2015 0851   HGB 12.8 04/19/2015 0248   HGB 14.1 04/16/2015 0851   HCT 35.8* 04/19/2015 0248   HCT 39.4 04/16/2015 0851   PLT 146* 04/19/2015 0248   PLT 146 04/16/2015 0851   MCV 96.5 04/19/2015 0248   MCV 98.0 04/16/2015 0851   MCH 34.5* 04/19/2015 0248   MCH 35.1* 04/16/2015 0851   MCHC 35.8 04/19/2015 0248   MCHC 35.8 04/16/2015 0851   RDW 12.4 04/19/2015 0248   RDW 12.6 04/16/2015 0851   LYMPHSABS 2.8 04/16/2015 0851   LYMPHSABS 1.8 01/11/2015 1116   MONOABS 0.5 04/16/2015 0851   MONOABS 0.6 01/11/2015 1116   EOSABS 0.1 04/16/2015 0851   EOSABS 0.1 01/11/2015 1116   BASOSABS 0.0 04/16/2015 0851   BASOSABS 0.0 01/11/2015 1116    BMET    Component Value Date/Time   NA 138 04/19/2015 0248   NA 142 04/16/2015 0851   K 4.0 04/19/2015 0248   K 3.5 04/16/2015 0851   CL 102 04/19/2015 0248   CO2 28 04/19/2015 0248   CO2 24 04/16/2015 0851   GLUCOSE 122* 04/19/2015 0248   GLUCOSE 69* 04/16/2015 0851   BUN 9 04/19/2015 0248   BUN 10.7 04/16/2015 0851   CREATININE 0.74 04/19/2015 0248   CREATININE 0.7 04/16/2015 0851   CALCIUM 9.0 04/19/2015 0248   CALCIUM 9.2 04/16/2015 0851   GFRNONAA >60 04/19/2015 0248   GFRAA >60 04/19/2015 0248    INR    Component Value Date/Time   INR 1.07 04/18/2015 1005     Intake/Output Summary (Last 24 hours) at 04/19/15 0742 Last data filed at 04/19/15  0700  Gross per 24 hour  Intake   1870 ml  Output   2800 ml  Net   -930 ml     Assessment:  54 y.o. female is s/p: Right to left femoral-femoral bypass, right common and superficial femoral endarterectomy 1 Day Post-Op  Plan: -Doing well post-operatively. Explained that numbness in left foot will improve and due to poor circulation prior to surgery. -Excellent doppler flow bilaterally. -Mobilize.  -Transfer to 2W -Lovenox for DVT prophylaxis.   Virgina Jock, PA-C [facility] Office: [phone] Pager: [phone] 04/19/2015 7:42 AM     Agree with above.  Palpable graft pulse.  Doppler PT DP left 1+ DP right foot Transfer 2w Ambulate Home next 1-2 days Post op ABI  Ruta Hinds, MD [facility] [place] Office: [phone] Pager: [phone]

## 2015-04-19 NOTE — Progress Notes (Signed)
UR COMPLETED  

## 2015-04-19 NOTE — Progress Notes (Signed)
Chaplain requested Chaplain due to patient being transformed from his unit to new unit.  Explored faith and belief issues:  Helped reflect upon the connections between her faith system and her current circumstance.   Listen attentively and reflectively as patient shared her theological beliefs and the strength is has provided her and the family.  Catherine Rogers, pastor will be arriving later today.   04/19/15 1400  Clinical Encounter Type  Visited With Patient;Health care provider  Visit Type Spiritual support  Referral From [place] Needs Emotional

## 2015-04-19 NOTE — Care Management Note (Signed)
Case Management Note  Patient Details  Name: Catherine Rogers MRN: [id number] Date of Birth: 09-24-1961  Subjective/Objective:            Pt  with hx of colon ca  and  pad, admitted with L foot ischemia, s/p L fem-fem bypass 04/18/15.        Action/Plan: Return to home when medically stable. CM to follow up with d/c disposition.  Expected Discharge Date:                  Expected Discharge Plan:  [place]  In-House Referral:     Discharge planning Services  CM Consult  Post Acute Care Choice:    Choice offered to:     DME Arranged:    DME Agency:     HH Arranged:    [place] Agency:     Status of Service:     Medicare Important Message Given:  N/A - LOS <3 / Initial given by admissions Date Medicare IM Given:    Medicare IM give by:    Date Additional Medicare IM Given:    Additional Medicare Important Message give by:     If discussed at [place] of Stay Meetings, dates discussed:    Additional Comments: PT recommends HHPT @ d/c. CM spoke with pt regarding recommendations and pt has agreed to therapy @ d/c. Pt states has used Adult nurse in the past and would like to use them upon d/c for HHPT. CM needs order from MD for HHPT.  CM to f/u with d/c needs. [name], RN 04/19/2015, 10:21 AM

## 2015-04-19 NOTE — Evaluation (Signed)
Physical Therapy Evaluation Patient Details Name: Catherine Rogers MRN: [id number] DOB: 04/28/1961 Today's Date: 04/19/2015   History of Present Illness  Patient admitted with left leg ischemi s/p right to left fem to fem BPG. She has an extremely complex past history. She is very pleasant 54 year old female who was found to have a mass in her cecum in the October 2015. This appeared to be a colon cancer with ileocecal intussusception. She underwent resection of this electively was found to have colon cancer. Subsequently in December 2015 she developed a left pelvic mass. 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 She subsequently underwent laparoscopy and the removal of her right ovary by OB/GYN in February 2016. She reports that following her initial pelvic surgery in the October she began having discomfort in her left leg. This is progressed since this time  Clinical Impression  Pt pleasant and eager to return to independent and active lifestyle. Pt reports she has continued LLE pain at 7/10 at all times. Pt educated for HEP, mobility and encouraged increased gait and mobility throughout the day. Pt will benefit from acute therapy to maximize mobility, function and gait to return to independent lifestyle.    Follow Up Recommendations Home health PT    Equipment Recommendations  None recommended by PT    Recommendations for Other Services       Precautions / Restrictions Precautions Precautions: None      Mobility  Bed Mobility               General bed mobility comments: in chair on arrival  Transfers Overall transfer level: Needs assistance   Transfers: Sit to/from Stand Sit to Stand: Supervision         General transfer comment: cues for hand placement  Ambulation/Gait Ambulation/Gait assistance: Supervision Ambulation Distance (Feet): 150 Feet Assistive device: Rolling walker (2 wheeled) Gait Pattern/deviations:  Step-through pattern;Decreased stride length   Gait velocity interpretation: Below normal speed for age/gender General Gait Details: cues for sequence and looking up  Stairs            Wheelchair Mobility    Modified Rankin (Stroke Patients Only)       Balance Overall balance assessment: Needs assistance   Sitting balance-Leahy Scale: Good       Standing balance-Leahy Scale: Fair                               Pertinent Vitals/Pain Pain Assessment: 0-10 Pain Score: 7  Pain Location: LLE Pain Descriptors / Indicators: Aching Pain Intervention(s): Premedicated before session;Repositioned;Limited activity within patient's tolerance    Home Living Family/patient expects to be discharged to:: Private residence Living Arrangements: Alone Available Help at Discharge: Friend(s);Available PRN/intermittently Type of Home: House Home Access: Stairs to enter   Entrance Stairs-Number of Steps: 1 Home Layout: Two level;Able to live on main level with bedroom/bathroom Home Equipment: Walker - 2 wheels Additional Comments: pt states she has an order for cane but has not obtained it yet    Prior Function Level of Independence: Independent         Comments: pt states she has been sponge bathing and using RW periodically due to LLE pain     Hand Dominance        Extremity/Trunk Assessment   Upper Extremity Assessment: Overall WFL for tasks assessed           Lower  Extremity Assessment: Overall WFL for tasks assessed      Cervical / Trunk Assessment: Normal  Communication   Communication: No difficulties  Cognition Arousal/Alertness: Awake/alert Behavior During Therapy: WFL for tasks assessed/performed Overall Cognitive Status: Within Functional Limits for tasks assessed                      General Comments      Exercises General Exercises - Lower Extremity Long Arc Quad: AROM;Seated;Left;15 reps Hip Flexion/Marching:  AROM;Seated;Left;15 reps Toe Raises: AROM;Seated;Left;15 reps      Assessment/Plan    PT Assessment Patient needs continued PT services  PT Diagnosis Difficulty walking;Acute pain   PT Problem List Decreased activity tolerance;Decreased range of motion;Decreased balance;Decreased knowledge of use of DME;Decreased mobility  PT Treatment Interventions Gait training;Stair training;DME instruction;Functional mobility training;Therapeutic activities;Therapeutic exercise;Patient/family education   PT Goals (Current goals can be found in the Care Plan section) Acute Rehab PT Goals Patient Stated Goal: return to playing pool PT Goal Formulation: With patient Time For Goal Achievement: 05/03/15 Potential to Achieve Goals: Good    Frequency Min 3X/week   Barriers to discharge Decreased caregiver support      Co-evaluation               End of Session Equipment Utilized During Treatment: Gait belt Activity Tolerance: Patient tolerated treatment well Patient left: in chair;with call bell/phone within reach Nurse Communication: Mobility status         Time: 1505-6979 PT Time Calculation (min) (ACUTE ONLY): 25 min   Charges:   PT Evaluation $Initial PT Evaluation Tier I: 1 Procedure PT Treatments $Therapeutic Activity: 8-22 mins   PT G CodesMelford Aase 04/19/2015, 9:34 AM Elwyn Reach, [place]

## 2015-04-20 ENCOUNTER — Other Ambulatory Visit: Payer: Self-pay | Admitting: Hematology

## 2015-04-20 ENCOUNTER — Ambulatory Visit: Payer: Medicare Other | Admitting: Hematology

## 2015-04-20 MED ORDER — HYDROCODONE-ACETAMINOPHEN 7.5-325 MG PO TABS: 1.0000 | ORAL_TABLET | Freq: Four times a day (QID) | ORAL | Status: DC | PRN

## 2015-04-20 NOTE — Progress Notes (Signed)
Pt d/c home.  Alert and oriented x4.  No c/o pain.  Bilateral groin incisions dry/intact.  IV D/Cd. Tele D/Cd.  Pt given education on diet, activity, meds, wound care, and follow-up care and appointments. Pt given prescription and related information.  Pt verbalized understanding.  Pt was taken home by husband.

## 2015-04-20 NOTE — Anesthesia Postprocedure Evaluation (Signed)
  Anesthesia Post-op Note  Patient: Catherine Rogers  Procedure(s) Performed: Procedure(s):  RIGHT COMMON FEMORAL TO LEFT COMMON  FEMORAL  ARTERY BYPASS WITH  8 MM HEMASHIELD GRAFT,RIGHT COMMON SUPERFICIAL FEMORAL ARTERY ENDARTERECTOMY (Bilateral)  Patient Location: PACU  Anesthesia Type:General  Level of Consciousness: awake and alert   Airway and Oxygen Therapy: Patient Spontanous Breathing  Post-op Pain: mild  Post-op Assessment: Post-op Vital signs reviewed  Post-op Vital Signs: stable  Last Vitals:  Filed Vitals:   04/20/15 0437  BP: 100/66  Pulse: 61  Temp: 36.8 C  Resp: 18    Complications: No apparent anesthesia complications

## 2015-04-20 NOTE — Progress Notes (Addendum)
  Progress Note    04/20/2015 8:48 AM 2 Days Post-Op  Subjective:  Feeling better; feet much better.  Did a lot of walking yesterday  Afebrile HR 48'G-89'V 694'H systolic 03% RA  Filed Vitals:   04/20/15 0437  BP: 100/66  Pulse: 61  Temp: 98.3 F (36.8 C)  Resp: 18    Physical Exam: Lungs:  Non labored Incisions:  Bilateral groins are soft and incisions are healing nicely Extremities:  Bilateral feet are warm   CBC    Component Value Date/Time   WBC 6.2 04/19/2015 0248   WBC 7.3 04/16/2015 0851   RBC 3.71* 04/19/2015 0248   RBC 4.02 04/16/2015 0851   HGB 12.8 04/19/2015 0248   HGB 14.1 04/16/2015 0851   HCT 35.8* 04/19/2015 0248   HCT 39.4 04/16/2015 0851   PLT 146* 04/19/2015 0248   PLT 146 04/16/2015 0851   MCV 96.5 04/19/2015 0248   MCV 98.0 04/16/2015 0851   MCH 34.5* 04/19/2015 0248   MCH 35.1* 04/16/2015 0851   MCHC 35.8 04/19/2015 0248   MCHC 35.8 04/16/2015 0851   RDW 12.4 04/19/2015 0248   RDW 12.6 04/16/2015 0851   LYMPHSABS 2.8 04/16/2015 0851   LYMPHSABS 1.8 01/11/2015 1116   MONOABS 0.5 04/16/2015 0851   MONOABS 0.6 01/11/2015 1116   EOSABS 0.1 04/16/2015 0851   EOSABS 0.1 01/11/2015 1116   BASOSABS 0.0 04/16/2015 0851   BASOSABS 0.0 01/11/2015 1116    BMET    Component Value Date/Time   NA 138 04/19/2015 0248   NA 142 04/16/2015 0851   K 4.0 04/19/2015 0248   K 3.5 04/16/2015 0851   CL 102 04/19/2015 0248   CO2 28 04/19/2015 0248   CO2 24 04/16/2015 0851   GLUCOSE 122* 04/19/2015 0248   GLUCOSE 69* 04/16/2015 0851   BUN 9 04/19/2015 0248   BUN 10.7 04/16/2015 0851   CREATININE 0.74 04/19/2015 0248   CREATININE 0.7 04/16/2015 0851   CALCIUM 9.0 04/19/2015 0248   CALCIUM 9.2 04/16/2015 0851   GFRNONAA >60 04/19/2015 0248   GFRAA >60 04/19/2015 0248    INR    Component Value Date/Time   INR 1.07 04/18/2015 1005     Intake/Output Summary (Last 24 hours) at 04/20/15 0848 Last data filed at 04/19/15 2015  Gross per 24  hour  Intake    840 ml  Output    300 ml  Net    540 ml     Assessment:  54 y.o. female is s/p:  Right to left femoral-femoral bypass, right common and superficial femoral endarterectomy  2 Days Post-Op  Plan: -pt doing well this am and wants to go home -discharge today and f/u with Dr. Oneida Alar in 2 weeks. -discussed groin wound care with pt and importance of keeping wounds dry.  She expressed understanding. -will add a low dose statin   Leontine Locket, PA-C [facility] [phone] 04/20/2015 8:48 AM    Agree with above 2+ fem fem pulse Left foot warm with some edema  Ok for d/c  Ruta Hinds, MD [facility] [place]: [phone] Pager: [phone]

## 2015-04-20 NOTE — Discharge Summary (Signed)
Discharge Summary     Catherine Rogers 1961-07-10 54 y.o. female  [id number]  Admission Date: 04/18/2015  Discharge Date: 5/27/6  Physician: Elam Dutch, MD  Admission Diagnosis: Left Foot Ischemia    HPI:   This is a 54 y.o. female Patient resents today for evaluation of left leg ischemia. She has an extremely complex past history. She is very pleasant 54 year old female who was found to have a mass in her cecum in the October 2015. This appeared to be a colon cancer with ileocecal intussusception. She underwent resection of this electively was found to have colon cancer. Subsequently in December 2015 she developed a left pelvic mass. 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 She subsequently underwent laparoscopy and the removal of her right ovary by OB/GYN in February 2016. She reports that following her initial pelvic surgery in the October she began having discomfort in her left leg. This is progressed since this time. She has had multiple duplexes to rule out DVT and these of all been negative. She is seeing neurology with an extensive neurologic workup as well. She presented to the [facility] long emergency department yesterday for evaluation of worsening pain. 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 Interestingly in reviewing her initial CT scan in October 2015, she did have occlusion of the origin of her left common iliac at that time. She has had extension of the iliac occlusion. She reports severe classic rest pain. She is awakened multiple times during the night and has to hang her leg in a dependent fashion for relief. She is difficult time walking related to this.  Hospital Course:  The patient was admitted to the hospital and taken to the operating room on 04/18/2015 and underwent: Right to left femoral-femoral  bypass, right common and superficial femoral endarterectomy    The pt tolerated the procedure well and was transported to the PACU in good condition.   By POD 1, she was doing well with excellent doppler flow bilaterally.  She was transferred to the telemetry floor on POD 1.   By POD 2, she had significantly increased her mobilization and will be discharged home.  Postoperative ABI's on 04/19/15 are as follows:  RIGHT    LEFT    PRESSURE WAVEFORM  PRESSURE WAVEFORM  BRACHIAL 130 Bi BRACHIAL 139 Bi  DP   DP    AT 83 Mono AT  Not audible, can only hear venous  PT 81 Mono PT 71 Mono  PER   PER 66 Mono   GREAT TOE  NA GREAT TOE  NA    RIGHT LEFT  ABI 0.6 0.51        She has allergy to Ibuprofen and Tylenol.  I discussed with the pt and she is no longer taking the Ibuprofen.  She states that she tolerates the Tylenol in the Lortab.  She has Fentanyl patches at home.  She states she has 5 left.  She wears these for 3 days then alternates with Lortab.  She does not take these at the same time.  I am also taking Lovenox off her current medication list as she states she has not taken this in quite some time.  The remainder of the hospital course consisted of increasing mobilization and increasing intake of solids without difficulty.  CBC  Component Value Date/Time   WBC 6.2 04/19/2015 0248   WBC 7.3 04/16/2015 0851   RBC 3.71* 04/19/2015 0248   RBC 4.02 04/16/2015 0851   HGB 12.8 04/19/2015 0248   HGB 14.1 04/16/2015 0851   HCT 35.8* 04/19/2015 0248   HCT 39.4 04/16/2015 0851   PLT 146* 04/19/2015 0248   PLT 146 04/16/2015 0851   MCV 96.5 04/19/2015 0248   MCV 98.0 04/16/2015 0851   MCH 34.5* 04/19/2015 0248   MCH 35.1* 04/16/2015 0851   MCHC 35.8 04/19/2015 0248   MCHC 35.8 04/16/2015 0851   RDW 12.4 04/19/2015 0248   RDW 12.6 04/16/2015 0851   LYMPHSABS 2.8 04/16/2015 0851   LYMPHSABS 1.8 01/11/2015 1116   MONOABS  0.5 04/16/2015 0851   MONOABS 0.6 01/11/2015 1116   EOSABS 0.1 04/16/2015 0851   EOSABS 0.1 01/11/2015 1116   BASOSABS 0.0 04/16/2015 0851   BASOSABS 0.0 01/11/2015 1116    BMET    Component Value Date/Time   NA 138 04/19/2015 0248   NA 142 04/16/2015 0851   K 4.0 04/19/2015 0248   K 3.5 04/16/2015 0851   CL 102 04/19/2015 0248   CO2 28 04/19/2015 0248   CO2 24 04/16/2015 0851   GLUCOSE 122* 04/19/2015 0248   GLUCOSE 69* 04/16/2015 0851   BUN 9 04/19/2015 0248   BUN 10.7 04/16/2015 0851   CREATININE 0.74 04/19/2015 0248   CREATININE 0.7 04/16/2015 0851   CALCIUM 9.0 04/19/2015 0248   CALCIUM 9.2 04/16/2015 0851   GFRNONAA >60 04/19/2015 0248   GFRAA >60 04/19/2015 0248           Discharge Instructions    Call MD for:  redness, tenderness, or signs of infection (pain, swelling, bleeding, redness, odor or green/yellow discharge around incision site)    Complete by:  As directed      Call MD for:  severe or increased pain, loss or decreased feeling  in affected limb(s)    Complete by:  As directed      Call MD for:  temperature >100.5    Complete by:  As directed      Discharge wound care:    Complete by:  As directed   Wash the groin wound with soap and water daily and pat dry. (No tub bath-only shower)  Then put a dry gauze or washcloth there to keep this area dry daily and as needed.  Do not use Vaseline or neosporin on your incisions.  Only use soap and water on your incisions and then protect and keep dry.     Driving Restrictions    Complete by:  As directed   No driving for 2 weeks     Lifting restrictions    Complete by:  As directed   No lifting for 4 weeks     Resume previous diet    Complete by:  As directed            Discharge Diagnosis:  Left Foot Ischemia   Secondary Diagnosis: Patient Active Problem List   Diagnosis Date Noted  . PAD (peripheral artery disease) 04/18/2015  . Genetic testing 04/06/2015  . Family history of ovarian cancer     . Lumbosacral plexopathy 01/29/2015  . Leg pain, left 01/29/2015  . Paresthesia 01/29/2015  . Left leg numbness 01/05/2015  . Postoperative anemia due to acute blood loss 12/28/2014  . Ovarian cancer 12/26/2014  . Hypertension 12/22/2014  . Anxiety 12/22/2014  . Cancer associated pain 12/22/2014  .  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 11/28/2014  . Adenocarcinoma of colon 10/11/2014  . Intussusception, ileocecal 09/18/2014  . Mass of pelvis 09/18/2014  . GI bleed 09/16/2014  . Alcohol use 09/16/2014   Past Medical History  Diagnosis Date  . Hypertension   . Ectopic pregnancy   . Head injury, closed, with brief LOC     had a cut- as a child  . Asthma   . Anxiety   . Depression   . PTSD (post-traumatic stress disorder)   . Arthritis   . Pelvic cyst   . Complication of anesthesia     woke and saw long tube coming out of mouth . sat up then went back to sleep  . PONV (postoperative nausea and vomiting)   . Headache   . Cancer     colon and ovarian  . Anemia   . History of blood transfusion     2005 approximately   . Family history of ovarian cancer   . Family history of adverse reaction to anesthesia     sister on dialysis- hypotension   . GERD (gastroesophageal reflux disease)     occasional  . PAD (peripheral artery disease) 04/18/2015       Medication List    STOP taking these medications        enoxaparin 40 MG/0.4ML injection  Commonly known as:  LOVENOX     HYDROcodone-acetaminophen 7.5-500 MG per tablet  Commonly known as:  LORTAB  Replaced by:  HYDROcodone-acetaminophen 7.5-325 MG per tablet     ibuprofen 800 MG tablet  Commonly known as:  ADVIL,MOTRIN     metroNIDAZOLE 500 MG tablet  Commonly known as:  FLAGYL      TAKE these medications        doxycycline 100 MG capsule  Commonly known as:  VIBRAMYCIN  Take 1 capsule (100 mg total) by mouth 2 (two) times daily.     DSS 100 MG Caps  Take 100 mg by mouth 2 (two) times daily as needed for mild  constipation.     fentaNYL 50 MCG/HR  Commonly known as:  DURAGESIC  Place 1 patch (50 mcg total) onto the skin every 3 (three) days.     ferrous sulfate 325 (65 FE) MG tablet  Take 1 tablet (325 mg total) by mouth 2 (two) times daily with a meal.     gabapentin 100 MG capsule  Commonly known as:  NEURONTIN  Take 1 capsule (100 mg total) by mouth 2 (two) times daily.     hydrochlorothiazide 25 MG tablet  Commonly known as:  HYDRODIURIL  Take 25 mg by mouth every morning.     HYDROcodone-acetaminophen 7.5-325 MG per tablet  Commonly known as:  LORTAB  Take 1 tablet by mouth every 6 (six) hours as needed for moderate pain.     ondansetron 8 MG tablet  Commonly known as:  ZOFRAN  Take 1 tablet (8 mg total) by mouth every 8 (eight) hours as needed for nausea or vomiting.     pregabalin 100 MG capsule  Commonly known as:  LYRICA  2 tabs tid        Prescriptions given: 1.  Lortab 7.5/325 #30 No Refill  Instructions: 1.  Wash the groin wound with soap and water daily and pat dry. (No tub bath-only shower)  Then put a dry gauze or washcloth there to keep this area dry daily and as needed.  Do not use Vaseline or neosporin on your incisions.  Only  use soap and water on your incisions and then protect and keep dry. 2.  Do not take Lortab and Fentanyl patches at the same time. 3.  No driving x 2 weeks 4.  No driving x 4 weeks  Disposition: home  Patient's condition: is Good  Follow up: 1. Dr. Oneida Alar in 2 weeks   Leontine Locket, PA-C [facility] [phone] 04/20/2015  9:23 AM  - For VQI Registry use --- Instructions: Press F2 to tab through selections.  Delete question if not applicable.   Post-op:  Wound infection: No  Graft infection: No  Transfusion: No  If yes, n/a units given New Arrhythmia: No Ipsilateral amputation: No, '[ ]'$  Minor, '[ ]'$  BKA, '[ ]'$  AKA Discharge patency: [x ] Primary, '[ ]'$  Primary assisted, '[ ]'$  Secondary, '[ ]'$  Occluded Patency  judged by: '[ ]'$  Dopper only, '[ ]'$  Palpable graft pulse, '[ ]'$  Palpable distal pulse, '[ ]'$  ABI inc. > 0.15, '[ ]'$  Duplex Discharge ABI: R 0.6, L 0.51 Discharge TBI: R , L  D/C Ambulatory Status: Ambulatory  Complications: MI: No, '[ ]'$  Troponin only, '[ ]'$  EKG or Clinical CHF: No Resp failure:No, '[ ]'$  Pneumonia, '[ ]'$  Ventilator Chg in renal function: No, '[ ]'$  Inc. Cr > 0.5, '[ ]'$  Temp. Dialysis, '[ ]'$  Permanent dialysis Stroke: No, '[ ]'$  Minor, '[ ]'$  Major Return to OR: No  Reason for return to OR: '[ ]'$  Bleeding, '[ ]'$  Infection, '[ ]'$  Thrombosis, '[ ]'$  Revision  Discharge medications: Statin use:  no ASA use:  No-not started due to GIB in November 2015. Plavix use:  no Beta blocker use: no Coumadin use: no

## 2015-04-20 NOTE — Care Management Note (Signed)
Case Management Note Addendum to original note started by [place]   Patient Details  Name: Catherine Rogers MRN: [id number] Date of Birth: 06-07-1961  Subjective/Objective:            Pt  with hx of colon ca  and  pad, admitted with L foot ischemia, s/p L fem-fem bypass 04/18/15.        Action/Plan: Return to home when medically stable. CM to follow up with d/c disposition.  Expected Discharge Date:                  Expected Discharge Plan:  [facility]  In-House Referral:     Discharge planning Services  CM Consult  Post Acute Care Choice:    Choice offered to:  Patient  DME Arranged:    DME Agency:     HH Arranged:  PT (DONNA, REFERRAL MADE WITH [phone]) [place] Agency:  [facility]  Status of Service:  Completed, signed off  Medicare Important Message Given:  N/A - LOS <3 / Initial given by admissions Date Medicare IM Given:    Medicare IM give by:    Date Additional Medicare IM Given:    Additional Medicare Important Message give by:     If discussed at [place] of Stay Meetings, dates discussed:    Additional Comments: PT recommends HHPT @ d/c. CM spoke with pt regarding recommendations and pt has agreed to therapy @ d/c. Pt states has used Adult nurse in the past and would like to use them upon d/c for HHPT. CM needs order from MD for HHPT.  CM to f/u with d/c needs.  Marvetta Gibbons [facility] 04/20/15- 1000- spoke with Butch Penny with [facility]- HH-PT order placed- pt for d/c later today, [facility] to f/u for [facility] services within 24-48 hr post discharge.  Marvetta Gibbons [place], RN - [phone] 04/20/2015, 10:08 AM

## 2015-04-21 DIAGNOSIS — I739 Peripheral vascular disease, unspecified: Secondary | ICD-10-CM | POA: Diagnosis not present

## 2015-04-21 DIAGNOSIS — I70202 Unspecified atherosclerosis of native arteries of extremities, left leg: Secondary | ICD-10-CM | POA: Diagnosis not present

## 2015-04-21 DIAGNOSIS — I10 Essential (primary) hypertension: Secondary | ICD-10-CM | POA: Diagnosis not present

## 2015-04-21 DIAGNOSIS — F329 Major depressive disorder, single episode, unspecified: Secondary | ICD-10-CM | POA: Diagnosis not present

## 2015-04-21 DIAGNOSIS — Z48812 Encounter for surgical aftercare following surgery on the circulatory system: Secondary | ICD-10-CM | POA: Diagnosis not present

## 2015-04-21 DIAGNOSIS — J45909 Unspecified asthma, uncomplicated: Secondary | ICD-10-CM | POA: Diagnosis not present

## 2015-04-21 DIAGNOSIS — M150 Primary generalized (osteo)arthritis: Secondary | ICD-10-CM | POA: Diagnosis not present

## 2015-04-21 DIAGNOSIS — F419 Anxiety disorder, unspecified: Secondary | ICD-10-CM | POA: Diagnosis not present

## 2015-04-23 DIAGNOSIS — Z48812 Encounter for surgical aftercare following surgery on the circulatory system: Secondary | ICD-10-CM | POA: Diagnosis not present

## 2015-04-23 DIAGNOSIS — I70202 Unspecified atherosclerosis of native arteries of extremities, left leg: Secondary | ICD-10-CM | POA: Diagnosis not present

## 2015-04-23 DIAGNOSIS — M150 Primary generalized (osteo)arthritis: Secondary | ICD-10-CM | POA: Diagnosis not present

## 2015-04-23 DIAGNOSIS — I739 Peripheral vascular disease, unspecified: Secondary | ICD-10-CM | POA: Diagnosis not present

## 2015-04-23 DIAGNOSIS — I10 Essential (primary) hypertension: Secondary | ICD-10-CM | POA: Diagnosis not present

## 2015-04-23 DIAGNOSIS — J45909 Unspecified asthma, uncomplicated: Secondary | ICD-10-CM | POA: Diagnosis not present

## 2015-04-24 ENCOUNTER — Telehealth: Payer: Self-pay | Admitting: Hematology

## 2015-04-24 NOTE — Telephone Encounter (Signed)
Lft msg for pt confirming MD visit per 05/31 POF, mailed schedule to pt... KJ

## 2015-04-25 ENCOUNTER — Telehealth: Payer: Self-pay | Admitting: Genetic Counselor

## 2015-04-25 ENCOUNTER — Telehealth: Payer: Self-pay | Admitting: Vascular Surgery

## 2015-04-25 ENCOUNTER — Encounter: Payer: Medicare Other | Admitting: Genetic Counselor

## 2015-04-25 DIAGNOSIS — M150 Primary generalized (osteo)arthritis: Secondary | ICD-10-CM | POA: Diagnosis not present

## 2015-04-25 DIAGNOSIS — I739 Peripheral vascular disease, unspecified: Secondary | ICD-10-CM | POA: Diagnosis not present

## 2015-04-25 DIAGNOSIS — I10 Essential (primary) hypertension: Secondary | ICD-10-CM | POA: Diagnosis not present

## 2015-04-25 DIAGNOSIS — J45909 Unspecified asthma, uncomplicated: Secondary | ICD-10-CM | POA: Diagnosis not present

## 2015-04-25 DIAGNOSIS — I70202 Unspecified atherosclerosis of native arteries of extremities, left leg: Secondary | ICD-10-CM | POA: Diagnosis not present

## 2015-04-25 DIAGNOSIS — Z48812 Encounter for surgical aftercare following surgery on the circulatory system: Secondary | ICD-10-CM | POA: Diagnosis not present

## 2015-04-25 NOTE — Telephone Encounter (Signed)
Patient called and LM on my VM stating that she had just got out of the hospital and needed to r/s her appointment for tomorrow.  Will call the patient back and r/s.

## 2015-04-25 NOTE — Telephone Encounter (Signed)
LM on VM to r/s Ms. Bialecki appointment

## 2015-04-25 NOTE — Telephone Encounter (Addendum)
-----   Message from Mena Goes, RN sent at 04/20/2015  9:52 AM EDT ----- Regarding: Schedule   ----- Message -----    From: Gabriel Earing, PA-C    Sent: 04/20/2015   9:22 AM      To: [facility] Charge Pool  S/p Right to left femoral-femoral bypass, right common and superficial femoral endarterectomy 04/18/15.  F/u with Dr. Oneida Alar in 2 weeks.  Thanks  04/25/15: lm for pt re appt, dpm

## 2015-04-26 ENCOUNTER — Telehealth: Payer: Self-pay | Admitting: *Deleted

## 2015-04-26 NOTE — Telephone Encounter (Signed)
"  I need to speak with a patient advocate.  Something went wrong.  I had surgery December 26, 2014.  I walked in the hospital and did not walk out.  I need to know what went wrong from someone on my behalf.  While on the table my left leg started hurting.  After surgery it hurt so bad I wanted them to cut it off.  I've seen neurologist, Dr. Ronny Flurry who's given me injections saying I have nerve damage.  I  Had a bad limp and could only drag my left leg so I went to the ER and test showed I have 13 % circulation in my left leg compared to 100 % in the right.  I had emergency surgery to left leg because I could have lost my leg Wednesday and was discharged Friday.  Walking a little better since surgery.  Why did this happen?  I want Dr. Hulen Skains to know what happened."  This nurse will notify Dr. Burr Medico and Dr. Denman George.  Suggested she contact Dr. Denman George.  She asked "who she can call to report her experience with surgery at [facility]".  [facility] Patient Experience team or Risk Management named.  Return number for patient is [phone].

## 2015-04-27 ENCOUNTER — Telehealth: Payer: Self-pay | Admitting: Gynecologic Oncology

## 2015-04-27 DIAGNOSIS — I10 Essential (primary) hypertension: Secondary | ICD-10-CM | POA: Diagnosis not present

## 2015-04-27 DIAGNOSIS — J45909 Unspecified asthma, uncomplicated: Secondary | ICD-10-CM | POA: Diagnosis not present

## 2015-04-27 DIAGNOSIS — Z48812 Encounter for surgical aftercare following surgery on the circulatory system: Secondary | ICD-10-CM | POA: Diagnosis not present

## 2015-04-27 DIAGNOSIS — M150 Primary generalized (osteo)arthritis: Secondary | ICD-10-CM | POA: Diagnosis not present

## 2015-04-27 DIAGNOSIS — I70202 Unspecified atherosclerosis of native arteries of extremities, left leg: Secondary | ICD-10-CM | POA: Diagnosis not present

## 2015-04-27 DIAGNOSIS — I739 Peripheral vascular disease, unspecified: Secondary | ICD-10-CM | POA: Diagnosis not present

## 2015-04-27 NOTE — Telephone Encounter (Signed)
After being made aware of Catherine Rogers is recent surgery and concerns regarding the temporal nature of progression of her leg symptoms following her surgery with me in February I called the patient to discuss her concerns. Unfortunately twice today for not being able to reach the patient. A left telephone messages with our contact information at the office for her to reach out to me if she has concerns. I will reattempt contacting her over the course of this coming week.

## 2015-04-30 ENCOUNTER — Encounter: Payer: Self-pay | Admitting: Vascular Surgery

## 2015-05-01 ENCOUNTER — Encounter: Payer: Self-pay | Admitting: Vascular Surgery

## 2015-05-01 DIAGNOSIS — I70202 Unspecified atherosclerosis of native arteries of extremities, left leg: Secondary | ICD-10-CM | POA: Diagnosis not present

## 2015-05-01 DIAGNOSIS — M150 Primary generalized (osteo)arthritis: Secondary | ICD-10-CM | POA: Diagnosis not present

## 2015-05-01 DIAGNOSIS — I10 Essential (primary) hypertension: Secondary | ICD-10-CM | POA: Diagnosis not present

## 2015-05-01 DIAGNOSIS — I739 Peripheral vascular disease, unspecified: Secondary | ICD-10-CM | POA: Diagnosis not present

## 2015-05-01 DIAGNOSIS — J45909 Unspecified asthma, uncomplicated: Secondary | ICD-10-CM | POA: Diagnosis not present

## 2015-05-01 DIAGNOSIS — Z48812 Encounter for surgical aftercare following surgery on the circulatory system: Secondary | ICD-10-CM | POA: Diagnosis not present

## 2015-05-02 ENCOUNTER — Encounter: Payer: Self-pay | Admitting: Vascular Surgery

## 2015-05-02 ENCOUNTER — Ambulatory Visit (INDEPENDENT_AMBULATORY_CARE_PROVIDER_SITE_OTHER): Payer: Self-pay | Admitting: Vascular Surgery

## 2015-05-02 ENCOUNTER — Telehealth: Payer: Self-pay | Admitting: Vascular Surgery

## 2015-05-02 VITALS — BP 122/74 | HR 77 | Ht 67.0 in | Wt 122.6 lb

## 2015-05-02 DIAGNOSIS — M79672 Pain in left foot: Secondary | ICD-10-CM

## 2015-05-02 NOTE — Telephone Encounter (Signed)
Spoke with pt. X-ray to be done at [facility] [phone]. Order faxed to [phone]. Verified receipt. Pt verbalized understanding and said she would go tomorrow morning (05/03/15).

## 2015-05-02 NOTE — Progress Notes (Signed)
Patient is a 54 year old female returns for postoperative follow-up today. She recently underwent right-to-left femoral-femoral bypass with right common and superficial femoral endarterectomy. This was done for primarily left lower extremity claudication and rest pain in the left foot. This was done on 04/18/2015. The patient states she still has some pain in left foot. However she states she is walking much better and the pain is slightly improved.  Physical exam:  Filed Vitals:   05/02/15 0826 05/02/15 0829  BP: 141/91 122/74  Pulse: 77   Height: '5\' 7"'$  (1.702 m)   Weight: 55.611 kg (122 lb 9.6 oz)   SpO2: 100%     Extremities: Left and right groin incisions healing well good Doppler flow through the bypass graft. She has monophasic Doppler flow in the dorsalis pedis posterior tibial areas of the right foot and by the triphasic flow in the left foot. She does have some edema and erythema in the left foot.  Assessment: Doing well with patent bypass graft status post femoral-femoral bypass. Still with pain and erythema in the left foot. Some of this may be reperfusion symptoms. However, we will at least obtain an x-ray of her left foot to make sure there is nothing else to explain her pain.  She will follow-up in 3 weeks' time.  Ruta Hinds, MD [facility] [place] Office: [phone] Pager: [phone]

## 2015-05-03 DIAGNOSIS — R937 Abnormal findings on diagnostic imaging of other parts of musculoskeletal system: Secondary | ICD-10-CM | POA: Diagnosis not present

## 2015-05-03 DIAGNOSIS — M79672 Pain in left foot: Secondary | ICD-10-CM | POA: Diagnosis not present

## 2015-05-03 DIAGNOSIS — Z9889 Other specified postprocedural states: Secondary | ICD-10-CM | POA: Diagnosis not present

## 2015-05-04 DIAGNOSIS — I10 Essential (primary) hypertension: Secondary | ICD-10-CM | POA: Diagnosis not present

## 2015-05-04 DIAGNOSIS — Z48812 Encounter for surgical aftercare following surgery on the circulatory system: Secondary | ICD-10-CM | POA: Diagnosis not present

## 2015-05-04 DIAGNOSIS — I70202 Unspecified atherosclerosis of native arteries of extremities, left leg: Secondary | ICD-10-CM | POA: Diagnosis not present

## 2015-05-04 DIAGNOSIS — I739 Peripheral vascular disease, unspecified: Secondary | ICD-10-CM | POA: Diagnosis not present

## 2015-05-04 DIAGNOSIS — M150 Primary generalized (osteo)arthritis: Secondary | ICD-10-CM | POA: Diagnosis not present

## 2015-05-04 DIAGNOSIS — J45909 Unspecified asthma, uncomplicated: Secondary | ICD-10-CM | POA: Diagnosis not present

## 2015-05-11 ENCOUNTER — Telehealth: Payer: Self-pay | Admitting: Hematology

## 2015-05-11 ENCOUNTER — Ambulatory Visit (HOSPITAL_BASED_OUTPATIENT_CLINIC_OR_DEPARTMENT_OTHER): Payer: Medicare Other | Admitting: Hematology

## 2015-05-11 ENCOUNTER — Ambulatory Visit (HOSPITAL_BASED_OUTPATIENT_CLINIC_OR_DEPARTMENT_OTHER): Payer: Medicare Other

## 2015-05-11 VITALS — BP 118/81 | HR 80 | Temp 98.6°F | Resp 19 | Ht 67.0 in | Wt 125.6 lb

## 2015-05-11 DIAGNOSIS — G629 Polyneuropathy, unspecified: Secondary | ICD-10-CM | POA: Diagnosis not present

## 2015-05-11 DIAGNOSIS — C182 Malignant neoplasm of ascending colon: Secondary | ICD-10-CM

## 2015-05-11 DIAGNOSIS — C189 Malignant neoplasm of colon, unspecified: Secondary | ICD-10-CM | POA: Diagnosis not present

## 2015-05-11 DIAGNOSIS — Z1509 Genetic susceptibility to other malignant neoplasm: Secondary | ICD-10-CM

## 2015-05-11 DIAGNOSIS — C562 Malignant neoplasm of left ovary: Secondary | ICD-10-CM

## 2015-05-11 DIAGNOSIS — Z1501 Genetic susceptibility to malignant neoplasm of breast: Secondary | ICD-10-CM | POA: Diagnosis not present

## 2015-05-11 DIAGNOSIS — C569 Malignant neoplasm of unspecified ovary: Secondary | ICD-10-CM | POA: Diagnosis not present

## 2015-05-11 LAB — COMPREHENSIVE METABOLIC PANEL (CC13)
ALK PHOS: 73 U/L (ref 40–150)
ALT: 26 U/L (ref 0–55)
AST: 36 U/L — AB (ref 5–34)
Albumin: 4.1 g/dL (ref 3.5–5.0)
Anion Gap: 9 mEq/L (ref 3–11)
BUN: 19.7 mg/dL (ref 7.0–26.0)
CHLORIDE: 104 meq/L (ref 98–109)
CO2: 27 mEq/L (ref 22–29)
CREATININE: 0.8 mg/dL (ref 0.6–1.1)
Calcium: 10 mg/dL (ref 8.4–10.4)
Glucose: 84 mg/dl (ref 70–140)
POTASSIUM: 4.5 meq/L (ref 3.5–5.1)
Sodium: 140 mEq/L (ref 136–145)
Total Bilirubin: 0.68 mg/dL (ref 0.20–1.20)
Total Protein: 7.7 g/dL (ref 6.4–8.3)

## 2015-05-11 LAB — CBC WITH DIFFERENTIAL/PLATELET
BASO%: 0.4 % (ref 0.0–2.0)
Basophils Absolute: 0 10*3/uL (ref 0.0–0.1)
EOS ABS: 0 10*3/uL (ref 0.0–0.5)
EOS%: 0.8 % (ref 0.0–7.0)
HEMATOCRIT: 37.2 % (ref 34.8–46.6)
HGB: 13.1 g/dL (ref 11.6–15.9)
LYMPH%: 44 % (ref 14.0–49.7)
MCH: 35.1 pg — ABNORMAL HIGH (ref 25.1–34.0)
MCHC: 35.2 g/dL (ref 31.5–36.0)
MCV: 99.7 fL (ref 79.5–101.0)
MONO#: 0.4 10*3/uL (ref 0.1–0.9)
MONO%: 7.9 % (ref 0.0–14.0)
NEUT%: 46.9 % (ref 38.4–76.8)
NEUTROS ABS: 2.5 10*3/uL (ref 1.5–6.5)
NRBC: 0 % (ref 0–0)
PLATELETS: 180 10*3/uL (ref 145–400)
RBC: 3.73 10*6/uL (ref 3.70–5.45)
RDW: 13.4 % (ref 11.2–14.5)
WBC: 5.3 10*3/uL (ref 3.9–10.3)
lymph#: 2.3 10*3/uL (ref 0.9–3.3)

## 2015-05-11 MED ORDER — HYDROCODONE-ACETAMINOPHEN 7.5-325 MG PO TABS
2.0000 | ORAL_TABLET | Freq: Four times a day (QID) | ORAL | Status: DC | PRN
Start: 2015-05-11 — End: 2015-06-01

## 2015-05-11 NOTE — Progress Notes (Signed)
[place]  [phone] Fax:([phone] [place] Note   Patient Care Team: Golden Circle, FNP as PCP - General (Family Medicine) Truitt Merle, MD as Consulting Physician (Hematology) Judeth Horn, MD as Consulting Physician (General Surgery) Nat Math, MD as Referring Physician (Obstetrics and Gynecology) Everitt Amber, MD as Consulting Physician (Obstetrics and Gynecology) 05/12/2015   CHIEF COMPLAINTS/PURPOSE OF CONSULTATION:  1. Colon cancer, pT3N1M0, stage III 2. Ovarian cancer, pT1cNxM0, stage I  3. BRCA1 mutation (+)    Adenocarcinoma of colon   09/16/2014 Imaging CT abd/pel:  a 3.8cm cecal mass with associated ileocecal intussusception, and a 9.1cm solid and cystic mass in the left pelvis,   09/18/2014 Initial Diagnosis Adenocarcinoma of right colon   09/18/2014 Pathologic Stage pT3pN1Mx, tumor extends into pericolonic soft tissue and is less than 68m from the serosal surface, LVI 8-), PNI (-), all 23 node negative, one soft tissue tumor deposit, surgical margins negative.    09/18/2014 Surgery right hemicolectomy and terminal ileoectomy    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   10/09/2014 Imaging CT of the abdomen and pelvis showed a 9.1 cm solid and cystic mass in the left pelvis concerning for malignancy. This was discovered during her workup for colon cancer.   11/01/2014 Initial Diagnosis 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   11/01/2014 Surgery Salpingo-oophorectomy.   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 Tumor size 3.7 cm, no lymph nodes removed. T1cNx. Tumor cells are positive for cytokeratin 7 and estrogen receptor.     HISTORY OF PRESENTING ILLNESS:  [name].[id number]. female is here because of recently diagnosed colon cancer.   She presented with abdominal pain, nausea and vomiting, hemoptoses in Oct 2015.  [facility] CT scan that revealed a 3.8cm cecal mass with associated ileocecal intussusception, and a 9.1cm solid and cystic mass in the left pelvis, likely a peritoneal met with ascites.  EGD was negative. She had exploratory laparoscopy on 09/18/2014 by Dr. WHulen Skains and underwent right hemicolectomy and ileocecectomy. The pathology reviewed also related an invasive adenocarcinoma at the terminal ileum and cecum. She was discharged home on 09/22/2014.  She underwent a second surgery for left pelvis cystic mass resection on 11/01/2014, surgical path showed ovarian cancer.  INTERIM HISTORY: TRidhireturns for follow-up. She recently underwent right to left femoral-femoral bypass with right common and superficial femoral endarterectomy by Dr. FEden Lathefor left lower extremity claudication and worsening rest pain. Her left leg pain has improved significantly. She is able to put her shoes on left foot. She is able to walk independently without difficulty. Her lower abdominal pain has resolved. She gained some weight.  MEDICAL HISTORY:  Past Medical History  Diagnosis Date  . Hypertension   . Ectopic pregnancy   . Head injury, closed, with brief LOC     had a cut- as a child  . Asthma   . Anxiety   . Depression   . PTSD (post-traumatic stress disorder)   . Arthritis   . Pelvic cyst   . Complication of anesthesia     woke and saw long tube coming out of mouth . sat up then went back to sleep  . PONV (postoperative nausea and vomiting)   . Headache   . Cancer     colon and  ovarian  . Anemia   . History of blood transfusion     54 approximately   . Family history of ovarian cancer   . Family history of adverse reaction to anesthesia     sister on dialysis- hypotension   . GERD (gastroesophageal reflux disease)     occasional  . PAD (peripheral artery disease) 04/18/2015  PTSD    SURGICAL HISTORY: Past Surgical History  Procedure Laterality Date  . Esophagogastroduodenoscopy N/A 09/17/2014    Procedure: ESOPHAGOGASTRODUODENOSCOPY (EGD);  Surgeon: Jeryl Columbia, MD;  Location: [facility] END[facility]COPY;  Service: Endoscopy;  Laterality: N/A;  . Abdominal hysterectomy    . Tubal ligation    . Partial colectomy Right 09/18/2014    Procedure: RIGHT HEMI COLECTOMY;  Surgeon: Doreen Salvage, MD;  Location: [place];  Service: General;  Laterality: Right;  . Ileostomy Right 09/18/2014    Procedure: ILEOCOLOSTOMY;  Surgeon: Doreen Salvage, MD;  Location: [place];  Service: General;  Laterality: Right;  . Laparotomy N/A 11/01/2014    Procedure: EXPLORATORY LAPAROTOMY;  Surgeon: Doreen Salvage, MD;  Location: [place];  Service: General;  Laterality: N/A;  . Mass excision  11/01/2014    Procedure: EXCISION PELVIC MASS;  Surgeon: Doreen Salvage, MD;  Location: [place];  Service: General;;  . Robotic assisted total hysterectomy with bilateral salpingo oopherectomy Right 12/26/2014    Procedure: EXPLORATORY LAPAROSCOPY  RIGHT SALPINGO OOPHORECTOMY OMENTECTOMY LYMPHADECTOMY ;  Surgeon: Everitt Amber, MD;  Location: [facility];  Service: Gynecology;  Laterality: Right;  . Laparotomy N/A 12/26/2014    Procedure:  LAPAROTOMY ;  Surgeon: Everitt Amber, MD;  Location: [facility];  Service: Gynecology;  Laterality: N/A;  . Oophorectomy  2016  . Colonoscopy    . Femoral-femoral bypass graft Bilateral 04/18/2015    Procedure:  RIGHT COMMON FEMORAL TO LEFT COMMON  FEMORAL  ARTERY BYPASS WITH  8 MM HEMASHIELD GRAFT,RIGHT COMMON SUPERFICIAL FEMORAL ARTERY ENDARTERECTOMY;  Surgeon: Elam Dutch, MD;  Location: [place];  Service: Vascular;  Laterality: Bilateral;    SOCIAL HISTORY: History   Social History  . Marital Status: Widowed    Spouse Name: N/A  . Number of Children: 6  . Years of Education: 13   Occupational History  . Disability    Social History Main Topics  . Smoking status: Never Smoker   . Smokeless tobacco: Never Used      Comment: pack per month of cigars = 5  . Alcohol Use: No     Comment: socially   . Drug Use: Yes    Special: Marijuana     Comment: 04/17/15- 2 weeks ago  . Sexual Activity: No   Other Topics Concern  . Not on file   Social History Narrative   Born and raised in [place], [place]. Currently resides in a house by herself. No pets. Fun: Read, shoot pool.    Denies religious beliefs that would effect health care.    Lives at home alone.   Right-handed.   No caffeine use.    FAMILY HISTORY: Family History  Problem Relation Age of Onset  . Brain cancer Sister 41  . Lung cancer Brother 67  . Heart attack Mother   . Heart attack Father     49s  . Heart attack Maternal Uncle   . Diabetes Maternal Uncle   . Ovarian cancer Paternal Aunt     3 paternal aunts with ovarian cancer  . Diabetes Paternal Uncle   . Heart attack Brother  ALLERGIES:  is allergic to other; demerol; morphine and related; oxycodone; penicillins; tylenol; and ibuprofen.  MEDICATIONS:  Current Outpatient Prescriptions  Medication Sig Dispense Refill  . docusate sodium 100 MG CAPS Take 100 mg by mouth 2 (two) times daily as needed for mild constipation. (Patient taking differently: Take 100 mg by mouth 2 (two) times daily as needed (constipation). ) 10 capsule 0  . HYDROcodone-acetaminophen (LORTAB) 7.5-325 MG per tablet Take 1 tablet by mouth every 6 (six) hours as needed for moderate pain. (Patient taking differently: Take 2 tablets by mouth every 6 (six) hours as needed for moderate pain. ) 30 tablet 0  . pregabalin (LYRICA) 100 MG capsule 2 tabs tid (Patient taking differently: Take 200 mg by mouth 3 (three) times daily. ) 180 capsule 6  . ondansetron (ZOFRAN) 8 MG tablet Take 1 tablet (8 mg total) by mouth every 8 (eight) hours as needed for nausea or vomiting. (Patient not taking: Reported on 05/02/2015) 20 tablet 3   No current facility-administered medications for this visit.    REVIEW OF SYSTEMS:     Constitutional: Denies fevers, chills or abnormal night sweats, (+) weight loss and fatigue  Eyes: Denies blurriness of vision, double vision or watery eyes Ears, nose, mouth, throat, and face: Denies mucositis or sore throat Respiratory: Denies cough, (+) dyspnea on exertion, no wheezes Cardiovascular: Denies palpitation, chest discomfort or lower extremity swelling Gastrointestinal:  (+) nausea and abdominal pain at surgical site, no heartburn or change in bowel habits Skin: Denies abnormal skin rashes Lymphatics: Denies new lymphadenopathy or easy bruising Neurological:see HPI  Behavioral/Psych: Mood is stable, no new changes  All other systems were reviewed with the patient and are negative.  PHYSICAL EXAMINATION: ECOG PERFORMANCE STATUS: 2  Filed Vitals:   05/11/15 1323  BP: 118/81  Pulse: 80  Temp: 98.6 F (37 C)  Resp: 19   Filed Weights   05/11/15 1323  Weight: 125 lb 9.6 oz (56.972 kg)    GENERAL:alert, slightly distressed due to her pain SKIN: skin color, texture, turgor are normal, no rashes or significant lesions EYES: normal, conjunctiva are pink and non-injected, sclera clear OROPHARYNX:no exudate, no erythema and lips, buccal mucosa, and tongue normal  NECK: supple, thyroid normal size, non-tender, without nodularity LYMPH:  no palpable lymphadenopathy in the cervical, axillary or inguinal LUNGS: clear to auscultation and percussion with normal breathing effort HEART: regular rate & rhythm and no murmurs and no lower extremity edema ABDOMEN:abdomen soft, (+) tenderness at middle and low abdomen with gentle palpitation, no rebound pain, normal bowel sounds, no organmegaly. She has a stable left over at the surgical scar, which was removed by my nurse today. Musculoskeletal:no cyanosis of digits and no clubbing  PSYCH: alert & oriented x 3 with fluent speech NEURO: Left lower extremity very sensitive to touch, no focal neuro deficits.  LABORATORY DATA:  I have  reviewed the data as listed Lab Results  Component Value Date   WBC 6.2 04/19/2015   HGB 12.8 04/19/2015   HCT 35.8* 04/19/2015   MCV 96.5 04/19/2015   PLT 146* 04/19/2015    Recent Labs  12/29/14 0446 01/11/15 1116 02/09/15 1245 03/14/15 1105 04/16/15 0851 04/19/15 0248  NA 137 138 139 143 142 138  K 3.9 3.3* 4.0 3.8 3.5 4.0  CL 105 102  --   --   --  102  CO2 24 25 27 23 24 28   GLUCOSE 83 100* 103 86 69* 122*  BUN 13 17 18.4  17.8 10.7 9  CREATININE 0.75 0.66 1.0 0.8 0.7 0.74  CALCIUM 8.5 9.6 9.7 10.0 9.2 9.0  GFRNONAA >90 >90  --   --   --  >60  GFRAA >90 >90  --   --   --  >60  PROT  --   --  7.9 8.1 7.6  --   ALBUMIN  --   --  3.8 4.3 3.9  --   AST  --   --  21 24 19   --   ALT  --   --  14 19 10   --   ALKPHOS  --   --  76 72 62  --   BILITOT  --   --  0.55 0.44 0.51  --      PATHOLOGY REPORT 09/18/2014 Colon, segmental resection for tumor, terminal ileum and right - ULCERATED AND INVASIVE ADENOCARCINOMA SEE COMMENT. - NEGATIVE FOR LYMPH VASCULAR INVASION - NEGATIVE FOR PERINEURAL INVASION. 1 of 4 FINAL for Feig, Jennafer [phone]) Diagnosis(continued) - TUMOR EXTENDS INTO PERICOLONIC SOFT TISSUE AND IS LESS THAN 1 MM FROM THE SEROSAL SURFACE. - TWENTY-THREE LYMPH NODES, NEGATIVE FOR TUMOR (0/23) - ONE SOFT TISSUE TUMOR DEPOSIT. - SURGICAL MARGINS, NEGATIVE FOR TUMOR. Microscopic Comment COLON AND RECTUM (INCLUDING TRANS-ANAL RESECTION): Specimen: Terminal ileum and right colon. Procedure: Right colectomy. Tumor site: Cecum. Specimen integrity: Intact. Macroscopic intactness of mesorectum: N/A Macroscopic tumor perforation: Absent. Invasive tumor: Maximum size: 4.7 cm Histologic type(s): Adenocarcinoma Histologic grade and differentiation: G2: moderately differentiated/low grade Type of polyp in which invasive carcinoma arose: Tubular adenoma with high grade dysplasia Microscopic extension of invasive tumor: Tumor extends into pericolonic soft  tissue, see comment. Lymph-Vascular invasion: Absent. Peri-neural invasion: Absent. Tumor deposit(s) (discontinuous extramural extension): (x1) Resection margins: Proximal margin: Negative. Distal margin: Negative. Circumferential (radial) (posterior ascending, posterior descending; lateral and posterior mid-rectum; and entire lower 1/3 rectum): Negative Mesenteric margin (sigmoid and transverse): N/A Distance closest margin (if all above margins negative): 3.3 cm (distal) Trans-anal resection margins only: Deep margin: N/A Mucosal Margin: N/A Distance closest mucosal margin (if negative): N/A Treatment effect (neo-adjuvant therapy): None. Additional polyp(s): None. Non-neoplastic findings: None Lymph nodes: number examined- 23; number positive: 0 Pathologic Staging: pT3, pN1c, pMX, see comment. Ancillary studies: Per the [place] gastrointestinal working group guidelines, tumor will be submitted for both mismatch repair protein expression by immunohistochemistry and microsatellite instability testing by PCR. The results will be reported in an add on. Comment: Although tumor is not directly involving the serosa (visceral peritoneum), tumor is less than 1 mm from the serosal surface and is associated with fibroinflammatory inflammation involving the serosa. Some studies consider this finding to represent serosal involvement.  Tumor MSI: STABLE   Ovary, left and cystic mass 11/01/2014 - MIXED ENDOMETRIOID AND CLEAR CELL CARCINOMA, FIGO GRADE III/III, OVARIAN PRIMARY. - FOCAL FALLOPIAN TUBE CARCINOMA IN SITU. - SEE ONCOLOGY TABLE BELOW.  Specimen(s): Left ovary and left fallopian tube. Procedure: (including lymph node sampling) Salpingo-oophorectomy. Primary tumor site (including laterality): Left ovary. Ovarian surface involvement: Present. Ovarian capsule intact without fragmentation: Disrupted. Maximum tumor size (cm): At least 3.7 cm. Histologic type: Mixed endometrioid and  clear cell adenocarcinoma. Grade: III Peritoneal implants: (specify invasive or non-invasive): Not identified. Pelvic extension (list additional structures on separate lines and if involved): Not identified. Lymph nodes: number examined 0 ; number positive N/A TNM code: pT1c, pNX FIGO Stage (based on pathologic findings, needs clinical correlation): IC, at least. Comments: The carcinoma present in the current specimen  is morphologically dissimilar from the tumor in the patient's previous colectomy, [phone]. The tumor cells in the current case are positive for cytokeratin 7 and estrogen receptor. There is faint staining for p53. Cytokeratin 20 and CDX2 are negative. 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 There is a small focus of carcinoma in situ present in fallopian tube epithelium in random tissue. Dr Claudette Laws has reviewed the case and concurs with the interpretation. The case was discussed with Dr Hulen Skains on 11/06/2014. (  RADIOGRAPHIC STUDIES: I have personally reviewed the radiological images as listed and agreed with the findings in the report.  Ct Abdomen Pelvis W Contrast 10/09/2014   IMPRESSION: Postoperative change consistent with resection of cecal tumor. Stable 9.1 cm solid and cystic mass in the left pelvis concerning for peritoneal metastasis.      ASSESSMENT & PLAN:  54 year old female with past medical history of arthritis, hysterectomy, history of abdominal bleeding status post embolization in 2005, who presented with abdominal pain nausea vomiting and weight loss. CT of abdomen showed a 3.8cm cecal mass with associated ileocecal intussusception, and a 9.1cm solid and cystic mass in the left pelvis, likely a peritoneal met with ascites. She is status post right hemicolectomy and terminal ileoectomy on 09/18/2014, and left Salpingo-oophorectomy on 11/01/14.   1. Right colon cancer,  PT3pN1M0, stage III, MSI-stable  -I reviewed her CT scan and the surgical pathology findings with her in details. Based on her surgical pathology, she has stage III disease.  -Her tumor microsatellite stability test showed stable, she unlikely has Lynch syndrome. -Althrough I recommended adjuvant chemotherapy, which is standard care for stage III colon cancer, her chemotherapy was postponed due to her multiple surgery, significance of pain after surgeries, and compliance issue (multiple no show), she was 5 months out of initial colon surgery when she returned for follow up after surgery, and the benefit of adjuvant chemotherapy was minimal at that point. So she did not receive adjuvant chemotherapy -we discussed survelliance plan, for a total of 5 years. I recommend repeating the CT scan, however she has been overwhelmed by multiple surgery, and declined CT scan at this point. -Her CEA today is 0.7, CBC and CMP are unremarkable. We will continue monitoring.  2. Ovary cancer, left, pT1 cNX M0, at least stage I, mixed endometrioid and clear cell histology, grade 3, ER positive. - She underwent right right oophorectomy and  Pelvic lymph nodes biopsy for staging, which wall over negative. -She is likely need adjuvant chemotherapy, the standard therapy is carboplatin and paclitaxel for overan cancer. She is close to 3 months out of her initial ovary surgery, and she developed left lower extremity neuropathy after her recent surgery, not a great candidate for chemotherapy. I discussed with Dr. Denman George about her adjuvant adjuvant chemotherapy. Dr. Denman George feels her colon cancer is at much high risk for recurrence than ovarian cancer, and she is also quite far our from her initial ovaran cancer surgery, the benefit of chemotherapy for ovarian cancer is very limited. Due to her limited PS and pain issue, she is also not a good candidate for chemo.  -Her repeated CEA 125 is normal at 8 today  3. BRCA1 mutation  carrier -I discussed her recent genetic testing results which revealed positive BRCA1 mutation. -I discussed extensively about the risk of breast cancer (57-65% by age of 66), and I recommend starting screening with mammogram. She has never had a mammogram. -She was  a very overwhelmed when I touched the subject of high risk for breast cancer and prophylactic bilateral mastectomy, she broke into tears, and states "I cannot take this any more" -I'll review the subject again when she returns for follow-up in 3 months.   3. abdominal and left lower extremity neuropathy   -Likely related to her peripheral vascular disease and a neuropathy of the surgery. -Improved after vascular surgery. -She has been evaluated by neurologist Dr. Krista Blue, she will follow-up with her.  4. Coping and social support, history of depression and PTSD -She has had some difficulty coping with her synchronized colon and ovarian cancer. She lives alone, does not want her children to be involved much in her cancer care. I encouraged her to discuss with our social worker here. I expressed my emotional support to her.  Plan -Follow up in 3 months with lab   All questions were answered. The patient knows to call the clinic with any problems, questions or concerns. I spent 20 minutes counseling the patient face to face. The total time spent in the appointment was 30 minutes and more than 50% was on counseling.     Truitt Merle, MD 05/11/2015

## 2015-05-11 NOTE — Telephone Encounter (Signed)
Pt confirmed MD visit per 06/17 POF, gave pt AVS and Calendar... KJ

## 2015-05-12 ENCOUNTER — Encounter: Payer: Self-pay | Admitting: Hematology

## 2015-05-12 DIAGNOSIS — Z1501 Genetic susceptibility to malignant neoplasm of breast: Secondary | ICD-10-CM | POA: Insufficient documentation

## 2015-05-12 DIAGNOSIS — Z1509 Genetic susceptibility to other malignant neoplasm: Secondary | ICD-10-CM

## 2015-05-12 LAB — CA 125: CA 125: 8 U/mL (ref <35)

## 2015-05-12 LAB — CEA: CEA: 0.7 ng/mL (ref 0.0–5.0)

## 2015-05-24 ENCOUNTER — Encounter: Payer: Self-pay | Admitting: Nutrition

## 2015-05-24 NOTE — Progress Notes (Signed)
Patient requesting oral nutrition supplements. I have previously provided patient with 3 complimentary cases of Ensure Plus. I will mail coupons to patient for Ensure and Boost.

## 2015-05-25 ENCOUNTER — Encounter: Payer: Self-pay | Admitting: Vascular Surgery

## 2015-05-31 ENCOUNTER — Ambulatory Visit (INDEPENDENT_AMBULATORY_CARE_PROVIDER_SITE_OTHER): Payer: Self-pay | Admitting: Vascular Surgery

## 2015-05-31 ENCOUNTER — Encounter: Payer: Self-pay | Admitting: Vascular Surgery

## 2015-05-31 VITALS — BP 103/70 | HR 67 | Ht 67.0 in | Wt 134.0 lb

## 2015-05-31 DIAGNOSIS — M79672 Pain in left foot: Secondary | ICD-10-CM

## 2015-05-31 NOTE — Progress Notes (Signed)
Patient is a 54 year old female who returns for follow-up today. She underwent a right to left femoral-femoral bypass on 04/18/2015. She denies any drainage from her incisions. Unfortunately she continues to smoke intermittently cigars as well as marijuana. She was counseled against this today. Spent greater than 3 minutes today regarding smoking cessation counseling. She denies any true claudication-type symptoms. However she still complains of some pain in her left foot. She states this is about 20% better than it used to be. However she still has pain in the left foot requiring hydrocodone. Recent x-ray of the foot showed no fracture but did have osteopenic areas and the mineralization suggestive of reflux sympathetic dystrophy and consideration for bone scan.  Review of systems: She denies any chest pain. She denies any shortness of breath.  Physcal exam:  Filed Vitals:   05/31/15 1033  BP: 103/70  Pulse: 67  Height: '5\' 7"'$  (1.702 m)  Weight: 134 lb (60.782 kg)  SpO2: 100%    Extremities: Palpable graft pulse in the suprapubic region, healing groin incisions, no palpable popliteal or pedal pulses, both feet pink warm and well-perfused left foot slightly more red on the dorsal surface compared to the right  Data: Postop ABIs were reviewed which were approximated 0.5-0.6 bilaterally.  Assessment: Patent femoral-femoral bypass still with pain left foot some of this may be reperfusion but also consideration needs to be given for "RSD". The patient has improved somewhat. She'll return for follow-up in one month for graft duplex scan and duplex of bilateral lower extremities as well as ABIs. If she is continuing to have symptoms we may consider a bone scan or consider CT angiogram for further follow-up evaluation of her lower extremity arterial tree.  Ruta Hinds, MD [facility] [place] Office: [phone] Pager: [phone]

## 2015-06-01 ENCOUNTER — Telehealth: Payer: Self-pay | Admitting: *Deleted

## 2015-06-01 DIAGNOSIS — C189 Malignant neoplasm of colon, unspecified: Secondary | ICD-10-CM

## 2015-06-01 MED ORDER — HYDROCODONE-ACETAMINOPHEN 7.5-325 MG PO TABS
2.0000 | ORAL_TABLET | Freq: Four times a day (QID) | ORAL | Status: DC | PRN
Start: 2015-06-01 — End: 2015-06-19

## 2015-06-01 NOTE — Telephone Encounter (Signed)
NOTIFIED PT. THAT THE PRESCRIPTION WILL BE IN THE INFUSION ROOM TOMORROW. SHE VOICES UNDERSTANDING.

## 2015-06-06 ENCOUNTER — Ambulatory Visit: Payer: Medicare Other | Admitting: Neurology

## 2015-06-14 ENCOUNTER — Telehealth: Payer: Self-pay | Admitting: *Deleted

## 2015-06-18 ENCOUNTER — Telehealth: Payer: Self-pay

## 2015-06-18 NOTE — Telephone Encounter (Signed)
Pt called for refil of hydrocodone/APAP

## 2015-06-19 ENCOUNTER — Other Ambulatory Visit: Payer: Self-pay | Admitting: *Deleted

## 2015-06-19 ENCOUNTER — Telehealth: Payer: Self-pay | Admitting: *Deleted

## 2015-06-19 DIAGNOSIS — C189 Malignant neoplasm of colon, unspecified: Secondary | ICD-10-CM

## 2015-06-19 MED ORDER — HYDROCODONE-ACETAMINOPHEN 7.5-325 MG PO TABS: 2.0000 | ORAL_TABLET | Freq: Four times a day (QID) | ORAL | Status: DC | PRN

## 2015-06-19 NOTE — Telephone Encounter (Signed)
Spoke with pt and informed pt that script for pain med is ready for pt to pick up in am 06/20/15.  Pt voiced understanding.

## 2015-06-19 NOTE — Telephone Encounter (Signed)
Refill: Lortab

## 2015-06-26 ENCOUNTER — Encounter: Payer: Self-pay | Admitting: Neurology

## 2015-06-29 ENCOUNTER — Encounter: Payer: Self-pay | Admitting: Genetic Counselor

## 2015-07-02 ENCOUNTER — Other Ambulatory Visit: Payer: Self-pay | Admitting: *Deleted

## 2015-07-02 DIAGNOSIS — Z48812 Encounter for surgical aftercare following surgery on the circulatory system: Secondary | ICD-10-CM

## 2015-07-02 DIAGNOSIS — I739 Peripheral vascular disease, unspecified: Secondary | ICD-10-CM

## 2015-07-04 ENCOUNTER — Encounter: Payer: Self-pay | Admitting: Vascular Surgery

## 2015-07-05 ENCOUNTER — Encounter: Payer: Self-pay | Admitting: Vascular Surgery

## 2015-07-05 ENCOUNTER — Ambulatory Visit (INDEPENDENT_AMBULATORY_CARE_PROVIDER_SITE_OTHER): Payer: Self-pay | Admitting: Vascular Surgery

## 2015-07-05 ENCOUNTER — Other Ambulatory Visit: Payer: Self-pay | Admitting: Vascular Surgery

## 2015-07-05 ENCOUNTER — Ambulatory Visit (INDEPENDENT_AMBULATORY_CARE_PROVIDER_SITE_OTHER)
Admission: RE | Admit: 2015-07-05 | Discharge: 2015-07-05 | Disposition: A | Discharge status: Home / Self Care | Payer: Medicare Other | Source: Ambulatory Visit | Attending: Vascular Surgery | Admitting: Vascular Surgery

## 2015-07-05 ENCOUNTER — Ambulatory Visit (HOSPITAL_COMMUNITY)
Admission: RE | Admit: 2015-07-05 | Discharge: 2015-07-05 | Disposition: A | Discharge status: Home / Self Care | Payer: Medicare Other | Source: Ambulatory Visit | Attending: Vascular Surgery | Admitting: Vascular Surgery

## 2015-07-05 ENCOUNTER — Telehealth: Payer: Self-pay

## 2015-07-05 VITALS — BP 146/90 | HR 71 | Temp 98.4°F | Resp 16 | Ht 67.0 in | Wt 144.0 lb

## 2015-07-05 DIAGNOSIS — Z48812 Encounter for surgical aftercare following surgery on the circulatory system: Secondary | ICD-10-CM

## 2015-07-05 DIAGNOSIS — R200 Anesthesia of skin: Secondary | ICD-10-CM | POA: Insufficient documentation

## 2015-07-05 DIAGNOSIS — R208 Other disturbances of skin sensation: Secondary | ICD-10-CM

## 2015-07-05 DIAGNOSIS — I739 Peripheral vascular disease, unspecified: Secondary | ICD-10-CM

## 2015-07-05 DIAGNOSIS — M25475 Effusion, left foot: Secondary | ICD-10-CM

## 2015-07-05 DIAGNOSIS — M25476 Effusion, unspecified foot: Secondary | ICD-10-CM | POA: Insufficient documentation

## 2015-07-05 DIAGNOSIS — M25579 Pain in unspecified ankle and joints of unspecified foot: Secondary | ICD-10-CM | POA: Insufficient documentation

## 2015-07-05 DIAGNOSIS — M25572 Pain in left ankle and joints of left foot: Secondary | ICD-10-CM

## 2015-07-05 NOTE — Progress Notes (Signed)
Patient is a 54 year old female who underwent right to left femoral-femoral bypass 04/18/2015. This was done primarily for rest pain in the left foot. She continues to have some pain in the left foot but overall this is improved. She also still has some edema in the left foot. She states that the foot is 70% better than preoperatively. Her incisions are well-healed. Recent left foot x-ray showed no evidence of fracture but demineralization possibly RSD.  She is still requiring narcotic pain medication for her left foot.  Review of systems: She denies shortness of breath. She denies chest pain.  Physical exam:  Filed Vitals:   07/05/15 1550 07/05/15 1556  BP: 151/87 146/90  Pulse: 57 71  Temp: 98.4 F (36.9 C)   TempSrc: Oral   Resp: 16   Height: '5\' 7"'$  (1.702 m)   Weight: 144 lb (65.318 kg)   SpO2: 100%     Extremities: No palpable pedal pulses well-healed groin incisions palpable femorofemoral graft pulse  Musculoskeletal: Diffuse edema left foot compared to right approximately 20% larger mildly tender on palpation diffusely no open wounds  Data: Patient had a duplex ultrasound today of her femorofemoral bypass which suggested possible inflow narrowing of the femorofemoral. Her ABIs today were 0.6 bilaterally. Duplex of her lower extremities was remarkable for the fact that there was monophasic flow diffusely again suggesting an inflow occlusive disease process.  Assessment: Persistent left foot pain despite revascularization potentially still related to reperfusion syndrome. However this should be resolving at this point. Previous plain film x-ray suggested possibly a bone scan to rule out RSD. We will schedule this for her. Due to possible inflow stenosis of Aleve she also need CT Angio the abdomen and pelvis with runoff to make sure that her femorofemoral is not at risk for occlusion. She'll return for follow-up after both of these scans.  Plan: See above  Ruta Hinds, MD [facility] [place] Office: [phone] Pager: [phone]

## 2015-07-05 NOTE — Telephone Encounter (Signed)
Left a message for patient to call.   RE: Mammogram information needed.   Where and when if completed. Please send response to individual that documented this call.

## 2015-07-05 NOTE — Progress Notes (Signed)
Filed Vitals:   07/05/15 1550 07/05/15 1556  BP: 151/87 146/90  Pulse: 57 71  Temp: 98.4 F (36.9 C)   TempSrc: Oral   Resp: 16   Height: '5\' 7"'$  (1.702 m)   Weight: 144 lb (65.318 kg)   SpO2: 100%

## 2015-07-06 NOTE — Addendum Note (Signed)
Addended by: Dorthula Rue L on: 07/06/2015 04:43 PM   Modules accepted: Orders

## 2015-07-09 ENCOUNTER — Telehealth: Payer: Self-pay | Admitting: *Deleted

## 2015-07-09 ENCOUNTER — Other Ambulatory Visit: Payer: Self-pay | Admitting: Hematology

## 2015-07-09 ENCOUNTER — Other Ambulatory Visit: Payer: Self-pay | Admitting: *Deleted

## 2015-07-09 DIAGNOSIS — C189 Malignant neoplasm of colon, unspecified: Secondary | ICD-10-CM

## 2015-07-09 MED ORDER — HYDROCODONE-ACETAMINOPHEN 7.5-325 MG PO TABS: 2.0000 | ORAL_TABLET | Freq: Four times a day (QID) | ORAL | Status: DC | PRN

## 2015-07-09 NOTE — Telephone Encounter (Signed)
Patient would like a medication refill for lortab. Patient would like for you to call her once it is ready for pick up. Message sent to [facility] ([place]

## 2015-07-10 ENCOUNTER — Telehealth: Payer: Self-pay | Admitting: Hematology

## 2015-07-10 NOTE — Telephone Encounter (Signed)
Referral per 08/16 POF, s/w Dr. Carroll Kinds scheduler, please fax over demographic and notes to [phone] to attn: Diane, once MD review the notes she will contact pt with D/T for apt... KJ

## 2015-07-12 ENCOUNTER — Other Ambulatory Visit: Payer: Self-pay | Admitting: *Deleted

## 2015-07-12 DIAGNOSIS — M79606 Pain in leg, unspecified: Secondary | ICD-10-CM

## 2015-07-12 DIAGNOSIS — Z01810 Encounter for preprocedural cardiovascular examination: Secondary | ICD-10-CM

## 2015-07-13 DIAGNOSIS — Z48812 Encounter for surgical aftercare following surgery on the circulatory system: Secondary | ICD-10-CM | POA: Diagnosis not present

## 2015-07-13 DIAGNOSIS — M7989 Other specified soft tissue disorders: Secondary | ICD-10-CM | POA: Diagnosis not present

## 2015-07-13 DIAGNOSIS — Z9889 Other specified postprocedural states: Secondary | ICD-10-CM | POA: Diagnosis not present

## 2015-07-13 DIAGNOSIS — M25475 Effusion, left foot: Secondary | ICD-10-CM | POA: Diagnosis not present

## 2015-07-13 DIAGNOSIS — R208 Other disturbances of skin sensation: Secondary | ICD-10-CM | POA: Diagnosis not present

## 2015-07-13 DIAGNOSIS — M25572 Pain in left ankle and joints of left foot: Secondary | ICD-10-CM | POA: Diagnosis not present

## 2015-07-13 DIAGNOSIS — R937 Abnormal findings on diagnostic imaging of other parts of musculoskeletal system: Secondary | ICD-10-CM | POA: Diagnosis not present

## 2015-07-13 DIAGNOSIS — M79671 Pain in right foot: Secondary | ICD-10-CM | POA: Diagnosis not present

## 2015-07-16 ENCOUNTER — Telehealth: Payer: Self-pay | Admitting: Hematology

## 2015-07-16 NOTE — Telephone Encounter (Signed)
FAXED PT MEDICAL RECORDS TO DR [facility] OFFICE

## 2015-07-26 ENCOUNTER — Other Ambulatory Visit: Payer: Self-pay | Admitting: Hematology

## 2015-07-26 ENCOUNTER — Telehealth: Payer: Self-pay | Admitting: *Deleted

## 2015-07-26 ENCOUNTER — Ambulatory Visit: Payer: Medicare Other | Admitting: Vascular Surgery

## 2015-07-26 DIAGNOSIS — C189 Malignant neoplasm of colon, unspecified: Secondary | ICD-10-CM

## 2015-07-26 MED ORDER — HYDROCODONE-ACETAMINOPHEN 7.5-325 MG PO TABS
2.0000 | ORAL_TABLET | Freq: Four times a day (QID) | ORAL | Status: DC | PRN
Start: 2015-07-26 — End: 2015-08-21

## 2015-07-26 MED ORDER — HYDROCODONE-ACETAMINOPHEN 7.5-325 MG PO TABS
2.0000 | ORAL_TABLET | Freq: Four times a day (QID) | ORAL | Status: DC | PRN
Start: 2015-07-26 — End: 2015-07-26

## 2015-07-26 NOTE — Telephone Encounter (Signed)
I refilled, please let pt know to pick it up.  Catherine Rogers

## 2015-07-26 NOTE — Telephone Encounter (Signed)
Called pt to p/u script in am

## 2015-07-26 NOTE — Telephone Encounter (Signed)
CALL PT. WHEN PRESCRIPTION IS READY.

## 2015-07-27 ENCOUNTER — Other Ambulatory Visit: Payer: Self-pay | Admitting: *Deleted

## 2015-07-27 DIAGNOSIS — M19072 Primary osteoarthritis, left ankle and foot: Secondary | ICD-10-CM

## 2015-07-31 ENCOUNTER — Encounter: Payer: Self-pay | Admitting: Vascular Surgery

## 2015-08-01 ENCOUNTER — Ambulatory Visit: Payer: Medicare Other | Admitting: Vascular Surgery

## 2015-08-10 ENCOUNTER — Ambulatory Visit (HOSPITAL_BASED_OUTPATIENT_CLINIC_OR_DEPARTMENT_OTHER): Payer: Medicare Other | Admitting: Hematology

## 2015-08-10 ENCOUNTER — Other Ambulatory Visit (HOSPITAL_BASED_OUTPATIENT_CLINIC_OR_DEPARTMENT_OTHER): Payer: Medicare Other

## 2015-08-10 ENCOUNTER — Other Ambulatory Visit: Payer: Self-pay | Admitting: Hematology

## 2015-08-10 ENCOUNTER — Encounter: Payer: Self-pay | Admitting: Hematology

## 2015-08-10 ENCOUNTER — Telehealth: Payer: Self-pay | Admitting: Hematology

## 2015-08-10 ENCOUNTER — Telehealth: Payer: Self-pay | Admitting: *Deleted

## 2015-08-10 VITALS — BP 141/72 | HR 58 | Temp 98.4°F | Resp 18 | Ht 67.0 in | Wt 157.7 lb

## 2015-08-10 DIAGNOSIS — C189 Malignant neoplasm of colon, unspecified: Secondary | ICD-10-CM | POA: Diagnosis not present

## 2015-08-10 DIAGNOSIS — Z1501 Genetic susceptibility to malignant neoplasm of breast: Secondary | ICD-10-CM | POA: Diagnosis not present

## 2015-08-10 DIAGNOSIS — C562 Malignant neoplasm of left ovary: Secondary | ICD-10-CM

## 2015-08-10 DIAGNOSIS — Z1231 Encounter for screening mammogram for malignant neoplasm of breast: Secondary | ICD-10-CM

## 2015-08-10 DIAGNOSIS — Z1509 Genetic susceptibility to other malignant neoplasm: Principal | ICD-10-CM

## 2015-08-10 LAB — COMPREHENSIVE METABOLIC PANEL (CC13)
ALT: 60 U/L — ABNORMAL HIGH (ref 0–55)
AST: 60 U/L — ABNORMAL HIGH (ref 5–34)
Albumin: 3.9 g/dL (ref 3.5–5.0)
Alkaline Phosphatase: 101 U/L (ref 40–150)
Anion Gap: 6 meq/L (ref 3–11)
BUN: 23.4 mg/dL (ref 7.0–26.0)
CO2: 30 meq/L — ABNORMAL HIGH (ref 22–29)
Calcium: 9.7 mg/dL (ref 8.4–10.4)
Chloride: 105 meq/L (ref 98–109)
Creatinine: 0.9 mg/dL (ref 0.6–1.1)
EGFR: 82 ml/min/1.73 m2 — ABNORMAL LOW
Glucose: 76 mg/dL (ref 70–140)
Potassium: 4.8 meq/L (ref 3.5–5.1)
Sodium: 141 meq/L (ref 136–145)
Total Bilirubin: 0.6 mg/dL (ref 0.20–1.20)
Total Protein: 7.5 g/dL (ref 6.4–8.3)

## 2015-08-10 LAB — CBC WITH DIFFERENTIAL/PLATELET
BASO%: 0.4 % (ref 0.0–2.0)
BASOS ABS: 0 10*3/uL (ref 0.0–0.1)
EOS%: 1.6 % (ref 0.0–7.0)
Eosinophils Absolute: 0.1 10*3/uL (ref 0.0–0.5)
HCT: 41.7 % (ref 34.8–46.6)
HGB: 14.5 g/dL (ref 11.6–15.9)
LYMPH%: 37.2 % (ref 14.0–49.7)
MCH: 36.6 pg — AB (ref 25.1–34.0)
MCHC: 34.6 g/dL (ref 31.5–36.0)
MCV: 105.6 fL — ABNORMAL HIGH (ref 79.5–101.0)
MONO#: 0.5 10*3/uL (ref 0.1–0.9)
MONO%: 10.3 % (ref 0.0–14.0)
NEUT#: 2.6 10*3/uL (ref 1.5–6.5)
NEUT%: 50.5 % (ref 38.4–76.8)
Platelets: 140 10*3/uL — ABNORMAL LOW (ref 145–400)
RBC: 3.95 10*6/uL (ref 3.70–5.45)
RDW: 13.1 % (ref 11.2–14.5)
WBC: 5.1 10*3/uL (ref 3.9–10.3)
lymph#: 1.9 10*3/uL (ref 0.9–3.3)

## 2015-08-10 NOTE — Telephone Encounter (Signed)
per pof to sch pt appt-gave pt copy of avs °

## 2015-08-10 NOTE — Progress Notes (Signed)
[place]  [phone] [phone]  Clinic follow Up Note   Patient Care Team: Golden Circle, FNP as PCP - General (Family Medicine) Truitt Merle, MD as Consulting Physician (Hematology) Judeth Horn, MD as Consulting Physician (General Surgery) Nat Math, MD as Referring Physician (Obstetrics and Gynecology) Everitt Amber, MD as Consulting Physician (Obstetrics and Gynecology) 08/10/2015   CHIEF COMPLAINTS/PURPOSE OF CONSULTATION:  1. Colon cancer, pT3N1M0, stage III 2. Ovarian cancer, pT1cNxM0, stage I  3. BRCA1 mutation (+)    Adenocarcinoma of colon   09/16/2014 Imaging CT abd/pel:  a 3.8cm cecal mass with associated ileocecal intussusception, and a 9.1cm solid and cystic mass in the left pelvis,   09/18/2014 Initial Diagnosis Adenocarcinoma of right colon   09/18/2014 Pathologic Stage pT3pN1Mx, tumor extends into pericolonic soft tissue and is less than 77m from the serosal surface, LVI 8-), PNI (-), all 23 node negative, one soft tissue tumor deposit, surgical margins negative.    09/18/2014 Surgery right hemicolectomy and terminal ileoectomy    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   10/09/2014 Imaging CT of the abdomen and pelvis showed a 9.1 cm solid and cystic mass in the left pelvis concerning for malignancy. This was discovered during her workup for colon cancer.   11/01/2014 Initial Diagnosis 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   11/01/2014 Surgery Salpingo-oophorectomy.   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 Tumor size 3.7 cm, no lymph nodes removed. T1cNx. Tumor cells are positive for cytokeratin 7 and estrogen receptor.     HISTORY OF PRESENTING ILLNESS:  [name].[id number]. female is here because of recently diagnosed colon cancer.   She presented with abdominal pain, nausea and vomiting, hemoptoses in Oct 2015.  [facility] CT scan that revealed a 3.8cm cecal mass with associated ileocecal intussusception, and a 9.1cm solid and cystic mass in the left pelvis, likely a peritoneal met with ascites.  EGD was negative. She had exploratory laparoscopy on 09/18/2014 by Dr. WHulen Skains and underwent right hemicolectomy and ileocecectomy. The pathology reviewed also related an invasive adenocarcinoma at the terminal ileum and cecum. She was discharged home on 09/22/2014.  She underwent a second surgery for left pelvis cystic mass resection on 11/01/2014, surgical path showed ovarian cancer.  INTERIM HISTORY: TTalahreturns for follow-up. I saw her 3 month ago. She continues follow-up with vascular surgeon Dr. FOneida Alarfor her left leg pain, and underwent more tested recently. She may need more bypass surgery for her low extremities. She says overall she is much better compared to 3 months ago, her energy level and appetite is much better, she gained 30 pounds back. Her main complaint is still moderate pain on both legs, more on the left, she takes hydrocodone 2 tablets every 6 hours, and I last reviewed on 2 weeks ago.  MEDICAL HISTORY:  Past Medical History  Diagnosis Date  . Hypertension   . Ectopic pregnancy   . Head injury, closed, with brief LOC     had a cut- as a child  . Asthma   . Anxiety   . Depression   . PTSD (post-traumatic stress disorder)   . Arthritis   . Pelvic cyst   . Complication of anesthesia     woke and saw long tube coming out of mouth . sat  up then went back to sleep  . PONV (postoperative nausea and vomiting)   . Headache   . Cancer     colon and ovarian  . Anemia   . History of blood transfusion     2005 approximately   . Family history of ovarian cancer   . Family history of adverse reaction to anesthesia     sister on dialysis- hypotension   . GERD (gastroesophageal  reflux disease)     occasional  . PAD (peripheral artery disease) 04/18/2015  PTSD   SURGICAL HISTORY: Past Surgical History  Procedure Laterality Date  . Esophagogastroduodenoscopy N/A 09/17/2014    Procedure: ESOPHAGOGASTRODUODENOSCOPY (EGD);  Surgeon: Jeryl Columbia, MD;  Location: [facility] ENDOSCOPY;  Service: Endoscopy;  Laterality: N/A;  . Abdominal hysterectomy    . Tubal ligation    . Partial colectomy Right 09/18/2014    Procedure: RIGHT HEMI COLECTOMY;  Surgeon: Doreen Salvage, MD;  Location: [place];  Service: General;  Laterality: Right;  . Ileostomy Right 09/18/2014    Procedure: ILEOCOLOSTOMY;  Surgeon: Doreen Salvage, MD;  Location: [place];  Service: General;  Laterality: Right;  . Laparotomy N/A 11/01/2014    Procedure: EXPLORATORY LAPAROTOMY;  Surgeon: Doreen Salvage, MD;  Location: [place];  Service: General;  Laterality: N/A;  . Mass excision  11/01/2014    Procedure: EXCISION PELVIC MASS;  Surgeon: Doreen Salvage, MD;  Location: [place];  Service: General;;  . Robotic assisted total hysterectomy with bilateral salpingo oopherectomy Right 12/26/2014    Procedure: EXPLORATORY LAPAROSCOPY  RIGHT SALPINGO OOPHORECTOMY OMENTECTOMY LYMPHADECTOMY ;  Surgeon: Everitt Amber, MD;  Location: [facility] ORS;  Service: Gynecology;  Laterality: Right;  . Laparotomy N/A 12/26/2014    Procedure:  LAPAROTOMY ;  Surgeon: Everitt Amber, MD;  Location: [facility] OR[facility];  Service: Gynecology;  Laterality: N/A;  . Oophorectomy  2016  . Colonoscopy    . Femoral-femoral bypass graft Bilateral 04/18/2015    Procedure:  RIGHT COMMON FEMORAL TO LEFT COMMON  FEMORAL  ARTERY BYPASS WITH  8 MM HEMASHIELD GRAFT,RIGHT COMMON SUPERFICIAL FEMORAL ARTERY ENDARTERECTOMY;  Surgeon: Elam Dutch, MD;  Location: [place];  Service: Vascular;  Laterality: Bilateral;    SOCIAL HISTORY: Social History   Social History  . Marital Status: Widowed    Spouse Name: N/A  . Number of Children: 6  . Years of Education: 13   Occupational History  . Disability    Social  History Main Topics  . Smoking status: Never Smoker   . Smokeless tobacco: Never Used     Comment: pack per month of cigars = 5  . Alcohol Use: No     Comment: socially   . Drug Use: Yes    Special: Marijuana     Comment: 04/17/15- 2 weeks ago  . Sexual Activity: No   Other Topics Concern  . Not on file   Social History Narrative   Born and raised in [place], [place]. Currently resides in a house by herself. No pets. Fun: Read, shoot pool.    Denies religious beliefs that would effect health care.    Lives at home alone.   Right-handed.   No caffeine use.    FAMILY HISTORY: Family History  Problem Relation Age of Onset  . Brain cancer Sister 2  . Lung cancer Brother 76  . Heart attack Mother   . Heart attack Father     74s  . Heart attack Maternal Uncle   . Diabetes Maternal Uncle   .  Ovarian cancer Paternal Aunt     3 paternal aunts with ovarian cancer  . Diabetes Paternal Uncle   . Heart attack Brother     ALLERGIES:  is allergic to other; demerol; morphine and related; oxycodone; penicillins; tylenol; and ibuprofen.  MEDICATIONS:  Current Outpatient Prescriptions  Medication Sig Dispense Refill  . docusate sodium 100 MG CAPS Take 100 mg by mouth 2 (two) times daily as needed for mild constipation. 10 capsule 0  . HYDROcodone-acetaminophen (LORTAB) 7.5-325 MG per tablet Take 2 tablets by mouth every 6 (six) hours as needed for moderate pain. 100 tablet 0  . pregabalin (LYRICA) 100 MG capsule 2 tabs tid (Patient taking differently: Take 200 mg by mouth 3 (three) times daily. ) 180 capsule 6   No current facility-administered medications for this visit.    REVIEW OF SYSTEMS:   Constitutional: Denies fevers, chills or abnormal night sweats, (+) weight loss and fatigue  Eyes: Denies blurriness of vision, double vision or watery eyes Ears, nose, mouth, throat, and face: Denies mucositis or sore throat Respiratory: Denies cough, (+) dyspnea on exertion, no  wheezes Cardiovascular: Denies palpitation, chest discomfort or lower extremity swelling Gastrointestinal:  (+) nausea and abdominal pain at surgical site, no heartburn or change in bowel habits Skin: Denies abnormal skin rashes Lymphatics: Denies new lymphadenopathy or easy bruising Neurological:see HPI  Behavioral/Psych: Mood is stable, no new changes  All other systems were reviewed with the patient and are negative.  PHYSICAL EXAMINATION: ECOG PERFORMANCE STATUS: 2  Filed Vitals:   08/10/15 1408  BP: 141/72  Pulse: 58  Temp: 98.4 F (36.9 C)  Resp: 18   Filed Weights   08/10/15 1408  Weight: 157 lb 11.2 oz (71.532 kg)    GENERAL:alert, slightly distressed due to her pain SKIN: skin color, texture, turgor are normal, no rashes or significant lesions EYES: normal, conjunctiva are pink and non-injected, sclera clear OROPHARYNX:no exudate, no erythema and lips, buccal mucosa, and tongue normal  NECK: supple, thyroid normal size, non-tender, without nodularity LYMPH:  no palpable lymphadenopathy in the cervical, axillary or inguinal LUNGS: clear to auscultation and percussion with normal breathing effort HEART: regular rate & rhythm and no murmurs and no lower extremity edema ABDOMEN:abdomen soft, (+) tenderness at middle and low abdomen with gentle palpitation, no rebound pain, normal bowel sounds, no organmegaly. She has a stable left over at the surgical scar, which was removed by my nurse today. Musculoskeletal:no cyanosis of digits and no clubbing  PSYCH: alert & oriented x 3 with fluent speech NEURO: Left lower extremity very sensitive to touch, no focal neuro deficits.  LABORATORY DATA:  I have reviewed the data as listed Lab Results  Component Value Date   WBC 5.1 08/10/2015   HGB 14.5 08/10/2015   HCT 41.7 08/10/2015   MCV 105.6* 08/10/2015   PLT 140* 08/10/2015    Recent Labs  12/29/14 0446 01/11/15 1116  04/16/15 0851 04/19/15 0248 05/11/15 1451  08/10/15 1338  NA 137 138  < > 142 138 140 141  K 3.9 3.3*  < > 3.5 4.0 4.5 4.8  CL 105 102  --   --  102  --   --   CO2 24 25  < > 24 28 27  30*  GLUCOSE 83 100*  < > 69* 122* 84 76  BUN 13 17  < > 10.7 9 19.7 23.4  CREATININE 0.75 0.66  < > 0.7 0.74 0.8 0.9  CALCIUM 8.5 9.6  < >  9.2 9.0 10.0 9.7  GFRNONAA >90 >90  --   --  >60  --   --   GFRAA >90 >90  --   --  >60  --   --   PROT  --   --   < > 7.6  --  7.7 7.5  ALBUMIN  --   --   < > 3.9  --  4.1 3.9  AST  --   --   < > 19  --  36* 60*  ALT  --   --   < > 10  --  26 60*  ALKPHOS  --   --   < > 62  --  73 101  BILITOT  --   --   < > 0.51  --  0.68 0.60  < > = values in this interval not displayed.   PATHOLOGY REPORT 09/18/2014 Colon, segmental resection for tumor, terminal ileum and right - ULCERATED AND INVASIVE ADENOCARCINOMA SEE COMMENT. - NEGATIVE FOR LYMPH VASCULAR INVASION - NEGATIVE FOR PERINEURAL INVASION. 1 of 4 FINAL for Tanabe, Anela [phone]) Diagnosis(continued) - TUMOR EXTENDS INTO PERICOLONIC SOFT TISSUE AND IS LESS THAN 1 MM FROM THE SEROSAL SURFACE. - TWENTY-THREE LYMPH NODES, NEGATIVE FOR TUMOR (0/23) - ONE SOFT TISSUE TUMOR DEPOSIT. - SURGICAL MARGINS, NEGATIVE FOR TUMOR. Microscopic Comment COLON AND RECTUM (INCLUDING TRANS-ANAL RESECTION): Specimen: Terminal ileum and right colon. Procedure: Right colectomy. Tumor site: Cecum. Specimen integrity: Intact. Macroscopic intactness of mesorectum: N/A Macroscopic tumor perforation: Absent. Invasive tumor: Maximum size: 4.7 cm Histologic type(s): Adenocarcinoma Histologic grade and differentiation: G2: moderately differentiated/low grade Type of polyp in which invasive carcinoma arose: Tubular adenoma with high grade dysplasia Microscopic extension of invasive tumor: Tumor extends into pericolonic soft tissue, see comment. Lymph-Vascular invasion: Absent. Peri-neural invasion: Absent. Tumor deposit(s) (discontinuous extramural extension):  (x1) Resection margins: Proximal margin: Negative. Distal margin: Negative. Circumferential (radial) (posterior ascending, posterior descending; lateral and posterior mid-rectum; and entire lower 1/3 rectum): Negative Mesenteric margin (sigmoid and transverse): N/A Distance closest margin (if all above margins negative): 3.3 cm (distal) Trans-anal resection margins only: Deep margin: N/A Mucosal Margin: N/A Distance closest mucosal margin (if negative): N/A Treatment effect (neo-adjuvant therapy): None. Additional polyp(s): None. Non-neoplastic findings: None Lymph nodes: number examined- 23; number positive: 0 Pathologic Staging: pT3, pN1c, pMX, see comment. Ancillary studies: Per the [place] gastrointestinal working group guidelines, tumor will be submitted for both mismatch repair protein expression by immunohistochemistry and microsatellite instability testing by PCR. The results will be reported in an add on. Comment: Although tumor is not directly involving the serosa (visceral peritoneum), tumor is less than 1 mm from the serosal surface and is associated with fibroinflammatory inflammation involving the serosa. Some studies consider this finding to represent serosal involvement.  Tumor MSI: STABLE   Ovary, left and cystic mass 11/01/2014 - MIXED ENDOMETRIOID AND CLEAR CELL CARCINOMA, FIGO GRADE III/III, OVARIAN PRIMARY. - FOCAL FALLOPIAN TUBE CARCINOMA IN SITU. - SEE ONCOLOGY TABLE BELOW.  Specimen(s): Left ovary and left fallopian tube. Procedure: (including lymph node sampling) Salpingo-oophorectomy. Primary tumor site (including laterality): Left ovary. Ovarian surface involvement: Present. Ovarian capsule intact without fragmentation: Disrupted. Maximum tumor size (cm): At least 3.7 cm. Histologic type: Mixed endometrioid and clear cell adenocarcinoma. Grade: III Peritoneal implants: (specify invasive or non-invasive): Not identified. Pelvic extension (list  additional structures on separate lines and if involved): Not identified. Lymph nodes: number examined 0 ; number positive N/A TNM code: pT1c, pNX FIGO Stage (based  on pathologic findings, needs clinical correlation): IC, at least. Comments: The carcinoma present in the current specimen is morphologically dissimilar from the tumor in the patient's previous colectomy, [phone]. The tumor cells in the current case are positive for cytokeratin 7 and estrogen receptor. There is faint staining for p53. Cytokeratin 20 and CDX2 are negative. 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 There is a small focus of carcinoma in situ present in fallopian tube epithelium in random tissue. Dr Claudette Laws has reviewed the case and concurs with the interpretation. The case was discussed with Dr Hulen Skains on 11/06/2014. (  RADIOGRAPHIC STUDIES: I have personally reviewed the radiological images as listed and agreed with the findings in the report.  Ct Abdomen Pelvis W Contrast 10/09/2014   IMPRESSION: Postoperative change consistent with resection of cecal tumor. Stable 9.1 cm solid and cystic mass in the left pelvis concerning for peritoneal metastasis.      ASSESSMENT & PLAN:  55 year old female with past medical history of arthritis, hysterectomy, history of abdominal bleeding status post embolization in 2005, who presented with abdominal pain nausea vomiting and weight loss. CT of abdomen showed a 3.8cm cecal mass with associated ileocecal intussusception, and a 9.1cm solid and cystic mass in the left pelvis,which was found to be a ovarian cancer. She is status post right hemicolectomy and terminal ileoectomy on 09/18/2014, and left Salpingo-oophorectomy on 11/01/14.   1. Right colon cancer, PT3pN1M0, stage III, MSI-stable  -I reviewed her CT scan and the surgical pathology findings with her in details. Based on her surgical  pathology, she has stage III disease.  -Her tumor microsatellite stability test showed stable, she unlikely has Lynch syndrome. -Althrough I recommended adjuvant chemotherapy, which is standard care for stage III colon cancer, her chemotherapy was postponed due to her multiple surgery, significance of pain after surgeries, and compliance issue (multiple no show), she was 5 months out of initial colon surgery when she returned for follow up after surgery, and the benefit of adjuvant chemotherapy was minimal at that point. So she did not receive adjuvant chemotherapy -we discussed survelliance plan, for a total of 5 years. Given the high risk of cancer recurrence, I plan to see her every 3-4 months for the first 2 years, with a repeated CT scan every 3-6 months. -She declined CT scan on last visit, but is willing to consider before the next follow-up visit. She has recovered better lately. -Lab review, and elevated liver enzymes, the CEA still pending. If her CEA is elevated, I'll consider doing a scan in the next few weeks.  2. Ovary cancer, left, pT1 cNX M0, at least stage I, mixed endometrioid and clear cell histology, grade 3, ER positive. - She underwent right right oophorectomy and  Pelvic lymph nodes biopsy for staging, which wall over negative. -She is likely need adjuvant chemotherapy, the standard therapy is carboplatin and paclitaxel for overan cancer. She is close to 3 months out of her initial ovary surgery, and she developed left lower extremity neuropathy after her recent surgery, not a great candidate for chemotherapy. I discussed with Dr. Denman George about her adjuvant adjuvant chemotherapy. Dr. Denman George feels her colon cancer is at much high risk for recurrence than ovarian cancer, and she is also quite far our from her initial ovaran cancer surgery, the benefit of chemotherapy for ovarian cancer is very limited. Due to her limited PS and pain issue, she is also not a good candidate for  chemo.   -We'll follow up as CA125  3. BRCA1 mutation carrier -I discussed her recent genetic testing results which revealed positive BRCA1 mutation. -I discussed extensively about the risk of breast cancer (57-65% by age of 68), and I recommend starting screening with mammogram. She has never had a mammogram. -She agreed with screening mammogram today. I'll order one for the next few weeks then yearly. If negative, I recommend breast MRI in 6 month then yearly, due to the high risk.  -she is not ready to discuss prophylactic surgeries (mastectomy and oophorectomy), will discuss in the future.   4. abdominal and left lower extremity neuropathy   -Likely related to her peripheral vascular disease and a neuropathy of the surgery. -Improved after vascular surgery. -She is currently following with a vascular surgeon Dr. Oneida Alar, I'll ask that appears to see if he is comfortable demented his pain medication.  5. Transaminitis -She has slightly elevated ALT and AST on today's lab, asymptomatic -I'll follow-up with her liver function in 6 weeks  4. Coping and social support, history of depression and PTSD -She has had some difficulty coping with her synchronized colon and ovarian cancer. She lives alone, does not want her children to be involved much in her cancer care. I encouraged her to discuss with our social worker here. I expressed my emotional support to her.  Plan -repeat lab in 6 weeks -Mammogram in the next few weeks -Follow up in 3 months with lab and CT CAP   All questions were answered. The patient knows to call the clinic with any problems, questions or concerns. I spent 20 minutes counseling the patient face to face. The total time spent in the appointment was 30 minutes and more than 50% was on counseling.     Truitt Merle, MD 05/11/2015

## 2015-08-10 NOTE — Telephone Encounter (Signed)
Pt called from the building requesting a case of Ensure. Per RD notes, resident has already received 3 complimentary cases of Ensure.  Offered coupons and a couple of samples.  Pt agreeable and request them to be left at the front desk.  Will leave 2 bottles of Ensure Elive and coupons for pt.

## 2015-08-11 LAB — CEA: CEA: 1.3 ng/mL (ref 0.0–5.0)

## 2015-08-11 LAB — CA 125: CA 125: 6 U/mL (ref <35)

## 2015-08-14 ENCOUNTER — Encounter: Payer: Self-pay | Admitting: Vascular Surgery

## 2015-08-16 ENCOUNTER — Encounter: Payer: Self-pay | Admitting: Vascular Surgery

## 2015-08-16 ENCOUNTER — Ambulatory Visit (INDEPENDENT_AMBULATORY_CARE_PROVIDER_SITE_OTHER): Payer: Medicare Other | Admitting: Vascular Surgery

## 2015-08-16 ENCOUNTER — Telehealth: Payer: Self-pay | Admitting: *Deleted

## 2015-08-16 VITALS — BP 132/76 | HR 61 | Temp 98.2°F | Resp 16 | Ht 67.0 in | Wt 157.0 lb

## 2015-08-16 DIAGNOSIS — I739 Peripheral vascular disease, unspecified: Secondary | ICD-10-CM

## 2015-08-16 NOTE — Telephone Encounter (Signed)
"   I need to talk with you about my pain medicines.  I saw Dr. Oneida Alar today.  Not sure what's going on but I need a CT scan, pain management or something"  Return number [phone].

## 2015-08-16 NOTE — Progress Notes (Signed)
Patient is a 54 year old female who underwent right to left femoral-femoral bypass 04/18/2015. This was done primarily for rest pain in the left foot. She continues to have some pain in the left foot but overall this is improved. She also still has some edema in the left foot. She states that the foot is 70% better than preoperatively. Her incisions are well-healed. Recent left foot x-ray showed no evidence of fracture but demineralization possibly reflex sympathetic dystrophy (RSD).  She is still requesting narcotic pain medication for her left foot. She states that her foot hurts day and night and feels cool.  Review of systems: She denies shortness of breath. She denies chest pain.  Physical exam:    Filed Vitals:   08/16/15 1052  BP: 132/76  Pulse: 61  Temp: 98.2 F (36.8 C)  TempSrc: Oral  Resp: 16  Height: '5\' 7"'$  (1.702 m)  Weight: 157 lb (71.215 kg)  SpO2: 100%    Extremities: No palpable pedal pulses well-healed groin incisions palpable femorofemoral graft pulse  Musculoskeletal: Diffuse edema left foot compared to right approximately 20% larger mildly tender on palpation diffusely no open wounds  Data: Patient had a duplex ultrasound 1 month ago of her femorofemoral bypass which suggested possible inflow narrowing of the femorofemoral. Her ABIs at that time were 0.6 bilaterally. Duplex of her lower extremities was remarkable for the fact that there was monophasic flow diffusely again suggesting an inflow occlusive disease process.  Assessment: Persistent left foot pain despite revascularization. Recent bone scan suggested reflux sympathetic dystrophy. Although her duplex suggested possible inflow stenosis of her bypass graft her ABIs are not low enough that she would be developing rest pain. This usually occurs with an ABI of 0.5 or less. I do not believe all of her current pain symptoms are related to vascular disease. We will obtain a CT Angio the abdomen and pelvis with runoff to  make sure that her femorofemoral is not at risk for occlusion. She'll return for follow-up after the CT scan. I have also referred her to a pain management clinic in light of the fact she probably has RSD. I did not prescribe any narcotic pain medication for her today as a do not believe her current pain symptoms are related to peripheral arterial disease.  Plan: See above  Ruta Hinds, MD [facility] Office: [phone] Pager: [phone]

## 2015-08-16 NOTE — Telephone Encounter (Signed)
I called her back and left a message for her to call me back.  Catherine Rogers  08/16/2015 3:50 PM

## 2015-08-17 ENCOUNTER — Ambulatory Visit (HOSPITAL_COMMUNITY): Payer: Medicare Other

## 2015-08-17 ENCOUNTER — Ambulatory Visit (HOSPITAL_COMMUNITY)
Admission: RE | Admit: 2015-08-17 | Discharge: 2015-08-17 | Disposition: A | Discharge status: Home / Self Care | Payer: Medicare Other | Source: Ambulatory Visit | Attending: Hematology | Admitting: Hematology

## 2015-08-17 DIAGNOSIS — Z1231 Encounter for screening mammogram for malignant neoplasm of breast: Secondary | ICD-10-CM | POA: Diagnosis not present

## 2015-08-17 NOTE — Addendum Note (Signed)
Addended by: Reola Calkins on: 08/17/2015 01:23 PM   Modules accepted: Orders

## 2015-08-21 ENCOUNTER — Other Ambulatory Visit: Payer: Self-pay | Admitting: Hematology

## 2015-08-21 DIAGNOSIS — C189 Malignant neoplasm of colon, unspecified: Secondary | ICD-10-CM

## 2015-08-21 MED ORDER — HYDROCODONE-ACETAMINOPHEN 7.5-325 MG PO TABS: 1.0000 | ORAL_TABLET | Freq: Three times a day (TID) | ORAL | Status: DC | PRN

## 2015-08-21 NOTE — Telephone Encounter (Addendum)
This nurse called patient for information on message left last week.    "Dr. Oneida Alar is arranging a CT Body scan October 20 th, 2016.  He says there is nothing in my veins and doesn't know what is wrong with me. I walked in for surgery but couldn't walk the same after surgery.  I can't stand very long.  Everyone says I don't walk the same.  They say I have nerve damage after surgery.  Take Lyrica.   He will not give me anything for pain stating Dr. Burr Medico originally ordered pain medicine.  He did the surgery.  I stopped asking because they go back and forth.  I take tylenol.  I have a nagging pain since surgery that feels like a ball of pressure, never goes away to my left leg, left side, and left foot.  A squeezing, trying to burst.  My legs and feet are swollen like they are going to pop.  The leg had changed colors.  They're the same color today.  Toes are dark.   I feel this is from all the surgeries in December, October and February."  Denies constipation or cardiac issues.  "LBM this morning.  Bowels move at least once daily up to three times a day."  This nurse advised she decease salt in diet.  Keep legs elevated and FF for hydration.  Apply cool compresses 10 to 15 minutes to areas that hurt and are swollen.  Lortab 7.5 mg on medication list. Ordered 07-26-2015 by this office.  Next scheduled F/U at [facility] on 11-08-2015

## 2015-08-21 NOTE — Telephone Encounter (Signed)
Addendum:  Patient asked "Is it okay for me to wear compression stockings?"

## 2015-08-21 NOTE — Telephone Encounter (Signed)
I spoke with patient, she complains that the pain and cannot walk too long. She was seen by Dr. Oneida Alar of last week, and was referred to pain management clinic. I reviewed her Vicodin when she is waiting for the appointment.  I'll reschedule her CT scan to October 20, the same day as her vascular scan, and I'll see her a few days after that.  Truitt Merle  08/21/2015

## 2015-08-22 ENCOUNTER — Telehealth: Payer: Self-pay | Admitting: *Deleted

## 2015-08-22 ENCOUNTER — Telehealth: Payer: Self-pay

## 2015-08-22 NOTE — Telephone Encounter (Signed)
Let patient know lortab was filled 9/27 and ready for pick up between 9 am and 4 pm.  Pt voiced understanding.

## 2015-08-22 NOTE — Telephone Encounter (Signed)
Spoke with [place] at [facility] [place].  They will schedule CT CAP and CT angio the same day -09/13/15 per Dr. Ernestina Penna request.  Called Dr. Oneida Alar office [phone] and they will move his appt. At 11:45am that day as patient will probably not be finished with CT's.

## 2015-09-11 ENCOUNTER — Encounter: Payer: Self-pay | Admitting: Vascular Surgery

## 2015-09-12 ENCOUNTER — Telehealth: Payer: Self-pay | Admitting: *Deleted

## 2015-09-12 ENCOUNTER — Other Ambulatory Visit: Payer: Self-pay | Admitting: *Deleted

## 2015-09-12 DIAGNOSIS — C189 Malignant neoplasm of colon, unspecified: Secondary | ICD-10-CM

## 2015-09-12 MED ORDER — HYDROCODONE-ACETAMINOPHEN 7.5-325 MG PO TABS: 1.0000 | ORAL_TABLET | Freq: Three times a day (TID) | ORAL | Status: DC | PRN

## 2015-09-12 NOTE — Telephone Encounter (Signed)
Received call from [facility] re: pt has CT angio scan ordered by Dr. Oneida Alar, vascular;  CT scan C/A/P ordered by Dr. Burr Medico.  Both scans scheduled for 09/13/15.  Pt cannot have oral contrast for both scans.  Jerrye Beavers wanted to know if Dr. Burr Medico would reschedule  C/A/P to another day.   Dr. Burr Medico notified. Spoke with Jerrye Beavers and informed her re:  Per Dr. Burr Medico,  [place] to do scans without Oral contrast.  However, pt will need  IV contrast.  Jerrye Beavers voiced understanding. Pt called and left message requesting refill of pain medications.  Spoke with pt and informed pt that script is ready for pt to pick up with injection nurse.  Pt stated she would pick up script in am 09/13/15. Pt's   Phone    [phone].

## 2015-09-12 NOTE — Telephone Encounter (Signed)
Pt called and left message wanting to know about picking up contrast for CT scans scheduled 09/13/15.  Pt also requested refill of pain medications.   Attempted to call pt back unsuccessfully.   Left message on voice mail asking pt to call collaborative nurse back today. Pt's   Phone   [phone].

## 2015-09-13 ENCOUNTER — Ambulatory Visit
Admission: RE | Admit: 2015-09-13 | Discharge: 2015-09-13 | Disposition: A | Discharge status: Home / Self Care | Payer: Medicare Other | Source: Ambulatory Visit | Attending: Vascular Surgery | Admitting: Vascular Surgery

## 2015-09-13 ENCOUNTER — Ambulatory Visit
Admission: RE | Admit: 2015-09-13 | Discharge: 2015-09-13 | Disposition: A | Discharge status: Home / Self Care | Payer: Medicare Other | Source: Ambulatory Visit | Attending: Hematology | Admitting: Hematology

## 2015-09-13 ENCOUNTER — Other Ambulatory Visit: Payer: Medicare Other

## 2015-09-13 ENCOUNTER — Encounter: Payer: Self-pay | Admitting: Vascular Surgery

## 2015-09-13 ENCOUNTER — Inpatient Hospital Stay: Admission: RE | Admit: 2015-09-13 | Payer: Medicare Other | Source: Ambulatory Visit

## 2015-09-13 ENCOUNTER — Ambulatory Visit: Payer: Medicare Other | Admitting: Vascular Surgery

## 2015-09-13 ENCOUNTER — Ambulatory Visit (INDEPENDENT_AMBULATORY_CARE_PROVIDER_SITE_OTHER): Payer: Medicare Other | Admitting: Vascular Surgery

## 2015-09-13 VITALS — BP 128/72 | HR 66 | Temp 98.0°F | Resp 14 | Ht 67.0 in | Wt 157.0 lb

## 2015-09-13 DIAGNOSIS — I739 Peripheral vascular disease, unspecified: Secondary | ICD-10-CM

## 2015-09-13 DIAGNOSIS — M79675 Pain in left toe(s): Secondary | ICD-10-CM | POA: Diagnosis not present

## 2015-09-13 DIAGNOSIS — C189 Malignant neoplasm of colon, unspecified: Secondary | ICD-10-CM

## 2015-09-13 DIAGNOSIS — Z85038 Personal history of other malignant neoplasm of large intestine: Secondary | ICD-10-CM | POA: Diagnosis not present

## 2015-09-13 MED ORDER — IOPAMIDOL (ISOVUE-370) INJECTION 76%
100.0000 mL | Freq: Once | INTRAVENOUS | Status: AC | PRN
  Administered 2015-09-13: 100 mL via INTRAVENOUS

## 2015-09-13 NOTE — Progress Notes (Signed)
Patient is a 54 year old female who underwent right to left femoral-femoral bypass 04/18/2015. This was done primarily for rest pain in the left foot. She continues to have pain in the left foot despite revascularization. She also still has some edema in the left foot. She states that the foot is 70% better than preoperatively. Her incisions are well-healed. Her claudication in the left leg has completely resolved. Recent left foot x-ray showed no evidence of fracture but demineralization possibly reflex sympathetic dystrophy (RSD).  She is trying to wean off narcotic pain medication for her left foot. She states that her foot hurts day and night and feels cool. She has also noticed changes in the shape of her foot due to changes in her gait secondary to pain.  Review of systems: She denies shortness of breath. She denies chest pain.  Physical exam:    Filed Vitals:   09/13/15 1200  BP: 128/72  Pulse: 66  Temp: 98 F (36.7 C)  TempSrc: Oral  Resp: 14  Height: '5\' 7"'$  (1.702 m)  Weight: 157 lb (71.215 kg)  SpO2: 100%    Extremities: No palpable pedal pulses well-healed groin incisions palpable femorofemoral graft pulse  Musculoskeletal: Diffuse edema left foot compared to right approximately 20% larger mildly tender on palpation diffusely no open wounds  Data: Patient had a duplex ultrasound 1 month ago of her femorofemoral bypass which suggested possible inflow narrowing of the femorofemoral. She had a CT angiogram of the abdomen and pelvis with lower extremity runoff today. This shows a widely patent femoral-femoral bypass with completely intact runoff to the left lower extremity without any narrowing  of her lower extremity arteries. She did have mild narrowing of the right superficial femoral artery.  Assessment: Persistent left foot pain despite revascularization. Recent bone scan suggested reflux sympathetic dystrophy. I do not believe all of her current pain symptoms are related to  vascular disease. She has previously had a neurologic evaluation which suggests that most of this pain probably is neurologic in origin from her previous extensive operation for ovarian cancer. She had an extensive workup by neurology in May of this year. She has had a referral to the pain clinic in May of this year August of this year and from our office a few weeks ago. She has yet to receive an appointment. I called the pain Center today at [phone]. I spoke with fair manager who will try to get the patient an appointment within the next couple of days. They are to contact the patient at [phone]. The patient will call me if she has not heard back from them and 48 hours. I did not prescribe any narcotic pain medication for her today as a do not believe her current pain symptoms are related to peripheral arterial disease.  She will follow-up with me in 6 months for repeat ABIs.  Plan: See above  Ruta Hinds, MD [facility] Office: [phone] Pager: [phone]

## 2015-09-18 ENCOUNTER — Other Ambulatory Visit: Payer: Medicare Other

## 2015-09-19 ENCOUNTER — Encounter: Payer: Self-pay | Admitting: Physical Medicine & Rehabilitation

## 2015-09-19 NOTE — Addendum Note (Signed)
Addended by: Dorthula Rue L on: 09/19/2015 03:40 PM   Modules accepted: Orders

## 2015-09-21 ENCOUNTER — Other Ambulatory Visit: Payer: Medicare Other

## 2015-10-01 ENCOUNTER — Telehealth: Payer: Self-pay | Admitting: *Deleted

## 2015-10-01 ENCOUNTER — Other Ambulatory Visit: Payer: Self-pay | Admitting: *Deleted

## 2015-10-01 DIAGNOSIS — C189 Malignant neoplasm of colon, unspecified: Secondary | ICD-10-CM

## 2015-10-01 MED ORDER — HYDROCODONE-ACETAMINOPHEN 7.5-325 MG PO TABS: 1.0000 | ORAL_TABLET | Freq: Three times a day (TID) | ORAL | Status: DC | PRN

## 2015-10-01 NOTE — Telephone Encounter (Signed)
Received vm call from pt stating that she doesn't have a pain management appt until the end of the mo (28th) & needs her hydrocodone.  Message to Dr Burr Medico & she is OK for refill.  Pt notified that script would be ready this pm.

## 2015-10-05 ENCOUNTER — Encounter: Payer: Self-pay | Admitting: Vascular Surgery

## 2015-10-08 ENCOUNTER — Telehealth: Payer: Self-pay | Admitting: Neurology

## 2015-10-08 ENCOUNTER — Other Ambulatory Visit: Payer: Self-pay | Admitting: Neurology

## 2015-10-08 NOTE — Telephone Encounter (Signed)
Patient is calling and needs a refill for Rx pregabalin 100 mg. Please send to [facility].  Thanks!

## 2015-10-08 NOTE — Telephone Encounter (Signed)
Patient states she has appt with pain clinic the end of this month.

## 2015-10-08 NOTE — Telephone Encounter (Signed)
Request was forwarded to provider for review/pproval.  I called back and spoke with the patient.  She is aware we will send Rx to the pharmacy once approved, and will call her back if there are any issues.  Says her fiancee was already going to the pharmacy to pick up meds she is getting from another MD, so they can go back later, no problem.  She will be seen at the patient clinic at the end of this month.

## 2015-10-08 NOTE — Telephone Encounter (Signed)
Patient called regarding pregabalin (LYRICA) 100 MG capsule. States the pharmacy has not received prescription. Patient's Celesta Gentile is waiting at pharmacy.

## 2015-10-09 ENCOUNTER — Telehealth: Payer: Self-pay | Admitting: *Deleted

## 2015-10-09 NOTE — Telephone Encounter (Signed)
Lyrica rx faxed and confirmed to pharmacy at [phone].

## 2015-10-10 NOTE — Telephone Encounter (Signed)
It appear Sharyn Lull kindly faxed this Rx to the pharmacy yesterday.  I called the pharmacy and they were not able to locate the Rx.  Prescription has been re-faxed, receipt confirmed by pharmacy.  I called the patient back to relay this info.  She expressed understanding, and will follow up with the pharmacy.  She will call us back if anything further is needed.

## 2015-10-10 NOTE — Telephone Encounter (Addendum)
Pt called and states the pharmacy is telling her this office has denied her rx for lyrica. Pt has been told she would be able to get rx and is confused. Please call and advise [phone]

## 2015-10-15 ENCOUNTER — Telehealth: Payer: Self-pay | Admitting: *Deleted

## 2015-10-15 NOTE — Telephone Encounter (Signed)
"  I have an appointment 10-22-2015 but I don't know where to go or who I am to see." This nurse provided appointments for pain rehab clinic.  Time for lab: 11:15, arrive at 11:00 am to register at [facility] followed by visit with Dr. Alysia Penna [phone].  No further questions.  Denies receipt of new patient packet at this time.

## 2015-10-22 ENCOUNTER — Encounter: Payer: Self-pay | Admitting: Physical Medicine & Rehabilitation

## 2015-10-22 ENCOUNTER — Ambulatory Visit (HOSPITAL_BASED_OUTPATIENT_CLINIC_OR_DEPARTMENT_OTHER): Payer: Medicare Other | Admitting: Physical Medicine & Rehabilitation

## 2015-10-22 ENCOUNTER — Encounter: Payer: Medicare Other | Attending: Physical Medicine & Rehabilitation

## 2015-10-22 VITALS — BP 149/96 | HR 83 | Resp 14

## 2015-10-22 DIAGNOSIS — R51 Headache: Secondary | ICD-10-CM | POA: Insufficient documentation

## 2015-10-22 DIAGNOSIS — Z79899 Other long term (current) drug therapy: Secondary | ICD-10-CM | POA: Insufficient documentation

## 2015-10-22 DIAGNOSIS — Z7952 Long term (current) use of systemic steroids: Secondary | ICD-10-CM | POA: Insufficient documentation

## 2015-10-22 DIAGNOSIS — C189 Malignant neoplasm of colon, unspecified: Secondary | ICD-10-CM | POA: Insufficient documentation

## 2015-10-22 DIAGNOSIS — F4310 Post-traumatic stress disorder, unspecified: Secondary | ICD-10-CM | POA: Insufficient documentation

## 2015-10-22 DIAGNOSIS — I10 Essential (primary) hypertension: Secondary | ICD-10-CM | POA: Insufficient documentation

## 2015-10-22 DIAGNOSIS — G90522 Complex regional pain syndrome I of left lower limb: Secondary | ICD-10-CM | POA: Diagnosis not present

## 2015-10-22 DIAGNOSIS — D649 Anemia, unspecified: Secondary | ICD-10-CM | POA: Diagnosis not present

## 2015-10-22 DIAGNOSIS — C7960 Secondary malignant neoplasm of unspecified ovary: Secondary | ICD-10-CM | POA: Diagnosis not present

## 2015-10-22 DIAGNOSIS — K219 Gastro-esophageal reflux disease without esophagitis: Secondary | ICD-10-CM | POA: Diagnosis not present

## 2015-10-22 DIAGNOSIS — M1990 Unspecified osteoarthritis, unspecified site: Secondary | ICD-10-CM | POA: Diagnosis not present

## 2015-10-22 DIAGNOSIS — G5640 Causalgia of unspecified upper limb: Secondary | ICD-10-CM | POA: Insufficient documentation

## 2015-10-22 MED ORDER — TRAMADOL HCL 50 MG PO TABS: 50.0000 mg | ORAL_TABLET | Freq: Three times a day (TID) | ORAL | Status: DC | PRN

## 2015-10-22 MED ORDER — METHYLPREDNISOLONE 4 MG PO TBPK: ORAL_TABLET | ORAL | Status: DC

## 2015-10-22 NOTE — Progress Notes (Signed)
Subjective:    Patient ID: Catherine Rogers, female    DOB: [birth year], 54 y.o.   MRN: [id number] CC anterior leg pain as well as dorsum of the foot pain HPI 54 year old female with history of peripheral vascular disease or he achieves undergone multiple surgical procedures for revascularization. This included femorofemoral bypass earlier this year. Ever since her third surgery she complained of nerve damage in the left lower extremity. Patient was evaluated by neurology. EMG was performed in April 2016. Main positive findings included There was increased insertional activity, 2-3 plus spontaneous activity at left tibialis anterior, tibialis posterior, enlarged complex motor unit potential with decreased recruitment patterns. No paraspinal denervation was noted. In addition there was No abnormalities right Lower extremity. 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 Differential diagnosis included degenerative arthritis, RSD   Pain Inventory Average Pain 6 Pain Right Now 3 My pain is tingling and aching  In the last 24 hours, has pain interfered with the following? General activity 4 Relation with others 7 Enjoyment of life 5 What TIME of day is your pain at its worst? daytime, night  Sleep (in general) Fair  Pain is worse with: walking, bending, standing and some activites Pain improves with: medication Relief from Meds: 8  Mobility walk without assistance how many minutes can you walk? 2 ability to climb steps?  yes do you drive?  yes Do you have any goals in this area?  yes  Function disabled: date disabled . I need assistance with the following:  household duties and shopping  Neuro/Psych No problems in this area  Prior Studies new visit  Physicians involved in your care new visit   Family History  Problem Relation Age  of Onset  . Brain cancer Sister 29  . Lung cancer Brother 60  . Heart attack Mother   . Heart attack Father     60s  . Heart attack Maternal Uncle   . Diabetes Maternal Uncle   . Ovarian cancer Paternal Aunt     3 paternal aunts with ovarian cancer  . Diabetes Paternal Uncle   . Heart attack Brother    Social History   Social History  . Marital Status: Widowed    Spouse Name: N/A  . Number of Children: 6  . Years of Education: 13   Occupational History  . Disability    Social History Main Topics  . Smoking status: Never Smoker   . Smokeless tobacco: Never Used     Comment: pack per month of cigars = 5  . Alcohol Use: No     Comment: socially   . Drug Use: Yes    Special: Marijuana     Comment: 04/17/15- 2 weeks ago  . Sexual Activity: No   Other Topics Concern  . None   Social History Narrative   Born and raised in [place], [place]. Currently resides in a house by herself. No pets. Fun: Read, shoot pool.    Denies religious beliefs that would effect health care.    Lives at home alone.   Right-handed.   No caffeine use.   Past Surgical History  Procedure Laterality Date  . Esophagogastroduodenoscopy N/A 09/17/2014    Procedure: ESOPHAGOGASTRODUODENOSCOPY (EGD);  Surgeon: Jeryl Columbia, MD;  Location: [facility] ENDOSCOPY;  Service: Endoscopy;  Laterality: N/A;  . Abdominal hysterectomy    . Tubal ligation    .  Partial colectomy Right 09/18/2014    Procedure: RIGHT HEMI COLECTOMY;  Surgeon: Doreen Salvage, MD;  Location: [facility];  Service: General;  Laterality: Right;  . Ileostomy Right 09/18/2014    Procedure: ILEOCOLOSTOMY;  Surgeon: Doreen Salvage, MD;  Location: [facility];  Service: General;  Laterality: Right;  . Laparotomy N/A 11/01/2014    Procedure: EXPLORATORY LAPAROTOMY;  Surgeon: Doreen Salvage, MD;  Location: [facility];  Service: General;  Laterality: N/A;  . Mass excision  11/01/2014    Procedure: EXCISION PELVIC MASS;  Surgeon: Doreen Salvage, MD;  Location: [facility];  Service: General;;    . Robotic assisted total hysterectomy with bilateral salpingo oopherectomy Right 12/26/2014    Procedure: EXPLORATORY LAPAROSCOPY  RIGHT SALPINGO OOPHORECTOMY OMENTECTOMY LYMPHADECTOMY ;  Surgeon: Everitt Amber, MD;  Location: [facility];  Service: Gynecology;  Laterality: Right;  . Laparotomy N/A 12/26/2014    Procedure:  LAPAROTOMY ;  Surgeon: Everitt Amber, MD;  Location: [facility];  Service: Gynecology;  Laterality: N/A;  . Oophorectomy  2016  . Colonoscopy    . Femoral-femoral bypass graft Bilateral 04/18/2015    Procedure:  RIGHT COMMON FEMORAL TO LEFT COMMON  FEMORAL  ARTERY BYPASS WITH  8 MM HEMASHIELD GRAFT,RIGHT COMMON SUPERFICIAL FEMORAL ARTERY ENDARTERECTOMY;  Surgeon: Elam Dutch, MD;  Location: [facility];  Service: Vascular;  Laterality: Bilateral;   Past Medical History  Diagnosis Date  . Hypertension   . Ectopic pregnancy   . Head injury, closed, with brief LOC (May)     had a cut- as a child  . Asthma   . Anxiety   . Depression   . PTSD (post-traumatic stress disorder)   . Arthritis   . Pelvic cyst   . Complication of anesthesia     woke and saw long tube coming out of mouth . sat up then went back to sleep  . PONV (postoperative nausea and vomiting)   . Headache   . Cancer ([place])     colon and ovarian  . Anemia   . History of blood transfusion     2005 approximately   . Family history of ovarian cancer   . Family history of adverse reaction to anesthesia     sister on dialysis- hypotension   . GERD (gastroesophageal reflux disease)     occasional  . PAD (peripheral artery disease) ([place]) 04/18/2015   BP 149/96 mmHg  Pulse 83  Resp 14  SpO2 99%  Opioid Risk Score:   Fall Risk Score:  `1  Depression screen PHQ 2/9  Depression screen PHQ 2/9 10/22/2015  Decreased Interest 3  Down, Depressed, Hopeless 0  PHQ - 2 Score 3  Altered sleeping 1  Tired, decreased energy 1  Change in appetite 0  Feeling bad or failure about yourself  0  Trouble concentrating 0  Moving  slowly or fidgety/restless 0  Suicidal thoughts 0  PHQ-9 Score 5     Review of Systems  Gastrointestinal: Positive for nausea.  All other systems reviewed and are negative.      Objective:   Physical Exam  Constitutional: She is oriented to person, place, and time. She appears well-developed and well-nourished.  HENT:  Head: Normocephalic and atraumatic.  Eyes: Conjunctivae and EOM are normal. Pupils are equal, round, and reactive to light.  Neck: Normal range of motion.  Cardiovascular: Normal rate, regular rhythm and normal heart sounds.   Pulmonary/Chest: Effort normal and breath sounds normal.  Abdominal: Bowel sounds are normal. There is tenderness.  Tenderness mainly in the left lower quadrant. States that it has been there since her abdominal surgery for ovarian carcinoma.  Genitourinary:  Left inguinal scarring from prior surgery, tender to palpation  Musculoskeletal:  Lumbar spine without tenderness palpation there is no evidence of scoliosis no evidence of thoracic scoliosis or tenderness. She has good lumbar and thoracic range of motion  Patient has no left knee joint or ankle joint swelling noted no tone joint swelling. There is atrophy in the foot intrinsic muscles of the left side  Neurological: She is alert and oriented to person, place, and time. She displays normal reflexes. No cranial nerve deficit. She exhibits normal muscle tone. Coordination normal.  Psychiatric: She has a normal mood and affect.  Nursing note and vitals reviewed.   There is evidence of skin color changes darker left foot compared to the right side. There is pain with joint range of motion at the MTPs on the left side compared to the right side also allodynia with light touch. In addition there is evidence of edema in the foot intrinsic muscles on the left side.  Motor strength is 4/5 in the left hip flexor and knee extensor ankle dorsal flexor and plantar flexor SS 5 in the right hip flexor  and knee extensor and ankle dorsi flexion plantar flexor      Assessment & Plan:   1. Complex regional pain syndrome type I left lower extremity. She meets the diagnostic criteria listed below. She has an EMG demonstrating some evidence of denervation in the left leg however her distribution of pain is outside of a single peripheral nerve distribution. She is on a high-dose of Lyrica already. She's had some relief with physical therapy. Her opioids risk is high  So I do not recommend a C3 or C2 medication Has not tried any lumbar sympathetic injections. Discussed that this may help her pain as well as her temperature asymmetry, Swelling and skin color changes, Made referral to Dr. Carroll Kinds  The meantime we'll do Medrol Dosepak as well as tramadol 50 mg 3 times a day, recommend continue Lyrica    2012 International Association for the Study of Pain (IASP) diagnostic criteria (also called Budapest criteria or Harden/Bruehl criteria)  continuing pain disproportionate to any inciting event YES  must report at least 1 symptom in ? 3 of the following categories  sensory - allodynia and/or hyperesthesia YES   vasomotor - temperature asymmetry, skin color changes, and/or skin color asymmetry  sudomotor/edema - edema, sweating changes, and/or sweating asymmetry  motor/trophic - decreased range of motion, motor dysfunction (weakness, tremor, or dystonia), and/or trophic changes (in hair, nails, or skin)  must have at least 1 sign at time of evaluation in ? 2 of the following categories  sensory - allodynia (to light touch, deep somatic pressure, or joint movement) and/or hyperalgesia (to pinprick)  vasomotor - temperature asymmetry (> 1 degree C [1.8 degrees F]), skin color changes, and/or skin color asymmetry  sudomotor/edema - evidence of edema, sweating changes, and/or sweating asymmetry  motor/trophic - evidence of decreased range of motion, motor dysfunction (weakness, tremor, or  dystonia), and/or trophic changes (in hair, nails, or skin)  no other diagnosis can better explain signs or symptoms

## 2015-10-22 NOTE — Progress Notes (Deleted)
   Subjective:    Patient ID: Catherine Rogers, female    DOB: 12/16/1960, 54 y.o.   MRN: [id number]  HPI    Review of Systems     Objective:   Physical Exam        Assessment & Plan:

## 2015-10-25 ENCOUNTER — Telehealth: Payer: Self-pay | Admitting: *Deleted

## 2015-10-25 ENCOUNTER — Telehealth: Payer: Self-pay

## 2015-10-25 NOTE — Telephone Encounter (Signed)
Pt called, states that she would like to discontinue her Tramadol and Methyprednisolone. She states that both medications are not helping her and she does not want to take anymore medication. She is currently taking Hydrocodone and Lyrica from Dr. Krista Blue and is happy with those medications and their results. She would like your permission to stop those two medications. Please advise.

## 2015-10-25 NOTE — Telephone Encounter (Signed)
Pt called and left message requesting refill of Hydrocodone.  Spoke with pt and was informed that pt had called her pain clinic and left message about pain meds not working.  Pt is awating return call from Dr. Letta Pate'  Office - pain clinic.    Pt also stated she has Left foot swelling with dark discoloration - Dr. Letta Pate aware.    Pt has appt with her PCP Dr. Florene Route in am  10/26/15.   Pt stated she called the cancer center before she had appt with her PCP.   Pt understood to have pain managed by the pain clinic;  Pt will also notify her PCP of discolored swollen left foot. Pt's  Phone    [phone].

## 2015-10-25 NOTE — Telephone Encounter (Signed)
She may discontinue the tramadol that I would recommend her continuing the Medrol as well as following up with Dr. Isaiah Blakes

## 2015-10-26 ENCOUNTER — Encounter: Payer: Self-pay | Admitting: Family

## 2015-10-26 ENCOUNTER — Ambulatory Visit (INDEPENDENT_AMBULATORY_CARE_PROVIDER_SITE_OTHER): Payer: Medicare Other | Admitting: Family

## 2015-10-26 VITALS — BP 120/72 | HR 67 | Temp 98.4°F | Wt 157.0 lb

## 2015-10-26 DIAGNOSIS — G90522 Complex regional pain syndrome I of left lower limb: Secondary | ICD-10-CM

## 2015-10-26 NOTE — Progress Notes (Signed)
Pre visit review using our clinic review tool, if applicable. No additional management support is needed unless otherwise documented below in the visit note. 

## 2015-10-26 NOTE — Progress Notes (Addendum)
Subjective:    Patient ID: Catherine Rogers, female    DOB: 02-Oct-1961, 54 y.o.   MRN: [id number]  Chief Complaint  Patient presents with  . Leg Pain    HPI:  Catherine Rogers is a 54 y.o. female who  has a past medical history of Hypertension; Ectopic pregnancy; Head injury, closed, with brief LOC ([place]); Asthma; Anxiety; Depression; PTSD (post-traumatic stress disorder); Arthritis; Pelvic cyst; Complication of anesthesia; PONV (postoperative nausea and vomiting); Headache; Cancer ([address]); Anemia; History of blood transfusion; Family history of ovarian cancer; Family history of adverse reaction to anesthesia; GERD (gastroesophageal reflux disease); and PAD (peripheral artery disease) ([place]) (04/18/2015). and presents today for an office visit.    Presents today with the associated symptom of left foot pain and discoloration. She has extensive history since her last visit. Found to have a mass in her cecum consistent with colon cancer with ilieocecal intussception. 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 Following her initial pelvic surgery she was noted to have discomfort in her left leg that progressively worsened. In May she was noted to have an ankle arm index of 0.13 on the left and CT angiogram revealed occlusion of the left iliac artery. She underwent a right common femoral to left common femoral artery bypass with artery bypass graft and right common superficial femoral artery endarterectomy. Since the revascularization there has been concern for the development of RSD and she has been referred to pain management that she recently started this week. Pain management assessment found complex regional pain syndrome Type 1. Her pain was noted to be outside of a single peripheral nerve distribution. There has been mild relief from physical therapy. She was most recently prescribed a Medrol Dosepak and Tramadol for the pain and had previously been  maintained on hydrocodone. She is also being referred to anestheisology for potential lumbar sympathetic injections.   Notes today that the Tramadol does not adequately control her pain and does not believe the Medrol Dosepak is helping. She has contacted both Dr. Ernestina Penna office and Dr. Letta Pate office regarding her pain difficulties.  Allergies  Allergen Reactions  . Other Hives and Itching    Highly acidic foods "make me itch and break out" oranges, tomatoes  . Demerol [Meperidine] Nausea And Vomiting  . Morphine And Related Nausea And Vomiting  . Oxycodone Itching  . Penicillins Other (See Comments)    Childhood allergy  . Tylenol [Acetaminophen] Nausea And Vomiting  . Ibuprofen Nausea And Vomiting     Current Outpatient Prescriptions on File Prior to Visit  Medication Sig Dispense Refill  . docusate sodium 100 MG CAPS Take 100 mg by mouth 2 (two) times daily as needed for mild constipation. 10 capsule 0  . HYDROcodone-acetaminophen (LORTAB) 7.5-325 MG tablet Take 1 tablet by mouth every 8 (eight) hours as needed for moderate pain. 60 tablet 0  . LYRICA 100 MG capsule take 2 capsules by mouth three times a day 180 capsule 0  . methylPREDNISolone (MEDROL DOSEPAK) 4 MG TBPK tablet follow package directions 21 tablet 0   No current facility-administered medications on file prior to visit.     Past Surgical History  Procedure Laterality Date  . Esophagogastroduodenoscopy N/A 09/17/2014    Procedure: ESOPHAGOGASTRODUODENOSCOPY (EGD);  Surgeon: Jeryl Columbia, MD;  Location: [facility] ENDOSCOPY;  Service: Endoscopy;  Laterality: N/A;  . Abdominal hysterectomy    . Tubal ligation    . Partial colectomy  Right 09/18/2014    Procedure: RIGHT HEMI COLECTOMY;  Surgeon: Doreen Salvage, MD;  Location: [place];  Service: General;  Laterality: Right;  . Ileostomy Right 09/18/2014    Procedure: ILEOCOLOSTOMY;  Surgeon: Doreen Salvage, MD;  Location: [place];  Service: General;  Laterality: Right;  . Laparotomy N/A  11/01/2014    Procedure: EXPLORATORY LAPAROTOMY;  Surgeon: Doreen Salvage, MD;  Location: [place];  Service: General;  Laterality: N/A;  . Mass excision  11/01/2014    Procedure: EXCISION PELVIC MASS;  Surgeon: Doreen Salvage, MD;  Location: [place];  Service: General;;  . Robotic assisted total hysterectomy with bilateral salpingo oopherectomy Right 12/26/2014    Procedure: EXPLORATORY LAPAROSCOPY  RIGHT SALPINGO OOPHORECTOMY OMENTECTOMY LYMPHADECTOMY ;  Surgeon: Everitt Amber, MD;  Location: [facility];  Service: Gynecology;  Laterality: Right;  . Laparotomy N/A 12/26/2014    Procedure:  LAPAROTOMY ;  Surgeon: Everitt Amber, MD;  Location: [facility];  Service: Gynecology;  Laterality: N/A;  . Oophorectomy  2016  . Colonoscopy    . Femoral-femoral bypass graft Bilateral 04/18/2015    Procedure:  RIGHT COMMON FEMORAL TO LEFT COMMON  FEMORAL  ARTERY BYPASS WITH  8 MM HEMASHIELD GRAFT,RIGHT COMMON SUPERFICIAL FEMORAL ARTERY ENDARTERECTOMY;  Surgeon: Elam Dutch, MD;  Location: [facility];  Service: Vascular;  Laterality: Bilateral;    Review of Systems  Constitutional: Negative for fever and chills.  Musculoskeletal:       Positive for left foot pain  Skin: Positive for color change.      Objective:    BP 120/72 mmHg  Pulse 67  Temp(Src) 98.4 F (36.9 C)  Wt 157 lb (71.215 kg)  SpO2 98% Nursing note and vital signs reviewed.  Physical Exam  Constitutional: She is oriented to person, place, and time. She appears well-developed and well-nourished. No distress.  Cardiovascular: Normal rate, regular rhythm, normal heart sounds and intact distal pulses.   Pulmonary/Chest: Effort normal and breath sounds normal.  Musculoskeletal:  Left foot - There is mild discoloration of the distal foot with no significant edema or deformity noted. Able to ambulate. Distal pulses are not palpable.   Neurological: She is alert and oriented to person, place, and time.  Skin: Skin is warm and dry.  Darkening of left foot compared to the  contralateral side.   Psychiatric: She has a normal mood and affect. Her behavior is normal. Judgment and thought content normal.       Assessment & Plan:   Problem List Items Addressed This Visit      Nervous and Auditory   RSD lower limb - Primary    Foot pain with recent initiation of treatment for RSD through pain management. Patient expressed frustration with process and wishes just to be able to walk normal again. Emphasized the importance of patience with treatment regimen as she has just begun treatment with pain management. Recommend follow up communications with Dr. Dianna Limbo office regarding pain control and follow up with Dr. Isaiah Blakes as he has indicated. Explained that I would not provide additional pain medications as she is currently  working with pain management clinic. Also recommend follow up with Dr. Oneida Alar to ensure circulation of lower extremity remains intact as she fears the color change is worsening.   A total of 30 minutes of face-to-face time was spent with the patient and over half the time was spent regarding pain management and approaches to pain management. It was emphasized that there is no easy fix and she  is not being experimented on.

## 2015-10-26 NOTE — Patient Instructions (Addendum)
Please follow up with Dr. Oneida Alar and Dr. Letta Pate regarding your foot and pain management.   I will contact Dr. Letta Pate to express your pain.   Please continue to take the Medrol dosepak and the Lyrica.

## 2015-10-26 NOTE — Assessment & Plan Note (Addendum)
Foot pain with recent initiation of treatment for RSD through pain management. Patient expressed frustration with process and wishes just to be able to walk normal again. Emphasized the importance of patience with treatment regimen as she has just begun treatment with pain management. Recommend follow up communications with Dr. Dianna Limbo office regarding pain control and follow up with Dr. Isaiah Blakes as he has indicated. Explained that I would not provide additional pain medications as she is currently  working with pain management clinic. Also recommend follow up with Dr. Oneida Alar to ensure circulation of lower extremity remains intact as she fears the color change is worsening.   A total of 30 minutes of face-to-face time was spent with the patient and over half the time was spent regarding pain management and approaches to pain management. It was emphasized that there is no easy fix and she is not being experimented on.

## 2015-10-26 NOTE — Telephone Encounter (Signed)
Left message for pt to advise that she can stop Tramadol, but should stay on Medrol.

## 2015-11-01 ENCOUNTER — Other Ambulatory Visit (HOSPITAL_BASED_OUTPATIENT_CLINIC_OR_DEPARTMENT_OTHER): Payer: Medicare Other

## 2015-11-01 DIAGNOSIS — C562 Malignant neoplasm of left ovary: Secondary | ICD-10-CM | POA: Diagnosis not present

## 2015-11-01 DIAGNOSIS — C189 Malignant neoplasm of colon, unspecified: Secondary | ICD-10-CM

## 2015-11-01 DIAGNOSIS — C569 Malignant neoplasm of unspecified ovary: Secondary | ICD-10-CM | POA: Diagnosis not present

## 2015-11-01 LAB — COMPREHENSIVE METABOLIC PANEL
ALBUMIN: 3.7 g/dL (ref 3.5–5.0)
ALK PHOS: 88 U/L (ref 40–150)
ALT: 26 U/L (ref 0–55)
ANION GAP: 10 meq/L (ref 3–11)
AST: 26 U/L (ref 5–34)
BILIRUBIN TOTAL: 0.51 mg/dL (ref 0.20–1.20)
BUN: 21 mg/dL (ref 7.0–26.0)
CALCIUM: 9.2 mg/dL (ref 8.4–10.4)
CO2: 25 mEq/L (ref 22–29)
CREATININE: 0.9 mg/dL (ref 0.6–1.1)
Chloride: 104 mEq/L (ref 98–109)
EGFR: 81 mL/min/{1.73_m2} — AB (ref >90)
Glucose: 71 mg/dl (ref 70–140)
Potassium: 4.2 mEq/L (ref 3.5–5.1)
Sodium: 140 mEq/L (ref 136–145)
TOTAL PROTEIN: 7.2 g/dL (ref 6.4–8.3)

## 2015-11-01 LAB — CBC WITH DIFFERENTIAL/PLATELET
BASO%: 0.3 % (ref 0.0–2.0)
Basophils Absolute: 0 10*3/uL (ref 0.0–0.1)
EOS ABS: 0.1 10*3/uL (ref 0.0–0.5)
EOS%: 0.9 % (ref 0.0–7.0)
HEMATOCRIT: 39.3 % (ref 34.8–46.6)
HEMOGLOBIN: 13.9 g/dL (ref 11.6–15.9)
LYMPH#: 4 10*3/uL — AB (ref 0.9–3.3)
LYMPH%: 51.1 % — AB (ref 14.0–49.7)
MCH: 36.6 pg — ABNORMAL HIGH (ref 25.1–34.0)
MCHC: 35.4 g/dL (ref 31.5–36.0)
MCV: 103.4 fL — AB (ref 79.5–101.0)
MONO#: 0.7 10*3/uL (ref 0.1–0.9)
MONO%: 9 % (ref 0.0–14.0)
NEUT%: 38.7 % (ref 38.4–76.8)
NEUTROS ABS: 3.1 10*3/uL (ref 1.5–6.5)
PLATELETS: 131 10*3/uL — AB (ref 145–400)
RBC: 3.8 10*6/uL (ref 3.70–5.45)
RDW: 13.2 % (ref 11.2–14.5)
WBC: 7.9 10*3/uL (ref 3.9–10.3)

## 2015-11-02 LAB — CEA: CEA: 1.2 ng/mL (ref 0.0–5.0)

## 2015-11-02 LAB — CA 125: CA 125: 5 U/mL (ref <35)

## 2015-11-06 ENCOUNTER — Telehealth: Payer: Self-pay | Admitting: *Deleted

## 2015-11-06 NOTE — Telephone Encounter (Signed)
Call in Cymbalta 30 mg per day #30 one refill  Needs to follow-up with Dr. Isaiah Blakes for sympathetic injection

## 2015-11-06 NOTE — Telephone Encounter (Signed)
Catherine Rogers called and her medication is not working.  She is in the process of the medrol dosepak and has the tramadol.  She says her foot is still hurting her.  She wi wanting to know what else she can do if he is not going to give her the lortab.

## 2015-11-07 MED ORDER — DULOXETINE HCL 30 MG PO CPEP: 30.0000 mg | ORAL_CAPSULE | Freq: Every day | ORAL | Status: DC

## 2015-11-07 NOTE — Telephone Encounter (Signed)
Pt advised to contact PCP for referral.

## 2015-11-07 NOTE — Telephone Encounter (Signed)
Spoke with Catherine Rogers. Catherine Rogers. Advised that medication has been sent in. Catherine Rogers states that she no longer wants to receive injections from Dr. Isaiah Blakes and she states that she would like for you to make a referral to a podiatrist since it is her foot that is hurting her.

## 2015-11-07 NOTE — Telephone Encounter (Signed)
Do not think this is a podiatry problem. She would need to get a podiatry referral from her primary physician since I don't agree with podiatry referral

## 2015-11-08 ENCOUNTER — Ambulatory Visit (HOSPITAL_BASED_OUTPATIENT_CLINIC_OR_DEPARTMENT_OTHER): Payer: Medicare Other | Admitting: Hematology

## 2015-11-08 ENCOUNTER — Encounter: Payer: Self-pay | Admitting: *Deleted

## 2015-11-08 ENCOUNTER — Encounter: Payer: Self-pay | Admitting: Hematology

## 2015-11-08 ENCOUNTER — Telehealth: Payer: Self-pay | Admitting: Hematology

## 2015-11-08 VITALS — BP 188/96 | HR 85 | Temp 98.4°F | Resp 18 | Ht 67.0 in | Wt 155.2 lb

## 2015-11-08 DIAGNOSIS — C182 Malignant neoplasm of ascending colon: Secondary | ICD-10-CM

## 2015-11-08 DIAGNOSIS — Z1501 Genetic susceptibility to malignant neoplasm of breast: Secondary | ICD-10-CM

## 2015-11-08 DIAGNOSIS — G6289 Other specified polyneuropathies: Secondary | ICD-10-CM

## 2015-11-08 DIAGNOSIS — I739 Peripheral vascular disease, unspecified: Secondary | ICD-10-CM | POA: Diagnosis not present

## 2015-11-08 DIAGNOSIS — C562 Malignant neoplasm of left ovary: Secondary | ICD-10-CM

## 2015-11-08 DIAGNOSIS — F419 Anxiety disorder, unspecified: Secondary | ICD-10-CM

## 2015-11-08 DIAGNOSIS — Z1509 Genetic susceptibility to other malignant neoplasm: Secondary | ICD-10-CM

## 2015-11-08 NOTE — Telephone Encounter (Signed)
per pof to sch pt appt-gave pt copy of avs °

## 2015-11-08 NOTE — Progress Notes (Signed)
[facility]  [phone] [phone]  Clinic follow Up Note   Patient Care Team: Golden Circle, FNP as PCP - General (Family Medicine) Truitt Merle, MD as Consulting Physician (Hematology) Judeth Horn, MD as Consulting Physician (General Surgery) Nat Math, MD as Referring Physician (Obstetrics and Gynecology) Everitt Amber, MD as Consulting Physician (Obstetrics and Gynecology) Elam Dutch, MD as Consulting Physician (Vascular Surgery) 11/08/2015   CHIEF COMPLAINTS/PURPOSE OF CONSULTATION:  1. Colon cancer, pT3N1M0, stage III 2. Ovarian cancer, pT1cNxM0, stage I  3. BRCA1 mutation (+)    Adenocarcinoma of colon ([place])   09/16/2014 Imaging CT abd/pel:  a 3.8cm cecal mass with associated ileocecal intussusception, and a 9.1cm solid and cystic mass in the left pelvis,   09/18/2014 Initial Diagnosis Adenocarcinoma of right colon   09/18/2014 Pathologic Stage pT3pN1Mx, tumor extends into pericolonic soft tissue and is less than 1m from the serosal surface, LVI 8-), PNI (-), all 23 node negative, one soft tissue tumor deposit, surgical margins negative.    09/18/2014 Surgery right hemicolectomy and terminal ileoectomy    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   10/09/2014 Imaging CT of the abdomen and pelvis showed a 9.1 cm solid and cystic mass in the left pelvis concerning for malignancy. This was discovered during her workup for colon cancer.   11/01/2014 Initial Diagnosis 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   11/01/2014 Surgery Salpingo-oophorectomy.   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 Tumor size 3.7 cm, no lymph nodes removed. T1cNx. Tumor cells are positive for cytokeratin 7 and estrogen receptor.     HISTORY OF PRESENTING ILLNESS:  [name].[id number]. female is here because of recently diagnosed colon cancer.   She presented with abdominal  pain, nausea and vomiting, hemoptoses in Oct 2015.  [facility] She [facility] CT scan that revealed a 3.8cm cecal mass with associated ileocecal intussusception, and a 9.1cm solid and cystic mass in the left pelvis, likely a peritoneal met with ascites.  EGD was negative. She had exploratory laparoscopy on 09/18/2014 by Dr. WHulen Skains and underwent right hemicolectomy and ileocecectomy. The pathology reviewed also related an invasive adenocarcinoma at the terminal ileum and cecum. She was discharged home on 09/22/2014.  She underwent a second surgery for left pelvis cystic mass resection on 11/01/2014, surgical path showed ovarian cancer.  CURRENT TREATMENT:   Surveillance  INTERIM HISTORY: TTaleeyahreturns for follow-up. I saw her 3 month ago.  She has been seeing a pain specialist in the past few months, hydrocodone was weaned off, she tried tramadol, which was changed to Cymbalta recently.  She has been doing poorly with the pain medication transition. She complains severe left leg pain, insomnia,  Depression,  And broke into tears during her office visit. She states she does not want go back to hydrocortisone,  And does not want go to back to the pain clinic. She denies any other new pain, nausea, change of her bowel habits or other symptoms. She has gained about 30 lbs in the past 6 months.   MEDICAL HISTORY:  Past Medical History  Diagnosis Date  . Hypertension   . Ectopic pregnancy   . Head injury, closed, with brief LOC ([place]     had a cut- as a child  . Asthma   . Anxiety   .  Depression   . PTSD (post-traumatic stress disorder)   . Arthritis   . Pelvic cyst   . Complication of anesthesia     woke and saw long tube coming out of mouth . sat up then went back to sleep  . PONV (postoperative nausea and vomiting)   . Headache   . Cancer ([place])     colon and ovarian  .  Anemia   . History of blood transfusion     2005 approximately   . Family history of ovarian cancer   . Family history of adverse reaction to anesthesia     sister on dialysis- hypotension   . GERD (gastroesophageal reflux disease)     occasional  . PAD (peripheral artery disease) ([facility]) 04/18/2015  PTSD   SURGICAL HISTORY: Past Surgical History  Procedure Laterality Date  . Esophagogastroduodenoscopy N/A 09/17/2014    Procedure: ESOPHAGOGASTRODUODENOSCOPY (EGD);  Surgeon: Jeryl Columbia, MD;  Location: [facility] ENDOSCOPY;  Service: Endoscopy;  Laterality: N/A;  . Abdominal hysterectomy    . Tubal ligation    . Partial colectomy Right 09/18/2014    Procedure: RIGHT HEMI COLECTOMY;  Surgeon: Doreen Salvage, MD;  Location: [place];  Service: General;  Laterality: Right;  . Ileostomy Right 09/18/2014    Procedure: ILEOCOLOSTOMY;  Surgeon: Doreen Salvage, MD;  Location: [place];  Service: General;  Laterality: Right;  . Laparotomy N/A 11/01/2014    Procedure: EXPLORATORY LAPAROTOMY;  Surgeon: Doreen Salvage, MD;  Location: [place];  Service: General;  Laterality: N/A;  . Mass excision  11/01/2014    Procedure: EXCISION PELVIC MASS;  Surgeon: Doreen Salvage, MD;  Location: [place];  Service: General;;  . Robotic assisted total hysterectomy with bilateral salpingo oopherectomy Right 12/26/2014    Procedure: EXPLORATORY LAPAROSCOPY  RIGHT SALPINGO OOPHORECTOMY OMENTECTOMY LYMPHADECTOMY ;  Surgeon: Everitt Amber, MD;  Location: [facility];  Service: Gynecology;  Laterality: Right;  . Laparotomy N/A 12/26/2014    Procedure:  LAPAROTOMY ;  Surgeon: Everitt Amber, MD;  Location: [facility];  Service: Gynecology;  Laterality: N/A;  . Oophorectomy  2016  . Colonoscopy    . Femoral-femoral bypass graft Bilateral 04/18/2015    Procedure:  RIGHT COMMON FEMORAL TO LEFT COMMON  FEMORAL  ARTERY BYPASS WITH  8 MM HEMASHIELD GRAFT,RIGHT COMMON SUPERFICIAL FEMORAL ARTERY ENDARTERECTOMY;  Surgeon: Elam Dutch, MD;  Location: [place];  Service: Vascular;   Laterality: Bilateral;    SOCIAL HISTORY: Social History   Social History  . Marital Status: Widowed    Spouse Name: N/A  . Number of Children: 6  . Years of Education: 13   Occupational History  . Disability    Social History Main Topics  . Smoking status: Never Smoker   . Smokeless tobacco: Never Used     Comment: pack per month of cigars = 5  . Alcohol Use: No     Comment: socially   . Drug Use: Yes    Special: Marijuana     Comment: 04/17/15- 2 weeks ago  . Sexual Activity: No   Other Topics Concern  . Not on file   Social History Narrative   Born and raised in [place], [place]. Currently resides in a house by herself. No pets. Fun: Read, shoot pool.    Denies religious beliefs that would effect health care.    Lives at home alone.   Right-handed.   No caffeine use.    FAMILY HISTORY: Family History  Problem Relation Age of Onset  .  Brain cancer Sister 12  . Lung cancer Brother 48  . Heart attack Mother   . Heart attack Father     57s  . Heart attack Maternal Uncle   . Diabetes Maternal Uncle   . Ovarian cancer Paternal Aunt     3 paternal aunts with ovarian cancer  . Diabetes Paternal Uncle   . Heart attack Brother     ALLERGIES:  is allergic to other; demerol; morphine and related; oxycodone; penicillins; tylenol; and ibuprofen.  MEDICATIONS:  Current Outpatient Prescriptions  Medication Sig Dispense Refill  . docusate sodium 100 MG CAPS Take 100 mg by mouth 2 (two) times daily as needed for mild constipation. 10 capsule 0  . DULoxetine (CYMBALTA) 30 MG capsule Take 1 capsule (30 mg total) by mouth daily. 30 capsule 1  . LYRICA 100 MG capsule take 2 capsules by mouth three times a day 180 capsule 0  . methylPREDNISolone (MEDROL DOSEPAK) 4 MG TBPK tablet follow package directions 21 tablet 0   No current facility-administered medications for this visit.    REVIEW OF SYSTEMS:   Constitutional: Denies fevers, chills or abnormal night sweats, (+)  weight loss and fatigue  Eyes: Denies blurriness of vision, double vision or watery eyes Ears, nose, mouth, throat, and face: Denies mucositis or sore throat Respiratory: Denies cough, (+) dyspnea on exertion, no wheezes Cardiovascular: Denies palpitation, chest discomfort or lower extremity swelling Gastrointestinal:no nausea or abdominal pain, no heartburn or change in bowel habits Skin: Denies abnormal skin rashes Lymphatics: Denies new lymphadenopathy or easy bruising Neurological:see HPI  Behavioral/Psych: Mood is stable, no new changes  All other systems were reviewed with the patient and are negative.  PHYSICAL EXAMINATION: ECOG PERFORMANCE STATUS: 2  Filed Vitals:   11/08/15 1254  BP: 188/96  Pulse: 85  Temp: 98.4 F (36.9 C)  Resp: 18   Filed Weights   11/08/15 1254  Weight: 155 lb 3.2 oz (70.398 kg)    GENERAL:alert, slightly distressed due to her pain SKIN: skin color, texture, turgor are normal, no rashes or significant lesions EYES: normal, conjunctiva are pink and non-injected, sclera clear OROPHARYNX:no exudate, no erythema and lips, buccal mucosa, and tongue normal  NECK: supple, thyroid normal size, non-tender, without nodularity LYMPH:  no palpable lymphadenopathy in the cervical, axillary or inguinal LUNGS: clear to auscultation and percussion with normal breathing effort HEART: regular rate & rhythm and no murmurs and no lower extremity edema ABDOMEN:abdomen soft, (+) tenderness at middle and low abdomen with gentle palpitation, no rebound pain, normal bowel sounds, no organmegaly. She has a stable left over at the surgical scar, which was removed by my nurse today. Musculoskeletal:no cyanosis of digits and no clubbing  PSYCH: alert & oriented x 3 with fluent speech NEURO: no focal neuro deficits. EXT: (+)   Skin pigmentation in the left foot,  Left foot appears to be colder than her leg,  No skin ulcer.   LABORATORY DATA:  I have reviewed the data as  listed CBC Latest Ref Rng 11/01/2015 08/10/2015 05/11/2015  WBC 3.9 - 10.3 10e3/uL 7.9 5.1 5.3  Hemoglobin 11.6 - 15.9 g/dL 13.9 14.5 13.1  Hematocrit 34.8 - 46.6 % 39.3 41.7 37.2  Platelets 145 - 400 10e3/uL 131(L) 140(L) 180    Recent Labs  12/29/14 0446 01/11/15 1116  04/19/15 0248 05/11/15 1451 08/10/15 1338 11/01/15 1245  NA 137 138  < > 138 140 141 140  K 3.9 3.3*  < > 4.0 4.5 4.8 4.2  CL 105 102  --  102  --   --   --   CO2 24 25  < > 28 27 30* 25  GLUCOSE 83 100*  < > 122* 84 76 71  BUN 13 17  < > 9 19.7 23.4 21.0  CREATININE 0.75 0.66  < > 0.74 0.8 0.9 0.9  CALCIUM 8.5 9.6  < > 9.0 10.0 9.7 9.2  GFRNONAA >90 >90  --  >60  --   --   --   GFRAA >90 >90  --  >60  --   --   --   PROT  --   --   < >  --  7.7 7.5 7.2  ALBUMIN  --   --   < >  --  4.1 3.9 3.7  AST  --   --   < >  --  36* 60* 26  ALT  --   --   < >  --  26 60* 26  ALKPHOS  --   --   < >  --  73 101 88  BILITOT  --   --   < >  --  0.68 0.60 0.51  < > = values in this interval not displayed.  Results for SYA, NESTLER (MRN [id number]) as of 11/08/2015 14:27  Ref. Range 05/11/2015 15:43 08/10/2015 13:37 11/01/2015 12:45  CA 125 Latest Ref Range: <35 U/mL 8 6 5   CEA Latest Ref Range: 0.0-5.0 ng/mL 0.7 1.3 1.2    PATHOLOGY REPORT 09/18/2014 Colon, segmental resection for tumor, terminal ileum and right - ULCERATED AND INVASIVE ADENOCARCINOMA SEE COMMENT. - NEGATIVE FOR LYMPH VASCULAR INVASION - NEGATIVE FOR PERINEURAL INVASION. 1 of 4 FINAL for Hendryx, Starlynn [phone]) Diagnosis(continued) - TUMOR EXTENDS INTO PERICOLONIC SOFT TISSUE AND IS LESS THAN 1 MM FROM THE SEROSAL SURFACE. - TWENTY-THREE LYMPH NODES, NEGATIVE FOR TUMOR (0/23) - ONE SOFT TISSUE TUMOR DEPOSIT. - SURGICAL MARGINS, NEGATIVE FOR TUMOR. Microscopic Comment COLON AND RECTUM (INCLUDING TRANS-ANAL RESECTION): Specimen: Terminal ileum and right colon. Procedure: Right colectomy. Tumor site: Cecum. Specimen integrity:  Intact. Macroscopic intactness of mesorectum: N/A Macroscopic tumor perforation: Absent. Invasive tumor: Maximum size: 4.7 cm Histologic type(s): Adenocarcinoma Histologic grade and differentiation: G2: moderately differentiated/low grade Type of polyp in which invasive carcinoma arose: Tubular adenoma with high grade dysplasia Microscopic extension of invasive tumor: Tumor extends into pericolonic soft tissue, see comment. Lymph-Vascular invasion: Absent. Peri-neural invasion: Absent. Tumor deposit(s) (discontinuous extramural extension): (x1) Resection margins: Proximal margin: Negative. Distal margin: Negative. Circumferential (radial) (posterior ascending, posterior descending; lateral and posterior mid-rectum; and entire lower 1/3 rectum): Negative Mesenteric margin (sigmoid and transverse): N/A Distance closest margin (if all above margins negative): 3.3 cm (distal) Trans-anal resection margins only: Deep margin: N/A Mucosal Margin: N/A Distance closest mucosal margin (if negative): N/A Treatment effect (neo-adjuvant therapy): None. Additional polyp(s): None. Non-neoplastic findings: None Lymph nodes: number examined- 23; number positive: 0 Pathologic Staging: pT3, pN1c, pMX, see comment. Ancillary studies: Per the [place] gastrointestinal working group guidelines, tumor will be submitted for both mismatch repair protein expression by immunohistochemistry and microsatellite instability testing by PCR. The results will be reported in an add on. Comment: Although tumor is not directly involving the serosa (visceral peritoneum), tumor is less than 1 mm from the serosal surface and is associated with fibroinflammatory inflammation involving the serosa. Some studies consider this finding to represent serosal involvement.  Tumor MSI: STABLE   Ovary, left and cystic mass 11/01/2014 - MIXED  ENDOMETRIOID AND CLEAR CELL CARCINOMA, FIGO GRADE III/III, OVARIAN PRIMARY. - FOCAL  FALLOPIAN TUBE CARCINOMA IN SITU. - SEE ONCOLOGY TABLE BELOW.  Specimen(s): Left ovary and left fallopian tube. Procedure: (including lymph node sampling) Salpingo-oophorectomy. Primary tumor site (including laterality): Left ovary. Ovarian surface involvement: Present. Ovarian capsule intact without fragmentation: Disrupted. Maximum tumor size (cm): At least 3.7 cm. Histologic type: Mixed endometrioid and clear cell adenocarcinoma. Grade: III Peritoneal implants: (specify invasive or non-invasive): Not identified. Pelvic extension (list additional structures on separate lines and if involved): Not identified. Lymph nodes: number examined 0 ; number positive N/A TNM code: pT1c, pNX FIGO Stage (based on pathologic findings, needs clinical correlation): IC, at least. Comments: The carcinoma present in the current specimen is morphologically dissimilar from the tumor in the patient's previous colectomy, [phone]. The tumor cells in the current case are positive for cytokeratin 7 and estrogen receptor. There is faint staining for p53. Cytokeratin 20 and CDX2 are negative. 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 There is a small focus of carcinoma in situ present in fallopian tube epithelium in random tissue. Dr Claudette Laws has reviewed the case and concurs with the interpretation. The case was discussed with Dr Hulen Skains on 11/06/2014. (  RADIOGRAPHIC STUDIES: I have personally reviewed the radiological images as listed and agreed with the findings in the report.  Ct chest, Abdomen Pelvis W Contrast 09/13/2015  IMPRESSION: 1. No evidence of metastatic disease in the chest, abdomen or pelvis. 2. Stable postsurgical changes status post right hemicolectomy and TAH-BSO, with no evidence of local tumor recurrence. 3. Patent femoral-femoral arterial bypass graft.   ASSESSMENT & PLAN:  54 year old female  with past medical history of arthritis, hysterectomy, history of abdominal bleeding status post embolization in 2005, who presented with abdominal pain nausea vomiting and weight loss. CT of abdomen showed a 3.8cm cecal mass with associated ileocecal intussusception, and a 9.1cm solid and cystic mass in the left pelvis,which was found to be a ovarian cancer. She is status post right hemicolectomy and terminal ileoectomy on 09/18/2014, and left Salpingo-oophorectomy on 11/01/14.   1. Right colon cancer, PT3pN1M0, stage III, MSI-stable  -I reviewed her CT scan and the surgical pathology findings with her in details. Based on her surgical pathology, she has stage III disease.  -Her tumor microsatellite stability test showed stable, she unlikely has Lynch syndrome. -Althrough I recommended adjuvant chemotherapy, which is standard care for stage III colon cancer, her chemotherapy was postponed due to her multiple surgery, significance of pain after surgeries, and compliance issue (multiple no show), she was 5 months out of initial colon surgery when she returned for follow up after surgery, and the benefit of adjuvant chemotherapy was minimal at that point. So she did not receive adjuvant chemotherapy -we discussed survelliance plan, for a total of 5 years. Given the high risk of cancer recurrence, I plan to see her every 3-4 months for the first 2 years, with a repeated CT scan every 3-6 months. - I reviewed her repeated CT scan from October 2016, which showed no evidence of recurrence -Lab review,  Normal CBC and CMP, CEA.  - We'll continue cancer surveillance  2. Ovary cancer, left, pT1 cNX M0, at least stage I, mixed endometrioid and clear cell histology, grade 3, ER positive. - She underwent right right oophorectomy and  Pelvic lymph nodes biopsy for staging, which wall over negative. -She is likely need adjuvant chemotherapy, the standard therapy  is carboplatin and paclitaxel for overan cancer. She is  close to 3 months out of her initial ovary surgery, and she developed left lower extremity neuropathy after her recent surgery, not a great candidate for chemotherapy. I discussed with Dr. Denman George about her adjuvant adjuvant chemotherapy. Dr. Denman George feels her colon cancer is at much high risk for recurrence than ovarian cancer, and she is also quite far our from her initial ovaran cancer surgery, the benefit of chemotherapy for ovarian cancer is very limited. Due to her limited PS and pain issue, she is also not a good candidate for chemo.  -We'll follow up CA125,  Which has been normal.  3. BRCA1 mutation carrier -I previously discussed her recent genetic testing results which revealed positive BRCA1 mutation. -I previously discussed extensively about the risk of breast cancer (57-65% by age of 68), and I recommend starting screening with mammogram. She has never had a mammogram. - her screening mammogram  From September 2016 was normal - we discussed  prophylactic mastectomy today,  which is standard recommendation for BRCA1 mutation carrier. She became extremely upset, anxious, started crying and had difficulty to understand why she would need more surgery. I asked our SW Abby Potash to speak to her.   4. abdominal and left lower extremity neuropathy   -Likely related to her peripheral vascular disease and a neuropathy of the surgery. -Improved after vascular surgery. - she has been seen by pain clinic, not sure if she'll go back.  4. PVD -her left foot is still cold, I encourage her to follow up with Dr. Eden Lathe   5. Coping and social support, history of depression and PTSD -She has had some difficulty coping with her synchronized colon and ovarian cancer. She lives alone, does not want her children to be involved much in her cancer care.  - she appears to be quite anxious,  Depressed.   We discussed psych referral and she declined.  Plan -RTC in 4 month for follow up, alb one week before her  appointment  All questions were answered. The patient knows to call the clinic with any problems, questions or concerns. I spent 20 minutes counseling the patient face to face. The total time spent in the appointment was 30 minutes and more than 50% was on counseling.     Truitt Merle, MD 11/08/2015

## 2015-11-08 NOTE — Progress Notes (Signed)
[facility]osocial Assessment Clinical Social Work  Clinical Social Work was referred by medical oncologist due to depression and anxiety.   Clinical Social Worker met with patient in exam room to assess psychosocial, emotional, mental health, and spiritual needs of the patient. Pt is in the follow up stage of her cancer and shared many issues related to survivorship; anxieties about recurrence, processing her cancer journey and expressed emotions of grief.  Patient's knowledge about cancer and its treatment including level of understanding, reactions, goals for care, and expectations: Pt very anxious about having "the bad genes inside", and wanted to know how she could change this and how this happened. Pt appears to have limited understanding about her BRCA1 mutation, she wondered if her family did something bad. We reviewed what some of this meant and why Dr Burr Medico was recommending certain procedures. When discussing these recommendations, she gets very anxious, but is able to work through her anxiety through breathing exercises. Pt appears to have several ways to work through her anxiety that help her. Pt appears to need to process her cancer journey in order to move forward.  Characteristics of the patient's support system: Pt reports her son is supportive, but she does not like to burden her family with her cancer, as many members of the family have passed away from cancer diagnoses. She also gets support from her church and finds this helpful.  Patient and family psychosocial functioning including strengths, limitations, and coping skills: Pt has coping techniques that assist her through her pain and anxiety. She does not want to take the cymbalta anymore, stating she has not slept in days since starting. She does not want to take lots of medicine, per her report. She is not open to psych referral, but is open to support programs through [employer]. CSW strongly  feels processing her cancer journey with other survivors and those aware of cancer issues will be most helpful. She was provided with their contact info today and plans to follow up with them .CSW suggested additional counseling would be very helpful and this CSW is available to assist. Pt will also consider. She is not open to groups here in the evening as she does not like to go out at night.  Identifications of barriers to care: Pt may need additional education in order to access care as needed in future. It appears her biggest barrier is her lack of information and ability to process.  Availability of community resources: Duanne Limerick, etc. Pt uses [facility] and is open to outside resources for assistance.  Clinical Social Worker follow up needed: No.  If yes, follow up plan: Pt will reach out to CSW as needed and Greer Ee, [place]  [facility] Phone: [phone] Fax: [phone]

## 2015-11-12 ENCOUNTER — Ambulatory Visit: Payer: Medicare Other | Admitting: Hematology

## 2015-11-22 ENCOUNTER — Telehealth: Payer: Self-pay | Admitting: *Deleted

## 2015-11-22 DIAGNOSIS — Z0000 Encounter for general adult medical examination without abnormal findings: Secondary | ICD-10-CM

## 2015-11-22 NOTE — Telephone Encounter (Signed)
Left msg on triage stating needing refill on her Lyrica & has ? Concerning Hep C shot...[name]

## 2015-11-23 MED ORDER — PREGABALIN 100 MG PO CAPS: ORAL_CAPSULE | ORAL | Status: DC

## 2015-11-23 NOTE — Telephone Encounter (Signed)
Hepatitis C screen order placed.

## 2015-11-23 NOTE — Telephone Encounter (Signed)
Notified pt rx was approved will fax to [facility]. Ask about Hep C ? She is actually wanting to have the Hep C screening. If ok want to have done at [facility] since she lives in [place]...[name]

## 2015-11-23 NOTE — Telephone Encounter (Signed)
Pls advise on msg below.../lmb 

## 2015-11-23 NOTE — Telephone Encounter (Signed)
Notified pt order has been placed../lmb 

## 2015-11-23 NOTE — Telephone Encounter (Signed)
Medication refilled. What questions about a hepatitis C shot?

## 2015-12-21 ENCOUNTER — Encounter: Payer: Self-pay | Admitting: Physical Medicine & Rehabilitation

## 2015-12-21 ENCOUNTER — Ambulatory Visit (HOSPITAL_BASED_OUTPATIENT_CLINIC_OR_DEPARTMENT_OTHER): Payer: Medicare Other | Admitting: Physical Medicine & Rehabilitation

## 2015-12-21 ENCOUNTER — Encounter: Payer: Medicare Other | Attending: Physical Medicine & Rehabilitation

## 2015-12-21 VITALS — BP 157/81 | HR 81 | Resp 16

## 2015-12-21 DIAGNOSIS — F4310 Post-traumatic stress disorder, unspecified: Secondary | ICD-10-CM | POA: Diagnosis not present

## 2015-12-21 DIAGNOSIS — K219 Gastro-esophageal reflux disease without esophagitis: Secondary | ICD-10-CM | POA: Diagnosis not present

## 2015-12-21 DIAGNOSIS — Z79899 Other long term (current) drug therapy: Secondary | ICD-10-CM | POA: Insufficient documentation

## 2015-12-21 DIAGNOSIS — R51 Headache: Secondary | ICD-10-CM | POA: Insufficient documentation

## 2015-12-21 DIAGNOSIS — G541 Lumbosacral plexus disorders: Secondary | ICD-10-CM | POA: Diagnosis not present

## 2015-12-21 DIAGNOSIS — G90522 Complex regional pain syndrome I of left lower limb: Secondary | ICD-10-CM | POA: Insufficient documentation

## 2015-12-21 DIAGNOSIS — I10 Essential (primary) hypertension: Secondary | ICD-10-CM | POA: Insufficient documentation

## 2015-12-21 DIAGNOSIS — M1990 Unspecified osteoarthritis, unspecified site: Secondary | ICD-10-CM | POA: Insufficient documentation

## 2015-12-21 DIAGNOSIS — Z7952 Long term (current) use of systemic steroids: Secondary | ICD-10-CM | POA: Insufficient documentation

## 2015-12-21 DIAGNOSIS — D649 Anemia, unspecified: Secondary | ICD-10-CM | POA: Insufficient documentation

## 2015-12-21 DIAGNOSIS — C7960 Secondary malignant neoplasm of unspecified ovary: Secondary | ICD-10-CM | POA: Insufficient documentation

## 2015-12-21 DIAGNOSIS — C189 Malignant neoplasm of colon, unspecified: Secondary | ICD-10-CM | POA: Insufficient documentation

## 2015-12-21 DIAGNOSIS — G5642 Causalgia of left upper limb: Secondary | ICD-10-CM | POA: Diagnosis not present

## 2015-12-21 MED ORDER — PREGABALIN 200 MG PO CAPS: ORAL_CAPSULE | ORAL | Status: DC

## 2015-12-21 NOTE — Patient Instructions (Signed)
You may buy a lace up left ankles splint at your local pharmacy
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
## 2016-01-03 ENCOUNTER — Encounter: Payer: Self-pay | Admitting: *Deleted

## 2016-01-03 NOTE — Progress Notes (Signed)
[place] Work  Clinical Social Work was referred by pt due to request for more information about programs. CSW reviewed options for pt as she begins to navigate survivorship. Pt very interested in [facility] and writing program through [employer]. CSW mailing pt information. She was encouraged to explore [facility] as additional source of support. Pt upbeat and reports to be doing well currently, "ready to do more".   Clinical Social Work interventions: Resource education  Loren Racer, Micro Worker [place]  [place] Phone: [phone] Fax: [phone]

## 2016-01-11 ENCOUNTER — Telehealth: Payer: Self-pay | Admitting: Genetic Counselor

## 2016-01-11 NOTE — Telephone Encounter (Signed)
LM on VM that I was returning her phone call and to please call back at her convenience.

## 2016-01-21 ENCOUNTER — Telehealth: Payer: Self-pay | Admitting: Physical Medicine & Rehabilitation

## 2016-01-21 NOTE — Telephone Encounter (Signed)
Patient called to let us know that she is needing medication that is for her stomach spasms (Dr. Everitt Amber) gave this to her last year.  She said that Dr. Letta Pate told her that if she gets any medication from another doctor to let him know.

## 2016-01-22 NOTE — Telephone Encounter (Signed)
I'm not clear. Is the patient requesting a medicine for stomach spasms or did she get prescribed the medicine by another doctor for her stomach spasms

## 2016-01-22 NOTE — Telephone Encounter (Signed)
She was just calling to give [place] the information that she needed to get it again. Not asking Dr Letta Pate for it.

## 2016-02-04 ENCOUNTER — Other Ambulatory Visit: Payer: Self-pay | Admitting: Physical Medicine & Rehabilitation

## 2016-02-11 ENCOUNTER — Telehealth: Payer: Self-pay | Admitting: Genetic Counselor

## 2016-02-11 NOTE — Telephone Encounter (Signed)
Received forwarded staff message from HIM to schedule patient for genetics f/u per Santiago Glad. No lab needed. Left message for patient re genetics f/u 4/24 @ 1 pm. Also confirmed 4/10 and 4/17 visits - patient to get updated scheduled at 4/10 visit.

## 2016-02-16 IMAGING — CT CT L SPINE W/O CM
4 of 8 series · 13 of 33 positions shown, 14 images · non-contrast
Comparison: CT abdomen and pelvis 10/09/2014. Lumbar spine MRI
08/14/2005.

CLINICAL DATA: Left leg pain and numbness with left foot drop for 1
month. Left lower extremity weakness. History of colon and ovarian
cancer.

EXAM:
CT LUMBAR SPINE WITHOUT CONTRAST
TECHNIQUE: Multidetector CT imaging of the lumbar spine was performed without
intravenous contrast administration. Multiplanar CT image
reconstructions were also generated.

[Series 2: l-spine 3.0 b30s · axial · 0.33mm/px · z∈[-560,-416]mm · 4 of 82 slices shown, 5 images]
[im 17/82  soft-tissue]
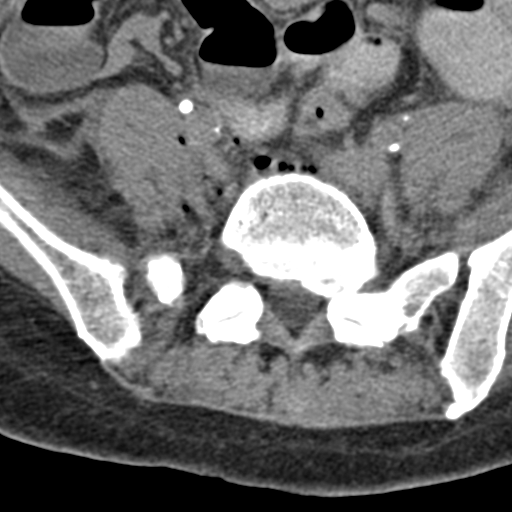
[im 17/82  bone]
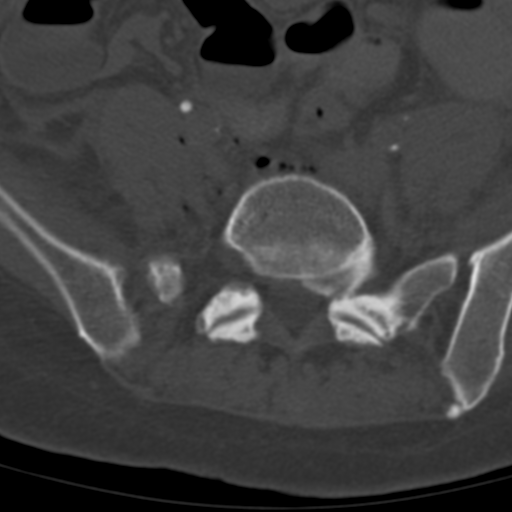
[im 33/82  bone]
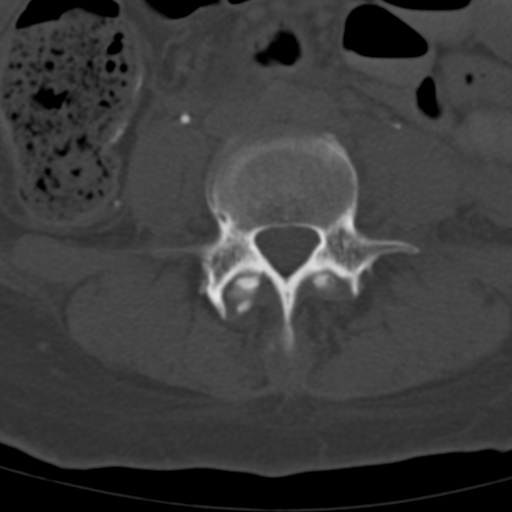
[im 49/82  bone]
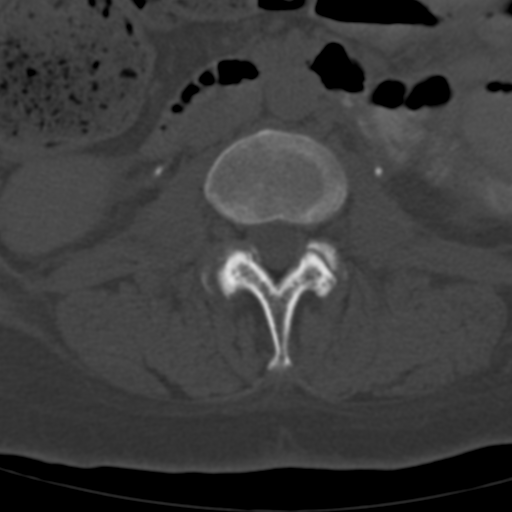
[im 65/82  bone]
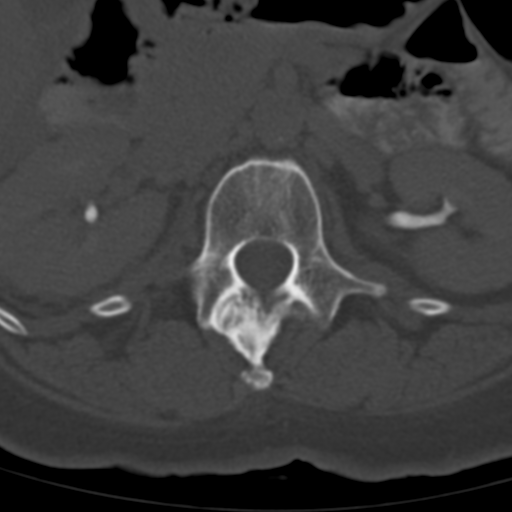

[Series 604: axial lspine · axial · 0.31mm/px · z∈[-461,-418]mm · 2 of 43 slices shown (1 of 2)]
[im 15/43  bone]
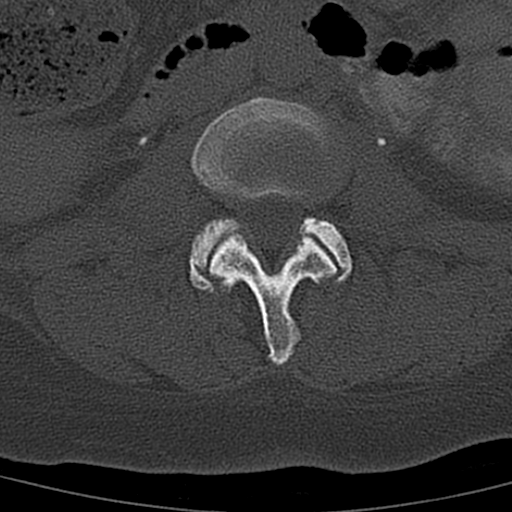
[im 29/43  bone]
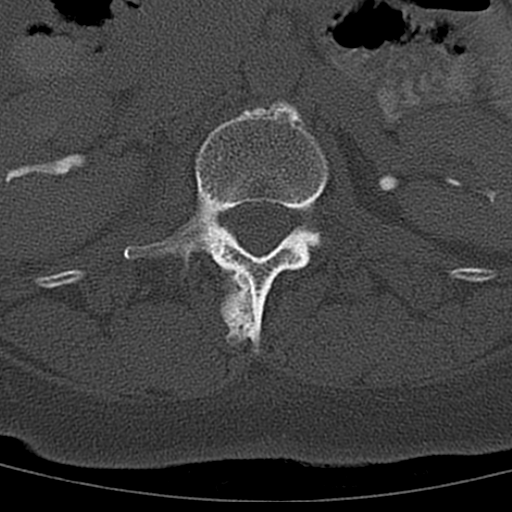

[Series 605: axial lspine · axial · 0.40mm/px · z∈[-582,-542]mm · 2 of 42 slices shown (2 of 2)]
[im 14/42  bone]
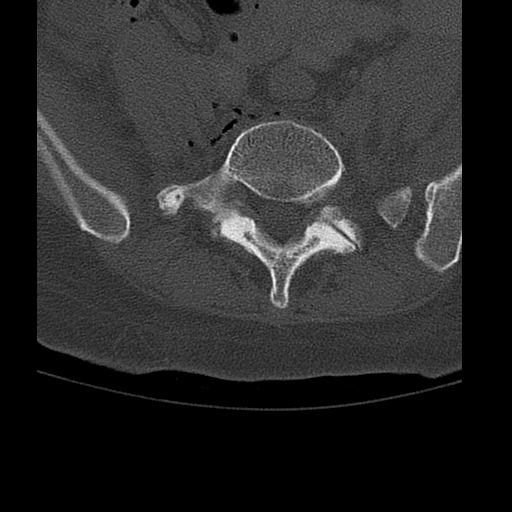
[im 28/42  bone]
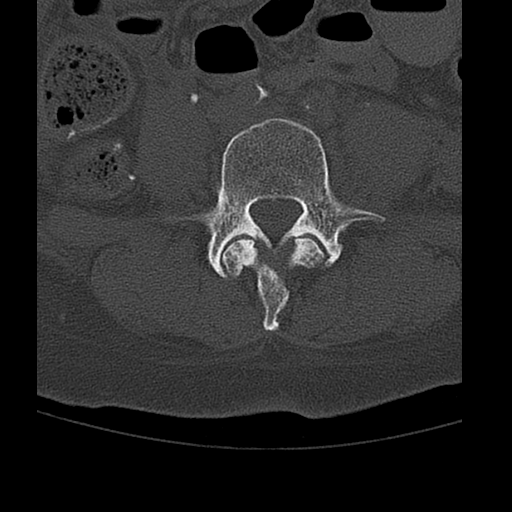

[Series 606: <mpr thick range> · sagittal · 0.48mm/px · 5 of 42 slices shown]
[im 7/42  bone]
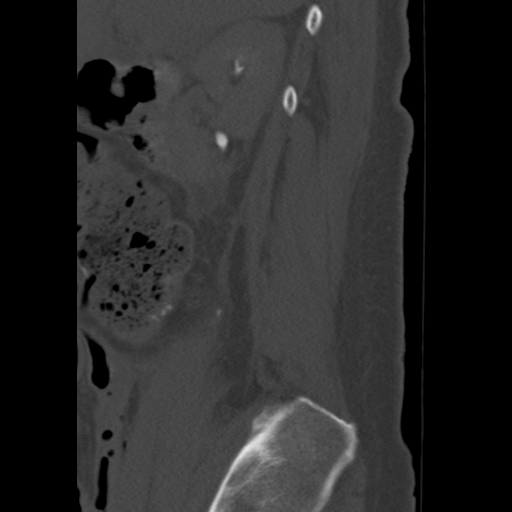
[im 14/42  bone]
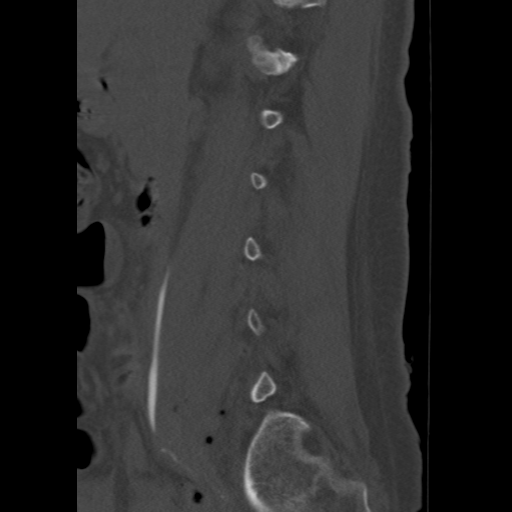
[im 21/42  bone]
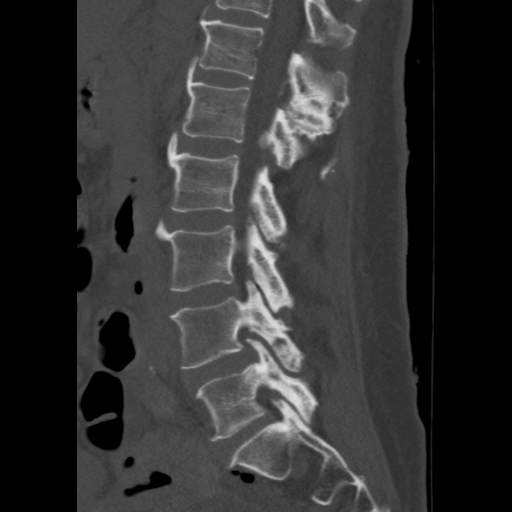
[im 28/42  bone]
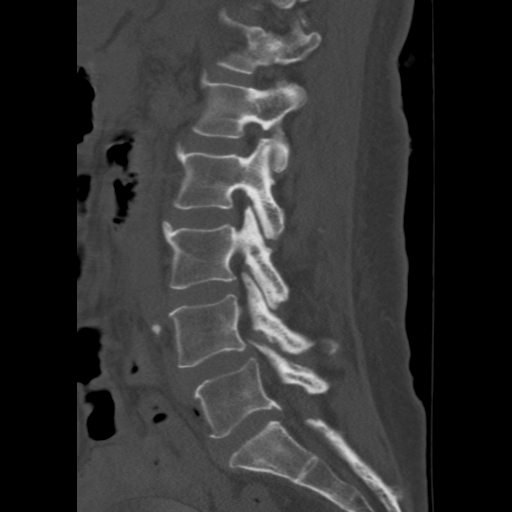
[im 35/42  bone]
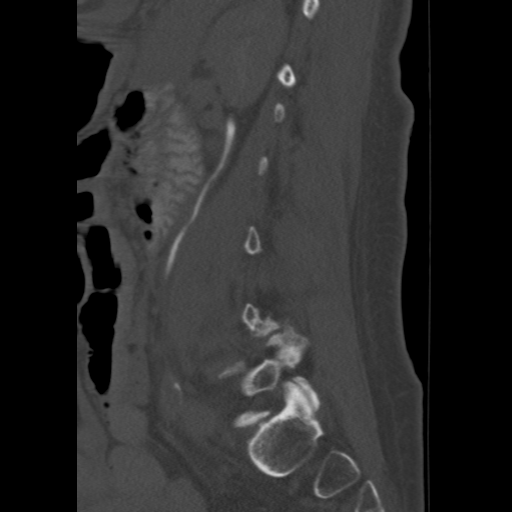

[13 of 33 positions shown; findings below may reference images not displayed]

FINDINGS: Grade 1 anterolisthesis of L4 on L5 measures 4 mm, likely facet
mediated. There is slight right convex curvature of the spine at the
thoracolumbar junction. Lumbar vertebral body heights are preserved
without evidence of compression fracture. No lytic or blastic
lesions are seen to suggest osseous metastases. Bridging anterior
osteophytes are present in the lower thoracic and upper lumbar
spine. Excreted IV contrast is noted in the renal collecting
systems. Abdominal and pelvic soft tissues are more fully evaluated
on concurrent dedicated CT of the abdomen and pelvis.

L1-2:  Mild facet arthrosis without disc herniation or stenosis.

L2-3: Minimal disc bulging and mild to moderate facet arthrosis
without stenosis.

L3-4: Minimal disc bulging and moderate facet arthrosis result in
mild left neural foraminal stenosis without spinal stenosis. Neural
foraminal stenosis does not appear significantly changed from the
prior MRI.

L4-5: Slight listhesis, mild disc bulging, ligamentum flavum
hypertrophy, and severe facet arthrosis result in moderate spinal
stenosis and moderate bilateral neural foraminal stenosis,
progressed from prior MRI.

L5-S1: Disc bulging and endplate osteophytosis asymmetric to the
left and mild bilateral facet arthrosis result in mild left lateral
recess and moderate left neural foraminal stenosis, similar to prior
MRI. No significant spinal stenosis.
IMPRESSION: 1. Mild disc degeneration and moderate to severe lower lumbar facet
arthrosis, worst at L4-5 where there is moderate spinal stenosis and
moderate bilateral neural foraminal stenosis, progressed from prior
MRI.
2. No evidence of osseous metastases in the lumbar spine.

## 2016-03-03 ENCOUNTER — Other Ambulatory Visit (HOSPITAL_BASED_OUTPATIENT_CLINIC_OR_DEPARTMENT_OTHER): Payer: Medicare Other

## 2016-03-03 DIAGNOSIS — C182 Malignant neoplasm of ascending colon: Secondary | ICD-10-CM

## 2016-03-03 DIAGNOSIS — C562 Malignant neoplasm of left ovary: Secondary | ICD-10-CM | POA: Diagnosis not present

## 2016-03-04 LAB — CANCER ANTIGEN 125 (PARALLEL TESTING): CA 125: 6 U/mL (ref <35)

## 2016-03-04 LAB — CA 125: Cancer Antigen (CA) 125: 5.4 U/mL (ref 0.0–38.1)

## 2016-03-05 ENCOUNTER — Encounter: Payer: Self-pay | Admitting: Family

## 2016-03-10 ENCOUNTER — Telehealth: Payer: Self-pay | Admitting: Hematology

## 2016-03-10 ENCOUNTER — Ambulatory Visit (HOSPITAL_BASED_OUTPATIENT_CLINIC_OR_DEPARTMENT_OTHER): Payer: Medicare Other | Admitting: Hematology

## 2016-03-10 VITALS — BP 172/97 | HR 80 | Temp 98.3°F | Resp 20 | Ht 67.0 in | Wt 177.6 lb

## 2016-03-10 DIAGNOSIS — C182 Malignant neoplasm of ascending colon: Secondary | ICD-10-CM | POA: Diagnosis not present

## 2016-03-10 DIAGNOSIS — Z1501 Genetic susceptibility to malignant neoplasm of breast: Secondary | ICD-10-CM

## 2016-03-10 DIAGNOSIS — C562 Malignant neoplasm of left ovary: Secondary | ICD-10-CM

## 2016-03-10 DIAGNOSIS — I739 Peripheral vascular disease, unspecified: Secondary | ICD-10-CM

## 2016-03-10 DIAGNOSIS — G5790 Unspecified mononeuropathy of unspecified lower limb: Secondary | ICD-10-CM

## 2016-03-10 NOTE — Progress Notes (Signed)
[place]  [phone] Fax:([phone]) [phone]  Clinic follow Up Note   Patient Care Team: Golden Circle, FNP as PCP - General (Family Medicine) Truitt Merle, MD as Consulting Physician (Hematology) Judeth Horn, MD as Consulting Physician (General Surgery) Nat Math, MD as Referring Physician (Obstetrics and Gynecology) Everitt Amber, MD as Consulting Physician (Obstetrics and Gynecology) Elam Dutch, MD as Consulting Physician (Vascular Surgery) 03/10/2016   CHIEF COMPLAINTS/PURPOSE OF CONSULTATION:  1. Colon cancer, pT3N1M0, stage III 2. Ovarian cancer, pT1cNxM0, stage I  3. BRCA1 mutation (+)    Cancer of right colon ([place])   09/16/2014 Imaging CT abd/pel:  a 3.8cm cecal mass with associated ileocecal intussusception, and a 9.1cm solid and cystic mass in the left pelvis,   09/18/2014 Initial Diagnosis Adenocarcinoma of right colon   09/18/2014 Pathologic Stage pT3pN1Mx, tumor extends into pericolonic soft tissue and is less than 39m from the serosal surface, LVI 8-), PNI (-), all 23 node negative, one soft tissue tumor deposit, surgical margins negative.    09/18/2014 Surgery right hemicolectomy and terminal ileoectomy    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 ([place]   10/09/2014 Imaging CT of the abdomen and pelvis showed a 9.1 cm solid and cystic mass in the left pelvis concerning for malignancy. This was discovered during her workup for colon cancer.   11/01/2014 Initial Diagnosis 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   11/01/2014 Surgery Salpingo-oophorectomy.   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 Tumor size 3.7 cm, no lymph nodes removed. T1cNx. Tumor cells are positive for cytokeratin 7 and estrogen receptor.     HISTORY OF PRESENTING ILLNESS:  [name].[id number]. female is here because of recently diagnosed colon cancer.   She presented with abdominal pain,  nausea and vomiting, hemoptoses in Oct 2015.  [facility] CT scan that revealed a 3.8cm cecal mass with associated ileocecal intussusception, and a 9.1cm solid and cystic mass in the left pelvis, likely a peritoneal met with ascites.  EGD was negative. She had exploratory laparoscopy on 09/18/2014 by Dr. WHulen Skains and underwent right hemicolectomy and ileocecectomy. The pathology reviewed also related an invasive adenocarcinoma at the terminal ileum and cecum. She was discharged home on 09/22/2014.  She underwent a second surgery for left pelvis cystic mass resection on 11/01/2014, surgical path showed ovarian cancer.  CURRENT TREATMENT:   Surveillance  INTERIM HISTORY: TBelmareturns for follow-up. I saw her 4 months ago.  She is doing a lot better lately.  She has great appetite A, eating very well, she gained a lot of weight. Her left leg pain is much improved, she takes  Lyrica,  Able to ambulatory without any difficulty. She is in good mood today, denies any abdominal discomfort, or other new symptoms.  MEDICAL HISTORY:  Past Medical History  Diagnosis Date  . Hypertension   . Ectopic pregnancy   . Head injury, closed, with brief LOC ([place]     had a cut- as a child  . Asthma   . Anxiety   . Depression   . PTSD (post-traumatic stress disorder)   . Arthritis   . Pelvic cyst   . Complication of anesthesia     woke and saw long tube coming out of mouth . sat up then went back to sleep  . PONV (  postoperative nausea and vomiting)   . Headache   . Cancer ([place])     colon and ovarian  . Anemia   . History of blood transfusion     2005 approximately   . Family history of ovarian cancer   . Family history of adverse reaction to anesthesia     sister on dialysis- hypotension   . GERD (gastroesophageal reflux disease)     occasional  . PAD (peripheral artery  disease) ([place]) 04/18/2015  PTSD   SURGICAL HISTORY: Past Surgical History  Procedure Laterality Date  . Esophagogastroduodenoscopy N/A 09/17/2014    Procedure: ESOPHAGOGASTRODUODENOSCOPY (EGD);  Surgeon: Jeryl Columbia, MD;  Location: [facility] END[facility]COPY;  Service: Endoscopy;  Laterality: N/A;  . Abdominal hysterectomy    . Tubal ligation    . Partial colectomy Right 09/18/2014    Procedure: RIGHT HEMI COLECTOMY;  Surgeon: Doreen Salvage, MD;  Location: [place];  Service: General;  Laterality: Right;  . Ileostomy Right 09/18/2014    Procedure: ILEOCOLOSTOMY;  Surgeon: Doreen Salvage, MD;  Location: [place];  Service: General;  Laterality: Right;  . Laparotomy N/A 11/01/2014    Procedure: EXPLORATORY LAPAROTOMY;  Surgeon: Doreen Salvage, MD;  Location: [place];  Service: General;  Laterality: N/A;  . Mass excision  11/01/2014    Procedure: EXCISION PELVIC MASS;  Surgeon: Doreen Salvage, MD;  Location: [place];  Service: General;;  . Robotic assisted total hysterectomy with bilateral salpingo oopherectomy Right 12/26/2014    Procedure: EXPLORATORY LAPAROSCOPY  RIGHT SALPINGO OOPHORECTOMY OMENTECTOMY LYMPHADECTOMY ;  Surgeon: Everitt Amber, MD;  Location: [facility];  Service: Gynecology;  Laterality: Right;  . Laparotomy N/A 12/26/2014    Procedure:  LAPAROTOMY ;  Surgeon: Everitt Amber, MD;  Location: [facility];  Service: Gynecology;  Laterality: N/A;  . Oophorectomy  2016  . Colonoscopy    . Femoral-femoral bypass graft Bilateral 04/18/2015    Procedure:  RIGHT COMMON FEMORAL TO LEFT COMMON  FEMORAL  ARTERY BYPASS WITH  8 MM HEMASHIELD GRAFT,RIGHT COMMON SUPERFICIAL FEMORAL ARTERY ENDARTERECTOMY;  Surgeon: Elam Dutch, MD;  Location: [place];  Service: Vascular;  Laterality: Bilateral;    SOCIAL HISTORY: Social History   Social History  . Marital Status: Widowed    Spouse Name: N/A  . Number of Children: 6  . Years of Education: 13   Occupational History  . Disability    Social History Main Topics  . Smoking status: Never Smoker    . Smokeless tobacco: Never Used     Comment: pack per month of cigars = 5  . Alcohol Use: No     Comment: socially   . Drug Use: Yes    Special: Marijuana     Comment: 04/17/15- 2 weeks ago  . Sexual Activity: No   Other Topics Concern  . Not on file   Social History Narrative   Born and raised in [place], [place]. Currently resides in a house by herself. No pets. Fun: Read, shoot pool.    Denies religious beliefs that would effect health care.    Lives at home alone.   Right-handed.   No caffeine use.    FAMILY HISTORY: Family History  Problem Relation Age of Onset  . Brain cancer Sister 57  . Lung cancer Brother 51  . Heart attack Mother   . Heart attack Father     58s  . Heart attack Maternal Uncle   . Diabetes Maternal Uncle   . Ovarian cancer Paternal Aunt  3 paternal aunts with ovarian cancer  . Diabetes Paternal Uncle   . Heart attack Brother     ALLERGIES:  is allergic to other; demerol; morphine and related; oxycodone; penicillins; tylenol; and ibuprofen.  MEDICATIONS:  Current Outpatient Prescriptions  Medication Sig Dispense Refill  . docusate sodium 100 MG CAPS Take 100 mg by mouth 2 (two) times daily as needed for mild constipation. 10 capsule 0  . LYRICA 200 MG capsule take 1 capsule by mouth every 8 hours as directed by prescriber 90 capsule 5   No current facility-administered medications for this visit.    REVIEW OF SYSTEMS:   Constitutional: Denies fevers, chills or abnormal night sweats, (+) weight loss and fatigue  Eyes: Denies blurriness of vision, double vision or watery eyes Ears, nose, mouth, throat, and face: Denies mucositis or sore throat Respiratory: Denies cough, (+) dyspnea on exertion, no wheezes Cardiovascular: Denies palpitation, chest discomfort or lower extremity swelling Gastrointestinal:no nausea or abdominal pain, no heartburn or change in bowel habits Skin: Denies abnormal skin rashes Lymphatics: Denies new  lymphadenopathy or easy bruising Neurological:see HPI  Behavioral/Psych: Mood is stable, no new changes  All other systems were reviewed with the patient and are negative.  PHYSICAL EXAMINATION: ECOG PERFORMANCE STATUS: 1  Filed Vitals:   03/10/16 1322 03/10/16 1324  BP: 164/87 172/97  Pulse: 80   Temp: 98.3 F (36.8 C)   Resp: 20    Filed Weights   03/10/16 1322  Weight: 177 lb 9.6 oz (80.559 kg)    GENERAL:alert, slightly distressed due to her pain SKIN: skin color, texture, turgor are normal, no rashes or significant lesions EYES: normal, conjunctiva are pink and non-injected, sclera clear OROPHARYNX:no exudate, no erythema and lips, buccal mucosa, and tongue normal  NECK: supple, thyroid normal size, non-tender, without nodularity LYMPH:  no palpable lymphadenopathy in the cervical, axillary or inguinal LUNGS: clear to auscultation and percussion with normal breathing effort HEART: regular rate & rhythm and no murmurs and no lower extremity edema ABDOMEN:abdomen soft, (+) tenderness at middle and low abdomen with gentle palpitation, no rebound pain, normal bowel sounds, no organmegaly. She has a stable left over at the surgical scar, which was removed by my nurse today. Musculoskeletal:no cyanosis of digits and no clubbing  PSYCH: alert & oriented x 3 with fluent speech NEURO: no focal neuro deficits. EXT: (+)   Skin pigmentation in the left foot, no edema   LABORATORY DATA:  I have reviewed the data as listed CBC Latest Ref Rng 11/01/2015 08/10/2015 05/11/2015  WBC 3.9 - 10.3 10e3/uL 7.9 5.1 5.3  Hemoglobin 11.6 - 15.9 g/dL 13.9 14.5 13.1  Hematocrit 34.8 - 46.6 % 39.3 41.7 37.2  Platelets 145 - 400 10e3/uL 131(L) 140(L) 180   CMP Latest Ref Rng 11/01/2015 08/10/2015 05/11/2015  Glucose 70 - 140 mg/dl 71 76 84  BUN 7.0 - 26.0 mg/dL 21.0 23.4 19.7  Creatinine 0.6 - 1.1 mg/dL 0.9 0.9 0.8  Sodium 136 - 145 mEq/L 140 141 140  Potassium 3.5 - 5.1 mEq/L 4.2 4.8 4.5  Chloride  101 - 111 mmol/L - - -  CO2 22 - 29 mEq/L 25 30(H) 27  Calcium 8.4 - 10.4 mg/dL 9.2 9.7 10.0  Total Protein 6.4 - 8.3 g/dL 7.2 7.5 7.7  Total Bilirubin 0.20 - 1.20 mg/dL 0.51 0.60 0.68  Alkaline Phos 40 - 150 U/L 88 101 73  AST 5 - 34 U/L 26 60(H) 36(H)  ALT 0 - 55 U/L 26 60(H)  26   Results for JACKELIN, CORREIA (MRN [id number]) as of 03/13/2016 15:12  Ref. Range 11/01/2015 12:45 03/03/2016 13:15  CA 125 Latest Ref Range: <35 U/mL 5 6  Cancer Antigen (CA) 125 Latest Ref Range: 0.0-38.1 U/mL  5.4  CEA Latest Ref Range: 0.0-5.0 ng/mL 1.2    PATHOLOGY REPORT 09/18/2014 Colon, segmental resection for tumor, terminal ileum and right - ULCERATED AND INVASIVE ADENOCARCINOMA SEE COMMENT. - NEGATIVE FOR LYMPH VASCULAR INVASION - NEGATIVE FOR PERINEURAL INVASION. 1 of 4 FINAL for Pike, Redina [phone]) Diagnosis(continued) - TUMOR EXTENDS INTO PERICOLONIC SOFT TISSUE AND IS LESS THAN 1 MM FROM THE SEROSAL SURFACE. - TWENTY-THREE LYMPH NODES, NEGATIVE FOR TUMOR (0/23) - ONE SOFT TISSUE TUMOR DEPOSIT. - SURGICAL MARGINS, NEGATIVE FOR TUMOR. Microscopic Comment COLON AND RECTUM (INCLUDING TRANS-ANAL RESECTION): Specimen: Terminal ileum and right colon. Procedure: Right colectomy. Tumor site: Cecum. Specimen integrity: Intact. Macroscopic intactness of mesorectum: N/A Macroscopic tumor perforation: Absent. Invasive tumor: Maximum size: 4.7 cm Histologic type(s): Adenocarcinoma Histologic grade and differentiation: G2: moderately differentiated/low grade Type of polyp in which invasive carcinoma arose: Tubular adenoma with high grade dysplasia Microscopic extension of invasive tumor: Tumor extends into pericolonic soft tissue, see comment. Lymph-Vascular invasion: Absent. Peri-neural invasion: Absent. Tumor deposit(s) (discontinuous extramural extension): (x1) Resection margins: Proximal margin: Negative. Distal margin: Negative. Circumferential (radial) (posterior ascending,  posterior descending; lateral and posterior mid-rectum; and entire lower 1/3 rectum): Negative Mesenteric margin (sigmoid and transverse): N/A Distance closest margin (if all above margins negative): 3.3 cm (distal) Trans-anal resection margins only: Deep margin: N/A Mucosal Margin: N/A Distance closest mucosal margin (if negative): N/A Treatment effect (neo-adjuvant therapy): None. Additional polyp(s): None. Non-neoplastic findings: None Lymph nodes: number examined- 23; number positive: 0 Pathologic Staging: pT3, pN1c, pMX, see comment. Ancillary studies: Per the [facility] [place] gastrointestinal working group guidelines, tumor will be submitted for both mismatch repair protein expression by immunohistochemistry and microsatellite instability testing by PCR. The results will be reported in an add on. Comment: Although tumor is not directly involving the serosa (visceral peritoneum), tumor is less than 1 mm from the serosal surface and is associated with fibroinflammatory inflammation involving the serosa. Some studies consider this finding to represent serosal involvement.  Tumor MSI: STABLE   Ovary, left and cystic mass 11/01/2014 - MIXED ENDOMETRIOID AND CLEAR CELL CARCINOMA, FIGO GRADE III/III, OVARIAN PRIMARY. - FOCAL FALLOPIAN TUBE CARCINOMA IN SITU. - SEE ONCOLOGY TABLE BELOW.  Specimen(s): Left ovary and left fallopian tube. Procedure: (including lymph node sampling) Salpingo-oophorectomy. Primary tumor site (including laterality): Left ovary. Ovarian surface involvement: Present. Ovarian capsule intact without fragmentation: Disrupted. Maximum tumor size (cm): At least 3.7 cm. Histologic type: Mixed endometrioid and clear cell adenocarcinoma. Grade: III Peritoneal implants: (specify invasive or non-invasive): Not identified. Pelvic extension (list additional structures on separate lines and if involved): Not identified. Lymph nodes: number examined 0 ; number positive  N/A TNM code: pT1c, pNX FIGO Stage (based on pathologic findings, needs clinical correlation): IC, at least. Comments: The carcinoma present in the current specimen is morphologically dissimilar from the tumor in the patient's previous colectomy, [phone]. The tumor cells in the current case are positive for cytokeratin 7 and estrogen receptor. There is faint staining for p53. Cytokeratin 20 and CDX2 are negative. 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 There is a small focus of carcinoma in situ present in fallopian tube epithelium in random tissue. Dr Claudette Laws has reviewed the case and concurs with  the interpretation. The case was discussed with Dr Hulen Skains on 11/06/2014. (  RADIOGRAPHIC STUDIES: I have personally reviewed the radiological images as listed and agreed with the findings in the report.  Ct chest, Abdomen Pelvis W Contrast 09/13/2015  IMPRESSION: 1. No evidence of metastatic disease in the chest, abdomen or pelvis. 2. Stable postsurgical changes status post right hemicolectomy and TAH-BSO, with no evidence of local tumor recurrence. 3. Patent femoral-femoral arterial bypass graft.   ASSESSMENT & PLAN:  55 year old female with past medical history of arthritis, hysterectomy, history of abdominal bleeding status post embolization in 2005, who presented with abdominal pain nausea vomiting and weight loss. CT of abdomen showed a 3.8cm cecal mass with associated ileocecal intussusception, and a 9.1cm solid and cystic mass in the left pelvis,which was found to be a ovarian cancer. She is status post right hemicolectomy and terminal ileoectomy on 09/18/2014, and left Salpingo-oophorectomy on 11/01/14.   1. Right colon cancer, PT3pN1M0, stage III, MSI-stable  -I reviewed her CT scan and the surgical pathology findings with her in details. Based on her surgical pathology, she has stage III  disease.  -Her tumor microsatellite stability test showed stable, she unlikely has Lynch syndrome. -Althrough I recommended adjuvant chemotherapy, which is standard care for stage III colon cancer, her chemotherapy was postponed due to her multiple surgery, significance of pain after surgeries, and compliance issue (multiple no show), she was 5 months out of initial colon surgery when she returned for follow up after surgery, and the benefit of adjuvant chemotherapy was minimal at that point. So she did not receive adjuvant chemotherapy -we discussed survelliance plan, for a total of 5 years. Given the high risk of cancer recurrence, I plan to see her every 3-4 months for the first 2 years, with a repeated CT scan every 3-6 months. - I reviewed her repeated CT scan from October 2016, which showed no evidence of recurrence -she is clinically doing very well, physical exam was unremarkable, no clinical concern for recurrence. -she declined lab today, but agrees to Return next week for lab  - We'll continue cancer surveillance  2. Ovary cancer, left, pT1 cNX M0, at least stage I, mixed endometrioid and clear cell histology, grade 3, ER positive. - She underwent right right oophorectomy and  Pelvic lymph nodes biopsy for staging, which wall over negative. -She is likely need adjuvant chemotherapy, the standard therapy is carboplatin and paclitaxel for overan cancer. She is close to 3 months out of her initial ovary surgery, and she developed left lower extremity neuropathy after her recent surgery, not a great candidate for chemotherapy. I discussed with Dr. Denman George about her adjuvant adjuvant chemotherapy. Dr. Denman George feels her colon cancer is at much high risk for recurrence than ovarian cancer, and she is also quite far our from her initial ovaran cancer surgery, the benefit of chemotherapy for ovarian cancer is very limited. Due to her limited PS and pain issue, she is also not a good candidate for chemo.   -We'll follow up CA125,  Which has been normal.  3. BRCA1 mutation carrier -I previously discussed her recent genetic testing results which revealed positive BRCA1 mutation. -I previously discussed extensively about the risk of breast cancer (57-65% by age of 17), and I recommend starting screening with mammogram. She has never had a mammogram. - her screening mammogram  From September 2016 was normal -I previously discussed  prophylactic mastectomy today,  which is standard recommendation for BRCA1 mutation carrier. She became extremely upset,  anxious, started crying and had difficulty to understand why she would need more surgery. -she is going to follow up with genetic counselor Mr. Florene Glen on April 24 for follow-up. -I'll order a screening mammogram on her next visit.  4. abdominal and left lower extremity neuropathy   -Likely related to her peripheral vascular disease and a neuropathy of the surgery. -Much improved lately, she is on Lyrica  4. PVD -she well follow up with Dr. Eden Lathe   5. Coping and social support, history of depression and PTSD -She has had some difficulty coping with her synchronized colon and ovarian cancer. She lives alone, does not want her children to be involved much in her cancer care.  -her mood has much improved over time, she is coping much better lately.  Plan -RTC in 4 month for follow up with lab. Plan to order surveillance CT and screening mammogram on her next visit  All questions were answered. The patient knows to call the clinic with any problems, questions or concerns. I spent 20 minutes counseling the patient face to face. The total time spent in the appointment was 30 minutes and more than 50% was on counseling.     Truitt Merle, MD 03/10/2016

## 2016-03-10 NOTE — Telephone Encounter (Signed)
per pof to sch pt appt-gave pt copy of avs °

## 2016-03-13 ENCOUNTER — Ambulatory Visit (INDEPENDENT_AMBULATORY_CARE_PROVIDER_SITE_OTHER): Payer: Medicare Other | Admitting: Family

## 2016-03-13 ENCOUNTER — Encounter: Payer: Self-pay | Admitting: Hematology

## 2016-03-13 ENCOUNTER — Encounter: Payer: Self-pay | Admitting: Family

## 2016-03-13 ENCOUNTER — Ambulatory Visit (HOSPITAL_COMMUNITY)
Admission: RE | Admit: 2016-03-13 | Discharge: 2016-03-13 | Disposition: A | Discharge status: Home / Self Care | Payer: Medicare Other | Source: Ambulatory Visit | Attending: Family | Admitting: Family

## 2016-03-13 VITALS — BP 139/54 | HR 52 | Ht 67.0 in | Wt 176.0 lb

## 2016-03-13 DIAGNOSIS — Z48812 Encounter for surgical aftercare following surgery on the circulatory system: Secondary | ICD-10-CM

## 2016-03-13 DIAGNOSIS — F329 Major depressive disorder, single episode, unspecified: Secondary | ICD-10-CM | POA: Insufficient documentation

## 2016-03-13 DIAGNOSIS — F419 Anxiety disorder, unspecified: Secondary | ICD-10-CM | POA: Diagnosis not present

## 2016-03-13 DIAGNOSIS — I739 Peripheral vascular disease, unspecified: Secondary | ICD-10-CM | POA: Insufficient documentation

## 2016-03-13 DIAGNOSIS — I10 Essential (primary) hypertension: Secondary | ICD-10-CM | POA: Diagnosis not present

## 2016-03-13 DIAGNOSIS — R0989 Other specified symptoms and signs involving the circulatory and respiratory systems: Secondary | ICD-10-CM | POA: Diagnosis present

## 2016-03-13 DIAGNOSIS — Z95828 Presence of other vascular implants and grafts: Secondary | ICD-10-CM

## 2016-03-13 DIAGNOSIS — Z4889 Encounter for other specified surgical aftercare: Secondary | ICD-10-CM | POA: Diagnosis not present

## 2016-03-13 DIAGNOSIS — K219 Gastro-esophageal reflux disease without esophagitis: Secondary | ICD-10-CM | POA: Insufficient documentation

## 2016-03-13 NOTE — Patient Instructions (Signed)
Peripheral Vascular Disease Peripheral vascular disease (PVD) is a disease of the blood vessels that are not part of your heart and brain. A simple term for PVD is poor circulation. In most cases, PVD narrows the blood vessels that carry blood from your heart to the rest of your body. This can result in a decreased supply of blood to your arms, legs, and internal organs, like your stomach or kidneys. However, it most often affects a person's lower legs and feet. There are two types of PVD.  Organic PVD. This is the more common type. It is caused by damage to the structure of blood vessels.  Functional PVD. This is caused by conditions that make blood vessels contract and tighten (spasm). Without treatment, PVD tends to get worse over time. PVD can also lead to acute ischemic limb. This is when an arm or limb suddenly has trouble getting enough blood. This is a medical emergency. CAUSES Each type of PVD has many different causes. The most common cause of PVD is buildup of a fatty material (plaque) inside of your arteries (atherosclerosis). Small amounts of plaque can break off from the walls of the blood vessels and become lodged in a smaller artery. This blocks blood flow and can cause acute ischemic limb. Other common causes of PVD include:  Blood clots that form inside of blood vessels.  Injuries to blood vessels.  Diseases that cause inflammation of blood vessels or cause blood vessel spasms.  Health behaviors and health history that increase your risk of developing PVD. RISK FACTORS  You may have a greater risk of PVD if you:  Have a family history of PVD.  Have certain medical conditions, including:  High cholesterol.  Diabetes.  High blood pressure (hypertension).  Coronary heart disease.  Past problems with blood clots.  Past injury, such as burns or a broken bone. These may have damaged blood vessels in your limbs.  Buerger disease. This is caused by inflamed blood  vessels in your hands and feet.  Some forms of arthritis.  Rare birth defects that affect the arteries in your legs.  Use tobacco.  Do not get enough exercise.  Are obese.  Are age 50 or older. SIGNS AND SYMPTOMS  PVD may cause many different symptoms. Your symptoms depend on what part of your body is not getting enough blood. Some common signs and symptoms include:  Cramps in your lower legs. This may be a symptom of poor leg circulation (claudication).  Pain and weakness in your legs while you are physically active that goes away when you rest (intermittent claudication).  Leg pain when at rest.  Leg numbness, tingling, or weakness.  Coldness in a leg or foot, especially when compared with the other leg.  Skin or hair changes. These can include:  Hair loss.  Shiny skin.  Pale or bluish skin.  Thick toenails.  Inability to get or maintain an erection (erectile dysfunction). People with PVD are more prone to developing ulcers and sores on their toes, feet, or legs. These may take longer than normal to heal. DIAGNOSIS Your health care provider may diagnose PVD from your signs and symptoms. The health care provider will also do a physical exam. You may have tests to find out what is causing your PVD and determine its severity. Tests may include:  Blood pressure recordings from your arms and legs and measurements of the strength of your pulses (pulse volume recordings).  Imaging studies using sound waves to take pictures of   the blood flow through your blood vessels (Doppler ultrasound).  Injecting a dye into your blood vessels before having imaging studies using:  X-rays (angiogram or arteriogram).  Computer-generated X-rays (CT angiogram).  A powerful electromagnetic field and a computer (magnetic resonance angiogram or MRA). TREATMENT Treatment for PVD depends on the cause of your condition and the severity of your symptoms. It also depends on your age. Underlying  causes need to be treated and controlled. These include long-lasting (chronic) conditions, such as diabetes, high cholesterol, and high blood pressure. You may need to first try making lifestyle changes and taking medicines. Surgery may be needed if these do not work. Lifestyle changes may include:  Quitting smoking.  Exercising regularly.  Following a low-fat, low-cholesterol diet. Medicines may include:  Blood thinners to prevent blood clots.  Medicines to improve blood flow.  Medicines to improve your blood cholesterol levels. Surgical procedures may include:  A procedure that uses an inflated balloon to open a blocked artery and improve blood flow (angioplasty).  A procedure to put in a tube (stent) to keep a blocked artery open (stent implant).  Surgery to reroute blood flow around a blocked artery (peripheral bypass surgery).  Surgery to remove dead tissue from an infected wound on the affected limb.  Amputation. This is surgical removal of the affected limb. This may be necessary in cases of acute ischemic limb that are not improved through medical or surgical treatments. HOME CARE INSTRUCTIONS  Take medicines only as directed by your health care provider.  Do not use any tobacco products, including cigarettes, chewing tobacco, or electronic cigarettes. If you need help quitting, ask your health care provider.  Lose weight if you are overweight, and maintain a healthy weight as directed by your health care provider.  Eat a diet that is low in fat and cholesterol. If you need help, ask your health care provider.  Exercise regularly. Ask your health care provider to suggest some good activities for you.  Use compression stockings or other mechanical devices as directed by your health care provider.  Take good care of your feet.  Wear comfortable shoes that fit well.  Check your feet often for any cuts or sores. SEEK MEDICAL CARE IF:  You have cramps in your legs  while walking.  You have leg pain when you are at rest.  You have coldness in a leg or foot.  Your skin changes.  You have erectile dysfunction.  You have cuts or sores on your feet that are not healing. SEEK IMMEDIATE MEDICAL CARE IF:  Your arm or leg turns cold and blue.  Your arms or legs become red, warm, swollen, painful, or numb.  You have chest pain or trouble breathing.  You suddenly have weakness in your face, arm, or leg.  You become very confused or lose the ability to speak.  You suddenly have a very bad headache or lose your vision.   This information is not intended to replace advice given to you by your health care provider. Make sure you discuss any questions you have with your health care provider.   Document Released: 12/18/2004 Document Revised: 12/01/2014 Document Reviewed: 04/20/2014 Elsevier Interactive Patient Education 2016 Elsevier Inc.  

## 2016-03-13 NOTE — Progress Notes (Signed)
[facility] [place] HISTORY AND PHYSICAL -PAD  History of Present Illness Catherine Rogers is a 55 y.o. female patient of Dr. Oneida Alar who is s/p right to left femoral-femoral bypass on 04/18/2015. This was done primarily for rest pain in the left foot. She continues to have pain in the left foot despite revascularization. She also still has some edema in the left foot. She states that the foot is 70% better than preoperatively. Her incisions are well-healed. Her claudication in the left leg has completely resolved. Recent left foot x-ray showed no evidence of fracture but demineralization possibly reflex sympathetic dystrophy (RSD).  She has weaned off narcotic pain medication for her left foot. She states that her foot hurts day and night and feels cool, Lyrica is helping this. She has also noticed changes in the shape of her foot due to changes in her gait secondary to pain.  Dr. Oneida Alar last saw pt on 09/13/15. At that time patient had a duplex ultrasound 1 month prior of her femorofemoral bypass which suggested possible inflow narrowing of the femorofemoral. She had a CT angiogram of the abdomen and pelvis with lower extremity runoff today. This shows a widely patent femoral-femoral bypass with completely intact runoff to the left lower extremity without any narrowing of her lower extremity arteries. She did have mild narrowing of the right superficial femoral artery. Persistent left foot pain despite revascularization. Recent bone scan suggested reflux sympathetic dystrophy. I do not believe all of her current pain symptoms are related to vascular disease. She has previously had a neurologic evaluation which suggests that most of this pain probably is neurologic in origin from her previous extensive operation for ovarian and colon cancer. She had an extensive workup by neurology in May of 2016. She has had a referral to the pain clinic in May 2016, August 2016 and from our office in  October 2016. Dr. Oneida Alar called the pain Center in October 2016 at [phone] and spoke with their manager. Dr. Oneida Alar did not prescribe any narcotic pain medication at the October 2016 visit as he does not believe her pain symptoms were related to peripheral arterial disease. She was to follow-up with [place] in 6 months for repeat ABIs.   She returns today for 6 months follow up. Pt states she did have an appointment with the pain clinic, states she did not want to take any prescription pain medications and is not attending any longer.  She takes Lyrica, occasional Extra Strength Tylenol.  Pt states she has gained a great deal of weigh, noted that she takes Lyrica which helps her pain. Pt has multiple complaints, but states she is happy and glad she is doing as well as she is. She exercises her legs daily as prescribed by physical therapy.   Pt Diabetic: No Pt smoker: states she never smoked tobacco, currently smokes marijuana which helps with the abdominal and left leg pain and helps her sleep.  Pt meds include: Statin :No Betablocker: No ASA: No Other anticoagulants/antiplatelets: no  Past Medical History  Diagnosis Date  . Hypertension   . Ectopic pregnancy   . Head injury, closed, with brief LOC ([place])     had a cut- as a child  . Asthma   . Anxiety   . Depression   . PTSD (post-traumatic stress disorder)   . Arthritis   . Pelvic cyst   . Complication of anesthesia     woke and saw long tube coming out of mouth .  sat up then went back to sleep  . PONV (postoperative nausea and vomiting)   . Headache   . Cancer ([place])     colon and ovarian  . Anemia   . History of blood transfusion     2005 approximately   . Family history of ovarian cancer   . Family history of adverse reaction to anesthesia     sister on dialysis- hypotension   . GERD (gastroesophageal reflux disease)     occasional  . PAD (peripheral artery disease) ([place]) 04/18/2015    Social History Social History   Substance Use Topics  . Smoking status: Never Smoker   . Smokeless tobacco: Never Used     Comment: pack per month of cigars = 5  . Alcohol Use: No     Comment: socially     Family History Family History  Problem Relation Age of Onset  . Brain cancer Sister 81  . Lung cancer Brother 42  . Heart attack Mother   . Heart attack Father     97s  . Heart attack Maternal Uncle   . Diabetes Maternal Uncle   . Ovarian cancer Paternal Aunt     3 paternal aunts with ovarian cancer  . Diabetes Paternal Uncle   . Heart attack Brother     Past Surgical History  Procedure Laterality Date  . Esophagogastroduodenoscopy N/A 09/17/2014    Procedure: ESOPHAGOGASTRODUODENOSCOPY (EGD);  Surgeon: Jeryl Columbia, MD;  Location: [facility] ENDOSCOPY;  Service: Endoscopy;  Laterality: N/A;  . Abdominal hysterectomy    . Tubal ligation    . Partial colectomy Right 09/18/2014    Procedure: RIGHT HEMI COLECTOMY;  Surgeon: Doreen Salvage, MD;  Location: [place] [facility];  Service: General;  Laterality: Right;  . Ileostomy Right 09/18/2014    Procedure: ILEOCOLOSTOMY;  Surgeon: Doreen Salvage, MD;  Location: [place];  Service: General;  Laterality: Right;  . Laparotomy N/A 11/01/2014    Procedure: EXPLORATORY LAPAROTOMY;  Surgeon: Doreen Salvage, MD;  Location: [place];  Service: General;  Laterality: N/A;  . Mass excision  11/01/2014    Procedure: EXCISION PELVIC MASS;  Surgeon: Doreen Salvage, MD;  Location: [place];  Service: General;;  . Robotic assisted total hysterectomy with bilateral salpingo oopherectomy Right 12/26/2014    Procedure: EXPLORATORY LAPAROSCOPY  RIGHT SALPINGO OOPHORECTOMY OMENTECTOMY LYMPHADECTOMY ;  Surgeon: Everitt Amber, MD;  Location: [facility] ORS;  Service: Gynecology;  Laterality: Right;  . Laparotomy N/A 12/26/2014    Procedure:  LAPAROTOMY ;  Surgeon: Everitt Amber, MD;  Location: [facility];  Service: Gynecology;  Laterality: N/A;  . Oophorectomy  2016  . Colonoscopy    . Femoral-femoral bypass graft Bilateral 04/18/2015    Procedure:   RIGHT COMMON FEMORAL TO LEFT COMMON  FEMORAL  ARTERY BYPASS WITH  8 MM HEMASHIELD GRAFT,RIGHT COMMON SUPERFICIAL FEMORAL ARTERY ENDARTERECTOMY;  Surgeon: Elam Dutch, MD;  Location: [facility] OR;  Service: Vascular;  Laterality: Bilateral;    Allergies  Allergen Reactions  . Other Hives and Itching    Highly acidic foods "make me itch and break out" oranges, tomatoes  . Demerol [Meperidine] Nausea And Vomiting  . Morphine And Related Nausea And Vomiting  . Oxycodone Itching  . Penicillins Other (See Comments)    Childhood allergy  . Tylenol [Acetaminophen] Nausea And Vomiting  . Ibuprofen Nausea And Vomiting    Current Outpatient Prescriptions  Medication Sig Dispense Refill  . LYRICA 200 MG capsule take 1 capsule by mouth every 8 hours as  directed by prescriber 90 capsule 5  . docusate sodium 100 MG CAPS Take 100 mg by mouth 2 (two) times daily as needed for mild constipation. (Patient not taking: Reported on 03/13/2016) 10 capsule 0   No current facility-administered medications for this visit.    ROS: See HPI for pertinent positives and negatives.   Physical Examination  Filed Vitals:   03/13/16 1036 03/13/16 1041  BP: 154/95 139/54  Pulse: 52   Height: '5\' 7"'$  (1.702 m)   Weight: 176 lb (79.833 kg)   SpO2: 98%    Body mass index is 27.56 kg/(m^2).  General: A&O x 3, WDWN. Gait: normal Eyes: PERRLA. Pulmonary: Respirations are non labored, CTAB, without wheezes , rales or rhonchi. Cardiac: regular rhythm, no detected murmur.         Carotid Bruits Right Left   Negative Negative  Aorta is not palpable. Radial pulses: 1+ palpable and =  Palpable fem-fem bypass graft pulse                          VASCULAR EXAM: Extremities without ischemic changes, without Gangrene; without open wounds.                                                                                                          LE Pulses Right Left       FEMORAL  not palpable  not palpable         POPLITEAL  not palpable   not palpable       POSTERIOR TIBIAL  not palpable   not palpable        DORSALIS PEDIS      ANTERIOR TIBIAL not palpable  not palpable    Abdomen: soft, NT, no palpable masses. Skin: no rashes, no ulcers. Musculoskeletal: no muscle wasting or atrophy other than mild deformity of left foot.  Neurologic: A&O X 3; Appropriate Affect ; SENSATION: normal; MOTOR FUNCTION:  moving all extremities equally, motor strength 5/5 throughout. Speech is fluent/normal.  CN 2-12 intact.    Non-Invasive Vascular Imaging: DATE: 03/13/2016 ABI: RIGHT: 0.78 (0.63, 07/05/15), Waveforms: monophasic, TBI: 0.74;  LEFT: 0.84 (0.66), Waveforms: monophasic, TBI: 0.67   ASSESSMENT: Catherine Rogers is a 55 y.o. female who is s/p right to left femoral-femoral bypass on 04/18/2015. This was done primarily for rest pain in the left foot. She continues to have pain in the left foot despite revascularization.  She states that the foot is 70% better than preoperatively. Her incisions are well-healed. Her claudication in the left leg has completely resolved. Pt reports that she has been diligent about exercising daily as prescribed by physical therapy. She has weaned herself off narcotics and takes Lyrica with some degree of pain relief of the left leg and abdomen.   Bilateral ABI's are improved from August 2016: moderate arterial occlusive disease in the right leg and mild in the left with monophasic waveforms. TBI is normal on the right, almost normal on the left.   PLAN:  Continue excellent daily exercise  habits as instructed by physical therapy.  Based on the patient's vascular studies and examination, pt will return to clinic in 6 months with ABI's.  I discussed in depth with the patient the nature of atherosclerosis, and emphasized the importance of maximal medical management including strict control of blood pressure, blood glucose, and lipid levels, obtaining regular exercise, and  cessation of smoking.  The patient is aware that without maximal medical management the underlying atherosclerotic disease process will progress, limiting the benefit of any interventions.  The patient was given information about PAD including signs, symptoms, treatment, what symptoms should prompt the patient to seek immediate medical care, and risk reduction measures to take.  Clemon Chambers, RN, MSN, FNP-C [facility] [employer] Phone: [phone]  Clinic MD: [facility]  03/13/2016 10:45 AM

## 2016-03-17 ENCOUNTER — Encounter: Payer: Self-pay | Admitting: Genetic Counselor

## 2016-03-17 ENCOUNTER — Other Ambulatory Visit (HOSPITAL_BASED_OUTPATIENT_CLINIC_OR_DEPARTMENT_OTHER): Payer: Medicare Other

## 2016-03-17 ENCOUNTER — Ambulatory Visit (HOSPITAL_BASED_OUTPATIENT_CLINIC_OR_DEPARTMENT_OTHER): Payer: Medicare Other | Admitting: Genetic Counselor

## 2016-03-17 DIAGNOSIS — C562 Malignant neoplasm of left ovary: Secondary | ICD-10-CM | POA: Diagnosis not present

## 2016-03-17 DIAGNOSIS — Z801 Family history of malignant neoplasm of trachea, bronchus and lung: Secondary | ICD-10-CM | POA: Diagnosis not present

## 2016-03-17 DIAGNOSIS — Z1501 Genetic susceptibility to malignant neoplasm of breast: Secondary | ICD-10-CM

## 2016-03-17 DIAGNOSIS — C182 Malignant neoplasm of ascending colon: Secondary | ICD-10-CM | POA: Diagnosis not present

## 2016-03-17 DIAGNOSIS — Z808 Family history of malignant neoplasm of other organs or systems: Secondary | ICD-10-CM

## 2016-03-17 DIAGNOSIS — Z1509 Genetic susceptibility to other malignant neoplasm: Secondary | ICD-10-CM

## 2016-03-17 DIAGNOSIS — Z8041 Family history of malignant neoplasm of ovary: Secondary | ICD-10-CM

## 2016-03-17 DIAGNOSIS — Z315 Encounter for genetic counseling: Secondary | ICD-10-CM

## 2016-03-17 DIAGNOSIS — Z1379 Encounter for other screening for genetic and chromosomal anomalies: Secondary | ICD-10-CM

## 2016-03-17 LAB — COMPREHENSIVE METABOLIC PANEL
ALK PHOS: 82 U/L (ref 40–150)
ALT: 15 U/L (ref 0–55)
ANION GAP: 9 meq/L (ref 3–11)
AST: 23 U/L (ref 5–34)
Albumin: 3.8 g/dL (ref 3.5–5.0)
BILIRUBIN TOTAL: 0.62 mg/dL (ref 0.20–1.20)
BUN: 12.3 mg/dL (ref 7.0–26.0)
CO2: 26 meq/L (ref 22–29)
Calcium: 9.4 mg/dL (ref 8.4–10.4)
Chloride: 108 mEq/L (ref 98–109)
Creatinine: 0.9 mg/dL (ref 0.6–1.1)
EGFR: 87 mL/min/{1.73_m2} — AB (ref >90)
Glucose: 85 mg/dl (ref 70–140)
Potassium: 3.9 mEq/L (ref 3.5–5.1)
Sodium: 143 mEq/L (ref 136–145)
TOTAL PROTEIN: 7.6 g/dL (ref 6.4–8.3)

## 2016-03-17 LAB — CBC WITH DIFFERENTIAL/PLATELET
BASO%: 0.5 % (ref 0.0–2.0)
Basophils Absolute: 0 10*3/uL (ref 0.0–0.1)
EOS ABS: 0 10*3/uL (ref 0.0–0.5)
EOS%: 0.7 % (ref 0.0–7.0)
HCT: 42.4 % (ref 34.8–46.6)
HEMOGLOBIN: 14.5 g/dL (ref 11.6–15.9)
LYMPH%: 43.3 % (ref 14.0–49.7)
MCH: 35.8 pg — ABNORMAL HIGH (ref 25.1–34.0)
MCHC: 34.2 g/dL (ref 31.5–36.0)
MCV: 104.8 fL — AB (ref 79.5–101.0)
MONO#: 0.4 10*3/uL (ref 0.1–0.9)
MONO%: 6.8 % (ref 0.0–14.0)
NEUT%: 48.7 % (ref 38.4–76.8)
NEUTROS ABS: 3 10*3/uL (ref 1.5–6.5)
PLATELETS: 136 10*3/uL — AB (ref 145–400)
RBC: 4.05 10*6/uL (ref 3.70–5.45)
RDW: 12.5 % (ref 11.2–14.5)
WBC: 6.2 10*3/uL (ref 3.9–10.3)
lymph#: 2.7 10*3/uL (ref 0.9–3.3)

## 2016-03-17 NOTE — Progress Notes (Signed)
GENETIC TEST RESULTS   Patient Name: Catherine Rogers Patient Age: 55 y.o. Encounter Date: 03/17/2016  Referring Provider: Truitt Merle, MD    Ms. Radman was seen in the [place] clinic on March 17, 2016 due to a personal and family history of cancer and concern regarding a hereditary predisposition to cancer in the family. Please refer to the prior Genetics clinic note for more information regarding Ms. Senske's medical and family histories and our assessment at the time.   FAMILY HISTORY:  We obtained a detailed, 4-generation family history.  Significant diagnoses are listed below: Family History  Problem Relation Age of Onset  . Brain cancer Sister 81  . Lung cancer Brother 56  . Heart attack Mother   . Heart attack Father     19s  . Heart attack Maternal Uncle   . Diabetes Maternal Uncle   . Ovarian cancer Paternal Aunt     3 paternal aunts with ovarian cancer  . Diabetes Paternal Uncle   . Heart attack Brother     The patient had one sister and six brothers. One brother had lung cancer, two died of heart attacks and her sister has a brain tumor. The maternal side is non-contributory. Her father had 36 brothers and 3 sisters. All three sisters had ovarian cancer. Patient's maternal ancestors are of African American descent, and paternal ancestors are of African American descent. There is no reported Ashkenazi Jewish ancestry. There is no known consanguinity.  GENETIC TESTING:  At the time of Ms. Beckles's visit, we recommended she pursue genetic testing of the CancerNext genes. The CancerNext gene panel offered by [employer] includes sequencing and rearrangement analysis for the following 32 genes:   APC, ATM, BARD1, BMPR1A, BRCA1, BRCA2, BRIP1, CDH1, CDK4, CDKN2A, CHEK2, EPCAM, GREM1, MLH1, MRE11A, MSH2, MSH6, MUTYH, NBN, NF1, PALB2, PMS2, POLD1, POLE, PTEN, RAD50, RAD51D, SMAD4, SMARCA4, STK11, and TP53.   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  Genetic testing did detect two Variants of Unknown Significance one in the ATM gene called c.1685A>G and another in the MSH6 gene called c.16A>C.  At this time, it is unknown if these variants are associated with increased cancer risk or if this is a normal finding, but most variants such as this get reclassified to being inconsequential. It should not be used to make medical management decisions. With time, we suspect the lab will determine the significance of this variant, if any. If we do learn more about it, we will try to contact Ms. Chopin to discuss it further. However, it is important to stay in touch with [place] periodically and keep the address and phone number up to date.   MEDICAL MANAGEMENT: Women who have a BRCA mutation have an increased risk for both breast and ovarian cancer.   As discussed with Ms. Mccuen, to reduce the risk for breast cancer, prophylactic bilateral mastectomy is the most effective option. However, for women who choose to keep their breasts, we recommend yearly mammograms, yearly breast MRI, twice-yearly clinical breast exams through a high-risk clinic, and monthly self-breast exams.   FAMILY MEMBERS: It is important that all of Ms. Milhorn's relatives (both men and women) know of the presence of this gene mutation. Site-specific genetic testing can sort out who in the family is at risk and who is not.   Ms. Paone children and siblings have a 50% chance to have inherited this mutation. We recommend they have genetic testing for this same mutation, as  identifying the presence of this mutation would allow them to also take advantage of risk-reducing measures.   SUPPORT AND RESOURCES: Ms. Poynter had some concerning statements about how she "just did not care anymore" and that she "thought about death all the time".  She is feeling overwhelmed by her previous cancer diagnosis and the prospect of possibly having cancer again.  Ms. Mancini spoke with Polo Riley,  who provided her information about support services as well as healing arts that Ms. Buelna seemed interested in (specifically creative writing).  If Ms. Kreps is interested in BRCA-specific information and support, Facing Our Risk (www.facingourrisk.com) is one group some people have found useful. They provide opportunities to speak with other individuals from high-risk families. To locate genetic counselors in other cities, visit the website of the [employer] (ArtistMovie.se) and Secretary/administrator for a Social worker by zip code.  We encouraged Ms. Janosik to remain in contact with [place] on an annual basis so we can update her personal and family histories, and let her know of advances in cancer genetics that may benefit the family. Our contact number was provided. Ms. Dedic questions were answered to her satisfaction today, and she knows she is welcome to call anytime with additional questions.   Sienna Stonehocker P. Florene Glen, [place], [facility] Certified Genetic Counselor [email or web address] phone: [phone]

## 2016-03-18 ENCOUNTER — Encounter: Payer: Self-pay | Admitting: Genetic Counselor

## 2016-03-18 ENCOUNTER — Encounter: Payer: Self-pay | Admitting: *Deleted

## 2016-03-18 LAB — CEA: CEA1: 2.1 ng/mL (ref 0.0–4.7)

## 2016-03-18 LAB — CEA (PARALLEL TESTING): CEA: 1 ng/mL

## 2016-03-18 NOTE — Progress Notes (Signed)
LATE NOTE: 03/17/16  [place] Clinical Social Work  Clinical Social Work received request from Catherine Rogers, Dietitian, to meet with patient in genetic counseling office after consult, patient reported feeling hopeless after learning she has BRCA genetic mutation.  The patient shared with CSW and genetic counselor that she "just doesn't care anymore" and she is "just waiting to die" due to previous diagnoses of ovarian and colon cancer and now increased risk for breast/ovarian cancer.  Catherine Rogers reported she has thought about harming herself years ago, but has no harmful or suicidal thoughts at this time.  CSW validated patient's feelings of loss of control, sadness, and anxiety.  Catherine Rogers reported she has dealt with depression/anxiety chronically but is not open to  medication or receive counseling because "it just doesn't work, they just listen and want a pill to fix it".  CSW provided brief emotional support and explored current positive coping skills patient is already using.  The patient reported she enjoys writing and has practiced journaling for many years as an outlet to release her thoughts and emotions.  Catherine Rogers enjoys playing pool, stated she has been playing all her life; feels it is a way to "destress" and has a calming effect.  CSW provided patient with information regarding support and interventions.  CSW provided information for [facility] services- patient agreed to contact the crisis hotline if she has thoughts of hurting herself or others.  CSW explained [facility] also offers counseling services.  Patient's affect improved when CSW shared "Writing to Heal" classes offered at [facility]- patient plans to attend upcoming class.    CSW encouraged patient to call with any questions or concerns.  Catherine Rogers, MSW, LCSW, OSW-C Clinical Social Worker [facility] [phone]

## 2016-03-24 ENCOUNTER — Telehealth: Payer: Self-pay | Admitting: Physical Medicine & Rehabilitation

## 2016-03-24 NOTE — Telephone Encounter (Signed)
Patient having issues getting her Lyrica filled through her pharmacy [facility] in [place]) please call patient at [phone].

## 2016-03-25 NOTE — Telephone Encounter (Signed)
Attempted to reach Mrs Crotteau.  Left message to call the office.

## 2016-03-25 NOTE — Telephone Encounter (Signed)
I spoke with [facility] and the person that filled the RX put it in as #90 was a 54 day RX.  It should be 30 day.  She is correcting this and Elisheva should be able to fill. This was a pharmacy error.

## 2016-04-18 NOTE — Addendum Note (Signed)
Addended by: Mena Goes on: 04/18/2016 01:29 PM   Modules accepted: Orders

## 2016-04-24 ENCOUNTER — Encounter: Payer: Self-pay | Admitting: Nutrition

## 2016-04-24 NOTE — Progress Notes (Signed)
Patient requesting coupons for oral nutrition supplements. Mailed them to her home address.

## 2016-06-19 ENCOUNTER — Ambulatory Visit: Payer: Medicare Other | Admitting: Physical Medicine & Rehabilitation

## 2016-06-19 ENCOUNTER — Encounter: Payer: Medicare Other | Attending: Physical Medicine & Rehabilitation

## 2016-07-10 ENCOUNTER — Other Ambulatory Visit (HOSPITAL_BASED_OUTPATIENT_CLINIC_OR_DEPARTMENT_OTHER): Payer: Medicare Other

## 2016-07-10 ENCOUNTER — Encounter: Payer: Self-pay | Admitting: Hematology

## 2016-07-10 ENCOUNTER — Ambulatory Visit (HOSPITAL_BASED_OUTPATIENT_CLINIC_OR_DEPARTMENT_OTHER): Payer: Medicare Other | Admitting: Hematology

## 2016-07-10 ENCOUNTER — Telehealth: Payer: Self-pay | Admitting: Hematology

## 2016-07-10 VITALS — BP 169/87 | HR 84 | Temp 98.3°F | Resp 18 | Ht 67.0 in | Wt 192.0 lb

## 2016-07-10 DIAGNOSIS — I739 Peripheral vascular disease, unspecified: Secondary | ICD-10-CM

## 2016-07-10 DIAGNOSIS — Z148 Genetic carrier of other disease: Secondary | ICD-10-CM | POA: Diagnosis not present

## 2016-07-10 DIAGNOSIS — G629 Polyneuropathy, unspecified: Secondary | ICD-10-CM

## 2016-07-10 DIAGNOSIS — C182 Malignant neoplasm of ascending colon: Secondary | ICD-10-CM

## 2016-07-10 DIAGNOSIS — Z1231 Encounter for screening mammogram for malignant neoplasm of breast: Secondary | ICD-10-CM

## 2016-07-10 DIAGNOSIS — Z1501 Genetic susceptibility to malignant neoplasm of breast: Secondary | ICD-10-CM

## 2016-07-10 DIAGNOSIS — C562 Malignant neoplasm of left ovary: Secondary | ICD-10-CM | POA: Diagnosis not present

## 2016-07-10 DIAGNOSIS — Z1509 Genetic susceptibility to other malignant neoplasm: Secondary | ICD-10-CM

## 2016-07-10 DIAGNOSIS — Z780 Asymptomatic menopausal state: Secondary | ICD-10-CM

## 2016-07-10 DIAGNOSIS — F419 Anxiety disorder, unspecified: Secondary | ICD-10-CM

## 2016-07-10 LAB — CBC WITH DIFFERENTIAL/PLATELET
BASO%: 0.4 % (ref 0.0–2.0)
Basophils Absolute: 0 10*3/uL (ref 0.0–0.1)
EOS ABS: 0.1 10*3/uL (ref 0.0–0.5)
EOS%: 1.1 % (ref 0.0–7.0)
HCT: 41.5 % (ref 34.8–46.6)
HEMOGLOBIN: 14.2 g/dL (ref 11.6–15.9)
LYMPH%: 41.1 % (ref 14.0–49.7)
MCH: 35 pg — ABNORMAL HIGH (ref 25.1–34.0)
MCHC: 34.3 g/dL (ref 31.5–36.0)
MCV: 102 fL — AB (ref 79.5–101.0)
MONO#: 0.5 10*3/uL (ref 0.1–0.9)
MONO%: 9.9 % (ref 0.0–14.0)
NEUT%: 47.5 % (ref 38.4–76.8)
NEUTROS ABS: 2.6 10*3/uL (ref 1.5–6.5)
Platelets: 178 10*3/uL (ref 145–400)
RBC: 4.07 10*6/uL (ref 3.70–5.45)
RDW: 13.4 % (ref 11.2–14.5)
WBC: 5.5 10*3/uL (ref 3.9–10.3)
lymph#: 2.3 10*3/uL (ref 0.9–3.3)

## 2016-07-10 LAB — COMPREHENSIVE METABOLIC PANEL
ALBUMIN: 3.7 g/dL (ref 3.5–5.0)
ALK PHOS: 95 U/L (ref 40–150)
ALT: 11 U/L (ref 0–55)
AST: 20 U/L (ref 5–34)
Anion Gap: 9 mEq/L (ref 3–11)
BILIRUBIN TOTAL: 0.55 mg/dL (ref 0.20–1.20)
BUN: 13.2 mg/dL (ref 7.0–26.0)
CO2: 27 mEq/L (ref 22–29)
CREATININE: 0.9 mg/dL (ref 0.6–1.1)
Calcium: 9.4 mg/dL (ref 8.4–10.4)
Chloride: 105 mEq/L (ref 98–109)
EGFR: 89 mL/min/{1.73_m2} — ABNORMAL LOW (ref >90)
GLUCOSE: 92 mg/dL (ref 70–140)
Potassium: 3.7 mEq/L (ref 3.5–5.1)
SODIUM: 141 meq/L (ref 136–145)
TOTAL PROTEIN: 7.8 g/dL (ref 6.4–8.3)

## 2016-07-10 NOTE — Telephone Encounter (Signed)
Gv pt appt for 11/06/16.

## 2016-07-10 NOTE — Progress Notes (Signed)
[place]  [phone] [phone]  Clinic follow Up Note   Patient Care Team: Golden Circle, FNP as PCP - General (Family Medicine) Truitt Merle, MD as Consulting Physician (Hematology) Judeth Horn, MD as Consulting Physician (General Surgery) Nat Math, MD as Referring Physician (Obstetrics and Gynecology) Everitt Amber, MD as Consulting Physician (Obstetrics and Gynecology) Elam Dutch, MD as Consulting Physician (Vascular Surgery) 07/10/2016   CHIEF COMPLAINTS/PURPOSE OF CONSULTATION:  1. Colon cancer, pT3N1M0, stage III 2. Ovarian cancer, pT1cNxM0, stage I  3. BRCA1 mutation (+)    Cancer of right colon ([place])   09/16/2014 Imaging    CT abd/pel:  a 3.8cm cecal mass with associated ileocecal intussusception, and a 9.1cm solid and cystic mass in the left pelvis,     09/18/2014 Initial Diagnosis    Adenocarcinoma of right colon     09/18/2014 Pathologic Stage    pT3pN1Mx, tumor extends into pericolonic soft tissue and is less than 64m from the serosal surface, LVI 8-), PNI (-), all 23 node negative, one soft tissue tumor deposit, surgical margins negative.      09/18/2014 Surgery    right hemicolectomy and terminal ileoectomy      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 ([place]   10/09/2014 Imaging    CT of the abdomen and pelvis showed a 9.1 cm solid and cystic mass in the left pelvis concerning for malignancy. This was discovered during her workup for colon cancer.     11/01/2014 Initial Diagnosis    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     11/01/2014 Surgery    Salpingo-oophorectomy.     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 Tumor size 3.7 cm, no lymph nodes removed. T1cNx. Tumor cells are positive for cytokeratin 7 and estrogen receptor.       HISTORY OF PRESENTING ILLNESS:  [name]. female is here because of recently  diagnosed colon cancer.   She presented with abdominal pain, nausea and vomiting, hemoptoses in Oct 2015.  [facility] She had [facility] CT scan that revealed a 3.8cm cecal mass with associated ileocecal intussusception, and a 9.1cm solid and cystic mass in the left pelvis, likely a peritoneal met with ascites.  EGD was negative. She had exploratory laparoscopy on 09/18/2014 by Dr. WHulen Skains and underwent right hemicolectomy and ileocecectomy. The pathology reviewed also related an invasive adenocarcinoma at the terminal ileum and cecum. She was discharged home on 09/22/2014.  She underwent a second surgery for left pelvis cystic mass resection on 11/01/2014, surgical path showed ovarian cancer.  CURRENT TREATMENT:   Surveillance  INTERIM HISTORY: TTriniareturns for follow-up. She is doing well overall, has gained about 20 lbs in the past 4 months. She still has intermittent left leg pain, especially when she walks. She is on lyrica, she does not think it helps. She denies any other new pain, abdominal discomfort, nausea, or change of her bowel habits.  MEDICAL HISTORY:  Past Medical History:  Diagnosis Date  . Anemia   . Anxiety   . Arthritis   . Asthma   . [place]    colon and ovarian  . Complication of anesthesia    woke and saw long tube coming out of mouth . sat up then went back to sleep  . Depression   .  Ectopic pregnancy   . Family history of adverse reaction to anesthesia    sister on dialysis- hypotension   . Family history of ovarian cancer   . GERD (gastroesophageal reflux disease)    occasional  . Head injury, closed, with brief LOC ([place])    had a cut- as a child  . Headache   . History of blood transfusion    2005 approximately   . Hypertension   . PAD (peripheral artery disease) ([place]) 04/18/2015  . Pelvic cyst   . PONV (postoperative nausea and  vomiting)   . PTSD (post-traumatic stress disorder)   PTSD   SURGICAL HISTORY: Past Surgical History:  Procedure Laterality Date  . ABDOMINAL HYSTERECTOMY    . COLONOSCOPY    . ESOPHAGOGASTRODUODENOSCOPY N/A 09/17/2014   Procedure: ESOPHAGOGASTRODUODENOSCOPY (EGD);  Surgeon: Jeryl Columbia, MD;  Location[facility];  Service: Endoscopy;  Laterality: N/A;  . FEMORAL-FEMORAL BYPASS GRAFT Bilateral 04/18/2015   Procedure:  RIGHT COMMON FEMORAL TO LEFT COMMON  FEMORAL  ARTERY BYPASS WITH  8 MM HEMASHIELD GRAFT,RIGHT COMMON SUPERFICIAL FEMORAL ARTERY ENDARTERECTOMY;  Surgeon: Elam Dutch, MD;  Location: [place];  Service: Vascular;  Laterality: Bilateral;  . ILEOSTOMY Right 09/18/2014   Procedure: ILEOCOLOSTOMY;  Surgeon: Doreen Salvage, MD;  Location: [place];  Service: General;  Laterality: Right;  . LAPAROTOMY N/A 11/01/2014   Procedure: EXPLORATORY LAPAROTOMY;  Surgeon: Doreen Salvage, MD;  Location: [place];  Service: General;  Laterality: N/A;  . LAPAROTOMY N/A 12/26/2014   Procedure:  LAPAROTOMY ;  Surgeon: Everitt Amber, MD;  Location: [facility];  Service: Gynecology;  Laterality: N/A;  . MASS EXCISION  11/01/2014   Procedure: EXCISION PELVIC MASS;  Surgeon: Doreen Salvage, MD;  Location: [place];  Service: General;;  . OOPHORECTOMY  2016  . PARTIAL COLECTOMY Right 09/18/2014   Procedure: RIGHT HEMI COLECTOMY;  Surgeon: Doreen Salvage, MD;  Location: [place];  Service: General;  Laterality: Right;  . ROBOTIC ASSISTED TOTAL HYSTERECTOMY WITH BILATERAL SALPINGO OOPHERECTOMY Right 12/26/2014   Procedure: EXPLORATORY LAPAROSCOPY  RIGHT SALPINGO OOPHORECTOMY OMENTECTOMY LYMPHADECTOMY ;  Surgeon: Everitt Amber, MD;  Location: [facility];  Service: Gynecology;  Laterality: Right;  . TUBAL LIGATION      SOCIAL HISTORY: Social History   Social History  . Marital status: Widowed    Spouse name: N/A  . Number of children: 6  . Years of education: 75   Occupational History  . Disability    Social History Main Topics  . Smoking  status: Never Smoker  . Smokeless tobacco: Never Used     Comment: pack per month of cigars = 5  . Alcohol use No     Comment: socially   . Drug use:     Frequency: 2.0 times per week    Types: Marijuana     Comment: 04/17/15- 2 weeks ago  . Sexual activity: No   Other Topics Concern  . Not on file   Social History Narrative   Born and raised in [place], [place]. Currently resides in a house by herself. No pets. Fun: Read, shoot pool.    Denies religious beliefs that would effect health care.    Lives at home alone.   Right-handed.   No caffeine use.    FAMILY HISTORY: Family History  Problem Relation Age of Onset  . Brain cancer Sister 65  . Lung cancer Brother 31  . Heart attack Mother   . Heart attack Father  75s  . Heart attack Maternal Uncle   . Diabetes Maternal Uncle   . Ovarian cancer Paternal Aunt     3 paternal aunts with ovarian cancer  . Diabetes Paternal Uncle   . Heart attack Brother     ALLERGIES:  is allergic to other; demerol [meperidine]; morphine and related; oxycodone; penicillins; tylenol [acetaminophen]; and ibuprofen.  MEDICATIONS:  Current Outpatient Prescriptions  Medication Sig Dispense Refill  . docusate sodium 100 MG CAPS Take 100 mg by mouth 2 (two) times daily as needed for mild constipation. 10 capsule 0  . LYRICA 200 MG capsule take 1 capsule by mouth every 8 hours as directed by prescriber 90 capsule 5   No current facility-administered medications for this visit.     REVIEW OF SYSTEMS:   Constitutional: Denies fevers, chills or abnormal night sweats, (+) weight loss and fatigue  Eyes: Denies blurriness of vision, double vision or watery eyes Ears, nose, mouth, throat, and face: Denies mucositis or sore throat Respiratory: Denies cough, (+) dyspnea on exertion, no wheezes Cardiovascular: Denies palpitation, chest discomfort or lower extremity swelling Gastrointestinal:no nausea or abdominal pain, no heartburn or change in bowel  habits Skin: Denies abnormal skin rashes Lymphatics: Denies new lymphadenopathy or easy bruising Neurological:see HPI  Behavioral/Psych: Mood is stable, no new changes  All other systems were reviewed with the patient and are negative.  PHYSICAL EXAMINATION: ECOG PERFORMANCE STATUS: 1  Vitals:   07/10/16 1309  BP: (!) 169/87  Pulse: 84  Resp: 18  Temp: 98.3 F (36.8 C)   Filed Weights   07/10/16 1309  Weight: 192 lb (87.1 kg)    GENERAL:alert, slightly distressed due to her pain SKIN: skin color, texture, turgor are normal, no rashes or significant lesions EYES: normal, conjunctiva are pink and non-injected, sclera clear OROPHARYNX:no exudate, no erythema and lips, buccal mucosa, and tongue normal  NECK: supple, thyroid normal size, non-tender, without nodularity LYMPH:  no palpable lymphadenopathy in the cervical, axillary or inguinal LUNGS: clear to auscultation and percussion with normal breathing effort HEART: regular rate & rhythm and no murmurs and no lower extremity edema ABDOMEN:abdomen soft, (+) tenderness at middle and low abdomen with gentle palpitation, no rebound pain, normal bowel sounds, no organmegaly. She has a stable left over at the surgical scar, which was removed by my nurse today. Musculoskeletal:no cyanosis of digits and no clubbing  PSYCH: alert & oriented x 3 with fluent speech NEURO: no focal neuro deficits. EXT: (+)   Skin pigmentation in the left foot, no edema   LABORATORY DATA:  I have reviewed the data as listed CBC Latest Ref Rng & Units 07/10/2016 03/17/2016 11/01/2015  WBC 3.9 - 10.3 10e3/uL 5.5 6.2 7.9  Hemoglobin 11.6 - 15.9 g/dL 14.2 14.5 13.9  Hematocrit 34.8 - 46.6 % 41.5 42.4 39.3  Platelets 145 - 400 10e3/uL 178 136(L) 131(L)   CMP Latest Ref Rng & Units 07/10/2016 03/17/2016 11/01/2015  Glucose 70 - 140 mg/dl 92 85 71  BUN 7.0 - 26.0 mg/dL 13.2 12.3 21.0  Creatinine 0.6 - 1.1 mg/dL 0.9 0.9 0.9  Sodium 136 - 145 mEq/L 141 143 140    Potassium 3.5 - 5.1 mEq/L 3.7 3.9 4.2  Chloride 101 - 111 mmol/L - - -  CO2 22 - 29 mEq/L 27 26 25   Calcium 8.4 - 10.4 mg/dL 9.4 9.4 9.2  Total Protein 6.4 - 8.3 g/dL 7.8 7.6 7.2  Total Bilirubin 0.20 - 1.20 mg/dL 0.55 0.62 0.51  Alkaline Phos  40 - 150 U/L 95 82 88  AST 5 - 34 U/L 20 23 26   ALT 0 - 55 U/L 11 15 26    Results for BRYNDA, HEICK (MRN [id number]) as of 07/14/2016 17:19  Ref. Range 03/17/2016 14:47 07/10/2016 12:51  CA 125 Latest Ref Range: <35 U/mL  7  Cancer Antigen (CA) 125 Latest Ref Range: 0.0 - 38.1 U/mL  6.0  CEA Latest Units: ng/mL 1.0   CEA Latest Ref Range: 0.0 - 4.7 ng/mL 2.1     PATHOLOGY REPORT 09/18/2014 Colon, segmental resection for tumor, terminal ileum and right - ULCERATED AND INVASIVE ADENOCARCINOMA SEE COMMENT. - NEGATIVE FOR LYMPH VASCULAR INVASION - NEGATIVE FOR PERINEURAL INVASION. 1 of 4 FINAL for Oneil, Evalyne [phone]) Diagnosis(continued) - TUMOR EXTENDS INTO PERICOLONIC SOFT TISSUE AND IS LESS THAN 1 MM FROM THE SEROSAL SURFACE. - TWENTY-THREE LYMPH NODES, NEGATIVE FOR TUMOR (0/23) - ONE SOFT TISSUE TUMOR DEPOSIT. - SURGICAL MARGINS, NEGATIVE FOR TUMOR. Microscopic Comment COLON AND RECTUM (INCLUDING TRANS-ANAL RESECTION): Specimen: Terminal ileum and right colon. Procedure: Right colectomy. Tumor site: Cecum. Specimen integrity: Intact. Macroscopic intactness of mesorectum: N/A Macroscopic tumor perforation: Absent. Invasive tumor: Maximum size: 4.7 cm Histologic type(s): Adenocarcinoma Histologic grade and differentiation: G2: moderately differentiated/low grade Type of polyp in which invasive carcinoma arose: Tubular adenoma with high grade dysplasia Microscopic extension of invasive tumor: Tumor extends into pericolonic soft tissue, see comment. Lymph-Vascular invasion: Absent. Peri-neural invasion: Absent. Tumor deposit(s) (discontinuous extramural extension): (x1) Resection margins: Proximal margin:  Negative. Distal margin: Negative. Circumferential (radial) (posterior ascending, posterior descending; lateral and posterior mid-rectum; and entire lower 1/3 rectum): Negative Mesenteric margin (sigmoid and transverse): N/A Distance closest margin (if all above margins negative): 3.3 cm (distal) Trans-anal resection margins only: Deep margin: N/A Mucosal Margin: N/A Distance closest mucosal margin (if negative): N/A Treatment effect (neo-adjuvant therapy): None. Additional polyp(s): None. Non-neoplastic findings: None Lymph nodes: number examined- 23; number positive: 0 Pathologic Staging: pT3, pN1c, pMX, see comment. Ancillary studies: Per the [place] gastrointestinal working group guidelines, tumor will be submitted for both mismatch repair protein expression by immunohistochemistry and microsatellite instability testing by PCR. The results will be reported in an add on. Comment: Although tumor is not directly involving the serosa (visceral peritoneum), tumor is less than 1 mm from the serosal surface and is associated with fibroinflammatory inflammation involving the serosa. Some studies consider this finding to represent serosal involvement.  Tumor MSI: STABLE   Ovary, left and cystic mass 11/01/2014 - MIXED ENDOMETRIOID AND CLEAR CELL CARCINOMA, FIGO GRADE III/III, OVARIAN PRIMARY. - FOCAL FALLOPIAN TUBE CARCINOMA IN SITU. - SEE ONCOLOGY TABLE BELOW.  Specimen(s): Left ovary and left fallopian tube. Procedure: (including lymph node sampling) Salpingo-oophorectomy. Primary tumor site (including laterality): Left ovary. Ovarian surface involvement: Present. Ovarian capsule intact without fragmentation: Disrupted. Maximum tumor size (cm): At least 3.7 cm. Histologic type: Mixed endometrioid and clear cell adenocarcinoma. Grade: III Peritoneal implants: (specify invasive or non-invasive): Not identified. Pelvic extension (list additional structures on separate lines and if  involved): Not identified. Lymph nodes: number examined 0 ; number positive N/A TNM code: pT1c, pNX FIGO Stage (based on pathologic findings, needs clinical correlation): IC, at least. Comments: The carcinoma present in the current specimen is morphologically dissimilar from the tumor in the patient's previous colectomy, [phone]. The tumor cells in the current case are positive for cytokeratin 7 and estrogen receptor. There is faint staining for p53. Cytokeratin 20 and CDX2 are negative. Overall, the features are consistent with primary  ovarian adenocarcinoma with mixed endometrioid and clear cell features. There is a small focus of carcinoma in situ present in fallopian tube epithelium in random tissue. Dr Claudette Laws has reviewed the case and concurs with the interpretation. The case was discussed with Dr Hulen Skains on 11/06/2014. (  RADIOGRAPHIC STUDIES: I have personally reviewed the radiological images as listed and agreed with the findings in the report.  Ct chest, Abdomen Pelvis W Contrast 09/13/2015  IMPRESSION: 1. No evidence of metastatic disease in the chest, abdomen or pelvis. 2. Stable postsurgical changes status post right hemicolectomy and TAH-BSO, with no evidence of local tumor recurrence. 3. Patent femoral-femoral arterial bypass graft.   ASSESSMENT & PLAN:  55 year old female with past medical history of arthritis, hysterectomy, history of abdominal bleeding status post embolization in 2005, who presented with abdominal pain nausea vomiting and weight loss. CT of abdomen showed a 3.8cm cecal mass with associated ileocecal intussusception, and a 9.1cm solid and cystic mass in the left pelvis,which was found to be a ovarian cancer. She is status post right hemicolectomy and terminal ileoectomy on 09/18/2014, and left Salpingo-oophorectomy on 11/01/14.   1. Right colon cancer, PT3pN1M0, stage III, MSI-stable  -I reviewed her CT scan and the surgical pathology findings  with her in details. Based on her surgical pathology, she has stage III disease.  -Her tumor microsatellite stability test showed stable, she unlikely has Lynch syndrome. -Althrough I recommended adjuvant chemotherapy, which is standard care for stage III colon cancer, her chemotherapy was postponed due to her multiple surgery, significance of pain after surgeries, and compliance issue (multiple no show), she was 5 months out of initial colon surgery when she returned for follow up after surgery, and the benefit of adjuvant chemotherapy was minimal at that point. So she did not receive adjuvant chemotherapy -we discussed survelliance plan, for a total of 5 years. Given the high risk of cancer recurrence, I plan to see her every 3-4 months for the first 2 years, with a repeated CT scan every 3-6 months. - I reviewed her repeated CT scan from October 2016, which showed no evidence of recurrence -she is clinically doing very well, physical exam was unremarkable, no clinical concern for recurrence. -We'll obtain a surveillance CT scan in the next 2-3 weeks.  2. Ovary cancer, left, pT1 cNX M0, at least stage I, mixed endometrioid and clear cell histology, grade 3, ER positive. - She underwent right right oophorectomy and  Pelvic lymph nodes biopsy for staging, which wall over negative. -She is likely need adjuvant chemotherapy, the standard therapy is carboplatin and paclitaxel for overan cancer. She is close to 3 months out of her initial ovary surgery, and she developed left lower extremity neuropathy after her recent surgery, not a great candidate for chemotherapy. I discussed with Dr. Denman George about her adjuvant adjuvant chemotherapy. Dr. Denman George feels her colon cancer is at much high risk for recurrence than ovarian cancer, and she is also quite far our from her initial ovaran cancer surgery, the benefit of chemotherapy for ovarian cancer is very limited. Due to her limited PS and pain issue, she is also not a  good candidate for chemo.  -We'll follow up CA125,  Which has been normal.  3. BRCA1 mutation carrier -I previously discussed her recent genetic testing results which revealed positive BRCA1 mutation. -I previously discussed extensively about the risk of breast cancer (57-65% by age of 17), and I recommend prophylactic bilateral mastectomy, but she declined. She agrees to have annual  screening mammogram -She is due for screening mammogram next month, I'll set up for her.  4. abdominal and left lower extremity neuropathy   -Likely related to her peripheral vascular disease and a neuropathy of the surgery. -Much improved lately, she is on Lyrica, but does not feel it helps   4. PVD -she willl follow up with Dr. Eden Lathe   5. Coping and social support, history of depression and PTSD -She has had some difficulty coping with her synchronized colon and ovarian cancer. She lives alone, does not want her children to be involved much in her cancer care.  -her mood has much improved over time, she is coping much better lately.  Plan -RTC in 4 month for follow up with lab.  -Surveillance CT chest, abdomen and pelvis in the next 2-3 weeks. I'll ask patient to call me after the scan and we'll review his results over the phone if no cancer recurrence.   All questions were answered. The patient knows to call the clinic with any problems, questions or concerns. I spent 20 minutes counseling the patient face to face. The total time spent in the appointment was 30 minutes and more than 50% was on counseling.     Truitt Merle, MD 07/10/2016

## 2016-07-11 LAB — CA 125: CANCER ANTIGEN (CA) 125: 6 U/mL (ref 0.0–38.1)

## 2016-07-11 LAB — CANCER ANTIGEN 125 (PARALLEL TESTING): CA 125: 7 U/mL (ref <35)

## 2016-07-14 ENCOUNTER — Encounter: Payer: Self-pay | Admitting: Hematology

## 2016-08-06 ENCOUNTER — Ambulatory Visit (HOSPITAL_COMMUNITY)
Admission: RE | Admit: 2016-08-06 | Discharge: 2016-08-06 | Disposition: A | Discharge status: Home / Self Care | Payer: Medicare Other | Source: Ambulatory Visit | Attending: Hematology | Admitting: Hematology

## 2016-08-06 ENCOUNTER — Encounter (HOSPITAL_COMMUNITY): Payer: Self-pay

## 2016-08-06 DIAGNOSIS — R918 Other nonspecific abnormal finding of lung field: Secondary | ICD-10-CM | POA: Insufficient documentation

## 2016-08-06 DIAGNOSIS — M47816 Spondylosis without myelopathy or radiculopathy, lumbar region: Secondary | ICD-10-CM | POA: Diagnosis not present

## 2016-08-06 DIAGNOSIS — M5136 Other intervertebral disc degeneration, lumbar region: Secondary | ICD-10-CM | POA: Insufficient documentation

## 2016-08-06 DIAGNOSIS — I745 Embolism and thrombosis of iliac artery: Secondary | ICD-10-CM | POA: Diagnosis not present

## 2016-08-06 DIAGNOSIS — C182 Malignant neoplasm of ascending colon: Secondary | ICD-10-CM | POA: Diagnosis not present

## 2016-08-06 DIAGNOSIS — M87852 Other osteonecrosis, left femur: Secondary | ICD-10-CM | POA: Diagnosis not present

## 2016-08-06 DIAGNOSIS — C189 Malignant neoplasm of colon, unspecified: Secondary | ICD-10-CM | POA: Diagnosis not present

## 2016-08-06 MED ORDER — IOPAMIDOL (ISOVUE-300) INJECTION 61%
100.0000 mL | Freq: Once | INTRAVENOUS | Status: AC | PRN
  Administered 2016-08-06: 100 mL via INTRAVENOUS

## 2016-08-11 ENCOUNTER — Telehealth: Payer: Self-pay | Admitting: *Deleted

## 2016-08-11 NOTE — Telephone Encounter (Signed)
Patient called after missed call.  Informed her of scan results.  No further questions.

## 2016-08-11 NOTE — Telephone Encounter (Signed)
TC to patient this am to share results of her recent CT Scan (08/06/16). Scans showed no recurrence of cancer.  No answer but was able to leave message forpt to call back.

## 2016-08-13 ENCOUNTER — Telehealth: Payer: Self-pay | Admitting: Physical Medicine & Rehabilitation

## 2016-08-13 NOTE — Telephone Encounter (Signed)
Patient is requesting a refill on Lyrica.  Please call patient at [phone].

## 2016-08-13 NOTE — Telephone Encounter (Signed)
Informed patient that she needs to call and make an appointment to see Dr. Letta Pate. She had cancelled her appointment in July and had not rescheduled

## 2016-08-18 ENCOUNTER — Emergency Department (HOSPITAL_COMMUNITY): Payer: Medicare Other

## 2016-08-18 ENCOUNTER — Ambulatory Visit (INDEPENDENT_AMBULATORY_CARE_PROVIDER_SITE_OTHER): Payer: Medicare Other | Admitting: Family

## 2016-08-18 ENCOUNTER — Emergency Department (HOSPITAL_COMMUNITY)
Admission: EM | Admit: 2016-08-18 | Discharge: 2016-08-18 | Disposition: A | Discharge status: Home / Self Care | Payer: Medicare Other | Attending: Emergency Medicine | Admitting: Emergency Medicine

## 2016-08-18 ENCOUNTER — Encounter: Payer: Self-pay | Admitting: Family

## 2016-08-18 ENCOUNTER — Encounter (HOSPITAL_COMMUNITY): Payer: Self-pay | Admitting: Emergency Medicine

## 2016-08-18 DIAGNOSIS — R404 Transient alteration of awareness: Secondary | ICD-10-CM | POA: Diagnosis not present

## 2016-08-18 DIAGNOSIS — Z79899 Other long term (current) drug therapy: Secondary | ICD-10-CM | POA: Insufficient documentation

## 2016-08-18 DIAGNOSIS — Z8543 Personal history of malignant neoplasm of ovary: Secondary | ICD-10-CM | POA: Diagnosis not present

## 2016-08-18 DIAGNOSIS — F1290 Cannabis use, unspecified, uncomplicated: Secondary | ICD-10-CM | POA: Diagnosis not present

## 2016-08-18 DIAGNOSIS — I10 Essential (primary) hypertension: Secondary | ICD-10-CM | POA: Diagnosis not present

## 2016-08-18 DIAGNOSIS — R112 Nausea with vomiting, unspecified: Secondary | ICD-10-CM | POA: Diagnosis not present

## 2016-08-18 DIAGNOSIS — R0602 Shortness of breath: Secondary | ICD-10-CM | POA: Insufficient documentation

## 2016-08-18 DIAGNOSIS — A084 Viral intestinal infection, unspecified: Secondary | ICD-10-CM | POA: Insufficient documentation

## 2016-08-18 DIAGNOSIS — R531 Weakness: Secondary | ICD-10-CM | POA: Diagnosis not present

## 2016-08-18 DIAGNOSIS — R197 Diarrhea, unspecified: Secondary | ICD-10-CM | POA: Diagnosis not present

## 2016-08-18 DIAGNOSIS — Z85038 Personal history of other malignant neoplasm of large intestine: Secondary | ICD-10-CM | POA: Insufficient documentation

## 2016-08-18 DIAGNOSIS — R1084 Generalized abdominal pain: Secondary | ICD-10-CM | POA: Insufficient documentation

## 2016-08-18 DIAGNOSIS — J45909 Unspecified asthma, uncomplicated: Secondary | ICD-10-CM | POA: Diagnosis not present

## 2016-08-18 LAB — COMPREHENSIVE METABOLIC PANEL
ALK PHOS: 83 U/L (ref 38–126)
ALT: 14 U/L (ref 14–54)
AST: 22 U/L (ref 15–41)
Albumin: 4.5 g/dL (ref 3.5–5.0)
Anion gap: 11 (ref 5–15)
BUN: 8 mg/dL (ref 6–20)
CALCIUM: 9.5 mg/dL (ref 8.9–10.3)
CHLORIDE: 107 mmol/L (ref 101–111)
CO2: 22 mmol/L (ref 22–32)
CREATININE: 0.75 mg/dL (ref 0.44–1.00)
GFR calc Af Amer: >60 mL/min (ref >60)
Glucose, Bld: 109 mg/dL — ABNORMAL HIGH (ref 65–99)
Potassium: 3.7 mmol/L (ref 3.5–5.1)
Sodium: 140 mmol/L (ref 135–145)
Total Bilirubin: 0.8 mg/dL (ref 0.3–1.2)
Total Protein: 8.3 g/dL — ABNORMAL HIGH (ref 6.5–8.1)

## 2016-08-18 LAB — CBC
HCT: 40.9 % (ref 36.0–46.0)
Hemoglobin: 14.9 g/dL (ref 12.0–15.0)
MCH: 35.3 pg — AB (ref 26.0–34.0)
MCHC: 36.4 g/dL — ABNORMAL HIGH (ref 30.0–36.0)
MCV: 96.9 fL (ref 78.0–100.0)
PLATELETS: 171 10*3/uL (ref 150–400)
RBC: 4.22 MIL/uL (ref 3.87–5.11)
RDW: 12.4 % (ref 11.5–15.5)
WBC: 6.9 10*3/uL (ref 4.0–10.5)

## 2016-08-18 LAB — URINALYSIS, ROUTINE W REFLEX MICROSCOPIC
GLUCOSE, UA: NEGATIVE mg/dL
KETONES UR: 15 mg/dL — AB
LEUKOCYTES UA: NEGATIVE
Nitrite: NEGATIVE
PROTEIN: 30 mg/dL — AB
Specific Gravity, Urine: 1.02 (ref 1.005–1.030)
pH: 6 (ref 5.0–8.0)

## 2016-08-18 LAB — LIPASE, BLOOD: LIPASE: 24 U/L (ref 11–51)

## 2016-08-18 LAB — I-STAT TROPONIN, ED: Troponin i, poc: 0.01 ng/mL (ref 0.00–0.08)

## 2016-08-18 LAB — URINE MICROSCOPIC-ADD ON

## 2016-08-18 MED ORDER — SODIUM CHLORIDE 0.9 % IV BOLUS (SEPSIS)
1000.0000 mL | Freq: Once | INTRAVENOUS | Status: AC
  Administered 2016-08-18: 1000 mL via INTRAVENOUS

## 2016-08-18 MED ORDER — ONDANSETRON 4 MG PO TBDP: 4.0000 mg | ORAL_TABLET | Freq: Three times a day (TID) | ORAL | 0 refills | Status: DC | PRN

## 2016-08-18 MED ORDER — ONDANSETRON 4 MG PO TBDP
4.0000 mg | ORAL_TABLET | Freq: Once | ORAL | Status: DC | PRN
  Filled 2016-08-18: qty 1

## 2016-08-18 NOTE — Discharge Instructions (Signed)
Take Zofran as needed for nausea. Be sure to drink plenty of fluids. Follow-up with your primary care provider in 2 days if your symptoms are not improving. Return immediately to the emergency department if your symptoms worsen, you're unable to eat or drink anything, fevers, you have blood in your vomit or blood in your stool, abdominal pain or any other concerning symptoms.

## 2016-08-18 NOTE — ED Notes (Signed)
Pt ambulatory to the restroom.  

## 2016-08-18 NOTE — ED Provider Notes (Signed)
[facility] DEPT Provider Note   CSN: [id number] Arrival date & time: 08/18/16  1232     History   Chief Complaint Chief Complaint  Patient presents with  . Nausea  . Emesis  . Diarrhea    HPI Catherine Rogers is a 55 y.o. female.  HPI   Patient is a 55 year old female with a history of ovarian and colon cancer, GERD, anxiety, asthma who presents to the emergency department with progressively worsening nausea vomiting and diarrhea for 4 days. Patient states she went to a football party on Thursday and woke Friday not feeling right. Patient states she is unable to keep any food or liquids down since Saturday. Associated abdominal cramping the left side only with coughing. Associated intermittent shortness of breath, chills, sweats, subjective fevers, sweating, loss of appetite. Patient denies hematuria, dysuria, hematochezia, hematemesis, headaches, chest pain.  Past Medical History:  Diagnosis Date  . Anemia   . Anxiety   . Arthritis   . Asthma   . Cancer ([place])    colon and ovarian  . Complication of anesthesia    woke and saw long tube coming out of mouth . sat up then went back to sleep  . Depression   . Ectopic pregnancy   . Family history of adverse reaction to anesthesia    sister on dialysis- hypotension   . Family history of ovarian cancer   . GERD (gastroesophageal reflux disease)    occasional  . Head injury, closed, with brief LOC ([place])    had a cut- as a child  . Headache   . History of blood transfusion    2005 approximately   . Hypertension   . PAD (peripheral artery disease) ([place]) 04/18/2015  . Pelvic cyst   . PONV (postoperative nausea and vomiting)   . PTSD (post-traumatic stress disorder)     Patient Active Problem List   Diagnosis Date Noted  . Viral gastroenteritis 08/18/2016  . Type II CRPS (complex regional pain syndrome) 10/22/2015  . Numbness of left foot 07/05/2015  . Pain, joint, foot 07/05/2015  . Swelling of foot joint 07/05/2015    . Left foot pain 05/31/2015  . BRCA1 genetic carrier 05/12/2015  . PAD (peripheral artery disease) ([place]) 04/18/2015  . Genetic testing 04/06/2015  . Family history of ovarian cancer   . Lumbosacral plexopathy 01/29/2015  . Leg pain, left 01/29/2015  . Paresthesia 01/29/2015  . Left leg numbness 01/05/2015  . Postoperative anemia due to acute blood loss 12/28/2014  . Ovarian cancer ([place]) 12/26/2014  . Hypertension 12/22/2014  . Anxiety 12/22/2014  . Cancer associated pain 12/22/2014  . 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 ([place]) 11/28/2014  . Cancer of right colon ([place]) 10/11/2014  . Intussusception, ileocecal ([place]) 09/18/2014  . Mass of pelvis 09/18/2014  . GI bleed 09/16/2014  . Alcohol use ([place]) 09/16/2014    Past Surgical History:  Procedure Laterality Date  . ABDOMINAL HYSTERECTOMY    . COLONOSCOPY    . ESOPHAGOGASTRODUODENOSCOPY N/A 09/17/2014   Procedure: ESOPHAGOGASTRODUODENOSCOPY (EGD);  Surgeon: Jeryl Columbia, MD;  Location[facility] ENDOSCOPY;  Service: Endoscopy;  Laterality: N/A;  . FEMORAL-FEMORAL BYPASS GRAFT Bilateral 04/18/2015   Procedure:  RIGHT COMMON FEMORAL TO LEFT COMMON  FEMORAL  ARTERY BYPASS WITH  8 MM HEMASHIELD GRAFT,RIGHT COMMON SUPERFICIAL FEMORAL ARTERY ENDARTERECTOMY;  Surgeon: Elam Dutch, MD;  Location: [facility];  Service: Vascular;  Laterality: Bilateral;  . ILEOSTOMY Right 09/18/2014   Procedure: ILEOCOLOSTOMY;  Surgeon: Doreen Salvage, MD;  Location[facility]  OR;  Service: General;  Laterality: Right;  . LAPAROTOMY N/A 11/01/2014   Procedure: EXPLORATORY LAPAROTOMY;  Surgeon: Doreen Salvage, MD;  Location: [facility];  Service: General;  Laterality: N/A;  . LAPAROTOMY N/A 12/26/2014   Procedure:  LAPAROTOMY ;  Surgeon: Everitt Amber, MD;  Location: [facility];  Service: Gynecology;  Laterality: N/A;  . MASS EXCISION  11/01/2014   Procedure: EXCISION PELVIC MASS;  Surgeon: Doreen Salvage, MD;  Location: [facility];  Service: General;;  . OOPHORECTOMY  2016  . PARTIAL COLECTOMY Right 09/18/2014    Procedure: RIGHT HEMI COLECTOMY;  Surgeon: Doreen Salvage, MD;  Location: [place];  Service: General;  Laterality: Right;  . ROBOTIC ASSISTED TOTAL HYSTERECTOMY WITH BILATERAL SALPINGO OOPHERECTOMY Right 12/26/2014   Procedure: EXPLORATORY LAPAROSCOPY  RIGHT SALPINGO OOPHORECTOMY OMENTECTOMY LYMPHADECTOMY ;  Surgeon: Everitt Amber, MD;  Location: [facility];  Service: Gynecology;  Laterality: Right;  . TUBAL LIGATION      OB History    No data available       Home Medications    Prior to Admission medications   Medication Sig Start Date End Date Taking? Authorizing Provider  acetaminophen (TYLENOL) 500 MG tablet Take 500 mg by mouth every 6 (six) hours as needed for moderate pain.   Yes Historical Provider, MD  LYRICA 200 MG capsule take 1 capsule by mouth every 8 hours as directed by prescriber 02/04/16  Yes Charlett Blake, MD  docusate sodium 100 MG CAPS Take 100 mg by mouth 2 (two) times daily as needed for mild constipation. 09/22/14   Nat Christen, PA-C  ondansetron (ZOFRAN ODT) 4 MG disintegrating tablet Take 1 tablet (4 mg total) by mouth every 8 (eight) hours as needed for nausea or vomiting. 08/18/16   Kalman Drape, PA    Family History Family History  Problem Relation Age of Onset  . Brain cancer Sister 42  . Lung cancer Brother 36  . Heart attack Mother   . Heart attack Father     5s  . Heart attack Maternal Uncle   . Diabetes Maternal Uncle   . Ovarian cancer Paternal Aunt     3 paternal aunts with ovarian cancer  . Heart attack Brother   . Diabetes Paternal Uncle     Social History Social History  Substance Use Topics  . Smoking status: Never Smoker  . Smokeless tobacco: Never Used     Comment: pack per month of cigars = 5  . Alcohol use No     Comment: socially      Allergies   Other; Demerol [meperidine]; Morphine and related; Oxycodone; Penicillins; Tylenol [acetaminophen]; and Ibuprofen   Review of Systems Review of Systems  Constitutional: Positive for  appetite change, chills, diaphoresis and fever (subjective).  HENT: Negative for sore throat and trouble swallowing.   Respiratory: Positive for shortness of breath.   Cardiovascular: Negative for chest pain.  Gastrointestinal: Positive for abdominal pain, diarrhea, nausea and vomiting. Negative for abdominal distention and blood in stool.  Genitourinary: Negative for dysuria, flank pain and hematuria.  Skin: Negative for rash.     Physical Exam Updated Vital Signs BP 183/97   Pulse 85   Temp 98.8 F (37.1 C) (Oral)   Resp 13   Ht 5' 7"  (1.702 m)   Wt 86.2 kg   SpO2 99%   BMI 29.76 kg/m   Physical Exam  Constitutional: She appears well-developed and well-nourished. No distress.  HENT:  Head: Normocephalic and atraumatic.  Eyes: Conjunctivae are normal.  Cardiovascular: Normal rate, regular rhythm and normal heart sounds.  Exam reveals no gallop and no friction rub.   No murmur heard. Pulses:      Radial pulses are 2+ on the right side, and 2+ on the left side.  Pulmonary/Chest: Effort normal and breath sounds normal. No respiratory distress. She has no wheezes. She has no rales.  Abdominal: Soft. She exhibits no distension. Bowel sounds are increased. There is generalized tenderness. There is no rigidity, no guarding, no CVA tenderness and negative Murphy's sign.  Musculoskeletal: Normal range of motion.  Neurological: She is alert. Coordination normal.  Skin: Skin is warm and dry. She is not diaphoretic.  Psychiatric: She has a normal mood and affect. Her behavior is normal.  Nursing note and vitals reviewed.    ED Treatments / Results  Labs (all labs ordered are listed, but only abnormal results are displayed) Labs Reviewed  COMPREHENSIVE METABOLIC PANEL - Abnormal; Notable for the following:       Result Value   Glucose, Bld 109 (*)    Total Protein 8.3 (*)    All other components within normal limits  CBC - Abnormal; Notable for the following:    MCH 35.3 (*)      MCHC 36.4 (*)    All other components within normal limits  URINALYSIS, ROUTINE W REFLEX MICROSCOPIC (NOT AT [facility]) - Abnormal; Notable for the following:    Color, Urine AMBER (*)    Hgb urine dipstick TRACE (*)    Bilirubin Urine SMALL (*)    Ketones, ur 15 (*)    Protein, ur 30 (*)    All other components within normal limits  URINE MICROSCOPIC-ADD ON - Abnormal; Notable for the following:    Squamous Epithelial / LPF 0-5 (*)    Bacteria, UA RARE (*)    All other components within normal limits  LIPASE, BLOOD  I-STAT TROPOININ, ED    EKG  EKG Interpretation None       Radiology Dg Chest 2 View  Result Date: 08/18/2016 CLINICAL DATA:  Nausea and vomiting for 4 days. EXAM: CHEST  2 VIEW COMPARISON:  November 01, 2014 FINDINGS: The heart size and mediastinal contours are within normal limits. There is no focal infiltrate, pulmonary edema, or pleural effusion. The visualized skeletal structures are stable. IMPRESSION: No active cardiopulmonary disease. Electronically Signed   By: Abelardo Diesel M.D.   On: 08/18/2016 20:15    Procedures Procedures (including critical care time)  Medications Ordered in ED Medications  sodium chloride 0.9 % bolus 1,000 mL (0 mLs Intravenous Stopped 08/18/16 2141)     Initial Impression / Assessment and Plan / ED Course  I have reviewed the triage vital signs and the nursing notes.  Pertinent labs & imaging results that were available during my care of the patient were reviewed by me and considered in my medical decision making (see chart for details).  Clinical Course   Patient is nontoxic, nonseptic appearing, in no apparent distress.  Patient was sent here by her primary care provider for fluids for treatment for viral gastroenteritis. Patient's pain and other symptoms adequately managed in emergency department.  Fluid bolus given.  Labs and vitals reviewed. Patient was able to tolerate fluids while in the ED. Patient does not meet the SIRS  or Sepsis criteria.  On repeat exam patient does not have a surgical abdomin and there are no peritoneal signs.  No indication of appendicitis, bowel obstruction, bowel  perforation, cholecystitis, diverticulitis.  Patient discharged home with symptomatic treatment and given strict instructions for follow-up with their primary care physician.  I have also discussed reasons to return immediately to the ER.  Patient expresses understanding and agrees with plan.  Pt case discussed and pt seen by Dr. Zenia Resides who agrees with the above plan.  Final Clinical Impressions(s) / ED Diagnoses   Final diagnoses:  Nausea vomiting and diarrhea    New Prescriptions Discharge Medication List as of 08/18/2016  9:39 PM    START taking these medications   Details  ondansetron (ZOFRAN ODT) 4 MG disintegrating tablet Take 1 tablet (4 mg total) by mouth every 8 (eight) hours as needed for nausea or vomiting., Starting Mon 08/18/2016, [place], [place] 08/19/16 0121    Lacretia Leigh, MD 08/25/16 1919

## 2016-08-18 NOTE — Patient Instructions (Signed)
Thank you for choosing [facility].  SUMMARY AND INSTRUCTIONS:  Recommend follow up in the ED.   Follow up:  If your symptoms worsen or fail to improve, please contact our office for further instruction, or in case of emergency go directly to the emergency room at the closest medical facility.   Viral Gastroenteritis Viral gastroenteritis is also known as stomach flu. This condition affects the stomach and intestinal tract. It can cause sudden diarrhea and vomiting. The illness typically lasts 3 to 8 days. Most people develop an immune response that eventually gets rid of the virus. While this natural response develops, the virus can make you quite ill. CAUSES  Many different viruses can cause gastroenteritis, such as rotavirus or noroviruses. You can catch one of these viruses by consuming contaminated food or water. You may also catch a virus by sharing utensils or other personal items with an infected person or by touching a contaminated surface. SYMPTOMS  The most common symptoms are diarrhea and vomiting. These problems can cause a severe loss of body fluids (dehydration) and a body salt (electrolyte) imbalance. Other symptoms may include:  Fever.  Headache.  Fatigue.  Abdominal pain. DIAGNOSIS  Your caregiver can usually diagnose viral gastroenteritis based on your symptoms and a physical exam. A stool sample may also be taken to test for the presence of viruses or other infections. TREATMENT  This illness typically goes away on its own. Treatments are aimed at rehydration. The most serious cases of viral gastroenteritis involve vomiting so severely that you are not able to keep fluids down. In these cases, fluids must be given through an intravenous line (IV). HOME CARE INSTRUCTIONS   Drink enough fluids to keep your urine clear or pale yellow. Drink small amounts of fluids frequently and increase the amounts as tolerated.  Ask your caregiver for specific rehydration  instructions.  Avoid:  Foods high in sugar.  Alcohol.  Carbonated drinks.  Tobacco.  Juice.  Caffeine drinks.  Extremely hot or cold fluids.  Fatty, greasy foods.  Too much intake of anything at one time.  Dairy products until 24 to 48 hours after diarrhea stops.  You may consume probiotics. Probiotics are active cultures of beneficial bacteria. They may lessen the amount and number of diarrheal stools in adults. Probiotics can be found in yogurt with active cultures and in supplements.  Wash your hands well to avoid spreading the virus.  Only take over-the-counter or prescription medicines for pain, discomfort, or fever as directed by your caregiver. Do not give aspirin to children. Antidiarrheal medicines are not recommended.  Ask your caregiver if you should continue to take your regular prescribed and over-the-counter medicines.  Keep all follow-up appointments as directed by your caregiver. SEEK IMMEDIATE MEDICAL CARE IF:   You are unable to keep fluids down.  You do not urinate at least once every 6 to 8 hours.  You develop shortness of breath.  You notice blood in your stool or vomit. This may look like coffee grounds.  You have abdominal pain that increases or is concentrated in one small area (localized).  You have persistent vomiting or diarrhea.  You have a fever.  The patient is a child younger than 3 months, and he or she has a fever.  The patient is a child older than 3 months, and he or she has a fever and persistent symptoms.  The patient is a child older than 3 months, and he or she has a fever and symptoms  suddenly get worse.  The patient is a baby, and he or she has no tears when crying. MAKE SURE YOU:   Understand these instructions.  Will watch your condition.  Will get help right away if you are not doing well or get worse.   This information is not intended to replace advice given to you by your health care provider. Make sure you  discuss any questions you have with your health care provider.   Document Released: 11/10/2005 Document Revised: 02/02/2012 Document Reviewed: 08/27/2011 Elsevier Interactive Patient Education [employer].

## 2016-08-18 NOTE — ED Notes (Signed)
Pt stated she wanted to leave now.  Papers were quickly printed.  Pt verbalized understanding of discharge instructions but refused the need to sign out.

## 2016-08-18 NOTE — ED Notes (Signed)
PT have been made aware of urine sample 

## 2016-08-18 NOTE — ED Notes (Signed)
Pt had a cup of soda and did well

## 2016-08-18 NOTE — ED Triage Notes (Signed)
Per EMS patient comes from [facility] on [address] for n/v/d that started on Friday.

## 2016-08-18 NOTE — ED Provider Notes (Signed)
Medical screening examination/treatment/procedure(s) were conducted as a shared visit with non-physician practitioner(s) and myself.  I personally evaluated the patient during the encounter.   EKG Interpretation None     Patient here 2 days of nausea vomiting diarrhea. Likely gastroenteritis. Abdominal exam is benign. Has hasn't cough and congestion without fever. Will IV hydrate here and discharged home   Lacretia Leigh, MD 08/18/16 [phone]

## 2016-08-18 NOTE — Assessment & Plan Note (Signed)
Symptoms and exam are concerning for viral gastroenteritis and dehydration secondary to decreased intake and diarrhea. She is also very anxious. Recommend referral to the ED for fluids and possible monitoring if needed. Vital signs appears stable with no fever. EMS was called and will be transporting patient to the hospital. Patient in agreement with the plan.

## 2016-08-18 NOTE — ED Notes (Signed)
Patient transported to X-ray 

## 2016-08-18 NOTE — Progress Notes (Signed)
Subjective:    Patient ID: Catherine Rogers, female    DOB: Dec 09, 1960, 55 y.o.   MRN: [id number]  Chief Complaint  Patient presents with  . Nausea    starting friday, nausea, vomitting, diarrhea, feels lethargic    HPI:  Catherine Rogers is a 55 y.o. female who  has a past medical history of Anemia; Anxiety; Arthritis; Asthma; Cancer ([place]); Complication of anesthesia; Depression; Ectopic pregnancy; Family history of adverse reaction to anesthesia; Family history of ovarian cancer; GERD (gastroesophageal reflux disease); Head injury, closed, with brief LOC ([place]); Headache; History of blood transfusion; Hypertension; PAD (peripheral artery disease) ([place]) (04/18/2015); Pelvic cyst; PONV (postoperative nausea and vomiting); and PTSD (post-traumatic stress disorder). and presents today for an office visit.   This is a new problem. Associated symptoms of nausea, vomiting, diarrhea and feeling lethargic have been going on for about 4 days. Describes she has had abdominal cramping at times. Multiple episodes of diarrhea and vomiting with the most recent being this morning. There are no modifying factors that make it better or worse.   Allergies  Allergen Reactions  . Other Hives and Itching    Highly acidic foods "make me itch and break out" oranges, tomatoes  . Demerol [Meperidine] Nausea And Vomiting  . Morphine And Related Nausea And Vomiting  . Oxycodone Itching  . Penicillins Other (See Comments)    Childhood allergy  . Tylenol [Acetaminophen] Nausea And Vomiting  . Ibuprofen Nausea And Vomiting      Outpatient Medications Prior to Visit  Medication Sig Dispense Refill  . docusate sodium 100 MG CAPS Take 100 mg by mouth 2 (two) times daily as needed for mild constipation. 10 capsule 0  . LYRICA 200 MG capsule take 1 capsule by mouth every 8 hours as directed by prescriber 90 capsule 5   No facility-administered medications prior to visit.       Past Surgical History:    Procedure Laterality Date  . ABDOMINAL HYSTERECTOMY    . COLONOSCOPY    . ESOPHAGOGASTRODUODENOSCOPY N/A 09/17/2014   Procedure: ESOPHAGOGASTRODUODENOSCOPY (EGD);  Surgeon: Jeryl Columbia, MD;  [facility] END[facility]COPY;  Service: Endoscopy;  Laterality: N/A;  . FEMORAL-FEMORAL BYPASS GRAFT Bilateral 04/18/2015   Procedure:  RIGHT COMMON FEMORAL TO LEFT COMMON  FEMORAL  ARTERY BYPASS WITH  8 MM HEMASHIELD GRAFT,RIGHT COMMON SUPERFICIAL FEMORAL ARTERY ENDARTERECTOMY;  Surgeon: Elam Dutch, MD;  Location: [facility];  Service: Vascular;  Laterality: Bilateral;  . ILEOSTOMY Right 09/18/2014   Procedure: ILEOCOLOSTOMY;  Surgeon: Doreen Salvage, MD;  Location: [facility];  Service: General;  Laterality: Right;  . LAPAROTOMY N/A 11/01/2014   Procedure: EXPLORATORY LAPAROTOMY;  Surgeon: Doreen Salvage, MD;  Location: [facility];  Service: General;  Laterality: N/A;  . LAPAROTOMY N/A 12/26/2014   Procedure:  LAPAROTOMY ;  Surgeon: Everitt Amber, MD;  [facility];  Service: Gynecology;  Laterality: N/A;  . MASS EXCISION  11/01/2014   Procedure: EXCISION PELVIC MASS;  Surgeon: Doreen Salvage, MD;  Location: [place];  Service: General;;  . OOPHORECTOMY  2016  . PARTIAL COLECTOMY Right 09/18/2014   Procedure: RIGHT HEMI COLECTOMY;  Surgeon: Doreen Salvage, MD;  Location: [facility];  Service: General;  Laterality: Right;  . ROBOTIC ASSISTED TOTAL HYSTERECTOMY WITH BILATERAL SALPINGO OOPHERECTOMY Right 12/26/2014   Procedure: EXPLORATORY LAPAROSCOPY  RIGHT SALPINGO OOPHORECTOMY OMENTECTOMY LYMPHADECTOMY ;  Surgeon: Everitt Amber, MD;  [facility];  Service: Gynecology;  Laterality: Right;  . TUBAL LIGATION  Past Medical History:  Diagnosis Date  . Anemia   . Anxiety   . Arthritis   . Asthma   . Cancer ([place])    colon and ovarian  . Complication of anesthesia    woke and saw long tube coming out of mouth . sat up then went back to sleep  . Depression   . Ectopic pregnancy   . Family history of adverse reaction to anesthesia     sister on dialysis- hypotension   . Family history of ovarian cancer   . GERD (gastroesophageal reflux disease)    occasional  . Head injury, closed, with brief LOC ([place])    had a cut- as a child  . Headache   . History of blood transfusion    2005 approximately   . Hypertension   . PAD (peripheral artery disease) ([place]) 04/18/2015  . Pelvic cyst   . PONV (postoperative nausea and vomiting)   . PTSD (post-traumatic stress disorder)      Review of Systems  Constitutional: Positive for chills and fever.  Cardiovascular: Negative for chest pain, palpitations and leg swelling.  Gastrointestinal: Positive for abdominal pain, diarrhea, nausea and vomiting.      Objective:    BP (!) 184/110 (BP Location: Left Arm, Patient Position: Sitting, Cuff Size: Normal)   Pulse 95   Temp 98.2 F (36.8 C) (Oral)   Resp (!) 22   Ht '5\' 7"'$  (1.702 m)   Wt 190 lb (86.2 kg)   SpO2 99%   BMI 29.76 kg/m  Nursing note and vital signs reviewed.  Physical Exam  Constitutional: She is oriented to person, place, and time. She appears well-developed and well-nourished. No distress.  Cardiovascular: Normal rate, regular rhythm, normal heart sounds and intact distal pulses.   Pulmonary/Chest: Effort normal and breath sounds normal.  Abdominal: Normal appearance and bowel sounds are normal. There is generalized tenderness. There is no rigidity, no rebound, no guarding, no tenderness at McBurney's point and negative Murphy's sign.  Neurological: She is alert and oriented to person, place, and time.  Skin: Skin is warm and dry.  Psychiatric: She has a normal mood and affect. Her behavior is normal. Judgment and thought content normal.       Assessment & Plan:   Problem List Items Addressed This Visit      Digestive   Viral gastroenteritis    Symptoms and exam are concerning for viral gastroenteritis and dehydration secondary to decreased intake and diarrhea. She is also very anxious. Recommend referral  to the ED for fluids and possible monitoring if needed. Vital signs appears stable with no fever. EMS was called and will be transporting patient to the hospital. Patient in agreement with the plan.        Other Visit Diagnoses   None.      I am having Ms. Bodi maintain her DSS and LYRICA.   Follow-up: Return if symptoms worsen or fail to improve.  Mauricio Po, FNP

## 2016-08-19 ENCOUNTER — Ambulatory Visit: Payer: Medicare Other | Admitting: Physical Medicine & Rehabilitation

## 2016-08-19 ENCOUNTER — Telehealth: Payer: Self-pay | Admitting: Family

## 2016-08-19 NOTE — Telephone Encounter (Signed)
Pt called in and said that she needs refills on her meds.  She is back home now from the hospital.  She said that she will call back and do a fu with Marya Amsler once she is feeling better and can eat some.

## 2016-08-20 ENCOUNTER — Other Ambulatory Visit: Payer: Self-pay | Admitting: Hematology

## 2016-08-20 ENCOUNTER — Telehealth: Payer: Self-pay | Admitting: Family

## 2016-08-20 DIAGNOSIS — C189 Malignant neoplasm of colon, unspecified: Secondary | ICD-10-CM

## 2016-08-20 DIAGNOSIS — C562 Malignant neoplasm of left ovary: Secondary | ICD-10-CM

## 2016-08-20 MED ORDER — PREGABALIN 200 MG PO CAPS: ORAL_CAPSULE | ORAL | 0 refills | Status: DC

## 2016-08-20 NOTE — Telephone Encounter (Signed)
Refilled

## 2016-08-20 NOTE — Telephone Encounter (Signed)
Pt is requesting a refill of lyrica. Please advise

## 2016-08-20 NOTE — Telephone Encounter (Signed)
Patient states she is feeling much better.  States ER said she did not need to follow up with Marya Amsler if she was feeling better.  Patient states she does need refill on lyrica though.  Patient uses [facility] in [place].

## 2016-08-20 NOTE — Telephone Encounter (Signed)
Rx sent. Pt aware.  

## 2016-08-20 NOTE — Telephone Encounter (Signed)
Medication printed to be faxed.  

## 2016-09-04 ENCOUNTER — Encounter: Payer: Self-pay | Admitting: Family

## 2016-09-04 ENCOUNTER — Other Ambulatory Visit (INDEPENDENT_AMBULATORY_CARE_PROVIDER_SITE_OTHER): Payer: Medicare Other

## 2016-09-04 ENCOUNTER — Ambulatory Visit (INDEPENDENT_AMBULATORY_CARE_PROVIDER_SITE_OTHER): Payer: Medicare Other | Admitting: Family

## 2016-09-04 VITALS — BP 160/88 | HR 72 | Temp 98.2°F | Resp 16 | Ht 67.0 in | Wt 189.0 lb

## 2016-09-04 DIAGNOSIS — Z7289 Other problems related to lifestyle: Secondary | ICD-10-CM | POA: Diagnosis not present

## 2016-09-04 DIAGNOSIS — I10 Essential (primary) hypertension: Secondary | ICD-10-CM

## 2016-09-04 DIAGNOSIS — R945 Abnormal results of liver function studies: Secondary | ICD-10-CM | POA: Diagnosis not present

## 2016-09-04 DIAGNOSIS — Z0000 Encounter for general adult medical examination without abnormal findings: Secondary | ICD-10-CM | POA: Diagnosis not present

## 2016-09-04 DIAGNOSIS — E559 Vitamin D deficiency, unspecified: Secondary | ICD-10-CM

## 2016-09-04 DIAGNOSIS — Z23 Encounter for immunization: Secondary | ICD-10-CM

## 2016-09-04 LAB — LIPID PANEL
CHOL/HDL RATIO: 3
Cholesterol: 169 mg/dL (ref 0–200)
HDL: 50.4 mg/dL (ref >39.00)
LDL Cholesterol: 98 mg/dL (ref 0–99)
NONHDL: 118.32
Triglycerides: 103 mg/dL (ref 0.0–149.0)
VLDL: 20.6 mg/dL (ref 0.0–40.0)

## 2016-09-04 LAB — VITAMIN D 25 HYDROXY (VIT D DEFICIENCY, FRACTURES): VITD: 19.48 ng/mL — AB (ref 30.00–100.00)

## 2016-09-04 MED ORDER — DICYCLOMINE HCL 10 MG PO CAPS: 10.0000 mg | ORAL_CAPSULE | Freq: Three times a day (TID) | ORAL | 0 refills | Status: DC

## 2016-09-04 MED ORDER — AMLODIPINE BESYLATE 10 MG PO TABS: 10.0000 mg | ORAL_TABLET | Freq: Every day | ORAL | 1 refills | Status: DC

## 2016-09-04 MED ORDER — ONDANSETRON 4 MG PO TBDP: 4.0000 mg | ORAL_TABLET | Freq: Three times a day (TID) | ORAL | 0 refills | Status: DC | PRN

## 2016-09-04 NOTE — Assessment & Plan Note (Signed)
Reviewed and updated patient's medical, surgical, family and social history. Medications and allergies were also reviewed. Basic screenings for depression, activities of daily living, hearing, cognition and safety were performed. Provider list was updated and health plan was provided to the patient.  

## 2016-09-04 NOTE — Assessment & Plan Note (Signed)
1) Anticipatory Guidance: Discussed importance of wearing a seatbelt while driving and not texting while driving; changing batteries in smoke detector at least once annually; wearing suntan lotion when outside; eating a balanced and moderate diet; getting physical activity at least 30 minutes per day.  2) Immunizations / Screenings / Labs:  Declines influenza and tetanus updated today. All other immunizations are up-to-date per recommendations. Due for a dental and vision screen encouraged to be completed independently. Obtain hepatitis C antibody for hepatitis C screening. Obtain vitamin D for vitamin D deficiency screening. All other screenings are up-to-date per recommendations. Previously completed blood work reviewed with no significant irregularities.  Overall well exam with risk factors for cardiovascular disease including hypertension and overweight. Blood pressure remains slightly elevated above goal of 140/90 with blood pressure medication started. Recommend weight loss of 5-10% of current body weight through nutrition and physical activity. Discussed and educated regarding risks of continued use of marijuana. Continue other healthy lifestyle behaviors and choices. Follow-up prevention exam in 1 year. Follow-up office visit for chronic conditions as indicated.

## 2016-09-04 NOTE — Patient Instructions (Signed)
Thank you for choosing [facility].  SUMMARY AND INSTRUCTIONS:  Medication:  Please start the dicyclomine for stomach.   Your prescription(s) have been submitted to your pharmacy or been printed and provided for you. Please take as directed and contact our office if you believe you are having problem(s) with the medication(s) or have any questions.  Labs:  Please stop by the lab on the lower level of the building for your blood work. Your results will be released to [place] (or called to you) after review, usually within 72 hours after test completion. If any changes need to be made, you will be notified at that same time.  1.) The lab is open from 7:30am to 5:30 pm Monday-Friday 2.) No appointment is necessary 3.) Fasting (if needed) is 6-8 hours after food and drink; black coffee and water are okay    Follow up:  If your symptoms worsen or fail to improve, please contact our office for further instruction, or in case of emergency go directly to the emergency room at the closest medical facility.   Health Maintenance, Female Adopting a healthy lifestyle and getting preventive care can go a long way to promote health and wellness. Talk with your health care provider about what schedule of regular examinations is right for you. This is a good chance for you to check in with your provider about disease prevention and staying healthy. In between checkups, there are plenty of things you can do on your own. Experts have done a lot of research about which lifestyle changes and preventive measures are most likely to keep you healthy. Ask your health care provider for more information. WEIGHT AND DIET  Eat a healthy diet  Be sure to include plenty of vegetables, fruits, low-fat dairy products, and lean protein.  Do not eat a lot of foods high in solid fats, added sugars, or salt.  Get regular exercise. This is one of the most important things you can do for your health.  Most adults  should exercise for at least 150 minutes each week. The exercise should increase your heart rate and make you sweat (moderate-intensity exercise).  Most adults should also do strengthening exercises at least twice a week. This is in addition to the moderate-intensity exercise.  Maintain a healthy weight  Body mass index (BMI) is a measurement that can be used to identify possible weight problems. It estimates body fat based on height and weight. Your health care provider can help determine your BMI and help you achieve or maintain a healthy weight.  For females 55 years of age and older:   A BMI below 18.5 is considered underweight.  A BMI of 18.5 to 24.9 is normal.  A BMI of 25 to 29.9 is considered overweight.  A BMI of 30 and above is considered obese.  Watch levels of cholesterol and blood lipids  You should start having your blood tested for lipids and cholesterol at 55 years of age, then have this test every 5 years.  You may need to have your cholesterol levels checked more often if:  Your lipid or cholesterol levels are high.  You are older than 55 years of age.  You are at high risk for heart disease.  CANCER SCREENING   Lung Cancer  Lung cancer screening is recommended for adults 61-55 years old who are at high risk for lung cancer because of a history of smoking.  A yearly low-dose CT scan of the lungs is recommended for people  who:  Currently smoke.  Have quit within the past 15 years.  Have at least a 30-pack-year history of smoking. A pack year is smoking an average of one pack of cigarettes a day for 1 year.  Yearly screening should continue until it has been 15 years since you quit.  Yearly screening should stop if you develop a health problem that would prevent you from having lung cancer treatment.  Breast Cancer  Practice breast self-awareness. This means understanding how your breasts normally appear and feel.  It also means doing regular  breast self-exams. Let your health care provider know about any changes, no matter how small.  If you are in your 55s or 30s, you should have a clinical breast exam (CBE) by a health care provider every 1-3 years as part of a regular health exam.  If you are 55 or older, have a CBE every year. Also consider having a breast X-ray (mammogram) every year.  If you have a family history of breast cancer, talk to your health care provider about genetic screening.  If you are at high risk for breast cancer, talk to your health care provider about having an MRI and a mammogram every year.  Breast cancer gene (BRCA) assessment is recommended for women who have family members with BRCA-related cancers. BRCA-related cancers include:  Breast.  Ovarian.  Tubal.  Peritoneal cancers.  Results of the assessment will determine the need for genetic counseling and BRCA1 and BRCA2 testing. Cervical Cancer Your health care provider may recommend that you be screened regularly for cancer of the pelvic organs (ovaries, uterus, and vagina). This screening involves a pelvic examination, including checking for microscopic changes to the surface of your cervix (Pap test). You may be encouraged to have this screening done every 3 years, beginning at age 55.  For women ages 11-65, health care providers may recommend pelvic exams and Pap testing every 3 years, or they may recommend the Pap and pelvic exam, combined with testing for human papilloma virus (HPV), every 5 years. Some types of HPV increase your risk of cervical cancer. Testing for HPV may also be done on women of any age with unclear Pap test results.  Other health care providers may not recommend any screening for nonpregnant women who are considered low risk for pelvic cancer and who do not have symptoms. Ask your health care provider if a screening pelvic exam is right for you.  If you have had past treatment for cervical cancer or a condition that  could lead to cancer, you need Pap tests and screening for cancer for at least 20 years after your treatment. If Pap tests have been discontinued, your risk factors (such as having a new sexual partner) need to be reassessed to determine if screening should resume. Some women have medical problems that increase the chance of getting cervical cancer. In these cases, your health care provider may recommend more frequent screening and Pap tests. Colorectal Cancer  This type of cancer can be detected and often prevented.  Routine colorectal cancer screening usually begins at 55 years of age and continues through 55 years of age.  Your health care provider may recommend screening at an earlier age if you have risk factors for colon cancer.  Your health care provider may also recommend using home test kits to check for hidden blood in the stool.  A small camera at the end of a tube can be used to examine your colon directly (sigmoidoscopy or colonoscopy).  This is done to check for the earliest forms of colorectal cancer.  Routine screening usually begins at age 53.  Direct examination of the colon should be repeated every 5-10 years through 55 years of age. However, you may need to be screened more often if early forms of precancerous polyps or small growths are found. Skin Cancer  Check your skin from head to toe regularly.  Tell your health care provider about any new moles or changes in moles, especially if there is a change in a mole's shape or color.  Also tell your health care provider if you have a mole that is larger than the size of a pencil eraser.  Always use sunscreen. Apply sunscreen liberally and repeatedly throughout the day.  Protect yourself by wearing long sleeves, pants, a wide-brimmed hat, and sunglasses whenever you are outside. HEART DISEASE, DIABETES, AND HIGH BLOOD PRESSURE   High blood pressure causes heart disease and increases the risk of stroke. High blood pressure  is more likely to develop in:  People who have blood pressure in the high end of the normal range (130-139/85-89 mm Hg).  People who are overweight or obese.  People who are African American.  If you are 31-89 years of age, have your blood pressure checked every 3-5 years. If you are 2 years of age or older, have your blood pressure checked every year. You should have your blood pressure measured twice--once when you are at a hospital or clinic, and once when you are not at a hospital or clinic. Record the average of the two measurements. To check your blood pressure when you are not at a hospital or clinic, you can use:  An automated blood pressure machine at a pharmacy.  A home blood pressure monitor.  If you are between 62 years and 73 years old, ask your health care provider if you should take aspirin to prevent strokes.  Have regular diabetes screenings. This involves taking a blood sample to check your fasting blood sugar level.  If you are at a normal weight and have a low risk for diabetes, have this test once every three years after 55 years of age.  If you are overweight and have a high risk for diabetes, consider being tested at a younger age or more often. PREVENTING INFECTION  Hepatitis B  If you have a higher risk for hepatitis B, you should be screened for this virus. You are considered at high risk for hepatitis B if:  You were born in a country where hepatitis B is common. Ask your health care provider which countries are considered high risk.  Your parents were born in a high-risk country, and you have not been immunized against hepatitis B (hepatitis B vaccine).  You have HIV or AIDS.  You use needles to inject street drugs.  You live with someone who has hepatitis B.  You have had sex with someone who has hepatitis B.  You get hemodialysis treatment.  You take certain medicines for conditions, including cancer, organ transplantation, and autoimmune  conditions. Hepatitis C  Blood testing is recommended for:  Everyone born from [birth year] through 1965.  Anyone with known risk factors for hepatitis C. Sexually transmitted infections (STIs)  You should be screened for sexually transmitted infections (STIs) including gonorrhea and chlamydia if:  You are sexually active and are younger than 55 years of age.  You are older than 55 years of age and your health care provider tells you that you are  at risk for this type of infection.  Your sexual activity has changed since you were last screened and you are at an increased risk for chlamydia or gonorrhea. Ask your health care provider if you are at risk.  If you do not have HIV, but are at risk, it may be recommended that you take a prescription medicine daily to prevent HIV infection. This is called pre-exposure prophylaxis (PrEP). You are considered at risk if:  You are sexually active and do not regularly use condoms or know the HIV status of your partner(s).  You take drugs by injection.  You are sexually active with a partner who has HIV. Talk with your health care provider about whether you are at high risk of being infected with HIV. If you choose to begin PrEP, you should first be tested for HIV. You should then be tested every 3 months for as long as you are taking PrEP.  PREGNANCY   If you are premenopausal and you may become pregnant, ask your health care provider about preconception counseling.  If you may become pregnant, take 400 to 800 micrograms (mcg) of folic acid every day.  If you want to prevent pregnancy, talk to your health care provider about birth control (contraception). OSTEOPOROSIS AND MENOPAUSE   Osteoporosis is a disease in which the bones lose minerals and strength with aging. This can result in serious bone fractures. Your risk for osteoporosis can be identified using a bone density scan.  If you are 28 years of age or older, or if you are at risk for  osteoporosis and fractures, ask your health care provider if you should be screened.  Ask your health care provider whether you should take a calcium or vitamin D supplement to lower your risk for osteoporosis.  Menopause may have certain physical symptoms and risks.  Hormone replacement therapy may reduce some of these symptoms and risks. Talk to your health care provider about whether hormone replacement therapy is right for you.  HOME CARE INSTRUCTIONS   Schedule regular health, dental, and eye exams.  Stay current with your immunizations.   Do not use any tobacco products including cigarettes, chewing tobacco, or electronic cigarettes.  If you are pregnant, do not drink alcohol.  If you are breastfeeding, limit how much and how often you drink alcohol.  Limit alcohol intake to no more than 1 drink per day for nonpregnant women. One drink equals 12 ounces of beer, 5 ounces of wine, or 1 ounces of hard liquor.  Do not use street drugs.  Do not share needles.  Ask your health care provider for help if you need support or information about quitting drugs.  Tell your health care provider if you often feel depressed.  Tell your health care provider if you have ever been abused or do not feel safe at home.   This information is not intended to replace advice given to you by your health care provider. Make sure you discuss any questions you have with your health care provider.   Document Released: 05/26/2011 Document Revised: 12/01/2014 Document Reviewed: 10/12/2013  Health Maintenance  Topic Date Due  . Hepatitis C Screening  01-Mar-1961  . HIV Screening  02/02/1976  . TETANUS/TDAP  02/02/1980  . COLONOSCOPY  02/02/2011  . INFLUENZA VACCINE  02/21/2017 (Originally 06/24/2016)  . MAMMOGRAM  08/16/2017  . PAP SMEAR  11/24/2017     Elsevier Interactive Patient Education [employer].

## 2016-09-04 NOTE — Progress Notes (Signed)
Subjective:    Patient ID: [name], female    DOB: 01/26/1961, 55 y.o.   MRN: [id number]  Chief Complaint  Patient presents with  . CPE    not fasting    HPI:  Felina Tello is a 55 y.o. female who presents today for a Medicare Annual Wellness/Physical exam.    1) Health Maintenance -   Diet - Averages about 3 meals plus snacks per day consisting of a regular diet; No caffeine intake   Exercise - Daily; working on home exercise therapy and walking.   2) Preventative Exams / Immunizations:  Dental -- Due for exam  Vision -- Due for exam   Health Maintenance  Topic Date Due  . Hepatitis C Screening  May 10, 1961  . HIV Screening  02/02/1976  . TETANUS/TDAP  02/02/1980  . COLONOSCOPY  02/02/2011  . INFLUENZA VACCINE  02/21/2017 (Originally 06/24/2016)  . MAMMOGRAM  08/16/2017  . PAP SMEAR  11/24/2017     Immunization History  Administered Date(s) Administered  . Tdap 09/04/2016    RISK FACTORS  Tobacco History  Smoking Status  . Never Smoker  Smokeless Tobacco  . Never Used    Comment: pack per month of cigars = 5     Cardiac risk factors: hypertension.  Depression Screen  Depression screen [facility] 2/9 09/04/2016  Decreased Interest 0  Down, Depressed, Hopeless 2  PHQ - 2 Score 2  Altered sleeping 1  Tired, decreased energy 1  Change in appetite 0  Feeling bad or failure about yourself  0  Trouble concentrating 1  Moving slowly or fidgety/restless 0  Suicidal thoughts 0  PHQ-9 Score 5  Difficult doing work/chores Not difficult at all     Activities of Daily Living In your present state of health, do you have any difficulty performing the following activities?:  Driving? No Managing money?  No Feeding yourself? No Getting from bed to chair? No Climbing a flight of stairs? No Preparing food and eating?: No Bathing or showering? No Getting dressed: No Getting to the toilet? No Using the toilet: No Moving around from place to place:  No In the past year have you fallen or had a near fall?:No   Home Safety Has smoke detector and wears seat belts. No firearms. No excess sun exposure. Are there smokers in your home (other than you)?  No Do you feel safe at home?  Yes  Hearing Difficulties: No Do you often ask people to speak up or repeat themselves? No Do you experience ringing or noises in your ears? No  Do you have difficulty understanding soft or whispered voices? No    Cognitive Testing  Alert? Yes   Normal Appearance? Yes  Oriented to person? Yes  Place? Yes   Time? Yes  Recall of three objects?  Yes  Can perform simple calculations? Yes  Displays appropriate judgment? Yes  Can read the correct time from a watch face? Yes  Do you feel that you have a problem with memory? No  Do you often misplace items? No   Advanced Directives have been discussed with the patient? Yes   Current Physicians/Providers and Suppliers  1. Terri Piedra, FNP - Internal Medicine 2. Truitt Merle, MD - Oncology 3. Roma Kayser - Genetic Counselor 4. Vinnie Level Nickel, NP - Vein and Vascular Surgery.  Indicate any recent Medical Services you may have received from other than [facility] providers in the past year (date may be approximate).  All answers were reviewed with  the patient and necessary referrals were made:  Mauricio Po, FNP   09/04/2016    Allergies  Allergen Reactions  . Other Hives and Itching    Highly acidic foods "make me itch and break out" oranges, tomatoes  . Demerol [Meperidine] Nausea And Vomiting  . Morphine And Related Nausea And Vomiting  . Oxycodone Itching  . Penicillins Other (See Comments)    Childhood allergy  . Tylenol [Acetaminophen] Nausea And Vomiting  . Ibuprofen Nausea And Vomiting     Outpatient Medications Prior to Visit  Medication Sig Dispense Refill  . docusate sodium 100 MG CAPS Take 100 mg by mouth 2 (two) times daily as needed for mild constipation. 10 capsule 0  . pregabalin  (LYRICA) 200 MG capsule take 1 capsule by mouth every 8 hours as directed. 90 capsule 0  . ondansetron (ZOFRAN ODT) 4 MG disintegrating tablet Take 1 tablet (4 mg total) by mouth every 8 (eight) hours as needed for nausea or vomiting. 20 tablet 0  . acetaminophen (TYLENOL) 500 MG tablet Take 500 mg by mouth every 6 (six) hours as needed for moderate pain.     No facility-administered medications prior to visit.      Past Medical History:  Diagnosis Date  . Anemia   . Anxiety   . Arthritis   . Asthma   . Cancer ([place])    colon and ovarian  . Complication of anesthesia    woke and saw long tube coming out of mouth . sat up then went back to sleep  . Depression   . Ectopic pregnancy   . Family history of adverse reaction to anesthesia    sister on dialysis- hypotension   . Family history of ovarian cancer   . GERD (gastroesophageal reflux disease)    occasional  . Head injury, closed, with brief LOC ([place])    had a cut- as a child  . Headache   . History of blood transfusion    2005 approximately   . Hypertension   . PAD (peripheral artery disease) ([place]) 04/18/2015  . Pelvic cyst   . PONV (postoperative nausea and vomiting)   . PTSD (post-traumatic stress disorder)      Past Surgical History:  Procedure Laterality Date  . ABDOMINAL HYSTERECTOMY    . COLONOSCOPY    . ESOPHAGOGASTRODUODENOSCOPY N/A 09/17/2014   Procedure: ESOPHAGOGASTRODUODENOSCOPY (EGD);  Surgeon: Jeryl Columbia, MD;  Location[facility] ENDOSCOPY;  Service: Endoscopy;  Laterality: N/A;  . FEMORAL-FEMORAL BYPASS GRAFT Bilateral 04/18/2015   Procedure:  RIGHT COMMON FEMORAL TO LEFT COMMON  FEMORAL  ARTERY BYPASS WITH  8 MM HEMASHIELD GRAFT,RIGHT COMMON SUPERFICIAL FEMORAL ARTERY ENDARTERECTOMY;  Surgeon: Elam Dutch, MD;  Location: [place];  Service: Vascular;  Laterality: Bilateral;  . ILEOSTOMY Right 09/18/2014   Procedure: ILEOCOLOSTOMY;  Surgeon: Doreen Salvage, MD;  Location: [place];  Service: General;  Laterality:  Right;  . LAPAROTOMY N/A 11/01/2014   Procedure: EXPLORATORY LAPAROTOMY;  Surgeon: Doreen Salvage, MD;  Location: [place];  Service: General;  Laterality: N/A;  . LAPAROTOMY N/A 12/26/2014   Procedure:  LAPAROTOMY ;  Surgeon: Everitt Amber, MD;  Location: [facility];  Service: Gynecology;  Laterality: N/A;  . MASS EXCISION  11/01/2014   Procedure: EXCISION PELVIC MASS;  Surgeon: Doreen Salvage, MD;  Location: [place];  Service: General;;  . OOPHORECTOMY  2016  . PARTIAL COLECTOMY Right 09/18/2014   Procedure: RIGHT HEMI COLECTOMY;  Surgeon: Doreen Salvage, MD;  Location: [place];  Service: General;  Laterality: Right;  . ROBOTIC ASSISTED TOTAL HYSTERECTOMY WITH BILATERAL SALPINGO OOPHERECTOMY Right 12/26/2014   Procedure: EXPLORATORY LAPAROSCOPY  RIGHT SALPINGO OOPHORECTOMY OMENTECTOMY LYMPHADECTOMY ;  Surgeon: Everitt Amber, MD;  Location: [facility];  Service: Gynecology;  Laterality: Right;  . TUBAL LIGATION       Family History  Problem Relation Age of Onset  . Brain cancer Sister 32  . Lung cancer Brother 41  . Heart attack Mother   . Heart attack Father     78s  . Heart attack Maternal Uncle   . Diabetes Maternal Uncle   . Ovarian cancer Paternal Aunt     3 paternal aunts with ovarian cancer  . Heart attack Brother   . Diabetes Paternal Uncle      Social History   Social History  . Marital status: Widowed    Spouse name: N/A  . Number of children: 6  . Years of education: 57   Occupational History  . Disability    Social History Main Topics  . Smoking status: Never Smoker  . Smokeless tobacco: Never Used     Comment: pack per month of cigars = 5  . Alcohol use No  . Drug use:     Frequency: 5.0 times per week    Types: Marijuana     Comment: 04/17/15- 2 weeks ago  . Sexual activity: No   Other Topics Concern  . Not on file   Social History Narrative   Born and raised in [place], [place]. Currently resides in a house by herself. No pets. Fun: Read, shoot pool.    Denies religious beliefs that  would effect health care.    Lives at home alone.   Right-handed.   No caffeine use.   Denies abuse and feels safe at home.      Review of Systems  Constitutional: Denies fever, chills, fatigue, or significant weight gain/loss. HENT: Head: Denies headache or neck pain Ears: Denies changes in hearing, ringing in ears, earache, drainage Nose: Denies discharge, stuffiness, itching, nosebleed, sinus pain Throat: Denies sore throat, hoarseness, dry mouth, sores, thrush Eyes: Denies loss/changes in vision, pain, redness, blurry/double vision, flashing lights Cardiovascular: Denies chest pain/discomfort, tightness, palpitations, shortness of breath with activity, difficulty lying down, swelling, sudden awakening with shortness of breath Respiratory: Denies shortness of breath, cough, sputum production, wheezing Gastrointestinal: Denies dysphasia, heartburn, change in appetite, nausea, change in bowel habits, rectal bleeding, constipation, diarrhea, yellow skin or eyes Genitourinary: Denies frequency, urgency, burning/pain, blood in urine, incontinence, change in urinary strength. Musculoskeletal: Denies muscle/joint pain, stiffness, back pain, redness or swelling of joints, trauma Skin: Denies rashes, lumps, itching, dryness, color changes, or hair/nail changes Neurological: Denies dizziness, fainting, seizures, weakness, numbness, tingling, tremor Psychiatric - Denies nervousness, stress, depression or memory loss Endocrine: Denies heat or cold intolerance, sweating, frequent urination, excessive thirst, changes in appetite Hematologic: Denies ease of bruising or bleeding    Objective:     BP (!) 160/88 (BP Location: Left Arm, Patient Position: Sitting, Cuff Size: Normal)   Pulse 72   Temp 98.2 F (36.8 C) (Oral)   Resp 16   Ht '5\' 7"'$  (1.702 m)   Wt 189 lb (85.7 kg)   SpO2 99%   BMI 29.60 kg/m  Nursing note and vital signs reviewed.  Physical Exam  Constitutional: She is oriented  to person, place, and time. She appears well-developed and well-nourished.  HENT:  Head: Normocephalic.  Right Ear: Hearing, tympanic membrane, external ear  and ear canal normal.  Left Ear: Hearing, tympanic membrane, external ear and ear canal normal.  Nose: Nose normal.  Mouth/Throat: Uvula is midline, oropharynx is clear and moist and mucous membranes are normal.  Eyes: Conjunctivae and EOM are normal. Pupils are equal, round, and reactive to light.  Neck: Neck supple. No JVD present. No tracheal deviation present. No thyromegaly present.  Cardiovascular: Normal rate, regular rhythm, normal heart sounds and intact distal pulses.   Pulmonary/Chest: Effort normal and breath sounds normal.  Abdominal: Soft. Bowel sounds are normal. She exhibits no distension and no mass. There is no tenderness. There is no rebound and no guarding.  Musculoskeletal: Normal range of motion. She exhibits no edema or tenderness.  Lymphadenopathy:    She has no cervical adenopathy.  Neurological: She is alert and oriented to person, place, and time. She has normal reflexes. No cranial nerve deficit. She exhibits normal muscle tone. Coordination normal.  Skin: Skin is warm and dry.  Psychiatric: She has a normal mood and affect. Her behavior is normal. Judgment and thought content normal.       Assessment & Plan:   During the course of the visit the patient was educated and counseled about appropriate screening and preventive services including:    Pneumococcal vaccine   Influenza vaccine  Colorectal cancer screening  Nutrition counseling   Diet review for nutrition referral? Yes ____  Not Indicated _X___   Patient Instructions (the written plan) was given to the patient.  Medicare Attestation I have personally reviewed: The patient's medical and social history Their use of alcohol, tobacco or illicit drugs Their current medications and supplements The patient's functional ability including  ADLs,fall risks, home safety risks, cognitive, and hearing and visual impairment Diet and physical activities Evidence for depression or mood disorders  The patient's weight, height, BMI,  have been recorded in the chart.  I have made referrals, counseling, and provided education to the patient based on review of the above and I have provided the patient with a written personalized care plan for preventive services.     Problem List Items Addressed This Visit      Cardiovascular and Mediastinum   Hypertension    Hypertension remains elevated above goal 140/90. Start amlodipine. Encouraged to decrease sodium intake. Continue monitor blood pressure at home as able. Denies worse headache of life or symptoms of end organ damage.      Relevant Medications   amLODipine (NORVASC) 10 MG tablet   Other Relevant Orders   Lipid panel (Completed)     Other   Routine general medical examination at a health care facility    1) Anticipatory Guidance: Discussed importance of wearing a seatbelt while driving and not texting while driving; changing batteries in smoke detector at least once annually; wearing suntan lotion when outside; eating a balanced and moderate diet; getting physical activity at least 30 minutes per day.  2) Immunizations / Screenings / Labs:  Declines influenza and tetanus updated today. All other immunizations are up-to-date per recommendations. Due for a dental and vision screen encouraged to be completed independently. Obtain hepatitis C antibody for hepatitis C screening. Obtain vitamin D for vitamin D deficiency screening. All other screenings are up-to-date per recommendations. Previously completed blood work reviewed with no significant irregularities.  Overall well exam with risk factors for cardiovascular disease including hypertension and overweight. Blood pressure remains slightly elevated above goal of 140/90 with blood pressure medication started. Recommend weight loss of  5-10% of current  body weight through nutrition and physical activity. Discussed and educated regarding risks of continued use of marijuana. Continue other healthy lifestyle behaviors and choices. Follow-up prevention exam in 1 year. Follow-up office visit for chronic conditions as indicated.       Vitamin D deficiency   Relevant Orders   VITAMIN D 25 Hydroxy (Vit-D Deficiency, Fractures) (Completed)   Medicare annual wellness visit, subsequent - Primary    Reviewed and updated patient's medical, surgical, family and social history. Medications and allergies were also reviewed. Basic screenings for depression, activities of daily living, hearing, cognition and safety were performed. Provider list was updated and health plan was provided to the patient.        Other Visit Diagnoses    Other problems related to lifestyle       Relevant Orders   Hepatitis C antibody   Need for Tdap vaccination       Relevant Orders   Tdap vaccine greater than or equal to 7yo IM (Completed)       I have discontinued Ms. Strough's acetaminophen. I am also having her start on dicyclomine and amLODipine. Additionally, I am having her maintain her DSS, pregabalin, and ondansetron.   Meds ordered this encounter  Medications  . ondansetron (ZOFRAN ODT) 4 MG disintegrating tablet    Sig: Take 1 tablet (4 mg total) by mouth every 8 (eight) hours as needed for nausea or vomiting.    Dispense:  20 tablet    Refill:  0    Order Specific Question:   Supervising Provider    Answer:   Pricilla Holm A [[id number]]  . dicyclomine (BENTYL) 10 MG capsule    Sig: Take 1 capsule (10 mg total) by mouth 4 (four) times daily -  before meals and at bedtime.    Dispense:  60 capsule    Refill:  0    Order Specific Question:   Supervising Provider    Answer:   Pricilla Holm A [[id number]]  . amLODipine (NORVASC) 10 MG tablet    Sig: Take 1 tablet (10 mg total) by mouth daily.    Dispense:  30 tablet    Refill:  1     Order Specific Question:   Supervising Provider    Answer:   Pricilla Holm A [[id number]]     Follow-up: Return if symptoms worsen or fail to improve.   Mauricio Po, FNP

## 2016-09-04 NOTE — Assessment & Plan Note (Signed)
Hypertension remains elevated above goal 140/90. Start amlodipine. Encouraged to decrease sodium intake. Continue monitor blood pressure at home as able. Denies worse headache of life or symptoms of end organ damage.

## 2016-09-05 ENCOUNTER — Other Ambulatory Visit: Payer: Self-pay | Admitting: Family

## 2016-09-05 LAB — HEPATITIS C ANTIBODY: HCV Ab: REACTIVE — AB

## 2016-09-05 MED ORDER — ONDANSETRON 4 MG PO TBDP: 4.0000 mg | ORAL_TABLET | Freq: Three times a day (TID) | ORAL | 0 refills | Status: DC | PRN

## 2016-09-08 ENCOUNTER — Encounter: Payer: Self-pay | Admitting: Family

## 2016-09-09 ENCOUNTER — Other Ambulatory Visit: Payer: Self-pay | Admitting: Family

## 2016-09-09 DIAGNOSIS — R768 Other specified abnormal immunological findings in serum: Secondary | ICD-10-CM

## 2016-09-09 LAB — HEPATITIS C RNA QUANTITATIVE
HCV QUANT LOG: 7.25 {Log} — AB (ref <1.18)
HCV QUANT: 17821139 [IU]/mL — AB (ref <15)

## 2016-09-09 NOTE — Progress Notes (Signed)
Refer

## 2016-09-25 ENCOUNTER — Other Ambulatory Visit: Payer: Self-pay | Admitting: Family

## 2016-09-25 NOTE — Telephone Encounter (Signed)
Last refill was 08/20/16

## 2016-09-26 NOTE — Telephone Encounter (Signed)
rx faxed to pharmacy. Pt informed of same via vm.

## 2016-09-26 NOTE — Telephone Encounter (Signed)
Rec'd call pt requesting status on Lyrica...Catherine Rogers

## 2016-10-15 ENCOUNTER — Encounter: Payer: Self-pay | Admitting: Family

## 2016-10-23 ENCOUNTER — Ambulatory Visit (INDEPENDENT_AMBULATORY_CARE_PROVIDER_SITE_OTHER): Payer: Medicare Other | Admitting: Family

## 2016-10-23 ENCOUNTER — Encounter: Payer: Self-pay | Admitting: Family

## 2016-10-23 ENCOUNTER — Ambulatory Visit (HOSPITAL_COMMUNITY)
Admission: RE | Admit: 2016-10-23 | Discharge: 2016-10-23 | Disposition: A | Discharge status: Home / Self Care | Payer: Medicare Other | Source: Ambulatory Visit | Attending: Family | Admitting: Family

## 2016-10-23 VITALS — BP 140/79 | HR 65 | Temp 98.0°F | Resp 16 | Ht 67.0 in | Wt 187.0 lb

## 2016-10-23 DIAGNOSIS — I739 Peripheral vascular disease, unspecified: Secondary | ICD-10-CM | POA: Insufficient documentation

## 2016-10-23 DIAGNOSIS — Z4889 Encounter for other specified surgical aftercare: Secondary | ICD-10-CM | POA: Diagnosis not present

## 2016-10-23 DIAGNOSIS — Z48812 Encounter for surgical aftercare following surgery on the circulatory system: Secondary | ICD-10-CM

## 2016-10-23 DIAGNOSIS — Z95828 Presence of other vascular implants and grafts: Secondary | ICD-10-CM

## 2016-10-23 DIAGNOSIS — I779 Disorder of arteries and arterioles, unspecified: Secondary | ICD-10-CM

## 2016-10-23 NOTE — Patient Instructions (Signed)

## 2016-10-23 NOTE — Progress Notes (Signed)
[facility] [place]   CC: Follow up peripheral artery occlusive disease  History of Present Illness Catherine Rogers is a 55 y.o. female patient of Dr. Oneida Alar who is s/p right to left femoral-femoral bypass on 04/18/2015. This was done primarily for rest pain in the left foot. She continues to have pain in the left foot despite revascularization. She also still has some edema in the left foot. She states that the foot is 70% better than preoperatively. Her incisions are well-healed. Her claudication in the left leg has completely resolved. Recent left foot x-ray showed no evidence of fracture but demineralization possibly reflex sympathetic dystrophy (RSD).  She has weaned off narcotic pain medication for her left foot. She states that her foot hurts day and night and feels cool, Lyrica is helping this. She has also noticed changes in the shape of her foot due to changes in her gait secondary to pain.  Dr. Oneida Alar last saw pt on 09/13/15. At that time patient had a duplex ultrasound 1 month prior of her femorofemoral bypass which suggested possible inflow narrowing of the femorofemoral. She had a CT angiogram of the abdomen and pelvis with lower extremity runoff that day. This showed a widely patent femoral-femoral bypass with completely intact runoff to the left lower extremity without any narrowing of her lower extremity arteries. She did have mild narrowing of the right superficial femoral artery. Persistent left foot pain despite revascularization. Recent bone scan suggested reflux sympathetic dystrophy. I do not believe all of her current pain symptoms are related to vascular disease. She has previously had a neurologic evaluation which suggests that most of this pain probably is neurologic in origin from her previous extensive operation for ovarian and colon cancer. She had an extensive workup by neurology in May of 2016. She has had a referral to the pain clinic in May 2016,  August 2016 and from our office in October 2016. Dr. Oneida Alar called the pain Center in October 2016 at [phone] and spoke with their manager. Dr. Oneida Alar did not prescribe any narcotic pain medication at the October 2016 visit as he does not believe her pain symptoms were related to peripheral arterial disease. She was to follow-up with [place] in 6 months for repeat ABIs.   She returns today for 6 months follow up. Pt states she did have an appointment with the pain clinic, states she did not want to take any prescription pain medications and is not attending any longer.  She takes Lyrica, occasional Extra Strength Tylenol.  Pt has multiple complaints, but states she is happy and glad she is doing as well as she is. She exercises her legs daily as prescribed by physical therapy.   Pt Diabetic: No Pt smoker: states she never smoked tobacco, currently smokes marijuana which helps with the abdominal and left leg pain and helps her sleep.  Pt meds include: Statin :No Betablocker: No ASA: No Other anticoagulants/antiplatelets: no     Past Medical History:  Diagnosis Date  . Anemia   . Anxiety   . Arthritis   . Asthma   . Cancer ([place])    colon and ovarian  . Complication of anesthesia    woke and saw long tube coming out of mouth . sat up then went back to sleep  . Depression   . Ectopic pregnancy   . Family history of adverse reaction to anesthesia    sister on dialysis- hypotension   . Family history of ovarian cancer   .  GERD (gastroesophageal reflux disease)    occasional  . Head injury, closed, with brief LOC ([place])    had a cut- as a child  . Headache   . History of blood transfusion    2005 approximately   . Hypertension   . PAD (peripheral artery disease) ([place]) 04/18/2015  . Pelvic cyst   . PONV (postoperative nausea and vomiting)   . PTSD (post-traumatic stress disorder)     Social History Social History  Substance Use Topics  . Smoking status: Never Smoker  .  Smokeless tobacco: Never Used     Comment: pack per month of cigars = 5  . Alcohol use No    Family History Family History  Problem Relation Age of Onset  . Brain cancer Sister 20  . Lung cancer Brother 28  . Heart attack Mother   . Heart attack Father     63s  . Heart attack Maternal Uncle   . Diabetes Maternal Uncle   . Ovarian cancer Paternal Aunt     3 paternal aunts with ovarian cancer  . Heart attack Brother   . Diabetes Paternal Uncle     Past Surgical History:  Procedure Laterality Date  . ABDOMINAL HYSTERECTOMY    . COLONOSCOPY    . ESOPHAGOGASTRODUODENOSCOPY N/A 09/17/2014   Procedure: ESOPHAGOGASTRODUODENOSCOPY (EGD);  Surgeon: Jeryl Columbia, MD;  Location[facility] ENDOSCOPY;  Service: Endoscopy;  Laterality: N/A;  . FEMORAL-FEMORAL BYPASS GRAFT Bilateral 04/18/2015   Procedure:  RIGHT COMMON FEMORAL TO LEFT COMMON  FEMORAL  ARTERY BYPASS WITH  8 MM HEMASHIELD GRAFT,RIGHT COMMON SUPERFICIAL FEMORAL ARTERY ENDARTERECTOMY;  Surgeon: Elam Dutch, MD;  Location: [place];  Service: Vascular;  Laterality: Bilateral;  . ILEOSTOMY Right 09/18/2014   Procedure: ILEOCOLOSTOMY;  Surgeon: Catherine Salvage, MD;  Location: [place];  Service: General;  Laterality: Right;  . LAPAROTOMY N/A 11/01/2014   Procedure: EXPLORATORY LAPAROTOMY;  Surgeon: Catherine Salvage, MD;  Location: [place];  Service: General;  Laterality: N/A;  . LAPAROTOMY N/A 12/26/2014   Procedure:  LAPAROTOMY ;  Surgeon: Everitt Amber, MD;  Location: [facility];  Service: Gynecology;  Laterality: N/A;  . MASS EXCISION  11/01/2014   Procedure: EXCISION PELVIC MASS;  Surgeon: Catherine Salvage, MD;  Location: [place];  Service: General;;  . OOPHORECTOMY  2016  . PARTIAL COLECTOMY Right 09/18/2014   Procedure: RIGHT HEMI COLECTOMY;  Surgeon: Catherine Salvage, MD;  Location: [place];  Service: General;  Laterality: Right;  . ROBOTIC ASSISTED TOTAL HYSTERECTOMY WITH BILATERAL SALPINGO OOPHERECTOMY Right 12/26/2014   Procedure: EXPLORATORY LAPAROSCOPY  RIGHT SALPINGO  OOPHORECTOMY OMENTECTOMY LYMPHADECTOMY ;  Surgeon: Everitt Amber, MD;  Location: [facility];  Service: Gynecology;  Laterality: Right;  . TUBAL LIGATION      Allergies  Allergen Reactions  . Other Hives and Itching    Highly acidic foods "make me itch and break out" oranges, tomatoes  . Demerol [Meperidine] Nausea And Vomiting  . Morphine And Related Nausea And Vomiting  . Oxycodone Itching  . Penicillins Other (See Comments)    Childhood allergy  . Tylenol [Acetaminophen] Nausea And Vomiting  . Ibuprofen Nausea And Vomiting    Current Outpatient Prescriptions  Medication Sig Dispense Refill  . amLODipine (NORVASC) 10 MG tablet Take 1 tablet (10 mg total) by mouth daily. 30 tablet 1  . dicyclomine (BENTYL) 10 MG capsule Take 1 capsule (10 mg total) by mouth 4 (four) times daily -  before meals and at bedtime. 60 capsule 0  .  docusate sodium 100 MG CAPS Take 100 mg by mouth 2 (two) times daily as needed for mild constipation. 10 capsule 0  . LYRICA 200 MG capsule take 1 capsule by mouth every 8 hours as directed by prescriber 90 capsule 0  . ondansetron (ZOFRAN ODT) 4 MG disintegrating tablet Take 1 tablet (4 mg total) by mouth every 8 (eight) hours as needed for nausea or vomiting. 20 tablet 0   No current facility-administered medications for this visit.     ROS: See HPI for pertinent positives and negatives.   Physical Examination  Vitals:   10/23/16 1342 10/23/16 1346  BP: (!) 145/94 140/79  Pulse: 65   Resp: 16   Temp: 98 F (36.7 C)   SpO2: 99%   Weight: 187 lb (84.8 kg)   Height: '5\' 7"'$  (1.702 m)    Body mass index is 29.29 kg/m.  General: A&O x 3, WDWN. Gait: normal Eyes: PERRLA. Pulmonary: Respirations are non labored, CTAB, without wheezes , rales or rhonchi. Cardiac: regular rhythm, no detected murmur.         Carotid Bruits Right Left   Negative Negative  Aorta is not palpable. Radial pulses: 1+ palpable and =  Palpable fem-fem bypass graft pulse                           VASCULAR EXAM: Extremities without ischemic changes, without Gangrene; without open wounds.                                                                                                                                                       LE Pulses Right Left       FEMORAL  not palpable, tender to palpation  not palpable, tender to palpation       POPLITEAL  not palpable  not palpable, tender to touch       POSTERIOR TIBIAL  not palpable  not palpable, tender to touch       DORSALIS PEDIS      ANTERIOR TIBIAL 1+ palpable not palpable, tender to touch   Abdomen: soft, NT, no palpable masses. Skin: no rashes, no ulcers. Musculoskeletal: no muscle wasting or atrophy other than mild deformity of left foot.          Neurologic: A&O X 3; Appropriate Affect ; SENSATION: dysesthesia left lower extremity; MOTOR FUNCTION:  moving all extremities equally, motor strength 5/5 throughout. Speech is fluent/normal.  CN 2-12 intact.     ASSESSMENT: Catherine Rogers is a 55 y.o. female who is s/p right to left femoral-femoral bypass on 04/18/2015. This was done primarily for rest pain in the left foot. She continues to have pain in the left foot despite revascularization.  She states that the foot is 70% better than preoperatively.  Her  claudication in the left leg has completely resolved. Pt reports that she has been diligent about exercising daily as prescribed by physical therapy. She is able to walk a block without stopping; this has improved from a half block. She has weaned herself off narcotics and takes Lyrica with some degree of pain relief of the left leg and abdomen.    DATA ABI's: Right: 0.84 with bi and monophasic waveforms, slight improvement from 0.78 on 03-13-16, normal TBI of 0.76, stable compared to 03-13-16.            Left: 0.76 with monophasic waveforms, slight decline from 0.84 on 03-13-16, slight decline in TBI to 0.57 from 0.67  PLAN:  Based on the  patient's vascular studies and examination, pt will return to clinic in 6 months with ABI's. I advised her to return sooner if she develops concerns re the circulation in her feet/legs.   Continue graduated walking program and daily leg exercises.   I discussed in depth with the patient the nature of atherosclerosis, and emphasized the importance of maximal medical management including strict control of blood pressure, blood glucose, and lipid levels, obtaining regular exercise, and continued cessation of smoking.  The patient is aware that without maximal medical management the underlying atherosclerotic disease process will progress, limiting the benefit of any interventions.  The patient was given information about PAD including signs, symptoms, treatment, what symptoms should prompt the patient to seek immediate medical care, and risk reduction measures to take.  Clemon Chambers, RN, MSN, FNP-C [facility] Phone: [phone]  Clinic MD: Oneida Alar  10/23/16 2:05 PM

## 2016-10-28 ENCOUNTER — Other Ambulatory Visit: Payer: Self-pay | Admitting: Family

## 2016-10-29 NOTE — Telephone Encounter (Signed)
Rx faxed

## 2016-10-29 NOTE — Telephone Encounter (Signed)
Last refill was 09/26/16

## 2016-11-05 NOTE — Progress Notes (Signed)
[place]  Telephone:([phone]) [phone] Fax:(336) [phone]  Clinic follow Up Note   Patient Care Team: Golden Circle, FNP as PCP - General (Family Medicine) Truitt Merle, MD as Consulting Physician (Hematology) Judeth Horn, MD as Consulting Physician (General Surgery) Nat Math, MD as Referring Physician (Obstetrics and Gynecology) Elam Dutch, MD as Consulting Physician (Vascular Surgery) [name], MD as Attending Physician (Gynecologic Oncology) 11/06/2016   CHIEF COMPLAINTS/PURPOSE OF CONSULTATION:  1. Colon cancer, pT3N1M0, stage III 2. Ovarian cancer, pT1cNxM0, stage I  3. BRCA1 mutation (+)    Cancer of right colon ([place])   09/16/2014 Imaging    CT abd/pel:  a 3.8cm cecal mass with associated ileocecal intussusception, and a 9.1cm solid and cystic mass in the left pelvis,      09/18/2014 Initial Diagnosis    Adenocarcinoma of right colon      09/18/2014 Pathologic Stage    pT3pN1Mx, tumor extends into pericolonic soft tissue and is less than 82m from the serosal surface, LVI 8-), PNI (-), all 23 node negative, one soft tissue tumor deposit, surgical margins negative.       09/18/2014 Surgery    right hemicolectomy and terminal ileoectomy      08/06/2016 Imaging    CT chest/abd/pelvis with contrast IMPRESSION: 1. No recurrent malignancy identified. 2. 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 On the prior exam from 09/13/2015 there was back flow of contrast in the left external iliac artery to supply the left internal iliac artery ; this back flow of contrast is no longer present in the left external iliac artery is occluded. 3. Several pulmonary nodules at or below 4 mm diameter, unchanged. 4. 3 mm hypodensity posteriorly in the body of the pancreas, not well seen previously and subtle today, likely incidental, but merits observation. 5. 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 Prominent degenerative spurring of both acetabula. 6. Lumbar spondylosis and degenerative disc disease causing prominent impingement at L4-5 and mild impingement at L5-S1.       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 ([place]   10/09/2014 Imaging    CT of the abdomen and pelvis showed a 9.1 cm solid and cystic mass in the left pelvis concerning for malignancy. This was discovered during her workup for colon cancer.      11/01/2014 Initial Diagnosis    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      11/01/2014 Surgery    Salpingo-oophorectomy.      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 Tumor size 3.7 cm, no lymph nodes removed. T1cNx. Tumor cells are positive for cytokeratin 7 and estrogen receptor.        HISTORY OF PRESENTING ILLNESS:  [name]55y.o. female is here because of recently diagnosed colon cancer.   She presented with abdominal pain, nausea and vomiting, hemoptoses in Oct 2015.  [facility] scan that revealed a 3.8cm cecal mass with associated ileocecal intussusception, and a 9.1cm solid and cystic mass in the left pelvis, likely a peritoneal met with ascites.  EGD was negative. She had exploratory laparoscopy on 09/18/2014 by Dr. WHulen Skains and underwent right hemicolectomy and ileocecectomy. The pathology reviewed also related an invasive adenocarcinoma at the terminal ileum  and cecum. She was discharged home on 09/22/2014.  She underwent a second surgery for left pelvis cystic mass resection on 11/01/2014, surgical path showed ovarian cancer.  CURRENT TREATMENT:   Surveillance  INTERIM HISTORY: Catherine Rogers returns for follow-up. The patient has stopped taking her BP medication about one and a half days ago. She reports  that they were making her lose weight and "draining the water out of my body." She has not notified her PCP. She also reports left lower leg pain. Her form of exercise is walking and she tries to remain active. She reports mild peripheral neuropathy. Reports normal bowel habits, denies abdominal pain or nausea.  MEDICAL HISTORY:  Past Medical History:  Diagnosis Date  . Anemia   . Anxiety   . Arthritis   . Asthma   . Cancer ([place])    colon and ovarian  . Complication of anesthesia    woke and saw long tube coming out of mouth . sat up then went back to sleep  . Depression   . Ectopic pregnancy   . Family history of adverse reaction to anesthesia    sister on dialysis- hypotension   . Family history of ovarian cancer   . GERD (gastroesophageal reflux disease)    occasional  . Head injury, closed, with brief LOC ([place])    had a cut- as a child  . Headache   . History of blood transfusion    2005 approximately   . Hypertension   . PAD (peripheral artery disease) ([place]) 04/18/2015  . Pelvic cyst   . PONV (postoperative nausea and vomiting)   . PTSD (post-traumatic stress disorder)   PTSD   SURGICAL HISTORY: Past Surgical History:  Procedure Laterality Date  . ABDOMINAL HYSTERECTOMY    . COLONOSCOPY    . ESOPHAGOGASTRODUODENOSCOPY N/A 09/17/2014   Procedure: ESOPHAGOGASTRODUODENOSCOPY (EGD);  Surgeon: Jeryl Columbia, MD;  [facility];  Service: Endoscopy;  Laterality: N/A;  . FEMORAL-FEMORAL BYPASS GRAFT Bilateral 04/18/2015   Procedure:  RIGHT COMMON FEMORAL TO LEFT COMMON  FEMORAL  ARTERY BYPASS WITH  8 MM HEMASHIELD GRAFT,RIGHT COMMON SUPERFICIAL FEMORAL ARTERY ENDARTERECTOMY;  Surgeon: Elam Dutch, MD;  Location: [place];  Service: Vascular;  Laterality: Bilateral;  . ILEOSTOMY Right 09/18/2014   Procedure: ILEOCOLOSTOMY;  Surgeon: Doreen Salvage, MD;  Location: [place];  Service: General;  Laterality: Right;  . LAPAROTOMY N/A 11/01/2014   Procedure: EXPLORATORY LAPAROTOMY;   Surgeon: Doreen Salvage, MD;  Location: [place];  Service: General;  Laterality: N/A;  . LAPAROTOMY N/A 12/26/2014   Procedure:  LAPAROTOMY ;  Surgeon: Everitt Amber, MD;  Location: [facility];  Service: Gynecology;  Laterality: N/A;  . MASS EXCISION  11/01/2014   Procedure: EXCISION PELVIC MASS;  Surgeon: Doreen Salvage, MD;  Location: [place];  Service: General;;  . OOPHORECTOMY  2016  . PARTIAL COLECTOMY Right 09/18/2014   Procedure: RIGHT HEMI COLECTOMY;  Surgeon: Doreen Salvage, MD;  Location: [place];  Service: General;  Laterality: Right;  . ROBOTIC ASSISTED TOTAL HYSTERECTOMY WITH BILATERAL SALPINGO OOPHERECTOMY Right 12/26/2014   Procedure: EXPLORATORY LAPAROSCOPY  RIGHT SALPINGO OOPHORECTOMY OMENTECTOMY LYMPHADECTOMY ;  Surgeon: Everitt Amber, MD;  Location: [facility];  Service: Gynecology;  Laterality: Right;  . TUBAL LIGATION      SOCIAL HISTORY: Social History   Social History  . Marital status: Widowed    Spouse name: N/A  . Number of children: 6  . Years of education: 19   Occupational History  . Disability  Social History Main Topics  . Smoking status: Never Smoker  . Smokeless tobacco: Never Used     Comment: pack per month of cigars = 5  . Alcohol use No  . Drug use:     Frequency: 5.0 times per week    Types: Marijuana     Comment: 04/17/15- 2 weeks ago  . Sexual activity: No   Other Topics Concern  . Not on file   Social History Narrative   Born and raised in [place], [place]. Currently resides in a house by herself. No pets. Fun: Read, shoot pool.    Denies religious beliefs that would effect health care.    Lives at home alone.   Right-handed.   No caffeine use.   Denies abuse and feels safe at home.     FAMILY HISTORY: Family History  Problem Relation Age of Onset  . Brain cancer Sister 64  . Lung cancer Brother 35  . Heart attack Mother   . Heart attack Father     34s  . Heart attack Maternal Uncle   . Diabetes Maternal Uncle   . Ovarian cancer Paternal Aunt     3  paternal aunts with ovarian cancer  . Heart attack Brother   . Diabetes Paternal Uncle     ALLERGIES:  is allergic to other; demerol [meperidine]; morphine and related; oxycodone; penicillins; tylenol [acetaminophen]; and ibuprofen.  MEDICATIONS:  Current Outpatient Prescriptions  Medication Sig Dispense Refill  . amLODipine (NORVASC) 10 MG tablet Take 1 tablet (10 mg total) by mouth daily. 30 tablet 1  . dicyclomine (BENTYL) 10 MG capsule Take 1 capsule (10 mg total) by mouth 4 (four) times daily -  before meals and at bedtime. 60 capsule 0  . docusate sodium 100 MG CAPS Take 100 mg by mouth 2 (two) times daily as needed for mild constipation. 10 capsule 0  . LYRICA 200 MG capsule tablet 1 capsule every 8 hours AS DIRECTED BY PRESCRIBER 90 capsule 0  . ondansetron (ZOFRAN ODT) 4 MG disintegrating tablet Take 1 tablet (4 mg total) by mouth every 8 (eight) hours as needed for nausea or vomiting. 20 tablet 0   No current facility-administered medications for this visit.     REVIEW OF SYSTEMS:   Constitutional: Denies fevers, chills or abnormal night sweats. (+) fatigue Eyes: Denies blurriness of vision, double vision or watery eyes Ears, nose, mouth, throat, and face: Denies mucositis or sore throat Respiratory: Denies cough, (+) dyspnea on exertion, no wheezes Cardiovascular: Denies palpitation, chest discomfort or lower extremity swelling Gastrointestinal:no nausea or abdominal pain, no heartburn or change in bowel habits Skin: Denies abnormal skin rashes Lymphatics: Denies new lymphadenopathy or easy bruising Neurological: (+) Peripheral neuropathy. Behavioral/Psych: Mood is stable, no new changes  All other systems were reviewed with the patient and are negative.  PHYSICAL EXAMINATION: ECOG PERFORMANCE STATUS: 1  Vitals:   11/06/16 1458  BP: (!) 165/100  Pulse: 82  Resp: 17  Temp: 97.8 F (36.6 C)   Filed Weights   11/06/16 1458  Weight: 187 lb 1.6 oz (84.9 kg)     GENERAL:alert, slightly distressed due to her pain SKIN: skin color, texture, turgor are normal, no rashes or significant lesions EYES: normal, conjunctiva are pink and non-injected, sclera clear OROPHARYNX:no exudate, no erythema and lips, buccal mucosa, and tongue normal  NECK: supple, thyroid normal size, non-tender, without nodularity LYMPH:  no palpable lymphadenopathy in the cervical, axillary or inguinal LUNGS: clear to auscultation and  percussion with normal breathing effort HEART: regular rate & rhythm and no murmurs and no lower extremity edema ABDOMEN:abdomen soft, (+) tenderness at middle and left lower abdomen with gentle palpitation, no rebound pain, normal bowel sounds, no organmegaly. Musculoskeletal:no cyanosis of digits and no clubbing PSYCH: alert & oriented x 3 with fluent speech NEURO: no focal neuro deficits. EXT: No edema noted of the lower extremities. (+) Peripheral neuropathy. BREAST: Patient declined.  LABORATORY DATA:  I have reviewed the data as listed CBC Latest Ref Rng & Units 11/06/2016 08/18/2016 07/10/2016  WBC 3.9 - 10.3 10e3/uL 5.8 6.9 5.5  Hemoglobin 11.6 - 15.9 g/dL 15.0 14.9 14.2  Hematocrit 34.8 - 46.6 % 41.8 40.9 41.5  Platelets 145 - 400 10e3/uL 159 171 178   CMP Latest Ref Rng & Units 11/06/2016 08/18/2016 07/10/2016  Glucose 70 - 140 mg/dl 88 109(H) 92  BUN 7.0 - 26.0 mg/dL 8.6 8 13.2  Creatinine 0.6 - 1.1 mg/dL 0.9 0.75 0.9  Sodium 136 - 145 mEq/L 142 140 141  Potassium 3.5 - 5.1 mEq/L 3.9 3.7 3.7  Chloride 101 - 111 mmol/L - 107 -  CO2 22 - 29 mEq/L 25 22 27   Calcium 8.4 - 10.4 mg/dL 9.5 9.5 9.4  Total Protein 6.4 - 8.3 g/dL 7.9 8.3(H) 7.8  Total Bilirubin 0.20 - 1.20 mg/dL 0.72 0.8 0.55  Alkaline Phos 40 - 150 U/L 98 83 95  AST 5 - 34 U/L 19 22 20   ALT 0 - 55 U/L 14 14 11    CEA (IN HOUSE-[facility])  Order: [id number]  Status:  Final result Visible to patient:  No (Not Released) Next appt:  11/26/2016 at 02:30 PM in Gynecologic  Oncology (GEHRIG,PAOLA A., MD)   Ref Range & Units 13:55  CEA ([facility]-In House) 0.00 - 5.00 ng/mL 1.60        .  PATHOLOGY REPORT 09/18/2014 Colon, segmental resection for tumor, terminal ileum and right - ULCERATED AND INVASIVE ADENOCARCINOMA SEE COMMENT. - NEGATIVE FOR LYMPH VASCULAR INVASION - NEGATIVE FOR PERINEURAL INVASION. 1 of 4 FINAL for Stephani, Beyonca [phone]) Diagnosis(continued) - TUMOR EXTENDS INTO PERICOLONIC SOFT TISSUE AND IS LESS THAN 1 MM FROM THE SEROSAL SURFACE. - TWENTY-THREE LYMPH NODES, NEGATIVE FOR TUMOR (0/23) - ONE SOFT TISSUE TUMOR DEPOSIT. - SURGICAL MARGINS, NEGATIVE FOR TUMOR. Microscopic Comment COLON AND RECTUM (INCLUDING TRANS-ANAL RESECTION): Specimen: Terminal ileum and right colon. Procedure: Right colectomy. Tumor site: Cecum. Specimen integrity: Intact. Macroscopic intactness of mesorectum: N/A Macroscopic tumor perforation: Absent. Invasive tumor: Maximum size: 4.7 cm Histologic type(s): Adenocarcinoma Histologic grade and differentiation: G2: moderately differentiated/low grade Type of polyp in which invasive carcinoma arose: Tubular adenoma with high grade dysplasia Microscopic extension of invasive tumor: Tumor extends into pericolonic soft tissue, see comment. Lymph-Vascular invasion: Absent. Peri-neural invasion: Absent. Tumor deposit(s) (discontinuous extramural extension): (x1) Resection margins: Proximal margin: Negative. Distal margin: Negative. Circumferential (radial) (posterior ascending, posterior descending; lateral and posterior mid-rectum; and entire lower 1/3 rectum): Negative Mesenteric margin (sigmoid and transverse): N/A Distance closest margin (if all above margins negative): 3.3 cm (distal) Trans-anal resection margins only: Deep margin: N/A Mucosal Margin: N/A Distance closest mucosal margin (if negative): N/A Treatment effect (neo-adjuvant therapy): None. Additional polyp(s): None. Non-neoplastic  findings: None Lymph nodes: number examined- 23; number positive: 0 Pathologic Staging: pT3, pN1c, pMX, see comment. Ancillary studies: Per the [place] gastrointestinal working group guidelines, tumor will be submitted for both mismatch repair protein expression by immunohistochemistry and microsatellite instability testing by PCR. The results will  be reported in an add on. Comment: Although tumor is not directly involving the serosa (visceral peritoneum), tumor is less than 1 mm from the serosal surface and is associated with fibroinflammatory inflammation involving the serosa. Some studies consider this finding to represent serosal involvement.  Tumor MSI: STABLE   Ovary, left and cystic mass 11/01/2014 - MIXED ENDOMETRIOID AND CLEAR CELL CARCINOMA, FIGO GRADE III/III, OVARIAN PRIMARY. - FOCAL FALLOPIAN TUBE CARCINOMA IN SITU. - SEE ONCOLOGY TABLE BELOW.  Specimen(s): Left ovary and left fallopian tube. Procedure: (including lymph node sampling) Salpingo-oophorectomy. Primary tumor site (including laterality): Left ovary. Ovarian surface involvement: Present. Ovarian capsule intact without fragmentation: Disrupted. Maximum tumor size (cm): At least 3.7 cm. Histologic type: Mixed endometrioid and clear cell adenocarcinoma. Grade: III Peritoneal implants: (specify invasive or non-invasive): Not identified. Pelvic extension (list additional structures on separate lines and if involved): Not identified. Lymph nodes: number examined 0 ; number positive N/A TNM code: pT1c, pNX FIGO Stage (based on pathologic findings, needs clinical correlation): IC, at least. Comments: The carcinoma present in the current specimen is morphologically dissimilar from the tumor in the patient's previous colectomy, [phone]. The tumor cells in the current case are positive for cytokeratin 7 and estrogen receptor. There is faint staining for p53. Cytokeratin 20 and CDX2 are negative. Overall, the  features are consistent with primary ovarian adenocarcinoma with mixed endometrioid and clear cell features. There is a small focus of carcinoma in situ present in fallopian tube epithelium in random tissue. Dr Claudette Laws has reviewed the case and concurs with the interpretation. The case was discussed with Dr Hulen Skains on 11/06/2014.  RADIOGRAPHIC STUDIES: I have personally reviewed the radiological images as listed and agreed with the findings in the report.  CT chest/abd/pelvis with contrast 08/06/2016 IMPRESSION: 1. No recurrent malignancy identified. 2. 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 On the prior exam from 09/13/2015 there was back flow of contrast in the left external iliac artery to supply the left internal iliac artery ; this back flow of contrast is no longer present in the left external iliac artery is occluded. 3. Several pulmonary nodules at or below 4 mm diameter, unchanged. 4. 3 mm hypodensity posteriorly in the body of the pancreas, not well seen previously and subtle today, likely incidental, but merits observation. 5. 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 Prominent degenerative spurring of both acetabula. 6. Lumbar spondylosis and degenerative disc disease causing prominent impingement at L4-5 and mild impingement at L5-S1.  Ct chest, Abdomen Pelvis W Contrast 09/13/2015  IMPRESSION: 1. No evidence of metastatic disease in the chest, abdomen or pelvis. 2. Stable postsurgical changes status post right hemicolectomy and TAH-BSO, with no evidence of local tumor recurrence. 3. Patent femoral-femoral arterial bypass graft.   ASSESSMENT & PLAN:  55 y.o.  female with past medical history of arthritis, hysterectomy, history of abdominal bleeding status post embolization in 2005, who presented with abdominal pain nausea vomiting and weight loss. CT of abdomen showed a  3.8cm cecal mass with associated ileocecal intussusception, and a 9.1cm solid and cystic mass in the left pelvis,which was found to be a ovarian cancer. She is status post right hemicolectomy and terminal ileoectomy on 09/18/2014, and left Salpingo-oophorectomy on 11/01/14.   1. Right colon cancer, PT3pN1M0, stage III, MSI-stable  -I previously reviewed her CT scan and the surgical pathology findings with her in details. Based on her surgical pathology, she has  stage III disease.  -Her tumor microsatellite stability test showed stable, she unlikely has Lynch syndrome. -Althrough I recommended adjuvant chemotherapy, which is standard care for stage III colon cancer, her chemotherapy was postponed due to her multiple surgery, significance of pain after surgeries, and compliance issue (multiple no show), she was 5 months out of initial colon surgery when she returned for follow up after surgery, and the benefit of adjuvant chemotherapy was minimal at that point. So she did not receive adjuvant chemotherapy -we discussed survelliance plan, for a total of 5 years. Given the high risk of cancer recurrence, I plan to see her every 3-4 months for the first 2 years, with a repeated CT scan every 6 months for the first 2-3 years, then yearly  -she is clinically doing very well, physical exam was unremarkable, no clinical concern for recurrence. -We previously discussed the surveillance CT scan from 08/06/16. No recurrent malignancy identified. -Surveillance CT scan in Spring/Summer 2018.  2. Ovary cancer, left, pT1 cNX M0, at least stage I, mixed endometrioid and clear cell histology, grade 3, ER positive. - She underwent right right oophorectomy and pelvic lymph nodes biopsy for staging, which wall over negative. -She is likely need adjuvant chemotherapy, the standard therapy is carboplatin and paclitaxel for overan cancer. She is close to 3 months out of her initial ovary surgery, and she developed left lower  extremity neuropathy after her recent surgery, not a great candidate for chemotherapy. I discussed with Dr. Denman George about her adjuvant adjuvant chemotherapy. Dr. Denman George felt her colon cancer is at much high risk for recurrence than ovarian cancer, and she is also quite far our from her initial ovaran cancer surgery, the benefit of chemotherapy for ovarian cancer is very limited. Due to her limited PS and pain issue, she is also not a good candidate for chemo.  -We'll follow up CA125,  Which has been normal lately   3. BRCA1 mutation carrier -I previously discussed her recent genetic testing results which revealed positive BRCA1 mutation. -I previously discussed extensively about the risk of breast cancer (57-65% by age of 61), and I recommend prophylactic bilateral mastectomy, but she declined. She agrees to have annual screening mammogram -She is due for screening mammogram and I have set one up for her. -Due to her high-risk of breast cancer, I recommend annual screening breast MRI, she agrees. -She has two daughters, the patient states they have been tested and are negative. -We discussed chemoprevention Tamoxifen to reduce the risk of breast cancer in the future. We discussed the risks and benefits. The patient has declined. -The patient has declined prophylactic bilateral mastectomies.   4. abdominal and left lower extremity neuropathy   -Likely related to her peripheral vascular disease and a neuropathy of the surgery. -Much improved lately, she is on Lyrica, but does not feel it helps.  4. PVD -she willl follow up with Dr. Eden Lathe   5. Coping and social support, history of depression and PTSD -She has had some difficulty coping with her synchronized colon and ovarian cancer. She lives alone, does not want her children to be involved much in her cancer care.  -her mood has much improved over time, she is coping much better lately.  6. HTN -The patient is being followed by her PCP, but the  patient stopped about 2 days ago. -I advised the patient to speak with her PCP regarding this and the importance of bringing her blood pressure down.  Plan -Labs and surveillance CT chest,  abdomen and pelvis with contrast in April 2018. To occur 1 week prior to follow up. -RTC in 4 months for follow up. -Screening mammogram in Jan 2018, and screening b/l breast MRI in March   All questions were answered. The patient knows to call the clinic with any problems, questions or concerns. I spent 20 minutes counseling the patient face to face. The total time spent in the appointment was 30 minutes and more than 50% was on counseling.     Truitt Merle, MD 11/06/2016   This document serves as a record of services personally performed by Truitt Merle, MD. It was created on her behalf by Darcus Austin, a trained medical scribe. The creation of this record is based on the scribe's personal observations and the provider's statements to them. This document has been checked and approved by the attending provider.

## 2016-11-06 ENCOUNTER — Telehealth: Payer: Self-pay | Admitting: Hematology

## 2016-11-06 ENCOUNTER — Other Ambulatory Visit (HOSPITAL_BASED_OUTPATIENT_CLINIC_OR_DEPARTMENT_OTHER): Payer: Medicare Other

## 2016-11-06 ENCOUNTER — Ambulatory Visit (HOSPITAL_BASED_OUTPATIENT_CLINIC_OR_DEPARTMENT_OTHER): Payer: Medicare Other | Admitting: Hematology

## 2016-11-06 ENCOUNTER — Encounter: Payer: Self-pay | Admitting: Hematology

## 2016-11-06 VITALS — BP 165/100 | HR 82 | Temp 97.8°F | Resp 17 | Ht 67.0 in | Wt 187.1 lb

## 2016-11-06 DIAGNOSIS — C182 Malignant neoplasm of ascending colon: Secondary | ICD-10-CM

## 2016-11-06 DIAGNOSIS — C562 Malignant neoplasm of left ovary: Secondary | ICD-10-CM

## 2016-11-06 DIAGNOSIS — I739 Peripheral vascular disease, unspecified: Secondary | ICD-10-CM | POA: Diagnosis not present

## 2016-11-06 DIAGNOSIS — Z170 Estrogen receptor positive status [ER+]: Secondary | ICD-10-CM | POA: Diagnosis not present

## 2016-11-06 DIAGNOSIS — G629 Polyneuropathy, unspecified: Secondary | ICD-10-CM

## 2016-11-06 DIAGNOSIS — Z1501 Genetic susceptibility to malignant neoplasm of breast: Secondary | ICD-10-CM

## 2016-11-06 DIAGNOSIS — Z1509 Genetic susceptibility to other malignant neoplasm: Secondary | ICD-10-CM

## 2016-11-06 LAB — CBC WITH DIFFERENTIAL/PLATELET
BASO%: 0.2 % (ref 0.0–2.0)
Basophils Absolute: 0 10*3/uL (ref 0.0–0.1)
EOS ABS: 0.1 10*3/uL (ref 0.0–0.5)
EOS%: 0.9 % (ref 0.0–7.0)
HCT: 41.8 % (ref 34.8–46.6)
HEMOGLOBIN: 15 g/dL (ref 11.6–15.9)
LYMPH#: 2.8 10*3/uL (ref 0.9–3.3)
LYMPH%: 47.6 % (ref 14.0–49.7)
MCH: 35.3 pg — ABNORMAL HIGH (ref 25.1–34.0)
MCHC: 35.9 g/dL (ref 31.5–36.0)
MCV: 98.4 fL (ref 79.5–101.0)
MONO#: 0.4 10*3/uL (ref 0.1–0.9)
MONO%: 6.5 % (ref 0.0–14.0)
NEUT%: 44.8 % (ref 38.4–76.8)
NEUTROS ABS: 2.6 10*3/uL (ref 1.5–6.5)
PLATELETS: 159 10*3/uL (ref 145–400)
RBC: 4.25 10*6/uL (ref 3.70–5.45)
RDW: 13.5 % (ref 11.2–14.5)
WBC: 5.8 10*3/uL (ref 3.9–10.3)

## 2016-11-06 LAB — COMPREHENSIVE METABOLIC PANEL
ALBUMIN: 3.8 g/dL (ref 3.5–5.0)
ALK PHOS: 98 U/L (ref 40–150)
ALT: 14 U/L (ref 0–55)
ANION GAP: 10 meq/L (ref 3–11)
AST: 19 U/L (ref 5–34)
BILIRUBIN TOTAL: 0.72 mg/dL (ref 0.20–1.20)
BUN: 8.6 mg/dL (ref 7.0–26.0)
CO2: 25 meq/L (ref 22–29)
CREATININE: 0.9 mg/dL (ref 0.6–1.1)
Calcium: 9.5 mg/dL (ref 8.4–10.4)
Chloride: 107 mEq/L (ref 98–109)
EGFR: 86 mL/min/{1.73_m2} — AB (ref >90)
GLUCOSE: 88 mg/dL (ref 70–140)
Potassium: 3.9 mEq/L (ref 3.5–5.1)
Sodium: 142 mEq/L (ref 136–145)
TOTAL PROTEIN: 7.9 g/dL (ref 6.4–8.3)

## 2016-11-06 LAB — CEA (IN HOUSE-CHCC): CEA (CHCC-In House): 1.6 ng/mL (ref 0.00–5.00)

## 2016-11-06 NOTE — Telephone Encounter (Signed)
Appointments scheduled per 12/14 LOS. Patient given AVS report and calendars with future scheduled appointments. CT scan orders were not placed at time of scheduling.

## 2016-11-07 LAB — CA 125: CANCER ANTIGEN (CA) 125: 6.7 U/mL (ref 0.0–38.1)

## 2016-11-07 LAB — CEA: CEA1: 2.1 ng/mL (ref 0.0–4.7)

## 2016-11-19 ENCOUNTER — Telehealth: Payer: Self-pay | Admitting: *Deleted

## 2016-11-19 NOTE — Telephone Encounter (Signed)
Patient called [facility], left message stating she didn't know why we were calling her for an appointment. RN returned the call, left message that she had been referred to [place] for treatment/cure of her Hepatitis C. Asked her to call back if she was interested in an appointment. Landis Gandy, RN

## 2016-11-26 ENCOUNTER — Encounter: Payer: Self-pay | Admitting: Gynecologic Oncology

## 2016-11-26 ENCOUNTER — Ambulatory Visit: Payer: Medicare Other | Attending: Gynecologic Oncology | Admitting: Gynecologic Oncology

## 2016-11-26 VITALS — BP 164/93 | HR 79 | Temp 98.4°F | Resp 16 | Ht 67.0 in | Wt 182.3 lb

## 2016-11-26 DIAGNOSIS — Z8543 Personal history of malignant neoplasm of ovary: Secondary | ICD-10-CM | POA: Diagnosis not present

## 2016-11-26 DIAGNOSIS — R918 Other nonspecific abnormal finding of lung field: Secondary | ICD-10-CM | POA: Diagnosis not present

## 2016-11-26 DIAGNOSIS — Z85038 Personal history of other malignant neoplasm of large intestine: Secondary | ICD-10-CM | POA: Diagnosis not present

## 2016-11-26 DIAGNOSIS — R05 Cough: Secondary | ICD-10-CM | POA: Insufficient documentation

## 2016-11-26 DIAGNOSIS — M5137 Other intervertebral disc degeneration, lumbosacral region: Secondary | ICD-10-CM | POA: Insufficient documentation

## 2016-11-26 DIAGNOSIS — C189 Malignant neoplasm of colon, unspecified: Secondary | ICD-10-CM | POA: Diagnosis not present

## 2016-11-26 DIAGNOSIS — M87852 Other osteonecrosis, left femur: Secondary | ICD-10-CM | POA: Insufficient documentation

## 2016-11-26 DIAGNOSIS — C561 Malignant neoplasm of right ovary: Secondary | ICD-10-CM | POA: Insufficient documentation

## 2016-11-26 DIAGNOSIS — Z1501 Genetic susceptibility to malignant neoplasm of breast: Secondary | ICD-10-CM | POA: Diagnosis not present

## 2016-11-26 DIAGNOSIS — C569 Malignant neoplasm of unspecified ovary: Secondary | ICD-10-CM

## 2016-11-26 NOTE — Patient Instructions (Signed)
It was very nice meeting you today. We need to see you back in 6 months. We will arrange for your mammogram as well as her MRI at [facility]. As we discussed you need to have one of these done every year alternating every 6 months as part of your screening. Alternatively bilateral mastectomy would be an option as well. We will notify you of the results of your scans. Please make sure to follow-up with Dr. Burr Medico as scheduled.

## 2016-11-26 NOTE — Progress Notes (Signed)
Gyn Oncology Return Patient Visit  HPI:  Catherine Rogers is a 56 y.o. 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  She then underwent a expiratory laparotomy right salpingo-oophorectomy, omentectomy, pelvic and para-aortic lymph node dissection, biopsies on 12/26/14 with Dr. Denman George  Her final pathology revealed no cancer residual in the remaining specimens confirming a stage I ovarian cancer. She had a complicated history with both a T3 colon cancer and a stage IC ovarian cancer. 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   She did not receive any adjuvant chemotherapy for her ovarian cancer and is under the care of Dr. Burr Medico. She had imaging in September 2017 that revealed: IMPRESSION: 1. No recurrent malignancy identified. 2. 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 On the prior exam from 09/13/2015 there was back flow of contrast in the left external iliac artery to supply the left internal iliac artery ; this back flow of contrast is no longer present in the left externaliliac artery is occluded. 3. Several pulmonary nodules at or below 4 mm diameter, unchanged. 4. 3 mm hypodensity posteriorly in the body of the pancreas, not well seen previously and subtle today, likely incidental, but merits observation. 5. 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 Prominent degenerative spurring of both acetabula. 6. Lumbar spondylosis and degenerative disc disease causing prominent impingement at L4-5 and mild impingement at L5-S1.  She had tumor markers in December 2014 that showed a CA-125 of 6.7 and a CEA of 2.1.  Interval History:  She has not had a mammogram since September 2016 though she thought she had had 1 last year she had. She is well aware of her BRCA status. She states that her for bruising her 2  daughters well tested and that they are all negative. She states that her grandchildren have not yet been tested. I explained to her that if her children are negative for grandchildren by default need to be as a cannot of inherited the mutation from anybody if there are parents are negative. She states her no new medical problems and her family. She complains of a 6 month history of lower abdominal pain never wakes her up at night. She states she'll have the pain every day she lays in a recliner. If she does not lay in a recliner she will not have the pain. She states that the pain never wakes her up at night. It can last available in a time and again it depends somewhat chair she's sitting in. She denies any nausea or vomiting. She does have a cough that she's recently getting over a cold. It is productive of white sputum. She denies any headaches or visual changes. She denies any nausea vomiting. Review of systems as below.  Review of systems: Constitutional:  She has no weight gain or weight loss. She has no fever or chills. Eyes: No vision changes, wears reading glasses. Ears, Nose, Mouth, Throat: No dizziness, headaches or changes in hearing. No mouth sores. Cardiovascular: No chest pain, palpitations or edema. Respiratory:  +cough Gastrointestinal: She has normal bowel movements without diarrhea or constipation. She denies any nausea or vomiting. Abdominal pain as above. Genitourinary:  She has no irregular vaginal bleeding or vaginal discharge Psychiatric:  She denies depression or anxiety.  Physical Exam: Blood pressure (!) 164/93, pulse 79, temperature 98.4 F (36.9 C),  temperature source Oral, resp. rate 16, height _0  (1.702 m), weight 182 lb 4.8 oz (82.7 kg), SpO2 100 %. General: Well dressed, well nourished in no apparent distress.    HEENT:  Normocephalic and atraumatic, no lesions. Thyroid is normal size, not nodular, midline.  Lungs:  Clear to auscultation bilaterally.  No  wheezes.  Cardiovascular:  Regular rate and rhythm.    Abdomen:  Soft, nontender, nondistended.  No palpable masses.  No hepatosplenomegaly.  No ascites. Normal bowel sounds.  No hernias.  Incision is well-healed. Tenderness at the umbilicus.  Genitourinary: External genitalia within normal limits. Bimanual examination reveals no masses or nodularity. Patient refused to allow a rectal exam.   Extremities: No edema.   Assessment:    56 y.o. 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   S/p ex lap, RSO, lymphadenectomy, omentectomy on 12/26/14. She did not receive any adjuvant therapy for either of her malignancies. Of note, she was recently tested in April 2017 and was found to be BRCA1 positive.  Plan: Her exam is unremarkable today. Should recent imaging in September. Etc. me to really discern how she would have abdominal pain only when she lays in a recliner. She otherwise does not have any and she has negative imaging as well as a negative exam today. She will return to see [place] in 6 months.  She will follow-up with Dr. Burr Medico as scheduled for follow-up of her colon cancer.  She has not had a mammogram since September 2016. We scheduled one for her Forestine Na for 12/10/16 and she is aware of the appointment. We discussed the role of alternating imaging with MRI and mammograms. She will need an MRI of the breast after her mammogram. She'll return to see [place] in 6 months and at that time we can ensure that she has her MRI scheduled.  She will follow-up with her other physicians as scheduled.   [name] A., MD

## 2016-12-02 ENCOUNTER — Other Ambulatory Visit: Payer: Self-pay | Admitting: Family

## 2016-12-03 NOTE — Telephone Encounter (Signed)
Faxed

## 2016-12-10 ENCOUNTER — Ambulatory Visit (HOSPITAL_COMMUNITY): Payer: Medicare Other

## 2016-12-24 ENCOUNTER — Ambulatory Visit (HOSPITAL_COMMUNITY)
Admission: RE | Admit: 2016-12-24 | Discharge: 2016-12-24 | Disposition: A | Discharge status: Home / Self Care | Payer: Medicare Other | Source: Ambulatory Visit | Attending: Hematology | Admitting: Hematology

## 2016-12-24 DIAGNOSIS — Z1501 Genetic susceptibility to malignant neoplasm of breast: Secondary | ICD-10-CM

## 2016-12-24 DIAGNOSIS — Z1509 Genetic susceptibility to other malignant neoplasm: Secondary | ICD-10-CM

## 2016-12-24 DIAGNOSIS — Z1231 Encounter for screening mammogram for malignant neoplasm of breast: Secondary | ICD-10-CM | POA: Insufficient documentation

## 2016-12-30 ENCOUNTER — Encounter: Payer: Self-pay | Admitting: Cardiology

## 2016-12-31 ENCOUNTER — Encounter: Payer: Self-pay | Admitting: Internal Medicine

## 2016-12-31 ENCOUNTER — Ambulatory Visit (INDEPENDENT_AMBULATORY_CARE_PROVIDER_SITE_OTHER): Payer: Medicare Other | Admitting: Internal Medicine

## 2016-12-31 DIAGNOSIS — B182 Chronic viral hepatitis C: Secondary | ICD-10-CM | POA: Diagnosis not present

## 2016-12-31 NOTE — Progress Notes (Signed)
[facility] for Infectious Disease   CC: consideration for treatment for chronic hepatitis C  HPI:  +Catherine Rogers is a 56 y.o. female who presents for initial evaluation and management of chronic hepatitis C.  Patient tested positive in October 2017. Hepatitis C-associated risk factors present are: history of blood transfusion (details: prior to 1990). Patient denies intranasal drug use, IV drug abuse, multiple sexual partners, sexual contact with person with liver disease. Patient has had other studies performed. Results: hepatitis C RNA by PCR, result: positive. Patient has not had prior treatment for Hepatitis C. Patient does not have a past history of liver disease. Patient does not have a family history of liver disease. Patient does not  have associated signs or symptoms related to liver disease.  Labs reviewed and confirm chronic hepatitis C with a positive viral load.   Records reviewed from PCP,  Screened routinely.  Also with ovarian cancer.       Patient does not have documented immunity to Hepatitis A. Patient does not have documented immunity to Hepatitis B.    Review of Systems:  Constitutional: negative for malaise and anorexia Gastrointestinal: negative for diarrhea Integument/breast: negative for rash Musculoskeletal: negative for myalgias and arthralgias All other systems reviewed and are negative       Past Medical History:  Diagnosis Date  . Anemia   . Anxiety   . Arthritis   . Asthma   . Cancer ([place])    colon and ovarian  . Complication of anesthesia    woke and saw long tube coming out of mouth . sat up then went back to sleep  . Depression   . Ectopic pregnancy   . Family history of adverse reaction to anesthesia    sister on dialysis- hypotension   . Family history of ovarian cancer   . GERD (gastroesophageal reflux disease)    occasional  . Head injury, closed, with brief LOC ([place])    had a cut- as a child  . Headache   . History of blood  transfusion    2005 approximately   . Hypertension   . PAD (peripheral artery disease) ([place]) 04/18/2015  . Pelvic cyst   . PONV (postoperative nausea and vomiting)   . PTSD (post-traumatic stress disorder)     Prior to Admission medications   Medication Sig Start Date End Date Taking? Authorizing Provider  docusate sodium 100 MG CAPS Take 100 mg by mouth 2 (two) times daily as needed for mild constipation. 09/22/14  Yes Nat Christen, PA-C  LYRICA 200 MG capsule tablet 1 capsule every 8 hours AS DIRECTED BY PRESCRIBER 12/03/16  Yes Golden Circle, FNP  amLODipine (NORVASC) 10 MG tablet Take 1 tablet (10 mg total) by mouth daily. Patient not taking: Reported on 11/26/2016 09/04/16   Golden Circle, FNP  dicyclomine (BENTYL) 10 MG capsule Take 1 capsule (10 mg total) by mouth 4 (four) times daily -  before meals and at bedtime. Patient not taking: Reported on 11/26/2016 09/04/16   Golden Circle, FNP  ondansetron (ZOFRAN ODT) 4 MG disintegrating tablet Take 1 tablet (4 mg total) by mouth every 8 (eight) hours as needed for nausea or vomiting. Patient not taking: Reported on 11/26/2016 09/05/16   Golden Circle, FNP    Allergies  Allergen Reactions  . Other Hives and Itching    Highly acidic foods "make me itch and break out" oranges, tomatoes  . Demerol [Meperidine] Nausea And Vomiting  .  Morphine And Related Nausea And Vomiting  . Oxycodone Itching  . Penicillins Other (See Comments)    Childhood allergy  . Tylenol [Acetaminophen] Nausea And Vomiting  . Ibuprofen Nausea And Vomiting    Social History  Substance Use Topics  . Smoking status: Never Smoker  . Smokeless tobacco: Never Used     Comment: pack per month of cigars = 5  . Alcohol use Not on file    Family History  Problem Relation Age of Onset  . Brain cancer Sister 57  . Lung cancer Brother 70  . Heart attack Mother   . Heart attack Father     20s  . Heart attack Maternal Uncle   . Diabetes Maternal Uncle     . Ovarian cancer Paternal Aunt     3 paternal aunts with ovarian cancer  . Heart attack Brother   . Diabetes Paternal Uncle   no cirrhosis or liver cancer   Objective:  Constitutional: in no apparent distress,  Vitals:   12/31/16 0908  BP: (!) 149/89  Pulse: 79  Temp: 98.6 F (37 C)   Eyes: anicteric Cardiovascular: Cor RRR and No murmurs Respiratory: CTA B; normal respiratory effort Gastrointestinal: Bowel sounds are normal, liver is not enlarged, spleen is not enlarged Musculoskeletal: no pedal edema noted Skin: negatives: no rash; no porphyria cutanea tarda Lymphatic: no cervical lymphadenopathy   Laboratory Genotype: No results found for: HCVGENOTYPE HCV viral load:  Lab Results  Component Value Date   HCVQUANT 40,981,191 (H) 09/04/2016   Lab Results  Component Value Date   WBC 5.8 11/06/2016   HGB 15.0 11/06/2016   HCT 41.8 11/06/2016   MCV 98.4 11/06/2016   PLT 159 11/06/2016    Lab Results  Component Value Date   CREATININE 0.9 11/06/2016   BUN 8.6 11/06/2016   NA 142 11/06/2016   K 3.9 11/06/2016   CL 107 08/18/2016   CO2 25 11/06/2016    Lab Results  Component Value Date   ALT 14 11/06/2016   AST 19 11/06/2016   ALKPHOS 98 11/06/2016     Labs and history reviewed and show CHILD-PUGH A  5-6 points: Child class A 7-9 points: Child class B 10-15 points: Child class C  Lab Results  Component Value Date   INR 1.07 04/18/2015   BILITOT 0.72 11/06/2016   ALBUMIN 3.8 11/06/2016     Assessment: New Patient with Chronic Hepatitis C genotype unknown, untreated.  I discussed with the patient the lab findings that confirm chronic hepatitis C as well as the natural history and progression of disease including about 30% of people who develop cirrhosis of the liver if left untreated and once cirrhosis is established there is a 2-7% risk per year of liver cancer and liver failure.  I discussed the importance of treatment and benefits in reducing the  risk, even if significant liver fibrosis exists.   Plan: 1) Patient counseled extensively on limiting acetaminophen to no more than 2 grams daily, avoidance of alcohol. 2) Transmission discussed with patient including sexual transmission, sharing razors and toothbrush.   3) Will need referral to gastroenterology if concern for cirrhosis 4) Will need referral for substance abuse counseling: No.; Further work up to include urine drug screen  No. 5) Will prescribe appropriate medication based on genotype and coverage  6) Hepatitis A and B titers 7) Pneumovax vaccine next visit 9) Further work up to include liver staging with elastography 10) will follow up after starting medication

## 2016-12-31 NOTE — Patient Instructions (Signed)
Date 12/31/16  Dear Ms Catherine Rogers, As discussed in the [facility], your hepatitis C therapy will include highly effective medication(s) for treatment and will vary based on the type of hepatitis C and insurance approval.  Potential medications include:          Harvoni (sofosbuvir '90mg'$ /ledipasvir '400mg'$ ) tablet oral daily          OR     Epclusa (sofosbuvir '400mg'$ /velpatasvir '100mg'$ ) tablet oral daily          OR      Mavyret (glecaprevir 100 mg/pibrentasvir 40 mg): Take 3 tablets oral daily          OR     Zepatier (elbasvir 50 mg/grazoprevir 100 mg) oral daily, +/- ribavirin              Medications are typically for 8 or 12 weeks total ---------------------------------------------------------------- Your HCV Treatment Start Date: You will be notified by our office once the medication is approved and where you can pick it up (or if mailed)   ---------------------------------------------------------------- [place]:   [address] [facility] [place], [place] [address] Phone: [phone] Hours: Monday to Friday 7:30 am to 6:00 pm   Please always contact your pharmacy at least 3-4 business days before you run out of medications to ensure your next month's medication is ready or 1 week prior to running out if you receive it by mail.  Remember, each prescription is for 28 days. ---------------------------------------------------------------- GENERAL NOTES REGARDING YOUR HEPATITIS C MEDICATION:  Some medications have the following interactions:  - Acid reducing agents such as H2 blockers (ie. Pepcid (famotidine), Zantac (ranitidine), Tagamet (cimetidine), Axid (nizatidine) and proton pump inhibitors (ie. Prilosec (omeprazole), Protonix (pantoprazole), Nexium (esomeprazole), or Aciphex (rabeprazole)). Do not take until you have discussed with a health care provider.    -Antacids that contain magnesium and/or aluminum hydroxide (ie. Milk of Magensia, Rolaids,  Gaviscon, Maalox, Mylanta, an dArthritis Pain Formula).  -Calcium carbonate (calcium supplements or antacids such as Tums, Caltrate, Os-Cal).  -St. John's wort or any products that contain St. John's wort like some herbal supplements  Please inform the office prior to starting any of these medications.  - The common side effects associated with Harvoni include:      1. Fatigue      2. Headache      3. Nausea      4. Diarrhea      5. Insomnia  Please note that this only lists the most common side effects and is NOT a comprehensive list of the potential side effects of these medications. For more information, please review the drug information sheets that come with your medication package from the pharmacy.  ---------------------------------------------------------------- GENERAL HELPFUL HINTS ON HCV THERAPY: 1. Stay well-hydrated. 2. Notify the [facility] of any changes in your other over-the-counter/herbal or prescription medications. 3. If you miss a dose of your medication, take the missed dose as soon as you remember. Return to your regular time/dose schedule the next day.  4.  Do not stop taking your medications without first talking with your healthcare provider. 5.  You may take Tylenol (acetaminophen), as long as the dose is less than 2000 mg (OR no more than 4 tablets of the Tylenol Extra Strengths '500mg'$  tablet) in 24 hours. 6.  You will see our pharmacist-specialist within the first 2 weeks of starting your medication to monitor for any possible side effects. 7.  You will have labs once during treatment, soon  after treatment completion and one final lab 6 months after treatment completion to verify the virus is out of your system.  Catherine Rogers, [place] for [place] [place], [place]  [id number] [phone]

## 2017-01-01 LAB — HEPATITIS B SURFACE ANTIGEN: Hepatitis B Surface Ag: NEGATIVE

## 2017-01-01 LAB — HEPATITIS B CORE ANTIBODY, TOTAL: HEP B C TOTAL AB: REACTIVE — AB

## 2017-01-01 LAB — HEPATITIS A ANTIBODY, TOTAL: HEP A TOTAL AB: NONREACTIVE

## 2017-01-01 LAB — HEPATITIS B SURFACE ANTIBODY,QUALITATIVE

## 2017-01-04 LAB — LIVER FIBROSIS, FIBROTEST-ACTITEST
ALPHA-2-MACROGLOBULIN: 286 mg/dL — AB (ref 106–279)
ALT: 12 U/L (ref 6–29)
APOLIPOPROTEIN A1: 154 mg/dL (ref 101–198)
BILIRUBIN: 0.5 mg/dL (ref 0.2–1.2)
FIBROSIS SCORE: 0.26
GGT: 28 U/L (ref 3–70)
Haptoglobin: 194 mg/dL (ref 43–212)
Necroinflammat ACT Score: 0.03
Reference ID: 1812588

## 2017-01-07 ENCOUNTER — Other Ambulatory Visit: Payer: Self-pay | Admitting: Family

## 2017-01-07 NOTE — Telephone Encounter (Signed)
Last refill was 12/03/16

## 2017-01-08 NOTE — Telephone Encounter (Signed)
Faxed

## 2017-01-12 LAB — HCV RNA, QN PCR RFLX GENO, LIPA: HCV RNA, PCR, QN: 7.64 {Log_IU}/mL — AB

## 2017-01-12 LAB — HEPATITIS C GENOTYPE

## 2017-01-13 ENCOUNTER — Other Ambulatory Visit: Payer: Self-pay | Admitting: Internal Medicine

## 2017-01-13 MED ORDER — LEDIPASVIR-SOFOSBUVIR 90-400 MG PO TABS: 1.0000 | ORAL_TABLET | Freq: Every day | ORAL | 2 refills | Status: DC

## 2017-01-19 ENCOUNTER — Other Ambulatory Visit: Payer: Self-pay | Admitting: Pharmacist

## 2017-01-19 ENCOUNTER — Encounter: Payer: Self-pay | Admitting: Pharmacist

## 2017-01-19 ENCOUNTER — Telehealth: Payer: Self-pay | Admitting: *Deleted

## 2017-01-19 DIAGNOSIS — B182 Chronic viral hepatitis C: Secondary | ICD-10-CM

## 2017-01-19 MED ORDER — SOFOSBUVIR-VELPATASVIR 400-100 MG PO TABS: 1.0000 | ORAL_TABLET | Freq: Every day | ORAL | 2 refills | Status: DC

## 2017-01-19 NOTE — Progress Notes (Signed)
Patient was initially prescribed Harvoni x 12 weeks for her chronic Hep C infection. Patient's insurance prefers [place].  She is genotype 1a, F0/F1, no drug interactions with Epclusa. Will d/c Harvoni and send in Epclusa x 12 weeks to [facility].

## 2017-01-19 NOTE — Telephone Encounter (Signed)
Pt asking about lab results and next steps.  Patient has not started Hep C medication at this time.  Message routed to [place].

## 2017-01-20 MED FILL — EPCLUSA 400 MG-100 MG TAB: 400-100 | 28 days supply | Qty: 28 | Fill #0

## 2017-02-10 ENCOUNTER — Other Ambulatory Visit: Payer: Self-pay | Admitting: Family

## 2017-02-11 NOTE — Telephone Encounter (Signed)
Last refill was 01/07/17

## 2017-02-11 NOTE — Telephone Encounter (Signed)
Faxed

## 2017-02-17 MED FILL — EPCLUSA 400 MG-100 MG TAB: 400-100 | 28 days supply | Qty: 28 | Fill #1

## 2017-02-18 ENCOUNTER — Ambulatory Visit (INDEPENDENT_AMBULATORY_CARE_PROVIDER_SITE_OTHER): Payer: Medicare Other | Admitting: Pharmacist Clinician (PhC)/ Clinical Pharmacy Specialist

## 2017-02-18 ENCOUNTER — Encounter: Payer: Self-pay | Admitting: Pharmacy Technician

## 2017-02-18 DIAGNOSIS — B182 Chronic viral hepatitis C: Secondary | ICD-10-CM | POA: Diagnosis not present

## 2017-02-18 NOTE — Patient Instructions (Signed)
Continue Epclusa x 3 months Come back for lab at the end of therapy Come for the cure lab in Sept Come back for the cure visit in Sept

## 2017-02-18 NOTE — Progress Notes (Signed)
HPI: Catherine Rogers is a 56 y.o. female who is here for her hep C follow up with pharmacy.  Lab Results  Component Value Date   HCVGENOTYPE 1a 12/31/2016    Allergies: Allergies  Allergen Reactions  . Other Hives and Itching    Highly acidic foods "make me itch and break out" oranges, tomatoes  . Demerol [Meperidine] Nausea And Vomiting  . Morphine And Related Nausea And Vomiting  . Oxycodone Itching  . Penicillins Other (See Comments)    Childhood allergy  . Tylenol [Acetaminophen] Nausea And Vomiting  . Ibuprofen Nausea And Vomiting    Vitals:    Past Medical History: Past Medical History:  Diagnosis Date  . Anemia   . Anxiety   . Arthritis   . Asthma   . Cancer ([place])    colon and ovarian  . Complication of anesthesia    woke and saw long tube coming out of mouth . sat up then went back to sleep  . Depression   . Ectopic pregnancy   . Family history of adverse reaction to anesthesia    sister on dialysis- hypotension   . Family history of ovarian cancer   . GERD (gastroesophageal reflux disease)    occasional  . Head injury, closed, with brief LOC ([place])    had a cut- as a child  . Headache   . History of blood transfusion    2005 approximately   . Hypertension   . PAD (peripheral artery disease) ([place]) 04/18/2015  . Pelvic cyst   . PONV (postoperative nausea and vomiting)   . PTSD (post-traumatic stress disorder)     Social History: Social History   Social History  . Marital status: Widowed    Spouse name: N/A  . Number of children: 6  . Years of education: 12   Occupational History  . Disability    Social History Main Topics  . Smoking status: Never Smoker  . Smokeless tobacco: Never Used     Comment: pack per month of cigars = 5  . Alcohol use Not on file  . Drug use: No     Comment: 04/17/15- 2 weeks ago  . Sexual activity: No   Other Topics Concern  . Not on file   Social History Narrative   Born and raised in [place], [place].  Currently resides in a house by herself. No pets. Fun: Read, shoot pool.    Denies religious beliefs that would effect health care.    Lives at home alone.   Right-handed.   No caffeine use.   Denies abuse and feels safe at home.     Labs: Hep B S Ab (no units)  Date Value  12/31/2016 INDETER (A)   Hepatitis B Surface Ag (no units)  Date Value  12/31/2016 NEGATIVE   HCV Ab (no units)  Date Value  09/04/2016 REACTIVE (A)    Lab Results  Component Value Date   HCVGENOTYPE 1a 12/31/2016    Hepatitis C RNA quantitative Latest Ref Rng & Units 09/04/2016  HCV Quantitative <15 IU/mL 17,821,139(H)  HCV Quantitative Log <1.18 log 10 7.25(H)    AST (U/L)  Date Value  11/06/2016 19  08/18/2016 22  07/10/2016 20  03/17/2016 23   ALT (U/L)  Date Value  12/31/2016 12  11/06/2016 14  08/18/2016 14  07/10/2016 11  03/17/2016 15   INR (no units)  Date Value  04/18/2015 1.07  09/16/2014 1.06    CrCl: CrCl cannot be calculated (  Patient's most recent lab result is older than the maximum 21 days allowed.).  Fibrosis Score: F0/1 as assessed by Fibrosure  Child-Pugh Score: Class A  Previous Treatment Regimen: None  Assessment: [place] started on her Epclusa on 3/6 for her 1a. She has had no side effects with the current therapy. She had a lot questions during the visit including "I'm afraid of touching people because I fear that I could pass it to them". Explained to her the risk of transmitting hep C. Told her to avoid those thoughts. Otherwise, she is very religious with her adherence. Explained to her that her chance of cure is very high with Epclusa and her fibrosis score. We are going to get labs today, SVR12, then follow up with pharmacy for her cure visit. All of the appts are set up.   Recommendations:  Cont Epclusa 1 PO qday x3 mo Hep C VL today and EOT VL SVR12 then pharmacy for cure visit  Minh Pham, Pharm.D., BCPS, AAHIVP Clinical Infectious [facility] for Infectious Disease 02/18/2017, 2:26 PM

## 2017-02-20 LAB — HEPATITIS C RNA QUANTITATIVE
HCV QUANT LOG: 2.1 {Log_IU}/mL — AB
HCV QUANT: 125 [IU]/mL — AB

## 2017-02-27 ENCOUNTER — Ambulatory Visit (HOSPITAL_COMMUNITY)
Admission: RE | Admit: 2017-02-27 | Discharge: 2017-02-27 | Disposition: A | Discharge status: Home / Self Care | Payer: Medicare Other | Source: Ambulatory Visit | Attending: Hematology | Admitting: Hematology

## 2017-02-27 DIAGNOSIS — Z1502 Genetic susceptibility to malignant neoplasm of ovary: Secondary | ICD-10-CM | POA: Insufficient documentation

## 2017-02-27 DIAGNOSIS — C189 Malignant neoplasm of colon, unspecified: Secondary | ICD-10-CM | POA: Diagnosis not present

## 2017-02-27 DIAGNOSIS — Z1509 Genetic susceptibility to other malignant neoplasm: Secondary | ICD-10-CM

## 2017-02-27 DIAGNOSIS — Z1239 Encounter for other screening for malignant neoplasm of breast: Secondary | ICD-10-CM | POA: Diagnosis not present

## 2017-02-27 DIAGNOSIS — Z1501 Genetic susceptibility to malignant neoplasm of breast: Secondary | ICD-10-CM | POA: Diagnosis not present

## 2017-02-27 MED ORDER — GADOBENATE DIMEGLUMINE 529 MG/ML IV SOLN
20.0000 mL | Freq: Once | INTRAVENOUS | Status: AC | PRN
  Administered 2017-02-27: 17 mL via INTRAVENOUS

## 2017-03-05 ENCOUNTER — Other Ambulatory Visit: Payer: Self-pay | Admitting: *Deleted

## 2017-03-05 DIAGNOSIS — R928 Other abnormal and inconclusive findings on diagnostic imaging of breast: Secondary | ICD-10-CM

## 2017-03-06 ENCOUNTER — Other Ambulatory Visit: Payer: Self-pay

## 2017-03-06 ENCOUNTER — Encounter (HOSPITAL_COMMUNITY): Payer: Self-pay | Admitting: Emergency Medicine

## 2017-03-06 ENCOUNTER — Other Ambulatory Visit (HOSPITAL_BASED_OUTPATIENT_CLINIC_OR_DEPARTMENT_OTHER): Payer: Medicare Other

## 2017-03-06 ENCOUNTER — Emergency Department (HOSPITAL_COMMUNITY)
Admission: EM | Admit: 2017-03-06 | Discharge: 2017-03-06 | Disposition: A | Discharge status: Home / Self Care | Payer: Medicare Other | Source: Home / Self Care

## 2017-03-06 ENCOUNTER — Ambulatory Visit (HOSPITAL_COMMUNITY)
Admission: RE | Admit: 2017-03-06 | Discharge: 2017-03-06 | Disposition: A | Discharge status: Home / Self Care | Payer: Medicare Other | Source: Ambulatory Visit | Attending: Hematology | Admitting: Hematology

## 2017-03-06 ENCOUNTER — Emergency Department (HOSPITAL_COMMUNITY): Payer: Medicare Other

## 2017-03-06 DIAGNOSIS — Z9889 Other specified postprocedural states: Secondary | ICD-10-CM | POA: Diagnosis not present

## 2017-03-06 DIAGNOSIS — R918 Other nonspecific abnormal finding of lung field: Secondary | ICD-10-CM | POA: Diagnosis not present

## 2017-03-06 DIAGNOSIS — C182 Malignant neoplasm of ascending colon: Secondary | ICD-10-CM

## 2017-03-06 DIAGNOSIS — Z5321 Procedure and treatment not carried out due to patient leaving prior to being seen by health care provider: Secondary | ICD-10-CM | POA: Insufficient documentation

## 2017-03-06 DIAGNOSIS — Z90710 Acquired absence of both cervix and uterus: Secondary | ICD-10-CM | POA: Diagnosis not present

## 2017-03-06 DIAGNOSIS — C562 Malignant neoplasm of left ovary: Secondary | ICD-10-CM | POA: Diagnosis not present

## 2017-03-06 DIAGNOSIS — R200 Anesthesia of skin: Secondary | ICD-10-CM

## 2017-03-06 DIAGNOSIS — C189 Malignant neoplasm of colon, unspecified: Secondary | ICD-10-CM | POA: Diagnosis not present

## 2017-03-06 LAB — CBC WITH DIFFERENTIAL/PLATELET
BASO%: 0.4 % (ref 0.0–2.0)
Basophils Absolute: 0 10*3/uL (ref 0.0–0.1)
EOS%: 1 % (ref 0.0–7.0)
Eosinophils Absolute: 0.1 10*3/uL (ref 0.0–0.5)
HEMATOCRIT: 43.9 % (ref 34.8–46.6)
HGB: 15.4 g/dL (ref 11.6–15.9)
LYMPH%: 52.3 % — AB (ref 14.0–49.7)
MCH: 35.5 pg — AB (ref 25.1–34.0)
MCHC: 35.1 g/dL (ref 31.5–36.0)
MCV: 100.9 fL (ref 79.5–101.0)
MONO#: 0.4 10*3/uL (ref 0.1–0.9)
MONO%: 6.4 % (ref 0.0–14.0)
NEUT#: 2.6 10*3/uL (ref 1.5–6.5)
NEUT%: 39.9 % (ref 38.4–76.8)
Platelets: 186 10*3/uL (ref 145–400)
RBC: 4.35 10*6/uL (ref 3.70–5.45)
RDW: 13.5 % (ref 11.2–14.5)
WBC: 6.5 10*3/uL (ref 3.9–10.3)
lymph#: 3.4 10*3/uL — ABNORMAL HIGH (ref 0.9–3.3)

## 2017-03-06 LAB — COMPREHENSIVE METABOLIC PANEL
ALT: 6 U/L (ref 0–55)
AST: 15 U/L (ref 5–34)
Albumin: 4.2 g/dL (ref 3.5–5.0)
Alkaline Phosphatase: 91 U/L (ref 40–150)
Anion Gap: 8 mEq/L (ref 3–11)
BUN: 10.3 mg/dL (ref 7.0–26.0)
CALCIUM: 9.7 mg/dL (ref 8.4–10.4)
CHLORIDE: 105 meq/L (ref 98–109)
CO2: 27 meq/L (ref 22–29)
Creatinine: 0.9 mg/dL (ref 0.6–1.1)
EGFR: 80 mL/min/{1.73_m2} — ABNORMAL LOW (ref >90)
Glucose: 88 mg/dl (ref 70–140)
POTASSIUM: 3.9 meq/L (ref 3.5–5.1)
SODIUM: 140 meq/L (ref 136–145)
Total Bilirubin: 0.48 mg/dL (ref 0.20–1.20)
Total Protein: 8.3 g/dL (ref 6.4–8.3)

## 2017-03-06 LAB — CEA (IN HOUSE-CHCC): CEA (CHCC-In House): 1.68 ng/mL (ref 0.00–5.00)

## 2017-03-06 MED ORDER — IOPAMIDOL (ISOVUE-300) INJECTION 61%
INTRAVENOUS | Status: AC
  Filled 2017-03-06: qty 100

## 2017-03-06 MED ORDER — ALBUTEROL SULFATE (2.5 MG/3ML) 0.083% IN NEBU: 5.0000 mg | INHALATION_SOLUTION | Freq: Once | RESPIRATORY_TRACT | Status: DC

## 2017-03-06 MED ORDER — IOPAMIDOL (ISOVUE-300) INJECTION 61%
100.0000 mL | Freq: Once | INTRAVENOUS | Status: AC | PRN
  Administered 2017-03-06: 100 mL via INTRAVENOUS

## 2017-03-06 NOTE — ED Triage Notes (Signed)
Pt c/o pain and numbness in l/leg and pain in midsternal pressure. Pt stated that she was having difficulty breathing. Pt was seen at the [facility] today  and in radiology. Pt the walked from [facility]. Stated that l/leg feels cold. Pt is alert and appropriate. Anxious and short of breath. C/o "panic attack. Hx vascular surgery in l/lag

## 2017-03-06 NOTE — ED Notes (Signed)
Pt refused chest xray when xray came to get patient.

## 2017-03-06 NOTE — ED Notes (Signed)
Pt LWBS after triage. She stated that she felt much better and did not feel like she needed to stay. This RN encouraged her to stay, but she stated that she was no longer having any issues. A&Ox4. Ambulated out of ED in no distress.

## 2017-03-09 LAB — CEA: CEA1: 2.3 ng/mL (ref 0.0–4.7)

## 2017-03-09 LAB — CA 125: CANCER ANTIGEN (CA) 125: 6.2 U/mL (ref 0.0–38.1)

## 2017-03-11 ENCOUNTER — Ambulatory Visit
Admission: RE | Admit: 2017-03-11 | Discharge: 2017-03-11 | Disposition: A | Discharge status: Home / Self Care | Payer: Medicare Other | Source: Ambulatory Visit | Attending: Hematology | Admitting: Hematology

## 2017-03-11 DIAGNOSIS — N6489 Other specified disorders of breast: Secondary | ICD-10-CM | POA: Diagnosis not present

## 2017-03-11 DIAGNOSIS — R928 Other abnormal and inconclusive findings on diagnostic imaging of breast: Secondary | ICD-10-CM | POA: Diagnosis not present

## 2017-03-13 ENCOUNTER — Encounter: Payer: Medicare Other | Admitting: Hematology

## 2017-03-15 NOTE — Progress Notes (Signed)
No show  This encounter was created in error - please disregard.

## 2017-03-16 ENCOUNTER — Other Ambulatory Visit: Payer: Self-pay | Admitting: Family

## 2017-03-16 MED FILL — EPCLUSA 400 MG-100 MG TAB: 400-100 | 28 days supply | Qty: 28 | Fill #2

## 2017-03-16 NOTE — Telephone Encounter (Signed)
Last refill was 02/11/17

## 2017-03-18 NOTE — Telephone Encounter (Signed)
Faxed script back to [facility]...lmb 

## 2017-03-25 ENCOUNTER — Other Ambulatory Visit: Payer: Self-pay | Admitting: Surgery

## 2017-03-25 DIAGNOSIS — N649 Disorder of breast, unspecified: Secondary | ICD-10-CM | POA: Diagnosis not present

## 2017-03-31 ENCOUNTER — Encounter (HOSPITAL_BASED_OUTPATIENT_CLINIC_OR_DEPARTMENT_OTHER): Payer: Self-pay | Admitting: *Deleted

## 2017-04-01 NOTE — H&P (Signed)
Catherine Rogers 03/25/2017 3:03 PM Location: [facility] Patient #: [id number] DOB: 10/02/1961 Single / Language: [name] / Race: Black or African American Female   History of Present Illness (Hanad Leino A. Ninfa Linden MD; 03/25/2017 3:59 PM) Patient words: Abnormal MRI.  The patient is a 56 year old female who presents with a complaint of Breast problems. This is a patient of ours who was operated on for colon cancer by Dr. Hulen Skains in 2015. She also has a history of ovarian cancer and also carries the BRCA gene for breast cancer. She has refused bilateral mastectomies. She more recently had an MRI of her breast which shows thickening of the right nipple which is worrisome for malignancy. MRI and ultrasound were negative. It is been recommended she undergo a biopsy of the nipple. She has had no nipple discharge. She reports that it is possibly protruding and somewhat uncomfortable on the right side.   Allergies Malachi Bonds, CMA; 03/25/2017 3:03 PM) Demerol *ANALGESICS - OPIOID*  Morphine Sulfate (Concentrate) *ANALGESICS - OPIOID*  Penicillin G Potassium *PENICILLINS*   Medication History (Chemira Jones, CMA; 03/25/2017 3:04 PM) Lyrica (200MG Capsule, Oral) Active. Tylenol (500MG Capsule, Oral) Active.  Vitals (Chemira Jones CMA; 03/25/2017 3:03 PM) 03/25/2017 3:03 PM Weight: 184.2 lb Height: 67in Body Surface Area: 1.95 m Body Mass Index: 28.85 kg/m  Temp.: 75F(Oral)  BP: 110/80 (Sitting, Left Arm, Standard)       Physical Exam (Ceaira Ernster A. Ninfa Linden MD; 03/25/2017 3:59 PM) The physical exam findings are as follows: Note:On examination, I can palpate no mass in the right breast. There is no axillary adenopathy. There is mild thickening of the nipple diffusely. Lungs clear CV RRR Abdomen soft, NT Ext without edema    Assessment & Plan (Kitzia Camus A. Ninfa Linden MD; 03/25/2017 4:00 PM) NIPPLE LESION (N64.9) Impression: A biopsy of her nipples recommended given her  strong history of cancer as well as genetic predisposition for breast cancer. I believe this would best be done in the operating room rather than a punch biopsy given the location. I would also be able to sedate her as well and the specimen will need to give fresh to pathology which we cannot do here in the office. I discussed the procedure with her in detail. I discussed the risks of bleeding, infection, need for further surgery for malignancy is found, etc. She understands and agrees to proceed with surgery

## 2017-04-02 ENCOUNTER — Ambulatory Visit (HOSPITAL_BASED_OUTPATIENT_CLINIC_OR_DEPARTMENT_OTHER)
Admission: RE | Admit: 2017-04-02 | Discharge: 2017-04-02 | Disposition: A | Discharge status: Home / Self Care | Payer: Medicare Other | Source: Ambulatory Visit | Attending: Surgery | Admitting: Surgery

## 2017-04-02 ENCOUNTER — Encounter (HOSPITAL_BASED_OUTPATIENT_CLINIC_OR_DEPARTMENT_OTHER)
Admission: RE | Disposition: A | Discharge status: Home / Self Care | Payer: Self-pay | Source: Ambulatory Visit | Attending: Surgery

## 2017-04-02 ENCOUNTER — Ambulatory Visit (HOSPITAL_BASED_OUTPATIENT_CLINIC_OR_DEPARTMENT_OTHER): Payer: Medicare Other | Admitting: Anesthesiology

## 2017-04-02 ENCOUNTER — Encounter (HOSPITAL_BASED_OUTPATIENT_CLINIC_OR_DEPARTMENT_OTHER): Payer: Self-pay | Admitting: Anesthesiology

## 2017-04-02 DIAGNOSIS — Z880 Allergy status to penicillin: Secondary | ICD-10-CM | POA: Insufficient documentation

## 2017-04-02 DIAGNOSIS — Z79899 Other long term (current) drug therapy: Secondary | ICD-10-CM | POA: Insufficient documentation

## 2017-04-02 DIAGNOSIS — F418 Other specified anxiety disorders: Secondary | ICD-10-CM | POA: Diagnosis not present

## 2017-04-02 DIAGNOSIS — Z1501 Genetic susceptibility to malignant neoplasm of breast: Secondary | ICD-10-CM | POA: Insufficient documentation

## 2017-04-02 DIAGNOSIS — Z888 Allergy status to other drugs, medicaments and biological substances status: Secondary | ICD-10-CM | POA: Insufficient documentation

## 2017-04-02 DIAGNOSIS — J45909 Unspecified asthma, uncomplicated: Secondary | ICD-10-CM | POA: Diagnosis not present

## 2017-04-02 DIAGNOSIS — Z85038 Personal history of other malignant neoplasm of large intestine: Secondary | ICD-10-CM | POA: Insufficient documentation

## 2017-04-02 DIAGNOSIS — I10 Essential (primary) hypertension: Secondary | ICD-10-CM | POA: Insufficient documentation

## 2017-04-02 DIAGNOSIS — N6489 Other specified disorders of breast: Secondary | ICD-10-CM | POA: Insufficient documentation

## 2017-04-02 DIAGNOSIS — Z885 Allergy status to narcotic agent status: Secondary | ICD-10-CM | POA: Insufficient documentation

## 2017-04-02 DIAGNOSIS — Z8543 Personal history of malignant neoplasm of ovary: Secondary | ICD-10-CM | POA: Insufficient documentation

## 2017-04-02 DIAGNOSIS — N6459 Other signs and symptoms in breast: Secondary | ICD-10-CM | POA: Diagnosis not present

## 2017-04-02 DIAGNOSIS — D649 Anemia, unspecified: Secondary | ICD-10-CM | POA: Insufficient documentation

## 2017-04-02 DIAGNOSIS — M1990 Unspecified osteoarthritis, unspecified site: Secondary | ICD-10-CM | POA: Diagnosis not present

## 2017-04-02 DIAGNOSIS — K219 Gastro-esophageal reflux disease without esophagitis: Secondary | ICD-10-CM | POA: Insufficient documentation

## 2017-04-02 DIAGNOSIS — N649 Disorder of breast, unspecified: Secondary | ICD-10-CM | POA: Diagnosis not present

## 2017-04-02 HISTORY — PX: BREAST BIOPSY: SHX20

## 2017-04-02 HISTORY — DX: Inflammatory liver disease, unspecified: K75.9

## 2017-04-02 SURGERY — BREAST BIOPSY
Anesthesia: General | Site: Breast | Laterality: Right

## 2017-04-02 MED ORDER — BUPIVACAINE HCL (PF) 0.25 % IJ SOLN
INTRAMUSCULAR | Status: DC | PRN
  Administered 2017-04-02: 5 mL

## 2017-04-02 MED ORDER — CHLORHEXIDINE GLUCONATE CLOTH 2 % EX PADS: 6.0000 | MEDICATED_PAD | Freq: Once | CUTANEOUS | Status: DC

## 2017-04-02 MED ORDER — HYDROCODONE-ACETAMINOPHEN 5-325 MG PO TABS
1.0000 | ORAL_TABLET | Freq: Four times a day (QID) | ORAL | Status: DC | PRN
  Administered 2017-04-02: 1 via ORAL

## 2017-04-02 MED ORDER — FENTANYL CITRATE (PF) 100 MCG/2ML IJ SOLN
50.0000 ug | INTRAMUSCULAR | Status: DC | PRN
  Administered 2017-04-02 (×2): 50 ug via INTRAVENOUS

## 2017-04-02 MED ORDER — HYDROCODONE-ACETAMINOPHEN 5-325 MG PO TABS: 1.0000 | ORAL_TABLET | ORAL | 0 refills | Status: DC | PRN

## 2017-04-02 MED ORDER — LIDOCAINE HCL (CARDIAC) 20 MG/ML IV SOLN
INTRAVENOUS | Status: DC | PRN
  Administered 2017-04-02: 60 mg via INTRAVENOUS

## 2017-04-02 MED ORDER — MIDAZOLAM HCL 2 MG/2ML IJ SOLN
INTRAMUSCULAR | Status: AC
  Filled 2017-04-02: qty 2

## 2017-04-02 MED ORDER — HYDROCODONE-ACETAMINOPHEN 5-325 MG PO TABS
ORAL_TABLET | ORAL | Status: AC
  Filled 2017-04-02: qty 1

## 2017-04-02 MED ORDER — SODIUM CHLORIDE 0.9 % IV SOLN: 250.0000 mL | INTRAVENOUS | Status: DC | PRN

## 2017-04-02 MED ORDER — SODIUM CHLORIDE 0.9% FLUSH: 3.0000 mL | INTRAVENOUS | Status: DC | PRN

## 2017-04-02 MED ORDER — ONDANSETRON HCL 4 MG/2ML IJ SOLN
INTRAMUSCULAR | Status: DC | PRN
  Administered 2017-04-02: 4 mg via INTRAVENOUS

## 2017-04-02 MED ORDER — CIPROFLOXACIN IN D5W 400 MG/200ML IV SOLN
INTRAVENOUS | Status: AC
  Filled 2017-04-02: qty 200

## 2017-04-02 MED ORDER — FENTANYL CITRATE (PF) 100 MCG/2ML IJ SOLN
INTRAMUSCULAR | Status: AC
  Filled 2017-04-02: qty 2

## 2017-04-02 MED ORDER — SCOPOLAMINE 1 MG/3DAYS TD PT72: 1.0000 | MEDICATED_PATCH | Freq: Once | TRANSDERMAL | Status: DC | PRN

## 2017-04-02 MED ORDER — ACETAMINOPHEN 325 MG PO TABS: 650.0000 mg | ORAL_TABLET | ORAL | Status: DC | PRN

## 2017-04-02 MED ORDER — HYDROCODONE-ACETAMINOPHEN 7.5-325 MG PO TABS: 1.0000 | ORAL_TABLET | Freq: Once | ORAL | Status: DC | PRN

## 2017-04-02 MED ORDER — ACETAMINOPHEN 650 MG RE SUPP: 650.0000 mg | RECTAL | Status: DC | PRN

## 2017-04-02 MED ORDER — MIDAZOLAM HCL 2 MG/2ML IJ SOLN
1.0000 mg | INTRAMUSCULAR | Status: DC | PRN
  Administered 2017-04-02: 2 mg via INTRAVENOUS

## 2017-04-02 MED ORDER — CIPROFLOXACIN IN D5W 400 MG/200ML IV SOLN
400.0000 mg | INTRAVENOUS | Status: AC
  Administered 2017-04-02: 400 mg via INTRAVENOUS

## 2017-04-02 MED ORDER — PROPOFOL 10 MG/ML IV BOLUS
INTRAVENOUS | Status: DC | PRN
  Administered 2017-04-02: 200 mg via INTRAVENOUS

## 2017-04-02 MED ORDER — BUPIVACAINE-EPINEPHRINE (PF) 0.5% -1:200000 IJ SOLN
INTRAMUSCULAR | Status: AC
  Filled 2017-04-02: qty 30

## 2017-04-02 MED ORDER — FENTANYL CITRATE (PF) 100 MCG/2ML IJ SOLN
25.0000 ug | INTRAMUSCULAR | Status: DC | PRN
  Administered 2017-04-02 (×2): 25 ug via INTRAVENOUS

## 2017-04-02 MED ORDER — MORPHINE SULFATE (PF) 2 MG/ML IV SOLN: 1.0000 mg | INTRAVENOUS | Status: DC | PRN

## 2017-04-02 MED ORDER — SODIUM CHLORIDE 0.9% FLUSH: 3.0000 mL | Freq: Two times a day (BID) | INTRAVENOUS | Status: DC

## 2017-04-02 MED ORDER — LACTATED RINGERS IV SOLN
INTRAVENOUS | Status: DC
  Administered 2017-04-02 (×2): via INTRAVENOUS

## 2017-04-02 SURGICAL SUPPLY — 43 items
ADH SKN CLS APL DERMABOND .7 (GAUZE/BANDAGES/DRESSINGS) ×1
BLADE HEX COATED 2.75 (ELECTRODE) ×2 IMPLANT
BLADE SURG 15 STRL LF DISP TIS (BLADE) ×1 IMPLANT
BLADE SURG 15 STRL SS (BLADE) ×2
CANISTER SUCT 1200ML W/VALVE (MISCELLANEOUS) IMPLANT
CHLORAPREP W/TINT 26ML (MISCELLANEOUS) ×2 IMPLANT
CLIP TI WIDE RED SMALL 6 (CLIP) IMPLANT
COVER BACK TABLE 60X90IN (DRAPES) ×2 IMPLANT
COVER MAYO STAND STRL (DRAPES) ×2 IMPLANT
DECANTER SPIKE VIAL GLASS SM (MISCELLANEOUS) IMPLANT
DERMABOND ADVANCED (GAUZE/BANDAGES/DRESSINGS) ×1
DERMABOND ADVANCED .7 DNX12 (GAUZE/BANDAGES/DRESSINGS) ×1 IMPLANT
DEVICE DUBIN W/COMP PLATE 8390 (MISCELLANEOUS) IMPLANT
DRAPE LAPAROTOMY 100X72 PEDS (DRAPES) ×2 IMPLANT
DRAPE UTILITY XL STRL (DRAPES) ×2 IMPLANT
ELECT REM PT RETURN 9FT ADLT (ELECTROSURGICAL) ×2
ELECTRODE REM PT RTRN 9FT ADLT (ELECTROSURGICAL) ×1 IMPLANT
GAUZE SPONGE 4X4 12PLY STRL LF (GAUZE/BANDAGES/DRESSINGS) ×1 IMPLANT
GLOVE BIO SURGEON STRL SZ7 (GLOVE) ×1 IMPLANT
GLOVE BIOGEL PI IND STRL 7.5 (GLOVE) IMPLANT
GLOVE BIOGEL PI INDICATOR 7.5 (GLOVE) ×1
GLOVE SURG SIGNA 7.5 PF LTX (GLOVE) ×2 IMPLANT
GOWN STRL REUS W/ TWL LRG LVL3 (GOWN DISPOSABLE) ×1 IMPLANT
GOWN STRL REUS W/ TWL XL LVL3 (GOWN DISPOSABLE) ×1 IMPLANT
GOWN STRL REUS W/TWL LRG LVL3 (GOWN DISPOSABLE) ×2
GOWN STRL REUS W/TWL XL LVL3 (GOWN DISPOSABLE) ×2
KIT MARKER MARGIN INK (KITS) ×1 IMPLANT
NDL HYPO 25X1 1.5 SAFETY (NEEDLE) ×1 IMPLANT
NEEDLE HYPO 25X1 1.5 SAFETY (NEEDLE) ×2 IMPLANT
NS IRRIG 1000ML POUR BTL (IV SOLUTION) ×1 IMPLANT
PACK BASIN DAY SURGERY FS (CUSTOM PROCEDURE TRAY) ×2 IMPLANT
PENCIL BUTTON HOLSTER BLD 10FT (ELECTRODE) ×2 IMPLANT
SLEEVE SCD COMPRESS KNEE MED (MISCELLANEOUS) ×1 IMPLANT
SPONGE LAP 4X18 X RAY DECT (DISPOSABLE) ×2 IMPLANT
SUT MNCRL AB 4-0 PS2 18 (SUTURE) ×2 IMPLANT
SUT SILK 2 0 SH (SUTURE) ×2 IMPLANT
SUT VIC AB 3-0 SH 27 (SUTURE) ×2
SUT VIC AB 3-0 SH 27X BRD (SUTURE) ×1 IMPLANT
SYR CONTROL 10ML LL (SYRINGE) ×2 IMPLANT
TOWEL OR 17X24 6PK STRL BLUE (TOWEL DISPOSABLE) ×2 IMPLANT
TOWEL OR NON WOVEN STRL DISP B (DISPOSABLE) ×2 IMPLANT
TUBE CONNECTING 20X1/4 (TUBING) IMPLANT
YANKAUER SUCT BULB TIP NO VENT (SUCTIONS) IMPLANT

## 2017-04-02 NOTE — Transfer of Care (Signed)
Immediate Anesthesia Transfer of Care Note  Patient: [name]  Procedure(s) Performed: Procedure(s): INCISIONAL BIOPSY RIGHT NIPPLE (Right)  Patient Location: PACU  Anesthesia Type:General  Level of Consciousness: awake, alert  and drowsy  Airway & Oxygen Therapy: Patient Spontanous Breathing and Patient connected to face mask oxygen  Post-op Assessment: Report given to RN and Post -op Vital signs reviewed and stable  Post vital signs: Reviewed and stable  Last Vitals:  Vitals:   04/02/17 1146 04/02/17 1147  BP:    Pulse: 65 71  Resp:  11  Temp:  36.5 C    Last Pain:  Vitals:   04/02/17 1103  TempSrc: Oral  PainSc: 6          Complications: No apparent anesthesia complications

## 2017-04-02 NOTE — Op Note (Signed)
INCISIONAL BIOPSY RIGHT NIPPLE  Procedure Note  Catherine Rogers 04/02/2017   Pre-op Diagnosis: right nipple lesion/thickening     Post-op Diagnosis: same  Procedure(s): INCISIONAL BIOPSY RIGHT NIPPLE  Surgeon(s): Coralie Keens, MD  Anesthesia: General  Staff:  Circulator: Ted Mcalpine, RN; Cliffton Asters, RN Scrub Person: Maurene Capes, RN  Estimated Blood Loss: Minimal               Specimens: sent to path  Indications: This is a 56 year old female with a history of colon cancer and ovarian cancer as well as BRCA positivity who was recently found on breast MRI to have some thickening and enhancement of the nipple of the right breast. Mammogram no similar normal. Biopsy of the right nipple is been requested  Procedure: The patient was brought to the operative room and identified as the correct patient. She is placed upon the operating table and general anesthesia was induced. Her right breast and prepped and draped in usual sterile fashion. I used a scalpel to excise a wedge piece of skin including the nipple and areola at the superior location. I taken down into the nipple tissue with the scalpel and sent to pieces of tissue. I achieved him states with the cautery. I closed the incision with interrupted 4-0 Monocryl sutures and Dermabond. The patient tolerated the procedure well. All counts were correct advanced procedure. The patient was then extubated in the operating room and taken in a stable condition to the recovery room.          Ravi Tuccillo A   Date: 04/02/2017  Time: 11:42 AM

## 2017-04-02 NOTE — Anesthesia Preprocedure Evaluation (Signed)
Anesthesia Evaluation  Patient identified by MRN, date of birth, ID band Patient awake    Reviewed: Allergy & Precautions, NPO status , Patient's Chart, lab work & pertinent test results  History of Anesthesia Complications (+) PONV and history of anesthetic complications  Airway Mallampati: I  TM Distance: >3 FB Neck ROM: Full    Dental  (+) Missing, Poor Dentition, Dental Advisory Given, Chipped,    Pulmonary asthma , Current Smoker,    breath sounds clear to auscultation       Cardiovascular hypertension, Pt. on medications (-) angina+ Peripheral Vascular Disease  (-) Past MI  Rhythm:Regular     Neuro/Psych  Headaches, PSYCHIATRIC DISORDERS Anxiety Depression  Neuromuscular disease    GI/Hepatic GERD  Controlled,(+) Hepatitis -, C  Endo/Other    Renal/GU      Musculoskeletal  (+) Arthritis ,   Abdominal   Peds  Hematology  (+) anemia ,   Anesthesia Other Findings   Reproductive/Obstetrics                             Anesthesia Physical Anesthesia Plan  ASA: III  Anesthesia Plan: General   Post-op Pain Management:    Induction: Intravenous  Airway Management Planned: LMA  Additional Equipment: None  Intra-op Plan:   Post-operative Plan: Extubation in OR  Informed Consent: I have reviewed the patients History and Physical, chart, labs and discussed the procedure including the risks, benefits and alternatives for the proposed anesthesia with the patient or authorized representative who has indicated his/her understanding and acceptance.   Dental advisory given  Plan Discussed with: CRNA and Surgeon  Anesthesia Plan Comments:         Anesthesia Quick Evaluation

## 2017-04-02 NOTE — Anesthesia Procedure Notes (Signed)
Procedure Name: LMA Insertion Date/Time: 04/02/2017 11:17 AM Performed by: Melynda Ripple D Pre-anesthesia Checklist: Patient identified, Emergency Drugs available, Suction available and Patient being monitored Patient Re-evaluated:Patient Re-evaluated prior to inductionOxygen Delivery Method: Circle system utilized Preoxygenation: Pre-oxygenation with 100% oxygen Intubation Type: IV induction Ventilation: Mask ventilation without difficulty LMA: LMA inserted LMA Size: 4.0 Number of attempts: 1 Airway Equipment and Method: Bite block Placement Confirmation: positive ETCO2 Tube secured with: Tape Dental Injury: Teeth and Oropharynx as per pre-operative assessment

## 2017-04-02 NOTE — Discharge Instructions (Signed)
Ok to shower tomorrow  Ice pack also for pain   Call your surgeon if you experience:   1.  Fever over 101.0. 2.  Inability to urinate. 3.  Nausea and/or vomiting. 4.  Extreme swelling or bruising at the surgical site. 5.  Continued bleeding from the incision. 6.  Increased pain, redness or drainage from the incision. 7.  Problems related to your pain medication. 8.  Any problems and/or concerns   Post Anesthesia Home Care Instructions  Activity: Get plenty of rest for the remainder of the day. A responsible individual must stay with you for 24 hours following the procedure.  For the next 24 hours, DO NOT: -Drive a car -Paediatric nurse -Drink alcoholic beverages -Take any medication unless instructed by your physician -Make any legal decisions or sign important papers.  Meals: Start with liquid foods such as gelatin or soup. Progress to regular foods as tolerated. Avoid greasy, spicy, heavy foods. If nausea and/or vomiting occur, drink only clear liquids until the nausea and/or vomiting subsides. Call your physician if vomiting continues.  Special Instructions/Symptoms: Your throat may feel dry or sore from the anesthesia or the breathing tube placed in your throat during surgery. If this causes discomfort, gargle with warm salt water. The discomfort should disappear within 24 hours.  If you had a scopolamine patch placed behind your ear for the management of post- operative nausea and/or vomiting:  1. The medication in the patch is effective for 72 hours, after which it should be removed.  Wrap patch in a tissue and discard in the trash. Wash hands thoroughly with soap and water. 2. You may remove the patch earlier than 72 hours if you experience unpleasant side effects which may include dry mouth, dizziness or visual disturbances. 3. Avoid touching the patch. Wash your hands with soap and water after contact with the patch.

## 2017-04-02 NOTE — Anesthesia Postprocedure Evaluation (Signed)
Anesthesia Post Note  Patient: Catherine Rogers  Procedure(s) Performed: Procedure(s) (LRB): INCISIONAL BIOPSY RIGHT NIPPLE (Right)  Patient location during evaluation: PACU Anesthesia Type: General Level of consciousness: awake and alert Pain management: pain level controlled Vital Signs Assessment: post-procedure vital signs reviewed and stable Respiratory status: spontaneous breathing, nonlabored ventilation, respiratory function stable and patient connected to nasal cannula oxygen Cardiovascular status: blood pressure returned to baseline and stable Postop Assessment: no signs of nausea or vomiting Anesthetic complications: no       Last Vitals:  Vitals:   04/02/17 1245 04/02/17 1315  BP: (!) 157/78 (!) 156/88  Pulse: (!) 52 (!) 54  Resp: 13 14  Temp:  37 C    Last Pain:  Vitals:   04/02/17 1330  TempSrc:   PainSc: 4                  Tiajuana Amass

## 2017-04-02 NOTE — Interval H&P Note (Signed)
History and Physical Interval Note:no change in H and P  04/02/2017 10:41 AM  Catherine Rogers  has presented today for surgery, with the diagnosis of right nipple lesion/thickening  The various methods of treatment have been discussed with the patient and family. After consideration of risks, benefits and other options for treatment, the patient has consented to  Procedure(s): INCISIONAL BIOPSY RIGHT NIPPLE (Right) as a surgical intervention .  The patient's history has been reviewed, patient examined, no change in status, stable for surgery.  I have reviewed the patient's chart and labs.  Questions were answered to the patient's satisfaction.     [name] A

## 2017-04-03 ENCOUNTER — Encounter (HOSPITAL_BASED_OUTPATIENT_CLINIC_OR_DEPARTMENT_OTHER): Payer: Self-pay | Admitting: Surgery

## 2017-04-06 ENCOUNTER — Telehealth: Payer: Self-pay | Admitting: Hematology

## 2017-04-06 ENCOUNTER — Ambulatory Visit (HOSPITAL_BASED_OUTPATIENT_CLINIC_OR_DEPARTMENT_OTHER): Payer: Medicare Other | Admitting: Hematology

## 2017-04-06 VITALS — BP 180/83 | HR 78 | Temp 98.9°F | Resp 18 | Ht 67.0 in | Wt 185.6 lb

## 2017-04-06 DIAGNOSIS — I739 Peripheral vascular disease, unspecified: Secondary | ICD-10-CM | POA: Diagnosis not present

## 2017-04-06 DIAGNOSIS — Z170 Estrogen receptor positive status [ER+]: Secondary | ICD-10-CM

## 2017-04-06 DIAGNOSIS — Z1501 Genetic susceptibility to malignant neoplasm of breast: Secondary | ICD-10-CM

## 2017-04-06 DIAGNOSIS — G629 Polyneuropathy, unspecified: Secondary | ICD-10-CM

## 2017-04-06 DIAGNOSIS — I10 Essential (primary) hypertension: Secondary | ICD-10-CM

## 2017-04-06 DIAGNOSIS — E559 Vitamin D deficiency, unspecified: Secondary | ICD-10-CM

## 2017-04-06 DIAGNOSIS — C182 Malignant neoplasm of ascending colon: Secondary | ICD-10-CM

## 2017-04-06 DIAGNOSIS — C562 Malignant neoplasm of left ovary: Secondary | ICD-10-CM

## 2017-04-06 DIAGNOSIS — Z1509 Genetic susceptibility to other malignant neoplasm: Secondary | ICD-10-CM

## 2017-04-06 NOTE — Telephone Encounter (Signed)
Scheduled appt per 5/14 for labs and 6 month f/u - patient did not want AVS or calender printed out.

## 2017-04-06 NOTE — Progress Notes (Signed)
[place]  Telephone:([phone]) [phone] Fax:(336) [phone]  Clinic follow Up Note   Patient Care Team: Golden Circle, FNP as PCP - General (Family Medicine) Truitt Merle, MD as Consulting Physician (Hematology) Judeth Horn, MD as Consulting Physician (General Surgery) Nat Math, MD as Referring Physician (Obstetrics and Gynecology) Elam Dutch, MD as Consulting Physician (Vascular Surgery) [name], MD as Attending Physician (Gynecologic Oncology) 04/06/2017   CHIEF COMPLAINTS/PURPOSE OF CONSULTATION:  1. Colon cancer, pT3N1M0, stage III 2. Ovarian cancer, pT1cNxM0, stage I  3. BRCA1 mutation (+)    Cancer of right colon ([place])   09/16/2014 Imaging    CT abd/pel:  a 3.8cm cecal mass with associated ileocecal intussusception, and a 9.1cm solid and cystic mass in the left pelvis,      09/18/2014 Initial Diagnosis    Adenocarcinoma of right colon      09/18/2014 Pathologic Stage    pT3pN1Mx, tumor extends into pericolonic soft tissue and is less than 60m from the serosal surface, LVI 8-), PNI (-), all 23 node negative, one soft tissue tumor deposit, surgical margins negative.       09/18/2014 Surgery    right hemicolectomy and terminal ileoectomy      08/06/2016 Imaging    CT chest/abd/pelvis with contrast IMPRESSION: 1. No recurrent malignancy identified. 2. 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 On the prior exam from 09/13/2015 there was back flow of contrast in the left external iliac artery to supply the left internal iliac artery ; this back flow of contrast is no longer present in the left external iliac artery is occluded. 3. Several pulmonary nodules at or below 4 mm diameter, unchanged. 4. 3 mm hypodensity posteriorly in the body of the pancreas, not well seen previously and subtle today, likely incidental, but merits observation. 5. 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 Prominent degenerative spurring of both acetabula. 6. Lumbar spondylosis and degenerative disc disease causing prominent impingement at L4-5 and mild impingement at L5-S1.      03/08/2017 Imaging    CT CAP W Contrast 03/08/17 IMPRESSION: Chest Impression: 1. Stable small pulmonary nodules within LEFT lung. 2. No mediastinal lymphadenopathy. Abdomen / Pelvis Impression: 1. No evidence of metastatic colorectal cancer or ovarian cancer within the abdomen pelvis. 2. No lymphadenopathy. 3. Postsurgical change consistent RIGHT hemicolectomy and hysterectomy 4. A fem-fem arterial bypass noted.       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 ([facility]   10/09/2014 Imaging    CT of the abdomen and pelvis showed a 9.1 cm solid and cystic mass in the left pelvis concerning for malignancy. This was discovered during her workup for colon cancer.      11/01/2014 Initial Diagnosis    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      11/01/2014 Surgery    Salpingo-oophorectomy.      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 Tumor size 3.7 cm, no lymph nodes removed. T1cNx. Tumor cells are positive for cytokeratin 7 and estrogen receptor.      03/08/2017 Imaging    CT CAP W Contrast 03/08/17 IMPRESSION: Chest Impression: 1. Stable small pulmonary nodules within LEFT lung. 2. No mediastinal lymphadenopathy. Abdomen / Pelvis Impression: 1. No evidence of metastatic colorectal cancer or ovarian cancer within the abdomen pelvis. 2. No lymphadenopathy. 3. Postsurgical change consistent RIGHT hemicolectomy  and hysterectomy 4. A fem-fem arterial bypass noted.      03/12/2017 Mammogram    MM Right Breast 03/12/17 IMPRESSION: No mammographic or sonographic evidence of malignancy. No correlate identified for the abnormal enhancement seen within the right nipple on MRI of  02/27/2017.       04/02/2017 Pathology Results    Surgical Pathology of Nipple Biopsy: 04/02/17 Diagnosis Nipple Biopsy, Right - BENIGN NIPPLE TISSUE. - NO ATYPIA OR MALIGNANCY.         HISTORY OF PRESENTING ILLNESS:  Catherine Rogers 56 y.o. female is here because of recently diagnosed colon cancer.   She presented with abdominal pain, nausea and vomiting, hemoptoses in Oct 2015.  She presented to [facility] for worsening symptoms on 09/15/2014 and was subsequently transferred to [facility]. She had a CT scan that revealed a 3.8cm cecal mass with associated ileocecal intussusception, and a 9.1cm solid and cystic mass in the left pelvis, likely a peritoneal met with ascites.  EGD was negative. She had exploratory laparoscopy on 09/18/2014 by Dr. Hulen Skains, and underwent right hemicolectomy and ileocecectomy. The pathology reviewed also related an invasive adenocarcinoma at the terminal ileum and cecum. She was discharged home on 09/22/2014.  She underwent a second surgery for left pelvis cystic mass resection on 11/01/2014, surgical path showed ovarian cancer.  CURRENT TREATMENT:   Surveillance  INTERIM HISTORY: Christena returns for follow-up. She presents to the clinic today leaking from her biopsy site. She is going to her dentist. They are going to put partial dentures in her mouth. She say Dr. Ninfa Linden about breast surgery. She is eating well again. She still have leg pain due to nerve damage. She is taking Lyrica for it but it is not working for her much. Her legs will give out on her and she will not be able to walk. She did PT and still does PT workouts. She has a treadmill and exercise bike to strengthen her leg muscle. She has told her family and children about the BRCA1 and only one tested positive.     MEDICAL HISTORY:  Past Medical History:  Diagnosis Date  . Anemia   . Anxiety   . Arthritis   . Asthma   . Cancer ([place])    colon and ovarian  .  Complication of anesthesia    woke and saw long tube coming out of mouth . sat up then went back to sleep  . Depression   . Ectopic pregnancy   . Family history of adverse reaction to anesthesia    sister on dialysis- hypotension   . Family history of ovarian cancer   . Head injury, closed, with brief LOC ([place])    had a cut- as a child  . Headache   . Hepatitis    being treated for Hep C  . History of blood transfusion    2005 approximately   . Hypertension    no meds now  . PAD (peripheral artery disease) ([place]) 04/18/2015  . Pelvic cyst   . PONV (postoperative nausea and vomiting)   . PTSD (post-traumatic stress disorder)   PTSD   SURGICAL HISTORY: Past Surgical History:  Procedure Laterality Date  . ABDOMINAL HYSTERECTOMY    . BREAST BIOPSY Right 04/02/2017   Procedure: INCISIONAL BIOPSY RIGHT NIPPLE;  Surgeon: Coralie Keens, MD;  Location: [facility];  Service: General;  Laterality: Right;  . COLONOSCOPY    . ESOPHAGOGASTRODUODENOSCOPY N/A 09/17/2014   Procedure: ESOPHAGOGASTRODUODENOSCOPY (EGD);  Surgeon:  Jeryl Columbia, MD;  Location[facility];  Service: Endoscopy;  Laterality: N/A;  . FEMORAL-FEMORAL BYPASS GRAFT Bilateral 04/18/2015   Procedure:  RIGHT COMMON FEMORAL TO LEFT COMMON  FEMORAL  ARTERY BYPASS WITH  8 MM HEMASHIELD GRAFT,RIGHT COMMON SUPERFICIAL FEMORAL ARTERY ENDARTERECTOMY;  Surgeon: Elam Dutch, MD;  Location: [facility];  Service: Vascular;  Laterality: Bilateral;  . ILEOSTOMY Right 09/18/2014   Procedure: ILEOCOLOSTOMY;  Surgeon: Doreen Salvage, MD;  Location: [place];  Service: General;  Laterality: Right;  . LAPAROTOMY N/A 11/01/2014   Procedure: EXPLORATORY LAPAROTOMY;  Surgeon: Doreen Salvage, MD;  Location: [facility];  Service: General;  Laterality: N/A;  . LAPAROTOMY N/A 12/26/2014   Procedure:  LAPAROTOMY ;  Surgeon: Everitt Amber, MD;  Location: [facility];  Service: Gynecology;  Laterality: N/A;  . MASS EXCISION  11/01/2014   Procedure: EXCISION PELVIC  MASS;  Surgeon: Doreen Salvage, MD;  Location: [place];  Service: General;;  . OOPHORECTOMY  2016  . PARTIAL COLECTOMY Right 09/18/2014   Procedure: RIGHT HEMI COLECTOMY;  Surgeon: Doreen Salvage, MD;  Location: [place];  Service: General;  Laterality: Right;  . ROBOTIC ASSISTED TOTAL HYSTERECTOMY WITH BILATERAL SALPINGO OOPHERECTOMY Right 12/26/2014   Procedure: EXPLORATORY LAPAROSCOPY  RIGHT SALPINGO OOPHORECTOMY OMENTECTOMY LYMPHADECTOMY ;  Surgeon: Everitt Amber, MD;  Location: [facility];  Service: Gynecology;  Laterality: Right;  . TUBAL LIGATION      SOCIAL HISTORY: Social History   Social History  . Marital status: Widowed    Spouse name: N/A  . Number of children: 6  . Years of education: 97   Occupational History  . Disability    Social History Main Topics  . Smoking status: Current Some Day Smoker    Years: 10.00    Types: Cigars  . Smokeless tobacco: Never Used     Comment: pack per month of cigars = 5  . Alcohol use No  . Drug use: Yes    Frequency: 5.0 times per week    Types: Marijuana     Comment: last smoked 04-01-17  . Sexual activity: No   Other Topics Concern  . Not on file   Social History Narrative   Born and raised in [place], [place]. Currently resides in a house by herself. No pets. Fun: Read, shoot pool.    Denies religious beliefs that would effect health care.    Lives at home alone.   Right-handed.   No caffeine use.   Denies abuse and feels safe at home.     FAMILY HISTORY: Family History  Problem Relation Age of Onset  . Brain cancer Sister 60  . Lung cancer Brother 31  . Heart attack Mother   . Heart attack Father        64s  . Heart attack Maternal Uncle   . Diabetes Maternal Uncle   . Breast cancer Maternal Uncle   . Ovarian cancer Paternal Aunt        3 paternal aunts with ovarian cancer  . Heart attack Brother   . Diabetes Paternal Uncle     ALLERGIES:  is allergic to other; demerol [meperidine]; morphine and related; oxycodone; penicillins;  tylenol [acetaminophen]; and ibuprofen.  MEDICATIONS:  Current Outpatient Prescriptions  Medication Sig Dispense Refill  . dicyclomine (BENTYL) 10 MG capsule Take 1 capsule (10 mg total) by mouth 4 (four) times daily -  before meals and at bedtime. 60 capsule 0  . docusate sodium 100 MG CAPS Take 100 mg by  mouth 2 (two) times daily as needed for mild constipation. 10 capsule 0  . HYDROcodone-acetaminophen (NORCO/VICODIN) 5-325 MG tablet Take 1-2 tablets by mouth every 4 (four) hours as needed for moderate pain or severe pain. 20 tablet 0  . LYRICA 200 MG capsule take 1 capsule by mouth every 8 hours AS DIRECTED BY PRESCRIBER 90 capsule 0  . Sofosbuvir-Velpatasvir (EPCLUSA) 400-100 MG TABS Take 1 tablet by mouth daily. 28 tablet 2   No current facility-administered medications for this visit.     REVIEW OF SYSTEMS:   Constitutional: Denies fevers, chills or abnormal night sweats. (+) fatigue (+) good appetite Eyes: Denies blurriness of vision, double vision or watery eyes Ears, nose, mouth, throat, and face: Denies mucositis or sore throat Respiratory: Denies cough, (+) dyspnea on exertion, no wheezes Cardiovascular: Denies palpitation, chest discomfort or lower extremity swelling Gastrointestinal:no nausea or abdominal pain, no heartburn or change in bowel habits Skin: Denies abnormal skin rashes Lymphatics: Denies new lymphadenopathy or easy bruising Neurological: (+) Peripheral neuropathy and pain on leg Behavioral/Psych: Mood is stable, no new changes  All other systems were reviewed with the patient and are negative. Breast: (+) slight leaking from biopsy site  PHYSICAL EXAMINATION: ECOG PERFORMANCE STATUS: 1  Vitals:   04/06/17 1525  BP: (!) 180/83  Pulse: 78  Resp: 18  Temp: 98.9 F (37.2 C)   Filed Weights   04/06/17 1525  Weight: 185 lb 9.6 oz (84.2 kg)    GENERAL:alert, slightly distressed due to her pain SKIN: skin color, texture, turgor are normal, no rashes or  significant lesions EYES: normal, conjunctiva are pink and non-injected, sclera clear OROPHARYNX:no exudate, no erythema and lips, buccal mucosa, and tongue normal  NECK: supple, thyroid normal size, non-tender, without nodularity LYMPH:  no palpable lymphadenopathy in the cervical, axillary or inguinal LUNGS: clear to auscultation and percussion with normal breathing effort HEART: regular rate & rhythm and no murmurs and no lower extremity edema ABDOMEN:abdomen soft, (+) tenderness at middle and left lower abdomen with gentle palpitation, no rebound pain, normal bowel sounds, no organmegaly. Musculoskeletal:no cyanosis of digits and no clubbing PSYCH: alert & oriented x 3 with fluent speech NEURO: no focal neuro deficits. EXT: No edema noted of the lower extremities. (+) Peripheral neuropathy. BREAST: (+) healing scar from breast biopsy around the right nipple. No palpable mass in both breasts and axilla  LABORATORY DATA:  I have reviewed the data as listed CBC Latest Ref Rng & Units 03/06/2017 11/06/2016 08/18/2016  WBC 3.9 - 10.3 10e3/uL 6.5 5.8 6.9  Hemoglobin 11.6 - 15.9 g/dL 15.4 15.0 14.9  Hematocrit 34.8 - 46.6 % 43.9 41.8 40.9  Platelets 145 - 400 10e3/uL 186 159 171   CMP Latest Ref Rng & Units 03/06/2017 12/31/2016 11/06/2016  Glucose 70 - 140 mg/dl 88 - 88  BUN 7.0 - 26.0 mg/dL 10.3 - 8.6  Creatinine 0.6 - 1.1 mg/dL 0.9 - 0.9  Sodium 136 - 145 mEq/L 140 - 142  Potassium 3.5 - 5.1 mEq/L 3.9 - 3.9  Chloride 101 - 111 mmol/L - - -  CO2 22 - 29 mEq/L 27 - 25  Calcium 8.4 - 10.4 mg/dL 9.7 - 9.5  Total Protein 6.4 - 8.3 g/dL 8.3 - 7.9  Total Bilirubin 0.20 - 1.20 mg/dL 0.48 - 0.72  Alkaline Phos 40 - 150 U/L 91 - 98  AST 5 - 34 U/L 15 - 19  ALT 0 - 55 U/L _0 Results for Guandique,  KAYLEEANN (MRN [id number]) as of 04/06/2017 09:47  Ref. Range 03/06/2017 14:34 03/06/2017 14:34  Cancer Antigen (CA) 125 Latest Ref Range: 0.0 - 38.1 U/mL 6.2   CEA Latest Ref Range: 0.0 - 4.7 ng/mL   2.3  CEA ([facility]) Latest Ref Range: 0.00 - 5.00 ng/mL  1.68    Surgical Pathology of Nipple Biopsy: 04/02/17 Diagnosis Nipple Biopsy, Right - BENIGN NIPPLE TISSUE. - NO ATYPIA OR MALIGNANCY. [name]  PATHOLOGY REPORT 09/18/2014 Colon, segmental resection for tumor, terminal ileum and right - ULCERATED AND INVASIVE ADENOCARCINOMA SEE COMMENT. - NEGATIVE FOR LYMPH VASCULAR INVASION - NEGATIVE FOR PERINEURAL INVASION. 1 of 4 FINAL for Khanna, Nour [phone]) Diagnosis(continued) - TUMOR EXTENDS INTO PERICOLONIC SOFT TISSUE AND IS LESS THAN 1 MM FROM THE SEROSAL SURFACE. - TWENTY-THREE LYMPH NODES, NEGATIVE FOR TUMOR (0/23) - ONE SOFT TISSUE TUMOR DEPOSIT. - SURGICAL MARGINS, NEGATIVE FOR TUMOR. Microscopic Comment COLON AND RECTUM (INCLUDING TRANS-ANAL RESECTION): Specimen: Terminal ileum and right colon. Procedure: Right colectomy. Tumor site: Cecum. Specimen integrity: Intact. Macroscopic intactness of mesorectum: N/A Macroscopic tumor perforation: Absent. Invasive tumor: Maximum size: 4.7 cm Histologic type(s): Adenocarcinoma Histologic grade and differentiation: G2: moderately differentiated/low grade Type of polyp in which invasive carcinoma arose: Tubular adenoma with high grade dysplasia Microscopic extension of invasive tumor: Tumor extends into pericolonic soft tissue, see comment. Lymph-Vascular invasion: Absent. Peri-neural invasion: Absent. Tumor deposit(s) (discontinuous extramural extension): (x1) Resection margins: Proximal margin: Negative. Distal margin: Negative. Circumferential (radial) (posterior ascending, posterior descending; lateral and posterior mid-rectum; and entire lower 1/3 rectum): Negative Mesenteric margin (sigmoid and transverse): N/A Distance closest margin (if all above margins negative): 3.3 cm (distal) Trans-anal resection margins only: Deep margin: N/A Mucosal Margin: N/A Distance closest mucosal margin (if negative):  N/A Treatment effect (neo-adjuvant therapy): None. Additional polyp(s): None. Non-neoplastic findings: None Lymph nodes: number examined- 23; number positive: 0 Pathologic Staging: pT3, pN1c, pMX, see comment. Ancillary studies: Per the [facility] gastrointestinal working group guidelines, tumor will be submitted for both mismatch repair protein expression by immunohistochemistry and microsatellite instability testing by PCR. The results will be reported in an add on. Comment: Although tumor is not directly involving the serosa (visceral peritoneum), tumor is less than 1 mm from the serosal surface and is associated with fibroinflammatory inflammation involving the serosa. Some studies consider this finding to represent serosal involvement.  Tumor MSI: STABLE   Ovary, left and cystic mass 11/01/2014 - MIXED ENDOMETRIOID AND CLEAR CELL CARCINOMA, FIGO GRADE III/III, OVARIAN PRIMARY. - FOCAL FALLOPIAN TUBE CARCINOMA IN SITU. - SEE ONCOLOGY TABLE BELOW.  Specimen(s): Left ovary and left fallopian tube. Procedure: (including lymph node sampling) Salpingo-oophorectomy. Primary tumor site (including laterality): Left ovary. Ovarian surface involvement: Present. Ovarian capsule intact without fragmentation: Disrupted. Maximum tumor size (cm): At least 3.7 cm. Histologic type: Mixed endometrioid and clear cell adenocarcinoma. Grade: III Peritoneal implants: (specify invasive or non-invasive): Not identified. Pelvic extension (list additional structures on separate lines and if involved): Not identified. Lymph nodes: number examined 0 ; number positive N/A TNM code: pT1c, pNX FIGO Stage (based on pathologic findings, needs clinical correlation): IC, at least. Comments: The carcinoma present in the current specimen is morphologically dissimilar from the tumor in the patient's previous colectomy, [phone]. The tumor cells in the current case are positive for cytokeratin 7 and estrogen  receptor. There is faint staining for p53. Cytokeratin 20 and CDX2 are negative. 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 There is a  small focus of carcinoma in situ present in fallopian tube epithelium in random tissue. Dr Claudette Laws has reviewed the case and concurs with the interpretation. The case was discussed with Dr Hulen Skains on 11/06/2014.  RADIOGRAPHIC STUDIES: I have personally reviewed the radiological images as listed and agreed with the findings in the report.   MM Right Breast 03/12/17 IMPRESSION: No mammographic or sonographic evidence of malignancy. No correlate identified for the abnormal enhancement seen within the right nipple on MRI of 02/27/2017.   CT CAP W Contrast 03/08/17 IMPRESSION: Chest Impression: 1. Stable small pulmonary nodules within LEFT lung. 2. No mediastinal lymphadenopathy. Abdomen / Pelvis Impression: 1. No evidence of metastatic colorectal cancer or ovarian cancer within the abdomen pelvis. 2. No lymphadenopathy. 3. Postsurgical change consistent RIGHT hemicolectomy and hysterectomy 4. A fem-fem arterial bypass noted.  MRI Bilateral 02/27/17 IMPRESSION: Incomplete.  MM Bilateral screening 12/24/16 IMPRESSION: No mammographic evidence of malignancy. A result letter of this screening mammogram will be mailed directly to the patient.   CT chest/abd/pelvis with contrast 08/06/2016 IMPRESSION: 1. No recurrent malignancy identified. 2. 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 On the prior exam from 09/13/2015 there was back flow of contrast in the left external iliac artery to supply the left internal iliac artery ; this back flow of contrast is no longer present in the left external iliac artery is occluded. 3. Several pulmonary nodules at or below 4 mm diameter, unchanged. 4. 3 mm  hypodensity posteriorly in the body of the pancreas, not well seen previously and subtle today, likely incidental, but merits observation. 5. 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 Prominent degenerative spurring of both acetabula. 6. Lumbar spondylosis and degenerative disc disease causing prominent impingement at L4-5 and mild impingement at L5-S1.  Ct chest, Abdomen Pelvis W Contrast 09/13/2015  IMPRESSION: 1. No evidence of metastatic disease in the chest, abdomen or pelvis. 2. Stable postsurgical changes status post right hemicolectomy and TAH-BSO, with no evidence of local tumor recurrence. 3. Patent femoral-femoral arterial bypass graft.   ASSESSMENT & PLAN:  56 y.o.  female with past medical history of arthritis, hysterectomy, history of abdominal bleeding status post embolization in 2005, who presented with abdominal pain nausea vomiting and weight loss. CT of abdomen showed a 3.8cm cecal mass with associated ileocecal intussusception, and a 9.1cm solid and cystic mass in the left pelvis,which was found to be a ovarian cancer. She is status post right hemicolectomy and terminal ileoectomy on 09/18/2014, and left Salpingo-oophorectomy on 11/01/14.   1. Right colon cancer, PT3pN1M0, stage III, MSI-stable  -I previously reviewed her CT scan and the surgical pathology findings with her in details. Based on her surgical pathology, she has stage III disease.  -Her tumor microsatellite stability test showed stable, she unlikely has Lynch syndrome. -Althrough I previously recommended adjuvant chemotherapy, which is standard care for stage III colon cancer, her chemotherapy was postponed due to her multiple surgery, significance of pain after surgeries, and compliance issue (multiple no show), she was 5 months out of initial colon surgery when she returned for follow up after surgery, and the benefit of adjuvant chemotherapy was minimal at that point. So she did not receive  adjuvant chemotherapy --We previously discussed the surveillance CT scan from 08/06/16. No recurrent malignancy identified. -I reviewed her surveillance CT Scan 03/08/17  which showed no evidence of recurrence. -She is clinically doing well, lab results reviewed with her,  exam was unremarkable today. No clinical concern for recurrence. - we discussed keeping up with regular f/u for colon, ovarian and  breast cancer surveillance   2. Ovary cancer, left, pT1 cNX M0, at least stage I, mixed endometrioid and clear cell histology, grade 3, ER positive. - She underwent right right oophorectomy and pelvic lymph nodes biopsy for staging, which wall over negative. -She is likely need adjuvant chemotherapy, the standard therapy is carboplatin and paclitaxel for overan cancer. She is close to 3 months out of her initial ovary surgery, and she developed left lower extremity neuropathy after her recent surgery, not a great candidate for chemotherapy. I previously discussed with Dr. Denman George about her adjuvant adjuvant chemotherapy. Dr. Denman George felt her colon cancer is at much high risk for recurrence than ovarian cancer, and she is also quite far our from her initial ovaran cancer surgery, the benefit of chemotherapy for ovarian cancer is very limited. Due to her limited PS and pain issue, she is also not a good candidate for chemo.  -We'll follow up CA125,  Which has been normal lately  -she will follow up with Dr. Alycia Rossetti now, last visit in 11/2016  3. BRCA1 mutation carrier -I previously discussed her recent genetic testing results which revealed positive BRCA1 mutation. -I previously discussed extensively about the risk of breast cancer (57-65% by age of 42), and I recommend prophylactic bilateral mastectomy, but she declined. She agrees to have annual screening mammogram -Prophylactic bilateral mastectomy were discussed again today, she declined. -Her recent breast biopsy was negative. -continue annual screening  breast mammogram and MRI, she agrees. -She has 5 children, all were tested, only her old son carries BRCA1 mutation  -We previously discussed chemoprevention Tamoxifen to reduce the risk of breast cancer in the future. We previously discussed the risks and benefits. The patient has declined.  4. abdominal and left lower extremity neuropathy   -Likely related to her peripheral vascular disease and a neuropathy of the surgery. -Much improved lately, she is on Lyrica, but does not feel it helps. -Still has shooting pain through leg down to her toes. She is taking Lyrica for this.   4. PVD -she willl follow up with Dr. Eden Lathe   5. Coping and social support, history of depression and PTSD -She has had some difficulty coping with her synchronized colon and ovarian cancer. She lives alone, does not want her children to be involved much in her cancer care.  -her mood has much improved over time, she is coping much better lately.  6. HTN -The patient is being followed by her PCP, but the patient stopped about 2 days ago. -I previously advised the patient to speak with her PCP regarding this and the importance of bringing her blood pressure down.  Plan -f/u in 6 months with lab   All questions were answered. The patient knows to call the clinic with any problems, questions or concerns. I spent 25 minutes counseling the patient face to face. The total time spent in the appointment was 30 minutes and more than 50% was on counseling.     Truitt Merle, MD 04/06/2017   This document serves as a record of services personally performed by Truitt Merle, MD. It was created on her behalf by Joslyn Devon, a trained medical scribe. The creation of this record is based on the scribe's personal observations and the provider's statements to them. This document has been checked and approved by the attending provider.

## 2017-04-07 ENCOUNTER — Encounter: Payer: Self-pay | Admitting: Hematology

## 2017-04-15 ENCOUNTER — Encounter: Payer: Self-pay | Admitting: Family

## 2017-04-22 ENCOUNTER — Other Ambulatory Visit: Payer: Self-pay | Admitting: Family

## 2017-04-23 NOTE — Telephone Encounter (Signed)
Faxed script back to [facility]...[name]

## 2017-04-24 ENCOUNTER — Ambulatory Visit (HOSPITAL_COMMUNITY)
Admission: RE | Admit: 2017-04-24 | Discharge: 2017-04-24 | Disposition: A | Discharge status: Home / Self Care | Payer: Medicare Other | Source: Ambulatory Visit | Attending: Family | Admitting: Family

## 2017-04-24 ENCOUNTER — Ambulatory Visit: Payer: Medicare Other | Admitting: Family

## 2017-04-24 ENCOUNTER — Encounter: Payer: Self-pay | Admitting: Family

## 2017-04-24 ENCOUNTER — Ambulatory Visit (INDEPENDENT_AMBULATORY_CARE_PROVIDER_SITE_OTHER): Payer: Medicare Other | Admitting: Family

## 2017-04-24 VITALS — BP 146/94 | HR 68 | Resp 18 | Ht 67.0 in | Wt 184.1 lb

## 2017-04-24 DIAGNOSIS — Z95828 Presence of other vascular implants and grafts: Secondary | ICD-10-CM

## 2017-04-24 DIAGNOSIS — I779 Disorder of arteries and arterioles, unspecified: Secondary | ICD-10-CM

## 2017-04-24 DIAGNOSIS — G8928 Other chronic postprocedural pain: Secondary | ICD-10-CM | POA: Diagnosis not present

## 2017-04-24 DIAGNOSIS — I739 Peripheral vascular disease, unspecified: Secondary | ICD-10-CM | POA: Diagnosis not present

## 2017-04-24 NOTE — Patient Instructions (Signed)

## 2017-04-24 NOTE — Progress Notes (Signed)
[facility] [place]   CC: Follow up peripheral artery occlusive disease  History of Present Illness Catherine Rogers is a 56 y.o. female patient of Dr. Oneida Alar who is s/p right to left femoral-femoral bypass on 04/18/2015. This was done primarily for rest pain in the left foot. She continues to have pain in the left foot despite revascularization. She also still has some edema in the left foot. She states that the foot is 70% better than preoperatively. Her incisions are well-healed. Her claudication in the left leg has completely resolved. Left foot x-ray showed no evidence of fracture but demineralization possibly reflex sympathetic dystrophy (RSD).  She has weaned off narcotic pain medication for her left foot. She states that her foot hurts day and night and feels cool, Lyrica is helping this. She has also noticed changes in the shape of her foot due to changes in her gait secondary to pain.  Dr. Oneida Alar last saw pt on 09/13/15. At that time patient had a duplex ultrasound 1 month prior of her femorofemoral bypass which suggested possible inflow narrowing of the femorofemoral. She had a CT angiogram of the abdomen and pelvis with lower extremity runoff that day. This showed a widely patent femoral-femoral bypass with completely intact runoff to the left lower extremity without any narrowing of her lower extremity arteries. She did have mild narrowing of the right superficial femoral artery. Persistent left foot pain despite revascularization. Recent bone scan suggested reflux sympathetic dystrophy. I do not believe all of her current pain symptoms are related to vascular disease. She has previously had a neurologic evaluation which suggests that most of this pain probably is neurologic in origin from her previous extensive operation for ovarian and colon cancer. She had an extensive workup by neurology in May of 2016. She has had a referral to the pain clinic in May 2016, August  2016 and from our office in October 2016. Dr. Oneida Alar called the pain Center in October 2016 at [phone] and spoke with their manager. Dr. Oneida Alar did not prescribe any narcotic pain medication at the October 2016 visit as he does not believe her pain symptoms were related to peripheral arterial disease. She was to follow-up with [place] in 6 months for repeat ABIs.   She returns today for 6 months follow up. Pt states she did have an appointment with the pain clinic, states she did not want to take any prescription pain medications and is not attending any longer.  She takes Lyrica, occasional Extra Strength Tylenol.  She states she was dehydrated when she presented for right breast biopsy on 04-02-17, states she needed IV rehydration, and her left side felt better after this.  She states she drinks about 85 oz of water daily and voids a great deal. Her last CMP in April 2018 was all WNL.   She reports tingling from her left toe to the left side of her brain for the last several months, getting worse; states she will make an appointment with her neurologist to discuss this.   She states she has nerve damage since the ovarian cancer surgery, has left leg and abdominal pain since then.    Pt has multiple complaints, but states she is happy and glad she is doing as well as she is. She exercises her legs daily as prescribed by physical therapy. She walks almost a mile daily on her treadmill.  She is diligent about her diet.    Pt Diabetic: No Pt smoker: states she  never smoked tobacco, currently smokes marijuana which helps with the abdominal and left leg pain and helps her sleep.  Pt meds include: Statin :No Betablocker: No ASA: No Other anticoagulants/antiplatelets: no    Past Medical History:  Diagnosis Date  . Anemia   . Anxiety   . Arthritis   . Asthma   . Cancer ([place])    colon and ovarian  . Complication of anesthesia    woke and saw long tube coming out of mouth . sat up  then went back to sleep  . Depression   . Ectopic pregnancy   . Family history of adverse reaction to anesthesia    sister on dialysis- hypotension   . Family history of ovarian cancer   . Head injury, closed, with brief LOC ([place])    had a cut- as a child  . Headache   . Hepatitis    being treated for Hep C  . History of blood transfusion    2005 approximately   . Hypertension    no meds now  . PAD (peripheral artery disease) ([place]) 04/18/2015  . Pelvic cyst   . PONV (postoperative nausea and vomiting)   . PTSD (post-traumatic stress disorder)     Social History Social History  Substance Use Topics  . Smoking status: Current Some Day Smoker    Years: 10.00    Types: Cigars  . Smokeless tobacco: Never Used     Comment: pack per month of cigars = 5  . Alcohol use No    Family History Family History  Problem Relation Age of Onset  . Brain cancer Sister 75  . Lung cancer Brother 87  . Heart attack Mother   . Heart attack Father        60s  . Heart attack Maternal Uncle   . Diabetes Maternal Uncle   . Breast cancer Maternal Uncle   . Ovarian cancer Paternal Aunt        3 paternal aunts with ovarian cancer  . Heart attack Brother   . Diabetes Paternal Uncle     Past Surgical History:  Procedure Laterality Date  . ABDOMINAL HYSTERECTOMY    . BREAST BIOPSY Right 04/02/2017   Procedure: INCISIONAL BIOPSY RIGHT NIPPLE;  Surgeon: Coralie Keens, MD;  Location: [facility];  Service: General;  Laterality: Right;  . COLONOSCOPY    . ESOPHAGOGASTRODUODENOSCOPY N/A 09/17/2014   Procedure: ESOPHAGOGASTRODUODENOSCOPY (EGD);  Surgeon: Jeryl Columbia, MD;  Location[facility] END[facility]COPY;  Service: Endoscopy;  Laterality: N/A;  . FEMORAL-FEMORAL BYPASS GRAFT Bilateral 04/18/2015   Procedure:  RIGHT COMMON FEMORAL TO LEFT COMMON  FEMORAL  ARTERY BYPASS WITH  8 MM HEMASHIELD GRAFT,RIGHT COMMON SUPERFICIAL FEMORAL ARTERY ENDARTERECTOMY;  Surgeon: Elam Dutch, MD;  Location:  [facility];  Service: Vascular;  Laterality: Bilateral;  . ILEOSTOMY Right 09/18/2014   Procedure: ILEOCOLOSTOMY;  Surgeon: Doreen Salvage, MD;  Location: [facility];  Service: General;  Laterality: Right;  . LAPAROTOMY N/A 11/01/2014   Procedure: EXPLORATORY LAPAROTOMY;  Surgeon: Doreen Salvage, MD;  Location: [facility];  Service: General;  Laterality: N/A;  . LAPAROTOMY N/A 12/26/2014   Procedure:  LAPAROTOMY ;  Surgeon: Everitt Amber, MD;  Location: [facility];  Service: Gynecology;  Laterality: N/A;  . MASS EXCISION  11/01/2014   Procedure: EXCISION PELVIC MASS;  Surgeon: Doreen Salvage, MD;  Location: [facility];  Service: General;;  . OOPHORECTOMY  2016  . PARTIAL COLECTOMY Right 09/18/2014   Procedure: RIGHT HEMI COLECTOMY;  Surgeon: Doreen Salvage, MD;  Location: [facility];  Service: General;  Laterality: Right;  . ROBOTIC ASSISTED TOTAL HYSTERECTOMY WITH BILATERAL SALPINGO OOPHERECTOMY Right 12/26/2014   Procedure: EXPLORATORY LAPAROSCOPY  RIGHT SALPINGO OOPHORECTOMY OMENTECTOMY LYMPHADECTOMY ;  Surgeon: Everitt Amber, MD;  Location: [facility];  Service: Gynecology;  Laterality: Right;  . TUBAL LIGATION      Allergies  Allergen Reactions  . Other Hives and Itching    Highly acidic foods "make me itch and break out" oranges, tomatoes  . Demerol [Meperidine] Nausea And Vomiting  . Morphine And Related Nausea And Vomiting  . Oxycodone Itching  . Penicillins Other (See Comments)    Childhood allergy  . Tylenol [Acetaminophen] Nausea And Vomiting  . Ibuprofen Nausea And Vomiting    Current Outpatient Prescriptions  Medication Sig Dispense Refill  . docusate sodium 100 MG CAPS Take 100 mg by mouth 2 (two) times daily as needed for mild constipation. 10 capsule 0  . LYRICA 200 MG capsule take 1 capsule by mouth every 8 hours as directed BY PRESCRIBER 90 capsule 0  . acetaminophen (TYLENOL) 325 MG tablet Take 325 mg by mouth as needed.     No current facility-administered medications for this visit.     ROS: See HPI for pertinent  positives and negatives.   Physical Examination  Vitals:   04/24/17 1419 04/24/17 1425  BP: (!) 144/87 (!) 146/94  Pulse: 66 68  Resp: 18   SpO2: 99%   Weight: 184 lb 1 oz (83.5 kg)   Height: 5\' 7"  (1.702 m)    Body mass index is 28.83 kg/m.  General: A&O x 3, WDWN. Gait: normal Eyes: PERRLA. Pulmonary: Respirations are non labored, CTAB, without wheezes, rales, or rhonchi. Cardiac: regular rhythm and rate, no detected murmur.    Carotid Bruits Right Left   Negative Negative  Aorta is not palpable. Radial pulses: 1+ palpable and =  Palpable fem-fem bypass graft pulse   VASCULAR EXAM: Extremitieswithoutischemic changes, withoutGangrene; withoutopen wounds.  LE Pulses Right Left  FEMORAL not palpable not palpable, tender to palpation  POPLITEAL not palpable not palpable  POSTERIOR TIBIAL not palpable not palpable  DORSALIS PEDIS ANTERIOR TIBIAL 1+ palpable 1+ palpable    Abdomen: soft, NT, no palpable masses. Skin: no rashes, no ulcers. Musculoskeletal: no muscle wasting or atrophy other than mild deformity of left foot. Neurologic: A&O X 3; Appropriate Affect ; SENSATION: dysesthesia left lower extremity; MOTOR FUNCTION: moving all extremities equally, motor strength 5/5 throughout. Speech is fluent/normal. CN 2-12 intact.    Non-Invasive Vascular Imaging: DATE: 04/24/2017  ABI:  R:   ABI: 0.66 (0.84 on 10-23-16),   PT: mono  DP: bi  TBI:  0.56 (was 0.76)  L:   ABI: 0.75 (was 0.76),   PT: mono  DP: bi  TBI: 0.59 (was 0.57)   ASSESSMENT: Catherine Rogers is a 56 y.o. female who is s/p right to left femoral-femoral bypass on 04/18/2015. This was done primarily for rest pain in the left foot. She continues to have pain in the left foot despite revascularization. She states that the foot is 70% better than preoperatively.  Her claudication in the left leg has  completely resolved.  Pt reports that she has been diligent about exercising daily as prescribed by physical therapy. She is able to walk a block without stopping; this has improved from a half block. She has weaned herself off narcotics and takes Lyrica with some degree of pain relief of the left leg  and abdomen.   Right LE ABI has declined to 0.84 from 0.66, waveforms remain mono and biphasic. Left ABI remains stable; bilateral ABI indicate moderate arterial occlusive disease.  Bilateral DP pulses remain 1+ palpable.   PLAN:  Based on the patient's vascular studies and examination, pt will return to clinic in 6 months with ABI's. I advised her to return sooner if she develops concerns re the circulation in her feet/legs.   Continue graduated walking program and daily leg exercises.  I discussed in depth with the patient the nature of atherosclerosis, and emphasized the importance of maximal medical management including strict control of blood pressure, blood glucose, and lipid levels, obtaining regular exercise, and continued cessation of smoking.  The patient is aware that without maximal medical management the underlying atherosclerotic disease process will progress, limiting the benefit of any interventions.  The patient was given information about PAD including signs, symptoms, treatment, what symptoms should prompt the patient to seek immediate medical care, and risk reduction measures to take.  Clemon Chambers, RN, MSN, FNP-C [facility] Phone: [phone]  Clinic MD: Chen/Cain  04/24/17 2:34 PM

## 2017-04-29 ENCOUNTER — Other Ambulatory Visit: Payer: Medicare Other

## 2017-04-29 DIAGNOSIS — B182 Chronic viral hepatitis C: Secondary | ICD-10-CM | POA: Diagnosis not present

## 2017-05-01 LAB — HEPATITIS C RNA QUANTITATIVE
HCV Quantitative Log: <1.18 Log IU/mL
HCV Quantitative: <15 IU/mL

## 2017-05-01 NOTE — Addendum Note (Signed)
Addended by: Lianne Cure A on: 05/01/2017 03:17 PM   Modules accepted: Orders

## 2017-05-20 ENCOUNTER — Ambulatory Visit (HOSPITAL_BASED_OUTPATIENT_CLINIC_OR_DEPARTMENT_OTHER): Payer: Medicare Other

## 2017-05-20 ENCOUNTER — Encounter: Payer: Self-pay | Admitting: Gynecologic Oncology

## 2017-05-20 ENCOUNTER — Ambulatory Visit: Payer: Medicare Other | Attending: Gynecologic Oncology | Admitting: Gynecologic Oncology

## 2017-05-20 VITALS — BP 137/86 | HR 70 | Temp 98.0°F | Resp 18 | Wt 184.4 lb

## 2017-05-20 DIAGNOSIS — Z85038 Personal history of other malignant neoplasm of large intestine: Secondary | ICD-10-CM | POA: Diagnosis not present

## 2017-05-20 DIAGNOSIS — R918 Other nonspecific abnormal finding of lung field: Secondary | ICD-10-CM

## 2017-05-20 DIAGNOSIS — Z1501 Genetic susceptibility to malignant neoplasm of breast: Secondary | ICD-10-CM | POA: Diagnosis not present

## 2017-05-20 DIAGNOSIS — C541 Malignant neoplasm of endometrium: Secondary | ICD-10-CM | POA: Diagnosis not present

## 2017-05-20 DIAGNOSIS — K5900 Constipation, unspecified: Secondary | ICD-10-CM | POA: Insufficient documentation

## 2017-05-20 DIAGNOSIS — Z8543 Personal history of malignant neoplasm of ovary: Secondary | ICD-10-CM

## 2017-05-20 DIAGNOSIS — C562 Malignant neoplasm of left ovary: Secondary | ICD-10-CM

## 2017-05-20 DIAGNOSIS — C182 Malignant neoplasm of ascending colon: Secondary | ICD-10-CM

## 2017-05-20 DIAGNOSIS — Z720 Tobacco use: Secondary | ICD-10-CM | POA: Diagnosis not present

## 2017-05-20 DIAGNOSIS — C569 Malignant neoplasm of unspecified ovary: Secondary | ICD-10-CM

## 2017-05-20 DIAGNOSIS — Z1509 Genetic susceptibility to other malignant neoplasm: Secondary | ICD-10-CM

## 2017-05-20 LAB — COMPREHENSIVE METABOLIC PANEL
ALBUMIN: 4 g/dL (ref 3.5–5.0)
ALK PHOS: 92 U/L (ref 40–150)
ALT: 8 U/L (ref 0–55)
AST: 16 U/L (ref 5–34)
Anion Gap: 9 mEq/L (ref 3–11)
BILIRUBIN TOTAL: 0.44 mg/dL (ref 0.20–1.20)
BUN: 12.2 mg/dL (ref 7.0–26.0)
CO2: 26 meq/L (ref 22–29)
Calcium: 9.7 mg/dL (ref 8.4–10.4)
Chloride: 106 mEq/L (ref 98–109)
Creatinine: 1 mg/dL (ref 0.6–1.1)
EGFR: 76 mL/min/{1.73_m2} — AB (ref >90)
GLUCOSE: 80 mg/dL (ref 70–140)
Potassium: 3.8 mEq/L (ref 3.5–5.1)
SODIUM: 140 meq/L (ref 136–145)
TOTAL PROTEIN: 7.9 g/dL (ref 6.4–8.3)

## 2017-05-20 LAB — CBC WITH DIFFERENTIAL/PLATELET
BASO%: 0.5 % (ref 0.0–2.0)
Basophils Absolute: 0 10*3/uL (ref 0.0–0.1)
EOS ABS: 0.1 10*3/uL (ref 0.0–0.5)
EOS%: 0.9 % (ref 0.0–7.0)
HCT: 43.9 % (ref 34.8–46.6)
HEMOGLOBIN: 15.4 g/dL (ref 11.6–15.9)
LYMPH%: 50.5 % — AB (ref 14.0–49.7)
MCH: 35.4 pg — ABNORMAL HIGH (ref 25.1–34.0)
MCHC: 35 g/dL (ref 31.5–36.0)
MCV: 101.3 fL — ABNORMAL HIGH (ref 79.5–101.0)
MONO#: 0.4 10*3/uL (ref 0.1–0.9)
MONO%: 5.5 % (ref 0.0–14.0)
NEUT%: 42.6 % (ref 38.4–76.8)
NEUTROS ABS: 2.8 10*3/uL (ref 1.5–6.5)
Platelets: 181 10*3/uL (ref 145–400)
RBC: 4.34 10*6/uL (ref 3.70–5.45)
RDW: 13.1 % (ref 11.2–14.5)
WBC: 6.6 10*3/uL (ref 3.9–10.3)
lymph#: 3.3 10*3/uL (ref 0.9–3.3)

## 2017-05-20 NOTE — Progress Notes (Signed)
Gyn Oncology Return Patient Visit  HPI:  Catherine Rogers is a 56 y.o. 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  She then underwent a exploratory laparotomy right salpingo-oophorectomy, omentectomy, pelvic and para-aortic lymph node dissection, biopsies on 12/26/14 with Dr. Denman George  Her final pathology revealed no cancer residual in the remaining specimens confirming a stage I ovarian cancer. She had a complicated history with both a T3 colon cancer and a stage IC ovarian cancer. 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   She did not receive any adjuvant chemotherapy for her ovarian cancer and is under the care of Dr. Burr Medico. She had imaging in September 2017 that revealed: IMPRESSION: 1. No recurrent malignancy identified. 2. 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 On the prior exam from 09/13/2015 there was back flow of contrast in the left external iliac artery to supply the left internal iliac artery ; this back flow of contrast is no longer present in the left externaliliac artery is occluded. 3. Several pulmonary nodules at or below 4 mm diameter, unchanged. 4. 3 mm hypodensity posteriorly in the body of the pancreas, not well seen previously and subtle today, likely incidental, but merits observation. 5. 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 Prominent degenerative spurring of both acetabula. 6. Lumbar spondylosis and degenerative disc disease causing prominent impingement at L4-5 and mild impingement at L5-S1.  She had tumor markers in December 2014 that showed a CA-125 of 6.7 and a CEA of 2.1.  Interval History: She had imaging in 4/18 that revealed: IMPRESSION: Chest Impression:  1. Stable small pulmonary nodules within LEFT lung. 2. No mediastinal lymphadenopathy.  Abdomen / Pelvis  Impression:  1. No evidence of metastatic colorectal cancer or ovarian cancer within the abdomen pelvis. 2. No lymphadenopathy. 3. Postsurgical change consistent RIGHT hemicolectomy and hysterectomy 4. A fem-fem arterial bypass noted.  She also had tumor markers that revealed a CEA of 2.3 and a CA-125 of 6.4 in April 2018.  She continues to refuse mastectomies. She had a biopsy of her nipple that was abnormal on imaging and fortunately the pathology was normal and negative for malignancy.  She's overall doing fairly well. She stay she's feeling fairly light headed today. She did take a Tylenol No. 3 secondary to fairly significant oral discomfort. She states she has tablet most of her teeth removed and it happened partials placed. She has follow-up with her oral surgeon on July 3. She states that her surgery will be so extensive that the putting her to sleep. As a result of her tooth pain in needing to take the pain medication she hasn't feeling more dizzy and lightheaded. She's also developed a rash that she thinks is related to the narcotics that she has a sensitivity to them but she can avoid taking them secondary to pain. She denies any abdominal or pelvic pain. She does complain of a caught occasional constipation. She is taking a stool softener every other day. The constipation seems to happen on those days that she did not take her stool softener. She was subsequently take the stool softener her again, have a bowel movement and her constipation resolves. We discussed taking her stool softener every day. She occasionally gets short of breath when she has constipation and she feels like she is choking when she can have a bowel  movement. With the constipation she'll occasionally also have some nausea. She otherwise has no other complaints. She continues to smoke 1 cigar every day. She does this in response to stress. I discussed with her that this probably is not helping the issues with her teeth  and oral health and that she most likely needs to stop smoking.  Review of systems: Constitutional:  She has no weight gain or weight loss. She has no fever or chills. Eyes: No vision changes, wears reading glasses. Ears, Nose, Mouth, Throat: + dizziness secondary to pain medication, headaches or changes in hearing. No mouth sores. + Tooth pain secondary to caries Cardiovascular: No chest pain, palpitations or edema. Respiratory:  Shortness of breath when she is feeling constipated Gastrointestinal: Occasional constipation on the day she does not take her stool softener. The constipation improves this and she takes it. With this constipation she feels like she is choking will have some nausea. Otherwise she denies any nausea or vomiting. Abdominal pain as above. Genitourinary:  She has no irregular vaginal bleeding or vaginal discharge Psychiatric:  She denies depression or anxiety.  Physical Exam: Blood pressure 137/86, pulse 70, temperature 98 F (36.7 C), resp. rate 18, weight 184 lb 6.4 oz (83.6 kg), SpO2 99 %. General: Well dressed, well nourished in no apparent distress.    HEENT:  Normocephalic and atraumatic, no lesions. Thyroid is normal size, not nodular, midline.  Lungs:  Clear to auscultation bilaterally.  No wheezes.  Cardiovascular:  Regular rate and rhythm.    Abdomen:  Soft, nontender, nondistended.  No palpable masses.  No hepatosplenomegaly.  No ascites. Normal bowel sounds.  No hernias.  Incision is well-healed. Tenderness at the umbilicus.  Genitourinary: External genitalia within normal limits. Bimanual examination reveals no masses or nodularity. Rectal confirms.  Extremities: No edema.   Assessment:    56 y.o. 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   S/p ex lap, RSO, lymphadenectomy, omentectomy on 12/26/14. She did not receive any adjuvant therapy for either of her malignancies. Of note, she was recently tested in April 2017 and  was found to be BRCA1 positive.  Plan  She will follow-up with Dr. Burr Medico as scheduled for follow-up of her colon cancer.  She had a breast MRI in April. She had mammogram in January with repeat in May at the time of the biopsy. She'll need to continue alternating her MRIs and mammograms every 6 months. She states that she has disease exam follow-up scheduled though I cannot find the dates.  She will follow-up with her other physicians as scheduled.  We will draw her tumor markers today including a CA-125 and CEA she does not have another blood draw visit until September and sees Dr. Burr Medico in November. She'll return to see [place] in 6 months we will notify her of the results. We wished her good luck on her upcoming surgery.  Smoking cessation was encouraged.   [name] A., MD

## 2017-05-20 NOTE — Patient Instructions (Signed)
Please return to see [place] in 6 months and we will notify you of your results from today. Good luck with your upcoming oral surgery.

## 2017-05-21 LAB — CA 125: Cancer Antigen (CA) 125: 6.8 U/mL (ref 0.0–38.1)

## 2017-05-21 LAB — CEA (IN HOUSE-CHCC): CEA (CHCC-In House): 1.47 ng/mL (ref 0.00–5.00)

## 2017-05-22 ENCOUNTER — Other Ambulatory Visit: Payer: Self-pay | Admitting: Internal Medicine

## 2017-05-25 ENCOUNTER — Other Ambulatory Visit: Payer: Self-pay | Admitting: Family

## 2017-05-25 NOTE — Telephone Encounter (Signed)
Refill has already been faxed to [facility] this morning...[name]

## 2017-05-25 NOTE — Telephone Encounter (Signed)
Faxed script back to [facility]...lmb 

## 2017-05-26 ENCOUNTER — Telehealth: Payer: Self-pay | Admitting: Gynecologic Oncology

## 2017-05-26 NOTE — Telephone Encounter (Signed)
Left message for patient about recent lab work.  Advised to call for any needs or concerns.

## 2017-06-02 ENCOUNTER — Telehealth: Payer: Self-pay | Admitting: Gynecologic Oncology

## 2017-06-02 NOTE — Telephone Encounter (Signed)
Returned call to patient.  Informed patient of lab results.  No concerns voiced.

## 2017-06-22 ENCOUNTER — Other Ambulatory Visit: Payer: Self-pay | Admitting: Family

## 2017-06-22 NOTE — Telephone Encounter (Signed)
Last refill was 05/26/2017 per Jardine CS DB

## 2017-06-22 NOTE — Telephone Encounter (Signed)
Faxed

## 2017-07-28 ENCOUNTER — Other Ambulatory Visit: Payer: Self-pay | Admitting: Family

## 2017-07-28 NOTE — Telephone Encounter (Signed)
Patient has called in regard to refill on lyrica.  I have scheduled patient for 10/15 for one year follow up.

## 2017-07-29 NOTE — Telephone Encounter (Signed)
Pt called back checking on this medication. She has been out for the past three days.

## 2017-07-30 ENCOUNTER — Other Ambulatory Visit: Payer: Medicare Other

## 2017-07-30 NOTE — Telephone Encounter (Signed)
Rx faxed

## 2017-08-06 ENCOUNTER — Ambulatory Visit: Payer: Medicare Other

## 2017-08-28 ENCOUNTER — Other Ambulatory Visit: Payer: Self-pay | Admitting: Family

## 2017-09-04 ENCOUNTER — Other Ambulatory Visit: Payer: Self-pay | Admitting: Family

## 2017-09-04 MED ORDER — PREGABALIN 200 MG PO CAPS: ORAL_CAPSULE | ORAL | 0 refills | Status: DC

## 2017-09-07 ENCOUNTER — Ambulatory Visit: Payer: Medicare Other | Admitting: Family

## 2017-09-17 ENCOUNTER — Ambulatory Visit (INDEPENDENT_AMBULATORY_CARE_PROVIDER_SITE_OTHER): Payer: Medicare Other | Admitting: Nurse Practitioner

## 2017-09-17 ENCOUNTER — Encounter: Payer: Self-pay | Admitting: Nurse Practitioner

## 2017-09-17 VITALS — BP 142/92 | HR 71 | Temp 99.2°F | Resp 16 | Ht 67.0 in | Wt 189.8 lb

## 2017-09-17 DIAGNOSIS — I779 Disorder of arteries and arterioles, unspecified: Secondary | ICD-10-CM

## 2017-09-17 DIAGNOSIS — R110 Nausea: Secondary | ICD-10-CM

## 2017-09-17 DIAGNOSIS — I10 Essential (primary) hypertension: Secondary | ICD-10-CM

## 2017-09-17 DIAGNOSIS — G629 Polyneuropathy, unspecified: Secondary | ICD-10-CM

## 2017-09-17 DIAGNOSIS — G609 Hereditary and idiopathic neuropathy, unspecified: Secondary | ICD-10-CM | POA: Insufficient documentation

## 2017-09-17 MED ORDER — AMLODIPINE BESYLATE 2.5 MG PO TABS: 2.5000 mg | ORAL_TABLET | Freq: Every day | ORAL | 1 refills | Status: DC

## 2017-09-17 MED ORDER — NORTRIPTYLINE HCL 10 MG PO CAPS: 10.0000 mg | ORAL_CAPSULE | Freq: Every day | ORAL | 3 refills | Status: DC

## 2017-09-17 MED ORDER — ONDANSETRON HCL 4 MG PO TABS: 4.0000 mg | ORAL_TABLET | Freq: Three times a day (TID) | ORAL | 0 refills | Status: DC | PRN

## 2017-09-17 NOTE — Patient Instructions (Addendum)
I have sent a new prescription for amlodipine 2.5mg  for your blood pressure. Take 1 tablet daily by mouth for blood pressure. If you can, check your blood pressure daily or at least 2-3 times a week and keep a log. We need to get your blood pressure under 140/90.  I have sent a prescription for zofran for nausea. If this does not help your nausea please let me know.  I have sent a new prescription for nortriptyline 10mg  for your nerve pain. Take once capsule at bedtime daily. If you experience any thoughts of hurting yourself on this medication, go immediately to your nearest emergency room.  Id like to see you back next month for a blood pressure recheck and see how your leg pian is doing.  It was nice to meet you. Thanks for letting me take care of you today :)    DASH Eating Plan DASH stands for "Dietary Approaches to Stop Hypertension." The DASH eating plan is a healthy eating plan that has been shown to reduce high blood pressure (hypertension). It may also reduce your risk for type 2 diabetes, heart disease, and stroke. The DASH eating plan may also help with weight loss. What are tips for following this plan? General guidelines  Avoid eating more than 2,300 mg (milligrams) of salt (sodium) a day. If you have hypertension, you may need to reduce your sodium intake to 1,500 mg a day.  Limit alcohol intake to no more than 1 drink a day for nonpregnant women and 2 drinks a day for men. One drink equals 12 oz of beer, 5 oz of wine, or 1 oz of hard liquor.  Work with your health care provider to maintain a healthy body weight or to lose weight. Ask what an ideal weight is for you.  Get at least 30 minutes of exercise that causes your heart to beat faster (aerobic exercise) most days of the week. Activities may include walking, swimming, or biking.  Work with your health care provider or diet and nutrition specialist (dietitian) to adjust your eating plan to your individual calorie  needs. Reading food labels  Check food labels for the amount of sodium per serving. Choose foods with less than 5 percent of the Daily Value of sodium. Generally, foods with less than 300 mg of sodium per serving fit into this eating plan.  To find whole grains, look for the word "whole" as the first word in the ingredient list. Shopping  Buy products labeled as "low-sodium" or "no salt added."  Buy fresh foods. Avoid canned foods and premade or frozen meals. Cooking  Avoid adding salt when cooking. Use salt-free seasonings or herbs instead of table salt or sea salt. Check with your health care provider or pharmacist before using salt substitutes.  Do not fry foods. Cook foods using healthy methods such as baking, boiling, grilling, and broiling instead.  Cook with heart-healthy oils, such as olive, canola, soybean, or sunflower oil. Meal planning   Eat a balanced diet that includes: ? 5 or more servings of fruits and vegetables each day. At each meal, try to fill half of your plate with fruits and vegetables. ? Up to 6-8 servings of whole grains each day. ? Less than 6 oz of lean meat, poultry, or fish each day. A 3-oz serving of meat is about the same size as a deck of cards. One egg equals 1 oz. ? 2 servings of low-fat dairy each day. ? A serving of nuts, seeds, or  beans 5 times each week. ? Heart-healthy fats. Healthy fats called Omega-3 fatty acids are found in foods such as flaxseeds and coldwater fish, like sardines, salmon, and mackerel.  Limit how much you eat of the following: ? Canned or prepackaged foods. ? Food that is high in trans fat, such as fried foods. ? Food that is high in saturated fat, such as fatty meat. ? Sweets, desserts, sugary drinks, and other foods with added sugar. ? Full-fat dairy products.  Do not salt foods before eating.  Try to eat at least 2 vegetarian meals each week.  Eat more home-cooked food and less restaurant, buffet, and fast  food.  When eating at a restaurant, ask that your food be prepared with less salt or no salt, if possible. What foods are recommended? The items listed may not be a complete list. Talk with your dietitian about what dietary choices are best for you. Grains Whole-grain or whole-wheat bread. Whole-grain or whole-wheat pasta. Brown rice. [place]. Bulgur. Whole-grain and low-sodium cereals. Pita bread. Low-fat, low-sodium crackers. Whole-wheat flour tortillas. Vegetables Fresh or frozen vegetables (raw, steamed, roasted, or grilled). Low-sodium or reduced-sodium tomato and vegetable juice. Low-sodium or reduced-sodium tomato sauce and tomato paste. Low-sodium or reduced-sodium canned vegetables. Fruits All fresh, dried, or frozen fruit. Canned fruit in natural juice (without added sugar). Meat and other protein foods Skinless chicken or [place]. Ground chicken or [place]. Pork with fat trimmed off. Fish and seafood. Egg whites. Dried beans, peas, or lentils. Unsalted nuts, nut butters, and seeds. Unsalted canned beans. Lean cuts of beef with fat trimmed off. Low-sodium, lean deli meat. Dairy Low-fat (1%) or fat-free (skim) milk. Fat-free, low-fat, or reduced-fat cheeses. Nonfat, low-sodium ricotta or cottage cheese. Low-fat or nonfat yogurt. Low-fat, low-sodium cheese. Fats and oils Soft margarine without trans fats. Vegetable oil. Low-fat, reduced-fat, or light mayonnaise and salad dressings (reduced-sodium). Canola, safflower, olive, soybean, and sunflower oils. Avocado. Seasoning and other foods Herbs. Spices. Seasoning mixes without salt. Unsalted popcorn and pretzels. Fat-free sweets. What foods are not recommended? The items listed may not be a complete list. Talk with your dietitian about what dietary choices are best for you. Grains Baked goods made with fat, such as croissants, muffins, or some breads. Dry pasta or rice meal packs. Vegetables Creamed or fried vegetables. Vegetables  in a cheese sauce. Regular canned vegetables (not low-sodium or reduced-sodium). Regular canned tomato sauce and paste (not low-sodium or reduced-sodium). Regular tomato and vegetable juice (not low-sodium or reduced-sodium). Angie Fava. Olives. Fruits Canned fruit in a light or heavy syrup. Fried fruit. Fruit in cream or butter sauce. Meat and other protein foods Fatty cuts of meat. Ribs. Fried meat. Berniece Salines. Sausage. Bologna and other processed lunch meats. Salami. Fatback. Hotdogs. Bratwurst. Salted nuts and seeds. Canned beans with added salt. Canned or smoked fish. Whole eggs or egg yolks. Chicken or [place] with skin. Dairy Whole or 2% milk, cream, and half-and-half. Whole or full-fat cream cheese. Whole-fat or sweetened yogurt. Full-fat cheese. Nondairy creamers. Whipped toppings. Processed cheese and cheese spreads. Fats and oils Butter. Stick margarine. Lard. Shortening. Ghee. Bacon fat. Tropical oils, such as coconut, palm kernel, or palm oil. Seasoning and other foods Salted popcorn and pretzels. Onion salt, garlic salt, seasoned salt, table salt, and sea salt. Worcestershire sauce. Tartar sauce. Barbecue sauce. Teriyaki sauce. Soy sauce, including reduced-sodium. Steak sauce. Canned and packaged gravies. Fish sauce. Oyster sauce. Cocktail sauce. Horseradish that you find on the shelf. Ketchup. Mustard. Meat flavorings and tenderizers. Bouillon  cubes. Hot sauce and Tabasco sauce. Premade or packaged marinades. Premade or packaged taco seasonings. Relishes. Regular salad dressings. Where to find more information:  National Heart, Lung, and [place]: https://wilson-eaton.com/  American Heart Association: www.heart.org Summary  The DASH eating plan is a healthy eating plan that has been shown to reduce high blood pressure (hypertension). It may also reduce your risk for type 2 diabetes, heart disease, and stroke.  With the DASH eating plan, you should limit salt (sodium) intake to 2,300 mg a  day. If you have hypertension, you may need to reduce your sodium intake to 1,500 mg a day.  When on the DASH eating plan, aim to eat more fresh fruits and vegetables, whole grains, lean proteins, low-fat dairy, and heart-healthy fats.  Work with your health care provider or diet and nutrition specialist (dietitian) to adjust your eating plan to your individual calorie needs. This information is not intended to replace advice given to you by your health care provider. Make sure you discuss any questions you have with your health care provider. Document Released: 10/30/2011 Document Revised: 11/03/2016 Document Reviewed: 11/03/2016 Elsevier Interactive Patient Education  2017 [place] Heart-healthy meal planning includes:  Limiting unhealthy fats.  Increasing healthy fats.  Making other small dietary changes.  You may need to talk with your doctor or a diet specialist (dietitian) to create an eating plan that is right for you. What types of fat should I choose?  Choose healthy fats. These include olive oil and canola oil, flaxseeds, walnuts, almonds, and seeds.  Eat more omega-3 fats. These include salmon, mackerel, sardines, tuna, flaxseed oil, and ground flaxseeds. Try to eat fish at least twice each week.  Limit saturated fats. ? Saturated fats are often found in animal products, such as meats, butter, and cream. ? Plant sources of saturated fats include palm oil, palm kernel oil, and coconut oil.  Avoid foods with partially hydrogenated oils in them. These include stick margarine, some tub margarines, cookies, crackers, and other baked goods. These contain trans fats. What general guidelines do I need to follow?  Check food labels carefully. Identify foods with trans fats or high amounts of saturated fat.  Fill one half of your plate with vegetables and green salads. Eat 4-5 servings of vegetables per day. A serving of vegetables is: ? 1 cup of  raw leafy vegetables. ?  cup of raw or cooked cut-up vegetables. ?  cup of vegetable juice.  Fill one fourth of your plate with whole grains. Look for the word "whole" as the first word in the ingredient list.  Fill one fourth of your plate with lean protein foods.  Eat 4-5 servings of fruit per day. A serving of fruit is: ? One medium whole fruit. ?  cup of dried fruit. ?  cup of fresh, frozen, or canned fruit. ?  cup of 100% fruit juice.  Eat more foods that contain soluble fiber. These include apples, broccoli, carrots, beans, peas, and barley. Try to get 20-30 g of fiber per day.  Eat more home-cooked food. Eat less restaurant, buffet, and fast food.  Limit or avoid alcohol.  Limit foods high in starch and sugar.  Avoid fried foods.  Avoid frying your food. Try baking, boiling, grilling, or broiling it instead. You can also reduce fat by: ? Removing the skin from poultry. ? Removing all visible fats from meats. ? Skimming the fat off of stews, soups, and gravies before serving them. ?  Steaming vegetables in water or broth.  Lose weight if you are overweight.  Eat 4-5 servings of nuts, legumes, and seeds per week: ? One serving of dried beans or legumes equals  cup after being cooked. ? One serving of nuts equals 1 ounces. ? One serving of seeds equals  ounce or one tablespoon.  You may need to keep track of how much salt or sodium you eat. This is especially true if you have high blood pressure. Talk with your doctor or dietitian to get more information. What foods can I eat? Grains Breads, including [place], white, pita, wheat, raisin, rye, oatmeal, and [place]. Tortillas that are neither fried nor made with lard or trans fat. Low-fat rolls, including hotdog and hamburger buns and English muffins. Biscuits. Muffins. Waffles. Pancakes. Light popcorn. Whole-grain cereals. Flatbread. Melba toast. Pretzels. Breadsticks. Rusks. Low-fat snacks. Low-fat crackers,  including oyster, saltine, matzo, graham, animal, and rye. Rice and pasta, including brown rice and pastas that are made with whole wheat. Vegetables All vegetables. Fruits All fruits, but limit coconut. Meats and Other Protein Sources Lean, well-trimmed beef, veal, pork, and lamb. Chicken and [place] without skin. All fish and shellfish. Wild duck, rabbit, pheasant, and venison. Egg whites or low-cholesterol egg substitutes. Dried beans, peas, lentils, and tofu. Seeds and most nuts. Dairy Low-fat or nonfat cheeses, including ricotta, string, and mozzarella. Skim or 1% milk that is liquid, powdered, or evaporated. Buttermilk that is made with low-fat milk. Nonfat or low-fat yogurt. Beverages Mineral water. Diet carbonated beverages. Sweets and Desserts Sherbets and fruit ices. Honey, jam, marmalade, jelly, and syrups. Meringues and gelatins. Pure sugar candy, such as hard candy, jelly beans, gumdrops, mints, marshmallows, and small amounts of dark chocolate. [employer] Eat all sweets and desserts in moderation. Fats and Oils Nonhydrogenated (trans-free) margarines. Vegetable oils, including soybean, sesame, sunflower, olive, peanut, safflower, corn, canola, and cottonseed. Salad dressings or mayonnaise made with a vegetable oil. Limit added fats and oils that you use for cooking, baking, salads, and as spreads. Other Cocoa powder. Coffee and tea. All seasonings and condiments. The items listed above may not be a complete list of recommended foods or beverages. Contact your dietitian for more options. What foods are not recommended? Grains Breads that are made with saturated or trans fats, oils, or whole milk. Croissants. Butter rolls. Cheese breads. Sweet rolls. Donuts. Buttered popcorn. Chow mein noodles. High-fat crackers, such as cheese or butter crackers. Meats and Other Protein Sources Fatty meats, such as hotdogs, short ribs, sausage, spareribs, bacon, rib eye roast or steak, and  mutton. High-fat deli meats, such as salami and bologna. Caviar. Domestic duck and goose. Organ meats, such as kidney, liver, sweetbreads, and heart. Dairy Cream, sour cream, cream cheese, and creamed cottage cheese. Whole-milk cheeses, including blue (bleu), Monterey Jack, [place], [place], American, [place], Swiss, cheddar, [place], and [place]. Whole or 2% milk that is liquid, evaporated, or condensed. Whole buttermilk. Cream sauce or high-fat cheese sauce. Yogurt that is made from whole milk. Beverages Regular sodas and juice drinks with added sugar. Sweets and Desserts Frosting. Pudding. Cookies. Cakes other than angel food cake. Candy that has milk chocolate or white chocolate, hydrogenated fat, butter, coconut, or unknown ingredients. Buttered syrups. Full-fat ice cream or ice cream drinks. Fats and Oils Gravy that has suet, meat fat, or shortening. Cocoa butter, hydrogenated oils, palm oil, coconut oil, palm kernel oil. These can often be found in baked products, candy, fried foods, nondairy creamers, and whipped toppings. Solid fats  and shortenings, including bacon fat, salt pork, lard, and butter. Nondairy cream substitutes, such as coffee creamers and sour cream substitutes. Salad dressings that are made of unknown oils, cheese, or sour cream. The items listed above may not be a complete list of foods and beverages to avoid. Contact your dietitian for more information. This information is not intended to replace advice given to you by your health care provider. Make sure you discuss any questions you have with your health care provider. Document Released: 05/11/2012 Document Revised: 04/17/2016 Document Reviewed: 05/04/2014 Elsevier Interactive Patient Education  [employer].

## 2017-09-17 NOTE — Assessment & Plan Note (Signed)
Currently maintained on lyrica 200mg  tid. Shes not willing to increase the dose but it does provide some relief. Will trial nortriptyline 10mg  daily at bedtime. Side effects of gi upset, decreased libido, suicidality discussed. Instructed to go to er immediately for any thoughts of suicide. If no relief will consider alternate medications, EMG, MRI.

## 2017-09-17 NOTE — Assessment & Plan Note (Signed)
Blood pressure elevated above goal 140/90. Discussed risks of elevated blood pressure on organs. She is willing to try medication today. Amlodipine 2.5 mg daily prescribed. Adverse reactions including edema discussed. She will try to check her blood pressure at least twice a week and return in 1 month for a recheck. Discussed healthy diet and exercise as a goal rather than focusing on weight loss, handout given.

## 2017-09-17 NOTE — Progress Notes (Signed)
Subjective:    Patient ID: Catherine Rogers, female    DOB: 1961/05/14, 56 y.o.   MRN: [id number]  HPI [name] is a 56yo female who presents today to establish care. She Is transferring to me from another provider in the same clinic.  Hypertension- Reports history of hypertension. Was given medication in the past but did not take because she was worried about side effects. Denies headaches, chest pain, shortness of breath. She reports a poor diet and fear of losing too much weight after a past history of significant weight loss after surgery.  BP Readings from Last 3 Encounters:  09/17/17 (!) 142/92  05/20/17 137/86  04/24/17 (!) 146/94   Nausea- Describes as intermittent. This problem began one year ago. She experiences the nasuea about twice a week. Certain smells like perfumes, cigarettes and strong food trigger the nausea.  She reports dry heaves. She denies fevers, abdominal pain, vomiting, diarrhea. Shes had zofran in the past for this and it helps.  Neuropathy-Left leg. Maintained on lyrica. Describes as sharp pain radiating from her head to her foot on the left side of her body. She has numbness in her left foot. She denies weakness. She feels lyrica helps her walk better but she does continue to have daily pain.  Review of Systems  See HPI  Past Medical History:  Diagnosis Date  . Anemia   . Anxiety   . Arthritis   . Asthma   . Cancer ([address])    colon and ovarian  . Complication of anesthesia    woke and saw long tube coming out of mouth . sat up then went back to sleep  . Depression   . Ectopic pregnancy   . Family history of adverse reaction to anesthesia    sister on dialysis- hypotension   . Family history of ovarian cancer   . Head injury, closed, with brief LOC ([place])    had a cut- as a child  . Headache   . Hepatitis    being treated for Hep C  . History of blood transfusion    2005 approximately   . Hypertension    no meds now  . PAD (peripheral artery  disease) ([place]) 04/18/2015  . Pelvic cyst   . PONV (postoperative nausea and vomiting)   . PTSD (post-traumatic stress disorder)      Social History   Social History  . Marital status: Widowed    Spouse name: N/A  . Number of children: 6  . Years of education: 68   Occupational History  . Disability    Social History Main Topics  . Smoking status: Current Some Day Smoker    Years: 10.00    Types: Cigars  . Smokeless tobacco: Never Used     Comment: pack per month of cigars = 5  . Alcohol use No  . Drug use: Yes    Frequency: 5.0 times per week    Types: Marijuana     Comment: last smoked 04-01-17  . Sexual activity: No   Other Topics Concern  . Not on file   Social History Narrative   Born and raised in [place], [place]. Currently resides in a house by herself. No pets. Fun: Read, shoot pool.    Denies religious beliefs that would effect health care.    Lives at home alone.   Right-handed.   No caffeine use.   Denies abuse and feels safe at home.     Past Surgical History:  Procedure Laterality Date  . ABDOMINAL HYSTERECTOMY    . BREAST BIOPSY Right 04/02/2017   Procedure: INCISIONAL BIOPSY RIGHT NIPPLE;  Surgeon: Coralie Keens, MD;  Location: [facility];  Service: General;  Laterality: Right;  . COLONOSCOPY    . ESOPHAGOGASTRODUODENOSCOPY N/A 09/17/2014   Procedure: ESOPHAGOGASTRODUODENOSCOPY (EGD);  Surgeon: Jeryl Columbia, MD;  Location: [facility] ENDOSCOPY;  Service: Endoscopy;  Laterality: N/A;  . FEMORAL-FEMORAL BYPASS GRAFT Bilateral 04/18/2015   Procedure:  RIGHT COMMON FEMORAL TO LEFT COMMON  FEMORAL  ARTERY BYPASS WITH  8 MM HEMASHIELD GRAFT,RIGHT COMMON SUPERFICIAL FEMORAL ARTERY ENDARTERECTOMY;  Surgeon: Elam Dutch, MD;  Location: [facility];  Service: Vascular;  Laterality: Bilateral;  . ILEOSTOMY Right 09/18/2014   Procedure: ILEOCOLOSTOMY;  Surgeon: Doreen Salvage, MD;  Location: [facility];  Service: General;  Laterality: Right;  . LAPAROTOMY N/A  11/01/2014   Procedure: EXPLORATORY LAPAROTOMY;  Surgeon: Doreen Salvage, MD;  Location: [facility];  Service: General;  Laterality: N/A;  . LAPAROTOMY N/A 12/26/2014   Procedure:  LAPAROTOMY ;  Surgeon: Everitt Amber, MD;  Location: [facility];  Service: Gynecology;  Laterality: N/A;  . MASS EXCISION  11/01/2014   Procedure: EXCISION PELVIC MASS;  Surgeon: Doreen Salvage, MD;  Location: [facility];  Service: General;;  . OOPHORECTOMY  2016  . PARTIAL COLECTOMY Right 09/18/2014   Procedure: RIGHT HEMI COLECTOMY;  Surgeon: Doreen Salvage, MD;  Location: [facility];  Service: General;  Laterality: Right;  . ROBOTIC ASSISTED TOTAL HYSTERECTOMY WITH BILATERAL SALPINGO OOPHERECTOMY Right 12/26/2014   Procedure: EXPLORATORY LAPAROSCOPY  RIGHT SALPINGO OOPHORECTOMY OMENTECTOMY LYMPHADECTOMY ;  Surgeon: Everitt Amber, MD;  Location: [facility];  Service: Gynecology;  Laterality: Right;  . TUBAL LIGATION      Family History  Problem Relation Age of Onset  . Brain cancer Sister 67  . Lung cancer Brother 50  . Heart attack Mother   . Heart attack Father        59s  . Heart attack Maternal Uncle   . Diabetes Maternal Uncle   . Breast cancer Maternal Uncle   . Ovarian cancer Paternal Aunt        3 paternal aunts with ovarian cancer  . Heart attack Brother   . Diabetes Paternal Uncle     Allergies  Allergen Reactions  . Other Hives and Itching    Highly acidic foods "make me itch and break out" oranges, tomatoes  . Demerol [Meperidine] Nausea And Vomiting  . Morphine And Related Nausea And Vomiting  . Oxycodone Itching  . Penicillins Other (See Comments)    Childhood allergy  . Tylenol [Acetaminophen] Nausea And Vomiting  . Ibuprofen Nausea And Vomiting    Current Outpatient Prescriptions on File Prior to Visit  Medication Sig Dispense Refill  . acetaminophen (TYLENOL) 325 MG tablet Take 325 mg by mouth as needed.    . docusate sodium 100 MG CAPS Take 100 mg by mouth 2 (two) times daily as needed for mild constipation. 10 capsule 0    . pregabalin (LYRICA) 200 MG capsule take 1 capsule by mouth every 8 hours 42 capsule 0   No current facility-administered medications on file prior to visit.     BP (!) 142/92 (BP Location: Left Arm, Patient Position: Sitting, Cuff Size: Large)   Pulse 71   Temp 99.2 F (37.3 C) (Oral)   Resp 16   Ht 5\' 7"  (1.702 m)   Wt 189 lb 12.8 oz (86.1 kg)   SpO2 95%  BMI 29.73 kg/m      Objective:   Physical Exam  Constitutional: She is oriented to person, place, and time. She appears well-developed and well-nourished.  Cardiovascular: Normal rate, regular rhythm, normal heart sounds and intact distal pulses.   Pulmonary/Chest: Effort normal and breath sounds normal.  Musculoskeletal: Normal range of motion. She exhibits no edema or deformity.  Neurological: She is alert and oriented to person, place, and time. She has normal strength. She displays normal reflexes. No sensory deficit. Coordination normal.  Skin: Skin is warm and dry.  Psychiatric: She has a normal mood and affect. Judgment and thought content normal.     romnormal Dt +     Assessment & Plan:  Nausea- no fevers, abdominal pain, vomiting, diarrhea. Triggered by strong smells. Zofran prescription given. Instructed to follow up if nausea is not improved with zofran.

## 2017-09-22 DIAGNOSIS — M1990 Unspecified osteoarthritis, unspecified site: Secondary | ICD-10-CM | POA: Diagnosis not present

## 2017-09-22 DIAGNOSIS — F17200 Nicotine dependence, unspecified, uncomplicated: Secondary | ICD-10-CM | POA: Diagnosis not present

## 2017-09-22 DIAGNOSIS — G8929 Other chronic pain: Secondary | ICD-10-CM | POA: Diagnosis not present

## 2017-09-22 DIAGNOSIS — M79672 Pain in left foot: Secondary | ICD-10-CM | POA: Diagnosis not present

## 2017-09-22 DIAGNOSIS — I10 Essential (primary) hypertension: Secondary | ICD-10-CM | POA: Diagnosis not present

## 2017-09-23 ENCOUNTER — Encounter (HOSPITAL_COMMUNITY): Payer: Self-pay | Admitting: Emergency Medicine

## 2017-09-23 ENCOUNTER — Telehealth: Payer: Self-pay | Admitting: Nurse Practitioner

## 2017-09-23 ENCOUNTER — Emergency Department (HOSPITAL_COMMUNITY)
Admission: EM | Admit: 2017-09-23 | Discharge: 2017-09-23 | Disposition: A | Discharge status: Home / Self Care | Payer: Medicare Other | Attending: Emergency Medicine | Admitting: Emergency Medicine

## 2017-09-23 ENCOUNTER — Other Ambulatory Visit: Payer: Self-pay

## 2017-09-23 DIAGNOSIS — J45909 Unspecified asthma, uncomplicated: Secondary | ICD-10-CM | POA: Insufficient documentation

## 2017-09-23 DIAGNOSIS — Z79899 Other long term (current) drug therapy: Secondary | ICD-10-CM | POA: Diagnosis not present

## 2017-09-23 DIAGNOSIS — M79672 Pain in left foot: Secondary | ICD-10-CM | POA: Diagnosis not present

## 2017-09-23 DIAGNOSIS — Z886 Allergy status to analgesic agent status: Secondary | ICD-10-CM | POA: Insufficient documentation

## 2017-09-23 DIAGNOSIS — F17290 Nicotine dependence, other tobacco product, uncomplicated: Secondary | ICD-10-CM | POA: Diagnosis not present

## 2017-09-23 DIAGNOSIS — I739 Peripheral vascular disease, unspecified: Secondary | ICD-10-CM

## 2017-09-23 DIAGNOSIS — I10 Essential (primary) hypertension: Secondary | ICD-10-CM | POA: Diagnosis not present

## 2017-09-23 LAB — CBC WITH DIFFERENTIAL/PLATELET
Basophils Absolute: 0 10*3/uL (ref 0.0–0.1)
Basophils Relative: 0 %
EOS PCT: 1 %
Eosinophils Absolute: 0.1 10*3/uL (ref 0.0–0.7)
HEMATOCRIT: 42.8 % (ref 36.0–46.0)
Hemoglobin: 15.5 g/dL — ABNORMAL HIGH (ref 12.0–15.0)
LYMPHS ABS: 2.5 10*3/uL (ref 0.7–4.0)
LYMPHS PCT: 45 %
MCH: 35.7 pg — AB (ref 26.0–34.0)
MCHC: 36.2 g/dL — ABNORMAL HIGH (ref 30.0–36.0)
MCV: 98.6 fL (ref 78.0–100.0)
MONO ABS: 0.3 10*3/uL (ref 0.1–1.0)
Monocytes Relative: 6 %
Neutro Abs: 2.6 10*3/uL (ref 1.7–7.7)
Neutrophils Relative %: 48 %
PLATELETS: 155 10*3/uL (ref 150–400)
RBC: 4.34 MIL/uL (ref 3.87–5.11)
RDW: 12.5 % (ref 11.5–15.5)
WBC: 5.5 10*3/uL (ref 4.0–10.5)

## 2017-09-23 LAB — BASIC METABOLIC PANEL
Anion gap: 10 (ref 5–15)
BUN: 11 mg/dL (ref 6–20)
CHLORIDE: 105 mmol/L (ref 101–111)
CO2: 26 mmol/L (ref 22–32)
Calcium: 9.1 mg/dL (ref 8.9–10.3)
Creatinine, Ser: 0.85 mg/dL (ref 0.44–1.00)
GFR calc Af Amer: >60 mL/min (ref >60)
GFR calc non Af Amer: >60 mL/min (ref >60)
GLUCOSE: 96 mg/dL (ref 65–99)
POTASSIUM: 4.6 mmol/L (ref 3.5–5.1)
Sodium: 141 mmol/L (ref 135–145)

## 2017-09-23 LAB — PROTIME-INR
INR: 0.89
Prothrombin Time: 12 seconds (ref 11.4–15.2)

## 2017-09-23 NOTE — ED Triage Notes (Addendum)
Pt reports left foot "cold" since this am. Pt reports history of same since surgery "several years ago." pt reports was sent over by PCP. Distal pulses moderate in LLE.

## 2017-09-23 NOTE — ED Provider Notes (Signed)
Emergency Department Provider Note   I have reviewed the triage vital signs and the nursing notes.   HISTORY  Chief Complaint Foot Pain   HPI Catherine Rogers is a 56 y.o. female with PMH of colon and ovarian cancer and PAD requiring fem-fem bypass with Dr. Oneida Alar in 2016 resents to the emergency department for evaluation of cold sensation in the left foot for the past 2 days.  Patient states that she has baseline pain in the upper leg which has been present since her surgery.  She denies significant worsening pain in the lower leg or foot.  She knows that the foot was very cold and so presented to the [facility] emergency department last night.  She waited for a Mi[place]oda Mcmillion time in the waiting room and ultimately left without being seen.  She spoke to her primary care physician by phone this morning who encouraged her to present to this emergency department today.  The patient is not anticoagulated.  She denies any injury to the foot.    Past Medical History:  Diagnosis Date  . Anemia   . Anxiety   . Arthritis   . Asthma   . Cancer ([place])    colon and ovarian  . Complication of anesthesia    woke and saw Catherine Rogers tube coming out of mouth . sat up then went back to sleep  . Depression   . Ectopic pregnancy   . Family history of adverse reaction to anesthesia    sister on dialysis- hypotension   . Family history of ovarian cancer   . Head injury, closed, with brief LOC ([place])    had a cut- as a child  . Headache   . Hepatitis    being treated for Hep C  . History of blood transfusion    2005 approximately   . Hypertension    no meds now  . PAD (peripheral artery disease) ([place]) 04/18/2015  . Pelvic cyst   . PONV (postoperative nausea and vomiting)   . PTSD (post-traumatic stress disorder)     Patient Active Problem List   Diagnosis Date Noted  . Nausea 09/17/2017  . Neuropathy 09/17/2017  . Chronic hepatitis C without hepatic coma ([place]) 12/31/2016  . Routine general medical  examination at a health care facility 09/04/2016  . Vitamin D deficiency 09/04/2016  . Medicare annual wellness visit, subsequent 09/04/2016  . Type II CRPS (complex regional pain syndrome) 10/22/2015  . Numbness of left foot 07/05/2015  . BRCA1 genetic carrier 05/12/2015  . PAD (peripheral artery disease) ([place]) 04/18/2015  . Genetic testing 04/06/2015  . Family history of ovarian cancer   . Lumbosacral plexopathy 01/29/2015  . Left leg numbness 01/05/2015  . Postoperative anemia due to acute blood loss 12/28/2014  . Ovarian cancer ([place]) 12/26/2014  . Hypertension 12/22/2014  . Anxiety 12/22/2014  . Cancer associated pain 12/22/2014  . 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 ([place]) 11/28/2014  . Cancer of right colon ([place]) 10/11/2014  . Intussusception, ileocecal ([place]) 09/18/2014  . Mass of pelvis 09/18/2014  . GI bleed 09/16/2014  . Alcohol use 09/16/2014    Past Surgical History:  Procedure Laterality Date  . ABDOMINAL HYSTERECTOMY    . BREAST BIOPSY Right 04/02/2017   Procedure: INCISIONAL BIOPSY RIGHT NIPPLE;  Surgeon: Coralie Keens, MD;  Location: [place];  Service: General;  Laterality: Right;  . COLONOSCOPY    . ESOPHAGOGASTRODUODENOSCOPY N/A 09/17/2014   Procedure: ESOPHAGOGASTRODUODENOSCOPY (EGD);  Surgeon: Jeryl Columbia, MD;  Location:  [place];  Service: Endoscopy;  Laterality: N/A;  . FEMORAL-FEMORAL BYPASS GRAFT Bilateral 04/18/2015   Procedure:  RIGHT COMMON FEMORAL TO LEFT COMMON  FEMORAL  ARTERY BYPASS WITH  8 MM HEMASHIELD GRAFT,RIGHT COMMON SUPERFICIAL FEMORAL ARTERY ENDARTERECTOMY;  Surgeon: Elam Dutch, MD;  Location: [place];  Service: Vascular;  Laterality: Bilateral;  . ILEOSTOMY Right 09/18/2014   Procedure: ILEOCOLOSTOMY;  Surgeon: Doreen Salvage, MD;  Location: [place];  Service: General;  Laterality: Right;  . LAPAROTOMY N/A 11/01/2014   Procedure: EXPLORATORY LAPAROTOMY;  Surgeon: Doreen Salvage, MD;  Location: [place];  Service: General;  Laterality: N/A;    . LAPAROTOMY N/A 12/26/2014   Procedure:  LAPAROTOMY ;  Surgeon: Everitt Amber, MD;  Location: [facility];  Service: Gynecology;  Laterality: N/A;  . MASS EXCISION  11/01/2014   Procedure: EXCISION PELVIC MASS;  Surgeon: Doreen Salvage, MD;  Location: [place];  Service: General;;  . OOPHORECTOMY  2016  . PARTIAL COLECTOMY Right 09/18/2014   Procedure: RIGHT HEMI COLECTOMY;  Surgeon: Doreen Salvage, MD;  Location: [place];  Service: General;  Laterality: Right;  . ROBOTIC ASSISTED TOTAL HYSTERECTOMY WITH BILATERAL SALPINGO OOPHERECTOMY Right 12/26/2014   Procedure: EXPLORATORY LAPAROSCOPY  RIGHT SALPINGO OOPHORECTOMY OMENTECTOMY LYMPHADECTOMY ;  Surgeon: Everitt Amber, MD;  Location: [facility];  Service: Gynecology;  Laterality: Right;  . TUBAL LIGATION      Current Outpatient Rx  . Order #: [id number] Class: Historical Med  . Order #: [id number] Class: Normal  . Order #: [id number] Class: Normal  . Order #: [id number] Class: Normal  . Order #: [id number] Class: Normal  . Order #: [id number] Class: Print    Allergies Other; Demerol [meperidine]; Morphine and related; Oxycodone; Penicillins; Tylenol [acetaminophen]; and Ibuprofen  Family History  Problem Relation Age of Onset  . Brain cancer Sister 42  . Lung cancer Brother 40  . Heart attack Mother   . Heart attack Father        45s  . Heart attack Maternal Uncle   . Diabetes Maternal Uncle   . Breast cancer Maternal Uncle   . Ovarian cancer Paternal Aunt        3 paternal aunts with ovarian cancer  . Heart attack Brother   . Diabetes Paternal Uncle     Social History Social History  Substance Use Topics  . Smoking status: Current Some Day Smoker    Years: 10.00    Types: Cigars  . Smokeless tobacco: Never Used     Comment: pack per month of cigars = 5  . Alcohol use No    Review of Systems  Constitutional: No fever/chills Eyes: No visual changes. ENT: No sore throat. Cardiovascular: Denies chest pain. Respiratory: Denies shortness of  breath. Gastrointestinal: No abdominal pain.  No nausea, no vomiting.  No diarrhea.  No constipation. Genitourinary: Negative for dysuria. Musculoskeletal: Negative for back pain. Ongoing left leg pain.  Skin: Negative for rash. Cold left foot.  Neurological: Negative for headaches, focal weakness or numbness.  10-point ROS otherwise negative.  ____________________________________________   PHYSICAL EXAM:  VITAL SIGNS: ED Triage Vitals [09/23/17 1153]  Enc Vitals Group     BP (!) 183/92     Pulse Rate 77     Resp 18     Temp 98.3 F (36.8 C)     Temp Source Oral     SpO2 99 %     Weight 189 lb (85.7 kg)     Height _0  (1.702 m)     Pain Score  8   Constitutional: Alert and oriented. Well appearing and in no acute distress. Eyes: Conjunctivae are normal.  Head: Atraumatic. Nose: No congestion/rhinnorhea. Mouth/Throat: Mucous membranes are moist. Neck: No stridor.  Cardiovascular: Normal rate, regular rhythm. Grossly normal heart sounds. Palpable DP/PT pulses in the right foot. Able to doppler left DP but no the PT. Left foot cool compared to the right. Capillary refill is sluggish but present on the left.  Respiratory: Normal respiratory effort.  No retractions. Lungs CTAB. Gastrointestinal: Soft and nontender. No distention.  Musculoskeletal: No lower extremity tenderness nor edema. No gross deformities of extremities. Neurologic:  Normal speech and language. No gross focal neurologic deficits are appreciated.  Skin:  Skin is warm, dry and intact. No rash noted.   ____________________________________________   LABS (all labs ordered are listed, but only abnormal results are displayed)  Labs Reviewed  CBC WITH DIFFERENTIAL/PLATELET - Abnormal; Notable for the following:       Result Value   Hemoglobin 15.5 (*)    MCH 35.7 (*)    MCHC 36.2 (*)    All other components within normal limits  BASIC METABOLIC PANEL  PROTIME-INR    ____________________________________________  RADIOLOGY  None ____________________________________________   PROCEDURES  Procedure(s) performed:   Procedures  None ____________________________________________   INITIAL IMPRESSION / ASSESSMENT AND PLAN / ED COURSE  Pertinent labs & imaging results that were available during my care of the patient were reviewed by me and considered in my medical decision making (see chart for details).  Patient with sluggish capillary refill and cool left foot. Able to doppler DP pulse on the left. Will draw basic labs and discuss with Vascular Surgery. No increased pain in the foot. Has chronic pain in the leg. No hard signs of acute arterial occlusion.   12:50 PM Spoke with Vascular Surgery Dr. Trula Slade by phone who reviewed the patient's clinic notes and case. Patient has a dopplerable DP pulse on the right with no other hard signs of acute limb ischemia. Pain in the leg not significantly worsening. I agree that the patient does not require emergent evaluation at this time. He has called the office to schedule an appointment first thing tomorrow morning.    ____________________________________________  FINAL CLINICAL IMPRESSION(S) / ED DIAGNOSES  Final diagnoses:  Foot pain, left     MEDICATIONS GIVEN DURING THIS VISIT:  Medications - No data to display   NEW OUTPATIENT MEDICATIONS STARTED DURING THIS VISIT:  None   Note:  This document was prepared using Dragon voice recognition software and may include unintentional dictation errors.  Nanda Quinton, MD Emergency Medicine    Bazil Dhanani, [facility], MD 09/23/17 [phone]

## 2017-09-23 NOTE — Discharge Instructions (Signed)
You were seen in the ED today with a cold foot. The blood continues to flow to this foot but is weak. I spoke with your Vascular Surgery team and they would like to see you in the office first thing tomorrow morning. They should have called to set up that appointment. If you develop any sudden worsening pain in the foot you should return to the Emergency Department.

## 2017-09-23 NOTE — Telephone Encounter (Signed)
Patient called saying that's she would like a refill on pregabalin (LYRICA) 200 MG capsule - one every 8 hours and does not wish to continue taking nortriptyline (PAMELOR) 10 MG capsule.

## 2017-09-23 NOTE — Telephone Encounter (Signed)
Patient called wanting to let [facility] know that she was currently at ED.  I informed patient to call our office once released to make a follow up.

## 2017-09-23 NOTE — Telephone Encounter (Signed)
Please advise 

## 2017-09-23 NOTE — Telephone Encounter (Signed)
noted 

## 2017-09-24 ENCOUNTER — Encounter: Payer: Self-pay | Admitting: Vascular Surgery

## 2017-09-24 ENCOUNTER — Ambulatory Visit (HOSPITAL_COMMUNITY)
Admission: RE | Admit: 2017-09-24 | Discharge: 2017-09-24 | Disposition: A | Discharge status: Home / Self Care | Payer: Medicare Other | Source: Ambulatory Visit | Attending: Vascular Surgery | Admitting: Vascular Surgery

## 2017-09-24 ENCOUNTER — Ambulatory Visit (INDEPENDENT_AMBULATORY_CARE_PROVIDER_SITE_OTHER): Payer: Medicare Other | Admitting: Vascular Surgery

## 2017-09-24 VITALS — BP 160/100 | HR 72 | Temp 98.0°F | Resp 16 | Ht 67.0 in | Wt 187.0 lb

## 2017-09-24 DIAGNOSIS — Z87891 Personal history of nicotine dependence: Secondary | ICD-10-CM | POA: Insufficient documentation

## 2017-09-24 DIAGNOSIS — G90522 Complex regional pain syndrome I of left lower limb: Secondary | ICD-10-CM | POA: Diagnosis not present

## 2017-09-24 DIAGNOSIS — I739 Peripheral vascular disease, unspecified: Secondary | ICD-10-CM

## 2017-09-24 DIAGNOSIS — R0989 Other specified symptoms and signs involving the circulatory and respiratory systems: Secondary | ICD-10-CM | POA: Insufficient documentation

## 2017-09-24 DIAGNOSIS — I779 Disorder of arteries and arterioles, unspecified: Secondary | ICD-10-CM | POA: Diagnosis not present

## 2017-09-24 MED ORDER — PREGABALIN 200 MG PO CAPS: ORAL_CAPSULE | ORAL | 3 refills | Status: DC

## 2017-09-24 NOTE — Telephone Encounter (Signed)
It appears that Dr Oneida Alar discontinued her nortriptyline and refilled her lyrica already.

## 2017-09-24 NOTE — Progress Notes (Signed)
Patient is a 56 year old female who we have previously done a right to left femoral-femoral bypass on in 2016. She also has a history of complex regional pain syndrome in the left lower extremity. Her initial femoral-femoral bypass was done for pain in her left leg and foot. She at that time had a left common iliac artery occlusion. Despite revascularization she never really had improvement of her foot symptoms and we have not follow-up since that time that her pain syndrome with secondary to peripheral arterial disease that it was more neuropathic in nature. She has been seen in the pain clinic she has also been seen by neurology and the consensus is that she has some element of nerve damage from previous multiple pelvic operations. She reports that she had been doing well until a recent change in her medications where she stopped taking Lyrica and was placed on nortriptyline. Ever since then she has had worsening pain over the left pretibial region and left foot. She also complains of a cool sensation in the left foot. She states that she was doing very well although not perfect on 200 mg of Lyrica 3 times a day. She denies any claudication symptoms. She has no nonhealing wounds.  Review of systems: She denies chest pain. She denies shortness of breath.  Current Outpatient Prescriptions on File Prior to Visit  Medication Sig Dispense Refill  . acetaminophen (TYLENOL) 325 MG tablet Take 325 mg by mouth as needed.    [name] amLODipine (NORVASC) 2.5 MG tablet Take 1 tablet (2.5 mg total) by mouth daily. 30 tablet 1  . docusate sodium 100 MG CAPS Take 100 mg by mouth 2 (two) times daily as needed for mild constipation. 10 capsule 0  . nortriptyline (PAMELOR) 10 MG capsule Take 1 capsule (10 mg total) by mouth at bedtime. 30 capsule 3  . ondansetron (ZOFRAN) 4 MG tablet Take 1 tablet (4 mg total) by mouth every 8 (eight) hours as needed for nausea or vomiting. 20 tablet 0   No current facility-administered  medications on file prior to visit.     Allergies  Allergen Reactions  . Other Hives and Itching    Highly acidic foods "make me itch and break out" oranges, tomatoes  . Demerol [Meperidine] Nausea And Vomiting  . Morphine And Related Nausea And Vomiting  . Oxycodone Itching  . Penicillins Other (See Comments)    Childhood allergy  . Tylenol [Acetaminophen] Nausea And Vomiting  . Ibuprofen Nausea And Vomiting    Physical exam:  Vitals:   09/24/17 1011  BP: (!) 160/100  Pulse: 72  Resp: 16  Temp: 98 F (36.7 C)  TempSrc: Oral  SpO2: 100%  Weight: 187 lb (84.8 kg)  Height: 5\' 7"  (1.702 m)    Abdomen: Soft mildly tender diffusely at baseline 2+ graft pulse in her femoral-femoral bypass.  Extremities: Symmetrically cool feet no palpable pedal pulses. Data: Patient had bilateral ABIs performed today which were 0.75 on the left 0.76 on the right unchanged from 2016.  Assessment: Complex regional pain syndrome left foot exacerbated by recent medication change. I do not believe that her current pain symptoms are related to peripheral arterial disease as her ABIs have not changed over the last 2 years and her symptoms have been stable.  Plan: She will follow-up with [place] and see our nurse practitioner in 6 months time. She will have bilateral ABIs repeated at that time. Today I put her back on her Lyrica 200 mg 3 times a  day. I discontinued her nortriptyline. I will forward this information to her primary care provider.  Ruta Hinds, MD [facility] Office: [phone] Pager: [phone]

## 2017-09-28 ENCOUNTER — Ambulatory Visit: Payer: Medicare Other | Admitting: Nurse Practitioner

## 2017-10-05 NOTE — Progress Notes (Signed)
[facility]  [phone] [phone]  Clinic follow Up Note   Patient Care Team: Shambley, Ashleigh N, NP as PCP - General (Nurse Practitioner) Feng, Yan, MD as Consulting Physician (Hematology) Wyatt, James, MD as Consulting Physician (General Surgery) Galloway, Dionne, MD as Referring Physician (Obstetrics and Gynecology) Fields, Charles E, MD as Consulting Physician (Vascular Surgery) Gehrig, Paola, MD as Attending Physician (Gynecologic Oncology) 10/07/2017   CHIEF COMPLAINTS:  1. Colon cancer, pT3N1M0, stage III 2. Ovarian cancer, pT1cNxM0, stage I  3. BRCA1 mutation (+)    Cancer of right colon (HCC)   09/16/2014 Imaging    CT abd/pel:  a 3.8cm cecal mass with associated ileocecal intussusception, and a 9.1cm solid and cystic mass in the left pelvis,      09/18/2014 Initial Diagnosis    Adenocarcinoma of right colon      09/18/2014 Pathologic Stage    pT3pN1Mx, tumor extends into pericolonic soft tissue and is less than 1mm from the serosal surface, LVI 8-), PNI (-), all 23 node negative, one soft tissue tumor deposit, surgical margins negative.       09/18/2014 Surgery    right hemicolectomy and terminal ileoectomy      08/06/2016 Imaging    CT chest/abd/pelvis with contrast IMPRESSION: 1. No recurrent malignancy identified. 2. 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 On the prior exam from 09/13/2015 there was back flow of contrast in the left external iliac artery to supply the left internal iliac artery ; this back flow of contrast is no longer present in the left external iliac artery is occluded. 3. Several pulmonary nodules at or below 4 mm diameter, unchanged. 4. 3 mm hypodensity posteriorly in the body of the pancreas, not well seen previously and subtle today, likely incidental, but merits observation. 5. 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 Prominent degenerative spurring of both acetabula. 6. Lumbar spondylosis and degenerative disc disease causing prominent impingement at L4-5 and mild impingement at L5-S1.      03/08/2017 Imaging    CT CAP W Contrast 03/08/17 IMPRESSION: Chest Impression: 1. Stable small pulmonary nodules within LEFT lung. 2. No mediastinal lymphadenopathy. Abdomen / Pelvis Impression: 1. No evidence of metastatic colorectal cancer or ovarian cancer within the abdomen pelvis. 2. No lymphadenopathy. 3. Postsurgical change consistent RIGHT hemicolectomy and hysterectomy 4. A fem-fem arterial bypass noted.       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 (HCC)   10/09/2014 Imaging    CT of the abdomen and pelvis showed a 9.1 cm solid and cystic mass in the left pelvis concerning for malignancy. This was discovered during her workup for colon cancer.      11/01/2014 Initial Diagnosis    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      11/01/2014 Surgery    Salpingo-oophorectomy.      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 Tumor size 3.7 cm, no lymph nodes removed. T1cNx. Tumor cells are positive for cytokeratin 7 and estrogen receptor.      03/08/2017 Imaging    CT CAP W Contrast 03/08/17 IMPRESSION: Chest Impression: 1. Stable small pulmonary nodules within LEFT lung. 2. No mediastinal lymphadenopathy. Abdomen / Pelvis Impression: 1. No evidence of metastatic colorectal cancer or ovarian cancer within the abdomen pelvis. 2. No lymphadenopathy. 3. Postsurgical change consistent RIGHT hemicolectomy and hysterectomy   4. A fem-fem arterial bypass noted.      03/12/2017 Mammogram    MM Right Breast 03/12/17 IMPRESSION: No mammographic or sonographic evidence of malignancy. No correlate identified for the abnormal enhancement seen within the right nipple on MRI of 02/27/2017.        04/02/2017 Pathology Results    Surgical Pathology of Nipple Biopsy: 04/02/17 Diagnosis Nipple Biopsy, Right - BENIGN NIPPLE TISSUE. - NO ATYPIA OR MALIGNANCY.         HISTORY OF PRESENTING ILLNESS:  Catherine Rogers 56 y.o. female is here because of recently diagnosed colon cancer.   She presented with abdominal pain, nausea and vomiting, hemoptoses in Oct 2015.  She presented to [facility] for worsening symptoms on 09/15/2014 and was subsequently transferred to [facility]. She had a CT scan that revealed a 3.8cm cecal mass with associated ileocecal intussusception, and a 9.1cm solid and cystic mass in the left pelvis, likely a peritoneal met with ascites.  EGD was negative. She had exploratory laparoscopy on 09/18/2014 by Dr. Hulen Skains, and underwent right hemicolectomy and ileocecectomy. The pathology reviewed also related an invasive adenocarcinoma at the terminal ileum and cecum. She was discharged home on 09/22/2014.  She underwent a second surgery for left pelvis cystic mass resection on 11/01/2014, surgical path showed ovarian cancer.  CURRENT TREATMENT: Surveillance  INTERIM HISTORY: Catherine Rogers returns for follow-up. She was last seen by me 6 months ago. She presents to the clinic today noting her pain feels better since given medication for the nerves in her leg. This has some effects on her balance but she is still functional. When she coughs or sneezes it will cause a cramp in her stomach and through out her body. She takes extra strength Tylenol. She is had teeth implants and was given narcotics and now is no longer on it. Her energy is well enough to cook for Thanksgiving. She is overall doing well.    MEDICAL HISTORY:  Past Medical History:  Diagnosis Date  . Anemia   . Anxiety   . Arthritis   . Asthma   . Cancer ([place])    colon and ovarian  . Complication of anesthesia    woke and saw long tube coming out of mouth . sat up then went back to sleep  .  Depression   . Ectopic pregnancy   . Family history of adverse reaction to anesthesia    sister on dialysis- hypotension   . Family history of ovarian cancer   . Head injury, closed, with brief LOC ([place])    had a cut- as a child  . Headache   . Hepatitis    being treated for Hep C  . History of blood transfusion    2005 approximately   . Hypertension    no meds now  . PAD (peripheral artery disease) ([place]) 04/18/2015  . Pelvic cyst   . PONV (postoperative nausea and vomiting)   . PTSD (post-traumatic stress disorder)      SURGICAL HISTORY: Past Surgical History:  Procedure Laterality Date  . ABDOMINAL HYSTERECTOMY    . COLONOSCOPY    . OOPHORECTOMY  2016  . TUBAL LIGATION      SOCIAL HISTORY: Social History   Socioeconomic History  . Marital status: Widowed    Spouse name: Not on file  . Number of children: 6  . Years of education: 53  . Highest education level: Not on file  Social Needs  . Financial resource strain: Not on  file  . Food insecurity - worry: Not on file  . Food insecurity - inability: Not on file  . Transportation needs - medical: Not on file  . Transportation needs - non-medical: Not on file  Occupational History  . Occupation: Disability  Tobacco Use  . Smoking status: Current Some Day Smoker    Years: 10.00    Types: Cigars  . Smokeless tobacco: Never Used  . Tobacco comment: pack per month of cigars = 5  Substance and Sexual Activity  . Alcohol use: No    Alcohol/week: 4.2 oz    Types: 7 Standard drinks or equivalent per week  . Drug use: Yes    Frequency: 5.0 times per week    Types: Marijuana    Comment: last smoked 09/22/17  . Sexual activity: No  Other Topics Concern  . Not on file  Social History Narrative   Born and raised in [place], [place]. Currently resides in a house by herself. No pets. Fun: Read, shoot pool.    Denies religious beliefs that would effect health care.    Lives at home alone.   Right-handed.   No caffeine  use.   Denies abuse and feels safe at home.     FAMILY HISTORY: Family History  Problem Relation Age of Onset  . Brain cancer Sister 73  . Lung cancer Brother 31  . Heart attack Mother   . Heart attack Father        57s  . Heart attack Maternal Uncle   . Diabetes Maternal Uncle   . Breast cancer Maternal Uncle   . Ovarian cancer Paternal Aunt        3 paternal aunts with ovarian cancer  . Heart attack Brother   . Diabetes Paternal Uncle     ALLERGIES:  is allergic to other; demerol [meperidine]; morphine and related; oxycodone; penicillins; tylenol [acetaminophen]; and ibuprofen.  MEDICATIONS:  Current Outpatient Medications  Medication Sig Dispense Refill  . amLODipine (NORVASC) 2.5 MG tablet Take 1 tablet (2.5 mg total) by mouth daily. 30 tablet 1  . docusate sodium 100 MG CAPS Take 100 mg by mouth 2 (two) times daily as needed for mild constipation. 10 capsule 0  . ondansetron (ZOFRAN) 4 MG tablet Take 1 tablet (4 mg total) by mouth every 8 (eight) hours as needed for nausea or vomiting. 20 tablet 0  . pregabalin (LYRICA) 200 MG capsule take 1 capsule by mouth every 8 hours 90 capsule 3  . acetaminophen (TYLENOL) 325 MG tablet Take 325 mg by mouth as needed.     No current facility-administered medications for this visit.     REVIEW OF SYSTEMS:   Constitutional: Denies fevers, chills or abnormal night sweats Eyes: Denies blurriness of vision, double vision or watery eyes Ears, nose, mouth, throat, and face: Denies mucositis or sore throat Respiratory: Denies cough Cardiovascular: Denies palpitation, chest discomfort or lower extremity swelling Gastrointestinal:no nausea no heartburn or change in bowel habits (+) abdominal cramping pain upon coughing or sneezing  Skin: Denies abnormal skin rashes Lymphatics: Denies new lymphadenopathy or easy bruising Neurological: (+) Peripheral neuropathy and pain on leg, improved  Behavioral/Psych: Mood is stable, no new changes    All other systems were reviewed with the patient and are negative.  PHYSICAL EXAMINATION: ECOG PERFORMANCE STATUS: 1  Vitals:   10/07/17 1454  BP: (!) 156/89  Pulse: 89  Resp: 18  Temp: 98.3 F (36.8 C)  SpO2: 100%   Filed Weights  10/07/17 1454  Weight: 187 lb 8 oz (85 kg)    GENERAL:alert, slightly distressed due to her pain SKIN: skin color, texture, turgor are normal, no rashes or significant lesions EYES: normal, conjunctiva are pink and non-injected, sclera clear OROPHARYNX:no exudate, no erythema and lips, buccal mucosa, and tongue normal  NECK: supple, thyroid normal size, non-tender, without nodularity LYMPH:  no palpable lymphadenopathy in the cervical, axillary or inguinal LUNGS: clear to auscultation and percussion with normal breathing effort HEART: regular rate & rhythm and no murmurs and no lower extremity edema ABDOMEN:abdomen soft, (+) tenderness at middle and left lower abdomen with gentle palpitation, no rebound pain, normal bowel sounds, no organmegaly. Musculoskeletal:no cyanosis of digits and no clubbing PSYCH: alert & oriented x 3 with fluent speech NEURO: no focal neuro deficits. EXT: No edema noted of the lower extremities. (+) Peripheral neuropathy. BREAST: (+) scar from breast biopsy at the right nipple. Dry, no discharge. No palpable mass in both breasts and axilla  LABORATORY DATA:  I have reviewed the data as listed CBC Latest Ref Rng & Units 10/07/2017 09/23/2017 05/20/2017  WBC 3.9 - 10.3 10e3/uL 6.1 5.5 6.6  Hemoglobin 11.6 - 15.9 g/dL 14.8 15.5(H) 15.4  Hematocrit 34.8 - 46.6 % 42.9 42.8 43.9  Platelets 145 - 400 10e3/uL 196 155 181   CMP Latest Ref Rng & Units 10/07/2017 09/23/2017 05/20/2017  Glucose 70 - 140 mg/dl 85 96 80  BUN 7.0 - 26.0 mg/dL 14.9 11 12.2  Creatinine 0.6 - 1.1 mg/dL 1.0 0.85 1.0  Sodium 136 - 145 mEq/L 141 141 140  Potassium 3.5 - 5.1 mEq/L 4.4 4.6 3.8  Chloride 101 - 111 mmol/L - 105 -  CO2 22 - 29 mEq/L 26 26  26  Calcium 8.4 - 10.4 mg/dL 9.5 9.1 9.7  Total Protein 6.4 - 8.3 g/dL 7.8 - 7.9  Total Bilirubin 0.20 - 1.20 mg/dL 0.54 - 0.44  Alkaline Phos 40 - 150 U/L 81 - 92  AST 5 - 34 U/L 15 - 16  ALT 0 - 55 U/L 7 - 8    Results for Rogers, Catherine (MRN [id number]) as of 10/05/2017 16:59  Ref. Range 03/06/2017 14:34 05/20/2017 16:30 05/20/2017 16:30  Cancer Antigen (CA) 125 Latest Ref Range: 0.0 - 38.1 U/mL  6.8   CEA Latest Ref Range: 0.0 - 4.7 ng/mL 2.3    CEA ([facility]) Latest Ref Range: 0.00 - 5.00 ng/mL 1.68  1.47  PENDING 10/07/17    Surgical Pathology of Nipple Biopsy: 04/02/17 Diagnosis Nipple Biopsy, Right - BENIGN NIPPLE TISSUE. - NO ATYPIA OR MALIGNANCY. .  PATHOLOGY REPORT 09/18/2014 Colon, segmental resection for tumor, terminal ileum and right - ULCERATED AND INVASIVE ADENOCARCINOMA SEE COMMENT. - NEGATIVE FOR LYMPH VASCULAR INVASION - NEGATIVE FOR PERINEURAL INVASION. 1 of 4 FINAL for Rogers, Catherine ([id number]) Diagnosis(continued) - TUMOR EXTENDS INTO PERICOLONIC SOFT TISSUE AND IS LESS THAN 1 MM FROM THE SEROSAL SURFACE. - TWENTY-THREE LYMPH NODES, NEGATIVE FOR TUMOR (0/23) - ONE SOFT TISSUE TUMOR DEPOSIT. - SURGICAL MARGINS, NEGATIVE FOR TUMOR. Microscopic Comment COLON AND RECTUM (INCLUDING TRANS-ANAL RESECTION): Specimen: Terminal ileum and right colon. Procedure: Right colectomy. Tumor site: Cecum. Specimen integrity: Intact. Macroscopic intactness of mesorectum: N/A Macroscopic tumor perforation: Absent. Invasive tumor: Maximum size: 4.7 cm Histologic type(s): Adenocarcinoma Histologic grade and differentiation: G2: moderately differentiated/low grade Type of polyp in which invasive carcinoma arose: Tubular adenoma with high grade dysplasia Microscopic extension of invasive tumor: Tumor extends into pericolonic soft tissue, see comment. Lymph-Vascular   invasion: Absent. Peri-neural invasion: Absent. Tumor deposit(s) (discontinuous extramural  extension): (x1) Resection margins: Proximal margin: Negative. Distal margin: Negative. Circumferential (radial) (posterior ascending, posterior descending; lateral and posterior mid-rectum; and entire lower 1/3 rectum): Negative Mesenteric margin (sigmoid and transverse): N/A Distance closest margin (if all above margins negative): 3.3 cm (distal) Trans-anal resection margins only: Deep margin: N/A Mucosal Margin: N/A Distance closest mucosal margin (if negative): N/A Treatment effect (neo-adjuvant therapy): None. Additional polyp(s): None. Non-neoplastic findings: None Lymph nodes: number examined- 23; number positive: 0 Pathologic Staging: pT3, pN1c, pMX, see comment. Ancillary studies: Per the [place] gastrointestinal working group guidelines, tumor will be submitted for both mismatch repair protein expression by immunohistochemistry and microsatellite instability testing by PCR. The results will be reported in an add on. Comment: Although tumor is not directly involving the serosa (visceral peritoneum), tumor is less than 1 mm from the serosal surface and is associated with fibroinflammatory inflammation involving the serosa. Some studies consider this finding to represent serosal involvement.  Tumor MSI: STABLE   Ovary, left and cystic mass 11/01/2014 - MIXED ENDOMETRIOID AND CLEAR CELL CARCINOMA, FIGO GRADE III/III, OVARIAN PRIMARY. - FOCAL FALLOPIAN TUBE CARCINOMA IN SITU. - SEE ONCOLOGY TABLE BELOW.  Specimen(s): Left ovary and left fallopian tube. Procedure: (including lymph node sampling) Salpingo-oophorectomy. Primary tumor site (including laterality): Left ovary. Ovarian surface involvement: Present. Ovarian capsule intact without fragmentation: Disrupted. Maximum tumor size (cm): At least 3.7 cm. Histologic type: Mixed endometrioid and clear cell adenocarcinoma. Grade: III Peritoneal implants: (specify invasive or non-invasive): Not identified. Pelvic  extension (list additional structures on separate lines and if involved): Not identified. Lymph nodes: number examined 0 ; number positive N/A TNM code: pT1c, pNX FIGO Stage (based on pathologic findings, needs clinical correlation): IC, at least. Comments: The carcinoma present in the current specimen is morphologically dissimilar from the tumor in the patient's previous colectomy, [phone]. The tumor cells in the current case are positive for cytokeratin 7 and estrogen receptor. There is faint staining for p53. Cytokeratin 20 and CDX2 are negative. 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 There is a small focus of carcinoma in situ present in fallopian tube epithelium in random tissue. Dr Claudette Laws has reviewed the case and concurs with the interpretation. The case was discussed with Dr Hulen Skains on 11/06/2014.  RADIOGRAPHIC STUDIES: I have personally reviewed the radiological images as listed and agreed with the findings in the report.   MM Right Breast 03/12/17 IMPRESSION: No mammographic or sonographic evidence of malignancy. No correlate identified for the abnormal enhancement seen within the right nipple on MRI of 02/27/2017.   CT CAP W Contrast 03/08/17 IMPRESSION: Chest Impression: 1. Stable small pulmonary nodules within LEFT lung. 2. No mediastinal lymphadenopathy. Abdomen / Pelvis Impression: 1. No evidence of metastatic colorectal cancer or ovarian cancer within the abdomen pelvis. 2. No lymphadenopathy. 3. Postsurgical change consistent RIGHT hemicolectomy and hysterectomy 4. A fem-fem arterial bypass noted.  MRI Bilateral 02/27/17 IMPRESSION: Incomplete.  MM Bilateral screening 12/24/16 IMPRESSION: No mammographic evidence of malignancy. A result letter of this screening mammogram will be mailed directly to the patient.   CT chest/abd/pelvis with contrast  08/06/2016 IMPRESSION: 1. No recurrent malignancy identified. 2. 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 On the prior exam from 09/13/2015 there was back flow of contrast in the left external iliac artery to supply the left internal iliac  artery ; this back flow of contrast is no longer present in the left external iliac artery is occluded. 3. Several pulmonary nodules at or below 4 mm diameter, unchanged. 4. 3 mm hypodensity posteriorly in the body of the pancreas, not well seen previously and subtle today, likely incidental, but merits observation. 5. 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 Prominent degenerative spurring of both acetabula. 6. Lumbar spondylosis and degenerative disc disease causing prominent impingement at L4-5 and mild impingement at L5-S1.  Ct chest, Abdomen Pelvis W Contrast 09/13/2015  IMPRESSION: 1. No evidence of metastatic disease in the chest, abdomen or pelvis. 2. Stable postsurgical changes status post right hemicolectomy and TAH-BSO, with no evidence of local tumor recurrence. 3. Patent femoral-femoral arterial bypass graft.   ASSESSMENT & PLAN:  56 y.o.  female with past medical history of arthritis, hysterectomy, history of abdominal bleeding status post embolization in 2005, who presented with abdominal pain nausea vomiting and weight loss. CT of abdomen showed a 3.8cm cecal mass with associated ileocecal intussusception, and a 9.1cm solid and cystic mass in the left pelvis,which was found to be a ovarian cancer. She is status post right hemicolectomy and terminal ileoectomy on 09/18/2014, and left Salpingo-oophorectomy on 11/01/14.   1. Right colon cancer, PT3pN1M0, stage III, MSI-stable  -I previously reviewed her CT scan and the surgical pathology findings with her in details. Based on her surgical pathology, she has stage III disease.  -Her tumor  microsatellite stability test showed stable, she unlikely has Lynch syndrome. -Althrough I previously recommended adjuvant chemotherapy, which is standard care for stage III colon cancer, her chemotherapy was postponed due to her multiple surgery, significance of pain after surgeries, and compliance issue (multiple no show), she was 5 months out of initial colon surgery when she returned for follow up after surgery, and the benefit of adjuvant chemotherapy was minimal at that point. So she did not receive adjuvant chemotherapy --We previously discussed the surveillance CT scan from 08/06/16. No recurrent malignancy identified. -I previously reviewed her surveillance CT Scan 03/08/17  which showed no evidence of recurrence. -She is 3 years since diagnoses and the risk of recurrence is much less now -She does note intermittent abdominal pain from her past surgeries. She takes extra strength tylenol to help control it.  -She is clinically doing well, lab results reviewed with her, exam was unremarkable today. No clinical concern for recurrence. - we discussed keeping up with regular f/u for colon, ovarian and  breast cancer surveillance -F/u in 6 months    2. Ovary cancer, left, pT1 cNX M0, at least stage I, mixed endometrioid and clear cell histology, grade 3, ER positive. - She underwent right oophorectomy and pelvic lymph nodes biopsy for staging, which wall over negative. -She is likely need adjuvant chemotherapy, the standard therapy is carboplatin and paclitaxel for ovarian cancer. She is close to 3 months out of her initial ovary surgery, and she developed left lower extremity neuropathy after her recent surgery, not a great candidate for chemotherapy. I previously discussed with Dr. Rossi about her adjuvant chemotherapy. Dr. Rossi felt her colon cancer is at much high risk for recurrence than ovarian cancer, and she is also quite far our from her initial ovarian cancer surgery, the benefit of  chemotherapy for ovarian cancer is very limited. Due to her limited PS and pain issue, she is also not a good candidate for chemo.  -We'll follow up CA125,  Which has been normal lately  -she   will follow up with Dr. Gehrig now, last visit in 11/2016  3. BRCA1 mutation carrier -I previously discussed her recent genetic testing results which revealed positive BRCA1 mutation. -I previously discussed extensively about the risk of breast cancer (57-65% by age of 70), and I recommend prophylactic bilateral mastectomy, but she declined. She agrees to have annual screening mammogram -Prophylactic bilateral mastectomy were discussed again today, she declined. -Her recent breast biopsy was negative. -continue annual screening breast mammogram and MRI, she agrees. -She has 5 children, all were tested, only her old son carries BRCA1 mutation  -We previously discussed chemoprevention Tamoxifen to reduce the risk of breast cancer in the future. We previously discussed the risks and benefits. The patient has declined. -Her 02/2017 mammogram was normal and we will repeat in 02/2018.  -repeat breast MRI in 08/2018  4. abdominal and left lower extremity neuropathy   -Likely related to her peripheral vascular disease and a neuropathy of the surgery. -Much improved lately, she is on Lyrica, but does not feel it helps. -Still has shooting pain through leg down to her toes. She is taking Lyrica for this.    4. PVD -she will follow up with Dr. Field   5. Coping and social support, history of depression and PTSD -She has had some difficulty coping with her synchronized colon and ovarian cancer. She lives alone, does not want her children to be involved much in her cancer care.  -her mood has much improved over time, she is coping much better lately.  6. HTN -The patient is being followed by her PCP, but the patient stopped about 2 days ago. -I previously advised the patient to speak with her PCP regarding this  and the importance of bringing her blood pressure down.   Plan -She is clinically doing well, will continue surveillance  -f/u in 6 months with lab  -Mammogram in April 2019, I will order her breast MRI on next visit  All questions were answered. The patient knows to call the clinic with any problems, questions or concerns. I spent 25 minutes counseling the patient face to face. The total time spent in the appointment was 30 minutes and more than 50% was on counseling.     Feng, Yan, MD 10/07/2017   This document serves as a record of services personally performed by Yan Feng, MD. It was created on her behalf by Amoya Bennett, a trained medical scribe. The creation of this record is based on the scribe's personal observations and the provider's statements to them.    I have reviewed the above documentation for accuracy and completeness, and I agree with the above.     

## 2017-10-07 ENCOUNTER — Ambulatory Visit (HOSPITAL_BASED_OUTPATIENT_CLINIC_OR_DEPARTMENT_OTHER): Payer: Medicare Other | Admitting: Hematology

## 2017-10-07 ENCOUNTER — Encounter: Payer: Self-pay | Admitting: Hematology

## 2017-10-07 ENCOUNTER — Other Ambulatory Visit (HOSPITAL_BASED_OUTPATIENT_CLINIC_OR_DEPARTMENT_OTHER): Payer: Medicare Other

## 2017-10-07 VITALS — BP 156/89 | HR 89 | Temp 98.3°F | Resp 18 | Ht 67.0 in | Wt 187.5 lb

## 2017-10-07 DIAGNOSIS — Z1501 Genetic susceptibility to malignant neoplasm of breast: Secondary | ICD-10-CM | POA: Diagnosis not present

## 2017-10-07 DIAGNOSIS — Z1509 Genetic susceptibility to other malignant neoplasm: Principal | ICD-10-CM

## 2017-10-07 DIAGNOSIS — C182 Malignant neoplasm of ascending colon: Secondary | ICD-10-CM

## 2017-10-07 DIAGNOSIS — Z1231 Encounter for screening mammogram for malignant neoplasm of breast: Secondary | ICD-10-CM

## 2017-10-07 DIAGNOSIS — I10 Essential (primary) hypertension: Secondary | ICD-10-CM

## 2017-10-07 DIAGNOSIS — C562 Malignant neoplasm of left ovary: Secondary | ICD-10-CM

## 2017-10-07 DIAGNOSIS — Z85038 Personal history of other malignant neoplasm of large intestine: Secondary | ICD-10-CM

## 2017-10-07 DIAGNOSIS — G629 Polyneuropathy, unspecified: Secondary | ICD-10-CM

## 2017-10-07 DIAGNOSIS — I739 Peripheral vascular disease, unspecified: Secondary | ICD-10-CM | POA: Diagnosis not present

## 2017-10-07 LAB — COMPREHENSIVE METABOLIC PANEL
ALT: 7 U/L (ref 0–55)
AST: 15 U/L (ref 5–34)
Albumin: 3.9 g/dL (ref 3.5–5.0)
Alkaline Phosphatase: 81 U/L (ref 40–150)
Anion Gap: 8 mEq/L (ref 3–11)
BUN: 14.9 mg/dL (ref 7.0–26.0)
CHLORIDE: 107 meq/L (ref 98–109)
CO2: 26 mEq/L (ref 22–29)
Calcium: 9.5 mg/dL (ref 8.4–10.4)
Creatinine: 1 mg/dL (ref 0.6–1.1)
EGFR: >60 mL/min/{1.73_m2} (ref >60)
GLUCOSE: 85 mg/dL (ref 70–140)
Potassium: 4.4 mEq/L (ref 3.5–5.1)
SODIUM: 141 meq/L (ref 136–145)
Total Bilirubin: 0.54 mg/dL (ref 0.20–1.20)
Total Protein: 7.8 g/dL (ref 6.4–8.3)

## 2017-10-07 LAB — CBC WITH DIFFERENTIAL/PLATELET
BASO%: 0.5 % (ref 0.0–2.0)
Basophils Absolute: 0 10*3/uL (ref 0.0–0.1)
EOS ABS: 0 10*3/uL (ref 0.0–0.5)
EOS%: 0.8 % (ref 0.0–7.0)
HCT: 42.9 % (ref 34.8–46.6)
HGB: 14.8 g/dL (ref 11.6–15.9)
LYMPH%: 41.1 % (ref 14.0–49.7)
MCH: 35.2 pg — ABNORMAL HIGH (ref 25.1–34.0)
MCHC: 34.6 g/dL (ref 31.5–36.0)
MCV: 101.9 fL — AB (ref 79.5–101.0)
MONO#: 0.4 10*3/uL (ref 0.1–0.9)
MONO%: 7.1 % (ref 0.0–14.0)
NEUT#: 3.1 10*3/uL (ref 1.5–6.5)
NEUT%: 50.5 % (ref 38.4–76.8)
PLATELETS: 196 10*3/uL (ref 145–400)
RBC: 4.21 10*6/uL (ref 3.70–5.45)
RDW: 13.3 % (ref 11.2–14.5)
WBC: 6.1 10*3/uL (ref 3.9–10.3)
lymph#: 2.5 10*3/uL (ref 0.9–3.3)

## 2017-10-07 LAB — CEA (IN HOUSE-CHCC): CEA (CHCC-In House): 1.65 ng/mL (ref 0.00–5.00)

## 2017-10-08 LAB — CA 125: Cancer Antigen (CA) 125: 7.7 U/mL (ref 0.0–38.1)

## 2017-10-09 ENCOUNTER — Telehealth: Payer: Self-pay

## 2017-10-09 NOTE — Telephone Encounter (Signed)
Called and informed patient of upcoming appointment per 11/16 mail out appointment schedule.

## 2017-10-21 ENCOUNTER — Ambulatory Visit: Payer: Medicare Other | Admitting: Nurse Practitioner

## 2017-10-24 DIAGNOSIS — M25552 Pain in left hip: Secondary | ICD-10-CM | POA: Diagnosis not present

## 2017-10-24 DIAGNOSIS — F17200 Nicotine dependence, unspecified, uncomplicated: Secondary | ICD-10-CM | POA: Diagnosis not present

## 2017-10-24 DIAGNOSIS — W07XXXA Fall from chair, initial encounter: Secondary | ICD-10-CM | POA: Diagnosis not present

## 2017-10-24 DIAGNOSIS — S79912A Unspecified injury of left hip, initial encounter: Secondary | ICD-10-CM | POA: Diagnosis not present

## 2017-10-24 DIAGNOSIS — S7002XA Contusion of left hip, initial encounter: Secondary | ICD-10-CM | POA: Diagnosis not present

## 2017-10-24 DIAGNOSIS — I10 Essential (primary) hypertension: Secondary | ICD-10-CM | POA: Diagnosis not present

## 2017-10-24 DIAGNOSIS — M1990 Unspecified osteoarthritis, unspecified site: Secondary | ICD-10-CM | POA: Diagnosis not present

## 2017-10-24 DIAGNOSIS — Z79899 Other long term (current) drug therapy: Secondary | ICD-10-CM | POA: Diagnosis not present

## 2017-10-29 ENCOUNTER — Encounter (HOSPITAL_COMMUNITY): Payer: Medicare Other

## 2017-10-29 ENCOUNTER — Ambulatory Visit: Payer: Medicare Other | Admitting: Family

## 2017-11-03 NOTE — Progress Notes (Signed)
Gyn Oncology Return Patient Visit  HPI:  Catherine Rogers is a 56 y.o. 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  She then underwent a exploratory laparotomy right salpingo-oophorectomy, omentectomy, pelvic and para-aortic lymph node dissection, biopsies on 12/26/14 with Dr. Denman George  Her final pathology revealed no cancer residual in the remaining specimens confirming a stage I ovarian cancer. She had a complicated history with both a T3 colon cancer and a stage IC ovarian cancer. 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   She did not receive any adjuvant chemotherapy for her ovarian cancer and is under the care of Dr. Burr Medico. She had imaging in September 2017 that revealed: IMPRESSION: 1. No recurrent malignancy identified. 2. 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 On the prior exam from 09/13/2015 there was back flow of contrast in the left external iliac artery to supply the left internal iliac artery ; this back flow of contrast is no longer present in the left externaliliac artery is occluded. 3. Several pulmonary nodules at or below 4 mm diameter, unchanged. 4. 3 mm hypodensity posteriorly in the body of the pancreas, not well seen previously and subtle today, likely incidental, but merits observation. 5. 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 Prominent degenerative spurring of both acetabula. 6. Lumbar spondylosis and degenerative disc disease causing prominent impingement at L4-5 and mild impingement at L5-S1.  She had tumor markers in December 2014 that showed a CA-125 of 6.7 and a CEA of 2.1.  She had imaging in 4/18 that revealed: IMPRESSION: Chest Impression:  1. Stable small pulmonary nodules within LEFT lung. 2. No mediastinal lymphadenopathy.  Abdomen / Pelvis Impression:  1. No  evidence of metastatic colorectal cancer or ovarian cancer within the abdomen pelvis. 2. No lymphadenopathy. 3. Postsurgical change consistent RIGHT hemicolectomy and hysterectomy 4. A fem-fem arterial bypass noted.  She also had tumor markers that revealed a CEA of 2.3 and a CA-125 of 6.4 in April 2018.  She continues to refuse mastectomies. She had a biopsy of her nipple that was abnormal on imaging and fortunately the pathology was normal and negative for malignancy.  Interval History: I last saw her 6/18 at which time her exam did not reveal any evidence of disease and her CA-125 was 6.8. She saw Dr. Burr Medico in 11/18 and her CA-125 was 7.7 and her CEA was 1.65. She has not had any breast imaging since 4/18 and is scheduled for a MMG 4/19.  She comes in today for follow-up. She is complaining of left-sided hip pain from arthritis. It does radiate down her leg. She is currently following up with her primary care physician. 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 She was told previously she with needle hip replacement but states that that is something that she would be willing to do. She does complain of some abdominal pain that feels like a spasm but additionally has it when she coughs. She does endorse nausea but no vomiting. She also has occasional hot flashes. She denies any change in her bowel or bladder habits. She is fairly active. She walks around in her yard the weather is nice but states busy walking inside the house. She prepared Thanksgiving dinner for about 28 people and we'll need to do the same  thing for Christmas.  Review of systems: Constitutional:  She has no weight gain or weight loss. She has no fever or chills. Eyes: No vision changes, wears reading glasses. Cardiovascular: No chest pain, palpitations or edema. Respiratory:  Occasional cough Gastrointestinal: Occasional constipation on the day she does not take  her stool softener. The constipation improves this and she takes it. With this constipation she feels like she is choking will have some nausea. Abdominal pain as above. Genitourinary:  She has no irregular vaginal bleeding or vaginal discharge Psychiatric:  She denies depression or anxiety.  Physical Exam: Blood pressure (!) 143/92, pulse 68, temperature 98.3 F (36.8 C), temperature source Oral, resp. rate 20, height 5' 7"  (1.702 m), weight 185 lb (83.9 kg), SpO2 100 %. General: Well dressed, well nourished in no apparent distress.    HEENT:  Normocephalic and atraumatic, no lesions. Thyroid is normal size, not nodular, midline.  Lungs:  Clear to auscultation bilaterally.  No wheezes.  Cardiovascular:  Regular rate and rhythm.    Abdomen:  Soft, + tender but distractable, nondistended.  No palpable masses.  No hepatosplenomegaly.  No ascites. Normal bowel sounds.  No hernias.  Incision is well-healed. Tenderness at the umbilicus. No obvious hernia.  Genitourinary: External genitalia within normal limits. Bimanual examination reveals no masses or nodularity. Rectal confirms. Of note, not tender with abdominal hand during pelvic exam  Extremities: No edema.   Assessment:    56 y.o. 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   S/p ex lap, RSO, lymphadenectomy, omentectomy on 12/26/14. She did not receive any adjuvant therapy for either of her malignancies. Of note, she was recently tested in April 2017 and was found to be BRCA1 positive.  Plan  She will follow-up with Dr. Burr Medico as scheduled for follow-up of her colon cancer.  She had a breast MRI in April. She had mammogram in January with repeat in May at the time of the biopsy. She'll need to continue alternating her MRIs and mammograms every 6 months. She states that she has disease exam follow-up scheduled though I cannot find the dates.  She will follow-up with her other physicians as scheduled.  We  will get a CT scan of the abdomen and pelvis secondary to the patient's fairly significant pain complaint during exam today there was distractible and not as apparent when I did her bimanual exam. Her last imaging was in April. I cannot feel a definitive hernia that her complaints of increased coughing and pain with the cough does make that somewhat suspicious in my mind. We will call her with the results from today. She will follow-up with Dr. Burr Medico is scheduled to return to see [place] in 6 months.  I did not repeat blood work today she had just had it done in November.  Smoking cessation was encouraged.   [name] A., MD

## 2017-11-04 ENCOUNTER — Ambulatory Visit: Payer: Medicare Other | Attending: Gynecologic Oncology | Admitting: Gynecologic Oncology

## 2017-11-04 ENCOUNTER — Encounter: Payer: Self-pay | Admitting: Gynecologic Oncology

## 2017-11-04 VITALS — BP 143/92 | HR 68 | Temp 98.3°F | Resp 20 | Ht 67.0 in | Wt 185.0 lb

## 2017-11-04 DIAGNOSIS — M25552 Pain in left hip: Secondary | ICD-10-CM | POA: Insufficient documentation

## 2017-11-04 DIAGNOSIS — Z85038 Personal history of other malignant neoplasm of large intestine: Secondary | ICD-10-CM | POA: Diagnosis not present

## 2017-11-04 DIAGNOSIS — C569 Malignant neoplasm of unspecified ovary: Secondary | ICD-10-CM

## 2017-11-04 DIAGNOSIS — Z1509 Genetic susceptibility to other malignant neoplasm: Secondary | ICD-10-CM | POA: Diagnosis not present

## 2017-11-04 DIAGNOSIS — F17200 Nicotine dependence, unspecified, uncomplicated: Secondary | ICD-10-CM | POA: Diagnosis not present

## 2017-11-04 DIAGNOSIS — Z8543 Personal history of malignant neoplasm of ovary: Secondary | ICD-10-CM | POA: Diagnosis not present

## 2017-11-04 DIAGNOSIS — R1030 Lower abdominal pain, unspecified: Secondary | ICD-10-CM

## 2017-11-04 DIAGNOSIS — Z1501 Genetic susceptibility to malignant neoplasm of breast: Secondary | ICD-10-CM | POA: Insufficient documentation

## 2017-11-04 NOTE — Patient Instructions (Addendum)
Plan on having a CT scan of the abdomen and pelvis to further evaluate your abdominal pain.  We will contact you with the results.  Return to see [place] in 6 months. As we discussed today we did not repeat your blood work as you have had labs with Dr. Burr Medico in November.

## 2017-11-13 ENCOUNTER — Ambulatory Visit (HOSPITAL_COMMUNITY)
Admission: RE | Admit: 2017-11-13 | Discharge: 2017-11-13 | Disposition: A | Discharge status: Home / Self Care | Payer: Medicare Other | Source: Ambulatory Visit | Attending: Gynecologic Oncology | Admitting: Gynecologic Oncology

## 2017-11-13 DIAGNOSIS — I745 Embolism and thrombosis of iliac artery: Secondary | ICD-10-CM | POA: Diagnosis not present

## 2017-11-13 DIAGNOSIS — C569 Malignant neoplasm of unspecified ovary: Secondary | ICD-10-CM

## 2017-11-13 DIAGNOSIS — Z90710 Acquired absence of both cervix and uterus: Secondary | ICD-10-CM | POA: Insufficient documentation

## 2017-11-13 DIAGNOSIS — R1030 Lower abdominal pain, unspecified: Secondary | ICD-10-CM | POA: Insufficient documentation

## 2017-11-13 DIAGNOSIS — I700 Atherosclerosis of aorta: Secondary | ICD-10-CM | POA: Diagnosis not present

## 2017-11-13 DIAGNOSIS — R109 Unspecified abdominal pain: Secondary | ICD-10-CM | POA: Diagnosis not present

## 2017-11-13 DIAGNOSIS — Z8543 Personal history of malignant neoplasm of ovary: Secondary | ICD-10-CM | POA: Insufficient documentation

## 2017-11-13 MED ORDER — IOPAMIDOL (ISOVUE-300) INJECTION 61%
100.0000 mL | Freq: Once | INTRAVENOUS | Status: AC | PRN
  Administered 2017-11-13: 100 mL via INTRAVENOUS

## 2017-11-18 ENCOUNTER — Telehealth: Payer: Self-pay | Admitting: Gynecologic Oncology

## 2017-11-18 NOTE — Telephone Encounter (Signed)
Discussed CT scan with patient.  Advised her that we would make a referral to an orthopedic for follow up on the chronic AVN seen on CT.  Also lumbar impingement noted.  She continue to report discomfort in her left leg.  Chronic occlusion of the left common iliac artery with reconstitution with femoral-femoral graft noted.  All questions answered.  Referral will be placed to [facility].  Advised she would receive a call with an appt and to call for any needs or concerns.

## 2017-12-08 ENCOUNTER — Ambulatory Visit (INDEPENDENT_AMBULATORY_CARE_PROVIDER_SITE_OTHER): Payer: Medicare Other | Admitting: *Deleted

## 2017-12-08 VITALS — BP 142/74 | HR 70 | Resp 18 | Ht 67.0 in | Wt 186.0 lb

## 2017-12-08 DIAGNOSIS — Z0000 Encounter for general adult medical examination without abnormal findings: Secondary | ICD-10-CM | POA: Diagnosis not present

## 2017-12-08 NOTE — Patient Instructions (Signed)
Continue doing brain stimulating activities (puzzles, reading, adult coloring books, staying active) to keep memory sharp.   Continue to eat heart healthy diet (full of fruits, vegetables, whole grains, lean protein, water--limit salt, fat, and sugar intake) and increase physical activity as tolerated.   Catherine Rogers , Thank you for taking time to come for your Medicare Wellness Visit. I appreciate your ongoing commitment to your health goals. Please review the following plan we discussed and let me know if I can assist you in the future.   These are the goals we discussed: Goals    . Patient Stated     Continue to be as active and as independent as possible without medications.        This is a list of the screening recommended for you and due dates:  Health Maintenance  Topic Date Due  . HIV Screening  02/02/1976  . Colon Cancer Screening  02/02/2011  . Pap Smear  12/08/2017*  . Flu Shot  02/21/2018*  . Mammogram  12/24/2018  . Tetanus Vaccine  09/04/2026  .  Hepatitis C: One time screening is recommended by Center for Disease Control  (CDC) for  adults born from [birth year] through 1965.   Completed  *Topic was postponed. The date shown is not the original due date.

## 2017-12-08 NOTE — Progress Notes (Signed)
Subjective:   Catherine Rogers is a 57 y.o. female who presents for Medicare Annual (Subsequent) preventive examination.  Review of Systems:  No ROS.  Medicare Wellness Visit. Additional risk factors are reflected in the social history.  Cardiac Risk Factors include: advanced age (>80men, >65 women);hypertension;smoking/ tobacco exposure Sleep patterns: feels rested on waking and sleeps 7 hours nightly.   Home Safety/Smoke Alarms: Feels safe in home. Smoke alarms in place.  Living environment; residence and Firearm Safety: 2-story house, no firearms. Lives with son, no needs for DME, good support system Seat Belt Safety/Bike Helmet: Wears seat belt.    Objective:     Vitals: BP (!) 142/74   Pulse 70   Resp 18   Ht 5\' 7"  (1.702 m)   Wt 186 lb (84.4 kg)   SpO2 99%   BMI 29.13 kg/m   Body mass index is 29.13 kg/m.  Advanced Directives 12/08/2017 11/04/2017 09/23/2017 05/20/2017 04/06/2017 04/02/2017 03/31/2017  Does Patient Have a Medical Advance Directive? No No No No No No No  Would patient like information on creating a medical advance directive? Yes (ED - Information included in AVS) No - Patient declined - No - Patient declined - No - Patient declined -    Tobacco Social History   Tobacco Use  Smoking Status Current Some Day Smoker  . Years: 10.00  . Types: Cigars  Smokeless Tobacco Never Used  Tobacco Comment   pack per month of cigars = 5     Ready to quit: No Counseling given: No Comment: pack per month of cigars = 5   Past Medical History:  Diagnosis Date  . Anemia   . Anxiety   . Arthritis   . Asthma   . Cancer ([place])    colon and ovarian  . Complication of anesthesia    woke and saw long tube coming out of mouth . sat up then went back to sleep  . Depression   . Ectopic pregnancy   . Family history of adverse reaction to anesthesia    sister on dialysis- hypotension   . Family history of ovarian cancer   . Head injury, closed, with brief LOC ([place])    had a cut- as a child  . Headache   . Hepatitis    being treated for Hep C  . History of blood transfusion    2005 approximately   . Hypertension    no meds now  . PAD (peripheral artery disease) ([place]) 04/18/2015  . Pelvic cyst   . PONV (postoperative nausea and vomiting)   . PTSD (post-traumatic stress disorder)    Past Surgical History:  Procedure Laterality Date  . ABDOMINAL HYSTERECTOMY    . BREAST BIOPSY Right 04/02/2017   Procedure: INCISIONAL BIOPSY RIGHT NIPPLE;  Surgeon: Coralie Keens, MD;  Location: [facility];  Service: General;  Laterality: Right;  . COLONOSCOPY    . ESOPHAGOGASTRODUODENOSCOPY N/A 09/17/2014   Procedure: ESOPHAGOGASTRODUODENOSCOPY (EGD);  Surgeon: Jeryl Columbia, MD;  Location: [facility];  Service: Endoscopy;  Laterality: N/A;  . FEMORAL-FEMORAL BYPASS GRAFT Bilateral 04/18/2015   Procedure:  RIGHT COMMON FEMORAL TO LEFT COMMON  FEMORAL  ARTERY BYPASS WITH  8 MM HEMASHIELD GRAFT,RIGHT COMMON SUPERFICIAL FEMORAL ARTERY ENDARTERECTOMY;  Surgeon: Elam Dutch, MD;  Location: [facility];  Service: Vascular;  Laterality: Bilateral;  . ILEOSTOMY Right 09/18/2014   Procedure: ILEOCOLOSTOMY;  Surgeon: Doreen Salvage, MD;  Location: [facility];  Service: General;  Laterality: Right;  . LAPAROTOMY  N/A 11/01/2014   Procedure: EXPLORATORY LAPAROTOMY;  Surgeon: Doreen Salvage, MD;  Location: [facility];  Service: General;  Laterality: N/A;  . LAPAROTOMY N/A 12/26/2014   Procedure:  LAPAROTOMY ;  Surgeon: Everitt Amber, MD;  Location: [facility];  Service: Gynecology;  Laterality: N/A;  . MASS EXCISION  11/01/2014   Procedure: EXCISION PELVIC MASS;  Surgeon: Doreen Salvage, MD;  Location: [facility];  Service: General;;  . OOPHORECTOMY  2016  . PARTIAL COLECTOMY Right 09/18/2014   Procedure: RIGHT HEMI COLECTOMY;  Surgeon: Doreen Salvage, MD;  Location: [facility];  Service: General;  Laterality: Right;  . ROBOTIC ASSISTED TOTAL HYSTERECTOMY WITH BILATERAL SALPINGO OOPHERECTOMY Right 12/26/2014    Procedure: EXPLORATORY LAPAROSCOPY  RIGHT SALPINGO OOPHORECTOMY OMENTECTOMY LYMPHADECTOMY ;  Surgeon: Everitt Amber, MD;  Location: [facility];  Service: Gynecology;  Laterality: Right;  . TUBAL LIGATION     Family History  Problem Relation Age of Onset  . Brain cancer Sister 72  . Lung cancer Brother 70  . Heart attack Mother   . Heart attack Father        74s  . Heart attack Maternal Uncle   . Diabetes Maternal Uncle   . Breast cancer Maternal Uncle   . Ovarian cancer Paternal Aunt        3 paternal aunts with ovarian cancer  . Heart attack Brother   . Diabetes Paternal Uncle    Social History   Socioeconomic History  . Marital status: Widowed    Spouse name: None  . Number of children: 6  . Years of education: 73  . Highest education level: None  Social Needs  . Financial resource strain: Not hard at all  . Food insecurity - worry: Never true  . Food insecurity - inability: Never true  . Transportation needs - medical: No  . Transportation needs - non-medical: No  Occupational History  . Occupation: Disability  Tobacco Use  . Smoking status: Current Some Day Smoker    Years: 10.00    Types: Cigars  . Smokeless tobacco: Never Used  . Tobacco comment: pack per month of cigars = 5  Substance and Sexual Activity  . Alcohol use: No    Alcohol/week: 4.2 oz    Types: 7 Standard drinks or equivalent per week  . Drug use: Yes    Frequency: 5.0 times per week    Types: Marijuana    Comment: last smoked 09/22/17  . Sexual activity: No  Other Topics Concern  . None  Social History Narrative   Born and raised in [place], [place]. Currently resides in a house by herself. No pets. Fun: Read, shoot pool.    Denies religious beliefs that would effect health care.    Lives at home alone.   Right-handed.   No caffeine use.   Denies abuse and feels safe at home.     Outpatient Encounter Medications as of 12/08/2017  Medication Sig  . acetaminophen (TYLENOL) 325 MG tablet Take 325  mg by mouth as needed.  [name] amLODipine (NORVASC) 2.5 MG tablet Take 1 tablet (2.5 mg total) by mouth daily.  [name] docusate sodium 100 MG CAPS Take 100 mg by mouth 2 (two) times daily as needed for mild constipation.  . ondansetron (ZOFRAN) 4 MG tablet Take 1 tablet (4 mg total) by mouth every 8 (eight) hours as needed for nausea or vomiting.  . pregabalin (LYRICA) 200 MG capsule take 1 capsule by mouth every 8 hours   No facility-administered  encounter medications on file as of 12/08/2017.     Activities of Daily Living In your present state of health, do you have any difficulty performing the following activities: 12/08/2017 04/02/2017  Hearing? N N  Vision? N N  Difficulty concentrating or making decisions? N N  Walking or climbing stairs? N N  Dressing or bathing? N N  Doing errands, shopping? N -  Preparing Food and eating ? N -  Using the Toilet? N -  In the past six months, have you accidently leaked urine? N -  Do you have problems with loss of bowel control? N -  Managing your Medications? N -  Managing your Finances? N -  Housekeeping or managing your Housekeeping? N -  Some recent data might be hidden    Patient Care Team: Lance Sell, NP as PCP - General (Nurse Practitioner) Truitt Merle, MD as Consulting Physician (Hematology) Judeth Horn, MD as Consulting Physician (General Surgery) Nat Math, MD as Referring Physician (Obstetrics and Gynecology) Elam Dutch, MD as Consulting Physician (Vascular Surgery) [name], MD as Attending Physician (Gynecologic Oncology)    Assessment:   This is a routine wellness examination for Lashaya. .novpe   Exercise Activities and Dietary recommendations Current Exercise Habits: Home exercise routine, Type of exercise: walking;yoga;calisthenics;strength training/weights, Time (Minutes): 40, Frequency (Times/Week): 5, Weekly Exercise (Minutes/Week): 200, Intensity: Mild, Exercise limited by: orthopedic  condition(s)  Diet (meal preparation, eat out, water intake, caffeinated beverages, dairy products, fruits and vegetables): in general, a "healthy" diet  , well balanced, eats a variety of fruits and vegetables daily, limits salt, fat/cholesterol, sugar, caffeine, drinks 6-8 glasses of water daily.  Goals    . Patient Stated     Continue to be as active and as independent as possible without medications.        Fall Risk Fall Risk  12/08/2017 12/31/2016 12/21/2015 10/22/2015 10/31/2014  Falls in the past year? No No No Yes Yes  Number falls in past yr: - - - 2 or more 2 or more  Injury with Fall? - - - No Yes  Risk for fall due to : - - - Impaired balance/gait History of fall(s);Impaired vision;Other (Comment)  Risk for fall due to: Comment - - - - dizziness  Follow up - - - Education provided;Falls prevention discussed -    Depression Screen PHQ 2/9 Scores 12/08/2017 12/31/2016 09/04/2016 10/22/2015  PHQ - 2 Score 5 0 2 3  PHQ- 9 Score 9 - 5 5     Cognitive Function       Ad8 score reviewed for issues:  Issues making decisions: no  Less interest in hobbies / activities: no  Repeats questions, stories (family complaining): no  Trouble using ordinary gadgets (microwave, computer, phone):no  Forgets the month or year: no  Mismanaging finances: no  Remembering appts: no  Daily problems with thinking and/or memory: no Ad8 score is= 0  Immunization History  Administered Date(s) Administered  . Tdap 09/04/2016   Screening Tests Health Maintenance  Topic Date Due  . PAP SMEAR  12/08/2017 (Originally 11/24/2017)  . INFLUENZA VACCINE  02/21/2018 (Originally 06/24/2017)  . COLONOSCOPY  12/08/2018 (Originally 02/02/2011)  . HIV Screening  12/08/2018 (Originally 02/02/1976)  . MAMMOGRAM  12/24/2018  . TETANUS/TDAP  09/04/2026  . Hepatitis C Screening  Completed      Plan:    Continue doing brain stimulating activities (puzzles, reading, adult coloring books, staying active)  to keep memory sharp.   Continue to  eat heart healthy diet (full of fruits, vegetables, whole grains, lean protein, water--limit salt, fat, and sugar intake) and increase physical activity as tolerated.  I have personally reviewed and noted the following in the patient's chart:   . Medical and social history . Use of alcohol, tobacco or illicit drugs  . Current medications and supplements . Functional ability and status . Nutritional status . Physical activity . Advanced directives . List of other physicians . Vitals . Screenings to include cognitive, depression, and falls . Referrals and appointments  In addition, I have reviewed and discussed with patient certain preventive protocols, quality metrics, and best practice recommendations. A written personalized care plan for preventive services as well as general preventive health recommendations were provided to patient.     Michiel Cowboy, RN  12/08/2017

## 2017-12-11 NOTE — Progress Notes (Signed)
Medical screening examination/treatment/procedure(s) were performed by the Wellness Coach, RN. As primary care provider I was immediately available for consulation/collaboration. I agree with above documentation. Ashleigh Shambley, NP  

## 2018-02-11 ENCOUNTER — Other Ambulatory Visit: Payer: Self-pay | Admitting: Vascular Surgery

## 2018-02-16 DIAGNOSIS — M87851 Other osteonecrosis, right femur: Secondary | ICD-10-CM | POA: Diagnosis not present

## 2018-02-16 DIAGNOSIS — M87059 Idiopathic aseptic necrosis of unspecified femur: Secondary | ICD-10-CM | POA: Insufficient documentation

## 2018-02-16 DIAGNOSIS — M87852 Other osteonecrosis, left femur: Secondary | ICD-10-CM | POA: Diagnosis not present

## 2018-02-17 ENCOUNTER — Telehealth: Payer: Self-pay | Admitting: Nurse Practitioner

## 2018-02-17 ENCOUNTER — Other Ambulatory Visit (HOSPITAL_COMMUNITY): Payer: Self-pay | Admitting: Orthopedic Surgery

## 2018-02-17 DIAGNOSIS — M87852 Other osteonecrosis, left femur: Secondary | ICD-10-CM

## 2018-02-17 NOTE — Telephone Encounter (Signed)
Check [place] registry last filled 01/06/2018.Marland KitchenJohny Rogers

## 2018-02-17 NOTE — Telephone Encounter (Signed)
LOV:09/17/17  Ashleigh Shambley,NP  [facility] in [place]

## 2018-02-17 NOTE — Telephone Encounter (Signed)
Pt Called stating she missed a call. Nothing documented for a call out to pt. She is out of lyrica.

## 2018-02-17 NOTE — Telephone Encounter (Signed)
Copied from [place]. Topic: Quick Communication - Rx Refill/Question >> Feb 17, 2018 10:27 AM Ahmed Prima L wrote: Medication: pregabalin (LYRICA) 200 MG capsule [[id number]] Has the patient contacted their pharmacy? Yes and they sent it to Dr Oneida Alar ([address]) (Agent: If no, request that the patient contact the pharmacy for the refill.) Preferred Pharmacy (with phone number or street name): [facility] [phone] - [place],  - [address] Agent: Please be advised that RX refills may take up to 3 business days. We ask that you follow-up with your pharmacy.

## 2018-02-18 ENCOUNTER — Telehealth: Payer: Self-pay | Admitting: Nurse Practitioner

## 2018-02-18 NOTE — Telephone Encounter (Signed)
Copied from [place] [phone]. Topic: General - Other >> Feb 18, 2018 12:42 PM Cecelia Byars, NT wrote: Reason for CRM: Patient is having hip replacement surgery ,she is  having a MRI on 03/09/18 first and then the surgery will be scheduled ,she will need home health care, a bedside commode , a walker, and possibly the lift for the toilet  please advise [phone]

## 2018-02-18 NOTE — Telephone Encounter (Signed)
We have not called pt..waiting on approval from NP on the Lyrica. Pls advise if ok to fill Ashleigh.Marland KitchenJohny Chess

## 2018-02-18 NOTE — Telephone Encounter (Signed)
Patient called to follow on request for refill  For Lyrica ,

## 2018-02-19 MED ORDER — PREGABALIN 200 MG PO CAPS: ORAL_CAPSULE | ORAL | 3 refills | Status: DC

## 2018-02-19 NOTE — Telephone Encounter (Signed)
Refill sent. Thanks

## 2018-02-19 NOTE — Telephone Encounter (Signed)
Pt called and states the pharmacy has not gotten the request for refill, call pharmacy if needed

## 2018-02-19 NOTE — Telephone Encounter (Signed)
Called pt no answer LMOM rx sent to [facility].../lmb 

## 2018-02-24 ENCOUNTER — Ambulatory Visit (HOSPITAL_COMMUNITY): Payer: Medicare Other | Attending: Orthopedic Surgery

## 2018-02-24 ENCOUNTER — Encounter (HOSPITAL_COMMUNITY): Payer: Self-pay

## 2018-02-25 ENCOUNTER — Encounter (HOSPITAL_COMMUNITY): Payer: Medicare Other

## 2018-02-25 ENCOUNTER — Ambulatory Visit: Payer: Medicare Other | Admitting: Family

## 2018-03-22 ENCOUNTER — Ambulatory Visit (HOSPITAL_COMMUNITY)
Admission: RE | Admit: 2018-03-22 | Discharge: 2018-03-22 | Disposition: A | Discharge status: Home / Self Care | Payer: Medicare Other | Source: Ambulatory Visit | Attending: Hematology | Admitting: Hematology

## 2018-03-22 ENCOUNTER — Other Ambulatory Visit: Payer: Self-pay

## 2018-03-22 ENCOUNTER — Encounter (HOSPITAL_COMMUNITY): Payer: Self-pay

## 2018-03-22 DIAGNOSIS — Z1231 Encounter for screening mammogram for malignant neoplasm of breast: Secondary | ICD-10-CM | POA: Diagnosis not present

## 2018-03-22 MED ORDER — ONDANSETRON HCL 4 MG PO TABS: 4.0000 mg | ORAL_TABLET | Freq: Three times a day (TID) | ORAL | 0 refills | Status: DC | PRN

## 2018-04-07 NOTE — Progress Notes (Signed)
[place]  [phone] [phone]  Clinic follow Up Note   Patient Care Team: Lance Sell, NP as PCP - General (Nurse Practitioner) Truitt Merle, MD as Consulting Physician (Hematology) Judeth Horn, MD as Consulting Physician (General Surgery) Nat Math, MD as Referring Physician (Obstetrics and Gynecology) Elam Dutch, MD as Consulting Physician (Vascular Surgery) [name], MD as Attending Physician (Gynecologic Oncology) 04/08/2018   CHIEF COMPLAINTS:  1. Colon cancer, pT3N1M0, stage III 2. Ovarian cancer, pT1cNxM0, stage I  3. BRCA1 mutation (+)    Cancer of right colon ([place])   09/16/2014 Imaging    CT abd/pel:  a 3.8cm cecal mass with associated ileocecal intussusception, and a 9.1cm solid and cystic mass in the left pelvis,      09/18/2014 Initial Diagnosis    Adenocarcinoma of right colon      09/18/2014 Pathologic Stage    pT3pN1Mx, tumor extends into pericolonic soft tissue and is less than 16m from the serosal surface, LVI 8-), PNI (-), all 23 node negative, one soft tissue tumor deposit, surgical margins negative.       09/18/2014 Surgery    right hemicolectomy and terminal ileoectomy      08/06/2016 Imaging    CT chest/abd/pelvis with contrast IMPRESSION: 1. No recurrent malignancy identified. 2. 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 On the prior exam from 09/13/2015 there was back flow of contrast in the left external iliac artery to supply the left internal iliac artery ; this back flow of contrast is no longer present in the left external iliac artery is occluded. 3. Several pulmonary nodules at or below 4 mm diameter, unchanged. 4. 3 mm hypodensity posteriorly in the body of the pancreas, not well seen previously and subtle today, likely incidental, but merits observation. 5. 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 Prominent degenerative spurring of both acetabula. 6. Lumbar spondylosis and degenerative disc disease causing prominent impingement at L4-5 and mild impingement at L5-S1.      03/08/2017 Imaging    CT CAP W Contrast 03/08/17 IMPRESSION: Chest Impression: 1. Stable small pulmonary nodules within LEFT lung. 2. No mediastinal lymphadenopathy. Abdomen / Pelvis Impression: 1. No evidence of metastatic colorectal cancer or ovarian cancer within the abdomen pelvis. 2. No lymphadenopathy. 3. Postsurgical change consistent RIGHT hemicolectomy and hysterectomy 4. A fem-fem arterial bypass noted.      11/13/2017 Imaging    CT AP w contrast IMPRESSION: 1. No findings of recurrent malignancy in the abdomen or pelvis. 2. Other imaging findings of potential clinical significance: Chronic AVN of both femoral heads without contour abnormality. Lumbar impingement at L4-5 and L5-S1. Aortic Atherosclerosis (ICD10-I70.0). 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 Partial right hemicolectomy. Hysterectomy.       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 ([place]   10/09/2014 Imaging    CT of the abdomen and pelvis showed a 9.1 cm solid and cystic mass in the left pelvis concerning for malignancy. This was discovered during her workup for colon cancer.      11/01/2014 Initial Diagnosis    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      11/01/2014 Surgery    Salpingo-oophorectomy.      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 Tumor size 3.7  cm, no lymph nodes removed. T1cNx. Tumor cells are positive for cytokeratin 7 and estrogen receptor.      03/08/2017 Imaging    CT CAP W Contrast 03/08/17 IMPRESSION: Chest Impression: 1. Stable small pulmonary nodules within LEFT lung. 2. No mediastinal  lymphadenopathy. Abdomen / Pelvis Impression: 1. No evidence of metastatic colorectal cancer or ovarian cancer within the abdomen pelvis. 2. No lymphadenopathy. 3. Postsurgical change consistent RIGHT hemicolectomy and hysterectomy 4. A fem-fem arterial bypass noted.      03/12/2017 Mammogram    MM Right Breast 03/12/17 IMPRESSION: No mammographic or sonographic evidence of malignancy. No correlate identified for the abnormal enhancement seen within the right nipple on MRI of 02/27/2017.       04/02/2017 Pathology Results    Surgical Pathology of Nipple Biopsy: 04/02/17 Diagnosis Nipple Biopsy, Right - BENIGN NIPPLE TISSUE. - NO ATYPIA OR MALIGNANCY.       03/22/2018 Mammogram    Screening mammogram IMPRESSION No evidence of malignancy.          HISTORY OF PRESENTING ILLNESS:  Catherine Rogers 57 y.o. female is here because of recently diagnosed colon cancer.   She presented with abdominal pain, nausea and vomiting, hemoptoses in Oct 2015.  She presented to [facility] for worsening symptoms on 09/15/2014 and was subsequently transferred to [facility]. She had a CT scan that revealed a 3.8cm cecal mass with associated ileocecal intussusception, and a 9.1cm solid and cystic mass in the left pelvis, likely a peritoneal met with ascites.  EGD was negative. She had exploratory laparoscopy on 09/18/2014 by Dr. Hulen Skains, and underwent right hemicolectomy and ileocecectomy. The pathology reviewed also related an invasive adenocarcinoma at the terminal ileum and cecum. She was discharged home on 09/22/2014.  She underwent a second surgery for left pelvis cystic mass resection on 11/01/2014, surgical path showed ovarian cancer.  CURRENT TREATMENT: Surveillance  INTERIM HISTORY: Catherine Rogers returns for follow-up. She presents to the clinic by herself. She is doing well overall.   Since her last visit to the office, she underwent a screening mammogram on 03/22/2018  with results showing: No evidence of malignancy.   She also underwent a CT AP w contrast on 11/13/2017 with results showing: No findings of recurrent malignancy in the abdomen or pelvis. Other imaging findings of potential clinical significance: Chronic AVN of both femoral heads without contour abnormality. Lumbar impingement at L4-5 and L5-S1. Aortic Atherosclerosis (ICD10-I70.0). 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 Partial right hemicolectomy. Hysterectomy.  On review of systems, she reports left hip pain, left lower back pain, nausea, left thigh pain, abdominal spasms (only with coughing or sneezing). She notes that she was advised to get a left hip surgery and she will not complete this at this time. She is using a lidocaine patch that alleviates her back pain at this time. She would like a prescription for lidocaine patches. She has had physical therapy in the past and informed that it could be contributed to fibromyalgia. she denies abdominal pain, lack of appetite, and any other symptoms. Pertinent positives are listed and detailed within the above HPI.   MEDICAL HISTORY:  Past Medical History:  Diagnosis Date  . Anemia   . Anxiety   . Arthritis   . Asthma   . Cancer ([place])    colon and ovarian  . Complication of anesthesia    woke and saw long tube coming out of mouth .  sat up then went back to sleep  . Depression   . Ectopic pregnancy   . Family history of adverse reaction to anesthesia    sister on dialysis- hypotension   . Family history of ovarian cancer   . Head injury, closed, with brief LOC ([place])    had a cut- as a child  . Headache   . Hepatitis    being treated for Hep C  . History of blood transfusion    2005 approximately   . Hypertension    no meds now  . PAD (peripheral artery disease) ([place]) 04/18/2015  . Pelvic cyst   . PONV (postoperative nausea and vomiting)   . PTSD  (post-traumatic stress disorder)      SURGICAL HISTORY: Past Surgical History:  Procedure Laterality Date  . ABDOMINAL HYSTERECTOMY    . BREAST BIOPSY Right 04/02/2017   Procedure: INCISIONAL BIOPSY RIGHT NIPPLE;  Surgeon: Coralie Keens, MD;  Location: [place];  Service: General;  Laterality: Right;  . COLONOSCOPY    . ESOPHAGOGASTRODUODENOSCOPY N/A 09/17/2014   Procedure: ESOPHAGOGASTRODUODENOSCOPY (EGD);  Surgeon: Jeryl Columbia, MD;  Location[facility];  Service: Endoscopy;  Laterality: N/A;  . FEMORAL-FEMORAL BYPASS GRAFT Bilateral 04/18/2015   Procedure:  RIGHT COMMON FEMORAL TO LEFT COMMON  FEMORAL  ARTERY BYPASS WITH  8 MM HEMASHIELD GRAFT,RIGHT COMMON SUPERFICIAL FEMORAL ARTERY ENDARTERECTOMY;  Surgeon: Elam Dutch, MD;  Location: [place];  Service: Vascular;  Laterality: Bilateral;  . ILEOSTOMY Right 09/18/2014   Procedure: ILEOCOLOSTOMY;  Surgeon: Doreen Salvage, MD;  Location: [place];  Service: General;  Laterality: Right;  . LAPAROTOMY N/A 11/01/2014   Procedure: EXPLORATORY LAPAROTOMY;  Surgeon: Doreen Salvage, MD;  Location: [place];  Service: General;  Laterality: N/A;  . LAPAROTOMY N/A 12/26/2014   Procedure:  LAPAROTOMY ;  Surgeon: Everitt Amber, MD;  Location: [facility];  Service: Gynecology;  Laterality: N/A;  . MASS EXCISION  11/01/2014   Procedure: EXCISION PELVIC MASS;  Surgeon: Doreen Salvage, MD;  Location: [place];  Service: General;;  . OOPHORECTOMY  2016  . PARTIAL COLECTOMY Right 09/18/2014   Procedure: RIGHT HEMI COLECTOMY;  Surgeon: Doreen Salvage, MD;  Location: [place];  Service: General;  Laterality: Right;  . ROBOTIC ASSISTED TOTAL HYSTERECTOMY WITH BILATERAL SALPINGO OOPHERECTOMY Right 12/26/2014   Procedure: EXPLORATORY LAPAROSCOPY  RIGHT SALPINGO OOPHORECTOMY OMENTECTOMY LYMPHADECTOMY ;  Surgeon: Everitt Amber, MD;  Location: [facility];  Service: Gynecology;  Laterality: Right;  . TUBAL LIGATION      SOCIAL HISTORY: Social History   Socioeconomic History  . Marital  status: Widowed    Spouse name: Not on file  . Number of children: 6  . Years of education: 78  . Highest education level: Not on file  Occupational History  . Occupation: Disability  Social Needs  . Financial resource strain: Not hard at all  . Food insecurity:    Worry: Never true    Inability: Never true  . Transportation needs:    Medical: No    Non-medical: No  Tobacco Use  . Smoking status: Current Some Day Smoker    Years: 10.00    Types: Cigars  . Smokeless tobacco: Never Used  . Tobacco comment: pack per month of cigars = 5  Substance and Sexual Activity  . Alcohol use: No    Alcohol/week: 4.2 oz    Types: 7 Standard drinks or equivalent per week  . Drug use: Yes    Frequency: 5.0 times per  week    Types: Marijuana    Comment: last smoked 09/22/17  . Sexual activity: Never  Lifestyle  . Physical activity:    Days per week: 5 days    Minutes per session: 50 min  . Stress: To some extent  Relationships  . Social connections:    Talks on phone: More than three times a week    Gets together: More than three times a week    Attends religious service: More than 4 times per year    Active member of club or organization: Yes    Attends meetings of clubs or organizations: More than 4 times per year    Relationship status: Widowed  . Intimate partner violence:    Fear of current or ex partner: Not on file    Emotionally abused: Not on file    Physically abused: Not on file    Forced sexual activity: Not on file  Other Topics Concern  . Not on file  Social History Narrative   Born and raised in [place], [place]. Currently resides in a house by herself. No pets. Fun: Read, shoot pool.    Denies religious beliefs that would effect health care.    Lives at home alone.   Right-handed.   No caffeine use.   Denies abuse and feels safe at home.     FAMILY HISTORY: Family History  Problem Relation Age of Onset  . Brain cancer Sister 4  . Lung cancer Brother 48    . Heart attack Mother   . Heart attack Father        76s  . Heart attack Maternal Uncle   . Diabetes Maternal Uncle   . Breast cancer Maternal Uncle   . Ovarian cancer Paternal Aunt        3 paternal aunts with ovarian cancer  . Heart attack Brother   . Diabetes Paternal Uncle     ALLERGIES:  is allergic to other; demerol [meperidine]; morphine and related; oxycodone; penicillins; tylenol [acetaminophen]; and ibuprofen.  MEDICATIONS:  Current Outpatient Medications  Medication Sig Dispense Refill  . acetaminophen (TYLENOL) 325 MG tablet Take 325 mg by mouth as needed.    [name] amLODipine (NORVASC) 2.5 MG tablet Take 1 tablet (2.5 mg total) by mouth daily. 30 tablet 1  . docusate sodium 100 MG CAPS Take 100 mg by mouth 2 (two) times daily as needed for mild constipation. 10 capsule 0  . ondansetron (ZOFRAN) 4 MG tablet Take 1 tablet (4 mg total) by mouth every 8 (eight) hours as needed for nausea or vomiting. 20 tablet 0  . lidocaine (LIDODERM) 5 % Place 1 patch onto the skin daily. Remove & Discard patch within 12 hours or as directed by MD 30 patch 1  . pregabalin (LYRICA) 200 MG capsule take 1 capsule by mouth every 8 hours 90 capsule 3   No current facility-administered medications for this visit.     REVIEW OF SYSTEMS:   Constitutional: Denies fevers, chills or abnormal night sweats Eyes: Denies blurriness of vision, double vision or watery eyes Ears, nose, mouth, throat, and face: Denies mucositis or sore throat Respiratory: Denies cough Cardiovascular: Denies palpitation, chest discomfort or lower extremity swelling Gastrointestinal:no nausea no heartburn or change in bowel habits (+) abdominal cramping pain upon coughing or sneezing  Skin: Denies abnormal skin rashes Lymphatics: Denies new lymphadenopathy or easy bruising Neurological: (+) Peripheral neuropathy and pain on leg, improved  Behavioral/Psych: Mood is stable, no new changes  All  other systems were reviewed with  the patient and are negative.  PHYSICAL EXAMINATION:  ECOG PERFORMANCE STATUS: 1  Vitals:   04/08/18 1257  BP: (!) 148/89  Pulse: 84  Resp: 18  Temp: 98.3 F (36.8 C)  SpO2: 100%   Filed Weights   04/08/18 1257  Weight: 187 lb 3.2 oz (84.9 kg)    GENERAL:alert, slightly distressed due to her pain SKIN: skin color, texture, turgor are normal, no rashes or significant lesions EYES: normal, conjunctiva are pink and non-injected, sclera clear OROPHARYNX:no exudate, no erythema and lips, buccal mucosa, and tongue normal  NECK: supple, thyroid normal size, non-tender, without nodularity LYMPH:  no palpable lymphadenopathy in the cervical, axillary or inguinal LUNGS: clear to auscultation and percussion with normal breathing effort HEART: regular rate & rhythm and no murmurs and no lower extremity edema ABDOMEN:abdomen soft, (+) Midline surgical incision to the abdomen which has healed well and there is mild tenderness to this area. no rebound pain, normal bowel sounds, no organmegaly. Musculoskeletal:no cyanosis of digits and no clubbing PSYCH: alert & oriented x 3 with fluent speech NEURO: no focal neuro deficits. EXT: No edema noted of the lower extremities. BREAST: (+) scar from breast biopsy at the right nipple. Dry, no discharge. No palpable mass in both breasts and axilla  LABORATORY DATA:  I have reviewed the data as listed CBC Latest Ref Rng & Units 04/08/2018 10/07/2017 09/23/2017  WBC 3.9 - 10.3 K/uL 5.9 6.1 5.5  Hemoglobin 11.6 - 15.9 g/dL 14.6 14.8 15.5(H)  Hematocrit 34.8 - 46.6 % 41.8 42.9 42.8  Platelets 145 - 400 K/uL 168 196 155   CMP Latest Ref Rng & Units 04/08/2018 10/07/2017 09/23/2017  Glucose 70 - 140 mg/dL 82 85 96  BUN 7 - 26 mg/dL 17 14.9 11  Creatinine 0.60 - 1.10 mg/dL 1.01 1.0 0.85  Sodium 136 - 145 mmol/L 142 141 141  Potassium 3.5 - 5.1 mmol/L 5.2(H) 4.4 4.6  Chloride 98 - 109 mmol/L 107 - 105  CO2 22 - 29 mmol/L 30(H) 26 26  Calcium 8.4 - 10.4  mg/dL 9.7 9.5 9.1  Total Protein 6.4 - 8.3 g/dL 7.8 7.8 -  Total Bilirubin 0.2 - 1.2 mg/dL 0.4 0.54 -  Alkaline Phos 40 - 150 U/L 79 81 -  AST 5 - 34 U/L 21 15 -  ALT 0 - 55 U/L 14 7 -   Results for KORYNNE, DOLS (MRN [id number]) as of 04/08/2018 17:35  Ref. Range 05/20/2017 16:30 10/07/2017 14:36 04/08/2018 12:38  Cancer Antigen (CA) 125 Latest Ref Range: 0.0 - 38.1 U/mL  7.7   CEA ([facility]) Latest Ref Range: 0.00 - 5.00 ng/mL 1.47 1.65 1.48    Surgical Pathology of Nipple Biopsy: 04/02/17 Diagnosis Nipple Biopsy, Right - BENIGN NIPPLE TISSUE. - NO ATYPIA OR MALIGNANCY. [name]  PATHOLOGY REPORT 09/18/2014 Colon, segmental resection for tumor, terminal ileum and right - ULCERATED AND INVASIVE ADENOCARCINOMA SEE COMMENT. - NEGATIVE FOR LYMPH VASCULAR INVASION - NEGATIVE FOR PERINEURAL INVASION. 1 of 4 FINAL for [place], [place] [phone]) Diagnosis(continued) - TUMOR EXTENDS INTO PERICOLONIC SOFT TISSUE AND IS LESS THAN 1 MM FROM THE SEROSAL SURFACE. - TWENTY-THREE LYMPH NODES, NEGATIVE FOR TUMOR (0/23) - ONE SOFT TISSUE TUMOR DEPOSIT. - SURGICAL MARGINS, NEGATIVE FOR TUMOR. Microscopic Comment COLON AND RECTUM (INCLUDING TRANS-ANAL RESECTION): Specimen: Terminal ileum and right colon. Procedure: Right colectomy. Tumor site: Cecum. Specimen integrity: Intact. Macroscopic intactness of mesorectum: N/A Macroscopic tumor perforation: Absent. Invasive tumor: Maximum size: 4.7 cm  Histologic type(s): Adenocarcinoma Histologic grade and differentiation: G2: moderately differentiated/low grade Type of polyp in which invasive carcinoma arose: Tubular adenoma with high grade dysplasia Microscopic extension of invasive tumor: Tumor extends into pericolonic soft tissue, see comment. Lymph-Vascular invasion: Absent. Peri-neural invasion: Absent. Tumor deposit(s) (discontinuous extramural extension): (x1) Resection margins: Proximal margin: Negative. Distal margin:  Negative. Circumferential (radial) (posterior ascending, posterior descending; lateral and posterior mid-rectum; and entire lower 1/3 rectum): Negative Mesenteric margin (sigmoid and transverse): N/A Distance closest margin (if all above margins negative): 3.3 cm (distal) Trans-anal resection margins only: Deep margin: N/A Mucosal Margin: N/A Distance closest mucosal margin (if negative): N/A Treatment effect (neo-adjuvant therapy): None. Additional polyp(s): None. Non-neoplastic findings: None Lymph nodes: number examined- 23; number positive: 0 Pathologic Staging: pT3, pN1c, pMX, see comment. Ancillary studies: Per the [place] gastrointestinal working group guidelines, tumor will be submitted for both mismatch repair protein expression by immunohistochemistry and microsatellite instability testing by PCR. The results will be reported in an add on. Comment: Although tumor is not directly involving the serosa (visceral peritoneum), tumor is less than 1 mm from the serosal surface and is associated with fibroinflammatory inflammation involving the serosa. Some studies consider this finding to represent serosal involvement.  Tumor MSI: STABLE   Ovary, left and cystic mass 11/01/2014 - MIXED ENDOMETRIOID AND CLEAR CELL CARCINOMA, FIGO GRADE III/III, OVARIAN PRIMARY. - FOCAL FALLOPIAN TUBE CARCINOMA IN SITU. - SEE ONCOLOGY TABLE BELOW.  Specimen(s): Left ovary and left fallopian tube. Procedure: (including lymph node sampling) Salpingo-oophorectomy. Primary tumor site (including laterality): Left ovary. Ovarian surface involvement: Present. Ovarian capsule intact without fragmentation: Disrupted. Maximum tumor size (cm): At least 3.7 cm. Histologic type: Mixed endometrioid and clear cell adenocarcinoma. Grade: III Peritoneal implants: (specify invasive or non-invasive): Not identified. Pelvic extension (list additional structures on separate lines and if involved): Not  identified. Lymph nodes: number examined 0 ; number positive N/A TNM code: pT1c, pNX FIGO Stage (based on pathologic findings, needs clinical correlation): IC, at least. Comments: The carcinoma present in the current specimen is morphologically dissimilar from the tumor in the patient's previous colectomy, [phone]. The tumor cells in the current case are positive for cytokeratin 7 and estrogen receptor. There is faint staining for p53. Cytokeratin 20 and CDX2 are negative. 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 There is a small focus of carcinoma in situ present in fallopian tube epithelium in random tissue. Dr Claudette Laws has reviewed the case and concurs with the interpretation. The case was discussed with Dr Hulen Skains on 11/06/2014.  RADIOGRAPHIC STUDIES: I have personally reviewed the radiological images as listed and agreed with the findings in the report.  Diagnostic right mammogram, 03/12/2018 No evidence of malignancy.   CT AP w contrast, 11/13/2017 IMPRESSION: 1. No findings of recurrent malignancy in the abdomen or pelvis. 2. Other imaging findings of potential clinical significance: Chronic AVN of both femoral heads without contour abnormality. Lumbar impingement at L4-5 and L5-S1. Aortic Atherosclerosis (ICD10-I70.0). 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 Partial right hemicolectomy. Hysterectomy.  MM Right Breast 03/12/17 IMPRESSION: No mammographic or sonographic evidence of malignancy. No correlate identified for the abnormal enhancement seen within the right nipple on MRI of 02/27/2017.   CT CAP W Contrast 03/08/17 IMPRESSION: Chest Impression: 1. Stable small pulmonary nodules within LEFT lung. 2. No mediastinal lymphadenopathy. Abdomen / Pelvis Impression: 1. No evidence of  metastatic  colorectal cancer or ovarian cancer within the abdomen pelvis. 2. No lymphadenopathy. 3. Postsurgical change consistent RIGHT hemicolectomy and hysterectomy 4. A fem-fem arterial bypass noted.  MRI Bilateral 02/27/17 IMPRESSION: Incomplete.  MM Bilateral screening 12/24/16 IMPRESSION: No mammographic evidence of malignancy. A result letter of this screening mammogram will be mailed directly to the patient.   CT chest/abd/pelvis with contrast 08/06/2016 IMPRESSION: 1. No recurrent malignancy identified. 2. 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 On the prior exam from 09/13/2015 there was back flow of contrast in the left external iliac artery to supply the left internal iliac artery ; this back flow of contrast is no longer present in the left external iliac artery is occluded. 3. Several pulmonary nodules at or below 4 mm diameter, unchanged. 4. 3 mm hypodensity posteriorly in the body of the pancreas, not well seen previously and subtle today, likely incidental, but merits observation. 5. 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 Prominent degenerative spurring of both acetabula. 6. Lumbar spondylosis and degenerative disc disease causing prominent impingement at L4-5 and mild impingement at L5-S1.  Ct chest, Abdomen Pelvis W Contrast 09/13/2015  IMPRESSION: 1. No evidence of metastatic disease in the chest, abdomen or pelvis. 2. Stable postsurgical changes status post right hemicolectomy and TAH-BSO, with no evidence of local tumor recurrence. 3. Patent femoral-femoral arterial bypass graft.   ASSESSMENT & PLAN:  57 y.o.  female with past medical history of arthritis, hysterectomy, history of abdominal bleeding status post embolization in 2005, who presented with abdominal pain nausea vomiting and weight loss. CT of abdomen showed a 3.8cm cecal mass with associated ileocecal  intussusception, and a 9.1cm solid and cystic mass in the left pelvis,which was found to be a ovarian cancer. She is status post right hemicolectomy and terminal ileoectomy on 09/18/2014, and left Salpingo-oophorectomy on 11/01/14.   1. Right colon cancer, PT3pN1M0, stage III, MSI-stable  -I previously reviewed her CT scan and the surgical pathology findings with her in details. Based on her surgical pathology, she has stage III disease.  -Her tumor microsatellite stability test showed stable, she unlikely has Lynch syndrome. -Althrough I previously recommended adjuvant chemotherapy, which is standard care for stage III colon cancer, her chemotherapy was postponed due to her multiple surgery, significance of pain after surgeries, and compliance issue (multiple no show), she was 5 months out of initial colon surgery when she returned for follow up after surgery, and the benefit of adjuvant chemotherapy was minimal at that point. So she did not receive adjuvant chemotherapy --We previously discussed the surveillance CT scan from 08/06/16. No recurrent malignancy identified. -I previously reviewed her surveillance CT Scan 03/08/17  which showed no evidence of recurrence. -She is 3.5 years since diagnoses and the risk of recurrence is much less now -She does note intermittent abdominal pain from her past surgeries. She takes extra strength tylenol to help control it.  -She is clinically doing well, lab results reviewed with her, exam was unremarkable today. No clinical concern for recurrence. - we previously discussed keeping up with regular f/u for colon, ovarian and  breast cancer surveillance -CT AP with contrast on 11/14/2017 showed: no findings of recurrent malignancy in abdomen or pelvis.  -Mammogram on 03/22/2018 with no evidence of malignancy.  -Reviewed labs and imaging studies with patient today.  -I will order a breast MRI to be completed in October 2019  -she has not had colonoscopy  after her  surgery. I will reach out to Dr. Watt Climes who completed her EGD to schedule a repeat colonoscopy.  -F/u in 6 months for lab and exam    2. Ovary cancer, left, pT1 cNX M0, at least stage I, mixed endometrioid and clear cell histology, grade 3, ER positive. - She underwent right oophorectomy and pelvic lymph nodes biopsy for staging, which wall over negative. -She is likely need adjuvant chemotherapy, the standard therapy is carboplatin and paclitaxel for ovarian cancer. She is close to 3 months out of her initial ovary surgery, and she developed left lower extremity neuropathy after her recent surgery, not a great candidate for chemotherapy. I previously discussed with Dr. Denman George about her adjuvant chemotherapy. Dr. Denman George felt her colon cancer is at much high risk for recurrence than ovarian cancer, and she is also quite far our from her initial ovarian cancer surgery, the benefit of chemotherapy for ovarian cancer is very limited. Due to her limited PS and pain issue, she is also not a good candidate for chemo.  -We'll follow up CA125,  Which has been normal lately  -she was last seen by Dr. Alycia Rossetti in 11/2016 who has left the practice  -She will follow up with one of Dr. Serita Grit partners, I will reach out to their practice   3. BRCA1 mutation carrier -I previously discussed her recent genetic testing results which revealed positive BRCA1 mutation. -I previously discussed extensively about the risk of breast cancer (57-65% by age of 65), and I recommend prophylactic bilateral mastectomy, but she declined. She agrees to have annual screening mammogram -Prophylactic bilateral mastectomy were discussed again today, she declined. -Her recent breast biopsy was negative. -continue annual screening breast mammogram and MRI, she agrees. -She has 5 children, all were tested, only her old son carries BRCA1 mutation  -We previously discussed chemoprevention Tamoxifen to reduce the risk of breast cancer in the  future. We previously discussed the risks and benefits. The patient has declined. -Her 02/2018 mammogram was normal and we will repeat in 02/2019 -repeat breast MRI in 08/2018  4. abdominal and left lower extremity neuropathy , left hip pain -Likely related to her peripheral vascular disease and a neuropathy of the surgery. -Much improved lately, she is on Lyrica, but does not feel it helps. -Still has shooting pain through leg down to her toes. She is taking Lyrica for this.  -Patient has left hip pain that has been alleviated with lidocaine patch -I will prescribe lidocaine patches for the patient left hip pain today.    4. PVD -she will follow up with Dr. Eden Lathe   5. Coping and social support, history of depression and PTSD -She has had some difficulty coping with her synchronized colon and ovarian cancer. She lives alone, does not want her children to be involved much in her cancer care.  -her mood has much improved over time, she is coping much better lately.  6. HTN -The patient is being followed by her PCP, but the patient stopped about 2 days ago. -I previously advised the patient to speak with her PCP regarding this and the importance of bringing her blood pressure down.   Plan -She is clinically doing well, will continue surveillance -Breast MRI in 5 months -F/u in 6 monts with lab  -will reach out to Dr. Lenox Ahr and or [facility] about her repeated colonoscopy   All questions were answered. The patient knows to call the clinic with any problems, questions or concerns. I spent 20  minutes counseling the patient face to face. The total time spent in the appointment was 25 minutes and more than 50% was on counseling.     Truitt Merle, MD 04/08/2018   This document serves as a record of services personally performed by Truitt Merle, MD. It was created on her behalf by Steva Colder, a trained medical scribe. The creation of this record is based on the scribe's personal observations and the  provider's statements to them.   I have reviewed the above documentation for accuracy and completeness, and I agree with the above.

## 2018-04-08 ENCOUNTER — Inpatient Hospital Stay: Payer: Medicare Other | Attending: Hematology | Admitting: Hematology

## 2018-04-08 ENCOUNTER — Inpatient Hospital Stay: Payer: Medicare Other

## 2018-04-08 ENCOUNTER — Telehealth: Payer: Self-pay

## 2018-04-08 ENCOUNTER — Encounter: Payer: Self-pay | Admitting: Hematology

## 2018-04-08 VITALS — BP 148/89 | HR 84 | Temp 98.3°F | Resp 18 | Ht 67.0 in | Wt 187.2 lb

## 2018-04-08 DIAGNOSIS — C182 Malignant neoplasm of ascending colon: Secondary | ICD-10-CM

## 2018-04-08 DIAGNOSIS — C180 Malignant neoplasm of cecum: Secondary | ICD-10-CM | POA: Diagnosis not present

## 2018-04-08 DIAGNOSIS — C562 Malignant neoplasm of left ovary: Secondary | ICD-10-CM | POA: Diagnosis not present

## 2018-04-08 DIAGNOSIS — I10 Essential (primary) hypertension: Secondary | ICD-10-CM

## 2018-04-08 DIAGNOSIS — Z1501 Genetic susceptibility to malignant neoplasm of breast: Secondary | ICD-10-CM | POA: Diagnosis not present

## 2018-04-08 DIAGNOSIS — D0739 Carcinoma in situ of other female genital organs: Secondary | ICD-10-CM | POA: Diagnosis not present

## 2018-04-08 DIAGNOSIS — F4310 Post-traumatic stress disorder, unspecified: Secondary | ICD-10-CM

## 2018-04-08 DIAGNOSIS — Z1509 Genetic susceptibility to other malignant neoplasm: Secondary | ICD-10-CM

## 2018-04-08 DIAGNOSIS — B1920 Unspecified viral hepatitis C without hepatic coma: Secondary | ICD-10-CM | POA: Diagnosis not present

## 2018-04-08 DIAGNOSIS — C7982 Secondary malignant neoplasm of genital organs: Secondary | ICD-10-CM | POA: Insufficient documentation

## 2018-04-08 DIAGNOSIS — F17210 Nicotine dependence, cigarettes, uncomplicated: Secondary | ICD-10-CM | POA: Diagnosis not present

## 2018-04-08 LAB — COMPREHENSIVE METABOLIC PANEL
ALT: 14 U/L (ref 0–55)
AST: 21 U/L (ref 5–34)
Albumin: 4.1 g/dL (ref 3.5–5.0)
Alkaline Phosphatase: 79 U/L (ref 40–150)
Anion gap: 5 (ref 3–11)
BILIRUBIN TOTAL: 0.4 mg/dL (ref 0.2–1.2)
BUN: 17 mg/dL (ref 7–26)
CO2: 30 mmol/L — ABNORMAL HIGH (ref 22–29)
CREATININE: 1.01 mg/dL (ref 0.60–1.10)
Calcium: 9.7 mg/dL (ref 8.4–10.4)
Chloride: 107 mmol/L (ref 98–109)
GFR calc Af Amer: >60 mL/min (ref >60)
Glucose, Bld: 82 mg/dL (ref 70–140)
POTASSIUM: 5.2 mmol/L — AB (ref 3.5–5.1)
Sodium: 142 mmol/L (ref 136–145)
TOTAL PROTEIN: 7.8 g/dL (ref 6.4–8.3)

## 2018-04-08 LAB — CBC WITH DIFFERENTIAL/PLATELET
BASOS PCT: 1 %
Basophils Absolute: 0 10*3/uL (ref 0.0–0.1)
EOS ABS: 0 10*3/uL (ref 0.0–0.5)
EOS PCT: 1 %
HCT: 41.8 % (ref 34.8–46.6)
HEMOGLOBIN: 14.6 g/dL (ref 11.6–15.9)
Lymphocytes Relative: 44 %
Lymphs Abs: 2.7 10*3/uL (ref 0.9–3.3)
MCH: 35.4 pg — ABNORMAL HIGH (ref 25.1–34.0)
MCHC: 35 g/dL (ref 31.5–36.0)
MCV: 101.1 fL — ABNORMAL HIGH (ref 79.5–101.0)
Monocytes Absolute: 0.6 10*3/uL (ref 0.1–0.9)
Monocytes Relative: 10 %
Neutro Abs: 2.6 10*3/uL (ref 1.5–6.5)
Neutrophils Relative %: 44 %
PLATELETS: 168 10*3/uL (ref 145–400)
RBC: 4.13 MIL/uL (ref 3.70–5.45)
RDW: 13.2 % (ref 11.2–14.5)
WBC: 5.9 10*3/uL (ref 3.9–10.3)

## 2018-04-08 LAB — CEA (IN HOUSE-CHCC): CEA (CHCC-IN HOUSE): 1.48 ng/mL (ref 0.00–5.00)

## 2018-04-08 MED ORDER — LIDOCAINE 5 % EX PTCH: 1.0000 | MEDICATED_PATCH | CUTANEOUS | 1 refills | Status: DC

## 2018-04-08 NOTE — Addendum Note (Signed)
Addended by: Truitt Merle on: 04/08/2018 06:12 PM   Modules accepted: Orders

## 2018-04-08 NOTE — Telephone Encounter (Signed)
Patient calls stating that she saw Dr. Burr Medico today and she is ordering an MRI.  Patient requests that this been done at [facility].

## 2018-04-08 NOTE — Telephone Encounter (Signed)
OK, I will change her order.   Truitt Merle MD

## 2018-04-08 NOTE — Telephone Encounter (Signed)
Prior authorization form for Lidocaine patches placed in [employer] office.

## 2018-04-08 NOTE — Telephone Encounter (Signed)
NA

## 2018-04-09 ENCOUNTER — Telehealth: Payer: Self-pay | Admitting: *Deleted

## 2018-04-09 LAB — CA 125: CANCER ANTIGEN (CA) 125: 11.5 U/mL (ref 0.0–38.1)

## 2018-04-09 NOTE — Telephone Encounter (Signed)
Spoke with scheduler at Dr. Perley Jain office and requested to speak with Dr. Perley Jain nurse.   Was informed that nurse was unavailable.  Left message for nurse re:  Per Dr. Burr Medico, pt needs colonoscopy.  Since pt had EGD in 2015, Dr. Burr Medico would like to know if Dr. Watt Climes would consider performing colonoscopy on pt.   Left direct nurse phone number for a call back. Dr. Perley Jain     Office     [phone].

## 2018-04-09 NOTE — Progress Notes (Signed)
Prior auth for lidocaine 5% patch has been submitted. Status is pending.

## 2018-04-12 NOTE — Progress Notes (Signed)
Prior auth for Lidocaine 5% patch has been approved.

## 2018-04-13 ENCOUNTER — Telehealth: Payer: Self-pay | Admitting: Hematology

## 2018-04-13 ENCOUNTER — Telehealth: Payer: Self-pay

## 2018-04-13 NOTE — Telephone Encounter (Signed)
Per Dr. Burr Medico left voice message for patient that her tumor markers CEA and ca125 were normal and she has no concerns.  Instructed patient to call back if questions or concerns.

## 2018-04-13 NOTE — Telephone Encounter (Signed)
Appointments scheduled Calendar/ Letter mailed to patient per 5/16 los

## 2018-04-13 NOTE — Telephone Encounter (Signed)
-----   Message from Truitt Merle, MD sent at 04/13/2018 12:07 PM EDT ----- Please let pt know her tumor markers CEA and ca125 were normal, no concerns. Thanks   Truitt Merle  04/13/2018

## 2018-04-16 ENCOUNTER — Telehealth: Payer: Self-pay | Admitting: *Deleted

## 2018-04-16 NOTE — Telephone Encounter (Signed)
Per staff message from Dr. Burr Medico and Lenna Sciara APP, I have scheduled the patient an appt to see Dr. Gerarda Fraction

## 2018-04-21 ENCOUNTER — Telehealth: Payer: Self-pay

## 2018-04-21 NOTE — Telephone Encounter (Signed)
Called Donnetta with [facility] back and gave her our contact phone numbers for this patient (including her son's) for them to attempt to schedule her colonoscopy with Dr. Watt Climes.

## 2018-05-03 NOTE — Progress Notes (Signed)
Progress Note: Gyn-Onc   CC:  Chief Complaint  Patient presents with  . BRCA1 positive   BRCA1 mutation H/o Colon CA H/o early stage Ovary CA  HPI: [name]  is a very nice 57 y.o.    . Interval History Recall She had a complicated history with both a T3 colon cancer and a stage IC ovarian cancer. 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   She last visited here in [place] 10/2017 with Dr. Alycia Rossetti. She is now almost 3.5 years from her diagnosis. She saw Dr. Burr Medico 03/2018    . History of Presenting Ilness:  She presented with abdominal pain, nausea and vomiting, hemoptoses in Oct 2015.  She presented to [facility] for worsening symptoms on 09/15/2014 and was subsequently transferred to [facility]. She had a CT scan that revealed a 3.8cm cecal mass with associated ileocecal intussusception, and a 9.1cm solid and cystic mass in the left pelvis, likely a peritoneal met with ascites.  EGD was negative. She had exploratory laparoscopy on 09/18/2014 by Dr. Hulen Skains, and underwent right hemicolectomy and ileocecectomy. The pathology reviewed also related an invasive adenocarcinoma at the terminal ileum and cecum. She was discharged home on 09/22/2014.  She underwent a second surgery with Dr.Wyatt for a left pelvis cystic mass resection on 11/01/2014, surgical path showed ovarian cancer.  Seen in consultation here on 12/08/2014 for the incidentally found left endometrioid and clear cell adenocarcinoma of the ovary.  She then underwent a exploratory laparotomy right salpingo-oophorectomy, omentectomy, pelvic and para-aortic lymph node dissection, biopsies on 12/26/14 with Dr. Denman George. Her final pathology from this completion surgery revealed no cancer residual in the remaining specimens confirming a stage I ovarian cancer.   She did not receive any adjuvant chemotherapy for her ovarian cancer and is under the care of Dr. Burr Medico.     She also had tumor markers that  revealed a CEA of 2.3 and a CA-125 of 6.4 in April 2018.  She continues to refuse mastectomies. She had a biopsy of her nipple that was abnormal on imaging and fortunately the pathology was normal and negative for malignancy.  Measurement of disease:  Recent Labs    05/20/17 1630 10/07/17 1436 04/08/18 1238  CAN125 6.8 7.7 11.5    She had tumor markers in December 2014 that showed a CA-125 of 6.7 and a CEA of 2.1.  Radiology:  September 2017 that revealed: IMPRESSION: 1. No recurrent malignancy identified. 2. 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 On the prior exam from 09/13/2015 there was back flow of contrast in the left external iliac artery to supply the left internal iliac artery ; this back flow of contrast is no longer present in the left externaliliac artery is occluded. 3. Several pulmonary nodules at or below 4 mm diameter, unchanged. 4. 3 mm hypodensity posteriorly in the body of the pancreas, not well seen previously and subtle today, likely incidental, but merits observation. 5. 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 Prominent degenerative spurring of both acetabula. 6. Lumbar spondylosis and degenerative disc disease causing prominent impingement at L4-5 and mild impingement at L5-S1.  02/2017 that revealed: IMPRESSION: Chest Impression: 1. Stable small pulmonary nodules within LEFT lung. 2. No mediastinal lymphadenopathy.  Abdomen / Pelvis Impression: 1. No evidence of metastatic colorectal cancer or ovarian cancer within the abdomen pelvis. 2. No lymphadenopathy. 3. Postsurgical change consistent RIGHT hemicolectomy and  hysterectomy 4. A fem-fem arterial bypass noted.  10/2017 CT AP IMPRESSION: 1. No findings of recurrent malignancy in the abdomen or pelvis. 2. Other imaging findings of potential clinical significance: Chronic AVN of both femoral heads without  contour abnormality. Lumbar impingement at L4-5 and L5-S1. Aortic Atherosclerosis (ICD10-I70.0). 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 Partial right hemicolectomy. Hysterectomy  No results found. in last 3 months  The following portions of the patient's history were reviewed and updated as appropriate: allergies, current medications, past family history, past medical history, past social history, past surgical history and problem list.   Current Meds:  Outpatient Encounter Medications as of 05/07/2018  Medication Sig  . acetaminophen (TYLENOL) 325 MG tablet Take 325 mg by mouth as needed.  . docusate sodium 100 MG CAPS Take 100 mg by mouth 2 (two) times daily as needed for mild constipation.  . lidocaine (LIDODERM) 5 % Place 1 patch onto the skin daily. Remove & Discard patch within 12 hours or as directed by MD  . ondansetron (ZOFRAN) 4 MG tablet Take 1 tablet (4 mg total) by mouth every 8 (eight) hours as needed for nausea or vomiting.  . pregabalin (LYRICA) 200 MG capsule take 1 capsule by mouth every 8 hours  . [DISCONTINUED] amLODipine (NORVASC) 2.5 MG tablet Take 1 tablet (2.5 mg total) by mouth daily. (Patient not taking: Reported on 05/07/2018)   No facility-administered encounter medications on file as of 05/07/2018.     Allergy:  Allergies  Allergen Reactions  . Other Hives and Itching    Highly acidic foods "make me itch and break out" oranges, tomatoes  . Demerol [Meperidine] Nausea And Vomiting  . Morphine And Related Nausea And Vomiting  . Oxycodone Itching  . Penicillins Other (See Comments)    Childhood allergy  . Tylenol [Acetaminophen] Nausea And Vomiting  . Ibuprofen Nausea And Vomiting    Social Hx:   Social History   Socioeconomic History  . Marital status: Widowed    Spouse name: Not on file  . Number of children: 6  . Years of education: 34  . Highest education  level: Not on file  Occupational History  . Occupation: Disability  Social Needs  . Financial resource strain: Not hard at all  . Food insecurity:    Worry: Never true    Inability: Never true  . Transportation needs:    Medical: No    Non-medical: No  Tobacco Use  . Smoking status: Current Some Day Smoker    Years: 10.00    Types: Cigars  . Smokeless tobacco: Never Used  . Tobacco comment: pack per month of cigars = 5  Substance and Sexual Activity  . Alcohol use: No    Alcohol/week: 4.2 oz    Types: 7 Standard drinks or equivalent per week  . Drug use: Yes    Frequency: 5.0 times per week    Types: Marijuana    Comment: last smoked 09/22/17  . Sexual activity: Never  Lifestyle  . Physical activity:    Days per week: 5 days    Minutes per session: 50 min  . Stress: To some extent  Relationships  . Social connections:    Talks on phone: More than three times a week    Gets together: More than three times a week    Attends religious service: More than 4 times per year    Active member of  club or organization: Yes    Attends meetings of clubs or organizations: More than 4 times per year    Relationship status: Widowed  . Intimate partner violence:    Fear of current or ex partner: Not on file    Emotionally abused: Not on file    Physically abused: Not on file    Forced sexual activity: Not on file  Other Topics Concern  . Not on file  Social History Narrative   Born and raised in [place], [place]. Currently resides in a house by herself. No pets. Fun: Read, shoot pool.    Denies religious beliefs that would effect health care.    Lives at home alone.   Right-handed.   No caffeine use.   Denies abuse and feels safe at home.     Past Surgical Hx:  Past Surgical History:  Procedure Laterality Date  . ABDOMINAL HYSTERECTOMY    . BREAST BIOPSY Right 04/02/2017   Procedure: INCISIONAL BIOPSY RIGHT NIPPLE;  Surgeon: Coralie Keens, MD;  Location: [facility];  Service: General;  Laterality: Right;  . COLONOSCOPY    . ESOPHAGOGASTRODUODENOSCOPY N/A 09/17/2014   Procedure: ESOPHAGOGASTRODUODENOSCOPY (EGD);  Surgeon: Jeryl Columbia, MD;  Location: [facility];  Service: Endoscopy;  Laterality: N/A;  . FEMORAL-FEMORAL BYPASS GRAFT Bilateral 04/18/2015   Procedure:  RIGHT COMMON FEMORAL TO LEFT COMMON  FEMORAL  ARTERY BYPASS WITH  8 MM HEMASHIELD GRAFT,RIGHT COMMON SUPERFICIAL FEMORAL ARTERY ENDARTERECTOMY;  Surgeon: Elam Dutch, MD;  Location: [facility];  Service: Vascular;  Laterality: Bilateral;  . ILEOSTOMY Right 09/18/2014   Procedure: ILEOCOLOSTOMY;  Surgeon: Doreen Salvage, MD;  Location: [place];  Service: General;  Laterality: Right;  . LAPAROTOMY N/A 11/01/2014   Procedure: EXPLORATORY LAPAROTOMY;  Surgeon: Doreen Salvage, MD;  Location: [facility];  Service: General;  Laterality: N/A;  . LAPAROTOMY N/A 12/26/2014   Procedure:  LAPAROTOMY ;  Surgeon: Everitt Amber, MD;  Location: [facility];  Service: Gynecology;  Laterality: N/A;  . MASS EXCISION  11/01/2014   Procedure: EXCISION PELVIC MASS;  Surgeon: Doreen Salvage, MD;  Location: [place];  Service: General;;  . OOPHORECTOMY  2016  . PARTIAL COLECTOMY Right 09/18/2014   Procedure: RIGHT HEMI COLECTOMY;  Surgeon: Doreen Salvage, MD;  Location: [place];  Service: General;  Laterality: Right;  . ROBOTIC ASSISTED TOTAL HYSTERECTOMY WITH BILATERAL SALPINGO OOPHERECTOMY Right 12/26/2014   Procedure: EXPLORATORY LAPAROSCOPY  RIGHT SALPINGO OOPHORECTOMY OMENTECTOMY LYMPHADECTOMY ;  Surgeon: Everitt Amber, MD;  Location: [facility];  Service: Gynecology;  Laterality: Right;  . TUBAL LIGATION      Past Medical Hx:  Past Medical History:  Diagnosis Date  . Anemia   . Anxiety   . Arthritis   . Asthma   . Cancer ([place])    colon and ovarian  . Complication of anesthesia    woke and saw long tube coming out of mouth . sat up then went back to sleep  . Depression   . Ectopic pregnancy   . Family history of adverse reaction to  anesthesia    sister on dialysis- hypotension   . Family history of ovarian cancer   . Head injury, closed, with brief LOC ([place])    had a cut- as a child  . Headache   . Hepatitis    being treated for Hep C  . History of blood transfusion    2005 approximately   . Hypertension    no meds now  .  PAD (peripheral artery disease) ([place]) 04/18/2015  . Pelvic cyst   . PONV (postoperative nausea and vomiting)   . PTSD (post-traumatic stress disorder)      Family Hx:  Family History  Problem Relation Age of Onset  . Brain cancer Sister 5  . Lung cancer Brother 53  . Heart attack Mother   . Heart attack Father        42s  . Heart attack Maternal Uncle   . Diabetes Maternal Uncle   . Breast cancer Maternal Uncle   . Ovarian cancer Paternal Aunt        3 paternal aunts with ovarian cancer  . Heart attack Brother   . Diabetes Paternal Uncle     Review of Systems:  Review of Systems  Constitutional: Positive for chills.  Respiratory: Positive for chest tightness.   All other systems reviewed and are negative.   Vitals:  Blood pressure (!) 165/97, pulse 78, temperature 98.5 F (36.9 C), temperature source Oral, resp. rate 20, height _0  (1.702 m), weight 183 lb (83 kg), SpO2 100 %. Body mass index is 28.66 kg/m.  Physical Exam: ECOG PERFORMANCE STATUS: 0 - Asymptomatic   General :  Well developed, 57 y.o., female in no apparent distress HEENT:  Normocephalic/atraumatic, symmetric, EOMI, eyelids normal Neck:   Supple, no masses.  Lymphatics:  No cervical/ submandibular/ supraclavicular/ infraclavicular/ inguinal adenopathy Respiratory:  Respirations unlabored, no use of accessory muscles CV:   Deferred Breast:  Deferred Musculoskeletal: No CVA tenderness, normal muscle strength. Abdomen:  Soft, non-tender and nondistended. No evidence of hernia. No masses. Extremities:  No lymphedema, no erythema, non-tender. Skin:   Normal inspection Neuro/Psych:  No focal motor  deficit, no abnormal mental status. Normal gait. Normal affect. Alert and oriented to person, place, and time  Genito Urinary: Vulva: Normal external female genitalia.  Bladder/urethra: Urethral meatus normal in size and location. No lesions or   masses, well supported bladder Speculum exam: Vagina: No lesion, no discharge, no bleeding. Bimanual exam: Cervix/Uterus/Adnexa: Surgically absent  Adnexa: No masses. Rectovaginal:  Good tone, no masses, no cul de sac nodularity, no parametrial involvement or nodularity.   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   S/p ex lap, RSO, lymphadenectomy, omentectomy on 12/26/14. She did not receive any adjuvant therapy for either of her malignancies. Of note, she was recently tested in April 2017 and was found to be BRCA1 positive.  Plan 1. Colon CA  She will follow-up with Dr. Burr Medico as scheduled for follow-up of her colon cancer. 2. BRCA mutation  Breast screening per Dr. Burr Medico 3. Surveillance for ovarian cancer  Now 3.5 years NED (no chemo for ovary primary)   Continue Q6 month pelvic and CA125  I did not repeat blood work today as she just had this a month ago. Dr. Burr Medico looks to be obtaining during her visits. 4. Re-Refer to ortho for AVN and hip/leg pain. She had 2 deaths in the family that prevented her from following through with the original referral.   Isabel Caprice, MD  05/07/2018, 12:16 PM  Cc: Lance Sell, NP

## 2018-05-07 ENCOUNTER — Encounter: Payer: Self-pay | Admitting: Obstetrics

## 2018-05-07 ENCOUNTER — Inpatient Hospital Stay: Payer: Medicare Other | Attending: Hematology | Admitting: Obstetrics

## 2018-05-07 VITALS — BP 165/97 | HR 78 | Temp 98.5°F | Resp 20 | Ht 67.0 in | Wt 183.0 lb

## 2018-05-07 DIAGNOSIS — C189 Malignant neoplasm of colon, unspecified: Secondary | ICD-10-CM | POA: Diagnosis not present

## 2018-05-07 DIAGNOSIS — Z1501 Genetic susceptibility to malignant neoplasm of breast: Secondary | ICD-10-CM

## 2018-05-07 DIAGNOSIS — Z90710 Acquired absence of both cervix and uterus: Secondary | ICD-10-CM | POA: Diagnosis not present

## 2018-05-07 DIAGNOSIS — C569 Malignant neoplasm of unspecified ovary: Secondary | ICD-10-CM | POA: Diagnosis not present

## 2018-05-07 DIAGNOSIS — Z1509 Genetic susceptibility to other malignant neoplasm: Secondary | ICD-10-CM

## 2018-05-07 DIAGNOSIS — Z90722 Acquired absence of ovaries, bilateral: Secondary | ICD-10-CM | POA: Diagnosis not present

## 2018-05-07 NOTE — Patient Instructions (Signed)
1. Return in 6 months for a pelvic 2. Ortho referral will be placed again

## 2018-05-11 ENCOUNTER — Telehealth: Payer: Self-pay | Admitting: Oncology

## 2018-05-11 NOTE — Telephone Encounter (Signed)
Called Lian and notified her of appointment with Dr. Erline Levine on 05/18/18 at 9:45.  She verbalized agreement but then said she needs and x-ray before she sees the doctor.

## 2018-05-12 NOTE — Telephone Encounter (Signed)
Left a message for patient advising her that Dr. Erline Levine will order any xrays that she will need.

## 2018-05-17 DIAGNOSIS — M87059 Idiopathic aseptic necrosis of unspecified femur: Secondary | ICD-10-CM | POA: Insufficient documentation

## 2018-05-19 ENCOUNTER — Other Ambulatory Visit: Payer: Self-pay | Admitting: Orthopedic Surgery

## 2018-05-19 DIAGNOSIS — M87852 Other osteonecrosis, left femur: Secondary | ICD-10-CM

## 2018-05-24 ENCOUNTER — Ambulatory Visit (INDEPENDENT_AMBULATORY_CARE_PROVIDER_SITE_OTHER): Payer: Medicare Other | Admitting: Family

## 2018-05-24 ENCOUNTER — Other Ambulatory Visit: Payer: Self-pay

## 2018-05-24 ENCOUNTER — Ambulatory Visit (HOSPITAL_COMMUNITY)
Admission: RE | Admit: 2018-05-24 | Discharge: 2018-05-24 | Disposition: A | Discharge status: Home / Self Care | Payer: Medicare Other | Source: Ambulatory Visit | Attending: Family | Admitting: Family

## 2018-05-24 ENCOUNTER — Encounter: Payer: Self-pay | Admitting: Family

## 2018-05-24 VITALS — BP 139/98 | HR 68 | Resp 18 | Ht 67.0 in | Wt 180.0 lb

## 2018-05-24 DIAGNOSIS — F17200 Nicotine dependence, unspecified, uncomplicated: Secondary | ICD-10-CM | POA: Diagnosis not present

## 2018-05-24 DIAGNOSIS — Z95828 Presence of other vascular implants and grafts: Secondary | ICD-10-CM

## 2018-05-24 DIAGNOSIS — I779 Disorder of arteries and arterioles, unspecified: Secondary | ICD-10-CM

## 2018-05-24 DIAGNOSIS — G90522 Complex regional pain syndrome I of left lower limb: Secondary | ICD-10-CM

## 2018-05-24 DIAGNOSIS — I10 Essential (primary) hypertension: Secondary | ICD-10-CM | POA: Insufficient documentation

## 2018-05-24 NOTE — Patient Instructions (Signed)
Steps to Quit Smoking Smoking tobacco can be bad for your health. It can also affect almost every organ in your body. Smoking puts you and people around you at risk for many serious long-lasting (chronic) diseases. Quitting smoking is hard, but it is one of the best things that you can do for your health. It is never too late to quit. What are the benefits of quitting smoking? When you quit smoking, you lower your risk for getting serious diseases and conditions. They can include:  Lung cancer or lung disease.  Heart disease.  Stroke.  Heart attack.  Not being able to have children (infertility).  Weak bones (osteoporosis) and broken bones (fractures).  If you have coughing, wheezing, and shortness of breath, those symptoms may get better when you quit. You may also get sick less often. If you are pregnant, quitting smoking can help to lower your chances of having a baby of low birth weight. What can I do to help me quit smoking? Talk with your doctor about what can help you quit smoking. Some things you can do (strategies) include:  Quitting smoking totally, instead of slowly cutting back how much you smoke over a period of time.  Going to in-person counseling. You are more likely to quit if you go to many counseling sessions.  Using resources and support systems, such as: ? Online chats with a counselor. ? Phone quitlines. ? Printed self-help materials. ? Support groups or group counseling. ? Text messaging programs. ? Mobile phone apps or applications.  Taking medicines. Some of these medicines may have nicotine in them. If you are pregnant or breastfeeding, do not take any medicines to quit smoking unless your doctor says it is okay. Talk with your doctor about counseling or other things that can help you.  Talk with your doctor about using more than one strategy at the same time, such as taking medicines while you are also going to in-person counseling. This can help make  quitting easier. What things can I do to make it easier to quit? Quitting smoking might feel very hard at first, but there is a lot that you can do to make it easier. Take these steps:  Talk to your family and friends. Ask them to support and encourage you.  Call phone quitlines, reach out to support groups, or work with a counselor.  Ask people who smoke to not smoke around you.  Avoid places that make you want (trigger) to smoke, such as: ? Bars. ? Parties. ? Smoke-break areas at work.  Spend time with people who do not smoke.  Lower the stress in your life. Stress can make you want to smoke. Try these things to help your stress: ? Getting regular exercise. ? Deep-breathing exercises. ? Yoga. ? Meditating. ? Doing a body scan. To do this, close your eyes, focus on one area of your body at a time from head to toe, and notice which parts of your body are tense. Try to relax the muscles in those areas.  Download or buy apps on your mobile phone or tablet that can help you stick to your quit plan. There are many free apps, such as QuitGuide from the CDC (Centers for Disease Control and Prevention). You can find more support from smokefree.gov and other websites.  This information is not intended to replace advice given to you by your health care provider. Make sure you discuss any questions you have with your health care provider. Document Released: 09/06/2009 Document   Revised: 07/08/2016 Document Reviewed: 03/27/2015 Elsevier Interactive Patient Education  2018 Elsevier Inc.     Peripheral Vascular Disease Peripheral vascular disease (PVD) is a disease of the blood vessels that are not part of your heart and brain. A simple term for PVD is poor circulation. In most cases, PVD narrows the blood vessels that carry blood from your heart to the rest of your body. This can result in a decreased supply of blood to your arms, legs, and internal organs, like your stomach or kidneys.  However, it most often affects a person's lower legs and feet. There are two types of PVD.  Organic PVD. This is the more common type. It is caused by damage to the structure of blood vessels.  Functional PVD. This is caused by conditions that make blood vessels contract and tighten (spasm).  Without treatment, PVD tends to get worse over time. PVD can also lead to acute ischemic limb. This is when an arm or limb suddenly has trouble getting enough blood. This is a medical emergency. Follow these instructions at home:  Take medicines only as told by your doctor.  Do not use any tobacco products, including cigarettes, chewing tobacco, or electronic cigarettes. If you need help quitting, ask your doctor.  Lose weight if you are overweight, and maintain a healthy weight as told by your doctor.  Eat a diet that is low in fat and cholesterol. If you need help, ask your doctor.  Exercise regularly. Ask your doctor for some good activities for you.  Take good care of your feet. ? Wear comfortable shoes that fit well. ? Check your feet often for any cuts or sores. Contact a doctor if:  You have cramps in your legs while walking.  You have leg pain when you are at rest.  You have coldness in a leg or foot.  Your skin changes.  You are unable to get or have an erection (erectile dysfunction).  You have cuts or sores on your feet that are not healing. Get help right away if:  Your arm or leg turns cold and blue.  Your arms or legs become red, warm, swollen, painful, or numb.  You have chest pain or trouble breathing.  You suddenly have weakness in your face, arm, or leg.  You become very confused or you cannot speak.  You suddenly have a very bad headache.  You suddenly cannot see. This information is not intended to replace advice given to you by your health care provider. Make sure you discuss any questions you have with your health care provider. Document Released:  02/04/2010 Document Revised: 04/17/2016 Document Reviewed: 04/20/2014 Elsevier Interactive Patient Education  2017 Elsevier Inc.  

## 2018-05-24 NOTE — Progress Notes (Signed)
[facility] [place]   CC: Follow up peripheral artery occlusive disease  History of Present Illness Catherine Rogers is a 57 y.o. female who is s/p right to left femoral-femoral bypass on 04/18/2015 by Dr. Oneida Alar. This was done primarily for rest pain in the left foot. She continues to have pain in the left foot despite revascularization. She also still has some edema in the left foot. She states that the foot is 70% better than preoperatively.  Left foot x-ray showed no evidence of fracture but demineralization possibly reflex sympathetic dystrophy (RSD).  She has weaned off narcotic pain medication for her left foot. She states that her foot hurts day and night and feels cool, Lyrica is helping this. She has also noticed changes in the shape of her foot due to changes in her gait secondary to pain.  Dr. Oneida Alar saw pt on 09/13/15. At that time patient had a duplex ultrasound 1 month prior of her femorofemoral bypass which suggested possible inflow narrowing of the femorofemoral. She had a CT angiogram of the abdomen and pelvis with lower extremity runoff that day. This showeda widely patent femoral-femoral bypass with completely intact runoff to the left lower extremity without any narrowing of her lower extremity arteries. She did have mild narrowing of the right superficial femoral artery. Persistent left foot pain despite revascularization. Recent bone scan suggested reflux sympathetic dystrophy. I do not believe all of her current pain symptoms are related to vascular disease. She has previously had a neurologic evaluation which suggests that most of this pain probably is neurologic in origin from her previous extensive operation for ovarian and colon cancer. She had an extensive workup by neurology in May of 2016. She has had a referral to the pain clinic in May 2016, August 2016 and from our office in October 2016. Dr. Oneida Alar called the pain Center in October 2016 at  [phone] and spoke with their manager. Dr. Oneida Alar did not prescribe any narcotic pain medication at the October 2016 visit as he does not believe her pain symptoms were related to peripheral arterial disease. She was to follow-up with [place] in 6 months for repeat ABIs.   Dr. Oneida Alar last evaluated pt on 09-24-17. At that time  abdomen: Soft mildly tender diffusely at baseline 2+ graft pulse in her femoral-femoral bypass. Extremities: Symmetrically cool feet no palpable pedal pulses. Bilateral ABIs were 0.75 on the left 0.76 on the right unchanged from 2016. Complex regional pain syndrome left foot exacerbated by recent medication change. Dr. Oneida Alar did not believe that her pain symptoms were related to peripheral arterial disease as her ABIs had not changed over the previous 2 years and her symptoms had been stable. Pt was to follow-up with [place] and see our nurse practitioner in 6 months with bilateral ABIs. Dr. Oneida Alar reordered Lyrica 200 mg 3 times a day and discontinued nortriptyline; he forwarded this information to her primary care provider.  She returns today for 6 months follow up. Pt states she did have an appointment with the pain clinic, states she did not want to take any prescription pain medications and is not attending any longer.  She takes Lyrica, occasional Extra Strength Tylenol.  She states she was dehydrated when she presented for right breast biopsy on 04-02-17, states she needed IV rehydration, and her left side felt better after this.  She states she drinks about 85 oz of water daily and voids a great deal. Her CMP in April 2018 was all WNL.  She states she has nerve damage since the ovarian cancer surgery, has left leg and abdominal pain since then.    Pt has multiple complaints.  She has tests scheduled tomorrow with ortho for left hip pain.  She was in the heat yesterday at a family gathering, on the way home she started having dyspnea, gagging, and diarrhea. Diarrhea  has resolved, certain smells trigger worsening dyspnea.   She states she has transportation van to bring her home, states she will take a breathing tx at home., then have her family take her [facility] ED.   After walking about 150 feet, entire left leg is painful.     Pt Diabetic: No Pt smoker: states she smokes 1 cigar/day, currently smokes marijuana, 1-2 joints per day, which helps with the abdominal and left leg pain and helps her sleep.  Pt meds include: Statin :No Betablocker: No ASA: No Other anticoagulants/antiplatelets: no     Past Medical History:  Diagnosis Date  . Anemia   . Anxiety   . Arthritis   . Asthma   . Cancer ([place])    colon and ovarian  . Complication of anesthesia    woke and saw long tube coming out of mouth . sat up then went back to sleep  . Depression   . Ectopic pregnancy   . Family history of adverse reaction to anesthesia    sister on dialysis- hypotension   . Family history of ovarian cancer   . Head injury, closed, with brief LOC ([place])    had a cut- as a child  . Headache   . Hepatitis    being treated for Hep C  . History of blood transfusion    2005 approximately   . Hypertension    no meds now  . PAD (peripheral artery disease) ([place]) 04/18/2015  . Pelvic cyst   . PONV (postoperative nausea and vomiting)   . PTSD (post-traumatic stress disorder)     Social History Social History   Tobacco Use  . Smoking status: Current Some Day Smoker    Years: 10.00    Types: Cigars  . Smokeless tobacco: Never Used  . Tobacco comment: pack per month of cigars = 5  Substance Use Topics  . Alcohol use: No    Alcohol/week: 4.2 oz    Types: 7 Standard drinks or equivalent per week  . Drug use: Yes    Frequency: 5.0 times per week    Types: Marijuana    Comment: last smoked 09/22/17    Family History Family History  Problem Relation Age of Onset  . Brain cancer Sister 45  . Lung cancer Brother 71  . Heart attack Mother   . Heart  attack Father        32s  . Heart disease Father   . Heart attack Maternal Uncle   . Diabetes Maternal Uncle   . Breast cancer Maternal Uncle   . Ovarian cancer Paternal Aunt        3 paternal aunts with ovarian cancer  . Heart attack Brother   . Diabetes Paternal Uncle     Past Surgical History:  Procedure Laterality Date  . ABDOMINAL HYSTERECTOMY    . BREAST BIOPSY Right 04/02/2017   Procedure: INCISIONAL BIOPSY RIGHT NIPPLE;  Surgeon: Coralie Keens, MD;  Location: [place];  Service: General;  Laterality: Right;  . COLONOSCOPY    . ESOPHAGOGASTRODUODENOSCOPY N/A 09/17/2014   Procedure: ESOPHAGOGASTRODUODENOSCOPY (EGD);  Surgeon: Jeryl Columbia,  MD;  Location: [place] [facility];  Service: Endoscopy;  Laterality: N/A;  . FEMORAL-FEMORAL BYPASS GRAFT Bilateral 04/18/2015   Procedure:  RIGHT COMMON FEMORAL TO LEFT COMMON  FEMORAL  ARTERY BYPASS WITH  8 MM HEMASHIELD GRAFT,RIGHT COMMON SUPERFICIAL FEMORAL ARTERY ENDARTERECTOMY;  Surgeon: Elam Dutch, MD;  Location: [place];  Service: Vascular;  Laterality: Bilateral;  . ILEOSTOMY Right 09/18/2014   Procedure: ILEOCOLOSTOMY;  Surgeon: Doreen Salvage, MD;  Location: [place];  Service: General;  Laterality: Right;  . LAPAROTOMY N/A 11/01/2014   Procedure: EXPLORATORY LAPAROTOMY;  Surgeon: Doreen Salvage, MD;  Location: [place];  Service: General;  Laterality: N/A;  . LAPAROTOMY N/A 12/26/2014   Procedure:  LAPAROTOMY ;  Surgeon: Everitt Amber, MD;  Location: [facility];  Service: Gynecology;  Laterality: N/A;  . MASS EXCISION  11/01/2014   Procedure: EXCISION PELVIC MASS;  Surgeon: Doreen Salvage, MD;  Location: [place];  Service: General;;  . OOPHORECTOMY  2016  . PARTIAL COLECTOMY Right 09/18/2014   Procedure: RIGHT HEMI COLECTOMY;  Surgeon: Doreen Salvage, MD;  Location: [place];  Service: General;  Laterality: Right;  . ROBOTIC ASSISTED TOTAL HYSTERECTOMY WITH BILATERAL SALPINGO OOPHERECTOMY Right 12/26/2014   Procedure: EXPLORATORY LAPAROSCOPY  RIGHT  SALPINGO OOPHORECTOMY OMENTECTOMY LYMPHADECTOMY ;  Surgeon: Everitt Amber, MD;  Location: [facility];  Service: Gynecology;  Laterality: Right;  . TUBAL LIGATION      Allergies  Allergen Reactions  . Other Hives and Itching    Highly acidic foods "make me itch and break out" oranges, tomatoes  . Demerol [Meperidine] Nausea And Vomiting  . Morphine And Related Nausea And Vomiting  . Oxycodone Itching  . Penicillins Other (See Comments)    Childhood allergy  . Tylenol [Acetaminophen] Nausea And Vomiting  . Ibuprofen Nausea And Vomiting    Current Outpatient Medications  Medication Sig Dispense Refill  . acetaminophen (TYLENOL) 325 MG tablet Take 325 mg by mouth as needed.    . docusate sodium 100 MG CAPS Take 100 mg by mouth 2 (two) times daily as needed for mild constipation. 10 capsule 0  . lidocaine (LIDODERM) 5 % Place 1 patch onto the skin daily. Remove & Discard patch within 12 hours or as directed by MD 30 patch 1  . ondansetron (ZOFRAN) 4 MG tablet Take 1 tablet (4 mg total) by mouth every 8 (eight) hours as needed for nausea or vomiting. 20 tablet 0  . pregabalin (LYRICA) 200 MG capsule take 1 capsule by mouth every 8 hours 90 capsule 3   No current facility-administered medications for this visit.     ROS: See HPI for pertinent positives and negatives.   Physical Examination  Vitals:   05/24/18 1217 05/24/18 1218  BP: (!) 140/100 (!) 139/98  Pulse: 68   Resp: 18   SpO2: 100%   Weight: 180 lb (81.6 kg)   Height: 5\' 7"  (1.702 m)    Body mass index is 28.19 kg/m.  General: A&O x 3, WDWN. Gait: normal Eyes: PERRLA. Pulmonary: Respirations are labored, little air movement on all fields, no wheezes, no rales, no rhonchi.  Cardiac: regular rhythm and rate, no detected murmur.    Carotid Bruits Right Left   Negative Negative   Abdominal aortic pulse is not palpable. Radial pulses: 1+ palpable and =  Palpable fem-fem bypass graft pulse    VASCULAR EXAM: Extremitieswithoutischemic changes, withoutGangrene; withoutopen wounds.  LE Pulses Right Left  FEMORAL not palpable not palpable, tender to palpation  POPLITEAL not palpable not  palpable  POSTERIOR TIBIAL not palpable not palpable  DORSALIS PEDIS ANTERIOR TIBIAL 1+palpable 1+ palpable    Abdomen: soft, NT, no palpable masses. Skin: no rashes, no ulcers. Musculoskeletal: no muscle wasting or atrophy other than mild deformity of left foot. Neurologic: A&O X 3; Appropriate Affect ; SENSATION: dysesthesia left lower extremity; MOTOR FUNCTION: moving all extremities equally, motor strength 5/5 throughout. Speech is fluent/normal. CN 2-12 intact.  Skin: no rashes, no cellulitis, no ulcers noted. Musculoskeletal: no muscle wasting or atrophy other than mild deformity of left foot..  Neurologic: A&O X 3; appropriate affect, Sensation: dysesthesia left lower extremity, MOTOR FUNCTION:  moving all extremities equally, motor strength 5/5 throughout. Speech is fluent/normal. CN 2-12 intact. Psychiatric: Thought content is normal, mood appropriate for clinical situation.     ASSESSMENT: Catherine Rogers is a 57 y.o. female who is s/p right to left femoral-femoral bypass on 04/18/2015. This was done primarily for rest pain in the left foot. She continues to have pain in the left foot despite revascularization. She states that the foot is 70% better than preoperatively.   After walking about 150 feet, entire left leg is painful.   She has weaned herself off narcotics and takes Lyrica with some degree of pain relief of the left leg and abdomen.   I suggested to pt several times that she needs to be seen in [facility] ED now for severe shortness of breath, and needs to be taken there by ambulance, instead of her transportation company taking her home, then her family taking her, which is what pt states she want to  do. She states she wants her family to be with her in the ED.  However,  her SAO2 remained at 100% on room air.    DATA  ABI (Date: 05/24/2018):  R:   ABI: 0.71 (was 0.76 on 09-24-17),   PT: mono  DP: mono  TBI:  0.52 (was 0.46)  L:   ABI: 0.76 (was 0.75),   PT: mono  DP: mono  TBI: 0.41 (was 0.46)  Bilateral ABI and TBI remain stable with moderate disease bilaterally and all monophasic waveforms.     PLAN:  Pt insists on taking her [employer]i transportation home, then having her family take her to [facility] ED.   Based on the patient's vascular studies and examination, pt will return to clinic in 6 monthswith ABI's. I advised her to return sooner if she develops concerns re the circulation in her feet/legs.   Continue graduated walking program and daily leg exercises.    I discussed in depth with the patient the nature of atherosclerosis, and emphasized the importance of maximal medical management including strict control of blood pressure, blood glucose, and lipid levels, obtaining regular exercise, and cessation of smoking.  The patient is aware that without maximal medical management the underlying atherosclerotic disease process will progress, limiting the benefit of any interventions.  The patient was given information about PAD including signs, symptoms, treatment, what symptoms should prompt the patient to seek immediate medical care, and risk reduction measures to take.  Clemon Chambers, RN, MSN, FNP-C [facility] Phone: [phone]  Clinic MD: Trula Slade  05/24/18 2:26 PM

## 2018-05-25 ENCOUNTER — Ambulatory Visit
Admission: RE | Admit: 2018-05-25 | Discharge: 2018-05-25 | Disposition: A | Discharge status: Home / Self Care | Payer: Medicare Other | Source: Ambulatory Visit | Attending: Orthopedic Surgery | Admitting: Orthopedic Surgery

## 2018-05-25 DIAGNOSIS — M87852 Other osteonecrosis, left femur: Secondary | ICD-10-CM

## 2018-05-25 DIAGNOSIS — M25552 Pain in left hip: Secondary | ICD-10-CM | POA: Diagnosis not present

## 2018-05-31 ENCOUNTER — Other Ambulatory Visit: Payer: Self-pay

## 2018-05-31 DIAGNOSIS — I739 Peripheral vascular disease, unspecified: Secondary | ICD-10-CM

## 2018-05-31 DIAGNOSIS — Z95828 Presence of other vascular implants and grafts: Secondary | ICD-10-CM

## 2018-05-31 DIAGNOSIS — I779 Disorder of arteries and arterioles, unspecified: Secondary | ICD-10-CM

## 2018-06-16 ENCOUNTER — Telehealth: Payer: Self-pay

## 2018-06-16 ENCOUNTER — Other Ambulatory Visit: Payer: Self-pay | Admitting: Nurse Practitioner

## 2018-06-16 ENCOUNTER — Other Ambulatory Visit: Payer: Self-pay | Admitting: Hematology

## 2018-06-16 DIAGNOSIS — Z1501 Genetic susceptibility to malignant neoplasm of breast: Secondary | ICD-10-CM

## 2018-06-16 DIAGNOSIS — Z1509 Genetic susceptibility to other malignant neoplasm: Principal | ICD-10-CM

## 2018-06-16 MED ORDER — LIDOCAINE 5 % EX PTCH: 1.0000 | MEDICATED_PATCH | CUTANEOUS | 1 refills | Status: DC

## 2018-06-16 NOTE — Telephone Encounter (Signed)
Received fax from pharmacy requesting refill of lyrica. Last refill was 05/18/18 per database. Please advise.

## 2018-06-21 MED ORDER — PREGABALIN 200 MG PO CAPS: ORAL_CAPSULE | ORAL | 0 refills | Status: DC

## 2018-06-21 NOTE — Telephone Encounter (Signed)
refill sent 

## 2018-08-06 ENCOUNTER — Other Ambulatory Visit: Payer: Self-pay | Admitting: Nurse Practitioner

## 2018-08-06 NOTE — Telephone Encounter (Signed)
Copied from [place] [id number]. Topic: Quick Communication - Rx Refill/Question >> Aug 06, 2018  4:23 PM Oliver Pila B wrote: Medication: pregabalin (LYRICA) 200 MG capsule [[id number]]   Has the patient contacted their pharmacy? Yes.   (Agent: If no, request that the patient contact the pharmacy for the refill.) (Agent: If yes, when and what did the pharmacy advise?)  Preferred Pharmacy (with phone number or street name): [facility]  Agent: Please be advised that RX refills may take up to 3 business days. We ask that you follow-up with your pharmacy.

## 2018-08-06 NOTE — Telephone Encounter (Signed)
Lyrica refill Last Refill:06/21/18 #90 Last OV: 09/17/17 PCP: Pricilla Holm Pharmacy:[facility]

## 2018-08-09 ENCOUNTER — Other Ambulatory Visit: Payer: Self-pay | Admitting: Vascular Surgery

## 2018-08-10 MED ORDER — PREGABALIN 200 MG PO CAPS: ORAL_CAPSULE | ORAL | 0 refills | Status: DC

## 2018-08-24 ENCOUNTER — Other Ambulatory Visit: Payer: Self-pay | Admitting: Nurse Practitioner

## 2018-08-24 DIAGNOSIS — Z1501 Genetic susceptibility to malignant neoplasm of breast: Secondary | ICD-10-CM

## 2018-08-24 DIAGNOSIS — Z1509 Genetic susceptibility to other malignant neoplasm: Principal | ICD-10-CM

## 2018-10-05 ENCOUNTER — Other Ambulatory Visit: Payer: Self-pay | Admitting: Nurse Practitioner

## 2018-10-05 DIAGNOSIS — Z1501 Genetic susceptibility to malignant neoplasm of breast: Secondary | ICD-10-CM

## 2018-10-05 DIAGNOSIS — Z1509 Genetic susceptibility to other malignant neoplasm: Principal | ICD-10-CM

## 2018-10-14 ENCOUNTER — Other Ambulatory Visit: Payer: Self-pay | Admitting: Nurse Practitioner

## 2018-10-14 ENCOUNTER — Other Ambulatory Visit: Payer: Self-pay | Admitting: Emergency Medicine

## 2018-10-14 DIAGNOSIS — Z1501 Genetic susceptibility to malignant neoplasm of breast: Secondary | ICD-10-CM

## 2018-10-14 DIAGNOSIS — Z1509 Genetic susceptibility to other malignant neoplasm: Principal | ICD-10-CM

## 2018-10-14 MED ORDER — LIDOCAINE 5 % EX PTCH: MEDICATED_PATCH | CUTANEOUS | 1 refills | Status: DC

## 2018-10-18 ENCOUNTER — Other Ambulatory Visit: Payer: Self-pay | Admitting: Nurse Practitioner

## 2018-11-01 ENCOUNTER — Telehealth: Payer: Self-pay | Admitting: *Deleted

## 2018-11-01 NOTE — Telephone Encounter (Signed)
Patient called and rescheduled her appt from this week to next.

## 2018-11-02 ENCOUNTER — Other Ambulatory Visit: Payer: Self-pay | Admitting: Nurse Practitioner

## 2018-11-03 ENCOUNTER — Ambulatory Visit: Payer: Medicare Other | Admitting: Obstetrics

## 2018-11-03 NOTE — Telephone Encounter (Signed)
[place] Controlled Database Checked Last filled: 09/28/18 # 90 LOV w/you: 09/17/2017 Next appt w/you: None

## 2018-11-09 ENCOUNTER — Telehealth: Payer: Self-pay | Admitting: Genetic Counselor

## 2018-11-09 NOTE — Progress Notes (Signed)
S: chest pain and SOB. Headache. States chest pain started 3 am. Her main concern is why she is losing weight. States she has been having off and on fever/chills.  O:  Vitals:   11/12/18 1141  BP: (!) 180/100  Pulse: 62  Resp: 18  Temp: 98.3 F (36.8 C)  SpO2: 100%   Pulm - audibly wheezing although it started after I asked her about SOB. Abdo - soft, NTND.  Ext no edema, NT  A/P Reschedule for outpatient followup today Advised her she needs to go to the ER. Possible r/o PE/MI.

## 2018-11-09 NOTE — Telephone Encounter (Signed)
Returned patient call.  LM on VM that I had called.  Asked that she CB.

## 2018-11-12 ENCOUNTER — Emergency Department (HOSPITAL_COMMUNITY)
Admission: EM | Admit: 2018-11-12 | Discharge: 2018-11-12 | Disposition: A | Discharge status: Home / Self Care | Payer: Medicare Other | Source: Home / Self Care | Attending: Emergency Medicine | Admitting: Emergency Medicine

## 2018-11-12 ENCOUNTER — Other Ambulatory Visit: Payer: Self-pay

## 2018-11-12 ENCOUNTER — Inpatient Hospital Stay: Payer: Medicare Other | Attending: Obstetrics | Admitting: Obstetrics

## 2018-11-12 ENCOUNTER — Emergency Department (HOSPITAL_COMMUNITY): Payer: Medicare Other

## 2018-11-12 ENCOUNTER — Encounter: Payer: Self-pay | Admitting: Obstetrics

## 2018-11-12 ENCOUNTER — Inpatient Hospital Stay: Payer: Medicare Other | Admitting: Hematology

## 2018-11-12 ENCOUNTER — Encounter (HOSPITAL_COMMUNITY): Payer: Self-pay | Admitting: Emergency Medicine

## 2018-11-12 VITALS — BP 180/100 | HR 62 | Temp 98.3°F | Resp 18 | Ht 67.0 in | Wt 166.0 lb

## 2018-11-12 DIAGNOSIS — R978 Other abnormal tumor markers: Secondary | ICD-10-CM

## 2018-11-12 DIAGNOSIS — R0602 Shortness of breath: Secondary | ICD-10-CM | POA: Insufficient documentation

## 2018-11-12 DIAGNOSIS — Z15060 Genetic susceptibility to colorectal cancer: Secondary | ICD-10-CM

## 2018-11-12 DIAGNOSIS — F419 Anxiety disorder, unspecified: Secondary | ICD-10-CM | POA: Insufficient documentation

## 2018-11-12 DIAGNOSIS — I10 Essential (primary) hypertension: Secondary | ICD-10-CM

## 2018-11-12 DIAGNOSIS — R079 Chest pain, unspecified: Secondary | ICD-10-CM

## 2018-11-12 DIAGNOSIS — Z1501 Genetic susceptibility to malignant neoplasm of breast: Secondary | ICD-10-CM

## 2018-11-12 DIAGNOSIS — F17290 Nicotine dependence, other tobacco product, uncomplicated: Secondary | ICD-10-CM | POA: Insufficient documentation

## 2018-11-12 DIAGNOSIS — Z79899 Other long term (current) drug therapy: Secondary | ICD-10-CM | POA: Insufficient documentation

## 2018-11-12 DIAGNOSIS — Z1509 Genetic susceptibility to other malignant neoplasm: Secondary | ICD-10-CM

## 2018-11-12 DIAGNOSIS — R0789 Other chest pain: Secondary | ICD-10-CM

## 2018-11-12 DIAGNOSIS — R030 Elevated blood-pressure reading, without diagnosis of hypertension: Secondary | ICD-10-CM

## 2018-11-12 LAB — COMPREHENSIVE METABOLIC PANEL
ALT: 11 U/L (ref 0–44)
AST: 19 U/L (ref 15–41)
Albumin: 4.7 g/dL (ref 3.5–5.0)
Alkaline Phosphatase: 68 U/L (ref 38–126)
Anion gap: 9 (ref 5–15)
BUN: 11 mg/dL (ref 6–20)
CO2: 27 mmol/L (ref 22–32)
Calcium: 9.5 mg/dL (ref 8.9–10.3)
Chloride: 106 mmol/L (ref 98–111)
Creatinine, Ser: 0.88 mg/dL (ref 0.44–1.00)
GFR calc Af Amer: >60 mL/min (ref >60)
Glucose, Bld: 96 mg/dL (ref 70–99)
Potassium: 4.2 mmol/L (ref 3.5–5.1)
Sodium: 142 mmol/L (ref 135–145)
TOTAL PROTEIN: 8.3 g/dL — AB (ref 6.5–8.1)
Total Bilirubin: 0.6 mg/dL (ref 0.3–1.2)

## 2018-11-12 LAB — BRAIN NATRIURETIC PEPTIDE: B Natriuretic Peptide: 113.6 pg/mL — ABNORMAL HIGH (ref 0.0–100.0)

## 2018-11-12 LAB — CBC WITH DIFFERENTIAL/PLATELET
Abs Immature Granulocytes: 0.02 10*3/uL (ref 0.00–0.07)
Basophils Absolute: 0 10*3/uL (ref 0.0–0.1)
Basophils Relative: 1 %
Eosinophils Absolute: 0 10*3/uL (ref 0.0–0.5)
Eosinophils Relative: 0 %
HEMATOCRIT: 45.9 % (ref 36.0–46.0)
Hemoglobin: 16.3 g/dL — ABNORMAL HIGH (ref 12.0–15.0)
Immature Granulocytes: 0 %
Lymphocytes Relative: 49 %
Lymphs Abs: 3 10*3/uL (ref 0.7–4.0)
MCH: 36.1 pg — ABNORMAL HIGH (ref 26.0–34.0)
MCHC: 35.5 g/dL (ref 30.0–36.0)
MCV: 101.8 fL — AB (ref 80.0–100.0)
MONO ABS: 0.4 10*3/uL (ref 0.1–1.0)
MONOS PCT: 6 %
Neutro Abs: 2.6 10*3/uL (ref 1.7–7.7)
Neutrophils Relative %: 44 %
Platelets: 159 10*3/uL (ref 150–400)
RBC: 4.51 MIL/uL (ref 3.87–5.11)
RDW: 12.5 % (ref 11.5–15.5)
WBC: 6 10*3/uL (ref 4.0–10.5)
nRBC: 0 % (ref 0.0–0.2)

## 2018-11-12 LAB — LIPASE, BLOOD: LIPASE: 52 U/L — AB (ref 11–51)

## 2018-11-12 LAB — TROPONIN I: Troponin I: <0.03 ng/mL (ref <0.03)

## 2018-11-12 MED ORDER — LORAZEPAM 2 MG/ML IJ SOLN
0.5000 mg | Freq: Once | INTRAMUSCULAR | Status: AC
  Administered 2018-11-12: 0.5 mg via INTRAVENOUS
  Filled 2018-11-12: qty 1

## 2018-11-12 MED ORDER — IOPAMIDOL (ISOVUE-370) INJECTION 76%
INTRAVENOUS | Status: AC
  Filled 2018-11-12: qty 100

## 2018-11-12 MED ORDER — LORAZEPAM 0.5 MG PO TABS: 0.5000 mg | ORAL_TABLET | Freq: Three times a day (TID) | ORAL | 0 refills | Status: DC | PRN

## 2018-11-12 MED ORDER — SODIUM CHLORIDE (PF) 0.9 % IJ SOLN
INTRAMUSCULAR | Status: AC
  Filled 2018-11-12: qty 50

## 2018-11-12 MED ORDER — IOPAMIDOL (ISOVUE-370) INJECTION 76%
100.0000 mL | Freq: Once | INTRAVENOUS | Status: AC | PRN
  Administered 2018-11-12: 100 mL via INTRAVENOUS

## 2018-11-12 NOTE — ED Provider Notes (Signed)
[facility] DEPT Provider Note   CSN: [id number] Arrival date & time: 11/12/18  1234     History   Chief Complaint Chief Complaint  Patient presents with  . Chest Pain  . Shortness of Breath    HPI Catherine Rogers is a 57 y.o. female.  HPI Patient was seen by her oncologist and referred to the emergency department.  States she developed central chest pain and shortness of breath that started this morning around 330 while watching television.  Describes the pain as squeezing in nature.  Denies cough.  She has had some chills but no fever.  She has chronic left leg pain which she states has worsened over the last month due to being taken off her Lyrica and started on gabapentin.  She has chronic abdominal pain from previous surgery which is unchanged. Past Medical History:  Diagnosis Date  . Anemia   . Anxiety   . Arthritis   . Asthma   . Cancer ([place])    colon and ovarian  . Complication of anesthesia    woke and saw long tube coming out of mouth . sat up then went back to sleep  . Depression   . Ectopic pregnancy   . Family history of adverse reaction to anesthesia    sister on dialysis- hypotension   . Family history of ovarian cancer   . Head injury, closed, with brief LOC ([place])    had a cut- as a child  . Headache   . Hepatitis    being treated for Hep C  . History of blood transfusion    2005 approximately   . Hypertension    no meds now  . PAD (peripheral artery disease) ([place]) 04/18/2015  . Pelvic cyst   . PONV (postoperative nausea and vomiting)   . PTSD (post-traumatic stress disorder)     Patient Active Problem List   Diagnosis Date Noted  . Nausea 09/17/2017  . Neuropathy 09/17/2017  . Chronic hepatitis C without hepatic coma ([place]) 12/31/2016  . Routine general medical examination at a health care facility 09/04/2016  . Vitamin D deficiency 09/04/2016  . Medicare annual wellness visit, subsequent 09/04/2016  . Type II  CRPS (complex regional pain syndrome) 10/22/2015  . Numbness of left foot 07/05/2015  . BRCA1 genetic carrier 05/12/2015  . PAD (peripheral artery disease) ([place]) 04/18/2015  . Genetic testing 04/06/2015  . Family history of ovarian cancer   . Lumbosacral plexopathy 01/29/2015  . Left leg numbness 01/05/2015  . Postoperative anemia due to acute blood loss 12/28/2014  . Ovarian cancer ([place]) 12/26/2014  . Hypertension 12/22/2014  . Anxiety 12/22/2014  . Cancer associated pain 12/22/2014  . 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 ([place]) 11/28/2014  . Cancer of right colon ([place]) 10/11/2014  . Intussusception, ileocecal ([place]) 09/18/2014  . Mass of pelvis 09/18/2014  . GI bleed 09/16/2014  . Alcohol use 09/16/2014    Past Surgical History:  Procedure Laterality Date  . ABDOMINAL HYSTERECTOMY    . BREAST BIOPSY Right 04/02/2017   Procedure: INCISIONAL BIOPSY RIGHT NIPPLE;  Surgeon: Coralie Keens, MD;  Location: [facility];  Service: General;  Laterality: Right;  . COLONOSCOPY    . ESOPHAGOGASTRODUODENOSCOPY N/A 09/17/2014   Procedure: ESOPHAGOGASTRODUODENOSCOPY (EGD);  Surgeon: Jeryl Columbia, MD;  Location: [facility] ENDOSCOPY;  Service: Endoscopy;  Laterality: N/A;  . FEMORAL-FEMORAL BYPASS GRAFT Bilateral 04/18/2015   Procedure:  RIGHT COMMON FEMORAL TO LEFT COMMON  FEMORAL  ARTERY BYPASS WITH  8 MM HEMASHIELD GRAFT,RIGHT COMMON SUPERFICIAL FEMORAL ARTERY ENDARTERECTOMY;  Surgeon: Elam Dutch, MD;  Location: [facility];  Service: Vascular;  Laterality: Bilateral;  . ILEOSTOMY Right 09/18/2014   Procedure: ILEOCOLOSTOMY;  Surgeon: Doreen Salvage, MD;  Location: [facility];  Service: General;  Laterality: Right;  . LAPAROTOMY N/A 11/01/2014   Procedure: EXPLORATORY LAPAROTOMY;  Surgeon: Doreen Salvage, MD;  Location: [facility];  Service: General;  Laterality: N/A;  . LAPAROTOMY N/A 12/26/2014   Procedure:  LAPAROTOMY ;  Surgeon: Everitt Amber, MD;  Location: [facility];  Service: Gynecology;  Laterality: N/A;  . MASS  EXCISION  11/01/2014   Procedure: EXCISION PELVIC MASS;  Surgeon: Doreen Salvage, MD;  Location: [facility];  Service: General;;  . OOPHORECTOMY  2016  . PARTIAL COLECTOMY Right 09/18/2014   Procedure: RIGHT HEMI COLECTOMY;  Surgeon: Doreen Salvage, MD;  Location: [facility];  Service: General;  Laterality: Right;  . ROBOTIC ASSISTED TOTAL HYSTERECTOMY WITH BILATERAL SALPINGO OOPHERECTOMY Right 12/26/2014   Procedure: EXPLORATORY LAPAROSCOPY  RIGHT SALPINGO OOPHORECTOMY OMENTECTOMY LYMPHADECTOMY ;  Surgeon: Everitt Amber, MD;  Location: [facility];  Service: Gynecology;  Laterality: Right;  . TUBAL LIGATION       OB History   No obstetric history on file.      Home Medications    Prior to Admission medications   Medication Sig Start Date End Date Taking? Authorizing Provider  acetaminophen (TYLENOL) 325 MG tablet Take 325 mg by mouth as needed.   Yes [provider]  docusate sodium 100 MG CAPS Take 100 mg by mouth 2 (two) times daily as needed for mild constipation. 09/22/14  Yes Baird, Megan N, PA-C  lidocaine (LIDODERM) 5 % PLACE 1 PATCH ONTO THE SKIN EVERY DAY.REMOVE AND DISCARD PATCH WITHIN 12 HOURS OR AS DIRECTED BY PRESCRIBER 10/14/18  Yes Alla Feeling, NP  ondansetron (ZOFRAN) 4 MG tablet TAKE 1 TABLET(4 MG) BY MOUTH EVERY 8 HOURS AS NEEDED FOR NAUSEA OR VOMITING Patient taking differently: Take 4 mg by mouth every 8 (eight) hours as needed for nausea.  10/18/18  Yes Lance Sell, NP  pregabalin (LYRICA) 200 MG capsule take 1 capsule by mouth every 8 hours 08/10/18  Yes Shambley, Delphia Grates, NP  LORazepam (ATIVAN) 0.5 MG tablet Take 1 tablet (0.5 mg total) by mouth 3 (three) times daily as needed for anxiety. 11/12/18   Julianne Rice, MD    Family History Family History  Problem Relation Age of Onset  . Brain cancer Sister 22  . Lung cancer Brother 74  . Heart attack Mother   . Heart attack Father        36s  . Heart disease Father   . Heart attack Maternal Uncle   . Diabetes  Maternal Uncle   . Breast cancer Maternal Uncle   . Ovarian cancer Paternal Aunt        3 paternal aunts with ovarian cancer  . Heart attack Brother   . Diabetes Paternal Uncle     Social History Social History   Tobacco Use  . Smoking status: Current Some Day Smoker    Years: 10.00    Types: Cigars  . Smokeless tobacco: Never Used  . Tobacco comment: pack per month of cigars = 5  Substance Use Topics  . Alcohol use: No    Alcohol/week: 7.0 standard drinks    Types: 7 Standard drinks or equivalent per week  . Drug use: Yes    Frequency: 5.0 times per week  Types: Marijuana    Comment: last smoked 09/22/17     Allergies   Other; Demerol [meperidine]; Morphine and related; Oxycodone; Penicillins; Tylenol [acetaminophen]; and Ibuprofen   Review of Systems Review of Systems  Constitutional: Positive for chills. Negative for fever.  Respiratory: Positive for shortness of breath. Negative for cough.   Cardiovascular: Positive for chest pain. Negative for palpitations and leg swelling.  Gastrointestinal: Positive for abdominal pain. Negative for nausea and vomiting.  Genitourinary: Negative for dysuria, flank pain and frequency.  Musculoskeletal: Positive for myalgias. Negative for back pain and neck pain.  Skin: Negative for rash and wound.  Neurological: Negative for dizziness, weakness, light-headedness, numbness and headaches.  Psychiatric/Behavioral: The patient is nervous/anxious.   All other systems reviewed and are negative.    Physical Exam Updated Vital Signs BP (!) 190/111   Pulse 80   Resp 20   SpO2 100%   Physical Exam Vitals signs and nursing note reviewed.  Constitutional:      General: She is not in acute distress.    Appearance: Normal appearance. She is well-developed. She is not ill-appearing.     Comments: Very anxious appearing.  Tearful.  HENT:     Head: Normocephalic and atraumatic.     Nose: Nose normal.     Mouth/Throat:     Mouth:  Mucous membranes are moist.  Eyes:     Pupils: Pupils are equal, round, and reactive to light.  Neck:     Musculoskeletal: Normal range of motion and neck supple.  Cardiovascular:     Rate and Rhythm: Normal rate and regular rhythm.  Pulmonary:     Effort: Pulmonary effort is normal.     Breath sounds: Normal breath sounds.     Comments: No respiratory distress.  No wheezing.   Abdominal:     General: Bowel sounds are normal.     Palpations: Abdomen is soft.     Tenderness: There is abdominal tenderness. There is no guarding or rebound.     Comments: Diffuse abdominal tenderness though examination limited due to patient refusal.  Musculoskeletal: Normal range of motion.        General: No tenderness.     Comments: Diffuse left calf tenderness.  Distal pulses intact.  No lower extremity swelling or asymmetry.  Skin:    General: Skin is warm and dry.     Findings: No erythema or rash.  Neurological:     General: No focal deficit present.     Mental Status: She is alert and oriented to person, place, and time.     Comments: Moving all extremities without focal deficit.  Sensation intact.  Psychiatric:        Behavior: Behavior normal.     Comments: Very anxious appearing      ED Treatments / Results  Labs (all labs ordered are listed, but only abnormal results are displayed) Labs Reviewed  CBC WITH DIFFERENTIAL/PLATELET - Abnormal; Notable for the following components:      Result Value   Hemoglobin 16.3 (*)    MCV 101.8 (*)    MCH 36.1 (*)    All other components within normal limits  COMPREHENSIVE METABOLIC PANEL - Abnormal; Notable for the following components:   Total Protein 8.3 (*)    All other components within normal limits  LIPASE, BLOOD - Abnormal; Notable for the following components:   Lipase 52 (*)    All other components within normal limits  BRAIN NATRIURETIC PEPTIDE - Abnormal; Notable for  the following components:   B Natriuretic Peptide 113.6 (*)     All other components within normal limits  TROPONIN I    EKG EKG Interpretation  Date/Time:  Friday November 12 2018 12:51:39 EST Ventricular Rate:  76 PR Interval:    QRS Duration: 100 QT Interval:  405 QTC Calculation: 456 R Axis:   29 Text Interpretation:  Sinus rhythm Probable left atrial enlargement Borderline T wave abnormalities Minimal ST elevation, anterior leads Confirmed by Julianne Rice [phone]) on 11/12/2018 12:54:55 PM   Radiology Ct Angio Chest Pe W And/or Wo Contrast  Result Date: 11/12/2018 CLINICAL DATA:  Chest pain.  Shortness of breath. EXAM: CT ANGIOGRAPHY CHEST WITH CONTRAST TECHNIQUE: Multidetector CT imaging of the chest was performed using the standard protocol during bolus administration of intravenous contrast. Multiplanar CT image reconstructions and MIPs were obtained to evaluate the vascular anatomy. CONTRAST:  157m ISOVUE-370 IOPAMIDOL (ISOVUE-370) INJECTION 76% COMPARISON:  None. FINDINGS: Cardiovascular: The heart size is normal. The aorta and great vessel origins are within normal limits. Pulmonary artery opacification is excellent. There are no focal filling defects to suggest pulmonary embolus. Mediastinum/Nodes: No significant mediastinal, hilar, or axillary adenopathy is present. Lungs/Pleura: Lungs are clear without focal nodule, mass, or airspace disease. There is no significant pleural effusion. Upper Abdomen: Limited imaging of the upper abdomen is unremarkable. Musculoskeletal: Mild part curvature is present in the thoracolumbar spine. Vertebral body heights and alignment are maintained. Sternum is normal. Ribs are unremarkable. No focal lytic or blastic lesions are present. Review of the MIP images confirms the above findings. IMPRESSION: 1. No pulmonary embolus. 2. Acute or focal abnormality to explain chest pain. 3. Mild curvature of the thoracolumbar spine without focal abnormality. Electronically Signed   By: CSan MorelleM.D.   On:  11/12/2018 15:10   Dg Chest Port 1 View  Result Date: 11/12/2018 CLINICAL DATA:  Shortness of breath EXAM: PORTABLE CHEST 1 VIEW COMPARISON:  Chest radiograph August 18, 2016 and chest CT March 06, 2017 FINDINGS: There is no edema or consolidation. Heart size and pulmonary vascularity are normal. No adenopathy. No bone lesions. IMPRESSION: No edema or consolidation. Electronically Signed   By: WLowella GripIII M.D.   On: 11/12/2018 13:05    Procedures Procedures (including critical care time)  Medications Ordered in ED Medications  sodium chloride (PF) 0.9 % injection (has no administration in time range)  iopamidol (ISOVUE-370) 76 % injection (has no administration in time range)  LORazepam (ATIVAN) injection 0.5 mg (0.5 mg Intravenous Given 11/12/18 1305)  iopamidol (ISOVUE-370) 76 % injection 100 mL (100 mLs Intravenous Contrast Given 11/12/18 1450)     Initial Impression / Assessment and Plan / ED Course  I have reviewed the triage vital signs and the nursing notes.  Pertinent labs & imaging results that were available during my care of the patient were reviewed by me and considered in my medical decision making (see chart for details).     Patient states her chest pain is significantly improved.  She is resting comfortably.  Vital signs remained stable.  X-ray without acute findings.  EKG/troponin are without evidence of ischemia.  Given history of cancer will get CT angio of the chest to rule out PE. Given constant symptoms for the past 12 hours single troponin is sufficient to rule out MI.  CT chest without evidence of PE or other explanations for the patient's symptoms.  Patient states that she suffers from frequent anxiety attacks and typically uses meditation  and other relaxation methods to calm herself.  She has been on blood pressure medication in the past but states she took herself off of this several months ago.  She is advised to follow-up closely with her primary  physician who may wish to start her back on her medications.  Strict return precautions have been given. Final Clinical Impressions(s) / ED Diagnoses   Final diagnoses:  Atypical chest pain  Anxiety  Elevated blood pressure reading    ED Discharge Orders         Ordered    LORazepam (ATIVAN) 0.5 MG tablet  3 times daily PRN     11/12/18 1528           Julianne Rice, MD 11/12/18 1529

## 2018-11-12 NOTE — ED Triage Notes (Signed)
Patient reports having shortness of breath and chest pain that started this morning.

## 2018-11-12 NOTE — ED Notes (Signed)
Bed: KK93 Expected date:  Expected time:  Means of arrival:  Comments: Hold for cancer center

## 2018-11-12 NOTE — Discharge Instructions (Addendum)
Anxiety is likely contributing to your symptoms today.  Use Ativan as needed if your standard stress reduction techniques are not working.  Your blood pressure is also elevated.  Follow-up with your primary physician to have that rechecked and you may need to be started back on blood pressure medication.  Return for any worsening of your symptoms or concerns.

## 2018-12-06 ENCOUNTER — Ambulatory Visit (INDEPENDENT_AMBULATORY_CARE_PROVIDER_SITE_OTHER): Payer: Medicare Other | Admitting: Family

## 2018-12-06 ENCOUNTER — Encounter: Payer: Self-pay | Admitting: Family

## 2018-12-06 ENCOUNTER — Ambulatory Visit (HOSPITAL_COMMUNITY)
Admission: RE | Admit: 2018-12-06 | Discharge: 2018-12-06 | Disposition: A | Discharge status: Home / Self Care | Payer: Medicare Other | Source: Ambulatory Visit | Attending: Nurse Practitioner | Admitting: Nurse Practitioner

## 2018-12-06 ENCOUNTER — Other Ambulatory Visit: Payer: Self-pay

## 2018-12-06 VITALS — BP 157/89 | HR 69 | Temp 97.5°F | Resp 20 | Ht 67.0 in | Wt 180.0 lb

## 2018-12-06 DIAGNOSIS — I779 Disorder of arteries and arterioles, unspecified: Secondary | ICD-10-CM | POA: Diagnosis not present

## 2018-12-06 DIAGNOSIS — Z95828 Presence of other vascular implants and grafts: Secondary | ICD-10-CM | POA: Insufficient documentation

## 2018-12-06 DIAGNOSIS — G90522 Complex regional pain syndrome I of left lower limb: Secondary | ICD-10-CM | POA: Diagnosis not present

## 2018-12-06 DIAGNOSIS — I739 Peripheral vascular disease, unspecified: Secondary | ICD-10-CM | POA: Insufficient documentation

## 2018-12-06 DIAGNOSIS — F17200 Nicotine dependence, unspecified, uncomplicated: Secondary | ICD-10-CM | POA: Diagnosis not present

## 2018-12-06 NOTE — Progress Notes (Signed)
[facility] [place]   CC: Follow up peripheral artery occlusive disease  History of Present Illness Aleshka Corney is a 58 y.o. female who is s/p right to left femoral-femoral bypass on 04/18/2015 by Dr. Oneida Alar. This was done primarily for rest pain in the left foot. She continues to have pain in the left foot despite revascularization. She also still has some edema in the left foot.   Left foot x-ray showed no evidence of fracture but demineralization possibly reflex sympathetic dystrophy (RSD).  She has weaned off narcotic pain medication for her left foot. She states that her foot hurts day and night and feels cool, Lyrica is helping this. She has also noticed changes in the shape of her foot due to changes in her gait secondary to pain. This has remained about the same.   Dr. Oneida Alar last evaluated pt on 09-24-17. At that time  abdomen: Soft mildly tender diffusely at baseline 2+ graft pulse in her femoral-femoral bypass. Extremities: Symmetrically cool feet no palpable pedal pulses. Bilateral ABIs were 0.75 on the left 0.76 on the right unchanged from 2016. Complex regional pain syndrome left foot exacerbated by recent medication change. Dr. Oneida Alar did not believe that her pain symptoms were related to peripheral arterial disease as her ABIs had not changed over the previous 2 years and her symptoms had been stable. Pt was to follow-up with [place] and see our nurse practitioner in 6 months with bilateral ABIs. Dr. Oneida Alar reordered Lyrica 200 mg 3 times a day and discontinued nortriptyline; he forwarded this information to her primary care provider.  Pt states she did have an appointment with a pain management clinic, states she did not want to take any prescription pain medications and is not attending any longer.  She takes Lyrica, occasional Extra Strength Tylenol.  She states she has nerve damagesincethe ovarian cancer surgery, has left leg and abdominal pain  since then.  Pt has multiple complaints, difficult to follow her descriptions of her multiple complaints.  She reports occasional headache, occasional dyspnea, denies chest pain.  Her left foot and leg pain has not changed.  She does not seem to have claudication symptoms.   After walking about 150 feet, entire left leg is painful.   Diabetic: No Tobacco use: smoker  (admits to one cigar/day, and 1-2 joints of marijuana/day, which she states helps with the abdominal and left leg pain, and helps her sleep  Pt meds include: Statin :No Betablocker: No ASA: No Other anticoagulants/antiplatelets: no  Past Medical History:  Diagnosis Date  . Anemia   . Anxiety   . Arthritis   . Asthma   . Cancer ([place])    colon and ovarian  . Complication of anesthesia    woke and saw long tube coming out of mouth . sat up then went back to sleep  . Depression   . Ectopic pregnancy   . Family history of adverse reaction to anesthesia    sister on dialysis- hypotension   . Family history of ovarian cancer   . Head injury, closed, with brief LOC ([place])    had a cut- as a child  . Headache   . Hepatitis    being treated for Hep C  . History of blood transfusion    2005 approximately   . Hypertension    no meds now  . PAD (peripheral artery disease) ([place]) 04/18/2015  . Pelvic cyst   . PONV (postoperative nausea and vomiting)   .  PTSD (post-traumatic stress disorder)     Social History Social History   Tobacco Use  . Smoking status: Current Some Day Smoker    Years: 10.00    Types: Cigars  . Smokeless tobacco: Never Used  . Tobacco comment: pack per month of cigars = 5  Substance Use Topics  . Alcohol use: No    Alcohol/week: 7.0 standard drinks    Types: 7 Standard drinks or equivalent per week  . Drug use: Yes    Frequency: 5.0 times per week    Types: Marijuana    Comment: last smoked 09/22/17    Family History Family History  Problem Relation Age of Onset  . Brain  cancer Sister 40  . Lung cancer Brother 17  . Heart attack Mother   . Heart attack Father        58s  . Heart disease Father   . Heart attack Maternal Uncle   . Diabetes Maternal Uncle   . Breast cancer Maternal Uncle   . Ovarian cancer Paternal Aunt        3 paternal aunts with ovarian cancer  . Heart attack Brother   . Diabetes Paternal Uncle     Past Surgical History:  Procedure Laterality Date  . ABDOMINAL HYSTERECTOMY    . BREAST BIOPSY Right 04/02/2017   Procedure: INCISIONAL BIOPSY RIGHT NIPPLE;  Surgeon: Coralie Keens, MD;  Location: [facility];  Service: General;  Laterality: Right;  . COLONOSCOPY    . ESOPHAGOGASTRODUODENOSCOPY N/A 09/17/2014   Procedure: ESOPHAGOGASTRODUODENOSCOPY (EGD);  Surgeon: Jeryl Columbia, MD;  Location: [facility] ENDOSCOPY;  Service: Endoscopy;  Laterality: N/A;  . FEMORAL-FEMORAL BYPASS GRAFT Bilateral 04/18/2015   Procedure:  RIGHT COMMON FEMORAL TO LEFT COMMON  FEMORAL  ARTERY BYPASS WITH  8 MM HEMASHIELD GRAFT,RIGHT COMMON SUPERFICIAL FEMORAL ARTERY ENDARTERECTOMY;  Surgeon: Elam Dutch, MD;  Location: [place];  Service: Vascular;  Laterality: Bilateral;  . ILEOSTOMY Right 09/18/2014   Procedure: ILEOCOLOSTOMY;  Surgeon: Doreen Salvage, MD;  Location: [place];  Service: General;  Laterality: Right;  . LAPAROTOMY N/A 11/01/2014   Procedure: EXPLORATORY LAPAROTOMY;  Surgeon: Doreen Salvage, MD;  Location: [place];  Service: General;  Laterality: N/A;  . LAPAROTOMY N/A 12/26/2014   Procedure:  LAPAROTOMY ;  Surgeon: Everitt Amber, MD;  Location: [facility];  Service: Gynecology;  Laterality: N/A;  . MASS EXCISION  11/01/2014   Procedure: EXCISION PELVIC MASS;  Surgeon: Doreen Salvage, MD;  Location: [place];  Service: General;;  . OOPHORECTOMY  2016  . PARTIAL COLECTOMY Right 09/18/2014   Procedure: RIGHT HEMI COLECTOMY;  Surgeon: Doreen Salvage, MD;  Location: [place];  Service: General;  Laterality: Right;  . ROBOTIC ASSISTED TOTAL HYSTERECTOMY WITH BILATERAL SALPINGO  OOPHERECTOMY Right 12/26/2014   Procedure: EXPLORATORY LAPAROSCOPY  RIGHT SALPINGO OOPHORECTOMY OMENTECTOMY LYMPHADECTOMY ;  Surgeon: Everitt Amber, MD;  Location: [facility];  Service: Gynecology;  Laterality: Right;  . TUBAL LIGATION      Allergies  Allergen Reactions  . Other Hives and Itching    Highly acidic foods "make me itch and break out" oranges, tomatoes  . Demerol [Meperidine] Nausea And Vomiting  . Morphine And Related Nausea And Vomiting  . Oxycodone Itching  . Penicillins Other (See Comments)    Childhood allergy  . Tylenol [Acetaminophen] Nausea And Vomiting  . Ibuprofen Nausea And Vomiting    Current Outpatient Medications  Medication Sig Dispense Refill  . acetaminophen (TYLENOL) 325 MG tablet Take 325 mg by  mouth as needed.    . docusate sodium 100 MG CAPS Take 100 mg by mouth 2 (two) times daily as needed for mild constipation. 10 capsule 0  . gabapentin (NEURONTIN) 100 MG capsule Take 100 mg by mouth 3 (three) times daily.    [name] lidocaine (LIDODERM) 5 % PLACE 1 PATCH ONTO THE SKIN EVERY DAY.REMOVE AND DISCARD PATCH WITHIN 12 HOURS OR AS DIRECTED BY PRESCRIBER 30 patch 1  . ondansetron (ZOFRAN) 4 MG tablet TAKE 1 TABLET(4 MG) BY MOUTH EVERY 8 HOURS AS NEEDED FOR NAUSEA OR VOMITING (Patient taking differently: Take 4 mg by mouth every 8 (eight) hours as needed for nausea. ) 20 tablet 0  . LORazepam (ATIVAN) 0.5 MG tablet Take 1 tablet (0.5 mg total) by mouth 3 (three) times daily as needed for anxiety. (Patient not taking: Reported on 12/06/2018) 10 tablet 0  . pregabalin (LYRICA) 200 MG capsule take 1 capsule by mouth every 8 hours (Patient not taking: Reported on 12/06/2018) 90 capsule 0   No current facility-administered medications for this visit.     ROS: See HPI for pertinent positives and negatives.   Physical Examination  Vitals:   12/06/18 1148  BP: (!) 157/89  Pulse: 69  Resp: 20  Temp: (!) 97.5 F (36.4 C)  SpO2: 97%  Weight: 180 lb (81.6 kg)  Height: 5'  7" (1.702 m)   Body mass index is 28.19 kg/m.  General: A&O x 3, WDWN, female. Gait: normal HENT: No gross abnormalities.  Eyes: PERRLA. Pulmonary: Respirations are non labored, CTAB, fair air movement in all fields Cardiac: regular rhythm, no detected murmur.         Carotid Bruits Right Left   Negative Negative   Radial pulses are 2+ palpable bilaterally   Adominal aortic pulse is not palpable     Fem-fem bypass graft pulse is palpable                     VASCULAR EXAM: Extremities without ischemic changes, without Gangrene; without open wounds.                                                                                                          LE Pulses Right Left       FEMORAL  2+ palpable  2+ palpable        POPLITEAL  not palpable   not palpable       POSTERIOR TIBIAL  not palpable   not palpable        DORSALIS PEDIS      ANTERIOR TIBIAL 2+ palpable  not palpable    Abdomen: soft, NT, no palpable masses. Skin: no rashes, no cellulitis, no ulcers noted. Musculoskeletal: no muscle wasting or atrophy.  Neurologic: A&O X 3; appropriate affect, Sensation is normal; MOTOR FUNCTION:  moving all extremities equally, motor strength 5/5 throughout. Speech is fluent/normal. CN 2-12 intact. Psychiatric: Thought content is scattered, mood is anxious   ASSESSMENT: Raquelle Pietro is a 58 y.o. female who is s/p right to left femoral-femoral  bypass on 04/18/2015. This was done primarily for rest pain in the left foot. She continues to have pain in the left foot despite revascularization.   Complex pain syndrome: pt declined attending pain management clinic.  There are no signs of ischemia in her feet or legs, the symptoms in her legs remain stable. Arterial perfusion in her legs remains stable, moderate disease bilaterally.   Tobacco use: Over 3 minutes was spent counseling patient re smoking cessation, and patient was given several free resources re smoking  cessation.  After walking about 150 feet, entire left leg is painful.   She has weaned herself off narcotics and takes Lyrica with some degree of pain relief of the left leg and abdomen, also self medicates with marijuana.   DATA  ABI (Date: 12/06/2018):  R:   ABI: 0.68 (was 0.71 on 05-24-18),   PT: mono  DP: mono  TBI:  0.51, toe pressure 89 (was 0.52)  L:   ABI: 0.69 (was 0.76),   PT: mono  DP: mono  TBI: 0.45, toe pressure 79 (was 0.41) Stable bilateral ABI and TBI with moderate disease bilaterally, all monophasic waveforms.     PLAN:  Based on the patient's vascular studies and examination, pt will return to clinic in 1 year with ABI's. I advised pt to notify us if she develops concerns re the circulation in her feet or legs.  Walk a total of at least 30 minutes daily in a safe environment.   I discussed in depth with the patient the nature of atherosclerosis, and emphasized the importance of maximal medical management including strict control of blood pressure, blood glucose, and lipid levels, obtaining regular exercise, and cessation of smoking.  The patient is aware that without maximal medical management the underlying atherosclerotic disease process will progress, limiting the benefit of any interventions.  The patient was given information about PAD including signs, symptoms, treatment, what symptoms should prompt the patient to seek immediate medical care, and risk reduction measures to take.  Clemon Chambers, RN, MSN, FNP-C [facility] Phone: [phone]  Clinic MD: Trula Slade  12/06/18 12:27 PM

## 2018-12-06 NOTE — Patient Instructions (Signed)
Steps to Quit Smoking    Smoking tobacco can be bad for your health. It can also affect almost every organ in your body. Smoking puts you and people around you at risk for many serious long-lasting (chronic) diseases. Quitting smoking is hard, but it is one of the best things that you can do for your health. It is never too late to quit.  What are the benefits of quitting smoking?  When you quit smoking, you lower your risk for getting serious diseases and conditions. They can include:  · Lung cancer or lung disease.  · Heart disease.  · Stroke.  · Heart attack.  · Not being able to have children (infertility).  · Weak bones (osteoporosis) and broken bones (fractures).  If you have coughing, wheezing, and shortness of breath, those symptoms may get better when you quit. You may also get sick less often. If you are pregnant, quitting smoking can help to lower your chances of having a baby of low birth weight.  What can I do to help me quit smoking?  Talk with your doctor about what can help you quit smoking. Some things you can do (strategies) include:  · Quitting smoking totally, instead of slowly cutting back how much you smoke over a period of time.  · Going to in-person counseling. You are more likely to quit if you go to many counseling sessions.  · Using resources and support systems, such as:  ? Online chats with a counselor.  ? Phone quitlines.  ? Printed self-help materials.  ? Support groups or group counseling.  ? Text messaging programs.  ? Mobile phone apps or applications.  · Taking medicines. Some of these medicines may have nicotine in them. If you are pregnant or breastfeeding, do not take any medicines to quit smoking unless your doctor says it is okay. Talk with your doctor about counseling or other things that can help you.  Talk with your doctor about using more than one strategy at the same time, such as taking medicines while you are also going to in-person counseling. This can help make  quitting easier.  What things can I do to make it easier to quit?  Quitting smoking might feel very hard at first, but there is a lot that you can do to make it easier. Take these steps:  · Talk to your family and friends. Ask them to support and encourage you.  · Call phone quitlines, reach out to support groups, or work with a counselor.  · Ask people who smoke to not smoke around you.  · Avoid places that make you want (trigger) to smoke, such as:  ? Bars.  ? Parties.  ? Smoke-break areas at work.  · Spend time with people who do not smoke.  · Lower the stress in your life. Stress can make you want to smoke. Try these things to help your stress:  ? Getting regular exercise.  ? Deep-breathing exercises.  ? Yoga.  ? Meditating.  ? Doing a body scan. To do this, close your eyes, focus on one area of your body at a time from head to toe, and notice which parts of your body are tense. Try to relax the muscles in those areas.  · Download or buy apps on your mobile phone or tablet that can help you stick to your quit plan. There are many free apps, such as QuitGuide from the CDC (Centers for Disease Control and Prevention). You can find more   Document Reviewed: 03/27/2015 Elsevier Interactive Patient Education  2019 [place].     Peripheral Vascular Disease  Peripheral vascular disease (PVD) is a disease of the blood vessels that are not part of your heart and brain. A simple term for PVD is poor circulation. In most cases, PVD narrows the blood vessels that carry blood from your heart to the rest of your body. This can reduce the supply of blood to your arms, legs, and internal organs, like your stomach or kidneys. However, PVD most  often affects a person's lower legs and feet. Without treatment, PVD tends to get worse. PVD can also lead to acute ischemic limb. This is when an arm or leg suddenly cannot get enough blood. This is a medical emergency. Follow these instructions at home: Lifestyle  Do not use any products that contain nicotine or tobacco, such as cigarettes and e-cigarettes. If you need help quitting, ask your doctor.  Lose weight if you are overweight. Or, stay at a healthy weight as told by your doctor.  Eat a diet that is low in fat and cholesterol. If you need help, ask your doctor.  Exercise regularly. Ask your doctor for activities that are right for you. General instructions  Take over-the-counter and prescription medicines only as told by your doctor.  Take good care of your feet: ? Wear comfortable shoes that fit well. ? Check your feet often for any cuts or sores.  Keep all follow-up visits as told by your doctor This is important. Contact a doctor if:  You have cramps in your legs when you walk.  You have leg pain when you are at rest.  You have coldness in a leg or foot.  Your skin changes.  You are unable to get or have an erection (erectile dysfunction).  You have cuts or sores on your feet that do not heal. Get help right away if:  Your arm or leg turns cold, numb, and blue.  Your arms or legs become red, warm, swollen, painful, or numb.  You have chest pain.  You have trouble breathing.  You suddenly have weakness in your face, arm, or leg.  You become very confused or you cannot speak.  You suddenly have a very bad headache.  You suddenly cannot see. Summary  Peripheral vascular disease (PVD) is a disease of the blood vessels.  A simple term for PVD is poor circulation. Without treatment, PVD tends to get worse.  Treatment may include exercise, low fat and low cholesterol diet, and quitting smoking. This information is not intended to replace advice given to  you by your health care provider. Make sure you discuss any questions you have with your health care provider. Document Released: 02/04/2010 Document Revised: 12/18/2016 Document Reviewed: 12/18/2016 Elsevier Interactive Patient Education  2019 [employer].

## 2018-12-14 ENCOUNTER — Other Ambulatory Visit: Payer: Self-pay | Admitting: Nurse Practitioner

## 2018-12-14 NOTE — Telephone Encounter (Signed)
[place] Controlled Database Checked Last filled: 11/05/18 # 90 LOV w/you: 09/17/2017 Next appt w/you: None

## 2018-12-15 NOTE — Telephone Encounter (Signed)
No further fills without OV, scheduling staff will contact patient

## 2018-12-22 ENCOUNTER — Ambulatory Visit: Payer: Medicare Other | Admitting: Nurse Practitioner

## 2018-12-29 ENCOUNTER — Ambulatory Visit: Payer: Medicare Other | Admitting: Nurse Practitioner

## 2018-12-30 ENCOUNTER — Telehealth: Payer: Self-pay | Admitting: *Deleted

## 2018-12-30 NOTE — Telephone Encounter (Signed)
Copied from [place] [id number]. Topic: General - Other >> Dec 30, 2018 11:32 AM Leward Quan A wrote: Reason for CRM: Patient called to say that transportation contacted her about forms that was faxed to them about her appointment for 12/31/2018 at 2.30 pm. Per patient transportation would like the time and date of appointment written in the cover sheet also because that is all that is showing up other forms are blank. Please refax with instructions clearly written on cover sheet also. Thank you

## 2018-12-30 NOTE — Telephone Encounter (Signed)
Information for patient's 12/31/18 appt with PCP faxed to [phone] Attn: Dee.

## 2018-12-31 ENCOUNTER — Encounter: Payer: Self-pay | Admitting: Nurse Practitioner

## 2018-12-31 ENCOUNTER — Ambulatory Visit (INDEPENDENT_AMBULATORY_CARE_PROVIDER_SITE_OTHER): Payer: Medicare Other | Admitting: Nurse Practitioner

## 2018-12-31 VITALS — BP 160/80 | HR 65 | Temp 97.9°F | Ht 67.0 in | Wt 166.2 lb

## 2018-12-31 DIAGNOSIS — G629 Polyneuropathy, unspecified: Secondary | ICD-10-CM

## 2018-12-31 DIAGNOSIS — I10 Essential (primary) hypertension: Secondary | ICD-10-CM | POA: Diagnosis not present

## 2018-12-31 DIAGNOSIS — J069 Acute upper respiratory infection, unspecified: Secondary | ICD-10-CM | POA: Diagnosis not present

## 2018-12-31 DIAGNOSIS — I739 Peripheral vascular disease, unspecified: Secondary | ICD-10-CM | POA: Diagnosis not present

## 2018-12-31 DIAGNOSIS — J029 Acute pharyngitis, unspecified: Secondary | ICD-10-CM | POA: Diagnosis not present

## 2018-12-31 LAB — POCT RAPID STREP A (OFFICE): Rapid Strep A Screen: NEGATIVE

## 2018-12-31 MED ORDER — PREGABALIN 200 MG PO CAPS: ORAL_CAPSULE | ORAL | 0 refills | Status: DC

## 2018-12-31 MED ORDER — LOSARTAN POTASSIUM 50 MG PO TABS: 50.0000 mg | ORAL_TABLET | Freq: Every day | ORAL | 1 refills | Status: DC

## 2018-12-31 NOTE — Progress Notes (Signed)
Catherine Rogers is a 58 y.o. female with the following history as recorded in EpicCare:  Patient Active Problem List   Diagnosis Date Noted  . Nausea 09/17/2017  . Neuropathy 09/17/2017  . Chronic hepatitis C without hepatic coma ([place]) 12/31/2016  . Routine general medical examination at a health care facility 09/04/2016  . Vitamin D deficiency 09/04/2016  . Medicare annual wellness visit, subsequent 09/04/2016  . Type II CRPS (complex regional pain syndrome) 10/22/2015  . Numbness of left foot 07/05/2015  . BRCA1 genetic carrier 05/12/2015  . PAD (peripheral artery disease) ([place]) 04/18/2015  . Genetic testing 04/06/2015  . Family history of ovarian cancer   . Lumbosacral plexopathy 01/29/2015  . Left leg numbness 01/05/2015  . Postoperative anemia due to acute blood loss 12/28/2014  . Ovarian cancer ([place]) 12/26/2014  . Hypertension 12/22/2014  . Anxiety 12/22/2014  . Cancer associated pain 12/22/2014  . 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 ([place]) 11/28/2014  . Cancer of right colon ([place]) 10/11/2014  . Intussusception, ileocecal ([place]) 09/18/2014  . Mass of pelvis 09/18/2014  . Alcohol use 09/16/2014    Current Outpatient Medications  Medication Sig Dispense Refill  . acetaminophen (TYLENOL) 325 MG tablet Take 325 mg by mouth as needed.    . docusate sodium 100 MG CAPS Take 100 mg by mouth 2 (two) times daily as needed for mild constipation. 10 capsule 0  . lidocaine (LIDODERM) 5 % PLACE 1 PATCH ONTO THE SKIN EVERY DAY.REMOVE AND DISCARD PATCH WITHIN 12 HOURS OR AS DIRECTED BY PRESCRIBER 30 patch 1  . LORazepam (ATIVAN) 0.5 MG tablet Take 1 tablet (0.5 mg total) by mouth 3 (three) times daily as needed for anxiety. 10 tablet 0  . ondansetron (ZOFRAN) 4 MG tablet TAKE 1 TABLET(4 MG) BY MOUTH EVERY 8 HOURS AS NEEDED FOR NAUSEA OR VOMITING (Patient taking differently: Take 4 mg by mouth every 8 (eight) hours as needed for nausea. ) 20 tablet 0  . pregabalin (LYRICA) 200 MG capsule TAKE 1 CAPSULE BY  MOUTH EVERY 8 HOURS 90 capsule 0  . losartan (COZAAR) 50 MG tablet Take 1 tablet (50 mg total) by mouth daily. 30 tablet 1   No current facility-administered medications for this visit.     Allergies: Other; Demerol [meperidine]; Morphine and related; Oxycodone; Penicillins; Tylenol [acetaminophen]; and Ibuprofen  Past Medical History:  Diagnosis Date  . Anemia   . Anxiety   . Arthritis   . Asthma   . Cancer ([place])    colon and ovarian  . Complication of anesthesia    woke and saw long tube coming out of mouth . sat up then went back to sleep  . Depression   . Ectopic pregnancy   . Family history of adverse reaction to anesthesia    sister on dialysis- hypotension   . Family history of ovarian cancer   . Head injury, closed, with brief LOC ([place])    had a cut- as a child  . Headache   . Hepatitis    being treated for Hep C  . History of blood transfusion    2005 approximately   . Hypertension    no meds now  . PAD (peripheral artery disease) ([place]) 04/18/2015  . Pelvic cyst   . PONV (postoperative nausea and vomiting)   . PTSD (post-traumatic stress disorder)     Past Surgical History:  Procedure Laterality Date  . ABDOMINAL HYSTERECTOMY    . BREAST BIOPSY Right 04/02/2017   Procedure: INCISIONAL BIOPSY RIGHT  NIPPLE;  Surgeon: Coralie Keens, MD;  Location: [facility];  Service: General;  Laterality: Right;  . COLONOSCOPY    . ESOPHAGOGASTRODUODENOSCOPY N/A 09/17/2014   Procedure: ESOPHAGOGASTRODUODENOSCOPY (EGD);  Surgeon: Jeryl Columbia, MD;  Location[facility] ENDOSCOPY;  Service: Endoscopy;  Laterality: N/A;  . FEMORAL-FEMORAL BYPASS GRAFT Bilateral 04/18/2015   Procedure:  RIGHT COMMON FEMORAL TO LEFT COMMON  FEMORAL  ARTERY BYPASS WITH  8 MM HEMASHIELD GRAFT,RIGHT COMMON SUPERFICIAL FEMORAL ARTERY ENDARTERECTOMY;  Surgeon: Elam Dutch, MD;  Location: [facility];  Service: Vascular;  Laterality: Bilateral;  . ILEOSTOMY Right 09/18/2014   Procedure:  ILEOCOLOSTOMY;  Surgeon: Doreen Salvage, MD;  Location: [facility];  Service: General;  Laterality: Right;  . LAPAROTOMY N/A 11/01/2014   Procedure: EXPLORATORY LAPAROTOMY;  Surgeon: Doreen Salvage, MD;  Location: [facility];  Service: General;  Laterality: N/A;  . LAPAROTOMY N/A 12/26/2014   Procedure:  LAPAROTOMY ;  Surgeon: Everitt Amber, MD;  Location: [facility];  Service: Gynecology;  Laterality: N/A;  . MASS EXCISION  11/01/2014   Procedure: EXCISION PELVIC MASS;  Surgeon: Doreen Salvage, MD;  Location: [facility];  Service: General;;  . OOPHORECTOMY  2016  . PARTIAL COLECTOMY Right 09/18/2014   Procedure: RIGHT HEMI COLECTOMY;  Surgeon: Doreen Salvage, MD;  Location: [facility];  Service: General;  Laterality: Right;  . ROBOTIC ASSISTED TOTAL HYSTERECTOMY WITH BILATERAL SALPINGO OOPHERECTOMY Right 12/26/2014   Procedure: EXPLORATORY LAPAROSCOPY  RIGHT SALPINGO OOPHORECTOMY OMENTECTOMY LYMPHADECTOMY ;  Surgeon: Everitt Amber, MD;  Location: [facility];  Service: Gynecology;  Laterality: Right;  . TUBAL LIGATION      Family History  Problem Relation Age of Onset  . Brain cancer Sister 39  . Lung cancer Brother 2  . Heart attack Mother   . Heart attack Father        6s  . Heart disease Father   . Heart attack Maternal Uncle   . Diabetes Maternal Uncle   . Breast cancer Maternal Uncle   . Ovarian cancer Paternal Aunt        3 paternal aunts with ovarian cancer  . Heart attack Brother   . Diabetes Paternal Uncle     Social History   Tobacco Use  . Smoking status: Current Some Day Smoker    Years: 10.00    Types: Cigars  . Smokeless tobacco: Never Used  . Tobacco comment: pack per month of cigars = 5  Substance Use Topics  . Alcohol use: No    Alcohol/week: 7.0 standard drinks    Types: 7 Standard drinks or equivalent per week     Subjective:  Ms Catherine Rogers is here today requesting refill of lyrica, evaluation of sore throat I first saw her over a year ago on 09/17/17 to establish care, she was asked to return in 1  month for follow up but never returned until today. Her BP remains elevated, not on medications, was sent amlodipine at our first visit on 09/17/17 but she says she heard the medication was recalled on the news so she stopped taking it. She checks her BP regularly at home with similar readings to today's readings- 160/80. Since our last visit in 2018, she has continued regular f/u with oncology for colon, ovarian cancer surveillance; and vascular specialist for PAD with annual ABIs. She began to have sore/scratchy throat about 4-5 days ago, Monday, granddaughter has also been sick with sore throat/cold symptoms. She is maintained on lyrica 200 TID for neuropathic pain in BLE,  reports daily med compliance without noted adverse effects, although she does experience some continued pain she has been on this dosage of lyrica for some time, since at least 2016, and feels it greatly helps her pain, would like to continue, has declined to see pain management. She is requesting neurology referral for neuropathy today, says she was followed by neurology for this in the past but can not return without referral  Denies fevers, chills, sinus pain or pressure, headaches, vision changes, weakness, syncope, chest pain, shortness of breath, edema, skin discoloration, falls  BP Readings from Last 3 Encounters:  12/31/18 (!) 160/80  12/06/18 (!) 157/89  11/12/18 (!) 172/107   ROS- See HPI  Objective:  Vitals:   12/31/18 1401  BP: (!) 160/80  Pulse: 65  Temp: 97.9 F (36.6 C)  TempSrc: Oral  SpO2: 98%  Weight: 166 lb 4 oz (75.4 kg)  Height: 5' 7"  (1.702 m)    General: Well developed, well nourished, in no acute distress  Skin : Warm and dry.  Head: Normocephalic and atraumatic  Eyes: Sclera and conjunctiva clear; pupils round and reactive to light; extraocular movements intact  Oropharynx: Pink, supple. No suspicious lesions  Neck: Supple without thyromegaly, adenopathy  Lungs: Respirations unlabored;  clear to auscultation bilaterally without wheeze, rales, rhonchi  CVS exam: normal rate and regular rhythm, S1 and S2 normal.  Abdomen: Soft; nontender; nondistended; no masses or hepatosplenomegaly  Musculoskeletal: No deformities; no active joint inflammation  Extremities: No edema, cyanosis, clubbing  Vessels: Symmetric bilaterally  Neurologic: Alert and oriented; speech intact; face symmetrical; moves all extremities well; CNII-XII intact without focal deficit  Psychiatric: Normal mood and affect.  Assessment:  1. Essential hypertension   2. Neuropathy   3. Upper respiratory tract infection, unspecified type   4. Sore throat   5. PAD (peripheral artery disease) ([place])     Plan:   Upper respiratory tract infection, unspecified type, Sore throat Strep test today is neg - POCT rapid strep A-negative Suspect symptoms viral at this point, which we dicussed Home management, red flags and return precautions including when to seek immediate care discussed and printed on AVS She will f/u for new, worsening symptoms or if symptoms persist >1week     Return in about 3 weeks (around 01/21/2019) for F/U: HTN- starting losartan, recheck BP, update annual labs.  Orders Placed This Encounter  Procedures  . Ambulatory referral to Neurology    Referral Priority:   Routine    Referral Type:   Consultation    Referral Reason:   Specialty Services Required    Requested Specialty:   Neurology    Number of Visits Requested:   1  . POCT rapid strep A    Requested Prescriptions   Signed Prescriptions Disp Refills  . losartan (COZAAR) 50 MG tablet 30 tablet 1    Sig: Take 1 tablet (50 mg total) by mouth daily.  . pregabalin (LYRICA) 200 MG capsule 90 capsule 0    Sig: TAKE 1 CAPSULE BY MOUTH EVERY 8 HOURS

## 2018-12-31 NOTE — Patient Instructions (Addendum)
Start losartan 50 mg daily Return in 3-4 weeks for follow up, so that I can recheck your blood pressure and see how you are doing on your medication   lyrica refill sent  Your strep test is negative Salt water gargles, honey for throat pain Please follow up for fevers over 101, if your symptoms get worse, or if your symptoms dont get better in 1 week.   Upper Respiratory Infection, Adult An upper respiratory infection (URI) affects the nose, throat, and upper air passages. URIs are caused by germs (viruses). The most common type of URI is often called "the common cold." Medicines cannot cure URIs, but you can do things at home to relieve your symptoms. URIs usually get better within 7-10 days. Follow these instructions at home: Activity  Rest as needed.  If you have a fever, stay home from work or school until your fever is gone, or until your doctor says you may return to work or school. ? You should stay home until you cannot spread the infection anymore (you are not contagious). ? Your doctor may have you wear a face mask so you have less risk of spreading the infection. Relieving symptoms  Gargle with a salt-water mixture 3-4 times a day or as needed. To make a salt-water mixture, completely dissolve -1 tsp of salt in 1 cup of warm water.  Use a cool-mist humidifier to add moisture to the air. This can help you breathe more easily. Eating and drinking   Drink enough fluid to keep your pee (urine) pale yellow.  Eat soups and other clear broths. General instructions   Take over-the-counter and prescription medicines only as told by your doctor. These include cold medicines, fever reducers, and cough suppressants.  Do not use any products that contain nicotine or tobacco. These include cigarettes and e-cigarettes. If you need help quitting, ask your doctor.  Avoid being where people are smoking (avoid secondhand smoke).  Make sure you get regular shots and get the flu shot  every year.  Keep all follow-up visits as told by your doctor. This is important. How to avoid spreading infection to others   Wash your hands often with soap and water. If you do not have soap and water, use hand sanitizer.  Avoid touching your mouth, face, eyes, or nose.  Cough or sneeze into a tissue or your sleeve or elbow. Do not cough or sneeze into your hand or into the air. Contact a doctor if:  You are getting worse, not better.  You have any of these: ? A fever. ? Chills. ? Brown or red mucus in your nose. ? Yellow or brown fluid (discharge)coming from your nose. ? Pain in your face, especially when you bend forward. ? Swollen neck glands. ? Pain with swallowing. ? White areas in the back of your throat. Get help right away if:  You have shortness of breath that gets worse.  You have very bad or constant: ? Headache. ? Ear pain. ? Pain in your forehead, behind your eyes, and over your cheekbones (sinus pain). ? Chest pain.  You have long-lasting (chronic) lung disease along with any of these: ? Wheezing. ? Long-lasting cough. ? Coughing up blood. ? A change in your usual mucus.  You have a stiff neck.  You have changes in your: ? Vision. ? Hearing. ? Thinking. ? Mood. Summary  An upper respiratory infection (URI) is caused by a germ called a virus. The most common type of URI is  often called "the common cold."  URIs usually get better within 7-10 days.  Take over-the-counter and prescription medicines only as told by your doctor. This information is not intended to replace advice given to you by your health care provider. Make sure you discuss any questions you have with your health care provider. Document Released: 04/28/2008 Document Revised: 07/03/2017 Document Reviewed: 07/03/2017 Elsevier Interactive Patient Education  2019 [employer].

## 2019-01-03 ENCOUNTER — Encounter: Payer: Self-pay | Admitting: Nurse Practitioner

## 2019-01-03 NOTE — Assessment & Plan Note (Signed)
Discussed the long term risks of uncontrolled HTN including MI, stroke, kidney disease, and the importance of getting BP under control to prevent these complications We also discussed the need to follow up more frequently until BP control is obtained- she verbalizes understanding and seems agreeable to this plan today Discussed the role of healthy diet and exercise in the management of HTN, and daily medication was also recommended, she is agreeable to start losartan Rx- medication dosing and side effects discussed RTC in 3 - 4 weeks to recheck BP, BMET, routine labs - losartan (COZAAR) 50 MG tablet; Take 1 tablet (50 mg total) by mouth daily.  Dispense: 30 tablet; Refill: 1

## 2019-01-03 NOTE — Assessment & Plan Note (Signed)
Continue regular f/u with vascular provider

## 2019-01-03 NOTE — Assessment & Plan Note (Addendum)
Continue lyrica at current dosage-she is at max dose Controlled substance registry reviewed with no irregularities. Referral placed to neurology for further evaluation Continue regular f/u with vascular provider - Ambulatory referral to Neurology

## 2019-01-19 ENCOUNTER — Other Ambulatory Visit: Payer: Self-pay | Admitting: Nurse Practitioner

## 2019-01-22 ENCOUNTER — Other Ambulatory Visit: Payer: Self-pay | Admitting: Nurse Practitioner

## 2019-01-24 NOTE — Telephone Encounter (Signed)
[place] Controlled Database Checked Last filled: 01/21/19 # 90 LOV w/you: 12/31/18 Next appt w/you: None

## 2019-02-16 ENCOUNTER — Other Ambulatory Visit: Payer: Self-pay | Admitting: Nurse Practitioner

## 2019-02-16 DIAGNOSIS — Z1501 Genetic susceptibility to malignant neoplasm of breast: Secondary | ICD-10-CM

## 2019-02-16 DIAGNOSIS — Z1509 Genetic susceptibility to other malignant neoplasm: Principal | ICD-10-CM

## 2019-02-17 ENCOUNTER — Other Ambulatory Visit: Payer: Self-pay | Admitting: Nurse Practitioner

## 2019-02-17 DIAGNOSIS — Z1501 Genetic susceptibility to malignant neoplasm of breast: Secondary | ICD-10-CM

## 2019-02-17 DIAGNOSIS — Z1509 Genetic susceptibility to other malignant neoplasm: Principal | ICD-10-CM

## 2019-02-21 ENCOUNTER — Other Ambulatory Visit: Payer: Self-pay | Admitting: Nurse Practitioner

## 2019-02-21 NOTE — Telephone Encounter (Signed)
[place] Controlled Database Checked Last filled: 01/21/19 # 90 LOV w/PCP: 12/31/18 Next appt: None

## 2019-02-24 ENCOUNTER — Telehealth: Payer: Self-pay

## 2019-02-24 NOTE — Telephone Encounter (Signed)
Return call from Pt. In reference to medication refill on lidocaine 5% patch  Informed Pt.That per Dr. Burr Medico she should discontinue lidocaine patch and follow up with her PCP. Also scheduling note sent to schedulers for an appointment with labs for 2-3 months.

## 2019-02-24 NOTE — Telephone Encounter (Signed)
TC to Pt. About medication request received, message left for Pt. To return call to [facility].

## 2019-02-28 ENCOUNTER — Other Ambulatory Visit: Payer: Self-pay | Admitting: Nurse Practitioner

## 2019-02-28 DIAGNOSIS — Z1509 Genetic susceptibility to other malignant neoplasm: Principal | ICD-10-CM

## 2019-02-28 DIAGNOSIS — Z1501 Genetic susceptibility to malignant neoplasm of breast: Secondary | ICD-10-CM

## 2019-02-28 NOTE — Telephone Encounter (Signed)
Please advise on pt's refill request

## 2019-03-01 ENCOUNTER — Telehealth: Payer: Self-pay | Admitting: Hematology

## 2019-03-01 MED ORDER — LIDOCAINE 5 % EX PTCH: MEDICATED_PATCH | CUTANEOUS | 1 refills | Status: DC

## 2019-03-01 NOTE — Telephone Encounter (Signed)
Catherine Rogers is no longer here pls advise on refill.Marland KitchenJohny Chess

## 2019-03-01 NOTE — Telephone Encounter (Signed)
Scheduled appt per 4/2 sch message - unable to reach patient . Left message with appt date and time

## 2019-03-14 ENCOUNTER — Telehealth: Payer: Self-pay

## 2019-03-14 NOTE — Telephone Encounter (Signed)
Pt called in and stated she doesn't have any devices to go forward with a VV she wants to know if a telephone visit would work better

## 2019-03-14 NOTE — Telephone Encounter (Signed)
I called and left a detailed message for patient making her aware that due to the current COVID 19 pandemic, our office is severely limiting non-urgent visits to the office, in order to reduce exposure of the virus to our patients and staff. I offered and explained the virtual visit and asked that she call us back asap. If patient would like to go ahead with virtual visit, please get her email address.

## 2019-03-14 NOTE — Telephone Encounter (Signed)
New patients cannot be scheduled for telephone visits, it must be a virtual visit. If patient cannot do a virtual visit, she will need to be rescheduled to sometime after July 1st. Please make pt aware of this and reschedule. Thank you.

## 2019-03-15 NOTE — Telephone Encounter (Signed)
I called and left a detailed message for patient making her aware that because she is a new patient we cannot do a telephone visit. I advised her that she will need to be rescheduled to mid-June/July when we will hopefully be seeing patients in the office again.

## 2019-03-16 ENCOUNTER — Ambulatory Visit: Payer: Medicare Other | Admitting: Neurology

## 2019-03-28 ENCOUNTER — Other Ambulatory Visit: Payer: Self-pay | Admitting: *Deleted

## 2019-03-28 DIAGNOSIS — I10 Essential (primary) hypertension: Secondary | ICD-10-CM

## 2019-03-28 MED ORDER — LOSARTAN POTASSIUM 50 MG PO TABS: 50.0000 mg | ORAL_TABLET | Freq: Every day | ORAL | 1 refills | Status: DC

## 2019-03-30 ENCOUNTER — Other Ambulatory Visit: Payer: Self-pay | Admitting: Family

## 2019-03-31 ENCOUNTER — Other Ambulatory Visit: Payer: Self-pay | Admitting: Family

## 2019-03-31 ENCOUNTER — Telehealth: Payer: Self-pay

## 2019-03-31 MED ORDER — PREGABALIN 200 MG PO CAPS: 200.0000 mg | ORAL_CAPSULE | Freq: Three times a day (TID) | ORAL | 0 refills | Status: DC

## 2019-03-31 NOTE — Telephone Encounter (Signed)
Pt called requesting a refill on her Lyrica. Advised her that she would need to call her PCP for this as we are not the prescribing physicians office.   She states that her PCP is no longer in the office but I advised her that one of the partners in the office would still be able to manage the script.   York Cerise, CMA

## 2019-03-31 NOTE — Telephone Encounter (Signed)
[place] Controlled Database Checked Last filled: 02/21/19# 90 LOV: 12/31/18 Next appt: None

## 2019-03-31 NOTE — Telephone Encounter (Signed)
Done erx  Needs OV for further refills

## 2019-03-31 NOTE — Telephone Encounter (Signed)
Copied from [place] [phone]. Topic: Quick Communication - Rx Refill/Question >> Mar 31, 2019  2:53 PM Ahmed Prima L wrote: Medication: pregabalin (LYRICA) 200 MG capsule  Has the patient contacted their pharmacy? Yes  (Agent: If no, request that the patient contact the pharmacy for the refill.) (Agent: If yes, when and what did the pharmacy advise?)  Preferred Pharmacy (with phone number or street name): [phone] - [place], [address] [place] [address] Phone: [phone] Fax: [phone]    Agent: Please be advised that RX refills may take up to 3 business days. We ask that you follow-up with your pharmacy.

## 2019-04-07 NOTE — Telephone Encounter (Signed)
Patient is scheduled for transfer of care with Jodi Mourning, NP on 06/17/19.

## 2019-04-28 NOTE — Progress Notes (Addendum)
[place]   [phone] [phone]   Clinic Follow up Note   Patient Care Team: Lance Sell, NP as PCP - General (Nurse Practitioner) Truitt Merle, MD as Consulting Physician (Hematology) Judeth Horn, MD as Consulting Physician (General Surgery) Nat Math, MD as Referring Physician (Obstetrics and Gynecology) Elam Dutch, MD as Consulting Physician (Vascular Surgery) [name], MD as Attending Physician (Gynecologic Oncology)  Date of Service:  05/02/2019  CHIEF COMPLAINT:  Follow up on  1. Colon cancer, pT3N1M0, stage III 2. Ovarian cancer, pT1cNxM0, stage I  3. BRCA1 mutation (+)  SUMMARY OF ONCOLOGIC HISTORY:   Cancer of right colon ([place])   09/16/2014 Imaging    CT abd/pel:  a 3.8cm cecal mass with associated ileocecal intussusception, and a 9.1cm solid and cystic mass in the left pelvis,    09/18/2014 Initial Diagnosis    Adenocarcinoma of right colon    09/18/2014 Pathologic Stage    pT3pN1Mx, tumor extends into pericolonic soft tissue and is less than 82m from the serosal surface, LVI 8-), PNI (-), all 23 node negative, one soft tissue tumor deposit, surgical margins negative.     09/18/2014 Surgery    right hemicolectomy and terminal ileoectomy    08/06/2016 Imaging    CT chest/abd/pelvis with contrast IMPRESSION: 1. No recurrent malignancy identified. 2. 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 On the prior exam from 09/13/2015 there was back flow of contrast in the left external iliac artery to supply the left internal iliac artery ; this back flow of contrast is no longer present in the left external iliac artery is occluded. 3. Several pulmonary nodules at or below 4 mm diameter, unchanged. 4. 3 mm hypodensity posteriorly in the body of the pancreas, not well seen previously and subtle today, likely incidental, but  merits observation. 5. 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 Prominent degenerative spurring of both acetabula. 6. Lumbar spondylosis and degenerative disc disease causing prominent impingement at L4-5 and mild impingement at L5-S1.    03/08/2017 Imaging    CT CAP W Contrast 03/08/17 IMPRESSION: Chest Impression: 1. Stable small pulmonary nodules within LEFT lung. 2. No mediastinal lymphadenopathy. Abdomen / Pelvis Impression: 1. No evidence of metastatic colorectal cancer or ovarian cancer within the abdomen pelvis. 2. No lymphadenopathy. 3. Postsurgical change consistent RIGHT hemicolectomy and hysterectomy 4. A fem-fem arterial bypass noted.    11/13/2017 Imaging    CT AP w contrast IMPRESSION: 1. No findings of recurrent malignancy in the abdomen or pelvis. 2. Other imaging findings of potential clinical significance: Chronic AVN of both femoral heads without contour abnormality. Lumbar impingement at L4-5 and L5-S1. Aortic Atherosclerosis (ICD10-I70.0). 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 Partial right hemicolectomy. Hysterectomy.     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 ([place]   10/09/2014 Imaging    CT of the abdomen and pelvis showed a 9.1 cm solid and cystic mass in the left pelvis concerning for malignancy. This was discovered during her workup for colon cancer.    11/01/2014 Initial Diagnosis    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    11/01/2014 Surgery    B/l Salpingo-oophorectomy.    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 Tumor size 3.7 cm, no lymph nodes removed.  T1cNx. Tumor cells are positive for cytokeratin 7 and estrogen receptor.    03/08/2017 Imaging    CT CAP W Contrast 03/08/17 IMPRESSION: Chest Impression: 1. Stable small pulmonary nodules within LEFT  lung. 2. No mediastinal lymphadenopathy. Abdomen / Pelvis Impression: 1. No evidence of metastatic colorectal cancer or ovarian cancer within the abdomen pelvis. 2. No lymphadenopathy. 3. Postsurgical change consistent RIGHT hemicolectomy and hysterectomy 4. A fem-fem arterial bypass noted.    03/12/2017 Mammogram    MM Right Breast 03/12/17 IMPRESSION: No mammographic or sonographic evidence of malignancy. No correlate identified for the abnormal enhancement seen within the right nipple on MRI of 02/27/2017.     04/02/2017 Pathology Results    Surgical Pathology of Nipple Biopsy: 04/02/17 Diagnosis Nipple Biopsy, Right - BENIGN NIPPLE TISSUE. - NO ATYPIA OR MALIGNANCY.     03/22/2018 Mammogram    Screening mammogram IMPRESSION No evidence of malignancy.        CURRENT THERAPY:  Surveillance  INTERVAL HISTORY:  Jazmyn Offner is here for a follow up of colon cancer. She presents to the clinic alone. She notes she is doing well. She is exercising and eating well. She notes her left hip pain still persists. She smokes marijuana daily for her pain. She notes she no longer takes losartan because she could not tolerate. She plans to see her new PCP soon.  She denies breast feeding due to her breast not lactating. She denies history of any nipple discharge. She notes she still smokes Cigars.    REVIEW OF SYSTEMS:   Constitutional: Denies fevers, chills or abnormal weight loss Eyes: Denies blurriness of vision Ears, nose, mouth, throat, and face: Denies mucositis or sore throat Respiratory: Denies cough, dyspnea or wheezes Cardiovascular: Denies palpitation, chest discomfort or lower extremity swelling Gastrointestinal:  Denies nausea, heartburn or change in bowel habits Skin: Denies abnormal skin rashes MSK: (+) Left hip pain  Lymphatics: Denies new lymphadenopathy or easy bruising Neurological:Denies numbness, tingling or new weaknesses Behavioral/Psych: Mood is stable,  no new changes  All other systems were reviewed with the patient and are negative.  MEDICAL HISTORY:  Past Medical History:  Diagnosis Date   Anemia    Anxiety    Arthritis    Asthma    Cancer ([place])    colon and ovarian   Complication of anesthesia    woke and saw long tube coming out of mouth . sat up then went back to sleep   Depression    Ectopic pregnancy    Family history of adverse reaction to anesthesia    sister on dialysis- hypotension    Family history of ovarian cancer    Head injury, closed, with brief LOC ([place])    had a cut- as a child   Headache    Hepatitis    being treated for Hep C   History of blood transfusion    2005 approximately    Hypertension    no meds now   PAD (peripheral artery disease) ([place]) 04/18/2015   Pelvic cyst    PONV (postoperative nausea and vomiting)    PTSD (post-traumatic stress disorder)     SURGICAL HISTORY: Past Surgical History:  Procedure Laterality Date   ABDOMINAL HYSTERECTOMY     BREAST BIOPSY Right 04/02/2017   Procedure: INCISIONAL BIOPSY RIGHT NIPPLE;  Surgeon: Coralie Keens, MD;  Location: [facility];  Service: General;  Laterality: Right;   COLONOSCOPY     ESOPHAGOGASTRODUODENOSCOPY N/A 09/17/2014   Procedure: ESOPHAGOGASTRODUODENOSCOPY (EGD);  Surgeon: Jeryl Columbia, MD;  [facility];  Service: Endoscopy;  Laterality: N/A;   FEMORAL-FEMORAL BYPASS GRAFT Bilateral 04/18/2015   Procedure:  RIGHT COMMON FEMORAL TO LEFT COMMON  FEMORAL  ARTERY BYPASS WITH  8 MM HEMASHIELD GRAFT,RIGHT COMMON SUPERFICIAL FEMORAL ARTERY ENDARTERECTOMY;  Surgeon: Elam Dutch, MD;  Location: [facility];  Service: Vascular;  Laterality: Bilateral;   ILEOSTOMY Right 09/18/2014   Procedure: ILEOCOLOSTOMY;  Surgeon: Doreen Salvage, MD;  Location: [facility];  Service: General;  Laterality: Right;   LAPAROTOMY N/A 11/01/2014   Procedure: EXPLORATORY LAPAROTOMY;  Surgeon: Doreen Salvage, MD;  Location: [facility];   Service: General;  Laterality: N/A;   LAPAROTOMY N/A 12/26/2014   Procedure:  LAPAROTOMY ;  Surgeon: Everitt Amber, MD;  Location: [facility];  Service: Gynecology;  Laterality: N/A;   MASS EXCISION  11/01/2014   Procedure: EXCISION PELVIC MASS;  Surgeon: Doreen Salvage, MD;  Location: [place];  Service: General;;   OOPHORECTOMY  2016   PARTIAL COLECTOMY Right 09/18/2014   Procedure: RIGHT HEMI COLECTOMY;  Surgeon: Doreen Salvage, MD;  Location: [facility];  Service: General;  Laterality: Right;   ROBOTIC ASSISTED TOTAL HYSTERECTOMY WITH BILATERAL SALPINGO OOPHERECTOMY Right 12/26/2014   Procedure: EXPLORATORY LAPAROSCOPY  RIGHT SALPINGO OOPHORECTOMY OMENTECTOMY LYMPHADECTOMY ;  Surgeon: Everitt Amber, MD;  Location: [facility];  Service: Gynecology;  Laterality: Right;   TUBAL LIGATION      I have reviewed the social history and family history with the patient and they are unchanged from previous note.  ALLERGIES:  is allergic to other; demerol [meperidine]; morphine and related; oxycodone; penicillins; tylenol [acetaminophen]; and ibuprofen.  MEDICATIONS:  Current Outpatient Medications  Medication Sig Dispense Refill   acetaminophen (TYLENOL) 325 MG tablet Take 325 mg by mouth as needed.     docusate sodium 100 MG CAPS Take 100 mg by mouth 2 (two) times daily as needed for mild constipation. 10 capsule 0   lidocaine (LIDODERM) 5 % PLACE 1 PATCH ONTO THE SKIN EVERY DAY.REMOVE AND DISCARD PATCH WITHIN 12 HOURS OR AS DIRECTED BY PRESCRIBER 30 patch 1   LORazepam (ATIVAN) 0.5 MG tablet Take 1 tablet (0.5 mg total) by mouth 3 (three) times daily as needed for anxiety. 10 tablet 0   losartan (COZAAR) 50 MG tablet Take 1 tablet (50 mg total) by mouth daily. Need to establish with new provider for future refills. 30 tablet 1   ondansetron (ZOFRAN) 4 MG tablet TAKE 1 TABLET BY MOUTH EVERY 8 HOURS AS NEEDED FOR NAUSEA OR VOMITING 20 tablet 0   pregabalin (LYRICA) 200 MG capsule Take 1 capsule (200 mg total) by mouth  every 8 (eight) hours. 90 capsule 0   No current facility-administered medications for this visit.     PHYSICAL EXAMINATION: ECOG PERFORMANCE STATUS: 1 - Symptomatic but completely ambulatory  Vitals:   05/02/19 1246  BP: (!) 150/80  Pulse: 76  Resp: 20  Temp: 98.2 F (36.8 C)  SpO2: 100%   Filed Weights   05/02/19 1246  Weight: 175 lb 3.2 oz (79.5 kg)    GENERAL:alert, no distress and comfortable SKIN: skin color, texture, turgor are normal, no rashes or significant lesions EYES: normal, Conjunctiva are pink and non-injected, sclera clear  NECK: supple, thyroid normal size, non-tender, without nodularity LYMPH:  no palpable lymphadenopathy in the cervical, axillary  LUNGS: clear to auscultation and percussion with normal breathing effort HEART: regular rate & rhythm and no murmurs and no lower extremity edema ABDOMEN:abdomen soft, non-tender  and normal bowel sounds Musculoskeletal:no cyanosis of digits and no clubbing  NEURO: alert & oriented x 3 with fluent speech, no focal motor/sensory deficits  LABORATORY DATA:  I have reviewed the data as listed CBC Latest Ref Rng & Units 05/02/2019 11/12/2018 04/08/2018  WBC 4.0 - 10.5 K/uL 5.3 6.0 5.9  Hemoglobin 12.0 - 15.0 g/dL 15.7(H) 16.3(H) 14.6  Hematocrit 36.0 - 46.0 % 43.9 45.9 41.8  Platelets 150 - 400 K/uL 150 159 168     CMP Latest Ref Rng & Units 05/02/2019 11/12/2018 04/08/2018  Glucose 70 - 99 mg/dL 91 96 82  BUN 6 - 20 mg/dL _0 Creatinine 0.44 - 1.00 mg/dL 0.98 0.88 1.01  Sodium 135 - 145 mmol/L 143 142 142  Potassium 3.5 - 5.1 mmol/L 4.1 4.2 5.2(H)  Chloride 98 - 111 mmol/L 107 106 107  CO2 22 - 32 mmol/L 25 27 30(H)  Calcium 8.9 - 10.3 mg/dL 9.3 9.5 9.7  Total Protein 6.5 - 8.1 g/dL 8.1 8.3(H) 7.8  Total Bilirubin 0.3 - 1.2 mg/dL 0.4 0.6 0.4  Alkaline Phos 38 - 126 U/L 77 68 79  AST 15 - 41 U/L _1 ALT 0 - 44 U/L _2 RADIOGRAPHIC STUDIES: I have personally reviewed the radiological  images as listed and agreed with the findings in the report. No results found.   ASSESSMENT & PLAN:  Maeola Mchaney is a 58 y.o. female with   1. Right colon cancer, PT3pN1M0, stage III, MSI-stable  -She was diagnosed in 08/2014. She is s/p right hemicolectomy.  -Her tumor microsatellite stability test showed stable, she unlikely has Lynch syndrome. -Although I previously recommended adjuvant chemotherapy, which is standard care for stage III colon cancer, her chemotherapy was postponed due to her multiple surgery, significance of pain after surgeries, and compliance issue (multiple no show), she was 5 months out of initial colon surgery when she returned for follow up after surgery, and the benefit of adjuvant chemotherapy was minimal at that point. So she did not receive adjuvant chemotherapy -She will continue with cancer surveillance.  -From a cancer standpoint she is clinically doing well. Labs reviewed, CBC WNL except hg 15.7, CMP WNL. CEA and Ca 125 still pending. Physical exam normal. There is no clinical concern for recurrence.  -She is almost 5 years since diagnosis, her risk of recurrence is significantly decreased now. Will continue surveillance.  -F/u in 6 months then yearly.  -she is overdue for her colonoscopy, I encourage her to f/u with GI.    2. Ovary cancer, left, pT1 cNX M0, at least stage I, mixed endometrioid and clear cell histology, grade 3, ER positive. -She was diagnosed in 2015. She underwent bilateral oophorectomy and pelvic lymph nodes biopsy for staging, which wall over negative. -She would have proceeded with standard chemotherapy carboplatin and paclitaxel for ovarian cancer. However, 3 months out of her initial ovary surgery, and she developed left lower extremity neuropathy and due to her limited PS and pain issue, she is also not a good candidate for chemo.  -We'll follow up CA125.  -She was last seen by Dr. Alycia Rossetti in 11/2016 who has left the practice  -She  is 4.5 years since her diagnosis, risk of recurrence is very small now.  3. BRCA1 mutation carrier, s/p BSO -I previously discussed extensively about the risk of breast cancer (57-65% by age of 75), and I recommend prophylactic bilateral mastectomy, but she declined. She  agrees to have annual screening mammogram and breast MRI -She has 6 children, all were tested, all tested negative per pt.   -We previously discussed chemoprevention Tamoxifen to reduce the risk of breast cancer in the future. We previously discussed the risks and benefits. The patient has declined. -Her 02/2018 mammogram was normal. She is overdue for annual mammogram secondary to the COVID-19 pandemic. Will do next mammogram in 04/2019 and breast MRI in 10/2019. She will schedule.   4. Abdominal and left lower extremity neuropathy , left hip pain -Likely related to her peripheral vascular disease and a neuropathy of the surgery. -continues to persist. She smokes Marijuana daily to help her pain. Currently manageable.  4. PVD -She will follow up with Dr. Eden Lathe   5. Coping and social support, history of depression and PTSD -She has had some difficulty coping with her synchronized colon and ovarian cancer. She lives alone, does not want her children to be involved much in her cancer care.  -Her mood has much improved over time, she is coping much better lately.  6. HTN -The patient is being followed by her PCP. Due to poor toleration she stopped losartan on her own.  -Her BP at 150/80 today (05/02/19).  -I discussed if her BP persistently elevates she should see PCP to start on medication.    Plan -She is clinically doing well, will continue surveillance -F/u in 6 months with lab  -Mammogram at [facility] in 2 weeks -Breast MRI at [facility] in 6 months    No problem-specific Assessment & Plan notes found for this encounter.   Orders Placed This Encounter  Procedures   MR BREAST BILATERAL W WO CONTRAST INC CAD    Standing  Status:   Future    Standing Expiration Date:   07/01/2020    Order Specific Question:   If indicated for the ordered procedure, I authorize the administration of contrast media per Radiology protocol    Answer:   Yes    Order Specific Question:   What is the patient's sedation requirement?    Answer:   No Sedation    Order Specific Question:   Does the patient have a pacemaker or implanted devices?    Answer:   No    Order Specific Question:   Radiology Contrast Protocol - do NOT remove file path    Answer:   \[email or web address]    Order Specific Question:   Preferred imaging location?    Answer:   [facility] (table limit-350lbs)   MM Digital Screening    Standing Status:   Future    Standing Expiration Date:   05/01/2020    Scheduling Instructions:     Pt is overdue, please schedule asap, thanks    Order Specific Question:   Reason for Exam (SYMPTOM  OR DIAGNOSIS REQUIRED)    Answer:   screening    Order Specific Question:   Is the patient pregnant?    Answer:   No    Order Specific Question:   Preferred imaging location?    Answer:   [facility]   [facility] (IN HOUSE-[facility])   All questions were answered. The patient knows to call the clinic with any problems, questions or concerns. No barriers to learning was detected. I spent 20 minutes counseling the patient face to face. The total time spent in the appointment was 25 minutes and more than 50% was on counseling and review of test results     Truitt Merle, MD  05/02/2019   I, Joslyn Devon, am acting as scribe for Truitt Merle, MD.   I have reviewed the above documentation for accuracy and completeness, and I agree with the above.    Addendum  Her tumor marker CA125 came back at 360, near 10 times high than normal limits, it was normal in the past. I will get a CT abd/pel with contrast in next 1-2 weeks to rule out recurrence.  Truitt Merle  05/03/2019

## 2019-05-02 ENCOUNTER — Other Ambulatory Visit: Payer: Self-pay

## 2019-05-02 ENCOUNTER — Encounter: Payer: Self-pay | Admitting: Hematology

## 2019-05-02 ENCOUNTER — Inpatient Hospital Stay: Payer: Medicare Other

## 2019-05-02 ENCOUNTER — Inpatient Hospital Stay: Payer: Medicare Other | Attending: Hematology | Admitting: Hematology

## 2019-05-02 VITALS — BP 150/80 | HR 76 | Temp 98.2°F | Resp 20 | Ht 67.0 in | Wt 175.2 lb

## 2019-05-02 DIAGNOSIS — F4310 Post-traumatic stress disorder, unspecified: Secondary | ICD-10-CM | POA: Diagnosis not present

## 2019-05-02 DIAGNOSIS — I10 Essential (primary) hypertension: Secondary | ICD-10-CM | POA: Diagnosis not present

## 2019-05-02 DIAGNOSIS — I700 Atherosclerosis of aorta: Secondary | ICD-10-CM | POA: Diagnosis not present

## 2019-05-02 DIAGNOSIS — M5137 Other intervertebral disc degeneration, lumbosacral region: Secondary | ICD-10-CM | POA: Diagnosis not present

## 2019-05-02 DIAGNOSIS — G5792 Unspecified mononeuropathy of left lower limb: Secondary | ICD-10-CM | POA: Diagnosis not present

## 2019-05-02 DIAGNOSIS — Z880 Allergy status to penicillin: Secondary | ICD-10-CM | POA: Insufficient documentation

## 2019-05-02 DIAGNOSIS — Z79899 Other long term (current) drug therapy: Secondary | ICD-10-CM | POA: Insufficient documentation

## 2019-05-02 DIAGNOSIS — Z886 Allergy status to analgesic agent status: Secondary | ICD-10-CM | POA: Insufficient documentation

## 2019-05-02 DIAGNOSIS — M47816 Spondylosis without myelopathy or radiculopathy, lumbar region: Secondary | ICD-10-CM | POA: Insufficient documentation

## 2019-05-02 DIAGNOSIS — Z885 Allergy status to narcotic agent status: Secondary | ICD-10-CM | POA: Diagnosis not present

## 2019-05-02 DIAGNOSIS — C182 Malignant neoplasm of ascending colon: Secondary | ICD-10-CM | POA: Diagnosis not present

## 2019-05-02 DIAGNOSIS — F17290 Nicotine dependence, other tobacco product, uncomplicated: Secondary | ICD-10-CM | POA: Insufficient documentation

## 2019-05-02 DIAGNOSIS — F1290 Cannabis use, unspecified, uncomplicated: Secondary | ICD-10-CM | POA: Insufficient documentation

## 2019-05-02 DIAGNOSIS — Z170 Estrogen receptor positive status [ER+]: Secondary | ICD-10-CM | POA: Diagnosis not present

## 2019-05-02 DIAGNOSIS — F419 Anxiety disorder, unspecified: Secondary | ICD-10-CM | POA: Diagnosis not present

## 2019-05-02 DIAGNOSIS — M25552 Pain in left hip: Secondary | ICD-10-CM

## 2019-05-02 DIAGNOSIS — Z1509 Genetic susceptibility to other malignant neoplasm: Secondary | ICD-10-CM

## 2019-05-02 DIAGNOSIS — Z1501 Genetic susceptibility to malignant neoplasm of breast: Secondary | ICD-10-CM | POA: Diagnosis not present

## 2019-05-02 DIAGNOSIS — Z90710 Acquired absence of both cervix and uterus: Secondary | ICD-10-CM | POA: Diagnosis not present

## 2019-05-02 DIAGNOSIS — I739 Peripheral vascular disease, unspecified: Secondary | ICD-10-CM | POA: Diagnosis not present

## 2019-05-02 DIAGNOSIS — C562 Malignant neoplasm of left ovary: Secondary | ICD-10-CM | POA: Diagnosis not present

## 2019-05-02 DIAGNOSIS — Z1231 Encounter for screening mammogram for malignant neoplasm of breast: Secondary | ICD-10-CM

## 2019-05-02 DIAGNOSIS — F329 Major depressive disorder, single episode, unspecified: Secondary | ICD-10-CM | POA: Insufficient documentation

## 2019-05-02 LAB — COMPREHENSIVE METABOLIC PANEL
ALT: 9 U/L (ref 0–44)
AST: 19 U/L (ref 15–41)
Albumin: 4.3 g/dL (ref 3.5–5.0)
Alkaline Phosphatase: 77 U/L (ref 38–126)
Anion gap: 11 (ref 5–15)
BUN: 13 mg/dL (ref 6–20)
CO2: 25 mmol/L (ref 22–32)
Calcium: 9.3 mg/dL (ref 8.9–10.3)
Chloride: 107 mmol/L (ref 98–111)
Creatinine, Ser: 0.98 mg/dL (ref 0.44–1.00)
GFR calc Af Amer: >60 mL/min (ref >60)
GFR calc non Af Amer: >60 mL/min (ref >60)
Glucose, Bld: 91 mg/dL (ref 70–99)
Potassium: 4.1 mmol/L (ref 3.5–5.1)
Sodium: 143 mmol/L (ref 135–145)
Total Bilirubin: 0.4 mg/dL (ref 0.3–1.2)
Total Protein: 8.1 g/dL (ref 6.5–8.1)

## 2019-05-02 LAB — CBC WITH DIFFERENTIAL/PLATELET
Abs Immature Granulocytes: 0.01 10*3/uL (ref 0.00–0.07)
Basophils Absolute: 0 10*3/uL (ref 0.0–0.1)
Basophils Relative: 1 %
Eosinophils Absolute: 0.1 10*3/uL (ref 0.0–0.5)
Eosinophils Relative: 1 %
HCT: 43.9 % (ref 36.0–46.0)
Hemoglobin: 15.7 g/dL — ABNORMAL HIGH (ref 12.0–15.0)
Immature Granulocytes: 0 %
Lymphocytes Relative: 51 %
Lymphs Abs: 2.7 10*3/uL (ref 0.7–4.0)
MCH: 35.6 pg — ABNORMAL HIGH (ref 26.0–34.0)
MCHC: 35.8 g/dL (ref 30.0–36.0)
MCV: 99.5 fL (ref 80.0–100.0)
Monocytes Absolute: 0.4 10*3/uL (ref 0.1–1.0)
Monocytes Relative: 8 %
Neutro Abs: 2 10*3/uL (ref 1.7–7.7)
Neutrophils Relative %: 39 %
Platelets: 150 10*3/uL (ref 150–400)
RBC: 4.41 MIL/uL (ref 3.87–5.11)
RDW: 12 % (ref 11.5–15.5)
WBC: 5.3 10*3/uL (ref 4.0–10.5)
nRBC: 0 % (ref 0.0–0.2)

## 2019-05-02 LAB — CEA (IN HOUSE-CHCC): CEA (CHCC-In House): 1.7 ng/mL (ref 0.00–5.00)

## 2019-05-03 ENCOUNTER — Telehealth: Payer: Self-pay | Admitting: Hematology

## 2019-05-03 ENCOUNTER — Telehealth: Payer: Self-pay | Admitting: *Deleted

## 2019-05-03 LAB — CA 125: Cancer Antigen (CA) 125: 360 U/mL — ABNORMAL HIGH (ref 0.0–38.1)

## 2019-05-03 NOTE — Telephone Encounter (Signed)
-----   Message from Elliot Gault sent at 05/03/2019 12:27 PM EDT ----- Josem Kaufmann approved. ----- Message ----- From: Truitt Merle, MD Sent: 05/03/2019  11:39 AM EDT To: April H Pait, Jesse Fall, RN, #  Lockie Bothun,  Please let pt know that her tumor marker CA125 elevated significantly yesterday, and I will order a CT ABD/PEL with contrast to be done in 1-2 weeks  Thanks   Krista Blue

## 2019-05-03 NOTE — Addendum Note (Signed)
Addended by: Truitt Merle on: 05/03/2019 11:44 AM   Modules accepted: Orders

## 2019-05-03 NOTE — Telephone Encounter (Signed)
Left message for pt to return call.

## 2019-05-03 NOTE — Telephone Encounter (Signed)
Scheduled appt per 6/8 los.

## 2019-05-05 NOTE — Telephone Encounter (Signed)
Notified pt of elevated CA125 & need to have CT.  This has been scheduled for 05/24/19 & she already knew about this.  She expressed understanding.

## 2019-05-11 ENCOUNTER — Telehealth: Payer: Self-pay | Admitting: Nurse Practitioner

## 2019-05-11 NOTE — Telephone Encounter (Signed)
Medication Refill - Medication: pregabalin (LYRICA) 200 MG capsule   Has the patient contacted their pharmacy? Yes.   (Agent: If no, request that the patient contact the pharmacy for the refill.) (Agent: If yes, when and what did the pharmacy advise?) several request sent to provider with no response. 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 We ask that you follow-up with your pharmacy.

## 2019-05-12 NOTE — Telephone Encounter (Signed)
Pt called back in about the status of the med refill

## 2019-05-12 NOTE — Telephone Encounter (Signed)
Will have Beverely Risen., NP review when she returns to office on 05/13/19 since patient is scheduled to see her 06/17/19.

## 2019-05-12 NOTE — Telephone Encounter (Signed)
[place] Controlled Database Checked Last filled: 03/31/19 # 90 Next appt w/you: 06/17/19

## 2019-05-13 ENCOUNTER — Other Ambulatory Visit: Payer: Self-pay | Admitting: Family

## 2019-05-13 MED ORDER — PREGABALIN 200 MG PO CAPS: 200.0000 mg | ORAL_CAPSULE | Freq: Three times a day (TID) | ORAL | 0 refills | Status: DC

## 2019-05-13 NOTE — Telephone Encounter (Signed)
She was referred to neurology per her request to establish and continue care regarding the Lyrica. We will not be writing this long term- I will write this one time but she was supposed to establish with [facility] in June. She should call them to get set up.

## 2019-05-16 NOTE — Telephone Encounter (Signed)
Left message for patient to call back to inform of below.

## 2019-05-16 NOTE — Telephone Encounter (Signed)
Pt informed of below by Herbie Drape., [facility] scheduler.

## 2019-05-24 ENCOUNTER — Other Ambulatory Visit: Payer: Self-pay

## 2019-05-24 ENCOUNTER — Ambulatory Visit (HOSPITAL_COMMUNITY)
Admission: RE | Admit: 2019-05-24 | Discharge: 2019-05-24 | Disposition: A | Discharge status: Home / Self Care | Payer: Medicare Other | Source: Ambulatory Visit | Attending: Hematology | Admitting: Hematology

## 2019-05-24 DIAGNOSIS — C562 Malignant neoplasm of left ovary: Secondary | ICD-10-CM

## 2019-05-24 DIAGNOSIS — C786 Secondary malignant neoplasm of retroperitoneum and peritoneum: Secondary | ICD-10-CM | POA: Diagnosis not present

## 2019-05-24 MED ORDER — IOHEXOL 300 MG/ML  SOLN
100.0000 mL | Freq: Once | INTRAMUSCULAR | Status: AC | PRN
  Administered 2019-05-24: 100 mL via INTRAVENOUS

## 2019-05-25 ENCOUNTER — Ambulatory Visit (HOSPITAL_COMMUNITY)
Admission: RE | Admit: 2019-05-25 | Discharge: 2019-05-25 | Disposition: A | Discharge status: Home / Self Care | Payer: Medicare Other | Source: Ambulatory Visit | Attending: Hematology | Admitting: Hematology

## 2019-05-25 ENCOUNTER — Encounter (HOSPITAL_COMMUNITY): Payer: Self-pay

## 2019-05-25 DIAGNOSIS — Z1231 Encounter for screening mammogram for malignant neoplasm of breast: Secondary | ICD-10-CM | POA: Diagnosis not present

## 2019-05-30 ENCOUNTER — Telehealth: Payer: Self-pay | Admitting: Nurse Practitioner

## 2019-05-30 NOTE — Telephone Encounter (Signed)
I called Ms. Ospina to review CT scan and recommendations. No answer. I left generic message requesting she call back.  Cira Rue, NP 05/30/2019

## 2019-05-31 ENCOUNTER — Telehealth: Payer: Self-pay | Admitting: Nurse Practitioner

## 2019-05-31 NOTE — Telephone Encounter (Signed)
Second unsuccessful attempt to reach patient regarding CT result and recommendations. Will continue to try to reach patient. Left message to return my call. Cira Rue, NP  05/31/2019

## 2019-06-01 ENCOUNTER — Telehealth: Payer: Self-pay | Admitting: Hematology

## 2019-06-01 ENCOUNTER — Other Ambulatory Visit: Payer: Self-pay | Admitting: Hematology

## 2019-06-01 DIAGNOSIS — Z1501 Genetic susceptibility to malignant neoplasm of breast: Secondary | ICD-10-CM

## 2019-06-01 DIAGNOSIS — Z1509 Genetic susceptibility to other malignant neoplasm: Secondary | ICD-10-CM

## 2019-06-01 DIAGNOSIS — C562 Malignant neoplasm of left ovary: Secondary | ICD-10-CM

## 2019-06-01 MED ORDER — LIDOCAINE 5 % EX PTCH: MEDICATED_PATCH | CUTANEOUS | 1 refills | Status: DC

## 2019-06-01 NOTE — Telephone Encounter (Signed)
Pt returned the call. I spoke with her and reviewed her recent lab and CT abdomen pelvis scan findings.  Unfortunately she has significantly elevated Ca1 25 lately, and peritoneal soft tissues on CT, highly suspicious for recurrent ovarian cancer.  I recommend IR biopsy of peritoneal nodule, and a CT chest with contrast to complete staging.  If biopsy confirm ovarian cancer, I will refer her to see my partner Dr. Alvy Bimler for treatment. Pt was calm when I discussed the above, she "knew something is going on", has mild constipation and abdominal pain lately. She is very reluctant to have chemo, and does not want to Dr. Denman George to have surgery on her again, but open to see other GYN oncologist.  She voiced good understanding about above, and agrees to proceed biopsy and CT chest.  I have discussed with Dr. Alvy Bimler and she will be happy to see her if biopsy confirm ovarian cancer.   Truitt Merle  06/01/2019

## 2019-06-02 ENCOUNTER — Other Ambulatory Visit: Payer: Self-pay | Admitting: Hematology and Oncology

## 2019-06-02 ENCOUNTER — Other Ambulatory Visit: Payer: Self-pay

## 2019-06-02 DIAGNOSIS — C562 Malignant neoplasm of left ovary: Secondary | ICD-10-CM

## 2019-06-03 ENCOUNTER — Telehealth: Payer: Self-pay

## 2019-06-03 ENCOUNTER — Other Ambulatory Visit: Payer: Self-pay | Admitting: Hematology

## 2019-06-03 MED ORDER — HYDROCODONE-ACETAMINOPHEN 5-300 MG PO TABS: 1.0000 | ORAL_TABLET | Freq: Three times a day (TID) | ORAL | 0 refills | Status: DC | PRN

## 2019-06-03 NOTE — Progress Notes (Unsigned)
tylynol

## 2019-06-03 NOTE — Telephone Encounter (Signed)
Patient called stating she would like something called in for pain to the [facility] in [place] on file.

## 2019-06-06 ENCOUNTER — Ambulatory Visit (HOSPITAL_COMMUNITY)
Admission: RE | Admit: 2019-06-06 | Discharge: 2019-06-06 | Disposition: A | Discharge status: Home / Self Care | Payer: Medicare Other | Source: Ambulatory Visit | Attending: Hematology | Admitting: Hematology

## 2019-06-06 ENCOUNTER — Other Ambulatory Visit: Payer: Self-pay

## 2019-06-06 ENCOUNTER — Other Ambulatory Visit: Payer: Self-pay | Admitting: Student

## 2019-06-06 ENCOUNTER — Encounter (HOSPITAL_COMMUNITY): Payer: Self-pay

## 2019-06-06 DIAGNOSIS — C189 Malignant neoplasm of colon, unspecified: Secondary | ICD-10-CM | POA: Diagnosis not present

## 2019-06-06 DIAGNOSIS — Z8543 Personal history of malignant neoplasm of ovary: Secondary | ICD-10-CM | POA: Diagnosis not present

## 2019-06-06 DIAGNOSIS — Z85038 Personal history of other malignant neoplasm of large intestine: Secondary | ICD-10-CM | POA: Diagnosis not present

## 2019-06-06 DIAGNOSIS — C569 Malignant neoplasm of unspecified ovary: Secondary | ICD-10-CM | POA: Diagnosis not present

## 2019-06-06 DIAGNOSIS — C786 Secondary malignant neoplasm of retroperitoneum and peritoneum: Secondary | ICD-10-CM | POA: Diagnosis not present

## 2019-06-06 DIAGNOSIS — C562 Malignant neoplasm of left ovary: Secondary | ICD-10-CM | POA: Insufficient documentation

## 2019-06-06 MED ORDER — IOHEXOL 300 MG/ML  SOLN
75.0000 mL | Freq: Once | INTRAMUSCULAR | Status: AC | PRN
  Administered 2019-06-06: 75 mL via INTRAVENOUS

## 2019-06-06 MED ORDER — SODIUM CHLORIDE (PF) 0.9 % IJ SOLN
INTRAMUSCULAR | Status: AC
  Filled 2019-06-06: qty 50

## 2019-06-07 ENCOUNTER — Other Ambulatory Visit: Payer: Self-pay | Admitting: Hematology

## 2019-06-07 ENCOUNTER — Telehealth: Payer: Self-pay | Admitting: Oncology

## 2019-06-07 ENCOUNTER — Ambulatory Visit (HOSPITAL_COMMUNITY)
Admission: RE | Admit: 2019-06-07 | Discharge: 2019-06-07 | Disposition: A | Discharge status: Home / Self Care | Payer: Medicare Other | Source: Ambulatory Visit | Attending: Hematology | Admitting: Hematology

## 2019-06-07 ENCOUNTER — Encounter (HOSPITAL_COMMUNITY): Payer: Self-pay

## 2019-06-07 ENCOUNTER — Ambulatory Visit (HOSPITAL_COMMUNITY)
Admission: RE | Admit: 2019-06-07 | Discharge: 2019-06-07 | Disposition: A | Discharge status: Home / Self Care | Payer: Medicare Other | Source: Ambulatory Visit | Attending: Obstetrics and Gynecology | Admitting: Obstetrics and Gynecology

## 2019-06-07 DIAGNOSIS — B1920 Unspecified viral hepatitis C without hepatic coma: Secondary | ICD-10-CM | POA: Diagnosis not present

## 2019-06-07 DIAGNOSIS — Z8543 Personal history of malignant neoplasm of ovary: Secondary | ICD-10-CM | POA: Diagnosis not present

## 2019-06-07 DIAGNOSIS — Z85038 Personal history of other malignant neoplasm of large intestine: Secondary | ICD-10-CM | POA: Insufficient documentation

## 2019-06-07 DIAGNOSIS — K668 Other specified disorders of peritoneum: Secondary | ICD-10-CM | POA: Diagnosis not present

## 2019-06-07 DIAGNOSIS — C786 Secondary malignant neoplasm of retroperitoneum and peritoneum: Secondary | ICD-10-CM | POA: Diagnosis not present

## 2019-06-07 DIAGNOSIS — Z79899 Other long term (current) drug therapy: Secondary | ICD-10-CM | POA: Diagnosis not present

## 2019-06-07 DIAGNOSIS — C562 Malignant neoplasm of left ovary: Secondary | ICD-10-CM

## 2019-06-07 DIAGNOSIS — I739 Peripheral vascular disease, unspecified: Secondary | ICD-10-CM | POA: Diagnosis not present

## 2019-06-07 DIAGNOSIS — D649 Anemia, unspecified: Secondary | ICD-10-CM | POA: Diagnosis not present

## 2019-06-07 DIAGNOSIS — F419 Anxiety disorder, unspecified: Secondary | ICD-10-CM | POA: Insufficient documentation

## 2019-06-07 LAB — PROTIME-INR
INR: 1 (ref 0.8–1.2)
Prothrombin Time: 13.1 seconds (ref 11.4–15.2)

## 2019-06-07 LAB — CBC
HCT: 41.4 % (ref 36.0–46.0)
Hemoglobin: 14.4 g/dL (ref 12.0–15.0)
MCH: 36.2 pg — ABNORMAL HIGH (ref 26.0–34.0)
MCHC: 34.8 g/dL (ref 30.0–36.0)
MCV: 104 fL — ABNORMAL HIGH (ref 80.0–100.0)
Platelets: 150 10*3/uL (ref 150–400)
RBC: 3.98 MIL/uL (ref 3.87–5.11)
RDW: 12.9 % (ref 11.5–15.5)
WBC: 5.9 10*3/uL (ref 4.0–10.5)
nRBC: 0 % (ref 0.0–0.2)

## 2019-06-07 LAB — APTT: aPTT: 32 seconds (ref 24–36)

## 2019-06-07 MED ORDER — LIDOCAINE HCL (PF) 1 % IJ SOLN
INTRAMUSCULAR | Status: AC | PRN
  Administered 2019-06-07: 10 mL

## 2019-06-07 MED ORDER — MIDAZOLAM HCL 2 MG/2ML IJ SOLN
INTRAMUSCULAR | Status: AC | PRN
  Administered 2019-06-07 (×2): 1 mg via INTRAVENOUS

## 2019-06-07 MED ORDER — FENTANYL CITRATE (PF) 100 MCG/2ML IJ SOLN
INTRAMUSCULAR | Status: AC
  Filled 2019-06-07: qty 2

## 2019-06-07 MED ORDER — FENTANYL CITRATE (PF) 100 MCG/2ML IJ SOLN
INTRAMUSCULAR | Status: AC | PRN
  Administered 2019-06-07 (×2): 50 ug via INTRAVENOUS

## 2019-06-07 MED ORDER — SODIUM CHLORIDE 0.9 % IV SOLN
INTRAVENOUS | Status: DC
  Administered 2019-06-07: 08:00:00 via INTRAVENOUS

## 2019-06-07 MED ORDER — HYDROCODONE-ACETAMINOPHEN 5-300 MG PO TABS: 1.0000 | ORAL_TABLET | Freq: Three times a day (TID) | ORAL | 0 refills | Status: DC | PRN

## 2019-06-07 MED ORDER — HYDROCODONE-ACETAMINOPHEN 5-325 MG PO TABS
1.0000 | ORAL_TABLET | ORAL | Status: AC
  Administered 2019-06-07: 11:00:00 1 via ORAL
  Filled 2019-06-07: qty 1

## 2019-06-07 MED ORDER — MIDAZOLAM HCL 2 MG/2ML IJ SOLN
INTRAMUSCULAR | Status: AC
  Filled 2019-06-07: qty 4

## 2019-06-07 NOTE — Telephone Encounter (Signed)
Requested rush on pathology from CT Biopsy today with Maudie Mercury in [facility].

## 2019-06-07 NOTE — Discharge Instructions (Signed)
Moderate Conscious Sedation, Adult, Care After These instructions provide you with information about caring for yourself after your procedure. Your health care provider may also give you more specific instructions. Your treatment has been planned according to current medical practices, but problems sometimes occur. Call your health care provider if you have any problems or questions after your procedure. What can I expect after the procedure? After your procedure, it is common:  To feel sleepy for several hours.  To feel clumsy and have poor balance for several hours.  To have poor judgment for several hours.  To vomit if you eat too soon. Follow these instructions at home: For at least 24 hours after the procedure:   Do not: ? Participate in activities where you could fall or become injured. ? Drive. ? Use heavy machinery. ? Drink alcohol. ? Take sleeping pills or medicines that cause drowsiness. ? Make important decisions or sign legal documents. ? Take care of children on your own.  Rest. Eating and drinking  Follow the diet recommended by your health care provider.  If you vomit: ? Drink water, juice, or soup when you can drink without vomiting. ? Make sure you have little or no nausea before eating solid foods. General instructions  Have a responsible adult stay with you until you are awake and alert.  Take over-the-counter and prescription medicines only as told by your health care provider.  If you smoke, do not smoke without supervision.  Keep all follow-up visits as told by your health care provider. This is important. Contact a health care provider if:  You keep feeling nauseous or you keep vomiting.  You feel light-headed.  You develop a rash.  You have a fever. Get help right away if:  You have trouble breathing. This information is not intended to replace advice given to you by your health care provider. Make sure you discuss any questions you have  with your health care provider. Document Released: 08/31/2013 Document Revised: 10/23/2017 Document Reviewed: 03/01/2016 Elsevier Patient Education  2020 [employer].   Needle Biopsy, Care After These instructions tell you how to care for yourself after your procedure. Your doctor may also give you more specific instructions. Call your doctor if you have any problems or questions. What can I expect after the procedure? After the procedure, it is common to have:  Soreness.  Bruising.  Mild pain. Follow these instructions at home:   Return to your normal activities as told by your doctor. Ask your doctor what activities are safe for you.  Take over-the-counter and prescription medicines only as told by your doctor.  Wash your hands with soap and water before you change your bandage (dressing). If you cannot use soap and water, use hand sanitizer.  Follow instructions from your doctor about: ? How to take care of your puncture site. May remove bandaid and bathe or shower in 24 hours post procedure. Keep site clean and dry. ? When and how to change your bandage. ? When to remove your bandage.  Check your puncture site every day for signs of infection. Watch for: ? Redness, swelling, or pain. ? Fluid or blood. ? Pus or a bad smell. ? Warmth.  Do not take baths, swim, or use a hot tub until your doctor approves. Ask your doctor if you may take showers. You may only be allowed to take sponge baths.  Keep all follow-up visits as told by your doctor. This is important. Contact a doctor if you have:  A fever.  Redness, swelling, or pain at the puncture site, and it lasts longer than a few days.  Fluid, blood, or pus coming from the puncture site.  Warmth coming from the puncture site. Get help right away if:  You have a lot of bleeding from the puncture site. Summary  After the procedure, it is common to have soreness, bruising, or mild pain at the puncture site.  Check  your puncture site every day for signs of infection, such as redness, swelling, or pain.  Get help right away if you have severe bleeding from your puncture site. This information is not intended to replace advice given to you by your health care provider. Make sure you discuss any questions you have with your health care provider. Document Released: 10/23/2008 Document Revised: 11/23/2017 Document Reviewed: 11/23/2017 Elsevier Patient Education  2020 [employer].

## 2019-06-07 NOTE — Procedures (Signed)
Pre procedural Dx: Omental nodule  Post procedural Dx: Same  Technically successful CT guided biopsy of indeterminate nodule within the right lower abdomen/pelvis.    EBL: None.   Complications: None immediate.   Ronny Bacon, MD Pager #: [phone]

## 2019-06-07 NOTE — Telephone Encounter (Signed)
Lynsey called back and confirmed appointment with Dr. Alvy Bimler on 06/09/19 at 10:15.  She also said she is having trouble getting the pain medication (hydrocodone/acetaminophen) that Dr. Burr Medico sent in to [facility].  She said [facility] had to order it and it is still not available.  She would like to instead pick it up from [facility] in [place] because they have it in stock.  Message sent to Dr. Burr Medico.

## 2019-06-07 NOTE — Telephone Encounter (Signed)
Left a message for patient with appointment to see Dr. Alvy Bimler on 06/09/19 at 10:15.  Requested a return call to confirm appointment.

## 2019-06-07 NOTE — H&P (Signed)
Referring Physician(s): Feng,Yan  Supervising Physician: Sandi Mariscal  Patient Status:  [facility] OP  Chief Complaint: "I'm having a biopsy"   Subjective: Patient familiar to IR service from uterine fibroid embolization in 2005.  She has a history of colon cancer as well as ovarian cancer in 2015, status post surgery.  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  She presents today for CT-guided biopsy of the peritoneal carcinomatosis for further evaluation.  She currently denies fever, headache, chest pain, dyspnea, cough, vomiting or abnormal bleeding.  She does have abdominal/back pain as well as intermittent nausea.  Past Medical History:  Diagnosis Date  . Anemia   . Anxiety   . Arthritis   . Asthma   . Cancer ([place]) dx'd 08/2014   colon and ovarian  . Complication of anesthesia    woke and saw long tube coming out of mouth . sat up then went back to sleep  . Depression   . Ectopic pregnancy   . Family history of adverse reaction to anesthesia    sister on dialysis- hypotension   . Family history of ovarian cancer   . Head injury, closed, with brief LOC ([place])    had a cut- as a child  . Headache   . Hepatitis    being treated for Hep C  . History of blood transfusion    2005 approximately   . Hypertension    no meds now  . PAD (peripheral artery disease) ([place]) 04/18/2015  . Pelvic cyst   . PONV (postoperative nausea and vomiting)   . PTSD (post-traumatic stress disorder)    Past Surgical History:  Procedure Laterality Date  . ABDOMINAL HYSTERECTOMY    . BREAST BIOPSY Right 04/02/2017   Procedure: INCISIONAL BIOPSY RIGHT NIPPLE;  Surgeon: Coralie Keens, MD;  Location: [place];  Service: General;  Laterality: Right;  . COLONOSCOPY    . ESOPHAGOGASTRODUODENOSCOPY N/A 09/17/2014   Procedure: ESOPHAGOGASTRODUODENOSCOPY (EGD);  Surgeon: Jeryl Columbia, MD;  Location: [facility] ENDOSCOPY;  Service: Endoscopy;  Laterality: N/A;  . FEMORAL-FEMORAL BYPASS GRAFT Bilateral 04/18/2015   Procedure:  RIGHT COMMON FEMORAL TO LEFT COMMON  FEMORAL  ARTERY BYPASS WITH  8 MM HEMASHIELD GRAFT,RIGHT COMMON SUPERFICIAL FEMORAL ARTERY ENDARTERECTOMY;  Surgeon: Elam Dutch, MD;  Location: [place];  Service: Vascular;  Laterality: Bilateral;  . ILEOSTOMY Right 09/18/2014   Procedure: ILEOCOLOSTOMY;  Surgeon: Doreen Salvage, MD;  Location: [place];  Service: General;  Laterality: Right;  . LAPAROTOMY N/A 11/01/2014   Procedure: EXPLORATORY LAPAROTOMY;  Surgeon: Doreen Salvage, MD;  Location: [place];  Service: General;  Laterality: N/A;  . LAPAROTOMY N/A 12/26/2014   Procedure:  LAPAROTOMY ;  Surgeon: Everitt Amber, MD;  Location: [facility];  Service: Gynecology;  Laterality: N/A;  . MASS EXCISION  11/01/2014   Procedure: EXCISION PELVIC MASS;  Surgeon: Doreen Salvage, MD;  Location: [place];  Service: General;;  . OOPHORECTOMY  2016  . PARTIAL COLECTOMY Right 09/18/2014   Procedure: RIGHT HEMI COLECTOMY;  Surgeon: Doreen Salvage, MD;  Location: [place];  Service: General;  Laterality: Right;  . ROBOTIC ASSISTED TOTAL HYSTERECTOMY WITH BILATERAL SALPINGO OOPHERECTOMY Right 12/26/2014   Procedure: EXPLORATORY LAPAROSCOPY  RIGHT SALPINGO OOPHORECTOMY OMENTECTOMY LYMPHADECTOMY ;  Surgeon: Everitt Amber, MD;  Location: [facility];  Service: Gynecology;  Laterality: Right;  . TUBAL LIGATION  Allergies: Other, Demerol [meperidine], Morphine and related, Oxycodone, Penicillins, Tylenol [acetaminophen], and Ibuprofen  Medications: Prior to Admission medications   Medication Sig Start Date End Date Taking? Authorizing Provider  acetaminophen (TYLENOL) 325 MG tablet Take 325 mg by mouth as needed.   Yes [provider]  docusate sodium 100 MG CAPS Take 100 mg by mouth 2 (two) times daily as needed for mild constipation. 09/22/14  Yes Baird, Megan N, PA-C  lidocaine (LIDODERM) 5 % PLACE 1 PATCH  ONTO THE SKIN EVERY DAY.REMOVE AND DISCARD PATCH WITHIN 12 HOURS OR AS DIRECTED BY PRESCRIBER 06/01/19  Yes Truitt Merle, MD  losartan (COZAAR) 50 MG tablet Take 1 tablet (50 mg total) by mouth daily. Need to establish with new provider for future refills. 03/28/19  Yes Plotnikov, Evie Lacks, MD  pregabalin (LYRICA) 200 MG capsule Take 1 capsule (200 mg total) by mouth every 8 (eight) hours. 05/13/19  Yes Marrian Salvage, FNP  HYDROcodone-Acetaminophen 5-300 MG TABS Take 1 tablet by mouth every 8 (eight) hours as needed. 06/03/19   Truitt Merle, MD  LORazepam (ATIVAN) 0.5 MG tablet Take 1 tablet (0.5 mg total) by mouth 3 (three) times daily as needed for anxiety. 11/12/18   Julianne Rice, MD  ondansetron (ZOFRAN) 4 MG tablet TAKE 1 TABLET BY MOUTH EVERY 8 HOURS AS NEEDED FOR NAUSEA OR VOMITING 01/19/19   Lance Sell, NP     Vital Signs: BP (!) 157/90 (BP Location: Right Arm)   Pulse 68   Temp 98.3 F (36.8 C) (Oral)   Resp 16   Ht 5\' 7"  (1.702 m)   Wt 165 lb (74.8 kg)   SpO2 100%   BMI 25.84 kg/m   Physical Exam awake, alert.  Chest clear to auscultation bilaterally.  Heart with regular rate and rhythm.  Abdomen soft, positive bowel sounds, mild to moderate diffuse tenderness; no lower extremity edema  Imaging: Ct Chest W Contrast  Result Date: 06/07/2019 CLINICAL DATA:  Colon and ovarian cancer. EXAM: CT CHEST WITH CONTRAST TECHNIQUE: Multidetector CT imaging of the chest was performed during intravenous contrast administration. CONTRAST:  33mL OMNIPAQUE IOHEXOL 300 MG/ML  SOLN COMPARISON:  11/12/2018 FINDINGS: Cardiovascular: The heart size is normal. No substantial pericardial effusion. Coronary artery calcification is evident. Atherosclerotic calcification is noted in the wall of the thoracic aorta. Mediastinum/Nodes: No mediastinal lymphadenopathy. There is no hilar lymphadenopathy. The esophagus has normal imaging features. There is no axillary lymphadenopathy. Lungs/Pleura:  Centrilobular emphsyema noted. 4 mm right upper lobe nodule on 53/5 is stable. There is a 4 mm nodule in the left upper lobe (80/5) also unchanged. No pleural effusion. Upper Abdomen: 11 mm short axis gastrohepatic ligament lymph node has increased from 8 mm on the prior study. Other gastrohepatic ligament lymph nodes are evident. These were better characterized on abdomen CT of 05/24/2019 Musculoskeletal: No worrisome lytic or sclerotic osseous abnormality. IMPRESSION: 1. Tiny bilateral pulmonary nodules stable since 11/12/2018. Continued close attention on follow-up recommended. 2. Incompletely visualized upper abdominal lymphadenopathy better characterized on the abdomen CT from 2 weeks ago. 3.  Aortic Atherosclerois (ICD10-170.0) 4.  Emphysema. ([id number]) Electronically Signed   By: Misty Stanley M.D.   On: 06/07/2019 08:19    Labs:  CBC: Recent Labs    11/12/18 1316 05/02/19 1226 06/07/19 0746  WBC 6.0 5.3 5.9  HGB 16.3* 15.7* 14.4  HCT 45.9 43.9 41.4  PLT 159 150 150    COAGS: Recent Labs    06/07/19 0746  INR 1.0  APTT 32    BMP: Recent Labs    11/12/18 1316 05/02/19 1226  NA 142 143  K 4.2 4.1  CL 106 107  CO2 27 25  GLUCOSE 96 91  BUN 11 13  CALCIUM 9.5 9.3  CREATININE 0.88 0.98  GFRNONAA >60 >60  GFRAA >60 >60    LIVER FUNCTION TESTS: Recent Labs    11/12/18 1316 05/02/19 1226  BILITOT 0.6 0.4  AST 19 19  ALT 11 9  ALKPHOS 68 77  PROT 8.3* 8.1  ALBUMIN 4.7 4.3    Assessment and Plan: Pt with history of colon cancer as well as ovarian cancer in 2015, status post surgery.  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  She presents today for CT-guided biopsy of the peritoneal carcinomatosis for further evaluation.  Risks and benefits of procedure was discussed with the patient  including, but not limited to bleeding, infection, damage to adjacent  structures or low yield requiring additional tests.  All of the questions were answered and there is agreement to proceed.  Consent signed and in chart.     Electronically Signed: D. Rowe Robert, PA-C 06/07/2019, 8:49 AM   I spent a total of 25 minutes at the the patient's bedside AND on the patient's hospital floor or unit, greater than 50% of which was counseling/coordinating care for CT-guided biopsy of peritoneal carcinomatosis

## 2019-06-08 ENCOUNTER — Encounter: Payer: Self-pay | Admitting: Neurology

## 2019-06-08 ENCOUNTER — Ambulatory Visit (INDEPENDENT_AMBULATORY_CARE_PROVIDER_SITE_OTHER): Payer: Medicare Other | Admitting: Neurology

## 2019-06-08 ENCOUNTER — Other Ambulatory Visit: Payer: Self-pay

## 2019-06-08 VITALS — BP 126/68 | HR 62 | Temp 97.1°F | Ht 67.0 in | Wt 174.0 lb

## 2019-06-08 DIAGNOSIS — R200 Anesthesia of skin: Secondary | ICD-10-CM

## 2019-06-08 DIAGNOSIS — M79605 Pain in left leg: Secondary | ICD-10-CM | POA: Diagnosis not present

## 2019-06-08 MED ORDER — PREGABALIN 200 MG PO CAPS: 200.0000 mg | ORAL_CAPSULE | Freq: Three times a day (TID) | ORAL | 5 refills | Status: DC

## 2019-06-08 NOTE — Progress Notes (Signed)
PATIENT: Catherine Rogers DOB: 04/19/1961  Chief Complaint  Patient presents with  . Pain    Reports pain in her bilateral arms and legs.  She also has occasional numbness.   The symptoms are worse in her left leg.  She has difficulty with walking.  She is taking generic Lyrica 200mg , one capsule TID but it is only mildly helpful.  [name] Headache    She estimates having two severe headache days per week.  She takes Tylenol or hydrocodone but tries to use them sparingly.  [name] PCP    Lance Sell, NP     HISTORICAL  Catherine Rogers is a 58 year old female, seen in request by her primary care nurse practitioner Caesar Chestnut for evaluation of left leg pain, difficulty walking, initial evaluation was on June 08, 2019.  I have reviewed and summarized the referring note from the referring physician.  She had past medical history of hypertension, I saw her previously in April 2016, she was diagnosed with right-sided colon cancer following GI bleeding in October 2015, also found to have ovarian cancer, she underwent exploratory laparotomy with excision of pelvic mass, and left ovarian in December 2015, third surgery in December 26, 2014 for exploratory lap prostatectomy with right oophorectomy, pelvic and periaortic lymph node dissection, omentumectomy, she complains of left lower extremity radiating pain since the second surgery in February 2016, previously had extensive evaluation, negative for left lower extremity DVT, MRI of pelvic without contrast in March 2016 from [place] showed 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, 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, she also had electrodiagnostic study in April 2016, which suggestive of left L4, L5-S1 lumbar plexopathy, CT of lumbar spine showed moderate to severe lower lumbar facet arthrosis, worst at L4-5, with evidence of moderate spinal canal stenosis, moderate bilateral neural foraminal stenosis  The conclusion at that time for her  radiating left lower extremity pain was likely the combination of left hip pathology, a component of left lumbar plexopathy, her pain failed to be controlled by Lyrica, Lortab, I treated her with fentanyl patch, for a while, she lost to follow-up  Patient reported that her left leg pain overall was under reasonable control until June 2020, she complains of increased lower abdomen, left lower extremity pain, repeat CT abdomen on May 24, 2015 showed new peritoneal carcinomatosis in the pelvic, and right lower quadrant, she just had a CT-guided biopsy of peritoneal nodule within the right lower abdomen pelvic  She now complains of significant left lower extremity pain from the hip down, difficulty bearing weight, low back pain, right leg is her stronger leg, she denies bowel and bladder incontinence.  REVIEW OF SYSTEMS: Full 14 system review of systems performed and notable only for as above All other review of systems were negative.  ALLERGIES: Allergies  Allergen Reactions  . Other Hives and Itching    Highly acidic foods "make me itch and break out" oranges, tomatoes  . Demerol [Meperidine] Nausea And Vomiting  . Morphine And Related Nausea And Vomiting  . Oxycodone Itching  . Penicillins Other (See Comments)    Childhood allergy  . Tylenol [Acetaminophen] Nausea And Vomiting  . Ibuprofen Nausea And Vomiting    HOME MEDICATIONS: Current Outpatient Medications  Medication Sig Dispense Refill  . acetaminophen (TYLENOL) 325 MG tablet Take 325 mg by mouth as needed.    . docusate sodium 100 MG CAPS Take 100 mg by mouth 2 (two) times daily as needed for mild constipation.  10 capsule 0  . HYDROcodone-Acetaminophen 5-300 MG TABS Take 1 tablet by mouth every 8 (eight) hours as needed. 30 tablet 0  . lidocaine (LIDODERM) 5 % PLACE 1 PATCH ONTO THE SKIN EVERY DAY.REMOVE AND DISCARD PATCH WITHIN 12 HOURS OR AS DIRECTED BY PRESCRIBER 30 patch 1  . LORazepam (ATIVAN) 0.5 MG tablet Take 1 tablet  (0.5 mg total) by mouth 3 (three) times daily as needed for anxiety. 10 tablet 0  . losartan (COZAAR) 50 MG tablet Take 1 tablet (50 mg total) by mouth daily. Need to establish with new provider for future refills. 30 tablet 1  . ondansetron (ZOFRAN) 4 MG tablet TAKE 1 TABLET BY MOUTH EVERY 8 HOURS AS NEEDED FOR NAUSEA OR VOMITING 20 tablet 0  . pregabalin (LYRICA) 200 MG capsule Take 1 capsule (200 mg total) by mouth every 8 (eight) hours. 90 capsule 0   No current facility-administered medications for this visit.     PAST MEDICAL HISTORY: Past Medical History:  Diagnosis Date  . Anemia   . Anxiety   . Arthritis   . Asthma   . Cancer ([place]) dx'd 08/2014   colon and ovarian  . Complication of anesthesia    woke and saw long tube coming out of mouth . sat up then went back to sleep  . Depression   . Ectopic pregnancy   . Family history of adverse reaction to anesthesia    sister on dialysis- hypotension   . Family history of ovarian cancer   . Head injury, closed, with brief LOC ([place])    had a cut- as a child  . Headache   . Hepatitis    being treated for Hep C  . History of blood transfusion    2005 approximately   . Hypertension    no meds now  . PAD (peripheral artery disease) ([place]) 04/18/2015  . Pelvic cyst   . PONV (postoperative nausea and vomiting)   . PTSD (post-traumatic stress disorder)     PAST SURGICAL HISTORY: Past Surgical History:  Procedure Laterality Date  . ABDOMINAL HYSTERECTOMY    . BREAST BIOPSY Right 04/02/2017   Procedure: INCISIONAL BIOPSY RIGHT NIPPLE;  Surgeon: Coralie Keens, MD;  Location: [facility];  Service: General;  Laterality: Right;  . COLONOSCOPY    . ESOPHAGOGASTRODUODENOSCOPY N/A 09/17/2014   Procedure: ESOPHAGOGASTRODUODENOSCOPY (EGD);  Surgeon: Jeryl Columbia, MD;  Location: [facility];  Service: Endoscopy;  Laterality: N/A;  . FEMORAL-FEMORAL BYPASS GRAFT Bilateral 04/18/2015   Procedure:  RIGHT COMMON FEMORAL TO  LEFT COMMON  FEMORAL  ARTERY BYPASS WITH  8 MM HEMASHIELD GRAFT,RIGHT COMMON SUPERFICIAL FEMORAL ARTERY ENDARTERECTOMY;  Surgeon: Elam Dutch, MD;  Location: [facility];  Service: Vascular;  Laterality: Bilateral;  . ILEOSTOMY Right 09/18/2014   Procedure: ILEOCOLOSTOMY;  Surgeon: Doreen Salvage, MD;  Location: [facility];  Service: General;  Laterality: Right;  . LAPAROTOMY N/A 11/01/2014   Procedure: EXPLORATORY LAPAROTOMY;  Surgeon: Doreen Salvage, MD;  Location: [facility];  Service: General;  Laterality: N/A;  . LAPAROTOMY N/A 12/26/2014   Procedure:  LAPAROTOMY ;  Surgeon: Everitt Amber, MD;  Location: [facility];  Service: Gynecology;  Laterality: N/A;  . MASS EXCISION  11/01/2014   Procedure: EXCISION PELVIC MASS;  Surgeon: Doreen Salvage, MD;  Location: [facility];  Service: General;;  . OOPHORECTOMY  2016  . PARTIAL COLECTOMY Right 09/18/2014   Procedure: RIGHT HEMI COLECTOMY;  Surgeon: Doreen Salvage, MD;  Location: [facility];  Service: General;  Laterality: Right;  . ROBOTIC ASSISTED TOTAL HYSTERECTOMY WITH BILATERAL SALPINGO OOPHERECTOMY Right 12/26/2014   Procedure: EXPLORATORY LAPAROSCOPY  RIGHT SALPINGO OOPHORECTOMY OMENTECTOMY LYMPHADECTOMY ;  Surgeon: Everitt Amber, MD;  Location: [facility];  Service: Gynecology;  Laterality: Right;  . TUBAL LIGATION      FAMILY HISTORY: Family History  Problem Relation Age of Onset  . Brain cancer Sister 45  . Lung cancer Brother 69  . Heart attack Mother   . Heart attack Father        30s  . Heart disease Father   . Heart attack Maternal Uncle   . Diabetes Maternal Uncle   . Breast cancer Maternal Uncle   . Ovarian cancer Paternal Aunt        3 paternal aunts with ovarian cancer  . Heart attack Brother   . Diabetes Paternal Uncle     SOCIAL HISTORY: Social History   Socioeconomic History  . Marital status: Widowed    Spouse name: Not on file  . Number of children: 6  . Years of education: 60  . Highest education level: Not on file  Occupational History  . Occupation:  Disability  Social Needs  . Financial resource strain: Not hard at all  . Food insecurity    Worry: Never true    Inability: Never true  . Transportation needs    Medical: No    Non-medical: No  Tobacco Use  . Smoking status: Current Some Day Smoker    Years: 10.00    Types: Cigars  . Smokeless tobacco: Never Used  . Tobacco comment: pack per month of cigars = 5  Substance and Sexual Activity  . Alcohol use: No  . Drug use: Not Currently    Frequency: 5.0 times per week    Types: Marijuana    Comment: last smoked 09/22/17  . Sexual activity: Never  Lifestyle  . Physical activity    Days per week: 5 days    Minutes per session: 50 min  . Stress: To some extent  Relationships  . Social connections    Talks on phone: More than three times a week    Gets together: More than three times a week    Attends religious service: More than 4 times per year    Active member of club or organization: Yes    Attends meetings of clubs or organizations: More than 4 times per year    Relationship status: Widowed  . Intimate partner violence    Fear of current or ex partner: Not on file    Emotionally abused: Not on file    Physically abused: Not on file    Forced sexual activity: Not on file  Other Topics Concern  . Not on file  Social History Narrative   Born and raised in [place], [place]. Currently resides in a house by herself. No pets. Fun: Read, shoot pool.    Denies religious beliefs that would effect health care.    Lives at home with her son.   Right-handed.   No caffeine use.   Denies abuse and feels safe at home.      PHYSICAL EXAM   Vitals:   06/08/19 1537  BP: 126/68  Pulse: 62  Temp: (!) 97.1 F (36.2 C)  Weight: 174 lb (78.9 kg)  Height: 5\' 7"  (1.702 m)    Not recorded      Body mass index is 27.25 kg/m.  PHYSICAL EXAMNIATION:  Gen: NAD, conversant, well nourised, obese,  well groomed                     Cardiovascular: Regular rate rhythm, no  peripheral edema, warm, nontender. Eyes: Conjunctivae clear without exudates or hemorrhage Neck: Supple, no carotid bruits. Pulmonary: Clear to auscultation bilaterally   NEUROLOGICAL EXAM:  MENTAL STATUS: Speech:    Speech is normal; fluent and spontaneous with normal comprehension.  Cognition:     Orientation to time, place and person     Normal recent and remote memory     Normal Attention span and concentration     Normal Language, naming, repeating,spontaneous speech     Fund of knowledge   CRANIAL NERVES: CN II: Visual fields are full to confrontation. Pupils are round equal and briskly reactive to light. CN III, IV, VI: extraocular movement are normal. No ptosis. CN V: Facial sensation is intact to pinprick in all 3 divisions bilaterally. Corneal responses are intact.  CN VII: Face is symmetric with normal eye closure and smile. CN VIII: Hearing is normal to rubbing fingers CN IX, X: Palate elevates symmetrically. Phonation is normal. CN XI: Head turning and shoulder shrug are intact CN XII: Tongue is midline with normal movements and no atrophy.  MOTOR: Left lower extremity motor strength examination is limited because left hip and lower extremity pain, there was no significant weakness  REFLEXES: Reflexes are 2+ and symmetric at the biceps, triceps, knees, and ankles. Plantar responses are flexor.  SENSORY: Intact to light touch, pinprick, positional sensation and vibratory sensation are intact in fingers and toes.  COORDINATION: Rapid alternating movements and fine finger movements are intact. There is no dysmetria on finger-to-nose and heel-knee-shin.    GAIT/STANCE: She needs push-up to get up from seated position, antalgic, dragging left leg   DIAGNOSTIC DATA (LABS, IMAGING, TESTING) - I reviewed patient records, labs, notes, testing and imaging myself where available.   ASSESSMENT AND PLAN  Catherine Rogers is a 58 y.o. female   Bilateral femur heads  avascular necrosis, 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 Worsening left lower extremity pain, weakness, paresthesia, low back pain, Recurrent peritoneal carcinomatosis  EMG nerve conduction study to rule out left lower extremity neuropathy, left plexopathy, left lumbar radiculopathy  Keep Lyrica to 200 mg 3 times a day, patient stated brand-name Lyrica works much better, she is also receiving hydrocodone from her oncologist  MRI of lumbar spine    Marcial Pacas, M.D. Ph.D.  [facility] [address] [place], [place] [address] Ph: [phone] Fax: [phone]  CC: Lance Sell, NP

## 2019-06-09 ENCOUNTER — Telehealth: Payer: Self-pay | Admitting: Neurology

## 2019-06-09 ENCOUNTER — Inpatient Hospital Stay: Payer: Medicare Other | Attending: Hematology | Admitting: Hematology and Oncology

## 2019-06-09 ENCOUNTER — Telehealth: Payer: Self-pay | Admitting: Oncology

## 2019-06-09 ENCOUNTER — Encounter: Payer: Self-pay | Admitting: Hematology and Oncology

## 2019-06-09 ENCOUNTER — Other Ambulatory Visit: Payer: Self-pay | Admitting: Hematology and Oncology

## 2019-06-09 ENCOUNTER — Other Ambulatory Visit: Payer: Self-pay

## 2019-06-09 VITALS — BP 130/54 | HR 70 | Temp 98.3°F | Resp 18 | Ht 67.0 in

## 2019-06-09 DIAGNOSIS — J439 Emphysema, unspecified: Secondary | ICD-10-CM | POA: Diagnosis not present

## 2019-06-09 DIAGNOSIS — D261 Other benign neoplasm of corpus uteri: Secondary | ICD-10-CM | POA: Diagnosis not present

## 2019-06-09 DIAGNOSIS — I10 Essential (primary) hypertension: Secondary | ICD-10-CM | POA: Diagnosis not present

## 2019-06-09 DIAGNOSIS — G893 Neoplasm related pain (acute) (chronic): Secondary | ICD-10-CM | POA: Insufficient documentation

## 2019-06-09 DIAGNOSIS — Z808 Family history of malignant neoplasm of other organs or systems: Secondary | ICD-10-CM | POA: Insufficient documentation

## 2019-06-09 DIAGNOSIS — Z880 Allergy status to penicillin: Secondary | ICD-10-CM | POA: Diagnosis not present

## 2019-06-09 DIAGNOSIS — R918 Other nonspecific abnormal finding of lung field: Secondary | ICD-10-CM | POA: Diagnosis not present

## 2019-06-09 DIAGNOSIS — Z79899 Other long term (current) drug therapy: Secondary | ICD-10-CM

## 2019-06-09 DIAGNOSIS — C562 Malignant neoplasm of left ovary: Secondary | ICD-10-CM | POA: Insufficient documentation

## 2019-06-09 DIAGNOSIS — I739 Peripheral vascular disease, unspecified: Secondary | ICD-10-CM | POA: Insufficient documentation

## 2019-06-09 DIAGNOSIS — Z803 Family history of malignant neoplasm of breast: Secondary | ICD-10-CM | POA: Insufficient documentation

## 2019-06-09 DIAGNOSIS — F17290 Nicotine dependence, other tobacco product, uncomplicated: Secondary | ICD-10-CM | POA: Diagnosis not present

## 2019-06-09 DIAGNOSIS — I700 Atherosclerosis of aorta: Secondary | ICD-10-CM | POA: Insufficient documentation

## 2019-06-09 DIAGNOSIS — C182 Malignant neoplasm of ascending colon: Secondary | ICD-10-CM | POA: Diagnosis present

## 2019-06-09 DIAGNOSIS — G609 Hereditary and idiopathic neuropathy, unspecified: Secondary | ICD-10-CM | POA: Insufficient documentation

## 2019-06-09 DIAGNOSIS — Z9049 Acquired absence of other specified parts of digestive tract: Secondary | ICD-10-CM | POA: Diagnosis not present

## 2019-06-09 DIAGNOSIS — Z90710 Acquired absence of both cervix and uterus: Secondary | ICD-10-CM | POA: Diagnosis not present

## 2019-06-09 DIAGNOSIS — F1290 Cannabis use, unspecified, uncomplicated: Secondary | ICD-10-CM | POA: Insufficient documentation

## 2019-06-09 DIAGNOSIS — R590 Localized enlarged lymph nodes: Secondary | ICD-10-CM | POA: Insufficient documentation

## 2019-06-09 DIAGNOSIS — Z1501 Genetic susceptibility to malignant neoplasm of breast: Secondary | ICD-10-CM

## 2019-06-09 DIAGNOSIS — Z885 Allergy status to narcotic agent status: Secondary | ICD-10-CM | POA: Diagnosis not present

## 2019-06-09 DIAGNOSIS — Z801 Family history of malignant neoplasm of trachea, bronchus and lung: Secondary | ICD-10-CM | POA: Insufficient documentation

## 2019-06-09 DIAGNOSIS — C786 Secondary malignant neoplasm of retroperitoneum and peritoneum: Secondary | ICD-10-CM | POA: Diagnosis not present

## 2019-06-09 DIAGNOSIS — Z886 Allergy status to analgesic agent status: Secondary | ICD-10-CM | POA: Diagnosis not present

## 2019-06-09 DIAGNOSIS — Z8041 Family history of malignant neoplasm of ovary: Secondary | ICD-10-CM | POA: Insufficient documentation

## 2019-06-09 DIAGNOSIS — Z1509 Genetic susceptibility to other malignant neoplasm: Secondary | ICD-10-CM

## 2019-06-09 DIAGNOSIS — Z5111 Encounter for antineoplastic chemotherapy: Secondary | ICD-10-CM | POA: Insufficient documentation

## 2019-06-09 DIAGNOSIS — Z8249 Family history of ischemic heart disease and other diseases of the circulatory system: Secondary | ICD-10-CM | POA: Insufficient documentation

## 2019-06-09 DIAGNOSIS — Z7189 Other specified counseling: Secondary | ICD-10-CM | POA: Insufficient documentation

## 2019-06-09 DIAGNOSIS — Z833 Family history of diabetes mellitus: Secondary | ICD-10-CM | POA: Insufficient documentation

## 2019-06-09 MED ORDER — DEXAMETHASONE 4 MG PO TABS: ORAL_TABLET | ORAL | 9 refills | Status: DC

## 2019-06-09 MED ORDER — ONDANSETRON HCL 8 MG PO TABS: 8.0000 mg | ORAL_TABLET | Freq: Three times a day (TID) | ORAL | 1 refills | Status: DC | PRN

## 2019-06-09 MED ORDER — PROCHLORPERAZINE MALEATE 10 MG PO TABS: 10.0000 mg | ORAL_TABLET | Freq: Four times a day (QID) | ORAL | 1 refills | Status: DC | PRN

## 2019-06-09 MED ORDER — HYDROCODONE-ACETAMINOPHEN 5-325 MG PO TABS: 1.0000 | ORAL_TABLET | Freq: Four times a day (QID) | ORAL | 0 refills | Status: DC | PRN

## 2019-06-09 MED ORDER — METHADONE HCL 10 MG PO TABS: 10.0000 mg | ORAL_TABLET | Freq: Two times a day (BID) | ORAL | 0 refills | Status: DC

## 2019-06-09 MED FILL — HYDROCODON-APAP 5-325: 5-325 | 10 days supply | Qty: 40 | Fill #0

## 2019-06-09 MED FILL — PROCHLORPERAZINE 10 MG TAB: 10 | 8 days supply | Qty: 30 | Fill #0

## 2019-06-09 MED FILL — ONDANSETRON HCL 8 MG TABLET: 8 | 10 days supply | Qty: 30 | Fill #0

## 2019-06-09 MED FILL — DEXAMETHASONE 4 MG TABLET: 4 | 84 days supply | Qty: 24 | Fill #0

## 2019-06-09 NOTE — Telephone Encounter (Signed)
Medicare/medicaid order sent to GI. No auth they will reach out to the patient to schedule.

## 2019-06-09 NOTE — Assessment & Plan Note (Signed)
According to her, she has 6 children 1 of her daughter who is 53 tested positive We discussed the importance of early screening mammogram and consideration for prophylactic mastectomy and oophorectomy for her daughter

## 2019-06-09 NOTE — Assessment & Plan Note (Signed)
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

## 2019-06-09 NOTE — Assessment & Plan Note (Signed)
She has severe, uncontrolled cancer pain She was prescribed hydrocodone with minimal benefit I have reviewed her medication allergy I recommend trial of methadone I warned her about risk of constipation I will see her next week for review of pain management I also refill her prescription hydrocodone for breakthrough pain

## 2019-06-09 NOTE — Progress Notes (Signed)
[place] progress notes  Patient Care Team: Lance Sell, NP as PCP - General (Nurse Practitioner) Truitt Merle, MD as Consulting Physician (Hematology) Judeth Horn, MD as Consulting Physician (General Surgery) Nat Math, MD as Referring Physician (Obstetrics and Gynecology) Elam Dutch, MD as Consulting Physician (Vascular Surgery) [name], MD as Attending Physician (Gynecologic Oncology)  CHIEF COMPLAINTS/PURPOSE OF VISIT:  Recurrent ovarian cancer  HISTORY OF PRESENTING ILLNESS:  [name] 58 y.o. female was transferred to my care per request by her oncologist I reviewed the patient's records extensive and collaborated the history with the patient. Summary of her history is as follows: Oncology History Overview Note  BRCA1 positive   Cancer of right colon ([place])  09/16/2014 Imaging   CT abd/pel:  a 3.8cm cecal mass with associated ileocecal intussusception, and a 9.1cm solid and cystic mass in the left pelvis,   09/18/2014 Initial Diagnosis   Adenocarcinoma of right colon   09/18/2014 Pathologic Stage   pT3pN1Mx, tumor extends into pericolonic soft tissue and is less than 63m from the serosal surface, LVI 8-), PNI (-), all 23 node negative, one soft tissue tumor deposit, surgical margins negative.    09/18/2014 Surgery   right hemicolectomy and terminal ileoectomy   08/06/2016 Imaging   CT chest/abd/pelvis with contrast IMPRESSION: 1. No recurrent malignancy identified. 2. 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 On the prior exam from 09/13/2015 there was back flow of contrast in the left external iliac artery to supply the left internal iliac artery ; this back flow of contrast is no longer present in the left external iliac artery is occluded. 3. Several pulmonary nodules at or below 4 mm diameter, unchanged. 4. 3 mm hypodensity  posteriorly in the body of the pancreas, not well seen previously and subtle today, likely incidental, but merits observation. 5. 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 Prominent degenerative spurring of both acetabula. 6. Lumbar spondylosis and degenerative disc disease causing prominent impingement at L4-5 and mild impingement at L5-S1.   03/08/2017 Imaging   CT CAP W Contrast 03/08/17 IMPRESSION: Chest Impression: 1. Stable small pulmonary nodules within LEFT lung. 2. No mediastinal lymphadenopathy. Abdomen / Pelvis Impression: 1. No evidence of metastatic colorectal cancer or ovarian cancer within the abdomen pelvis. 2. No lymphadenopathy. 3. Postsurgical change consistent RIGHT hemicolectomy and hysterectomy 4. A fem-fem arterial bypass noted.   11/13/2017 Imaging   CT AP w contrast IMPRESSION: 1. No findings of recurrent malignancy in the abdomen or pelvis. 2. Other imaging findings of potential clinical significance: Chronic AVN of both femoral heads without contour abnormality. Lumbar impingement at L4-5 and L5-S1. Aortic Atherosclerosis (ICD10-I70.0). 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 Partial right hemicolectomy. Hysterectomy.   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 ([place]  10/09/2014 Imaging   CT of the abdomen and pelvis showed a 9.1 cm solid and cystic mass in the left pelvis concerning for malignancy. This was discovered during her workup for colon cancer.   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 Tumor size 3.7 cm, no lymph nodes removed. T1cNx. Tumor cells are positive for cytokeratin 7 and estrogen receptor.  11/28/2014 Cancer Staging   Staging form:  Ovary, AJCC 7th Edition - Clinical: Stage III (rT3, N0, M0) - Signed by Heath Lark, MD on 06/09/2019   03/08/2017 Imaging   CT CAP W Contrast 03/08/17 IMPRESSION: Chest Impression: 1. Stable small pulmonary nodules within LEFT lung. 2. No mediastinal lymphadenopathy. Abdomen / Pelvis Impression: 1. No evidence of metastatic colorectal cancer or ovarian cancer within the abdomen pelvis. 2. No lymphadenopathy. 3. Postsurgical change consistent RIGHT hemicolectomy and hysterectomy 4. A fem-fem arterial bypass noted.   03/12/2017 Mammogram   MM Right Breast 03/12/17 IMPRESSION: No mammographic or sonographic evidence of malignancy. No correlate identified for the abnormal enhancement seen within the right nipple on MRI of 02/27/2017.    04/02/2017 Pathology Results   Surgical Pathology of Nipple Biopsy: 04/02/17 Diagnosis Nipple Biopsy, Right - BENIGN NIPPLE TISSUE. - NO ATYPIA OR MALIGNANCY.    11/13/2017 Imaging   1. No findings of recurrent malignancy in the abdomen or pelvis. 2. Other imaging findings of potential clinical significance: Chronic AVN of both femoral heads without contour abnormality. Lumbar impingement at L4-5 and L5-S1. Aortic Atherosclerosis (ICD10-I70.0). 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 Partial right hemicolectomy. Hysterectomy.   03/22/2018 Mammogram   Screening mammogram IMPRESSION No evidence of malignancy.     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   06/06/2019 Imaging   1. Tiny bilateral pulmonary nodules stable since 11/12/2018. Continued close attention on follow-up recommended. 2. Incompletely visualized upper abdominal lymphadenopathy better  characterized on the abdomen CT from 2 weeks ago. 3.  Aortic Atherosclerois (ICD10-170.0) 4.  Emphysema. ([id number]-Q00.9)   06/07/2019 Pathology Results   Soft Tissue Needle Core Biopsy, peritoneal nodule within right lower abdomen - METASTATIC CARCINOMA CONSISTENT WITH PATIENT'S CLINICAL HISTORY OF PRIMARY OVARIAN CARCINOMA. SEE NOTE Diagnosis Note Immunohistochemical stains show that the tumor cells are positive for CK7, ER and PAX 8; and negative for CK20 and CDX2. This immunostaining profile is consistent with above diagnosis.   06/07/2019 Procedure   Technically successful CT guided core needle biopsy of indeterminate peritoneal nodule within the right lower abdomen/pelvis.    The patient did not undergo adjuvant chemotherapy in the past due to fear of cancer recurrence She follows closely with her oncologist with tumor marker monitoring and periodic surveillance imaging Approximately 4 months ago, she started to complain of abdominal nausea, reduced appetite, approximately 10 pound weight loss and intermittent abdominal pain in the right lower quadrant in her lower back With worsening pain, she was prescribed hydrocodone with minimum pain relief She has pre-existing chronic left leg pain, neuropathic in description radiating down to her left foot and toes since surgery 5 years ago She has significant anxiety in the background.  She was crying nonstop since I entered the room She denies recent vomiting Denies recent constipation No recent fever or chills She tolerated recent biopsy well  MEDICAL HISTORY:  Past Medical History:  Diagnosis Date  . Anemia   . Anxiety   . Arthritis   . Asthma   . Cancer ([place]) dx'd 08/2014   colon and ovarian  . Complication of anesthesia    woke and saw long tube coming out of mouth . sat up then went back to sleep  .  Depression   . Ectopic pregnancy   . Family history of adverse reaction to anesthesia    sister on dialysis- hypotension   .  Family history of ovarian cancer   . Head injury, closed, with brief LOC ([place])    had a cut- as a child  . Headache   . Hepatitis    being treated for Hep C  . History of blood transfusion    2005 approximately   . Hypertension    no meds now  . PAD (peripheral artery disease) ([place]) 04/18/2015  . Pelvic cyst   . PONV (postoperative nausea and vomiting)   . PTSD (post-traumatic stress disorder)     SURGICAL HISTORY: Past Surgical History:  Procedure Laterality Date  . ABDOMINAL HYSTERECTOMY    . BREAST BIOPSY Right 04/02/2017   Procedure: INCISIONAL BIOPSY RIGHT NIPPLE;  Surgeon: Coralie Keens, MD;  Location: [facility];  Service: General;  Laterality: Right;  . COLONOSCOPY    . ESOPHAGOGASTRODUODENOSCOPY N/A 09/17/2014   Procedure: ESOPHAGOGASTRODUODENOSCOPY (EGD);  Surgeon: Jeryl Columbia, MD;  [facility] END[facility]COPY;  Service: Endoscopy;  Laterality: N/A;  . FEMORAL-FEMORAL BYPASS GRAFT Bilateral 04/18/2015   Procedure:  RIGHT COMMON FEMORAL TO LEFT COMMON  FEMORAL  ARTERY BYPASS WITH  8 MM HEMASHIELD GRAFT,RIGHT COMMON SUPERFICIAL FEMORAL ARTERY ENDARTERECTOMY;  Surgeon: Elam Dutch, MD;  Location: [facility];  Service: Vascular;  Laterality: Bilateral;  . ILEOSTOMY Right 09/18/2014   Procedure: ILEOCOLOSTOMY;  Surgeon: Doreen Salvage, MD;  Location: [place];  Service: General;  Laterality: Right;  . LAPAROTOMY N/A 11/01/2014   Procedure: EXPLORATORY LAPAROTOMY;  Surgeon: Doreen Salvage, MD;  Location: [facility];  Service: General;  Laterality: N/A;  . LAPAROTOMY N/A 12/26/2014   Procedure:  LAPAROTOMY ;  Surgeon: Everitt Amber, MD;  Location: [facility];  Service: Gynecology;  Laterality: N/A;  . MASS EXCISION  11/01/2014   Procedure: EXCISION PELVIC MASS;  Surgeon: Doreen Salvage, MD;  Location: [facility];  Service: General;;  . OOPHORECTOMY  2016  . PARTIAL COLECTOMY Right 09/18/2014   Procedure: RIGHT HEMI COLECTOMY;  Surgeon: Doreen Salvage, MD;  Location: [facility];  Service: General;  Laterality:  Right;  . ROBOTIC ASSISTED TOTAL HYSTERECTOMY WITH BILATERAL SALPINGO OOPHERECTOMY Right 12/26/2014   Procedure: EXPLORATORY LAPAROSCOPY  RIGHT SALPINGO OOPHORECTOMY OMENTECTOMY LYMPHADECTOMY ;  Surgeon: Everitt Amber, MD;  Location: [facility];  Service: Gynecology;  Laterality: Right;  . TUBAL LIGATION      SOCIAL HISTORY: Social History   Socioeconomic History  . Marital status: Widowed    Spouse name: Not on file  . Number of children: 6  . Years of education: 81  . Highest education level: Not on file  Occupational History  . Occupation: Disability  Social Needs  . Financial resource strain: Not hard at all  . Food insecurity    Worry: Never true    Inability: Never true  . Transportation needs    Medical: No    Non-medical: No  Tobacco Use  . Smoking status: Current Some Day Smoker    Years: 10.00    Types: Cigars  . Smokeless tobacco: Never Used  . Tobacco comment: pack per month of cigars = 5  Substance and Sexual Activity  . Alcohol use: No  . Drug use: Not Currently    Frequency: 5.0 times per week    Types: Marijuana    Comment: last smoked 09/22/17  . Sexual activity: Never  Lifestyle  . Physical activity  Days per week: 5 days    Minutes per session: 50 min  . Stress: To some extent  Relationships  . Social connections    Talks on phone: More than three times a week    Gets together: More than three times a week    Attends religious service: More than 4 times per year    Active member of club or organization: Yes    Attends meetings of clubs or organizations: More than 4 times per year    Relationship status: Widowed  . Intimate partner violence    Fear of current or ex partner: Not on file    Emotionally abused: Not on file    Physically abused: Not on file    Forced sexual activity: Not on file  Other Topics Concern  . Not on file  Social History Narrative   Born and raised in [place], [place]. Currently resides in a house by herself. No pets. Fun:  Read, shoot pool.    Denies religious beliefs that would effect health care.    Lives at home with her son.   Right-handed.   No caffeine use.   Denies abuse and feels safe at home.     FAMILY HISTORY: Family History  Problem Relation Age of Onset  . Brain cancer Sister 56  . Lung cancer Brother 68  . Heart attack Mother   . Heart attack Father        93s  . Heart disease Father   . Heart attack Maternal Uncle   . Diabetes Maternal Uncle   . Breast cancer Maternal Uncle   . Ovarian cancer Paternal Aunt        3 paternal aunts with ovarian cancer  . Heart attack Brother   . Diabetes Paternal Uncle     ALLERGIES:  is allergic to other; demerol [meperidine]; morphine and related; oxycodone; penicillins; tylenol [acetaminophen]; and ibuprofen.  MEDICATIONS:  Current Outpatient Medications  Medication Sig Dispense Refill  . acetaminophen (TYLENOL) 325 MG tablet Take 325 mg by mouth as needed.    [name] dexamethasone (DECADRON) 4 MG tablet Take 3 tabs at the night before and 3 tab the morning of chemotherapy, every 3 weeks, by mouth for 6 cycles 36 tablet 9  . docusate sodium 100 MG CAPS Take 100 mg by mouth 2 (two) times daily as needed for mild constipation. 10 capsule 0  . HYDROcodone-acetaminophen (NORCO/VICODIN) 5-325 MG tablet Take 1 tablet by mouth every 6 (six) hours as needed for moderate pain. 40 tablet 0  . lidocaine (LIDODERM) 5 % PLACE 1 PATCH ONTO THE SKIN EVERY DAY.REMOVE AND DISCARD PATCH WITHIN 12 HOURS OR AS DIRECTED BY PRESCRIBER 30 patch 1  . LORazepam (ATIVAN) 0.5 MG tablet Take 1 tablet (0.5 mg total) by mouth 3 (three) times daily as needed for anxiety. 10 tablet 0  . losartan (COZAAR) 50 MG tablet Take 1 tablet (50 mg total) by mouth daily. Need to establish with new provider for future refills. 30 tablet 1  . methadone (DOLOPHINE) 10 MG tablet Take 1 tablet (10 mg total) by mouth every 12 (twelve) hours. 60 tablet 0  . ondansetron (ZOFRAN) 8 MG tablet Take 1  tablet (8 mg total) by mouth every 8 (eight) hours as needed for nausea or vomiting. 30 tablet 1  . pregabalin (LYRICA) 200 MG capsule Take 1 capsule (200 mg total) by mouth every 8 (eight) hours. Please give patient brand name Lyrica 90 capsule 5  . prochlorperazine (COMPAZINE)  10 MG tablet Take 1 tablet (10 mg total) by mouth every 6 (six) hours as needed (Nausea or vomiting). 30 tablet 1   No current facility-administered medications for this visit.     REVIEW OF SYSTEMS:   Constitutional: Denies fevers, chills or abnormal night sweats Eyes: Denies blurriness of vision, double vision or watery eyes Ears, nose, mouth, throat, and face: Denies mucositis or sore throat Respiratory: Denies cough, dyspnea or wheezes Cardiovascular: Denies palpitation, chest discomfort or lower extremity swelling Skin: Denies abnormal skin rashes Lymphatics: Denies new lymphadenopathy or easy bruising Behavioral/Psych: Mood is stable, no new changes  All other systems were reviewed with the patient and are negative.  PHYSICAL EXAMINATION: ECOG PERFORMANCE STATUS: 1 - Symptomatic but completely ambulatory  Vitals:   06/09/19 1015  BP: (!) 130/54  Pulse: 70  Resp: 18  Temp: 98.3 F (36.8 C)  SpO2: 100%   There were no vitals filed for this visit.  GENERAL:alert, crying nonstop, appears to be in distress from anxiety SKIN: skin color, texture, turgor are normal, no rashes or significant lesions EYES: normal, conjunctiva are pink and non-injected, sclera clear OROPHARYNX:no exudate, normal lips, buccal mucosa, and tongue  NECK: supple, thyroid normal size, non-tender, without nodularity LYMPH:  no palpable lymphadenopathy in the cervical, axillary or inguinal LUNGS: clear to auscultation and percussion with normal breathing effort HEART: regular rate & rhythm and no murmurs without lower extremity edema ABDOMEN:abdomen soft, limited exam due to pain.  No rebound or guarding Musculoskeletal:no  cyanosis of digits and no clubbing  PSYCH: alert & oriented x 3 with fluent speech NEURO: no focal motor/sensory deficits  LABORATORY DATA:  I have reviewed the data as listed Lab Results  Component Value Date   WBC 5.9 06/07/2019   HGB 14.4 06/07/2019   HCT 41.4 06/07/2019   MCV 104.0 (H) 06/07/2019   PLT 150 06/07/2019   Recent Labs    11/12/18 1316 05/02/19 1226  NA 142 143  K 4.2 4.1  CL 106 107  CO2 27 25  GLUCOSE 96 91  BUN 11 13  CREATININE 0.88 0.98  CALCIUM 9.5 9.3  GFRNONAA >60 >60  GFRAA >60 >60  PROT 8.3* 8.1  ALBUMIN 4.7 4.3  AST 19 19  ALT 11 9  ALKPHOS 68 77  BILITOT 0.6 0.4    RADIOGRAPHIC STUDIES: I have personally reviewed the radiological images as listed and agreed with the findings in the report. Ct Chest W Contrast  Result Date: 06/07/2019 CLINICAL DATA:  Colon and ovarian cancer. EXAM: CT CHEST WITH CONTRAST TECHNIQUE: Multidetector CT imaging of the chest was performed during intravenous contrast administration. CONTRAST:  24m OMNIPAQUE IOHEXOL 300 MG/ML  SOLN COMPARISON:  11/12/2018 FINDINGS: Cardiovascular: The heart size is normal. No substantial pericardial effusion. Coronary artery calcification is evident. Atherosclerotic calcification is noted in the wall of the thoracic aorta. Mediastinum/Nodes: No mediastinal lymphadenopathy. There is no hilar lymphadenopathy. The esophagus has normal imaging features. There is no axillary lymphadenopathy. Lungs/Pleura: Centrilobular emphsyema noted. 4 mm right upper lobe nodule on 53/5 is stable. There is a 4 mm nodule in the left upper lobe (80/5) also unchanged. No pleural effusion. Upper Abdomen: 11 mm short axis gastrohepatic ligament lymph node has increased from 8 mm on the prior study. Other gastrohepatic ligament lymph nodes are evident. These were better characterized on abdomen CT of 05/24/2019 Musculoskeletal: No worrisome lytic or sclerotic osseous abnormality. IMPRESSION: 1. Tiny bilateral  pulmonary nodules stable since 11/12/2018. Continued close attention on  follow-up recommended. 2. Incompletely visualized upper abdominal lymphadenopathy better characterized on the abdomen CT from 2 weeks ago. 3.  Aortic Atherosclerois (ICD10-170.0) 4.  Emphysema. ([id number]) Electronically Signed   By: Misty Stanley M.D.   On: 06/07/2019 08:19   Ct Abdomen Pelvis W Contrast  Result Date: 05/24/2019 CLINICAL DATA:  Follow-up left ovarian carcinoma. Elevated CA 125 level. Previous right hemicolectomy for colon carcinoma. EXAM: CT ABDOMEN AND PELVIS WITH CONTRAST TECHNIQUE: Multidetector CT imaging of the abdomen and pelvis was performed using the standard protocol following bolus administration of intravenous contrast. CONTRAST:  157m OMNIPAQUE IOHEXOL 300 MG/ML  SOLN COMPARISON:  11/13/2017 FINDINGS: Lower Chest: No acute findings. Hepatobiliary: No hepatic masses identified. Gallbladder is unremarkable. Pancreas:  No mass or inflammatory changes. Spleen: Within normal limits in size and appearance. Adrenals/Urinary Tract: No masses identified. No evidence of hydronephrosis. Unremarkable unopacified urinary bladder. Stomach/Bowel: No evidence of obstruction, inflammatory process or abnormal fluid collections. Vascular/Lymphatic: No pathologically enlarged lymph nodes. No abdominal aortic aneurysm. Aortic atherosclerosis. 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 With patent fem-fem bypass graft. Reproductive: Prior hysterectomy. New soft tissue density is seen in the hysterectomy bed and sigmoid mesocolon. New peritoneal soft tissue density is also seen in the right lower quadrant adjacent to the right: Which measures 2.2 cm. These findings are consistent with peritoneal carcinomatosis. No evidence of ascites. Other:  None. Musculoskeletal: No suspicious bone lesions identified. 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 IMPRESSION: New  peritoneal carcinomatosis in the pelvis and right lower quadrant. Electronically Signed   By: JMarlaine HindM.D.   On: 05/24/2019 15:44   Ct Biopsy  Result Date: 06/07/2019 INDICATION: History of colon and ovarian cancer, now with peritoneal nodule within the right lower abdomen/pelvis. Please perform CT-guided biopsy for tissue diagnostic purposes. EXAM: CT-GUIDED RIGHT LOWER QUADRANT/PELVIC PERITONEAL NODULE BIOPSY COMPARISON:  CT abdomen and pelvis-05/24/2019 MEDICATIONS: None. ANESTHESIA/SEDATION: Fentanyl 100 mcg IV; Versed 2 mg IV Sedation time: 17 minutes; The patient was continuously monitored during the procedure by the interventional radiology nurse under my direct supervision. CONTRAST:  None. COMPLICATIONS: None immediate. PROCEDURE: Informed consent was obtained from the patient following an explanation of the procedure, risks, benefits and alternatives. A time out was performed prior to the initiation of the procedure. The patient was positioned supine on the CT table and a limited CT was performed for procedural planning demonstrating unchanged size and appearance of the peritoneal nodule located right lower abdomen/pelvis measuring approximately 2.5 x 1.8 cm (image 33, series 2). The procedure was planned. The operative site was prepped and draped in the usual sterile fashion. Appropriate trajectory was confirmed with a 22 gauge spinal needle after the adjacent tissues were anesthetized with 1% Lidocaine with epinephrine. Appropriate positioning was confirmed and 5 core needle biopsy samples were obtained with an 18 gauge core needle biopsy device. The co-axial needle was removed following the administration of a Gel-Foam slurry and superficial hemostasis was achieved with manual compression. A limited postprocedural CT was negative for hemorrhage or additional complication. A dressing was placed. The patient tolerated the procedure well without immediate postprocedural complication. IMPRESSION:  Technically successful CT guided core needle biopsy of indeterminate peritoneal nodule within the right lower abdomen/pelvis. Electronically Signed   By: JSandi MariscalM.D.   On: 06/07/2019 14:25   Mm 3d Screen Breast Bilateral  Result Date: 05/26/2019 CLINICAL DATA:  Screening. EXAM: DIGITAL SCREENING BILATERAL MAMMOGRAM WITH TOMO AND CAD COMPARISON:  Previous exam(s). ACR Breast Density Category a: The breast tissue is almost entirely fatty. FINDINGS: There are no findings suspicious for malignancy. Images were processed with CAD. IMPRESSION: No mammographic evidence of malignancy. A result letter of this screening mammogram will be mailed directly to the patient. RECOMMENDATION: Screening mammogram in one year. (Code:SM-B-01Y) BI-RADS CATEGORY  1: Negative. Electronically Signed   By: Franki Cabot M.D.   On: 05/26/2019 10:16    ASSESSMENT & PLAN:  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 [facility]) I have a long discussion with the patient The patient was crying nonstop in my office worrying about dying from her disease I explained to the patient why surgery or radiation is not indicated for recurrence of ovarian cancer With treatments, complete remission rate is high especially for BRCA mutation carriers The patient does not want port placement for treatment We discussed repeat blood work, chemo education class and appointment next week for chemotherapy consent and assessment of pain control  We reviewed the NCCN guidelines We discussed the role of chemotherapy. The intent is of curative intent.  We discussed some of the risks, benefits, side-effects of carboplatin & Taxol. Treatment is intravenous, every 3 weeks x 6 cycles  Some of the short term side-effects included, though not limited to, including weight loss, life threatening infections, risk of allergic reactions, need for transfusions of blood products, nausea, vomiting, change in bowel habits, loss of hair, admission to hospital for various reasons, and  risks of death.   Long term side-effects are also discussed including risks of infertility, permanent damage to nerve function, hearing loss, chronic fatigue, kidney damage with possibility needing hemodialysis, and rare secondary malignancy including bone marrow disorders.  The patient is aware that the response rates discussed earlier is not guaranteed.  After a long discussion, patient made an informed decision to proceed with the prescribed plan of care.   Patient education material was dispensed. We discussed premedication with dexamethasone before chemotherapy. 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 According to her, she has 6 children 1 of her daughter who is 54 tested positive We discussed the importance of early screening mammogram and consideration for prophylactic mastectomy and oophorectomy for her daughter  Cancer of right colon [facility]) Biopsy confirmed ovarian cancer diagnosis and not colon cancer We will resume tumor marker monitoring for colon cancer when she finished all her treatment  Cancer associated pain She has severe, uncontrolled cancer pain She was prescribed hydrocodone with minimal benefit I have reviewed her medication allergy I recommend trial of methadone I warned her about risk of constipation I will see her next week for review of pain management I also refill her prescription hydrocodone for breakthrough pain  Neuropathy, idiopathic 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 CBC with Differential ([place])  Standing Status:   Standing    Number of Occurrences:   20    Standing Expiration Date:   06/08/2020  . CMP ([place] only)    Standing Status:   Standing    Number of Occurrences:   20    Standing Expiration Date:   06/08/2020  . CA 125    Standing Status:   Standing    Number of Occurrences:   11    Standing Expiration Date:   06/08/2020    All questions were answered. The patient knows to call the clinic with any problems, questions or concerns. I spent 60 minutes counseling the patient face to face. The total time spent in the appointment was 80 minutes and more than 50% was on counseling.     Heath Lark, MD 06/09/2019 2:03 PM

## 2019-06-09 NOTE — Telephone Encounter (Signed)
Called Yarnell and informed her of appointments on 06/16/19 for patient education at 10 am, labs at 11:30 and Dr. Alvy Bimler at 12:00.  She verbalized understanding and agreement.

## 2019-06-09 NOTE — Progress Notes (Signed)
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

## 2019-06-09 NOTE — Assessment & Plan Note (Signed)
I have a long discussion with the patient The patient was crying nonstop in my office worrying about dying from her disease I explained to the patient why surgery or radiation is not indicated for recurrence of ovarian cancer With treatments, complete remission rate is high especially for BRCA mutation carriers The patient does not want port placement for treatment We discussed repeat blood work, chemo education class and appointment next week for chemotherapy consent and assessment of pain control  We reviewed the NCCN guidelines We discussed the role of chemotherapy. The intent is of curative intent.  We discussed some of the risks, benefits, side-effects of carboplatin & Taxol. Treatment is intravenous, every 3 weeks x 6 cycles  Some of the short term side-effects included, though not limited to, including weight loss, life threatening infections, risk of allergic reactions, need for transfusions of blood products, nausea, vomiting, change in bowel habits, loss of hair, admission to hospital for various reasons, and risks of death.   Long term side-effects are also discussed including risks of infertility, permanent damage to nerve function, hearing loss, chronic fatigue, kidney damage with possibility needing hemodialysis, and rare secondary malignancy including bone marrow disorders.  The patient is aware that the response rates discussed earlier is not guaranteed.  After a long discussion, patient made an informed decision to proceed with the prescribed plan of care.   Patient education material was dispensed. We discussed premedication with dexamethasone before chemotherapy. I will reduce the dose of Taxol upfront due to pre-existing peripheral neuropathy and calculated high dose of treatment I do not plan prophylactic G-CSF support Due to scheduling issue, we will proceed with chemotherapy on July 27

## 2019-06-09 NOTE — Assessment & Plan Note (Addendum)
Biopsy confirmed ovarian cancer diagnosis and not colon cancer We will resume tumor marker monitoring for colon cancer when she finished all her treatment

## 2019-06-09 NOTE — Assessment & Plan Note (Signed)
She has disease limited to her abdomen Response rate to chemotherapy is very high The goal of treatment is curative intent If she has complete response with chemotherapy, I plan to give her PARP inhibitor in the future for maintenance treatment

## 2019-06-10 ENCOUNTER — Telehealth: Payer: Self-pay | Admitting: Hematology and Oncology

## 2019-06-10 NOTE — Telephone Encounter (Signed)
I left a message regarding schedule  

## 2019-06-13 ENCOUNTER — Telehealth: Payer: Self-pay | Admitting: *Deleted

## 2019-06-13 NOTE — Telephone Encounter (Signed)
PA for Lyrica (brand name) completed on covermymeds. (key [id number]).  [id number].  Coverage with [employer] [phone]).  Approved through 11/24/2019.

## 2019-06-16 ENCOUNTER — Encounter: Payer: Self-pay | Admitting: Hematology and Oncology

## 2019-06-16 ENCOUNTER — Inpatient Hospital Stay: Payer: Medicare Other

## 2019-06-16 ENCOUNTER — Other Ambulatory Visit: Payer: Self-pay

## 2019-06-16 ENCOUNTER — Other Ambulatory Visit: Payer: Self-pay | Admitting: Internal Medicine

## 2019-06-16 ENCOUNTER — Inpatient Hospital Stay (HOSPITAL_BASED_OUTPATIENT_CLINIC_OR_DEPARTMENT_OTHER): Payer: Medicare Other | Admitting: Hematology and Oncology

## 2019-06-16 DIAGNOSIS — C562 Malignant neoplasm of left ovary: Secondary | ICD-10-CM | POA: Diagnosis not present

## 2019-06-16 DIAGNOSIS — Z808 Family history of malignant neoplasm of other organs or systems: Secondary | ICD-10-CM

## 2019-06-16 DIAGNOSIS — C786 Secondary malignant neoplasm of retroperitoneum and peritoneum: Secondary | ICD-10-CM | POA: Diagnosis not present

## 2019-06-16 DIAGNOSIS — Z886 Allergy status to analgesic agent status: Secondary | ICD-10-CM

## 2019-06-16 DIAGNOSIS — I700 Atherosclerosis of aorta: Secondary | ICD-10-CM | POA: Diagnosis not present

## 2019-06-16 DIAGNOSIS — Z9049 Acquired absence of other specified parts of digestive tract: Secondary | ICD-10-CM

## 2019-06-16 DIAGNOSIS — C182 Malignant neoplasm of ascending colon: Secondary | ICD-10-CM | POA: Diagnosis not present

## 2019-06-16 DIAGNOSIS — Z79899 Other long term (current) drug therapy: Secondary | ICD-10-CM

## 2019-06-16 DIAGNOSIS — I10 Essential (primary) hypertension: Secondary | ICD-10-CM

## 2019-06-16 DIAGNOSIS — J439 Emphysema, unspecified: Secondary | ICD-10-CM

## 2019-06-16 DIAGNOSIS — Z90710 Acquired absence of both cervix and uterus: Secondary | ICD-10-CM

## 2019-06-16 DIAGNOSIS — Z801 Family history of malignant neoplasm of trachea, bronchus and lung: Secondary | ICD-10-CM

## 2019-06-16 DIAGNOSIS — Z833 Family history of diabetes mellitus: Secondary | ICD-10-CM

## 2019-06-16 DIAGNOSIS — Z5111 Encounter for antineoplastic chemotherapy: Secondary | ICD-10-CM | POA: Diagnosis not present

## 2019-06-16 DIAGNOSIS — Z885 Allergy status to narcotic agent status: Secondary | ICD-10-CM

## 2019-06-16 DIAGNOSIS — D261 Other benign neoplasm of corpus uteri: Secondary | ICD-10-CM

## 2019-06-16 DIAGNOSIS — Z880 Allergy status to penicillin: Secondary | ICD-10-CM

## 2019-06-16 DIAGNOSIS — Z8041 Family history of malignant neoplasm of ovary: Secondary | ICD-10-CM

## 2019-06-16 DIAGNOSIS — Z803 Family history of malignant neoplasm of breast: Secondary | ICD-10-CM

## 2019-06-16 DIAGNOSIS — F17290 Nicotine dependence, other tobacco product, uncomplicated: Secondary | ICD-10-CM

## 2019-06-16 DIAGNOSIS — R918 Other nonspecific abnormal finding of lung field: Secondary | ICD-10-CM | POA: Diagnosis not present

## 2019-06-16 DIAGNOSIS — I739 Peripheral vascular disease, unspecified: Secondary | ICD-10-CM

## 2019-06-16 DIAGNOSIS — G893 Neoplasm related pain (acute) (chronic): Secondary | ICD-10-CM | POA: Diagnosis not present

## 2019-06-16 DIAGNOSIS — G609 Hereditary and idiopathic neuropathy, unspecified: Secondary | ICD-10-CM | POA: Diagnosis not present

## 2019-06-16 DIAGNOSIS — Z8249 Family history of ischemic heart disease and other diseases of the circulatory system: Secondary | ICD-10-CM

## 2019-06-16 DIAGNOSIS — R590 Localized enlarged lymph nodes: Secondary | ICD-10-CM

## 2019-06-16 DIAGNOSIS — F1290 Cannabis use, unspecified, uncomplicated: Secondary | ICD-10-CM

## 2019-06-16 LAB — CBC WITH DIFFERENTIAL (CANCER CENTER ONLY)
Abs Immature Granulocytes: 0.01 10*3/uL (ref 0.00–0.07)
Basophils Absolute: 0 10*3/uL (ref 0.0–0.1)
Basophils Relative: 0 %
Eosinophils Absolute: 0.1 10*3/uL (ref 0.0–0.5)
Eosinophils Relative: 1 %
HCT: 39.9 % (ref 36.0–46.0)
Hemoglobin: 14.2 g/dL (ref 12.0–15.0)
Immature Granulocytes: 0 %
Lymphocytes Relative: 36 %
Lymphs Abs: 2.7 10*3/uL (ref 0.7–4.0)
MCH: 35.9 pg — ABNORMAL HIGH (ref 26.0–34.0)
MCHC: 35.6 g/dL (ref 30.0–36.0)
MCV: 101 fL — ABNORMAL HIGH (ref 80.0–100.0)
Monocytes Absolute: 0.6 10*3/uL (ref 0.1–1.0)
Monocytes Relative: 8 %
Neutro Abs: 4.2 10*3/uL (ref 1.7–7.7)
Neutrophils Relative %: 55 %
Platelet Count: 154 10*3/uL (ref 150–400)
RBC: 3.95 MIL/uL (ref 3.87–5.11)
RDW: 13.1 % (ref 11.5–15.5)
WBC Count: 7.6 10*3/uL (ref 4.0–10.5)
nRBC: 0 % (ref 0.0–0.2)

## 2019-06-16 LAB — CMP (CANCER CENTER ONLY)
ALT: 9 U/L (ref 0–44)
AST: 15 U/L (ref 15–41)
Albumin: 4 g/dL (ref 3.5–5.0)
Alkaline Phosphatase: 66 U/L (ref 38–126)
Anion gap: 10 (ref 5–15)
BUN: 9 mg/dL (ref 6–20)
CO2: 26 mmol/L (ref 22–32)
Calcium: 9.5 mg/dL (ref 8.9–10.3)
Chloride: 107 mmol/L (ref 98–111)
Creatinine: 0.86 mg/dL (ref 0.44–1.00)
GFR, Est AFR Am: >60 mL/min (ref >60)
GFR, Estimated: >60 mL/min (ref >60)
Glucose, Bld: 94 mg/dL (ref 70–99)
Potassium: 4.1 mmol/L (ref 3.5–5.1)
Sodium: 143 mmol/L (ref 135–145)
Total Bilirubin: 0.5 mg/dL (ref 0.3–1.2)
Total Protein: 7.5 g/dL (ref 6.5–8.1)

## 2019-06-16 NOTE — Progress Notes (Signed)
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 [facility]) I reviewed the plan of care with the patient again We discussed the role of treatment She will continue chemotherapy for minimum 3 cycles of dosing before repeat imaging study I plan reduced dose chemotherapy for cycle 1 I do not plan prophylactic G-CSF support I reviewed with her the plan for premedication dexamethasone  Cancer associated pain Her pain is better controlled She has not started taking methadone I suspect her severe pain from her last visit could be exacerbated by anxiety We reviewed plan of care in terms of medical management I reminded her about prescription narcotic refill policy  Neuropathy, idiopathic She had pre-existing neuropathy and is on Lyrica Plan reduced dose Taxol   No orders of the defined types were placed in this encounter.   INTERVAL HISTORY: Please see below for problem oriented charting. She returns for further follow-up The patient appears cheerful today She felt that her pain is well controlled although she has not started taking methadone She denies abdominal pain, bloating or changes in bowel habits She is confident to start treatment as scheduled next week  SUMMARY OF ONCOLOGIC HISTORY: Oncology History Overview Note  BRCA1 positive   Cancer of right colon ([place])  09/16/2014 Imaging   CT abd/pel:  a 3.8cm cecal mass with associated ileocecal intussusception, and a 9.1cm solid and cystic mass in the left pelvis,   09/18/2014 Initial Diagnosis    Adenocarcinoma of right colon   09/18/2014 Pathologic Stage   pT3pN1Mx, tumor extends into pericolonic soft tissue and is less than 69m from the serosal surface, LVI 8-), PNI (-), all 23 node negative, one soft tissue tumor deposit, surgical margins negative.    09/18/2014 Surgery   right hemicolectomy and terminal ileoectomy   08/06/2016 Imaging   CT chest/abd/pelvis with contrast IMPRESSION: 1. No recurrent malignancy identified. 2. 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 On the prior exam from 09/13/2015 there was back flow of contrast in the left external iliac artery to supply the left internal iliac artery ; this back flow of contrast is no longer present in the left external iliac artery is occluded. 3. Several pulmonary nodules at or below 4 mm diameter, unchanged. 4. 3 mm hypodensity posteriorly in the body of the pancreas, not well seen previously and subtle today, likely incidental, but merits observation. 5. 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 Prominent degenerative spurring of both acetabula. 6. Lumbar spondylosis and degenerative disc disease causing prominent impingement at L4-5 and mild impingement at L5-S1.   03/08/2017 Imaging   CT CAP W Contrast 03/08/17 IMPRESSION: Chest Impression: 1. Stable small pulmonary nodules within LEFT lung. 2. No mediastinal lymphadenopathy. Abdomen / Pelvis Impression: 1. No evidence of metastatic colorectal cancer or ovarian cancer within the abdomen pelvis. 2. No lymphadenopathy. 3. Postsurgical change consistent RIGHT hemicolectomy and hysterectomy 4. A fem-fem arterial bypass noted.   11/13/2017 Imaging   CT AP w contrast IMPRESSION: 1. No findings of recurrent malignancy in the abdomen or pelvis. 2. Other imaging findings of potential  clinical significance: Chronic AVN of both femoral heads without contour  abnormality. Lumbar impingement at L4-5 and L5-S1. Aortic Atherosclerosis (ICD10-I70.0). 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 Partial right hemicolectomy. Hysterectomy.   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 [facility])  06/20/2019 -  Chemotherapy   The patient had palonosetron (ALOXI) injection 0.25 mg, 0.25 mg, Intravenous,  Once, 0 of 6 cycles CARBOplatin (PARAPLATIN) 600 mg in sodium chloride 0.9 % 250 mL chemo infusion, 600 mg (100 % of original dose 600 mg), Intravenous,  Once, 0 of 6 cycles Dose modification:   (original dose 600 mg, Cycle 1), 600 mg (original dose 600 mg, Cycle 1) PACLitaxel (TAXOL) 252 mg in sodium chloride 0.9 % 250 mL chemo infusion (> 22m/m2), 131.25 mg/m2 = 252 mg (75 % of original dose 175 mg/m2), Intravenous,  Once, 0 of 6 cycles Dose modification: 131.25 mg/m2 (75 % of original dose 175 mg/m2, Cycle 1, Reason: Provider Judgment) fosaprepitant (EMEND) 150 mg, dexamethasone (DECADRON) 12 mg in sodium chloride 0.9 % 145 mL IVPB, , Intravenous,  Once, 0 of 6 cycles  for chemotherapy treatment.    10/09/2014 Imaging   CT of the abdomen and pelvis showed a 9.1 cm solid and cystic mass in the left pelvis concerning for malignancy. This was discovered during her workup for colon cancer.   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 Tumor size 3.7 cm, no lymph nodes removed. T1cNx. Tumor cells are positive for cytokeratin 7 and estrogen receptor.   11/28/2014 Cancer Staging   Staging form: Ovary, AJCC 7th Edition - Clinical: Stage III (rT3, N0, M0) - Signed by GHeath Lark MD on 06/09/2019   03/08/2017 Imaging   CT CAP W Contrast 03/08/17 IMPRESSION: Chest Impression: 1. Stable small  pulmonary nodules within LEFT lung. 2. No mediastinal lymphadenopathy. Abdomen / Pelvis Impression: 1. No evidence of metastatic colorectal cancer or ovarian cancer within the abdomen pelvis. 2. No lymphadenopathy. 3. Postsurgical change consistent RIGHT hemicolectomy and hysterectomy 4. A fem-fem arterial bypass noted.   03/12/2017 Mammogram   MM Right Breast 03/12/17 IMPRESSION: No mammographic or sonographic evidence of malignancy. No correlate identified for the abnormal enhancement seen within the right nipple on MRI of 02/27/2017.    04/02/2017 Pathology Results   Surgical Pathology of Nipple Biopsy: 04/02/17 Diagnosis Nipple Biopsy, Right - BENIGN NIPPLE TISSUE. - NO ATYPIA OR MALIGNANCY.    11/13/2017 Imaging   1. No findings of recurrent malignancy in the abdomen or pelvis. 2. Other imaging findings of potential clinical significance: Chronic AVN of both femoral heads without contour abnormality. Lumbar impingement at L4-5 and L5-S1. Aortic Atherosclerosis (ICD10-I70.0). 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 Partial right hemicolectomy. Hysterectomy.   03/22/2018 Mammogram   Screening mammogram IMPRESSION No evidence of malignancy.     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   06/06/2019 Imaging   1. Tiny bilateral pulmonary nodules stable since 11/12/2018. Continued close attention on follow-up recommended. 2. Incompletely visualized  upper abdominal lymphadenopathy better characterized on the abdomen CT from 2 weeks ago. 3.  Aortic Atherosclerois (ICD10-170.0) 4.  Emphysema. ([id number]-I37.9)   06/07/2019 Pathology Results   Soft Tissue Needle Core Biopsy, peritoneal nodule  within right lower abdomen - METASTATIC CARCINOMA CONSISTENT WITH PATIENT'S CLINICAL HISTORY OF PRIMARY OVARIAN CARCINOMA. SEE NOTE Diagnosis Note Immunohistochemical stains show that the tumor cells are positive for CK7, ER and PAX 8; and negative for CK20 and CDX2. This immunostaining profile is consistent with above diagnosis.   06/07/2019 Procedure   Technically successful CT guided core needle biopsy of indeterminate peritoneal nodule within the right lower abdomen/pelvis.     REVIEW OF SYSTEMS:   Constitutional: Denies fevers, chills or abnormal weight loss Eyes: Denies blurriness of vision Ears, nose, mouth, throat, and face: Denies mucositis or sore throat Respiratory: Denies cough, dyspnea or wheezes Cardiovascular: Denies palpitation, chest discomfort or lower extremity swelling Gastrointestinal:  Denies nausea, heartburn or change in bowel habits Skin: Denies abnormal skin rashes Lymphatics: Denies new lymphadenopathy or easy bruising Neurological:Denies numbness, tingling or new weaknesses Behavioral/Psych: Mood is stable, no new changes  All other systems were reviewed with the patient and are negative.  I have reviewed the past medical history, past surgical history, social history and family history with the patient and they are unchanged from previous note.  ALLERGIES:  is allergic to other; demerol [meperidine]; morphine and related; oxycodone; penicillins; tylenol [acetaminophen]; and ibuprofen.  MEDICATIONS:  Current Outpatient Medications  Medication Sig Dispense Refill  . acetaminophen (TYLENOL) 325 MG tablet Take 325 mg by mouth as needed.    [name] dexamethasone (DECADRON) 4 MG tablet Take 3 tabs at the night before and 3 tab the morning of chemotherapy, every 3 weeks, by mouth for 6 cycles 36 tablet 9  . docusate sodium 100 MG CAPS Take 100 mg by mouth 2 (two) times daily as needed for mild constipation. 10 capsule 0  . HYDROcodone-acetaminophen (NORCO/VICODIN)  5-325 MG tablet Take 1 tablet by mouth every 6 (six) hours as needed for moderate pain. 40 tablet 0  . lidocaine (LIDODERM) 5 % PLACE 1 PATCH ONTO THE SKIN EVERY DAY.REMOVE AND DISCARD PATCH WITHIN 12 HOURS OR AS DIRECTED BY PRESCRIBER 30 patch 1  . LORazepam (ATIVAN) 0.5 MG tablet Take 1 tablet (0.5 mg total) by mouth 3 (three) times daily as needed for anxiety. 10 tablet 0  . losartan (COZAAR) 50 MG tablet Take 1 tablet (50 mg total) by mouth daily. Need to establish with new provider for future refills. 30 tablet 1  . methadone (DOLOPHINE) 10 MG tablet Take 1 tablet (10 mg total) by mouth every 12 (twelve) hours. 60 tablet 0  . ondansetron (ZOFRAN) 8 MG tablet Take 1 tablet (8 mg total) by mouth every 8 (eight) hours as needed for nausea or vomiting. 30 tablet 1  . pregabalin (LYRICA) 200 MG capsule Take 1 capsule (200 mg total) by mouth every 8 (eight) hours. Please give patient brand name Lyrica 90 capsule 5  . prochlorperazine (COMPAZINE) 10 MG tablet Take 1 tablet (10 mg total) by mouth every 6 (six) hours as needed (Nausea or vomiting). 30 tablet 1   No current facility-administered medications for this visit.     PHYSICAL EXAMINATION: ECOG PERFORMANCE STATUS: 1 - Symptomatic but completely ambulatory  Vitals:   06/16/19 1209  BP: (!) 150/64  Pulse: 72  Resp: 18  Temp: 98 F (36.7 C)  SpO2: 100%   Filed Weights   06/16/19 1209  Weight: 166 lb 6.4 oz (75.5 kg)    GENERAL:alert, no distress and comfortable SKIN: skin color, texture, turgor are normal, no rashes or significant lesions EYES: normal, Conjunctiva are pink and non-injected, sclera clear OROPHARYNX:no exudate, no erythema and lips, buccal mucosa, and tongue normal  NECK: supple, thyroid normal size, non-tender, without nodularity LYMPH:  no palpable lymphadenopathy in the cervical, axillary or inguinal LUNGS: clear to auscultation and percussion with normal breathing effort HEART: regular rate & rhythm and no  murmurs and no lower extremity edema ABDOMEN:abdomen soft, non-tender and normal bowel sounds Musculoskeletal:no cyanosis of digits and no clubbing  NEURO: alert & oriented x 3 with fluent speech, no focal motor/sensory deficits  LABORATORY DATA:  I have reviewed the data as listed    Component Value Date/Time   NA 143 06/16/2019 1152   NA 141 10/07/2017 1436   K 4.1 06/16/2019 1152   K 4.4 10/07/2017 1436   CL 107 06/16/2019 1152   CO2 26 06/16/2019 1152   CO2 26 10/07/2017 1436   GLUCOSE 94 06/16/2019 1152   GLUCOSE 85 10/07/2017 1436   BUN 9 06/16/2019 1152   BUN 14.9 10/07/2017 1436   CREATININE 0.86 06/16/2019 1152   CREATININE 1.0 10/07/2017 1436   CALCIUM 9.5 06/16/2019 1152   CALCIUM 9.5 10/07/2017 1436   PROT 7.5 06/16/2019 1152   PROT 7.8 10/07/2017 1436   ALBUMIN 4.0 06/16/2019 1152   ALBUMIN 3.9 10/07/2017 1436   AST 15 06/16/2019 1152   AST 15 10/07/2017 1436   ALT 9 06/16/2019 1152   ALT 7 10/07/2017 1436   ALKPHOS 66 06/16/2019 1152   ALKPHOS 81 10/07/2017 1436   BILITOT 0.5 06/16/2019 1152   BILITOT 0.54 10/07/2017 1436   GFRNONAA >60 06/16/2019 1152   GFRAA >60 06/16/2019 1152    No results found for: SPEP, UPEP  Lab Results  Component Value Date   WBC 7.6 06/16/2019   NEUTROABS 4.2 06/16/2019   HGB 14.2 06/16/2019   HCT 39.9 06/16/2019   MCV 101.0 (H) 06/16/2019   PLT 154 06/16/2019      Chemistry      Component Value Date/Time   NA 143 06/16/2019 1152   NA 141 10/07/2017 1436   K 4.1 06/16/2019 1152   K 4.4 10/07/2017 1436   CL 107 06/16/2019 1152   CO2 26 06/16/2019 1152   CO2 26 10/07/2017 1436   BUN 9 06/16/2019 1152   BUN 14.9 10/07/2017 1436   CREATININE 0.86 06/16/2019 1152   CREATININE 1.0 10/07/2017 1436      Component Value Date/Time   CALCIUM 9.5 06/16/2019 1152   CALCIUM 9.5 10/07/2017 1436   ALKPHOS 66 06/16/2019 1152   ALKPHOS 81 10/07/2017 1436   AST 15 06/16/2019 1152   AST 15 10/07/2017 1436   ALT 9  06/16/2019 1152   ALT 7 10/07/2017 1436   BILITOT 0.5 06/16/2019 1152   BILITOT 0.54 10/07/2017 1436       RADIOGRAPHIC STUDIES: I have personally reviewed the radiological images as listed and agreed with the findings in the report. Ct Chest W Contrast  Result Date: 06/07/2019 CLINICAL DATA:  Colon and ovarian cancer. EXAM: CT CHEST WITH CONTRAST TECHNIQUE: Multidetector CT imaging of the chest was performed during intravenous contrast administration. CONTRAST:  19m OMNIPAQUE IOHEXOL 300 MG/ML  SOLN COMPARISON:  11/12/2018 FINDINGS: Cardiovascular: The heart size is normal. No substantial pericardial effusion. Coronary artery calcification is evident. Atherosclerotic calcification is noted in the wall of  the thoracic aorta. Mediastinum/Nodes: No mediastinal lymphadenopathy. There is no hilar lymphadenopathy. The esophagus has normal imaging features. There is no axillary lymphadenopathy. Lungs/Pleura: Centrilobular emphsyema noted. 4 mm right upper lobe nodule on 53/5 is stable. There is a 4 mm nodule in the left upper lobe (80/5) also unchanged. No pleural effusion. Upper Abdomen: 11 mm short axis gastrohepatic ligament lymph node has increased from 8 mm on the prior study. Other gastrohepatic ligament lymph nodes are evident. These were better characterized on abdomen CT of 05/24/2019 Musculoskeletal: No worrisome lytic or sclerotic osseous abnormality. IMPRESSION: 1. Tiny bilateral pulmonary nodules stable since 11/12/2018. Continued close attention on follow-up recommended. 2. Incompletely visualized upper abdominal lymphadenopathy better characterized on the abdomen CT from 2 weeks ago. 3.  Aortic Atherosclerois (ICD10-170.0) 4.  Emphysema. ([id number]) Electronically Signed   By: Misty Stanley M.D.   On: 06/07/2019 08:19   Ct Abdomen Pelvis W Contrast  Result Date: 05/24/2019 CLINICAL DATA:  Follow-up left ovarian carcinoma. Elevated CA 125 level. Previous right hemicolectomy for colon  carcinoma. EXAM: CT ABDOMEN AND PELVIS WITH CONTRAST TECHNIQUE: Multidetector CT imaging of the abdomen and pelvis was performed using the standard protocol following bolus administration of intravenous contrast. CONTRAST:  16m OMNIPAQUE IOHEXOL 300 MG/ML  SOLN COMPARISON:  11/13/2017 FINDINGS: Lower Chest: No acute findings. Hepatobiliary: No hepatic masses identified. Gallbladder is unremarkable. Pancreas:  No mass or inflammatory changes. Spleen: Within normal limits in size and appearance. Adrenals/Urinary Tract: No masses identified. No evidence of hydronephrosis. Unremarkable unopacified urinary bladder. Stomach/Bowel: No evidence of obstruction, inflammatory process or abnormal fluid collections. Vascular/Lymphatic: No pathologically enlarged lymph nodes. No abdominal aortic aneurysm. Aortic atherosclerosis. 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 With patent fem-fem bypass graft. Reproductive: Prior hysterectomy. New soft tissue density is seen in the hysterectomy bed and sigmoid mesocolon. New peritoneal soft tissue density is also seen in the right lower quadrant adjacent to the right: Which measures 2.2 cm. These findings are consistent with peritoneal carcinomatosis. No evidence of ascites. Other:  None. Musculoskeletal: No suspicious bone lesions identified. 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 Electronically Signed   By: JMarlaine HindM.D.   On: 05/24/2019 15:44   Ct Biopsy  Result Date: 06/07/2019 INDICATION: History of colon and ovarian cancer, now with peritoneal nodule within the right lower abdomen/pelvis. Please perform CT-guided biopsy for tissue diagnostic purposes. EXAM: CT-GUIDED RIGHT LOWER QUADRANT/PELVIC PERITONEAL NODULE BIOPSY COMPARISON:  CT abdomen and pelvis-05/24/2019 MEDICATIONS: None. ANESTHESIA/SEDATION: Fentanyl 100  mcg IV; Versed 2 mg IV Sedation time: 17 minutes; The patient was continuously monitored during the procedure by the interventional radiology nurse under my direct supervision. CONTRAST:  None. COMPLICATIONS: None immediate. PROCEDURE: Informed consent was obtained from the patient following an explanation of the procedure, risks, benefits and alternatives. A time out was performed prior to the initiation of the procedure. The patient was positioned supine on the CT table and a limited CT was performed for procedural planning demonstrating unchanged size and appearance of the peritoneal nodule located right lower abdomen/pelvis measuring approximately 2.5 x 1.8 cm (image 33, series 2). The procedure was planned. The operative site was prepped and draped in the usual sterile fashion. Appropriate trajectory was confirmed with a 22 gauge spinal needle after the adjacent tissues were anesthetized with 1% Lidocaine with epinephrine. Appropriate positioning was confirmed and 5 core needle biopsy samples were obtained  with an 18 gauge core needle biopsy device. The co-axial needle was removed following the administration of a Gel-Foam slurry and superficial hemostasis was achieved with manual compression. A limited postprocedural CT was negative for hemorrhage or additional complication. A dressing was placed. The patient tolerated the procedure well without immediate postprocedural complication. IMPRESSION: Technically successful CT guided core needle biopsy of indeterminate peritoneal nodule within the right lower abdomen/pelvis. Electronically Signed   By: Sandi Mariscal M.D.   On: 06/07/2019 14:25   Mm 3d Screen Breast Bilateral  Result Date: 05/26/2019 CLINICAL DATA:  Screening. EXAM: DIGITAL SCREENING BILATERAL MAMMOGRAM WITH TOMO AND CAD COMPARISON:  Previous exam(s). ACR Breast Density Category a: The breast tissue is almost entirely fatty. FINDINGS: There are no findings suspicious for malignancy. Images were  processed with CAD. IMPRESSION: No mammographic evidence of malignancy. A result letter of this screening mammogram will be mailed directly to the patient. RECOMMENDATION: Screening mammogram in one year. (Code:[id number]) BI-RADS CATEGORY  1: Negative. Electronically Signed   By: Franki Cabot M.D.   On: 05/26/2019 10:16    All questions were answered. The patient knows to call the clinic with any problems, questions or concerns. No barriers to learning was detected.  I spent 15 minutes counseling the patient face to face. The total time spent in the appointment was 20 minutes and more than 50% was on counseling and review of test results  Heath Lark, MD 06/16/2019 1:53 PM

## 2019-06-16 NOTE — Assessment & Plan Note (Signed)
Her pain is better controlled She has not started taking methadone I suspect her severe pain from her last visit could be exacerbated by anxiety We reviewed plan of care in terms of medical management I reminded her about prescription narcotic refill policy

## 2019-06-16 NOTE — Progress Notes (Signed)
Met with patient at registration to introduce myself as Arboriculturist and to offer available resources.  Discussed one-time $1000 Radio broadcast assistant to assist with personal expenses while going through treatment.  Gave her an application for the [employer] which assists cancer patients whom live in [place]. Advised what is needed and she can bring application and supporting documents here for me to email. She verbalized understanding.  She has my card for any additional financial questions or concerns.

## 2019-06-16 NOTE — Assessment & Plan Note (Signed)
I reviewed the plan of care with the patient again We discussed the role of treatment She will continue chemotherapy for minimum 3 cycles of dosing before repeat imaging study I plan reduced dose chemotherapy for cycle 1 I do not plan prophylactic G-CSF support I reviewed with her the plan for premedication dexamethasone

## 2019-06-16 NOTE — Assessment & Plan Note (Signed)
She had pre-existing neuropathy and is on Lyrica Plan reduced dose Taxol

## 2019-06-17 ENCOUNTER — Other Ambulatory Visit: Payer: Self-pay

## 2019-06-17 ENCOUNTER — Ambulatory Visit (INDEPENDENT_AMBULATORY_CARE_PROVIDER_SITE_OTHER): Payer: Medicare Other | Admitting: Family

## 2019-06-17 ENCOUNTER — Encounter: Payer: Self-pay | Admitting: Family

## 2019-06-17 VITALS — BP 134/68 | HR 77 | Temp 98.5°F | Ht 67.0 in | Wt 173.6 lb

## 2019-06-17 DIAGNOSIS — I10 Essential (primary) hypertension: Secondary | ICD-10-CM

## 2019-06-17 DIAGNOSIS — R200 Anesthesia of skin: Secondary | ICD-10-CM | POA: Diagnosis not present

## 2019-06-17 DIAGNOSIS — C562 Malignant neoplasm of left ovary: Secondary | ICD-10-CM | POA: Diagnosis not present

## 2019-06-17 LAB — CA 125: Cancer Antigen (CA) 125: 499 U/mL — ABNORMAL HIGH (ref 0.0–38.1)

## 2019-06-17 MED ORDER — LOSARTAN POTASSIUM 50 MG PO TABS: 50.0000 mg | ORAL_TABLET | Freq: Every day | ORAL | 2 refills | Status: DC

## 2019-06-17 NOTE — Progress Notes (Signed)
Catherine Rogers is a 58 y.o. female with the following history as recorded in EpicCare:  Patient Active Problem List   Diagnosis Date Noted  . Goals of care, counseling/discussion 06/09/2019  . Left leg pain 06/08/2019  . Nausea 09/17/2017  . Neuropathy, idiopathic 09/17/2017  . Chronic hepatitis C without hepatic coma ([place]) 12/31/2016  . Routine general medical examination at a health care facility 09/04/2016  . Vitamin D deficiency 09/04/2016  . Medicare annual wellness visit, subsequent 09/04/2016  . Type II CRPS (complex regional pain syndrome) 10/22/2015  . Numbness of left foot 07/05/2015  . BRCA1 genetic carrier 05/12/2015  . PAD (peripheral artery disease) ([place]) 04/18/2015  . Genetic testing 04/06/2015  . Family history of ovarian cancer   . Lumbosacral plexopathy 01/29/2015  . Left leg numbness 01/05/2015  . Postoperative anemia due to acute blood loss 12/28/2014  . Hypertension 12/22/2014  . Anxiety 12/22/2014  . Cancer associated pain 12/22/2014  . 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 ([place]) 11/28/2014  . Cancer of right colon ([place]) 10/11/2014  . Intussusception, ileocecal ([place]) 09/18/2014  . Mass of pelvis 09/18/2014  . Alcohol use 09/16/2014    Current Outpatient Medications  Medication Sig Dispense Refill  . acetaminophen (TYLENOL) 325 MG tablet Take 325 mg by mouth as needed.    [name] dexamethasone (DECADRON) 4 MG tablet Take 3 tabs at the night before and 3 tab the morning of chemotherapy, every 3 weeks, by mouth for 6 cycles 36 tablet 9  . docusate sodium 100 MG CAPS Take 100 mg by mouth 2 (two) times daily as needed for mild constipation. 10 capsule 0  . HYDROcodone-acetaminophen (NORCO/VICODIN) 5-325 MG tablet Take 1 tablet by mouth every 6 (six) hours as needed for moderate pain. 40 tablet 0  . lidocaine (LIDODERM) 5 % PLACE 1 PATCH ONTO THE SKIN EVERY DAY.REMOVE AND DISCARD PATCH WITHIN 12 HOURS OR AS DIRECTED BY PRESCRIBER 30 patch 1  . LORazepam (ATIVAN) 0.5 MG tablet Take 1  tablet (0.5 mg total) by mouth 3 (three) times daily as needed for anxiety. 10 tablet 0  . losartan (COZAAR) 50 MG tablet Take 1 tablet (50 mg total) by mouth daily. 90 tablet 2  . methadone (DOLOPHINE) 10 MG tablet Take 1 tablet (10 mg total) by mouth every 12 (twelve) hours. 60 tablet 0  . ondansetron (ZOFRAN) 8 MG tablet Take 1 tablet (8 mg total) by mouth every 8 (eight) hours as needed for nausea or vomiting. 30 tablet 1  . pregabalin (LYRICA) 200 MG capsule Take 1 capsule (200 mg total) by mouth every 8 (eight) hours. Please give patient brand name Lyrica 90 capsule 5  . prochlorperazine (COMPAZINE) 10 MG tablet Take 1 tablet (10 mg total) by mouth every 6 (six) hours as needed (Nausea or vomiting). 30 tablet 1   No current facility-administered medications for this visit.     Allergies: Other, Demerol [meperidine], Morphine and related, Oxycodone, Penicillins, Tylenol [acetaminophen], and Ibuprofen  Past Medical History:  Diagnosis Date  . Anemia   . Anxiety   . Arthritis   . Asthma   . Cancer ([place]) dx'd 08/2014   colon and ovarian  . Complication of anesthesia    woke and saw long tube coming out of mouth . sat up then went back to sleep  . Depression   . Ectopic pregnancy   . Family history of adverse reaction to anesthesia    sister on dialysis- hypotension   . Family history of ovarian cancer   .  Head injury, closed, with brief LOC ([place])    had a cut- as a child  . Headache   . Hepatitis    being treated for Hep C  . History of blood transfusion    2005 approximately   . Hypertension    no meds now  . PAD (peripheral artery disease) ([place]) 04/18/2015  . Pelvic cyst   . PONV (postoperative nausea and vomiting)   . PTSD (post-traumatic stress disorder)     Past Surgical History:  Procedure Laterality Date  . ABDOMINAL HYSTERECTOMY    . BREAST BIOPSY Right 04/02/2017   Procedure: INCISIONAL BIOPSY RIGHT NIPPLE;  Surgeon: Coralie Keens, MD;  Location: [facility];  Service: General;  Laterality: Right;  . COLONOSCOPY    . ESOPHAGOGASTRODUODENOSCOPY N/A 09/17/2014   Procedure: ESOPHAGOGASTRODUODENOSCOPY (EGD);  Surgeon: Jeryl Columbia, MD;  Location: [facility] ENDOSCOPY;  Service: Endoscopy;  Laterality: N/A;  . FEMORAL-FEMORAL BYPASS GRAFT Bilateral 04/18/2015   Procedure:  RIGHT COMMON FEMORAL TO LEFT COMMON  FEMORAL  ARTERY BYPASS WITH  8 MM HEMASHIELD GRAFT,RIGHT COMMON SUPERFICIAL FEMORAL ARTERY ENDARTERECTOMY;  Surgeon: Elam Dutch, MD;  Location: [facility];  Service: Vascular;  Laterality: Bilateral;  . ILEOSTOMY Right 09/18/2014   Procedure: ILEOCOLOSTOMY;  Surgeon: Doreen Salvage, MD;  Location: [facility];  Service: General;  Laterality: Right;  . LAPAROTOMY N/A 11/01/2014   Procedure: EXPLORATORY LAPAROTOMY;  Surgeon: Doreen Salvage, MD;  Location: [facility];  Service: General;  Laterality: N/A;  . LAPAROTOMY N/A 12/26/2014   Procedure:  LAPAROTOMY ;  Surgeon: Everitt Amber, MD;  Location: [facility];  Service: Gynecology;  Laterality: N/A;  . MASS EXCISION  11/01/2014   Procedure: EXCISION PELVIC MASS;  Surgeon: Doreen Salvage, MD;  Location: [facility];  Service: General;;  . OOPHORECTOMY  2016  . PARTIAL COLECTOMY Right 09/18/2014   Procedure: RIGHT HEMI COLECTOMY;  Surgeon: Doreen Salvage, MD;  Location: [facility];  Service: General;  Laterality: Right;  . ROBOTIC ASSISTED TOTAL HYSTERECTOMY WITH BILATERAL SALPINGO OOPHERECTOMY Right 12/26/2014   Procedure: EXPLORATORY LAPAROSCOPY  RIGHT SALPINGO OOPHORECTOMY OMENTECTOMY LYMPHADECTOMY ;  Surgeon: Everitt Amber, MD;  Location: [facility];  Service: Gynecology;  Laterality: Right;  . TUBAL LIGATION      Family History  Problem Relation Age of Onset  . Brain cancer Sister 34  . Lung cancer Brother 33  . Heart attack Mother   . Heart attack Father        4s  . Heart disease Father   . Heart attack Maternal Uncle   . Diabetes Maternal Uncle   . Breast cancer Maternal Uncle   . Ovarian cancer Paternal Aunt        3 paternal aunts with  ovarian cancer  . Heart attack Brother   . Diabetes Paternal Uncle     Social History   Tobacco Use  . Smoking status: Current Some Day Smoker    Years: 10.00    Types: Cigars  . Smokeless tobacco: Never Used  . Tobacco comment: pack per month of cigars = 5  Substance Use Topics  . Alcohol use: No    Subjective:  Patient presents to transfer care from PCP who has recently left our office; needs refill on blood pressure medication; doing well on Losartan 50 mg daily; Denies any chest pain, shortness of breath, blurred vision or headache. Recently diagnosed with metastatic ovarian cancer- will be starting chemotherapy on Monday; will be starting Methadone today for pain. Working with neurology  for chronic neuropathic pain- [facility];     Objective:  Vitals:   06/17/19 1500  BP: 134/68  Pulse: 77  Temp: 98.5 F (36.9 C)  TempSrc: Oral  SpO2: 95%  Weight: 173 lb 9.6 oz (78.7 kg)  Height: _0  (1.702 m)    General: Well developed, well nourished, in no acute distress  Skin : Warm and dry.  Head: Normocephalic and atraumatic  Lungs: Respirations unlabored; clear to auscultation bilaterally without wheeze, rales, rhonchi  CVS exam: normal rate and regular rhythm.  Neurologic: Alert and oriented; speech intact; face symmetrical; moves all extremities well; CNII-XII intact without focal deficit   Assessment:  1. Essential hypertension   2. 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 ([place])   3. Left leg numbness     Plan:  1. Stable; continue same medication; refill updated; will plan to see patient again in about 9 months; if she needs OV in the interim, will try to do virtual due to immunocompromised status. 2. Starting chemotherapy on Monday; has positive outlook- feels that she has a good support system; knows she can reach out if she needs anything; will let oncology manage pain medications at this time. 3. Under care of neurology- [facility];   No follow-ups on file.  No  orders of the defined types were placed in this encounter.   Requested Prescriptions   Signed Prescriptions Disp Refills  . losartan (COZAAR) 50 MG tablet 90 tablet 2    Sig: Take 1 tablet (50 mg total) by mouth daily.

## 2019-06-20 ENCOUNTER — Other Ambulatory Visit: Payer: Self-pay

## 2019-06-20 ENCOUNTER — Inpatient Hospital Stay: Payer: Medicare Other

## 2019-06-20 VITALS — BP 176/104 | HR 83 | Temp 97.9°F | Resp 17

## 2019-06-20 DIAGNOSIS — C562 Malignant neoplasm of left ovary: Secondary | ICD-10-CM

## 2019-06-20 DIAGNOSIS — Z5111 Encounter for antineoplastic chemotherapy: Secondary | ICD-10-CM | POA: Diagnosis not present

## 2019-06-20 MED ORDER — SODIUM CHLORIDE 0.9 % IV SOLN
Freq: Once | INTRAVENOUS | Status: AC
  Administered 2019-06-20: 09:00:00 via INTRAVENOUS
  Filled 2019-06-20: qty 5

## 2019-06-20 MED ORDER — SODIUM CHLORIDE 0.9 % IV SOLN
131.2500 mg/m2 | Freq: Once | INTRAVENOUS | Status: AC
  Administered 2019-06-20: 252 mg via INTRAVENOUS
  Filled 2019-06-20: qty 42

## 2019-06-20 MED ORDER — PALONOSETRON HCL INJECTION 0.25 MG/5ML
INTRAVENOUS | Status: AC
  Filled 2019-06-20: qty 5

## 2019-06-20 MED ORDER — FAMOTIDINE IN NACL 20-0.9 MG/50ML-% IV SOLN
20.0000 mg | Freq: Once | INTRAVENOUS | Status: AC
  Administered 2019-06-20: 20 mg via INTRAVENOUS

## 2019-06-20 MED ORDER — FAMOTIDINE IN NACL 20-0.9 MG/50ML-% IV SOLN
INTRAVENOUS | Status: AC
  Filled 2019-06-20: qty 50

## 2019-06-20 MED ORDER — SODIUM CHLORIDE 0.9 % IV SOLN
670.0000 mg | Freq: Once | INTRAVENOUS | Status: AC
  Administered 2019-06-20: 670 mg via INTRAVENOUS
  Filled 2019-06-20: qty 67

## 2019-06-20 MED ORDER — DIPHENHYDRAMINE HCL 50 MG/ML IJ SOLN
INTRAMUSCULAR | Status: AC
  Filled 2019-06-20: qty 1

## 2019-06-20 MED ORDER — SODIUM CHLORIDE 0.9 % IV SOLN
Freq: Once | INTRAVENOUS | Status: AC
  Administered 2019-06-20: 08:00:00 via INTRAVENOUS
  Filled 2019-06-20: qty 250

## 2019-06-20 MED ORDER — PALONOSETRON HCL INJECTION 0.25 MG/5ML
0.2500 mg | Freq: Once | INTRAVENOUS | Status: AC
  Administered 2019-06-20: 0.25 mg via INTRAVENOUS

## 2019-06-20 MED ORDER — DIPHENHYDRAMINE HCL 50 MG/ML IJ SOLN
50.0000 mg | Freq: Once | INTRAMUSCULAR | Status: AC
  Administered 2019-06-20: 50 mg via INTRAVENOUS

## 2019-06-20 NOTE — Progress Notes (Signed)
Per Dr. Alvy Bimler, ok to proceed with treatment with BP 173/95. Pt did not take her BP medication until 8:10am. Will recheck BP prior to and during taxol administration.

## 2019-06-20 NOTE — Patient Instructions (Signed)
[facility] Discharge Instructions for Patients Receiving Chemotherapy  Today you received the following chemotherapy agents: paclitaxel (Taxol) and carboplatin (Paraplatin)  To help prevent nausea and vomiting after your treatment, we encourage you to take your nausea medication as prescribed by your physician.    If you develop nausea and vomiting that is not controlled by your nausea medication, call the clinic.   BELOW ARE SYMPTOMS THAT SHOULD BE REPORTED IMMEDIATELY:  *FEVER GREATER THAN 100.5 F  *CHILLS WITH OR WITHOUT FEVER  NAUSEA AND VOMITING THAT IS NOT CONTROLLED WITH YOUR NAUSEA MEDICATION  *UNUSUAL SHORTNESS OF BREATH  *UNUSUAL BRUISING OR BLEEDING  TENDERNESS IN MOUTH AND THROAT WITH OR WITHOUT PRESENCE OF ULCERS  *URINARY PROBLEMS  *BOWEL PROBLEMS  UNUSUAL RASH Items with * indicate a potential emergency and should be followed up as soon as possible.  Feel free to call the clinic should you have any questions or concerns. The clinic phone number is [phone].  Please show the [facility] at check-in to the Emergency Department and triage nurse.  Paclitaxel injection What is this medicine? PACLITAXEL (PAK li TAX el) is a chemotherapy drug. It targets fast dividing cells, like cancer cells, and causes these cells to die. This medicine is used to treat ovarian cancer, breast cancer, lung cancer, Kaposi's sarcoma, and other cancers. This medicine may be used for other purposes; ask your health care provider or pharmacist if you have questions. COMMON BRAND NAME(S): Onxol, Taxol What should I tell my health care provider before I take this medicine? They need to know if you have any of these conditions:  history of irregular heartbeat  liver disease  low blood counts, like low white cell, platelet, or red cell counts  lung or breathing disease, like asthma  tingling of the fingers or toes, or other nerve disorder  an unusual or  allergic reaction to paclitaxel, alcohol, polyoxyethylated castor oil, other chemotherapy, other medicines, foods, dyes, or preservatives  pregnant or trying to get pregnant  breast-feeding How should I use this medicine? This drug is given as an infusion into a vein. It is administered in a hospital or clinic by a specially trained health care professional. Talk to your pediatrician regarding the use of this medicine in children. Special care may be needed. Overdosage: If you think you have taken too much of this medicine contact a poison control center or emergency room at once. NOTE: This medicine is only for you. Do not share this medicine with others. What if I miss a dose? It is important not to miss your dose. Call your doctor or health care professional if you are unable to keep an appointment. What may interact with this medicine? Do not take this medicine with any of the following medications:  disulfiram  metronidazole This medicine may also interact with the following medications:  antiviral medicines for hepatitis, HIV or AIDS  certain antibiotics like erythromycin and clarithromycin  certain medicines for fungal infections like ketoconazole and itraconazole  certain medicines for seizures like carbamazepine, phenobarbital, phenytoin  gemfibrozil  nefazodone  rifampin  St. John's wort This list may not describe all possible interactions. Give your health care provider a list of all the medicines, herbs, non-prescription drugs, or dietary supplements you use. Also tell them if you smoke, drink alcohol, or use illegal drugs. Some items may interact with your medicine. What should I watch for while using this medicine? Your condition will be monitored carefully while you are receiving  this medicine. You will need important blood work done while you are taking this medicine. This medicine can cause serious allergic reactions. To reduce your risk you will need to take  other medicine(s) before treatment with this medicine. If you experience allergic reactions like skin rash, itching or hives, swelling of the face, lips, or tongue, tell your doctor or health care professional right away. In some cases, you may be given additional medicines to help with side effects. Follow all directions for their use. This drug may make you feel generally unwell. This is not uncommon, as chemotherapy can affect healthy cells as well as cancer cells. Report any side effects. Continue your course of treatment even though you feel ill unless your doctor tells you to stop. Call your doctor or health care professional for advice if you get a fever, chills or sore throat, or other symptoms of a cold or flu. Do not treat yourself. This drug decreases your body's ability to fight infections. Try to avoid being around people who are sick. This medicine may increase your risk to bruise or bleed. Call your doctor or health care professional if you notice any unusual bleeding. Be careful brushing and flossing your teeth or using a toothpick because you may get an infection or bleed more easily. If you have any dental work done, tell your dentist you are receiving this medicine. Avoid taking products that contain aspirin, acetaminophen, ibuprofen, naproxen, or ketoprofen unless instructed by your doctor. These medicines may hide a fever. Do not become pregnant while taking this medicine. Women should inform their doctor if they wish to become pregnant or think they might be pregnant. There is a potential for serious side effects to an unborn child. Talk to your health care professional or pharmacist for more information. Do not breast-feed an infant while taking this medicine. Men are advised not to father a child while receiving this medicine. This product may contain alcohol. Ask your pharmacist or healthcare provider if this medicine contains alcohol. Be sure to tell all healthcare providers you  are taking this medicine. Certain medicines, like metronidazole and disulfiram, can cause an unpleasant reaction when taken with alcohol. The reaction includes flushing, headache, nausea, vomiting, sweating, and increased thirst. The reaction can last from 30 minutes to several hours. What side effects may I notice from receiving this medicine? Side effects that you should report to your doctor or health care professional as soon as possible:  allergic reactions like skin rash, itching or hives, swelling of the face, lips, or tongue  breathing problems  changes in vision  fast, irregular heartbeat  high or low blood pressure  mouth sores  pain, tingling, numbness in the hands or feet  signs of decreased platelets or bleeding - bruising, pinpoint red spots on the skin, black, tarry stools, blood in the urine  signs of decreased red blood cells - unusually weak or tired, feeling faint or lightheaded, falls  signs of infection - fever or chills, cough, sore throat, pain or difficulty passing urine  signs and symptoms of liver injury like dark yellow or brown urine; general ill feeling or flu-like symptoms; light-colored stools; loss of appetite; nausea; right upper belly pain; unusually weak or tired; yellowing of the eyes or skin  swelling of the ankles, feet, hands  unusually slow heartbeat Side effects that usually do not require medical attention (report to your doctor or health care professional if they continue or are bothersome):  diarrhea  hair loss  loss  of appetite  muscle or joint pain  nausea, vomiting  pain, redness, or irritation at site where injected  tiredness This list may not describe all possible side effects. Call your doctor for medical advice about side effects. You may report side effects to FDA at 1-800-FDA-1088. Where should I keep my medicine? This drug is given in a hospital or clinic and will not be stored at home. NOTE: This sheet is a  summary. It may not cover all possible information. If you have questions about this medicine, talk to your doctor, pharmacist, or health care provider.  2020 Elsevier/Gold Standard (2017-07-14 13:14:55)  Carboplatin injection What is this medicine? CARBOPLATIN (KAR boe pla tin) is a chemotherapy drug. It targets fast dividing cells, like cancer cells, and causes these cells to die. This medicine is used to treat ovarian cancer and many other cancers. This medicine may be used for other purposes; ask your health care provider or pharmacist if you have questions. COMMON BRAND NAME(S): Paraplatin What should I tell my health care provider before I take this medicine? They need to know if you have any of these conditions:  blood disorders  hearing problems  kidney disease  recent or ongoing radiation therapy  an unusual or allergic reaction to carboplatin, cisplatin, other chemotherapy, other medicines, foods, dyes, or preservatives  pregnant or trying to get pregnant  breast-feeding How should I use this medicine? This drug is usually given as an infusion into a vein. It is administered in a hospital or clinic by a specially trained health care professional. Talk to your pediatrician regarding the use of this medicine in children. Special care may be needed. Overdosage: If you think you have taken too much of this medicine contact a poison control center or emergency room at once. NOTE: This medicine is only for you. Do not share this medicine with others. What if I miss a dose? It is important not to miss a dose. Call your doctor or health care professional if you are unable to keep an appointment. What may interact with this medicine?  medicines for seizures  medicines to increase blood counts like filgrastim, pegfilgrastim, sargramostim  some antibiotics like amikacin, gentamicin, neomycin, streptomycin, tobramycin  vaccines Talk to your doctor or health care professional  before taking any of these medicines:  acetaminophen  aspirin  ibuprofen  ketoprofen  naproxen This list may not describe all possible interactions. Give your health care provider a list of all the medicines, herbs, non-prescription drugs, or dietary supplements you use. Also tell them if you smoke, drink alcohol, or use illegal drugs. Some items may interact with your medicine. What should I watch for while using this medicine? Your condition will be monitored carefully while you are receiving this medicine. You will need important blood work done while you are taking this medicine. This drug may make you feel generally unwell. This is not uncommon, as chemotherapy can affect healthy cells as well as cancer cells. Report any side effects. Continue your course of treatment even though you feel ill unless your doctor tells you to stop. In some cases, you may be given additional medicines to help with side effects. Follow all directions for their use. Call your doctor or health care professional for advice if you get a fever, chills or sore throat, or other symptoms of a cold or flu. Do not treat yourself. This drug decreases your body's ability to fight infections. Try to avoid being around people who are sick. This medicine  may increase your risk to bruise or bleed. Call your doctor or health care professional if you notice any unusual bleeding. Be careful brushing and flossing your teeth or using a toothpick because you may get an infection or bleed more easily. If you have any dental work done, tell your dentist you are receiving this medicine. Avoid taking products that contain aspirin, acetaminophen, ibuprofen, naproxen, or ketoprofen unless instructed by your doctor. These medicines may hide a fever. Do not become pregnant while taking this medicine. Women should inform their doctor if they wish to become pregnant or think they might be pregnant. There is a potential for serious side effects  to an unborn child. Talk to your health care professional or pharmacist for more information. Do not breast-feed an infant while taking this medicine. What side effects may I notice from receiving this medicine? Side effects that you should report to your doctor or health care professional as soon as possible:  allergic reactions like skin rash, itching or hives, swelling of the face, lips, or tongue  signs of infection - fever or chills, cough, sore throat, pain or difficulty passing urine  signs of decreased platelets or bleeding - bruising, pinpoint red spots on the skin, black, tarry stools, nosebleeds  signs of decreased red blood cells - unusually weak or tired, fainting spells, lightheadedness  breathing problems  changes in hearing  changes in vision  chest pain  high blood pressure  low blood counts - This drug may decrease the number of white blood cells, red blood cells and platelets. You may be at increased risk for infections and bleeding.  nausea and vomiting  pain, swelling, redness or irritation at the injection site  pain, tingling, numbness in the hands or feet  problems with balance, talking, walking  trouble passing urine or change in the amount of urine Side effects that usually do not require medical attention (report to your doctor or health care professional if they continue or are bothersome):  hair loss  loss of appetite  metallic taste in the mouth or changes in taste This list may not describe all possible side effects. Call your doctor for medical advice about side effects. You may report side effects to FDA at 1-800-FDA-1088. Where should I keep my medicine? This drug is given in a hospital or clinic and will not be stored at home. NOTE: This sheet is a summary. It may not cover all possible information. If you have questions about this medicine, talk to your doctor, pharmacist, or health care provider.  2020 Elsevier/Gold Standard  (2008-02-15 14:38:05)

## 2019-06-21 ENCOUNTER — Telehealth: Payer: Self-pay | Admitting: *Deleted

## 2019-06-21 NOTE — Telephone Encounter (Signed)
-----   Message from Teodoro Spray, RN sent at 06/20/2019  2:48 PM EDT ----- Regarding: Dr. Alvy Bimler first time chemo followup Dr. Alvy Bimler patient first time taxol/carbo. Pt tolerated treatment well.

## 2019-06-21 NOTE — Telephone Encounter (Signed)
TCT patient to follow uip with her after her 1st dose taxol/carbo yesterday.  No answer but was able to leave vm message for her to call  [phone] with any questions or concerns regarding side effects of chemo she received.

## 2019-06-24 ENCOUNTER — Telehealth: Payer: Self-pay | Admitting: *Deleted

## 2019-06-24 ENCOUNTER — Telehealth: Payer: Self-pay | Admitting: Hematology

## 2019-06-24 NOTE — Telephone Encounter (Signed)
Received a voicemail from patient- was able to make out the phone number and it belonged to Catherine Rogers. Unable to understand concern or question due to connection. Left message for return call.

## 2019-06-24 NOTE — Telephone Encounter (Signed)
Received and left voicemail with after hours/weekend instructions for [facility] Access Nurse, or on-call provider if needed and unable to return call by 4:30 pm today.   Report to nearest ED for emergency.    "I'm trying to reach Dr. Alvy Bimler nurse.  Catherine Rogers, 11-Feb-2061 .  I'm calling because I'm in pain, and not feeling that well.  Please give me a call back.  Thank you." No further information provided.

## 2019-06-24 NOTE — Telephone Encounter (Signed)
Pt called and reports constipation and low left abdominal pain.  Abdominal pain, she recently started Percocet for her abdominal pain.  She had last bowel movement yesterday morning, it was hard.  We discussed management of constipation.  I encouraged her to take MiraLAX, and continue Colace, and try milk of magnesia if MiraLAX does not work well.  She voiced good understanding, and agrees to try.  Truitt Merle  06/24/2019

## 2019-06-27 ENCOUNTER — Telehealth: Payer: Self-pay

## 2019-06-27 ENCOUNTER — Other Ambulatory Visit: Payer: Self-pay | Admitting: Hematology and Oncology

## 2019-06-27 MED ORDER — HYDROCODONE-ACETAMINOPHEN 5-325 MG PO TABS: 1.0000 | ORAL_TABLET | Freq: Four times a day (QID) | ORAL | 0 refills | Status: DC | PRN

## 2019-06-27 NOTE — Telephone Encounter (Signed)
Pt returned call and stated she had talked with Dr Burr Medico on Friday and was instructed on how to use stool softners / laxatives.  She is now having regular BMs and no  More pain.

## 2019-06-27 NOTE — Telephone Encounter (Signed)
-----   Message from Heath Lark, MD sent at 06/27/2019 12:29 PM EDT ----- Regarding: RE: refill on hydrocodone I refilled it Can you also call and inquire about constipation? She call on call doctor over the weekend about severe constipation ----- Message ----- From: Paulla Dolly, RN Sent: 06/27/2019  11:32 AM EDT To: Heath Lark, MD Subject: refill on hydrocodone                          Send to [facility] in [place]

## 2019-06-27 NOTE — Telephone Encounter (Signed)
Thanks, we will follow

## 2019-06-27 NOTE — Telephone Encounter (Signed)
Called pt regarding her call to the on call service 7/31 evening reporting abdominal pain. LVM for pt to return call.

## 2019-06-27 NOTE — Telephone Encounter (Signed)
I spoke with her earlier today about that. She states she has been following recommended bowel regimen and having regular BM.  Currently no pain no other issues.

## 2019-06-28 MED FILL — METHADONE HCL 10 MG TABLET: 10 | 30 days supply | Qty: 60 | Fill #0

## 2019-07-08 ENCOUNTER — Other Ambulatory Visit: Payer: Self-pay

## 2019-07-08 DIAGNOSIS — Z20822 Contact with and (suspected) exposure to covid-19: Secondary | ICD-10-CM

## 2019-07-10 LAB — NOVEL CORONAVIRUS, NAA: SARS-CoV-2, NAA: NOT DETECTED

## 2019-07-11 ENCOUNTER — Ambulatory Visit: Payer: Medicare Other

## 2019-07-11 ENCOUNTER — Inpatient Hospital Stay (HOSPITAL_BASED_OUTPATIENT_CLINIC_OR_DEPARTMENT_OTHER): Payer: Medicare Other | Admitting: Hematology and Oncology

## 2019-07-11 ENCOUNTER — Inpatient Hospital Stay: Payer: Medicare Other

## 2019-07-11 ENCOUNTER — Inpatient Hospital Stay: Payer: Medicare Other | Attending: Hematology

## 2019-07-11 ENCOUNTER — Other Ambulatory Visit: Payer: Self-pay

## 2019-07-11 DIAGNOSIS — C562 Malignant neoplasm of left ovary: Secondary | ICD-10-CM | POA: Insufficient documentation

## 2019-07-11 DIAGNOSIS — D61818 Other pancytopenia: Secondary | ICD-10-CM | POA: Insufficient documentation

## 2019-07-11 DIAGNOSIS — R110 Nausea: Secondary | ICD-10-CM | POA: Diagnosis not present

## 2019-07-11 DIAGNOSIS — G893 Neoplasm related pain (acute) (chronic): Secondary | ICD-10-CM | POA: Insufficient documentation

## 2019-07-11 DIAGNOSIS — Z79899 Other long term (current) drug therapy: Secondary | ICD-10-CM | POA: Diagnosis not present

## 2019-07-11 DIAGNOSIS — G629 Polyneuropathy, unspecified: Secondary | ICD-10-CM | POA: Insufficient documentation

## 2019-07-11 DIAGNOSIS — I779 Disorder of arteries and arterioles, unspecified: Secondary | ICD-10-CM

## 2019-07-11 DIAGNOSIS — K5909 Other constipation: Secondary | ICD-10-CM

## 2019-07-11 DIAGNOSIS — Z5111 Encounter for antineoplastic chemotherapy: Secondary | ICD-10-CM | POA: Diagnosis not present

## 2019-07-11 DIAGNOSIS — R109 Unspecified abdominal pain: Secondary | ICD-10-CM | POA: Diagnosis not present

## 2019-07-11 DIAGNOSIS — I700 Atherosclerosis of aorta: Secondary | ICD-10-CM | POA: Insufficient documentation

## 2019-07-11 DIAGNOSIS — C786 Secondary malignant neoplasm of retroperitoneum and peritoneum: Secondary | ICD-10-CM | POA: Diagnosis not present

## 2019-07-11 DIAGNOSIS — C182 Malignant neoplasm of ascending colon: Secondary | ICD-10-CM | POA: Diagnosis not present

## 2019-07-11 DIAGNOSIS — G609 Hereditary and idiopathic neuropathy, unspecified: Secondary | ICD-10-CM | POA: Diagnosis not present

## 2019-07-11 LAB — CMP (CANCER CENTER ONLY)
ALT: 9 U/L (ref 0–44)
AST: 12 U/L — ABNORMAL LOW (ref 15–41)
Albumin: 3.7 g/dL (ref 3.5–5.0)
Alkaline Phosphatase: 69 U/L (ref 38–126)
Anion gap: 11 (ref 5–15)
BUN: 12 mg/dL (ref 6–20)
CO2: 24 mmol/L (ref 22–32)
Calcium: 10.1 mg/dL (ref 8.9–10.3)
Chloride: 105 mmol/L (ref 98–111)
Creatinine: 0.88 mg/dL (ref 0.44–1.00)
GFR, Est AFR Am: >60 mL/min (ref >60)
GFR, Estimated: >60 mL/min (ref >60)
Glucose, Bld: 169 mg/dL — ABNORMAL HIGH (ref 70–99)
Potassium: 4.4 mmol/L (ref 3.5–5.1)
Sodium: 140 mmol/L (ref 135–145)
Total Bilirubin: 0.3 mg/dL (ref 0.3–1.2)
Total Protein: 8.2 g/dL — ABNORMAL HIGH (ref 6.5–8.1)

## 2019-07-11 LAB — CBC WITH DIFFERENTIAL (CANCER CENTER ONLY)
Abs Immature Granulocytes: 0.01 10*3/uL (ref 0.00–0.07)
Basophils Absolute: 0 10*3/uL (ref 0.0–0.1)
Basophils Relative: 0 %
Eosinophils Absolute: 0 10*3/uL (ref 0.0–0.5)
Eosinophils Relative: 0 %
HCT: 35.1 % — ABNORMAL LOW (ref 36.0–46.0)
Hemoglobin: 12.7 g/dL (ref 12.0–15.0)
Immature Granulocytes: 0 %
Lymphocytes Relative: 25 %
Lymphs Abs: 0.8 10*3/uL (ref 0.7–4.0)
MCH: 35.5 pg — ABNORMAL HIGH (ref 26.0–34.0)
MCHC: 36.2 g/dL — ABNORMAL HIGH (ref 30.0–36.0)
MCV: 98 fL (ref 80.0–100.0)
Monocytes Absolute: 0 10*3/uL — ABNORMAL LOW (ref 0.1–1.0)
Monocytes Relative: 1 %
Neutro Abs: 2.4 10*3/uL (ref 1.7–7.7)
Neutrophils Relative %: 74 %
Platelet Count: 85 10*3/uL — ABNORMAL LOW (ref 150–400)
RBC: 3.58 MIL/uL — ABNORMAL LOW (ref 3.87–5.11)
RDW: 11.8 % (ref 11.5–15.5)
WBC Count: 3.3 10*3/uL — ABNORMAL LOW (ref 4.0–10.5)
nRBC: 0 % (ref 0.0–0.2)

## 2019-07-11 MED ORDER — DIPHENHYDRAMINE HCL 50 MG/ML IJ SOLN
INTRAMUSCULAR | Status: AC
  Filled 2019-07-11: qty 1

## 2019-07-11 MED ORDER — PALONOSETRON HCL INJECTION 0.25 MG/5ML
0.2500 mg | Freq: Once | INTRAVENOUS | Status: AC
  Administered 2019-07-11: 0.25 mg via INTRAVENOUS

## 2019-07-11 MED ORDER — SODIUM CHLORIDE 0.9 % IV SOLN
Freq: Once | INTRAVENOUS | Status: AC
  Administered 2019-07-11: 11:00:00 via INTRAVENOUS
  Filled 2019-07-11: qty 250

## 2019-07-11 MED ORDER — FAMOTIDINE IN NACL 20-0.9 MG/50ML-% IV SOLN
20.0000 mg | Freq: Once | INTRAVENOUS | Status: AC
  Administered 2019-07-11: 20 mg via INTRAVENOUS

## 2019-07-11 MED ORDER — SODIUM CHLORIDE 0.9 % IV SOLN
131.2500 mg/m2 | Freq: Once | INTRAVENOUS | Status: AC
  Administered 2019-07-11: 252 mg via INTRAVENOUS
  Filled 2019-07-11: qty 42

## 2019-07-11 MED ORDER — FAMOTIDINE IN NACL 20-0.9 MG/50ML-% IV SOLN
INTRAVENOUS | Status: AC
  Filled 2019-07-11: qty 50

## 2019-07-11 MED ORDER — SODIUM CHLORIDE 0.9 % IV SOLN
Freq: Once | INTRAVENOUS | Status: AC
  Administered 2019-07-11: 11:00:00 via INTRAVENOUS
  Filled 2019-07-11: qty 5

## 2019-07-11 MED ORDER — HYDROCODONE-ACETAMINOPHEN 5-325 MG PO TABS: 1.0000 | ORAL_TABLET | Freq: Four times a day (QID) | ORAL | 0 refills | Status: DC | PRN

## 2019-07-11 MED ORDER — DIPHENHYDRAMINE HCL 50 MG/ML IJ SOLN
50.0000 mg | Freq: Once | INTRAMUSCULAR | Status: AC
  Administered 2019-07-11: 50 mg via INTRAVENOUS

## 2019-07-11 MED ORDER — SODIUM CHLORIDE 0.9 % IV SOLN
480.0000 mg | Freq: Once | INTRAVENOUS | Status: AC
  Administered 2019-07-11: 480 mg via INTRAVENOUS
  Filled 2019-07-11: qty 48

## 2019-07-11 MED ORDER — PALONOSETRON HCL INJECTION 0.25 MG/5ML
INTRAVENOUS | Status: AC
  Filled 2019-07-11: qty 5

## 2019-07-11 NOTE — Patient Instructions (Signed)
[facility] Discharge Instructions for Patients Receiving Chemotherapy  Today you received the following chemotherapy agents Paclitaxel (TAXOL) & Carboplatin (PARAPLATIN).  To help prevent nausea and vomiting after your treatment, we encourage you to take your nausea medication as prescribed.   If you develop nausea and vomiting that is not controlled by your nausea medication, call the clinic.   BELOW ARE SYMPTOMS THAT SHOULD BE REPORTED IMMEDIATELY:  *FEVER GREATER THAN 100.5 F  *CHILLS WITH OR WITHOUT FEVER  NAUSEA AND VOMITING THAT IS NOT CONTROLLED WITH YOUR NAUSEA MEDICATION  *UNUSUAL SHORTNESS OF BREATH  *UNUSUAL BRUISING OR BLEEDING  TENDERNESS IN MOUTH AND THROAT WITH OR WITHOUT PRESENCE OF ULCERS  *URINARY PROBLEMS  *BOWEL PROBLEMS  UNUSUAL RASH Items with * indicate a potential emergency and should be followed up as soon as possible.  Feel free to call the clinic should you have any questions or concerns. The clinic phone number is [phone].  Please show the CHEMO ALERT CARD at check-in to the Emergency Department and triage nurse.  Coronavirus (COVID-19) Are you at risk?  Are you at risk for the Coronavirus (COVID-19)?  To be considered HIGH RISK for Coronavirus (COVID-19), you have to meet the following criteria:  . Traveled to [place], [place], [place], [place] or [place]; or in the [place] to [place], [place], [place], or [place]; and have fever, cough, and shortness of breath within the last 2 weeks of travel OR . Been in close contact with a person diagnosed with COVID-19 within the last 2 weeks and have fever, cough, and shortness of breath . IF YOU DO NOT MEET THESE CRITERIA, YOU ARE CONSIDERED LOW RISK FOR COVID-19.  What to do if you are HIGH RISK for COVID-19?  . If you are having a medical emergency, call 911. . Seek medical care right away. Before you go to a doctor's office, urgent care or emergency department,  call ahead and tell them about your recent travel, contact with someone diagnosed with COVID-19, and your symptoms. You should receive instructions from your physician's office regarding next steps of care.  . When you arrive at healthcare provider, tell the healthcare staff immediately you have returned from visiting [place], [place], [place], [place] or [place]; or traveled in the [place] to [place], [place], [place], or [place]; in the last two weeks or you have been in close contact with a person diagnosed with COVID-19 in the last 2 weeks.   . Tell the health care staff about your symptoms: fever, cough and shortness of breath. . After you have been seen by a medical provider, you will be either: o Tested for (COVID-19) and discharged home on quarantine except to seek medical care if symptoms worsen, and asked to  - Stay home and avoid contact with others until you get your results (4-5 days)  - Avoid travel on public transportation if possible (such as bus, train, or airplane) or o Sent to the Emergency Department by EMS for evaluation, COVID-19 testing, and possible admission depending on your condition and test results.  What to do if you are LOW RISK for COVID-19?  Reduce your risk of any infection by using the same precautions used for avoiding the common cold or flu:  . Wash your hands often with soap and warm water for at least 20 seconds.  If soap and water are not readily available, use an alcohol-based hand sanitizer with at least 60% alcohol.  .   If coughing or sneezing, cover your mouth and nose by coughing or sneezing into the elbow areas of your shirt or coat, into a tissue or into your sleeve (not your hands). . Avoid shaking hands with others and consider head nods or verbal greetings only. . Avoid touching your eyes, nose, or mouth with unwashed hands.  . Avoid close contact with people who are sick. . Avoid places or events with large numbers of people in one  location, like concerts or sporting events. . Carefully consider travel plans you have or are making. . If you are planning any travel outside or inside the [place], visit the CDC's Travelers' Health webpage for the latest health notices. . If you have some symptoms but not all symptoms, continue to monitor at home and seek medical attention if your symptoms worsen. . If you are having a medical emergency, call 911.   ADDITIONAL HEALTHCARE OPTIONS FOR PATIENTS  [facility] / e-Visit: [email or web address]         [facility]: [phone]  [facility]: [phone]                   [facility]: [phone]   

## 2019-07-12 ENCOUNTER — Encounter: Payer: Self-pay | Admitting: Hematology and Oncology

## 2019-07-12 ENCOUNTER — Telehealth: Payer: Self-pay | Admitting: Genetic Counselor

## 2019-07-12 DIAGNOSIS — D61818 Other pancytopenia: Secondary | ICD-10-CM | POA: Insufficient documentation

## 2019-07-12 DIAGNOSIS — K5909 Other constipation: Secondary | ICD-10-CM | POA: Insufficient documentation

## 2019-07-12 LAB — CA 125: Cancer Antigen (CA) 125: 438 U/mL — ABNORMAL HIGH (ref 0.0–38.1)

## 2019-07-12 NOTE — Progress Notes (Signed)
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 [facility]) Overall, she tolerated treatment poorly with nausea, constipation, persistent neuropathy and mild pancytopenia We will proceed with treatment as scheduled but I plan to reduce the dose of carboplatin a little bit and continue on aggressive supportive care She is aware that her next treatment will be delayed for a few days to allow blood count recovery along with the holidays that week I will see her back with cycle 3 of treatment and I recommend minimum 3 cycles of therapy before repeating imaging study  Pancytopenia, acquired ([place]) She is not symptomatic with pancytopenia We will proceed with treatment with dose adjustment in her next cycle will also be delayed for a few days to allow bone marrow recovery  Neuropathy, idiopathic She had pre-existing neuropathy and is on Lyrica Plan to continue on reduced dose Taxol  Cancer associated pain Her pain is better controlled We reviewed plan of care in terms of medical management I reminded her about prescription narcotic refill policy  Other constipation She had mild constipation likely exacerbated by her regular use of narcotic prescription I recommend aggressive laxative therapy with daily MiraLAX  Nausea without vomiting I suspect her nausea was exacerbated by recent constipation I recommend antiemetics and to treat her constipation aggressively   No orders of the defined types were placed in this encounter.   INTERVAL HISTORY: Please see  below for problem oriented charting. She returns for cycle 2 of treatment With recent therapy, she developed nausea and constipation She also have persistent neuropathy Overall, her pain control is stable Denies recent infection, fever or chills The patient denies any recent signs or symptoms of bleeding such as spontaneous epistaxis, hematuria or hematochezia. She continues to have intermittent abdominal pain on the left side of the abdomen  SUMMARY OF ONCOLOGIC HISTORY: Oncology History Overview Note  BRCA1 positive   Cancer of right colon ([place])  09/16/2014 Imaging   CT abd/pel:  a 3.8cm cecal mass with associated ileocecal intussusception, and a 9.1cm solid and cystic mass in the left pelvis,   09/18/2014 Initial Diagnosis   Adenocarcinoma of right colon   09/18/2014 Pathologic Stage   pT3pN1Mx, tumor extends into pericolonic soft tissue and is less than 21m from the serosal surface, LVI 8-), PNI (-), all 23 node negative, one soft tissue tumor deposit, surgical margins negative.    09/18/2014 Surgery   right hemicolectomy and terminal ileoectomy   08/06/2016 Imaging   CT chest/abd/pelvis with contrast IMPRESSION: 1. No recurrent malignancy identified. 2. 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 On the prior exam from 09/13/2015 there was back flow of contrast in the left external iliac artery to supply the left internal iliac artery ; this back flow of contrast is no longer present in the left external iliac artery is occluded. 3. Several pulmonary nodules at or below 4 mm diameter, unchanged. 4. 3 mm hypodensity posteriorly in the body of the pancreas, not well seen previously and subtle today, likely incidental, but merits observation. 5. 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 Prominent degenerative spurring of both acetabula. 6. Lumbar spondylosis and degenerative disc disease  causing prominent impingement at L4-5 and mild impingement at L5-S1.   03/08/2017 Imaging   CT CAP W Contrast 03/08/17 IMPRESSION: Chest Impression: 1. Stable small pulmonary nodules within LEFT lung. 2. No mediastinal lymphadenopathy. Abdomen / Pelvis Impression: 1. No evidence of metastatic colorectal cancer or ovarian cancer within the abdomen pelvis. 2. No lymphadenopathy. 3. Postsurgical change consistent RIGHT hemicolectomy and hysterectomy 4. A fem-fem arterial bypass noted.   11/13/2017 Imaging   CT AP w contrast IMPRESSION: 1. No findings of recurrent malignancy in the abdomen or pelvis. 2. Other imaging findings of potential clinical significance: Chronic AVN of both femoral heads without contour abnormality. Lumbar impingement at L4-5 and L5-S1. Aortic Atherosclerosis (ICD10-I70.0). 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 Partial right hemicolectomy. Hysterectomy.   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 This was discovered during her workup for colon cancer.   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 Tumor size 3.7 cm, no lymph nodes removed. T1cNx. Tumor cells are positive for cytokeratin 7 and estrogen receptor.   11/28/2014 Cancer Staging   Staging form: Ovary, AJCC 7th Edition - Clinical: Stage III (rT3, N0, M0) - Signed by Heath Lark, MD on 06/09/2019   03/08/2017 Imaging   CT CAP W Contrast  03/08/17 IMPRESSION: Chest Impression: 1. Stable small pulmonary nodules within LEFT lung. 2. No mediastinal lymphadenopathy. Abdomen / Pelvis Impression: 1. No evidence of metastatic colorectal cancer or ovarian cancer within the abdomen pelvis. 2. No lymphadenopathy. 3. Postsurgical change consistent RIGHT hemicolectomy and hysterectomy 4. A fem-fem arterial bypass noted.   03/12/2017 Mammogram   MM Right Breast 03/12/17 IMPRESSION: No mammographic or sonographic evidence of malignancy. No correlate identified for the abnormal enhancement seen within the right nipple on MRI of 02/27/2017.    04/02/2017 Pathology Results   Surgical Pathology of Nipple Biopsy: 04/02/17 Diagnosis Nipple Biopsy, Right - BENIGN NIPPLE TISSUE. - NO ATYPIA OR MALIGNANCY.    11/13/2017 Imaging   1. No findings of recurrent malignancy in the abdomen or pelvis. 2. Other imaging findings of potential clinical significance: Chronic AVN of both femoral heads without contour abnormality. Lumbar impingement at L4-5 and L5-S1. Aortic Atherosclerosis (ICD10-I70.0). 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 Partial right hemicolectomy. Hysterectomy.   03/22/2018 Mammogram   Screening mammogram IMPRESSION No evidence of malignancy.     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   06/06/2019 Imaging   1. Tiny bilateral pulmonary nodules stable since 11/12/2018. Continued close attention on follow-up recommended. 2. Incompletely visualized upper abdominal lymphadenopathy better characterized on the abdomen CT from 2 weeks ago. 3.  Aortic Atherosclerois (ICD10-170.0) 4.  Emphysema. ([id number])   06/07/2019 Pathology  Results   Soft Tissue Needle Core Biopsy, peritoneal nodule within right lower abdomen - METASTATIC CARCINOMA CONSISTENT WITH PATIENT'S CLINICAL HISTORY OF PRIMARY OVARIAN CARCINOMA. SEE NOTE Diagnosis Note Immunohistochemical stains show that the tumor cells are positive for CK7, ER and PAX 8; and negative for CK20 and CDX2. This immunostaining profile is consistent with above diagnosis.   06/07/2019 Procedure   Technically successful CT guided core needle biopsy of indeterminate peritoneal nodule within the right lower abdomen/pelvis.   06/20/2019 -  Chemotherapy   The patient had palonosetron (ALOXI) injection 0.25 mg, 0.25 mg, Intravenous,  Once, 2 of 6 cycles Administration: 0.25 mg (06/20/2019), 0.25 mg (07/11/2019) CARBOplatin (PARAPLATIN) 670 mg in sodium chloride 0.9 % 250 mL chemo infusion, 600 mg (100 % of original dose 600 mg), Intravenous,  Once, 2 of 6 cycles Dose modification:   (original dose 600 mg, Cycle 1), 600 mg (original dose 600 mg, Cycle 1), 480 mg (80 % of original dose 600 mg, Cycle 2, Reason: Dose Not Tolerated) Administration: 670 mg (06/20/2019), 480 mg (07/11/2019) PACLitaxel (TAXOL) 252 mg in sodium chloride 0.9 % 250 mL chemo infusion (> 72m/m2), 131.25 mg/m2 = 252 mg (75 % of original dose 175 mg/m2), Intravenous,  Once, 2 of 6 cycles Dose modification: 131.25 mg/m2 (75 % of original dose 175 mg/m2, Cycle 1, Reason: Provider Judgment) Administration: 252 mg (06/20/2019), 252 mg (07/11/2019) fosaprepitant (EMEND) 150 mg, dexamethasone (DECADRON) 12 mg in sodium chloride 0.9 % 145 mL IVPB, , Intravenous,  Once, 2 of 6 cycles Administration:  (06/20/2019),  (07/11/2019)  for chemotherapy treatment.    07/11/2019 Tumor Marker   Patient's tumor was tested for the following markers: CA-125 Results of the tumor marker test revealed 438     REVIEW OF SYSTEMS:   Constitutional: Denies fevers, chills or abnormal weight loss Eyes: Denies blurriness of vision Ears, nose,  mouth, throat, and face: Denies mucositis or sore throat Respiratory: Denies cough, dyspnea or wheezes Cardiovascular: Denies palpitation, chest discomfort or lower extremity swelling Skin: Denies abnormal skin rashes Lymphatics: Denies new lymphadenopathy or easy bruising Behavioral/Psych: Mood is stable, no new changes  All other systems were reviewed with the patient and are negative.  I have reviewed the past medical history, past surgical history, social history and family history with the patient and they are unchanged from previous note.  ALLERGIES:  is allergic to other; demerol [meperidine]; morphine and related; oxycodone; penicillins; tylenol [acetaminophen]; and ibuprofen.  MEDICATIONS:  Current Outpatient Medications  Medication Sig Dispense Refill  . acetaminophen (TYLENOL) 325 MG tablet Take 325 mg by mouth as needed.    .Marland Kitchendexamethasone (DECADRON) 4 MG tablet Take 3 tabs at the night before and 3 tab the morning of chemotherapy, every 3 weeks, by mouth for 6 cycles 36 tablet 9  . docusate sodium 100 MG CAPS Take 100 mg by mouth 2 (two) times daily as needed for mild constipation. 10 capsule 0  . HYDROcodone-acetaminophen (NORCO/VICODIN) 5-325 MG tablet Take 1 tablet by mouth every 6 (six) hours as needed for moderate pain. 60 tablet 0  . lidocaine (LIDODERM) 5 % PLACE 1 PATCH ONTO THE SKIN EVERY DAY.REMOVE AND DISCARD PATCH WITHIN 12 HOURS OR AS DIRECTED BY PRESCRIBER 30 patch 1  . LORazepam (ATIVAN) 0.5 MG tablet Take 1 tablet (0.5 mg total) by mouth 3 (three) times daily as needed for anxiety. 10 tablet 0  . losartan (COZAAR) 50 MG  tablet Take 1 tablet (50 mg total) by mouth daily. 90 tablet 2  . methadone (DOLOPHINE) 10 MG tablet Take 1 tablet (10 mg total) by mouth every 12 (twelve) hours. 60 tablet 0  . ondansetron (ZOFRAN) 8 MG tablet Take 1 tablet (8 mg total) by mouth every 8 (eight) hours as needed for nausea or vomiting. 30 tablet 1  . pregabalin (LYRICA) 200 MG  capsule Take 1 capsule (200 mg total) by mouth every 8 (eight) hours. Please give patient brand name Lyrica 90 capsule 5  . prochlorperazine (COMPAZINE) 10 MG tablet Take 1 tablet (10 mg total) by mouth every 6 (six) hours as needed (Nausea or vomiting). 30 tablet 1   No current facility-administered medications for this visit.     PHYSICAL EXAMINATION: ECOG PERFORMANCE STATUS: 1 - Symptomatic but completely ambulatory  Vitals:   07/11/19 0953  BP: (!) 169/91  Pulse: 91  Resp: 17  Temp: 97.8 F (36.6 C)  SpO2: 99%   Filed Weights   07/11/19 0953  Weight: 163 lb 3.2 oz (74 kg)    GENERAL:alert, no distress and comfortable SKIN: skin color, texture, turgor are normal, no rashes or significant lesions EYES: normal, Conjunctiva are pink and non-injected, sclera clear OROPHARYNX:no exudate, no erythema and lips, buccal mucosa, and tongue normal  NECK: supple, thyroid normal size, non-tender, without nodularity LYMPH:  no palpable lymphadenopathy in the cervical, axillary or inguinal LUNGS: clear to auscultation and percussion with normal breathing effort HEART: regular rate & rhythm and no murmurs and no lower extremity edema ABDOMEN:abdomen soft, mild tenderness on the left lower quadrant without rebound or guarding Musculoskeletal:no cyanosis of digits and no clubbing  NEURO: alert & oriented x 3 with fluent speech, no focal motor/sensory deficits  LABORATORY DATA:  I have reviewed the data as listed    Component Value Date/Time   NA 140 07/11/2019 0937   NA 141 10/07/2017 1436   K 4.4 07/11/2019 0937   K 4.4 10/07/2017 1436   CL 105 07/11/2019 0937   CO2 24 07/11/2019 0937   CO2 26 10/07/2017 1436   GLUCOSE 169 (H) 07/11/2019 0937   GLUCOSE 85 10/07/2017 1436   BUN 12 07/11/2019 0937   BUN 14.9 10/07/2017 1436   CREATININE 0.88 07/11/2019 0937   CREATININE 1.0 10/07/2017 1436   CALCIUM 10.1 07/11/2019 0937   CALCIUM 9.5 10/07/2017 1436   PROT 8.2 (H) 07/11/2019  0937   PROT 7.8 10/07/2017 1436   ALBUMIN 3.7 07/11/2019 0937   ALBUMIN 3.9 10/07/2017 1436   AST 12 (L) 07/11/2019 0937   AST 15 10/07/2017 1436   ALT 9 07/11/2019 0937   ALT 7 10/07/2017 1436   ALKPHOS 69 07/11/2019 0937   ALKPHOS 81 10/07/2017 1436   BILITOT 0.3 07/11/2019 0937   BILITOT 0.54 10/07/2017 1436   GFRNONAA >60 07/11/2019 0937   GFRAA >60 07/11/2019 0937    No results found for: SPEP, UPEP  Lab Results  Component Value Date   WBC 3.3 (L) 07/11/2019   NEUTROABS 2.4 07/11/2019   HGB 12.7 07/11/2019   HCT 35.1 (L) 07/11/2019   MCV 98.0 07/11/2019   PLT 85 (L) 07/11/2019      Chemistry      Component Value Date/Time   NA 140 07/11/2019 0937   NA 141 10/07/2017 1436   K 4.4 07/11/2019 0937   K 4.4 10/07/2017 1436   CL 105 07/11/2019 0937   CO2 24 07/11/2019 0937   CO2 26 10/07/2017  1436   BUN 12 07/11/2019 0937   BUN 14.9 10/07/2017 1436   CREATININE 0.88 07/11/2019 0937   CREATININE 1.0 10/07/2017 1436      Component Value Date/Time   CALCIUM 10.1 07/11/2019 0937   CALCIUM 9.5 10/07/2017 1436   ALKPHOS 69 07/11/2019 0937   ALKPHOS 81 10/07/2017 1436   AST 12 (L) 07/11/2019 0937   AST 15 10/07/2017 1436   ALT 9 07/11/2019 0937   ALT 7 10/07/2017 1436   BILITOT 0.3 07/11/2019 0937   BILITOT 0.54 10/07/2017 1436      All questions were answered. The patient knows to call the clinic with any problems, questions or concerns. No barriers to learning was detected.  I spent 30 minutes counseling the patient face to face. The total time spent in the appointment was 40 minutes and more than 50% was on counseling and review of test results  Heath Lark, MD 07/12/2019 10:00 AM

## 2019-07-12 NOTE — Assessment & Plan Note (Signed)
I suspect her nausea was exacerbated by recent constipation I recommend antiemetics and to treat her constipation aggressively

## 2019-07-12 NOTE — Assessment & Plan Note (Signed)
Overall, she tolerated treatment poorly with nausea, constipation, persistent neuropathy and mild pancytopenia We will proceed with treatment as scheduled but I plan to reduce the dose of carboplatin a little bit and continue on aggressive supportive care She is aware that her next treatment will be delayed for a few days to allow blood count recovery along with the holidays that week I will see her back with cycle 3 of treatment and I recommend minimum 3 cycles of therapy before repeating imaging study

## 2019-07-12 NOTE — Assessment & Plan Note (Signed)
She is not symptomatic with pancytopenia We will proceed with treatment with dose adjustment in her next cycle will also be delayed for a few days to allow bone marrow recovery

## 2019-07-12 NOTE — Assessment & Plan Note (Signed)
Her pain is better controlled We reviewed plan of care in terms of medical management I reminded her about prescription narcotic refill policy

## 2019-07-12 NOTE — Assessment & Plan Note (Signed)
She had pre-existing neuropathy and is on Lyrica Plan to continue on reduced dose Taxol

## 2019-07-12 NOTE — Telephone Encounter (Signed)
LM on VM that I wanted to talk with her about her upcoming appointment with genetics and nutrition.  Left CB number.

## 2019-07-12 NOTE — Assessment & Plan Note (Signed)
She had mild constipation likely exacerbated by her regular use of narcotic prescription I recommend aggressive laxative therapy with daily MiraLAX

## 2019-07-15 ENCOUNTER — Telehealth: Payer: Self-pay | Admitting: Genetic Counselor

## 2019-07-15 NOTE — Telephone Encounter (Signed)
Called patient regarding upcoming Webex appointment, left a voicemail. This will be a walk-in visit due to no communication to set this up as virtual.

## 2019-07-15 NOTE — Telephone Encounter (Signed)
Returned patient's phone call regarding upcoming webex appointment, patient is notified and e-mail has been sent.

## 2019-07-18 ENCOUNTER — Inpatient Hospital Stay: Payer: Medicare Other | Admitting: Nutrition

## 2019-07-18 ENCOUNTER — Inpatient Hospital Stay (HOSPITAL_BASED_OUTPATIENT_CLINIC_OR_DEPARTMENT_OTHER): Payer: Medicare Other | Admitting: Genetic Counselor

## 2019-07-18 DIAGNOSIS — Z1501 Genetic susceptibility to malignant neoplasm of breast: Secondary | ICD-10-CM

## 2019-07-18 DIAGNOSIS — Z1379 Encounter for other screening for genetic and chromosomal anomalies: Secondary | ICD-10-CM

## 2019-07-18 DIAGNOSIS — Z1509 Genetic susceptibility to other malignant neoplasm: Secondary | ICD-10-CM

## 2019-07-18 NOTE — Progress Notes (Addendum)
Patient requested phone nutrition consult. When I contacted patient at appointment time, she was not available. Left name and phone number for return call.  Patient returned phone call. 58 year old female diagnosed with Ovarian cancer.  She is a patient of Dr. Alvy Bimler.  PMH includes PTSD, PAD, HTN, Depression, Asthma, Arthritis, Anemia  Medications include Decadron, Ativan, Zofran, Lyrica, Compazine.  Labs include Glucose 169 and Albumin 3.7.  Ht: 5'7" Wt: 163.2 pounds UBW: 180 pounds in January BMI: 25.56  Patient reports constipation and nausea. Reports she has been taking stool softeners and Miralax. Requests assistance with Ensure. She requests emotional assistance in dealing with this diagnosis and physical issues such as dealing with losing her hair.  Nutrition Diagnosis: Food and Nutrition Related Knowledge Deficit related to Ovarian cancer and associated treatments as evidenced by no prior need for nutrition related information.  Intervention: Educated patient on strategies for increasing calories and protein in small, frequent meals and snacks. Will provide a complimentary case of ensure enlive. Refer to chaplain and social worker per patient request. Provide name and phone number for questions and concerns.  Monitoring, Evaluation, Goals: Patient will increase calories and protein to minimize weight loss.  Next Visit: Follow as needed.

## 2019-07-19 ENCOUNTER — Encounter: Payer: Self-pay | Admitting: General Practice

## 2019-07-19 ENCOUNTER — Encounter: Payer: Self-pay | Admitting: Genetic Counselor

## 2019-07-19 NOTE — Progress Notes (Addendum)
GENETIC TEST RESULTS   Patient Name: Catherine Rogers Patient Age: 58 y.o. Encounter Date: 07/18/2019  Referring Provider: Heath Lark, MD  I connected with Catherine Rogers on 07/19/2019 at 1 PM EDT by Webex video conference and verified that I am speaking with the correct person using two identifiers.   Patient location: Home Provider location: Office  Catherine. Rogers was seen in the [place] clinic on July 18, 2019 due to a personal history of cancer and concern regarding a hereditary predisposition to cancer in the family. Please refer to the prior Genetics clinic note for more information regarding Catherine. Rogers's medical and family histories and our assessment at the time.   FAMILY HISTORY:  We obtained a detailed, 4-generation family history.  Significant diagnoses are listed below: Family History  Problem Relation Age of Onset  . Brain cancer Sister 64  . Lung cancer Brother 94  . Heart attack Mother   . Heart attack Father        70s  . Heart disease Father   . Heart attack Maternal Uncle   . Diabetes Maternal Uncle   . Breast cancer Maternal Uncle   . Ovarian cancer Paternal Aunt        3 paternal aunts with ovarian cancer  . Heart attack Brother   . Diabetes Paternal Uncle     The patient ha four sons and two daughters who are all cancer free.  The patient reports that all of her children have undergone genetic testing and have tested negative.  She has six brothers and a sister.  One brother died of lung cancer at 46, and another brother died of an unknown cancer at 50.  Her sister has been diagnosed with brain cancer, and is currently in a nursing home.  Both parents are deceased.  The patient's father died at 4.  He had 12 brothers and three sisters.  It is thought that each of the sisters had ovarian cancer.  There is no information about paternal cousins.  The paternal grandparents are deceased from unknown reasons.  The patient's mother died at 42.  She has two  brothers who died from diabetes and heart disease.  Her parents are deceased from non-cancer related issues.  Patient's maternal ancestors are of African American descent, and paternal ancestors are of African American descent. There is no reported Ashkenazi Jewish ancestry. There is no known consanguinity.   GENETIC TESTING:  At the time of Catherine Rogers's visit, we recommended she pursue genetic testing of the CancerNext test. 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 There were no deleterious mutations in  APC, ATM, BARD1, BMPR1A, BRCA2, BRIP1, CDH1, CDK4, CDKN2A, CHEK2, DICER1, HOXB13, EPCAM, GREM1, MLH1, MRE11A, MSH2, MSH6, MUTYH, NBN, NF1, PALB2, PMS2, POLD1, POLE, PTEN, RAD50, RAD51C, RAD51D, SMAD4, SMARCA4, STK11, and TP53.  Genetic testing did identify two Variants of uncertain significance (VUS) - one in the ATM gene called c.1685A>G, and a second in the MSH6 gene called c.16A>C.  At this time, it is unknown if these variants are associated with increased cancer risk or if they are normal findings, but most variants such as these get reclassified to being inconsequential. They should not be used to make medical management decisions. With time, we suspect the lab will determine the significance of these variants, if any. If we do learn more about them, we will try to contact  Catherine Rogers to discuss it further. However, it is important to stay in touch with [place] periodically and keep the address and phone number up to date.     CLINICAL DESCRIPTION: The average woman's lifetime risk of developing breast cancer is 12%; her risk for developing ovarian cancer is 1.3%. Most cases of these cancers are sporadic and are not due to hereditary factors, but approximately 5%-10% of breast and ovarian cancer cases are hereditary and due to an identifiable pathogenic  variant in a disease-causing gene. HBOC accounts for the majority of hereditary breast and ovarian cancer cases in individuals with a strong family history or an early-onset diagnosis.  Individuals who have inherited a pathogenic variant have a dramatically higher risk of developing cancer, and many of these cancers can be difficult to detect and treat. It is extremely helpful to identify these high-risk individuals so that additional screening, surveillance, and interventions can be started. These efforts can result in risk-reduction and early diagnosis, which increases the chances of successful treatment and survival.  LIFETIME RISKS: Individuals with a pathogenic variant in one of these genes have an increased risk of malignancy compared to the average person. However, not everyone with such a variant will actually develop cancer. Further, the same variant can present differently, even among family members. Because we cannot predict which cancers may develop, additional medical management strategies focused on cancer prevention and early detection may benefit most patients who are found to have a pathogenic variant.  GENE              FEMALE BREAST CANCER FEMALE BREAST CANCER OVARIAN CANCER* OTHER BRCA1              Up to 87% 1%-2%                                      Up to 54%             Pancreatic  MEDICAL MANAGEMENT: Women who have a BRCA mutation have an increased risk for both breast and ovarian cancer.   As discussed with Catherine Rogers, to reduce the risk for breast cancer, prophylactic bilateral mastectomy is the most effective option for risk reduction. However, for women who choose to keep their breasts intensified screening is equally safe.  We recommend yearly mammograms, yearly breast MRI, twice-yearly clinical breast exams, and monthly self-breast exams. Catherine Rogers is referred for her mammograms by Catherine Rogers, in our high risk clinic.  She will follow up with this provider in regards to her risk  for breast cancer.   Since Catherine Rogers has already had a hysterectomy with removal of her ovaries, she has significantly reduced her risk of ovarian cancer. We therefore recommend she continue to follow healthcare management guidelines that have been provided to her by her overseeing healthcare providers.   Individuals with stage IV cancer as a result of a BRCA1 mutation may be eligible for system therapy using PARP inhibitors.  We discussed that PARP inhibitors may provide a longer progression free response in some individuals.  She should discuss the use of PARP inhibitors with her oncology provider.  RISK REDUCTION: There are several things that can be offered to individuals who are carriers for BRCA mutations that will reduce the risk for getting cancer.    In individuals who have a prophylactic bilateral salpingo-oophorectomy (BSO), the risk for breast cancer is reduced by up  to 50%.  It has been reported that short term hormone replacement therapy in women undergoing prophylactic BSO does not negate the reduction of breast cancer risk associated with surgery (1.2020 NCCN guidelines).  FAMILY MEMBERS: It is important that all of Catherine. Laster's relatives (both men and women) know of the presence of this gene mutation. Site-specific genetic testing can sort out who in the family is at risk and who is not.   Catherine. Lahm children and siblings have a 50% chance to have inherited this mutation. We recommend they have genetic testing for this same mutation, as identifying the presence of this mutation would allow them to also take advantage of risk-reducing measures.   Catherine. Doyel reports that all of her children have undergone genetic testing for the pathogenic variant in BRCA1, and reportedly are all negative.  We discussed how this is a fortunate result, as we would expect that out of six children, on average around 3 would be expected to be affected.  We reviewed dominate inheritance, and how we should  review her children's results to confirm their negative result.  Her siblings and nieces/nephews should also consider testing.  SUPPORT AND RESOURCES: If Catherine. Collinsworth is interested in BRCA-specific information and support, there are two groups, Facing Our Risk (www.facingourrisk.com) and Bright Pink (www.brightpink.org) which some people have found useful. They provide opportunities to speak with other individuals from high-risk families. To locate genetic counselors in other cities, visit the website of the [employer] (ArtistMovie.se) and Secretary/administrator for a Social worker by zip code.  We encouraged Catherine. Waas to remain in contact with [place] on an annual basis so we can update her personal and family histories, and let her know of advances in cancer genetics that may benefit the family. Our contact number was provided. Catherine. Mckissic questions were answered to her satisfaction today, and she knows she is welcome to call anytime with additional questions.   Darel Ricketts P. Florene [place], [place], [facility] Licensed, Insurance risk surveyor [email or web address] phone: [phone]  The patient was seen for a total of 55 minutes in face-to-face genetic counseling.

## 2019-07-19 NOTE — Progress Notes (Signed)
[place] Spiritual Care Note  Referred by Pamala Hurry Neff/RD per pt request. LVM encouraging callback and will phone again later this week.   [place], [place], [facility] Pager [phone] Voicemail [phone]

## 2019-07-20 ENCOUNTER — Telehealth: Payer: Self-pay | Admitting: Hematology and Oncology

## 2019-07-20 ENCOUNTER — Encounter (INDEPENDENT_AMBULATORY_CARE_PROVIDER_SITE_OTHER): Payer: Medicare Other | Admitting: Neurology

## 2019-07-20 ENCOUNTER — Ambulatory Visit (INDEPENDENT_AMBULATORY_CARE_PROVIDER_SITE_OTHER): Payer: Medicare Other | Admitting: Neurology

## 2019-07-20 ENCOUNTER — Other Ambulatory Visit: Payer: Self-pay

## 2019-07-20 ENCOUNTER — Other Ambulatory Visit: Payer: Self-pay | Admitting: Hematology and Oncology

## 2019-07-20 ENCOUNTER — Telehealth: Payer: Self-pay | Admitting: *Deleted

## 2019-07-20 DIAGNOSIS — Z0289 Encounter for other administrative examinations: Secondary | ICD-10-CM

## 2019-07-20 DIAGNOSIS — R200 Anesthesia of skin: Secondary | ICD-10-CM

## 2019-07-20 DIAGNOSIS — M79605 Pain in left leg: Secondary | ICD-10-CM

## 2019-07-20 NOTE — Procedures (Signed)
Full Name: Catherine Rogers Gender: Female MRN #: [id number] Date of Birth: 07-04-1961    Visit Date: 07/20/2019 09:22 Age: 58 Years 88 Months Old Examining Physician: Marcial Pacas, MD  Referring Physician: Marcial Pacas, MD History:    58 year old female presented with chronic left lower extremity pain, history of avascular necrosis of left hip, recurrent pelvic carcinomatosis, is receiving chemotherapy.  Summary of the tests: Nerve conduction study: Right sural, superficial peroneal sensory responses were normal.  Right peroneal to EDB and tibial motor responses were normal. Left sural, superficial peroneal sensory responses were absent.  Left peroneal to EDB motor responses were normal.  Left tibial motor responses showed moderately decreased the C map amplitude, with mildly slow conduction velocity.  Electromyography: Selected needle examinations of left lower extremity muscles, and left lumbosacral paraspinal muscles were performed.  There was mild chronic neuropathic changes at left tibialis anterior; there was evidence of active denervation at left lower lumbosacral paraspinal muscles.  Conclusion: This is an abnormal study.  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    ------------------------------- Marcial Pacas, M.D. PhD  [facility] [place], [place] [address] Tel: [phone] Fax: [phone]        [facility]    Nerve / Sites Muscle Latency Ref. Amplitude Ref. Rel Amp Segments Distance Velocity Ref. Area    ms ms mV mV %  cm m/s m/s mVms  R Peroneal - EDB     Ankle EDB 3.8 ?6.5 7.4 ?2.0 100 Ankle - EDB 9   21.4     Fib head EDB 10.0  6.3  85.5 Fib head - Ankle 29 47 ?44 21.0     Pop fossa EDB 12.2  6.2  98.6 Pop fossa - Fib head 10 45 ?44 20.0         Pop fossa - Ankle      L Peroneal - EDB     Ankle EDB 5.2  ?6.5 2.1 ?2.0 100 Ankle - EDB 9   8.8     Fib head EDB 12.1  1.2  58.1 Fib head - Ankle 29 42 ?44 5.3     Pop fossa EDB 14.5  1.4  114 Pop fossa - Fib head 10 42 ?44 5.6         Pop fossa - Ankle      R Tibial - AH     Ankle AH 3.2 ?5.8 6.2 ?4.0 100 Ankle - AH 9   15.7     Pop fossa AH 12.2  4.1  65.3 Pop fossa - Ankle 38 42 ?41 13.5  L Tibial - AH     Ankle AH 4.1 ?5.8 1.8 ?4.0 100 Ankle - AH 9   8.4     Pop fossa AH 13.5  1.4  82.5 Pop fossa - Ankle 37 39 ?41 4.7             SNC    Nerve / Sites Rec. Site Peak Lat Ref.  Amp Ref. Segments Distance    ms ms V V  cm  R Sural - Ankle (Calf)     Calf Ankle 3.2 ?4.4 17 ?6 Calf - Ankle 14  L Sural - Ankle (Calf)     Calf Ankle NR ?4.4 NR ?6 Calf - Ankle 14  R Superficial peroneal - Ankle     Lat leg Ankle 3.8 ?4.4  6 ?6 Lat leg - Ankle 14  L Superficial peroneal - Ankle     Lat leg Ankle NR ?4.4 NR ?6 Lat leg - Ankle 14              F  Wave    Nerve F Lat Ref.   ms ms  R Tibial - AH 53.8 ?56.0  L Tibial - AH 56.1 ?56.0         H Reflex    Nerve H Lat   ms   Left Right Ref.  Tibial - [place] 35.4 33.6 ?35.0         EMG       EMG Summary Table    Spontaneous MUAP Recruitment  Muscle IA Fib PSW Fasc Other Amp Dur. Poly Pattern  L. Tibialis anterior Increased None None None _______ Normal Increased Normal Reduced  L. Tibialis posterior Normal None None None _______ Normal Normal Normal Normal  L. Peroneus longus Normal None None None _______ Normal Normal Normal Normal  L. Vastus lateralis Normal None None None _______ Normal Normal Normal Normal  L. Biceps femoris (short head) Normal None None None _______ Normal Normal Normal Normal  L. Gluteus medius Normal None None None _______ Normal Normal Normal Normal  L. Lumbar paraspinals (mid) Increased 1+ 1+ None _______ Normal Normal Normal Reduced  L. Lumbar paraspinals (low) Increased 2+ 2+ None _______ Normal Normal Normal Reduced

## 2019-07-20 NOTE — Telephone Encounter (Signed)
I talk with patient regarding schedule for 9/11

## 2019-07-20 NOTE — Telephone Encounter (Signed)
[place] Work  Clinical Social Work was referred by [employer] for assessment of psychosocial needs.  Clinical Social Worker contacted patient by phone  to offer support and assess for needs.  Patient reported she is currently unable to talk and would like to follow up with CSW at a later time.      Gwinda Maine, LCSW  Clinical Social Worker [facility]

## 2019-07-28 ENCOUNTER — Telehealth: Payer: Self-pay

## 2019-07-28 NOTE — Telephone Encounter (Signed)
She called and left a message. She needs a refill on her pain medication.

## 2019-07-29 ENCOUNTER — Encounter: Payer: Self-pay | Admitting: General Practice

## 2019-07-29 ENCOUNTER — Other Ambulatory Visit: Payer: Self-pay | Admitting: Hematology and Oncology

## 2019-07-29 MED ORDER — HYDROCODONE-ACETAMINOPHEN 5-325 MG PO TABS: 1.0000 | ORAL_TABLET | Freq: Four times a day (QID) | ORAL | 0 refills | Status: DC | PRN

## 2019-07-29 NOTE — Telephone Encounter (Signed)
Called and left a message Rx sent. 

## 2019-07-29 NOTE — Progress Notes (Signed)
[place] Spiritual Care Note  Reached Catherine Rogers by phone via referral from [facility] Neff/RD per pt request. Catherine Rogers describes herself as a private person who enjoys keeping to herself and is "a survivor," having taken care of herself since she left home at 56. She also notes that counseling has been helpful for her in the past and welcomes a call from counseling intern Catherine Rogers to establish a 1:1 support relationship. She reports historical diagnoses of anxiety, depression, and PTSD.  Catherine Rogers regularly utilizes several coping tools, including journaling, crochet, and distraction via cleaning or playing on her phone. She is currently processing the emotional impact of recurrence and hair loss due to tx. She knows to contact Support Team as needed and awaits call from counselor.   [place], [place], [facility] Pager [phone] Voicemail [phone]

## 2019-07-29 NOTE — Telephone Encounter (Signed)
done

## 2019-08-04 MED FILL — METHADONE HCL 10 MG TABLET: 10 | 30 days supply | Qty: 60 | Fill #0

## 2019-08-05 ENCOUNTER — Inpatient Hospital Stay (HOSPITAL_BASED_OUTPATIENT_CLINIC_OR_DEPARTMENT_OTHER): Payer: Medicare Other | Admitting: Hematology and Oncology

## 2019-08-05 ENCOUNTER — Inpatient Hospital Stay: Payer: Medicare Other

## 2019-08-05 ENCOUNTER — Other Ambulatory Visit: Payer: Self-pay | Admitting: Hematology and Oncology

## 2019-08-05 ENCOUNTER — Encounter: Payer: Self-pay | Admitting: Hematology and Oncology

## 2019-08-05 ENCOUNTER — Other Ambulatory Visit: Payer: Self-pay

## 2019-08-05 ENCOUNTER — Inpatient Hospital Stay: Payer: Medicare Other | Attending: Hematology

## 2019-08-05 VITALS — BP 135/82 | HR 85 | Temp 98.0°F | Resp 18 | Ht 67.0 in | Wt 165.4 lb

## 2019-08-05 DIAGNOSIS — D61818 Other pancytopenia: Secondary | ICD-10-CM | POA: Insufficient documentation

## 2019-08-05 DIAGNOSIS — G893 Neoplasm related pain (acute) (chronic): Secondary | ICD-10-CM | POA: Insufficient documentation

## 2019-08-05 DIAGNOSIS — Z5111 Encounter for antineoplastic chemotherapy: Secondary | ICD-10-CM | POA: Insufficient documentation

## 2019-08-05 DIAGNOSIS — G609 Hereditary and idiopathic neuropathy, unspecified: Secondary | ICD-10-CM

## 2019-08-05 DIAGNOSIS — Z79899 Other long term (current) drug therapy: Secondary | ICD-10-CM | POA: Insufficient documentation

## 2019-08-05 DIAGNOSIS — C562 Malignant neoplasm of left ovary: Secondary | ICD-10-CM

## 2019-08-05 DIAGNOSIS — C786 Secondary malignant neoplasm of retroperitoneum and peritoneum: Secondary | ICD-10-CM | POA: Diagnosis not present

## 2019-08-05 DIAGNOSIS — J439 Emphysema, unspecified: Secondary | ICD-10-CM | POA: Insufficient documentation

## 2019-08-05 DIAGNOSIS — I779 Disorder of arteries and arterioles, unspecified: Secondary | ICD-10-CM

## 2019-08-05 DIAGNOSIS — C182 Malignant neoplasm of ascending colon: Secondary | ICD-10-CM | POA: Diagnosis not present

## 2019-08-05 LAB — CMP (CANCER CENTER ONLY)
ALT: 10 U/L (ref 0–44)
AST: 15 U/L (ref 15–41)
Albumin: 3.8 g/dL (ref 3.5–5.0)
Alkaline Phosphatase: 69 U/L (ref 38–126)
Anion gap: 9 (ref 5–15)
BUN: 17 mg/dL (ref 6–20)
CO2: 23 mmol/L (ref 22–32)
Calcium: 9.2 mg/dL (ref 8.9–10.3)
Chloride: 109 mmol/L (ref 98–111)
Creatinine: 0.84 mg/dL (ref 0.44–1.00)
GFR, Est AFR Am: >60 mL/min (ref >60)
GFR, Estimated: >60 mL/min (ref >60)
Glucose, Bld: 139 mg/dL — ABNORMAL HIGH (ref 70–99)
Potassium: 4.1 mmol/L (ref 3.5–5.1)
Sodium: 141 mmol/L (ref 135–145)
Total Bilirubin: 0.3 mg/dL (ref 0.3–1.2)
Total Protein: 7.1 g/dL (ref 6.5–8.1)

## 2019-08-05 LAB — CBC WITH DIFFERENTIAL (CANCER CENTER ONLY)
Abs Immature Granulocytes: 0.01 10*3/uL (ref 0.00–0.07)
Basophils Absolute: 0 10*3/uL (ref 0.0–0.1)
Basophils Relative: 0 %
Eosinophils Absolute: 0 10*3/uL (ref 0.0–0.5)
Eosinophils Relative: 0 %
HCT: 28.1 % — ABNORMAL LOW (ref 36.0–46.0)
Hemoglobin: 10.1 g/dL — ABNORMAL LOW (ref 12.0–15.0)
Immature Granulocytes: 0 %
Lymphocytes Relative: 20 %
Lymphs Abs: 0.9 10*3/uL (ref 0.7–4.0)
MCH: 35.8 pg — ABNORMAL HIGH (ref 26.0–34.0)
MCHC: 35.9 g/dL (ref 30.0–36.0)
MCV: 99.6 fL (ref 80.0–100.0)
Monocytes Absolute: 0.1 10*3/uL (ref 0.1–1.0)
Monocytes Relative: 1 %
Neutro Abs: 3.4 10*3/uL (ref 1.7–7.7)
Neutrophils Relative %: 79 %
Platelet Count: 102 10*3/uL — ABNORMAL LOW (ref 150–400)
RBC: 2.82 MIL/uL — ABNORMAL LOW (ref 3.87–5.11)
RDW: 14.4 % (ref 11.5–15.5)
WBC Count: 4.3 10*3/uL (ref 4.0–10.5)
nRBC: 0 % (ref 0.0–0.2)

## 2019-08-05 MED ORDER — FAMOTIDINE IN NACL 20-0.9 MG/50ML-% IV SOLN
INTRAVENOUS | Status: AC
  Filled 2019-08-05: qty 50

## 2019-08-05 MED ORDER — DIPHENHYDRAMINE HCL 50 MG/ML IJ SOLN
50.0000 mg | Freq: Once | INTRAMUSCULAR | Status: AC
  Administered 2019-08-05: 50 mg via INTRAVENOUS

## 2019-08-05 MED ORDER — DIPHENHYDRAMINE HCL 50 MG/ML IJ SOLN
INTRAMUSCULAR | Status: AC
  Filled 2019-08-05: qty 1

## 2019-08-05 MED ORDER — SODIUM CHLORIDE 0.9 % IV SOLN
Freq: Once | INTRAVENOUS | Status: AC
  Administered 2019-08-05: 12:00:00 via INTRAVENOUS
  Filled 2019-08-05: qty 250

## 2019-08-05 MED ORDER — SODIUM CHLORIDE 0.9 % IV SOLN
131.2500 mg/m2 | Freq: Once | INTRAVENOUS | Status: AC
  Administered 2019-08-05: 246 mg via INTRAVENOUS
  Filled 2019-08-05: qty 41

## 2019-08-05 MED ORDER — SODIUM CHLORIDE 0.9 % IV SOLN
Freq: Once | INTRAVENOUS | Status: AC
  Administered 2019-08-05: 13:00:00 via INTRAVENOUS
  Filled 2019-08-05: qty 5

## 2019-08-05 MED ORDER — PALONOSETRON HCL INJECTION 0.25 MG/5ML
0.2500 mg | Freq: Once | INTRAVENOUS | Status: AC
  Administered 2019-08-05: 0.25 mg via INTRAVENOUS

## 2019-08-05 MED ORDER — PALONOSETRON HCL INJECTION 0.25 MG/5ML
INTRAVENOUS | Status: AC
  Filled 2019-08-05: qty 5

## 2019-08-05 MED ORDER — SODIUM CHLORIDE 0.9 % IV SOLN
532.0000 mg | Freq: Once | INTRAVENOUS | Status: AC
  Administered 2019-08-05: 530 mg via INTRAVENOUS
  Filled 2019-08-05: qty 53

## 2019-08-05 MED ORDER — FAMOTIDINE IN NACL 20-0.9 MG/50ML-% IV SOLN
20.0000 mg | Freq: Once | INTRAVENOUS | Status: AC
  Administered 2019-08-05: 20 mg via INTRAVENOUS

## 2019-08-05 NOTE — Progress Notes (Signed)
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 [facility]) Despite delaying this treatment, she still have significant pancytopenia I plan to reduce the dose of carboplatin further I plan to order repeat CT imaging in 3 weeks for objective assessment of response to therapy  Pancytopenia, acquired [facility]) She has significant pancytopenia despite dose adjustment We will proceed with treatment but with further dose adjustment of carboplatin  Neuropathy, idiopathic Her neuropathic pain is stable with current prescription pain medicine She denies progression of neuropathy while on treatment  Cancer associated pain I just refill her prescription recently She is advised to call me for future refill We discussed narcotic refill policy    Orders Placed This Encounter  Procedures  . CT CHEST W CONTRAST    Standing Status:   Future    Standing Expiration Date:   08/04/2020    Order Specific Question:   If indicated for the ordered procedure, I authorize the administration of contrast media per Radiology protocol    Answer:   Yes    Order Specific Question:   Preferred imaging location?    Answer:   [facility]    Order Specific Question:   Radiology Contrast Protocol - do NOT remove file path    Answer:   [email or web address]    Order Specific Question:   Is patient pregnant?    Answer:   No  . CT ABDOMEN PELVIS W CONTRAST    Standing Status:   Future    Standing Expiration Date:   08/04/2020    Order Specific Question:   If  indicated for the ordered procedure, I authorize the administration of contrast media per Radiology protocol    Answer:   Yes    Order Specific Question:   Preferred imaging location?    Answer:   [facility]    Order Specific Question:   Radiology Contrast Protocol - do NOT remove file path    Answer:   \\[email or web address]    Order Specific Question:   Is patient pregnant?    Answer:   No    INTERVAL HISTORY: Please see below for problem oriented charting. She returns for further follow-up She denies worsening neuropathy while on treatment Denies recent nausea or constipation Appetite is stable No recent fever or chills Her chronic pelvic pain is stable The patient denies any recent signs or symptoms of bleeding such as spontaneous epistaxis, hematuria or hematochezia.   SUMMARY OF ONCOLOGIC HISTORY: Oncology History Overview Note  BRCA1 positive   Cancer of right colon ([place])  09/16/2014 Imaging   CT abd/pel:  a 3.8cm cecal mass with associated ileocecal intussusception, and a 9.1cm solid and cystic mass in the left pelvis,   09/18/2014 Initial Diagnosis   Adenocarcinoma of right colon   09/18/2014 Pathologic Stage   pT3pN1Mx, tumor extends into pericolonic soft tissue and is less than 23m from the serosal surface, LVI 8-), PNI (-), all 23 node negative, one soft tissue tumor deposit, surgical margins negative.    09/18/2014 Surgery   right hemicolectomy and terminal ileoectomy   08/06/2016 Imaging   CT chest/abd/pelvis with contrast IMPRESSION: 1. No  recurrent malignancy identified. 2. 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 On the prior exam from 09/13/2015 there was back flow of contrast in the left external iliac artery to supply the left internal iliac artery ; this back flow of contrast is no longer present in the left external iliac artery is  occluded. 3. Several pulmonary nodules at or below 4 mm diameter, unchanged. 4. 3 mm hypodensity posteriorly in the body of the pancreas, not well seen previously and subtle today, likely incidental, but merits observation. 5. 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 Prominent degenerative spurring of both acetabula. 6. Lumbar spondylosis and degenerative disc disease causing prominent impingement at L4-5 and mild impingement at L5-S1.   03/08/2017 Imaging   CT CAP W Contrast 03/08/17 IMPRESSION: Chest Impression: 1. Stable small pulmonary nodules within LEFT lung. 2. No mediastinal lymphadenopathy. Abdomen / Pelvis Impression: 1. No evidence of metastatic colorectal cancer or ovarian cancer within the abdomen pelvis. 2. No lymphadenopathy. 3. Postsurgical change consistent RIGHT hemicolectomy and hysterectomy 4. A fem-fem arterial bypass noted.   11/13/2017 Imaging   CT AP w contrast IMPRESSION: 1. No findings of recurrent malignancy in the abdomen or pelvis. 2. Other imaging findings of potential clinical significance: Chronic AVN of both femoral heads without contour abnormality. Lumbar impingement at L4-5 and L5-S1. Aortic Atherosclerosis (ICD10-I70.0). 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 Partial right hemicolectomy. Hysterectomy.   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 [facility])  06/16/2019 Tumor Marker   Patient's tumor was tested for the following markers: CA-125 Results of the tumor marker test revealed 499   10/09/2014 Imaging   CT of the abdomen and pelvis showed a 9.1 cm solid and cystic mass in the left pelvis concerning for malignancy. This was discovered during her workup for colon cancer.   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 Tumor size 3.7 cm, no lymph nodes removed. T1cNx. Tumor cells are positive for cytokeratin 7 and estrogen receptor.   11/28/2014 Cancer Staging   Staging form: Ovary, AJCC 7th Edition - Clinical: Stage III (rT3, N0, M0) - Signed by Heath Lark, MD on 06/09/2019   03/08/2017 Imaging   CT CAP W Contrast 03/08/17 IMPRESSION: Chest Impression: 1. Stable small pulmonary nodules within LEFT lung. 2. No mediastinal lymphadenopathy. Abdomen / Pelvis Impression: 1. No evidence of metastatic colorectal cancer or ovarian cancer within the abdomen pelvis. 2. No lymphadenopathy. 3. Postsurgical change consistent RIGHT hemicolectomy and hysterectomy 4. A fem-fem arterial bypass noted.   03/12/2017 Mammogram   MM Right Breast 03/12/17 IMPRESSION: No mammographic or sonographic evidence of malignancy. No correlate identified for the abnormal enhancement seen within the right nipple on MRI of 02/27/2017.    04/02/2017 Pathology Results   Surgical Pathology of Nipple Biopsy: 04/02/17 Diagnosis Nipple Biopsy, Right - BENIGN NIPPLE TISSUE. - NO ATYPIA OR MALIGNANCY.    11/13/2017 Imaging   1. No findings of recurrent malignancy in the abdomen or pelvis. 2. Other imaging findings of potential clinical significance: Chronic AVN of both femoral heads without contour abnormality. Lumbar impingement at L4-5 and L5-S1. Aortic Atherosclerosis (ICD10-I70.0). 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 Partial right hemicolectomy. Hysterectomy.   03/22/2018 Mammogram   Screening mammogram IMPRESSION No evidence of malignancy.     05/02/2019 Tumor Marker   Patient's tumor was tested for the following markers: CA-125 Results of the tumor marker test revealed 360   05/24/2019 Imaging   CT abdomen and pelvis New peritoneal carcinomatosis in the  pelvis and right lower quadrant.   06/06/2019 Imaging   1. Tiny bilateral pulmonary nodules stable since 11/12/2018. Continued close attention on follow-up recommended. 2. Incompletely visualized upper abdominal lymphadenopathy better characterized on the abdomen CT from 2 weeks ago. 3.  Aortic Atherosclerois (ICD10-170.0) 4.  Emphysema. (JME26-S34.9)   06/07/2019 Pathology Results   Soft Tissue Needle Core Biopsy, peritoneal nodule within right lower abdomen - METASTATIC CARCINOMA CONSISTENT WITH PATIENT'S CLINICAL HISTORY OF PRIMARY OVARIAN CARCINOMA. SEE NOTE Diagnosis Note Immunohistochemical stains show that the tumor cells are positive for CK7, ER and PAX 8; and negative for CK20 and CDX2. This immunostaining profile is consistent with above diagnosis.   06/07/2019 Procedure   Technically successful CT guided core needle biopsy of indeterminate peritoneal nodule within the right lower abdomen/pelvis.   06/20/2019 -  Chemotherapy   The patient had palonosetron (ALOXI) injection 0.25 mg, 0.25 mg, Intravenous,  Once, 3 of 6 cycles Administration: 0.25 mg (06/20/2019), 0.25 mg (07/11/2019), 0.25 mg (08/05/2019) CARBOplatin (PARAPLATIN) 670 mg in sodium chloride 0.9 % 250 mL chemo infusion, 600 mg (100 % of original dose 600 mg), Intravenous,  Once, 3 of 6 cycles Dose modification:   (original dose 600 mg, Cycle 1), 600 mg (original dose 600 mg, Cycle 1), 480 mg (80 % of original dose 600 mg, Cycle 2, Reason: Dose Not Tolerated),   (original dose 600 mg, Cycle 3, Reason: Dose not tolerated), 532 mg (original dose 600 mg, Cycle 3), 551.5 mg (original dose 600 mg, Cycle 4, Reason: Dose not tolerated) Administration: 670 mg (06/20/2019), 480 mg (07/11/2019) PACLitaxel (TAXOL) 252 mg in sodium chloride 0.9 % 250 mL chemo infusion (> 36m/m2), 131.25 mg/m2 = 252 mg (75 % of original dose 175 mg/m2), Intravenous,  Once, 3 of 6 cycles Dose modification: 131.25 mg/m2 (75 % of original dose 175 mg/m2, Cycle  1, Reason: Provider Judgment) Administration: 252 mg (06/20/2019), 252 mg (07/11/2019) fosaprepitant (EMEND) 150 mg, dexamethasone (DECADRON) 12 mg in sodium chloride 0.9 % 145 mL IVPB, , Intravenous,  Once, 3 of 6 cycles Administration:  (06/20/2019),  (07/11/2019),  (08/05/2019)  for chemotherapy treatment.    07/11/2019 Tumor Marker   Patient's tumor was tested for the following markers: CA-125 Results of the tumor marker test revealed 438     REVIEW OF SYSTEMS:   Constitutional: Denies fevers, chills or abnormal weight loss Eyes: Denies blurriness of vision Ears, nose, mouth, throat, and face: Denies mucositis or sore throat Respiratory: Denies cough, dyspnea or wheezes Cardiovascular: Denies palpitation, chest discomfort or lower extremity swelling Gastrointestinal:  Denies nausea, heartburn or change in bowel habits Skin: Denies abnormal skin rashes Lymphatics: Denies new lymphadenopathy or easy bruising Neurological:Denies numbness, tingling or new weaknesses Behavioral/Psych: Mood is stable, no new changes  All other systems were reviewed with the patient and are negative.  I have reviewed the past medical history, past surgical history, social history and family history with the patient and they are unchanged from previous note.  ALLERGIES:  is allergic to other; demerol [meperidine]; morphine and related; oxycodone; penicillins; tylenol [acetaminophen]; and ibuprofen.  MEDICATIONS:  Current Outpatient  Medications  Medication Sig Dispense Refill  . acetaminophen (TYLENOL) 325 MG tablet Take 325 mg by mouth as needed.    [name] dexamethasone (DECADRON) 4 MG tablet Take 3 tabs at the night before and 3 tab the morning of chemotherapy, every 3 weeks, by mouth for 6 cycles 36 tablet 9  . docusate sodium 100 MG CAPS Take 100 mg by mouth 2 (two) times daily as needed for mild constipation. 10 capsule 0  . HYDROcodone-acetaminophen (NORCO/VICODIN) 5-325 MG tablet Take 1 tablet by mouth  every 6 (six) hours as needed for moderate pain. 60 tablet 0  . lidocaine (LIDODERM) 5 % PLACE 1 PATCH ONTO THE SKIN EVERY DAY.REMOVE AND DISCARD PATCH WITHIN 12 HOURS OR AS DIRECTED BY PRESCRIBER 30 patch 1  . LORazepam (ATIVAN) 0.5 MG tablet Take 1 tablet (0.5 mg total) by mouth 3 (three) times daily as needed for anxiety. 10 tablet 0  . losartan (COZAAR) 50 MG tablet Take 1 tablet (50 mg total) by mouth daily. 90 tablet 2  . methadone (DOLOPHINE) 10 MG tablet Take 1 tablet (10 mg total) by mouth every 12 (twelve) hours. 60 tablet 0  . ondansetron (ZOFRAN) 8 MG tablet Take 1 tablet (8 mg total) by mouth every 8 (eight) hours as needed for nausea or vomiting. 30 tablet 1  . pregabalin (LYRICA) 200 MG capsule Take 1 capsule (200 mg total) by mouth every 8 (eight) hours. Please give patient brand name Lyrica 90 capsule 5  . prochlorperazine (COMPAZINE) 10 MG tablet Take 1 tablet (10 mg total) by mouth every 6 (six) hours as needed (Nausea or vomiting). 30 tablet 1   No current facility-administered medications for this visit.    Facility-Administered Medications Ordered in Other Visits  Medication Dose Route Frequency Provider Last Rate Last Dose  . CARBOplatin (PARAPLATIN) 530 mg in sodium chloride 0.9 % 250 mL chemo infusion  530 mg Intravenous Once Alvy Bimler, Zhaire Locker, MD      . PACLitaxel (TAXOL) 246 mg in sodium chloride 0.9 % 250 mL chemo infusion (> 89m/m2)  131.25 mg/m2 (Treatment Plan Recorded) Intravenous Once GHeath Lark MD 97 mL/hr at 08/05/19 1328 246 mg at 08/05/19 1328    PHYSICAL EXAMINATION: ECOG PERFORMANCE STATUS: 1 - Symptomatic but completely ambulatory  Vitals:   08/05/19 1131  BP: 135/82  Pulse: 85  Resp: 18  Temp: 98 F (36.7 C)  SpO2: 99%   Filed Weights   08/05/19 1131  Weight: 165 lb 6.4 oz (75 kg)    GENERAL:alert, no distress and comfortable SKIN: skin color, texture, turgor are normal, no rashes or significant lesions EYES: normal, Conjunctiva are pink and  non-injected, sclera clear OROPHARYNX:no exudate, no erythema and lips, buccal mucosa, and tongue normal  NECK: supple, thyroid normal size, non-tender, without nodularity LYMPH:  no palpable lymphadenopathy in the cervical, axillary or inguinal LUNGS: clear to auscultation and percussion with normal breathing effort HEART: regular rate & rhythm and no murmurs and no lower extremity edema ABDOMEN:abdomen soft, non-tender and normal bowel sounds Musculoskeletal:no cyanosis of digits and no clubbing  NEURO: alert & oriented x 3 with fluent speech, no focal motor/sensory deficits  LABORATORY DATA:  I have reviewed the data as listed    Component Value Date/Time   NA 141 08/05/2019 1043   NA 141 10/07/2017 1436   K 4.1 08/05/2019 1043   K 4.4 10/07/2017 1436   CL 109 08/05/2019 1043   CO2 23 08/05/2019 1043   CO2 26 10/07/2017 1436  GLUCOSE 139 (H) 08/05/2019 1043   GLUCOSE 85 10/07/2017 1436   BUN 17 08/05/2019 1043   BUN 14.9 10/07/2017 1436   CREATININE 0.84 08/05/2019 1043   CREATININE 1.0 10/07/2017 1436   CALCIUM 9.2 08/05/2019 1043   CALCIUM 9.5 10/07/2017 1436   PROT 7.1 08/05/2019 1043   PROT 7.8 10/07/2017 1436   ALBUMIN 3.8 08/05/2019 1043   ALBUMIN 3.9 10/07/2017 1436   AST 15 08/05/2019 1043   AST 15 10/07/2017 1436   ALT 10 08/05/2019 1043   ALT 7 10/07/2017 1436   ALKPHOS 69 08/05/2019 1043   ALKPHOS 81 10/07/2017 1436   BILITOT 0.3 08/05/2019 1043   BILITOT 0.54 10/07/2017 1436   GFRNONAA >60 08/05/2019 1043   GFRAA >60 08/05/2019 1043    No results found for: SPEP, UPEP  Lab Results  Component Value Date   WBC 4.3 08/05/2019   NEUTROABS 3.4 08/05/2019   HGB 10.1 (L) 08/05/2019   HCT 28.1 (L) 08/05/2019   MCV 99.6 08/05/2019   PLT 102 (L) 08/05/2019      Chemistry      Component Value Date/Time   NA 141 08/05/2019 1043   NA 141 10/07/2017 1436   K 4.1 08/05/2019 1043   K 4.4 10/07/2017 1436   CL 109 08/05/2019 1043   CO2 23 08/05/2019 1043    CO2 26 10/07/2017 1436   BUN 17 08/05/2019 1043   BUN 14.9 10/07/2017 1436   CREATININE 0.84 08/05/2019 1043   CREATININE 1.0 10/07/2017 1436      Component Value Date/Time   CALCIUM 9.2 08/05/2019 1043   CALCIUM 9.5 10/07/2017 1436   ALKPHOS 69 08/05/2019 1043   ALKPHOS 81 10/07/2017 1436   AST 15 08/05/2019 1043   AST 15 10/07/2017 1436   ALT 10 08/05/2019 1043   ALT 7 10/07/2017 1436   BILITOT 0.3 08/05/2019 1043   BILITOT 0.54 10/07/2017 1436       All questions were answered. The patient knows to call the clinic with any problems, questions or concerns. No barriers to learning was detected.  I spent 25 minutes counseling the patient face to face. The total time spent in the appointment was 30 minutes and more than 50% was on counseling and review of test results  Heath Lark, MD 08/05/2019 1:30 PM

## 2019-08-05 NOTE — Assessment & Plan Note (Signed)
She has significant pancytopenia despite dose adjustment We will proceed with treatment but with further dose adjustment of carboplatin

## 2019-08-05 NOTE — Assessment & Plan Note (Addendum)
Her neuropathic pain is stable with current prescription pain medicine She denies progression of neuropathy while on treatment

## 2019-08-05 NOTE — Patient Instructions (Signed)
Cancer Center Discharge Instructions for Patients Receiving Chemotherapy  Today you received the following chemotherapy agents Paclitaxel (TAXOL) & Carboplatin (PARAPLATIN).  To help prevent nausea and vomiting after your treatment, we encourage you to take your nausea medication as prescribed.   If you develop nausea and vomiting that is not controlled by your nausea medication, call the clinic.   BELOW ARE SYMPTOMS THAT SHOULD BE REPORTED IMMEDIATELY:  *FEVER GREATER THAN 100.5 F  *CHILLS WITH OR WITHOUT FEVER  NAUSEA AND VOMITING THAT IS NOT CONTROLLED WITH YOUR NAUSEA MEDICATION  *UNUSUAL SHORTNESS OF BREATH  *UNUSUAL BRUISING OR BLEEDING  TENDERNESS IN MOUTH AND THROAT WITH OR WITHOUT PRESENCE OF ULCERS  *URINARY PROBLEMS  *BOWEL PROBLEMS  UNUSUAL RASH Items with * indicate a potential emergency and should be followed up as soon as possible.  Feel free to call the clinic should you have any questions or concerns. The clinic phone number is [phone].  Please show the CHEMO ALERT CARD at check-in to the Emergency Department and triage nurse.  Coronavirus (COVID-19) Are you at risk?  Are you at risk for the Coronavirus (COVID-19)?  To be considered HIGH RISK for Coronavirus (COVID-19), you have to meet the following criteria:  . Traveled to [place], [place], [place], [place] or [place]; or in the [place] to [place], [place], [place], or [place]; and have fever, cough, and shortness of breath within the last 2 weeks of travel OR . Been in close contact with a person diagnosed with COVID-19 within the last 2 weeks and have fever, cough, and shortness of breath . IF YOU DO NOT MEET THESE CRITERIA, YOU ARE CONSIDERED LOW RISK FOR COVID-19.  What to do if you are HIGH RISK for COVID-19?  . If you are having a medical emergency, call 911. . Seek medical care right away. Before you go to a doctor's office, urgent care or emergency department,  call ahead and tell them about your recent travel, contact with someone diagnosed with COVID-19, and your symptoms. You should receive instructions from your physician's office regarding next steps of care.  . When you arrive at healthcare provider, tell the healthcare staff immediately you have returned from visiting [place], [place], [place], [place] or [place]; or traveled in the [place] to [place], [place], [place], or [place]; in the last two weeks or you have been in close contact with a person diagnosed with COVID-19 in the last 2 weeks.   . Tell the health care staff about your symptoms: fever, cough and shortness of breath. . After you have been seen by a medical provider, you will be either: o Tested for (COVID-19) and discharged home on quarantine except to seek medical care if symptoms worsen, and asked to  - Stay home and avoid contact with others until you get your results (4-5 days)  - Avoid travel on public transportation if possible (such as bus, train, or airplane) or o Sent to the Emergency Department by EMS for evaluation, COVID-19 testing, and possible admission depending on your condition and test results.  What to do if you are LOW RISK for COVID-19?  Reduce your risk of any infection by using the same precautions used for avoiding the common cold or flu:  . Wash your hands often with soap and warm water for at least 20 seconds.  If soap and water are not readily available, use an alcohol-based hand sanitizer with at least 60% alcohol.  .   If coughing or sneezing, cover your mouth and nose by coughing or sneezing into the elbow areas of your shirt or coat, into a tissue or into your sleeve (not your hands). . Avoid shaking hands with others and consider head nods or verbal greetings only. . Avoid touching your eyes, nose, or mouth with unwashed hands.  . Avoid close contact with people who are sick. . Avoid places or events with large numbers of people in one  location, like concerts or sporting events. . Carefully consider travel plans you have or are making. . If you are planning any travel outside or inside the [place], visit the CDC's Travelers' Health webpage for the latest health notices. . If you have some symptoms but not all symptoms, continue to monitor at home and seek medical attention if your symptoms worsen. . If you are having a medical emergency, call 911.   ADDITIONAL HEALTHCARE OPTIONS FOR PATIENTS  [facility] / e-Visit: [email or web address]         [facility]: [phone]  [facility]: [phone]                   [facility]: [phone]   

## 2019-08-05 NOTE — Assessment & Plan Note (Signed)
Despite delaying this treatment, she still have significant pancytopenia I plan to reduce the dose of carboplatin further I plan to order repeat CT imaging in 3 weeks for objective assessment of response to therapy

## 2019-08-05 NOTE — Assessment & Plan Note (Signed)
I just refill her prescription recently She is advised to call me for future refill We discussed narcotic refill policy

## 2019-08-06 LAB — CA 125: Cancer Antigen (CA) 125: 272 U/mL — ABNORMAL HIGH (ref 0.0–38.1)

## 2019-08-08 ENCOUNTER — Telehealth: Payer: Self-pay | Admitting: *Deleted

## 2019-08-08 ENCOUNTER — Encounter: Payer: Self-pay | Admitting: Nutrition

## 2019-08-08 NOTE — Telephone Encounter (Signed)
Telephone call to patient and advised lab results as directed below. Patient verbalized an understanding. She has scheduled her ct scan for October 1.

## 2019-08-08 NOTE — Progress Notes (Signed)
Patient was provided 2nd complimentary case of ensure enlive on Friday, Sept 11.

## 2019-08-08 NOTE — Telephone Encounter (Signed)
-----   Message from Heath Lark, MD sent at 08/08/2019  8:32 AM EDT ----- Regarding: CA-125 is better pls let her know and remind her to call central scheduling to schedule her CT

## 2019-08-09 ENCOUNTER — Telehealth: Payer: Self-pay | Admitting: Hematology and Oncology

## 2019-08-09 NOTE — Telephone Encounter (Signed)
Called and spoke with patient. Confirmed appt  °

## 2019-08-12 ENCOUNTER — Telehealth: Payer: Self-pay | Admitting: *Deleted

## 2019-08-12 ENCOUNTER — Other Ambulatory Visit: Payer: Self-pay | Admitting: Hematology and Oncology

## 2019-08-12 MED ORDER — HYDROCODONE-ACETAMINOPHEN 5-325 MG PO TABS: 1.0000 | ORAL_TABLET | Freq: Four times a day (QID) | ORAL | 0 refills | Status: DC | PRN

## 2019-08-12 NOTE — Telephone Encounter (Signed)
sent 

## 2019-08-12 NOTE — Progress Notes (Signed)
I spoke to the patient via phone today after a referral from Lorrin Jackson to offer counseling support.  The patient agreed that counseling could be beneficial for her.  The patient reports a history of anxiety, depression and PTSD.  Current distress is related to her cancer recurrence and her change in appearance.  We scheduled a video counseling appointment for 08/16/2019 at 1:00pm.  I mailed the patient the required paperwork for her signatures along with stamped and pre-addressed envelope.   [facility] Voicemail:  [phone]

## 2019-08-12 NOTE — Telephone Encounter (Signed)
Patient called for a refill of Hydrocodone to be sent to her [place].

## 2019-08-16 NOTE — Progress Notes (Signed)
Counseling Intern Notes from Intake Session:  I spoke to [name] by phone today for our first counseling session. The session focused on getting to know the patient and developing rapport. Patient stated need for emotional support following re-occurance of cancer. She states that she is "proud" and "independent", but this also means she does not usually reach out to friends and family for help. Counseling will offer [name] an outlet to receive support, learn coping strategies, and make meaning of her cancer. The patient states she finds comfort in her faith in God and that she has various activities that help bring her calm including writing and crocheting.  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 She stated she still has life ambitions, but also has thoughts about her mortality. The patient denies any thoughts of SI.    Next counseling appt:  08/23/2019 at 1:00pm   [facility] Counseling Intern Voicemail:  [phone]

## 2019-08-18 ENCOUNTER — Other Ambulatory Visit: Payer: Self-pay

## 2019-08-19 ENCOUNTER — Other Ambulatory Visit: Payer: Self-pay | Admitting: *Deleted

## 2019-08-19 ENCOUNTER — Telehealth: Payer: Self-pay | Admitting: Family

## 2019-08-19 ENCOUNTER — Encounter: Payer: Self-pay | Admitting: *Deleted

## 2019-08-19 NOTE — Telephone Encounter (Signed)
Catherine Rogers from [facility] is calling and stating Pt need shower chair with back and walker with a seat. Send order to [facility] fax# [phone]

## 2019-08-19 NOTE — Patient Outreach (Signed)
[facility]) Care Management  08/19/2019  Catherine Rogers [birth year] [id number]  Telephone Screen  Referral Date: 08/18/19 Referral Source: EMMI Join  Referral Reason: EMMI Join score 8  Insurance: [employer] Medicare and medicaid Wellsite geologist attempt # 1 unsuccessful to the home/mobile  number  No answer. [facility]N RN CM left HIPAA compliant voicemail message along with CM's contact info.   Plan: [facility] RN CM sent an unsuccessful outreach letter and scheduled this patient for another call attempt within 4 business days  Boleslaw Borghi L. Lavina Hamman, RN, BSN, [place] Coordinator Office number [phone] Mobile number [phone]  Main [facility] number [phone] Fax number [phone]

## 2019-08-19 NOTE — Patient Outreach (Signed)
[facility] (THN) Care Management  08/19/2019  Catherine Rogers 11/19/1961 [id number]   Telephone Screen  Referral Date: 08/18/19 Referral Source: EMMI Join -THN CMA Catherine Rogers Referral Reason: EMMI Join score 8  Insurance: [employer] Medicare and medicaid [place] access [place] admissions x 0 and ED visits x 0 in the last 6 months   Patient returned a call to THN RN CM Patient is able to verify HIPAA, DOB and Address Reviewed and addressed EMMI Join referral to THN with patient  THN RN CM went over the information from the engagement assessment completed by the THN CMA to clarify some items  Identified needs Catherine Rogers reports assist is needed with some DME, home improvements (no grab bars in her bathroom or shower)  and supports systems. She and CM discussed benefits of an electronic BP cuff, a shower chair with a back and a Rolator (walker with wheels and a seat) as she gets dizzy a lot and especially after her chemo treatments. She is scheduled for one next week. She presently only has an old manual BP cuff which is difficult to use to assess her BP independently.   THN RN CM discussed bathroom grab bars and THN SW services Pt informed that assistance may or may not be available related to covid pandemic precautions or funding she voiced understanding  Medicare guidelines related to DME discussed She voiced understanding  She reports the need of possible personal care services as her son and husband are not always able to assist and especially after chemo treatments. She reports to CM that she previously denied need of assist for personal care services "because I felt I could do it on my own but now I know I need some help" She reports she is not able to take care of iADLs and her housework has suffered  She has had home health services previously to include HH PT after a surgery She found this beneficial at that time  Catherine Rogers reports having limited masks and not  sanitizer at home  THN RN CM discussed HTN and hypotension.  THN RN CM discussed hypotension related to safety, dizziness and falls. THN RN CM discussed the importance of checking BP prior to taking HTN medications She voiced understanding   THN RN CM spoke with Catherine Rogers at the Primary Care provider office to get a message sent to Catherine Murray NP for assist with orders for a shower chair with a back and a Rolator (walker with wheels and a seat) Discussed pt choice of DME provider [facility]  [facility]N RN CM sent an e-mail to THN CMA to request pt be sent 5 masks to her home address   Social: Catherine Rogers is a 58 year old disabled oncology patient who lives at home with her husband, Catherine Rogers and her son, Catherine Rogers. She states she is often called by her nickname "[name]" She reports living in a 2800 square foot home and it is hard to get around in her home now related to mobility issues. Mr Rogers has medical issues of his own and is available but  Unstable to assist. She reports her son stays with her but works throughout the week in [place]. She has 6 children She reports she is from [place] and some of her family there want her to move closer to the area to be able to assist in her care needs in 2021  For transportation she reports she is driven at times by family but   at other times she uses the local community transportation medical transportation.  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, anxiety, alcohol abuse PAD,  Anemia, arthritis, depression, ectopic pregnancy, hx of closed head injury with brief LOC as a child, Headache, hx of blood transfusion, PTSD, PONV, current  some day smoker, cigars BMI 25.9 wt 08/05/19 165 lbs  On 06/17/19 was 173 lbs    Falls She reports only 1 fall in the last 6 months She reports this occurred when reaching for an item above her on a shelf She became dizzy   DME: manual BP cuff, rolling walker, reading glasses Last eye exam was years ago    Medications: She denies concerns with taking medications as prescribed, affording medications, side effects of medications and questions about medications  Flu shot Pt has not obtained flu shot per preference  Appointments:  08/25/19  radiology [facility]- CT scan 08/26/19 labs and infusion [facility] and meet with Dr Alvy Bimler   Advance Directives: Consent: THN RN CM reviewed [facility] services with patient. Patient gave verbal consent for [facility] telephonic RN CM services.  Plans [facility] RN CM will follow up with Catherine Catherine Rogers within the next 4 business days [facility] RN CM will refer Catherine Rogers to [facility] SW for possible assistance with personal care services and possible home improvement resources (grab bars in bathroom for safety)  Routed note to MDs  [name] CM Care Plan Problem One     Most Recent Value  Care Plan Problem One  home management after chemo related to cancer of left ovary, cancer of right colon  Role Documenting the Problem One  Care Management Telephonic Coordinator  Care Plan for Problem One  Active  [facility] Long Term Goal   over the next 60 days the patient will verbalize improvements in home care after cancer treatments related to DME, home improvements and support system  THN Long Term Goal Start Date  08/19/19  Interventions for Problem One Long Term Goal  assesed needs, reviewed needs from engagement tool, discussed [facility] SW services, inquired of preferences for DME provider, discussed HH PT options, referral to [place], Call to MD office for DME orders to be faxed to [facility]  [facility] CM Short Term Goal #1   over the next 10 days patient will receive contact from [facility] SW for personal care services  options and possible options for home improvements in bathroom  [facility] CM Short Term Goal #1 Start Date  08/19/19  Interventions for Short Term Goal #1  discussed personal care services, discussed [facility] SW services, referred to [place] CM Short Term Goal #2   over the next 14 days patient will have available assistance from DME provider for Rolator and shoer bench with back  THN CM Short Term Goal #2 Start Date  08/19/19  Interventions for Short Term Goal #2  Aas[place] needs Discussed medicare guidelines for DME, inquired about preference of DME provider, Call to MD office to request assistance with orders for DME (Rolator and shoer bench with back) to be faxed to [facility],      Kimberly Catherine. Lavina Hamman, RN, BSN, [place] Coordinator Office number [phone] Mobile number [phone]  Main [facility] number [phone] Fax number [phone]

## 2019-08-22 ENCOUNTER — Other Ambulatory Visit: Payer: Self-pay | Admitting: Family

## 2019-08-22 ENCOUNTER — Ambulatory Visit: Payer: Medicare Other | Admitting: *Deleted

## 2019-08-22 DIAGNOSIS — G893 Neoplasm related pain (acute) (chronic): Secondary | ICD-10-CM

## 2019-08-22 DIAGNOSIS — C562 Malignant neoplasm of left ovary: Secondary | ICD-10-CM

## 2019-08-22 DIAGNOSIS — I739 Peripheral vascular disease, unspecified: Secondary | ICD-10-CM

## 2019-08-22 NOTE — Telephone Encounter (Signed)
Orders on your desk

## 2019-08-22 NOTE — Telephone Encounter (Signed)
Faxed to [place] today.

## 2019-08-23 ENCOUNTER — Other Ambulatory Visit: Payer: Self-pay

## 2019-08-23 ENCOUNTER — Other Ambulatory Visit: Payer: Self-pay | Admitting: *Deleted

## 2019-08-23 NOTE — Patient Outreach (Signed)
[facility]) Care Management  08/23/2019  [name] [birth year] [id number]   Follow up and further assessment for EMMI Join Patient  Referral Date: 08/18/19 Referral Source: EMMI Join  Referral Reason: EMMI Join score 8  Insurance: [employer] and [place] [employer] access   Patient is able to verify HIPAA, DOB and Address Reviewed the reason for the call todaywith patient- to follow up and for further assessment of needs and education  Consent: [facility] RN CM reviewed [facility] services with patient. Patient gave verbal consent for [facility] telephonic RN CM services  Follow up  DME - [facility] RN CM discussed with Catherine Rogers that RN CM called primary MD office on 08/19/19 and requested assist with shower chair with a back and Rolator/walker with a seat [facility] RN CM discussed noting orders received and faxed to [facility] from the MD office  [name] RN CM discussed mailed masks and BP cuff from [facility] office  Catherine Bowe voiced her appreciation   [facility] SW services [facility] RN CM discussed the SW Referral sent and pending contact from [facility] SW for assist with resources for home modifications- bathroom grab bars. Today Catherine Rogers informs [facility] RN CM she would prefer Home health services versus personal care services (previously discuss related to her poor support system)  She reports not wanting to become dependent on personal care services, grab bars and Rolator.  THN RN CM discussed THN RN CM's concern for her home safety related to her voicing having to hold on to walls for safety and mobility issues presently and her reported having more mobility issues after her chemo treatment plus poor support system. CM advised Catherine Midkiff that home safety DME products would be for her safety only for the times she would need them in her home to prevent falls and injury. [facility] RN CM discussed her only becoming dependent on these items if she uses them more than needed.  [facility] RN CM discussed and commended her for wanting to  remain independent as much as possible but cautioned her to consider having a plan that included available DME and services that are available to prevent an unexpected safety occurrence/injury. She voiced understanding S She voices she uses her treadmill as much as possible to continue to strengthen herself She tries to use it daily for at least 50 minutes   THN RN CM updated THN SWs via Epic in basket of pt wanting to be offered home health first and not personal care services at this time   Identified needs on 08/23/19   Home health- Catherine Rogers again discusses becoming weaker after her chemo sessions. She reports she gets weaker in her legs She reports dreading the upcoming Chemo treatment on Friday 08/26/19 related to this. She voices interest in having Dr Alvy Bimler to assist with home health orders. THN RN CM reviewed medicare guidelines of home health and DME (some DME can only obtained at designated time frames) and Home health criteria (home bound (definition- tasking to leave the home) , teachable person, coverage)  She voiced understanding  [facility] RN CM agreed to speak with Dr Alvy Bimler RN about possible upcoming orders for home health services   Ensure supplements She reports getting Ensure via the [place] center with assist of Ernestene Kiel. She reports getting "three times a year"  [facility] RN CM discussed with her the [employer] patient assistance program as a possible assist for her for her Ensure  [facility] RN CM offered to send her the application via mail  and she agreed to receive it  [facility] RN CM sent an [place] via mail   "Blue pads" Pt inquired about this DME and was encouraged to inquire of these supplies from home health agency or medical supply store   Sanitizer [facility] RN CM discussed local stores with discounted sanitizer   Social: Catherine Rogers is a 58 year old disabled oncology patient who lives at home with her husband, [name] and her  son, [name]. She states she is often called by her nickname "[name]" She reports living in a 2800 square foot home and it is hard to get around in her home now related to mobility issues. Mr [name] has medical issues of his own and is available but  Unstable to assist. She reports her son stays with her but works throughout the week in [place]. She has 6 children She reports she is from [facility] [place] [facility] and some of her family there want her to move closer to the area to be able to assist in her care needs in 2021  For transportation she reports she is driven at times by family but at other times she uses the local community transportation medical transportation.  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, anxiety, alcohol abuse PAD,  Anemia, arthritis, depression, ectopic pregnancy, hx of closed head injury with brief LOC as a child, Headache, hx of blood transfusion, PTSD, PONV, current some day smoker, cigars BMI 25.9 wt 08/05/19 165 lbs  On 06/17/19 was 173 lbs  Allergies Strawberries and anything strawberry flavored - Allergy entered in Epic allergy section    Falls She reports only 1 fall in the last 6 months She reports this occurred when reaching for an item above her on a shelf She became dizzy   DME: manual BP cuff, rolling walker, reading glasses Last eye exam was years ago    Medications: She denies concerns with taking medications as prescribed, affording medications, side effects of medications and questions about medications  Flu shot Pt has not obtained flu shot per preference  Appointments:  08/25/19  radiology [facility]- CT scan 08/26/19 labs and infusion [facility]  center and meet with Dr Alvy Bimler   Plans [facility] RN CM will follow up with Catherine Erlinda Hong after her Chemo treatment within the next 7 business days for follow up (chemo, DME, Referrals), further assessment and education of major medical conditions   THN RN CM will continue to collaborate with [facility] SW and oncology staff   Pt encouraged to return a call to [facility] RN CM prn  THN RN CM discussed the outreach letter already sent on with [facility] brochure enclosed for review  Routed note to MDs  [facility] CM Care Plan Problem One     Most Recent Value  Care Plan Problem One  home management after chemo related to cancer of left ovary, cancer of right colon  Role Documenting the Problem One  Care Management Telephonic Coordinator  Care Plan for Problem One  Active  THN Long Term Goal   over the next 60 days the patient will verbalize improvements in home care after cancer treatments related to DME, home improvements and support system  [facility] Long Term Goal  Start Date  08/19/19  Interventions for Problem One Long Term Goal  Assessed and reviewed identified needs and interventions, to contct Oncology office to discuss possible home health orders for after upcoming chemo treatment mobility support Provided encouragement to patient   [facility] CM Short Term Goal #1   over the next 10 days patient will receive contact from [facility] SW for personal care services options and possible options for home improvements in bathroom  THN CM Short Term Goal #1 Start Date  08/19/19  Interventions for Short Term Goal #1  discussed pending contact from [facility] SW to discuss her resources and options   THN CM Short Term Goal #2   over the next 14 days patient will have available assistance from DME provider for Rolator and shoer bench with back  THN CM Short Term Goal #2 Start Date  08/19/19  Interventions for Short Term Goal #2  Reviewed EPIC notes, Noted [facility] orders for DME faxed from MD office to [place] 08/23/19, Updated patient of this       Joelene Millin L. Lavina Hamman, RN, BSN, [place] Coordinator Office number [phone] Mobile number [phone]  Main [facility] number [phone] Fax number [phone]

## 2019-08-24 ENCOUNTER — Other Ambulatory Visit: Payer: Self-pay

## 2019-08-24 ENCOUNTER — Other Ambulatory Visit: Payer: Self-pay | Admitting: *Deleted

## 2019-08-24 NOTE — Patient Outreach (Signed)
[facility]) Care Management  08/24/2019  Catherine Rogers 06/28/1961 [id number]   Social work referral received from [place], to outreach patient regarding home modification resources  (I.e. grab bars in bathroom) Successful outreach to patient today.   [place] [employer] is unable to assist due to minimum age requirement of 73.   Informed patient about home modification services through [place]. Encouraged patient to consider a referral to [place] especially because the process is lengthy and it could be months before work could be completed. Patient declined referral at this time stating that she mostly needs a new shower chair.  Per patient, current chair was given to her by a friend and is old and unstable.  Patient stated that she did not want to "become dependent" on grab bars and that she has a process for safely getting in and out of shower. She also stated that she did not want to go through the paperwork process that is required to determine eligibility for assistance.  Informed patient that RNCM, Joellyn Quails, has been in communication with provider about shower chair and walker.  Closing social work case but encouraged patient to call if she changes her mind about referral.  Ronn Melena, [place] Worker [phone]

## 2019-08-24 NOTE — Patient Outreach (Signed)
[facility]) Care Management  08/24/2019  Hiromi Knodel 17-Sep-1961 [id number]   Care coordination- [place], Oncology staff collaboration   [facility] RN CM collaborated with [facility] SW, Museum/gallery conservator C after her contact with Mrs Grisanti Discussed the different patient options of resources for home modifications ([place] independent living and [place] aging). Pt reports not wanting to get dependent on DME (grab bars) RN CM offered case information.   Orders for shower chair with back and walker with seat (Rolator ) were called in to Primary care MD office on 08/19/19 and per Epic notes faxed to [place] on 08/23/19 by MD office   [facility] RN CM called and spoke with oncology navigator at [place] Pt is having CT imaging on 08/25/19 [facility] but having her Chemo at [place] on 08/26/19 under care of Dr Alvy Bimler not at [place] spoke with Judson Roch, RN for Dr Alvy Bimler about the identified needs when assessed Mrs Hollar on 08/19/19 and 08/23/19, the DME ordered for pt via her Primary MD office for home safety, the requested and discussed home health services after upcoming chemo in relation to her decreases support system and the resource provided for her ensure supplements Judson Roch to speak with Dr Alvy Bimler about this information and possible home health services. Judson Roch states Mrs Rahimi gets a new case of Ensure after her chemo treatments. It is noted in the oncology notes that Mrs Ludden reports becoming weaker after Chemo treatments. [facility] RN CM shared with Judson Roch that patient reports holding on to walls to ambulate   [facility] RN CM will follow up with Mrs Erlinda Hong after her Chemo treatment within the next 7 business days for follow up (chemo, DME, Referrals), further assessment and education of major medical conditions  Routed note to MDs  Joelene Millin L. Lavina Hamman, RN, BSN, [place] Coordinator Office number [phone] Mobile number [phone]  Main [facility]N number  [phone] Fax number [phone]

## 2019-08-25 ENCOUNTER — Ambulatory Visit (HOSPITAL_COMMUNITY)
Admission: RE | Admit: 2019-08-25 | Discharge: 2019-08-25 | Disposition: A | Discharge status: Home / Self Care | Payer: Medicare Other | Source: Ambulatory Visit | Attending: Hematology and Oncology | Admitting: Hematology and Oncology

## 2019-08-25 ENCOUNTER — Other Ambulatory Visit: Payer: Self-pay

## 2019-08-25 DIAGNOSIS — C562 Malignant neoplasm of left ovary: Secondary | ICD-10-CM | POA: Diagnosis not present

## 2019-08-25 DIAGNOSIS — C786 Secondary malignant neoplasm of retroperitoneum and peritoneum: Secondary | ICD-10-CM | POA: Diagnosis not present

## 2019-08-25 MED ORDER — IOHEXOL 300 MG/ML  SOLN
100.0000 mL | Freq: Once | INTRAMUSCULAR | Status: AC | PRN
  Administered 2019-08-25: 100 mL via INTRAVENOUS

## 2019-08-26 ENCOUNTER — Inpatient Hospital Stay: Payer: Medicare Other

## 2019-08-26 ENCOUNTER — Inpatient Hospital Stay: Payer: Medicare Other | Attending: Hematology | Admitting: Hematology and Oncology

## 2019-08-26 ENCOUNTER — Encounter: Payer: Self-pay | Admitting: Hematology and Oncology

## 2019-08-26 ENCOUNTER — Other Ambulatory Visit: Payer: Self-pay

## 2019-08-26 DIAGNOSIS — C562 Malignant neoplasm of left ovary: Secondary | ICD-10-CM | POA: Insufficient documentation

## 2019-08-26 DIAGNOSIS — D61818 Other pancytopenia: Secondary | ICD-10-CM | POA: Diagnosis not present

## 2019-08-26 DIAGNOSIS — G893 Neoplasm related pain (acute) (chronic): Secondary | ICD-10-CM | POA: Diagnosis not present

## 2019-08-26 DIAGNOSIS — Z79899 Other long term (current) drug therapy: Secondary | ICD-10-CM | POA: Insufficient documentation

## 2019-08-26 DIAGNOSIS — I779 Disorder of arteries and arterioles, unspecified: Secondary | ICD-10-CM

## 2019-08-26 DIAGNOSIS — C786 Secondary malignant neoplasm of retroperitoneum and peritoneum: Secondary | ICD-10-CM | POA: Insufficient documentation

## 2019-08-26 DIAGNOSIS — R5381 Other malaise: Secondary | ICD-10-CM | POA: Diagnosis not present

## 2019-08-26 DIAGNOSIS — G620 Drug-induced polyneuropathy: Secondary | ICD-10-CM | POA: Insufficient documentation

## 2019-08-26 DIAGNOSIS — G609 Hereditary and idiopathic neuropathy, unspecified: Secondary | ICD-10-CM | POA: Diagnosis not present

## 2019-08-26 DIAGNOSIS — Z5111 Encounter for antineoplastic chemotherapy: Secondary | ICD-10-CM | POA: Insufficient documentation

## 2019-08-26 DIAGNOSIS — R531 Weakness: Secondary | ICD-10-CM | POA: Diagnosis not present

## 2019-08-26 DIAGNOSIS — C182 Malignant neoplasm of ascending colon: Secondary | ICD-10-CM | POA: Insufficient documentation

## 2019-08-26 LAB — CMP (CANCER CENTER ONLY)
ALT: 8 U/L (ref 0–44)
AST: 13 U/L — ABNORMAL LOW (ref 15–41)
Albumin: 4.2 g/dL (ref 3.5–5.0)
Alkaline Phosphatase: 73 U/L (ref 38–126)
Anion gap: 11 (ref 5–15)
BUN: 12 mg/dL (ref 6–20)
CO2: 26 mmol/L (ref 22–32)
Calcium: 9.7 mg/dL (ref 8.9–10.3)
Chloride: 105 mmol/L (ref 98–111)
Creatinine: 0.83 mg/dL (ref 0.44–1.00)
GFR, Est AFR Am: >60 mL/min (ref >60)
GFR, Estimated: >60 mL/min (ref >60)
Glucose, Bld: 138 mg/dL — ABNORMAL HIGH (ref 70–99)
Potassium: 4.1 mmol/L (ref 3.5–5.1)
Sodium: 142 mmol/L (ref 135–145)
Total Bilirubin: 0.4 mg/dL (ref 0.3–1.2)
Total Protein: 7.7 g/dL (ref 6.5–8.1)

## 2019-08-26 LAB — CBC WITH DIFFERENTIAL (CANCER CENTER ONLY)
Abs Immature Granulocytes: 0.01 10*3/uL (ref 0.00–0.07)
Basophils Absolute: 0 10*3/uL (ref 0.0–0.1)
Basophils Relative: 0 %
Eosinophils Absolute: 0 10*3/uL (ref 0.0–0.5)
Eosinophils Relative: 0 %
HCT: 29.4 % — ABNORMAL LOW (ref 36.0–46.0)
Hemoglobin: 10.4 g/dL — ABNORMAL LOW (ref 12.0–15.0)
Immature Granulocytes: 0 %
Lymphocytes Relative: 22 %
Lymphs Abs: 0.6 10*3/uL — ABNORMAL LOW (ref 0.7–4.0)
MCH: 37.1 pg — ABNORMAL HIGH (ref 26.0–34.0)
MCHC: 35.4 g/dL (ref 30.0–36.0)
MCV: 105 fL — ABNORMAL HIGH (ref 80.0–100.0)
Monocytes Absolute: 0 10*3/uL — ABNORMAL LOW (ref 0.1–1.0)
Monocytes Relative: 2 %
Neutro Abs: 2 10*3/uL (ref 1.7–7.7)
Neutrophils Relative %: 76 %
Platelet Count: 59 10*3/uL — ABNORMAL LOW (ref 150–400)
RBC: 2.8 MIL/uL — ABNORMAL LOW (ref 3.87–5.11)
RDW: 16.8 % — ABNORMAL HIGH (ref 11.5–15.5)
WBC Count: 2.7 10*3/uL — ABNORMAL LOW (ref 4.0–10.5)
nRBC: 0 % (ref 0.0–0.2)

## 2019-08-26 MED ORDER — DEXAMETHASONE 4 MG PO TABS: ORAL_TABLET | ORAL | 9 refills | Status: DC

## 2019-08-26 MED ORDER — ONDANSETRON HCL 8 MG PO TABS: 8.0000 mg | ORAL_TABLET | Freq: Three times a day (TID) | ORAL | 1 refills | Status: DC | PRN

## 2019-08-26 NOTE — Assessment & Plan Note (Signed)
She has some signs of weakness and deconditioning from treatment We discussed referral for home health for physical therapy and Occupational Therapy and she agreed

## 2019-08-26 NOTE — Assessment & Plan Note (Signed)
She has severe pancytopenia due to treatment I plan to reduce the dose of chemotherapy further We will delay her chemotherapy until next week

## 2019-08-26 NOTE — Progress Notes (Signed)
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 [facility]) Overall, she tolerated treatment well except for pancytopenia and some persistent peripheral neuropathy and weakness CT imaging show excellent response of therapy I plan to reduce the dose of treatment further I will also consult advanced home care for physical therapy at home along with occupational therapy to optimize her performance status  Physical debility She has some signs of weakness and deconditioning from treatment We discussed referral for home health for physical therapy and Occupational Therapy and she agreed  Neuropathy, idiopathic She denies worsening peripheral neuropathy We will monitor that carefully  Pancytopenia, acquired [facility]) She has severe pancytopenia due to treatment I plan to reduce the dose of chemotherapy further We will delay her chemotherapy until next week  Cancer associated pain Her pain is well controlled I will continue her pain management We discussed narcotic refill policy   Orders Placed This Encounter  Procedures  . Ambulatory referral to Home Health    Referral Priority:   Routine    Referral Type:   Home Health Care    Referral Reason:   Specialty Services Required    Requested Specialty:   [place]    Number of Visits Requested:   1    INTERVAL HISTORY: Please see below for problem oriented  charting. She returns for further follow-up for chemotherapy and review of test results She tolerated last cycle of treatment well Denies nausea vomiting Denies constipation Her pain is well controlled However, she felt weak and tired  SUMMARY OF ONCOLOGIC HISTORY: Oncology History Overview Note  BRCA1 positive   Cancer of right colon ([place])  09/16/2014 Imaging   CT abd/pel:  a 3.8cm cecal mass with associated ileocecal intussusception, and a 9.1cm solid and cystic mass in the left pelvis,   09/18/2014 Initial Diagnosis   Adenocarcinoma of right colon   09/18/2014 Pathologic Stage   pT3pN1Mx, tumor extends into pericolonic soft tissue and is less than 39m from the serosal surface, LVI 8-), PNI (-), all 23 node negative, one soft tissue tumor deposit, surgical margins negative.    09/18/2014 Surgery   right hemicolectomy and terminal ileoectomy   08/06/2016 Imaging   CT chest/abd/pelvis with contrast IMPRESSION: 1. No recurrent malignancy identified. 2. 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 On the prior exam from 09/13/2015 there was back flow of contrast in the left external iliac artery to supply the left internal iliac artery ; this back flow of contrast is no longer present in the left external iliac artery is occluded. 3. Several pulmonary nodules at or below 4 mm diameter, unchanged. 4. 3 mm hypodensity posteriorly in the body of the pancreas, not well seen previously and subtle today, likely incidental, but merits observation. 5. 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 Prominent degenerative spurring of both acetabula. 6. Lumbar spondylosis and degenerative disc disease causing prominent impingement at L4-5 and mild impingement at L5-S1.   03/08/2017 Imaging  CT CAP W Contrast 03/08/17 IMPRESSION: Chest Impression: 1. Stable small pulmonary nodules within LEFT lung. 2.  No mediastinal lymphadenopathy. Abdomen / Pelvis Impression: 1. No evidence of metastatic colorectal cancer or ovarian cancer within the abdomen pelvis. 2. No lymphadenopathy. 3. Postsurgical change consistent RIGHT hemicolectomy and hysterectomy 4. A fem-fem arterial bypass noted.   11/13/2017 Imaging   CT AP w contrast IMPRESSION: 1. No findings of recurrent malignancy in the abdomen or pelvis. 2. Other imaging findings of potential clinical significance: Chronic AVN of both femoral heads without contour abnormality. Lumbar impingement at L4-5 and L5-S1. Aortic Atherosclerosis (ICD10-I70.0). 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 Partial right hemicolectomy. Hysterectomy.   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 [facility])  06/16/2019 Tumor Marker   Patient's tumor was tested for the following markers: CA-125 Results of the tumor marker test revealed 499   10/09/2014 Imaging   CT of the abdomen and pelvis showed a 9.1 cm solid and cystic mass in the left pelvis concerning for malignancy. This was discovered during her workup for colon cancer.   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 Tumor size 3.7 cm, no lymph nodes removed. T1cNx. Tumor cells are positive for cytokeratin 7 and estrogen receptor.   11/28/2014 Cancer Staging   Staging form: Ovary, AJCC 7th Edition - Clinical: Stage III (rT3, N0, M0) - Signed by Heath Lark, MD on 06/09/2019   03/08/2017 Imaging   CT CAP W Contrast 03/08/17 IMPRESSION: Chest Impression: 1. Stable small pulmonary nodules within LEFT lung. 2. No mediastinal lymphadenopathy. Abdomen / Pelvis Impression: 1. No evidence of metastatic colorectal cancer or ovarian  cancer within the abdomen pelvis. 2. No lymphadenopathy. 3. Postsurgical change consistent RIGHT hemicolectomy and hysterectomy 4. A fem-fem arterial bypass noted.   03/12/2017 Mammogram   MM Right Breast 03/12/17 IMPRESSION: No mammographic or sonographic evidence of malignancy. No correlate identified for the abnormal enhancement seen within the right nipple on MRI of 02/27/2017.    04/02/2017 Pathology Results   Surgical Pathology of Nipple Biopsy: 04/02/17 Diagnosis Nipple Biopsy, Right - BENIGN NIPPLE TISSUE. - NO ATYPIA OR MALIGNANCY.    11/13/2017 Imaging   1. No findings of recurrent malignancy in the abdomen or pelvis. 2. Other imaging findings of potential clinical significance: Chronic AVN of both femoral heads without contour abnormality. Lumbar impingement at L4-5 and L5-S1. Aortic Atherosclerosis (ICD10-I70.0). 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 Partial right hemicolectomy. Hysterectomy.   03/22/2018 Mammogram   Screening mammogram IMPRESSION No evidence of malignancy.     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   06/06/2019 Imaging   1. Tiny bilateral pulmonary nodules stable since 11/12/2018. Continued close attention on follow-up recommended. 2. Incompletely visualized upper abdominal lymphadenopathy better characterized on the abdomen CT from 2 weeks ago. 3.  Aortic Atherosclerois (ICD10-170.0) 4.  Emphysema. ([id number]-D35.9)   06/07/2019 Pathology Results   Soft Tissue Needle Core Biopsy, peritoneal nodule within right lower abdomen - METASTATIC CARCINOMA CONSISTENT  WITH PATIENT'S CLINICAL HISTORY OF PRIMARY OVARIAN CARCINOMA. SEE NOTE Diagnosis  Note Immunohistochemical stains show that the tumor cells are positive for CK7, ER and PAX 8; and negative for CK20 and CDX2. This immunostaining profile is consistent with above diagnosis.   06/07/2019 Procedure   Technically successful CT guided core needle biopsy of indeterminate peritoneal nodule within the right lower abdomen/pelvis.   06/20/2019 -  Chemotherapy   The patient had palonosetron (ALOXI) injection 0.25 mg, 0.25 mg, Intravenous,  Once, 3 of 6 cycles Administration: 0.25 mg (06/20/2019), 0.25 mg (07/11/2019), 0.25 mg (08/05/2019) CARBOplatin (PARAPLATIN) 670 mg in sodium chloride 0.9 % 250 mL chemo infusion, 600 mg (100 % of original dose 600 mg), Intravenous,  Once, 3 of 6 cycles Dose modification:   (original dose 600 mg, Cycle 1), 600 mg (original dose 600 mg, Cycle 1), 480 mg (80 % of original dose 600 mg, Cycle 2, Reason: Dose Not Tolerated),   (original dose 600 mg, Cycle 3, Reason: Dose not tolerated), 532 mg (original dose 600 mg, Cycle 3), 551.5 mg (original dose 600 mg, Cycle 4, Reason: Dose not tolerated), 551.5 mg (original dose 600 mg, Cycle 5) Administration: 670 mg (06/20/2019), 480 mg (07/11/2019), 530 mg (08/05/2019) PACLitaxel (TAXOL) 252 mg in sodium chloride 0.9 % 250 mL chemo infusion (> 47m/m2), 131.25 mg/m2 = 252 mg (75 % of original dose 175 mg/m2), Intravenous,  Once, 3 of 6 cycles Dose modification: 131.25 mg/m2 (75 % of original dose 175 mg/m2, Cycle 1, Reason: Provider Judgment), 1111.7356mg/m2 (66.7 % of original dose 175 mg/m2, Cycle 4, Reason: Dose Not Tolerated) Administration: 252 mg (06/20/2019), 252 mg (07/11/2019), 246 mg (08/05/2019) fosaprepitant (EMEND) 150 mg, dexamethasone (DECADRON) 12 mg in sodium chloride 0.9 % 145 mL IVPB, , Intravenous,  Once, 3 of 6 cycles Administration:  (06/20/2019),  (07/11/2019),  (08/05/2019)  for chemotherapy treatment.    07/11/2019 Tumor Marker   Patient's tumor was tested for the following markers: CA-125 Results of the  tumor marker test revealed 438   08/05/2019 Tumor Marker   Patient's tumor was tested for the following markers: CA-125 Results of the tumor marker test revealed 272.   08/25/2019 Imaging   Ct chest, abdomen and pelvis 1. Slight interval decrease in size of a right lower quadrant peritoneal nodule measuring 1.7 x 1.6 cm, previously 2.2 x 2.2 cm (series 2, image 94, series 5, image 58). Findings are consistent with treatment response.   2. No significant change in ill-defined soft tissue about the sigmoid colon in the hysterectomy bed (series 2, image 101).   3.  Status post hysterectomy.   4. No evidence of metastatic disease in the chest. Stable benign small pulmonary nodules.   5.  Coronary artery disease.  Aortic Atherosclerosis (ICD10-I70.0).       REVIEW OF SYSTEMS:   Constitutional: Denies fevers, chills or abnormal weight loss Eyes: Denies blurriness of vision Ears, nose, mouth, throat, and face: Denies mucositis or sore throat Respiratory: Denies cough, dyspnea or wheezes Cardiovascular: Denies palpitation, chest discomfort or lower extremity swelling Gastrointestinal:  Denies nausea, heartburn or change in bowel habits Skin: Denies abnormal skin rashes Lymphatics: Denies new lymphadenopathy or easy bruising Neurological:Denies numbness, tingling or new weaknesses Behavioral/Psych: Mood is stable, no new changes  All other systems were reviewed with the patient and are negative.  I have reviewed the past medical history, past surgical history, social history and family history with the patient and they are unchanged from previous note.  ALLERGIES:  is allergic to other; demerol [meperidine]; morphine and related; oxycodone; penicillins; strawberry flavor; tylenol [acetaminophen]; and ibuprofen.  MEDICATIONS:  Current Outpatient Medications  Medication Sig Dispense Refill  . acetaminophen (TYLENOL) 325 MG tablet Take 325 mg by mouth as needed.    [name] dexamethasone  (DECADRON) 4 MG tablet Take 3 tabs at the night before and 3 tab the morning of chemotherapy, every 3 weeks, by mouth for 6 cycles 36 tablet 9  . docusate sodium 100 MG CAPS Take 100 mg by mouth 2 (two) times daily as needed for mild constipation. 10 capsule 0  . HYDROcodone-acetaminophen (NORCO/VICODIN) 5-325 MG tablet Take 1 tablet by mouth every 6 (six) hours as needed for moderate pain. 60 tablet 0  . lidocaine (LIDODERM) 5 % PLACE 1 PATCH ONTO THE SKIN EVERY DAY.REMOVE AND DISCARD PATCH WITHIN 12 HOURS OR AS DIRECTED BY PRESCRIBER 30 patch 1  . LORazepam (ATIVAN) 0.5 MG tablet Take 1 tablet (0.5 mg total) by mouth 3 (three) times daily as needed for anxiety. 10 tablet 0  . losartan (COZAAR) 50 MG tablet Take 1 tablet (50 mg total) by mouth daily. 90 tablet 2  . ondansetron (ZOFRAN) 8 MG tablet Take 1 tablet (8 mg total) by mouth every 8 (eight) hours as needed for nausea or vomiting. 30 tablet 1  . pregabalin (LYRICA) 200 MG capsule Take 1 capsule (200 mg total) by mouth every 8 (eight) hours. Please give patient brand name Lyrica 90 capsule 5  . prochlorperazine (COMPAZINE) 10 MG tablet Take 1 tablet (10 mg total) by mouth every 6 (six) hours as needed (Nausea or vomiting). 30 tablet 1   No current facility-administered medications for this visit.     PHYSICAL EXAMINATION: ECOG PERFORMANCE STATUS: 1 - Symptomatic but completely ambulatory  Vitals:   08/26/19 1132  BP: (!) 145/91  Pulse: 94  Resp: 18  Temp: 98.7 F (37.1 C)  SpO2: 100%   Filed Weights   08/26/19 1132 08/26/19 1143  Weight: 161 lb 3.2 oz (73.1 kg) 161 lb 3.2 oz (73.1 kg)    GENERAL:alert, no distress and comfortable SKIN: skin color, texture, turgor are normal, no rashes or significant lesions EYES: normal, Conjunctiva are pink and non-injected, sclera clear OROPHARYNX:no exudate, no erythema and lips, buccal mucosa, and tongue normal  NECK: supple, thyroid normal size, non-tender, without nodularity LYMPH:  no  palpable lymphadenopathy in the cervical, axillary or inguinal LUNGS: clear to auscultation and percussion with normal breathing effort HEART: regular rate & rhythm and no murmurs and no lower extremity edema ABDOMEN:abdomen soft, non-tender and normal bowel sounds Musculoskeletal:no cyanosis of digits and no clubbing  NEURO: alert & oriented x 3 with fluent speech, no focal motor/sensory deficits  LABORATORY DATA:  I have reviewed the data as listed    Component Value Date/Time   NA 142 08/26/2019 1034   NA 141 10/07/2017 1436   K 4.1 08/26/2019 1034   K 4.4 10/07/2017 1436   CL 105 08/26/2019 1034   CO2 26 08/26/2019 1034   CO2 26 10/07/2017 1436   GLUCOSE 138 (H) 08/26/2019 1034   GLUCOSE 85 10/07/2017 1436   BUN 12 08/26/2019 1034   BUN 14.9 10/07/2017 1436   CREATININE 0.83 08/26/2019 1034   CREATININE 1.0 10/07/2017 1436   CALCIUM 9.7 08/26/2019 1034   CALCIUM 9.5 10/07/2017 1436   PROT 7.7 08/26/2019 1034   PROT 7.8 10/07/2017 1436   ALBUMIN 4.2 08/26/2019 1034   ALBUMIN 3.9 10/07/2017 1436  AST 13 (L) 08/26/2019 1034   AST 15 10/07/2017 1436   ALT 8 08/26/2019 1034   ALT 7 10/07/2017 1436   ALKPHOS 73 08/26/2019 1034   ALKPHOS 81 10/07/2017 1436   BILITOT 0.4 08/26/2019 1034   BILITOT 0.54 10/07/2017 1436   GFRNONAA >60 08/26/2019 1034   GFRAA >60 08/26/2019 1034    No results found for: SPEP, UPEP  Lab Results  Component Value Date   WBC 2.7 (L) 08/26/2019   NEUTROABS 2.0 08/26/2019   HGB 10.4 (L) 08/26/2019   HCT 29.4 (L) 08/26/2019   MCV 105.0 (H) 08/26/2019   PLT 59 (L) 08/26/2019      Chemistry      Component Value Date/Time   NA 142 08/26/2019 1034   NA 141 10/07/2017 1436   K 4.1 08/26/2019 1034   K 4.4 10/07/2017 1436   CL 105 08/26/2019 1034   CO2 26 08/26/2019 1034   CO2 26 10/07/2017 1436   BUN 12 08/26/2019 1034   BUN 14.9 10/07/2017 1436   CREATININE 0.83 08/26/2019 1034   CREATININE 1.0 10/07/2017 1436      Component Value  Date/Time   CALCIUM 9.7 08/26/2019 1034   CALCIUM 9.5 10/07/2017 1436   ALKPHOS 73 08/26/2019 1034   ALKPHOS 81 10/07/2017 1436   AST 13 (L) 08/26/2019 1034   AST 15 10/07/2017 1436   ALT 8 08/26/2019 1034   ALT 7 10/07/2017 1436   BILITOT 0.4 08/26/2019 1034   BILITOT 0.54 10/07/2017 1436       RADIOGRAPHIC STUDIES: I have reviewed multiple imaging studies with the patient I have personally reviewed the radiological images as listed and agreed with the findings in the report. Ct Chest W Contrast  Result Date: 08/25/2019 CLINICAL DATA:  Ovarian cancer, peritoneal metastatic disease, chemotherapy EXAM: CT CHEST, ABDOMEN, AND PELVIS WITH CONTRAST TECHNIQUE: Multidetector CT imaging of the chest, abdomen and pelvis was performed following the standard protocol during bolus administration of intravenous contrast. CONTRAST:  118m OMNIPAQUE IOHEXOL 300 MG/ML SOLN, additional oral enteric contrast COMPARISON:  CT chest, 06/06/2019, CT abdomen pelvis, 05/24/2019 FINDINGS: CT CHEST FINDINGS Cardiovascular: Noncalcified aortic atherosclerosis. Normal heart size. Scattered coronary artery calcifications. No pericardial effusion. Mediastinum/Nodes: No enlarged mediastinal, hilar, or axillary lymph nodes. Thyroid gland, trachea, and esophagus demonstrate no significant findings. Lungs/Pleura: Stable 4 mm pulmonary nodule of the right upper lobe (series 4, image 54) and 3 mm nodule of the left upper lobe (series 4, image 82). No pleural effusion or pneumothorax. Musculoskeletal: No chest wall mass or suspicious bone lesions identified. CT ABDOMEN PELVIS FINDINGS Hepatobiliary: No solid liver abnormality is seen. No gallstones, gallbladder wall thickening, or biliary dilatation. Pancreas: Unremarkable. No pancreatic ductal dilatation or surrounding inflammatory changes. Spleen: Normal in size without significant abnormality. Adrenals/Urinary Tract: Adrenal glands are unremarkable. Kidneys are normal, without  renal calculi, solid lesion, or hydronephrosis. Bladder is unremarkable. Stomach/Bowel: Stomach is within normal limits. Status post right partial colectomy. No significant change in ill-defined soft tissue about the sigmoid colon in the hysterectomy bed (series 2, image 101). Vascular/Lymphatic: Severe mixed aortic atherosclerosis with long segment occlusion of the left common iliac artery from the origin and femoral femoral bypass grafting. No enlarged abdominal or pelvic lymph nodes. Reproductive: Status post hysterectomy. No significant change in adjacent ill-defined soft tissue about the hysterectomy bed and adjacent sigmoid colon rectum. Other: No abdominal wall hernia or abnormality. No abdominopelvic ascites. Slight interval decrease in size of a right lower quadrant peritoneal nodule  measuring 1.7 x 1.6 cm, previously 2.2 x 2.2 cm (series 2, image 94, series 5, image 58). Musculoskeletal: No acute or significant osseous findings. IMPRESSION: 1. Slight interval decrease in size of a right lower quadrant peritoneal nodule measuring 1.7 x 1.6 cm, previously 2.2 x 2.2 cm (series 2, image 94, series 5, image 58). Findings are consistent with treatment response. 2. No significant change in ill-defined soft tissue about the sigmoid colon in the hysterectomy bed (series 2, image 101). 3.  Status post hysterectomy. 4. No evidence of metastatic disease in the chest. Stable benign small pulmonary nodules. 5.  Coronary artery disease.  Aortic Atherosclerosis (ICD10-I70.0). Electronically Signed   By: Eddie Candle M.D.   On: 08/25/2019 14:30   Ct Abdomen Pelvis W Contrast  Result Date: 08/25/2019 CLINICAL DATA:  Ovarian cancer, peritoneal metastatic disease, chemotherapy EXAM: CT CHEST, ABDOMEN, AND PELVIS WITH CONTRAST TECHNIQUE: Multidetector CT imaging of the chest, abdomen and pelvis was performed following the standard protocol during bolus administration of intravenous contrast. CONTRAST:  19m OMNIPAQUE  IOHEXOL 300 MG/ML SOLN, additional oral enteric contrast COMPARISON:  CT chest, 06/06/2019, CT abdomen pelvis, 05/24/2019 FINDINGS: CT CHEST FINDINGS Cardiovascular: Noncalcified aortic atherosclerosis. Normal heart size. Scattered coronary artery calcifications. No pericardial effusion. Mediastinum/Nodes: No enlarged mediastinal, hilar, or axillary lymph nodes. Thyroid gland, trachea, and esophagus demonstrate no significant findings. Lungs/Pleura: Stable 4 mm pulmonary nodule of the right upper lobe (series 4, image 54) and 3 mm nodule of the left upper lobe (series 4, image 82). No pleural effusion or pneumothorax. Musculoskeletal: No chest wall mass or suspicious bone lesions identified. CT ABDOMEN PELVIS FINDINGS Hepatobiliary: No solid liver abnormality is seen. No gallstones, gallbladder wall thickening, or biliary dilatation. Pancreas: Unremarkable. No pancreatic ductal dilatation or surrounding inflammatory changes. Spleen: Normal in size without significant abnormality. Adrenals/Urinary Tract: Adrenal glands are unremarkable. Kidneys are normal, without renal calculi, solid lesion, or hydronephrosis. Bladder is unremarkable. Stomach/Bowel: Stomach is within normal limits. Status post right partial colectomy. No significant change in ill-defined soft tissue about the sigmoid colon in the hysterectomy bed (series 2, image 101). Vascular/Lymphatic: Severe mixed aortic atherosclerosis with long segment occlusion of the left common iliac artery from the origin and femoral femoral bypass grafting. No enlarged abdominal or pelvic lymph nodes. Reproductive: Status post hysterectomy. No significant change in adjacent ill-defined soft tissue about the hysterectomy bed and adjacent sigmoid colon rectum. Other: No abdominal wall hernia or abnormality. No abdominopelvic ascites. Slight interval decrease in size of a right lower quadrant peritoneal nodule measuring 1.7 x 1.6 cm, previously 2.2 x 2.2 cm (series 2, image  94, series 5, image 58). Musculoskeletal: No acute or significant osseous findings. IMPRESSION: 1. Slight interval decrease in size of a right lower quadrant peritoneal nodule measuring 1.7 x 1.6 cm, previously 2.2 x 2.2 cm (series 2, image 94, series 5, image 58). Findings are consistent with treatment response. 2. No significant change in ill-defined soft tissue about the sigmoid colon in the hysterectomy bed (series 2, image 101). 3.  Status post hysterectomy. 4. No evidence of metastatic disease in the chest. Stable benign small pulmonary nodules. 5.  Coronary artery disease.  Aortic Atherosclerosis (ICD10-I70.0). Electronically Signed   By: AEddie CandleM.D.   On: 08/25/2019 14:30    All questions were answered. The patient knows to call the clinic with any problems, questions or concerns. No barriers to learning was detected.  I spent 25 minutes counseling the patient face to face. The total time  spent in the appointment was 30 minutes and more than 50% was on counseling and review of test results  Heath Lark, MD 08/26/2019 1:51 PM

## 2019-08-26 NOTE — Assessment & Plan Note (Signed)
Overall, she tolerated treatment well except for pancytopenia and some persistent peripheral neuropathy and weakness CT imaging show excellent response of therapy I plan to reduce the dose of treatment further I will also consult advanced home care for physical therapy at home along with occupational therapy to optimize her performance status

## 2019-08-26 NOTE — Assessment & Plan Note (Signed)
She denies worsening peripheral neuropathy We will monitor that carefully

## 2019-08-26 NOTE — Assessment & Plan Note (Signed)
Her pain is well controlled I will continue her pain management We discussed narcotic refill policy

## 2019-08-27 LAB — CA 125: Cancer Antigen (CA) 125: 189 U/mL — ABNORMAL HIGH (ref 0.0–38.1)

## 2019-08-29 ENCOUNTER — Telehealth: Payer: Self-pay | Admitting: Hematology and Oncology

## 2019-08-29 NOTE — Telephone Encounter (Signed)
I left a message regarding schedule  

## 2019-08-30 ENCOUNTER — Other Ambulatory Visit: Payer: Self-pay | Admitting: *Deleted

## 2019-08-30 NOTE — Patient Outreach (Signed)
[facility]) Care Management  08/30/2019  Chaise Passarella November 23, 1961 [id number]   Attempt for Follow up and further assessment for EMMI Join Patient  Referral Date:08/18/19 Referral Source:EMMI Join Referral Reason:EMMI Join score [place] access   Outreach attempt # 1 No answer. [facility] RN CM left HIPAA compliant voicemail message along with CM's contact info.   Plan: [facility] RN CM sent an unsuccessful outreach letter and scheduled this patient for another call attempt within 4 business days  Heru Montz L. Lavina Hamman, RN, BSN, [place] Coordinator Office number [phone] Mobile number [phone]  Main [facility] number [phone] Fax number [phone]

## 2019-08-31 ENCOUNTER — Telehealth: Payer: Self-pay

## 2019-08-31 NOTE — Telephone Encounter (Signed)
She called and requested and refill on her pain medication.

## 2019-09-01 ENCOUNTER — Other Ambulatory Visit: Payer: Self-pay | Admitting: Hematology and Oncology

## 2019-09-01 MED ORDER — HYDROCODONE-ACETAMINOPHEN 5-325 MG PO TABS: 1.0000 | ORAL_TABLET | Freq: Four times a day (QID) | ORAL | 0 refills | Status: DC | PRN

## 2019-09-01 NOTE — Telephone Encounter (Signed)
I sent to her [facility] store

## 2019-09-01 NOTE — Telephone Encounter (Signed)
Called and left below message. Ask her to call the office for details.

## 2019-09-02 ENCOUNTER — Other Ambulatory Visit: Payer: Self-pay | Admitting: *Deleted

## 2019-09-02 NOTE — Patient Outreach (Signed)
[place] [facility]) Care Management  09/02/2019  Catherine Rogers Sep 08, 1961 [id number]   Attempt for Follow up and further assessment for EMMI Join Patient  Referral Date:08/18/19 Referral Source:EMMI Join Referral Reason:EMMI Join score [place] access   Outreach attempt # 2 No answer. [facility] RN CM left HIPAA compliant voicemail message along with CM's contact info.   Plan: [facility] RN CM scheduled this patient for another call attempt within 4 business days  Jayelle Page L. Lavina Hamman, RN, BSN, [place] Coordinator Office number [phone] Mobile number [phone]  Main [facility] number [phone] Fax number [phone]

## 2019-09-05 ENCOUNTER — Inpatient Hospital Stay: Payer: Medicare Other

## 2019-09-05 ENCOUNTER — Inpatient Hospital Stay (HOSPITAL_BASED_OUTPATIENT_CLINIC_OR_DEPARTMENT_OTHER): Payer: Medicare Other | Admitting: Medical

## 2019-09-05 ENCOUNTER — Other Ambulatory Visit: Payer: Self-pay

## 2019-09-05 VITALS — BP 154/98 | HR 96 | Temp 98.5°F | Resp 18

## 2019-09-05 DIAGNOSIS — C562 Malignant neoplasm of left ovary: Secondary | ICD-10-CM

## 2019-09-05 DIAGNOSIS — Z5111 Encounter for antineoplastic chemotherapy: Secondary | ICD-10-CM | POA: Diagnosis not present

## 2019-09-05 DIAGNOSIS — C786 Secondary malignant neoplasm of retroperitoneum and peritoneum: Secondary | ICD-10-CM | POA: Diagnosis not present

## 2019-09-05 DIAGNOSIS — C182 Malignant neoplasm of ascending colon: Secondary | ICD-10-CM | POA: Diagnosis not present

## 2019-09-05 DIAGNOSIS — G620 Drug-induced polyneuropathy: Secondary | ICD-10-CM | POA: Diagnosis not present

## 2019-09-05 DIAGNOSIS — D61818 Other pancytopenia: Secondary | ICD-10-CM | POA: Diagnosis not present

## 2019-09-05 LAB — CMP (CANCER CENTER ONLY)
ALT: 7 U/L (ref 0–44)
AST: 14 U/L — ABNORMAL LOW (ref 15–41)
Albumin: 3.9 g/dL (ref 3.5–5.0)
Alkaline Phosphatase: 67 U/L (ref 38–126)
Anion gap: 12 (ref 5–15)
BUN: 16 mg/dL (ref 6–20)
CO2: 22 mmol/L (ref 22–32)
Calcium: 9.5 mg/dL (ref 8.9–10.3)
Chloride: 106 mmol/L (ref 98–111)
Creatinine: 0.83 mg/dL (ref 0.44–1.00)
GFR, Est AFR Am: >60 mL/min (ref >60)
GFR, Estimated: >60 mL/min (ref >60)
Glucose, Bld: 126 mg/dL — ABNORMAL HIGH (ref 70–99)
Potassium: 4.4 mmol/L (ref 3.5–5.1)
Sodium: 140 mmol/L (ref 135–145)
Total Bilirubin: 0.3 mg/dL (ref 0.3–1.2)
Total Protein: 7.3 g/dL (ref 6.5–8.1)

## 2019-09-05 LAB — CBC WITH DIFFERENTIAL (CANCER CENTER ONLY)
Abs Immature Granulocytes: 0.01 10*3/uL (ref 0.00–0.07)
Basophils Absolute: 0 10*3/uL (ref 0.0–0.1)
Basophils Relative: 0 %
Eosinophils Absolute: 0 10*3/uL (ref 0.0–0.5)
Eosinophils Relative: 0 %
HCT: 29 % — ABNORMAL LOW (ref 36.0–46.0)
Hemoglobin: 10.4 g/dL — ABNORMAL LOW (ref 12.0–15.0)
Immature Granulocytes: 0 %
Lymphocytes Relative: 27 %
Lymphs Abs: 0.6 10*3/uL — ABNORMAL LOW (ref 0.7–4.0)
MCH: 38 pg — ABNORMAL HIGH (ref 26.0–34.0)
MCHC: 35.9 g/dL (ref 30.0–36.0)
MCV: 105.8 fL — ABNORMAL HIGH (ref 80.0–100.0)
Monocytes Absolute: 0 10*3/uL — ABNORMAL LOW (ref 0.1–1.0)
Monocytes Relative: 2 %
Neutro Abs: 1.6 10*3/uL — ABNORMAL LOW (ref 1.7–7.7)
Neutrophils Relative %: 71 %
Platelet Count: 109 10*3/uL — ABNORMAL LOW (ref 150–400)
RBC: 2.74 MIL/uL — ABNORMAL LOW (ref 3.87–5.11)
RDW: 17.7 % — ABNORMAL HIGH (ref 11.5–15.5)
WBC Count: 2.3 10*3/uL — ABNORMAL LOW (ref 4.0–10.5)
nRBC: 0 % (ref 0.0–0.2)

## 2019-09-05 MED ORDER — SODIUM CHLORIDE 0.9 % IV SOLN
116.6667 mg/m2 | Freq: Once | INTRAVENOUS | Status: AC
  Administered 2019-09-05: 216 mg via INTRAVENOUS
  Filled 2019-09-05: qty 36

## 2019-09-05 MED ORDER — SODIUM CHLORIDE 0.9 % IV SOLN
Freq: Once | INTRAVENOUS | Status: AC
  Administered 2019-09-05: 10:00:00 via INTRAVENOUS
  Filled 2019-09-05: qty 5

## 2019-09-05 MED ORDER — DIPHENHYDRAMINE HCL 50 MG/ML IJ SOLN
INTRAMUSCULAR | Status: AC
  Filled 2019-09-05: qty 1

## 2019-09-05 MED ORDER — PALONOSETRON HCL INJECTION 0.25 MG/5ML
INTRAVENOUS | Status: AC
  Filled 2019-09-05: qty 5

## 2019-09-05 MED ORDER — PALONOSETRON HCL INJECTION 0.25 MG/5ML
0.2500 mg | Freq: Once | INTRAVENOUS | Status: AC
  Administered 2019-09-05: 0.25 mg via INTRAVENOUS

## 2019-09-05 MED ORDER — SODIUM CHLORIDE 0.9 % IV SOLN
Freq: Once | INTRAVENOUS | Status: AC
  Administered 2019-09-05: 09:00:00 via INTRAVENOUS
  Filled 2019-09-05: qty 250

## 2019-09-05 MED ORDER — FAMOTIDINE IN NACL 20-0.9 MG/50ML-% IV SOLN
INTRAVENOUS | Status: AC
  Filled 2019-09-05: qty 50

## 2019-09-05 MED ORDER — SODIUM CHLORIDE 0.9 % IV SOLN
530.0000 mg | Freq: Once | INTRAVENOUS | Status: AC
  Administered 2019-09-05: 530 mg via INTRAVENOUS
  Filled 2019-09-05: qty 53

## 2019-09-05 MED ORDER — FAMOTIDINE IN NACL 20-0.9 MG/50ML-% IV SOLN
20.0000 mg | Freq: Once | INTRAVENOUS | Status: AC
  Administered 2019-09-05: 20 mg via INTRAVENOUS

## 2019-09-05 MED ORDER — DIPHENHYDRAMINE HCL 50 MG/ML IJ SOLN
50.0000 mg | Freq: Once | INTRAMUSCULAR | Status: AC
  Administered 2019-09-05: 50 mg via INTRAVENOUS

## 2019-09-05 NOTE — Patient Instructions (Signed)
[facility] Discharge Instructions for Patients Receiving Chemotherapy  Today you received the following chemotherapy agents Paclitaxel (TAXOL) & Carboplatin (PARAPLATIN).  To help prevent nausea and vomiting after your treatment, we encourage you to take your nausea medication as prescribed.   If you develop nausea and vomiting that is not controlled by your nausea medication, call the clinic.   BELOW ARE SYMPTOMS THAT SHOULD BE REPORTED IMMEDIATELY:  *FEVER GREATER THAN 100.5 F  *CHILLS WITH OR WITHOUT FEVER  NAUSEA AND VOMITING THAT IS NOT CONTROLLED WITH YOUR NAUSEA MEDICATION  *UNUSUAL SHORTNESS OF BREATH  *UNUSUAL BRUISING OR BLEEDING  TENDERNESS IN MOUTH AND THROAT WITH OR WITHOUT PRESENCE OF ULCERS  *URINARY PROBLEMS  *BOWEL PROBLEMS  UNUSUAL RASH Items with * indicate a potential emergency and should be followed up as soon as possible.  Feel free to call the clinic should you have any questions or concerns. The clinic phone number is [phone].  Please show the CHEMO ALERT CARD at check-in to the Emergency Department and triage nurse.  Coronavirus (COVID-19) Are you at risk?  Are you at risk for the Coronavirus (COVID-19)?  To be considered HIGH RISK for Coronavirus (COVID-19), you have to meet the following criteria:  . Traveled to [place], [place], [place], [place] or [place]; or in the [place] to [place], [place], [place], or [place]; and have fever, cough, and shortness of breath within the last 2 weeks of travel OR . Been in close contact with a person diagnosed with COVID-19 within the last 2 weeks and have fever, cough, and shortness of breath . IF YOU DO NOT MEET THESE CRITERIA, YOU ARE CONSIDERED LOW RISK FOR COVID-19.  What to do if you are HIGH RISK for COVID-19?  . If you are having a medical emergency, call 911. . Seek medical care right away. Before you go to a doctor's office, urgent care or emergency department,  call ahead and tell them about your recent travel, contact with someone diagnosed with COVID-19, and your symptoms. You should receive instructions from your physician's office regarding next steps of care.  . When you arrive at healthcare provider, tell the healthcare staff immediately you have returned from visiting [place], [place], [place], [place] or [place]; or traveled in the [place] to [place], [place], [place], or [place]; in the last two weeks or you have been in close contact with a person diagnosed with COVID-19 in the last 2 weeks.   . Tell the health care staff about your symptoms: fever, cough and shortness of breath. . After you have been seen by a medical provider, you will be either: o Tested for (COVID-19) and discharged home on quarantine except to seek medical care if symptoms worsen, and asked to  - Stay home and avoid contact with others until you get your results (4-5 days)  - Avoid travel on public transportation if possible (such as bus, train, or airplane) or o Sent to the Emergency Department by EMS for evaluation, COVID-19 testing, and possible admission depending on your condition and test results.  What to do if you are LOW RISK for COVID-19?  Reduce your risk of any infection by using the same precautions used for avoiding the common cold or flu:  . Wash your hands often with soap and warm water for at least 20 seconds.  If soap and water are not readily available, use an alcohol-based hand sanitizer with at least 60% alcohol.  .   If coughing or sneezing, cover your mouth and nose by coughing or sneezing into the elbow areas of your shirt or coat, into a tissue or into your sleeve (not your hands). . Avoid shaking hands with others and consider head nods or verbal greetings only. . Avoid touching your eyes, nose, or mouth with unwashed hands.  . Avoid close contact with people who are sick. . Avoid places or events with large numbers of people in one  location, like concerts or sporting events. . Carefully consider travel plans you have or are making. . If you are planning any travel outside or inside the [place], visit the CDC's Travelers' Health webpage for the latest health notices. . If you have some symptoms but not all symptoms, continue to monitor at home and seek medical attention if your symptoms worsen. . If you are having a medical emergency, call 911.   ADDITIONAL HEALTHCARE OPTIONS FOR PATIENTS  [place] [facility] / e-Visit: [email or web address]         [facility]: [phone]  [facility]: [phone]                   [facility]: [phone]   

## 2019-09-05 NOTE — Progress Notes (Signed)
The patient was seen in the infusion room today as she was receiving chemotherapy.  She was concerned as she had a small nodular density in the left anterior forearm along a blood vessel.  Her exam showed a blood vessel that was hyperpigmented with a 2 to 3 mm hard nodule at the site of what appeared to be a prior IV insertion site.  The patient was reassured and was told to watch the area.  She was told that she could put vitamin E on the site if she desired.  Sandi Mealy, MHS, PA-C Physician Assistant

## 2019-09-07 ENCOUNTER — Ambulatory Visit: Payer: Self-pay | Admitting: *Deleted

## 2019-09-08 ENCOUNTER — Encounter: Payer: Self-pay | Admitting: *Deleted

## 2019-09-08 ENCOUNTER — Other Ambulatory Visit: Payer: Self-pay | Admitting: *Deleted

## 2019-09-08 NOTE — Patient Outreach (Signed)
[facility] [facility]) Care Management  09/08/2019  Catherine Rogers July 22, 1961 [id number]   Attempt forFollow up and further assessment for EMMI Join Patient  Referral Date:08/18/19 Referral Source:EMMI Join Referral Reason:EMMI Join score [place] access   Pt called and left [facility] RN CM a voice message on 09/08/19 at 1522 She states she has received her BP cuff and is doing well. She noted on h er after summary sheets that [place] CM had her assigned to call today 09/08/19   [place] patient Outreach attempt # 3 No answer. [facility] RN CM left HIPAA compliant voicemail message along with CM's contact info.  [facility] RN Cm requested a return cal to f/u on [facility] services   Plan: [place] scheduled this patient for another call attempt within 4 business days An unsuccessful letter was sent on 09/02/19 for this [facility] engaged patient Pending a possible return cal [facility] RN CM will start call attempt workflow prn   [name], RN, BSN, [place] Community Coordinator Office number [phone] Mobile number [phone]  Main [facility] number [phone] Fax number [phone]

## 2019-09-08 NOTE — Patient Outreach (Signed)
[place] [facility]) Care Management  09/08/2019  [name] May 04, 1961 [id number]   Follow up and further assessment for EMMI Join Patient  Referral Date:08/18/19 Referral Source:EMMI Join Referral Reason:EMMI Join score [place] access  Patient returned a call to [facility] RN CM Patient is able to verify HIPAA Reviewed the purpose of the follow up call with the patient  Consent: [facility] RN CM reviewed [facility] services with patient. Patient gave verbal consent for services.  DME  She confirms she received masks and BP cuff. 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 She reports she does not prefer to have a port access and now the right arm was used for chemo plus she reports only having 2 more chemo treatments left She has used heat and her pain medication at home without relief She and CM discuss other pain management options like cold, TENs and massage. She will check her local Equipment supply store for possibly a massager.  She reports her tumor is decreasing as reported during the last oncology visit   Supplements  She reports she did not receive the case of Ensure the oncology RN, Judson Roch discussed she would get after her chemo visit. She states she had to leave directly after her chemo treatment but will contact [employer] to see if it is still available. She reports receiving [employer] product application and will not be completing it as "it asked me too many questions about my personal business" She voices she uses her "dehydration pops"   Counseling  She discussed her preference not to use the  offered counselor Advertising account executive) as she does not feel comfortable with the counselor. She prefers to knit, journal and speak to members of her woman's group.   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, cancer of right colon  Role Documenting the Problem One  Care Management Telephonic Coordinator  Care Plan for Problem One  Active  [facility] Long Term Goal   over the next 60 days the patient will verbalize improvements in home care after cancer treatments related to DME, home improvements and support system  [facility] Long Term Goal Start Date  08/19/19  Interventions for Problem One Long Term Goal  Discussed intervntions to assist with left arm neuropathy pain  [facility] CM Short Term Goal #1   over the next 10 days patient will receive contact from [facility] SW for personal care services options and possible options for home improvements in bathroom  [facility] CM Short Term Goal #1 Start Date  08/19/19  [facility] CM Short Term Goal #1 Met Date  09/08/19  [facility] CM Short Term Goal #2   over the next 14 days patient will have available assistance from DME provider for  Rolator and shower bench with back  [facility] CM Short Term Goal #2 Start Date  08/19/19  Interventions for Short Term Goal #2  Assessed if she received DME, shared collaboration information CM had with Judson Roch, To follow up further   [facility] CM Short Term Goal #3  over then next 20 days patient will have intervention or DME to assist with with left arm pain as evidence by her verbalizing during follow up call   [facility] CM Short Term Goal #3 Start Date  09/08/19  Interventions for Short Tern Goal #3  assesed left arm pain, offered interventions to assist  with pain, discussed nerve impairment       [name], RN, BSN, [place] Coordinator Office number [phone] Mobile number [phone]  Main [facility] number [phone] Fax number [phone]

## 2019-09-10 ENCOUNTER — Other Ambulatory Visit: Payer: Self-pay | Admitting: *Deleted

## 2019-09-10 NOTE — Patient Outreach (Signed)
[facility]) Care Management  09/10/2019  Catherine Rogers 02-16-1961 [id number]   Care coordination   Pt called [facility] RN CM on 09/09/19 1614 to update CM that her DME store (? [employer] on [address], [place]) was closed today and she was not able to check on the DME massage item to assist with the pain in her arm She reports the pain is tolerable today  Plans [facility] RN CM will f/u with [name] within 7-14 days  Pt encouraged to return a call to [place] CM prn  Joelene Millin L. Lavina Hamman, RN, BSN, [place] Coordinator Office number [phone] Mobile number [phone]  Main [facility] number [phone] Fax number [phone]

## 2019-09-13 ENCOUNTER — Ambulatory Visit: Payer: Self-pay | Admitting: *Deleted

## 2019-09-15 ENCOUNTER — Telehealth: Payer: Self-pay | Admitting: *Deleted

## 2019-09-15 ENCOUNTER — Other Ambulatory Visit: Payer: Self-pay

## 2019-09-15 ENCOUNTER — Other Ambulatory Visit: Payer: Self-pay | Admitting: *Deleted

## 2019-09-15 ENCOUNTER — Other Ambulatory Visit: Payer: Self-pay | Admitting: Hematology and Oncology

## 2019-09-15 MED ORDER — HYDROCODONE-ACETAMINOPHEN 5-325 MG PO TABS: 1.0000 | ORAL_TABLET | Freq: Four times a day (QID) | ORAL | 0 refills | Status: DC | PRN

## 2019-09-15 NOTE — Telephone Encounter (Signed)
done

## 2019-09-15 NOTE — Telephone Encounter (Signed)
Patient calling for a refill of Hydrocodone please to [facility] in [place].

## 2019-09-15 NOTE — Patient Outreach (Signed)
[facility] ([facility]) Care Management  09/15/2019  Catherine Rogers 05/02/1961 [id number]   Follow up and further assessment for EMMI Join Patient  Referral Date:08/18/19 Referral Source:EMMI Join Referral Reason:EMMI Join score 8 Insurance:[employer] Medicareand medicaid [place] access  Patient returned a call to [facility] RN CM Patient is able to verify HIPAA Reviewed the purpose of the follow up call with the patient  Consent: [facility] RN CM reviewed [facility] services with patient. Patient gave verbal consent for services.  DME  She confirms she has not received ensure, or DME for home care after treatment  [facility] RN CM spoke with the primary care MD and oncology staff (Sarah) on 08/24/19 about DME for home safety (shower chair and Rolator)  Pain  Stomach pain related to her cancer after treatment and left arm pain 5/10 has been 9/10 She took pain medication 15 minutes prior to this call Reports pain is a ongoing pain "like someone is sticking their nails in me" She reports she tries to take the pain medication more routine q 6 hours after her treatments and then wean back to q 8 hours about 2 weeks after the treatment   Left arm neuropath pain related to a nodule of the left anterior forearm related to a vessel used for previous chemo treatment.  She has used heat and her pain medication at home without relief She and CM discuss the search for TENs and massage options. She and CM checked [facility] for possibly DME without success She reports she has an intermittent pneumatic compression machine for leg compression from a previous admission/procedure but no sleeves  Supplements  She reports she has not receive the case of Ensure from the oncology RN, Sarah discussed she would get after her chemo visit. 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, cancer of right colon  Role Documenting the Problem One  Care Management Telephonic Coordinator  Care Plan for Problem One  Active  [facility] Long Term Goal   over the next 60 days the patient will verbalize improvements in home care after cancer treatments related to DME, home improvements and support system  [facility] Long Term Goal Start Date  08/19/19  Interventions for Problem One Long Term Goal  assess for DME and Ensure answered questions to followup  [facility] CM Short Term Goal #1   over the next 10 days patient will receive contact from [facility] SW for personal care services options and possible options for home improvements in bathroom  [facility] CM Short Term Goal #1 Start Date  08/19/19  [facility] CM Short Term Goal #1 Met Date  09/08/19  [facility] CM Short Term Goal #2   over the next 14 days patient will have available assistance from DME provider for Rolator and shower bench with back  [facility] CM Short Term Goal #2 Start Date  08/19/19  Interventions for Short Term Goal #2  assessed for DME delivery, answered questions, to follow up  [facility]   CM Short Term Goal #3  over then next 20 days patient will have intervention or DME to assist with with left arm pain as evidence by her verbalizing during follow up call   [facility] CM Short Term Goal #3 Start Date  09/08/19  Interventions for Short Tern Goal #3  assessed for DME delivery, answered questions, to follow up      [place]. Lavina Hamman, RN, BSN, [place] Coordinator Office number [phone] Mobile number [phone]  Main [facility] number [phone] Fax number [phone]

## 2019-09-19 ENCOUNTER — Other Ambulatory Visit: Payer: Self-pay | Admitting: *Deleted

## 2019-09-19 NOTE — Patient Outreach (Signed)
[place] [facility]) Care Management  09/19/2019  Catherine Rogers October 28, 1961 [id number]   Care coordination  [facility] RN CM called and left a message with Jess, on call staff to give to the nurse of Dr Heath Lark related to DME (rolator, shower chair with back) and Ensure mailed to her home   [facility] RN CM sent and Epic in basket to Dr Alvy Bimler  Plans [facility] RN CM will f/u with [name] within 7-14 days  [name], RN, BSN, [place] Coordinator Office number [phone] Mobile number [phone]  Main [facility] number [phone] Fax number [phone]

## 2019-09-20 ENCOUNTER — Other Ambulatory Visit: Payer: Self-pay | Admitting: Hematology and Oncology

## 2019-09-20 ENCOUNTER — Other Ambulatory Visit: Payer: Self-pay | Admitting: *Deleted

## 2019-09-20 ENCOUNTER — Telehealth: Payer: Self-pay

## 2019-09-20 DIAGNOSIS — C562 Malignant neoplasm of left ovary: Secondary | ICD-10-CM

## 2019-09-20 DIAGNOSIS — R5381 Other malaise: Secondary | ICD-10-CM

## 2019-09-20 NOTE — Telephone Encounter (Signed)
The order is in place Please make sure Home Health PT can start the process

## 2019-09-20 NOTE — Telephone Encounter (Signed)
Called and left a message asking to call the office.

## 2019-09-20 NOTE — Patient Outreach (Signed)
[place] [facility]) Care Management  09/20/2019  Catherine Rogers 05-30-1961 [id number]   Care coordination   [facility] RN CM received responses from Dr Alvy Bimler related to DME (rolator, shower chair with back) and Ensure mailed to her home. CM shared with her that patient had reported holding on to the walls after her last 2 treatments. Noted MD requested office staff to call patient   [facility] RN Cm spoke with Mikeal Hawthorne at [place] about compression sleeves for her compression machine. The [place] and [employer] [place] retail stores were consulted It is not carried in the retail stores  [place] called to update patient on collaboration with Dr Alvy Bimler and [employer]   Plan [facility] RN CM will f/u with [name] within 7-14 days  Johnwilliam Shepperson L. Lavina Hamman, RN, BSN, [place] Coordinator Office number [phone] Mobile number [phone]  Main [facility] number [phone] Fax number [phone]

## 2019-09-20 NOTE — Telephone Encounter (Signed)
Called Santiago Glad with [facility] regarding PT referral.

## 2019-09-20 NOTE — Telephone Encounter (Signed)
Called back. Told per Dr. Alvy Bimler, she recommends PT evaluation before she prescribe her DME. Can she go to cancer rehab center for PT?  If not, then we can set up home PT referral. She verbalized understanding.  She prefers home health PT referral.

## 2019-09-21 ENCOUNTER — Ambulatory Visit: Payer: Medicare Other | Admitting: *Deleted

## 2019-09-22 DIAGNOSIS — D61810 Antineoplastic chemotherapy induced pancytopenia: Secondary | ICD-10-CM | POA: Diagnosis not present

## 2019-09-22 DIAGNOSIS — Z9181 History of falling: Secondary | ICD-10-CM | POA: Diagnosis not present

## 2019-09-22 DIAGNOSIS — Z79891 Long term (current) use of opiate analgesic: Secondary | ICD-10-CM | POA: Diagnosis not present

## 2019-09-22 DIAGNOSIS — G609 Hereditary and idiopathic neuropathy, unspecified: Secondary | ICD-10-CM | POA: Diagnosis not present

## 2019-09-22 DIAGNOSIS — C562 Malignant neoplasm of left ovary: Secondary | ICD-10-CM | POA: Diagnosis not present

## 2019-09-22 DIAGNOSIS — G893 Neoplasm related pain (acute) (chronic): Secondary | ICD-10-CM | POA: Diagnosis not present

## 2019-09-26 ENCOUNTER — Other Ambulatory Visit: Payer: Self-pay

## 2019-09-26 ENCOUNTER — Telehealth: Payer: Self-pay

## 2019-09-26 ENCOUNTER — Encounter: Payer: Self-pay | Admitting: Hematology and Oncology

## 2019-09-26 ENCOUNTER — Encounter: Payer: Self-pay | Admitting: Nutrition

## 2019-09-26 ENCOUNTER — Inpatient Hospital Stay: Payer: Medicare Other

## 2019-09-26 ENCOUNTER — Inpatient Hospital Stay (HOSPITAL_BASED_OUTPATIENT_CLINIC_OR_DEPARTMENT_OTHER): Payer: Medicare Other | Admitting: Hematology and Oncology

## 2019-09-26 ENCOUNTER — Telehealth: Payer: Self-pay | Admitting: Hematology and Oncology

## 2019-09-26 ENCOUNTER — Inpatient Hospital Stay: Payer: Medicare Other | Attending: Hematology

## 2019-09-26 VITALS — BP 153/84 | HR 92 | Temp 98.2°F | Resp 18 | Ht 67.0 in | Wt 162.7 lb

## 2019-09-26 DIAGNOSIS — D61818 Other pancytopenia: Secondary | ICD-10-CM | POA: Diagnosis not present

## 2019-09-26 DIAGNOSIS — G893 Neoplasm related pain (acute) (chronic): Secondary | ICD-10-CM

## 2019-09-26 DIAGNOSIS — R531 Weakness: Secondary | ICD-10-CM | POA: Insufficient documentation

## 2019-09-26 DIAGNOSIS — R5381 Other malaise: Secondary | ICD-10-CM | POA: Insufficient documentation

## 2019-09-26 DIAGNOSIS — C562 Malignant neoplasm of left ovary: Secondary | ICD-10-CM | POA: Diagnosis not present

## 2019-09-26 DIAGNOSIS — C182 Malignant neoplasm of ascending colon: Secondary | ICD-10-CM | POA: Diagnosis not present

## 2019-09-26 DIAGNOSIS — Z5111 Encounter for antineoplastic chemotherapy: Secondary | ICD-10-CM | POA: Insufficient documentation

## 2019-09-26 DIAGNOSIS — C786 Secondary malignant neoplasm of retroperitoneum and peritoneum: Secondary | ICD-10-CM | POA: Insufficient documentation

## 2019-09-26 DIAGNOSIS — I779 Disorder of arteries and arterioles, unspecified: Secondary | ICD-10-CM

## 2019-09-26 DIAGNOSIS — G629 Polyneuropathy, unspecified: Secondary | ICD-10-CM | POA: Diagnosis not present

## 2019-09-26 DIAGNOSIS — D539 Nutritional anemia, unspecified: Secondary | ICD-10-CM

## 2019-09-26 DIAGNOSIS — Z79899 Other long term (current) drug therapy: Secondary | ICD-10-CM | POA: Insufficient documentation

## 2019-09-26 LAB — CBC WITH DIFFERENTIAL (CANCER CENTER ONLY)
Abs Immature Granulocytes: 0.01 10*3/uL (ref 0.00–0.07)
Basophils Absolute: 0 10*3/uL (ref 0.0–0.1)
Basophils Relative: 0 %
Eosinophils Absolute: 0 10*3/uL (ref 0.0–0.5)
Eosinophils Relative: 0 %
HCT: 28.7 % — ABNORMAL LOW (ref 36.0–46.0)
Hemoglobin: 10.4 g/dL — ABNORMAL LOW (ref 12.0–15.0)
Immature Granulocytes: 0 %
Lymphocytes Relative: 24 %
Lymphs Abs: 0.8 10*3/uL (ref 0.7–4.0)
MCH: 38.8 pg — ABNORMAL HIGH (ref 26.0–34.0)
MCHC: 36.2 g/dL — ABNORMAL HIGH (ref 30.0–36.0)
MCV: 107.1 fL — ABNORMAL HIGH (ref 80.0–100.0)
Monocytes Absolute: 0.1 10*3/uL (ref 0.1–1.0)
Monocytes Relative: 2 %
Neutro Abs: 2.4 10*3/uL (ref 1.7–7.7)
Neutrophils Relative %: 74 %
Platelet Count: 59 10*3/uL — ABNORMAL LOW (ref 150–400)
RBC: 2.68 MIL/uL — ABNORMAL LOW (ref 3.87–5.11)
RDW: 17.2 % — ABNORMAL HIGH (ref 11.5–15.5)
WBC Count: 3.3 10*3/uL — ABNORMAL LOW (ref 4.0–10.5)
nRBC: 0 % (ref 0.0–0.2)

## 2019-09-26 LAB — CMP (CANCER CENTER ONLY)
ALT: 8 U/L (ref 0–44)
AST: 16 U/L (ref 15–41)
Albumin: 4.2 g/dL (ref 3.5–5.0)
Alkaline Phosphatase: 78 U/L (ref 38–126)
Anion gap: 13 (ref 5–15)
BUN: 22 mg/dL — ABNORMAL HIGH (ref 6–20)
CO2: 24 mmol/L (ref 22–32)
Calcium: 10.2 mg/dL (ref 8.9–10.3)
Chloride: 105 mmol/L (ref 98–111)
Creatinine: 0.87 mg/dL (ref 0.44–1.00)
GFR, Est AFR Am: >60 mL/min (ref >60)
GFR, Estimated: >60 mL/min (ref >60)
Glucose, Bld: 126 mg/dL — ABNORMAL HIGH (ref 70–99)
Potassium: 4.4 mmol/L (ref 3.5–5.1)
Sodium: 142 mmol/L (ref 135–145)
Total Bilirubin: 0.3 mg/dL (ref 0.3–1.2)
Total Protein: 8.1 g/dL (ref 6.5–8.1)

## 2019-09-26 NOTE — Progress Notes (Signed)
Provided third complementary case of Ensure [place].

## 2019-09-26 NOTE — Assessment & Plan Note (Signed)
Her pain is well controlled I will continue her pain management We discussed narcotic refill policy

## 2019-09-26 NOTE — Assessment & Plan Note (Signed)
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

## 2019-09-26 NOTE — Telephone Encounter (Signed)
Scheduled appt per 11/2 sch message - pt aware of appts added.

## 2019-09-26 NOTE — Assessment & Plan Note (Signed)
She has persistent pancytopenia despite recent dose adjustment I will order additional work-up including serum vitamin B12, iron studies and folate studies for her next week's blood test Currently, she is not symptomatic and does not need transfusion support

## 2019-09-26 NOTE — Progress Notes (Signed)
[place] OFFICE PROGRESS NOTE  Patient Care Team: Marrian Salvage, [place] as PCP - General (Internal Medicine) Truitt Merle, MD as Consulting Physician (Hematology) Judeth Horn, MD as Consulting Physician (General Surgery) Nat Math, MD as Referring Physician (Obstetrics and Gynecology) Elam Dutch, MD as Consulting Physician (Vascular Surgery) [name], MD as Attending Physician (Gynecologic Oncology) Barbaraann Faster, RN as [place] Heath Lark, MD as Consulting Physician (Hematology and Oncology)  ASSESSMENT & PLAN:  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 [facility]) Unfortunately, she has persistent pancytopenia despite recent dose adjustment I suspect she might have nutritional deficiency along with bone marrow suppressive side effects from chemotherapy I will cancel her treatment today and rescheduled to next week I will also order additional work-up to evaluate for pancytopenia next week After her treatment next week, I will delay cycle 6 and add an extra week of break as well for her final treatment   Pancytopenia, acquired [facility]) She has persistent pancytopenia despite recent dose adjustment I will order additional work-up including serum vitamin B12, iron studies and folate studies for her next week's blood test Currently, she is not symptomatic and does not need transfusion support  Cancer associated pain Her pain is well controlled I will continue her pain management We discussed narcotic refill policy  Physical debility She has persistent neuropathy and weakness at home Physical therapy has been consulted and will do home visit this week and will get her DME as needed   Orders Placed This Encounter  Procedures  . Ferritin    Standing Status:   Future    Standing Expiration Date:   10/30/2020  . Iron and TIBC    Standing Status:   Future    Standing Expiration Date:   10/30/2020  . Vitamin B12    Standing Status:    Future    Standing Expiration Date:   10/30/2020  . Sedimentation rate    Standing Status:   Future    Standing Expiration Date:   10/30/2020  . Folate RBC    Standing Status:   Future    Standing Expiration Date:   10/30/2020    INTERVAL HISTORY: Please see below for problem oriented charting. She returns for cycle 5 of treatment Her neuropathy is stable Her chronic pain is stable She denies recent infection, fever or chills The patient denies any recent signs or symptoms of bleeding such as spontaneous epistaxis, hematuria or hematochezia.   SUMMARY OF ONCOLOGIC HISTORY: Oncology History Overview Note  BRCA1 positive   Cancer of right colon ([facility])  09/16/2014 Imaging   CT abd/pel:  a 3.8cm cecal mass with associated ileocecal intussusception, and a 9.1cm solid and cystic mass in the left pelvis,   09/18/2014 Initial Diagnosis   Adenocarcinoma of right colon   09/18/2014 Pathologic Stage   pT3pN1Mx, tumor extends into pericolonic soft tissue and is less than 68m from the serosal surface, LVI 8-), PNI (-), all 23 node negative, one soft tissue tumor deposit, surgical margins negative.    09/18/2014 Surgery   right hemicolectomy and terminal ileoectomy   08/06/2016 Imaging   CT chest/abd/pelvis with contrast IMPRESSION: 1. No recurrent malignancy identified. 2. 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 On the prior exam from 09/13/2015 there was back flow of contrast in the left external iliac artery to supply the left internal iliac artery ; this back flow of contrast is no longer  present in the left external iliac artery is occluded. 3. Several pulmonary nodules at or below 4 mm diameter, unchanged. 4. 3 mm hypodensity posteriorly in the body of the pancreas, not well seen previously and subtle today, likely incidental, but merits observation. 5. 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 Prominent degenerative spurring of both acetabula. 6. Lumbar spondylosis and degenerative disc disease causing prominent impingement at L4-5 and mild impingement at L5-S1.   03/08/2017 Imaging   CT CAP W Contrast 03/08/17 IMPRESSION: Chest Impression: 1. Stable small pulmonary nodules within LEFT lung. 2. No mediastinal lymphadenopathy. Abdomen / Pelvis Impression: 1. No evidence of metastatic colorectal cancer or ovarian cancer within the abdomen pelvis. 2. No lymphadenopathy. 3. Postsurgical change consistent RIGHT hemicolectomy and hysterectomy 4. A fem-fem arterial bypass noted.   11/13/2017 Imaging   CT AP w contrast IMPRESSION: 1. No findings of recurrent malignancy in the abdomen or pelvis. 2. Other imaging findings of potential clinical significance: Chronic AVN of both femoral heads without contour abnormality. Lumbar impingement at L4-5 and L5-S1. Aortic Atherosclerosis (ICD10-I70.0). 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 Partial right hemicolectomy. Hysterectomy.   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 [facility])  06/16/2019 Tumor Marker   Patient's tumor was tested for the following markers: CA-125 Results of the tumor marker test revealed 499   10/09/2014 Imaging   CT of the abdomen and pelvis showed a 9.1 cm solid and cystic mass in the left pelvis concerning for malignancy. This was discovered during her workup for colon cancer.   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 Tumor size 3.7 cm, no lymph nodes removed. T1cNx. Tumor cells are positive for cytokeratin 7 and estrogen receptor.   11/28/2014 Cancer Staging   Staging form: Ovary, AJCC 7th Edition - Clinical:  Stage III (rT3, N0, M0) - Signed by Heath Lark, MD on 06/09/2019   03/08/2017 Imaging   CT CAP W Contrast 03/08/17 IMPRESSION: Chest Impression: 1. Stable small pulmonary nodules within LEFT lung. 2. No mediastinal lymphadenopathy. Abdomen / Pelvis Impression: 1. No evidence of metastatic colorectal cancer or ovarian cancer within the abdomen pelvis. 2. No lymphadenopathy. 3. Postsurgical change consistent RIGHT hemicolectomy and hysterectomy 4. A fem-fem arterial bypass noted.   03/12/2017 Mammogram   MM Right Breast 03/12/17 IMPRESSION: No mammographic or sonographic evidence of malignancy. No correlate identified for the abnormal enhancement seen within the right nipple on MRI of 02/27/2017.    04/02/2017 Pathology Results   Surgical Pathology of Nipple Biopsy: 04/02/17 Diagnosis Nipple Biopsy, Right - BENIGN NIPPLE TISSUE. - NO ATYPIA OR MALIGNANCY.    11/13/2017 Imaging   1. No findings of recurrent malignancy in the abdomen or pelvis. 2. Other imaging findings of potential clinical significance: Chronic AVN of both femoral heads without contour abnormality. Lumbar impingement at L4-5 and L5-S1. Aortic Atherosclerosis (ICD10-I70.0). 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 Partial right hemicolectomy. Hysterectomy.   03/22/2018 Mammogram   Screening mammogram IMPRESSION No evidence of malignancy.     05/02/2019 Tumor Marker   Patient's tumor was tested for the following markers: CA-125 Results of the tumor marker test revealed 360   05/24/2019 Imaging  CT abdomen and pelvis New peritoneal carcinomatosis in the pelvis and right lower quadrant.   06/06/2019 Imaging   1. Tiny bilateral pulmonary nodules stable since 11/12/2018. Continued close attention on follow-up recommended. 2. Incompletely visualized upper abdominal lymphadenopathy better characterized on the abdomen CT from 2  weeks ago. 3.  Aortic Atherosclerois (ICD10-170.0) 4.  Emphysema. (OIB70-W88.9)   06/07/2019 Pathology Results   Soft Tissue Needle Core Biopsy, peritoneal nodule within right lower abdomen - METASTATIC CARCINOMA CONSISTENT WITH PATIENT'S CLINICAL HISTORY OF PRIMARY OVARIAN CARCINOMA. SEE NOTE Diagnosis Note Immunohistochemical stains show that the tumor cells are positive for CK7, ER and PAX 8; and negative for CK20 and CDX2. This immunostaining profile is consistent with above diagnosis.   06/07/2019 Procedure   Technically successful CT guided core needle biopsy of indeterminate peritoneal nodule within the right lower abdomen/pelvis.   06/20/2019 -  Chemotherapy   The patient had palonosetron (ALOXI) injection 0.25 mg, 0.25 mg, Intravenous,  Once, 4 of 6 cycles Administration: 0.25 mg (06/20/2019), 0.25 mg (07/11/2019), 0.25 mg (08/05/2019), 0.25 mg (09/05/2019) CARBOplatin (PARAPLATIN) 670 mg in sodium chloride 0.9 % 250 mL chemo infusion, 600 mg (100 % of original dose 600 mg), Intravenous,  Once, 4 of 6 cycles Dose modification:   (original dose 600 mg, Cycle 1), 600 mg (original dose 600 mg, Cycle 1), 480 mg (80 % of original dose 600 mg, Cycle 2, Reason: Dose Not Tolerated),   (original dose 600 mg, Cycle 3, Reason: Dose not tolerated), 532 mg (original dose 600 mg, Cycle 3), 551.5 mg (original dose 600 mg, Cycle 4, Reason: Dose not tolerated), 551.5 mg (original dose 600 mg, Cycle 5), 480 mg (original dose 600 mg, Cycle 5, Reason: Other (see comments), Comment: md wants 480 mg) Administration: 670 mg (06/20/2019), 480 mg (07/11/2019), 530 mg (08/05/2019), 530 mg (09/05/2019) PACLitaxel (TAXOL) 252 mg in sodium chloride 0.9 % 250 mL chemo infusion (> 35m/m2), 131.25 mg/m2 = 252 mg (75 % of original dose 175 mg/m2), Intravenous,  Once, 4 of 6 cycles Dose modification: 131.25 mg/m2 (75 % of original dose 175 mg/m2, Cycle 1, Reason: Provider Judgment), 1891.6945mg/m2 (66.7 % of original dose 175  mg/m2, Cycle 4, Reason: Dose Not Tolerated) Administration: 252 mg (06/20/2019), 252 mg (07/11/2019), 246 mg (08/05/2019), 216 mg (09/05/2019) fosaprepitant (EMEND) 150 mg, dexamethasone (DECADRON) 12 mg in sodium chloride 0.9 % 145 mL IVPB, , Intravenous,  Once, 4 of 6 cycles Administration:  (06/20/2019),  (07/11/2019),  (08/05/2019),  (09/05/2019)  for chemotherapy treatment.    07/11/2019 Tumor Marker   Patient's tumor was tested for the following markers: CA-125 Results of the tumor marker test revealed 438   08/05/2019 Tumor Marker   Patient's tumor was tested for the following markers: CA-125 Results of the tumor marker test revealed 272.   08/25/2019 Imaging   Ct chest, abdomen and pelvis 1. Slight interval decrease in size of a right lower quadrant peritoneal nodule measuring 1.7 x 1.6 cm, previously 2.2 x 2.2 cm (series 2, image 94, series 5, image 58). Findings are consistent with treatment response.   2. No significant change in ill-defined soft tissue about the sigmoid colon in the hysterectomy bed (series 2, image 101).   3.  Status post hysterectomy.   4. No evidence of metastatic disease in the chest. Stable benign small pulmonary nodules.   5.  Coronary artery disease.  Aortic Atherosclerosis (ICD10-I70.0).     08/26/2019 Tumor Marker   Patient's tumor was tested for the following  markers: CA-125 Results of the tumor marker test revealed 189.     REVIEW OF SYSTEMS:   Constitutional: Denies fevers, chills or abnormal weight loss Eyes: Denies blurriness of vision Ears, nose, mouth, throat, and face: Denies mucositis or sore throat Respiratory: Denies cough, dyspnea or wheezes Cardiovascular: Denies palpitation, chest discomfort or lower extremity swelling Gastrointestinal:  Denies nausea, heartburn or change in bowel habits Skin: Denies abnormal skin rashes Lymphatics: Denies new lymphadenopathy or easy bruising Neurological:Denies numbness, tingling or new  weaknesses Behavioral/Psych: Mood is stable, no new changes  All other systems were reviewed with the patient and are negative.  I have reviewed the past medical history, past surgical history, social history and family history with the patient and they are unchanged from previous note.  ALLERGIES:  is allergic to other; demerol [meperidine]; morphine and related; oxycodone; penicillins; strawberry flavor; tylenol [acetaminophen]; and ibuprofen.  MEDICATIONS:  Current Outpatient Medications  Medication Sig Dispense Refill  . acetaminophen (TYLENOL) 325 MG tablet Take 325 mg by mouth as needed.    [name] dexamethasone (DECADRON) 4 MG tablet Take 3 tabs at the night before and 3 tab the morning of chemotherapy, every 3 weeks, by mouth for 6 cycles 36 tablet 9  . docusate sodium 100 MG CAPS Take 100 mg by mouth 2 (two) times daily as needed for mild constipation. 10 capsule 0  . HYDROcodone-acetaminophen (NORCO/VICODIN) 5-325 MG tablet Take 1 tablet by mouth every 6 (six) hours as needed for moderate pain. 60 tablet 0  . lidocaine (LIDODERM) 5 % PLACE 1 PATCH ONTO THE SKIN EVERY DAY.REMOVE AND DISCARD PATCH WITHIN 12 HOURS OR AS DIRECTED BY PRESCRIBER 30 patch 1  . LORazepam (ATIVAN) 0.5 MG tablet Take 1 tablet (0.5 mg total) by mouth 3 (three) times daily as needed for anxiety. 10 tablet 0  . losartan (COZAAR) 50 MG tablet Take 1 tablet (50 mg total) by mouth daily. 90 tablet 2  . ondansetron (ZOFRAN) 8 MG tablet Take 1 tablet (8 mg total) by mouth every 8 (eight) hours as needed for nausea or vomiting. 30 tablet 1  . pregabalin (LYRICA) 200 MG capsule Take 1 capsule (200 mg total) by mouth every 8 (eight) hours. Please give patient brand name Lyrica 90 capsule 5  . prochlorperazine (COMPAZINE) 10 MG tablet Take 1 tablet (10 mg total) by mouth every 6 (six) hours as needed (Nausea or vomiting). 30 tablet 1   No current facility-administered medications for this visit.     PHYSICAL  EXAMINATION: ECOG PERFORMANCE STATUS: 1 - Symptomatic but completely ambulatory  Vitals:   09/26/19 0915  BP: (!) 153/84  Pulse: 92  Resp: 18  Temp: 98.2 F (36.8 C)  SpO2: 100%   Filed Weights   09/26/19 0915  Weight: 162 lb 11.2 oz (73.8 kg)    GENERAL:alert, no distress and comfortable SKIN: skin color, texture, turgor are normal, no rashes or significant lesions EYES: normal, Conjunctiva are pink and non-injected, sclera clear OROPHARYNX:no exudate, no erythema and lips, buccal mucosa, and tongue normal  NECK: supple, thyroid normal size, non-tender, without nodularity LYMPH:  no palpable lymphadenopathy in the cervical, axillary or inguinal LUNGS: clear to auscultation and percussion with normal breathing effort HEART: regular rate & rhythm and no murmurs and no lower extremity edema ABDOMEN:abdomen soft, non-tender and normal bowel sounds Musculoskeletal:no cyanosis of digits and no clubbing  NEURO: alert & oriented x 3 with fluent speech, no focal motor/sensory deficits  LABORATORY DATA:  I have reviewed the  data as listed    Component Value Date/Time   NA 140 09/05/2019 0808   NA 141 10/07/2017 1436   K 4.4 09/05/2019 0808   K 4.4 10/07/2017 1436   CL 106 09/05/2019 0808   CO2 22 09/05/2019 0808   CO2 26 10/07/2017 1436   GLUCOSE 126 (H) 09/05/2019 0808   GLUCOSE 85 10/07/2017 1436   BUN 16 09/05/2019 0808   BUN 14.9 10/07/2017 1436   CREATININE 0.83 09/05/2019 0808   CREATININE 1.0 10/07/2017 1436   CALCIUM 9.5 09/05/2019 0808   CALCIUM 9.5 10/07/2017 1436   PROT 7.3 09/05/2019 0808   PROT 7.8 10/07/2017 1436   ALBUMIN 3.9 09/05/2019 0808   ALBUMIN 3.9 10/07/2017 1436   AST 14 (L) 09/05/2019 0808   AST 15 10/07/2017 1436   ALT 7 09/05/2019 0808   ALT 7 10/07/2017 1436   ALKPHOS 67 09/05/2019 0808   ALKPHOS 81 10/07/2017 1436   BILITOT 0.3 09/05/2019 0808   BILITOT 0.54 10/07/2017 1436   GFRNONAA >60 09/05/2019 0808   GFRAA >60 09/05/2019 0808     No results found for: SPEP, UPEP  Lab Results  Component Value Date   WBC 3.3 (L) 09/26/2019   NEUTROABS 2.4 09/26/2019   HGB 10.4 (L) 09/26/2019   HCT 28.7 (L) 09/26/2019   MCV 107.1 (H) 09/26/2019   PLT 59 (L) 09/26/2019      Chemistry      Component Value Date/Time   NA 140 09/05/2019 0808   NA 141 10/07/2017 1436   K 4.4 09/05/2019 0808   K 4.4 10/07/2017 1436   CL 106 09/05/2019 0808   CO2 22 09/05/2019 0808   CO2 26 10/07/2017 1436   BUN 16 09/05/2019 0808   BUN 14.9 10/07/2017 1436   CREATININE 0.83 09/05/2019 0808   CREATININE 1.0 10/07/2017 1436      Component Value Date/Time   CALCIUM 9.5 09/05/2019 0808   CALCIUM 9.5 10/07/2017 1436   ALKPHOS 67 09/05/2019 0808   ALKPHOS 81 10/07/2017 1436   AST 14 (L) 09/05/2019 0808   AST 15 10/07/2017 1436   ALT 7 09/05/2019 0808   ALT 7 10/07/2017 1436   BILITOT 0.3 09/05/2019 0808   BILITOT 0.54 10/07/2017 1436       All questions were answered. The patient knows to call the clinic with any problems, questions or concerns. No barriers to learning was detected.  I spent 25 minutes counseling the patient face to face. The total time spent in the appointment was 30 minutes and more than 50% was on counseling and review of test results  Heath Lark, MD 09/26/2019 9:34 AM

## 2019-09-26 NOTE — Assessment & Plan Note (Signed)
Unfortunately, she has persistent pancytopenia despite recent dose adjustment I suspect she might have nutritional deficiency along with bone marrow suppressive side effects from chemotherapy I will cancel her treatment today and rescheduled to next week I will also order additional work-up to evaluate for pancytopenia next week After her treatment next week, I will delay cycle 6 and add an extra week of break as well for her final treatment

## 2019-09-26 NOTE — Telephone Encounter (Signed)
She called and left a message to call her about taking vitamins.  Called back and left a message per Dr. Alvy Bimler it is okay to take vitamins.

## 2019-09-27 LAB — CA 125: Cancer Antigen (CA) 125: 137 U/mL — ABNORMAL HIGH (ref 0.0–38.1)

## 2019-09-28 DIAGNOSIS — G893 Neoplasm related pain (acute) (chronic): Secondary | ICD-10-CM | POA: Diagnosis not present

## 2019-09-28 DIAGNOSIS — Z9181 History of falling: Secondary | ICD-10-CM | POA: Diagnosis not present

## 2019-09-28 DIAGNOSIS — C562 Malignant neoplasm of left ovary: Secondary | ICD-10-CM | POA: Diagnosis not present

## 2019-09-28 DIAGNOSIS — D61810 Antineoplastic chemotherapy induced pancytopenia: Secondary | ICD-10-CM | POA: Diagnosis not present

## 2019-09-28 DIAGNOSIS — Z79891 Long term (current) use of opiate analgesic: Secondary | ICD-10-CM | POA: Diagnosis not present

## 2019-09-28 DIAGNOSIS — G609 Hereditary and idiopathic neuropathy, unspecified: Secondary | ICD-10-CM | POA: Diagnosis not present

## 2019-09-29 ENCOUNTER — Other Ambulatory Visit: Payer: Self-pay | Admitting: *Deleted

## 2019-09-29 ENCOUNTER — Telehealth: Payer: Self-pay

## 2019-09-29 NOTE — Telephone Encounter (Signed)
She called and requested refill on Hydrocodone Rx.

## 2019-09-29 NOTE — Patient Outreach (Signed)
[facility]) Care Management  09/29/2019  Bryella Diviney Apr 19, 1961 [id number]   Call attempt for Follow up and further assessment for EMMI Join Patient/Complex case patient  Referral Date:08/18/19 from [place] score of 8- Complex CM [place] access  First unsuccessful Outreach attempt this week to [facility] engaged patient No answer. [facility] RN CM left HIPAA compliant voicemail message along with CM's contact info.   Plan: [facility] RN CM  scheduled this patient for another call attempt within 4-7  business days   Nariyah Osias L. Lavina Hamman, RN, BSN, [place] Coordinator Office number [phone] Mobile number [phone]  Main [facility] number [phone] Fax number [phone]

## 2019-09-30 ENCOUNTER — Other Ambulatory Visit: Payer: Self-pay | Admitting: Hematology and Oncology

## 2019-09-30 DIAGNOSIS — G609 Hereditary and idiopathic neuropathy, unspecified: Secondary | ICD-10-CM | POA: Diagnosis not present

## 2019-09-30 DIAGNOSIS — Z79891 Long term (current) use of opiate analgesic: Secondary | ICD-10-CM | POA: Diagnosis not present

## 2019-09-30 DIAGNOSIS — D61810 Antineoplastic chemotherapy induced pancytopenia: Secondary | ICD-10-CM | POA: Diagnosis not present

## 2019-09-30 DIAGNOSIS — G893 Neoplasm related pain (acute) (chronic): Secondary | ICD-10-CM | POA: Diagnosis not present

## 2019-09-30 DIAGNOSIS — Z9181 History of falling: Secondary | ICD-10-CM | POA: Diagnosis not present

## 2019-09-30 DIAGNOSIS — C562 Malignant neoplasm of left ovary: Secondary | ICD-10-CM | POA: Diagnosis not present

## 2019-09-30 MED ORDER — HYDROCODONE-ACETAMINOPHEN 5-325 MG PO TABS: 1.0000 | ORAL_TABLET | Freq: Four times a day (QID) | ORAL | 0 refills | Status: DC | PRN

## 2019-09-30 NOTE — Telephone Encounter (Signed)
done

## 2019-10-03 ENCOUNTER — Inpatient Hospital Stay: Payer: Medicare Other

## 2019-10-03 ENCOUNTER — Other Ambulatory Visit: Payer: Self-pay

## 2019-10-03 ENCOUNTER — Other Ambulatory Visit: Payer: Self-pay | Admitting: Hematology and Oncology

## 2019-10-03 VITALS — BP 141/85 | HR 98 | Temp 98.0°F | Resp 18

## 2019-10-03 DIAGNOSIS — Z5111 Encounter for antineoplastic chemotherapy: Secondary | ICD-10-CM | POA: Diagnosis not present

## 2019-10-03 DIAGNOSIS — D61818 Other pancytopenia: Secondary | ICD-10-CM | POA: Diagnosis not present

## 2019-10-03 DIAGNOSIS — G893 Neoplasm related pain (acute) (chronic): Secondary | ICD-10-CM | POA: Diagnosis not present

## 2019-10-03 DIAGNOSIS — C182 Malignant neoplasm of ascending colon: Secondary | ICD-10-CM | POA: Diagnosis not present

## 2019-10-03 DIAGNOSIS — C562 Malignant neoplasm of left ovary: Secondary | ICD-10-CM | POA: Diagnosis not present

## 2019-10-03 DIAGNOSIS — D539 Nutritional anemia, unspecified: Secondary | ICD-10-CM

## 2019-10-03 DIAGNOSIS — C786 Secondary malignant neoplasm of retroperitoneum and peritoneum: Secondary | ICD-10-CM | POA: Diagnosis not present

## 2019-10-03 LAB — CMP (CANCER CENTER ONLY)
ALT: 24 U/L (ref 0–44)
AST: 20 U/L (ref 15–41)
Albumin: 4.3 g/dL (ref 3.5–5.0)
Alkaline Phosphatase: 77 U/L (ref 38–126)
Anion gap: 13 (ref 5–15)
BUN: 16 mg/dL (ref 6–20)
CO2: 26 mmol/L (ref 22–32)
Calcium: 10.1 mg/dL (ref 8.9–10.3)
Chloride: 103 mmol/L (ref 98–111)
Creatinine: 0.94 mg/dL (ref 0.44–1.00)
GFR, Est AFR Am: >60 mL/min (ref >60)
GFR, Estimated: >60 mL/min (ref >60)
Glucose, Bld: 144 mg/dL — ABNORMAL HIGH (ref 70–99)
Potassium: 4.4 mmol/L (ref 3.5–5.1)
Sodium: 142 mmol/L (ref 135–145)
Total Bilirubin: 0.3 mg/dL (ref 0.3–1.2)
Total Protein: 8 g/dL (ref 6.5–8.1)

## 2019-10-03 LAB — SEDIMENTATION RATE: Sed Rate: 70 mm/hr — ABNORMAL HIGH (ref 0–22)

## 2019-10-03 LAB — CBC WITH DIFFERENTIAL (CANCER CENTER ONLY)
Abs Immature Granulocytes: 0.01 10*3/uL (ref 0.00–0.07)
Basophils Absolute: 0 10*3/uL (ref 0.0–0.1)
Basophils Relative: 0 %
Eosinophils Absolute: 0 10*3/uL (ref 0.0–0.5)
Eosinophils Relative: 0 %
HCT: 28.9 % — ABNORMAL LOW (ref 36.0–46.0)
Hemoglobin: 10.1 g/dL — ABNORMAL LOW (ref 12.0–15.0)
Immature Granulocytes: 0 %
Lymphocytes Relative: 23 %
Lymphs Abs: 0.5 10*3/uL — ABNORMAL LOW (ref 0.7–4.0)
MCH: 39 pg — ABNORMAL HIGH (ref 26.0–34.0)
MCHC: 34.9 g/dL (ref 30.0–36.0)
MCV: 111.6 fL — ABNORMAL HIGH (ref 80.0–100.0)
Monocytes Absolute: 0.1 10*3/uL (ref 0.1–1.0)
Monocytes Relative: 2 %
Neutro Abs: 1.7 10*3/uL (ref 1.7–7.7)
Neutrophils Relative %: 75 %
Platelet Count: 84 10*3/uL — ABNORMAL LOW (ref 150–400)
RBC: 2.59 MIL/uL — ABNORMAL LOW (ref 3.87–5.11)
RDW: 17.3 % — ABNORMAL HIGH (ref 11.5–15.5)
WBC Count: 2.3 10*3/uL — ABNORMAL LOW (ref 4.0–10.5)
nRBC: 0 % (ref 0.0–0.2)

## 2019-10-03 LAB — IRON AND TIBC
Iron: 90 ug/dL (ref 41–142)
Saturation Ratios: 26 % (ref 21–57)
TIBC: 349 ug/dL (ref 236–444)
UIBC: 259 ug/dL (ref 120–384)

## 2019-10-03 LAB — VITAMIN B12: Vitamin B-12: 469 pg/mL (ref 180–914)

## 2019-10-03 LAB — FERRITIN: Ferritin: 244 ng/mL (ref 11–307)

## 2019-10-03 MED ORDER — SODIUM CHLORIDE 0.9 % IV SOLN
480.0000 mg | Freq: Once | INTRAVENOUS | Status: AC
  Administered 2019-10-03: 480 mg via INTRAVENOUS
  Filled 2019-10-03: qty 48

## 2019-10-03 MED ORDER — SODIUM CHLORIDE 0.9 % IV SOLN
Freq: Once | INTRAVENOUS | Status: AC
  Administered 2019-10-03: 11:00:00 via INTRAVENOUS
  Filled 2019-10-03: qty 250

## 2019-10-03 MED ORDER — DIPHENHYDRAMINE HCL 50 MG/ML IJ SOLN
INTRAMUSCULAR | Status: AC
  Filled 2019-10-03: qty 1

## 2019-10-03 MED ORDER — FAMOTIDINE IN NACL 20-0.9 MG/50ML-% IV SOLN
20.0000 mg | Freq: Once | INTRAVENOUS | Status: AC
  Administered 2019-10-03: 20 mg via INTRAVENOUS

## 2019-10-03 MED ORDER — DIPHENHYDRAMINE HCL 50 MG/ML IJ SOLN
50.0000 mg | Freq: Once | INTRAMUSCULAR | Status: AC
  Administered 2019-10-03: 50 mg via INTRAVENOUS

## 2019-10-03 MED ORDER — SODIUM CHLORIDE 0.9 % IV SOLN
Freq: Once | INTRAVENOUS | Status: AC
  Administered 2019-10-03: 12:00:00 via INTRAVENOUS
  Filled 2019-10-03: qty 5

## 2019-10-03 MED ORDER — SODIUM CHLORIDE 0.9 % IV SOLN
116.6667 mg/m2 | Freq: Once | INTRAVENOUS | Status: AC
  Administered 2019-10-03: 216 mg via INTRAVENOUS
  Filled 2019-10-03: qty 36

## 2019-10-03 MED ORDER — PALONOSETRON HCL INJECTION 0.25 MG/5ML
0.2500 mg | Freq: Once | INTRAVENOUS | Status: AC
  Administered 2019-10-03: 0.25 mg via INTRAVENOUS

## 2019-10-03 MED ORDER — FAMOTIDINE IN NACL 20-0.9 MG/50ML-% IV SOLN
INTRAVENOUS | Status: AC
  Filled 2019-10-03: qty 50

## 2019-10-03 MED ORDER — PALONOSETRON HCL INJECTION 0.25 MG/5ML
INTRAVENOUS | Status: AC
  Filled 2019-10-03: qty 5

## 2019-10-03 NOTE — Patient Instructions (Addendum)
[place] Discharge Instructions for Patients Receiving Chemotherapy  Today you received the following chemotherapy agents Paclitaxel (TAXOL) & Carboplatin (PARAPLATIN).  To help prevent nausea and vomiting after your treatment, we encourage you to take your nausea medication as prescribed.   If you develop nausea and vomiting that is not controlled by your nausea medication, call the clinic.   BELOW ARE SYMPTOMS THAT SHOULD BE REPORTED IMMEDIATELY:  *FEVER GREATER THAN 100.5 F  *CHILLS WITH OR WITHOUT FEVER  NAUSEA AND VOMITING THAT IS NOT CONTROLLED WITH YOUR NAUSEA MEDICATION  *UNUSUAL SHORTNESS OF BREATH  *UNUSUAL BRUISING OR BLEEDING  TENDERNESS IN MOUTH AND THROAT WITH OR WITHOUT PRESENCE OF ULCERS  *URINARY PROBLEMS  *BOWEL PROBLEMS  UNUSUAL RASH Items with * indicate a potential emergency and should be followed up as soon as possible.  Feel free to call the clinic should you have any questions or concerns. The clinic phone number is [phone].  Please show the [place] at check-in to the Emergency Department and triage nurse.  Coronavirus (COVID-19) Are you at risk?  Are you at risk for the Coronavirus (COVID-19)?  To be considered HIGH RISK for Coronavirus (COVID-19), you have to meet the following criteria:  . Traveled to [place], [place], [place], [place] or [place]; or in the [place] to [place], [place], [place], or [place]; and have fever, cough, and shortness of breath within the last 2 weeks of travel OR . Been in close contact with a person diagnosed with COVID-19 within the last 2 weeks and have fever, cough, and shortness of breath . IF YOU DO NOT MEET THESE CRITERIA, YOU ARE CONSIDERED LOW RISK FOR COVID-19.  What to do if you are HIGH RISK for COVID-19?  [name] If you are having a medical emergency, call 911. . Seek medical care right away. Before you go to a doctor's office, urgent care or emergency department,  call ahead and tell them about your recent travel, contact with someone diagnosed with COVID-19, and your symptoms. You should receive instructions from your physician's office regarding next steps of care.  . When you arrive at healthcare provider, tell the healthcare staff immediately you have returned from visiting [place], [place], [place], [place] or [place]; or traveled in the [place] to [place], [place], [place], or [place]; in the last two weeks or you have been in close contact with a person diagnosed with COVID-19 in the last 2 weeks.   . Tell the health care staff about your symptoms: fever, cough and shortness of breath. . After you have been seen by a medical provider, you will be either: o Tested for (COVID-19) and discharged home on quarantine except to seek medical care if symptoms worsen, and asked to  - Stay home and avoid contact with others until you get your results (4-5 days)  - Avoid travel on public transportation if possible (such as bus, train, or airplane) or o Sent to the Emergency Department by EMS for evaluation, COVID-19 testing, and possible admission depending on your condition and test results.  What to do if you are LOW RISK for COVID-19?  Reduce your risk of any infection by using the same precautions used for avoiding the common cold or flu:  [name] Wash your hands often with soap and warm water for at least 20 seconds.  If soap and water are not readily available, use an alcohol-based hand sanitizer with at least 60% alcohol.  [name]  If coughing or sneezing, cover your mouth and nose by coughing or sneezing into the elbow areas of your shirt or coat, into a tissue or into your sleeve (not your hands). . Avoid shaking hands with others and consider head nods or verbal greetings only. . Avoid touching your eyes, nose, or mouth with unwashed hands.  . Avoid close contact with people who are sick. . Avoid places or events with large numbers of people in one  location, like concerts or sporting events. . Carefully consider travel plans you have or are making. . If you are planning any travel outside or inside the [place], visit the CDC's Travelers' Health webpage for the latest health notices. . If you have some symptoms but not all symptoms, continue to monitor at home and seek medical attention if your symptoms worsen. . If you are having a medical emergency, call 911.   [place] / e-Visit: [email or web address]         [facility]: [facility]: [phone]                   [facility]: [phone]    Thrombocytopenia Thrombocytopenia means that you have a low number of platelets in your blood. Platelets are tiny cells in the blood. When you bleed, they clump together at the cut or injury to stop the bleeding. This is called blood clotting. If you do not have enough platelets, it can cause bleeding problems. Some cases of this condition are mild while others are more severe. What are the causes? This condition may be caused by:  Your body not making enough platelets. This may be caused by: ? Your bone marrow not making blood cells (aplastic anemia). ? Cancer in the bone marrow. ? Certain medicines. ? Infection in the bone marrow. ? Drinking a lot of alcohol.  Your body destroying platelets too quickly. This may be caused by: ? Certain immune diseases. ? Certain medicines. ? Certain blood clotting disorders. ? Certain disorders that are passed from parent to child (inherited). ? Certain bleeding disorders. ? Pregnancy. ? Having a spleen that is larger than normal. What are the signs or symptoms?  Bleeding that is not normal.  Nosebleeds.  Heavy menstrual periods.  Blood in the pee (urine) or poop (stool).  A purple-like color to the skin (purpura).  Bruising.  A rash that  looks like pinpoint, purple-red spots (petechiae). How is this treated?  Treatment of another condition that is causing the low platelet count.  Medicines to help protect your platelets from being destroyed.  A replacement (transfusion) of platelets to stop or prevent bleeding.  Surgery to remove the spleen. Follow these instructions at home: Activity  Avoid activities that could cause you to get hurt or bruised. Follow instructions about how to prevent falls.  Take care not to cut yourself: ? When you shave. ? When you use scissors, needles, knives, or other tools.  Take care not to burn yourself: ? When you use an iron. ? When you cook. General instructions   Check your skin and the inside of your mouth for bruises or blood as told by your doctor.  Check to see if there is blood in your spit (sputum), pee, and poop. Do this as told by your doctor.  Do not drink alcohol.  Take over-the-counter and prescription medicines only as told by your doctor.  Do not take any medicines  that have aspirin or NSAIDs in them. These medicines can thin your blood and cause you to bleed.  Tell all of your doctors that you have this condition. Be sure to tell your dentist and eye doctor too. Contact a doctor if:  You have bruises and you do not know why. Get help right away if:  You are bleeding anywhere on your body.  You have blood in your spit, pee, or poop. Summary  Thrombocytopenia means that you have a low number of platelets in your blood.  Platelets are needed for blood clotting.  Symptoms of this condition include bleeding that is not normal, and bruising.  Take care not to cut or burn yourself. This information is not intended to replace advice given to you by your health care provider. Make sure you discuss any questions you have with your health care provider. Document Released: 10/30/2011 Document Revised: 08/12/2018 Document Reviewed: 08/12/2018 Elsevier Patient  Education  2020 [employer].

## 2019-10-03 NOTE — Progress Notes (Signed)
Per Dr Alvy Bimler ok to tx with platelets 85 today.

## 2019-10-04 LAB — FOLATE RBC
Folate, Hemolysate: 427 ng/mL
Folate, RBC: 1483 ng/mL (ref >498)
Hematocrit: 28.8 % — ABNORMAL LOW (ref 34.0–46.6)

## 2019-10-04 LAB — CA 125: Cancer Antigen (CA) 125: 137 U/mL — ABNORMAL HIGH (ref 0.0–38.1)

## 2019-10-05 ENCOUNTER — Other Ambulatory Visit: Payer: Self-pay | Admitting: *Deleted

## 2019-10-05 NOTE — Patient Outreach (Signed)
[facility]) Care Management  10/05/2019  Catherine Rogers [birth year] [id number]   Call attempt for Follow up and further assessment for EMMI Join Patient/Complex case patient  Referral Date:08/18/19 from [place] score of 8- Complex CM [place] access  Second unsuccessful Outreach attempt this week to [facility] engaged patient No answer. [facility] RN CM left HIPAA compliant voicemail message along with CM's contact info.  Plan: [facility] RN CM  scheduled this patient for another call attempt within 7-14  business days [facility] RN CM sent an unsuccessful outreach letter to this [facility] engaged patient    Joelene Millin L. Lavina Hamman, RN, BSN, [place] Coordinator Office number [phone] Mobile number [phone]  Main [facility] number [phone] Fax number [phone]

## 2019-10-05 NOTE — Patient Outreach (Signed)
[facility]) Care Management  10/05/2019  Catherine Rogers Jul 18, 1961 [id number]   Call attempt forFollow up and further assessment for EMMI Join Patient/Complex case patient  Referral Date:9/24/20fromEMMI Joinwith score of 8- Complex CM [place] access  Catherine Rogers left a message for [facility] RN CM  [facility] RN CM returned her call No answer. [facility] RN CM left HIPAA compliant voicemail message along with CM's contact info.  Plan: [facility] RN CM scheduled this patient for another call attempt within 7-14 business days [facility] RN CM sent an unsuccessful outreach letter to this [facility] engaged patient    Joelene Millin L. Lavina Hamman, RN, BSN, [place] Coordinator Office number [phone] Mobile number [phone]  Main [facility] number [phone] Fax number [phone]

## 2019-10-07 DIAGNOSIS — Z9181 History of falling: Secondary | ICD-10-CM | POA: Diagnosis not present

## 2019-10-07 DIAGNOSIS — C562 Malignant neoplasm of left ovary: Secondary | ICD-10-CM | POA: Diagnosis not present

## 2019-10-07 DIAGNOSIS — G893 Neoplasm related pain (acute) (chronic): Secondary | ICD-10-CM | POA: Diagnosis not present

## 2019-10-07 DIAGNOSIS — D61810 Antineoplastic chemotherapy induced pancytopenia: Secondary | ICD-10-CM | POA: Diagnosis not present

## 2019-10-07 DIAGNOSIS — G609 Hereditary and idiopathic neuropathy, unspecified: Secondary | ICD-10-CM | POA: Diagnosis not present

## 2019-10-07 DIAGNOSIS — Z79891 Long term (current) use of opiate analgesic: Secondary | ICD-10-CM | POA: Diagnosis not present

## 2019-10-10 DIAGNOSIS — G609 Hereditary and idiopathic neuropathy, unspecified: Secondary | ICD-10-CM | POA: Diagnosis not present

## 2019-10-10 DIAGNOSIS — C562 Malignant neoplasm of left ovary: Secondary | ICD-10-CM | POA: Diagnosis not present

## 2019-10-10 DIAGNOSIS — G893 Neoplasm related pain (acute) (chronic): Secondary | ICD-10-CM | POA: Diagnosis not present

## 2019-10-10 DIAGNOSIS — D61810 Antineoplastic chemotherapy induced pancytopenia: Secondary | ICD-10-CM | POA: Diagnosis not present

## 2019-10-10 DIAGNOSIS — Z9181 History of falling: Secondary | ICD-10-CM | POA: Diagnosis not present

## 2019-10-10 DIAGNOSIS — Z79891 Long term (current) use of opiate analgesic: Secondary | ICD-10-CM | POA: Diagnosis not present

## 2019-10-11 ENCOUNTER — Ambulatory Visit: Payer: Medicare Other

## 2019-10-12 ENCOUNTER — Other Ambulatory Visit: Payer: Self-pay | Admitting: *Deleted

## 2019-10-12 NOTE — Patient Outreach (Signed)
[facility]) Care Management  10/12/2019  Catherine Rogers [birth year] [id number]   Third Call attempt forFollow up and further assessment for EMMI Join Patient/Complex case patient  Referral Date:9/24/20fromEMMI Joinwith score of 8- Complex CM [place] access  3rd Outreach attempt No answer. [facility] RN CM left HIPAA compliant voicemail message along with CM's contact info.  Plan: [facility] RN CM sent an unsuccessful outreach letter to this [facility] engaged patienton 10/05/19 Plan for case closure if no return call from patient  [place]. Lavina Hamman, RN, BSN, [place] Coordinator Office number [phone] Mobile number [phone]  Main [facility] number [phone]

## 2019-10-13 ENCOUNTER — Other Ambulatory Visit: Payer: Self-pay | Admitting: *Deleted

## 2019-10-13 ENCOUNTER — Telehealth: Payer: Self-pay

## 2019-10-13 ENCOUNTER — Other Ambulatory Visit: Payer: Self-pay | Admitting: Hematology and Oncology

## 2019-10-13 MED ORDER — HYDROCODONE-ACETAMINOPHEN 5-325 MG PO TABS: 1.0000 | ORAL_TABLET | Freq: Four times a day (QID) | ORAL | 0 refills | Status: DC | PRN

## 2019-10-13 NOTE — Patient Outreach (Signed)
[facility]) Care Management  10/13/2019  [name] [birth year] [id number]   Care coordination  [facility] RN CM received a voice message from Mrs Conant after [facility] RN CM left message on  Mr [place] stated in her voice message that she was calling [facility] RN CM to let CM know she was "okay"   [facility] RN CM returned the call.  No answer. [facility] RN CM  Left a HIPAA compliant voice message along with CM's contact information and a review of unsuccessful outreach workflow conclusion. This was the 4th call attempt to follow up with EMMI Join -complex case referred patient. An unsuccessful outreach letter was sent on 10/05/19 to this [facility] engaged patient  Plan for case closure if no contact with patient as reviewed in the voice message left.   [name], RN, BSN, [place] Coordinator Office number [phone] Mobile number [phone]  Main [facility] number [phone] Fax number [phone]

## 2019-10-13 NOTE — Telephone Encounter (Signed)
done

## 2019-10-13 NOTE — Telephone Encounter (Signed)
Pt notified, no further needs.

## 2019-10-13 NOTE — Telephone Encounter (Signed)
Pt requesting refill on Hydrocodone-acetaminophen 5/325mg  tablets, 1 tablet PO Q 6 hours as needed for pain.  Last RX on 11/6, #60 dispensed.

## 2019-10-18 ENCOUNTER — Telehealth: Payer: Self-pay

## 2019-10-18 ENCOUNTER — Other Ambulatory Visit: Payer: Self-pay | Admitting: *Deleted

## 2019-10-18 NOTE — Patient Outreach (Signed)
[facility]) Care Management  10/18/2019  Alessa Mazur Nov 29, 1960 [id number]   Case closure   Call attempts made on 09/29/19, 10/05/19, 10/12/19, 10/13/19 and 10/18/19 Unsuccessful outreach letter sent on 10/05/19  without a response   Plan [facility] RN CM will close case after no response from patient within 10 business days. Unable to reach Case closure letters sent to patient   Joelene Millin L. Lavina Hamman, RN, BSN, [place] Coordinator Office number [phone] Mobile number [phone]  Main [facility] number [phone] Fax number [phone]

## 2019-10-18 NOTE — Telephone Encounter (Signed)
She called and left a message to call her.  Called back. PT from [facility] was supposed to come out today but she canceled them today. She is worried about them being in her home after watching the governor on TV talk about COVID. She has been waiting on shower chair for 1 month. She has SCD machine and needs the sleeves for her legs. She is asking if Dr. Alvy Bimler can order them both?

## 2019-10-18 NOTE — Patient Outreach (Signed)
[facility]) Care Management  10/18/2019  Catherine Rogers Dec 02, 1960 [id number]  Care coordination - Return call from patient   Return call from Catherine Rogers HIPAA verified She reports she is doing well    She was having [place] [facility] in the home after her last Chemo tx and is now feeling uncomfortable with having Catherine Rogers staff in her home related to covid pandemic concerns She also reports still awaiting DME from her provider  [facility] RN CM discussed with her the Catherine Rogers call attempt workflow and the case closure letter sent  [facility] RN CM encouraged her to communicate with her Catherine Rogers Rogers agency about her concerns with care in her home at this time related to the covid pandemic and the agency can work with her on arrangements. Reviewed in home and outpatient therapies general processes   [facility] RN CM encouraged her to speak with her oncology MD about the DME needs (shower bench, compression sleeve) [facility] RN CM has called, sent messages and spoken with her medical staff about the DME requests When Rogers was last seen by provider she discussed that she is okay and items not needed or " is not as serious" as when discussed with [facility] RN CM. She admits to this  She reports she will speak with her oncology provider about her needed DME via phone before or on the next date of her chemo tx  Her next chemo tx is on 10/31/19   . 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, cancer of right colon  Role Documenting the Problem One  Care Management Telephonic Coordinator  Care Plan for Problem One  Active  Catherine Rogers Catherine Term Goal   over the next 60 days the patient will verbalize improvements in home care after cancer treatments related to DME, home improvements and support system  Catherine Rogers Catherine Term Goal Start Date  08/19/19  Catherine Rogers CM Short Term Goal #1   over the next 10 days patient will receive contact from Catherine Rogers SW for personal care  services options and possible options for home improvements in bathroom  Catherine Rogers CM Short Term Goal #1 Start Date  08/19/19  Catherine Rogers CM Short Term Goal #1 Met Date  09/08/19  Catherine Rogers CM Short Term Goal #2   over the next 14 days patient will have available assistance from DME provider for Rolator and shower bench with back  Catherine Rogers CM Short Term Goal #2 Start Date  08/19/19  Catherine Rogers CM Short Term Goal #2 Met Date  10/18/19  Interventions for Short Term Goal #2  Patient to speak with her MD about DME   Catherine Rogers CM Short Term Goal #3  over then next 20 days patient will have intervention or DME to assist with with left arm pain as evidence by her verbalizing during follow up call   Catherine Rogers CM Short Term Goal #3 Start Date  09/08/19  Catherine Rogers CM Short Term Goal #3 Met Date  10/18/19  Interventions for Short Tern Goal #3  Patient to speak with her MD about DME       Catherine Millin L. Lavina Hamman, RN, BSN, [place] Coordinator Office number [phone] Mobile number [phone]  Main Catherine Rogers number [phone] Fax number [phone]

## 2019-10-19 ENCOUNTER — Ambulatory Visit: Payer: Self-pay | Admitting: *Deleted

## 2019-10-19 ENCOUNTER — Telehealth: Payer: Self-pay

## 2019-10-19 NOTE — Telephone Encounter (Signed)
Prescription for DME faxed to [place] at [phone].

## 2019-10-19 NOTE — Telephone Encounter (Signed)
The purpose of PT is because [facility] nurse says she is high risks for falls. While shower chair helps, it does not prevent falls or improve her strength to reduce risk of falls All health care personnel are screened and PT coming to home is the safest route to get her strength training that she needs I can order shower chair but I do not know why she needs SCD machine. That was not prescribed by me

## 2019-10-31 ENCOUNTER — Inpatient Hospital Stay: Payer: Medicare Other | Attending: Hematology | Admitting: Hematology and Oncology

## 2019-10-31 ENCOUNTER — Other Ambulatory Visit: Payer: Self-pay

## 2019-10-31 ENCOUNTER — Encounter: Payer: Self-pay | Admitting: Hematology and Oncology

## 2019-10-31 ENCOUNTER — Inpatient Hospital Stay: Payer: Medicare Other

## 2019-10-31 VITALS — BP 152/84 | HR 98 | Temp 98.2°F | Resp 18 | Ht 67.0 in | Wt 165.0 lb

## 2019-10-31 DIAGNOSIS — C182 Malignant neoplasm of ascending colon: Secondary | ICD-10-CM | POA: Insufficient documentation

## 2019-10-31 DIAGNOSIS — I779 Disorder of arteries and arterioles, unspecified: Secondary | ICD-10-CM | POA: Diagnosis not present

## 2019-10-31 DIAGNOSIS — M79661 Pain in right lower leg: Secondary | ICD-10-CM | POA: Diagnosis not present

## 2019-10-31 DIAGNOSIS — R5381 Other malaise: Secondary | ICD-10-CM | POA: Diagnosis not present

## 2019-10-31 DIAGNOSIS — C562 Malignant neoplasm of left ovary: Secondary | ICD-10-CM

## 2019-10-31 DIAGNOSIS — R109 Unspecified abdominal pain: Secondary | ICD-10-CM | POA: Insufficient documentation

## 2019-10-31 DIAGNOSIS — Z79899 Other long term (current) drug therapy: Secondary | ICD-10-CM | POA: Insufficient documentation

## 2019-10-31 DIAGNOSIS — Z5111 Encounter for antineoplastic chemotherapy: Secondary | ICD-10-CM | POA: Insufficient documentation

## 2019-10-31 DIAGNOSIS — D61818 Other pancytopenia: Secondary | ICD-10-CM | POA: Insufficient documentation

## 2019-10-31 DIAGNOSIS — C786 Secondary malignant neoplasm of retroperitoneum and peritoneum: Secondary | ICD-10-CM | POA: Diagnosis not present

## 2019-10-31 DIAGNOSIS — M79662 Pain in left lower leg: Secondary | ICD-10-CM | POA: Diagnosis not present

## 2019-10-31 LAB — COMPREHENSIVE METABOLIC PANEL
ALT: 10 U/L (ref 0–44)
AST: 18 U/L (ref 15–41)
Albumin: 4.3 g/dL (ref 3.5–5.0)
Alkaline Phosphatase: 73 U/L (ref 38–126)
Anion gap: 12 (ref 5–15)
BUN: 14 mg/dL (ref 6–20)
CO2: 25 mmol/L (ref 22–32)
Calcium: 10 mg/dL (ref 8.9–10.3)
Chloride: 105 mmol/L (ref 98–111)
Creatinine, Ser: 0.83 mg/dL (ref 0.44–1.00)
GFR calc Af Amer: >60 mL/min (ref >60)
GFR calc non Af Amer: >60 mL/min (ref >60)
Glucose, Bld: 131 mg/dL — ABNORMAL HIGH (ref 70–99)
Potassium: 4.2 mmol/L (ref 3.5–5.1)
Sodium: 142 mmol/L (ref 135–145)
Total Bilirubin: 0.4 mg/dL (ref 0.3–1.2)
Total Protein: 8.2 g/dL — ABNORMAL HIGH (ref 6.5–8.1)

## 2019-10-31 LAB — CBC WITH DIFFERENTIAL (CANCER CENTER ONLY)
Abs Immature Granulocytes: 0.01 10*3/uL (ref 0.00–0.07)
Basophils Absolute: 0 10*3/uL (ref 0.0–0.1)
Basophils Relative: 0 %
Eosinophils Absolute: 0 10*3/uL (ref 0.0–0.5)
Eosinophils Relative: 0 %
HCT: 30 % — ABNORMAL LOW (ref 36.0–46.0)
Hemoglobin: 10.8 g/dL — ABNORMAL LOW (ref 12.0–15.0)
Immature Granulocytes: 1 %
Lymphocytes Relative: 27 %
Lymphs Abs: 0.6 10*3/uL — ABNORMAL LOW (ref 0.7–4.0)
MCH: 40 pg — ABNORMAL HIGH (ref 26.0–34.0)
MCHC: 36 g/dL (ref 30.0–36.0)
MCV: 111.1 fL — ABNORMAL HIGH (ref 80.0–100.0)
Monocytes Absolute: 0.1 10*3/uL (ref 0.1–1.0)
Monocytes Relative: 3 %
Neutro Abs: 1.4 10*3/uL — ABNORMAL LOW (ref 1.7–7.7)
Neutrophils Relative %: 69 %
Platelet Count: 56 10*3/uL — ABNORMAL LOW (ref 150–400)
RBC: 2.7 MIL/uL — ABNORMAL LOW (ref 3.87–5.11)
RDW: 14.6 % (ref 11.5–15.5)
WBC Count: 2 10*3/uL — ABNORMAL LOW (ref 4.0–10.5)
nRBC: 0 % (ref 0.0–0.2)

## 2019-10-31 NOTE — Progress Notes (Signed)
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  Patient approved for the grant. Gave her a copy of the award letter and expense sheet as well as the Outpatient pharmacy information. Discussed in detail expenses and how they are submitted. She brought one with her today. She also received a gift card for gas/groceries.  She brought Duanne [place] application. Emailed to Jennifer_0  and advised she will reach out to patient directly if they can help with any additional financial resources. She verbalized understanding.  She has my card for any additional financial questions or concerns.

## 2019-10-31 NOTE — Progress Notes (Signed)
[place] OFFICE PROGRESS NOTE  Patient Care Team: Marrian Salvage, [place] as PCP - General (Internal Medicine) Truitt Merle, MD as Consulting Physician (Hematology) Judeth Horn, MD as Consulting Physician (General Surgery) Nat Math, MD as Referring Physician (Obstetrics and Gynecology) Elam Dutch, MD as Consulting Physician (Vascular Surgery) [name], MD as Attending Physician (Gynecologic Oncology) Heath Lark, MD as Consulting Physician (Hematology and Oncology)  ASSESSMENT & PLAN:  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 [facility]) She has very poor tolerance to chemotherapy despite multiple dose adjustment We will delay her treatment by 2 weeks and I plan to reduce the dose of carboplatin and Taxol further She will complete cycle 6 of treatment this month I plan to repeat imaging study next month If she have complete response to therapy, we will stop treatment However, if she have residual disease, I would consider maintenance treatment  Pancytopenia, acquired [facility]) She continues to have severe pancytopenia despite multiple dose adjustment I have order serum vitamin B12 iron studies etc. they all came back unremarkable I will further reduce the dose of next cycle of treatment She does not need transfusion support today  Physical debility She has chronic disability due to history of severe neuropathy I reinforced the importance of getting physical therapy to assess and measure her disability so that we can get an accurate picture in the right equipment that she needs The patient is concerned about restriction and pandemic I recommend the very minimum 1 visit to get an accurate assessment Ideally, I recommend the patient to get more than 1 therapy so that she can regain the function after chemotherapy She will think about it   Orders Placed This Encounter  Procedures  . CT Abdomen Pelvis W Contrast    Standing Status:   Future    Standing Expiration Date:    10/30/2020    Order Specific Question:   If indicated for the ordered procedure, I authorize the administration of contrast media per Radiology protocol    Answer:   Yes    Order Specific Question:   Preferred imaging location?    Answer:   [facility]    Order Specific Question:   Radiology Contrast Protocol - do NOT remove file path    Answer:   [email or web address]    Order Specific Question:   Is patient pregnant?    Answer:   No    INTERVAL HISTORY: Please see below for problem oriented charting. She returns for final treatment She continues to complain of disability and neuropathy affecting her left leg Home health physical therapy visits were canceled According to the patient, she did not get advance notice She is also concerned about exposure in the pandemic She needed multiple DME in order to improve quality of life at home Unfortunately, I am not well informed about the exact nature of her DME without accurate assessment She denies recent nausea or constipation from treatment  SUMMARY OF ONCOLOGIC HISTORY: Oncology History Overview Note  BRCA1 positive   Cancer of right colon ([place])  09/16/2014 Imaging   CT abd/pel:  a 3.8cm cecal mass with associated ileocecal intussusception, and a 9.1cm solid and cystic mass in the left pelvis,   09/18/2014 Initial Diagnosis   Adenocarcinoma of right colon   09/18/2014 Pathologic Stage   pT3pN1Mx, tumor extends into pericolonic soft tissue and is less than 65m from the serosal surface, LVI 8-), PNI (-), all 23 node negative, one soft tissue tumor deposit, surgical margins negative.  09/18/2014 Surgery   right hemicolectomy and terminal ileoectomy   08/06/2016 Imaging   CT chest/abd/pelvis with contrast IMPRESSION: 1. No recurrent malignancy identified. 2. 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 On the prior exam from  09/13/2015 there was back flow of contrast in the left external iliac artery to supply the left internal iliac artery ; this back flow of contrast is no longer present in the left external iliac artery is occluded. 3. Several pulmonary nodules at or below 4 mm diameter, unchanged. 4. 3 mm hypodensity posteriorly in the body of the pancreas, not well seen previously and subtle today, likely incidental, but merits observation. 5. 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 Prominent degenerative spurring of both acetabula. 6. Lumbar spondylosis and degenerative disc disease causing prominent impingement at L4-5 and mild impingement at L5-S1.   03/08/2017 Imaging   CT CAP W Contrast 03/08/17 IMPRESSION: Chest Impression: 1. Stable small pulmonary nodules within LEFT lung. 2. No mediastinal lymphadenopathy. Abdomen / Pelvis Impression: 1. No evidence of metastatic colorectal cancer or ovarian cancer within the abdomen pelvis. 2. No lymphadenopathy. 3. Postsurgical change consistent RIGHT hemicolectomy and hysterectomy 4. A fem-fem arterial bypass noted.   11/13/2017 Imaging   CT AP w contrast IMPRESSION: 1. No findings of recurrent malignancy in the abdomen or pelvis. 2. Other imaging findings of potential clinical significance: Chronic AVN of both femoral heads without contour abnormality. Lumbar impingement at L4-5 and L5-S1. Aortic Atherosclerosis (ICD10-I70.0). 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 Partial right hemicolectomy. Hysterectomy.   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 [facility])  06/16/2019 Tumor Marker   Patient's tumor was tested for the following markers: CA-125 Results of the tumor marker test revealed 499   10/09/2014 Imaging   CT of the abdomen and pelvis showed a 9.1 cm solid and cystic mass in the left pelvis concerning for malignancy. This was discovered during her  workup for colon cancer.   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 Tumor size 3.7 cm, no lymph nodes removed. T1cNx. Tumor cells are positive for cytokeratin 7 and estrogen receptor.   11/28/2014 Cancer Staging   Staging form: Ovary, AJCC 7th Edition - Clinical: Stage III (rT3, N0, M0) - Signed by Heath Lark, MD on 06/09/2019   03/08/2017 Imaging   CT CAP W Contrast 03/08/17 IMPRESSION: Chest Impression: 1. Stable small pulmonary nodules within LEFT lung. 2. No mediastinal lymphadenopathy. Abdomen / Pelvis Impression: 1. No evidence of metastatic colorectal cancer or ovarian cancer within the abdomen pelvis. 2. No lymphadenopathy. 3. Postsurgical change consistent RIGHT hemicolectomy and hysterectomy 4. A fem-fem arterial bypass noted.   03/12/2017 Mammogram   MM Right Breast 03/12/17 IMPRESSION: No mammographic or sonographic evidence of malignancy. No correlate identified for the abnormal enhancement seen within the right nipple on MRI of 02/27/2017.    04/02/2017 Pathology Results   Surgical Pathology of Nipple Biopsy: 04/02/17 Diagnosis Nipple Biopsy, Right - BENIGN NIPPLE TISSUE. - NO ATYPIA OR MALIGNANCY.    11/13/2017 Imaging   1. No findings of recurrent malignancy in the abdomen or pelvis. 2. Other imaging findings of potential clinical significance: Chronic AVN of both femoral heads without  contour abnormality. Lumbar impingement at L4-5 and L5-S1. Aortic Atherosclerosis (ICD10-I70.0). 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 Partial right hemicolectomy. Hysterectomy.   03/22/2018 Mammogram   Screening mammogram IMPRESSION No evidence of malignancy.     05/02/2019  Tumor Marker   Patient's tumor was tested for the following markers: CA-125 Results of the tumor marker test revealed 360   05/24/2019 Imaging   CT abdomen and pelvis New peritoneal carcinomatosis in the pelvis and right lower quadrant.   06/06/2019 Imaging   1. Tiny bilateral pulmonary nodules stable since 11/12/2018. Continued close attention on follow-up recommended. 2. Incompletely visualized upper abdominal lymphadenopathy better characterized on the abdomen CT from 2 weeks ago. 3.  Aortic Atherosclerois (ICD10-170.0) 4.  Emphysema. ([id number])   06/07/2019 Pathology Results   Soft Tissue Needle Core Biopsy, peritoneal nodule within right lower abdomen - METASTATIC CARCINOMA CONSISTENT WITH PATIENT'S CLINICAL HISTORY OF PRIMARY OVARIAN CARCINOMA. SEE NOTE Diagnosis Note Immunohistochemical stains show that the tumor cells are positive for CK7, ER and PAX 8; and negative for CK20 and CDX2. This immunostaining profile is consistent with above diagnosis.   06/07/2019 Procedure   Technically successful CT guided core needle biopsy of indeterminate peritoneal nodule within the right lower abdomen/pelvis.   06/20/2019 -  Chemotherapy   The patient had palonosetron (ALOXI) injection 0.25 mg, 0.25 mg, Intravenous,  Once, 5 of 6 cycles Administration: 0.25 mg (06/20/2019), 0.25 mg (07/11/2019), 0.25 mg (08/05/2019), 0.25 mg (09/05/2019), 0.25 mg (10/03/2019) CARBOplatin (PARAPLATIN) 670 mg in sodium chloride 0.9 % 250 mL chemo infusion, 600 mg (100 % of original dose 600 mg), Intravenous,  Once, 5 of 6 cycles Dose modification:   (original dose 600 mg, Cycle 1), 600 mg (original dose 600 mg, Cycle 1), 480 mg (80 % of original dose 600 mg, Cycle 2, Reason: Dose Not Tolerated),   (original dose 600 mg, Cycle 3, Reason: Dose not tolerated), 532 mg (original dose 600 mg, Cycle 3), 551.5 mg (original dose 600 mg, Cycle 4, Reason: Dose not tolerated), 551.5 mg (original dose 600 mg, Cycle 5), 480 mg  (original dose 600 mg, Cycle 5, Reason: Other (see comments), Comment: md wants 480 mg), 300 mg (original dose 600 mg, Cycle 6, Reason: Dose not tolerated), 389.55 mg (original dose 600 mg, Cycle 6, Reason: Dose not tolerated) Administration: 670 mg (06/20/2019), 480 mg (07/11/2019), 530 mg (08/05/2019), 530 mg (09/05/2019), 480 mg (10/03/2019) PACLitaxel (TAXOL) 252 mg in sodium chloride 0.9 % 250 mL chemo infusion (> 19m/m2), 131.25 mg/m2 = 252 mg (75 % of original dose 175 mg/m2), Intravenous,  Once, 5 of 6 cycles Dose modification: 131.25 mg/m2 (75 % of original dose 175 mg/m2, Cycle 1, Reason: Provider Judgment), 1518.3358mg/m2 (66.7 % of original dose 175 mg/m2, Cycle 4, Reason: Dose Not Tolerated), 87.5 mg/m2 (50 % of original dose 175 mg/m2, Cycle 6, Reason: Dose Not Tolerated) Administration: 252 mg (06/20/2019), 252 mg (07/11/2019), 246 mg (08/05/2019), 216 mg (09/05/2019), 216 mg (10/03/2019) fosaprepitant (EMEND) 150 mg, dexamethasone (DECADRON) 12 mg in sodium chloride 0.9 % 145 mL IVPB, , Intravenous,  Once, 5 of 6 cycles Administration:  (06/20/2019),  (07/11/2019),  (08/05/2019),  (09/05/2019),  (10/03/2019)  for chemotherapy treatment.    07/11/2019 Tumor Marker   Patient's tumor was tested for the following markers: CA-125 Results of the tumor marker test revealed 438   08/05/2019 Tumor Marker   Patient's tumor was tested for the following markers:  CA-125 Results of the tumor marker test revealed 272.   08/25/2019 Imaging   Ct chest, abdomen and pelvis 1. Slight interval decrease in size of a right lower quadrant peritoneal nodule measuring 1.7 x 1.6 cm, previously 2.2 x 2.2 cm (series 2, image 94, series 5, image 58). Findings are consistent with treatment response.   2. No significant change in ill-defined soft tissue about the sigmoid colon in the hysterectomy bed (series 2, image 101).   3.  Status post hysterectomy.   4. No evidence of metastatic disease in the chest. Stable benign  small pulmonary nodules.   5.  Coronary artery disease.  Aortic Atherosclerosis (ICD10-I70.0).     08/26/2019 Tumor Marker   Patient's tumor was tested for the following markers: CA-125 Results of the tumor marker test revealed 189.   09/26/2019 Tumor Marker   Patient's tumor was tested for the following markers: CA-125 Results of the tumor marker test revealed 137   10/03/2019 Tumor Marker   Patient's tumor was tested for the following markers: CA-125 Results of the tumor marker test revealed 137.     REVIEW OF SYSTEMS:   Constitutional: Denies fevers, chills or abnormal weight loss Eyes: Denies blurriness of vision Ears, nose, mouth, throat, and face: Denies mucositis or sore throat Respiratory: Denies cough, dyspnea or wheezes Cardiovascular: Denies palpitation, chest discomfort or lower extremity swelling Gastrointestinal:  Denies nausea, heartburn or change in bowel habits Skin: Denies abnormal skin rashes Lymphatics: Denies new lymphadenopathy or easy bruising Behavioral/Psych: Mood is stable, no new changes  All other systems were reviewed with the patient and are negative.  I have reviewed the past medical history, past surgical history, social history and family history with the patient and they are unchanged from previous note.  ALLERGIES:  is allergic to other; demerol [meperidine]; morphine and related; oxycodone; penicillins; strawberry flavor; tylenol [acetaminophen]; and ibuprofen.  MEDICATIONS:  Current Outpatient Medications  Medication Sig Dispense Refill  . acetaminophen (TYLENOL) 325 MG tablet Take 325 mg by mouth as needed.    [name] dexamethasone (DECADRON) 4 MG tablet Take 3 tabs at the night before and 3 tab the morning of chemotherapy, every 3 weeks, by mouth for 6 cycles 36 tablet 9  . docusate sodium 100 MG CAPS Take 100 mg by mouth 2 (two) times daily as needed for mild constipation. 10 capsule 0  . HYDROcodone-acetaminophen (NORCO/VICODIN) 5-325 MG tablet  Take 1 tablet by mouth every 6 (six) hours as needed for moderate pain. 60 tablet 0  . lidocaine (LIDODERM) 5 % PLACE 1 PATCH ONTO THE SKIN EVERY DAY.REMOVE AND DISCARD PATCH WITHIN 12 HOURS OR AS DIRECTED BY PRESCRIBER 30 patch 1  . LORazepam (ATIVAN) 0.5 MG tablet Take 1 tablet (0.5 mg total) by mouth 3 (three) times daily as needed for anxiety. 10 tablet 0  . losartan (COZAAR) 50 MG tablet Take 1 tablet (50 mg total) by mouth daily. 90 tablet 2  . ondansetron (ZOFRAN) 8 MG tablet Take 1 tablet (8 mg total) by mouth every 8 (eight) hours as needed for nausea or vomiting. 30 tablet 1  . pregabalin (LYRICA) 200 MG capsule Take 1 capsule (200 mg total) by mouth every 8 (eight) hours. Please give patient brand name Lyrica 90 capsule 5  . prochlorperazine (COMPAZINE) 10 MG tablet Take 1 tablet (10 mg total) by mouth every 6 (six) hours as needed (Nausea or vomiting). 30 tablet 1   No current facility-administered medications for this visit.  PHYSICAL EXAMINATION: ECOG PERFORMANCE STATUS: 1 - Symptomatic but completely ambulatory  Vitals:   10/31/19 1048  BP: (!) 152/84  Pulse: 98  Resp: 18  Temp: 98.2 F (36.8 C)  SpO2: 98%   Filed Weights   10/31/19 1048  Weight: 165 lb (74.8 kg)    GENERAL:alert, no distress and comfortable SKIN: skin color, texture, turgor are normal, no rashes or significant lesions EYES: normal, Conjunctiva are pink and non-injected, sclera clear OROPHARYNX:no exudate, no erythema and lips, buccal mucosa, and tongue normal  NECK: supple, thyroid normal size, non-tender, without nodularity LYMPH:  no palpable lymphadenopathy in the cervical, axillary or inguinal LUNGS: clear to auscultation and percussion with normal breathing effort HEART: regular rate & rhythm and no murmurs and no lower extremity edema ABDOMEN:abdomen soft, non-tender and normal bowel sounds Musculoskeletal:no cyanosis of digits and no clubbing  NEURO: alert & oriented x 3 with fluent  speech, no focal motor/sensory deficits  LABORATORY DATA:  I have reviewed the data as listed    Component Value Date/Time   NA 142 10/31/2019 1009   NA 141 10/07/2017 1436   K 4.2 10/31/2019 1009   K 4.4 10/07/2017 1436   CL 105 10/31/2019 1009   CO2 25 10/31/2019 1009   CO2 26 10/07/2017 1436   GLUCOSE 131 (H) 10/31/2019 1009   GLUCOSE 85 10/07/2017 1436   BUN 14 10/31/2019 1009   BUN 14.9 10/07/2017 1436   CREATININE 0.83 10/31/2019 1009   CREATININE 0.94 10/03/2019 0956   CREATININE 1.0 10/07/2017 1436   CALCIUM 10.0 10/31/2019 1009   CALCIUM 9.5 10/07/2017 1436   PROT 8.2 (H) 10/31/2019 1009   PROT 7.8 10/07/2017 1436   ALBUMIN 4.3 10/31/2019 1009   ALBUMIN 3.9 10/07/2017 1436   AST 18 10/31/2019 1009   AST 20 10/03/2019 0956   AST 15 10/07/2017 1436   ALT 10 10/31/2019 1009   ALT 24 10/03/2019 0956   ALT 7 10/07/2017 1436   ALKPHOS 73 10/31/2019 1009   ALKPHOS 81 10/07/2017 1436   BILITOT 0.4 10/31/2019 1009   BILITOT 0.3 10/03/2019 0956   BILITOT 0.54 10/07/2017 1436   GFRNONAA >60 10/31/2019 1009   GFRNONAA >60 10/03/2019 0956   GFRAA >60 10/31/2019 1009   GFRAA >60 10/03/2019 0956    No results found for: SPEP, UPEP  Lab Results  Component Value Date   WBC 2.0 (L) 10/31/2019   NEUTROABS 1.4 (L) 10/31/2019   HGB 10.8 (L) 10/31/2019   HCT 30.0 (L) 10/31/2019   MCV 111.1 (H) 10/31/2019   PLT 56 (L) 10/31/2019      Chemistry      Component Value Date/Time   NA 142 10/31/2019 1009   NA 141 10/07/2017 1436   K 4.2 10/31/2019 1009   K 4.4 10/07/2017 1436   CL 105 10/31/2019 1009   CO2 25 10/31/2019 1009   CO2 26 10/07/2017 1436   BUN 14 10/31/2019 1009   BUN 14.9 10/07/2017 1436   CREATININE 0.83 10/31/2019 1009   CREATININE 0.94 10/03/2019 0956   CREATININE 1.0 10/07/2017 1436      Component Value Date/Time   CALCIUM 10.0 10/31/2019 1009   CALCIUM 9.5 10/07/2017 1436   ALKPHOS 73 10/31/2019 1009   ALKPHOS 81 10/07/2017 1436   AST 18  10/31/2019 1009   AST 20 10/03/2019 0956   AST 15 10/07/2017 1436   ALT 10 10/31/2019 1009   ALT 24 10/03/2019 0956   ALT 7 10/07/2017 1436  BILITOT 0.4 10/31/2019 1009   BILITOT 0.3 10/03/2019 0956   BILITOT 0.54 10/07/2017 1436       All questions were answered. The patient knows to call the clinic with any problems, questions or concerns. No barriers to learning was detected.  I spent 25 minutes counseling the patient face to face. The total time spent in the appointment was 40 minutes and more than 50% was on counseling and review of test results  Heath Lark, MD 10/31/2019 11:30 AM

## 2019-10-31 NOTE — Assessment & Plan Note (Signed)
She has chronic disability due to history of severe neuropathy I reinforced the importance of getting physical therapy to assess and measure her disability so that we can get an accurate picture in the right equipment that she needs The patient is concerned about restriction and pandemic I recommend the very minimum 1 visit to get an accurate assessment Ideally, I recommend the patient to get more than 1 therapy so that she can regain the function after chemotherapy She will think about it

## 2019-10-31 NOTE — Assessment & Plan Note (Signed)
She continues to have severe pancytopenia despite multiple dose adjustment I have order serum vitamin B12 iron studies etc. they all came back unremarkable I will further reduce the dose of next cycle of treatment She does not need transfusion support today

## 2019-10-31 NOTE — Assessment & Plan Note (Signed)
She has very poor tolerance to chemotherapy despite multiple dose adjustment We will delay her treatment by 2 weeks and I plan to reduce the dose of carboplatin and Taxol further She will complete cycle 6 of treatment this month I plan to repeat imaging study next month If she have complete response to therapy, we will stop treatment However, if she have residual disease, I would consider maintenance treatment

## 2019-11-01 ENCOUNTER — Other Ambulatory Visit: Payer: Self-pay | Admitting: Hematology and Oncology

## 2019-11-01 ENCOUNTER — Telehealth: Payer: Self-pay | Admitting: Hematology and Oncology

## 2019-11-01 ENCOUNTER — Telehealth: Payer: Self-pay | Admitting: *Deleted

## 2019-11-01 DIAGNOSIS — R5381 Other malaise: Secondary | ICD-10-CM

## 2019-11-01 LAB — CA 125: Cancer Antigen (CA) 125: 101 U/mL — ABNORMAL HIGH (ref 0.0–38.1)

## 2019-11-01 NOTE — Telephone Encounter (Signed)
Received phone call from [place]. Patient has refused multiple attempts to have nurses in her home. Patient states she is fearful of COVID and does not want people in her home at this time.

## 2019-11-01 NOTE — Telephone Encounter (Signed)
That was the conversation I had with her yesterday Without PT assessment I do not know how to order all the equipment she needed She agreed to allow them at least 1 time to come in and measure and let me know exactly what she wants

## 2019-11-01 NOTE — Telephone Encounter (Signed)
Spoke to patient- She agrees to visit in the home. She understands that she needs this in order to get equipment ordered for her home use.   Catherine Rogers, [facility] called back- message left for return call. They will need a new order since the case was closed patient refusal.

## 2019-11-01 NOTE — Telephone Encounter (Signed)
I put in new order

## 2019-11-01 NOTE — Telephone Encounter (Signed)
Scheduled appt per 12/7 sch message - unable to reach pt . Left message with appt date and time

## 2019-11-02 ENCOUNTER — Other Ambulatory Visit: Payer: Medicare Other

## 2019-11-02 ENCOUNTER — Ambulatory Visit: Payer: Medicare Other | Admitting: Hematology

## 2019-11-08 ENCOUNTER — Telehealth: Payer: Self-pay

## 2019-11-08 ENCOUNTER — Other Ambulatory Visit: Payer: Self-pay | Admitting: Hematology and Oncology

## 2019-11-08 MED ORDER — HYDROCODONE-ACETAMINOPHEN 5-325 MG PO TABS: 1.0000 | ORAL_TABLET | Freq: Four times a day (QID) | ORAL | 0 refills | Status: DC | PRN

## 2019-11-08 NOTE — Telephone Encounter (Signed)
She called and requested a refill on Hydrocodone. Ask that Rx be sent to [facility].

## 2019-11-08 NOTE — Telephone Encounter (Signed)
1) her refill is done 2) make sure she has no drinking 3-5 days before chemo (alcohol may affect her blood count) 3) Ct scan is scheduled early January. My original plan is to wait until the day before I see her mid-January. If she wants to keep it that way, Please schedule 30 mins appt on 1/7

## 2019-11-08 NOTE — Telephone Encounter (Signed)
Called and given below message. She verbalized understanding. She denies drinking alcohol. She would like appt on 1/7. Appt scheduled for 1115. She verbalized understanding to appt.

## 2019-11-14 ENCOUNTER — Inpatient Hospital Stay: Payer: Medicare Other

## 2019-11-14 ENCOUNTER — Other Ambulatory Visit (HOSPITAL_BASED_OUTPATIENT_CLINIC_OR_DEPARTMENT_OTHER): Payer: Self-pay | Admitting: Medical

## 2019-11-14 ENCOUNTER — Inpatient Hospital Stay (HOSPITAL_BASED_OUTPATIENT_CLINIC_OR_DEPARTMENT_OTHER): Payer: Medicare Other | Admitting: Medical

## 2019-11-14 ENCOUNTER — Other Ambulatory Visit: Payer: Self-pay

## 2019-11-14 VITALS — BP 159/89 | HR 75 | Temp 98.6°F | Resp 16 | Wt 163.5 lb

## 2019-11-14 DIAGNOSIS — C562 Malignant neoplasm of left ovary: Secondary | ICD-10-CM

## 2019-11-14 DIAGNOSIS — R109 Unspecified abdominal pain: Secondary | ICD-10-CM

## 2019-11-14 DIAGNOSIS — Z5111 Encounter for antineoplastic chemotherapy: Secondary | ICD-10-CM | POA: Diagnosis not present

## 2019-11-14 DIAGNOSIS — Z79899 Other long term (current) drug therapy: Secondary | ICD-10-CM

## 2019-11-14 DIAGNOSIS — K219 Gastro-esophageal reflux disease without esophagitis: Secondary | ICD-10-CM

## 2019-11-14 LAB — CMP (CANCER CENTER ONLY)
ALT: 8 U/L (ref 0–44)
AST: 13 U/L — ABNORMAL LOW (ref 15–41)
Albumin: 4 g/dL (ref 3.5–5.0)
Alkaline Phosphatase: 61 U/L (ref 38–126)
Anion gap: 13 (ref 5–15)
BUN: 14 mg/dL (ref 6–20)
CO2: 22 mmol/L (ref 22–32)
Calcium: 9.3 mg/dL (ref 8.9–10.3)
Chloride: 109 mmol/L (ref 98–111)
Creatinine: 0.85 mg/dL (ref 0.44–1.00)
GFR, Est AFR Am: >60 mL/min (ref >60)
GFR, Estimated: >60 mL/min (ref >60)
Glucose, Bld: 238 mg/dL — ABNORMAL HIGH (ref 70–99)
Potassium: 4.1 mmol/L (ref 3.5–5.1)
Sodium: 144 mmol/L (ref 135–145)
Total Bilirubin: 0.3 mg/dL (ref 0.3–1.2)
Total Protein: 7.3 g/dL (ref 6.5–8.1)

## 2019-11-14 LAB — CBC WITH DIFFERENTIAL (CANCER CENTER ONLY)
Abs Immature Granulocytes: 0.01 10*3/uL (ref 0.00–0.07)
Basophils Absolute: 0 10*3/uL (ref 0.0–0.1)
Basophils Relative: 0 %
Eosinophils Absolute: 0 10*3/uL (ref 0.0–0.5)
Eosinophils Relative: 0 %
HCT: 31.9 % — ABNORMAL LOW (ref 36.0–46.0)
Hemoglobin: 11 g/dL — ABNORMAL LOW (ref 12.0–15.0)
Immature Granulocytes: 0 %
Lymphocytes Relative: 19 %
Lymphs Abs: 0.6 10*3/uL — ABNORMAL LOW (ref 0.7–4.0)
MCH: 39.7 pg — ABNORMAL HIGH (ref 26.0–34.0)
MCHC: 34.5 g/dL (ref 30.0–36.0)
MCV: 115.2 fL — ABNORMAL HIGH (ref 80.0–100.0)
Monocytes Absolute: 0 10*3/uL — ABNORMAL LOW (ref 0.1–1.0)
Monocytes Relative: 1 %
Neutro Abs: 2.7 10*3/uL (ref 1.7–7.7)
Neutrophils Relative %: 80 %
Platelet Count: 151 10*3/uL (ref 150–400)
RBC: 2.77 MIL/uL — ABNORMAL LOW (ref 3.87–5.11)
RDW: 14.4 % (ref 11.5–15.5)
WBC Count: 3.3 10*3/uL — ABNORMAL LOW (ref 4.0–10.5)
nRBC: 0 % (ref 0.0–0.2)

## 2019-11-14 MED ORDER — SODIUM CHLORIDE 0.9 % IV SOLN
87.5000 mg/m2 | Freq: Once | INTRAVENOUS | Status: AC
  Administered 2019-11-14: 162 mg via INTRAVENOUS
  Filled 2019-11-14: qty 27

## 2019-11-14 MED ORDER — PALONOSETRON HCL INJECTION 0.25 MG/5ML
0.2500 mg | Freq: Once | INTRAVENOUS | Status: AC
  Administered 2019-11-14: 0.25 mg via INTRAVENOUS

## 2019-11-14 MED ORDER — ALUM & MAG HYDROXIDE-SIMETH 200-200-20 MG/5ML PO SUSP
30.0000 mL | Freq: Once | ORAL | Status: AC
  Administered 2019-11-14: 30 mL via ORAL

## 2019-11-14 MED ORDER — LIDOCAINE VISCOUS HCL 2 % MT SOLN
OROMUCOSAL | Status: AC
  Filled 2019-11-14: qty 15

## 2019-11-14 MED ORDER — FAMOTIDINE IN NACL 20-0.9 MG/50ML-% IV SOLN
20.0000 mg | Freq: Once | INTRAVENOUS | Status: AC
  Administered 2019-11-14: 20 mg via INTRAVENOUS

## 2019-11-14 MED ORDER — LIDOCAINE VISCOUS HCL 2 % MT SOLN
15.0000 mL | Freq: Once | OROMUCOSAL | Status: AC
  Administered 2019-11-14: 15 mL via ORAL

## 2019-11-14 MED ORDER — SODIUM CHLORIDE 0.9 % IV SOLN
Freq: Once | INTRAVENOUS | Status: AC
  Filled 2019-11-14: qty 250

## 2019-11-14 MED ORDER — ATROPINE SULFATE 1 MG/ML IJ SOLN
INTRAMUSCULAR | Status: AC
  Filled 2019-11-14: qty 1

## 2019-11-14 MED ORDER — SODIUM CHLORIDE 0.9 % IV SOLN
389.5500 mg | Freq: Once | INTRAVENOUS | Status: AC
  Administered 2019-11-14: 390 mg via INTRAVENOUS
  Filled 2019-11-14: qty 39

## 2019-11-14 MED ORDER — DIPHENHYDRAMINE HCL 50 MG/ML IJ SOLN
50.0000 mg | Freq: Once | INTRAMUSCULAR | Status: AC
  Administered 2019-11-14: 50 mg via INTRAVENOUS

## 2019-11-14 MED ORDER — SODIUM CHLORIDE 0.9 % IV SOLN
Freq: Once | INTRAVENOUS | Status: AC
  Filled 2019-11-14: qty 5

## 2019-11-14 MED ORDER — ATROPINE SULFATE 1 MG/ML IJ SOLN
0.5000 mg | Freq: Once | INTRAMUSCULAR | Status: AC
  Administered 2019-11-14: 0.5 mg via INTRAVENOUS

## 2019-11-14 MED ORDER — DIPHENHYDRAMINE HCL 50 MG/ML IJ SOLN
INTRAMUSCULAR | Status: AC
  Filled 2019-11-14: qty 1

## 2019-11-14 MED ORDER — PALONOSETRON HCL INJECTION 0.25 MG/5ML
INTRAVENOUS | Status: AC
  Filled 2019-11-14: qty 5

## 2019-11-14 MED ORDER — ALUM & MAG HYDROXIDE-SIMETH 200-200-20 MG/5ML PO SUSP
ORAL | Status: AC
  Filled 2019-11-14: qty 30

## 2019-11-14 MED ORDER — FAMOTIDINE IN NACL 20-0.9 MG/50ML-% IV SOLN
INTRAVENOUS | Status: AC
  Filled 2019-11-14: qty 50

## 2019-11-14 MED ORDER — SODIUM CHLORIDE 0.9 % IV SOLN
Freq: Once | INTRAVENOUS | Status: DC | PRN
  Filled 2019-11-14: qty 250

## 2019-11-14 NOTE — Progress Notes (Signed)
At 13:53, Pt. with possible reaction to Taxol.  Complained of abdominal pain, sharp type pain, rates pain 9/10 and bilateral lower leg pain a "nagging, irritable" type pain, rates pain a 7/10.  Taxol stopped, NS started, Sandi Mealy PA notified and in for observation/assessment. Vital signs taken, pt. given Atropine 0.5mg  IV. States pain better after Atropine, abdominal pain a 8/10 and bilateral lower leg pain 5/10.Per Sandi Mealy PA, ok to restart Taxol and medicated with Lidocaine and Maalox/Mylanta po. Pt. states pain is better, abdominal pain a 4/10 and bilateral lower leg pain 2/10. Taxol infusing and pt. states she feels "much better".

## 2019-11-14 NOTE — Progress Notes (Addendum)
Catherine Rogers was seen in the infusion room as she was receiving Taxol.  She reported sharp, 9/10 abdominal pain, sharp type pain and a 7/10 nagging bilateral leg pain. Taxol was stopped with normal saline started. She was given atropine 0.5mg  IV. Abdominal pain continued but was mildly better as was her leg pain after dosing with Atropine. Taxol was restarted with the patient continuing to have abdominal pain. She was given Lidocaine and Maalox/Mylanta po. She continued to feel better and was able to complete her Taxol without additional issues of concern.   Sandi Mealy, MHS, PA-C Physician Assistant

## 2019-11-14 NOTE — Patient Instructions (Addendum)
Center Discharge Instructions for Patients Receiving Chemotherapy  Today you received the following chemotherapy agents:Taxol and Carboplatin  To help prevent nausea and vomiting after your treatment, we encourage you to take your nausea medication as directed by your MD.   If you develop nausea and vomiting that is not controlled by your nausea medication, call the clinic.   BELOW ARE SYMPTOMS THAT SHOULD BE REPORTED IMMEDIATELY:  *FEVER GREATER THAN 100.5 F  *CHILLS WITH OR WITHOUT FEVER  NAUSEA AND VOMITING THAT IS NOT CONTROLLED WITH YOUR NAUSEA MEDICATION  *UNUSUAL SHORTNESS OF BREATH  *UNUSUAL BRUISING OR BLEEDING  TENDERNESS IN MOUTH AND THROAT WITH OR WITHOUT PRESENCE OF ULCERS  *URINARY PROBLEMS  *BOWEL PROBLEMS  UNUSUAL RASH Items with * indicate a potential emergency and should be followed up as soon as possible.  Feel free to call the clinic should you have any questions or concerns. The clinic phone number is [phone].  Please show the [place] at check-in to the Emergency Department and triage nurse.  Coronavirus (COVID-19) Are you at risk?  Are you at risk for the Coronavirus (COVID-19)?  To be considered HIGH RISK for Coronavirus (COVID-19), you have to meet the following criteria:  . Traveled to [place], [place], [place], [place] or [place]; or in the [place] to [place], [place], [place], or [place]; and have fever, cough, and shortness of breath within the last 2 weeks of travel OR . Been in close contact with a person diagnosed with COVID-19 within the last 2 weeks and have fever, cough, and shortness of breath . IF YOU DO NOT MEET THESE CRITERIA, YOU ARE CONSIDERED LOW RISK FOR COVID-19.  What to do if you are HIGH RISK for COVID-19?  [name] If you are having a medical emergency, call 911. . Seek medical care right away. Before you go to a doctor's office, urgent care or emergency department, call ahead and  tell them about your recent travel, contact with someone diagnosed with COVID-19, and your symptoms. You should receive instructions from your physician's office regarding next steps of care.  . When you arrive at healthcare provider, tell the healthcare staff immediately you have returned from visiting [place], [place], [place], [place] or [place]; or traveled in the [place] to [place], [place], [place], or [place]; in the last two weeks or you have been in close contact with a person diagnosed with COVID-19 in the last 2 weeks.   . Tell the health care staff about your symptoms: fever, cough and shortness of breath. . After you have been seen by a medical provider, you will be either: o Tested for (COVID-19) and discharged home on quarantine except to seek medical care if symptoms worsen, and asked to  - Stay home and avoid contact with others until you get your results (4-5 days)  - Avoid travel on public transportation if possible (such as bus, train, or airplane) or o Sent to the Emergency Department by EMS for evaluation, COVID-19 testing, and possible admission depending on your condition and test results.  What to do if you are LOW RISK for COVID-19?  Reduce your risk of any infection by using the same precautions used for avoiding the common cold or flu:  [name] Wash your hands often with soap and warm water for at least 20 seconds.  If soap and water are not readily available, use an alcohol-based hand sanitizer with at least 60% alcohol.  [name]  If coughing or sneezing, cover your mouth and nose by coughing or sneezing into the elbow areas of your shirt or coat, into a tissue or into your sleeve (not your hands). . Avoid shaking hands with others and consider head nods or verbal greetings only. . Avoid touching your eyes, nose, or mouth with unwashed hands.  . Avoid close contact with people who are sick. . Avoid places or events with large numbers of people in one location, like  concerts or sporting events. . Carefully consider travel plans you have or are making. . If you are planning any travel outside or inside the [place], visit the CDC's Travelers' Health webpage for the latest health notices. . If you have some symptoms but not all symptoms, continue to monitor at home and seek medical attention if your symptoms worsen. . If you are having a medical emergency, call 911.   [place] / e-Visit: [email or web address]         [facility]: [facility]: [phone]                   [facility]: [phone]

## 2019-11-16 NOTE — Addendum Note (Signed)
Addended by: Harle Stanford on: 11/16/2019 01:44 PM   Modules accepted: Level of Service

## 2019-11-29 ENCOUNTER — Other Ambulatory Visit: Payer: Self-pay

## 2019-11-29 ENCOUNTER — Ambulatory Visit (HOSPITAL_COMMUNITY)
Admission: RE | Admit: 2019-11-29 | Discharge: 2019-11-29 | Disposition: A | Discharge status: Home / Self Care | Payer: Medicare Other | Source: Ambulatory Visit | Attending: Hematology and Oncology | Admitting: Hematology and Oncology

## 2019-11-29 DIAGNOSIS — C562 Malignant neoplasm of left ovary: Secondary | ICD-10-CM

## 2019-11-29 MED ORDER — IOHEXOL 300 MG/ML  SOLN
100.0000 mL | Freq: Once | INTRAMUSCULAR | Status: AC | PRN
  Administered 2019-11-29: 100 mL via INTRAVENOUS

## 2019-12-01 ENCOUNTER — Telehealth: Payer: Self-pay | Admitting: Pharmacist

## 2019-12-01 ENCOUNTER — Encounter: Payer: Self-pay | Admitting: Hematology and Oncology

## 2019-12-01 ENCOUNTER — Other Ambulatory Visit: Payer: Self-pay

## 2019-12-01 ENCOUNTER — Inpatient Hospital Stay: Payer: Medicare Other | Attending: Hematology | Admitting: Hematology and Oncology

## 2019-12-01 ENCOUNTER — Other Ambulatory Visit: Payer: Self-pay | Admitting: *Deleted

## 2019-12-01 ENCOUNTER — Telehealth: Payer: Self-pay

## 2019-12-01 DIAGNOSIS — C786 Secondary malignant neoplasm of retroperitoneum and peritoneum: Secondary | ICD-10-CM | POA: Diagnosis not present

## 2019-12-01 DIAGNOSIS — G609 Hereditary and idiopathic neuropathy, unspecified: Secondary | ICD-10-CM | POA: Diagnosis not present

## 2019-12-01 DIAGNOSIS — Z85038 Personal history of other malignant neoplasm of large intestine: Secondary | ICD-10-CM | POA: Diagnosis not present

## 2019-12-01 DIAGNOSIS — C562 Malignant neoplasm of left ovary: Secondary | ICD-10-CM | POA: Insufficient documentation

## 2019-12-01 DIAGNOSIS — R110 Nausea: Secondary | ICD-10-CM | POA: Diagnosis not present

## 2019-12-01 DIAGNOSIS — G893 Neoplasm related pain (acute) (chronic): Secondary | ICD-10-CM | POA: Diagnosis not present

## 2019-12-01 DIAGNOSIS — Z7189 Other specified counseling: Secondary | ICD-10-CM | POA: Diagnosis not present

## 2019-12-01 DIAGNOSIS — Z79899 Other long term (current) drug therapy: Secondary | ICD-10-CM | POA: Diagnosis not present

## 2019-12-01 DIAGNOSIS — D61818 Other pancytopenia: Secondary | ICD-10-CM | POA: Diagnosis not present

## 2019-12-01 DIAGNOSIS — R5381 Other malaise: Secondary | ICD-10-CM

## 2019-12-01 DIAGNOSIS — C182 Malignant neoplasm of ascending colon: Secondary | ICD-10-CM | POA: Diagnosis present

## 2019-12-01 MED ORDER — HYDROCODONE-ACETAMINOPHEN 5-325 MG PO TABS: 1.0000 | ORAL_TABLET | Freq: Four times a day (QID) | ORAL | 0 refills | Status: DC | PRN

## 2019-12-01 MED ORDER — OLAPARIB 100 MG PO TABS: 200.0000 mg | ORAL_TABLET | Freq: Two times a day (BID) | ORAL | 11 refills | Status: DC

## 2019-12-01 MED ORDER — ONDANSETRON HCL 8 MG PO TABS: 8.0000 mg | ORAL_TABLET | Freq: Three times a day (TID) | ORAL | 1 refills | Status: DC | PRN

## 2019-12-01 NOTE — Assessment & Plan Note (Signed)
She has history of pancytopenia Plan upfront dose reduce olaparib

## 2019-12-01 NOTE — Telephone Encounter (Signed)
Oral Oncology Patient Advocate Encounter  Prior Authorization for Catherine Rogers has been approved.    PA# [id number] Effective dates: 12/01/19 through 11/23/20  Patients co-pay is $4  Oral Oncology Clinic will continue to follow.   [place] Patient [place] Phone [phone] Fax [phone] 12/01/2019 2:31 PM

## 2019-12-01 NOTE — Assessment & Plan Note (Signed)
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 17, 2017, the U.[place]. Food and Drug Administration granted regular approval to olaparib tablets ([place]) for the maintenance treatment of adult patients with recurrent epithelial ovarian, fallopian tube, or primary peritoneal cancer, who are in a complete or partial response to platinum-based chemotherapy.  The approval in the maintenance setting was based on two randomized, placebo-controlled, double-blind, multicenter trials in patients with recurrent ovarian cancers who were in response to platinum-based therapy.  SOLO-2 ([id number]) randomized 295 patients with recurrent germline BRCA-mutated ovarian, fallopian tube, or primary peritoneal cancer (2:1) to receive olaparib tablets 300 mg orally twice daily or placebo. SOLO-2 demonstrated a statistically significant improvement in investigator-assessed progression-free survival (PFS) in patients randomized to olaparib compared with those who received placebo, with a hazard ratio (HR) of 0.30 (95% CI: 0.22, 0.41; p<0.0001). The estimated median PFS was 19.1 and 5.5 months in the olaparib and placebo arms, respectively.   Study 19 ([id number]) randomized 265 patients regardless of BRCA status (1:1) to receive olaparib capsules 400 mg orally twice daily or placebo. Study 19 demonstrated a statistically significant improvement in investigator-assessed PFS in patients treated with olaparib vs. placebo with a HR of 0.35 (95% CI: 0.25, 0.49; p<0.0001). The estimated median PFS was 8.4 months and 4.8 months in the olaparib and placebo arms, respectively.   The most common adverse reactions (?20%) in clinical trials were anemia, nausea,  fatigue (including asthenia), vomiting, nasopharyngitis, diarrhea, arthralgia/myalgia, dysgeusia, headache, dyspepsia, decreased appetite, constipation, and stomatitis. The most common laboratory abnormalities (?25%) were decrease in hemoglobin, increase in mean corpuscular volume, decrease in lymphocytes, decrease in leukocytes, decrease in absolute neutrophil count, increase in serum creatinine, and decrease in platelets.  The patient is aware that the response rates discussed earlier is not guaranteed.  After a long discussion, patient made an informed decision to proceed with the prescribed plan of care.   Due to her history of chronic pancytopenia, I plan to reduce dose treatment upfront She understood once we start her on treatment, within the first month, she might have to come here on a weekly basis for toxicity review Once she has stable blood count while on treatment, we will space out her future appointment I plan to repeat imaging study again within 3 months for objective assessment of response to therapy

## 2019-12-01 NOTE — Telephone Encounter (Signed)
Oral Chemotherapy Pharmacist Encounter  [facility] will deliver the [place] to Catherine Rogers on 12/06/19. She knows to start the [place] when she receives it.  Patient Education I spoke with patient for overview of new oral chemotherapy medication: Lynparza (olaparib) for the maintenance treatment of ovarian cancer, planned duration until disease progression or unacceptable drug toxicity.   Counseled patient on administration, dosing, side effects, monitoring, drug-food interactions, safe handling, storage, and disposal. Patient will take 2 tablets (200 mg total) by mouth 2 (two) times daily. Swallow whole. May take with food to decrease nausea and vomiting.  Side effects include but not limited to: N/V, fatigue, diarrhea, decreased plt.    Reviewed with patient importance of keeping a medication schedule and plan for any missed doses.  Catherine Rogers voiced understanding and appreciation. All questions answered.  Provided patient with Oral [place] phone number. Patient knows to call the office with questions or concerns. Oral Chemotherapy Navigation Clinic will continue to follow.  Darl Pikes, PharmD, BCPS, [facility] Hematology/Oncology Clinical Pharmacist [facility] Oral [facility] [phone]  12/01/2019 3:23 PM

## 2019-12-01 NOTE — Telephone Encounter (Signed)
Oral Oncology Pharmacist Encounter  Received new prescription for Lynparza (olaparib) for the maintenance treatment of ovarian cancer, planned duration until disease progression or unacceptable drug toxicity.  CBC/CMP from 11/14/2019 assessed, no relevant lab abnormalities. Prescription dose and frequency assessed. Dose reduced due to hx of chronic pancytopenia.  Current medication list in Epic reviewed, no DDIs with olaparib identified.  Prescription has been e-scribed to the [facility] for benefits analysis and approval.  Oral Oncology Clinic will continue to follow for insurance authorization, copayment issues, initial counseling and start date.  Darl Pikes, PharmD, BCPS, [facility] Hematology/Oncology Clinical Pharmacist [facility] Oral [place] [facility] [phone]  12/01/2019 2:48 PM

## 2019-12-01 NOTE — Telephone Encounter (Signed)
Oral Oncology Patient Advocate Encounter  Received notification from [employer] that prior authorization for [place]ie [place] is required.  PA submitted on CoverMyMeds Key [id number] Status is pending  Oral Oncology Clinic will continue to follow.  [place] Patient [place] Phone [phone] Fax [phone] 12/01/2019 2:13 PM

## 2019-12-01 NOTE — Progress Notes (Signed)
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 17, 2017, the U.[place]. Food and Drug Administration granted regular approval to olaparib tablets ([place]) for the maintenance treatment of adult patients with recurrent epithelial ovarian, fallopian tube, or primary peritoneal cancer, who are in a complete or partial response to platinum-based chemotherapy.  The approval in the maintenance setting was based on two randomized, placebo-controlled, double-blind, multicenter trials in patients with recurrent ovarian cancers who were in response to platinum-based therapy.  SOLO-2 ([id number]) randomized 295 patients with recurrent germline BRCA-mutated ovarian, fallopian tube, or primary peritoneal cancer (2:1) to receive olaparib tablets 300 mg orally twice daily or placebo. SOLO-2 demonstrated a statistically significant improvement in investigator-assessed progression-free survival (PFS) in patients randomized to olaparib compared with those who received placebo, with a hazard ratio (HR) of 0.30 (95% CI: 0.22, 0.41; p<0.0001). The estimated median PFS was 19.1 and 5.5 months in the  olaparib and placebo arms, respectively.   Study 19 ([id number]) randomized 265 patients regardless of BRCA status (1:1) to receive olaparib capsules 400 mg orally twice daily or placebo. Study 19 demonstrated a statistically significant improvement in investigator-assessed PFS in patients treated with olaparib vs. placebo with a HR of 0.35 (95% CI: 0.25, 0.49; p<0.0001). The estimated median PFS was 8.4 months and 4.8 months in the olaparib and placebo arms, respectively.   The most common adverse reactions (?20%) in clinical trials were anemia, nausea, fatigue (including asthenia), vomiting, nasopharyngitis, diarrhea, arthralgia/myalgia, dysgeusia, headache, dyspepsia, decreased appetite, constipation, and stomatitis. The most common laboratory abnormalities (?25%) were decrease in hemoglobin, increase in mean corpuscular volume, decrease in lymphocytes, decrease in leukocytes, decrease in absolute neutrophil count, increase in serum creatinine, and decrease in platelets.  The patient is aware that the response rates discussed earlier is not guaranteed.  After a long discussion, patient made an informed decision to proceed with the prescribed plan of care.   Due to her history of chronic pancytopenia, I plan to reduce dose treatment upfront She understood once we start her on treatment, within the first month, she might have to come here on a weekly basis for toxicity review Once she has stable blood count while on treatment, we will space out her future appointment I plan to repeat imaging study again within 3 months for objective assessment of response to therapy  Pancytopenia, acquired [facility]) She has history of pancytopenia Plan upfront dose reduce olaparib  Neuropathy, idiopathic She has idiopathic neuropathy even before treatment She had recent falls I recommend home physical therapy and rehab I have made the referral around Christmas time but she has not been seen We will call advanced  home care services again for further follow-up She takes chronic hydrocodone for this pain I refill her prescription today We discussed narcotic refill policy  Goals of  care, counseling/discussion We discussed the role of ongoing treatment with chemotherapy She understood the goals of care   No orders of the defined types were placed in this encounter.   All questions were answered. The patient knows to call the clinic with any problems, questions or concerns. The total time spent in the appointment was 40 minutes encounter with patients including review of chart and various tests results, discussions about plan of care and coordination of care plan   Heath Lark, MD 12/01/2019 1:36 PM  INTERVAL HISTORY: Please see below for problem oriented charting. She returns for further follow-up of test results Since last time I saw her, she have occasional nausea She took low-density on as needed Her chronic pain is stable Unfortunately, nobody has called her to set up home physical therapy since last time I saw her She had one episode of fall but did not sustain major injuries She still have persistent neuropathy but not worse No recent infection, fever or chills  SUMMARY OF ONCOLOGIC HISTORY: Oncology History Overview Note  BRCA1 positive   Cancer of right colon ([place])  09/16/2014 Imaging   CT abd/pel:  a 3.8cm cecal mass with associated ileocecal intussusception, and a 9.1cm solid and cystic mass in the left pelvis,   09/18/2014 Initial Diagnosis   Adenocarcinoma of right colon   09/18/2014 Pathologic Stage   pT3pN1Mx, tumor extends into pericolonic soft tissue and is less than 76m from the serosal surface, LVI 8-), PNI (-), all 23 node negative, one soft tissue tumor deposit, surgical margins negative.    09/18/2014 Surgery   right hemicolectomy and terminal ileoectomy   08/06/2016 Imaging   CT chest/abd/pelvis with contrast IMPRESSION: 1. No recurrent malignancy  identified. 2. 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 On the prior exam from 09/13/2015 there was back flow of contrast in the left external iliac artery to supply the left internal iliac artery ; this back flow of contrast is no longer present in the left external iliac artery is occluded. 3. Several pulmonary nodules at or below 4 mm diameter, unchanged. 4. 3 mm hypodensity posteriorly in the body of the pancreas, not well seen previously and subtle today, likely incidental, but merits observation. 5. 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 Prominent degenerative spurring of both acetabula. 6. Lumbar spondylosis and degenerative disc disease causing prominent impingement at L4-5 and mild impingement at L5-S1.   03/08/2017 Imaging   CT CAP W Contrast 03/08/17 IMPRESSION: Chest Impression: 1. Stable small pulmonary nodules within LEFT lung. 2. No mediastinal lymphadenopathy. Abdomen / Pelvis Impression: 1. No evidence of metastatic colorectal cancer or ovarian cancer within the abdomen pelvis. 2. No lymphadenopathy. 3. Postsurgical change consistent RIGHT hemicolectomy and hysterectomy 4. A fem-fem arterial bypass noted.   11/13/2017 Imaging   CT AP w contrast IMPRESSION: 1. No findings of recurrent malignancy in the abdomen or pelvis. 2. Other imaging findings of potential clinical significance: Chronic AVN of both femoral heads without contour abnormality. Lumbar impingement at L4-5 and L5-S1. Aortic Atherosclerosis (ICD10-I70.0). 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 Partial right hemicolectomy. Hysterectomy.   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 This was discovered during her workup for colon cancer.   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 Tumor size 3.7 cm, no lymph nodes removed. T1cNx. Tumor cells are positive for cytokeratin 7 and estrogen receptor.   11/28/2014 Cancer Staging   Staging form: Ovary, AJCC 7th Edition - Clinical: Stage III (rT3, N0, M0) - Signed by Heath Lark, MD on 06/09/2019   03/08/2017 Imaging   CT CAP W Contrast 03/08/17 IMPRESSION: Chest Impression: 1. Stable small pulmonary nodules within LEFT lung. 2. No mediastinal lymphadenopathy. Abdomen / Pelvis Impression: 1. No evidence of metastatic colorectal cancer or ovarian cancer within the abdomen pelvis. 2. No lymphadenopathy. 3. Postsurgical change consistent RIGHT hemicolectomy and hysterectomy 4. A fem-fem arterial bypass noted.   03/12/2017 Mammogram   MM Right Breast 03/12/17 IMPRESSION: No mammographic or sonographic evidence of malignancy. No correlate identified for the abnormal enhancement seen within the right nipple on MRI of 02/27/2017.    04/02/2017 Pathology Results   Surgical Pathology of Nipple Biopsy: 04/02/17 Diagnosis Nipple Biopsy, Right - BENIGN NIPPLE TISSUE. - NO ATYPIA OR MALIGNANCY.    11/13/2017 Imaging   1. No findings of recurrent malignancy in the abdomen or pelvis. 2. Other imaging findings of potential clinical significance: Chronic AVN of both femoral heads without contour abnormality. Lumbar impingement at L4-5 and L5-S1. Aortic Atherosclerosis (ICD10-I70.0). 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 Partial right hemicolectomy. Hysterectomy.   03/22/2018 Mammogram   Screening mammogram IMPRESSION No evidence of malignancy.     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   06/06/2019 Imaging   1. Tiny bilateral pulmonary nodules stable since 11/12/2018. Continued close attention on follow-up recommended. 2. Incompletely visualized upper abdominal lymphadenopathy better characterized on the abdomen CT from 2 weeks ago. 3.  Aortic Atherosclerois (ICD10-170.0) 4.  Emphysema. ([id number])   06/07/2019 Pathology Results   Soft Tissue Needle Core Biopsy, peritoneal nodule within right lower abdomen - METASTATIC CARCINOMA CONSISTENT WITH PATIENT'S CLINICAL HISTORY OF PRIMARY OVARIAN CARCINOMA. SEE NOTE Diagnosis Note Immunohistochemical stains show that the tumor cells are positive for CK7, ER and PAX 8; and negative for CK20 and CDX2. This immunostaining profile is consistent with above diagnosis.   06/07/2019 Procedure   Technically successful CT guided core needle biopsy of indeterminate peritoneal nodule within the right lower abdomen/pelvis.   06/20/2019 - 11/14/2019 Chemotherapy   The patient had carboplatin and taxol   07/11/2019 Tumor Marker   Patient's tumor was tested for the following markers: CA-125 Results of the tumor marker test revealed 438   08/05/2019 Tumor Marker   Patient's tumor was tested for the following markers: CA-125 Results of the tumor marker test revealed 272.   08/25/2019 Imaging   Ct chest, abdomen and pelvis 1. Slight interval decrease in size of a right lower quadrant peritoneal nodule measuring 1.7 x 1.6 cm, previously 2.2 x 2.2 cm (series 2, image 94, series 5, image 58). Findings are consistent  with treatment response.    2. No significant change in ill-defined soft tissue about the sigmoid colon in the hysterectomy bed (series 2, image 101).   3.  Status post hysterectomy.   4. No evidence of metastatic disease in the chest. Stable benign small pulmonary nodules.   5.  Coronary artery disease.  Aortic Atherosclerosis (ICD10-I70.0).     08/26/2019 Tumor Marker   Patient's tumor was tested for the following markers: CA-125 Results of the tumor marker test revealed 189.   09/26/2019 Tumor Marker   Patient's tumor was tested for the following markers: CA-125 Results of the tumor marker test revealed 137   10/03/2019 Tumor Marker   Patient's tumor was tested for the following markers: CA-125 Results of the tumor marker test revealed 137.   10/31/2019 Tumor Marker   Patient's tumor was tested for the following markers: CA-125 Results of the tumor marker test revealed 101   11/29/2019 Imaging   1. Right lower quadrant peritoneal implant is decreased. Stable irregular nodular pelvic peritoneal thickening superior to the vaginal cuff. 2. No new or progressive metastatic disease. No abdominopelvic lymphadenopathy. 3.  Aortic Atherosclerosis (ICD10-I70.0).     REVIEW OF SYSTEMS:   Constitutional: Denies fevers, chills or abnormal weight loss Eyes: Denies blurriness of vision Ears, nose, mouth, throat, and face: Denies mucositis or sore throat Respiratory: Denies cough, dyspnea or wheezes Cardiovascular: Denies palpitation, chest discomfort or lower extremity swelling Skin: Denies abnormal skin rashes Lymphatics: Denies new lymphadenopathy or easy bruising Behavioral/Psych: Mood is stable, no new changes  All other systems were reviewed with the patient and are negative.  I have reviewed the past medical history, past surgical history, social history and family history with the patient and they are unchanged from previous note.  ALLERGIES:  is allergic to other; demerol [meperidine]; morphine and  related; oxycodone; penicillins; strawberry flavor; tylenol [acetaminophen]; and ibuprofen.  MEDICATIONS:  Current Outpatient Medications  Medication Sig Dispense Refill  . acetaminophen (TYLENOL) 325 MG tablet Take 325 mg by mouth as needed.    . docusate sodium 100 MG CAPS Take 100 mg by mouth 2 (two) times daily as needed for mild constipation. 10 capsule 0  . HYDROcodone-acetaminophen (NORCO/VICODIN) 5-325 MG tablet Take 1 tablet by mouth every 6 (six) hours as needed for moderate pain. 60 tablet 0  . lidocaine (LIDODERM) 5 % PLACE 1 PATCH ONTO THE SKIN EVERY DAY.REMOVE AND DISCARD PATCH WITHIN 12 HOURS OR AS DIRECTED BY PRESCRIBER 30 patch 1  . LORazepam (ATIVAN) 0.5 MG tablet Take 1 tablet (0.5 mg total) by mouth 3 (three) times daily as needed for anxiety. 10 tablet 0  . losartan (COZAAR) 50 MG tablet Take 1 tablet (50 mg total) by mouth daily. (Patient not taking: Reported on 11/14/2019) 90 tablet 2  . olaparib (LYNPARZA) 100 MG tablet Take 2 tablets (200 mg total) by mouth 2 (two) times daily. Swallow whole. May take with food to decrease nausea and vomiting. 120 tablet 11  . ondansetron (ZOFRAN) 8 MG tablet Take 1 tablet (8 mg total) by mouth every 8 (eight) hours as needed for nausea or vomiting. 30 tablet 1  . pregabalin (LYRICA) 200 MG capsule Take 1 capsule (200 mg total) by mouth every 8 (eight) hours. Please give patient brand name Lyrica 90 capsule 5  . prochlorperazine (COMPAZINE) 10 MG tablet Take 1 tablet (10 mg total) by mouth every 6 (six) hours as needed (Nausea or vomiting). 30 tablet 1   No current facility-administered medications  for this visit.    PHYSICAL EXAMINATION: ECOG PERFORMANCE STATUS: 1 - Symptomatic but completely ambulatory  Vitals:   12/01/19 1159  BP: (!) 136/91  Pulse: 76  Resp: 18  Temp: (!) 97.3 F (36.3 C)  SpO2: 100%   Filed Weights   12/01/19 1159  Weight: 168 lb 3.2 oz (76.3 kg)    GENERAL:alert, no distress and  comfortable Musculoskeletal:no cyanosis of digits and no clubbing  NEURO: alert & oriented x 3 with fluent speech, no focal motor/sensory deficits  LABORATORY DATA:  I have reviewed the data as listed    Component Value Date/Time   NA 144 11/14/2019 1009   NA 141 10/07/2017 1436   K 4.1 11/14/2019 1009   K 4.4 10/07/2017 1436   CL 109 11/14/2019 1009   CO2 22 11/14/2019 1009   CO2 26 10/07/2017 1436   GLUCOSE 238 (H) 11/14/2019 1009   GLUCOSE 85 10/07/2017 1436   BUN 14 11/14/2019 1009   BUN 14.9 10/07/2017 1436   CREATININE 0.85 11/14/2019 1009   CREATININE 1.0 10/07/2017 1436   CALCIUM 9.3 11/14/2019 1009   CALCIUM 9.5 10/07/2017 1436   PROT 7.3 11/14/2019 1009   PROT 7.8 10/07/2017 1436   ALBUMIN 4.0 11/14/2019 1009   ALBUMIN 3.9 10/07/2017 1436   AST 13 (L) 11/14/2019 1009   AST 15 10/07/2017 1436   ALT 8 11/14/2019 1009   ALT 7 10/07/2017 1436   ALKPHOS 61 11/14/2019 1009   ALKPHOS 81 10/07/2017 1436   BILITOT 0.3 11/14/2019 1009   BILITOT 0.54 10/07/2017 1436   GFRNONAA >60 11/14/2019 1009   GFRAA >60 11/14/2019 1009    No results found for: SPEP, UPEP  Lab Results  Component Value Date   WBC 3.3 (L) 11/14/2019   NEUTROABS 2.7 11/14/2019   HGB 11.0 (L) 11/14/2019   HCT 31.9 (L) 11/14/2019   MCV 115.2 (H) 11/14/2019   PLT 151 11/14/2019      Chemistry      Component Value Date/Time   NA 144 11/14/2019 1009   NA 141 10/07/2017 1436   K 4.1 11/14/2019 1009   K 4.4 10/07/2017 1436   CL 109 11/14/2019 1009   CO2 22 11/14/2019 1009   CO2 26 10/07/2017 1436   BUN 14 11/14/2019 1009   BUN 14.9 10/07/2017 1436   CREATININE 0.85 11/14/2019 1009   CREATININE 1.0 10/07/2017 1436      Component Value Date/Time   CALCIUM 9.3 11/14/2019 1009   CALCIUM 9.5 10/07/2017 1436   ALKPHOS 61 11/14/2019 1009   ALKPHOS 81 10/07/2017 1436   AST 13 (L) 11/14/2019 1009   AST 15 10/07/2017 1436   ALT 8 11/14/2019 1009   ALT 7 10/07/2017 1436   BILITOT 0.3  11/14/2019 1009   BILITOT 0.54 10/07/2017 1436       RADIOGRAPHIC STUDIES: I have reviewed multiple imaging studies with the patient I have personally reviewed the radiological images as listed and agreed with the findings in the report. CT Abdomen Pelvis W Contrast  Result Date: 11/29/2019 CLINICAL DATA:  Stage IV left ovarian cancer status post TAHBSO 12/26/2014 with ongoing chemotherapy. Restaging. EXAM: CT ABDOMEN AND PELVIS WITH CONTRAST TECHNIQUE: Multidetector CT imaging of the abdomen and pelvis was performed using the standard protocol following bolus administration of intravenous contrast. CONTRAST:  18m OMNIPAQUE IOHEXOL 300 MG/ML  SOLN COMPARISON:  08/25/2019 CT chest, abdomen and pelvis. FINDINGS: Lower chest: No significant pulmonary nodules or acute consolidative airspace disease. Hepatobiliary: Normal  liver size. No liver mass. Normal gallbladder with no radiopaque cholelithiasis. No biliary ductal dilatation. Pancreas: Normal, with no mass or duct dilation. Spleen: Normal size. No mass. Adrenals/Urinary Tract: Normal adrenals. No hydronephrosis. Subcentimeter hypodense renal cortical lesion in the lateral lower left kidney is too small to characterize and is unchanged. No new renal lesions. Normal bladder. Stomach/Bowel: Normal non-distended stomach. Stable postsurgical changes from subtotal right hemicolectomy with intact appearing ileocolic anastomosis in the right abdomen. No dilated or thick-walled small bowel loops. Oral contrast transits to the colon. No wall thickening in the remnant large-bowel or pericolonic fat stranding. Vascular/Lymphatic: Atherosclerotic nonaneurysmal abdominal aorta. Patent portal, splenic, hepatic and renal veins. 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 Patent femoral femoral arterial bypass graft. No pathologically enlarged lymph nodes in the abdomen or pelvis. Reproductive: Status post hysterectomy. Irregular nodular peritoneal thickening  superior to the vaginal cuff anterior to the rectum measures 3.0 x 1.3 cm (series 2/image 62), previously 3.0 x 1.3 cm, stable. No adnexal mass. Other: No pneumoperitoneum, ascites or focal fluid collection. Right lower quadrant peritoneal 1.3 x 1.0 cm soft tissue implant (series 2/image 54), decreased from 1.9 x 1.4 cm using similar measurement technique. Musculoskeletal: No aggressive appearing focal osseous lesions. Moderate thoracolumbar spondylosis. IMPRESSION: 1. Right lower quadrant peritoneal implant is decreased. Stable irregular nodular pelvic peritoneal thickening superior to the vaginal cuff. 2. No new or progressive metastatic disease. No abdominopelvic lymphadenopathy. 3.  Aortic Atherosclerosis (ICD10-I70.0). Electronically Signed   By: Ilona Sorrel M.D.   On: 11/29/2019 15:31

## 2019-12-01 NOTE — Assessment & Plan Note (Signed)
We discussed the role of ongoing treatment with chemotherapy She understood the goals of care

## 2019-12-01 NOTE — Assessment & Plan Note (Signed)
She has idiopathic neuropathy even before treatment She had recent falls I recommend home physical therapy and rehab I have made the referral around Christmas time but she has not been seen We will call advanced home care services again for further follow-up She takes chronic hydrocodone for this pain I refill her prescription today We discussed narcotic refill policy

## 2019-12-05 MED FILL — LYNPARZA 100 MG TAB: 100 | 30 days supply | Qty: 120 | Fill #0

## 2019-12-08 ENCOUNTER — Other Ambulatory Visit: Payer: Self-pay | Admitting: Hematology

## 2019-12-08 ENCOUNTER — Telehealth: Payer: Self-pay | Admitting: Hematology and Oncology

## 2019-12-08 DIAGNOSIS — Z1501 Genetic susceptibility to malignant neoplasm of breast: Secondary | ICD-10-CM

## 2019-12-08 NOTE — Telephone Encounter (Signed)
Scheduled per 1/13 sch msg. Called and left msg. Mailing printout

## 2019-12-12 ENCOUNTER — Other Ambulatory Visit: Payer: Medicare Other

## 2019-12-13 ENCOUNTER — Telehealth: Payer: Self-pay | Admitting: Hematology and Oncology

## 2019-12-13 ENCOUNTER — Ambulatory Visit: Payer: Medicare Other | Admitting: Hematology and Oncology

## 2019-12-13 NOTE — Telephone Encounter (Signed)
Returned patient's phone call regarding rescheduling an appointment, left a voicemail. 

## 2019-12-14 ENCOUNTER — Telehealth: Payer: Self-pay | Admitting: Hematology and Oncology

## 2019-12-14 NOTE — Telephone Encounter (Signed)
Patient returned phone call regarding rescheduling 01/21 appointment, per patient's request appointment has moved to 01/29.

## 2019-12-15 ENCOUNTER — Other Ambulatory Visit: Payer: Medicare Other

## 2019-12-15 ENCOUNTER — Ambulatory Visit: Payer: Medicare Other | Admitting: Hematology and Oncology

## 2019-12-23 ENCOUNTER — Inpatient Hospital Stay (HOSPITAL_BASED_OUTPATIENT_CLINIC_OR_DEPARTMENT_OTHER): Payer: Medicare Other | Admitting: Hematology and Oncology

## 2019-12-23 ENCOUNTER — Encounter: Payer: Self-pay | Admitting: Hematology and Oncology

## 2019-12-23 ENCOUNTER — Inpatient Hospital Stay: Payer: Medicare Other

## 2019-12-23 ENCOUNTER — Other Ambulatory Visit: Payer: Self-pay

## 2019-12-23 DIAGNOSIS — G893 Neoplasm related pain (acute) (chronic): Secondary | ICD-10-CM

## 2019-12-23 DIAGNOSIS — G609 Hereditary and idiopathic neuropathy, unspecified: Secondary | ICD-10-CM | POA: Diagnosis not present

## 2019-12-23 DIAGNOSIS — D61818 Other pancytopenia: Secondary | ICD-10-CM

## 2019-12-23 DIAGNOSIS — C562 Malignant neoplasm of left ovary: Secondary | ICD-10-CM

## 2019-12-23 DIAGNOSIS — C182 Malignant neoplasm of ascending colon: Secondary | ICD-10-CM | POA: Diagnosis not present

## 2019-12-23 DIAGNOSIS — C786 Secondary malignant neoplasm of retroperitoneum and peritoneum: Secondary | ICD-10-CM | POA: Diagnosis not present

## 2019-12-23 DIAGNOSIS — R110 Nausea: Secondary | ICD-10-CM | POA: Diagnosis not present

## 2019-12-23 LAB — CMP (CANCER CENTER ONLY)
ALT: <6 U/L (ref 0–44)
AST: 12 U/L — ABNORMAL LOW (ref 15–41)
Albumin: 4.1 g/dL (ref 3.5–5.0)
Alkaline Phosphatase: 64 U/L (ref 38–126)
Anion gap: 7 (ref 5–15)
BUN: 15 mg/dL (ref 6–20)
CO2: 27 mmol/L (ref 22–32)
Calcium: 9.1 mg/dL (ref 8.9–10.3)
Chloride: 110 mmol/L (ref 98–111)
Creatinine: 0.96 mg/dL (ref 0.44–1.00)
GFR, Est AFR Am: >60 mL/min (ref >60)
GFR, Estimated: >60 mL/min (ref >60)
Glucose, Bld: 89 mg/dL (ref 70–99)
Potassium: 3.7 mmol/L (ref 3.5–5.1)
Sodium: 144 mmol/L (ref 135–145)
Total Bilirubin: 0.4 mg/dL (ref 0.3–1.2)
Total Protein: 6.9 g/dL (ref 6.5–8.1)

## 2019-12-23 LAB — CBC WITH DIFFERENTIAL (CANCER CENTER ONLY)
Abs Immature Granulocytes: 0.01 10*3/uL (ref 0.00–0.07)
Basophils Absolute: 0 10*3/uL (ref 0.0–0.1)
Basophils Relative: 1 %
Eosinophils Absolute: 0 10*3/uL (ref 0.0–0.5)
Eosinophils Relative: 1 %
HCT: 32 % — ABNORMAL LOW (ref 36.0–46.0)
Hemoglobin: 11.2 g/dL — ABNORMAL LOW (ref 12.0–15.0)
Immature Granulocytes: 0 %
Lymphocytes Relative: 51 %
Lymphs Abs: 2 10*3/uL (ref 0.7–4.0)
MCH: 38.8 pg — ABNORMAL HIGH (ref 26.0–34.0)
MCHC: 35 g/dL (ref 30.0–36.0)
MCV: 110.7 fL — ABNORMAL HIGH (ref 80.0–100.0)
Monocytes Absolute: 0.4 10*3/uL (ref 0.1–1.0)
Monocytes Relative: 9 %
Neutro Abs: 1.4 10*3/uL — ABNORMAL LOW (ref 1.7–7.7)
Neutrophils Relative %: 38 %
Platelet Count: 106 10*3/uL — ABNORMAL LOW (ref 150–400)
RBC: 2.89 MIL/uL — ABNORMAL LOW (ref 3.87–5.11)
RDW: 14.1 % (ref 11.5–15.5)
WBC Count: 3.8 10*3/uL — ABNORMAL LOW (ref 4.0–10.5)
nRBC: 0 % (ref 0.0–0.2)

## 2019-12-23 MED ORDER — HYDROCODONE-ACETAMINOPHEN 5-325 MG PO TABS: 1.0000 | ORAL_TABLET | Freq: Four times a day (QID) | ORAL | 0 refills | Status: DC | PRN

## 2019-12-23 MED ORDER — PROCHLORPERAZINE MALEATE 10 MG PO TABS: 10.0000 mg | ORAL_TABLET | Freq: Four times a day (QID) | ORAL | 1 refills | Status: DC | PRN

## 2019-12-23 NOTE — Progress Notes (Signed)
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 [facility]) Previously, we discussed the risk and benefits of Lynparza She tolerated treatment well so far Her pancytopenia is actually improving She will continue weekly labs x2 and I will see her back in 3 weeks for toxicity review I recommend minimum 3 months of treatment before repeat imaging study, probably due end of April and she agreed  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   All questions were answered. The patient knows to call the clinic with any problems, questions or concerns. The total time spent in the appointment was 20 minutes encounter with patients including review of chart and various tests results, discussions about plan of care and coordination of care plan   Heath Lark, MD 12/23/2019 1:21 PM  INTERVAL HISTORY: Please see below for problem oriented charting. She returns for further follow-up She tolerated the treatment well without major side  effects apart from some mild nausea which is alleviated by antiemetics No recent diarrhea Denies recent infection, fever or chills No worsening pain no neuropathy No recent bleeding  SUMMARY OF ONCOLOGIC HISTORY: Oncology History Overview Note  BRCA1 positive   Cancer of right colon ([place])  09/16/2014 Imaging   CT abd/pel:  a 3.8cm cecal mass with associated ileocecal intussusception, and a 9.1cm solid and cystic mass in the left pelvis,   09/18/2014 Initial Diagnosis   Adenocarcinoma of right colon   09/18/2014 Pathologic Stage   pT3pN1Mx, tumor extends into pericolonic soft tissue and is less than 42m from the serosal surface, LVI 8-), PNI (-), all 23 node negative, one soft tissue tumor deposit, surgical margins negative.    09/18/2014 Surgery   right hemicolectomy and terminal ileoectomy   08/06/2016 Imaging   CT chest/abd/pelvis with contrast IMPRESSION: 1. No recurrent malignancy identified. 2. 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 On the prior exam from 09/13/2015 there was back flow of contrast in the left external iliac artery to supply the left internal iliac artery ; this back flow of contrast is no longer present in the left external iliac artery is occluded. 3. Several pulmonary nodules at or below 4 mm diameter, unchanged. 4. 3 mm hypodensity posteriorly in the body of the pancreas, not well seen previously and subtle today, likely incidental, but merits observation. 5. 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 Prominent degenerative spurring of both acetabula. 6. Lumbar spondylosis and degenerative disc disease causing prominent impingement at L4-5 and mild impingement at L5-S1.   03/08/2017 Imaging   CT CAP W Contrast 03/08/17 IMPRESSION: Chest  Impression: 1. Stable small pulmonary nodules within LEFT lung. 2. No mediastinal lymphadenopathy. Abdomen / Pelvis  Impression: 1. No evidence of metastatic colorectal cancer or ovarian cancer within the abdomen pelvis. 2. No lymphadenopathy. 3. Postsurgical change consistent RIGHT hemicolectomy and hysterectomy 4. A fem-fem arterial bypass noted.   11/13/2017 Imaging   CT AP w contrast IMPRESSION: 1. No findings of recurrent malignancy in the abdomen or pelvis. 2. Other imaging findings of potential clinical significance: Chronic AVN of both femoral heads without contour abnormality. Lumbar impingement at L4-5 and L5-S1. Aortic Atherosclerosis (ICD10-I70.0). 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 Partial right hemicolectomy. Hysterectomy.   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 This was discovered during her workup for colon cancer.   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 Tumor size 3.7 cm, no lymph nodes removed. T1cNx. Tumor cells are positive for cytokeratin 7 and estrogen receptor.   11/28/2014 Cancer Staging   Staging form: Ovary, AJCC 7th Edition - Clinical: Stage III (rT3, N0, M0) - Signed by Heath Lark, MD on 06/09/2019   03/08/2017 Imaging   CT CAP W Contrast 03/08/17 IMPRESSION: Chest Impression: 1. Stable small pulmonary nodules within LEFT lung. 2. No mediastinal lymphadenopathy. Abdomen / Pelvis Impression: 1. No evidence of metastatic colorectal cancer or ovarian cancer within the abdomen pelvis. 2. No  lymphadenopathy. 3. Postsurgical change consistent RIGHT hemicolectomy and hysterectomy 4. A fem-fem arterial bypass noted.   03/12/2017 Mammogram   MM Right Breast 03/12/17 IMPRESSION: No mammographic or sonographic evidence of malignancy. No correlate identified for the abnormal enhancement seen within the right nipple on MRI of 02/27/2017.    04/02/2017 Pathology Results   Surgical Pathology of Nipple Biopsy: 04/02/17 Diagnosis Nipple Biopsy, Right - BENIGN NIPPLE TISSUE. - NO ATYPIA OR MALIGNANCY.    11/13/2017 Imaging   1. No findings of recurrent malignancy in the abdomen or pelvis. 2. Other imaging findings of potential clinical significance: Chronic AVN of both femoral heads without contour abnormality. Lumbar impingement at L4-5 and L5-S1. Aortic Atherosclerosis (ICD10-I70.0). 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 Partial right hemicolectomy. Hysterectomy.   03/22/2018 Mammogram   Screening mammogram IMPRESSION No evidence of malignancy.     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   06/06/2019 Imaging   1. Tiny bilateral pulmonary nodules stable since 11/12/2018. Continued close attention on follow-up recommended. 2. Incompletely visualized upper abdominal lymphadenopathy better characterized on the abdomen CT from 2 weeks ago. 3.  Aortic Atherosclerois (ICD10-170.0) 4.  Emphysema. ([id number])   06/07/2019 Pathology Results   Soft Tissue Needle Core Biopsy, peritoneal nodule within right lower abdomen - METASTATIC CARCINOMA CONSISTENT WITH PATIENT'S CLINICAL HISTORY OF PRIMARY OVARIAN  CARCINOMA. SEE NOTE Diagnosis Note Immunohistochemical stains show that the tumor cells  are positive for CK7, ER and PAX 8; and negative for CK20 and CDX2. This immunostaining profile is consistent with above diagnosis.   06/07/2019 Procedure   Technically successful CT guided core needle biopsy of indeterminate peritoneal nodule within the right lower abdomen/pelvis.   06/20/2019 - 11/14/2019 Chemotherapy   The patient had carboplatin and taxol   07/11/2019 Tumor Marker   Patient's tumor was tested for the following markers: CA-125 Results of the tumor marker test revealed 438   08/05/2019 Tumor Marker   Patient's tumor was tested for the following markers: CA-125 Results of the tumor marker test revealed 272.   08/25/2019 Imaging   Ct chest, abdomen and pelvis 1. Slight interval decrease in size of a right lower quadrant peritoneal nodule measuring 1.7 x 1.6 cm, previously 2.2 x 2.2 cm (series 2, image 94, series 5, image 58). Findings are consistent with treatment response.   2. No significant change in ill-defined soft tissue about the sigmoid colon in the hysterectomy bed (series 2, image 101).   3.  Status post hysterectomy.   4. No evidence of metastatic disease in the chest. Stable benign small pulmonary nodules.   5.  Coronary artery disease.  Aortic Atherosclerosis (ICD10-I70.0).     08/26/2019 Tumor Marker   Patient's tumor was tested for the following markers: CA-125 Results of the tumor marker test revealed 189.   09/26/2019 Tumor Marker   Patient's tumor was tested for the following markers: CA-125 Results of the tumor marker test revealed 137   10/03/2019 Tumor Marker   Patient's tumor was tested for the following markers: CA-125 Results of the tumor marker test revealed 137.   10/31/2019 Tumor Marker   Patient's tumor was tested for the following markers: CA-125 Results of the tumor marker test revealed 101   11/29/2019 Imaging   1. Right lower quadrant peritoneal implant is decreased. Stable irregular nodular pelvic peritoneal thickening superior to  the vaginal cuff. 2. No new or progressive metastatic disease. No abdominopelvic lymphadenopathy. 3.  Aortic Atherosclerosis (ICD10-I70.0).     REVIEW OF SYSTEMS:   Constitutional: Denies fevers, chills or abnormal weight loss Eyes: Denies blurriness of vision Ears, nose, mouth, throat, and face: Denies mucositis or sore throat Respiratory: Denies cough, dyspnea or wheezes Cardiovascular: Denies palpitation, chest discomfort or lower extremity swelling Skin: Denies abnormal skin rashes Lymphatics: Denies new lymphadenopathy or easy bruising Neurological:Denies numbness, tingling or new weaknesses Behavioral/Psych: Mood is stable, no new changes  All other systems were reviewed with the patient and are negative.  I have reviewed the past medical history, past surgical history, social history and family history with the patient and they are unchanged from previous note.  ALLERGIES:  is allergic to other; demerol [meperidine]; morphine and related; oxycodone; penicillins; strawberry flavor; tylenol [acetaminophen]; and ibuprofen.  MEDICATIONS:  Current Outpatient Medications  Medication Sig Dispense Refill  . acetaminophen (TYLENOL) 325 MG tablet Take 325 mg by mouth as needed.    . docusate sodium 100 MG CAPS Take 100 mg by mouth 2 (two) times daily as needed for mild constipation. 10 capsule 0  . HYDROcodone-acetaminophen (NORCO/VICODIN) 5-325 MG tablet Take 1 tablet by mouth every 6 (six) hours as needed for moderate pain. 60 tablet 0  . lidocaine (LIDODERM) 5 % PLACE 1 PATCH ONTO THE SKIN EVERY DAY.REMOVE AND DISCARD PATCH WITHIN 12 HOURS OR AS DIRECTED BY PRESCRIBER 30 patch 1  . LORazepam (ATIVAN)  0.5 MG tablet Take 1 tablet (0.5 mg total) by mouth 3 (three) times daily as needed for anxiety. 10 tablet 0  . losartan (COZAAR) 50 MG tablet Take 1 tablet (50 mg total) by mouth daily. (Patient not taking: Reported on 11/14/2019) 90 tablet 2  . olaparib (LYNPARZA) 100 MG tablet Take 2  tablets (200 mg total) by mouth 2 (two) times daily. Swallow whole. May take with food to decrease nausea and vomiting. 120 tablet 11  . ondansetron (ZOFRAN) 8 MG tablet Take 1 tablet (8 mg total) by mouth every 8 (eight) hours as needed for nausea or vomiting. 30 tablet 1  . pregabalin (LYRICA) 200 MG capsule Take 1 capsule (200 mg total) by mouth every 8 (eight) hours. Please give patient brand name Lyrica 90 capsule 5  . prochlorperazine (COMPAZINE) 10 MG tablet Take 1 tablet (10 mg total) by mouth every 6 (six) hours as needed (Nausea or vomiting). 90 tablet 1   No current facility-administered medications for this visit.    PHYSICAL EXAMINATION: ECOG PERFORMANCE STATUS: 1 - Symptomatic but completely ambulatory  Vitals:   12/23/19 1228  BP: 124/73  Pulse: 72  Resp: 18  Temp: 98.2 F (36.8 C)  SpO2: 100%   Filed Weights   12/23/19 1228  Weight: 164 lb 3.2 oz (74.5 kg)    GENERAL:alert, no distress and comfortable Musculoskeletal:no cyanosis of digits and no clubbing  NEURO: alert & oriented x 3 with fluent speech, no focal motor/sensory deficits  LABORATORY DATA:  I have reviewed the data as listed    Component Value Date/Time   NA 144 12/23/2019 1159   NA 141 10/07/2017 1436   K 3.7 12/23/2019 1159   K 4.4 10/07/2017 1436   CL 110 12/23/2019 1159   CO2 27 12/23/2019 1159   CO2 26 10/07/2017 1436   GLUCOSE 89 12/23/2019 1159   GLUCOSE 85 10/07/2017 1436   BUN 15 12/23/2019 1159   BUN 14.9 10/07/2017 1436   CREATININE 0.96 12/23/2019 1159   CREATININE 1.0 10/07/2017 1436   CALCIUM 9.1 12/23/2019 1159   CALCIUM 9.5 10/07/2017 1436   PROT 6.9 12/23/2019 1159   PROT 7.8 10/07/2017 1436   ALBUMIN 4.1 12/23/2019 1159   ALBUMIN 3.9 10/07/2017 1436   AST 12 (L) 12/23/2019 1159   AST 15 10/07/2017 1436   ALT <6 12/23/2019 1159   ALT 7 10/07/2017 1436   ALKPHOS 64 12/23/2019 1159   ALKPHOS 81 10/07/2017 1436   BILITOT 0.4 12/23/2019 1159   BILITOT 0.54 10/07/2017  1436   GFRNONAA >60 12/23/2019 1159   GFRAA >60 12/23/2019 1159    No results found for: SPEP, UPEP  Lab Results  Component Value Date   WBC 3.8 (L) 12/23/2019   NEUTROABS 1.4 (L) 12/23/2019   HGB 11.2 (L) 12/23/2019   HCT 32.0 (L) 12/23/2019   MCV 110.7 (H) 12/23/2019   PLT 106 (L) 12/23/2019      Chemistry      Component Value Date/Time   NA 144 12/23/2019 1159   NA 141 10/07/2017 1436   K 3.7 12/23/2019 1159   K 4.4 10/07/2017 1436   CL 110 12/23/2019 1159   CO2 27 12/23/2019 1159   CO2 26 10/07/2017 1436   BUN 15 12/23/2019 1159   BUN 14.9 10/07/2017 1436   CREATININE 0.96 12/23/2019 1159   CREATININE 1.0 10/07/2017 1436      Component Value Date/Time   CALCIUM 9.1 12/23/2019 1159   CALCIUM 9.5 10/07/2017  1436   ALKPHOS 64 12/23/2019 1159   ALKPHOS 81 10/07/2017 1436   AST 12 (L) 12/23/2019 1159   AST 15 10/07/2017 1436   ALT <6 12/23/2019 1159   ALT 7 10/07/2017 1436   BILITOT 0.4 12/23/2019 1159   BILITOT 0.54 10/07/2017 1436       RADIOGRAPHIC STUDIES: I have personally reviewed the radiological images as listed and agreed with the findings in the report. CT Abdomen Pelvis W Contrast  Result Date: 11/29/2019 CLINICAL DATA:  Stage IV left ovarian cancer status post TAHBSO 12/26/2014 with ongoing chemotherapy. Restaging. EXAM: CT ABDOMEN AND PELVIS WITH CONTRAST TECHNIQUE: Multidetector CT imaging of the abdomen and pelvis was performed using the standard protocol following bolus administration of intravenous contrast. CONTRAST:  1105m OMNIPAQUE IOHEXOL 300 MG/ML  SOLN COMPARISON:  08/25/2019 CT chest, abdomen and pelvis. FINDINGS: Lower chest: No significant pulmonary nodules or acute consolidative airspace disease. Hepatobiliary: Normal liver size. No liver mass. Normal gallbladder with no radiopaque cholelithiasis. No biliary ductal dilatation. Pancreas: Normal, with no mass or duct dilation. Spleen: Normal size. No mass. Adrenals/Urinary Tract: Normal  adrenals. No hydronephrosis. Subcentimeter hypodense renal cortical lesion in the lateral lower left kidney is too small to characterize and is unchanged. No new renal lesions. Normal bladder. Stomach/Bowel: Normal non-distended stomach. Stable postsurgical changes from subtotal right hemicolectomy with intact appearing ileocolic anastomosis in the right abdomen. No dilated or thick-walled small bowel loops. Oral contrast transits to the colon. No wall thickening in the remnant large-bowel or pericolonic fat stranding. Vascular/Lymphatic: Atherosclerotic nonaneurysmal abdominal aorta. Patent portal, splenic, hepatic and renal veins. 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 Patent femoral femoral arterial bypass graft. No pathologically enlarged lymph nodes in the abdomen or pelvis. Reproductive: Status post hysterectomy. Irregular nodular peritoneal thickening superior to the vaginal cuff anterior to the rectum measures 3.0 x 1.3 cm (series 2/image 62), previously 3.0 x 1.3 cm, stable. No adnexal mass. Other: No pneumoperitoneum, ascites or focal fluid collection. Right lower quadrant peritoneal 1.3 x 1.0 cm soft tissue implant (series 2/image 54), decreased from 1.9 x 1.4 cm using similar measurement technique. Musculoskeletal: No aggressive appearing focal osseous lesions. Moderate thoracolumbar spondylosis. IMPRESSION: 1. Right lower quadrant peritoneal implant is decreased. Stable irregular nodular pelvic peritoneal thickening superior to the vaginal cuff. 2. No new or progressive metastatic disease. No abdominopelvic lymphadenopathy. 3.  Aortic Atherosclerosis (ICD10-I70.0). Electronically Signed   By: JIlona SorrelM.D.   On: 11/29/2019 15:31

## 2019-12-23 NOTE — Assessment & Plan Note (Signed)
She has history of pancytopenia Plan upfront dose reduce olaparib So far, she has no side effects

## 2019-12-23 NOTE — Assessment & Plan Note (Signed)
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

## 2019-12-23 NOTE — Assessment & Plan Note (Signed)
Previously, we discussed the risk and benefits of Lynparza She tolerated treatment well so far Her pancytopenia is actually improving She will continue weekly labs x2 and I will see her back in 3 weeks for toxicity review I recommend minimum 3 months of treatment before repeat imaging study, probably due end of April and she agreed

## 2019-12-23 NOTE — Assessment & Plan Note (Signed)
She has history of colon cancer Recent CT imaging show no evidence of disease We will observe closely

## 2019-12-24 LAB — CA 125: Cancer Antigen (CA) 125: 55.9 U/mL — ABNORMAL HIGH (ref 0.0–38.1)

## 2019-12-28 ENCOUNTER — Telehealth: Payer: Self-pay | Admitting: Hematology and Oncology

## 2019-12-28 ENCOUNTER — Telehealth: Payer: Self-pay | Admitting: Oncology

## 2019-12-28 NOTE — Telephone Encounter (Signed)
It's only for labs [place] to move it to [employer]

## 2019-12-28 NOTE — Telephone Encounter (Signed)
Scheduled per 1/29 sch msg. Called and left a msg.

## 2019-12-28 NOTE — Telephone Encounter (Signed)
Patient called and said she can't come to the lab appointment on 12/30/2019.  She is wondering if it can be rescheduled to 2/8 or 2/9.

## 2019-12-28 NOTE — Telephone Encounter (Signed)
Rescheduled lab from 12/30/19 to 01/02/20 at 11:30.  Patient is aware of new appointment date and time.

## 2019-12-30 ENCOUNTER — Other Ambulatory Visit: Payer: Medicare Other

## 2020-01-02 ENCOUNTER — Other Ambulatory Visit: Payer: Self-pay

## 2020-01-02 ENCOUNTER — Telehealth: Payer: Self-pay

## 2020-01-02 ENCOUNTER — Inpatient Hospital Stay: Payer: Medicare Other | Attending: Hematology

## 2020-01-02 DIAGNOSIS — C562 Malignant neoplasm of left ovary: Secondary | ICD-10-CM | POA: Diagnosis not present

## 2020-01-02 LAB — CBC WITH DIFFERENTIAL (CANCER CENTER ONLY)
Abs Immature Granulocytes: 0.01 10*3/uL (ref 0.00–0.07)
Basophils Absolute: 0 10*3/uL (ref 0.0–0.1)
Basophils Relative: 0 %
Eosinophils Absolute: 0 10*3/uL (ref 0.0–0.5)
Eosinophils Relative: 1 %
HCT: 32.7 % — ABNORMAL LOW (ref 36.0–46.0)
Hemoglobin: 11.1 g/dL — ABNORMAL LOW (ref 12.0–15.0)
Immature Granulocytes: 0 %
Lymphocytes Relative: 40 %
Lymphs Abs: 2.3 10*3/uL (ref 0.7–4.0)
MCH: 38.3 pg — ABNORMAL HIGH (ref 26.0–34.0)
MCHC: 33.9 g/dL (ref 30.0–36.0)
MCV: 112.8 fL — ABNORMAL HIGH (ref 80.0–100.0)
Monocytes Absolute: 0.5 10*3/uL (ref 0.1–1.0)
Monocytes Relative: 8 %
Neutro Abs: 2.9 10*3/uL (ref 1.7–7.7)
Neutrophils Relative %: 51 %
Platelet Count: 120 10*3/uL — ABNORMAL LOW (ref 150–400)
RBC: 2.9 MIL/uL — ABNORMAL LOW (ref 3.87–5.11)
RDW: 14.6 % (ref 11.5–15.5)
WBC Count: 5.7 10*3/uL (ref 4.0–10.5)
nRBC: 0 % (ref 0.0–0.2)

## 2020-01-02 LAB — CMP (CANCER CENTER ONLY)
ALT: 7 U/L (ref 0–44)
AST: 12 U/L — ABNORMAL LOW (ref 15–41)
Albumin: 4 g/dL (ref 3.5–5.0)
Alkaline Phosphatase: 62 U/L (ref 38–126)
Anion gap: 8 (ref 5–15)
BUN: 16 mg/dL (ref 6–20)
CO2: 27 mmol/L (ref 22–32)
Calcium: 8.6 mg/dL — ABNORMAL LOW (ref 8.9–10.3)
Chloride: 108 mmol/L (ref 98–111)
Creatinine: 0.97 mg/dL (ref 0.44–1.00)
GFR, Est AFR Am: >60 mL/min (ref >60)
GFR, Estimated: >60 mL/min (ref >60)
Glucose, Bld: 85 mg/dL (ref 70–99)
Potassium: 4.1 mmol/L (ref 3.5–5.1)
Sodium: 143 mmol/L (ref 135–145)
Total Bilirubin: 0.3 mg/dL (ref 0.3–1.2)
Total Protein: 6.8 g/dL (ref 6.5–8.1)

## 2020-01-02 NOTE — Telephone Encounter (Signed)
-----   Message from Heath Lark, MD sent at 01/02/2020 12:42 PM EST ----- Regarding: labs are ok, call her, continue lynparza

## 2020-01-02 NOTE — Telephone Encounter (Signed)
Called and left below message. Ask her to call the office for questions. ?

## 2020-01-03 LAB — CA 125: Cancer Antigen (CA) 125: 58.6 U/mL — ABNORMAL HIGH (ref 0.0–38.1)

## 2020-01-06 ENCOUNTER — Other Ambulatory Visit: Payer: Self-pay

## 2020-01-06 ENCOUNTER — Inpatient Hospital Stay: Payer: Medicare Other

## 2020-01-06 DIAGNOSIS — C562 Malignant neoplasm of left ovary: Secondary | ICD-10-CM | POA: Diagnosis not present

## 2020-01-06 LAB — CBC WITH DIFFERENTIAL (CANCER CENTER ONLY)
Abs Immature Granulocytes: 0 10*3/uL (ref 0.00–0.07)
Basophils Absolute: 0 10*3/uL (ref 0.0–0.1)
Basophils Relative: 1 %
Eosinophils Absolute: 0 10*3/uL (ref 0.0–0.5)
Eosinophils Relative: 1 %
HCT: 34.6 % — ABNORMAL LOW (ref 36.0–46.0)
Hemoglobin: 12.2 g/dL (ref 12.0–15.0)
Immature Granulocytes: 0 %
Lymphocytes Relative: 43 %
Lymphs Abs: 2.5 10*3/uL (ref 0.7–4.0)
MCH: 39.1 pg — ABNORMAL HIGH (ref 26.0–34.0)
MCHC: 35.3 g/dL (ref 30.0–36.0)
MCV: 110.9 fL — ABNORMAL HIGH (ref 80.0–100.0)
Monocytes Absolute: 0.5 10*3/uL (ref 0.1–1.0)
Monocytes Relative: 9 %
Neutro Abs: 2.7 10*3/uL (ref 1.7–7.7)
Neutrophils Relative %: 46 %
Platelet Count: 123 10*3/uL — ABNORMAL LOW (ref 150–400)
RBC: 3.12 MIL/uL — ABNORMAL LOW (ref 3.87–5.11)
RDW: 14.6 % (ref 11.5–15.5)
WBC Count: 5.7 10*3/uL (ref 4.0–10.5)
nRBC: 0 % (ref 0.0–0.2)

## 2020-01-06 LAB — CMP (CANCER CENTER ONLY)
ALT: <6 U/L (ref 0–44)
AST: 12 U/L — ABNORMAL LOW (ref 15–41)
Albumin: 4.2 g/dL (ref 3.5–5.0)
Alkaline Phosphatase: 64 U/L (ref 38–126)
Anion gap: 8 (ref 5–15)
BUN: 10 mg/dL (ref 6–20)
CO2: 28 mmol/L (ref 22–32)
Calcium: 9.2 mg/dL (ref 8.9–10.3)
Chloride: 108 mmol/L (ref 98–111)
Creatinine: 0.88 mg/dL (ref 0.44–1.00)
GFR, Est AFR Am: >60 mL/min (ref >60)
GFR, Estimated: >60 mL/min (ref >60)
Glucose, Bld: 89 mg/dL (ref 70–99)
Potassium: 4.1 mmol/L (ref 3.5–5.1)
Sodium: 144 mmol/L (ref 135–145)
Total Bilirubin: 0.3 mg/dL (ref 0.3–1.2)
Total Protein: 7.1 g/dL (ref 6.5–8.1)

## 2020-01-09 MED FILL — LYNPARZA 100 MG TAB: 100 | 30 days supply | Qty: 120 | Fill #1

## 2020-01-11 ENCOUNTER — Telehealth: Payer: Self-pay | Admitting: Neurology

## 2020-01-11 ENCOUNTER — Other Ambulatory Visit: Payer: Self-pay | Admitting: *Deleted

## 2020-01-11 MED ORDER — PREGABALIN 200 MG PO CAPS: 200.0000 mg | ORAL_CAPSULE | Freq: Three times a day (TID) | ORAL | 5 refills | Status: DC

## 2020-01-11 NOTE — Telephone Encounter (Signed)
The patient has a pending appt on 01/24/2020. She has failed pregabalin in the past and needs PA for brand name Lyrica.  Started on covermymeds (key: [id number]). She has coverage through [employer] [id number]). Pt YE#[id number]. Decision pending.

## 2020-01-11 NOTE — Telephone Encounter (Signed)
PA approved for brand name Lyrica. CN#[id number]. Valid through 11/23/20.

## 2020-01-11 NOTE — Telephone Encounter (Signed)
1) Medication(s) Requested (by name): lyrica 2) Pharmacy of Choice: [facility] in [place]  3) Special Requests:   insurance is requesting a PA for Lyrica

## 2020-01-13 ENCOUNTER — Telehealth: Payer: Self-pay | Admitting: *Deleted

## 2020-01-13 ENCOUNTER — Inpatient Hospital Stay: Payer: Medicare Other

## 2020-01-13 ENCOUNTER — Inpatient Hospital Stay: Payer: Medicare Other | Admitting: Hematology and Oncology

## 2020-01-13 ENCOUNTER — Other Ambulatory Visit: Payer: Self-pay | Admitting: Hematology and Oncology

## 2020-01-13 MED ORDER — HYDROCODONE-ACETAMINOPHEN 5-325 MG PO TABS: 1.0000 | ORAL_TABLET | Freq: Four times a day (QID) | ORAL | 0 refills | Status: DC | PRN

## 2020-01-13 NOTE — Telephone Encounter (Signed)
Patient called to cancel her appointments today. She states her transportation cancelled on her due to the weather. Patient reports she is needing a refill of the Hydrocodone sent to her pharmacy please.   Scheduling message sent to reschedule this appointment to next week.

## 2020-01-13 NOTE — Telephone Encounter (Signed)
I sent refill 

## 2020-01-18 ENCOUNTER — Telehealth: Payer: Self-pay | Admitting: Hematology and Oncology

## 2020-01-18 NOTE — Telephone Encounter (Signed)
Scheduled appt per 2/19 sch message - pt is aware of appt date and time and message sent to MD

## 2020-01-23 ENCOUNTER — Telehealth: Payer: Self-pay

## 2020-01-23 NOTE — Telephone Encounter (Signed)
I called and left the patient a message to call us back to reschedule. I did not see any open pm appts in March. We can use one of the 1pm RN work-in slots for her.  When she calls back, please offer her one of the RN work-in slots.

## 2020-01-23 NOTE — Telephone Encounter (Signed)
Pt left a VM asking [place] to call her and reschedule her appt with Dr. Krista Blue. She is [place] out of the [place]. I called pt. I offered her several appts in April but she wants PM appts and she doesn't want to wait until April. I advised her that I will place her on the waitlist and let Sharyn Lull, RN know of her scheduling request. Pt verbalized understanding.

## 2020-01-24 ENCOUNTER — Ambulatory Visit: Payer: Self-pay | Admitting: Neurology

## 2020-02-03 ENCOUNTER — Encounter: Payer: Medicare Other | Admitting: Nutrition

## 2020-02-03 ENCOUNTER — Inpatient Hospital Stay: Payer: Medicare Other | Attending: Hematology | Admitting: Hematology and Oncology

## 2020-02-03 ENCOUNTER — Encounter: Payer: Self-pay | Admitting: Hematology and Oncology

## 2020-02-03 ENCOUNTER — Inpatient Hospital Stay: Payer: Medicare Other

## 2020-02-03 ENCOUNTER — Telehealth: Payer: Self-pay | Admitting: Hematology and Oncology

## 2020-02-03 ENCOUNTER — Other Ambulatory Visit: Payer: Self-pay

## 2020-02-03 ENCOUNTER — Encounter: Payer: Self-pay | Admitting: Nutrition

## 2020-02-03 DIAGNOSIS — D61818 Other pancytopenia: Secondary | ICD-10-CM | POA: Insufficient documentation

## 2020-02-03 DIAGNOSIS — J439 Emphysema, unspecified: Secondary | ICD-10-CM | POA: Insufficient documentation

## 2020-02-03 DIAGNOSIS — C786 Secondary malignant neoplasm of retroperitoneum and peritoneum: Secondary | ICD-10-CM | POA: Diagnosis not present

## 2020-02-03 DIAGNOSIS — G893 Neoplasm related pain (acute) (chronic): Secondary | ICD-10-CM | POA: Diagnosis not present

## 2020-02-03 DIAGNOSIS — Z85038 Personal history of other malignant neoplasm of large intestine: Secondary | ICD-10-CM | POA: Diagnosis not present

## 2020-02-03 DIAGNOSIS — C562 Malignant neoplasm of left ovary: Secondary | ICD-10-CM

## 2020-02-03 DIAGNOSIS — Z90710 Acquired absence of both cervix and uterus: Secondary | ICD-10-CM | POA: Insufficient documentation

## 2020-02-03 DIAGNOSIS — Z79899 Other long term (current) drug therapy: Secondary | ICD-10-CM | POA: Diagnosis not present

## 2020-02-03 LAB — CBC WITH DIFFERENTIAL (CANCER CENTER ONLY)
Abs Immature Granulocytes: 0.01 10*3/uL (ref 0.00–0.07)
Basophils Absolute: 0 10*3/uL (ref 0.0–0.1)
Basophils Relative: 0 %
Eosinophils Absolute: 0.1 10*3/uL (ref 0.0–0.5)
Eosinophils Relative: 1 %
HCT: 32.8 % — ABNORMAL LOW (ref 36.0–46.0)
Hemoglobin: 11.5 g/dL — ABNORMAL LOW (ref 12.0–15.0)
Immature Granulocytes: 0 %
Lymphocytes Relative: 40 %
Lymphs Abs: 2.2 10*3/uL (ref 0.7–4.0)
MCH: 38.5 pg — ABNORMAL HIGH (ref 26.0–34.0)
MCHC: 35.1 g/dL (ref 30.0–36.0)
MCV: 109.7 fL — ABNORMAL HIGH (ref 80.0–100.0)
Monocytes Absolute: 0.3 10*3/uL (ref 0.1–1.0)
Monocytes Relative: 6 %
Neutro Abs: 3 10*3/uL (ref 1.7–7.7)
Neutrophils Relative %: 53 %
Platelet Count: 98 10*3/uL — ABNORMAL LOW (ref 150–400)
RBC: 2.99 MIL/uL — ABNORMAL LOW (ref 3.87–5.11)
RDW: 14.4 % (ref 11.5–15.5)
WBC Count: 5.6 10*3/uL (ref 4.0–10.5)
nRBC: 0 % (ref 0.0–0.2)

## 2020-02-03 LAB — CMP (CANCER CENTER ONLY)
ALT: <6 U/L (ref 0–44)
AST: 13 U/L — ABNORMAL LOW (ref 15–41)
Albumin: 3.8 g/dL (ref 3.5–5.0)
Alkaline Phosphatase: 64 U/L (ref 38–126)
Anion gap: 8 (ref 5–15)
BUN: 13 mg/dL (ref 6–20)
CO2: 28 mmol/L (ref 22–32)
Calcium: 8.8 mg/dL — ABNORMAL LOW (ref 8.9–10.3)
Chloride: 107 mmol/L (ref 98–111)
Creatinine: 0.86 mg/dL (ref 0.44–1.00)
GFR, Est AFR Am: >60 mL/min (ref >60)
GFR, Estimated: >60 mL/min (ref >60)
Glucose, Bld: 93 mg/dL (ref 70–99)
Potassium: 4.3 mmol/L (ref 3.5–5.1)
Sodium: 143 mmol/L (ref 135–145)
Total Bilirubin: 0.4 mg/dL (ref 0.3–1.2)
Total Protein: 6.5 g/dL (ref 6.5–8.1)

## 2020-02-03 MED ORDER — HYDROCODONE-ACETAMINOPHEN 5-325 MG PO TABS: 1.0000 | ORAL_TABLET | Freq: Four times a day (QID) | ORAL | 0 refills | Status: DC | PRN

## 2020-02-03 MED ORDER — ONDANSETRON HCL 8 MG PO TABS: 8.0000 mg | ORAL_TABLET | Freq: Three times a day (TID) | ORAL | 1 refills | Status: DC | PRN

## 2020-02-03 NOTE — Assessment & Plan Note (Signed)
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

## 2020-02-03 NOTE — Progress Notes (Signed)
Provided ensure enlive samples for patient at her request.

## 2020-02-03 NOTE — Assessment & Plan Note (Signed)
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

## 2020-02-03 NOTE — Assessment & Plan Note (Signed)
She tolerated olaparib well except for some nausea and pancytopenia I recommend holding treatment by a week due to progressive thrombocytopenia She has antiemetics to take as needed We will continue close monitoring of her blood counts over the next few weeks while she is on treatment I will see her again next month for further follow-up

## 2020-02-03 NOTE — Telephone Encounter (Signed)
Scheduled appt per 3/12 sch message - unable to reach pt . Left message with appt dates and times

## 2020-02-03 NOTE — Progress Notes (Signed)
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 [facility]) She tolerated olaparib well except for some nausea and pancytopenia I recommend holding treatment by a week due to progressive thrombocytopenia She has antiemetics to take as needed We will continue close monitoring of her blood counts over the next few weeks while she is on treatment I will see her again next month for further follow-up  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   All questions were answered. The patient knows to call the clinic with any problems, questions or concerns. The total time spent in the appointment was 20 minutes encounter with patients including review of chart and various tests results, discussions about plan of care and coordination of care plan   Heath Lark, MD 02/03/2020 4:57 PM  INTERVAL HISTORY: Please see below for  problem oriented charting. She returns for chemotherapy follow-up while on olaparib She has some mild occasional nausea Her peripheral neuropathy and chronic pain is stable No recent falls The patient denies any recent signs or symptoms of bleeding such as spontaneous epistaxis, hematuria or hematochezia. Denies recent infection, fever or chills No constipation  SUMMARY OF ONCOLOGIC HISTORY: Oncology History Overview Note  BRCA1 positive   Cancer of right colon ([place])  09/16/2014 Imaging   CT abd/pel:  a 3.8cm cecal mass with associated ileocecal intussusception, and a 9.1cm solid and cystic mass in the left pelvis,   09/18/2014 Surgery   right hemicolectomy and terminal ileoectomy   09/18/2014 Pathologic Stage   pT3pN1Mx, tumor extends into pericolonic soft tissue and is less than 34m from the serosal surface, LVI 8-), PNI (-), all 23 node negative, one soft tissue tumor deposit, surgical margins negative.    09/18/2014 Initial Diagnosis   Adenocarcinoma of right colon   08/06/2016 Imaging   CT chest/abd/pelvis with contrast IMPRESSION: 1. No recurrent malignancy identified. 2. 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 On the prior exam from 09/13/2015 there was back flow of contrast in the left external iliac artery to supply the left internal iliac artery ; this back flow of contrast is no longer present in the left external iliac artery is occluded. 3. Several pulmonary nodules at or below 4 mm diameter, unchanged. 4. 3 mm hypodensity posteriorly in the body of the pancreas, not well seen previously and subtle today, likely incidental, but merits observation. 5. 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 Prominent degenerative spurring of both acetabula.  6. Lumbar spondylosis and degenerative disc disease causing prominent impingement at L4-5 and mild impingement at L5-S1.    03/08/2017 Imaging   CT CAP W Contrast 03/08/17 IMPRESSION: Chest Impression: 1. Stable small pulmonary nodules within LEFT lung. 2. No mediastinal lymphadenopathy. Abdomen / Pelvis Impression: 1. No evidence of metastatic colorectal cancer or ovarian cancer within the abdomen pelvis. 2. No lymphadenopathy. 3. Postsurgical change consistent RIGHT hemicolectomy and hysterectomy 4. A fem-fem arterial bypass noted.   11/13/2017 Imaging   CT AP w contrast IMPRESSION: 1. No findings of recurrent malignancy in the abdomen or pelvis. 2. Other imaging findings of potential clinical significance: Chronic AVN of both femoral heads without contour abnormality. Lumbar impingement at L4-5 and L5-S1. Aortic Atherosclerosis (ICD10-I70.0). 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 Partial right hemicolectomy. Hysterectomy.   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 This was discovered during her workup for colon cancer.   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 Tumor size 3.7 cm, no lymph nodes removed. T1cNx. Tumor cells are positive for cytokeratin 7 and estrogen receptor.   11/01/2014 Surgery   B/l Salpingo-oophorectomy.   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 Stable small pulmonary nodules within LEFT lung. 2. No mediastinal lymphadenopathy. Abdomen / Pelvis Impression: 1. No evidence of metastatic colorectal cancer or ovarian  cancer within the abdomen pelvis. 2. No lymphadenopathy. 3. Postsurgical change consistent RIGHT hemicolectomy and hysterectomy 4. A fem-fem arterial bypass noted.   03/12/2017 Mammogram   MM Right Breast 03/12/17 IMPRESSION: No mammographic or sonographic evidence of malignancy. No correlate identified for the abnormal enhancement seen within the right nipple on MRI of 02/27/2017.    04/02/2017 Pathology Results   Surgical Pathology of Nipple Biopsy: 04/02/17 Diagnosis Nipple Biopsy, Right - BENIGN NIPPLE TISSUE. - NO ATYPIA OR MALIGNANCY.    11/13/2017 Imaging   1. No findings of recurrent malignancy in the abdomen or pelvis. 2. Other imaging findings of potential clinical significance: Chronic AVN of both femoral heads without contour abnormality. Lumbar impingement at L4-5 and L5-S1. Aortic Atherosclerosis (ICD10-I70.0). 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 Partial right hemicolectomy. Hysterectomy.   03/22/2018 Mammogram   Screening mammogram IMPRESSION No evidence of malignancy.     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   06/06/2019 Imaging   1. Tiny bilateral pulmonary nodules stable since 11/12/2018. Continued close attention on follow-up recommended. 2. Incompletely visualized upper abdominal lymphadenopathy better characterized on the abdomen CT from 2 weeks ago. 3.  Aortic Atherosclerois (ICD10-170.0) 4.  Emphysema. ([id number])   06/07/2019 Procedure   Technically successful CT guided core needle biopsy of indeterminate peritoneal nodule within the right lower abdomen/pelvis.  06/07/2019 Pathology Results   Soft Tissue Needle Core Biopsy, peritoneal nodule within  right lower abdomen - METASTATIC CARCINOMA CONSISTENT WITH PATIENT'S CLINICAL HISTORY OF PRIMARY OVARIAN CARCINOMA. SEE NOTE Diagnosis Note Immunohistochemical stains show that the tumor cells are positive for CK7, ER and PAX 8; and negative for CK20 and CDX2. This immunostaining profile is consistent with above diagnosis.   06/16/2019 Tumor Marker   Patient's tumor was tested for the following markers: CA-125 Results of the tumor marker test revealed 499   06/20/2019 - 11/14/2019 Chemotherapy   The patient had carboplatin and taxol   07/11/2019 Tumor Marker   Patient's tumor was tested for the following markers: CA-125 Results of the tumor marker test revealed 438   08/05/2019 Tumor Marker   Patient's tumor was tested for the following markers: CA-125 Results of the tumor marker test revealed 272.   08/25/2019 Imaging   Ct chest, abdomen and pelvis 1. Slight interval decrease in size of a right lower quadrant peritoneal nodule measuring 1.7 x 1.6 cm, previously 2.2 x 2.2 cm (series 2, image 94, series 5, image 58). Findings are consistent with treatment response.   2. No significant change in ill-defined soft tissue about the sigmoid colon in the hysterectomy bed (series 2, image 101).   3.  Status post hysterectomy.   4. No evidence of metastatic disease in the chest. Stable benign small pulmonary nodules.   5.  Coronary artery disease.  Aortic Atherosclerosis (ICD10-I70.0).     08/26/2019 Tumor Marker   Patient's tumor was tested for the following markers: CA-125 Results of the tumor marker test revealed 189.   09/26/2019 Tumor Marker   Patient's tumor was tested for the following markers: CA-125 Results of the tumor marker test revealed 137   10/03/2019 Tumor Marker   Patient's tumor was tested for the following markers: CA-125 Results of the tumor marker test revealed 137.   10/31/2019 Tumor Marker   Patient's tumor was tested for the following markers: CA-125 Results of  the tumor marker test revealed 101   11/29/2019 Imaging   1. Right lower quadrant peritoneal implant is decreased. Stable irregular nodular pelvic peritoneal thickening superior to the vaginal cuff. 2. No new or progressive metastatic disease. No abdominopelvic lymphadenopathy. 3.  Aortic Atherosclerosis (ICD10-I70.0).   12/23/2019 Tumor Marker   Patient's tumor was tested for the following markers: CA-125 Results of the tumor marker test revealed 55.9   01/02/2020 Tumor Marker   Patient's tumor was tested for the following markers: CA-125. Results of the tumor marker test revealed 58.6     REVIEW OF SYSTEMS:   Constitutional: Denies fevers, chills or abnormal weight loss Eyes: Denies blurriness of vision Ears, nose, mouth, throat, and face: Denies mucositis or sore throat Respiratory: Denies cough, dyspnea or wheezes Cardiovascular: Denies palpitation, chest discomfort or lower extremity swelling Skin: Denies abnormal skin rashes Lymphatics: Denies new lymphadenopathy or easy bruising Behavioral/Psych: Mood is stable, no new changes  All other systems were reviewed with the patient and are negative.  I have reviewed the past medical history, past surgical history, social history and family history with the patient and they are unchanged from previous note.  ALLERGIES:  is allergic to other; demerol [meperidine]; morphine and related; oxycodone; penicillins; strawberry flavor; tylenol [acetaminophen]; and ibuprofen.  MEDICATIONS:  Current Outpatient Medications  Medication Sig Dispense Refill  . acetaminophen (TYLENOL) 325 MG tablet Take 325 mg by mouth as needed.    . docusate sodium 100 MG CAPS Take 100  mg by mouth 2 (two) times daily as needed for mild constipation. 10 capsule 0  . HYDROcodone-acetaminophen (NORCO/VICODIN) 5-325 MG tablet Take 1 tablet by mouth every 6 (six) hours as needed for moderate pain. 60 tablet 0  . lidocaine (LIDODERM) 5 % PLACE 1 PATCH ONTO THE SKIN  EVERY DAY.REMOVE AND DISCARD PATCH WITHIN 12 HOURS OR AS DIRECTED BY PRESCRIBER 30 patch 1  . LORazepam (ATIVAN) 0.5 MG tablet Take 1 tablet (0.5 mg total) by mouth 3 (three) times daily as needed for anxiety. 10 tablet 0  . losartan (COZAAR) 50 MG tablet Take 1 tablet (50 mg total) by mouth daily. (Patient not taking: Reported on 11/14/2019) 90 tablet 2  . olaparib (LYNPARZA) 100 MG tablet Take 2 tablets (200 mg total) by mouth 2 (two) times daily. Swallow whole. May take with food to decrease nausea and vomiting. 120 tablet 11  . ondansetron (ZOFRAN) 8 MG tablet Take 1 tablet (8 mg total) by mouth every 8 (eight) hours as needed for nausea or vomiting. 60 tablet 1  . pregabalin (LYRICA) 200 MG capsule Take 1 capsule (200 mg total) by mouth every 8 (eight) hours. Please give patient brand name Lyrica 90 capsule 5  . prochlorperazine (COMPAZINE) 10 MG tablet Take 1 tablet (10 mg total) by mouth every 6 (six) hours as needed (Nausea or vomiting). 90 tablet 1   No current facility-administered medications for this visit.    PHYSICAL EXAMINATION: ECOG PERFORMANCE STATUS: 1 - Symptomatic but completely ambulatory  Vitals:   02/03/20 1112  BP: (!) 141/76  Pulse: 72  Resp: 18  Temp: 98.3 F (36.8 C)  SpO2: 100%   Filed Weights   02/03/20 1112  Weight: 164 lb 6.4 oz (74.6 kg)    GENERAL:alert, no distress and comfortable NEURO: alert & oriented x 3 with fluent speech, no focal motor/sensory deficits  LABORATORY DATA:  I have reviewed the data as listed    Component Value Date/Time   NA 143 02/03/2020 1054   NA 141 10/07/2017 1436   K 4.3 02/03/2020 1054   K 4.4 10/07/2017 1436   CL 107 02/03/2020 1054   CO2 28 02/03/2020 1054   CO2 26 10/07/2017 1436   GLUCOSE 93 02/03/2020 1054   GLUCOSE 85 10/07/2017 1436   BUN 13 02/03/2020 1054   BUN 14.9 10/07/2017 1436   CREATININE 0.86 02/03/2020 1054   CREATININE 1.0 10/07/2017 1436   CALCIUM 8.8 (L) 02/03/2020 1054   CALCIUM 9.5  10/07/2017 1436   PROT 6.5 02/03/2020 1054   PROT 7.8 10/07/2017 1436   ALBUMIN 3.8 02/03/2020 1054   ALBUMIN 3.9 10/07/2017 1436   AST 13 (L) 02/03/2020 1054   AST 15 10/07/2017 1436   ALT <6 02/03/2020 1054   ALT 7 10/07/2017 1436   ALKPHOS 64 02/03/2020 1054   ALKPHOS 81 10/07/2017 1436   BILITOT 0.4 02/03/2020 1054   BILITOT 0.54 10/07/2017 1436   GFRNONAA >60 02/03/2020 1054   GFRAA >60 02/03/2020 1054    No results found for: SPEP, UPEP  Lab Results  Component Value Date   WBC 5.6 02/03/2020   NEUTROABS 3.0 02/03/2020   HGB 11.5 (L) 02/03/2020   HCT 32.8 (L) 02/03/2020   MCV 109.7 (H) 02/03/2020   PLT 98 (L) 02/03/2020      Chemistry      Component Value Date/Time   NA 143 02/03/2020 1054   NA 141 10/07/2017 1436   K 4.3 02/03/2020 1054   K 4.4  10/07/2017 1436   CL 107 02/03/2020 1054   CO2 28 02/03/2020 1054   CO2 26 10/07/2017 1436   BUN 13 02/03/2020 1054   BUN 14.9 10/07/2017 1436   CREATININE 0.86 02/03/2020 1054   CREATININE 1.0 10/07/2017 1436      Component Value Date/Time   CALCIUM 8.8 (L) 02/03/2020 1054   CALCIUM 9.5 10/07/2017 1436   ALKPHOS 64 02/03/2020 1054   ALKPHOS 81 10/07/2017 1436   AST 13 (L) 02/03/2020 1054   AST 15 10/07/2017 1436   ALT <6 02/03/2020 1054   ALT 7 10/07/2017 1436   BILITOT 0.4 02/03/2020 1054   BILITOT 0.54 10/07/2017 1436

## 2020-02-04 LAB — CA 125: Cancer Antigen (CA) 125: 52.2 U/mL — ABNORMAL HIGH (ref 0.0–38.1)

## 2020-02-06 ENCOUNTER — Other Ambulatory Visit: Payer: Self-pay | Admitting: Hematology and Oncology

## 2020-02-06 DIAGNOSIS — C562 Malignant neoplasm of left ovary: Secondary | ICD-10-CM

## 2020-02-08 ENCOUNTER — Telehealth: Payer: Self-pay

## 2020-02-08 NOTE — Telephone Encounter (Signed)
Pt called stating her pain "in the groin area" was so bad she had to take one of her olaparib pills because her pain pills were not helping.  Pt states her "whole body swelled up" but that is now resolved. Pt states she has doubled up on the pain pills - taking two instead of one. This RN confirmed lab appt for Friday 3/19 and informed pt above information would be forwarded to Dr Alvy Bimler.

## 2020-02-09 NOTE — Telephone Encounter (Signed)
I sent scheduling msg to add her to my schedule on Friday after labs I do not recommend she double up her pain medication; if she runs out sooner her insurance will not pay for the medication She can add 400 mg ibuprofen along with her pain medications 3 times a day to see if that would help

## 2020-02-09 NOTE — Telephone Encounter (Signed)
Called and given below message. She verbalized understanding. Given appt times. She is feeling better today. She cannot take the ibuprofen. She will take Tylenol if needed.

## 2020-02-10 ENCOUNTER — Encounter: Payer: Self-pay | Admitting: Hematology and Oncology

## 2020-02-10 ENCOUNTER — Inpatient Hospital Stay: Payer: Medicare Other

## 2020-02-10 ENCOUNTER — Inpatient Hospital Stay (HOSPITAL_BASED_OUTPATIENT_CLINIC_OR_DEPARTMENT_OTHER): Payer: Medicare Other | Admitting: Hematology and Oncology

## 2020-02-10 ENCOUNTER — Other Ambulatory Visit: Payer: Self-pay

## 2020-02-10 DIAGNOSIS — G893 Neoplasm related pain (acute) (chronic): Secondary | ICD-10-CM | POA: Diagnosis not present

## 2020-02-10 DIAGNOSIS — C182 Malignant neoplasm of ascending colon: Secondary | ICD-10-CM | POA: Diagnosis not present

## 2020-02-10 DIAGNOSIS — D61818 Other pancytopenia: Secondary | ICD-10-CM

## 2020-02-10 DIAGNOSIS — C562 Malignant neoplasm of left ovary: Secondary | ICD-10-CM | POA: Diagnosis not present

## 2020-02-10 DIAGNOSIS — J439 Emphysema, unspecified: Secondary | ICD-10-CM | POA: Diagnosis not present

## 2020-02-10 DIAGNOSIS — C786 Secondary malignant neoplasm of retroperitoneum and peritoneum: Secondary | ICD-10-CM | POA: Diagnosis not present

## 2020-02-10 DIAGNOSIS — G609 Hereditary and idiopathic neuropathy, unspecified: Secondary | ICD-10-CM | POA: Diagnosis not present

## 2020-02-10 DIAGNOSIS — Z85038 Personal history of other malignant neoplasm of large intestine: Secondary | ICD-10-CM | POA: Diagnosis not present

## 2020-02-10 LAB — CBC WITH DIFFERENTIAL (CANCER CENTER ONLY)
Abs Immature Granulocytes: 0.01 10*3/uL (ref 0.00–0.07)
Basophils Absolute: 0 10*3/uL (ref 0.0–0.1)
Basophils Relative: 1 %
Eosinophils Absolute: 0.1 10*3/uL (ref 0.0–0.5)
Eosinophils Relative: 1 %
HCT: 37.4 % (ref 36.0–46.0)
Hemoglobin: 13.2 g/dL (ref 12.0–15.0)
Immature Granulocytes: 0 %
Lymphocytes Relative: 47 %
Lymphs Abs: 2.8 10*3/uL (ref 0.7–4.0)
MCH: 38.2 pg — ABNORMAL HIGH (ref 26.0–34.0)
MCHC: 35.3 g/dL (ref 30.0–36.0)
MCV: 108.1 fL — ABNORMAL HIGH (ref 80.0–100.0)
Monocytes Absolute: 0.5 10*3/uL (ref 0.1–1.0)
Monocytes Relative: 8 %
Neutro Abs: 2.5 10*3/uL (ref 1.7–7.7)
Neutrophils Relative %: 43 %
Platelet Count: 114 10*3/uL — ABNORMAL LOW (ref 150–400)
RBC: 3.46 MIL/uL — ABNORMAL LOW (ref 3.87–5.11)
RDW: 14.6 % (ref 11.5–15.5)
WBC Count: 5.8 10*3/uL (ref 4.0–10.5)
nRBC: 0 % (ref 0.0–0.2)

## 2020-02-10 LAB — CMP (CANCER CENTER ONLY)
ALT: 6 U/L (ref 0–44)
AST: 14 U/L — ABNORMAL LOW (ref 15–41)
Albumin: 4.3 g/dL (ref 3.5–5.0)
Alkaline Phosphatase: 73 U/L (ref 38–126)
Anion gap: 9 (ref 5–15)
BUN: 11 mg/dL (ref 6–20)
CO2: 25 mmol/L (ref 22–32)
Calcium: 9.5 mg/dL (ref 8.9–10.3)
Chloride: 107 mmol/L (ref 98–111)
Creatinine: 0.84 mg/dL (ref 0.44–1.00)
GFR, Est AFR Am: >60 mL/min (ref >60)
GFR, Estimated: >60 mL/min (ref >60)
Glucose, Bld: 96 mg/dL (ref 70–99)
Potassium: 4.5 mmol/L (ref 3.5–5.1)
Sodium: 141 mmol/L (ref 135–145)
Total Bilirubin: 0.4 mg/dL (ref 0.3–1.2)
Total Protein: 7.4 g/dL (ref 6.5–8.1)

## 2020-02-10 NOTE — Assessment & Plan Note (Signed)
She has numerous CT scan which show no evidence of recurrence of colon cancer We will observe closely

## 2020-02-10 NOTE — Progress Notes (Signed)
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 [facility]) Her blood counts are stable since we held the treatment She will resume at previous dose I think it is reasonable to resume at previous dose and recheck again in 2 weeks If she developed severe pancytopenia again, I will reduce her olaparib 150 mg twice a day   Cancer of right colon [facility]) She has numerous CT scan which show no evidence of recurrence of colon cancer We will observe closely  Pancytopenia, acquired [facility]) She has recent pancytopenia but has since recovered She is not symptomatic Observe for now  Neuropathy, idiopathic She had recent flare of peripheral neuropathy of unknown etiology I discouraged the patient from taking her pain medication more than she was prescribed Her pain is better now She will continue taking Lyrica and her prescribed pain medicine   No orders of the defined types were placed in this encounter.   All questions were answered. The patient knows to call the clinic with any problems, questions or concerns. The total time spent in the appointment was 20 minutes encounter with patients including review of chart and various tests results, discussions about plan of care and coordination of care plan   Heath Lark, MD 02/10/2020 2:08 PM  INTERVAL HISTORY: Please see below for problem oriented charting. She returns for further follow-up She recently have exacerbation of her pain of unknown  etiology Her neuropathic pain suddenly got worse and she took more pain medicine than prescribed Since she restarted olaparib, her pain is less She denies nausea No recent diarrhea or constipation No recent infection, fever or chills  SUMMARY OF ONCOLOGIC HISTORY: Oncology History Overview Note  BRCA1 positive   Cancer of right colon ([place])  09/16/2014 Imaging   CT abd/pel:  a 3.8cm cecal mass with associated ileocecal intussusception, and a 9.1cm solid and cystic mass in the left pelvis,   09/18/2014 Surgery   right hemicolectomy and terminal ileoectomy   09/18/2014 Pathologic Stage   pT3pN1Mx, tumor extends into pericolonic soft tissue and is less than 83m from the serosal surface, LVI 8-), PNI (-), all 23 node negative, one soft tissue tumor deposit, surgical margins negative.    09/18/2014 Initial Diagnosis   Adenocarcinoma of right colon   08/06/2016 Imaging   CT chest/abd/pelvis with contrast IMPRESSION: 1. No recurrent malignancy identified. 2. 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 On the prior exam from 09/13/2015 there was back flow of contrast in the left external iliac artery to supply the left internal iliac artery ; this back flow of contrast is no longer present in the left external iliac artery is occluded. 3. Several pulmonary nodules at or below 4 mm diameter, unchanged. 4. 3 mm hypodensity posteriorly in the body of the pancreas, not well seen previously and subtle today, likely incidental, but merits observation. 5. 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 Prominent degenerative spurring of both acetabula. 6. Lumbar spondylosis and degenerative disc disease causing prominent impingement at L4-5 and mild impingement at  L5-S1.   03/08/2017 Imaging   CT CAP W Contrast 03/08/17 IMPRESSION: Chest Impression: 1. Stable small pulmonary nodules within LEFT lung. 2. No  mediastinal lymphadenopathy. Abdomen / Pelvis Impression: 1. No evidence of metastatic colorectal cancer or ovarian cancer within the abdomen pelvis. 2. No lymphadenopathy. 3. Postsurgical change consistent RIGHT hemicolectomy and hysterectomy 4. A fem-fem arterial bypass noted.   11/13/2017 Imaging   CT AP w contrast IMPRESSION: 1. No findings of recurrent malignancy in the abdomen or pelvis. 2. Other imaging findings of potential clinical significance: Chronic AVN of both femoral heads without contour abnormality. Lumbar impingement at L4-5 and L5-S1. Aortic Atherosclerosis (ICD10-I70.0). 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 Partial right hemicolectomy. Hysterectomy.   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 This was discovered during her workup for colon cancer.   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 Tumor size 3.7 cm, no lymph nodes removed. T1cNx. Tumor cells are positive for cytokeratin 7 and estrogen receptor.   11/01/2014 Surgery   B/l Salpingo-oophorectomy.   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 Stable small pulmonary nodules within LEFT lung. 2. No mediastinal lymphadenopathy. Abdomen / Pelvis Impression: 1. No evidence of metastatic colorectal cancer or ovarian cancer within the abdomen pelvis. 2. No lymphadenopathy. 3. Postsurgical change consistent RIGHT hemicolectomy and hysterectomy 4. A  fem-fem arterial bypass noted.   03/12/2017 Mammogram   MM Right Breast 03/12/17 IMPRESSION: No mammographic or sonographic evidence of malignancy. No correlate identified for the abnormal enhancement seen within the right nipple on MRI of 02/27/2017.    04/02/2017 Pathology Results   Surgical Pathology of Nipple Biopsy: 04/02/17 Diagnosis Nipple Biopsy, Right - BENIGN NIPPLE TISSUE. - NO ATYPIA OR MALIGNANCY.    11/13/2017 Imaging   1. No findings of recurrent malignancy in the abdomen or pelvis. 2. Other imaging findings of potential clinical significance: Chronic AVN of both femoral heads without contour abnormality. Lumbar impingement at L4-5 and L5-S1. Aortic Atherosclerosis (ICD10-I70.0). 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 Partial right hemicolectomy. Hysterectomy.   03/22/2018 Mammogram   Screening mammogram IMPRESSION No evidence of malignancy.     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   06/06/2019 Imaging   1. Tiny bilateral pulmonary nodules stable since 11/12/2018. Continued close attention on follow-up recommended. 2. Incompletely visualized upper abdominal lymphadenopathy better characterized on the abdomen CT from 2 weeks ago. 3.  Aortic Atherosclerois (ICD10-170.0) 4.  Emphysema. ([id number])   06/07/2019 Procedure   Technically successful CT guided core needle biopsy of indeterminate peritoneal nodule within the right lower abdomen/pelvis.   06/07/2019 Pathology Results   Soft Tissue Needle Core Biopsy, peritoneal nodule within right lower  abdomen - METASTATIC CARCINOMA CONSISTENT WITH PATIENT'S CLINICAL HISTORY OF PRIMARY OVARIAN CARCINOMA. SEE NOTE Diagnosis  Note Immunohistochemical stains show that the tumor cells are positive for CK7, ER and PAX 8; and negative for CK20 and CDX2. This immunostaining profile is consistent with above diagnosis.   06/16/2019 Tumor Marker   Patient's tumor was tested for the following markers: CA-125 Results of the tumor marker test revealed 499   06/20/2019 - 11/14/2019 Chemotherapy   The patient had carboplatin and taxol   07/11/2019 Tumor Marker   Patient's tumor was tested for the following markers: CA-125 Results of the tumor marker test revealed 438   08/05/2019 Tumor Marker   Patient's tumor was tested for the following markers: CA-125 Results of the tumor marker test revealed 272.   08/25/2019 Imaging   Ct chest, abdomen and pelvis 1. Slight interval decrease in size of a right lower quadrant peritoneal nodule measuring 1.7 x 1.6 cm, previously 2.2 x 2.2 cm (series 2, image 94, series 5, image 58). Findings are consistent with treatment response.   2. No significant change in ill-defined soft tissue about the sigmoid colon in the hysterectomy bed (series 2, image 101).   3.  Status post hysterectomy.   4. No evidence of metastatic disease in the chest. Stable benign small pulmonary nodules.   5.  Coronary artery disease.  Aortic Atherosclerosis (ICD10-I70.0).     08/26/2019 Tumor Marker   Patient's tumor was tested for the following markers: CA-125 Results of the tumor marker test revealed 189.   09/26/2019 Tumor Marker   Patient's tumor was tested for the following markers: CA-125 Results of the tumor marker test revealed 137   10/03/2019 Tumor Marker   Patient's tumor was tested for the following markers: CA-125 Results of the tumor marker test revealed 137.   10/31/2019 Tumor Marker   Patient's tumor was tested for the following markers: CA-125 Results of the tumor marker test revealed 101   11/29/2019 Imaging   1. Right lower quadrant peritoneal implant is decreased. Stable irregular  nodular pelvic peritoneal thickening superior to the vaginal cuff. 2. No new or progressive metastatic disease. No abdominopelvic lymphadenopathy. 3.  Aortic Atherosclerosis (ICD10-I70.0).   12/23/2019 Tumor Marker   Patient's tumor was tested for the following markers: CA-125 Results of the tumor marker test revealed 55.9   01/02/2020 Tumor Marker   Patient's tumor was tested for the following markers: CA-125. Results of the tumor marker test revealed 58.6   02/03/2020 Tumor Marker   Patient's tumor was tested for the following markers: CA-125 Results of the tumor marker test revealed 52.2     REVIEW OF SYSTEMS:   Constitutional: Denies fevers, chills or abnormal weight loss Eyes: Denies blurriness of vision Ears, nose, mouth, throat, and face: Denies mucositis or sore throat Respiratory: Denies cough, dyspnea or wheezes Cardiovascular: Denies palpitation, chest discomfort or lower extremity swelling Gastrointestinal:  Denies nausea, heartburn or change in bowel habits Skin: Denies abnormal skin rashes Lymphatics: Denies new lymphadenopathy or easy bruising Neurological:Denies numbness, tingling or new weaknesses Behavioral/Psych: Mood is stable, no new changes  All other systems were reviewed with the patient and are negative.  I have reviewed the past medical history, past surgical history, social history and family history with the patient and they are unchanged from previous note.  ALLERGIES:  is allergic to other; demerol [meperidine]; morphine and related; oxycodone; penicillins; strawberry flavor; tylenol [acetaminophen]; and ibuprofen.  MEDICATIONS:  Current Outpatient Medications  Medication Sig Dispense Refill  .  acetaminophen (TYLENOL) 325 MG tablet Take 325 mg by mouth as needed.    . docusate sodium 100 MG CAPS Take 100 mg by mouth 2 (two) times daily as needed for mild constipation. 10 capsule 0  . HYDROcodone-acetaminophen (NORCO/VICODIN) 5-325 MG tablet Take 1  tablet by mouth every 6 (six) hours as needed for moderate pain. 60 tablet 0  . lidocaine (LIDODERM) 5 % PLACE 1 PATCH ONTO THE SKIN EVERY DAY.REMOVE AND DISCARD PATCH WITHIN 12 HOURS OR AS DIRECTED BY PRESCRIBER 30 patch 1  . LORazepam (ATIVAN) 0.5 MG tablet Take 1 tablet (0.5 mg total) by mouth 3 (three) times daily as needed for anxiety. 10 tablet 0  . losartan (COZAAR) 50 MG tablet Take 1 tablet (50 mg total) by mouth daily. (Patient not taking: Reported on 11/14/2019) 90 tablet 2  . olaparib (LYNPARZA) 100 MG tablet Take 2 tablets (200 mg total) by mouth 2 (two) times daily. Swallow whole. May take with food to decrease nausea and vomiting. 120 tablet 11  . ondansetron (ZOFRAN) 8 MG tablet Take 1 tablet (8 mg total) by mouth every 8 (eight) hours as needed for nausea or vomiting. 60 tablet 1  . pregabalin (LYRICA) 200 MG capsule Take 1 capsule (200 mg total) by mouth every 8 (eight) hours. Please give patient brand name Lyrica 90 capsule 5  . prochlorperazine (COMPAZINE) 10 MG tablet TAKE 1 TABLET(10 MG) BY MOUTH EVERY 6 HOURS AS NEEDED FOR NAUSEA OR VOMITING 90 tablet 1   No current facility-administered medications for this visit.    PHYSICAL EXAMINATION: ECOG PERFORMANCE STATUS: 1 - Symptomatic but completely ambulatory  Vitals:   02/10/20 1119  BP: (!) 174/96  Pulse: 87  Resp: 18  Temp: 98.5 F (36.9 C)  SpO2: 100%   Filed Weights   02/10/20 1119  Weight: 159 lb (72.1 kg)    GENERAL:alert, no distress and comfortable SKIN: skin color, texture, turgor are normal, no rashes or significant lesions EYES: normal, Conjunctiva are pink and non-injected, sclera clear OROPHARYNX:no exudate, no erythema and lips, buccal mucosa, and tongue normal  NECK: supple, thyroid normal size, non-tender, without nodularity LYMPH:  no palpable lymphadenopathy in the cervical, axillary or inguinal LUNGS: clear to auscultation and percussion with normal breathing effort HEART: regular rate &  rhythm and no murmurs and no lower extremity edema ABDOMEN:abdomen soft, non-tender and normal bowel sounds Musculoskeletal:no cyanosis of digits and no clubbing  NEURO: alert & oriented x 3 with fluent speech, no focal motor/sensory deficits  LABORATORY DATA:  I have reviewed the data as listed    Component Value Date/Time   NA 141 02/10/2020 1101   NA 141 10/07/2017 1436   K 4.5 02/10/2020 1101   K 4.4 10/07/2017 1436   CL 107 02/10/2020 1101   CO2 25 02/10/2020 1101   CO2 26 10/07/2017 1436   GLUCOSE 96 02/10/2020 1101   GLUCOSE 85 10/07/2017 1436   BUN 11 02/10/2020 1101   BUN 14.9 10/07/2017 1436   CREATININE 0.84 02/10/2020 1101   CREATININE 1.0 10/07/2017 1436   CALCIUM 9.5 02/10/2020 1101   CALCIUM 9.5 10/07/2017 1436   PROT 7.4 02/10/2020 1101   PROT 7.8 10/07/2017 1436   ALBUMIN 4.3 02/10/2020 1101   ALBUMIN 3.9 10/07/2017 1436   AST 14 (L) 02/10/2020 1101   AST 15 10/07/2017 1436   ALT 6 02/10/2020 1101   ALT 7 10/07/2017 1436   ALKPHOS 73 02/10/2020 1101   ALKPHOS 81 10/07/2017 1436  BILITOT 0.4 02/10/2020 1101   BILITOT 0.54 10/07/2017 1436   GFRNONAA >60 02/10/2020 1101   GFRAA >60 02/10/2020 1101    No results found for: SPEP, UPEP  Lab Results  Component Value Date   WBC 5.8 02/10/2020   NEUTROABS 2.5 02/10/2020   HGB 13.2 02/10/2020   HCT 37.4 02/10/2020   MCV 108.1 (H) 02/10/2020   PLT 114 (L) 02/10/2020      Chemistry      Component Value Date/Time   NA 141 02/10/2020 1101   NA 141 10/07/2017 1436   K 4.5 02/10/2020 1101   K 4.4 10/07/2017 1436   CL 107 02/10/2020 1101   CO2 25 02/10/2020 1101   CO2 26 10/07/2017 1436   BUN 11 02/10/2020 1101   BUN 14.9 10/07/2017 1436   CREATININE 0.84 02/10/2020 1101   CREATININE 1.0 10/07/2017 1436      Component Value Date/Time   CALCIUM 9.5 02/10/2020 1101   CALCIUM 9.5 10/07/2017 1436   ALKPHOS 73 02/10/2020 1101   ALKPHOS 81 10/07/2017 1436   AST 14 (L) 02/10/2020 1101   AST 15  10/07/2017 1436   ALT 6 02/10/2020 1101   ALT 7 10/07/2017 1436   BILITOT 0.4 02/10/2020 1101   BILITOT 0.54 10/07/2017 1436

## 2020-02-10 NOTE — Assessment & Plan Note (Signed)
She had recent flare of peripheral neuropathy of unknown etiology I discouraged the patient from taking her pain medication more than she was prescribed Her pain is better now She will continue taking Lyrica and her prescribed pain medicine

## 2020-02-10 NOTE — Assessment & Plan Note (Signed)
She has recent pancytopenia but has since recovered She is not symptomatic Observe for now

## 2020-02-10 NOTE — Assessment & Plan Note (Signed)
Her blood counts are stable since we held the treatment She will resume at previous dose I think it is reasonable to resume at previous dose and recheck again in 2 weeks If she developed severe pancytopenia again, I will reduce her olaparib 150 mg twice a day

## 2020-02-13 ENCOUNTER — Telehealth: Payer: Self-pay

## 2020-02-13 NOTE — Telephone Encounter (Signed)
She called and left a message. Asking for a call back regarding a medication.  Called back. Mail box is full. Unable to leave a message.

## 2020-02-13 NOTE — Telephone Encounter (Signed)
She called to clarify how to take [place] Told her to continue taking 200 mg 2 x a day. Dr. Alvy Bimler will see her on 4/2 and reevaluate. She verbalized understanding.  Is it okay to take the covid vaccine? She thinks that she may get appt next week.

## 2020-02-13 NOTE — Telephone Encounter (Signed)
Voicemail left at 2:23p:  Pt calling to schedule an appt

## 2020-02-13 NOTE — Telephone Encounter (Signed)
Pt has been scheduled.  °

## 2020-02-14 NOTE — Telephone Encounter (Signed)
Called and given below message. She verbalized understanding. 

## 2020-02-14 NOTE — Telephone Encounter (Signed)
Yes ok to take vaccine

## 2020-02-17 ENCOUNTER — Other Ambulatory Visit: Payer: Medicare Other

## 2020-02-20 MED FILL — LYNPARZA 100 MG TAB: 100 | 30 days supply | Qty: 120 | Fill #2

## 2020-02-24 ENCOUNTER — Inpatient Hospital Stay: Payer: Medicare Other | Attending: Hematology | Admitting: Hematology and Oncology

## 2020-02-24 ENCOUNTER — Inpatient Hospital Stay: Payer: Medicare Other

## 2020-02-24 ENCOUNTER — Encounter: Payer: Self-pay | Admitting: Hematology and Oncology

## 2020-02-24 ENCOUNTER — Telehealth: Payer: Self-pay | Admitting: Hematology and Oncology

## 2020-02-24 ENCOUNTER — Other Ambulatory Visit: Payer: Self-pay

## 2020-02-24 DIAGNOSIS — C562 Malignant neoplasm of left ovary: Secondary | ICD-10-CM

## 2020-02-24 DIAGNOSIS — G893 Neoplasm related pain (acute) (chronic): Secondary | ICD-10-CM | POA: Insufficient documentation

## 2020-02-24 DIAGNOSIS — D61818 Other pancytopenia: Secondary | ICD-10-CM

## 2020-02-24 LAB — CMP (CANCER CENTER ONLY)
ALT: 8 U/L (ref 0–44)
AST: 19 U/L (ref 15–41)
Albumin: 4.3 g/dL (ref 3.5–5.0)
Alkaline Phosphatase: 72 U/L (ref 38–126)
Anion gap: 10 (ref 5–15)
BUN: 17 mg/dL (ref 6–20)
CO2: 26 mmol/L (ref 22–32)
Calcium: 9.9 mg/dL (ref 8.9–10.3)
Chloride: 107 mmol/L (ref 98–111)
Creatinine: 0.97 mg/dL (ref 0.44–1.00)
GFR, Est AFR Am: >60 mL/min (ref >60)
GFR, Estimated: >60 mL/min (ref >60)
Glucose, Bld: 100 mg/dL — ABNORMAL HIGH (ref 70–99)
Potassium: 4.2 mmol/L (ref 3.5–5.1)
Sodium: 143 mmol/L (ref 135–145)
Total Bilirubin: 0.5 mg/dL (ref 0.3–1.2)
Total Protein: 7.5 g/dL (ref 6.5–8.1)

## 2020-02-24 LAB — CBC WITH DIFFERENTIAL (CANCER CENTER ONLY)
Abs Immature Granulocytes: 0.01 10*3/uL (ref 0.00–0.07)
Basophils Absolute: 0 10*3/uL (ref 0.0–0.1)
Basophils Relative: 1 %
Eosinophils Absolute: 0.1 10*3/uL (ref 0.0–0.5)
Eosinophils Relative: 1 %
HCT: 36.3 % (ref 36.0–46.0)
Hemoglobin: 12.9 g/dL (ref 12.0–15.0)
Immature Granulocytes: 0 %
Lymphocytes Relative: 46 %
Lymphs Abs: 3 10*3/uL (ref 0.7–4.0)
MCH: 38.5 pg — ABNORMAL HIGH (ref 26.0–34.0)
MCHC: 35.5 g/dL (ref 30.0–36.0)
MCV: 108.4 fL — ABNORMAL HIGH (ref 80.0–100.0)
Monocytes Absolute: 0.5 10*3/uL (ref 0.1–1.0)
Monocytes Relative: 8 %
Neutro Abs: 2.8 10*3/uL (ref 1.7–7.7)
Neutrophils Relative %: 44 %
Platelet Count: 113 10*3/uL — ABNORMAL LOW (ref 150–400)
RBC: 3.35 MIL/uL — ABNORMAL LOW (ref 3.87–5.11)
RDW: 14.2 % (ref 11.5–15.5)
WBC Count: 6.4 10*3/uL (ref 4.0–10.5)
nRBC: 0 % (ref 0.0–0.2)

## 2020-02-24 MED ORDER — HYDROCODONE-ACETAMINOPHEN 5-325 MG PO TABS: 1.0000 | ORAL_TABLET | Freq: Four times a day (QID) | ORAL | 0 refills | Status: DC | PRN

## 2020-02-24 NOTE — Assessment & Plan Note (Signed)
Her blood counts are stable since we restarted treatment She will continue current dose without dose adjustment I recommend we recheck her labs again at the end of the month If she is able to tolerate her current dose, we will continue indefinitely I plan to repeat CT imaging next month for further follow-up

## 2020-02-24 NOTE — Assessment & Plan Note (Signed)
Her pancytopenia is stable/improved We will continue current treatment without dose adjustment She is not symptomatic

## 2020-02-24 NOTE — Progress Notes (Signed)
[place] OFFICE PROGRESS NOTE  Patient Care Team: Marrian Salvage, [place] as PCP - General (Internal Medicine) Truitt Merle, MD as Consulting Physician (Hematology) Judeth Horn, MD as Consulting Physician (General Surgery) Nat Math, MD as Referring Physician (Obstetrics and Gynecology) Elam Dutch, MD as Consulting Physician (Vascular Surgery) [name], MD as Attending Physician (Gynecologic Oncology) Heath Lark, MD as Consulting Physician (Hematology and Oncology)  ASSESSMENT & PLAN:  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 [facility]) Her blood counts are stable since we restarted treatment She will continue current dose without dose adjustment I recommend we recheck her labs again at the end of the month If she is able to tolerate her current dose, we will continue indefinitely I plan to repeat CT imaging next month for further follow-up  Pancytopenia, acquired [facility]) Her pancytopenia is stable/improved We will continue current treatment without dose adjustment She is not symptomatic  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   All questions were answered. The patient knows to call the clinic with any problems, questions or concerns. The total time spent in the appointment was 20 minutes encounter with patients including review of chart and various tests results, discussions about plan of care and coordination of care plan   Heath Lark, MD 02/24/2020 12:32 PM  INTERVAL HISTORY: Please see below for problem oriented charting. She returns for treatment and follow-up She is doing very well She denies side effects from treatment so far except for very rare occasional nausea Bowel habits are normal No recent infection, fever or chills Her chronic  peripheral neuropathy and chronic pain is stable  SUMMARY OF ONCOLOGIC HISTORY: Oncology History Overview Note  BRCA1 positive   Cancer of right colon ([place])  09/16/2014 Imaging   CT abd/pel:  a 3.8cm cecal mass with associated ileocecal intussusception, and a 9.1cm solid and cystic mass in the left pelvis,   09/18/2014 Surgery   right hemicolectomy and terminal ileoectomy   09/18/2014 Pathologic Stage   pT3pN1Mx, tumor extends into pericolonic soft tissue and is less than 71m from the serosal surface, LVI 8-), PNI (-), all 23 node negative, one soft tissue tumor deposit, surgical margins negative.    09/18/2014 Initial Diagnosis   Adenocarcinoma of right colon   08/06/2016 Imaging   CT chest/abd/pelvis with contrast IMPRESSION: 1. No recurrent malignancy identified. 2. 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 On the prior exam from 09/13/2015 there was back flow of contrast in the left external iliac artery to supply the left internal iliac artery ; this back flow of contrast is no longer present in the left external iliac artery is occluded. 3. Several pulmonary nodules at or below 4 mm diameter, unchanged. 4. 3 mm hypodensity posteriorly in the body of the pancreas, not well seen previously and subtle today, likely incidental, but merits observation. 5. 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 Prominent degenerative spurring of both acetabula. 6. Lumbar spondylosis and degenerative disc disease causing prominent impingement at L4-5 and mild impingement at L5-S1.   03/08/2017 Imaging   CT CAP W Contrast 03/08/17 IMPRESSION: Chest Impression: 1. Stable small pulmonary nodules within LEFT lung. 2. No mediastinal lymphadenopathy. Abdomen / Pelvis Impression: 1. No evidence of metastatic colorectal cancer or  ovarian cancer within the abdomen pelvis. 2. No lymphadenopathy. 3.  Postsurgical change consistent RIGHT hemicolectomy and hysterectomy 4. A fem-fem arterial bypass noted.   11/13/2017 Imaging   CT AP w contrast IMPRESSION: 1. No findings of recurrent malignancy in the abdomen or pelvis. 2. Other imaging findings of potential clinical significance: Chronic AVN of both femoral heads without contour abnormality. Lumbar impingement at L4-5 and L5-S1. Aortic Atherosclerosis (ICD10-I70.0). 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 Partial right hemicolectomy. Hysterectomy.   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 This was discovered during her workup for colon cancer.   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 Tumor size 3.7 cm, no lymph nodes removed. T1cNx. Tumor cells are positive for cytokeratin 7 and estrogen receptor.   11/01/2014 Surgery   B/l Salpingo-oophorectomy.   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 Stable small pulmonary nodules within LEFT lung. 2. No mediastinal lymphadenopathy. Abdomen / Pelvis Impression: 1. No evidence of metastatic colorectal cancer or ovarian cancer within the abdomen pelvis. 2. No lymphadenopathy. 3. Postsurgical change consistent RIGHT hemicolectomy and hysterectomy 4. A fem-fem arterial bypass noted.   03/12/2017 Mammogram   MM Right Breast 03/12/17 IMPRESSION: No mammographic or sonographic evidence of malignancy. No correlate identified for the  abnormal enhancement seen within the right nipple on MRI of 02/27/2017.    04/02/2017 Pathology Results   Surgical Pathology of Nipple Biopsy: 04/02/17 Diagnosis Nipple Biopsy, Right - BENIGN NIPPLE TISSUE. - NO ATYPIA OR MALIGNANCY.    11/13/2017 Imaging   1. No findings of recurrent malignancy in the abdomen or pelvis. 2. Other imaging findings of potential clinical significance: Chronic AVN of both femoral heads without contour abnormality. Lumbar impingement at L4-5 and L5-S1. Aortic Atherosclerosis (ICD10-I70.0). 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 Partial right hemicolectomy. Hysterectomy.   03/22/2018 Mammogram   Screening mammogram IMPRESSION No evidence of malignancy.     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   06/06/2019 Imaging   1. Tiny bilateral pulmonary nodules stable since 11/12/2018. Continued close attention on follow-up recommended. 2. Incompletely visualized upper abdominal lymphadenopathy better characterized on the abdomen CT from 2 weeks ago. 3.  Aortic Atherosclerois (ICD10-170.0) 4.  Emphysema. (ALP37-T02.9)   06/07/2019 Procedure   Technically successful CT guided core needle biopsy of indeterminate peritoneal nodule within the right lower abdomen/pelvis.   06/07/2019 Pathology Results   Soft Tissue Needle Core Biopsy, peritoneal nodule within right lower abdomen - METASTATIC CARCINOMA CONSISTENT WITH PATIENT'S CLINICAL HISTORY OF PRIMARY OVARIAN CARCINOMA. SEE NOTE Diagnosis Note Immunohistochemical stains show that the tumor cells are positive for CK7, ER and PAX 8; and negative for CK20 and CDX2. This  immunostaining profile is consistent with above  diagnosis.   06/16/2019 Tumor Marker   Patient's tumor was tested for the following markers: CA-125 Results of the tumor marker test revealed 499   06/20/2019 - 11/14/2019 Chemotherapy   The patient had carboplatin and taxol   07/11/2019 Tumor Marker   Patient's tumor was tested for the following markers: CA-125 Results of the tumor marker test revealed 438   08/05/2019 Tumor Marker   Patient's tumor was tested for the following markers: CA-125 Results of the tumor marker test revealed 272.   08/25/2019 Imaging   Ct chest, abdomen and pelvis 1. Slight interval decrease in size of a right lower quadrant peritoneal nodule measuring 1.7 x 1.6 cm, previously 2.2 x 2.2 cm (series 2, image 94, series 5, image 58). Findings are consistent with treatment response.   2. No significant change in ill-defined soft tissue about the sigmoid colon in the hysterectomy bed (series 2, image 101).   3.  Status post hysterectomy.   4. No evidence of metastatic disease in the chest. Stable benign small pulmonary nodules.   5.  Coronary artery disease.  Aortic Atherosclerosis (ICD10-I70.0).     08/26/2019 Tumor Marker   Patient's tumor was tested for the following markers: CA-125 Results of the tumor marker test revealed 189.   09/26/2019 Tumor Marker   Patient's tumor was tested for the following markers: CA-125 Results of the tumor marker test revealed 137   10/03/2019 Tumor Marker   Patient's tumor was tested for the following markers: CA-125 Results of the tumor marker test revealed 137.   10/31/2019 Tumor Marker   Patient's tumor was tested for the following markers: CA-125 Results of the tumor marker test revealed 101   11/29/2019 Imaging   1. Right lower quadrant peritoneal implant is decreased. Stable irregular nodular pelvic peritoneal thickening superior to the vaginal cuff. 2. No new or progressive metastatic disease. No abdominopelvic lymphadenopathy. 3.  Aortic Atherosclerosis  (ICD10-I70.0).   12/23/2019 Tumor Marker   Patient's tumor was tested for the following markers: CA-125 Results of the tumor marker test revealed 55.9   01/02/2020 Tumor Marker   Patient's tumor was tested for the following markers: CA-125. Results of the tumor marker test revealed 58.6   02/03/2020 Tumor Marker   Patient's tumor was tested for the following markers: CA-125 Results of the tumor marker test revealed 52.2     REVIEW OF SYSTEMS:   Constitutional: Denies fevers, chills or abnormal weight loss Eyes: Denies blurriness of vision Ears, nose, mouth, throat, and face: Denies mucositis or sore throat Respiratory: Denies cough, dyspnea or wheezes Cardiovascular: Denies palpitation, chest discomfort or lower extremity swelling Gastrointestinal:  Denies nausea, heartburn or change in bowel habits Skin: Denies abnormal skin rashes Lymphatics: Denies new lymphadenopathy or easy bruising Neurological:Denies numbness, tingling or new weaknesses Behavioral/Psych: Mood is stable, no new changes  All other systems were reviewed with the patient and are negative.  I have reviewed the past medical history, past surgical history, social history and family history with the patient and they are unchanged from previous note.  ALLERGIES:  is allergic to other; demerol [meperidine]; morphine and related; oxycodone; penicillins; strawberry flavor; tylenol [acetaminophen]; and ibuprofen.  MEDICATIONS:  Current Outpatient Medications  Medication Sig Dispense Refill  . acetaminophen (TYLENOL) 325 MG tablet Take 325 mg by mouth as needed.    . docusate sodium 100 MG CAPS Take 100 mg by mouth 2 (two) times daily as needed for mild constipation. 10 capsule  0  . HYDROcodone-acetaminophen (NORCO/VICODIN) 5-325 MG tablet Take 1 tablet by mouth every 6 (six) hours as needed for moderate pain. 60 tablet 0  . lidocaine (LIDODERM) 5 % PLACE 1 PATCH ONTO THE SKIN EVERY DAY.REMOVE AND DISCARD PATCH WITHIN 12  HOURS OR AS DIRECTED BY PRESCRIBER 30 patch 1  . LORazepam (ATIVAN) 0.5 MG tablet Take 1 tablet (0.5 mg total) by mouth 3 (three) times daily as needed for anxiety. 10 tablet 0  . losartan (COZAAR) 50 MG tablet Take 1 tablet (50 mg total) by mouth daily. (Patient not taking: Reported on 11/14/2019) 90 tablet 2  . olaparib (LYNPARZA) 100 MG tablet Take 2 tablets (200 mg total) by mouth 2 (two) times daily. Swallow whole. May take with food to decrease nausea and vomiting. 120 tablet 11  . ondansetron (ZOFRAN) 8 MG tablet Take 1 tablet (8 mg total) by mouth every 8 (eight) hours as needed for nausea or vomiting. 60 tablet 1  . pregabalin (LYRICA) 200 MG capsule Take 1 capsule (200 mg total) by mouth every 8 (eight) hours. Please give patient brand name Lyrica 90 capsule 5  . prochlorperazine (COMPAZINE) 10 MG tablet TAKE 1 TABLET(10 MG) BY MOUTH EVERY 6 HOURS AS NEEDED FOR NAUSEA OR VOMITING 90 tablet 1   No current facility-administered medications for this visit.    PHYSICAL EXAMINATION: ECOG PERFORMANCE STATUS: 1 - Symptomatic but completely ambulatory  Vitals:   02/24/20 1125  BP: 114/70  Pulse: 69  Resp: 20  Temp: 98.2 F (36.8 C)  SpO2: 100%   Filed Weights   02/24/20 1125  Weight: 160 lb 6.4 oz (72.8 kg)    GENERAL:alert, no distress and comfortable SKIN: skin color, texture, turgor are normal, no rashes or significant lesions EYES: normal, Conjunctiva are pink and non-injected, sclera clear OROPHARYNX:no exudate, no erythema and lips, buccal mucosa, and tongue normal  NECK: supple, thyroid normal size, non-tender, without nodularity LYMPH:  no palpable lymphadenopathy in the cervical, axillary or inguinal LUNGS: clear to auscultation and percussion with normal breathing effort HEART: regular rate & rhythm and no murmurs and no lower extremity edema ABDOMEN:abdomen soft, non-tender and normal bowel sounds Musculoskeletal:no cyanosis of digits and no clubbing  NEURO: alert &  oriented x 3 with fluent speech, no focal motor/sensory deficits  LABORATORY DATA:  I have reviewed the data as listed    Component Value Date/Time   NA 143 02/24/2020 1059   NA 141 10/07/2017 1436   K 4.2 02/24/2020 1059   K 4.4 10/07/2017 1436   CL 107 02/24/2020 1059   CO2 26 02/24/2020 1059   CO2 26 10/07/2017 1436   GLUCOSE 100 (H) 02/24/2020 1059   GLUCOSE 85 10/07/2017 1436   BUN 17 02/24/2020 1059   BUN 14.9 10/07/2017 1436   CREATININE 0.97 02/24/2020 1059   CREATININE 1.0 10/07/2017 1436   CALCIUM 9.9 02/24/2020 1059   CALCIUM 9.5 10/07/2017 1436   PROT 7.5 02/24/2020 1059   PROT 7.8 10/07/2017 1436   ALBUMIN 4.3 02/24/2020 1059   ALBUMIN 3.9 10/07/2017 1436   AST 19 02/24/2020 1059   AST 15 10/07/2017 1436   ALT 8 02/24/2020 1059   ALT 7 10/07/2017 1436   ALKPHOS 72 02/24/2020 1059   ALKPHOS 81 10/07/2017 1436   BILITOT 0.5 02/24/2020 1059   BILITOT 0.54 10/07/2017 1436   GFRNONAA >60 02/24/2020 1059   GFRAA >60 02/24/2020 1059    No results found for: SPEP, UPEP  Lab Results  Component  Value Date   WBC 6.4 02/24/2020   NEUTROABS 2.8 02/24/2020   HGB 12.9 02/24/2020   HCT 36.3 02/24/2020   MCV 108.4 (H) 02/24/2020   PLT 113 (L) 02/24/2020      Chemistry      Component Value Date/Time   NA 143 02/24/2020 1059   NA 141 10/07/2017 1436   K 4.2 02/24/2020 1059   K 4.4 10/07/2017 1436   CL 107 02/24/2020 1059   CO2 26 02/24/2020 1059   CO2 26 10/07/2017 1436   BUN 17 02/24/2020 1059   BUN 14.9 10/07/2017 1436   CREATININE 0.97 02/24/2020 1059   CREATININE 1.0 10/07/2017 1436      Component Value Date/Time   CALCIUM 9.9 02/24/2020 1059   CALCIUM 9.5 10/07/2017 1436   ALKPHOS 72 02/24/2020 1059   ALKPHOS 81 10/07/2017 1436   AST 19 02/24/2020 1059   AST 15 10/07/2017 1436   ALT 8 02/24/2020 1059   ALT 7 10/07/2017 1436   BILITOT 0.5 02/24/2020 1059   BILITOT 0.54 10/07/2017 1436

## 2020-02-24 NOTE — Telephone Encounter (Signed)
Scheduled appt per 4/2 sch msg - pt aware of appt date and time

## 2020-02-24 NOTE — Assessment & Plan Note (Signed)
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

## 2020-02-25 LAB — CA 125: Cancer Antigen (CA) 125: 64.8 U/mL — ABNORMAL HIGH (ref 0.0–38.1)

## 2020-03-14 ENCOUNTER — Encounter: Payer: Medicare Other | Admitting: Family

## 2020-03-15 ENCOUNTER — Telehealth: Payer: Self-pay

## 2020-03-15 ENCOUNTER — Other Ambulatory Visit: Payer: Self-pay | Admitting: Hematology and Oncology

## 2020-03-15 MED ORDER — HYDROCODONE-ACETAMINOPHEN 5-325 MG PO TABS: 1.0000 | ORAL_TABLET | Freq: Four times a day (QID) | ORAL | 0 refills | Status: DC | PRN

## 2020-03-15 NOTE — Telephone Encounter (Signed)
done

## 2020-03-15 NOTE — Telephone Encounter (Signed)
She called and requested a refill on Hydrocodone Rx.

## 2020-03-15 NOTE — Telephone Encounter (Signed)
Called and given below message. She verbalized understanding. 

## 2020-03-19 MED FILL — LYNPARZA 100 MG TAB: 100 | 30 days supply | Qty: 120 | Fill #3

## 2020-03-20 ENCOUNTER — Other Ambulatory Visit: Payer: Self-pay

## 2020-03-20 ENCOUNTER — Encounter: Payer: Self-pay | Admitting: Neurology

## 2020-03-20 ENCOUNTER — Ambulatory Visit (INDEPENDENT_AMBULATORY_CARE_PROVIDER_SITE_OTHER): Payer: Medicare Other | Admitting: Neurology

## 2020-03-20 VITALS — BP 128/81 | HR 69 | Temp 97.1°F | Ht 67.0 in | Wt 166.0 lb

## 2020-03-20 DIAGNOSIS — M79605 Pain in left leg: Secondary | ICD-10-CM | POA: Diagnosis not present

## 2020-03-20 DIAGNOSIS — R200 Anesthesia of skin: Secondary | ICD-10-CM

## 2020-03-20 MED ORDER — PREGABALIN 200 MG PO CAPS: 200.0000 mg | ORAL_CAPSULE | Freq: Three times a day (TID) | ORAL | 5 refills | Status: DC

## 2020-03-20 NOTE — Progress Notes (Signed)
PATIENT: Catherine Rogers DOB: 09/13/1961  Chief Complaint  Patient presents with  . Numbness/Pain    She is still having pain and numbness in her left leg. She is taking Lyrica 200mg , one capsule BID which is helpful. She finished up chemotherapy in January 2021 for ovarian cancer.      HISTORICAL  Catherine Rogers is a 59 year old female, seen in request by her primary care nurse practitioner Catherine Rogers for evaluation of left leg pain, difficulty walking, initial evaluation was on June 08, 2019.  I have reviewed and summarized the referring note from the referring physician.  She had past medical history of hypertension, I saw her previously in April 2016, she was diagnosed with right-sided colon cancer following GI bleeding in October 2015, also found to have ovarian cancer, she underwent exploratory laparotomy with excision of pelvic mass, and left ovarian in December 2015, third surgery in December 26, 2014 for exploratory lap prostatectomy with right oophorectomy, pelvic and periaortic lymph node dissection, omentumectomy, she complains of left lower extremity radiating pain since the second surgery in February 2016, previously had extensive evaluation, negative for left lower extremity DVT, MRI of pelvic without contrast in March 2016 from [facility] showed 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, 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, she also had electrodiagnostic study in April 2016, which suggestive of left L4, L5-S1 lumbar plexopathy, CT of lumbar spine showed moderate to severe lower lumbar facet arthrosis, worst at L4-5, with evidence of moderate spinal canal stenosis, moderate bilateral neural foraminal stenosis  The conclusion at that time for her radiating left lower extremity pain was likely the combination of left hip pathology, a component of left lumbar plexopathy, her pain failed to be controlled by Lyrica, Lortab, I treated her with fentanyl patch, for a while,  she lost to follow-up  Patient reported that her left leg pain overall was under reasonable control until June 2020, she complains of increased lower abdomen, left lower extremity pain, repeat CT abdomen on May 24, 2015 showed new peritoneal carcinomatosis in the pelvic, and right lower quadrant, she just had a CT-guided biopsy of peritoneal nodule within the right lower abdomen pelvic  She now complains of significant left lower extremity pain from the hip down, difficulty bearing weight, low back pain, right leg is her stronger leg, she denies bowel and bladder incontinence.  UPDATE March 20 2020:  She continue complains of bilateral lower extremity pain, she has been receiving hemotherapy for her left ovarian cancer, continue complains of bilateral lower extremity, hip pain,   Reviewed MRI of pelvic with without contrast in March 2016, avascular necrosis of left hip with approximately 40 to 50% involvement of the articular surface, no evidence of collapse, mild facet signal abnormality involving the right femoral head neck junction anteriorly and posteriorly, which could represent early right hip avascular necrosis versus degenerative changes, degenerative changes of the visualized lower lumbar region  EMG nerve conduction study in August 2020 suggest 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  She ambulate without assistant,  REVIEW OF SYSTEMS: Full 14 system review of systems performed and notable only for as above All other review of systems were negative.  ALLERGIES: Allergies  Allergen Reactions  . Other Hives and Itching    Highly acidic foods "make me itch and break out" oranges, tomatoes  .  Demerol [Meperidine] Nausea And Vomiting  . Morphine And Related Nausea And Vomiting  . Oxycodone Itching  . Penicillins  Other (See Comments)    Childhood allergy  . Strawberry Flavor Itching    Catherine Rogers reports to [facility] RN CM she is allergic to Strawberries and anything strawberry flavored   . Tylenol [Acetaminophen] Nausea And Vomiting  . Ibuprofen Nausea And Vomiting    HOME MEDICATIONS: Current Outpatient Medications  Medication Sig Dispense Refill  . acetaminophen (TYLENOL) 325 MG tablet Take 325 mg by mouth as needed.    . docusate sodium 100 MG CAPS Take 100 mg by mouth 2 (two) times daily as needed for mild constipation. 10 capsule 0  . HYDROcodone-acetaminophen (NORCO/VICODIN) 5-325 MG tablet Take 1 tablet by mouth every 6 (six) hours as needed for moderate pain. 60 tablet 0  . lidocaine (LIDODERM) 5 % PLACE 1 PATCH ONTO THE SKIN EVERY DAY.REMOVE AND DISCARD PATCH WITHIN 12 HOURS OR AS DIRECTED BY PRESCRIBER 30 patch 1  . LORazepam (ATIVAN) 0.5 MG tablet Take 1 tablet (0.5 mg total) by mouth 3 (three) times daily as needed for anxiety. 10 tablet 0  . losartan (COZAAR) 50 MG tablet Take 1 tablet (50 mg total) by mouth daily. 90 tablet 2  . olaparib (LYNPARZA) 100 MG tablet Take 2 tablets (200 mg total) by mouth 2 (two) times daily. Swallow whole. May take with food to decrease nausea and vomiting. 120 tablet 11  . ondansetron (ZOFRAN) 8 MG tablet Take 1 tablet (8 mg total) by mouth every 8 (eight) hours as needed for nausea or vomiting. 60 tablet 1  . pregabalin (LYRICA) 200 MG capsule Take 1 capsule (200 mg total) by mouth every 8 (eight) hours. Please give patient brand name Lyrica 90 capsule 5  . prochlorperazine (COMPAZINE) 10 MG tablet TAKE 1 TABLET(10 MG) BY MOUTH EVERY 6 HOURS AS NEEDED FOR NAUSEA OR VOMITING 90 tablet 1   No current facility-administered medications for this visit.    PAST MEDICAL HISTORY: Past Medical History:  Diagnosis Date  . Anemia   . Anxiety   . Arthritis   . Asthma   . Cancer ([place]) dx'd 08/2014   colon and ovarian  . Complication of anesthesia    woke and saw  long tube coming out of mouth . sat up then went back to sleep  . Depression   . Ectopic pregnancy   . Family history of adverse reaction to anesthesia    sister on dialysis- hypotension   . Family history of ovarian cancer   . Head injury, closed, with brief LOC ([place])    had a cut- as a child  . Headache   . Hepatitis    being treated for Hep C  . History of blood transfusion    2005 approximately   . Hypertension    no meds now  . PAD (peripheral artery disease) ([place]) 04/18/2015  . Pelvic cyst   . PONV (postoperative nausea and vomiting)   . PTSD (post-traumatic stress disorder)     PAST SURGICAL HISTORY: Past Surgical History:  Procedure Laterality Date  . ABDOMINAL HYSTERECTOMY    . BREAST BIOPSY Right 04/02/2017   Procedure: INCISIONAL BIOPSY RIGHT NIPPLE;  Surgeon: Coralie Keens, MD;  Location: [place];  Service: General;  Laterality: Right;  . COLONOSCOPY    . ESOPHAGOGASTRODUODENOSCOPY N/A 09/17/2014   Procedure: ESOPHAGOGASTRODUODENOSCOPY (EGD);  Surgeon: Jeryl Columbia, MD;  [facility];  Service: Endoscopy;  Laterality:  N/A;  . FEMORAL-FEMORAL BYPASS GRAFT Bilateral 04/18/2015   Procedure:  RIGHT COMMON FEMORAL TO LEFT COMMON  FEMORAL  ARTERY BYPASS WITH  8 MM HEMASHIELD GRAFT,RIGHT COMMON SUPERFICIAL FEMORAL ARTERY ENDARTERECTOMY;  Surgeon: Elam Dutch, MD;  Location: [place];  Service: Vascular;  Laterality: Bilateral;  . ILEOSTOMY Right 09/18/2014   Procedure: ILEOCOLOSTOMY;  Surgeon: Doreen Salvage, MD;  Location: [place];  Service: General;  Laterality: Right;  . LAPAROTOMY N/A 11/01/2014   Procedure: EXPLORATORY LAPAROTOMY;  Surgeon: Doreen Salvage, MD;  Location: [place];  Service: General;  Laterality: N/A;  . LAPAROTOMY N/A 12/26/2014   Procedure:  LAPAROTOMY ;  Surgeon: Everitt Amber, MD;  Location: [facility];  Service: Gynecology;  Laterality: N/A;  . MASS EXCISION  11/01/2014   Procedure: EXCISION PELVIC MASS;  Surgeon: Doreen Salvage, MD;  Location: [place];  Service: General;;  . OOPHORECTOMY  2016  . PARTIAL COLECTOMY Right 09/18/2014   Procedure: RIGHT HEMI COLECTOMY;  Surgeon: Doreen Salvage, MD;  Location: [place];  Service: General;  Laterality: Right;  . ROBOTIC ASSISTED TOTAL HYSTERECTOMY WITH BILATERAL SALPINGO OOPHERECTOMY Right 12/26/2014   Procedure: EXPLORATORY LAPAROSCOPY  RIGHT SALPINGO OOPHORECTOMY OMENTECTOMY LYMPHADECTOMY ;  Surgeon: Everitt Amber, MD;  Location: [facility];  Service: Gynecology;  Laterality: Right;  . TUBAL LIGATION      FAMILY HISTORY: Family History  Problem Relation Age of Onset  . Brain cancer Sister 67  . Lung cancer Brother 34  . Heart attack Mother   . Heart attack Father        72s  . Heart disease Father   . Heart attack Maternal Uncle   . Diabetes Maternal Uncle   . Breast cancer Maternal Uncle   . Ovarian cancer Paternal Aunt        3 paternal aunts with ovarian cancer  . Heart attack Brother   . Diabetes Paternal Uncle     SOCIAL HISTORY: Social History   Socioeconomic History  . Marital status: Married    Spouse name: Juanda Crumble  . Number of children: 6  . Years of education: 65  . Highest education level: High school graduate  Occupational History  . Occupation: Disability  Tobacco Use  . Smoking status: Current Some Day Smoker    Years: 10.00    Types: Cigars  . Smokeless tobacco: Never Used  . Tobacco comment: pack per month of cigars = 5  Substance and Sexual Activity  . Alcohol use: No  . Drug use: Not Currently    Frequency: 5.0 times per week    Types: Marijuana    Comment: last smoked 09/22/17  . Sexual activity: Never  Other Topics Concern  . Not on file  Social History Narrative   Born and raised in [place], [place]. Currently resides in a house by herself. No pets. Fun: Read, shoot pool, poetry, Journal, knitting   Denies religious beliefs that would effect health care.    Lives at home with her son.   Right-handed.   No caffeine use.   Denies abuse and feels safe at  home.    Social Determinants of Health   Financial Resource Strain:   . Difficulty of Paying Living Expenses:   Food Insecurity:   . Worried About Charity fundraiser in the Last Year:   . Arboriculturist in the Last Year:   Transportation Needs:   . Film/video editor (Medical):   [name] Lack of Transportation (Non-Medical):   Physical  Activity:   . Days of Exercise per Week:   . Minutes of Exercise per Session:   Stress:   . Feeling of Stress :   Social Connections:   . Frequency of Communication with Friends and Family:   . Frequency of Social Gatherings with Friends and Family:   . Attends Religious Services:   . Active Member of Clubs or Organizations:   . Attends Archivist Meetings:   [name] Marital Status:   Intimate Partner Violence: Not At Risk  . Fear of Current or Ex-Partner: No  . Emotionally Abused: No  . Physically Abused: No  . Sexually Abused: No     PHYSICAL EXAM   Vitals:   03/20/20 1132  BP: 128/81  Pulse: 69  Temp: (!) 97.1 F (36.2 C)  Weight: 166 lb (75.3 kg)  Height: 5\' 7"  (1.702 m)    Not recorded      Body mass index is 26 kg/m.  PHYSICAL EXAMNIATION:  Gen: NAD, conversant, well nourised, obese, well groomed                     Cardiovascular: Regular rate rhythm, no peripheral edema, warm, nontender. Eyes: Conjunctivae clear without exudates or hemorrhage Neck: Supple, no carotid bruits. Pulmonary: Clear to auscultation bilaterally   NEUROLOGICAL EXAM:  MENTAL STATUS: Speech/cognition: Awake alert oriented to history taking and casual conversation   CRANIAL NERVES: CN II: Visual fields are full to confrontation. Pupils are round equal and briskly reactive to light. CN III, IV, VI: extraocular movement are normal. No ptosis. CN V: Facial sensation is intact to pinprick in all 3 divisions bilaterally. Corneal responses are intact.  CN VII: Face is symmetric with normal eye closure and smile. CN VIII: Hearing is normal  to rubbing fingers CN IX, X: Palate elevates symmetrically. Phonation is normal. CN XI: Head turning and shoulder shrug are intact CN XII: Tongue is midline with normal movements and no atrophy.  MOTOR: Left lower extremity motor strength examination is limited because left hip and lower extremity pain, there was no significant weakness  REFLEXES: Reflexes are 2+ and symmetric at the biceps, triceps, knees, and ankles. Plantar responses are flexor.  SENSORY: Intact to light touch, pinprick, positional sensation and vibratory sensation are intact in fingers and toes.  COORDINATION: Rapid alternating movements and fine finger movements are intact. There is no dysmetria on finger-to-nose and heel-knee-shin.    GAIT/STANCE: She needs push-up to get up from seated position, antalgic, dragging left leg   DIAGNOSTIC DATA (LABS, IMAGING, TESTING) - I reviewed patient records, labs, notes, testing and imaging myself where available.   ASSESSMENT AND PLAN  Nakhia Levitan is a 59 y.o. 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 Ph.D.  [facility] [address] [place], [place] [address] Ph: [phone] Fax: [phone]  CC: Lance Sell, NP

## 2020-03-21 ENCOUNTER — Encounter: Payer: Self-pay | Admitting: Neurology

## 2020-03-23 ENCOUNTER — Other Ambulatory Visit: Payer: Self-pay

## 2020-03-23 ENCOUNTER — Inpatient Hospital Stay: Payer: Medicare Other

## 2020-03-23 ENCOUNTER — Encounter: Payer: Self-pay | Admitting: Hematology and Oncology

## 2020-03-23 ENCOUNTER — Inpatient Hospital Stay (HOSPITAL_BASED_OUTPATIENT_CLINIC_OR_DEPARTMENT_OTHER): Payer: Medicare Other | Admitting: Hematology and Oncology

## 2020-03-23 VITALS — BP 120/66 | HR 77 | Temp 98.3°F | Resp 18 | Ht 67.0 in | Wt 163.8 lb

## 2020-03-23 DIAGNOSIS — G893 Neoplasm related pain (acute) (chronic): Secondary | ICD-10-CM | POA: Diagnosis not present

## 2020-03-23 DIAGNOSIS — C562 Malignant neoplasm of left ovary: Secondary | ICD-10-CM

## 2020-03-23 DIAGNOSIS — C182 Malignant neoplasm of ascending colon: Secondary | ICD-10-CM

## 2020-03-23 DIAGNOSIS — D61818 Other pancytopenia: Secondary | ICD-10-CM | POA: Diagnosis not present

## 2020-03-23 LAB — CBC WITH DIFFERENTIAL (CANCER CENTER ONLY)
Abs Immature Granulocytes: 0.01 10*3/uL (ref 0.00–0.07)
Basophils Absolute: 0 10*3/uL (ref 0.0–0.1)
Basophils Relative: 0 %
Eosinophils Absolute: 0 10*3/uL (ref 0.0–0.5)
Eosinophils Relative: 1 %
HCT: 33 % — ABNORMAL LOW (ref 36.0–46.0)
Hemoglobin: 11.5 g/dL — ABNORMAL LOW (ref 12.0–15.0)
Immature Granulocytes: 0 %
Lymphocytes Relative: 40 %
Lymphs Abs: 2.3 10*3/uL (ref 0.7–4.0)
MCH: 38.6 pg — ABNORMAL HIGH (ref 26.0–34.0)
MCHC: 34.8 g/dL (ref 30.0–36.0)
MCV: 110.7 fL — ABNORMAL HIGH (ref 80.0–100.0)
Monocytes Absolute: 0.4 10*3/uL (ref 0.1–1.0)
Monocytes Relative: 7 %
Neutro Abs: 2.9 10*3/uL (ref 1.7–7.7)
Neutrophils Relative %: 52 %
Platelet Count: 124 10*3/uL — ABNORMAL LOW (ref 150–400)
RBC: 2.98 MIL/uL — ABNORMAL LOW (ref 3.87–5.11)
RDW: 14.8 % (ref 11.5–15.5)
WBC Count: 5.7 10*3/uL (ref 4.0–10.5)
nRBC: 0 % (ref 0.0–0.2)

## 2020-03-23 LAB — CMP (CANCER CENTER ONLY)
ALT: 8 U/L (ref 0–44)
AST: 15 U/L (ref 15–41)
Albumin: 3.9 g/dL (ref 3.5–5.0)
Alkaline Phosphatase: 68 U/L (ref 38–126)
Anion gap: 8 (ref 5–15)
BUN: 16 mg/dL (ref 6–20)
CO2: 29 mmol/L (ref 22–32)
Calcium: 8.9 mg/dL (ref 8.9–10.3)
Chloride: 105 mmol/L (ref 98–111)
Creatinine: 1.03 mg/dL — ABNORMAL HIGH (ref 0.44–1.00)
GFR, Est AFR Am: >60 mL/min (ref >60)
GFR, Estimated: 59 mL/min — ABNORMAL LOW (ref >60)
Glucose, Bld: 108 mg/dL — ABNORMAL HIGH (ref 70–99)
Potassium: 3.8 mmol/L (ref 3.5–5.1)
Sodium: 142 mmol/L (ref 135–145)
Total Bilirubin: 0.4 mg/dL (ref 0.3–1.2)
Total Protein: 7 g/dL (ref 6.5–8.1)

## 2020-03-23 NOTE — Assessment & Plan Note (Signed)
She has no GI symptoms Her nausea and constipation are likely related to olaparib I will check tumor marker in her next visit

## 2020-03-23 NOTE — Assessment & Plan Note (Signed)
Her pancytopenia is stable/improved We will continue current treatment without dose adjustment She is not symptomatic

## 2020-03-23 NOTE — Assessment & Plan Note (Signed)
Her blood counts are stable since we restarted treatment She will continue current dose without dose adjustment For now, she will continue similar dose of olaparib I plan to repeat CT imaging next month for further follow-up

## 2020-03-23 NOTE — Assessment & Plan Note (Signed)
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

## 2020-03-23 NOTE — Progress Notes (Signed)
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 [facility]) Her blood counts are stable since we restarted treatment She will continue current dose without dose adjustment For now, she will continue similar dose of olaparib I plan to repeat CT imaging next month for further follow-up  Pancytopenia, acquired [facility]) Her pancytopenia is stable/improved We will continue current treatment without dose adjustment She is not symptomatic  Cancer of right colon [facility]) She has no GI symptoms Her nausea and constipation are likely related to olaparib I will check tumor marker in her next visit  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 CT ABDOMEN PELVIS W CONTRAST    Standing Status:   Future    Standing Expiration Date:   03/23/2021    Order Specific Question:   If indicated for the ordered procedure, I authorize the administration of contrast media per Radiology protocol    Answer:   Yes    Order Specific Question:   Preferred imaging location?    Answer:   [facility]    Order Specific Question:   Radiology Contrast Protocol - do NOT remove file path    Answer:   [email or web address]    Order Specific Question:   Is patient pregnant?    Answer:   No  . CEA (IN HOUSE-[facility])    Standing Status:   Future     Standing Expiration Date:   03/23/2021    All questions were answered. The patient knows to call the clinic with any problems, questions or concerns. The total time spent in the appointment was 20 minutes encounter with patients including review of chart and various tests results, discussions about plan of care and coordination of care plan   Heath Lark, MD 03/23/2020 6:22 PM  INTERVAL HISTORY: Please see below for problem oriented charting. She returns for further follow-up while on olaparib She is doing well She has some mild nausea and constipation, alleviated with antiemetics and laxative respectively Her pain control is stable No recent abdominal bloating No recent infection, fever or chills  SUMMARY OF ONCOLOGIC HISTORY: Oncology History Overview Note  BRCA1 positive   Cancer of right colon ([place])  09/16/2014 Imaging   CT abd/pel:  a 3.8cm cecal mass with associated ileocecal intussusception, and a 9.1cm solid and cystic mass in the left pelvis,   09/18/2014 Surgery   right hemicolectomy and terminal ileoectomy   09/18/2014 Pathologic Stage   pT3pN1Mx, tumor extends into pericolonic soft tissue and is less than 43m from the serosal surface, LVI 8-), PNI (-), all 23 node negative, one soft tissue tumor deposit, surgical margins negative.    09/18/2014 Initial Diagnosis   Adenocarcinoma of right colon   08/06/2016 Imaging   CT chest/abd/pelvis with contrast IMPRESSION: 1. No recurrent malignancy identified. 2. 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 On the prior exam from 09/13/2015 there was back flow  of contrast in the left external iliac artery to supply the left internal iliac artery ; this back flow of contrast is no longer present in the left external iliac artery is occluded. 3. Several pulmonary nodules at or below 4 mm diameter, unchanged. 4. 3 mm hypodensity posteriorly in the body  of the pancreas, not well seen previously and subtle today, likely incidental, but merits observation. 5. 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 Prominent degenerative spurring of both acetabula. 6. Lumbar spondylosis and degenerative disc disease causing prominent impingement at L4-5 and mild impingement at L5-S1.   03/08/2017 Imaging   CT CAP W Contrast 03/08/17 IMPRESSION: Chest Impression: 1. Stable small pulmonary nodules within LEFT lung. 2. No mediastinal lymphadenopathy. Abdomen / Pelvis Impression: 1. No evidence of metastatic colorectal cancer or ovarian cancer within the abdomen pelvis. 2. No lymphadenopathy. 3. Postsurgical change consistent RIGHT hemicolectomy and hysterectomy 4. A fem-fem arterial bypass noted.   11/13/2017 Imaging   CT AP w contrast IMPRESSION: 1. No findings of recurrent malignancy in the abdomen or pelvis. 2. Other imaging findings of potential clinical significance: Chronic AVN of both femoral heads without contour abnormality. Lumbar impingement at L4-5 and L5-S1. Aortic Atherosclerosis (ICD10-I70.0). 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 Partial right hemicolectomy. Hysterectomy.   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 This was discovered during her workup for colon cancer.   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 Tumor size 3.7 cm, no lymph nodes removed. T1cNx. Tumor cells are positive for cytokeratin 7 and estrogen receptor.   11/01/2014 Surgery   B/l Salpingo-oophorectomy.   11/01/2014 Initial Diagnosis   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   11/28/2014 Cancer Staging   Staging form: Ovary, AJCC 7th  Edition - Clinical: Stage III (rT3, N0, M0) - Signed by Heath Lark, MD on 06/09/2019   03/08/2017 Imaging   CT CAP W Contrast 03/08/17 IMPRESSION: Chest Impression: 1. Stable small pulmonary nodules within LEFT lung. 2. No mediastinal lymphadenopathy. Abdomen / Pelvis Impression: 1. No evidence of metastatic colorectal cancer or ovarian cancer within the abdomen pelvis. 2. No lymphadenopathy. 3. Postsurgical change consistent RIGHT hemicolectomy and hysterectomy 4. A fem-fem arterial bypass noted.   03/12/2017 Mammogram   MM Right Breast 03/12/17 IMPRESSION: No mammographic or sonographic evidence of malignancy. No correlate identified for the abnormal enhancement seen within the right nipple on MRI of 02/27/2017.    04/02/2017 Pathology Results   Surgical Pathology of Nipple Biopsy: 04/02/17 Diagnosis Nipple Biopsy, Right - BENIGN NIPPLE TISSUE. - NO ATYPIA OR MALIGNANCY.    11/13/2017 Imaging   1. No findings of recurrent malignancy in the abdomen or pelvis. 2. Other imaging findings of potential clinical significance: Chronic AVN of both femoral heads without contour abnormality. Lumbar impingement at L4-5 and L5-S1. Aortic Atherosclerosis (ICD10-I70.0). 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 Partial right hemicolectomy. Hysterectomy.   03/22/2018 Mammogram   Screening mammogram IMPRESSION No evidence of malignancy.     05/02/2019 Tumor Marker   Patient's tumor was tested for the following markers: CA-125 Results of the tumor marker test revealed 360   05/24/2019 Imaging  CT abdomen and pelvis New peritoneal carcinomatosis in the pelvis and right lower quadrant.   06/06/2019 Imaging   1. Tiny bilateral pulmonary nodules stable since 11/12/2018. Continued close attention on follow-up recommended. 2. Incompletely visualized upper abdominal lymphadenopathy better characterized on the  abdomen CT from 2 weeks ago. 3.  Aortic Atherosclerois (ICD10-170.0) 4.  Emphysema. ([id number]-B52.9)   06/07/2019 Procedure   Technically successful CT guided core needle biopsy of indeterminate peritoneal nodule within the right lower abdomen/pelvis.   06/07/2019 Pathology Results   Soft Tissue Needle Core Biopsy, peritoneal nodule within right lower abdomen - METASTATIC CARCINOMA CONSISTENT WITH PATIENT'S CLINICAL HISTORY OF PRIMARY OVARIAN CARCINOMA. SEE NOTE Diagnosis Note Immunohistochemical stains show that the tumor cells are positive for CK7, ER and PAX 8; and negative for CK20 and CDX2. This immunostaining profile is consistent with above diagnosis.   06/16/2019 Tumor Marker   Patient's tumor was tested for the following markers: CA-125 Results of the tumor marker test revealed 499   06/20/2019 - 11/14/2019 Chemotherapy   The patient had carboplatin and taxol   07/11/2019 Tumor Marker   Patient's tumor was tested for the following markers: CA-125 Results of the tumor marker test revealed 438   08/05/2019 Tumor Marker   Patient's tumor was tested for the following markers: CA-125 Results of the tumor marker test revealed 272.   08/25/2019 Imaging   Ct chest, abdomen and pelvis 1. Slight interval decrease in size of a right lower quadrant peritoneal nodule measuring 1.7 x 1.6 cm, previously 2.2 x 2.2 cm (series 2, image 94, series 5, image 58). Findings are consistent with treatment response.   2. No significant change in ill-defined soft tissue about the sigmoid colon in the hysterectomy bed (series 2, image 101).   3.  Status post hysterectomy.   4. No evidence of metastatic disease in the chest. Stable benign small pulmonary nodules.   5.  Coronary artery disease.  Aortic Atherosclerosis (ICD10-I70.0).     08/26/2019 Tumor Marker   Patient's tumor was tested for the following markers: CA-125 Results of the tumor marker test revealed 189.   09/26/2019 Tumor Marker    Patient's tumor was tested for the following markers: CA-125 Results of the tumor marker test revealed 137   10/03/2019 Tumor Marker   Patient's tumor was tested for the following markers: CA-125 Results of the tumor marker test revealed 137.   10/31/2019 Tumor Marker   Patient's tumor was tested for the following markers: CA-125 Results of the tumor marker test revealed 101   11/29/2019 Imaging   1. Right lower quadrant peritoneal implant is decreased. Stable irregular nodular pelvic peritoneal thickening superior to the vaginal cuff. 2. No new or progressive metastatic disease. No abdominopelvic lymphadenopathy. 3.  Aortic Atherosclerosis (ICD10-I70.0).   12/15/2019 -  Chemotherapy   The patient had Lynparza for chemotherapy treatment.     12/23/2019 Tumor Marker   Patient's tumor was tested for the following markers: CA-125 Results of the tumor marker test revealed 55.9   01/02/2020 Tumor Marker   Patient's tumor was tested for the following markers: CA-125. Results of the tumor marker test revealed 58.6   02/03/2020 Tumor Marker   Patient's tumor was tested for the following markers: CA-125 Results of the tumor marker test revealed 52.2   02/24/2020 Tumor Marker   Patient's tumor was tested for the following markers: CA-125 Results of the tumor marker test revealed 64.8     REVIEW OF SYSTEMS:   Constitutional: Denies fevers, chills  or abnormal weight loss Eyes: Denies blurriness of vision Ears, nose, mouth, throat, and face: Denies mucositis or sore throat Respiratory: Denies cough, dyspnea or wheezes Cardiovascular: Denies palpitation, chest discomfort or lower extremity swelling Skin: Denies abnormal skin rashes Lymphatics: Denies new lymphadenopathy or easy bruising Neurological:Denies numbness, tingling or new weaknesses Behavioral/Psych: Mood is stable, no new changes  All other systems were reviewed with the patient and are negative.  I have reviewed the past medical  history, past surgical history, social history and family history with the patient and they are unchanged from previous note.  ALLERGIES:  is allergic to other; demerol [meperidine]; morphine and related; oxycodone; penicillins; strawberry flavor; tylenol [acetaminophen]; and ibuprofen.  MEDICATIONS:  Current Outpatient Medications  Medication Sig Dispense Refill  . acetaminophen (TYLENOL) 325 MG tablet Take 325 mg by mouth as needed.    . docusate sodium 100 MG CAPS Take 100 mg by mouth 2 (two) times daily as needed for mild constipation. 10 capsule 0  . HYDROcodone-acetaminophen (NORCO/VICODIN) 5-325 MG tablet Take 1 tablet by mouth every 6 (six) hours as needed for moderate pain. 60 tablet 0  . lidocaine (LIDODERM) 5 % PLACE 1 PATCH ONTO THE SKIN EVERY DAY.REMOVE AND DISCARD PATCH WITHIN 12 HOURS OR AS DIRECTED BY PRESCRIBER 30 patch 1  . LORazepam (ATIVAN) 0.5 MG tablet Take 1 tablet (0.5 mg total) by mouth 3 (three) times daily as needed for anxiety. 10 tablet 0  . losartan (COZAAR) 50 MG tablet Take 1 tablet (50 mg total) by mouth daily. 90 tablet 2  . olaparib (LYNPARZA) 100 MG tablet Take 2 tablets (200 mg total) by mouth 2 (two) times daily. Swallow whole. May take with food to decrease nausea and vomiting. 120 tablet 11  . ondansetron (ZOFRAN) 8 MG tablet Take 1 tablet (8 mg total) by mouth every 8 (eight) hours as needed for nausea or vomiting. 60 tablet 1  . pregabalin (LYRICA) 200 MG capsule Take 1 capsule (200 mg total) by mouth every 8 (eight) hours. Please give patient brand name Lyrica 90 capsule 5  . prochlorperazine (COMPAZINE) 10 MG tablet TAKE 1 TABLET(10 MG) BY MOUTH EVERY 6 HOURS AS NEEDED FOR NAUSEA OR VOMITING 90 tablet 1   No current facility-administered medications for this visit.    PHYSICAL EXAMINATION: ECOG PERFORMANCE STATUS: 1 - Symptomatic but completely ambulatory  Vitals:   03/23/20 1337  BP: 120/66  Pulse: 77  Resp: 18  Temp: 98.3 F (36.8 C)  SpO2:  100%   Filed Weights   03/23/20 1337  Weight: 163 lb 12.8 oz (74.3 kg)    GENERAL:alert, no distress and comfortable SKIN: skin color, texture, turgor are normal, no rashes or significant lesions EYES: normal, Conjunctiva are pink and non-injected, sclera clear OROPHARYNX:no exudate, no erythema and lips, buccal mucosa, and tongue normal  NECK: supple, thyroid normal size, non-tender, without nodularity LYMPH:  no palpable lymphadenopathy in the cervical, axillary or inguinal LUNGS: clear to auscultation and percussion with normal breathing effort HEART: regular rate & rhythm and no murmurs and no lower extremity edema ABDOMEN:abdomen soft, non-tender and normal bowel sounds Musculoskeletal:no cyanosis of digits and no clubbing  NEURO: alert & oriented x 3 with fluent speech, no focal motor/sensory deficits  LABORATORY DATA:  I have reviewed the data as listed    Component Value Date/Time   NA 142 03/23/2020 1320   NA 141 10/07/2017 1436   K 3.8 03/23/2020 1320   K 4.4 10/07/2017 1436   CL  105 03/23/2020 1320   CO2 29 03/23/2020 1320   CO2 26 10/07/2017 1436   GLUCOSE 108 (H) 03/23/2020 1320   GLUCOSE 85 10/07/2017 1436   BUN 16 03/23/2020 1320   BUN 14.9 10/07/2017 1436   CREATININE 1.03 (H) 03/23/2020 1320   CREATININE 1.0 10/07/2017 1436   CALCIUM 8.9 03/23/2020 1320   CALCIUM 9.5 10/07/2017 1436   PROT 7.0 03/23/2020 1320   PROT 7.8 10/07/2017 1436   ALBUMIN 3.9 03/23/2020 1320   ALBUMIN 3.9 10/07/2017 1436   AST 15 03/23/2020 1320   AST 15 10/07/2017 1436   ALT 8 03/23/2020 1320   ALT 7 10/07/2017 1436   ALKPHOS 68 03/23/2020 1320   ALKPHOS 81 10/07/2017 1436   BILITOT 0.4 03/23/2020 1320   BILITOT 0.54 10/07/2017 1436   GFRNONAA 59 (L) 03/23/2020 1320   GFRAA >60 03/23/2020 1320    No results found for: SPEP, UPEP  Lab Results  Component Value Date   WBC 5.7 03/23/2020   NEUTROABS 2.9 03/23/2020   HGB 11.5 (L) 03/23/2020   HCT 33.0 (L) 03/23/2020    MCV 110.7 (H) 03/23/2020   PLT 124 (L) 03/23/2020      Chemistry      Component Value Date/Time   NA 142 03/23/2020 1320   NA 141 10/07/2017 1436   K 3.8 03/23/2020 1320   K 4.4 10/07/2017 1436   CL 105 03/23/2020 1320   CO2 29 03/23/2020 1320   CO2 26 10/07/2017 1436   BUN 16 03/23/2020 1320   BUN 14.9 10/07/2017 1436   CREATININE 1.03 (H) 03/23/2020 1320   CREATININE 1.0 10/07/2017 1436      Component Value Date/Time   CALCIUM 8.9 03/23/2020 1320   CALCIUM 9.5 10/07/2017 1436   ALKPHOS 68 03/23/2020 1320   ALKPHOS 81 10/07/2017 1436   AST 15 03/23/2020 1320   AST 15 10/07/2017 1436   ALT 8 03/23/2020 1320   ALT 7 10/07/2017 1436   BILITOT 0.4 03/23/2020 1320   BILITOT 0.54 10/07/2017 1436

## 2020-04-05 DIAGNOSIS — Z23 Encounter for immunization: Secondary | ICD-10-CM | POA: Diagnosis not present

## 2020-04-09 ENCOUNTER — Telehealth: Payer: Self-pay

## 2020-04-09 ENCOUNTER — Other Ambulatory Visit: Payer: Self-pay | Admitting: Hematology and Oncology

## 2020-04-09 MED ORDER — HYDROCODONE-ACETAMINOPHEN 5-325 MG PO TABS: 1.0000 | ORAL_TABLET | Freq: Four times a day (QID) | ORAL | 0 refills | Status: DC | PRN

## 2020-04-09 NOTE — Telephone Encounter (Signed)
Called patient and let her know that Dr Alvy Bimler refilled her hydrocodone medication. Also let her know that her next appointment is 04/20/20 @9 :45a

## 2020-04-09 NOTE — Telephone Encounter (Signed)
Received TC from patient stating that she needs a refill on her hydrocodone. Dr Alvy Bimler made aware of request.

## 2020-04-16 MED FILL — LYNPARZA 100 MG TAB: 100 | 30 days supply | Qty: 120 | Fill #4

## 2020-04-19 ENCOUNTER — Ambulatory Visit (HOSPITAL_COMMUNITY)
Admission: RE | Admit: 2020-04-19 | Discharge: 2020-04-19 | Disposition: A | Discharge status: Home / Self Care | Payer: Medicare Other | Source: Ambulatory Visit | Attending: Hematology and Oncology | Admitting: Hematology and Oncology

## 2020-04-19 ENCOUNTER — Other Ambulatory Visit: Payer: Self-pay

## 2020-04-19 DIAGNOSIS — C569 Malignant neoplasm of unspecified ovary: Secondary | ICD-10-CM | POA: Diagnosis not present

## 2020-04-19 DIAGNOSIS — C562 Malignant neoplasm of left ovary: Secondary | ICD-10-CM | POA: Insufficient documentation

## 2020-04-19 DIAGNOSIS — K5730 Diverticulosis of large intestine without perforation or abscess without bleeding: Secondary | ICD-10-CM | POA: Diagnosis not present

## 2020-04-19 MED ORDER — IOHEXOL 300 MG/ML  SOLN
100.0000 mL | Freq: Once | INTRAMUSCULAR | Status: AC | PRN
  Administered 2020-04-19: 100 mL via INTRAVENOUS

## 2020-04-20 ENCOUNTER — Other Ambulatory Visit: Payer: Self-pay | Admitting: Hematology and Oncology

## 2020-04-20 ENCOUNTER — Other Ambulatory Visit: Payer: Self-pay

## 2020-04-20 ENCOUNTER — Encounter: Payer: Self-pay | Admitting: Hematology and Oncology

## 2020-04-20 ENCOUNTER — Inpatient Hospital Stay: Payer: Medicare Other | Attending: Hematology

## 2020-04-20 ENCOUNTER — Inpatient Hospital Stay (HOSPITAL_BASED_OUTPATIENT_CLINIC_OR_DEPARTMENT_OTHER): Payer: Medicare Other | Admitting: Hematology and Oncology

## 2020-04-20 DIAGNOSIS — R110 Nausea: Secondary | ICD-10-CM | POA: Insufficient documentation

## 2020-04-20 DIAGNOSIS — C562 Malignant neoplasm of left ovary: Secondary | ICD-10-CM | POA: Insufficient documentation

## 2020-04-20 DIAGNOSIS — G893 Neoplasm related pain (acute) (chronic): Secondary | ICD-10-CM

## 2020-04-20 DIAGNOSIS — D61818 Other pancytopenia: Secondary | ICD-10-CM

## 2020-04-20 DIAGNOSIS — Z90710 Acquired absence of both cervix and uterus: Secondary | ICD-10-CM | POA: Insufficient documentation

## 2020-04-20 DIAGNOSIS — Z9221 Personal history of antineoplastic chemotherapy: Secondary | ICD-10-CM | POA: Insufficient documentation

## 2020-04-20 DIAGNOSIS — C182 Malignant neoplasm of ascending colon: Secondary | ICD-10-CM | POA: Diagnosis not present

## 2020-04-20 DIAGNOSIS — C786 Secondary malignant neoplasm of retroperitoneum and peritoneum: Secondary | ICD-10-CM | POA: Diagnosis not present

## 2020-04-20 DIAGNOSIS — G609 Hereditary and idiopathic neuropathy, unspecified: Secondary | ICD-10-CM | POA: Insufficient documentation

## 2020-04-20 DIAGNOSIS — Z85038 Personal history of other malignant neoplasm of large intestine: Secondary | ICD-10-CM | POA: Diagnosis not present

## 2020-04-20 DIAGNOSIS — J439 Emphysema, unspecified: Secondary | ICD-10-CM | POA: Insufficient documentation

## 2020-04-20 DIAGNOSIS — Z79899 Other long term (current) drug therapy: Secondary | ICD-10-CM | POA: Diagnosis not present

## 2020-04-20 LAB — CMP (CANCER CENTER ONLY)
ALT: 7 U/L (ref 0–44)
AST: 13 U/L — ABNORMAL LOW (ref 15–41)
Albumin: 4 g/dL (ref 3.5–5.0)
Alkaline Phosphatase: 82 U/L (ref 38–126)
Anion gap: 11 (ref 5–15)
BUN: 17 mg/dL (ref 6–20)
CO2: 28 mmol/L (ref 22–32)
Calcium: 9.6 mg/dL (ref 8.9–10.3)
Chloride: 104 mmol/L (ref 98–111)
Creatinine: 1.04 mg/dL — ABNORMAL HIGH (ref 0.44–1.00)
GFR, Est AFR Am: >60 mL/min (ref >60)
GFR, Estimated: 59 mL/min — ABNORMAL LOW (ref >60)
Glucose, Bld: 96 mg/dL (ref 70–99)
Potassium: 3.9 mmol/L (ref 3.5–5.1)
Sodium: 143 mmol/L (ref 135–145)
Total Bilirubin: 0.4 mg/dL (ref 0.3–1.2)
Total Protein: 7.2 g/dL (ref 6.5–8.1)

## 2020-04-20 LAB — CBC WITH DIFFERENTIAL (CANCER CENTER ONLY)
Abs Immature Granulocytes: 0.01 10*3/uL (ref 0.00–0.07)
Basophils Absolute: 0 10*3/uL (ref 0.0–0.1)
Basophils Relative: 1 %
Eosinophils Absolute: 0.1 10*3/uL (ref 0.0–0.5)
Eosinophils Relative: 1 %
HCT: 34.8 % — ABNORMAL LOW (ref 36.0–46.0)
Hemoglobin: 12.3 g/dL (ref 12.0–15.0)
Immature Granulocytes: 0 %
Lymphocytes Relative: 52 %
Lymphs Abs: 3.4 10*3/uL (ref 0.7–4.0)
MCH: 38.8 pg — ABNORMAL HIGH (ref 26.0–34.0)
MCHC: 35.3 g/dL (ref 30.0–36.0)
MCV: 109.8 fL — ABNORMAL HIGH (ref 80.0–100.0)
Monocytes Absolute: 0.6 10*3/uL (ref 0.1–1.0)
Monocytes Relative: 10 %
Neutro Abs: 2.3 10*3/uL (ref 1.7–7.7)
Neutrophils Relative %: 36 %
Platelet Count: 129 10*3/uL — ABNORMAL LOW (ref 150–400)
RBC: 3.17 MIL/uL — ABNORMAL LOW (ref 3.87–5.11)
RDW: 14.2 % (ref 11.5–15.5)
WBC Count: 6.5 10*3/uL (ref 4.0–10.5)
nRBC: 0 % (ref 0.0–0.2)

## 2020-04-20 LAB — CEA (IN HOUSE-CHCC): CEA (CHCC-In House): 1.41 ng/mL (ref 0.00–5.00)

## 2020-04-20 NOTE — Assessment & Plan Note (Signed)
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

## 2020-04-20 NOTE — Assessment & Plan Note (Signed)
CEA is within normal limits She has no signs of cancer recurrence on imaging study

## 2020-04-20 NOTE — Assessment & Plan Note (Signed)
Her pancytopenia is stable/improved We will continue current treatment without dose adjustment She is not symptomatic

## 2020-04-20 NOTE — Assessment & Plan Note (Signed)
Her blood counts are stable since  CT imaging show excellent response to therapy She will continue current dose without dose adjustment I will continue to see her every 2 months for further follow-up I plan to repeat imaging study again in 4 months, probably due around end of September

## 2020-04-20 NOTE — Progress Notes (Signed)
[facility] OFFICE PROGRESS NOTE  Patient Care Team: Marrian Salvage, Dawson as PCP - General (Internal Medicine) Truitt Merle, MD as Consulting Physician (Hematology) Judeth Horn, MD as Consulting Physician (General Surgery) Nat Math, MD as Referring Physician (Obstetrics and Gynecology) Elam Dutch, MD as Consulting Physician (Vascular Surgery) [name], MD as Attending Physician (Gynecologic Oncology) Heath Lark, MD as Consulting Physician (Hematology and Oncology)  ASSESSMENT & PLAN:  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 [facility]) Her blood counts are stable since  CT imaging show excellent response to therapy She will continue current dose without dose adjustment I will continue to see her every 2 months for further follow-up I plan to repeat imaging study again in 4 months, probably due around end of September  Cancer of right colon [facility]) CEA is within normal limits She has no signs of cancer recurrence on imaging study  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   All questions were answered. The patient knows to call the clinic with any problems, questions or concerns. The total time spent in the appointment was 20 minutes encounter with patients including review of chart and various tests results, discussions about plan of care and coordination of care plan   Heath Lark, MD 04/20/2020 5:16 PM  INTERVAL HISTORY: Please see below for problem oriented charting. She returns for chemotherapy and follow-up She is doing well Her chronic pain is stable No recent constipation She has occasional rare nausea No recent infection,  fever or chills She is taking medications as prescribed and compliant  SUMMARY OF ONCOLOGIC HISTORY: Oncology History Overview Note  BRCA1 positive   Cancer of right colon ([place])  09/16/2014 Imaging   CT abd/pel:  a 3.8cm cecal mass with associated ileocecal intussusception, and a 9.1cm solid and cystic mass in the left pelvis,   09/18/2014 Surgery   right hemicolectomy and terminal ileoectomy   09/18/2014 Pathologic Stage   pT3pN1Mx, tumor extends into pericolonic soft tissue and is less than 33m from the serosal surface, LVI 8-), PNI (-), all 23 node negative, one soft tissue tumor deposit, surgical margins negative.    09/18/2014 Initial Diagnosis   Adenocarcinoma of right colon   08/06/2016 Imaging   CT chest/abd/pelvis with contrast IMPRESSION: 1. No recurrent malignancy identified. 2. 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 On the prior exam from 09/13/2015 there was back flow of contrast in the left external iliac artery to supply the left internal iliac artery ; this back flow of contrast is no longer present in the left external iliac artery is occluded. 3. Several pulmonary nodules at or below 4 mm diameter, unchanged. 4. 3 mm hypodensity posteriorly in the body of the pancreas, not well seen previously and subtle today, likely incidental, but merits observation. 5. 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 Prominent degenerative spurring of both acetabula. 6. Lumbar spondylosis and degenerative disc disease causing prominent impingement at L4-5 and mild impingement at L5-S1.   03/08/2017 Imaging   CT CAP W Contrast 03/08/17 IMPRESSION: Chest Impression: 1. Stable small pulmonary nodules within LEFT lung. 2. No mediastinal lymphadenopathy.  Abdomen / Pelvis Impression: 1. No evidence  of metastatic colorectal cancer or ovarian cancer within the abdomen pelvis. 2. No  lymphadenopathy. 3. Postsurgical change consistent RIGHT hemicolectomy and hysterectomy 4. A fem-fem arterial bypass noted.   11/13/2017 Imaging   CT AP w contrast IMPRESSION: 1. No findings of recurrent malignancy in the abdomen or pelvis. 2. Other imaging findings of potential clinical significance:Chronic AVN of both femoral heads without contour abnormality. Lumbar impingement at L4-5 and L5-S1. Aortic Atherosclerosis (ICD10-I70.0). 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 Partial right hemicolectomy. Hysterectomy.   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 ([place])  10/09/2014 Imaging   CT of the abdomen and pelvis showed a 9.1 cm solid and cystic mass in the left pelvis concerning for malignancy. This was discovered during her workup for colon cancer.   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 Tumor size 3.7 cm, no lymph nodes removed. T1cNx. Tumor cells are positive for cytokeratin 7 and estrogen receptor.   11/01/2014 Surgery   B/l Salpingo-oophorectomy.   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 Stable small pulmonary nodules within LEFT lung. 2. No mediastinal lymphadenopathy. Abdomen / Pelvis Impression: 1. No evidence of metastatic colorectal cancer or ovarian cancer within the abdomen pelvis. 2. No lymphadenopathy. 3. Postsurgical change consistent RIGHT hemicolectomy and hysterectomy 4. A fem-fem arterial bypass noted.   03/12/2017 Mammogram   MM Right Breast 03/12/17 IMPRESSION: No mammographic or sonographic evidence of malignancy. No  correlate identified for the abnormal enhancement seen within the right nipple on MRI of 02/27/2017.    04/02/2017 Pathology Results   Surgical Pathology of Nipple Biopsy: 04/02/17 Diagnosis Nipple Biopsy, Right - BENIGN NIPPLE TISSUE. - NO ATYPIA OR MALIGNANCY.    11/13/2017 Imaging   1. No findings of recurrent malignancy in the abdomen or pelvis. 2. Other imaging findings of potential clinical significance: Chronic AVN of both femoral heads without contour abnormality. Lumbar impingement at L4-5 and L5-S1. Aortic Atherosclerosis (ICD10-I70.0). 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 Partial right hemicolectomy. Hysterectomy.   03/22/2018 Mammogram   Screening mammogram IMPRESSION No evidence of malignancy.     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   06/06/2019 Imaging   1. Tiny bilateral pulmonary nodules stable since 11/12/2018. Continued close attention on follow-up recommended. 2. Incompletely visualized upper abdominal lymphadenopathy better characterized on the abdomen CT from 2 weeks ago. 3.  Aortic Atherosclerois (ICD10-170.0) 4.  Emphysema. ([id number])   06/07/2019 Procedure   Technically successful CT guided core needle biopsy of indeterminate peritoneal nodule within the right lower abdomen/pelvis.   06/07/2019 Pathology Results   Soft Tissue Needle Core Biopsy, peritoneal nodule within right lower abdomen - METASTATIC CARCINOMA CONSISTENT WITH PATIENT'S CLINICAL HISTORY OF PRIMARY OVARIAN CARCINOMA. SEE NOTE Diagnosis Note Immunohistochemical stains show that the tumor cells are positive for CK7, ER and PAX 8; and negative for  CK20 and CDX2. This immunostaining profile is  consistent with above diagnosis.   06/16/2019 Tumor Marker   Patient's tumor was tested for the following markers: CA-125 Results of the tumor marker test revealed 499   06/20/2019 - 11/14/2019 Chemotherapy   The patient had carboplatin and taxol   07/11/2019 Tumor Marker   Patient's tumor was tested for the following markers: CA-125 Results of the tumor marker test revealed 438   08/05/2019 Tumor Marker   Patient's tumor was tested for the following markers: CA-125 Results of the tumor marker test revealed 272.   08/25/2019 Imaging   Ct chest, abdomen and pelvis 1. Slight interval decrease in size of a right lower quadrant peritoneal nodule measuring 1.7 x 1.6 cm, previously 2.2 x 2.2 cm (series 2, image 94, series 5, image 58). Findings are consistent with treatment response.   2. No significant change in ill-defined soft tissue about the sigmoid colon in the hysterectomy bed (series 2, image 101).   3.  Status post hysterectomy.   4. No evidence of metastatic disease in the chest. Stable benign small pulmonary nodules.   5.  Coronary artery disease.  Aortic Atherosclerosis (ICD10-I70.0).     08/26/2019 Tumor Marker   Patient's tumor was tested for the following markers: CA-125 Results of the tumor marker test revealed 189.   09/26/2019 Tumor Marker   Patient's tumor was tested for the following markers: CA-125 Results of the tumor marker test revealed 137   10/03/2019 Tumor Marker   Patient's tumor was tested for the following markers: CA-125 Results of the tumor marker test revealed 137.   10/31/2019 Tumor Marker   Patient's tumor was tested for the following markers: CA-125 Results of the tumor marker test revealed 101   11/29/2019 Imaging   1. Right lower quadrant peritoneal implant is decreased. Stable irregular nodular pelvic peritoneal thickening superior to the vaginal cuff. 2. No new or progressive metastatic disease. No abdominopelvic lymphadenopathy. 3.  Aortic  Atherosclerosis (ICD10-I70.0).   12/15/2019 -  Chemotherapy   The patient had Lynparza for chemotherapy treatment.     12/23/2019 Tumor Marker   Patient's tumor was tested for the following markers: CA-125 Results of the tumor marker test revealed 55.9   01/02/2020 Tumor Marker   Patient's tumor was tested for the following markers: CA-125. Results of the tumor marker test revealed 58.6   02/03/2020 Tumor Marker   Patient's tumor was tested for the following markers: CA-125 Results of the tumor marker test revealed 52.2   02/24/2020 Tumor Marker   Patient's tumor was tested for the following markers: CA-125 Results of the tumor marker test revealed 64.8   04/19/2020 Imaging   1. Continued slight interval decrease in size of a soft tissue nodule in the right paracolic gutter, consistent with treatment response. 2. Soft tissue in the hysterectomy bed near the distal sigmoid colon and rectum may be slightly decreased in size, difficult to measure due to configuration but apparently diminished in size, also consistent with treatment response. 3. Status post hysterectomy and oophorectomy as well as right colon resection. 4. No evidence of new metastatic disease in the abdomen or pelvis. 5. Redemonstrated long segment occlusion of the left common, internal, and external iliac arteries. Status post femoral-femoral bypass grafting with reconstitution of the left common femoral artery. Aortic Atherosclerosis (ICD10-I70.0).     REVIEW OF SYSTEMS:   Constitutional: Denies fevers, chills or abnormal weight loss Eyes: Denies blurriness of vision Ears, nose, mouth, throat, and face: Denies  mucositis or sore throat Respiratory: Denies cough, dyspnea or wheezes Cardiovascular: Denies palpitation, chest discomfort or lower extremity swelling Gastrointestinal:  Denies nausea, heartburn or change in bowel habits Skin: Denies abnormal skin rashes Lymphatics: Denies new lymphadenopathy or easy  bruising Neurological:Denies numbness, tingling or new weaknesses Behavioral/Psych: Mood is stable, no new changes  All other systems were reviewed with the patient and are negative.  I have reviewed the past medical history, past surgical history, social history and family history with the patient and they are unchanged from previous note.  ALLERGIES:  is allergic to other; demerol [meperidine]; morphine and related; oxycodone; penicillins; strawberry flavor; tylenol [acetaminophen]; and ibuprofen.  MEDICATIONS:  Current Outpatient Medications  Medication Sig Dispense Refill  . acetaminophen (TYLENOL) 325 MG tablet Take 325 mg by mouth as needed.    . docusate sodium 100 MG CAPS Take 100 mg by mouth 2 (two) times daily as needed for mild constipation. 10 capsule 0  . HYDROcodone-acetaminophen (NORCO/VICODIN) 5-325 MG tablet Take 1 tablet by mouth every 6 (six) hours as needed for moderate pain. 60 tablet 0  . lidocaine (LIDODERM) 5 % PLACE 1 PATCH ONTO THE SKIN EVERY DAY.REMOVE AND DISCARD PATCH WITHIN 12 HOURS OR AS DIRECTED BY PRESCRIBER 30 patch 1  . LORazepam (ATIVAN) 0.5 MG tablet Take 1 tablet (0.5 mg total) by mouth 3 (three) times daily as needed for anxiety. 10 tablet 0  . losartan (COZAAR) 50 MG tablet Take 1 tablet (50 mg total) by mouth daily. 90 tablet 2  . olaparib (LYNPARZA) 100 MG tablet Take 2 tablets (200 mg total) by mouth 2 (two) times daily. Swallow whole. May take with food to decrease nausea and vomiting. 120 tablet 11  . ondansetron (ZOFRAN) 8 MG tablet Take 1 tablet (8 mg total) by mouth every 8 (eight) hours as needed for nausea or vomiting. 60 tablet 1  . pregabalin (LYRICA) 200 MG capsule Take 1 capsule (200 mg total) by mouth every 8 (eight) hours. Please give patient brand name Lyrica 90 capsule 5  . prochlorperazine (COMPAZINE) 10 MG tablet TAKE 1 TABLET(10 MG) BY MOUTH EVERY 6 HOURS AS NEEDED FOR NAUSEA OR VOMITING 90 tablet 1   No current facility-administered  medications for this visit.    PHYSICAL EXAMINATION: ECOG PERFORMANCE STATUS: 1 - Symptomatic but completely ambulatory  Vitals:   04/20/20 1001  BP: 139/67  Pulse: 66  Resp: 18  Temp: 98.4 F (36.9 C)  SpO2: 99%   Filed Weights   04/20/20 1001  Weight: 159 lb 6.4 oz (72.3 kg)    GENERAL:alert, no distress and comfortable NEURO: alert & oriented x 3 with fluent speech, no focal motor/sensory deficits  LABORATORY DATA:  I have reviewed the data as listed    Component Value Date/Time   NA 143 04/20/2020 0926   NA 141 10/07/2017 1436   K 3.9 04/20/2020 0926   K 4.4 10/07/2017 1436   CL 104 04/20/2020 0926   CO2 28 04/20/2020 0926   CO2 26 10/07/2017 1436   GLUCOSE 96 04/20/2020 0926   GLUCOSE 85 10/07/2017 1436   BUN 17 04/20/2020 0926   BUN 14.9 10/07/2017 1436   CREATININE 1.04 (H) 04/20/2020 0926   CREATININE 1.0 10/07/2017 1436   CALCIUM 9.6 04/20/2020 0926   CALCIUM 9.5 10/07/2017 1436   PROT 7.2 04/20/2020 0926   PROT 7.8 10/07/2017 1436   ALBUMIN 4.0 04/20/2020 0926   ALBUMIN 3.9 10/07/2017 1436   AST 13 (L) 04/20/2020 4782  AST 15 10/07/2017 1436   ALT 7 04/20/2020 0926   ALT 7 10/07/2017 1436   ALKPHOS 82 04/20/2020 0926   ALKPHOS 81 10/07/2017 1436   BILITOT 0.4 04/20/2020 0926   BILITOT 0.54 10/07/2017 1436   GFRNONAA 59 (L) 04/20/2020 0926   GFRAA >60 04/20/2020 0926    No results found for: SPEP, UPEP  Lab Results  Component Value Date   WBC 6.5 04/20/2020   NEUTROABS 2.3 04/20/2020   HGB 12.3 04/20/2020   HCT 34.8 (L) 04/20/2020   MCV 109.8 (H) 04/20/2020   PLT 129 (L) 04/20/2020      Chemistry      Component Value Date/Time   NA 143 04/20/2020 0926   NA 141 10/07/2017 1436   K 3.9 04/20/2020 0926   K 4.4 10/07/2017 1436   CL 104 04/20/2020 0926   CO2 28 04/20/2020 0926   CO2 26 10/07/2017 1436   BUN 17 04/20/2020 0926   BUN 14.9 10/07/2017 1436   CREATININE 1.04 (H) 04/20/2020 0926   CREATININE 1.0 10/07/2017 1436       Component Value Date/Time   CALCIUM 9.6 04/20/2020 0926   CALCIUM 9.5 10/07/2017 1436   ALKPHOS 82 04/20/2020 0926   ALKPHOS 81 10/07/2017 1436   AST 13 (L) 04/20/2020 0926   AST 15 10/07/2017 1436   ALT 7 04/20/2020 0926   ALT 7 10/07/2017 1436   BILITOT 0.4 04/20/2020 0926   BILITOT 0.54 10/07/2017 1436       RADIOGRAPHIC STUDIES: I have personally reviewed the radiological images as listed and agreed with the findings in the report. CT ABDOMEN PELVIS W CONTRAST  Result Date: 04/19/2020 CLINICAL DATA:  Ovarian cancer, assess treatment response EXAM: CT ABDOMEN AND PELVIS WITH CONTRAST TECHNIQUE: Multidetector CT imaging of the abdomen and pelvis was performed using the standard protocol following bolus administration of intravenous contrast. CONTRAST:  122m OMNIPAQUE IOHEXOL 300 MG/ML  SOLN COMPARISON:  11/29/2019, 08/25/2019 FINDINGS: Lower chest: No acute abnormality. Hepatobiliary: No solid liver abnormality is seen. No gallstones, gallbladder wall thickening, or biliary dilatation. Pancreas: Unremarkable. No pancreatic ductal dilatation or surrounding inflammatory changes. Spleen: Normal in size without significant abnormality. Adrenals/Urinary Tract: Adrenal glands are unremarkable. Kidneys are normal, without renal calculi, solid lesion, or hydronephrosis. Bladder is unremarkable. Stomach/Bowel: Stomach is within normal limits. Redemonstrated postoperative findings of right colon resection and reanastomosis no evidence of bowel wall thickening, distention, or inflammatory changes. Sigmoid diverticulosis. Soft tissue in the hysterectomy bed near the distal sigmoid colon and rectum may be slightly decreased in size, difficult to measure due to configuration but approximately 2.5 x 1.0 cm, previously 3.0 x 1.3 cm (series 2, image 60). Vascular/Lymphatic: Aortic atherosclerosis. Redemonstrated long segment occlusion of the left common, internal, and external iliac arteries. Status post  femoral-femoral bypass grafting with reconstitution of the left common femoral artery. No enlarged abdominal or pelvic lymph nodes. Reproductive: Status post hysterectomy and oophorectomy. Other: No abdominal wall hernia or abnormality. Continued slight interval decrease in size of a soft tissue nodule in the right paracolic gutter measuring approximately 1.2 x 0.7 cm, previously 1.4 x 1.0 cm (series 2, image 53). Musculoskeletal: No acute or significant osseous findings. IMPRESSION: 1. Continued slight interval decrease in size of a soft tissue nodule in the right paracolic gutter, consistent with treatment response. 2. Soft tissue in the hysterectomy bed near the distal sigmoid colon and rectum may be slightly decreased in size, difficult to measure due to configuration but apparently diminished in  size, also consistent with treatment response. 3. Status post hysterectomy and oophorectomy as well as right colon resection. 4. No evidence of new metastatic disease in the abdomen or pelvis. 5. Redemonstrated long segment occlusion of the left common, internal, and external iliac arteries. Status post femoral-femoral bypass grafting with reconstitution of the left common femoral artery. Aortic Atherosclerosis (ICD10-I70.0). Electronically Signed   By: Eddie Candle M.D.   On: 04/19/2020 16:11

## 2020-04-24 ENCOUNTER — Telehealth: Payer: Self-pay | Admitting: Hematology and Oncology

## 2020-04-24 NOTE — Telephone Encounter (Signed)
Scheduled appt per 5/28 sch message- mailed reminder letter with appt date and time

## 2020-05-03 DIAGNOSIS — Z23 Encounter for immunization: Secondary | ICD-10-CM | POA: Diagnosis not present

## 2020-05-07 ENCOUNTER — Other Ambulatory Visit: Payer: Self-pay | Admitting: Hematology and Oncology

## 2020-05-07 ENCOUNTER — Telehealth: Payer: Self-pay

## 2020-05-07 MED ORDER — HYDROCODONE-ACETAMINOPHEN 5-325 MG PO TABS: 1.0000 | ORAL_TABLET | Freq: Four times a day (QID) | ORAL | 0 refills | Status: DC | PRN

## 2020-05-07 NOTE — Telephone Encounter (Signed)
done

## 2020-05-07 NOTE — Telephone Encounter (Signed)
Called and left below message that Rx sent.

## 2020-05-07 NOTE — Telephone Encounter (Signed)
She called to request a refill on Hydrocodone Rx.

## 2020-05-21 ENCOUNTER — Other Ambulatory Visit: Payer: Self-pay | Admitting: Hematology and Oncology

## 2020-05-21 MED FILL — LYNPARZA 100 MG TAB: 100 | 30 days supply | Qty: 120 | Fill #5

## 2020-06-04 ENCOUNTER — Other Ambulatory Visit: Payer: Self-pay | Admitting: Hematology and Oncology

## 2020-06-04 MED ORDER — HYDROCODONE-ACETAMINOPHEN 5-325 MG PO TABS: 1.0000 | ORAL_TABLET | Freq: Four times a day (QID) | ORAL | 0 refills | Status: DC | PRN

## 2020-06-14 ENCOUNTER — Other Ambulatory Visit: Payer: Self-pay

## 2020-06-14 ENCOUNTER — Encounter: Payer: Self-pay | Admitting: Hematology and Oncology

## 2020-06-14 ENCOUNTER — Inpatient Hospital Stay: Payer: Medicare Other

## 2020-06-14 ENCOUNTER — Inpatient Hospital Stay: Payer: Medicare Other | Attending: Hematology | Admitting: Hematology and Oncology

## 2020-06-14 DIAGNOSIS — Z90710 Acquired absence of both cervix and uterus: Secondary | ICD-10-CM | POA: Diagnosis not present

## 2020-06-14 DIAGNOSIS — G893 Neoplasm related pain (acute) (chronic): Secondary | ICD-10-CM

## 2020-06-14 DIAGNOSIS — G609 Hereditary and idiopathic neuropathy, unspecified: Secondary | ICD-10-CM | POA: Insufficient documentation

## 2020-06-14 DIAGNOSIS — D61818 Other pancytopenia: Secondary | ICD-10-CM | POA: Diagnosis not present

## 2020-06-14 DIAGNOSIS — C182 Malignant neoplasm of ascending colon: Secondary | ICD-10-CM | POA: Diagnosis not present

## 2020-06-14 DIAGNOSIS — Z85038 Personal history of other malignant neoplasm of large intestine: Secondary | ICD-10-CM | POA: Insufficient documentation

## 2020-06-14 DIAGNOSIS — Z9221 Personal history of antineoplastic chemotherapy: Secondary | ICD-10-CM | POA: Diagnosis not present

## 2020-06-14 DIAGNOSIS — C562 Malignant neoplasm of left ovary: Secondary | ICD-10-CM

## 2020-06-14 DIAGNOSIS — C786 Secondary malignant neoplasm of retroperitoneum and peritoneum: Secondary | ICD-10-CM | POA: Diagnosis not present

## 2020-06-14 DIAGNOSIS — J439 Emphysema, unspecified: Secondary | ICD-10-CM | POA: Insufficient documentation

## 2020-06-14 DIAGNOSIS — Z79899 Other long term (current) drug therapy: Secondary | ICD-10-CM | POA: Insufficient documentation

## 2020-06-14 LAB — CMP (CANCER CENTER ONLY)
ALT: 8 U/L (ref 0–44)
AST: 16 U/L (ref 15–41)
Albumin: 4.2 g/dL (ref 3.5–5.0)
Alkaline Phosphatase: 71 U/L (ref 38–126)
Anion gap: 9 (ref 5–15)
BUN: 18 mg/dL (ref 6–20)
CO2: 26 mmol/L (ref 22–32)
Calcium: 10.1 mg/dL (ref 8.9–10.3)
Chloride: 105 mmol/L (ref 98–111)
Creatinine: 1.06 mg/dL — ABNORMAL HIGH (ref 0.44–1.00)
GFR, Est AFR Am: >60 mL/min (ref >60)
GFR, Estimated: 57 mL/min — ABNORMAL LOW (ref >60)
Glucose, Bld: 92 mg/dL (ref 70–99)
Potassium: 3.9 mmol/L (ref 3.5–5.1)
Sodium: 140 mmol/L (ref 135–145)
Total Bilirubin: 0.5 mg/dL (ref 0.3–1.2)
Total Protein: 7.2 g/dL (ref 6.5–8.1)

## 2020-06-14 LAB — CBC WITH DIFFERENTIAL (CANCER CENTER ONLY)
Abs Immature Granulocytes: 0.01 10*3/uL (ref 0.00–0.07)
Basophils Absolute: 0 10*3/uL (ref 0.0–0.1)
Basophils Relative: 0 %
Eosinophils Absolute: 0.1 10*3/uL (ref 0.0–0.5)
Eosinophils Relative: 1 %
HCT: 34.8 % — ABNORMAL LOW (ref 36.0–46.0)
Hemoglobin: 12.4 g/dL (ref 12.0–15.0)
Immature Granulocytes: 0 %
Lymphocytes Relative: 59 %
Lymphs Abs: 3.5 10*3/uL (ref 0.7–4.0)
MCH: 38.2 pg — ABNORMAL HIGH (ref 26.0–34.0)
MCHC: 35.6 g/dL (ref 30.0–36.0)
MCV: 107.1 fL — ABNORMAL HIGH (ref 80.0–100.0)
Monocytes Absolute: 0.4 10*3/uL (ref 0.1–1.0)
Monocytes Relative: 7 %
Neutro Abs: 2 10*3/uL (ref 1.7–7.7)
Neutrophils Relative %: 33 %
Platelet Count: 110 10*3/uL — ABNORMAL LOW (ref 150–400)
RBC: 3.25 MIL/uL — ABNORMAL LOW (ref 3.87–5.11)
RDW: 14.1 % (ref 11.5–15.5)
WBC Count: 6 10*3/uL (ref 4.0–10.5)
nRBC: 0 % (ref 0.0–0.2)

## 2020-06-14 LAB — CEA (IN HOUSE-CHCC): CEA (CHCC-In House): 1.13 ng/mL (ref 0.00–5.00)

## 2020-06-14 NOTE — Assessment & Plan Note (Signed)
Her pancytopenia is stable/improved We will continue current treatment without dose adjustment She is not symptomatic

## 2020-06-14 NOTE — Assessment & Plan Note (Signed)
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

## 2020-06-14 NOTE — Assessment & Plan Note (Signed)
CEA is within normal limits She has no signs of cancer recurrence on imaging study

## 2020-06-14 NOTE — Progress Notes (Signed)
[facility] OFFICE PROGRESS NOTE  Patient Care Team: Marrian Salvage, [place] as PCP - General (Internal Medicine) Truitt Merle, MD as Consulting Physician (Hematology) Judeth Horn, MD as Consulting Physician (General Surgery) Nat Math, MD as Referring Physician (Obstetrics and Gynecology) Elam Dutch, MD as Consulting Physician (Vascular Surgery) [name], MD as Attending Physician (Gynecologic Oncology) Heath Lark, MD as Consulting Physician (Hematology and Oncology)  ASSESSMENT & PLAN:  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 [facility]) Her blood counts are stable since  CT imaging show excellent response to therapy She will continue current dose without dose adjustment I will continue to see her every 2 months for further follow-up I plan to repeat imaging study again in a few months, probably due around end of the year  Cancer of right colon [facility]) CEA is within normal limits She has no signs of cancer recurrence on imaging study  Pancytopenia, acquired [facility]) Her pancytopenia is stable/improved We will continue current treatment without dose adjustment She is not symptomatic  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   All questions were answered. The patient knows to call the clinic with any problems, questions or concerns. The total time spent in the appointment was 20 minutes encounter with patients including review of chart and various tests results, discussions about plan of care and coordination of care plan   Heath Lark, MD 06/14/2020 11:11 AM  INTERVAL HISTORY: Please see below for problem oriented charting. She returns for further follow-up She is doing well She denies side effects from treatment so far No recent changes in bowel habits Denies abdominal pain, nausea or  diarrhea Her chronic neuropathic pain is stable  SUMMARY OF ONCOLOGIC HISTORY: Oncology History Overview Note  BRCA1 positive   Cancer of right colon ([place])  09/16/2014 Imaging   CT abd/pel:  a 3.8cm cecal mass with associated ileocecal intussusception, and a 9.1cm solid and cystic mass in the left pelvis,   09/18/2014 Surgery   right hemicolectomy and terminal ileoectomy   09/18/2014 Pathologic Stage   pT3pN1Mx, tumor extends into pericolonic soft tissue and is less than 38m from the serosal surface, LVI 8-), PNI (-), all 23 node negative, one soft tissue tumor deposit, surgical margins negative.    09/18/2014 Initial Diagnosis   Adenocarcinoma of right colon   08/06/2016 Imaging   CT chest/abd/pelvis with contrast IMPRESSION: 1. No recurrent malignancy identified. 2. 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 On the prior exam from 09/13/2015 there was back flow of contrast in the left external iliac artery to supply the left internal iliac artery ; this back flow of contrast is no longer present in the left external iliac artery is occluded. 3. Several pulmonary nodules at or below 4 mm diameter, unchanged. 4. 3 mm hypodensity posteriorly in the body of the pancreas, not well seen previously and subtle today, likely incidental, but merits observation. 5. 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 Prominent degenerative spurring of both acetabula. 6. Lumbar spondylosis and degenerative disc disease causing prominent impingement at L4-5 and mild impingement at L5-S1.   03/08/2017 Imaging   CT CAP W Contrast 03/08/17 IMPRESSION: Chest Impression: 1. Stable small pulmonary nodules within LEFT lung. 2. No mediastinal lymphadenopathy.  Abdomen / Pelvis Impression: 1. No evidence  of metastatic colorectal cancer or ovarian cancer within the abdomen pelvis. 2. No lymphadenopathy. 3. Postsurgical  change consistent RIGHT hemicolectomy and hysterectomy 4. A fem-fem arterial bypass noted.   11/13/2017 Imaging   CT AP w contrast IMPRESSION: 1. No findings of recurrent malignancy in the abdomen or pelvis. 2. Other imaging findings of potential clinical significance:Chronic AVN of both femoral heads without contour abnormality. Lumbar impingement at L4-5 and L5-S1. Aortic Atherosclerosis (ICD10-I70.0). 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 Partial right hemicolectomy. Hysterectomy.   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 This was discovered during her workup for colon cancer.   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 Tumor size 3.7 cm, no lymph nodes removed. T1cNx. Tumor cells are positive for cytokeratin 7 and estrogen receptor.   11/01/2014 Surgery   B/l Salpingo-oophorectomy.   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 Stable small pulmonary nodules within LEFT lung. 2. No mediastinal lymphadenopathy. Abdomen / Pelvis Impression: 1. No evidence of metastatic colorectal cancer or ovarian cancer within the abdomen pelvis. 2. No lymphadenopathy. 3. Postsurgical change consistent RIGHT hemicolectomy and hysterectomy 4. A fem-fem arterial bypass noted.   03/12/2017 Mammogram   MM Right Breast 03/12/17 IMPRESSION: No mammographic or sonographic evidence of malignancy. No correlate identified for the abnormal enhancement  seen within the right nipple on MRI of 02/27/2017.    04/02/2017 Pathology Results   Surgical Pathology of Nipple Biopsy: 04/02/17 Diagnosis Nipple Biopsy, Right - BENIGN NIPPLE TISSUE. - NO ATYPIA OR MALIGNANCY.    11/13/2017 Imaging   1. No findings of recurrent malignancy in the abdomen or pelvis. 2. Other imaging findings of potential clinical significance: Chronic AVN of both femoral heads without contour abnormality. Lumbar impingement at L4-5 and L5-S1. Aortic Atherosclerosis (ICD10-I70.0). 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 Partial right hemicolectomy. Hysterectomy.   03/22/2018 Mammogram   Screening mammogram IMPRESSION No evidence of malignancy.     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   06/06/2019 Imaging   1. Tiny bilateral pulmonary nodules stable since 11/12/2018. Continued close attention on follow-up recommended. 2. Incompletely visualized upper abdominal lymphadenopathy better characterized on the abdomen CT from 2 weeks ago. 3.  Aortic Atherosclerois (ICD10-170.0) 4.  Emphysema. (AOZ30-Q65.9)   06/07/2019 Procedure   Technically successful CT guided core needle biopsy of indeterminate peritoneal nodule within the right lower abdomen/pelvis.   06/07/2019 Pathology Results   Soft Tissue Needle Core Biopsy, peritoneal nodule within right lower abdomen - METASTATIC CARCINOMA CONSISTENT WITH PATIENT'S CLINICAL HISTORY OF PRIMARY OVARIAN CARCINOMA. SEE NOTE Diagnosis Note Immunohistochemical stains show that the tumor cells are positive for CK7, ER and PAX 8; and negative for  CK20 and CDX2. This immunostaining profile is consistent with above diagnosis.   06/16/2019  Tumor Marker   Patient's tumor was tested for the following markers: CA-125 Results of the tumor marker test revealed 499   06/20/2019 - 11/14/2019 Chemotherapy   The patient had carboplatin and taxol   07/11/2019 Tumor Marker   Patient's tumor was tested for the following markers: CA-125 Results of the tumor marker test revealed 438   08/05/2019 Tumor Marker   Patient's tumor was tested for the following markers: CA-125 Results of the tumor marker test revealed 272.   08/25/2019 Imaging   Ct chest, abdomen and pelvis 1. Slight interval decrease in size of a right lower quadrant peritoneal nodule measuring 1.7 x 1.6 cm, previously 2.2 x 2.2 cm (series 2, image 94, series 5, image 58). Findings are consistent with treatment response.   2. No significant change in ill-defined soft tissue about the sigmoid colon in the hysterectomy bed (series 2, image 101).   3.  Status post hysterectomy.   4. No evidence of metastatic disease in the chest. Stable benign small pulmonary nodules.   5.  Coronary artery disease.  Aortic Atherosclerosis (ICD10-I70.0).     08/26/2019 Tumor Marker   Patient's tumor was tested for the following markers: CA-125 Results of the tumor marker test revealed 189.   09/26/2019 Tumor Marker   Patient's tumor was tested for the following markers: CA-125 Results of the tumor marker test revealed 137   10/03/2019 Tumor Marker   Patient's tumor was tested for the following markers: CA-125 Results of the tumor marker test revealed 137.   10/31/2019 Tumor Marker   Patient's tumor was tested for the following markers: CA-125 Results of the tumor marker test revealed 101   11/29/2019 Imaging   1. Right lower quadrant peritoneal implant is decreased. Stable irregular nodular pelvic peritoneal thickening superior to the vaginal cuff. 2. No new or progressive metastatic disease. No abdominopelvic lymphadenopathy. 3.  Aortic Atherosclerosis (ICD10-I70.0).   12/15/2019 -   Chemotherapy   The patient had Lynparza for chemotherapy treatment.     12/23/2019 Tumor Marker   Patient's tumor was tested for the following markers: CA-125 Results of the tumor marker test revealed 55.9   01/02/2020 Tumor Marker   Patient's tumor was tested for the following markers: CA-125. Results of the tumor marker test revealed 58.6   02/03/2020 Tumor Marker   Patient's tumor was tested for the following markers: CA-125 Results of the tumor marker test revealed 52.2   02/24/2020 Tumor Marker   Patient's tumor was tested for the following markers: CA-125 Results of the tumor marker test revealed 64.8   04/19/2020 Imaging   1. Continued slight interval decrease in size of a soft tissue nodule in the right paracolic gutter, consistent with treatment response. 2. Soft tissue in the hysterectomy bed near the distal sigmoid colon and rectum may be slightly decreased in size, difficult to measure due to configuration but apparently diminished in size, also consistent with treatment response. 3. Status post hysterectomy and oophorectomy as well as right colon resection. 4. No evidence of new metastatic disease in the abdomen or pelvis. 5. Redemonstrated long segment occlusion of the left common, internal, and external iliac arteries. Status post femoral-femoral bypass grafting with reconstitution of the left common femoral artery. Aortic Atherosclerosis (ICD10-I70.0).     REVIEW OF SYSTEMS:   Constitutional: Denies fevers, chills or abnormal weight loss Eyes: Denies blurriness of vision Ears, nose, mouth, throat, and face: Denies  mucositis or sore throat Respiratory: Denies cough, dyspnea or wheezes Cardiovascular: Denies palpitation, chest discomfort or lower extremity swelling Gastrointestinal:  Denies nausea, heartburn or change in bowel habits Skin: Denies abnormal skin rashes Lymphatics: Denies new lymphadenopathy or easy bruising Neurological:Denies numbness, tingling or new  weaknesses Behavioral/Psych: Mood is stable, no new changes  All other systems were reviewed with the patient and are negative.  I have reviewed the past medical history, past surgical history, social history and family history with the patient and they are unchanged from previous note.  ALLERGIES:  is allergic to other, demerol [meperidine], morphine and related, oxycodone, penicillins, strawberry flavor, tylenol [acetaminophen], and ibuprofen.  MEDICATIONS:  Current Outpatient Medications  Medication Sig Dispense Refill  . acetaminophen (TYLENOL) 325 MG tablet Take 325 mg by mouth as needed.    . docusate sodium 100 MG CAPS Take 100 mg by mouth 2 (two) times daily as needed for mild constipation. 10 capsule 0  . HYDROcodone-acetaminophen (NORCO/VICODIN) 5-325 MG tablet Take 1 tablet by mouth every 6 (six) hours as needed for moderate pain. 60 tablet 0  . lidocaine (LIDODERM) 5 % PLACE 1 PATCH ONTO THE SKIN EVERY DAY.REMOVE AND DISCARD PATCH WITHIN 12 HOURS OR AS DIRECTED BY PRESCRIBER 30 patch 1  . LORazepam (ATIVAN) 0.5 MG tablet Take 1 tablet (0.5 mg total) by mouth 3 (three) times daily as needed for anxiety. 10 tablet 0  . losartan (COZAAR) 50 MG tablet Take 1 tablet (50 mg total) by mouth daily. 90 tablet 2  . olaparib (LYNPARZA) 100 MG tablet Take 2 tablets (200 mg total) by mouth 2 (two) times daily. Swallow whole. May take with food to decrease nausea and vomiting. 120 tablet 11  . ondansetron (ZOFRAN) 8 MG tablet TAKE 1 TABLET(8 MG) BY MOUTH EVERY 8 HOURS AS NEEDED FOR NAUSEA OR VOMITING 90 tablet 3  . pregabalin (LYRICA) 200 MG capsule Take 1 capsule (200 mg total) by mouth every 8 (eight) hours. Please give patient brand name Lyrica 90 capsule 5  . prochlorperazine (COMPAZINE) 10 MG tablet TAKE 1 TABLET(10 MG) BY MOUTH EVERY 6 HOURS AS NEEDED FOR NAUSEA OR VOMITING 90 tablet 1   No current facility-administered medications for this visit.    PHYSICAL EXAMINATION: ECOG  PERFORMANCE STATUS: 1 - Symptomatic but completely ambulatory  Vitals:   06/14/20 1016  BP: (!) 130/93  Pulse: 64  Resp: 18  Temp: 98.3 F (36.8 C)  SpO2: 100%   Filed Weights   06/14/20 1016  Weight: 155 lb 6.4 oz (70.5 kg)    GENERAL:alert, no distress and comfortable SKIN: skin color, texture, turgor are normal, no rashes or significant lesions EYES: normal, Conjunctiva are pink and non-injected, sclera clear OROPHARYNX:no exudate, no erythema and lips, buccal mucosa, and tongue normal  NECK: supple, thyroid normal size, non-tender, without nodularity LYMPH:  no palpable lymphadenopathy in the cervical, axillary or inguinal LUNGS: clear to auscultation and percussion with normal breathing effort HEART: regular rate & rhythm and no murmurs and no lower extremity edema ABDOMEN:abdomen soft, non-tender and normal bowel sounds Musculoskeletal:no cyanosis of digits and no clubbing  NEURO: alert & oriented x 3 with fluent speech, no focal motor/sensory deficits  LABORATORY DATA:  I have reviewed the data as listed    Component Value Date/Time   NA 140 06/14/2020 1005   NA 141 10/07/2017 1436   K 3.9 06/14/2020 1005   K 4.4 10/07/2017 1436   CL 105 06/14/2020 1005   CO2 26 06/14/2020  1005   CO2 26 10/07/2017 1436   GLUCOSE 92 06/14/2020 1005   GLUCOSE 85 10/07/2017 1436   BUN 18 06/14/2020 1005   BUN 14.9 10/07/2017 1436   CREATININE 1.06 (H) 06/14/2020 1005   CREATININE 1.0 10/07/2017 1436   CALCIUM 10.1 06/14/2020 1005   CALCIUM 9.5 10/07/2017 1436   PROT 7.2 06/14/2020 1005   PROT 7.8 10/07/2017 1436   ALBUMIN 4.2 06/14/2020 1005   ALBUMIN 3.9 10/07/2017 1436   AST 16 06/14/2020 1005   AST 15 10/07/2017 1436   ALT 8 06/14/2020 1005   ALT 7 10/07/2017 1436   ALKPHOS 71 06/14/2020 1005   ALKPHOS 81 10/07/2017 1436   BILITOT 0.5 06/14/2020 1005   BILITOT 0.54 10/07/2017 1436   GFRNONAA 57 (L) 06/14/2020 1005   GFRAA >60 06/14/2020 1005    No results found  for: SPEP, UPEP  Lab Results  Component Value Date   WBC 6.0 06/14/2020   NEUTROABS 2.0 06/14/2020   HGB 12.4 06/14/2020   HCT 34.8 (L) 06/14/2020   MCV 107.1 (H) 06/14/2020   PLT 110 (L) 06/14/2020      Chemistry      Component Value Date/Time   NA 140 06/14/2020 1005   NA 141 10/07/2017 1436   K 3.9 06/14/2020 1005   K 4.4 10/07/2017 1436   CL 105 06/14/2020 1005   CO2 26 06/14/2020 1005   CO2 26 10/07/2017 1436   BUN 18 06/14/2020 1005   BUN 14.9 10/07/2017 1436   CREATININE 1.06 (H) 06/14/2020 1005   CREATININE 1.0 10/07/2017 1436      Component Value Date/Time   CALCIUM 10.1 06/14/2020 1005   CALCIUM 9.5 10/07/2017 1436   ALKPHOS 71 06/14/2020 1005   ALKPHOS 81 10/07/2017 1436   AST 16 06/14/2020 1005   AST 15 10/07/2017 1436   ALT 8 06/14/2020 1005   ALT 7 10/07/2017 1436   BILITOT 0.5 06/14/2020 1005   BILITOT 0.54 10/07/2017 1436

## 2020-06-14 NOTE — Assessment & Plan Note (Addendum)
Her blood counts are stable since  CT imaging show excellent response to therapy She will continue current dose without dose adjustment I will continue to see her every 2 months for further follow-up I plan to repeat imaging study again in a few months, probably due around end of the year

## 2020-06-18 MED FILL — LYNPARZA 100 MG TAB: 100 | 30 days supply | Qty: 120 | Fill #6

## 2020-06-22 ENCOUNTER — Other Ambulatory Visit: Payer: Self-pay | Admitting: *Deleted

## 2020-06-22 ENCOUNTER — Telehealth: Payer: Self-pay

## 2020-06-22 ENCOUNTER — Other Ambulatory Visit: Payer: Self-pay

## 2020-06-22 DIAGNOSIS — I739 Peripheral vascular disease, unspecified: Secondary | ICD-10-CM

## 2020-06-22 MED ORDER — HYDROCODONE-ACETAMINOPHEN 5-325 MG PO TABS: 1.0000 | ORAL_TABLET | Freq: Four times a day (QID) | ORAL | 0 refills | Status: DC | PRN

## 2020-06-22 NOTE — Telephone Encounter (Signed)
Called pt to inform that rx for Norco filled by Dr Marin Olp. Pt verbalized thanks and understanding.

## 2020-07-02 ENCOUNTER — Ambulatory Visit: Payer: Medicare Other | Admitting: Family

## 2020-07-05 ENCOUNTER — Encounter (HOSPITAL_COMMUNITY): Payer: Medicare Other

## 2020-07-05 ENCOUNTER — Ambulatory Visit: Payer: Medicare Other

## 2020-07-13 ENCOUNTER — Encounter: Payer: Self-pay | Admitting: Family

## 2020-07-13 ENCOUNTER — Other Ambulatory Visit: Payer: Self-pay

## 2020-07-13 ENCOUNTER — Ambulatory Visit (INDEPENDENT_AMBULATORY_CARE_PROVIDER_SITE_OTHER): Payer: Medicare Other | Admitting: Family

## 2020-07-13 VITALS — BP 118/60 | HR 60 | Temp 98.3°F | Ht 67.0 in | Wt 162.1 lb

## 2020-07-13 DIAGNOSIS — I10 Essential (primary) hypertension: Secondary | ICD-10-CM

## 2020-07-13 DIAGNOSIS — C182 Malignant neoplasm of ascending colon: Secondary | ICD-10-CM | POA: Diagnosis not present

## 2020-07-13 DIAGNOSIS — J4520 Mild intermittent asthma, uncomplicated: Secondary | ICD-10-CM

## 2020-07-13 DIAGNOSIS — G609 Hereditary and idiopathic neuropathy, unspecified: Secondary | ICD-10-CM | POA: Diagnosis not present

## 2020-07-13 DIAGNOSIS — C562 Malignant neoplasm of left ovary: Secondary | ICD-10-CM | POA: Diagnosis not present

## 2020-07-13 MED ORDER — ALBUTEROL SULFATE (2.5 MG/3ML) 0.083% IN NEBU: 2.5000 mg | INHALATION_SOLUTION | Freq: Four times a day (QID) | RESPIRATORY_TRACT | 1 refills | Status: DC | PRN

## 2020-07-13 MED ORDER — LOSARTAN POTASSIUM 50 MG PO TABS: 50.0000 mg | ORAL_TABLET | Freq: Every day | ORAL | 3 refills | Status: DC

## 2020-07-13 NOTE — Progress Notes (Signed)
Catherine Rogers is a 59 y.o. female with the following history as recorded in Epic[employer]:  Patient Active Problem List   Diagnosis Date Noted  . Physical debility 08/26/2019  . Pancytopenia, acquired ([place]) 07/12/2019  . Other constipation 07/12/2019  . Goals of care, counseling/discussion 06/09/2019  . Left leg pain 06/08/2019  . Nausea without vomiting 09/17/2017  . Neuropathy, idiopathic 09/17/2017  . Chronic hepatitis C without hepatic coma ([place]) 12/31/2016  . Routine general medical examination at a health care facility 09/04/2016  . Vitamin D deficiency 09/04/2016  . Medicare annual wellness visit, subsequent 09/04/2016  . Type II CRPS (complex regional pain syndrome) 10/22/2015  . Numbness of left foot 07/05/2015  . BRCA1 genetic carrier 05/12/2015  . PAD (peripheral artery disease) ([place]) 04/18/2015  . Genetic testing 04/06/2015  . Family history of ovarian cancer   . Lumbosacral plexopathy 01/29/2015  . Left leg numbness 01/05/2015  . Postoperative anemia due to acute blood loss 12/28/2014  . Hypertension 12/22/2014  . Anxiety 12/22/2014  . Cancer associated pain 12/22/2014  . 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 ([place]) 11/28/2014  . Cancer of right colon ([place]) 10/11/2014  . Intussusception, ileocecal ([place]) 09/18/2014  . Mass of pelvis 09/18/2014  . Alcohol use 09/16/2014    Current Outpatient Medications  Medication Sig Dispense Refill  . acetaminophen (TYLENOL) 325 MG tablet Take 325 mg by mouth as needed.    . docusate sodium 100 MG CAPS Take 100 mg by mouth 2 (two) times daily as needed for mild constipation. 10 capsule 0  . HYDROcodone-acetaminophen (NORCO/VICODIN) 5-325 MG tablet Take 1 tablet by mouth every 6 (six) hours as needed for moderate pain. 60 tablet 0  . lidocaine (LIDODERM) 5 % PLACE 1 PATCH ONTO THE SKIN EVERY DAY.REMOVE AND DISCARD PATCH WITHIN 12 HOURS OR AS DIRECTED BY PRESCRIBER 30 patch 1  . LORazepam (ATIVAN) 0.5 MG tablet Take 1 tablet (0.5 mg total) by mouth 3  (three) times daily as needed for anxiety. 10 tablet 0  . losartan (COZAAR) 50 MG tablet Take 1 tablet (50 mg total) by mouth daily. 90 tablet 3  . olaparib (LYNPARZA) 100 MG tablet Take 2 tablets (200 mg total) by mouth 2 (two) times daily. Swallow whole. May take with food to decrease nausea and vomiting. 120 tablet 11  . ondansetron (ZOFRAN) 8 MG tablet TAKE 1 TABLET(8 MG) BY MOUTH EVERY 8 HOURS AS NEEDED FOR NAUSEA OR VOMITING 90 tablet 3  . pregabalin (LYRICA) 200 MG capsule Take 1 capsule (200 mg total) by mouth every 8 (eight) hours. Please give patient brand name Lyrica 90 capsule 5  . prochlorperazine (COMPAZINE) 10 MG tablet TAKE 1 TABLET(10 MG) BY MOUTH EVERY 6 HOURS AS NEEDED FOR NAUSEA OR VOMITING 90 tablet 1  . albuterol (PROVENTIL) (2.5 MG/3ML) 0.083% nebulizer solution Take 3 mLs (2.5 mg total) by nebulization every 6 (six) hours as needed for wheezing or shortness of breath. 150 mL 1   No current facility-administered medications for this visit.    Allergies: Other, Demerol [meperidine], Morphine and related, Oxycodone, Penicillins, Strawberry flavor, Tylenol [acetaminophen], and Ibuprofen  Past Medical History:  Diagnosis Date  . Anemia   . Anxiety   . Arthritis   . Asthma   . Cancer ([place]) dx'd 08/2014   colon and ovarian  . Complication of anesthesia    woke and saw long tube coming out of mouth . sat up then went back to sleep  . Depression   . Ectopic pregnancy   .  Family history of adverse reaction to anesthesia    sister on dialysis- hypotension   . Family history of ovarian cancer   . Head injury, closed, with brief LOC ([place])    had a cut- as a child  . Headache   . Hepatitis    being treated for Hep C  . History of blood transfusion    2005 approximately   . Hypertension    no meds now  . PAD (peripheral artery disease) ([place]) 04/18/2015  . Pelvic cyst   . PONV (postoperative nausea and vomiting)   . PTSD (post-traumatic stress disorder)     Past  Surgical History:  Procedure Laterality Date  . ABDOMINAL HYSTERECTOMY    . BREAST BIOPSY Right 04/02/2017   Procedure: INCISIONAL BIOPSY RIGHT NIPPLE;  Surgeon: Coralie Keens, MD;  Location: [facility];  Service: General;  Laterality: Right;  . COLONOSCOPY    . ESOPHAGOGASTRODUODENOSCOPY N/A 09/17/2014   Procedure: ESOPHAGOGASTRODUODENOSCOPY (EGD);  Surgeon: Jeryl Columbia, MD;  Location: [facility];  Service: Endoscopy;  Laterality: N/A;  . FEMORAL-FEMORAL BYPASS GRAFT Bilateral 04/18/2015   Procedure:  RIGHT COMMON FEMORAL TO LEFT COMMON  FEMORAL  ARTERY BYPASS WITH  8 MM HEMASHIELD GRAFT,RIGHT COMMON SUPERFICIAL FEMORAL ARTERY ENDARTERECTOMY;  Surgeon: Elam Dutch, MD;  Location: [facility];  Service: Vascular;  Laterality: Bilateral;  . ILEOSTOMY Right 09/18/2014   Procedure: ILEOCOLOSTOMY;  Surgeon: Doreen Salvage, MD;  Location: [facility];  Service: General;  Laterality: Right;  . LAPAROTOMY N/A 11/01/2014   Procedure: EXPLORATORY LAPAROTOMY;  Surgeon: Doreen Salvage, MD;  Location: [facility];  Service: General;  Laterality: N/A;  . LAPAROTOMY N/A 12/26/2014   Procedure:  LAPAROTOMY ;  Surgeon: Everitt Amber, MD;  Location: [facility];  Service: Gynecology;  Laterality: N/A;  . MASS EXCISION  11/01/2014   Procedure: EXCISION PELVIC MASS;  Surgeon: Doreen Salvage, MD;  Location: [facility];  Service: General;;  . OOPHORECTOMY  2016  . PARTIAL COLECTOMY Right 09/18/2014   Procedure: RIGHT HEMI COLECTOMY;  Surgeon: Doreen Salvage, MD;  Location: [facility];  Service: General;  Laterality: Right;  . ROBOTIC ASSISTED TOTAL HYSTERECTOMY WITH BILATERAL SALPINGO OOPHERECTOMY Right 12/26/2014   Procedure: EXPLORATORY LAPAROSCOPY  RIGHT SALPINGO OOPHORECTOMY OMENTECTOMY LYMPHADECTOMY ;  Surgeon: Everitt Amber, MD;  Location: [facility];  Service: Gynecology;  Laterality: Right;  . TUBAL LIGATION      Family History  Problem Relation Age of Onset  . Brain cancer Sister 90  . Lung cancer Brother 20  . Heart attack Mother   . Heart  attack Father        49s  . Heart disease Father   . Heart attack Maternal Uncle   . Diabetes Maternal Uncle   . Breast cancer Maternal Uncle   . Ovarian cancer Paternal Aunt        3 paternal aunts with ovarian cancer  . Heart attack Brother   . Diabetes Paternal Uncle     Social History   Tobacco Use  . Smoking status: Current Some Day Smoker    Years: 10.00    Types: Cigars  . Smokeless tobacco: Never Used  . Tobacco comment: pack per month of cigars = 5  Substance Use Topics  . Alcohol use: No    Subjective:  1 year follow-up on hypertension; continuing to see oncology regularly; also under care of neurology and vascular. Has completed chemotherapy for metastatic cancer; now on oral medication; oncology is monitoring ovarian/ colon cancer; seeing  them every 2 months; Requesting refill on albuterol to use with her nebulizer- may need to use once per year;  Also requesting shower chair- worried about potential of falling with her neuropathy;   Objective:  Vitals:   07/13/20 1012  BP: 118/60  Pulse: 60  Temp: 98.3 F (36.8 C)  TempSrc: Oral  SpO2: 99%  Weight: 162 lb 2 oz (73.5 kg)  Height: _0  (1.702 m)    General: Well developed, well nourished, in no acute distress  Skin : Warm and dry.  Head: Normocephalic and atraumatic  Lungs: Respirations unlabored; clear to auscultation bilaterally without wheeze, rales, rhonchi  CVS exam: normal rate and regular rhythm.  Neurologic: Alert and oriented; speech intact; face symmetrical; moves all extremities well; CNII-XII intact without focal deficit   Assessment:  1. Essential hypertension   2. Neuropathy, idiopathic   3. 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 ([place])   4. Cancer of right colon ([place])   5. Mild intermittent asthma without complication     Plan:  1. Stable; refill updated; labs are being managed by oncology; 2. Continue with neurology; order for shower chair as requested; 3. Continue with oncology as scheduled; 4.  Continue with oncology as scheduled; 5. Refill on albuterol for nebulizer to use as needed;   This visit occurred during the SARS-CoV-2 public health emergency.  Safety protocols were in place, including screening questions prior to the visit, additional usage of staff PPE, and extensive cleaning of exam room while observing appropriate contact time as indicated for disinfecting solutions.     No follow-ups on file.  Orders Placed This Encounter  Procedures  . For home use only DME Other see comment    Shower chair    Order Specific Question:   Length of Need    Answer:   Lifetime    Requested Prescriptions   Signed Prescriptions Disp Refills  . losartan (COZAAR) 50 MG tablet 90 tablet 3    Sig: Take 1 tablet (50 mg total) by mouth daily.  [name] albuterol (PROVENTIL) (2.5 MG/3ML) 0.083% nebulizer solution 150 mL 1    Sig: Take 3 mLs (2.5 mg total) by nebulization every 6 (six) hours as needed for wheezing or shortness of breath.

## 2020-07-23 ENCOUNTER — Other Ambulatory Visit: Payer: Self-pay | Admitting: Hematology and Oncology

## 2020-07-23 ENCOUNTER — Telehealth: Payer: Self-pay

## 2020-07-23 MED ORDER — HYDROCODONE-ACETAMINOPHEN 5-325 MG PO TABS
1.0000 | ORAL_TABLET | Freq: Four times a day (QID) | ORAL | 0 refills | Status: DC | PRN
Start: 2020-07-23 — End: 2020-08-15

## 2020-07-23 NOTE — Telephone Encounter (Signed)
done

## 2020-07-23 NOTE — Telephone Encounter (Signed)
Patient called requesting refill on hydrocodone.  Last refill 06/22/2020.

## 2020-07-24 MED FILL — LYNPARZA 100 MG TAB: 100 | 30 days supply | Qty: 120 | Fill #7

## 2020-08-14 ENCOUNTER — Other Ambulatory Visit: Payer: Self-pay | Admitting: Hematology and Oncology

## 2020-08-14 DIAGNOSIS — C562 Malignant neoplasm of left ovary: Secondary | ICD-10-CM

## 2020-08-15 ENCOUNTER — Encounter: Payer: Self-pay | Admitting: Hematology and Oncology

## 2020-08-15 ENCOUNTER — Inpatient Hospital Stay: Payer: Medicare Other

## 2020-08-15 ENCOUNTER — Inpatient Hospital Stay: Payer: Medicare Other | Attending: Hematology | Admitting: Hematology and Oncology

## 2020-08-15 ENCOUNTER — Other Ambulatory Visit: Payer: Self-pay

## 2020-08-15 VITALS — BP 121/75 | HR 72 | Temp 97.4°F | Resp 18 | Ht 67.0 in | Wt 157.6 lb

## 2020-08-15 DIAGNOSIS — C182 Malignant neoplasm of ascending colon: Secondary | ICD-10-CM

## 2020-08-15 DIAGNOSIS — Z90710 Acquired absence of both cervix and uterus: Secondary | ICD-10-CM | POA: Insufficient documentation

## 2020-08-15 DIAGNOSIS — Z1501 Genetic susceptibility to malignant neoplasm of breast: Secondary | ICD-10-CM

## 2020-08-15 DIAGNOSIS — Z85038 Personal history of other malignant neoplasm of large intestine: Secondary | ICD-10-CM | POA: Diagnosis not present

## 2020-08-15 DIAGNOSIS — C562 Malignant neoplasm of left ovary: Secondary | ICD-10-CM

## 2020-08-15 DIAGNOSIS — Z1509 Genetic susceptibility to other malignant neoplasm: Secondary | ICD-10-CM | POA: Diagnosis not present

## 2020-08-15 DIAGNOSIS — G893 Neoplasm related pain (acute) (chronic): Secondary | ICD-10-CM | POA: Insufficient documentation

## 2020-08-15 DIAGNOSIS — C786 Secondary malignant neoplasm of retroperitoneum and peritoneum: Secondary | ICD-10-CM | POA: Diagnosis not present

## 2020-08-15 DIAGNOSIS — Z79899 Other long term (current) drug therapy: Secondary | ICD-10-CM | POA: Insufficient documentation

## 2020-08-15 DIAGNOSIS — Z9221 Personal history of antineoplastic chemotherapy: Secondary | ICD-10-CM | POA: Diagnosis not present

## 2020-08-15 DIAGNOSIS — R110 Nausea: Secondary | ICD-10-CM | POA: Insufficient documentation

## 2020-08-15 DIAGNOSIS — J439 Emphysema, unspecified: Secondary | ICD-10-CM | POA: Diagnosis not present

## 2020-08-15 DIAGNOSIS — G609 Hereditary and idiopathic neuropathy, unspecified: Secondary | ICD-10-CM | POA: Insufficient documentation

## 2020-08-15 LAB — CBC WITH DIFFERENTIAL (CANCER CENTER ONLY)
Abs Immature Granulocytes: 0 10*3/uL (ref 0.00–0.07)
Basophils Absolute: 0 10*3/uL (ref 0.0–0.1)
Basophils Relative: 0 %
Eosinophils Absolute: 0.1 10*3/uL (ref 0.0–0.5)
Eosinophils Relative: 1 %
HCT: 33.7 % — ABNORMAL LOW (ref 36.0–46.0)
Hemoglobin: 12.2 g/dL (ref 12.0–15.0)
Immature Granulocytes: 0 %
Lymphocytes Relative: 48 %
Lymphs Abs: 2.6 10*3/uL (ref 0.7–4.0)
MCH: 40.3 pg — ABNORMAL HIGH (ref 26.0–34.0)
MCHC: 36.2 g/dL — ABNORMAL HIGH (ref 30.0–36.0)
MCV: 111.2 fL — ABNORMAL HIGH (ref 80.0–100.0)
Monocytes Absolute: 0.3 10*3/uL (ref 0.1–1.0)
Monocytes Relative: 6 %
Neutro Abs: 2.4 10*3/uL (ref 1.7–7.7)
Neutrophils Relative %: 45 %
Platelet Count: 124 10*3/uL — ABNORMAL LOW (ref 150–400)
RBC: 3.03 MIL/uL — ABNORMAL LOW (ref 3.87–5.11)
RDW: 14.4 % (ref 11.5–15.5)
WBC Count: 5.4 10*3/uL (ref 4.0–10.5)
nRBC: 0.4 % — ABNORMAL HIGH (ref 0.0–0.2)

## 2020-08-15 LAB — CEA (IN HOUSE-CHCC): CEA (CHCC-In House): 1.04 ng/mL (ref 0.00–5.00)

## 2020-08-15 LAB — CMP (CANCER CENTER ONLY)
ALT: 9 U/L (ref 0–44)
AST: 14 U/L — ABNORMAL LOW (ref 15–41)
Albumin: 4.1 g/dL (ref 3.5–5.0)
Alkaline Phosphatase: 74 U/L (ref 38–126)
Anion gap: 6 (ref 5–15)
BUN: 17 mg/dL (ref 6–20)
CO2: 31 mmol/L (ref 22–32)
Calcium: 9.2 mg/dL (ref 8.9–10.3)
Chloride: 106 mmol/L (ref 98–111)
Creatinine: 0.94 mg/dL (ref 0.44–1.00)
GFR, Est AFR Am: >60 mL/min (ref >60)
GFR, Estimated: >60 mL/min (ref >60)
Glucose, Bld: 71 mg/dL (ref 70–99)
Potassium: 3.8 mmol/L (ref 3.5–5.1)
Sodium: 143 mmol/L (ref 135–145)
Total Bilirubin: 0.4 mg/dL (ref 0.3–1.2)
Total Protein: 7.2 g/dL (ref 6.5–8.1)

## 2020-08-15 MED ORDER — HYDROCODONE-ACETAMINOPHEN 5-325 MG PO TABS: 1.0000 | ORAL_TABLET | Freq: Four times a day (QID) | ORAL | 0 refills | Status: DC | PRN

## 2020-08-15 NOTE — Progress Notes (Signed)
[facility] OFFICE PROGRESS NOTE  Patient Care Team: Marrian Salvage, [place] as PCP - General (Internal Medicine) Truitt Merle, MD as Consulting Physician (Hematology) Judeth Horn, MD as Consulting Physician (General Surgery) Nat Math, MD as Referring Physician (Obstetrics and Gynecology) Elam Dutch, MD as Consulting Physician (Vascular Surgery) [name], MD as Attending Physician (Gynecologic Oncology) Heath Lark, MD as Consulting Physician (Hematology and Oncology)  ASSESSMENT & PLAN:  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 [facility]) Her blood counts are stable  She will continue current dose without dose adjustment I will continue to see her every 2 months for further follow-up I plan to repeat imaging study again in November  Cancer of right colon [facility]) CEA is within normal limits She has no signs of cancer recurrence on imaging study  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 CT ABDOMEN PELVIS W CONTRAST    Standing Status:   Future    Standing Expiration Date:   08/15/2021    Order Specific Question:   If indicated for the ordered procedure, I authorize the administration of contrast media per Radiology protocol    Answer:   Yes    Order Specific Question:   Preferred imaging location?    Answer:   [facility]    Order Specific Question:   Radiology Contrast Protocol - do NOT remove file path    Answer:   [email or web address]    Order Specific Question:   Is patient pregnant?    Answer:   No    All questions were answered. The patient knows to call the clinic with any problems, questions or concerns. The total time spent in the appointment was 20 minutes encounter with  patients including review of chart and various tests results, discussions about plan of care and coordination of care plan   Heath Lark, MD 08/15/2020 1:39 PM  INTERVAL HISTORY: Please see below for problem oriented charting. She returns for further follow-up she is doing well she has occasional nausea her chronic pain is stable denies abdominal pain or changes in bowel habits no recent infection, fever or chills  SUMMARY OF ONCOLOGIC HISTORY: Oncology History Overview Note  BRCA1 positive   Cancer of right colon ([place])  09/16/2014 Imaging   CT abd/pel:  a 3.8cm cecal mass with associated ileocecal intussusception, and a 9.1cm solid and cystic mass in the left pelvis,   09/18/2014 Surgery   right hemicolectomy and terminal ileoectomy   09/18/2014 Pathologic Stage   pT3pN1Mx, tumor extends into pericolonic soft tissue and is less than 27m from the serosal surface, LVI 8-), PNI (-), all 23 node negative, one soft tissue tumor deposit, surgical margins negative.    09/18/2014 Initial Diagnosis   Adenocarcinoma of right colon   08/06/2016 Imaging   CT chest/abd/pelvis with contrast IMPRESSION: 1. No recurrent malignancy identified. 2. 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 On the prior exam from 09/13/2015 there was back flow of contrast in the left external iliac artery to supply the left internal iliac artery ; this back flow of contrast is no longer present in the left external iliac artery is occluded. 3. Several pulmonary nodules at or below 4 mm diameter, unchanged. 4. 3 mm hypodensity  posteriorly in the body of the pancreas, not well seen previously and subtle today, likely incidental, but merits observation. 5. 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 Prominent degenerative spurring of both acetabula. 6. Lumbar spondylosis and degenerative disc disease causing prominent impingement at  L4-5 and mild impingement at L5-S1.   03/08/2017 Imaging   CT CAP W Contrast 03/08/17 IMPRESSION: Chest Impression: 1. Stable small pulmonary nodules within LEFT lung. 2. No mediastinal lymphadenopathy.  Abdomen / Pelvis Impression: 1. No evidence of metastatic colorectal cancer or ovarian cancer within the abdomen pelvis. 2. No lymphadenopathy. 3. Postsurgical change consistent RIGHT hemicolectomy and hysterectomy 4. A fem-fem arterial bypass noted.   11/13/2017 Imaging   CT AP w contrast IMPRESSION: 1. No findings of recurrent malignancy in the abdomen or pelvis. 2. Other imaging findings of potential clinical significance:Chronic AVN of both femoral heads without contour abnormality. Lumbar impingement at L4-5 and L5-S1. Aortic Atherosclerosis (ICD10-I70.0). 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 Partial right hemicolectomy. Hysterectomy.   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 ([place])  10/09/2014 Imaging   CT of the abdomen and pelvis showed a 9.1 cm solid and cystic mass in the left pelvis concerning for malignancy. This was discovered during her workup for colon cancer.   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 Tumor size 3.7 cm, no lymph nodes removed. T1cNx. Tumor cells are positive for cytokeratin 7 and estrogen receptor.   11/01/2014 Surgery   B/l Salpingo-oophorectomy.   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 Stable small pulmonary nodules within LEFT lung. 2. No mediastinal lymphadenopathy. Abdomen / Pelvis Impression: 1. No evidence of metastatic  colorectal cancer or ovarian cancer within the abdomen pelvis. 2. No lymphadenopathy. 3. Postsurgical change consistent RIGHT hemicolectomy and hysterectomy 4. A fem-fem arterial bypass noted.   03/12/2017 Mammogram   MM Right Breast 03/12/17 IMPRESSION: No mammographic or sonographic evidence of malignancy. No correlate identified for the abnormal enhancement seen within the right nipple on MRI of 02/27/2017.    04/02/2017 Pathology Results   Surgical Pathology of Nipple Biopsy: 04/02/17 Diagnosis Nipple Biopsy, Right - BENIGN NIPPLE TISSUE. - NO ATYPIA OR MALIGNANCY.    11/13/2017 Imaging   1. No findings of recurrent malignancy in the abdomen or pelvis. 2. Other imaging findings of potential clinical significance: Chronic AVN of both femoral heads without contour abnormality. Lumbar impingement at L4-5 and L5-S1. Aortic Atherosclerosis (ICD10-I70.0). 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 Partial right hemicolectomy. Hysterectomy.   03/22/2018 Mammogram   Screening mammogram IMPRESSION No evidence of malignancy.     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   06/06/2019 Imaging   1. Tiny bilateral pulmonary nodules stable since 11/12/2018. Continued close attention on follow-up recommended. 2. Incompletely visualized upper abdominal lymphadenopathy better characterized on the abdomen CT from  2 weeks ago. 3.  Aortic Atherosclerois (ICD10-170.0) 4.  Emphysema. ([id number]-D17.9)   06/07/2019 Procedure   Technically successful CT guided core needle biopsy of indeterminate peritoneal nodule within the right lower abdomen/pelvis.   06/07/2019 Pathology Results   Soft Tissue Needle Core Biopsy,  peritoneal nodule within right lower abdomen - METASTATIC CARCINOMA CONSISTENT WITH PATIENT'S CLINICAL HISTORY OF PRIMARY OVARIAN CARCINOMA. SEE NOTE Diagnosis Note Immunohistochemical stains show that the tumor cells are positive for CK7, ER and PAX 8; and negative for CK20 and CDX2. This immunostaining profile is consistent with above diagnosis.   06/16/2019 Tumor Marker   Patient's tumor was tested for the following markers: CA-125 Results of the tumor marker test revealed 499   06/20/2019 - 11/14/2019 Chemotherapy   The patient had carboplatin and taxol   07/11/2019 Tumor Marker   Patient's tumor was tested for the following markers: CA-125 Results of the tumor marker test revealed 438   08/05/2019 Tumor Marker   Patient's tumor was tested for the following markers: CA-125 Results of the tumor marker test revealed 272.   08/25/2019 Imaging   Ct chest, abdomen and pelvis 1. Slight interval decrease in size of a right lower quadrant peritoneal nodule measuring 1.7 x 1.6 cm, previously 2.2 x 2.2 cm (series 2, image 94, series 5, image 58). Findings are consistent with treatment response.   2. No significant change in ill-defined soft tissue about the sigmoid colon in the hysterectomy bed (series 2, image 101).   3.  Status post hysterectomy.   4. No evidence of metastatic disease in the chest. Stable benign small pulmonary nodules.   5.  Coronary artery disease.  Aortic Atherosclerosis (ICD10-I70.0).     08/26/2019 Tumor Marker   Patient's tumor was tested for the following markers: CA-125 Results of the tumor marker test revealed 189.   09/26/2019 Tumor Marker   Patient's tumor was tested for the following markers: CA-125 Results of the tumor marker test revealed 137   10/03/2019 Tumor Marker   Patient's tumor was tested for the following markers: CA-125 Results of the tumor marker test revealed 137.   10/31/2019 Tumor Marker   Patient's tumor was tested for the following  markers: CA-125 Results of the tumor marker test revealed 101   11/29/2019 Imaging   1. Right lower quadrant peritoneal implant is decreased. Stable irregular nodular pelvic peritoneal thickening superior to the vaginal cuff. 2. No new or progressive metastatic disease. No abdominopelvic lymphadenopathy. 3.  Aortic Atherosclerosis (ICD10-I70.0).   12/15/2019 -  Chemotherapy   The patient had Lynparza for chemotherapy treatment.     12/23/2019 Tumor Marker   Patient's tumor was tested for the following markers: CA-125 Results of the tumor marker test revealed 55.9   01/02/2020 Tumor Marker   Patient's tumor was tested for the following markers: CA-125. Results of the tumor marker test revealed 58.6   02/03/2020 Tumor Marker   Patient's tumor was tested for the following markers: CA-125 Results of the tumor marker test revealed 52.2   02/24/2020 Tumor Marker   Patient's tumor was tested for the following markers: CA-125 Results of the tumor marker test revealed 64.8   04/19/2020 Imaging   1. Continued slight interval decrease in size of a soft tissue nodule in the right paracolic gutter, consistent with treatment response. 2. Soft tissue in the hysterectomy bed near the distal sigmoid colon and rectum may be slightly decreased in size, difficult to measure due to configuration but apparently diminished in size, also consistent  with treatment response. 3. Status post hysterectomy and oophorectomy as well as right colon resection. 4. No evidence of new metastatic disease in the abdomen or pelvis. 5. Redemonstrated long segment occlusion of the left common, internal, and external iliac arteries. Status post femoral-femoral bypass grafting with reconstitution of the left common femoral artery. Aortic Atherosclerosis (ICD10-I70.0).     REVIEW OF SYSTEMS:   Constitutional: Denies fevers, chills or abnormal weight loss Eyes: Denies blurriness of vision Ears, nose, mouth, throat, and face: Denies  mucositis or sore throat Respiratory: Denies cough, dyspnea or wheezes Cardiovascular: Denies palpitation, chest discomfort or lower extremity swelling Gastrointestinal:  Denies nausea, heartburn or change in bowel habits Skin: Denies abnormal skin rashes Lymphatics: Denies new lymphadenopathy or easy bruising Neurological:Denies numbness, tingling or new weaknesses Behavioral/Psych: Mood is stable, no new changes  All other systems were reviewed with the patient and are negative.  I have reviewed the past medical history, past surgical history, social history and family history with the patient and they are unchanged from previous note.  ALLERGIES:  is allergic to other, demerol [meperidine], morphine and related, oxycodone, penicillins, strawberry flavor, tylenol [acetaminophen], and ibuprofen.  MEDICATIONS:  Current Outpatient Medications  Medication Sig Dispense Refill  . acetaminophen (TYLENOL) 325 MG tablet Take 325 mg by mouth as needed.    . docusate sodium 100 MG CAPS Take 100 mg by mouth 2 (two) times daily as needed for mild constipation. 10 capsule 0  . HYDROcodone-acetaminophen (NORCO/VICODIN) 5-325 MG tablet Take 1 tablet by mouth every 6 (six) hours as needed for moderate pain. 60 tablet 0  . lidocaine (LIDODERM) 5 % PLACE 1 PATCH ONTO THE SKIN EVERY DAY.REMOVE AND DISCARD PATCH WITHIN 12 HOURS OR AS DIRECTED BY PRESCRIBER 30 patch 1  . LORazepam (ATIVAN) 0.5 MG tablet Take 1 tablet (0.5 mg total) by mouth 3 (three) times daily as needed for anxiety. 10 tablet 0  . losartan (COZAAR) 50 MG tablet Take 1 tablet (50 mg total) by mouth daily. 90 tablet 3  . olaparib (LYNPARZA) 100 MG tablet Take 2 tablets (200 mg total) by mouth 2 (two) times daily. Swallow whole. May take with food to decrease nausea and vomiting. 120 tablet 11  . ondansetron (ZOFRAN) 8 MG tablet TAKE 1 TABLET(8 MG) BY MOUTH EVERY 8 HOURS AS NEEDED FOR NAUSEA OR VOMITING 90 tablet 3  . pregabalin (LYRICA) 200 MG  capsule Take 1 capsule (200 mg total) by mouth every 8 (eight) hours. Please give patient brand name Lyrica 90 capsule 5  . prochlorperazine (COMPAZINE) 10 MG tablet TAKE 1 TABLET(10 MG) BY MOUTH EVERY 6 HOURS AS NEEDED FOR NAUSEA OR VOMITING 90 tablet 1   No current facility-administered medications for this visit.    PHYSICAL EXAMINATION: ECOG PERFORMANCE STATUS: 1 - Symptomatic but completely ambulatory  Vitals:   08/15/20 1151  BP: 121/75  Pulse: 72  Resp: 18  Temp: (!) 97.4 F (36.3 C)  SpO2: 100%   Filed Weights   08/15/20 1151  Weight: 157 lb 9.6 oz (71.5 kg)    GENERAL:alert, no distress and comfortable SKIN: skin color, texture, turgor are normal, no rashes or significant lesions EYES: normal, Conjunctiva are pink and non-injected, sclera clear OROPHARYNX:no exudate, no erythema and lips, buccal mucosa, and tongue normal  NECK: supple, thyroid normal size, non-tender, without nodularity LYMPH:  no palpable lymphadenopathy in the cervical, axillary or inguinal LUNGS: clear to auscultation and percussion with normal breathing effort HEART: regular rate & rhythm and no  murmurs and no lower extremity edema ABDOMEN:abdomen soft, non-tender and normal bowel sounds Musculoskeletal:no cyanosis of digits and no clubbing  NEURO: alert & oriented x 3 with fluent speech, no focal motor/sensory deficits  LABORATORY DATA:  I have reviewed the data as listed    Component Value Date/Time   NA 143 08/15/2020 1121   NA 141 10/07/2017 1436   K 3.8 08/15/2020 1121   K 4.4 10/07/2017 1436   CL 106 08/15/2020 1121   CO2 31 08/15/2020 1121   CO2 26 10/07/2017 1436   GLUCOSE 71 08/15/2020 1121   GLUCOSE 85 10/07/2017 1436   BUN 17 08/15/2020 1121   BUN 14.9 10/07/2017 1436   CREATININE 0.94 08/15/2020 1121   CREATININE 1.0 10/07/2017 1436   CALCIUM 9.2 08/15/2020 1121   CALCIUM 9.5 10/07/2017 1436   PROT 7.2 08/15/2020 1121   PROT 7.8 10/07/2017 1436   ALBUMIN 4.1 08/15/2020  1121   ALBUMIN 3.9 10/07/2017 1436   AST 14 (L) 08/15/2020 1121   AST 15 10/07/2017 1436   ALT 9 08/15/2020 1121   ALT 7 10/07/2017 1436   ALKPHOS 74 08/15/2020 1121   ALKPHOS 81 10/07/2017 1436   BILITOT 0.4 08/15/2020 1121   BILITOT 0.54 10/07/2017 1436   GFRNONAA >60 08/15/2020 1121   GFRAA >60 08/15/2020 1121    No results found for: SPEP, UPEP  Lab Results  Component Value Date   WBC 5.4 08/15/2020   NEUTROABS 2.4 08/15/2020   HGB 12.2 08/15/2020   HCT 33.7 (L) 08/15/2020   MCV 111.2 (H) 08/15/2020   PLT 124 (L) 08/15/2020      Chemistry      Component Value Date/Time   NA 143 08/15/2020 1121   NA 141 10/07/2017 1436   K 3.8 08/15/2020 1121   K 4.4 10/07/2017 1436   CL 106 08/15/2020 1121   CO2 31 08/15/2020 1121   CO2 26 10/07/2017 1436   BUN 17 08/15/2020 1121   BUN 14.9 10/07/2017 1436   CREATININE 0.94 08/15/2020 1121   CREATININE 1.0 10/07/2017 1436      Component Value Date/Time   CALCIUM 9.2 08/15/2020 1121   CALCIUM 9.5 10/07/2017 1436   ALKPHOS 74 08/15/2020 1121   ALKPHOS 81 10/07/2017 1436   AST 14 (L) 08/15/2020 1121   AST 15 10/07/2017 1436   ALT 9 08/15/2020 1121   ALT 7 10/07/2017 1436   BILITOT 0.4 08/15/2020 1121   BILITOT 0.54 10/07/2017 1436

## 2020-08-15 NOTE — Assessment & Plan Note (Signed)
She is on Olaparib she needs yearly screening mammogram

## 2020-08-15 NOTE — Assessment & Plan Note (Signed)
Her blood counts are stable  She will continue current dose without dose adjustment I will continue to see her every 2 months for further follow-up I plan to repeat imaging study again in November

## 2020-08-15 NOTE — Assessment & Plan Note (Signed)
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

## 2020-08-15 NOTE — Assessment & Plan Note (Signed)
CEA is within normal limits She has no signs of cancer recurrence on imaging study

## 2020-08-16 ENCOUNTER — Telehealth: Payer: Self-pay

## 2020-08-16 LAB — CA 125: Cancer Antigen (CA) 125: 47.1 U/mL — ABNORMAL HIGH (ref 0.0–38.1)

## 2020-08-16 NOTE — Telephone Encounter (Signed)
Called and left below message. Ask her to call for questions.

## 2020-08-16 NOTE — Telephone Encounter (Signed)
-----   Message from Heath Lark, MD sent at 08/16/2020 10:45 AM EDT ----- Regarding: pls call her and let her know the tumor marker result is very good

## 2020-08-24 DIAGNOSIS — R002 Palpitations: Secondary | ICD-10-CM | POA: Diagnosis not present

## 2020-08-24 DIAGNOSIS — Z8249 Family history of ischemic heart disease and other diseases of the circulatory system: Secondary | ICD-10-CM | POA: Diagnosis not present

## 2020-08-24 DIAGNOSIS — I10 Essential (primary) hypertension: Secondary | ICD-10-CM | POA: Diagnosis not present

## 2020-08-24 DIAGNOSIS — Z1379 Encounter for other screening for genetic and chromosomal anomalies: Secondary | ICD-10-CM | POA: Diagnosis not present

## 2020-08-24 DIAGNOSIS — Z1389 Encounter for screening for other disorder: Secondary | ICD-10-CM | POA: Diagnosis not present

## 2020-08-27 MED FILL — LYNPARZA 100 MG TAB: 100 | 30 days supply | Qty: 120 | Fill #8

## 2020-08-28 ENCOUNTER — Telehealth: Payer: Self-pay | Admitting: Family

## 2020-08-28 NOTE — Telephone Encounter (Signed)
Patient called and is requesting a medication refill that is no longer on her current medication list. She also does not know the name of the medication but states that it is for a nebulizer machine. Please call the patient back: [phone]

## 2020-08-30 NOTE — Telephone Encounter (Signed)
Please call the patient and get more information. I am assuming she is talking about albuterol but I don't see that on her medication list. If she is having problems breathing, she needs to be seen.

## 2020-08-30 NOTE — Telephone Encounter (Signed)
Called pt there was no answer LMom w/Laura response.Marland KitchenJohny Chess

## 2020-09-06 ENCOUNTER — Ambulatory Visit (INDEPENDENT_AMBULATORY_CARE_PROVIDER_SITE_OTHER): Payer: Medicare Other | Admitting: Neurology

## 2020-09-06 ENCOUNTER — Encounter: Payer: Self-pay | Admitting: Neurology

## 2020-09-06 ENCOUNTER — Other Ambulatory Visit: Payer: Self-pay

## 2020-09-06 VITALS — BP 150/94 | HR 71 | Ht 67.0 in | Wt 154.5 lb

## 2020-09-06 DIAGNOSIS — M79605 Pain in left leg: Secondary | ICD-10-CM | POA: Diagnosis not present

## 2020-09-06 MED ORDER — PREGABALIN 200 MG PO CAPS: 200.0000 mg | ORAL_CAPSULE | Freq: Three times a day (TID) | ORAL | 4 refills | Status: DC

## 2020-09-06 NOTE — Progress Notes (Signed)
PATIENT: [name] DOB: [birth year]  Chief Complaint  Patient presents with  . Left leg numbness/Pain    She has continued taking Lyrica 200mg , 1-3 capsules per day, depending on the level of discomfort. Reports her gait has worsened since last seen. She is still tying to excerise and walks on a treadmill daily.      HISTORICAL  Belanna Manring is a 59 year old female, seen in request by her primary care nurse practitioner Caesar Chestnut for evaluation of left leg pain, difficulty walking, initial evaluation was on June 08, 2019.  I have reviewed and summarized the referring note from the referring physician.  She had past medical history of hypertension, I saw her previously in April 2016, she was diagnosed with right-sided colon cancer following GI bleeding in October 2015, also found to have ovarian cancer, she underwent exploratory laparotomy with excision of pelvic mass, and left ovarian in December 2015, third surgery in December 26, 2014 for exploratory lap prostatectomy with right oophorectomy, pelvic and periaortic lymph node dissection, omentumectomy, she complains of left lower extremity radiating pain since the second surgery in February 2016, previously had extensive evaluation, negative for left lower extremity DVT, MRI of pelvic without contrast in March 2016 from [facility] showed 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, 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, she also had electrodiagnostic study in April 2016, which suggestive of left L4, L5-S1 lumbar plexopathy, CT of lumbar spine showed moderate to severe lower lumbar facet arthrosis, worst at L4-5, with evidence of moderate spinal canal stenosis, moderate bilateral neural foraminal stenosis  The conclusion at that time for her radiating left lower extremity pain was likely the combination of left hip pathology, a component of left lumbar plexopathy, her pain was failed to be controlled by Lyrica, Lortab, I treated  her with fentanyl patch, for a while, she lost to follow-up  Patient reported that her left leg pain overall was under reasonable control until June 2020, she complains of increased lower abdomen, left lower extremity pain, repeat CT abdomen on May 24, 2015 showed new peritoneal carcinomatosis in the pelvic, and right lower quadrant, she just had a CT-guided biopsy of peritoneal nodule within the right lower abdomen pelvic  She now complains of significant left lower extremity pain from the hip down, difficulty bearing weight, low back pain, right leg is her stronger leg, she denies bowel and bladder incontinence.  UPDATE March 20 2020: She continue complains of bilateral lower extremity pain, she has been receiving chemotherapy for her left ovarian cancer, continue complains of bilateral lower extremity, hip pain,   Reviewed MRI of pelvic with without contrast in March 2016, avascular necrosis of left hip with approximately 40 to 50% involvement of the articular surface, no evidence of collapse, mild facet signal abnormality involving the right femoral head neck junction anteriorly and posteriorly, which could represent early right hip avascular necrosis versus degenerative changes, degenerative changes of the visualized lower lumbar region  EMG nerve conduction study in August 2020 suggest 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  She ambulate without assistant,  UPDATE Sep 06 2020: She has finished treatment in Jan 2021, feels great, exercise regularly.  REVIEW OF SYSTEMS: Full 14 system review of systems performed and notable only for as above All other review of systems were negative.  ALLERGIES: Allergies  Allergen Reactions  . Other Hives and Itching    Highly acidic foods "make me itch and break out" oranges,  tomatoes  . Demerol [Meperidine] Nausea And Vomiting  . Morphine And Related Nausea And Vomiting  . Oxycodone Itching  . Penicillins Other (See Comments)    Childhood allergy  . Strawberry Flavor Itching    Mrs Shain reports to [facility] RN CM she is allergic to Strawberries and anything strawberry flavored   . Tylenol [Acetaminophen] Nausea And Vomiting  . Ibuprofen Nausea And Vomiting    HOME MEDICATIONS: Current Outpatient Medications  Medication Sig Dispense Refill  . acetaminophen (TYLENOL) 325 MG tablet Take 325 mg by mouth as needed.    . docusate sodium 100 MG CAPS Take 100 mg by mouth 2 (two) times daily as needed for mild constipation. 10 capsule 0  . HYDROcodone-acetaminophen (NORCO/VICODIN) 5-325 MG tablet Take 1 tablet by mouth every 6 (six) hours as needed for moderate pain. 60 tablet 0  . lidocaine (LIDODERM) 5 % PLACE 1 PATCH ONTO THE SKIN EVERY DAY.REMOVE AND DISCARD PATCH WITHIN 12 HOURS OR AS DIRECTED BY PRESCRIBER 30 patch 1  . LORazepam (ATIVAN) 0.5 MG tablet Take 1 tablet (0.5 mg total) by mouth 3 (three) times daily as needed for anxiety. 10 tablet 0  . losartan (COZAAR) 50 MG tablet Take 1 tablet (50 mg total) by mouth daily. 90 tablet 3  . olaparib (LYNPARZA) 100 MG tablet Take 2 tablets (200 mg total) by mouth 2 (two) times daily. Swallow whole. May take with food to decrease nausea and vomiting. 120 tablet 11  . ondansetron (ZOFRAN) 8 MG tablet TAKE 1 TABLET(8 MG) BY MOUTH EVERY 8 HOURS AS NEEDED FOR NAUSEA OR VOMITING 90 tablet 3  . pregabalin (LYRICA) 200 MG capsule Take 1 capsule (200 mg total) by mouth every 8 (eight) hours. Please give patient brand name Lyrica 90 capsule 5  . prochlorperazine (COMPAZINE) 10 MG tablet TAKE 1 TABLET(10 MG) BY MOUTH EVERY 6 HOURS AS NEEDED FOR NAUSEA OR VOMITING 90 tablet 1   No current facility-administered medications for this visit.    PAST MEDICAL HISTORY: Past Medical History:  Diagnosis Date  . Anemia   . Anxiety   .  Arthritis   . Asthma   . Cancer ([place]) dx'd 08/2014   colon and ovarian  . Complication of anesthesia    woke and saw long tube coming out of mouth . sat up then went back to sleep  . Depression   . Ectopic pregnancy   . Family history of adverse reaction to anesthesia    sister on dialysis- hypotension   . Family history of ovarian cancer   . Head injury, closed, with brief LOC ([place])    had a cut- as a child  . Headache   . Hepatitis    being treated for Hep C  . History of blood transfusion    2005 approximately   . Hypertension    no meds now  . PAD (peripheral artery disease) ([place]) 04/18/2015  . Pelvic cyst   . PONV (postoperative nausea and vomiting)   . PTSD (post-traumatic stress disorder)     PAST SURGICAL HISTORY: Past Surgical History:  Procedure Laterality Date  . ABDOMINAL HYSTERECTOMY    . BREAST BIOPSY Right 04/02/2017   Procedure: INCISIONAL BIOPSY RIGHT NIPPLE;  Surgeon: Coralie Keens, MD;  Location: [place];  Service: General;  Laterality: Right;  . COLONOSCOPY    . ESOPHAGOGASTRODUODENOSCOPY N/A  09/17/2014   Procedure: ESOPHAGOGASTRODUODENOSCOPY (EGD);  Surgeon: Jeryl Columbia, MD;  [facility] ENDOSCOPY;  Service: Endoscopy;  Laterality: N/A;  . FEMORAL-FEMORAL BYPASS GRAFT Bilateral 04/18/2015   Procedure:  RIGHT COMMON FEMORAL TO LEFT COMMON  FEMORAL  ARTERY BYPASS WITH  8 MM HEMASHIELD GRAFT,RIGHT COMMON SUPERFICIAL FEMORAL ARTERY ENDARTERECTOMY;  Surgeon: Elam Dutch, MD;  Location: [place];  Service: Vascular;  Laterality: Bilateral;  . ILEOSTOMY Right 09/18/2014   Procedure: ILEOCOLOSTOMY;  Surgeon: Doreen Salvage, MD;  Location: [place];  Service: General;  Laterality: Right;  . LAPAROTOMY N/A 11/01/2014   Procedure: EXPLORATORY LAPAROTOMY;  Surgeon: Doreen Salvage, MD;  Location: [place];  Service: General;  Laterality: N/A;  . LAPAROTOMY N/A 12/26/2014   Procedure:  LAPAROTOMY ;  Surgeon: Everitt Amber, MD;  Location: [facility];  Service: Gynecology;   Laterality: N/A;  . MASS EXCISION  11/01/2014   Procedure: EXCISION PELVIC MASS;  Surgeon: Doreen Salvage, MD;  Location: [place];  Service: General;;  . OOPHORECTOMY  2016  . PARTIAL COLECTOMY Right 09/18/2014   Procedure: RIGHT HEMI COLECTOMY;  Surgeon: Doreen Salvage, MD;  Location: [place];  Service: General;  Laterality: Right;  . ROBOTIC ASSISTED TOTAL HYSTERECTOMY WITH BILATERAL SALPINGO OOPHERECTOMY Right 12/26/2014   Procedure: EXPLORATORY LAPAROSCOPY  RIGHT SALPINGO OOPHORECTOMY OMENTECTOMY LYMPHADECTOMY ;  Surgeon: Everitt Amber, MD;  Location: [facility];  Service: Gynecology;  Laterality: Right;  . TUBAL LIGATION      FAMILY HISTORY: Family History  Problem Relation Age of Onset  . Brain cancer Sister 58  . Lung cancer Brother 8  . Heart attack Mother   . Heart attack Father        12s  . Heart disease Father   . Heart attack Maternal Uncle   . Diabetes Maternal Uncle   . Breast cancer Maternal Uncle   . Ovarian cancer Paternal Aunt        3 paternal aunts with ovarian cancer  . Heart attack Brother   . Diabetes Paternal Uncle     SOCIAL HISTORY: Social History   Socioeconomic History  . Marital status: Married    Spouse name: Juanda Crumble  . Number of children: 6  . Years of education: 82  . Highest education level: High school graduate  Occupational History  . Occupation: Disability  Tobacco Use  . Smoking status: Current Some Day Smoker    Years: 10.00    Types: Cigars  . Smokeless tobacco: Never Used  . Tobacco comment: pack per month of cigars = 5  Vaping Use  . Vaping Use: Never used  Substance and Sexual Activity  . Alcohol use: No  . Drug use: Not Currently    Frequency: 5.0 times per week    Types: Marijuana    Comment: last smoked 09/22/17  . Sexual activity: Never  Other Topics Concern  . Not on file  Social History Narrative   Born and raised in [place], [place]. Currently resides in a house by herself. No pets. Fun: Read, shoot pool, poetry, Journal, knitting     Denies religious beliefs that would effect health care.    Lives at home with her son.   Right-handed.   No caffeine use.   Denies abuse and feels safe at home.    Social Determinants of Health   Financial Resource Strain:   . Difficulty of Paying Living Expenses: Not on file  Food Insecurity:   . Worried About Charity fundraiser in the Last  Year: Not on file  . Ran Out of Food in the Last Year: Not on file  Transportation Needs:   . Lack of Transportation (Medical): Not on file  . Lack of Transportation (Non-Medical): Not on file  Physical Activity:   . Days of Exercise per Week: Not on file  . Minutes of Exercise per Session: Not on file  Stress:   . Feeling of Stress : Not on file  Social Connections:   . Frequency of Communication with Friends and Family: Not on file  . Frequency of Social Gatherings with Friends and Family: Not on file  . Attends Religious Services: Not on file  . Active Member of Clubs or Organizations: Not on file  . Attends Archivist Meetings: Not on file  . Marital Status: Not on file  Intimate Partner Violence: Not At Risk  . Fear of Current or Ex-Partner: No  . Emotionally Abused: No  . Physically Abused: No  . Sexually Abused: No     PHYSICAL EXAM   Vitals:   09/06/20 1506  BP: (!) 150/94  Pulse: 71  Weight: 154 lb 8 oz (70.1 kg)  Height: 5\' 7"  (1.702 m)   Not recorded     Body mass index is 24.2 kg/m.  PHYSICAL EXAMNIATION:  Gen: NAD, conversant, well nourised, obese, well groomed                     Cardiovascular: Regular rate rhythm, no peripheral edema, warm, nontender. Eyes: Conjunctivae clear without exudates or hemorrhage Neck: Supple, no carotid bruits. Pulmonary: Clear to auscultation bilaterally   NEUROLOGICAL EXAM:  MENTAL STATUS: Speech/cognition: Awake alert oriented to history taking and casual conversation   CRANIAL NERVES: CN II: Visual fields are full to confrontation. Pupils are round  equal and briskly reactive to light. CN III, IV, VI: extraocular movement are normal. No ptosis. CN V: Facial sensation is intact to pinprick in all 3 divisions bilaterally. Corneal responses are intact.  CN VII: Face is symmetric with normal eye closure and smile. CN VIII: Hearing is normal to rubbing fingers CN IX, X: Palate elevates symmetrically. Phonation is normal. CN XI: Head turning and shoulder shrug are intact CN XII: Tongue is midline with normal movements and no atrophy.  MOTOR: No significant muscle weakness noted.  REFLEXES: Reflexes are 2+ and symmetric at the biceps, triceps, knees, and ankles. Plantar responses are flexor.  SENSORY: Intact to light touch, pinprick, positional sensation and vibratory sensation are intact in fingers and toes.  COORDINATION: Rapid alternating movements and fine finger movements are intact. There is no dysmetria on finger-to-nose and heel-knee-shin.    GAIT/STANCE: She can get up from seated position arm crossed, steady  DIAGNOSTIC DATA (LABS, IMAGING, TESTING) - I reviewed patient records, labs, notes, testing and imaging myself where available.   ASSESSMENT AND PLAN  Lujuana Kapler is a 59 y.o. 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 Ph.D.  [facility] [address], [place], [place] [address] Ph: [phone] Fax: [phone]  CC: Lance Sell, NP

## 2020-09-12 ENCOUNTER — Telehealth: Payer: Self-pay

## 2020-09-12 ENCOUNTER — Other Ambulatory Visit: Payer: Self-pay | Admitting: Hematology and Oncology

## 2020-09-12 MED ORDER — HYDROCODONE-ACETAMINOPHEN 5-325 MG PO TABS
1.0000 | ORAL_TABLET | Freq: Four times a day (QID) | ORAL | 0 refills | Status: DC | PRN
Start: 2020-09-12 — End: 2020-10-12

## 2020-09-12 NOTE — Telephone Encounter (Signed)
done

## 2020-09-12 NOTE — Telephone Encounter (Signed)
Called and told Rx sent to pharmacy. She verbalized understanding. 

## 2020-09-12 NOTE — Telephone Encounter (Signed)
She called and requested a refill on Hydrocodone Rx.

## 2020-09-17 DIAGNOSIS — I10 Essential (primary) hypertension: Secondary | ICD-10-CM | POA: Diagnosis not present

## 2020-09-20 ENCOUNTER — Ambulatory Visit: Payer: Medicare Other | Admitting: Neurology

## 2020-09-21 MED FILL — LYNPARZA 100 MG TAB: 100 | 30 days supply | Qty: 120 | Fill #9

## 2020-10-12 ENCOUNTER — Telehealth: Payer: Self-pay

## 2020-10-12 ENCOUNTER — Other Ambulatory Visit: Payer: Self-pay | Admitting: Medical

## 2020-10-12 MED ORDER — HYDROCODONE-ACETAMINOPHEN 5-325 MG PO TABS
1.0000 | ORAL_TABLET | Freq: Four times a day (QID) | ORAL | 0 refills | Status: DC | PRN
Start: 2020-10-12 — End: 2020-11-02

## 2020-10-12 NOTE — Telephone Encounter (Signed)
She called and left a message requesting refill on Hydrocodone Rx. She is completely out of Rx.  Called back and told her Rx sent to [facility] pharmacy by Sandi Mealy, PA. Reminded her of refill policy. She verbalized understanding.  FYI

## 2020-10-22 ENCOUNTER — Other Ambulatory Visit: Payer: Self-pay

## 2020-10-22 ENCOUNTER — Ambulatory Visit (HOSPITAL_COMMUNITY)
Admission: RE | Admit: 2020-10-22 | Discharge: 2020-10-22 | Disposition: A | Discharge status: Home / Self Care | Payer: Medicare Other | Source: Ambulatory Visit | Attending: Hematology and Oncology | Admitting: Hematology and Oncology

## 2020-10-22 DIAGNOSIS — I745 Embolism and thrombosis of iliac artery: Secondary | ICD-10-CM | POA: Diagnosis not present

## 2020-10-22 DIAGNOSIS — M160 Bilateral primary osteoarthritis of hip: Secondary | ICD-10-CM | POA: Diagnosis not present

## 2020-10-22 DIAGNOSIS — M5137 Other intervertebral disc degeneration, lumbosacral region: Secondary | ICD-10-CM | POA: Diagnosis not present

## 2020-10-22 DIAGNOSIS — M47816 Spondylosis without myelopathy or radiculopathy, lumbar region: Secondary | ICD-10-CM | POA: Diagnosis not present

## 2020-10-22 DIAGNOSIS — C562 Malignant neoplasm of left ovary: Secondary | ICD-10-CM | POA: Diagnosis not present

## 2020-10-22 DIAGNOSIS — C182 Malignant neoplasm of ascending colon: Secondary | ICD-10-CM

## 2020-10-22 LAB — POCT I-STAT CREATININE: Creatinine, Ser: 1.1 mg/dL — ABNORMAL HIGH (ref 0.44–1.00)

## 2020-10-22 MED ORDER — IOHEXOL 300 MG/ML  SOLN
100.0000 mL | Freq: Once | INTRAMUSCULAR | Status: AC | PRN
  Administered 2020-10-22: 100 mL via INTRAVENOUS

## 2020-10-23 ENCOUNTER — Inpatient Hospital Stay: Payer: Medicare Other | Attending: Hematology | Admitting: Hematology and Oncology

## 2020-10-23 ENCOUNTER — Encounter: Payer: Self-pay | Admitting: Hematology and Oncology

## 2020-10-23 ENCOUNTER — Other Ambulatory Visit: Payer: Self-pay

## 2020-10-23 ENCOUNTER — Inpatient Hospital Stay: Payer: Medicare Other

## 2020-10-23 VITALS — BP 161/95 | HR 75 | Temp 97.4°F | Resp 18 | Ht 67.0 in | Wt 152.2 lb

## 2020-10-23 DIAGNOSIS — I10 Essential (primary) hypertension: Secondary | ICD-10-CM | POA: Insufficient documentation

## 2020-10-23 DIAGNOSIS — Z90710 Acquired absence of both cervix and uterus: Secondary | ICD-10-CM | POA: Diagnosis not present

## 2020-10-23 DIAGNOSIS — Z79899 Other long term (current) drug therapy: Secondary | ICD-10-CM | POA: Diagnosis not present

## 2020-10-23 DIAGNOSIS — C182 Malignant neoplasm of ascending colon: Secondary | ICD-10-CM

## 2020-10-23 DIAGNOSIS — Z9221 Personal history of antineoplastic chemotherapy: Secondary | ICD-10-CM | POA: Diagnosis not present

## 2020-10-23 DIAGNOSIS — C786 Secondary malignant neoplasm of retroperitoneum and peritoneum: Secondary | ICD-10-CM | POA: Diagnosis not present

## 2020-10-23 DIAGNOSIS — C562 Malignant neoplasm of left ovary: Secondary | ICD-10-CM | POA: Diagnosis not present

## 2020-10-23 DIAGNOSIS — R110 Nausea: Secondary | ICD-10-CM

## 2020-10-23 DIAGNOSIS — G893 Neoplasm related pain (acute) (chronic): Secondary | ICD-10-CM

## 2020-10-23 DIAGNOSIS — D61818 Other pancytopenia: Secondary | ICD-10-CM | POA: Insufficient documentation

## 2020-10-23 DIAGNOSIS — Z85038 Personal history of other malignant neoplasm of large intestine: Secondary | ICD-10-CM | POA: Insufficient documentation

## 2020-10-23 DIAGNOSIS — G609 Hereditary and idiopathic neuropathy, unspecified: Secondary | ICD-10-CM | POA: Insufficient documentation

## 2020-10-23 LAB — CBC WITH DIFFERENTIAL (CANCER CENTER ONLY)
Abs Immature Granulocytes: 0.01 10*3/uL (ref 0.00–0.07)
Basophils Absolute: 0 10*3/uL (ref 0.0–0.1)
Basophils Relative: 0 %
Eosinophils Absolute: 0 10*3/uL (ref 0.0–0.5)
Eosinophils Relative: 1 %
HCT: 35.7 % — ABNORMAL LOW (ref 36.0–46.0)
Hemoglobin: 13.1 g/dL (ref 12.0–15.0)
Immature Granulocytes: 0 %
Lymphocytes Relative: 46 %
Lymphs Abs: 1.8 10*3/uL (ref 0.7–4.0)
MCH: 39.8 pg — ABNORMAL HIGH (ref 26.0–34.0)
MCHC: 36.7 g/dL — ABNORMAL HIGH (ref 30.0–36.0)
MCV: 108.5 fL — ABNORMAL HIGH (ref 80.0–100.0)
Monocytes Absolute: 0.3 10*3/uL (ref 0.1–1.0)
Monocytes Relative: 7 %
Neutro Abs: 1.8 10*3/uL (ref 1.7–7.7)
Neutrophils Relative %: 46 %
Platelet Count: 110 10*3/uL — ABNORMAL LOW (ref 150–400)
RBC: 3.29 MIL/uL — ABNORMAL LOW (ref 3.87–5.11)
RDW: 13.9 % (ref 11.5–15.5)
WBC Count: 4 10*3/uL (ref 4.0–10.5)
nRBC: 0 % (ref 0.0–0.2)

## 2020-10-23 LAB — CMP (CANCER CENTER ONLY)
ALT: 7 U/L (ref 0–44)
AST: 17 U/L (ref 15–41)
Albumin: 4.5 g/dL (ref 3.5–5.0)
Alkaline Phosphatase: 60 U/L (ref 38–126)
Anion gap: 9 (ref 5–15)
BUN: 7 mg/dL (ref 6–20)
CO2: 28 mmol/L (ref 22–32)
Calcium: 10.2 mg/dL (ref 8.9–10.3)
Chloride: 106 mmol/L (ref 98–111)
Creatinine: 1.01 mg/dL — ABNORMAL HIGH (ref 0.44–1.00)
GFR, Estimated: >60 mL/min (ref >60)
Glucose, Bld: 94 mg/dL (ref 70–99)
Potassium: 4.2 mmol/L (ref 3.5–5.1)
Sodium: 143 mmol/L (ref 135–145)
Total Bilirubin: 0.8 mg/dL (ref 0.3–1.2)
Total Protein: 7.4 g/dL (ref 6.5–8.1)

## 2020-10-23 LAB — CEA (IN HOUSE-CHCC): CEA (CHCC-In House): 1.25 ng/mL (ref 0.00–5.00)

## 2020-10-23 MED ORDER — ONDANSETRON HCL 8 MG PO TABS: ORAL_TABLET | ORAL | 3 refills | Status: DC

## 2020-10-23 NOTE — Assessment & Plan Note (Signed)
Her blood counts are stable  I have personally reviewed her CT imaging which show excellent response to therapy She will continue current dose without dose adjustment I will continue to see her every 3 months for further follow-up I plan to repeat imaging study again next year in November 2022

## 2020-10-23 NOTE — Progress Notes (Signed)
[place] OFFICE PROGRESS NOTE  Patient Care Team: Marrian Salvage, [place] as PCP - General (Internal Medicine) Truitt Merle, MD as Consulting Physician (Hematology) Judeth Horn, MD as Consulting Physician (General Surgery) Nat Math, MD as Referring Physician (Obstetrics and Gynecology) Elam Dutch, MD as Consulting Physician (Vascular Surgery) [name], MD as Attending Physician (Gynecologic Oncology) Heath Lark, MD as Consulting Physician (Hematology and Oncology)  ASSESSMENT & PLAN:  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 [facility]) Her blood counts are stable  I have personally reviewed her CT imaging which show excellent response to therapy She will continue current dose without dose adjustment I will continue to see her every 3 months for further follow-up I plan to repeat imaging study again next year in November 2022  Cancer of right colon [facility]) CEA is within normal limits She has no signs of cancer recurrence on imaging study She has declines colonoscopy follow-up  Cancer associated pain She has idiopathic neuropathy even before treatment She takes chronic hydrocodone for this pain We discussed narcotic refill policy  Pancytopenia, acquired ([place]) Her pancytopenia is stable/improved We will continue current treatment without dose adjustment She is not symptomatic  Neuropathy, idiopathic She had recent flare of peripheral neuropathy of unknown etiology Her pain is better now She will continue taking Lyrica and her prescribed pain medicine  Nausea without vomiting She has mild intermittent nausea I refill her prescription antiemetics  Essential hypertension Her blood pressure is intermittently elevated, likely attributable to anxiety Observe for now   No orders of the defined types were placed in this encounter.   All questions were answered. The patient knows to call the clinic with any problems, questions or concerns. The total time spent  in the appointment was 30 minutes encounter with patients including review of chart and various tests results, discussions about plan of care and coordination of care plan   Heath Lark, MD 10/23/2020 1:49 PM  INTERVAL HISTORY: Please see below for problem oriented charting. She returns for further follow-up to review test results She tolerated treatment well She has occasional nausea No changes in bowel habits No worsening peripheral neuropathy Her chronic pain is stable  SUMMARY OF ONCOLOGIC HISTORY: Oncology History Overview Note  BRCA1 positive   Cancer of right colon ([place])  09/16/2014 Imaging   CT abd/pel:  a 3.8cm cecal mass with associated ileocecal intussusception, and a 9.1cm solid and cystic mass in the left pelvis,   09/18/2014 Surgery   right hemicolectomy and terminal ileoectomy   09/18/2014 Pathologic Stage   pT3pN1Mx, tumor extends into pericolonic soft tissue and is less than 79m from the serosal surface, LVI 8-), PNI (-), all 23 node negative, one soft tissue tumor deposit, surgical margins negative.    09/18/2014 Initial Diagnosis   Adenocarcinoma of right colon   08/06/2016 Imaging   CT chest/abd/pelvis with contrast IMPRESSION: 1. No recurrent malignancy identified. 2. 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 On the prior exam from 09/13/2015 there was back flow of contrast in the left external iliac artery to supply the left internal iliac artery ; this back flow of contrast is no longer present in the left external iliac artery is occluded. 3. Several pulmonary nodules at or below 4 mm diameter, unchanged. 4. 3 mm hypodensity posteriorly in the body of the pancreas, not well seen previously and subtle today, likely incidental, but merits observation. 5. 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 Prominent degenerative spurring of both acetabula. 6. Lumbar spondylosis and  degenerative disc disease causing prominent impingement at L4-5 and mild impingement at L5-S1.   03/08/2017 Imaging   CT CAP W Contrast 03/08/17 IMPRESSION: Chest Impression: 1. Stable small pulmonary nodules within LEFT lung. 2. No mediastinal lymphadenopathy.  Abdomen / Pelvis Impression: 1. No evidence of metastatic colorectal cancer or ovarian cancer within the abdomen pelvis. 2. No lymphadenopathy. 3. Postsurgical change consistent RIGHT hemicolectomy and hysterectomy 4. A fem-fem arterial bypass noted.   11/13/2017 Imaging   CT AP w contrast IMPRESSION: 1. No findings of recurrent malignancy in the abdomen or pelvis. 2. Other imaging findings of potential clinical significance:Chronic AVN of both femoral heads without contour abnormality. Lumbar impingement at L4-5 and L5-S1. Aortic Atherosclerosis (ICD10-I70.0). 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 Partial right hemicolectomy. Hysterectomy.   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 ([place])  10/09/2014 Imaging   CT of the abdomen and pelvis showed a 9.1 cm solid and cystic mass in the left pelvis concerning for malignancy. This was discovered during her workup for colon cancer.   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 Tumor size 3.7 cm, no lymph nodes removed. T1cNx. Tumor cells are positive for cytokeratin 7 and estrogen receptor.   11/01/2014 Surgery   B/l Salpingo-oophorectomy.   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 Stable small pulmonary nodules within LEFT lung. 2. No mediastinal  lymphadenopathy. Abdomen / Pelvis Impression: 1. No evidence of metastatic colorectal cancer or ovarian cancer within the abdomen pelvis. 2. No lymphadenopathy. 3. Postsurgical change consistent RIGHT hemicolectomy and hysterectomy 4. A fem-fem arterial bypass noted.   03/12/2017 Mammogram   MM Right Breast 03/12/17 IMPRESSION: No mammographic or sonographic evidence of malignancy. No correlate identified for the abnormal enhancement seen within the right nipple on MRI of 02/27/2017.    04/02/2017 Pathology Results   Surgical Pathology of Nipple Biopsy: 04/02/17 Diagnosis Nipple Biopsy, Right - BENIGN NIPPLE TISSUE. - NO ATYPIA OR MALIGNANCY.    11/13/2017 Imaging   1. No findings of recurrent malignancy in the abdomen or pelvis. 2. Other imaging findings of potential clinical significance: Chronic AVN of both femoral heads without contour abnormality. Lumbar impingement at L4-5 and L5-S1. Aortic Atherosclerosis (ICD10-I70.0). 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 Partial right hemicolectomy. Hysterectomy.   03/22/2018 Mammogram   Screening mammogram IMPRESSION No evidence of malignancy.     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   06/06/2019 Imaging   1. Tiny bilateral pulmonary nodules stable since 11/12/2018. Continued close attention on follow-up recommended. 2. Incompletely visualized upper abdominal lymphadenopathy better characterized on the abdomen CT from 2 weeks ago. 3.  Aortic Atherosclerois (ICD10-170.0) 4.  Emphysema. ([id number])   06/07/2019 Procedure   Technically successful CT guided core needle biopsy of indeterminate peritoneal  nodule within the right lower  abdomen/pelvis.   06/07/2019 Pathology Results   Soft Tissue Needle Core Biopsy, peritoneal nodule within right lower abdomen - METASTATIC CARCINOMA CONSISTENT WITH PATIENT'S CLINICAL HISTORY OF PRIMARY OVARIAN CARCINOMA. SEE NOTE Diagnosis Note Immunohistochemical stains show that the tumor cells are positive for CK7, ER and PAX 8; and negative for CK20 and CDX2. This immunostaining profile is consistent with above diagnosis.   06/16/2019 Tumor Marker   Patient's tumor was tested for the following markers: CA-125 Results of the tumor marker test revealed 499   06/20/2019 - 11/14/2019 Chemotherapy   The patient had carboplatin and taxol   07/11/2019 Tumor Marker   Patient's tumor was tested for the following markers: CA-125 Results of the tumor marker test revealed 438   08/05/2019 Tumor Marker   Patient's tumor was tested for the following markers: CA-125 Results of the tumor marker test revealed 272.   08/25/2019 Imaging   Ct chest, abdomen and pelvis 1. Slight interval decrease in size of a right lower quadrant peritoneal nodule measuring 1.7 x 1.6 cm, previously 2.2 x 2.2 cm (series 2, image 94, series 5, image 58). Findings are consistent with treatment response.   2. No significant change in ill-defined soft tissue about the sigmoid colon in the hysterectomy bed (series 2, image 101).   3.  Status post hysterectomy.   4. No evidence of metastatic disease in the chest. Stable benign small pulmonary nodules.   5.  Coronary artery disease.  Aortic Atherosclerosis (ICD10-I70.0).     08/26/2019 Tumor Marker   Patient's tumor was tested for the following markers: CA-125 Results of the tumor marker test revealed 189.   09/26/2019 Tumor Marker   Patient's tumor was tested for the following markers: CA-125 Results of the tumor marker test revealed 137   10/03/2019 Tumor Marker   Patient's tumor was tested for the following markers: CA-125 Results of the tumor marker test revealed  137.   10/31/2019 Tumor Marker   Patient's tumor was tested for the following markers: CA-125 Results of the tumor marker test revealed 101   11/29/2019 Imaging   1. Right lower quadrant peritoneal implant is decreased. Stable irregular nodular pelvic peritoneal thickening superior to the vaginal cuff. 2. No new or progressive metastatic disease. No abdominopelvic lymphadenopathy. 3.  Aortic Atherosclerosis (ICD10-I70.0).   12/15/2019 -  Chemotherapy   The patient had Lynparza for chemotherapy treatment.     12/23/2019 Tumor Marker   Patient's tumor was tested for the following markers: CA-125 Results of the tumor marker test revealed 55.9   01/02/2020 Tumor Marker   Patient's tumor was tested for the following markers: CA-125. Results of the tumor marker test revealed 58.6   02/03/2020 Tumor Marker   Patient's tumor was tested for the following markers: CA-125 Results of the tumor marker test revealed 52.2   02/24/2020 Tumor Marker   Patient's tumor was tested for the following markers: CA-125 Results of the tumor marker test revealed 64.8   04/19/2020 Imaging   1. Continued slight interval decrease in size of a soft tissue nodule in the right paracolic gutter, consistent with treatment response. 2. Soft tissue in the hysterectomy bed near the distal sigmoid colon and rectum may be slightly decreased in size, difficult to measure due to configuration but apparently diminished in size, also consistent with treatment response. 3. Status post hysterectomy and oophorectomy as well as right colon resection. 4. No evidence of new metastatic disease in the abdomen or pelvis. 5.  Redemonstrated long segment occlusion of the left common, internal, and external iliac arteries. Status post femoral-femoral bypass grafting with reconstitution of the left common femoral artery. Aortic Atherosclerosis (ICD10-I70.0).   08/15/2020 Tumor Marker   Patient's tumor was tested for the following markers:  CA-125 Results of the tumor marker test revealed 47.1   10/22/2020 Imaging   1. Reduced size of the nodule along the right paracolic gutter, currently 0.6 by 1.2 cm, previously 0.7 by 1.4 cm on 04/19/2020 and previously 0.9 by 1.5 cm on 11/29/2019. Appearance compatible with treatment response. 2. Further mild reduction in size of the bandlike density along the rectosigmoid mesentery, probably from scarring or effectively treated tumor. 3. Prominent stool throughout the colon favors constipation.  4. Stable chronic occlusion of the left iliac arteries, with a patent fem-fem bypass. 5. 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 6. Lower lumbar spondylosis and degenerative disc disease causing impingement at L4-5 and L5-S1. 7. Aortic atherosclerosis.       REVIEW OF SYSTEMS:   Constitutional: Denies fevers, chills or abnormal weight loss Eyes: Denies blurriness of vision Ears, nose, mouth, throat, and face: Denies mucositis or sore throat Respiratory: Denies cough, dyspnea or wheezes Cardiovascular: Denies palpitation, chest discomfort or lower extremity swelling Skin: Denies abnormal skin rashes Lymphatics: Denies new lymphadenopathy or easy bruising Neurological:Denies numbness, tingling or new weaknesses Behavioral/Psych: Mood is stable, no new changes  All other systems were reviewed with the patient and are negative.  I have reviewed the past medical history, past surgical history, social history and family history with the patient and they are unchanged from previous note.  ALLERGIES:  is allergic to other, demerol [meperidine], morphine and related, oxycodone, penicillins, strawberry flavor, tylenol [acetaminophen], and ibuprofen.  MEDICATIONS:  Current Outpatient Medications  Medication Sig Dispense Refill   acetaminophen (TYLENOL) 325 MG tablet Take 325 mg by mouth as needed.     docusate sodium  100 MG CAPS Take 100 mg by mouth 2 (two) times daily as needed for mild constipation. 10 capsule 0   HYDROcodone-acetaminophen (NORCO/VICODIN) 5-325 MG tablet Take 1 tablet by mouth every 6 (six) hours as needed for moderate pain. 60 tablet 0   lidocaine (LIDODERM) 5 % PLACE 1 PATCH ONTO THE SKIN EVERY DAY.REMOVE AND DISCARD PATCH WITHIN 12 HOURS OR AS DIRECTED BY PRESCRIBER 30 patch 1   LORazepam (ATIVAN) 0.5 MG tablet Take 1 tablet (0.5 mg total) by mouth 3 (three) times daily as needed for anxiety. 10 tablet 0   losartan (COZAAR) 50 MG tablet Take 1 tablet (50 mg total) by mouth daily. 90 tablet 3   olaparib (LYNPARZA) 100 MG tablet Take 2 tablets (200 mg total) by mouth 2 (two) times daily. Swallow whole. May take with food to decrease nausea and vomiting. 120 tablet 11   ondansetron (ZOFRAN) 8 MG tablet TAKE 1 TABLET(8 MG) BY MOUTH EVERY 8 HOURS AS NEEDED FOR NAUSEA OR VOMITING 90 tablet 3   pregabalin (LYRICA) 200 MG capsule Take 1 capsule (200 mg total) by mouth every 8 (eight) hours. Please give patient brand name Lyrica 270 capsule 4   prochlorperazine (COMPAZINE) 10 MG tablet TAKE 1 TABLET(10 MG) BY MOUTH EVERY 6 HOURS AS NEEDED FOR NAUSEA OR VOMITING 90 tablet 1   No current facility-administered medications for this visit.    PHYSICAL EXAMINATION: ECOG PERFORMANCE STATUS: 1 - Symptomatic but completely ambulatory  Vitals:   10/23/20 1306  BP: [name])  161/95  Pulse: 75  Resp: 18  Temp: (!) 97.4 F (36.3 C)  SpO2: 100%   Filed Weights   10/23/20 1306  Weight: 152 lb 3.2 oz (69 kg)    GENERAL:alert, no distress and comfortable NEURO: alert & oriented x 3 with fluent speech, no focal motor/sensory deficits  LABORATORY DATA:  I have reviewed the data as listed    Component Value Date/Time   NA 143 08/15/2020 1121   NA 141 10/07/2017 1436   K 3.8 08/15/2020 1121   K 4.4 10/07/2017 1436   CL 106 08/15/2020 1121   CO2 31 08/15/2020 1121   CO2 26 10/07/2017 1436    GLUCOSE 71 08/15/2020 1121   GLUCOSE 85 10/07/2017 1436   BUN 17 08/15/2020 1121   BUN 14.9 10/07/2017 1436   CREATININE 1.10 (H) 10/22/2020 1356   CREATININE 0.94 08/15/2020 1121   CREATININE 1.0 10/07/2017 1436   CALCIUM 9.2 08/15/2020 1121   CALCIUM 9.5 10/07/2017 1436   PROT 7.2 08/15/2020 1121   PROT 7.8 10/07/2017 1436   ALBUMIN 4.1 08/15/2020 1121   ALBUMIN 3.9 10/07/2017 1436   AST 14 (L) 08/15/2020 1121   AST 15 10/07/2017 1436   ALT 9 08/15/2020 1121   ALT 7 10/07/2017 1436   ALKPHOS 74 08/15/2020 1121   ALKPHOS 81 10/07/2017 1436   BILITOT 0.4 08/15/2020 1121   BILITOT 0.54 10/07/2017 1436   GFRNONAA >60 08/15/2020 1121   GFRAA >60 08/15/2020 1121    No results found for: SPEP, UPEP  Lab Results  Component Value Date   WBC 4.0 10/23/2020   NEUTROABS 1.8 10/23/2020   HGB 13.1 10/23/2020   HCT 35.7 (L) 10/23/2020   MCV 108.5 (H) 10/23/2020   PLT 110 (L) 10/23/2020      Chemistry      Component Value Date/Time   NA 143 08/15/2020 1121   NA 141 10/07/2017 1436   K 3.8 08/15/2020 1121   K 4.4 10/07/2017 1436   CL 106 08/15/2020 1121   CO2 31 08/15/2020 1121   CO2 26 10/07/2017 1436   BUN 17 08/15/2020 1121   BUN 14.9 10/07/2017 1436   CREATININE 1.10 (H) 10/22/2020 1356   CREATININE 0.94 08/15/2020 1121   CREATININE 1.0 10/07/2017 1436      Component Value Date/Time   CALCIUM 9.2 08/15/2020 1121   CALCIUM 9.5 10/07/2017 1436   ALKPHOS 74 08/15/2020 1121   ALKPHOS 81 10/07/2017 1436   AST 14 (L) 08/15/2020 1121   AST 15 10/07/2017 1436   ALT 9 08/15/2020 1121   ALT 7 10/07/2017 1436   BILITOT 0.4 08/15/2020 1121   BILITOT 0.54 10/07/2017 1436       RADIOGRAPHIC STUDIES: I have personally reviewed the radiological images as listed and agreed with the findings in the report. CT ABDOMEN PELVIS W CONTRAST  Result Date: 10/22/2020 CLINICAL DATA:  Restaging of colon cancer and ovarian cancer. Right hemicolectomy 09/18/2014, hysterectomy and  oophorectomy 12/26/2014. EXAM: CT ABDOMEN AND PELVIS WITH CONTRAST TECHNIQUE: Multidetector CT imaging of the abdomen and pelvis was performed using the standard protocol following bolus administration of intravenous contrast. CONTRAST:  160m OMNIPAQUE IOHEXOL 300 MG/ML  SOLN COMPARISON:  Multiple exams, including 04/19/2020 FINDINGS: Lower chest: Descending thoracic aortic atherosclerotic vascular disease. Hepatobiliary: Unremarkable Pancreas: Unremarkable Spleen: Unremarkable Adrenals/Urinary Tract: Unremarkable Stomach/Bowel: Right hemicolectomy. Prominent stool throughout the colon favors constipation. No dilated bowel. There is a band of density measuring 1.0 cm in thickness on axial image 58  of series 2 along the rectosigmoid mesentery, previously about 1.4 cm in thickness. This could be from scarring or effectively treated tumor. Vascular/Lymphatic: Stable chronic occlusion of the left iliac arteries, with a patent fem-fem bypass noted. Aortoiliac atherosclerotic vascular disease. Reproductive: Uterus absent.  No adnexal mass identified. Other: The nodule along the right paracolic gutter measures 0.6 by 1.2 cm on image 51 of series 2, previously 0.7 by 1.4 cm on 04/19/2020 and previously 0.9 by 1.5 cm on 11/29/2019 by my measurements. No new nodularity along the peritoneum is identified. 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 Large acetabular spurs are partially fragmented. No acute collapse or contour abnormality of the femoral heads. Lower lumbar spondylosis and degenerative disc disease causing impingement at L4-5 and L5-S1. IMPRESSION: 1. Reduced size of the nodule along the right paracolic gutter, currently 0.6 by 1.2 cm, previously 0.7 by 1.4 cm on 04/19/2020 and previously 0.9 by 1.5 cm on 11/29/2019. Appearance compatible with treatment response. 2. Further mild reduction in size of the bandlike density  along the rectosigmoid mesentery, probably from scarring or effectively treated tumor. 3. Prominent stool throughout the colon favors constipation. 4. Stable chronic occlusion of the left iliac arteries, with a patent fem-fem bypass. 5. 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 6. Lower lumbar spondylosis and degenerative disc disease causing impingement at L4-5 and L5-S1. 7. Aortic atherosclerosis. Aortic Atherosclerosis (ICD10-I70.0). Electronically Signed   By: Van Clines M.D.   On: 10/22/2020 14:47

## 2020-10-23 NOTE — Assessment & Plan Note (Signed)
CEA is within normal limits She has no signs of cancer recurrence on imaging study She has declines colonoscopy follow-up

## 2020-10-23 NOTE — Assessment & Plan Note (Signed)
Her pancytopenia is stable/improved We will continue current treatment without dose adjustment She is not symptomatic

## 2020-10-23 NOTE — Assessment & Plan Note (Signed)
Her blood pressure is intermittently elevated, likely attributable to anxiety Observe for now

## 2020-10-23 NOTE — Assessment & Plan Note (Signed)
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

## 2020-10-23 NOTE — Assessment & Plan Note (Signed)
She had recent flare of peripheral neuropathy of unknown etiology Her pain is better now She will continue taking Lyrica and her prescribed pain medicine

## 2020-10-23 NOTE — Assessment & Plan Note (Signed)
She has mild intermittent nausea I refill her prescription antiemetics

## 2020-10-24 LAB — CA 125: Cancer Antigen (CA) 125: 74.6 U/mL — ABNORMAL HIGH (ref 0.0–38.1)

## 2020-10-25 MED FILL — LYNPARZA 100 MG TAB: 100 | 30 days supply | Qty: 120 | Fill #10

## 2020-11-02 ENCOUNTER — Other Ambulatory Visit: Payer: Self-pay | Admitting: Hematology and Oncology

## 2020-11-02 ENCOUNTER — Telehealth: Payer: Self-pay

## 2020-11-02 MED ORDER — HYDROCODONE-ACETAMINOPHEN 5-325 MG PO TABS: 1.0000 | ORAL_TABLET | Freq: Four times a day (QID) | ORAL | 0 refills | Status: DC | PRN

## 2020-11-02 NOTE — Telephone Encounter (Signed)
She called and requested a refill on the Hydrocodone Rx.

## 2020-11-02 NOTE — Telephone Encounter (Signed)
Called and informed Rx sent. She verbalized understanding.

## 2020-11-02 NOTE — Telephone Encounter (Signed)
done

## 2020-11-12 ENCOUNTER — Telehealth: Payer: Self-pay

## 2020-11-12 NOTE — Telephone Encounter (Signed)
Oral Oncology Patient Advocate Encounter  Received notification from [place] that prior authorization for Catherine Rogers is required.  PA submitted on CoverMyMeds Key [id number] Status is pending  Oral Oncology Clinic will continue to follow.  [place] Patient [place] Phone [phone] Fax [phone] 11/12/2020 9:27 AM

## 2020-11-12 NOTE — Telephone Encounter (Signed)
Oral Oncology Patient Advocate Encounter  Prior Authorization for Catherine Rogers has been approved.    PA# [id number] Effective dates: 11/12/20 through 11/23/21   Oral Oncology Clinic will continue to follow.   [place] Patient [place] Phone [phone] Fax [phone] 11/12/2020 9:40 AM

## 2020-11-19 DIAGNOSIS — Z23 Encounter for immunization: Secondary | ICD-10-CM | POA: Diagnosis not present

## 2020-11-30 ENCOUNTER — Telehealth: Payer: Self-pay

## 2020-11-30 ENCOUNTER — Other Ambulatory Visit: Payer: Self-pay | Admitting: Hematology and Oncology

## 2020-11-30 MED ORDER — HYDROCODONE-ACETAMINOPHEN 5-325 MG PO TABS: 1.0000 | ORAL_TABLET | Freq: Four times a day (QID) | ORAL | 0 refills | Status: DC | PRN

## 2020-11-30 NOTE — Telephone Encounter (Signed)
She called and left a message. Requesting a refill on Hydrocodone Rx.

## 2020-11-30 NOTE — Telephone Encounter (Signed)
done

## 2020-11-30 NOTE — Telephone Encounter (Signed)
Called and left a message Rx sent. Ask her to call the office for questions. 

## 2020-12-06 ENCOUNTER — Other Ambulatory Visit: Payer: Self-pay | Admitting: *Deleted

## 2020-12-06 DIAGNOSIS — I739 Peripheral vascular disease, unspecified: Secondary | ICD-10-CM

## 2020-12-13 ENCOUNTER — Other Ambulatory Visit: Payer: Self-pay | Admitting: Hematology and Oncology

## 2020-12-19 MED FILL — LYNPARZA 100 MG TAB: 100 | 30 days supply | Qty: 120 | Fill #0

## 2020-12-20 ENCOUNTER — Other Ambulatory Visit: Payer: Self-pay | Admitting: Neurology

## 2020-12-20 ENCOUNTER — Telehealth: Payer: Self-pay | Admitting: Family

## 2020-12-20 NOTE — Telephone Encounter (Signed)
° ° °  Patient calling to report vaginal discharge for 4 days Seeking OTC medication  advice

## 2020-12-21 ENCOUNTER — Ambulatory Visit (HOSPITAL_COMMUNITY)
Admission: RE | Admit: 2020-12-21 | Discharge: 2020-12-21 | Disposition: A | Discharge status: Home / Self Care | Payer: 59 | Source: Ambulatory Visit | Attending: Physician Assistant | Admitting: Physician Assistant

## 2020-12-21 ENCOUNTER — Ambulatory Visit (INDEPENDENT_AMBULATORY_CARE_PROVIDER_SITE_OTHER): Payer: 59 | Admitting: Physician Assistant

## 2020-12-21 ENCOUNTER — Other Ambulatory Visit: Payer: Self-pay

## 2020-12-21 VITALS — BP 122/79 | HR 67 | Temp 97.8°F | Resp 20 | Ht 67.0 in | Wt 146.6 lb

## 2020-12-21 DIAGNOSIS — I739 Peripheral vascular disease, unspecified: Secondary | ICD-10-CM | POA: Insufficient documentation

## 2020-12-21 NOTE — Telephone Encounter (Signed)
With her history of ovarian cancer, she needs to call her oncologist. In the interim, she can use OTC Monistat.

## 2020-12-21 NOTE — Progress Notes (Signed)
Office Note     CC:  follow up Requesting Provider:  Marrian Salvage,*  HPI: Catherine Rogers is a 60 y.o. ([birth year]) female who presents for follow up of peripheral artery disease. She has history of right to left femoral- femoral bypass by Dr. Oneida Alar on 04/18/15. This was performed secondary to rest pain in her left foot. She has had continued pain in Left lower extremity even following revascularization. 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 At the time of her last visit her pain was manageable with lyrica and hydrocodone.  She has had recurrence of her ovarian cancer and has been undergoing chemotherapy over past several months. She says initially her leg pain was better but now it has returned. She continues to use lyrica and hydrocodone for her pain management. She says this regimen keeps her pain controlled well otherwise it is very disabling. She has really tried to increase her exercise routine and due to COVID her PT had stopped and so she has taken it upon herself to set up a gym at her home and she does her exercise every day. She says she does not have any pain when she is ambulating or doing her exercises. She does not have any worsening pain at rest or tissue loss  The pt is not on a statin for cholesterol management.  The pt is not on a daily aspirin.   Other AC: none The pt is on ARB for hypertension.   The pt is not diabetic.   Tobacco hx:  Smokes cigars and marijuanna  Past Medical History:  Diagnosis Date  . Anemia   . Anxiety   . Arthritis   . Asthma   . Cancer ([place]) dx'd 08/2014   colon and ovarian  . Complication of anesthesia    woke and saw long tube coming out of mouth . sat up then went back to  sleep  . Depression   . Ectopic pregnancy   . Family history of adverse reaction to anesthesia    sister on dialysis- hypotension   . Family history of ovarian cancer   . Head injury, closed, with brief LOC ([place])    had a cut- as a child  . Headache   . Hepatitis    being treated for Hep C  . History of blood transfusion    2005 approximately   . Hypertension    no meds now  . PAD (peripheral artery disease) ([place]) 04/18/2015  . Pelvic cyst   . PONV (postoperative nausea and vomiting)   . PTSD (post-traumatic stress disorder)     Past Surgical History:  Procedure Laterality Date  . ABDOMINAL HYSTERECTOMY    . BREAST BIOPSY Right 04/02/2017   Procedure: INCISIONAL BIOPSY RIGHT NIPPLE;  Surgeon: Coralie Keens, MD;  Location: [facility];  Service: General;  Laterality: Right;  . COLONOSCOPY    . ESOPHAGOGASTRODUODENOSCOPY N/A 09/17/2014   Procedure: ESOPHAGOGASTRODUODENOSCOPY (EGD);  Surgeon: Jeryl Columbia, MD;  [facility] ENDOSCOPY;  Service: Endoscopy;  Laterality: N/A;  . FEMORAL-FEMORAL BYPASS GRAFT Bilateral 04/18/2015   Procedure:  RIGHT COMMON FEMORAL TO LEFT COMMON  FEMORAL  ARTERY BYPASS WITH  8 MM HEMASHIELD GRAFT,RIGHT COMMON SUPERFICIAL FEMORAL ARTERY ENDARTERECTOMY;  Surgeon:  Elam Dutch, MD;  Location: [facility];  Service: Vascular;  Laterality: Bilateral;  . ILEOSTOMY Right 09/18/2014   Procedure: ILEOCOLOSTOMY;  Surgeon: Doreen Salvage, MD;  Location: [place];  Service: General;  Laterality: Right;  . LAPAROTOMY N/A 11/01/2014   Procedure: EXPLORATORY LAPAROTOMY;  Surgeon: Doreen Salvage, MD;  Location: [facility];  Service: General;  Laterality: N/A;  . LAPAROTOMY N/A 12/26/2014   Procedure:  LAPAROTOMY ;  Surgeon: Everitt Amber, MD;  Location: [facility];  Service: Gynecology;  Laterality: N/A;  . MASS EXCISION  11/01/2014   Procedure: EXCISION PELVIC MASS;  Surgeon: Doreen Salvage, MD;  Location: [place];  Service: General;;  . OOPHORECTOMY  2016  . PARTIAL COLECTOMY Right  09/18/2014   Procedure: RIGHT HEMI COLECTOMY;  Surgeon: Doreen Salvage, MD;  Location: [place];  Service: General;  Laterality: Right;  . ROBOTIC ASSISTED TOTAL HYSTERECTOMY WITH BILATERAL SALPINGO OOPHERECTOMY Right 12/26/2014   Procedure: EXPLORATORY LAPAROSCOPY  RIGHT SALPINGO OOPHORECTOMY OMENTECTOMY LYMPHADECTOMY ;  Surgeon: Everitt Amber, MD;  Location: [facility];  Service: Gynecology;  Laterality: Right;  . TUBAL LIGATION      Social History   Socioeconomic History  . Marital status: Married    Spouse name: Juanda Crumble  . Number of children: 6  . Years of education: 37  . Highest education level: High school graduate  Occupational History  . Occupation: Disability  Tobacco Use  . Smoking status: Current Some Day Smoker    Years: 10.00    Types: Cigars  . Smokeless tobacco: Never Used  . Tobacco comment: pack per month of cigars = 5  Vaping Use  . Vaping Use: Never used  Substance and Sexual Activity  . Alcohol use: No  . Drug use: Not Currently    Frequency: 5.0 times per week    Types: Marijuana    Comment: last smoked 09/22/17  . Sexual activity: Never  Other Topics Concern  . Not on file  Social History Narrative   Born and raised in [place], [place]. Currently resides in a house by herself. No pets. Fun: Read, shoot pool, poetry, Journal, knitting   Denies religious beliefs that would effect health care.    Lives at home with her son.   Right-handed.   No caffeine use.   Denies abuse and feels safe at home.    Social Determinants of Health   Financial Resource Strain: Not on file  Food Insecurity: Not on file  Transportation Needs: Not on file  Physical Activity: Not on file  Stress: Not on file  Social Connections: Not on file  Intimate Partner Violence: Not on file    Family History  Problem Relation Age of Onset  . Brain cancer Sister 12  . Lung cancer Brother 89  . Heart attack Mother   . Heart attack Father        27s  . Heart disease Father   . Heart attack  Maternal Uncle   . Diabetes Maternal Uncle   . Breast cancer Maternal Uncle   . Ovarian cancer Paternal Aunt        3 paternal aunts with ovarian cancer  . Heart attack Brother   . Diabetes Paternal Uncle     Current Outpatient Medications  Medication Sig Dispense Refill  . acetaminophen (TYLENOL) 325 MG tablet Take 325 mg by mouth as needed.    . docusate sodium 100 MG CAPS Take 100 mg by mouth 2 (two) times daily as needed for mild constipation. 10  capsule 0  . HYDROcodone-acetaminophen (NORCO/VICODIN) 5-325 MG tablet Take 1 tablet by mouth every 6 (six) hours as needed for moderate pain. 90 tablet 0  . lidocaine (LIDODERM) 5 % PLACE 1 PATCH ONTO THE SKIN EVERY DAY.REMOVE AND DISCARD PATCH WITHIN 12 HOURS OR AS DIRECTED BY PRESCRIBER 30 patch 1  . losartan (COZAAR) 50 MG tablet Take 1 tablet (50 mg total) by mouth daily. 90 tablet 3  . LYNPARZA 100 MG tablet TAKE 2 TABLETS (200 MG TOTAL) BY MOUTH 2 (TWO) TIMES DAILY. SWALLOW WHOLE. MAY TAKE WITH FOOD TO DECREASE NAUSEA AND VOMITING. 120 tablet 11  . ondansetron (ZOFRAN) 8 MG tablet TAKE 1 TABLET(8 MG) BY MOUTH EVERY 8 HOURS AS NEEDED FOR NAUSEA OR VOMITING 90 tablet 3  . pregabalin (LYRICA) 200 MG capsule Take 1 capsule (200 mg total) by mouth every 8 (eight) hours. Please give patient brand name Lyrica 270 capsule 4  . prochlorperazine (COMPAZINE) 10 MG tablet TAKE 1 TABLET(10 MG) BY MOUTH EVERY 6 HOURS AS NEEDED FOR NAUSEA OR VOMITING 90 tablet 1  . LORazepam (ATIVAN) 0.5 MG tablet Take 1 tablet (0.5 mg total) by mouth 3 (three) times daily as needed for anxiety. (Patient not taking: Reported on 12/21/2020) 10 tablet 0   No current facility-administered medications for this visit.    Allergies  Allergen Reactions  . Other Hives and Itching    Highly acidic foods "make me itch and break out" oranges, tomatoes  . Demerol [Meperidine] Nausea And Vomiting  . Morphine And Related Nausea And Vomiting  . Oxycodone Itching  . Penicillins  Other (See Comments)    Childhood allergy  . Strawberry Flavor Itching    Mrs Crain reports to [facility] RN CM she is allergic to Strawberries and anything strawberry flavored   . Tylenol [Acetaminophen] Nausea And Vomiting  . Ibuprofen Nausea And Vomiting     REVIEW OF SYSTEMS:  [X]  denotes positive finding, [ ]  denotes negative finding Cardiac  Comments:  Chest pain or chest pressure:    Shortness of breath upon exertion:    Short of breath when lying flat:    Irregular heart rhythm:        Vascular    Pain in calf, thigh, or hip brought on by ambulation:    Pain in feet at night that wakes you up from your sleep:     Blood clot in your veins:    Leg swelling:         Pulmonary    Oxygen at home:    Productive cough:     Wheezing:         Neurologic    Sudden weakness in arms or legs:     Sudden numbness in arms or legs:     Sudden onset of difficulty speaking or slurred speech:    Temporary loss of vision in one eye:     Problems with dizziness:         Gastrointestinal    Blood in stool:     Vomited blood:         Genitourinary    Burning when urinating:     Blood in urine:        Psychiatric    Major depression:         Hematologic    Bleeding problems:    Problems with blood clotting too easily:        Skin    Rashes or ulcers:  Constitutional    Fever or chills:      PHYSICAL EXAMINATION:  Vitals:   12/21/20 1425  BP: 122/79  Pulse: 67  Resp: 20  Temp: 97.8 F (36.6 C)  TempSrc: Temporal  SpO2: 100%  Weight: 146 lb 9.6 oz (66.5 kg)  Height: 5\' 7"  (1.702 m)    General:  WDWN in NAD; vital signs documented above Gait: Normal HENT: WNL, normocephalic Pulmonary: normal non-labored breathing , without wheezing Cardiac: regular HR, without  Murmurs without carotid bruit Abdomen: soft, NT, no masses. Scars present from multiple abdominal surgeries Vascular Exam/Pulses:Palpable pulse in fem- fem bypass graft  Right Left  Radial 2+  (normal) 2+ (normal)  Femoral 2+ (normal) 2+ (normal)  Popliteal 2+ (normal) 2+ (normal)  DP 2+ (normal) Not palpable  PT Not palpable Not palpable   Extremities: without ischemic changes, without Gangrene , without cellulitis; without open wounds; bilateral feet warm and well perfused Musculoskeletal: no muscle wasting or atrophy  Neurologic: A&O X 3;  No focal weakness or paresthesias are detected Psychiatric:  The pt has Normal affect.   Non-Invasive Vascular Imaging:   +-------+-----------+-----------+------------+------------+  ABI/TBIToday's ABIToday's TBIPrevious ABIPrevious TBI  +-------+-----------+-----------+------------+------------+  Right 0.58    0.41    0.68    0.51      +-------+-----------+-----------+------------+------------+  Left  0.46    0.00    0.69    0.45      +-------+-----------+-----------+------------+------------+     ASSESSMENT/PLAN:: 60 y.o. female here for follow up for peripheral artery disease. She has history of right to left femoral- femoral bypass by Dr. Oneida Alar on 04/18/15. This was performed secondary to rest pain in her left foot. She has had continued pain in Left lower extremity even following revascularization. Her pain is multifactorial and a lot is neuropathic in nature. Her symptoms have not changed from her prior visits. She continues to use lyrica and hydrocodone for pain management. She is requesting a referral to pain management today because she says she no longer is getting refills for her medications. Her non invasive studies today show significant decrease in ABI and TBI on the left. On exam her bypass is patent and her left leg is warm and well perfused. She does not presently have rest pain or tissue loss. I have encouraged her to continue her exercise regimen. I discussed with her following up in 3 months with repeat ABI. Advised her to call for earlier follow up if she has new or worsening  symptoms    Karoline Caldwell, PA-C [facility] [phone]  Clinic MD:  Dr. Donzetta Matters

## 2020-12-22 NOTE — Telephone Encounter (Signed)
Pt notified of PCP response & verb understanding. 

## 2020-12-24 ENCOUNTER — Other Ambulatory Visit: Payer: Self-pay

## 2020-12-24 DIAGNOSIS — I739 Peripheral vascular disease, unspecified: Secondary | ICD-10-CM

## 2020-12-27 ENCOUNTER — Telehealth: Payer: Self-pay

## 2020-12-27 ENCOUNTER — Telehealth: Payer: Self-pay | Admitting: Neurology

## 2020-12-27 NOTE — Telephone Encounter (Signed)
Pharmacy: [facility] [phone]

## 2020-12-27 NOTE — Telephone Encounter (Signed)
Pt. states she's been out of Lyrica since last week & she is pain. She is requesting a refill until she is able to get to a pain doctor. Please advise.

## 2020-12-27 NOTE — Telephone Encounter (Signed)
Per our records, she should have another refill on file at the pharmacy. I called [facility] and they confirmed this information and will get the prescription ready for pick up. I called and left a message for the patient with this update.

## 2020-12-27 NOTE — Telephone Encounter (Signed)
Patient called to ask about pain clinic referral and lyrica. Jeani Hawking confirmed with the [facility] pain clinic that the referral was in and that patient was at the top of the list. Advised patient of this and to call Dr. Krista Blue for a Lyrica refill to make it to the pain management appt. Patient verbalizes understanding.

## 2020-12-28 ENCOUNTER — Encounter: Payer: Self-pay | Admitting: Physical Medicine and Rehabilitation

## 2021-01-04 ENCOUNTER — Telehealth: Payer: Self-pay

## 2021-01-04 ENCOUNTER — Other Ambulatory Visit: Payer: Self-pay | Admitting: Hematology and Oncology

## 2021-01-04 MED ORDER — HYDROCODONE-ACETAMINOPHEN 5-325 MG PO TABS: 1.0000 | ORAL_TABLET | Freq: Four times a day (QID) | ORAL | 0 refills | Status: DC | PRN

## 2021-01-04 NOTE — Telephone Encounter (Signed)
done

## 2021-01-04 NOTE — Telephone Encounter (Signed)
She called and left a message requesting a refill on Hydrocodone Rx.

## 2021-01-04 NOTE — Telephone Encounter (Signed)
Called and left below message Rx sent. Ask her to call the office back if needed.

## 2021-01-17 MED FILL — LYNPARZA 100 MG TAB: 100 | 30 days supply | Qty: 120 | Fill #1

## 2021-01-21 ENCOUNTER — Encounter: Payer: Self-pay | Admitting: Hematology and Oncology

## 2021-01-21 ENCOUNTER — Inpatient Hospital Stay: Payer: 59

## 2021-01-21 ENCOUNTER — Inpatient Hospital Stay: Payer: 59 | Attending: Hematology and Oncology | Admitting: Hematology and Oncology

## 2021-01-21 ENCOUNTER — Other Ambulatory Visit: Payer: Self-pay | Admitting: Hematology and Oncology

## 2021-01-21 ENCOUNTER — Other Ambulatory Visit: Payer: Self-pay

## 2021-01-21 DIAGNOSIS — G893 Neoplasm related pain (acute) (chronic): Secondary | ICD-10-CM | POA: Diagnosis not present

## 2021-01-21 DIAGNOSIS — Z79899 Other long term (current) drug therapy: Secondary | ICD-10-CM | POA: Insufficient documentation

## 2021-01-21 DIAGNOSIS — C562 Malignant neoplasm of left ovary: Secondary | ICD-10-CM

## 2021-01-21 DIAGNOSIS — R110 Nausea: Secondary | ICD-10-CM | POA: Diagnosis not present

## 2021-01-21 DIAGNOSIS — C182 Malignant neoplasm of ascending colon: Secondary | ICD-10-CM

## 2021-01-21 DIAGNOSIS — Z9221 Personal history of antineoplastic chemotherapy: Secondary | ICD-10-CM | POA: Insufficient documentation

## 2021-01-21 DIAGNOSIS — C786 Secondary malignant neoplasm of retroperitoneum and peritoneum: Secondary | ICD-10-CM | POA: Insufficient documentation

## 2021-01-21 DIAGNOSIS — R634 Abnormal weight loss: Secondary | ICD-10-CM | POA: Insufficient documentation

## 2021-01-21 DIAGNOSIS — Z85038 Personal history of other malignant neoplasm of large intestine: Secondary | ICD-10-CM | POA: Insufficient documentation

## 2021-01-21 DIAGNOSIS — G609 Hereditary and idiopathic neuropathy, unspecified: Secondary | ICD-10-CM | POA: Insufficient documentation

## 2021-01-21 DIAGNOSIS — Z90710 Acquired absence of both cervix and uterus: Secondary | ICD-10-CM | POA: Diagnosis not present

## 2021-01-21 DIAGNOSIS — I10 Essential (primary) hypertension: Secondary | ICD-10-CM | POA: Diagnosis not present

## 2021-01-21 LAB — CMP (CANCER CENTER ONLY)
ALT: 6 U/L (ref 0–44)
AST: 15 U/L (ref 15–41)
Albumin: 4.2 g/dL (ref 3.5–5.0)
Alkaline Phosphatase: 70 U/L (ref 38–126)
Anion gap: 8 (ref 5–15)
BUN: 12 mg/dL (ref 6–20)
CO2: 26 mmol/L (ref 22–32)
Calcium: 9.4 mg/dL (ref 8.9–10.3)
Chloride: 105 mmol/L (ref 98–111)
Creatinine: 1.07 mg/dL — ABNORMAL HIGH (ref 0.44–1.00)
GFR, Estimated: 60 mL/min — ABNORMAL LOW (ref >60)
Glucose, Bld: 99 mg/dL (ref 70–99)
Potassium: 3.6 mmol/L (ref 3.5–5.1)
Sodium: 139 mmol/L (ref 135–145)
Total Bilirubin: 0.4 mg/dL (ref 0.3–1.2)
Total Protein: 7.4 g/dL (ref 6.5–8.1)

## 2021-01-21 LAB — CBC WITH DIFFERENTIAL (CANCER CENTER ONLY)
Abs Immature Granulocytes: 0.01 10*3/uL (ref 0.00–0.07)
Basophils Absolute: 0 10*3/uL (ref 0.0–0.1)
Basophils Relative: 0 %
Eosinophils Absolute: 0.1 10*3/uL (ref 0.0–0.5)
Eosinophils Relative: 1 %
HCT: 33 % — ABNORMAL LOW (ref 36.0–46.0)
Hemoglobin: 12.1 g/dL (ref 12.0–15.0)
Immature Granulocytes: 0 %
Lymphocytes Relative: 52 %
Lymphs Abs: 3 10*3/uL (ref 0.7–4.0)
MCH: 40.6 pg — ABNORMAL HIGH (ref 26.0–34.0)
MCHC: 36.7 g/dL — ABNORMAL HIGH (ref 30.0–36.0)
MCV: 110.7 fL — ABNORMAL HIGH (ref 80.0–100.0)
Monocytes Absolute: 0.5 10*3/uL (ref 0.1–1.0)
Monocytes Relative: 8 %
Neutro Abs: 2.3 10*3/uL (ref 1.7–7.7)
Neutrophils Relative %: 39 %
Platelet Count: 140 10*3/uL — ABNORMAL LOW (ref 150–400)
RBC: 2.98 MIL/uL — ABNORMAL LOW (ref 3.87–5.11)
RDW: 14.2 % (ref 11.5–15.5)
WBC Count: 5.9 10*3/uL (ref 4.0–10.5)
nRBC: 0 % (ref 0.0–0.2)

## 2021-01-21 LAB — CEA (IN HOUSE-CHCC): CEA (CHCC-In House): 1.07 ng/mL (ref 0.00–5.00)

## 2021-01-21 NOTE — Assessment & Plan Note (Signed)
She has slight acute on chronic abdominal pain As above, I plan to order CT imaging for further evaluation

## 2021-01-21 NOTE — Assessment & Plan Note (Signed)
I am concerned about her recent acute on chronic abdominal pain I will order CT imaging next week for further follow-up

## 2021-01-21 NOTE — Assessment & Plan Note (Signed)
The patient has declined colonoscopy follow-up for colon cancer Her recent CEA was within normal limits

## 2021-01-21 NOTE — Progress Notes (Signed)
[place] OFFICE PROGRESS NOTE  Patient Care Team: Marrian Salvage, [place] as PCP - General (Internal Medicine) Judeth Horn, MD as Consulting Physician (General Surgery) Nat Math, MD as Referring Physician (Obstetrics and Gynecology) Elam Dutch, MD as Consulting Physician (Vascular Surgery) Heath Lark, MD as Consulting Physician (Hematology and Oncology)  ASSESSMENT & PLAN:  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 [facility]) I am concerned about her recent acute on chronic abdominal pain I will order CT imaging next week for further follow-up  Cancer of right colon [facility]) The patient has declined colonoscopy follow-up for colon cancer Her recent CEA was within normal limits  Cancer associated pain She has slight acute on chronic abdominal pain As above, I plan to order CT imaging for further evaluation   No orders of the defined types were placed in this encounter.   All questions were answered. The patient knows to call the clinic with any problems, questions or concerns. The total time spent in the appointment was 20 minutes encounter with patients including review of chart and various tests results, discussions about plan of care and coordination of care plan   Heath Lark, MD 01/21/2021 3:22 PM  INTERVAL HISTORY: Please see below for problem oriented charting. She returns for further follow-up She has some new onset left lower quadrant pain that comes and goes over the past month She has some mild occasional nausea and loss of weight Denies constipation No recent bloating Denies vaginal bleeding  SUMMARY OF ONCOLOGIC HISTORY: Oncology History Overview Note  BRCA1 positive   Cancer of right colon ([place])  09/16/2014 Imaging   CT abd/pel:  a 3.8cm cecal mass with associated ileocecal intussusception, and a 9.1cm solid and cystic mass in the left pelvis,   09/18/2014 Surgery   right hemicolectomy and terminal ileoectomy   09/18/2014 Pathologic Stage    pT3pN1Mx, tumor extends into pericolonic soft tissue and is less than 62m from the serosal surface, LVI 8-), PNI (-), all 23 node negative, one soft tissue tumor deposit, surgical margins negative.    09/18/2014 Initial Diagnosis   Adenocarcinoma of right colon   08/06/2016 Imaging   CT chest/abd/pelvis with contrast IMPRESSION: 1. No recurrent malignancy identified. 2. 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 On the prior exam from 09/13/2015 there was back flow of contrast in the left external iliac artery to supply the left internal iliac artery ; this back flow of contrast is no longer present in the left external iliac artery is occluded. 3. Several pulmonary nodules at or below 4 mm diameter, unchanged. 4. 3 mm hypodensity posteriorly in the body of the pancreas, not well seen previously and subtle today, likely incidental, but merits observation. 5. 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 Prominent degenerative spurring of both acetabula. 6. Lumbar spondylosis and degenerative disc disease causing prominent impingement at L4-5 and mild impingement at L5-S1.   03/08/2017 Imaging   CT CAP W Contrast 03/08/17 IMPRESSION: Chest Impression: 1. Stable small pulmonary nodules within LEFT lung. 2. No mediastinal lymphadenopathy.  Abdomen / Pelvis Impression: 1. No evidence of metastatic colorectal cancer or ovarian cancer within the abdomen pelvis. 2. No lymphadenopathy. 3. Postsurgical change consistent RIGHT hemicolectomy and hysterectomy 4. A fem-fem arterial bypass noted.   11/13/2017 Imaging   CT AP w contrast IMPRESSION: 1. No findings of recurrent malignancy in the abdomen or pelvis. 2. Other imaging findings of potential clinical significance:Chronic AVN of  both femoral heads without contour abnormality. Lumbar impingement at L4-5 and L5-S1. Aortic Atherosclerosis (ICD10-I70.0). 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 Partial right hemicolectomy. Hysterectomy.   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 ([place])  10/09/2014 Imaging   CT of the abdomen and pelvis showed a 9.1 cm solid and cystic mass in the left pelvis concerning for malignancy. This was discovered during her workup for colon cancer.   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 Tumor size 3.7 cm, no lymph nodes removed. T1cNx. Tumor cells are positive for cytokeratin 7 and estrogen receptor.   11/01/2014 Surgery   B/l Salpingo-oophorectomy.   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 Stable small pulmonary nodules within LEFT lung. 2. No mediastinal lymphadenopathy. Abdomen / Pelvis Impression: 1. No evidence of metastatic colorectal cancer or ovarian cancer within the abdomen pelvis. 2. No lymphadenopathy. 3. Postsurgical change consistent RIGHT hemicolectomy and hysterectomy 4. A fem-fem arterial bypass noted.   03/12/2017 Mammogram   MM Right Breast 03/12/17 IMPRESSION: No mammographic or sonographic evidence of malignancy. No correlate identified for the abnormal enhancement seen within the right nipple on MRI of 02/27/2017.    04/02/2017 Pathology Results   Surgical Pathology of Nipple Biopsy: 04/02/17 Diagnosis Nipple Biopsy, Right - BENIGN NIPPLE TISSUE. - NO ATYPIA OR MALIGNANCY.    11/13/2017 Imaging   1. No findings of recurrent malignancy in the abdomen or pelvis. 2. Other imaging findings of potential clinical significance: Chronic AVN of both femoral heads without contour  abnormality. Lumbar impingement at L4-5 and L5-S1. Aortic Atherosclerosis (ICD10-I70.0). 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 Partial right hemicolectomy. Hysterectomy.   03/22/2018 Mammogram   Screening mammogram IMPRESSION No evidence of malignancy.     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   06/06/2019 Imaging   1. Tiny bilateral pulmonary nodules stable since 11/12/2018. Continued close attention on follow-up recommended. 2. Incompletely visualized upper abdominal lymphadenopathy better characterized on the abdomen CT from 2 weeks ago. 3.  Aortic Atherosclerois (ICD10-170.0) 4.  Emphysema. (THY38-O87.9)   06/07/2019 Procedure   Technically successful CT guided core needle biopsy of indeterminate peritoneal nodule within the right lower abdomen/pelvis.   06/07/2019 Pathology Results   Soft Tissue Needle Core Biopsy, peritoneal nodule within right lower abdomen - METASTATIC CARCINOMA CONSISTENT WITH PATIENT'S CLINICAL HISTORY OF PRIMARY OVARIAN CARCINOMA. SEE NOTE Diagnosis Note Immunohistochemical stains show that the tumor cells are positive for CK7, ER and PAX 8; and negative for CK20 and CDX2. This immunostaining profile is consistent with above diagnosis.   06/16/2019 Tumor Marker   Patient's tumor was tested for the following markers: CA-125 Results of the tumor marker test revealed 499   06/20/2019 - 11/14/2019 Chemotherapy   The patient had carboplatin and taxol   07/11/2019 Tumor Marker   Patient's tumor was tested  for the following markers: CA-125 Results of the tumor marker test revealed 438   08/05/2019 Tumor Marker   Patient's tumor was tested for the following markers:  CA-125 Results of the tumor marker test revealed 272.   08/25/2019 Imaging   Ct chest, abdomen and pelvis 1. Slight interval decrease in size of a right lower quadrant peritoneal nodule measuring 1.7 x 1.6 cm, previously 2.2 x 2.2 cm (series 2, image 94, series 5, image 58). Findings are consistent with treatment response.   2. No significant change in ill-defined soft tissue about the sigmoid colon in the hysterectomy bed (series 2, image 101).   3.  Status post hysterectomy.   4. No evidence of metastatic disease in the chest. Stable benign small pulmonary nodules.   5.  Coronary artery disease.  Aortic Atherosclerosis (ICD10-I70.0).     08/26/2019 Tumor Marker   Patient's tumor was tested for the following markers: CA-125 Results of the tumor marker test revealed 189.   09/26/2019 Tumor Marker   Patient's tumor was tested for the following markers: CA-125 Results of the tumor marker test revealed 137   10/03/2019 Tumor Marker   Patient's tumor was tested for the following markers: CA-125 Results of the tumor marker test revealed 137.   10/31/2019 Tumor Marker   Patient's tumor was tested for the following markers: CA-125 Results of the tumor marker test revealed 101   11/29/2019 Imaging   1. Right lower quadrant peritoneal implant is decreased. Stable irregular nodular pelvic peritoneal thickening superior to the vaginal cuff. 2. No new or progressive metastatic disease. No abdominopelvic lymphadenopathy. 3.  Aortic Atherosclerosis (ICD10-I70.0).   12/15/2019 -  Chemotherapy   The patient had Lynparza for chemotherapy treatment.     12/23/2019 Tumor Marker   Patient's tumor was tested for the following markers: CA-125 Results of the tumor marker test revealed 55.9   01/02/2020 Tumor Marker   Patient's tumor was tested for the following markers: CA-125. Results of the tumor marker test revealed 58.6   02/03/2020 Tumor Marker   Patient's tumor was tested for the following  markers: CA-125 Results of the tumor marker test revealed 52.2   02/24/2020 Tumor Marker   Patient's tumor was tested for the following markers: CA-125 Results of the tumor marker test revealed 64.8   04/19/2020 Imaging   1. Continued slight interval decrease in size of a soft tissue nodule in the right paracolic gutter, consistent with treatment response. 2. Soft tissue in the hysterectomy bed near the distal sigmoid colon and rectum may be slightly decreased in size, difficult to measure due to configuration but apparently diminished in size, also consistent with treatment response. 3. Status post hysterectomy and oophorectomy as well as right colon resection. 4. No evidence of new metastatic disease in the abdomen or pelvis. 5. Redemonstrated long segment occlusion of the left common, internal, and external iliac arteries. Status post femoral-femoral bypass grafting with reconstitution of the left common femoral artery. Aortic Atherosclerosis (ICD10-I70.0).   08/15/2020 Tumor Marker   Patient's tumor was tested for the following markers: CA-125 Results of the tumor marker test revealed 47.1   10/22/2020 Imaging   1. Reduced size of the nodule along the right paracolic gutter, currently 0.6 by 1.2 cm, previously 0.7 by 1.4 cm on 04/19/2020 and previously 0.9 by 1.5 cm on 11/29/2019. Appearance compatible with treatment response. 2. Further mild reduction in size of the bandlike density along the rectosigmoid mesentery, probably from scarring or effectively treated tumor. 3. Prominent  stool throughout the colon favors constipation.  4. Stable chronic occlusion of the left iliac arteries, with a patent fem-fem bypass. 5. 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 6. Lower lumbar spondylosis and degenerative disc disease causing impingement at L4-5 and L5-S1. 7. Aortic atherosclerosis.     10/23/2020 Tumor Marker    Patient's tumor was tested for the following markers: CA-125 Results of the tumor marker test revealed 74.6     REVIEW OF SYSTEMS:   Constitutional: Denies fevers, chills  Eyes: Denies blurriness of vision Ears, nose, mouth, throat, and face: Denies mucositis or sore throat Respiratory: Denies cough, dyspnea or wheezes Cardiovascular: Denies palpitation, chest discomfort or lower extremity swelling Skin: Denies abnormal skin rashes Lymphatics: Denies new lymphadenopathy or easy bruising Neurological:Denies numbness, tingling or new weaknesses Behavioral/Psych: Mood is stable, no new changes  All other systems were reviewed with the patient and are negative.  I have reviewed the past medical history, past surgical history, social history and family history with the patient and they are unchanged from previous note.  ALLERGIES:  is allergic to other, demerol [meperidine], morphine and related, oxycodone, penicillins, strawberry flavor, tylenol [acetaminophen], and ibuprofen.  MEDICATIONS:  Current Outpatient Medications  Medication Sig Dispense Refill  . acetaminophen (TYLENOL) 325 MG tablet Take 325 mg by mouth as needed.    . docusate sodium 100 MG CAPS Take 100 mg by mouth 2 (two) times daily as needed for mild constipation. 10 capsule 0  . HYDROcodone-acetaminophen (NORCO/VICODIN) 5-325 MG tablet Take 1 tablet by mouth every 6 (six) hours as needed for moderate pain. 90 tablet 0  . lidocaine (LIDODERM) 5 % PLACE 1 PATCH ONTO THE SKIN EVERY DAY.REMOVE AND DISCARD PATCH WITHIN 12 HOURS OR AS DIRECTED BY PRESCRIBER 30 patch 1  . LORazepam (ATIVAN) 0.5 MG tablet Take 1 tablet (0.5 mg total) by mouth 3 (three) times daily as needed for anxiety. (Patient not taking: Reported on 12/21/2020) 10 tablet 0  . losartan (COZAAR) 50 MG tablet Take 1 tablet (50 mg total) by mouth daily. 90 tablet 3  . LYNPARZA 100 MG tablet TAKE 2 TABLETS (200 MG TOTAL) BY MOUTH 2 (TWO) TIMES DAILY. SWALLOW WHOLE.  MAY TAKE WITH FOOD TO DECREASE NAUSEA AND VOMITING. 120 tablet 11  . ondansetron (ZOFRAN) 8 MG tablet TAKE 1 TABLET(8 MG) BY MOUTH EVERY 8 HOURS AS NEEDED FOR NAUSEA OR VOMITING 90 tablet 3  . pregabalin (LYRICA) 200 MG capsule Take 1 capsule (200 mg total) by mouth every 8 (eight) hours. Please give patient brand name Lyrica 270 capsule 4  . prochlorperazine (COMPAZINE) 10 MG tablet TAKE 1 TABLET(10 MG) BY MOUTH EVERY 6 HOURS AS NEEDED FOR NAUSEA OR VOMITING 90 tablet 1   No current facility-administered medications for this visit.    PHYSICAL EXAMINATION: ECOG PERFORMANCE STATUS: 2 - Symptomatic, <50% confined to bed  Vitals:   01/21/21 1201  BP: (!) 157/90  Pulse: 75  Resp: 18  Temp: (!) 97 F (36.1 C)  SpO2: 100%   Filed Weights   01/21/21 1201  Weight: 143 lb (64.9 kg)    GENERAL:alert, no distress and comfortable SKIN: skin color, texture, turgor are normal, no rashes or significant lesions EYES: normal, Conjunctiva are pink and non-injected, sclera clear OROPHARYNX:no exudate, no erythema and lips, buccal mucosa, and tongue normal  NECK: supple, thyroid normal size, non-tender, without nodularity LYMPH:  no palpable lymphadenopathy in the cervical, axillary or  inguinal LUNGS: clear to auscultation and percussion with normal breathing effort HEART: regular rate & rhythm and no murmurs and no lower extremity edema ABDOMEN:abdomen soft, mild tenderness on left lower quadrant palpation but without rebound or guarding Musculoskeletal:no cyanosis of digits and no clubbing  NEURO: alert & oriented x 3 with fluent speech, no focal motor/sensory deficits  LABORATORY DATA:  I have reviewed the data as listed    Component Value Date/Time   NA 139 01/21/2021 1145   NA 141 10/07/2017 1436   K 3.6 01/21/2021 1145   K 4.4 10/07/2017 1436   CL 105 01/21/2021 1145   CO2 26 01/21/2021 1145   CO2 26 10/07/2017 1436   GLUCOSE 99 01/21/2021 1145   GLUCOSE 85 10/07/2017 1436   BUN  12 01/21/2021 1145   BUN 14.9 10/07/2017 1436   CREATININE 1.07 (H) 01/21/2021 1145   CREATININE 1.0 10/07/2017 1436   CALCIUM 9.4 01/21/2021 1145   CALCIUM 9.5 10/07/2017 1436   PROT 7.4 01/21/2021 1145   PROT 7.8 10/07/2017 1436   ALBUMIN 4.2 01/21/2021 1145   ALBUMIN 3.9 10/07/2017 1436   AST 15 01/21/2021 1145   AST 15 10/07/2017 1436   ALT 6 01/21/2021 1145   ALT 7 10/07/2017 1436   ALKPHOS 70 01/21/2021 1145   ALKPHOS 81 10/07/2017 1436   BILITOT 0.4 01/21/2021 1145   BILITOT 0.54 10/07/2017 1436   GFRNONAA 60 (L) 01/21/2021 1145   GFRAA >60 08/15/2020 1121    No results found for: SPEP, UPEP  Lab Results  Component Value Date   WBC 5.9 01/21/2021   NEUTROABS 2.3 01/21/2021   HGB 12.1 01/21/2021   HCT 33.0 (L) 01/21/2021   MCV 110.7 (H) 01/21/2021   PLT 140 (L) 01/21/2021      Chemistry      Component Value Date/Time   NA 139 01/21/2021 1145   NA 141 10/07/2017 1436   K 3.6 01/21/2021 1145   K 4.4 10/07/2017 1436   CL 105 01/21/2021 1145   CO2 26 01/21/2021 1145   CO2 26 10/07/2017 1436   BUN 12 01/21/2021 1145   BUN 14.9 10/07/2017 1436   CREATININE 1.07 (H) 01/21/2021 1145   CREATININE 1.0 10/07/2017 1436      Component Value Date/Time   CALCIUM 9.4 01/21/2021 1145   CALCIUM 9.5 10/07/2017 1436   ALKPHOS 70 01/21/2021 1145   ALKPHOS 81 10/07/2017 1436   AST 15 01/21/2021 1145   AST 15 10/07/2017 1436   ALT 6 01/21/2021 1145   ALT 7 10/07/2017 1436   BILITOT 0.4 01/21/2021 1145   BILITOT 0.54 10/07/2017 1436

## 2021-01-22 LAB — CA 125: Cancer Antigen (CA) 125: 825 U/mL — ABNORMAL HIGH (ref 0.0–38.1)

## 2021-01-28 ENCOUNTER — Ambulatory Visit (HOSPITAL_COMMUNITY)
Admission: RE | Admit: 2021-01-28 | Discharge: 2021-01-28 | Disposition: A | Discharge status: Home / Self Care | Payer: 59 | Source: Ambulatory Visit | Attending: Hematology and Oncology | Admitting: Hematology and Oncology

## 2021-01-28 ENCOUNTER — Other Ambulatory Visit: Payer: Self-pay

## 2021-01-28 ENCOUNTER — Encounter (HOSPITAL_COMMUNITY): Payer: Self-pay | Admitting: Radiology

## 2021-01-28 DIAGNOSIS — C562 Malignant neoplasm of left ovary: Secondary | ICD-10-CM | POA: Insufficient documentation

## 2021-01-28 DIAGNOSIS — C182 Malignant neoplasm of ascending colon: Secondary | ICD-10-CM | POA: Diagnosis not present

## 2021-01-28 MED ORDER — IOHEXOL 300 MG/ML  SOLN
100.0000 mL | Freq: Once | INTRAMUSCULAR | Status: AC | PRN
  Administered 2021-01-28: 100 mL via INTRAVENOUS

## 2021-01-29 ENCOUNTER — Encounter: Payer: Self-pay | Admitting: Hematology and Oncology

## 2021-01-29 ENCOUNTER — Other Ambulatory Visit: Payer: Self-pay

## 2021-01-29 ENCOUNTER — Inpatient Hospital Stay: Payer: 59 | Attending: Hematology and Oncology | Admitting: Hematology and Oncology

## 2021-01-29 VITALS — BP 114/92 | HR 66 | Temp 97.6°F | Resp 18 | Ht 67.0 in | Wt 140.8 lb

## 2021-01-29 DIAGNOSIS — Z9221 Personal history of antineoplastic chemotherapy: Secondary | ICD-10-CM | POA: Insufficient documentation

## 2021-01-29 DIAGNOSIS — Z90710 Acquired absence of both cervix and uterus: Secondary | ICD-10-CM | POA: Diagnosis not present

## 2021-01-29 DIAGNOSIS — I10 Essential (primary) hypertension: Secondary | ICD-10-CM | POA: Diagnosis not present

## 2021-01-29 DIAGNOSIS — C786 Secondary malignant neoplasm of retroperitoneum and peritoneum: Secondary | ICD-10-CM | POA: Insufficient documentation

## 2021-01-29 DIAGNOSIS — Z5111 Encounter for antineoplastic chemotherapy: Secondary | ICD-10-CM | POA: Diagnosis not present

## 2021-01-29 DIAGNOSIS — Z79899 Other long term (current) drug therapy: Secondary | ICD-10-CM | POA: Diagnosis not present

## 2021-01-29 DIAGNOSIS — C562 Malignant neoplasm of left ovary: Secondary | ICD-10-CM

## 2021-01-29 DIAGNOSIS — Z85038 Personal history of other malignant neoplasm of large intestine: Secondary | ICD-10-CM | POA: Insufficient documentation

## 2021-01-29 DIAGNOSIS — G609 Hereditary and idiopathic neuropathy, unspecified: Secondary | ICD-10-CM | POA: Insufficient documentation

## 2021-01-29 DIAGNOSIS — G893 Neoplasm related pain (acute) (chronic): Secondary | ICD-10-CM | POA: Diagnosis not present

## 2021-01-29 DIAGNOSIS — Z7189 Other specified counseling: Secondary | ICD-10-CM

## 2021-01-29 MED ORDER — ONDANSETRON HCL 8 MG PO TABS: 8.0000 mg | ORAL_TABLET | Freq: Three times a day (TID) | ORAL | 1 refills | Status: DC | PRN

## 2021-01-29 MED ORDER — HYDROCODONE-ACETAMINOPHEN 5-325 MG PO TABS: 1.0000 | ORAL_TABLET | Freq: Four times a day (QID) | ORAL | 0 refills | Status: DC | PRN

## 2021-01-29 MED ORDER — LIDOCAINE-PRILOCAINE 2.5-2.5 % EX CREA: TOPICAL_CREAM | CUTANEOUS | 3 refills | Status: DC

## 2021-01-29 MED ORDER — PROCHLORPERAZINE MALEATE 10 MG PO TABS: 10.0000 mg | ORAL_TABLET | Freq: Four times a day (QID) | ORAL | 1 refills | Status: DC | PRN

## 2021-01-29 NOTE — Assessment & Plan Note (Signed)
The patient is tearful I explained to her why we need to resume treatment with chemotherapy and she is in agreement I am hopeful we can get her back to complete response with chemo plus bevacizumab

## 2021-01-29 NOTE — Progress Notes (Signed)
DISCONTINUE OFF PATHWAY REGIMEN - Ovarian   [id number]:Carboplatin AUC=6 + Paclitaxel 175 mg/m2 q21 Days:   A cycle is every 21 days:     Paclitaxel      Carboplatin   **Always confirm dose/schedule in your pharmacy ordering system**  REASON: Other Reason PRIOR TREATMENT: Off Pathway: Carboplatin AUC=6 + Paclitaxel 175 mg/m2 q21 Days TREATMENT RESPONSE: Complete Response (CR)  START ON PATHWAY REGIMEN - Ovarian     A cycle is every 21 days:     Gemcitabine      Carboplatin   **Always confirm dose/schedule in your pharmacy ordering system**  Patient Characteristics: Recurrent or Progressive Disease, Third Line, Platinum Sensitive and ? 6 Months Since Last Platinum Therapy, BRCA Mutation Present, Prior PARP Inhibitor BRCA Mutation Status: Present ([place]) Therapeutic Status: Recurrent or Progressive Disease Line of Therapy: Third Line PARP Inhibitor Status: Prior PARP Inhibitor Intent of Therapy: Non-Curative / Palliative Intent, Discussed with Patient

## 2021-01-29 NOTE — Assessment & Plan Note (Signed)
She had history of hypertension Her blood pressure is okay She will continue close monitoring of blood pressure in anticipation for future with bevacizumab

## 2021-01-29 NOTE — Assessment & Plan Note (Signed)
I have reviewed recent blood work and CT imaging with the patient Unfortunately, she has cancer recurrence Her recent CEA level was normal I do not believe this represent recurrent metastatic gastric cancer This is most likely recurrent ovarian cancer She is symptomatic I reviewed the guidelines with her  We discussed the role of chemotherapy is of palliative intent, in accordance with NCCN guidelines, based on publication below: Int J Gynecol Cancer. 2005 May-Jun;15 Suppl 1:36-41. Combination therapy with gemcitabine and carboplatin in recurrent ovarian cancer. Pfisterer J1, [place] I, [place] A, Eisenhauer E; AGO-OVAR,; NCIC CTG; EORTC GCG.  Participants with recurrent platinum-sensitive ovarian cancer were randomly assigned to receive either gemcitabine-carboplatin or carboplatin every 21 days. The primary objective was to compare progression-free survival (PFS) between the groups. From September 1999 to April 2002, 356 patients (178 participants received gemcitabine-carboplatin, 178 received carboplatin only) were randomized to treatment. Patients received six cycles of either gemcitabine-carboplatin or carboplatin. With a median follow-up of 17 months, median PFS was 8.6 months for gemcitabine-carboplatin (95% confidence interval [CI] 7.9-9.7 months) and 5.8 months for carboplatin (95% CI 5.2-7.1 months; hazard ratio [HR] 0.72 [95% CI 0.58-0.90; P = 0.0032]). The response rate for the gemcitabine-carboplatin group was 47.2% (95% CI 39.9-54.5%) and 30.9% for carboplatin group (95% CI 24.1-37.7%; P = 0.0016). The HR for overall survival was 0.96 (95% CI 0.75-1.23; P = 0.7349). Patients treated with gemcitabine-carboplatin reported significantly faster palliation of abdominal symptoms and a significantly improved global quality of life. Gemcitabine-carboplatin treatment significantly improves the PFS of patients with platinum-sensitive recurrent ovarian cancer.  We discussed the role of  chemotherapy. The intent is of palliative intent.  We discussed some of the risks, benefits, side-effects of carboplatin & gemcitabine Based on recent FDA approval, I will also add bevacizumab  Some of the short term side-effects included, though not limited to, including weight loss, life threatening infections, risk of allergic reactions, hypertension, risk of blood clots need for transfusions of blood products, nausea, vomiting, change in bowel habits, loss of hair, admission to hospital for various reasons, and risks of death.   Long term side-effects are also discussed including risks of infertility, permanent damage to nerve function, hearing loss, chronic fatigue, kidney damage with possibility needing hemodialysis, and rare secondary malignancy including bone marrow disorders.  The patient is aware that the response rates discussed earlier is not guaranteed.  After a long discussion, patient made an informed decision to proceed with the prescribed plan of care.   Patient education material was dispensed Due to poor venous access, I recommend port placement We will discontinue olaparib I plan to see her in 2 weeks before the start date of treatment for symptom management and to address all her questions I recommend the patient to start monitoring her blood pressure twice a day

## 2021-01-29 NOTE — Assessment & Plan Note (Signed)
Her cancer associated pain is stable She will continue prescribed pain medicine for now

## 2021-01-29 NOTE — Progress Notes (Signed)
[place] OFFICE PROGRESS NOTE  Patient Care Team: Marrian Salvage, [place] as PCP - General (Internal Medicine) Judeth Horn, MD as Consulting Physician (General Surgery) Nat Math, MD as Referring Physician (Obstetrics and Gynecology) Elam Dutch, MD as Consulting Physician (Vascular Surgery) Heath Lark, MD as Consulting Physician (Hematology and Oncology)  ASSESSMENT & PLAN:  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 [facility]) I have reviewed recent blood work and CT imaging with the patient Unfortunately, she has cancer recurrence Her recent CEA level was normal I do not believe this represent recurrent metastatic gastric cancer This is most likely recurrent ovarian cancer She is symptomatic I reviewed the guidelines with her  We discussed the role of chemotherapy is of palliative intent, in accordance with NCCN guidelines, based on publication below: Int J Gynecol Cancer. 2005 May-Jun;15 Suppl 1:36-41. Combination therapy with gemcitabine and carboplatin in recurrent ovarian cancer. Pfisterer J1, [place] I, [place] A, Eisenhauer E; AGO-OVAR,; NCIC CTG; EORTC GCG.  Participants with recurrent platinum-sensitive ovarian cancer were randomly assigned to receive either gemcitabine-carboplatin or carboplatin every 21 days. The primary objective was to compare progression-free survival (PFS) between the groups. From September 1999 to April 2002, 356 patients (178 participants received gemcitabine-carboplatin, 178 received carboplatin only) were randomized to treatment. Patients received six cycles of either gemcitabine-carboplatin or carboplatin. With a median follow-up of 17 months, median PFS was 8.6 months for gemcitabine-carboplatin (95% confidence interval [CI] 7.9-9.7 months) and 5.8 months for carboplatin (95% CI 5.2-7.1 months; hazard ratio [HR] 0.72 [95% CI 0.58-0.90; P = 0.0032]). The response rate for the gemcitabine-carboplatin group was 47.2% (95% CI 39.9-54.5%) and  30.9% for carboplatin group (95% CI 24.1-37.7%; P = 0.0016). The HR for overall survival was 0.96 (95% CI 0.75-1.23; P = 0.7349). Patients treated with gemcitabine-carboplatin reported significantly faster palliation of abdominal symptoms and a significantly improved global quality of life. Gemcitabine-carboplatin treatment significantly improves the PFS of patients with platinum-sensitive recurrent ovarian cancer.  We discussed the role of chemotherapy. The intent is of palliative intent.  We discussed some of the risks, benefits, side-effects of carboplatin & gemcitabine Based on recent FDA approval, I will also add bevacizumab  Some of the short term side-effects included, though not limited to, including weight loss, life threatening infections, risk of allergic reactions, hypertension, risk of blood clots need for transfusions of blood products, nausea, vomiting, change in bowel habits, loss of hair, admission to hospital for various reasons, and risks of death.   Long term side-effects are also discussed including risks of infertility, permanent damage to nerve function, hearing loss, chronic fatigue, kidney damage with possibility needing hemodialysis, and rare secondary malignancy including bone marrow disorders.  The patient is aware that the response rates discussed earlier is not guaranteed.  After a long discussion, patient made an informed decision to proceed with the prescribed plan of care.   Patient education material was dispensed Due to poor venous access, I recommend port placement We will discontinue olaparib I plan to see her in 2 weeks before the start date of treatment for symptom management and to address all her questions I recommend the patient to start monitoring her blood pressure twice a day   Cancer associated pain Her cancer associated pain is stable She will continue prescribed pain medicine for now  Essential hypertension She had history of hypertension Her  blood pressure is okay She will continue close monitoring of blood pressure in anticipation for future with bevacizumab  Goals of care, counseling/discussion The patient  is tearful I explained to her why we need to resume treatment with chemotherapy and she is in agreement I am hopeful we can get her back to complete response with chemo plus bevacizumab    Orders Placed This Encounter  Procedures   IR IMAGING GUIDED PORT INSERTION    Need port for chemo to start on 3/21    Standing Status:   Future    Standing Expiration Date:   01/29/2022   CBC with Differential ([place] Only)    Standing Status:   Standing    Number of Occurrences:   20    Standing Expiration Date:   01/29/2022   CMP ([place] only)    Standing Status:   Standing    Number of Occurrences:   20    Standing Expiration Date:   01/29/2022   Total Protein, Urine dipstick    Standing Status:   Standing    Number of Occurrences:   11    Standing Expiration Date:   01/29/2022    All questions were answered. The patient knows to call the clinic with any problems, questions or concerns. The total time spent in the appointment was 40 minutes encounter with patients including review of chart and various tests results, discussions about plan of care and coordination of care plan   Heath Lark, MD 01/29/2021 12:56 PM  INTERVAL HISTORY: Please see below for problem oriented charting. She returns for treatment and follow-up Her pain is stable She denies recent constipation or nausea She is tearful today at the prospect of returning back to chemo  SUMMARY OF ONCOLOGIC HISTORY: Oncology History Overview Note  BRCA1 positive   Cancer of right colon ([place])  09/16/2014 Imaging   CT abd/pel:  a 3.8cm cecal mass with associated ileocecal intussusception, and a 9.1cm solid and cystic mass in the left pelvis,   09/18/2014 Surgery   right hemicolectomy and terminal ileoectomy   09/18/2014 Pathologic Stage   pT3pN1Mx,  tumor extends into pericolonic soft tissue and is less than 69m from the serosal surface, LVI 8-), PNI (-), all 23 node negative, one soft tissue tumor deposit, surgical margins negative.    09/18/2014 Initial Diagnosis   Adenocarcinoma of right colon   08/06/2016 Imaging   CT chest/abd/pelvis with contrast IMPRESSION: 1. No recurrent malignancy identified. 2. 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 On the prior exam from 09/13/2015 there was back flow of contrast in the left external iliac artery to supply the left internal iliac artery ; this back flow of contrast is no longer present in the left external iliac artery is occluded. 3. Several pulmonary nodules at or below 4 mm diameter, unchanged. 4. 3 mm hypodensity posteriorly in the body of the pancreas, not well seen previously and subtle today, likely incidental, but merits observation. 5. 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 Prominent degenerative spurring of both acetabula. 6. Lumbar spondylosis and degenerative disc disease causing prominent impingement at L4-5 and mild impingement at L5-S1.   03/08/2017 Imaging   CT CAP W Contrast 03/08/17 IMPRESSION: Chest Impression: 1. Stable small pulmonary nodules within LEFT lung. 2. No mediastinal lymphadenopathy.  Abdomen / Pelvis Impression: 1. No evidence of metastatic colorectal cancer or ovarian cancer within the abdomen pelvis. 2. No lymphadenopathy. 3. Postsurgical change consistent RIGHT hemicolectomy and hysterectomy 4. A fem-fem arterial bypass noted.   11/13/2017 Imaging   CT AP w contrast IMPRESSION: 1.  No findings of recurrent malignancy in the abdomen or pelvis. 2. Other imaging findings of potential clinical significance:Chronic AVN of both femoral heads without contour abnormality. Lumbar impingement at L4-5 and L5-S1. Aortic Atherosclerosis (ICD10-I70.0). 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 Partial right hemicolectomy. Hysterectomy.   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 ([place])  10/09/2014 Imaging   CT of the abdomen and pelvis showed a 9.1 cm solid and cystic mass in the left pelvis concerning for malignancy. This was discovered during her workup for colon cancer.   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 Tumor size 3.7 cm, no lymph nodes removed. T1cNx. Tumor cells are positive for cytokeratin 7 and estrogen receptor.   11/01/2014 Surgery   B/l Salpingo-oophorectomy.   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 Stable small pulmonary nodules within LEFT lung. 2. No mediastinal lymphadenopathy. Abdomen / Pelvis Impression: 1. No evidence of metastatic colorectal cancer or ovarian cancer within the abdomen pelvis. 2. No lymphadenopathy. 3. Postsurgical change consistent RIGHT hemicolectomy and hysterectomy 4. A fem-fem arterial bypass noted.   03/12/2017 Mammogram   MM Right Breast 03/12/17 IMPRESSION: No mammographic or sonographic evidence of malignancy. No correlate identified for the abnormal enhancement seen within the right nipple on MRI of 02/27/2017.    04/02/2017 Pathology Results   Surgical Pathology of Nipple Biopsy: 04/02/17 Diagnosis Nipple Biopsy, Right - BENIGN NIPPLE TISSUE. - NO ATYPIA OR MALIGNANCY.    11/13/2017 Imaging   1. No findings of recurrent malignancy in the abdomen or pelvis. 2. Other imaging findings of potential clinical significance: Chronic AVN of both femoral heads without contour abnormality.  Lumbar impingement at L4-5 and L5-S1. Aortic Atherosclerosis (ICD10-I70.0). 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 Partial right hemicolectomy. Hysterectomy.   03/22/2018 Mammogram   Screening mammogram IMPRESSION No evidence of malignancy.     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   06/06/2019 Imaging   1. Tiny bilateral pulmonary nodules stable since 11/12/2018. Continued close attention on follow-up recommended. 2. Incompletely visualized upper abdominal lymphadenopathy better characterized on the abdomen CT from 2 weeks ago. 3.  Aortic Atherosclerois (ICD10-170.0) 4.  Emphysema. (GOT15-B26.9)   06/07/2019 Procedure   Technically successful CT guided core needle biopsy of indeterminate peritoneal nodule within the right lower abdomen/pelvis.   06/07/2019 Pathology Results   Soft Tissue Needle Core Biopsy, peritoneal nodule within right lower abdomen - METASTATIC CARCINOMA CONSISTENT WITH PATIENT'S CLINICAL HISTORY OF PRIMARY OVARIAN CARCINOMA. SEE NOTE Diagnosis Note Immunohistochemical stains show that the tumor cells are positive for CK7, ER and PAX 8; and negative for CK20 and CDX2. This immunostaining profile is consistent with above diagnosis.   06/16/2019 Tumor Marker   Patient's tumor was tested for the following markers: CA-125 Results of the tumor marker test revealed 499   06/20/2019 - 11/14/2019  Chemotherapy   The patient had carboplatin and taxol   07/11/2019 Tumor Marker   Patient's tumor was tested for the following markers: CA-125 Results of the tumor marker test revealed 438   08/05/2019 Tumor Marker   Patient's tumor was tested for the following markers: CA-125 Results  of the tumor marker test revealed 272.   08/25/2019 Imaging   Ct chest, abdomen and pelvis 1. Slight interval decrease in size of a right lower quadrant peritoneal nodule measuring 1.7 x 1.6 cm, previously 2.2 x 2.2 cm (series 2, image 94, series 5, image 58). Findings are consistent with treatment response.   2. No significant change in ill-defined soft tissue about the sigmoid colon in the hysterectomy bed (series 2, image 101).   3.  Status post hysterectomy.   4. No evidence of metastatic disease in the chest. Stable benign small pulmonary nodules.   5.  Coronary artery disease.  Aortic Atherosclerosis (ICD10-I70.0).     08/26/2019 Tumor Marker   Patient's tumor was tested for the following markers: CA-125 Results of the tumor marker test revealed 189.   09/26/2019 Tumor Marker   Patient's tumor was tested for the following markers: CA-125 Results of the tumor marker test revealed 137   10/03/2019 Tumor Marker   Patient's tumor was tested for the following markers: CA-125 Results of the tumor marker test revealed 137.   10/31/2019 Tumor Marker   Patient's tumor was tested for the following markers: CA-125 Results of the tumor marker test revealed 101   11/29/2019 Imaging   1. Right lower quadrant peritoneal implant is decreased. Stable irregular nodular pelvic peritoneal thickening superior to the vaginal cuff. 2. No new or progressive metastatic disease. No abdominopelvic lymphadenopathy. 3.  Aortic Atherosclerosis (ICD10-I70.0).   12/15/2019 -  Chemotherapy   The patient had Lynparza for chemotherapy treatment.     12/23/2019 Tumor Marker   Patient's tumor was tested for the following markers: CA-125 Results of the tumor marker test revealed 55.9   01/02/2020 Tumor Marker   Patient's tumor was tested for the following markers: CA-125. Results of the tumor marker test revealed 58.6   02/03/2020 Tumor Marker   Patient's tumor was tested for the following markers:  CA-125 Results of the tumor marker test revealed 52.2   02/24/2020 Tumor Marker   Patient's tumor was tested for the following markers: CA-125 Results of the tumor marker test revealed 64.8   04/19/2020 Imaging   1. Continued slight interval decrease in size of a soft tissue nodule in the right paracolic gutter, consistent with treatment response. 2. Soft tissue in the hysterectomy bed near the distal sigmoid colon and rectum may be slightly decreased in size, difficult to measure due to configuration but apparently diminished in size, also consistent with treatment response. 3. Status post hysterectomy and oophorectomy as well as right colon resection. 4. No evidence of new metastatic disease in the abdomen or pelvis. 5. Redemonstrated long segment occlusion of the left common, internal, and external iliac arteries. Status post femoral-femoral bypass grafting with reconstitution of the left common femoral artery. Aortic Atherosclerosis (ICD10-I70.0).   08/15/2020 Tumor Marker   Patient's tumor was tested for the following markers: CA-125 Results of the tumor marker test revealed 47.1   10/22/2020 Imaging   1. Reduced size of the nodule along the right paracolic gutter, currently 0.6 by 1.2 cm, previously 0.7 by 1.4 cm on 04/19/2020 and previously 0.9 by 1.5 cm on 11/29/2019. Appearance compatible with treatment response. 2. Further mild  reduction in size of the bandlike density along the rectosigmoid mesentery, probably from scarring or effectively treated tumor. 3. Prominent stool throughout the colon favors constipation.  4. Stable chronic occlusion of the left iliac arteries, with a patent fem-fem bypass. 5. 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 6. Lower lumbar spondylosis and degenerative disc disease causing impingement at L4-5 and L5-S1. 7. Aortic atherosclerosis.     10/23/2020 Tumor Marker   Patient's  tumor was tested for the following markers: CA-125 Results of the tumor marker test revealed 74.6   01/21/2021 Tumor Marker   Patient's tumor was tested for the following markers: CA-125 Results of the tumor marker test revealed 825   01/28/2021 Imaging   1. Changes of right hemicolectomy with slightly decreased size of the nodule along the right pericolic gutter and similar band like density along the rectosigmoid mesentery. 2. Two new well-circumscribed low-density nodules in the presacral perirectal space, difficult to ascertain whether they arise from the bowel wall or arise from within the peritoneum. This is a nonspecific finding which could be further evaluated with colonoscopy, repeat CT with rectal contrast or attention on close interval follow-up imaging in 3 months. 3. 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 4. 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 5. Emphysema and aortic atherosclerosis. Aortic Atherosclerosis (ICD10-I70.0) and Emphysema (ICD10-J43.9).   02/11/2021 -  Chemotherapy    Patient is on Treatment Plan: OVARIAN RECURRENT 3RD LINE CARBOPLATIN D1 / GEMCITABINE D1,8 (4/800) Q21D WITH BEVACIZUMAB        REVIEW OF SYSTEMS:   Constitutional: Denies fevers, chills or abnormal weight loss Eyes: Denies blurriness of vision Ears, nose, mouth, throat, and face: Denies mucositis or sore throat Respiratory: Denies cough, dyspnea or wheezes Cardiovascular: Denies palpitation, chest discomfort or lower extremity swelling Gastrointestinal:  Denies nausea, heartburn or change in bowel habits Skin: Denies abnormal skin rashes Lymphatics: Denies new lymphadenopathy or easy bruising Neurological:Denies numbness, tingling or new weaknesses Behavioral/Psych: Mood is stable, no new changes  All other systems were reviewed with the patient and are negative.  I have reviewed the past medical history, past surgical history,  social history and family history with the patient and they are unchanged from previous note.  ALLERGIES:  is allergic to other, demerol [meperidine], morphine and related, oxycodone, penicillins, strawberry flavor, tylenol [acetaminophen], and ibuprofen.  MEDICATIONS:  Current Outpatient Medications  Medication Sig Dispense Refill   acetaminophen (TYLENOL) 325 MG tablet Take 325 mg by mouth as needed.     docusate sodium 100 MG CAPS Take 100 mg by mouth 2 (two) times daily as needed for mild constipation. 10 capsule 0   HYDROcodone-acetaminophen (NORCO/VICODIN) 5-325 MG tablet Take 1 tablet by mouth every 6 (six) hours as needed for moderate pain. 90 tablet 0   lidocaine (LIDODERM) 5 % PLACE 1 PATCH ONTO THE SKIN EVERY DAY.REMOVE AND DISCARD PATCH WITHIN 12 HOURS OR AS DIRECTED BY PRESCRIBER 30 patch 1   lidocaine-prilocaine (EMLA) cream Apply to affected area once 30 g 3   LORazepam (ATIVAN) 0.5 MG tablet Take 1 tablet (0.5 mg total) by mouth 3 (three) times daily as needed for anxiety. (Patient not taking: Reported on 12/21/2020) 10 tablet 0   losartan (COZAAR) 50 MG tablet Take 1 tablet (50 mg total) by mouth daily. 90 tablet 3   ondansetron (ZOFRAN) 8 MG tablet TAKE 1 TABLET(8 MG) BY  MOUTH EVERY 8 HOURS AS NEEDED FOR NAUSEA OR VOMITING 90 tablet 3   ondansetron (ZOFRAN) 8 MG tablet Take 1 tablet (8 mg total) by mouth every 8 (eight) hours as needed. Start on the third day after chemotherapy. 30 tablet 1   pregabalin (LYRICA) 200 MG capsule Take 1 capsule (200 mg total) by mouth every 8 (eight) hours. Please give patient brand name Lyrica 270 capsule 4   prochlorperazine (COMPAZINE) 10 MG tablet TAKE 1 TABLET(10 MG) BY MOUTH EVERY 6 HOURS AS NEEDED FOR NAUSEA OR VOMITING 90 tablet 1   prochlorperazine (COMPAZINE) 10 MG tablet Take 1 tablet (10 mg total) by mouth every 6 (six) hours as needed (Nausea or vomiting). 30 tablet 1   No current facility-administered medications for this  visit.    PHYSICAL EXAMINATION: ECOG PERFORMANCE STATUS: 1 - Symptomatic but completely ambulatory  Vitals:   01/29/21 1003  BP: (!) 114/92  Pulse: 66  Resp: 18  Temp: 97.6 F (36.4 C)  SpO2: 100%   Filed Weights   01/29/21 1003  Weight: 140 lb 12.8 oz (63.9 kg)    GENERAL:alert, no distress and comfortable NEURO: alert & oriented x 3 with fluent speech, no focal motor/sensory deficits  LABORATORY DATA:  I have reviewed the data as listed    Component Value Date/Time   NA 139 01/21/2021 1145   NA 141 10/07/2017 1436   K 3.6 01/21/2021 1145   K 4.4 10/07/2017 1436   CL 105 01/21/2021 1145   CO2 26 01/21/2021 1145   CO2 26 10/07/2017 1436   GLUCOSE 99 01/21/2021 1145   GLUCOSE 85 10/07/2017 1436   BUN 12 01/21/2021 1145   BUN 14.9 10/07/2017 1436   CREATININE 1.07 (H) 01/21/2021 1145   CREATININE 1.0 10/07/2017 1436   CALCIUM 9.4 01/21/2021 1145   CALCIUM 9.5 10/07/2017 1436   PROT 7.4 01/21/2021 1145   PROT 7.8 10/07/2017 1436   ALBUMIN 4.2 01/21/2021 1145   ALBUMIN 3.9 10/07/2017 1436   AST 15 01/21/2021 1145   AST 15 10/07/2017 1436   ALT 6 01/21/2021 1145   ALT 7 10/07/2017 1436   ALKPHOS 70 01/21/2021 1145   ALKPHOS 81 10/07/2017 1436   BILITOT 0.4 01/21/2021 1145   BILITOT 0.54 10/07/2017 1436   GFRNONAA 60 (L) 01/21/2021 1145   GFRAA >60 08/15/2020 1121    No results found for: SPEP, UPEP  Lab Results  Component Value Date   WBC 5.9 01/21/2021   NEUTROABS 2.3 01/21/2021   HGB 12.1 01/21/2021   HCT 33.0 (L) 01/21/2021   MCV 110.7 (H) 01/21/2021   PLT 140 (L) 01/21/2021      Chemistry      Component Value Date/Time   NA 139 01/21/2021 1145   NA 141 10/07/2017 1436   K 3.6 01/21/2021 1145   K 4.4 10/07/2017 1436   CL 105 01/21/2021 1145   CO2 26 01/21/2021 1145   CO2 26 10/07/2017 1436   BUN 12 01/21/2021 1145   BUN 14.9 10/07/2017 1436   CREATININE 1.07 (H) 01/21/2021 1145   CREATININE 1.0 10/07/2017 1436      Component Value  Date/Time   CALCIUM 9.4 01/21/2021 1145   CALCIUM 9.5 10/07/2017 1436   ALKPHOS 70 01/21/2021 1145   ALKPHOS 81 10/07/2017 1436   AST 15 01/21/2021 1145   AST 15 10/07/2017 1436   ALT 6 01/21/2021 1145   ALT 7 10/07/2017 1436   BILITOT 0.4 01/21/2021 1145   BILITOT 0.54  10/07/2017 1436       RADIOGRAPHIC STUDIES: I have reviewed recent CT imaging with the patient I have personally reviewed the radiological images as listed and agreed with the findings in the report. CT CHEST ABDOMEN PELVIS W CONTRAST  Result Date: 01/28/2021 CLINICAL DATA:  Restaging of colon cancer and ovarian cancer. Right hemicolectomy 09/18/2014, hysterectomy and oophorectomy 12/26/2014. EXAM: CT CHEST, ABDOMEN, AND PELVIS WITH CONTRAST TECHNIQUE: Multidetector CT imaging of the chest, abdomen and pelvis was performed following the standard protocol during bolus administration of intravenous contrast. CONTRAST:  165m OMNIPAQUE IOHEXOL 300 MG/ML  SOLN COMPARISON:  CT abdomen and pelvis October 22, 2021 and CT chest August 25, 2019 FINDINGS: CT CHEST FINDINGS Cardiovascular: No thoracic aortic aneurysm. The no central pulmonary embolus. Normal size heart. No pericardial effusion. Coronary artery calcifications. Mediastinum/Nodes: No enlarged mediastinal, hilar, or axillary lymph nodes. Thyroid gland, trachea, and esophagus demonstrate no significant findings. Lungs/Pleura: Mild emphysematous change. Stable 4 mm pulmonary nodule in the right upper lobe image 60/4 and 3 mm nodule in the left lower lobe image 87/4. No suspicious pulmonary nodules or masses. No pneumothorax. No pleural effusion. Musculoskeletal: No chest wall mass or suspicious bone lesions identified. CT ABDOMEN PELVIS FINDINGS Hepatobiliary: No suspicious hepatic lesion. Gallbladder is unremarkable. No biliary ductal dilation. Pancreas: Unremarkable. Spleen: Unremarkable. Adrenals/Urinary Tract: Bilateral adrenal glands are unremarkable. No hydronephrosis.  Urinary bladder is unremarkable for degree of distension. Stomach/Bowel: Similar changes of right hemicolectomy. Similar band like density along the rectosigmoid mesentery measuring 1 cm in thickness, on image 104/2 unchanged and suggestive of scarring versus treated tumor. Adjacent to this area thickening there are 2 low-density well-circumscribed areas 1 of which measures 1.1 cm on image 102/2 the other of which measures 1.8 cm on image 104/2, it is difficult to ascertain whether these arise directly from the bowel wall or are within the peritoneum. No evidence of bowel obstruction. Vascular/Lymphatic: Stable chronic occlusion of the left iliac arteries with patent fem-fem bypass noted. Aortic atherosclerosis. Reproductive: Status post hysterectomy. No adnexal masses. Other: Slightly decreased size of the nodule along the right pericolic gutter which now measures 0.5 x 1.2 cm previously 0.6 x 1.2 cm on Apr 19, 2020 and previously 0.7 x 1.4 cm on 04/19/2020 and previously 0.9 x 1.5 cm on 11/28/2018 Musculoskeletal: Chronic avascular necrosis of the left greater than right hips. Multilevel degenerative change of the spine no acute osseous abnormality. IMPRESSION: 1. Changes of right hemicolectomy with slightly decreased size of the nodule along the right pericolic gutter and similar band like density along the rectosigmoid mesentery. 2. Two new well-circumscribed low-density nodules in the presacral perirectal space, difficult to ascertain whether they arise from the bowel wall or arise from within the peritoneum. This is a nonspecific finding which could be further evaluated with colonoscopy, repeat CT with rectal contrast or attention on close interval follow-up imaging in 3 months. 3. 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 4. 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 5. Emphysema and aortic atherosclerosis. Aortic Atherosclerosis (ICD10-I70.0) and  Emphysema (ICD10-J43.9). Electronically Signed   By: JDahlia BailiffMD   On: 01/28/2021 15:54

## 2021-02-01 ENCOUNTER — Telehealth: Payer: Self-pay

## 2021-02-01 NOTE — Telephone Encounter (Signed)
Called and given appt details for port placement on 3/18, arrive at 1 pm to [facility]. She has to have a driver and nothing to eat/drink after 8 am that morning. She verbalzied understanding.

## 2021-02-04 NOTE — Progress Notes (Signed)
Pharmacist Chemotherapy Monitoring - Initial Assessment    Anticipated start date: 02/11/21  Regimen:  . Are orders appropriate based on the patient's diagnosis, regimen, and cycle? Yes . Does the plan date match the patient's scheduled date? Yes . Is the sequencing of drugs appropriate? Yes . Are the premedications appropriate for the patient's regimen? Yes . Prior Authorization for treatment is: Pending o If applicable, is the correct biosimilar selected based on the patient's insurance? yes  Organ Function and Labs: [name] Are dose adjustments needed based on the patient's renal function, hepatic function, or hematologic function? Yes . Are appropriate labs ordered prior to the start of patient's treatment? Yes . Other organ system assessment, if indicated: N/A . The following baseline labs, if indicated, have been ordered: bevacizumab: urine protein  Dose Assessment: . Are the drug doses appropriate? Yes . Are the following correct: o Drug concentrations Yes o IV fluid compatible with drug Yes o Administration routes Yes o Timing of therapy Yes . If applicable, does the patient have documented access for treatment and/or plans for port-a-cath placement? yes . If applicable, have lifetime cumulative doses been properly documented and assessed? yes Lifetime Dose Tracking  . Carboplatin: 3,080 mg = 0.01 % of the maximum lifetime dose of 999,999,999 mg  o   Toxicity Monitoring/Prevention: . The patient has the following take home antiemetics prescribed: Prochlorperazine . The patient has the following take home medications prescribed: N/A . Medication allergies and previous infusion related reactions, if applicable, have been reviewed and addressed. Yes . The patient's current medication list has been assessed for drug-drug interactions with their chemotherapy regimen. no significant drug-drug interactions were identified on review.  Order Review: . Are the treatment plan orders  signed? Yes . Is the patient scheduled to see a provider prior to their treatment? No  I verify that I have reviewed each item in the above checklist and answered each question accordingly.  Philomena Course, [place], 02/04/2021  9:55 AM

## 2021-02-05 ENCOUNTER — Encounter: Payer: Self-pay | Admitting: Physical Medicine and Rehabilitation

## 2021-02-05 ENCOUNTER — Encounter: Payer: 59 | Attending: Physical Medicine and Rehabilitation | Admitting: Physical Medicine and Rehabilitation

## 2021-02-05 ENCOUNTER — Other Ambulatory Visit: Payer: Self-pay

## 2021-02-05 VITALS — BP 167/90 | HR 75 | Temp 98.1°F | Ht 67.0 in | Wt 139.4 lb

## 2021-02-05 DIAGNOSIS — C562 Malignant neoplasm of left ovary: Secondary | ICD-10-CM | POA: Insufficient documentation

## 2021-02-05 DIAGNOSIS — R110 Nausea: Secondary | ICD-10-CM

## 2021-02-05 DIAGNOSIS — G620 Drug-induced polyneuropathy: Secondary | ICD-10-CM

## 2021-02-05 DIAGNOSIS — Z7409 Other reduced mobility: Secondary | ICD-10-CM | POA: Diagnosis present

## 2021-02-05 DIAGNOSIS — T451X5A Adverse effect of antineoplastic and immunosuppressive drugs, initial encounter: Secondary | ICD-10-CM | POA: Insufficient documentation

## 2021-02-05 NOTE — Patient Instructions (Signed)
-  Discussed following foods that may reduce pain: 1) Ginger 2) Blueberries 3) Salmon 4) Pumpkin seeds 5) dark chocolate 6) turmeric 7) tart cherries 8) virgin olive oil 9) chilli peppers 10) mint 11) red wine 12) garlic  Link to further information on diet for chronic pain: [email or web address]  

## 2021-02-05 NOTE — Progress Notes (Signed)
Subjective:    Patient ID: Catherine Rogers, female    DOB: 06-May-1961, 60 y.o.   MRN: [id number]  HPI  Catherine Rogers is a 60 year old woman who presents to establish care of ovarian cancer related pain:  1) Coccyx and groin pain secondary to ovarian cancer -notes reviewed, she had a recurrence of ovarian cancer and has plans for chemo next week -she is understandably overwhelmed, but does have a strong support system among family, friends, and church -she often feels she does not want to be a burden to them and tries to put on a positive front despite her pain -she finds marijuana helps her pain and helps her to sleep more than the Norco, which she takes sparingly.  2) Peripheral neuropathy: -left lower extremity -she takes Lyrica which helps -does not need a refill -would not like to increase dose.   3) Chemotherapy-induced nausea -ondansetron helps and she has some at home  4) Impaired balance -secondary to her neuropathy -she would like to do home therapy   Pain Inventory Average Pain 7 Pain Right Now 7 My pain is sharp, stabbing, tingling and aching  In the last 24 hours, has pain interfered with the following? General activity 7 Relation with others 9 Enjoyment of life 8 What TIME of day is your pain at its worst? evening Sleep (in general) NA  Pain is worse with: walking, bending, sitting, inactivity, standing and some activites Pain improves with: medication Relief from Meds: 6  walk without assistance how many minutes can you walk? 1-2 ability to climb steps?  yes do you drive?  yes  retired I need assistance with the following:  household duties  numbness tremor tingling trouble walking spasms  Any changes since last visit?  no   Undergoing chemo, reports cancer has spread   Primary care Jodi Mourning FNP Murrell Converse MD    Family History  Problem Relation Age of Onset  . Brain cancer Sister 31  . Lung cancer Brother 67  . Heart attack  Mother   . Heart attack Father        32s  . Heart disease Father   . Heart attack Maternal Uncle   . Diabetes Maternal Uncle   . Breast cancer Maternal Uncle   . Ovarian cancer Paternal Aunt        3 paternal aunts with ovarian cancer  . Heart attack Brother   . Diabetes Paternal Uncle    Social History   Socioeconomic History  . Marital status: Married    Spouse name: Juanda Crumble  . Number of children: 6  . Years of education: 15  . Highest education level: High school graduate  Occupational History  . Occupation: Disability  Tobacco Use  . Smoking status: Current Some Day Smoker    Years: 10.00    Types: Cigars  . Smokeless tobacco: Never Used  . Tobacco comment: pack per month of cigars = 5  Vaping Use  . Vaping Use: Never used  Substance and Sexual Activity  . Alcohol use: No  . Drug use: Not Currently    Frequency: 5.0 times per week    Types: Marijuana    Comment: last smoked 09/22/17  . Sexual activity: Never  Other Topics Concern  . Not on file  Social History Narrative   Born and raised in [place], [place]. Currently resides in a house by herself. No pets. Fun: Read, shoot pool, poetry, Journal, knitting   Denies religious beliefs that  would effect health care.    Lives at home with her son.   Right-handed.   No caffeine use.   Denies abuse and feels safe at home.    Social Determinants of Health   Financial Resource Strain: Not on file  Food Insecurity: Not on file  Transportation Needs: Not on file  Physical Activity: Not on file  Stress: Not on file  Social Connections: Not on file   Past Surgical History:  Procedure Laterality Date  . ABDOMINAL HYSTERECTOMY    . BREAST BIOPSY Right 04/02/2017   Procedure: INCISIONAL BIOPSY RIGHT NIPPLE;  Surgeon: Coralie Keens, MD;  Location: [facility];  Service: General;  Laterality: Right;  . COLONOSCOPY    . ESOPHAGOGASTRODUODENOSCOPY N/A 09/17/2014   Procedure: ESOPHAGOGASTRODUODENOSCOPY  (EGD);  Surgeon: Jeryl Columbia, MD;  Location: [facility] ENDOSCOPY;  Service: Endoscopy;  Laterality: N/A;  . FEMORAL-FEMORAL BYPASS GRAFT Bilateral 04/18/2015   Procedure:  RIGHT COMMON FEMORAL TO LEFT COMMON  FEMORAL  ARTERY BYPASS WITH  8 MM HEMASHIELD GRAFT,RIGHT COMMON SUPERFICIAL FEMORAL ARTERY ENDARTERECTOMY;  Surgeon: Elam Dutch, MD;  Location: [facility];  Service: Vascular;  Laterality: Bilateral;  . ILEOSTOMY Right 09/18/2014   Procedure: ILEOCOLOSTOMY;  Surgeon: Doreen Salvage, MD;  Location: [facility];  Service: General;  Laterality: Right;  . LAPAROTOMY N/A 11/01/2014   Procedure: EXPLORATORY LAPAROTOMY;  Surgeon: Doreen Salvage, MD;  Location: [place];  Service: General;  Laterality: N/A;  . LAPAROTOMY N/A 12/26/2014   Procedure:  LAPAROTOMY ;  Surgeon: Everitt Amber, MD;  Location: [facility];  Service: Gynecology;  Laterality: N/A;  . MASS EXCISION  11/01/2014   Procedure: EXCISION PELVIC MASS;  Surgeon: Doreen Salvage, MD;  Location: [place];  Service: General;;  . OOPHORECTOMY  2016  . PARTIAL COLECTOMY Right 09/18/2014   Procedure: RIGHT HEMI COLECTOMY;  Surgeon: Doreen Salvage, MD;  Location: [facility];  Service: General;  Laterality: Right;  . ROBOTIC ASSISTED TOTAL HYSTERECTOMY WITH BILATERAL SALPINGO OOPHERECTOMY Right 12/26/2014   Procedure: EXPLORATORY LAPAROSCOPY  RIGHT SALPINGO OOPHORECTOMY OMENTECTOMY LYMPHADECTOMY ;  Surgeon: Everitt Amber, MD;  Location: [facility];  Service: Gynecology;  Laterality: Right;  . TUBAL LIGATION     Past Medical History:  Diagnosis Date  . Anemia   . Anxiety   . Arthritis   . Asthma   . Cancer ([place]) dx'd 08/2014   colon and ovarian  . Complication of anesthesia    woke and saw long tube coming out of mouth . sat up then went back to sleep  . Depression   . Ectopic pregnancy   . Family history of adverse reaction to anesthesia    sister on dialysis- hypotension   . Family history of ovarian cancer   . Head injury, closed, with brief LOC ([place])    had a cut- as a child  . Headache    . Hepatitis    being treated for Hep C  . History of blood transfusion    2005 approximately   . Hypertension    no meds now  . PAD (peripheral artery disease) ([place]) 04/18/2015  . Pelvic cyst   . PONV (postoperative nausea and vomiting)   . PTSD (post-traumatic stress disorder)    BP (!) 167/90   Pulse 75   Temp 98.1 F (36.7 C)   Ht 5\' 7"  (1.702 m)   Wt 139 lb 6.4 oz (63.2 kg)   SpO2 98%   BMI 21.83 kg/m   Opioid Risk Score:  Fall Risk Score:  `1  Depression screen PHQ 2/9  Depression screen [facility] 2/9 02/05/2021 07/13/2020 09/08/2019 08/19/2019 12/08/2017  Decreased Interest 0 0 1 1 2   Down, Depressed, Hopeless 0 0 0 1 3  PHQ - 2 Score 0 0 1 2 5   Altered sleeping 0 - - 0 1  Tired, decreased energy 0 - - 1 1  Change in appetite 0 - - 1 0  Feeling bad or failure about yourself  0 - - 0 1  Trouble concentrating 0 - - 0 1  Moving slowly or fidgety/restless 0 - - 0 0  Suicidal thoughts 0 - - 0 0  PHQ-9 Score 0 - - 4 9  Difficult doing work/chores - - - Somewhat difficult Somewhat difficult  Some recent data might be hidden   Review of Systems  Constitutional: Negative.   HENT: Negative.   Eyes: Negative.   Respiratory: Negative.   Cardiovascular: Negative.   Gastrointestinal: Negative.   Endocrine: Negative.   Genitourinary: Negative.   Musculoskeletal: Positive for gait problem.       Spasms  Allergic/Immunologic: Negative.   Neurological: Positive for tremors and numbness.       Tingling  Hematological: Negative.   Psychiatric/Behavioral: Negative.   All other systems reviewed and are negative.      Objective:   Physical Exam        Assessment & Plan:  Catherine Rogers is a 60 year old woman who presents to establish care of ovarian cancer related pain:  1) Coccyx and groin pain secondary to ovarian cancer -notes reviewed -discussed getting XR to assess for metastasis and discussed that treatment may be radiation. She does not want radiation at this time  and prefers not to get the XR -marijuana provides her relief. Discussed this clinic cannot prescribe Norco when using marijuana. It also helps with her insomnia. Discussed that we let her know if/when medical marijuana is legalized for cancer pain -she prefers less medicine and more natural remedies -Discussed following foods that may reduce pain: 1) Ginger 2) Blueberries 3) Salmon 4) Pumpkin seeds 5) dark chocolate 6) turmeric 7) tart cherries 8) virgin olive oil 9) chilli peppers 10) mint 11) red wine 12) garlic  Link to further information on diet for chronic pain: [email or web address]   2) Peripheral neuropathy: -continue lyrica, she does not want to increase dose at this time  3) Chemotherapy-induced nausea -continue ondansetron   4) Impaired balance -prescribed home PT  5) Goals of care: -discussed palliative care and provided referral for symptom management/hospice discussion as she had questions about this -encouraged doing what she loves, right now spending time with her 5 year old granddaughter most of all  60 minutes spent in discussing coccyx and groin pain, reviewing records, discussing etiologies, discussing ketogenic diet, providing list of foods that help to alleviate pain, discussion of lyrica/Norco/marijuana risks and benefits, prescribing home PT, referring to palliative care for symptom management/goals of care discussion, discussing her joys, the importance of sharing how she is feeling with her loved ones

## 2021-02-07 ENCOUNTER — Other Ambulatory Visit: Payer: Self-pay | Admitting: Student

## 2021-02-08 ENCOUNTER — Ambulatory Visit (HOSPITAL_COMMUNITY)
Admission: RE | Admit: 2021-02-08 | Discharge: 2021-02-08 | Disposition: A | Discharge status: Home / Self Care | Payer: 59 | Source: Ambulatory Visit | Attending: Hematology and Oncology | Admitting: Hematology and Oncology

## 2021-02-08 ENCOUNTER — Other Ambulatory Visit: Payer: Self-pay

## 2021-02-08 ENCOUNTER — Encounter (HOSPITAL_COMMUNITY): Payer: Self-pay

## 2021-02-08 DIAGNOSIS — Z8041 Family history of malignant neoplasm of ovary: Secondary | ICD-10-CM | POA: Diagnosis not present

## 2021-02-08 DIAGNOSIS — Z90722 Acquired absence of ovaries, bilateral: Secondary | ICD-10-CM | POA: Diagnosis not present

## 2021-02-08 DIAGNOSIS — Z85038 Personal history of other malignant neoplasm of large intestine: Secondary | ICD-10-CM | POA: Diagnosis not present

## 2021-02-08 DIAGNOSIS — Z569 Unspecified problems related to employment: Secondary | ICD-10-CM | POA: Insufficient documentation

## 2021-02-08 DIAGNOSIS — Z9049 Acquired absence of other specified parts of digestive tract: Secondary | ICD-10-CM | POA: Diagnosis not present

## 2021-02-08 DIAGNOSIS — Z803 Family history of malignant neoplasm of breast: Secondary | ICD-10-CM | POA: Insufficient documentation

## 2021-02-08 DIAGNOSIS — Z8679 Personal history of other diseases of the circulatory system: Secondary | ICD-10-CM | POA: Diagnosis not present

## 2021-02-08 DIAGNOSIS — I739 Peripheral vascular disease, unspecified: Secondary | ICD-10-CM | POA: Insufficient documentation

## 2021-02-08 DIAGNOSIS — Z885 Allergy status to narcotic agent status: Secondary | ICD-10-CM | POA: Diagnosis not present

## 2021-02-08 DIAGNOSIS — Z736 Limitation of activities due to disability: Secondary | ICD-10-CM | POA: Insufficient documentation

## 2021-02-08 DIAGNOSIS — Z886 Allergy status to analgesic agent status: Secondary | ICD-10-CM | POA: Insufficient documentation

## 2021-02-08 DIAGNOSIS — Z79899 Other long term (current) drug therapy: Secondary | ICD-10-CM | POA: Diagnosis not present

## 2021-02-08 DIAGNOSIS — Z808 Family history of malignant neoplasm of other organs or systems: Secondary | ICD-10-CM | POA: Diagnosis not present

## 2021-02-08 DIAGNOSIS — Z880 Allergy status to penicillin: Secondary | ICD-10-CM | POA: Insufficient documentation

## 2021-02-08 DIAGNOSIS — F17290 Nicotine dependence, other tobacco product, uncomplicated: Secondary | ICD-10-CM | POA: Diagnosis not present

## 2021-02-08 DIAGNOSIS — B182 Chronic viral hepatitis C: Secondary | ICD-10-CM | POA: Diagnosis not present

## 2021-02-08 DIAGNOSIS — C562 Malignant neoplasm of left ovary: Secondary | ICD-10-CM | POA: Diagnosis not present

## 2021-02-08 DIAGNOSIS — Z801 Family history of malignant neoplasm of trachea, bronchus and lung: Secondary | ICD-10-CM | POA: Diagnosis not present

## 2021-02-08 DIAGNOSIS — Z90710 Acquired absence of both cervix and uterus: Secondary | ICD-10-CM | POA: Diagnosis not present

## 2021-02-08 HISTORY — PX: IR IMAGING GUIDED PORT INSERTION: IMG5740

## 2021-02-08 LAB — CBC WITH DIFFERENTIAL/PLATELET
Abs Immature Granulocytes: 0 10*3/uL (ref 0.00–0.07)
Basophils Absolute: 0 10*3/uL (ref 0.0–0.1)
Basophils Relative: 1 %
Eosinophils Absolute: 0 10*3/uL (ref 0.0–0.5)
Eosinophils Relative: 1 %
HCT: 36.5 % (ref 36.0–46.0)
Hemoglobin: 13.1 g/dL (ref 12.0–15.0)
Immature Granulocytes: 0 %
Lymphocytes Relative: 53 %
Lymphs Abs: 2 10*3/uL (ref 0.7–4.0)
MCH: 40.3 pg — ABNORMAL HIGH (ref 26.0–34.0)
MCHC: 35.9 g/dL (ref 30.0–36.0)
MCV: 112.3 fL — ABNORMAL HIGH (ref 80.0–100.0)
Monocytes Absolute: 0.4 10*3/uL (ref 0.1–1.0)
Monocytes Relative: 9 %
Neutro Abs: 1.4 10*3/uL — ABNORMAL LOW (ref 1.7–7.7)
Neutrophils Relative %: 36 %
Platelets: 132 10*3/uL — ABNORMAL LOW (ref 150–400)
RBC: 3.25 MIL/uL — ABNORMAL LOW (ref 3.87–5.11)
RDW: 14.3 % (ref 11.5–15.5)
WBC: 3.7 10*3/uL — ABNORMAL LOW (ref 4.0–10.5)
nRBC: 0 % (ref 0.0–0.2)

## 2021-02-08 LAB — COMPREHENSIVE METABOLIC PANEL
ALT: 10 U/L (ref 0–44)
AST: 16 U/L (ref 15–41)
Albumin: 4.5 g/dL (ref 3.5–5.0)
Alkaline Phosphatase: 52 U/L (ref 38–126)
Anion gap: 11 (ref 5–15)
BUN: 11 mg/dL (ref 6–20)
CO2: 24 mmol/L (ref 22–32)
Calcium: 9.2 mg/dL (ref 8.9–10.3)
Chloride: 108 mmol/L (ref 98–111)
Creatinine, Ser: 0.76 mg/dL (ref 0.44–1.00)
GFR, Estimated: >60 mL/min (ref >60)
Glucose, Bld: 97 mg/dL (ref 70–99)
Potassium: 3.6 mmol/L (ref 3.5–5.1)
Sodium: 143 mmol/L (ref 135–145)
Total Bilirubin: 0.8 mg/dL (ref 0.3–1.2)
Total Protein: 7.5 g/dL (ref 6.5–8.1)

## 2021-02-08 MED ORDER — MIDAZOLAM HCL 2 MG/2ML IJ SOLN
INTRAMUSCULAR | Status: AC
  Filled 2021-02-08: qty 4

## 2021-02-08 MED ORDER — LIDOCAINE-EPINEPHRINE 1 %-1:100000 IJ SOLN
INTRAMUSCULAR | Status: AC | PRN
  Administered 2021-02-08: 10 mL

## 2021-02-08 MED ORDER — SODIUM CHLORIDE 0.9 % IV SOLN: INTRAVENOUS | Status: DC

## 2021-02-08 MED ORDER — LIDOCAINE-EPINEPHRINE 1 %-1:100000 IJ SOLN
INTRAMUSCULAR | Status: AC
  Filled 2021-02-08: qty 1

## 2021-02-08 MED ORDER — FENTANYL CITRATE (PF) 100 MCG/2ML IJ SOLN
INTRAMUSCULAR | Status: AC
  Filled 2021-02-08: qty 2

## 2021-02-08 MED ORDER — MIDAZOLAM HCL 2 MG/2ML IJ SOLN
INTRAMUSCULAR | Status: AC | PRN
  Administered 2021-02-08 (×3): 1 mg via INTRAVENOUS
  Administered 2021-02-08: 2 mg via INTRAVENOUS

## 2021-02-08 MED ORDER — HEPARIN SOD (PORK) LOCK FLUSH 100 UNIT/ML IV SOLN
INTRAVENOUS | Status: AC
  Filled 2021-02-08: qty 5

## 2021-02-08 MED ORDER — FENTANYL CITRATE (PF) 100 MCG/2ML IJ SOLN
INTRAMUSCULAR | Status: AC | PRN
  Administered 2021-02-08 (×2): 50 ug via INTRAVENOUS

## 2021-02-08 MED ORDER — MIDAZOLAM HCL 2 MG/2ML IJ SOLN
INTRAMUSCULAR | Status: AC
  Filled 2021-02-08: qty 2

## 2021-02-08 NOTE — H&P (Signed)
Chief Complaint: Patient was seen in consultation today for port placement at the request of [place]  Referring Physician(s): [place]  Supervising Physician: Sandi Mariscal  Patient Status: [name] - Out-pt  History of Present Illness: Catherine Rogers is a 60 y.o. female with ovarian cancer. She is referred for port placement. PMHx, meds, labs, imaging, allergies reviewed. Feels well, no recent fevers, chills, illness. Has been NPO today as directed.    Past Medical History:  Diagnosis Date  . Anemia   . Anxiety   . Arthritis   . Asthma   . Cancer ([place]) dx'd 08/2014   colon and ovarian  . Complication of anesthesia    woke and saw long tube coming out of mouth . sat up then went back to sleep  . Depression   . Ectopic pregnancy   . Family history of adverse reaction to anesthesia    sister on dialysis- hypotension   . Family history of ovarian cancer   . Head injury, closed, with brief LOC ([place])    had a cut- as a child  . Headache   . Hepatitis    being treated for Hep C  . History of blood transfusion    2005 approximately   . Hypertension    no meds now  . PAD (peripheral artery disease) ([place]) 04/18/2015  . Pelvic cyst   . PONV (postoperative nausea and vomiting)   . PTSD (post-traumatic stress disorder)     Past Surgical History:  Procedure Laterality Date  . ABDOMINAL HYSTERECTOMY    . BREAST BIOPSY Right 04/02/2017   Procedure: INCISIONAL BIOPSY RIGHT NIPPLE;  Surgeon: Coralie Keens, MD;  Location: [place];  Service: General;  Laterality: Right;  . COLONOSCOPY    . ESOPHAGOGASTRODUODENOSCOPY N/A 09/17/2014   Procedure: ESOPHAGOGASTRODUODENOSCOPY (EGD);  Surgeon: Jeryl Columbia, MD;  Location[facility] ENDOSCOPY;  Service: Endoscopy;  Laterality: N/A;  . FEMORAL-FEMORAL BYPASS GRAFT Bilateral 04/18/2015   Procedure:  RIGHT COMMON FEMORAL TO LEFT COMMON  FEMORAL  ARTERY BYPASS WITH  8 MM HEMASHIELD GRAFT,RIGHT COMMON SUPERFICIAL FEMORAL  ARTERY ENDARTERECTOMY;  Surgeon: Elam Dutch, MD;  Location: [place];  Service: Vascular;  Laterality: Bilateral;  . ILEOSTOMY Right 09/18/2014   Procedure: ILEOCOLOSTOMY;  Surgeon: Doreen Salvage, MD;  Location: [place];  Service: General;  Laterality: Right;  . LAPAROTOMY N/A 11/01/2014   Procedure: EXPLORATORY LAPAROTOMY;  Surgeon: Doreen Salvage, MD;  Location: [place];  Service: General;  Laterality: N/A;  . LAPAROTOMY N/A 12/26/2014   Procedure:  LAPAROTOMY ;  Surgeon: Everitt Amber, MD;  Location: [facility];  Service: Gynecology;  Laterality: N/A;  . MASS EXCISION  11/01/2014   Procedure: EXCISION PELVIC MASS;  Surgeon: Doreen Salvage, MD;  Location: [place];  Service: General;;  . OOPHORECTOMY  2016  . PARTIAL COLECTOMY Right 09/18/2014   Procedure: RIGHT HEMI COLECTOMY;  Surgeon: Doreen Salvage, MD;  Location: [place];  Service: General;  Laterality: Right;  . ROBOTIC ASSISTED TOTAL HYSTERECTOMY WITH BILATERAL SALPINGO OOPHERECTOMY Right 12/26/2014   Procedure: EXPLORATORY LAPAROSCOPY  RIGHT SALPINGO OOPHORECTOMY OMENTECTOMY LYMPHADECTOMY ;  Surgeon: Everitt Amber, MD;  Location: [facility];  Service: Gynecology;  Laterality: Right;  . TUBAL LIGATION      Allergies: Other, Demerol [meperidine], Morphine and related, Oxycodone, Penicillins, Strawberry flavor, Tylenol [acetaminophen], and Ibuprofen  Medications: Prior to Admission medications   Medication Sig Start Date End Date Taking? Authorizing Provider  HYDROcodone-acetaminophen (NORCO/VICODIN) 5-325 MG tablet Take 1 tablet by mouth every 6 (  six) hours as needed for moderate pain. 01/29/21  Yes Gorsuch, Ni, MD  losartan (COZAAR) 50 MG tablet Take 1 tablet (50 mg total) by mouth daily. 07/13/20  Yes Marrian Salvage, FNP  ondansetron (ZOFRAN) 8 MG tablet TAKE 1 TABLET(8 MG) BY MOUTH EVERY 8 HOURS AS NEEDED FOR NAUSEA OR VOMITING 10/23/20  Yes Gorsuch, Ni, MD  ondansetron (ZOFRAN) 8 MG tablet Take 1 tablet (8 mg total) by mouth every 8 (eight) hours as needed. Start on  the third day after chemotherapy. 01/29/21  Yes Gorsuch, Ni, MD  pregabalin (LYRICA) 200 MG capsule Take 1 capsule (200 mg total) by mouth every 8 (eight) hours. Please give patient brand name Lyrica 09/06/20  Yes Marcial Pacas, MD  acetaminophen (TYLENOL) 325 MG tablet Take 325 mg by mouth as needed.    [provider]  docusate sodium 100 MG CAPS Take 100 mg by mouth 2 (two) times daily as needed for mild constipation. 09/22/14   Nat Christen, PA-C  lidocaine (LIDODERM) 5 % PLACE 1 PATCH ONTO THE SKIN EVERY DAY.REMOVE AND DISCARD PATCH WITHIN 12 HOURS OR AS DIRECTED BY PRESCRIBER 06/01/19   Truitt Merle, MD  lidocaine-prilocaine (EMLA) cream Apply to affected area once 01/29/21   Heath Lark, MD  prochlorperazine (COMPAZINE) 10 MG tablet TAKE 1 TABLET(10 MG) BY MOUTH EVERY 6 HOURS AS NEEDED FOR NAUSEA OR VOMITING 02/07/20   Heath Lark, MD  prochlorperazine (COMPAZINE) 10 MG tablet Take 1 tablet (10 mg total) by mouth every 6 (six) hours as needed (Nausea or vomiting). 01/29/21   Heath Lark, MD     Family History  Problem Relation Age of Onset  . Brain cancer Sister 61  . Lung cancer Brother 42  . Heart attack Mother   . Heart attack Father        5s  . Heart disease Father   . Heart attack Maternal Uncle   . Diabetes Maternal Uncle   . Breast cancer Maternal Uncle   . Ovarian cancer Paternal Aunt        3 paternal aunts with ovarian cancer  . Heart attack Brother   . Diabetes Paternal Uncle     Social History   Socioeconomic History  . Marital status: Married    Spouse name: Juanda Crumble  . Number of children: 6  . Years of education: 78  . Highest education level: High school graduate  Occupational History  . Occupation: Disability  Tobacco Use  . Smoking status: Current Some Day Smoker    Years: 10.00    Types: Cigars  . Smokeless tobacco: Never Used  . Tobacco comment: pack per month of cigars = 5  Vaping Use  . Vaping Use: Never used  Substance and Sexual Activity  .  Alcohol use: No  . Drug use: Not Currently    Frequency: 5.0 times per week    Types: Marijuana    Comment: last smoked 09/22/17  . Sexual activity: Never  Other Topics Concern  . Not on file  Social History Narrative   Born and raised in [place], [place]. Currently resides in a house by herself. No pets. Fun: Read, shoot pool, poetry, Journal, knitting   Denies religious beliefs that would effect health care.    Lives at home with her son.   Right-handed.   No caffeine use.   Denies abuse and feels safe at home.    Social Determinants of Health   Financial Resource Strain: Not on file  Food  Insecurity: Not on file  Transportation Needs: Not on file  Physical Activity: Not on file  Stress: Not on file  Social Connections: Not on file    Review of Systems: A 12 point ROS discussed and pertinent positives are indicated in the HPI above.  All other systems are negative.  Review of Systems  Vital Signs: BP (!) 167/83   Pulse 84   Temp 98.2 F (36.8 C) (Oral)   Resp 18   Ht 5\' 7"  (1.702 m)   Wt 62.6 kg   SpO2 98%   BMI 21.61 kg/m   Physical Exam Constitutional:      Appearance: Normal appearance. She is not ill-appearing.  HENT:     Mouth/Throat:     Mouth: Mucous membranes are moist.     Pharynx: Oropharynx is clear.  Cardiovascular:     Rate and Rhythm: Normal rate and regular rhythm.     Heart sounds: Normal heart sounds.  Pulmonary:     Effort: Pulmonary effort is normal. No respiratory distress.     Breath sounds: Normal breath sounds.  Neurological:     General: No focal deficit present.     Mental Status: She is alert and oriented to person, place, and time.  Psychiatric:        Mood and Affect: Mood normal.        Thought Content: Thought content normal.        Judgment: Judgment normal.     Imaging: CT CHEST ABDOMEN PELVIS W CONTRAST  Result Date: 01/28/2021 CLINICAL DATA:  Restaging of colon cancer and ovarian cancer. Right hemicolectomy  09/18/2014, hysterectomy and oophorectomy 12/26/2014. EXAM: CT CHEST, ABDOMEN, AND PELVIS WITH CONTRAST TECHNIQUE: Multidetector CT imaging of the chest, abdomen and pelvis was performed following the standard protocol during bolus administration of intravenous contrast. CONTRAST:  167mL OMNIPAQUE IOHEXOL 300 MG/ML  SOLN COMPARISON:  CT abdomen and pelvis October 22, 2021 and CT chest August 25, 2019 FINDINGS: CT CHEST FINDINGS Cardiovascular: No thoracic aortic aneurysm. The no central pulmonary embolus. Normal size heart. No pericardial effusion. Coronary artery calcifications. Mediastinum/Nodes: No enlarged mediastinal, hilar, or axillary lymph nodes. Thyroid gland, trachea, and esophagus demonstrate no significant findings. Lungs/Pleura: Mild emphysematous change. Stable 4 mm pulmonary nodule in the right upper lobe image 60/4 and 3 mm nodule in the left lower lobe image 87/4. No suspicious pulmonary nodules or masses. No pneumothorax. No pleural effusion. Musculoskeletal: No chest wall mass or suspicious bone lesions identified. CT ABDOMEN PELVIS FINDINGS Hepatobiliary: No suspicious hepatic lesion. Gallbladder is unremarkable. No biliary ductal dilation. Pancreas: Unremarkable. Spleen: Unremarkable. Adrenals/Urinary Tract: Bilateral adrenal glands are unremarkable. No hydronephrosis. Urinary bladder is unremarkable for degree of distension. Stomach/Bowel: Similar changes of right hemicolectomy. Similar band like density along the rectosigmoid mesentery measuring 1 cm in thickness, on image 104/2 unchanged and suggestive of scarring versus treated tumor. Adjacent to this area thickening there are 2 low-density well-circumscribed areas 1 of which measures 1.1 cm on image 102/2 the other of which measures 1.8 cm on image 104/2, it is difficult to ascertain whether these arise directly from the bowel wall or are within the peritoneum. No evidence of bowel obstruction. 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 Aortic atherosclerosis. Reproductive: Status post hysterectomy. No adnexal masses. 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 Multilevel degenerative change of the spine no acute osseous abnormality. IMPRESSION: 1. Changes of right hemicolectomy with slightly decreased size of the nodule along the right pericolic gutter and similar band like density along the rectosigmoid mesentery. 2. Two new well-circumscribed low-density nodules in the presacral perirectal space, difficult to ascertain whether they arise from the bowel wall or arise from within the peritoneum. This is a nonspecific finding which could be further evaluated with colonoscopy, repeat CT with rectal contrast or attention on close interval follow-up imaging in 3 months. 3. 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 4. 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 5. Emphysema and aortic atherosclerosis. Aortic Atherosclerosis (ICD10-I70.0) and Emphysema (ICD10-J43.9). Electronically Signed   By: Dahlia Bailiff MD   On: 01/28/2021 15:54    Labs:  CBC: Recent Labs    06/14/20 1005 08/15/20 1121 10/23/20 1320 01/21/21 1145  WBC 6.0 5.4 4.0 5.9  HGB 12.4 12.2 13.1 12.1  HCT 34.8* 33.7* 35.7* 33.0*  PLT 110* 124* 110* 140*    COAGS: No results for input(s): INR, APTT in the last 8760 hours.  BMP: Recent Labs    03/23/20 1320 04/20/20 0926 06/14/20 1005 08/15/20 1121 10/22/20 1356 10/23/20 1320 01/21/21 1145  NA 142 143 140 143  --  143 139  K 3.8 3.9 3.9 3.8  --  4.2 3.6  CL 105 104 105 106  --  106 105  CO2 29 28 26 31   --  28 26  GLUCOSE  108* 96 92 71  --  94 99  BUN 16 17 18 17   --  7 12  CALCIUM 8.9 9.6 10.1 9.2  --  10.2 9.4  CREATININE 1.03* 1.04* 1.06* 0.94 1.10* 1.01* 1.07*  GFRNONAA 59* 59* 57* >60  --  >60 60*  GFRAA >60 >60 >60 >60  --   --   --     LIVER FUNCTION TESTS: Recent Labs    06/14/20 1005 08/15/20 1121 10/23/20 1320 01/21/21 1145  BILITOT 0.5 0.4 0.8 0.4  AST 16 14* 17 15  ALT 8 9 7 6   ALKPHOS 71 74 60 70  PROT 7.2 7.2 7.4 7.4  ALBUMIN 4.2 4.1 4.5 4.2    TUMOR MARKERS: No results for input(s): AFPTM, CEA, CA199, CHROMGRNA in the last 8760 hours.  Assessment and Plan: Ovarian cancer For port placement Risks and benefits of image guided port-a-catheter placement was discussed with the patient including, but not limited to bleeding, infection, pneumothorax, or fibrin sheath development and need for additional procedures.  All of the patient's questions were answered, patient is agreeable to proceed. Consent signed and in chart.    Thank you for this interesting consult.  I greatly enjoyed meeting Catherine Rogers and look forward to participating in their care.  A copy of this report was sent to the requesting provider on this date.  Electronically Signed: Ascencion Dike, PA-C 02/08/2021, 1:27 PM   I spent a total of 20 minutes in face to face in clinical consultation, greater than 50% of which was counseling/coordinating care for port placement

## 2021-02-08 NOTE — Discharge Instructions (Signed)
You may remove your dressing in 24 hours and shower at this time. Do not use EMLA cream on port incision site for 2 weeks. For problems and concerns, please call Interventional Radiology after hours at [phone].   Implanted Port Insertion, Care After This sheet gives you information about how to care for yourself after your procedure. Your health care provider may also give you more specific instructions. If you have problems or questions, contact your health care provider. What can I expect after the procedure? After the procedure, it is common to have:  Discomfort at the port insertion site.  Bruising on the skin over the port. This should improve over 3-4 days. Follow these instructions at home: [facility] care  After your port is placed, you will get a manufacturer's information card. The card has information about your port. Keep this card with you at all times.  Take care of the port as told by your health care provider. Ask your health care provider if you or a family member can get training for taking care of the port at home. A home health care nurse may also take care of the port.  Make sure to remember what type of port you have. Incision care  Follow instructions from your health care provider about how to take care of your port insertion site. Make sure you: ? Wash your hands with soap and water before and after you change your bandage (dressing). If soap and water are not available, use hand sanitizer. ? Change your dressing as told by your health care provider. ? Leave stitches (sutures), skin glue, or adhesive strips in place. These skin closures may need to stay in place for 2 weeks or longer. If adhesive strip edges start to loosen and curl up, you may trim the loose edges. Do not remove adhesive strips completely unless your health care provider tells you to do that.  Check your port insertion site every day for signs of infection. Check for: ? Redness, swelling, or  pain. ? Fluid or blood. ? Warmth. ? Pus or a bad smell.      Activity  Return to your normal activities as told by your health care provider. Ask your health care provider what activities are safe for you.  Do not lift anything that is heavier than 10 lb (4.5 kg), or the limit that you are told, until your health care provider says that it is safe. General instructions  Take over-the-counter and prescription medicines only as told by your health care provider.  Do not take baths, swim, or use a hot tub until your health care provider approves. Ask your health care provider if you may take showers. You may only be allowed to take sponge baths.  Do not drive for 24 hours if you were given a sedative during your procedure.  Wear a medical alert bracelet in case of an emergency. This will tell any health care providers that you have a port.  Keep all follow-up visits as told by your health care provider. This is important. Contact a health care provider if:  You cannot flush your port with saline as directed, or you cannot draw blood from the port.  You have a fever or chills.  You have redness, swelling, or pain around your port insertion site.  You have fluid or blood coming from your port insertion site.  Your port insertion site feels warm to the touch.  You have pus or a bad smell coming from the  port insertion site. Get help right away if:  You have chest pain or shortness of breath.  You have bleeding from your port that you cannot control. Summary  Take care of the port as told by your health care provider. Keep the manufacturer's information card with you at all times.  Change your dressing as told by your health care provider.  Contact a health care provider if you have a fever or chills or if you have redness, swelling, or pain around your port insertion site.  Keep all follow-up visits as told by your health care provider. This information is not intended to  replace advice given to you by your health care provider. Make sure you discuss any questions you have with your health care provider. Document Revised: 06/08/2018 Document Reviewed: 06/08/2018 Elsevier Patient Education  2021 [place].    Moderate Conscious Sedation, Adult, Care After This sheet gives you information about how to care for yourself after your procedure. Your health care provider may also give you more specific instructions. If you have problems or questions, contact your health care provider. What can I expect after the procedure? After the procedure, it is common to have:  Sleepiness for several hours.  Impaired judgment for several hours.  Difficulty with balance.  Vomiting if you eat too soon. Follow these instructions at home: For the time period you were told by your health care provider:  Rest.  Do not participate in activities where you could fall or become injured.  Do not drive or use machinery.  Do not drink alcohol.  Do not take sleeping pills or medicines that cause drowsiness.  Do not make important decisions or sign legal documents.  Do not take care of children on your own.      Eating and drinking  Follow the diet recommended by your health care provider.  Drink enough fluid to keep your urine pale yellow.  If you vomit: ? Drink water, juice, or soup when you can drink without vomiting. ? Make sure you have little or no nausea before eating solid foods.   General instructions  Take over-the-counter and prescription medicines only as told by your health care provider.  Have a responsible adult stay with you for the time you are told. It is important to have someone help care for you until you are awake and alert.  Do not smoke.  Keep all follow-up visits as told by your health care provider. This is important. Contact a health care provider if:  You are still sleepy or having trouble with balance after 24 hours.  You feel  light-headed.  You keep feeling nauseous or you keep vomiting.  You develop a rash.  You have a fever.  You have redness or swelling around the IV site. Get help right away if:  You have trouble breathing.  You have new-onset confusion at home. Summary  After the procedure, it is common to feel sleepy, have impaired judgment, or feel nauseous if you eat too soon.  Rest after you get home. Know the things you should not do after the procedure.  Follow the diet recommended by your health care provider and drink enough fluid to keep your urine pale yellow.  Get help right away if you have trouble breathing or new-onset confusion at home. This information is not intended to replace advice given to you by your health care provider. Make sure you discuss any questions you have with your health care provider. Document Revised: 03/09/2020  Document Reviewed: 10/06/2019 Elsevier Patient Education  [place].

## 2021-02-08 NOTE — Procedures (Signed)
Pre Procedure Dx: Poor venous access Post Procedural Dx: Same  Successful placement of right IJ approach port-a-cath with tip at the superior caval atrial junction. The catheter is ready for immediate use.  Estimated Blood Loss: Minimal  Complications: None immediate.  Jay Kyndal Gloster, MD Pager #: [phone]   

## 2021-02-11 ENCOUNTER — Encounter: Payer: Self-pay | Admitting: Hematology and Oncology

## 2021-02-11 ENCOUNTER — Inpatient Hospital Stay: Payer: 59

## 2021-02-11 ENCOUNTER — Other Ambulatory Visit: Payer: 59

## 2021-02-11 ENCOUNTER — Inpatient Hospital Stay (HOSPITAL_BASED_OUTPATIENT_CLINIC_OR_DEPARTMENT_OTHER): Payer: 59 | Admitting: Hematology and Oncology

## 2021-02-11 ENCOUNTER — Other Ambulatory Visit: Payer: Self-pay

## 2021-02-11 ENCOUNTER — Other Ambulatory Visit: Payer: Self-pay | Admitting: Hematology and Oncology

## 2021-02-11 DIAGNOSIS — G893 Neoplasm related pain (acute) (chronic): Secondary | ICD-10-CM | POA: Diagnosis not present

## 2021-02-11 DIAGNOSIS — D61818 Other pancytopenia: Secondary | ICD-10-CM | POA: Diagnosis not present

## 2021-02-11 DIAGNOSIS — C562 Malignant neoplasm of left ovary: Secondary | ICD-10-CM

## 2021-02-11 DIAGNOSIS — D539 Nutritional anemia, unspecified: Secondary | ICD-10-CM | POA: Insufficient documentation

## 2021-02-11 DIAGNOSIS — Z5111 Encounter for antineoplastic chemotherapy: Secondary | ICD-10-CM | POA: Diagnosis not present

## 2021-02-11 MED ORDER — SODIUM CHLORIDE 0.9% FLUSH
10.0000 mL | INTRAVENOUS | Status: DC | PRN
  Administered 2021-02-11: 10 mL
  Filled 2021-02-11: qty 10

## 2021-02-11 MED ORDER — DIPHENHYDRAMINE HCL 50 MG/ML IJ SOLN
25.0000 mg | Freq: Once | INTRAMUSCULAR | Status: AC
  Administered 2021-02-11: 25 mg via INTRAVENOUS

## 2021-02-11 MED ORDER — DIPHENHYDRAMINE HCL 50 MG/ML IJ SOLN
INTRAMUSCULAR | Status: AC
  Filled 2021-02-11: qty 1

## 2021-02-11 MED ORDER — HEPARIN SOD (PORK) LOCK FLUSH 100 UNIT/ML IV SOLN
500.0000 [IU] | Freq: Once | INTRAVENOUS | Status: AC | PRN
  Administered 2021-02-11: 500 [IU]
  Filled 2021-02-11: qty 5

## 2021-02-11 MED ORDER — PALONOSETRON HCL INJECTION 0.25 MG/5ML
0.2500 mg | Freq: Once | INTRAVENOUS | Status: AC
  Administered 2021-02-11: 0.25 mg via INTRAVENOUS

## 2021-02-11 MED ORDER — SODIUM CHLORIDE 0.9 % IV SOLN
10.0000 mg | Freq: Once | INTRAVENOUS | Status: AC
  Administered 2021-02-11: 10 mg via INTRAVENOUS
  Filled 2021-02-11: qty 10

## 2021-02-11 MED ORDER — GEMCITABINE HCL CHEMO INJECTION 1 GM/26.3ML
800.0000 mg/m2 | Freq: Once | INTRAVENOUS | Status: AC
  Administered 2021-02-11: 1368 mg via INTRAVENOUS
  Filled 2021-02-11: qty 35.98

## 2021-02-11 MED ORDER — CARBOPLATIN CHEMO INJECTION 600 MG/60ML
395.6000 mg | Freq: Once | INTRAVENOUS | Status: AC
  Administered 2021-02-11: 400 mg via INTRAVENOUS
  Filled 2021-02-11: qty 40

## 2021-02-11 MED ORDER — PALONOSETRON HCL INJECTION 0.25 MG/5ML
INTRAVENOUS | Status: AC
  Filled 2021-02-11: qty 5

## 2021-02-11 MED ORDER — FAMOTIDINE IN NACL 20-0.9 MG/50ML-% IV SOLN
20.0000 mg | Freq: Once | INTRAVENOUS | Status: AC
  Administered 2021-02-11: 20 mg via INTRAVENOUS

## 2021-02-11 MED ORDER — SODIUM CHLORIDE 0.9 % IV SOLN
150.0000 mg | Freq: Once | INTRAVENOUS | Status: AC
  Administered 2021-02-11: 150 mg via INTRAVENOUS
  Filled 2021-02-11: qty 150

## 2021-02-11 MED ORDER — SODIUM CHLORIDE 0.9 % IV SOLN
Freq: Once | INTRAVENOUS | Status: AC
  Filled 2021-02-11: qty 250

## 2021-02-11 MED ORDER — FAMOTIDINE IN NACL 20-0.9 MG/50ML-% IV SOLN
INTRAVENOUS | Status: AC
  Filled 2021-02-11: qty 50

## 2021-02-11 NOTE — Progress Notes (Signed)
Okay to tx with ANC of 1.4 per Dr. Alvy Bimler.

## 2021-02-11 NOTE — Progress Notes (Signed)
[facility] OFF[facility] PROGRESS NOTE  Patient Care Team: Marrian Salvage, [place] as PCP - General (Internal Medicine) Judeth Horn, MD as Consulting Physician (General Surgery) Nat Math, MD as Referring Physician (Obstetrics and Gynecology) Elam Dutch, MD as Consulting Physician (Vascular Surgery) Heath Lark, MD as Consulting Physician (Hematology and Oncology)  ASSESSMENT & PLAN:  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 [facility]) We discussed again the risk, benefits, side effects of treatment and she is in agreement Due to recent port placement, I will hold bevacizumab today and will proceed with chemotherapy only   Pancytopenia, acquired [facility]) She has persistent pancytopenia likely due to residual side effects from recent treatment Observe closely and I plan to recheck vitamin B12 and iron studies in the next visit We will proceed with treatment without delay  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 Iron and TIBC    Standing Status:   Future    Standing Expiration Date:   02/11/2022  . Ferritin    Standing Status:   Future    Standing Expiration Date:   02/11/2022  . Vitamin B12    Standing Status:   Future    Standing Expiration Date:   02/11/2022    All questions were answered. The patient knows to call the clinic with any problems, questions or concerns. The total time spent in the appointment was 20 minutes encounter with patients including review of chart and various tests results, discussions about plan of care and coordination of care plan   Heath Lark, MD 02/11/2021 3:28 PM  INTERVAL HISTORY: Please see below for problem oriented charting. She returns for treatment and follow-up She tolerated port placement okay Denies abdominal pain, changes in bowel habits or nausea The patient denies any recent signs or symptoms of bleeding such as  spontaneous epistaxis, hematuria or hematochezia.   SUMMARY OF ONCOLOGIC HISTORY: Oncology History Overview Note  BRCA1 positive   Cancer of right colon ([place])  09/16/2014 Imaging   CT abd/pel:  a 3.8cm cecal mass with associated ileocecal intussusception, and a 9.1cm solid and cystic mass in the left pelvis,   09/18/2014 Surgery   right hemicolectomy and terminal ileoectomy   09/18/2014 Pathologic Stage   pT3pN1Mx, tumor extends into pericolonic soft tissue and is less than 43m from the serosal surface, LVI 8-), PNI (-), all 23 node negative, one soft tissue tumor deposit, surgical margins negative.    09/18/2014 Initial Diagnosis   Adenocarcinoma of right colon   08/06/2016 Imaging   CT chest/abd/pelvis with contrast IMPRESSION: 1. No recurrent malignancy identified. 2. 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 On the prior exam from 09/13/2015 there was back flow of contrast in the left external iliac artery to supply the left internal iliac artery ; this back flow of contrast is no longer present in the left external iliac artery is occluded. 3. Several pulmonary nodules at or below 4 mm diameter, unchanged. 4. 3 mm hypodensity posteriorly in the body of the pancreas, not well seen previously and subtle today, likely incidental, but merits observation. 5. 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 Prominent degenerative spurring of both acetabula. 6. Lumbar spondylosis and degenerative disc disease causing prominent impingement at L4-5 and mild impingement at L5-S1.   03/08/2017 Imaging   CT CAP W Contrast 03/08/17 IMPRESSION: Chest  Impression: 1. Stable small pulmonary nodules within LEFT lung. 2. No mediastinal lymphadenopathy.  Abdomen / Pelvis Impression: 1. No evidence of metastatic colorectal cancer or ovarian cancer within the abdomen pelvis. 2. No lymphadenopathy. 3. Postsurgical  change consistent RIGHT hemicolectomy and hysterectomy 4. A fem-fem arterial bypass noted.   11/13/2017 Imaging   CT AP w contrast IMPRESSION: 1. No findings of recurrent malignancy in the abdomen or pelvis. 2. Other imaging findings of potential clinical significance:Chronic AVN of both femoral heads without contour abnormality. Lumbar impingement at L4-5 and L5-S1. Aortic Atherosclerosis (ICD10-I70.0). 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 Partial right hemicolectomy. Hysterectomy.   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 ([place])  10/09/2014 Imaging   CT of the abdomen and pelvis showed a 9.1 cm solid and cystic mass in the left pelvis concerning for malignancy. This was discovered during her workup for colon cancer.   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 Tumor size 3.7 cm, no lymph nodes removed. T1cNx. Tumor cells are positive for cytokeratin 7 and estrogen receptor.   11/01/2014 Surgery   B/l Salpingo-oophorectomy.   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 Stable small pulmonary nodules within LEFT lung. 2. No mediastinal lymphadenopathy. Abdomen / Pelvis Impression: 1. No evidence of metastatic colorectal cancer or ovarian cancer within the abdomen pelvis. 2. No lymphadenopathy. 3. Postsurgical change consistent RIGHT hemicolectomy and hysterectomy 4. A fem-fem arterial bypass noted.   03/12/2017 Mammogram   MM Right Breast 03/12/17 IMPRESSION: No mammographic or sonographic evidence of malignancy. No correlate identified for the abnormal enhancement  seen within the right nipple on MRI of 02/27/2017.    04/02/2017 Pathology Results   Surgical Pathology of Nipple Biopsy: 04/02/17 Diagnosis Nipple Biopsy, Right - BENIGN NIPPLE TISSUE. - NO ATYPIA OR MALIGNANCY.    11/13/2017 Imaging   1. No findings of recurrent malignancy in the abdomen or pelvis. 2. Other imaging findings of potential clinical significance: Chronic AVN of both femoral heads without contour abnormality. Lumbar impingement at L4-5 and L5-S1. Aortic Atherosclerosis (ICD10-I70.0). 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 Partial right hemicolectomy. Hysterectomy.   03/22/2018 Mammogram   Screening mammogram IMPRESSION No evidence of malignancy.     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   06/06/2019 Imaging   1. Tiny bilateral pulmonary nodules stable since 11/12/2018. Continued close attention on follow-up recommended. 2. Incompletely visualized upper abdominal lymphadenopathy better characterized on the abdomen CT from 2 weeks ago. 3.  Aortic Atherosclerois (ICD10-170.0) 4.  Emphysema. ([id number]-M76.9)   06/07/2019 Procedure   Technically successful CT guided core needle biopsy of indeterminate peritoneal nodule within the right lower abdomen/pelvis.   06/07/2019 Pathology Results   Soft Tissue Needle Core Biopsy, peritoneal nodule within right lower abdomen - METASTATIC CARCINOMA CONSISTENT WITH PATIENT'S CLINICAL HISTORY OF PRIMARY OVARIAN CARCINOMA. SEE  NOTE Diagnosis Note Immunohistochemical stains show that the tumor cells are positive for CK7, ER and PAX 8; and negative for CK20 and CDX2. This immunostaining profile is consistent with above diagnosis.   06/16/2019  Tumor Marker   Patient's tumor was tested for the following markers: CA-125 Results of the tumor marker test revealed 499   06/20/2019 - 11/14/2019 Chemotherapy   The patient had carboplatin and taxol   07/11/2019 Tumor Marker   Patient's tumor was tested for the following markers: CA-125 Results of the tumor marker test revealed 438   08/05/2019 Tumor Marker   Patient's tumor was tested for the following markers: CA-125 Results of the tumor marker test revealed 272.   08/25/2019 Imaging   Ct chest, abdomen and pelvis 1. Slight interval decrease in size of a right lower quadrant peritoneal nodule measuring 1.7 x 1.6 cm, previously 2.2 x 2.2 cm (series 2, image 94, series 5, image 58). Findings are consistent with treatment response.   2. No significant change in ill-defined soft tissue about the sigmoid colon in the hysterectomy bed (series 2, image 101).   3.  Status post hysterectomy.   4. No evidence of metastatic disease in the chest. Stable benign small pulmonary nodules.   5.  Coronary artery disease.  Aortic Atherosclerosis (ICD10-I70.0).     08/26/2019 Tumor Marker   Patient's tumor was tested for the following markers: CA-125 Results of the tumor marker test revealed 189.   09/26/2019 Tumor Marker   Patient's tumor was tested for the following markers: CA-125 Results of the tumor marker test revealed 137   10/03/2019 Tumor Marker   Patient's tumor was tested for the following markers: CA-125 Results of the tumor marker test revealed 137.   10/31/2019 Tumor Marker   Patient's tumor was tested for the following markers: CA-125 Results of the tumor marker test revealed 101   11/29/2019 Imaging   1. Right lower quadrant peritoneal implant is decreased. Stable irregular nodular pelvic peritoneal thickening superior to the vaginal cuff. 2. No new or progressive metastatic disease. No abdominopelvic lymphadenopathy. 3.  Aortic Atherosclerosis (ICD10-I70.0).   12/15/2019 -   Chemotherapy   The patient had Lynparza for chemotherapy treatment.     12/23/2019 Tumor Marker   Patient's tumor was tested for the following markers: CA-125 Results of the tumor marker test revealed 55.9   01/02/2020 Tumor Marker   Patient's tumor was tested for the following markers: CA-125. Results of the tumor marker test revealed 58.6   02/03/2020 Tumor Marker   Patient's tumor was tested for the following markers: CA-125 Results of the tumor marker test revealed 52.2   02/24/2020 Tumor Marker   Patient's tumor was tested for the following markers: CA-125 Results of the tumor marker test revealed 64.8   04/19/2020 Imaging   1. Continued slight interval decrease in size of a soft tissue nodule in the right paracolic gutter, consistent with treatment response. 2. Soft tissue in the hysterectomy bed near the distal sigmoid colon and rectum may be slightly decreased in size, difficult to measure due to configuration but apparently diminished in size, also consistent with treatment response. 3. Status post hysterectomy and oophorectomy as well as right colon resection. 4. No evidence of new metastatic disease in the abdomen or pelvis. 5. Redemonstrated long segment occlusion of the left common, internal, and external iliac arteries. Status post femoral-femoral bypass grafting with reconstitution of the left common femoral artery. Aortic Atherosclerosis (ICD10-I70.0).   08/15/2020 Tumor Marker   Patient's  tumor was tested for the following markers: CA-125 Results of the tumor marker test revealed 47.1   10/22/2020 Imaging   1. Reduced size of the nodule along the right paracolic gutter, currently 0.6 by 1.2 cm, previously 0.7 by 1.4 cm on 04/19/2020 and previously 0.9 by 1.5 cm on 11/29/2019. Appearance compatible with treatment response. 2. Further mild reduction in size of the bandlike density along the rectosigmoid mesentery, probably from scarring or effectively treated tumor. 3.  Prominent stool throughout the colon favors constipation.  4. Stable chronic occlusion of the left iliac arteries, with a patent fem-fem bypass. 5. 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 6. Lower lumbar spondylosis and degenerative disc disease causing impingement at L4-5 and L5-S1. 7. Aortic atherosclerosis.     10/23/2020 Tumor Marker   Patient's tumor was tested for the following markers: CA-125 Results of the tumor marker test revealed 74.6   01/21/2021 Tumor Marker   Patient's tumor was tested for the following markers: CA-125 Results of the tumor marker test revealed 825   01/28/2021 Imaging   1. Changes of right hemicolectomy with slightly decreased size of the nodule along the right pericolic gutter and similar band like density along the rectosigmoid mesentery. 2. Two new well-circumscribed low-density nodules in the presacral perirectal space, difficult to ascertain whether they arise from the bowel wall or arise from within the peritoneum. This is a nonspecific finding which could be further evaluated with colonoscopy, repeat CT with rectal contrast or attention on close interval follow-up imaging in 3 months. 3. 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 4. 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 5. Emphysema and aortic atherosclerosis. Aortic Atherosclerosis (ICD10-I70.0) and Emphysema (ICD10-J43.9).   02/08/2021 Procedure   Successful placement of a right internal jugular approach power injectable Port-A-Cath. The catheter is ready for immediate use.     02/11/2021 -  Chemotherapy    Patient is on Treatment Plan: OVARIAN RECURRENT 3RD LINE CARBOPLATIN D1 / GEMCITABINE D1,8 (4/800) Q21D WITH BEVACIZUMAB        REVIEW OF SYSTEMS:   Constitutional: Denies fevers, chills or abnormal weight loss Eyes: Denies blurriness of vision Ears,  nose, mouth, throat, and face: Denies mucositis or sore throat Respiratory: Denies cough, dyspnea or wheezes Cardiovascular: Denies palpitation, chest discomfort or lower extremity swelling Gastrointestinal:  Denies nausea, heartburn or change in bowel habits Skin: Denies abnormal skin rashes Lymphatics: Denies new lymphadenopathy or easy bruising Neurological:Denies numbness, tingling or new weaknesses Behavioral/Psych: Mood is stable, no new changes  All other systems were reviewed with the patient and are negative.  I have reviewed the past medical history, past surgical history, social history and family history with the patient and they are unchanged from previous note.  ALLERGIES:  is allergic to other, demerol [meperidine], morphine and related, oxycodone, penicillins, strawberry flavor, tylenol [acetaminophen], and ibuprofen.  MEDICATIONS:  Current Outpatient Medications  Medication Sig Dispense Refill  . acetaminophen (TYLENOL) 325 MG tablet Take 325 mg by mouth as needed.    . docusate sodium 100 MG CAPS Take 100 mg by mouth 2 (two) times daily as needed for mild constipation. 10 capsule 0  . HYDROcodone-acetaminophen (NORCO/VICODIN) 5-325 MG tablet Take 1 tablet by mouth every 6 (six) hours as needed for moderate pain. 90 tablet 0  . lidocaine (LIDODERM) 5 % PLACE 1 PATCH ONTO THE SKIN EVERY DAY.REMOVE AND DISCARD [facility]  WITHIN 12 HOURS OR AS DIRECTED BY PRESCRIBER 30 patch 1  . lidocaine-prilocaine (EMLA) cream Apply to affected area once 30 g 3  . losartan (COZAAR) 50 MG tablet Take 1 tablet (50 mg total) by mouth daily. 90 tablet 3  . ondansetron (ZOFRAN) 8 MG tablet TAKE 1 TABLET(8 MG) BY MOUTH EVERY 8 HOURS AS NEEDED FOR NAUSEA OR VOMITING 90 tablet 3  . ondansetron (ZOFRAN) 8 MG tablet Take 1 tablet (8 mg total) by mouth every 8 (eight) hours as needed. Start on the third day after chemotherapy. 30 tablet 1  . pregabalin (LYRICA) 200 MG capsule Take 1 capsule (200 mg total)  by mouth every 8 (eight) hours. Please give patient brand name Lyrica 270 capsule 4  . prochlorperazine (COMPAZINE) 10 MG tablet TAKE 1 TABLET(10 MG) BY MOUTH EVERY 6 HOURS AS NEEDED FOR NAUSEA OR VOMITING 90 tablet 1  . prochlorperazine (COMPAZINE) 10 MG tablet Take 1 tablet (10 mg total) by mouth every 6 (six) hours as needed (Nausea or vomiting). 30 tablet 1   No current facility-administered medications for this visit.   Facility-Administered Medications Ordered in Other Visits  Medication Dose Route Frequency Provider Last Rate Last Admin  . CARBOplatin (PARAPLATIN) 400 mg in sodium chloride 0.9 % 250 mL chemo infusion  400 mg Intravenous Once Alvy Bimler, Sahar Ryback, MD      . heparin lock flush 100 unit/mL  500 Units Intracatheter Once PRN Alvy Bimler, Alyzza Andringa, MD      . sodium chloride flush (NS) 0.9 % injection 10 mL  10 mL Intracatheter PRN Alvy Bimler, Evangelene Vora, MD        PHYSICAL EXAMINATION: ECOG PERFORMANCE STATUS: 1 - Symptomatic but completely ambulatory  Vitals:   02/11/21 1115  BP: 130/73  Pulse: 72  Resp: 18  Temp: 98.2 F (36.8 C)  SpO2: 100%   Filed Weights   02/11/21 1115  Weight: 140 lb 12.8 oz (63.9 kg)    GENERAL:alert, no distress and comfortable SKIN: I remove bandages from her port site.  There are multiple Steri-Strips and glue over her port which I did not remove today NEURO: alert & oriented x 3 with fluent speech, no focal motor/sensory deficits  LABORATORY DATA:  I have reviewed the data as listed    Component Value Date/Time   NA 143 02/08/2021 1320   NA 141 10/07/2017 1436   K 3.6 02/08/2021 1320   K 4.4 10/07/2017 1436   CL 108 02/08/2021 1320   CO2 24 02/08/2021 1320   CO2 26 10/07/2017 1436   GLUCOSE 97 02/08/2021 1320   GLUCOSE 85 10/07/2017 1436   BUN 11 02/08/2021 1320   BUN 14.9 10/07/2017 1436   CREATININE 0.76 02/08/2021 1320   CREATININE 1.07 (H) 01/21/2021 1145   CREATININE 1.0 10/07/2017 1436   CALCIUM 9.2 02/08/2021 1320   CALCIUM 9.5 10/07/2017  1436   PROT 7.5 02/08/2021 1320   PROT 7.8 10/07/2017 1436   ALBUMIN 4.5 02/08/2021 1320   ALBUMIN 3.9 10/07/2017 1436   AST 16 02/08/2021 1320   AST 15 01/21/2021 1145   AST 15 10/07/2017 1436   ALT 10 02/08/2021 1320   ALT 6 01/21/2021 1145   ALT 7 10/07/2017 1436   ALKPHOS 52 02/08/2021 1320   ALKPHOS 81 10/07/2017 1436   BILITOT 0.8 02/08/2021 1320   BILITOT 0.4 01/21/2021 1145   BILITOT 0.54 10/07/2017 1436   GFRNONAA >60 02/08/2021 1320   GFRNONAA 60 (L) 01/21/2021 1145   GFRAA >60 08/15/2020 1121  No results found for: SPEP, UPEP  Lab Results  Component Value Date   WBC 3.7 (L) 02/08/2021   NEUTROABS 1.4 (L) 02/08/2021   HGB 13.1 02/08/2021   HCT 36.5 02/08/2021   MCV 112.3 (H) 02/08/2021   PLT 132 (L) 02/08/2021      Chemistry      Component Value Date/Time   NA 143 02/08/2021 1320   NA 141 10/07/2017 1436   K 3.6 02/08/2021 1320   K 4.4 10/07/2017 1436   CL 108 02/08/2021 1320   CO2 24 02/08/2021 1320   CO2 26 10/07/2017 1436   BUN 11 02/08/2021 1320   BUN 14.9 10/07/2017 1436   CREATININE 0.76 02/08/2021 1320   CREATININE 1.07 (H) 01/21/2021 1145   CREATININE 1.0 10/07/2017 1436      Component Value Date/Time   CALCIUM 9.2 02/08/2021 1320   CALCIUM 9.5 10/07/2017 1436   ALKPHOS 52 02/08/2021 1320   ALKPHOS 81 10/07/2017 1436   AST 16 02/08/2021 1320   AST 15 01/21/2021 1145   AST 15 10/07/2017 1436   ALT 10 02/08/2021 1320   ALT 6 01/21/2021 1145   ALT 7 10/07/2017 1436   BILITOT 0.8 02/08/2021 1320   BILITOT 0.4 01/21/2021 1145   BILITOT 0.54 10/07/2017 1436       RADIOGRAPHIC STUDIES: I have personally reviewed the radiological images as listed and agreed with the findings in the report. CT CHEST ABDOMEN PELVIS W CONTRAST  Result Date: 01/28/2021 CLINICAL DATA:  Restaging of colon cancer and ovarian cancer. Right hemicolectomy 09/18/2014, hysterectomy and oophorectomy 12/26/2014. EXAM: CT CHEST, ABDOMEN, AND PELVIS WITH CONTRAST  TECHNIQUE: Multidetector CT imaging of the chest, abdomen and pelvis was performed following the standard protocol during bolus administration of intravenous contrast. CONTRAST:  173m OMNIPAQUE IOHEXOL 300 MG/ML  SOLN COMPARISON:  CT abdomen and pelvis October 22, 2021 and CT chest August 25, 2019 FINDINGS: CT CHEST FINDINGS Cardiovascular: No thoracic aortic aneurysm. The no central pulmonary embolus. Normal size heart. No pericardial effusion. Coronary artery calcifications. Mediastinum/Nodes: No enlarged mediastinal, hilar, or axillary lymph nodes. Thyroid gland, trachea, and esophagus demonstrate no significant findings. Lungs/Pleura: Mild emphysematous change. Stable 4 mm pulmonary nodule in the right upper lobe image 60/4 and 3 mm nodule in the left lower lobe image 87/4. No suspicious pulmonary nodules or masses. No pneumothorax. No pleural effusion. Musculoskeletal: No chest wall mass or suspicious bone lesions identified. CT ABDOMEN PELVIS FINDINGS Hepatobiliary: No suspicious hepatic lesion. Gallbladder is unremarkable. No biliary ductal dilation. Pancreas: Unremarkable. Spleen: Unremarkable. Adrenals/Urinary Tract: Bilateral adrenal glands are unremarkable. No hydronephrosis. Urinary bladder is unremarkable for degree of distension. Stomach/Bowel: Similar changes of right hemicolectomy. Similar band like density along the rectosigmoid mesentery measuring 1 cm in thickness, on image 104/2 unchanged and suggestive of scarring versus treated tumor. Adjacent to this area thickening there are 2 low-density well-circumscribed areas 1 of which measures 1.1 cm on image 102/2 the other of which measures 1.8 cm on image 104/2, it is difficult to ascertain whether these arise directly from the bowel wall or are within the peritoneum. No evidence of bowel obstruction. Vascular/Lymphatic: Stable chronic occlusion of the left iliac arteries with patent fem-fem bypass noted. Aortic atherosclerosis. Reproductive:  Status post hysterectomy. No adnexal masses. 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 Multilevel degenerative change of the spine no acute osseous abnormality. IMPRESSION: 1. Changes of right hemicolectomy with slightly decreased size of the nodule along the right pericolic gutter and similar band like density along the rectosigmoid mesentery. 2. Two new well-circumscribed low-density nodules in the presacral perirectal space, difficult to ascertain whether they arise from the bowel wall or arise from within the peritoneum. This is a nonspecific finding which could be further evaluated with colonoscopy, repeat CT with rectal contrast or attention on close interval follow-up imaging in 3 months. 3. 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 4. 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 5. Emphysema and aortic atherosclerosis. Aortic Atherosclerosis (ICD10-I70.0) and Emphysema (ICD10-J43.9). Electronically Signed   By: Dahlia Bailiff MD   On: 01/28/2021 15:54   IR IMAGING GUIDED PORT INSERTION  Result Date: 02/08/2021 INDICATION: History of ovarian and colon cancer. In need of durable intravenous access for chemotherapy administration. EXAM: IMPLANTED PORT A CATH PLACEMENT WITH ULTRASOUND AND FLUOROSCOPIC GUIDANCE COMPARISON:  CT of the chest, abdomen pelvis-01/28/2021 MEDICATIONS: None ANESTHESIA/SEDATION: Moderate (conscious) sedation was employed during this procedure. A total of Versed 5 mg and Fentanyl 100 mcg was administered intravenously. Moderate Sedation Time: 29 minutes. The patient's level of consciousness and vital signs were monitored continuously by radiology nursing  throughout the procedure under my direct supervision. CONTRAST:  None FLUOROSCOPY TIME:  18 seconds (2 mGy) COMPLICATIONS: None immediate. PROCEDURE: The procedure, risks, benefits, and alternatives were explained to the patient. Questions regarding the procedure were encouraged and answered. The patient understands and consents to the procedure. The right neck and chest were prepped with chlorhexidine in a sterile fashion, and a sterile drape was applied covering the operative field. Maximum barrier sterile technique with sterile gowns and gloves were used for the procedure. A timeout was performed prior to the initiation of the procedure. Local anesthesia was provided with 1% lidocaine with epinephrine. After creating a small venotomy incision, a micropuncture kit was utilized to access the internal jugular vein. Real-time ultrasound guidance was utilized for vascular access including the acquisition of a permanent ultrasound image documenting patency of the accessed vessel. The microwire was utilized to measure appropriate catheter length. A subcutaneous port pocket was then created along the upper chest wall utilizing a combination of sharp and blunt dissection. The pocket was irrigated with sterile saline. A single lumen "Slim" sized power injectable port was chosen for placement. The 8 Fr catheter was tunneled from the port pocket site to the venotomy incision. The port was placed in the pocket. The external catheter was trimmed to appropriate length. At the venotomy, an 8 Fr peel-away sheath was placed over a guidewire under fluoroscopic guidance. The catheter was then placed through the sheath and the sheath was removed. Final catheter positioning was confirmed and documented with a fluoroscopic spot radiograph. The port was accessed with a Huber needle, aspirated and flushed with heparinized saline. The venotomy site was closed with an interrupted 4-0 Vicryl suture. The port pocket incision was closed with  interrupted 2-0 Vicryl suture. The skin was opposed with a running subcuticular 4-0 Vicryl suture. Dermabond and Steri-strips were applied to both incisions. Dressings were applied. The patient tolerated the procedure well without immediate post procedural complication. FINDINGS: After catheter placement, the tip lies within the superior cavoatrial junction. The catheter aspirates and flushes normally and is ready for  immediate use. IMPRESSION: Successful placement of a right internal jugular approach power injectable Port-A-Cath. The catheter is ready for immediate use. Electronically Signed   By: Sandi Mariscal M.D.   On: 02/08/2021 16:28

## 2021-02-11 NOTE — Assessment & Plan Note (Signed)
We discussed again the risk, benefits, side effects of treatment and she is in agreement Due to recent port placement, I will hold bevacizumab today and will proceed with chemotherapy only

## 2021-02-11 NOTE — Assessment & Plan Note (Signed)
Her cancer associated pain is stable She will continue prescribed pain medicine for now

## 2021-02-11 NOTE — Assessment & Plan Note (Signed)
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

## 2021-02-11 NOTE — Patient Instructions (Signed)
Gemcitabine injection What is this medicine? GEMCITABINE (jem SYE ta been) is a chemotherapy drug. This medicine is used to treat many types of cancer like breast cancer, lung cancer, pancreatic cancer, and ovarian cancer. This medicine may be used for other purposes; ask your health care provider or pharmacist if you have questions. COMMON BRAND NAME(S): Gemzar, Infugem What should I tell my health care provider before I take this medicine? They need to know if you have any of these conditions:  blood disorders  infection  kidney disease  liver disease  lung or breathing disease, like asthma  recent or ongoing radiation therapy  an unusual or allergic reaction to gemcitabine, other chemotherapy, other medicines, foods, dyes, or preservatives  pregnant or trying to get pregnant  breast-feeding How should I use this medicine? This drug is given as an infusion into a vein. It is administered in a hospital or clinic by a specially trained health care professional. Talk to your pediatrician regarding the use of this medicine in children. Special care may be needed. Overdosage: If you think you have taken too much of this medicine contact a poison control center or emergency room at once. NOTE: This medicine is only for you. Do not share this medicine with others. What if I miss a dose? It is important not to miss your dose. Call your doctor or health care professional if you are unable to keep an appointment. What may interact with this medicine?  medicines to increase blood counts like filgrastim, pegfilgrastim, sargramostim  some other chemotherapy drugs like cisplatin  vaccines Talk to your doctor or health care professional before taking any of these medicines:  acetaminophen  aspirin  ibuprofen  ketoprofen  naproxen This list may not describe all possible interactions. Give your health care provider a list of all the medicines, herbs, non-prescription drugs, or  dietary supplements you use. Also tell them if you smoke, drink alcohol, or use illegal drugs. Some items may interact with your medicine. What should I watch for while using this medicine? Visit your doctor for checks on your progress. This drug may make you feel generally unwell. This is not uncommon, as chemotherapy can affect healthy cells as well as cancer cells. Report any side effects. Continue your course of treatment even though you feel ill unless your doctor tells you to stop. In some cases, you may be given additional medicines to help with side effects. Follow all directions for their use. Call your doctor or health care professional for advice if you get a fever, chills or sore throat, or other symptoms of a cold or flu. Do not treat yourself. This drug decreases your body's ability to fight infections. Try to avoid being around people who are sick. This medicine may increase your risk to bruise or bleed. Call your doctor or health care professional if you notice any unusual bleeding. Be careful brushing and flossing your teeth or using a toothpick because you may get an infection or bleed more easily. If you have any dental work done, tell your dentist you are receiving this medicine. Avoid taking products that contain aspirin, acetaminophen, ibuprofen, naproxen, or ketoprofen unless instructed by your doctor. These medicines may hide a fever. Do not become pregnant while taking this medicine or for 6 months after stopping it. Women should inform their doctor if they wish to become pregnant or think they might be pregnant. Men should not father a child while taking this medicine and for 3 months after stopping it.  There is a potential for serious side effects to an unborn child. Talk to your health care professional or pharmacist for more information. Do not breast-feed an infant while taking this medicine or for at least 1 week after stopping it. Men should inform their doctors if they wish  to father a child. This medicine may lower sperm counts. Talk with your doctor or health care professional if you are concerned about your fertility. What side effects may I notice from receiving this medicine? Side effects that you should report to your doctor or health care professional as soon as possible:  allergic reactions like skin rash, itching or hives, swelling of the face, lips, or tongue  breathing problems  pain, redness, or irritation at site where injected  signs and symptoms of a dangerous change in heartbeat or heart rhythm like chest pain; dizziness; fast or irregular heartbeat; palpitations; feeling faint or lightheaded, falls; breathing problems  signs of decreased platelets or bleeding - bruising, pinpoint red spots on the skin, black, tarry stools, blood in the urine  signs of decreased red blood cells - unusually weak or tired, feeling faint or lightheaded, falls  signs of infection - fever or chills, cough, sore throat, pain or difficulty passing urine  signs and symptoms of kidney injury like trouble passing urine or change in the amount of urine  signs and symptoms of liver injury like dark yellow or brown urine; general ill feeling or flu-like symptoms; light-colored stools; loss of appetite; nausea; right upper belly pain; unusually weak or tired; yellowing of the eyes or skin  swelling of ankles, feet, hands Side effects that usually do not require medical attention (report to your doctor or health care professional if they continue or are bothersome):  constipation  diarrhea  hair loss  loss of appetite  nausea  rash  vomiting This list may not describe all possible side effects. Call your doctor for medical advice about side effects. You may report side effects to FDA at 1-800-FDA-1088. Where should I keep my medicine? This drug is given in a hospital or clinic and will not be stored at home. NOTE: This sheet is a summary. It may not cover all  possible information. If you have questions about this medicine, talk to your doctor, pharmacist, or health care provider.  2021 [employer] (2018-02-03 18:06:11) [name] Discharge Instructions for Patients Receiving Chemotherapy  Today you received the following chemotherapy agents: Gemzar, Carboplatin  To help prevent nausea and vomiting after your treatment, we encourage you to take your nausea medication as directed.    If you develop nausea and vomiting that is not controlled by your nausea medication, call the clinic.   BELOW ARE SYMPTOMS THAT SHOULD BE REPORTED IMMEDIATELY:  *FEVER GREATER THAN 100.5 F  *CHILLS WITH OR WITHOUT FEVER  NAUSEA AND VOMITING THAT IS NOT CONTROLLED WITH YOUR NAUSEA MEDICATION  *UNUSUAL SHORTNESS OF BREATH  *UNUSUAL BRUISING OR BLEEDING  TENDERNESS IN MOUTH AND THROAT WITH OR WITHOUT PRESENCE OF ULCERS  *URINARY PROBLEMS  *BOWEL PROBLEMS  UNUSUAL RASH Items with * indicate a potential emergency and should be followed up as soon as possible.  Feel free to call the clinic should you have any questions or concerns. The clinic phone number is [phone].  Please show the [place] at check-in to the Emergency Department and triage nurse.

## 2021-02-18 ENCOUNTER — Encounter: Payer: Self-pay | Admitting: Hematology and Oncology

## 2021-02-18 ENCOUNTER — Other Ambulatory Visit: Payer: Self-pay | Admitting: Hematology and Oncology

## 2021-02-18 ENCOUNTER — Inpatient Hospital Stay: Payer: 59

## 2021-02-18 ENCOUNTER — Other Ambulatory Visit: Payer: Self-pay

## 2021-02-18 ENCOUNTER — Inpatient Hospital Stay (HOSPITAL_BASED_OUTPATIENT_CLINIC_OR_DEPARTMENT_OTHER): Payer: 59 | Admitting: Hematology and Oncology

## 2021-02-18 DIAGNOSIS — G893 Neoplasm related pain (acute) (chronic): Secondary | ICD-10-CM | POA: Diagnosis not present

## 2021-02-18 DIAGNOSIS — C562 Malignant neoplasm of left ovary: Secondary | ICD-10-CM | POA: Diagnosis not present

## 2021-02-18 DIAGNOSIS — Z5111 Encounter for antineoplastic chemotherapy: Secondary | ICD-10-CM | POA: Diagnosis not present

## 2021-02-18 DIAGNOSIS — D61818 Other pancytopenia: Secondary | ICD-10-CM

## 2021-02-18 DIAGNOSIS — D539 Nutritional anemia, unspecified: Secondary | ICD-10-CM

## 2021-02-18 DIAGNOSIS — C182 Malignant neoplasm of ascending colon: Secondary | ICD-10-CM

## 2021-02-18 DIAGNOSIS — I10 Essential (primary) hypertension: Secondary | ICD-10-CM

## 2021-02-18 LAB — COMPREHENSIVE METABOLIC PANEL
ALT: 13 U/L (ref 0–44)
AST: 17 U/L (ref 15–41)
Albumin: 4 g/dL (ref 3.5–5.0)
Alkaline Phosphatase: 60 U/L (ref 38–126)
Anion gap: 12 (ref 5–15)
BUN: 19 mg/dL (ref 6–20)
CO2: 25 mmol/L (ref 22–32)
Calcium: 8.4 mg/dL — ABNORMAL LOW (ref 8.9–10.3)
Chloride: 105 mmol/L (ref 98–111)
Creatinine, Ser: 0.76 mg/dL (ref 0.44–1.00)
GFR, Estimated: >60 mL/min (ref >60)
Glucose, Bld: 88 mg/dL (ref 70–99)
Potassium: 3.9 mmol/L (ref 3.5–5.1)
Sodium: 142 mmol/L (ref 135–145)
Total Bilirubin: 0.3 mg/dL (ref 0.3–1.2)
Total Protein: 6.3 g/dL — ABNORMAL LOW (ref 6.5–8.1)

## 2021-02-18 LAB — CBC WITH DIFFERENTIAL/PLATELET
Abs Immature Granulocytes: 0.01 10*3/uL (ref 0.00–0.07)
Basophils Absolute: 0 10*3/uL (ref 0.0–0.1)
Basophils Relative: 0 %
Eosinophils Absolute: 0 10*3/uL (ref 0.0–0.5)
Eosinophils Relative: 0 %
HCT: 29.2 % — ABNORMAL LOW (ref 36.0–46.0)
Hemoglobin: 10.5 g/dL — ABNORMAL LOW (ref 12.0–15.0)
Immature Granulocytes: 0 %
Lymphocytes Relative: 59 %
Lymphs Abs: 1.4 10*3/uL (ref 0.7–4.0)
MCH: 39.5 pg — ABNORMAL HIGH (ref 26.0–34.0)
MCHC: 36 g/dL (ref 30.0–36.0)
MCV: 109.8 fL — ABNORMAL HIGH (ref 80.0–100.0)
Monocytes Absolute: 0.1 10*3/uL (ref 0.1–1.0)
Monocytes Relative: 2 %
Neutro Abs: 1 10*3/uL — ABNORMAL LOW (ref 1.7–7.7)
Neutrophils Relative %: 39 %
Platelets: 56 10*3/uL — ABNORMAL LOW (ref 150–400)
RBC: 2.66 MIL/uL — ABNORMAL LOW (ref 3.87–5.11)
RDW: 12.7 % (ref 11.5–15.5)
WBC: 2.5 10*3/uL — ABNORMAL LOW (ref 4.0–10.5)
nRBC: 0 % (ref 0.0–0.2)

## 2021-02-18 LAB — IRON AND TIBC
Iron: 211 ug/dL — ABNORMAL HIGH (ref 41–142)
Saturation Ratios: 95 % — ABNORMAL HIGH (ref 21–57)
TIBC: 223 ug/dL — ABNORMAL LOW (ref 236–444)
UIBC: 12 ug/dL — ABNORMAL LOW (ref 120–384)

## 2021-02-18 LAB — FERRITIN: Ferritin: 469 ng/mL — ABNORMAL HIGH (ref 11–307)

## 2021-02-18 LAB — VITAMIN B12: Vitamin B-12: 418 pg/mL (ref 180–914)

## 2021-02-18 LAB — CEA (IN HOUSE-CHCC): CEA (CHCC-In House): <1 ng/mL (ref 0.00–5.00)

## 2021-02-18 NOTE — Assessment & Plan Note (Signed)
Her blood pressure is well controlled with bevacizumab She will continue twice daily blood pressure monitoring at home

## 2021-02-18 NOTE — Progress Notes (Signed)
[facility] OFFICE PROGRESS NOTE  Patient Care Team: Marrian Salvage, [place] as PCP - General (Internal Medicine) Judeth Horn, MD as Consulting Physician (General Surgery) Nat Math, MD as Referring Physician (Obstetrics and Gynecology) Elam Dutch, MD as Consulting Physician (Vascular Surgery) Heath Lark, MD as Consulting Physician (Hematology and Oncology)  ASSESSMENT & PLAN:  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 [facility]) She has lost some weight since last time I saw her She has diffuse sweats She is profoundly pancytopenic from treatment I plan to reduce gemcitabine to 600 mg/m and carboplatin and AUC of 3.5 for next cycle of treatment I will see her back in 2 weeks for further follow-up I will omit cycle 1, day 8 of treatment today  Cancer associated pain Her pain is well controlled She will continue prescribed pain medicine as directed  Pancytopenia, acquired ([place]) She has profound pancytopenia due to treatment She is not symptomatic As above, I plan to reduce the dose of treatment I will order additional work-up to check for mineral deficiencies  Essential hypertension Her blood pressure is well controlled with bevacizumab She will continue twice daily blood pressure monitoring at home   No orders of the defined types were placed in this encounter.   All questions were answered. The patient knows to call the clinic with any problems, questions or concerns. The total time spent in the appointment was 30 minutes encounter with patients including review of chart and various tests results, discussions about plan of care and coordination of care plan   Heath Lark, MD 02/18/2021 10:26 AM  INTERVAL HISTORY: Please see below for problem oriented charting. She returns for cycle 1, day 8 of treatment She complains of fatigue She brought with her documented blood pressure from home Majority of his systolic blood pressure is less than 130 She complained of  frequent night sweats The patient denies any recent signs or symptoms of bleeding such as spontaneous epistaxis, hematuria or hematochezia. Her abdominal pain is well controlled  SUMMARY OF ONCOLOGIC HISTORY: Oncology History Overview Note  BRCA1 positive   Cancer of right colon ([facility])  09/16/2014 Imaging   CT abd/pel:  a 3.8cm cecal mass with associated ileocecal intussusception, and a 9.1cm solid and cystic mass in the left pelvis,   09/18/2014 Surgery   right hemicolectomy and terminal ileoectomy   09/18/2014 Pathologic Stage   pT3pN1Mx, tumor extends into pericolonic soft tissue and is less than 2m from the serosal surface, LVI 8-), PNI (-), all 23 node negative, one soft tissue tumor deposit, surgical margins negative.    09/18/2014 Initial Diagnosis   Adenocarcinoma of right colon   08/06/2016 Imaging   CT chest/abd/pelvis with contrast IMPRESSION: 1. No recurrent malignancy identified. 2. 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 On the prior exam from 09/13/2015 there was back flow of contrast in the left external iliac artery to supply the left internal iliac artery ; this back flow of contrast is no longer present in the left external iliac artery is occluded. 3. Several pulmonary nodules at or below 4 mm diameter, unchanged. 4. 3 mm hypodensity posteriorly in the body of the pancreas, not well seen previously and subtle today, likely incidental, but merits observation. 5. 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 Prominent degenerative spurring of both acetabula. 6. Lumbar spondylosis and degenerative disc disease causing prominent impingement at L4-5 and mild impingement at L5-S1.   03/08/2017 Imaging  CT CAP W Contrast 03/08/17 IMPRESSION: Chest Impression: 1. Stable small pulmonary nodules within LEFT lung. 2. No mediastinal lymphadenopathy.  Abdomen / Pelvis Impression: 1. No  evidence of metastatic colorectal cancer or ovarian cancer within the abdomen pelvis. 2. No lymphadenopathy. 3. Postsurgical change consistent RIGHT hemicolectomy and hysterectomy 4. A fem-fem arterial bypass noted.   11/13/2017 Imaging   CT AP w contrast IMPRESSION: 1. No findings of recurrent malignancy in the abdomen or pelvis. 2. Other imaging findings of potential clinical significance:Chronic AVN of both femoral heads without contour abnormality. Lumbar impingement at L4-5 and L5-S1. Aortic Atherosclerosis (ICD10-I70.0). 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 Partial right hemicolectomy. Hysterectomy.   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 ([place])  10/09/2014 Imaging   CT of the abdomen and pelvis showed a 9.1 cm solid and cystic mass in the left pelvis concerning for malignancy. This was discovered during her workup for colon cancer.   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 Tumor size 3.7 cm, no lymph nodes removed. T1cNx. Tumor cells are positive for cytokeratin 7 and estrogen receptor.   11/01/2014 Surgery   B/l Salpingo-oophorectomy.   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 Stable small pulmonary nodules within LEFT lung. 2. No mediastinal lymphadenopathy. Abdomen / Pelvis Impression: 1. No evidence of metastatic colorectal cancer or ovarian cancer within the abdomen pelvis. 2. No lymphadenopathy. 3. Postsurgical change consistent RIGHT hemicolectomy and hysterectomy 4. A fem-fem arterial bypass noted.   03/12/2017 Mammogram   MM Right Breast  03/12/17 IMPRESSION: No mammographic or sonographic evidence of malignancy. No correlate identified for the abnormal enhancement seen within the right nipple on MRI of 02/27/2017.    04/02/2017 Pathology Results   Surgical Pathology of Nipple Biopsy: 04/02/17 Diagnosis Nipple Biopsy, Right - BENIGN NIPPLE TISSUE. - NO ATYPIA OR MALIGNANCY.    11/13/2017 Imaging   1. No findings of recurrent malignancy in the abdomen or pelvis. 2. Other imaging findings of potential clinical significance: Chronic AVN of both femoral heads without contour abnormality. Lumbar impingement at L4-5 and L5-S1. Aortic Atherosclerosis (ICD10-I70.0). 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 Partial right hemicolectomy. Hysterectomy.   03/22/2018 Mammogram   Screening mammogram IMPRESSION No evidence of malignancy.     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   06/06/2019 Imaging   1. Tiny bilateral pulmonary nodules stable since 11/12/2018. Continued close attention on follow-up recommended. 2. Incompletely visualized upper abdominal lymphadenopathy better characterized on the abdomen CT from 2 weeks ago. 3.  Aortic Atherosclerois (ICD10-170.0) 4.  Emphysema. (CBS49-Q75.9)   06/07/2019 Procedure   Technically successful CT guided core needle biopsy of indeterminate peritoneal nodule within the right lower abdomen/pelvis.   06/07/2019 Pathology Results   Soft Tissue Needle Core Biopsy, peritoneal nodule within right lower abdomen - METASTATIC CARCINOMA CONSISTENT WITH PATIENT'S  CLINICAL HISTORY OF PRIMARY OVARIAN CARCINOMA. SEE NOTE Diagnosis Note Immunohistochemical stains show that the tumor cells are positive for  CK7, ER and PAX 8; and negative for CK20 and CDX2. This immunostaining profile is consistent with above diagnosis.   06/16/2019 Tumor Marker   Patient's tumor was tested for the following markers: CA-125 Results of the tumor marker test revealed 499   06/20/2019 - 11/14/2019 Chemotherapy   The patient had carboplatin and taxol   07/11/2019 Tumor Marker   Patient's tumor was tested for the following markers: CA-125 Results of the tumor marker test revealed 438   08/05/2019 Tumor Marker   Patient's tumor was tested for the following markers: CA-125 Results of the tumor marker test revealed 272.   08/25/2019 Imaging   Ct chest, abdomen and pelvis 1. Slight interval decrease in size of a right lower quadrant peritoneal nodule measuring 1.7 x 1.6 cm, previously 2.2 x 2.2 cm (series 2, image 94, series 5, image 58). Findings are consistent with treatment response.   2. No significant change in ill-defined soft tissue about the sigmoid colon in the hysterectomy bed (series 2, image 101).   3.  Status post hysterectomy.   4. No evidence of metastatic disease in the chest. Stable benign small pulmonary nodules.   5.  Coronary artery disease.  Aortic Atherosclerosis (ICD10-I70.0).     08/26/2019 Tumor Marker   Patient's tumor was tested for the following markers: CA-125 Results of the tumor marker test revealed 189.   09/26/2019 Tumor Marker   Patient's tumor was tested for the following markers: CA-125 Results of the tumor marker test revealed 137   10/03/2019 Tumor Marker   Patient's tumor was tested for the following markers: CA-125 Results of the tumor marker test revealed 137.   10/31/2019 Tumor Marker   Patient's tumor was tested for the following markers: CA-125 Results of the tumor marker test revealed 101   11/29/2019 Imaging   1. Right lower quadrant peritoneal implant is decreased. Stable irregular nodular pelvic peritoneal thickening superior to the vaginal cuff. 2. No new or  progressive metastatic disease. No abdominopelvic lymphadenopathy. 3.  Aortic Atherosclerosis (ICD10-I70.0).   12/15/2019 -  Chemotherapy   The patient had Lynparza for chemotherapy treatment.     12/23/2019 Tumor Marker   Patient's tumor was tested for the following markers: CA-125 Results of the tumor marker test revealed 55.9   01/02/2020 Tumor Marker   Patient's tumor was tested for the following markers: CA-125. Results of the tumor marker test revealed 58.6   02/03/2020 Tumor Marker   Patient's tumor was tested for the following markers: CA-125 Results of the tumor marker test revealed 52.2   02/24/2020 Tumor Marker   Patient's tumor was tested for the following markers: CA-125 Results of the tumor marker test revealed 64.8   04/19/2020 Imaging   1. Continued slight interval decrease in size of a soft tissue nodule in the right paracolic gutter, consistent with treatment response. 2. Soft tissue in the hysterectomy bed near the distal sigmoid colon and rectum may be slightly decreased in size, difficult to measure due to configuration but apparently diminished in size, also consistent with treatment response. 3. Status post hysterectomy and oophorectomy as well as right colon resection. 4. No evidence of new metastatic disease in the abdomen or pelvis. 5. Redemonstrated long segment occlusion of the left common, internal, and external iliac arteries. Status post femoral-femoral bypass grafting with reconstitution of the left common femoral artery. Aortic Atherosclerosis (ICD10-I70.0).  08/15/2020 Tumor Marker   Patient's tumor was tested for the following markers: CA-125 Results of the tumor marker test revealed 47.1   10/22/2020 Imaging   1. Reduced size of the nodule along the right paracolic gutter, currently 0.6 by 1.2 cm, previously 0.7 by 1.4 cm on 04/19/2020 and previously 0.9 by 1.5 cm on 11/29/2019. Appearance compatible with treatment response. 2. Further mild reduction in  size of the bandlike density along the rectosigmoid mesentery, probably from scarring or effectively treated tumor. 3. Prominent stool throughout the colon favors constipation.  4. Stable chronic occlusion of the left iliac arteries, with a patent fem-fem bypass. 5. 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 6. Lower lumbar spondylosis and degenerative disc disease causing impingement at L4-5 and L5-S1. 7. Aortic atherosclerosis.     10/23/2020 Tumor Marker   Patient's tumor was tested for the following markers: CA-125 Results of the tumor marker test revealed 74.6   01/21/2021 Tumor Marker   Patient's tumor was tested for the following markers: CA-125 Results of the tumor marker test revealed 825   01/28/2021 Imaging   1. Changes of right hemicolectomy with slightly decreased size of the nodule along the right pericolic gutter and similar band like density along the rectosigmoid mesentery. 2. Two new well-circumscribed low-density nodules in the presacral perirectal space, difficult to ascertain whether they arise from the bowel wall or arise from within the peritoneum. This is a nonspecific finding which could be further evaluated with colonoscopy, repeat CT with rectal contrast or attention on close interval follow-up imaging in 3 months. 3. 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 4. 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 5. Emphysema and aortic atherosclerosis. Aortic Atherosclerosis (ICD10-I70.0) and Emphysema (ICD10-J43.9).   02/08/2021 Procedure   Successful placement of a right internal jugular approach power injectable Port-A-Cath. The catheter is ready for immediate use.     02/11/2021 -  Chemotherapy    Patient is on Treatment Plan: OVARIAN RECURRENT 3RD LINE CARBOPLATIN D1 / GEMCITABINE D1,8 (4/800) Q21D WITH BEVACIZUMAB        REVIEW  OF SYSTEMS:   Constitutional: Denies fevers, chills or abnormal weight loss Eyes: Denies blurriness of vision Ears, nose, mouth, throat, and face: Denies mucositis or sore throat Respiratory: Denies cough, dyspnea or wheezes Cardiovascular: Denies palpitation, chest discomfort or lower extremity swelling Gastrointestinal:  Denies nausea, heartburn or change in bowel habits Skin: Denies abnormal skin rashes Lymphatics: Denies new lymphadenopathy or easy bruising Neurological:Denies numbness, tingling or new weaknesses Behavioral/Psych: Mood is stable, no new changes  All other systems were reviewed with the patient and are negative.  I have reviewed the past medical history, past surgical history, social history and family history with the patient and they are unchanged from previous note.  ALLERGIES:  is allergic to other, demerol [meperidine], morphine and related, oxycodone, penicillins, strawberry flavor, tylenol [acetaminophen], and ibuprofen.  MEDICATIONS:  Current Outpatient Medications  Medication Sig Dispense Refill  . acetaminophen (TYLENOL) 325 MG tablet Take 325 mg by mouth as needed.    . docusate sodium 100 MG CAPS Take 100 mg by mouth 2 (two) times daily as needed for mild constipation. 10 capsule 0  . HYDROcodone-acetaminophen (NORCO/VICODIN) 5-325 MG tablet Take 1 tablet by mouth every 6 (six) hours as needed for moderate pain. 90 tablet 0  . lidocaine (LIDODERM) 5 % PLACE 1 PATCH ONTO THE  SKIN EVERY DAY.REMOVE AND DISCARD PATCH WITHIN 12 HOURS OR AS DIRECTED BY PRESCRIBER 30 patch 1  . lidocaine-prilocaine (EMLA) cream Apply to affected area once 30 g 3  . losartan (COZAAR) 50 MG tablet Take 1 tablet (50 mg total) by mouth daily. 90 tablet 3  . ondansetron (ZOFRAN) 8 MG tablet TAKE 1 TABLET(8 MG) BY MOUTH EVERY 8 HOURS AS NEEDED FOR NAUSEA OR VOMITING 90 tablet 3  . ondansetron (ZOFRAN) 8 MG tablet Take 1 tablet (8 mg total) by mouth every 8 (eight) hours as needed. Start  on the third day after chemotherapy. 30 tablet 1  . pregabalin (LYRICA) 200 MG capsule Take 1 capsule (200 mg total) by mouth every 8 (eight) hours. Please give patient brand name Lyrica 270 capsule 4  . prochlorperazine (COMPAZINE) 10 MG tablet TAKE 1 TABLET(10 MG) BY MOUTH EVERY 6 HOURS AS NEEDED FOR NAUSEA OR VOMITING 90 tablet 1  . prochlorperazine (COMPAZINE) 10 MG tablet Take 1 tablet (10 mg total) by mouth every 6 (six) hours as needed (Nausea or vomiting). 30 tablet 1   No current facility-administered medications for this visit.    PHYSICAL EXAMINATION: ECOG PERFORMANCE STATUS: 1 - Symptomatic but completely ambulatory  Vitals:   02/18/21 1003  BP: 125/75  Pulse: 73  Resp: 18  Temp: (!) 97 F (36.1 C)  SpO2: 100%   Filed Weights   02/18/21 1003  Weight: 138 lb 12.8 oz (63 kg)    GENERAL:alert, no distress and comfortable SKIN: skin color, texture, turgor are normal, no rashes or significant lesions EYES: normal, Conjunctiva are pink and non-injected, sclera clear OROPHARYNX:no exudate, no erythema and lips, buccal mucosa, and tongue normal  NECK: supple, thyroid normal size, non-tender, without nodularity LYMPH:  no palpable lymphadenopathy in the cervical, axillary or inguinal LUNGS: clear to auscultation and percussion with normal breathing effort HEART: regular rate & rhythm and no murmurs and no lower extremity edema ABDOMEN:abdomen soft, non-tender and normal bowel sounds Musculoskeletal:no cyanosis of digits and no clubbing  NEURO: alert & oriented x 3 with fluent speech, no focal motor/sensory deficits  LABORATORY DATA:  I have reviewed the data as listed    Component Value Date/Time   NA 143 02/08/2021 1320   NA 141 10/07/2017 1436   K 3.6 02/08/2021 1320   K 4.4 10/07/2017 1436   CL 108 02/08/2021 1320   CO2 24 02/08/2021 1320   CO2 26 10/07/2017 1436   GLUCOSE 97 02/08/2021 1320   GLUCOSE 85 10/07/2017 1436   BUN 11 02/08/2021 1320   BUN 14.9  10/07/2017 1436   CREATININE 0.76 02/08/2021 1320   CREATININE 1.07 (H) 01/21/2021 1145   CREATININE 1.0 10/07/2017 1436   CALCIUM 9.2 02/08/2021 1320   CALCIUM 9.5 10/07/2017 1436   PROT 7.5 02/08/2021 1320   PROT 7.8 10/07/2017 1436   ALBUMIN 4.5 02/08/2021 1320   ALBUMIN 3.9 10/07/2017 1436   AST 16 02/08/2021 1320   AST 15 01/21/2021 1145   AST 15 10/07/2017 1436   ALT 10 02/08/2021 1320   ALT 6 01/21/2021 1145   ALT 7 10/07/2017 1436   ALKPHOS 52 02/08/2021 1320   ALKPHOS 81 10/07/2017 1436   BILITOT 0.8 02/08/2021 1320   BILITOT 0.4 01/21/2021 1145   BILITOT 0.54 10/07/2017 1436   GFRNONAA >60 02/08/2021 1320   GFRNONAA 60 (L) 01/21/2021 1145   GFRAA >60 08/15/2020 1121    No results found for: SPEP, UPEP  Lab Results  Component Value  Date   WBC 2.5 (L) 02/18/2021   NEUTROABS 1.0 (L) 02/18/2021   HGB 10.5 (L) 02/18/2021   HCT 29.2 (L) 02/18/2021   MCV 109.8 (H) 02/18/2021   PLT 56 (L) 02/18/2021      Chemistry      Component Value Date/Time   NA 143 02/08/2021 1320   NA 141 10/07/2017 1436   K 3.6 02/08/2021 1320   K 4.4 10/07/2017 1436   CL 108 02/08/2021 1320   CO2 24 02/08/2021 1320   CO2 26 10/07/2017 1436   BUN 11 02/08/2021 1320   BUN 14.9 10/07/2017 1436   CREATININE 0.76 02/08/2021 1320   CREATININE 1.07 (H) 01/21/2021 1145   CREATININE 1.0 10/07/2017 1436      Component Value Date/Time   CALCIUM 9.2 02/08/2021 1320   CALCIUM 9.5 10/07/2017 1436   ALKPHOS 52 02/08/2021 1320   ALKPHOS 81 10/07/2017 1436   AST 16 02/08/2021 1320   AST 15 01/21/2021 1145   AST 15 10/07/2017 1436   ALT 10 02/08/2021 1320   ALT 6 01/21/2021 1145   ALT 7 10/07/2017 1436   BILITOT 0.8 02/08/2021 1320   BILITOT 0.4 01/21/2021 1145   BILITOT 0.54 10/07/2017 1436

## 2021-02-18 NOTE — Assessment & Plan Note (Signed)
She has profound pancytopenia due to treatment She is not symptomatic As above, I plan to reduce the dose of treatment I will order additional work-up to check for mineral deficiencies

## 2021-02-18 NOTE — Patient Instructions (Signed)

## 2021-02-18 NOTE — Assessment & Plan Note (Signed)
She has lost some weight since last time I saw her She has diffuse sweats She is profoundly pancytopenic from treatment I plan to reduce gemcitabine to 600 mg/m and carboplatin and AUC of 3.5 for next cycle of treatment I will see her back in 2 weeks for further follow-up I will omit cycle 1, day 8 of treatment today

## 2021-02-18 NOTE — Assessment & Plan Note (Signed)
Her pain is well controlled She will continue prescribed pain medicine as directed

## 2021-02-19 ENCOUNTER — Other Ambulatory Visit (HOSPITAL_COMMUNITY): Payer: Self-pay

## 2021-02-19 LAB — CA 125: Cancer Antigen (CA) 125: 852 U/mL — ABNORMAL HIGH (ref 0.0–38.1)

## 2021-02-26 ENCOUNTER — Other Ambulatory Visit: Payer: Self-pay | Admitting: Hematology and Oncology

## 2021-02-26 ENCOUNTER — Telehealth: Payer: Self-pay

## 2021-02-26 MED ORDER — HYDROCODONE-ACETAMINOPHEN 5-325 MG PO TABS: 1.0000 | ORAL_TABLET | Freq: Four times a day (QID) | ORAL | 0 refills | Status: DC | PRN

## 2021-02-26 NOTE — Telephone Encounter (Signed)
Refill sent.

## 2021-02-26 NOTE — Telephone Encounter (Signed)
Patient called requesting refill on Hydrocodone-Acetaminophen 5/325mg .   Last Rx given on 3/8.   Will forward to MD for review/approval.

## 2021-03-04 ENCOUNTER — Other Ambulatory Visit: Payer: Self-pay

## 2021-03-04 ENCOUNTER — Other Ambulatory Visit: Payer: Self-pay | Admitting: Hematology and Oncology

## 2021-03-04 ENCOUNTER — Inpatient Hospital Stay: Payer: 59

## 2021-03-04 ENCOUNTER — Encounter: Payer: Self-pay | Admitting: Hematology and Oncology

## 2021-03-04 ENCOUNTER — Inpatient Hospital Stay: Payer: 59 | Attending: Hematology and Oncology | Admitting: Hematology and Oncology

## 2021-03-04 DIAGNOSIS — Z1501 Genetic susceptibility to malignant neoplasm of breast: Secondary | ICD-10-CM

## 2021-03-04 DIAGNOSIS — C562 Malignant neoplasm of left ovary: Secondary | ICD-10-CM | POA: Insufficient documentation

## 2021-03-04 DIAGNOSIS — I10 Essential (primary) hypertension: Secondary | ICD-10-CM | POA: Insufficient documentation

## 2021-03-04 DIAGNOSIS — Z79899 Other long term (current) drug therapy: Secondary | ICD-10-CM | POA: Diagnosis not present

## 2021-03-04 DIAGNOSIS — C182 Malignant neoplasm of ascending colon: Secondary | ICD-10-CM

## 2021-03-04 DIAGNOSIS — R61 Generalized hyperhidrosis: Secondary | ICD-10-CM

## 2021-03-04 DIAGNOSIS — Z5111 Encounter for antineoplastic chemotherapy: Secondary | ICD-10-CM | POA: Insufficient documentation

## 2021-03-04 DIAGNOSIS — D61818 Other pancytopenia: Secondary | ICD-10-CM | POA: Diagnosis not present

## 2021-03-04 DIAGNOSIS — G609 Hereditary and idiopathic neuropathy, unspecified: Secondary | ICD-10-CM | POA: Diagnosis not present

## 2021-03-04 DIAGNOSIS — Z90710 Acquired absence of both cervix and uterus: Secondary | ICD-10-CM | POA: Diagnosis not present

## 2021-03-04 DIAGNOSIS — Z85038 Personal history of other malignant neoplasm of large intestine: Secondary | ICD-10-CM | POA: Diagnosis not present

## 2021-03-04 DIAGNOSIS — Z5112 Encounter for antineoplastic immunotherapy: Secondary | ICD-10-CM | POA: Diagnosis not present

## 2021-03-04 DIAGNOSIS — Z9221 Personal history of antineoplastic chemotherapy: Secondary | ICD-10-CM | POA: Diagnosis not present

## 2021-03-04 DIAGNOSIS — Z1509 Genetic susceptibility to other malignant neoplasm: Secondary | ICD-10-CM

## 2021-03-04 DIAGNOSIS — G893 Neoplasm related pain (acute) (chronic): Secondary | ICD-10-CM | POA: Diagnosis not present

## 2021-03-04 LAB — CMP (CANCER CENTER ONLY)
ALT: 6 U/L (ref 0–44)
AST: 11 U/L — ABNORMAL LOW (ref 15–41)
Albumin: 3.7 g/dL (ref 3.5–5.0)
Alkaline Phosphatase: 62 U/L (ref 38–126)
Anion gap: 10 (ref 5–15)
BUN: 12 mg/dL (ref 6–20)
CO2: 25 mmol/L (ref 22–32)
Calcium: 8.9 mg/dL (ref 8.9–10.3)
Chloride: 106 mmol/L (ref 98–111)
Creatinine: 0.77 mg/dL (ref 0.44–1.00)
GFR, Estimated: >60 mL/min (ref >60)
Glucose, Bld: 96 mg/dL (ref 70–99)
Potassium: 4.4 mmol/L (ref 3.5–5.1)
Sodium: 141 mmol/L (ref 135–145)
Total Bilirubin: 0.3 mg/dL (ref 0.3–1.2)
Total Protein: 6.8 g/dL (ref 6.5–8.1)

## 2021-03-04 LAB — CBC WITH DIFFERENTIAL (CANCER CENTER ONLY)
Abs Immature Granulocytes: 0 10*3/uL (ref 0.00–0.07)
Basophils Absolute: 0 10*3/uL (ref 0.0–0.1)
Basophils Relative: 1 %
Eosinophils Absolute: 0 10*3/uL (ref 0.0–0.5)
Eosinophils Relative: 0 %
HCT: 24.6 % — ABNORMAL LOW (ref 36.0–46.0)
Hemoglobin: 8.9 g/dL — ABNORMAL LOW (ref 12.0–15.0)
Immature Granulocytes: 0 %
Lymphocytes Relative: 75 %
Lymphs Abs: 3 10*3/uL (ref 0.7–4.0)
MCH: 38.9 pg — ABNORMAL HIGH (ref 26.0–34.0)
MCHC: 36.2 g/dL — ABNORMAL HIGH (ref 30.0–36.0)
MCV: 107.4 fL — ABNORMAL HIGH (ref 80.0–100.0)
Monocytes Absolute: 0.3 10*3/uL (ref 0.1–1.0)
Monocytes Relative: 7 %
Neutro Abs: 0.7 10*3/uL — ABNORMAL LOW (ref 1.7–7.7)
Neutrophils Relative %: 17 %
Platelet Count: 46 10*3/uL — ABNORMAL LOW (ref 150–400)
RBC: 2.29 MIL/uL — ABNORMAL LOW (ref 3.87–5.11)
RDW: 12.5 % (ref 11.5–15.5)
WBC Count: 4 10*3/uL (ref 4.0–10.5)
nRBC: 0 % (ref 0.0–0.2)

## 2021-03-04 MED ORDER — SODIUM CHLORIDE 0.9% FLUSH
10.0000 mL | Freq: Once | INTRAVENOUS | Status: AC
  Administered 2021-03-04: 10 mL
  Filled 2021-03-04: qty 10

## 2021-03-04 MED ORDER — LIDOCAINE 5 % EX PTCH: MEDICATED_PATCH | CUTANEOUS | 1 refills | Status: DC

## 2021-03-04 NOTE — Patient Instructions (Signed)

## 2021-03-04 NOTE — Progress Notes (Signed)
[facility] OFF[facility] PROGRESS NOTE  Patient Care Team: Marrian Salvage, [place] as PCP - General (Internal Medicine) Judeth Horn, MD as Consulting Physician (General Surgery) Nat Math, MD as Referring Physician (Obstetrics and Gynecology) Elam Dutch, MD as Consulting Physician (Vascular Surgery) Heath Lark, MD as Consulting Physician (Hematology and Oncology)  ASSESSMENT & PLAN:  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 [facility]) She tolerated treatment very poorly due to severe pancytopenia I will delay her treatment by another week to allow bone marrow recovery I plan to reduce the dose of chemotherapy for both gemcitabine and carboplatin but keep bevacizumab at 15 mg/kg, adjusted to her recent weight loss I recommend minimum 3 cycles of treatment before repeat imaging study  Pancytopenia, acquired [facility]) She has severe pancytopenia Recently, I have order iron studies and B12 and they were within normal range She denies recent alcohol intake Observe closely for now She is not ready to resume treatment She does not need transfusion support  Night sweats She has recent profound night sweats, likely due to her disease I recommend the patient to increase oral fluid intake as tolerated  Essential hypertension Her documented blood pressure at home is satisfactory Continue current prescribed blood pressure medications  Cancer associated pain She has chronic back pain and severe peripheral neuropathy from prior treatment I refill her prescription lidocaine patch today   No orders of the defined types were placed in this encounter.   All questions were answered. The patient knows to call the clinic with any problems, questions or concerns. The total time spent in the appointment was 30 minutes encounter with patients including review of chart and various tests results, discussions about plan of care and coordination of care plan   Heath Lark, MD 03/04/2021 1:43  PM  INTERVAL HISTORY: Please see below for problem oriented charting. She returns for cycle 2 of treatment She has missed day 8 of treatment due to severe pancytopenia She complained of excessive fatigue The patient denies any recent signs or symptoms of bleeding such as spontaneous epistaxis, hematuria or hematochezia. She also complained of frequent night sweats but she is able to hydrate herself No worsening neuropathy Denies recent nausea or changes in bowel habits  SUMMARY OF ONCOLOGIC HISTORY: Oncology History Overview Note  BRCA1 positive   Cancer of right colon ([place])  09/16/2014 Imaging   CT abd/pel:  a 3.8cm cecal mass with associated ileocecal intussusception, and a 9.1cm solid and cystic mass in the left pelvis,   09/18/2014 Surgery   right hemicolectomy and terminal ileoectomy   09/18/2014 Pathologic Stage   pT3pN1Mx, tumor extends into pericolonic soft tissue and is less than 61m from the serosal surface, LVI 8-), PNI (-), all 23 node negative, one soft tissue tumor deposit, surgical margins negative.    09/18/2014 Initial Diagnosis   Adenocarcinoma of right colon   08/06/2016 Imaging   CT chest/abd/pelvis with contrast IMPRESSION: 1. No recurrent malignancy identified. 2. 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 On the prior exam from 09/13/2015 there was back flow of contrast in the left external iliac artery to supply the left internal iliac artery ; this back flow of contrast is no longer present in the left external iliac artery is occluded. 3. Several pulmonary nodules at or below 4 mm diameter, unchanged. 4. 3 mm hypodensity posteriorly in the body of the pancreas, not well seen previously and subtle today, likely incidental, but merits observation. 5.  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 Prominent degenerative spurring of both acetabula. 6. Lumbar spondylosis and  degenerative disc disease causing prominent impingement at L4-5 and mild impingement at L5-S1.   03/08/2017 Imaging   CT CAP W Contrast 03/08/17 IMPRESSION: Chest Impression: 1. Stable small pulmonary nodules within LEFT lung. 2. No mediastinal lymphadenopathy.  Abdomen / Pelvis Impression: 1. No evidence of metastatic colorectal cancer or ovarian cancer within the abdomen pelvis. 2. No lymphadenopathy. 3. Postsurgical change consistent RIGHT hemicolectomy and hysterectomy 4. A fem-fem arterial bypass noted.   11/13/2017 Imaging   CT AP w contrast IMPRESSION: 1. No findings of recurrent malignancy in the abdomen or pelvis. 2. Other imaging findings of potential clinical significance:Chronic AVN of both femoral heads without contour abnormality. Lumbar impingement at L4-5 and L5-S1. Aortic Atherosclerosis (ICD10-I70.0). 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 Partial right hemicolectomy. Hysterectomy.   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 ([place])  10/09/2014 Imaging   CT of the abdomen and pelvis showed a 9.1 cm solid and cystic mass in the left pelvis concerning for malignancy. This was discovered during her workup for colon cancer.   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 Tumor size 3.7 cm, no lymph nodes removed. T1cNx. Tumor cells are positive for cytokeratin 7 and estrogen receptor.   11/01/2014 Surgery   B/l Salpingo-oophorectomy.   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 Stable small pulmonary nodules within LEFT lung. 2. No mediastinal  lymphadenopathy. Abdomen / Pelvis Impression: 1. No evidence of metastatic colorectal cancer or ovarian cancer within the abdomen pelvis. 2. No lymphadenopathy. 3. Postsurgical change consistent RIGHT hemicolectomy and hysterectomy 4. A fem-fem arterial bypass noted.   03/12/2017 Mammogram   MM Right Breast 03/12/17 IMPRESSION: No mammographic or sonographic evidence of malignancy. No correlate identified for the abnormal enhancement seen within the right nipple on MRI of 02/27/2017.    04/02/2017 Pathology Results   Surgical Pathology of Nipple Biopsy: 04/02/17 Diagnosis Nipple Biopsy, Right - BENIGN NIPPLE TISSUE. - NO ATYPIA OR MALIGNANCY.    11/13/2017 Imaging   1. No findings of recurrent malignancy in the abdomen or pelvis. 2. Other imaging findings of potential clinical significance: Chronic AVN of both femoral heads without contour abnormality. Lumbar impingement at L4-5 and L5-S1. Aortic Atherosclerosis (ICD10-I70.0). 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 Partial right hemicolectomy. Hysterectomy.   03/22/2018 Mammogram   Screening mammogram IMPRESSION No evidence of malignancy.     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   06/06/2019 Imaging   1. Tiny bilateral pulmonary nodules stable since 11/12/2018. Continued close attention on follow-up recommended. 2. Incompletely visualized upper abdominal lymphadenopathy better characterized on the abdomen CT from 2 weeks ago. 3.  Aortic Atherosclerois (ICD10-170.0) 4.  Emphysema. ([id number])   06/07/2019 Procedure   Technically successful  CT guided core needle biopsy of indeterminate peritoneal nodule within the right lower  abdomen/pelvis.   06/07/2019 Pathology Results   Soft Tissue Needle Core Biopsy, peritoneal nodule within right lower abdomen - METASTATIC CARCINOMA CONSISTENT WITH PATIENT'S CLINICAL HISTORY OF PRIMARY OVARIAN CARCINOMA. SEE NOTE Diagnosis Note Immunohistochemical stains show that the tumor cells are positive for CK7, ER and PAX 8; and negative for CK20 and CDX2. This immunostaining profile is consistent with above diagnosis.   06/16/2019 Tumor Marker   Patient's tumor was tested for the following markers: CA-125 Results of the tumor marker test revealed 499   06/20/2019 - 11/14/2019 Chemotherapy   The patient had carboplatin and taxol   07/11/2019 Tumor Marker   Patient's tumor was tested for the following markers: CA-125 Results of the tumor marker test revealed 438   08/05/2019 Tumor Marker   Patient's tumor was tested for the following markers: CA-125 Results of the tumor marker test revealed 272.   08/25/2019 Imaging   Ct chest, abdomen and pelvis 1. Slight interval decrease in size of a right lower quadrant peritoneal nodule measuring 1.7 x 1.6 cm, previously 2.2 x 2.2 cm (series 2, image 94, series 5, image 58). Findings are consistent with treatment response.   2. No significant change in ill-defined soft tissue about the sigmoid colon in the hysterectomy bed (series 2, image 101).   3.  Status post hysterectomy.   4. No evidence of metastatic disease in the chest. Stable benign small pulmonary nodules.   5.  Coronary artery disease.  Aortic Atherosclerosis (ICD10-I70.0).     08/26/2019 Tumor Marker   Patient's tumor was tested for the following markers: CA-125 Results of the tumor marker test revealed 189.   09/26/2019 Tumor Marker   Patient's tumor was tested for the following markers: CA-125 Results of the tumor marker test revealed 137   10/03/2019 Tumor Marker   Patient's tumor was tested for the following markers: CA-125 Results of the tumor marker test revealed  137.   10/31/2019 Tumor Marker   Patient's tumor was tested for the following markers: CA-125 Results of the tumor marker test revealed 101   11/29/2019 Imaging   1. Right lower quadrant peritoneal implant is decreased. Stable irregular nodular pelvic peritoneal thickening superior to the vaginal cuff. 2. No new or progressive metastatic disease. No abdominopelvic lymphadenopathy. 3.  Aortic Atherosclerosis (ICD10-I70.0).   12/15/2019 -  Chemotherapy   The patient had Lynparza for chemotherapy treatment.     12/23/2019 Tumor Marker   Patient's tumor was tested for the following markers: CA-125 Results of the tumor marker test revealed 55.9   01/02/2020 Tumor Marker   Patient's tumor was tested for the following markers: CA-125. Results of the tumor marker test revealed 58.6   02/03/2020 Tumor Marker   Patient's tumor was tested for the following markers: CA-125 Results of the tumor marker test revealed 52.2   02/24/2020 Tumor Marker   Patient's tumor was tested for the following markers: CA-125 Results of the tumor marker test revealed 64.8   04/19/2020 Imaging   1. Continued slight interval decrease in size of a soft tissue nodule in the right paracolic gutter, consistent with treatment response. 2. Soft tissue in the hysterectomy bed near the distal sigmoid colon and rectum may be slightly decreased in size, difficult to measure due to configuration but apparently diminished in size, also consistent with treatment response. 3. Status post hysterectomy and oophorectomy as well as right colon resection. 4. No evidence of new  metastatic disease in the abdomen or pelvis. 5. Redemonstrated long segment occlusion of the left common, internal, and external iliac arteries. Status post femoral-femoral bypass grafting with reconstitution of the left common femoral artery. Aortic Atherosclerosis (ICD10-I70.0).   08/15/2020 Tumor Marker   Patient's tumor was tested for the following markers:  CA-125 Results of the tumor marker test revealed 47.1   10/22/2020 Imaging   1. Reduced size of the nodule along the right paracolic gutter, currently 0.6 by 1.2 cm, previously 0.7 by 1.4 cm on 04/19/2020 and previously 0.9 by 1.5 cm on 11/29/2019. Appearance compatible with treatment response. 2. Further mild reduction in size of the bandlike density along the rectosigmoid mesentery, probably from scarring or effectively treated tumor. 3. Prominent stool throughout the colon favors constipation.  4. Stable chronic occlusion of the left iliac arteries, with a patent fem-fem bypass. 5. 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 6. Lower lumbar spondylosis and degenerative disc disease causing impingement at L4-5 and L5-S1. 7. Aortic atherosclerosis.     10/23/2020 Tumor Marker   Patient's tumor was tested for the following markers: CA-125 Results of the tumor marker test revealed 74.6   01/21/2021 Tumor Marker   Patient's tumor was tested for the following markers: CA-125 Results of the tumor marker test revealed 825   01/28/2021 Imaging   1. Changes of right hemicolectomy with slightly decreased size of the nodule along the right pericolic gutter and similar band like density along the rectosigmoid mesentery. 2. Two new well-circumscribed low-density nodules in the presacral perirectal space, difficult to ascertain whether they arise from the bowel wall or arise from within the peritoneum. This is a nonspecific finding which could be further evaluated with colonoscopy, repeat CT with rectal contrast or attention on close interval follow-up imaging in 3 months. 3. 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 4. 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 5. Emphysema and aortic atherosclerosis. Aortic Atherosclerosis (ICD10-I70.0) and Emphysema (ICD10-J43.9).    02/08/2021 Procedure   Successful placement of a right internal jugular approach power injectable Port-A-Cath. The catheter is ready for immediate use.     02/11/2021 -  Chemotherapy    Patient is on Treatment Plan: OVARIAN RECURRENT 3RD LINE CARBOPLATIN D1 / GEMCITABINE D1,8 (4/800) Q21D WITH BEVACIZUMAB      02/18/2021 Tumor Marker   Patient's tumor was tested for the following markers: CA-125 Results of the tumor marker test revealed 852     REVIEW OF SYSTEMS:   Constitutional: Denies fevers, chills Eyes: Denies blurriness of vision Ears, nose, mouth, throat, and face: Denies mucositis or sore throat Respiratory: Denies cough, dyspnea or wheezes Cardiovascular: Denies palpitation, chest discomfort or lower extremity swelling Gastrointestinal:  Denies nausea, heartburn or change in bowel habits Skin: Denies abnormal skin rashes Lymphatics: Denies new lymphadenopathy or easy bruising Behavioral/Psych: Mood is stable, no new changes  All other systems were reviewed with the patient and are negative.  I have reviewed the past medical history, past surgical history, social history and family history with the patient and they are unchanged from previous note.  ALLERGIES:  is allergic to other, demerol [meperidine], morphine and related, oxycodone, penicillins, strawberry flavor, tylenol [acetaminophen], and ibuprofen.  MEDICATIONS:  Current Outpatient Medications  Medication Sig Dispense Refill  . acetaminophen (TYLENOL) 325 MG tablet Take 325 mg by mouth as needed.    . docusate sodium 100 MG  CAPS Take 100 mg by mouth 2 (two) times daily as needed for mild constipation. 10 capsule 0  . HYDROcodone-acetaminophen (NORCO/VICODIN) 5-325 MG tablet Take 1 tablet by mouth every 6 (six) hours as needed for moderate pain. 90 tablet 0  . lidocaine (LIDODERM) 5 % PLACE 1 PATCH ONTO THE SKIN EVERY DAY REMOVE AND DISCARD WITHIN 12 HOURS OR AS DIRECTED BY PRESCRIBER 90 patch 1  .  lidocaine-prilocaine (EMLA) cream Apply to affected area once 30 g 3  . losartan (COZAAR) 50 MG tablet Take 1 tablet (50 mg total) by mouth daily. 90 tablet 3  . ondansetron (ZOFRAN) 8 MG tablet TAKE 1 TABLET(8 MG) BY MOUTH EVERY 8 HOURS AS NEEDED FOR NAUSEA OR VOMITING 90 tablet 3  . ondansetron (ZOFRAN) 8 MG tablet Take 1 tablet (8 mg total) by mouth every 8 (eight) hours as needed. Start on the third day after chemotherapy. 30 tablet 1  . pregabalin (LYRICA) 200 MG capsule Take 1 capsule (200 mg total) by mouth every 8 (eight) hours. Please give patient brand name Lyrica 270 capsule 4  . prochlorperazine (COMPAZINE) 10 MG tablet TAKE 1 TABLET(10 MG) BY MOUTH EVERY 6 HOURS AS NEEDED FOR NAUSEA OR VOMITING 90 tablet 1  . prochlorperazine (COMPAZINE) 10 MG tablet Take 1 tablet (10 mg total) by mouth every 6 (six) hours as needed (Nausea or vomiting). 30 tablet 1   No current facility-administered medications for this visit.    PHYSICAL EXAMINATION: ECOG PERFORMANCE STATUS: 2 - Symptomatic, <50% confined to bed  Vitals:   03/04/21 1229  BP: 114/64  Pulse: 66  Resp: 18  Temp: 98 F (36.7 C)  SpO2: 100%   Filed Weights   03/04/21 1229  Weight: 137 lb 3.2 oz (62.2 kg)    GENERAL:alert, no distress and comfortable \Musculoskeletal:no cyanosis of digits and no clubbing  NEURO: alert & oriented x 3 with fluent speech, no focal motor/sensory deficits  LABORATORY DATA:  I have reviewed the data as listed    Component Value Date/Time   NA 141 03/04/2021 1210   NA 141 10/07/2017 1436   K 4.4 03/04/2021 1210   K 4.4 10/07/2017 1436   CL 106 03/04/2021 1210   CO2 25 03/04/2021 1210   CO2 26 10/07/2017 1436   GLUCOSE 96 03/04/2021 1210   GLUCOSE 85 10/07/2017 1436   BUN 12 03/04/2021 1210   BUN 14.9 10/07/2017 1436   CREATININE 0.77 03/04/2021 1210   CREATININE 1.0 10/07/2017 1436   CALCIUM 8.9 03/04/2021 1210   CALCIUM 9.5 10/07/2017 1436   PROT 6.8 03/04/2021 1210   PROT 7.8  10/07/2017 1436   ALBUMIN 3.7 03/04/2021 1210   ALBUMIN 3.9 10/07/2017 1436   AST 11 (L) 03/04/2021 1210   AST 15 10/07/2017 1436   ALT 6 03/04/2021 1210   ALT 7 10/07/2017 1436   ALKPHOS 62 03/04/2021 1210   ALKPHOS 81 10/07/2017 1436   BILITOT 0.3 03/04/2021 1210   BILITOT 0.54 10/07/2017 1436   GFRNONAA >60 03/04/2021 1210   GFRAA >60 08/15/2020 1121    No results found for: SPEP, UPEP  Lab Results  Component Value Date   WBC 4.0 03/04/2021   NEUTROABS 0.7 (L) 03/04/2021   HGB 8.9 (L) 03/04/2021   HCT 24.6 (L) 03/04/2021   MCV 107.4 (H) 03/04/2021   PLT 46 (L) 03/04/2021      Chemistry      Component Value Date/Time   NA 141 03/04/2021 1210   NA 141  10/07/2017 1436   K 4.4 03/04/2021 1210   K 4.4 10/07/2017 1436   CL 106 03/04/2021 1210   CO2 25 03/04/2021 1210   CO2 26 10/07/2017 1436   BUN 12 03/04/2021 1210   BUN 14.9 10/07/2017 1436   CREATININE 0.77 03/04/2021 1210   CREATININE 1.0 10/07/2017 1436      Component Value Date/Time   CALCIUM 8.9 03/04/2021 1210   CALCIUM 9.5 10/07/2017 1436   ALKPHOS 62 03/04/2021 1210   ALKPHOS 81 10/07/2017 1436   AST 11 (L) 03/04/2021 1210   AST 15 10/07/2017 1436   ALT 6 03/04/2021 1210   ALT 7 10/07/2017 1436   BILITOT 0.3 03/04/2021 1210   BILITOT 0.54 10/07/2017 1436

## 2021-03-04 NOTE — Assessment & Plan Note (Signed)
She has severe pancytopenia Recently, I have order iron studies and B12 and they were within normal range She denies recent alcohol intake Observe closely for now She is not ready to resume treatment She does not need transfusion support

## 2021-03-04 NOTE — Assessment & Plan Note (Signed)
She tolerated treatment very poorly due to severe pancytopenia I will delay her treatment by another week to allow bone marrow recovery I plan to reduce the dose of chemotherapy for both gemcitabine and carboplatin but keep bevacizumab at 15 mg/kg, adjusted to her recent weight loss I recommend minimum 3 cycles of treatment before repeat imaging study

## 2021-03-04 NOTE — Assessment & Plan Note (Signed)
She has chronic back pain and severe peripheral neuropathy from prior treatment I refill her prescription lidocaine patch today

## 2021-03-04 NOTE — Assessment & Plan Note (Signed)
She has recent profound night sweats, likely due to her disease I recommend the patient to increase oral fluid intake as tolerated

## 2021-03-04 NOTE — Assessment & Plan Note (Signed)
Her documented blood pressure at home is satisfactory Continue current prescribed blood pressure medications

## 2021-03-05 LAB — CA 125: Cancer Antigen (CA) 125: 541 U/mL — ABNORMAL HIGH (ref 0.0–38.1)

## 2021-03-11 ENCOUNTER — Inpatient Hospital Stay: Payer: 59

## 2021-03-11 ENCOUNTER — Other Ambulatory Visit: Payer: Self-pay | Admitting: Hematology and Oncology

## 2021-03-11 ENCOUNTER — Inpatient Hospital Stay (HOSPITAL_BASED_OUTPATIENT_CLINIC_OR_DEPARTMENT_OTHER): Payer: 59 | Admitting: Hematology and Oncology

## 2021-03-11 ENCOUNTER — Encounter: Payer: Self-pay | Admitting: Hematology and Oncology

## 2021-03-11 ENCOUNTER — Other Ambulatory Visit: Payer: Self-pay

## 2021-03-11 DIAGNOSIS — C562 Malignant neoplasm of left ovary: Secondary | ICD-10-CM

## 2021-03-11 DIAGNOSIS — Z5112 Encounter for antineoplastic immunotherapy: Secondary | ICD-10-CM | POA: Diagnosis not present

## 2021-03-11 DIAGNOSIS — C182 Malignant neoplasm of ascending colon: Secondary | ICD-10-CM

## 2021-03-11 DIAGNOSIS — G609 Hereditary and idiopathic neuropathy, unspecified: Secondary | ICD-10-CM | POA: Diagnosis not present

## 2021-03-11 DIAGNOSIS — I10 Essential (primary) hypertension: Secondary | ICD-10-CM | POA: Diagnosis not present

## 2021-03-11 DIAGNOSIS — D61818 Other pancytopenia: Secondary | ICD-10-CM

## 2021-03-11 LAB — CBC WITH DIFFERENTIAL (CANCER CENTER ONLY)
Abs Immature Granulocytes: 0 10*3/uL (ref 0.00–0.07)
Basophils Absolute: 0 10*3/uL (ref 0.0–0.1)
Basophils Relative: 0 %
Eosinophils Absolute: 0 10*3/uL (ref 0.0–0.5)
Eosinophils Relative: 0 %
HCT: 25.6 % — ABNORMAL LOW (ref 36.0–46.0)
Hemoglobin: 9.1 g/dL — ABNORMAL LOW (ref 12.0–15.0)
Immature Granulocytes: 0 %
Lymphocytes Relative: 66 %
Lymphs Abs: 2.3 10*3/uL (ref 0.7–4.0)
MCH: 38.9 pg — ABNORMAL HIGH (ref 26.0–34.0)
MCHC: 35.5 g/dL (ref 30.0–36.0)
MCV: 109.4 fL — ABNORMAL HIGH (ref 80.0–100.0)
Monocytes Absolute: 0.4 10*3/uL (ref 0.1–1.0)
Monocytes Relative: 10 %
Neutro Abs: 0.9 10*3/uL — ABNORMAL LOW (ref 1.7–7.7)
Neutrophils Relative %: 24 %
Platelet Count: 132 10*3/uL — ABNORMAL LOW (ref 150–400)
RBC: 2.34 MIL/uL — ABNORMAL LOW (ref 3.87–5.11)
RDW: 14.2 % (ref 11.5–15.5)
WBC Count: 3.6 10*3/uL — ABNORMAL LOW (ref 4.0–10.5)
nRBC: 0 % (ref 0.0–0.2)

## 2021-03-11 LAB — CMP (CANCER CENTER ONLY)
ALT: <6 U/L (ref 0–44)
AST: 14 U/L — ABNORMAL LOW (ref 15–41)
Albumin: 3.9 g/dL (ref 3.5–5.0)
Alkaline Phosphatase: 59 U/L (ref 38–126)
Anion gap: 11 (ref 5–15)
BUN: 13 mg/dL (ref 6–20)
CO2: 25 mmol/L (ref 22–32)
Calcium: 9 mg/dL (ref 8.9–10.3)
Chloride: 105 mmol/L (ref 98–111)
Creatinine: 0.81 mg/dL (ref 0.44–1.00)
GFR, Estimated: >60 mL/min (ref >60)
Glucose, Bld: 91 mg/dL (ref 70–99)
Potassium: 4.5 mmol/L (ref 3.5–5.1)
Sodium: 141 mmol/L (ref 135–145)
Total Bilirubin: 0.3 mg/dL (ref 0.3–1.2)
Total Protein: 7 g/dL (ref 6.5–8.1)

## 2021-03-11 MED ORDER — DIPHENHYDRAMINE HCL 50 MG/ML IJ SOLN
25.0000 mg | Freq: Once | INTRAMUSCULAR | Status: AC
  Administered 2021-03-11: 25 mg via INTRAVENOUS

## 2021-03-11 MED ORDER — SODIUM CHLORIDE 0.9% FLUSH
10.0000 mL | Freq: Once | INTRAVENOUS | Status: AC
  Administered 2021-03-11: 10 mL
  Filled 2021-03-11: qty 10

## 2021-03-11 MED ORDER — SODIUM CHLORIDE 0.9 % IV SOLN
Freq: Once | INTRAVENOUS | Status: AC
  Filled 2021-03-11: qty 250

## 2021-03-11 MED ORDER — DIPHENHYDRAMINE HCL 50 MG/ML IJ SOLN
INTRAMUSCULAR | Status: AC
  Filled 2021-03-11: qty 1

## 2021-03-11 MED ORDER — PALONOSETRON HCL INJECTION 0.25 MG/5ML
0.2500 mg | Freq: Once | INTRAVENOUS | Status: AC
Start: 2021-03-11 — End: 2021-03-11
  Administered 2021-03-11: 0.25 mg via INTRAVENOUS

## 2021-03-11 MED ORDER — SODIUM CHLORIDE 0.9 % IV SOLN
10.0000 mg | Freq: Once | INTRAVENOUS | Status: AC
  Administered 2021-03-11: 10 mg via INTRAVENOUS
  Filled 2021-03-11: qty 10
  Filled 2021-03-11: qty 1

## 2021-03-11 MED ORDER — FAMOTIDINE IN NACL 20-0.9 MG/50ML-% IV SOLN
20.0000 mg | Freq: Once | INTRAVENOUS | Status: AC
  Administered 2021-03-11: 20 mg via INTRAVENOUS

## 2021-03-11 MED ORDER — FAMOTIDINE IN NACL 20-0.9 MG/50ML-% IV SOLN
INTRAVENOUS | Status: AC
  Filled 2021-03-11: qty 50

## 2021-03-11 MED ORDER — HEPARIN SOD (PORK) LOCK FLUSH 100 UNIT/ML IV SOLN
500.0000 [IU] | Freq: Once | INTRAVENOUS | Status: AC | PRN
  Administered 2021-03-11: 500 [IU]
  Filled 2021-03-11: qty 5

## 2021-03-11 MED ORDER — SODIUM CHLORIDE 0.9% FLUSH
10.0000 mL | INTRAVENOUS | Status: DC | PRN
  Administered 2021-03-11: 10 mL
  Filled 2021-03-11: qty 10

## 2021-03-11 MED ORDER — SODIUM CHLORIDE 0.9 % IV SOLN
341.2500 mg | Freq: Once | INTRAVENOUS | Status: AC
  Administered 2021-03-11: 340 mg via INTRAVENOUS
  Filled 2021-03-11: qty 34

## 2021-03-11 MED ORDER — SODIUM CHLORIDE 0.9 % IV SOLN
15.0000 mg/kg | Freq: Once | INTRAVENOUS | Status: AC
  Administered 2021-03-11: 900 mg via INTRAVENOUS
  Filled 2021-03-11: qty 32

## 2021-03-11 MED ORDER — PALONOSETRON HCL INJECTION 0.25 MG/5ML
INTRAVENOUS | Status: AC
  Filled 2021-03-11: qty 5

## 2021-03-11 MED ORDER — SODIUM CHLORIDE 0.9 % IV SOLN
150.0000 mg | Freq: Once | INTRAVENOUS | Status: AC
  Administered 2021-03-11: 150 mg via INTRAVENOUS
  Filled 2021-03-11: qty 5
  Filled 2021-03-11: qty 150

## 2021-03-11 NOTE — Assessment & Plan Note (Signed)
Unfortunately, she still have significant pancytopenia although much improved compared to her last visit We will resume treatment but I plan changes to her treatment I will omit Gemzar today She will proceed with single agent carboplatin only along with bevacizumab as planned before I will also space out the interval between her treatment Instead of coming back next week for day 8, she will skip next week and come back in 2 weeks for Gemzar only treatment I will monitor her blood counts very carefully while she is on therapy

## 2021-03-11 NOTE — Progress Notes (Signed)
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 [facility]) Unfortunately, she still have significant pancytopenia although much improved compared to her last visit We will resume treatment but I plan changes to her treatment I will omit Gemzar today She will proceed with single agent carboplatin only along with bevacizumab as planned before I will also space out the interval between her treatment Instead of coming back next week for day 8, she will skip next week and come back in 2 weeks for Gemzar only treatment I will monitor her blood counts very carefully while she is on therapy  Pancytopenia, acquired [facility]) She has severe pancytopenia Recently, I have order iron studies and B12 and they were within normal range She denies recent alcohol intake Observe closely for now She does not need transfusion support I will omit Gemzar today and we will proceed with single agent carboplatinum  Essential hypertension Her blood pressure is low but she is not symptomatic Observe closely for now  Neuropathy, idiopathic Neuropathy is stable She will continue current prescribed pain medicine   No orders of the defined types were placed in this encounter.   All questions were answered. The patient knows to call the clinic with any problems, questions or concerns. The total time spent in the appointment was 40 minutes encounter with patients including review of chart and various tests results, discussions about plan of care and coordination of care plan   Heath Lark, MD 03/11/2021 1:04 PM  INTERVAL  HISTORY: Please see below for problem oriented charting. She returns for treatment today She felt slightly better compared to her last visit She denies abdominal pain no nausea She does take intermittent antiemetics No recent changes in her bowel habits Her energy level is fair No recent fever or chills  SUMMARY OF ONCOLOGIC HISTORY: Oncology History Overview Note  BRCA1 positive   Cancer of right colon ([place])  09/16/2014 Imaging   CT abd/pel:  a 3.8cm cecal mass with associated ileocecal intussusception, and a 9.1cm solid and cystic mass in the left pelvis,   09/18/2014 Surgery   right hemicolectomy and terminal ileoectomy   09/18/2014 Pathologic Stage   pT3pN1Mx, tumor extends into pericolonic soft tissue and is less than 51m from the serosal surface, LVI 8-), PNI (-), all 23 node negative, one soft tissue tumor deposit, surgical margins negative.    09/18/2014 Initial Diagnosis   Adenocarcinoma of right colon   08/06/2016 Imaging   CT chest/abd/pelvis with contrast IMPRESSION: 1. No recurrent malignancy identified. 2. 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 On the prior exam from 09/13/2015 there was back flow of contrast in the left external iliac artery to supply the left internal iliac artery ; this back flow of contrast is no longer present in the left external iliac artery is occluded. 3. Several pulmonary nodules at or below 4 mm diameter, unchanged. 4. 3 mm hypodensity posteriorly in the body of the pancreas, not well seen previously and subtle today, likely incidental, but merits observation. 5. 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 Prominent degenerative spurring of both acetabula. 6. Lumbar spondylosis and  degenerative disc disease causing prominent impingement at L4-5 and mild impingement at L5-S1.   03/08/2017 Imaging   CT CAP W Contrast 03/08/17 IMPRESSION: Chest  Impression: 1. Stable small pulmonary nodules within LEFT lung. 2. No mediastinal lymphadenopathy.  Abdomen / Pelvis Impression: 1. No evidence of metastatic colorectal cancer or ovarian cancer within the abdomen pelvis. 2. No lymphadenopathy. 3. Postsurgical change consistent RIGHT hemicolectomy and hysterectomy 4. A fem-fem arterial bypass noted.   11/13/2017 Imaging   CT AP w contrast IMPRESSION: 1. No findings of recurrent malignancy in the abdomen or pelvis. 2. Other imaging findings of potential clinical significance:Chronic AVN of both femoral heads without contour abnormality. Lumbar impingement at L4-5 and L5-S1. Aortic Atherosclerosis (ICD10-I70.0). 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 Partial right hemicolectomy. Hysterectomy.   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 This was discovered during her workup for colon cancer.   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 Tumor size 3.7 cm, no lymph nodes removed. T1cNx. Tumor cells are positive for cytokeratin 7 and estrogen receptor.   11/01/2014 Surgery   B/l Salpingo-oophorectomy.   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 Stable small pulmonary nodules within LEFT lung. 2. No mediastinal lymphadenopathy. Abdomen / Pelvis Impression: 1. No evidence of metastatic colorectal cancer or ovarian cancer within the abdomen pelvis. 2. No lymphadenopathy. 3. Postsurgical  change consistent RIGHT hemicolectomy and hysterectomy 4. A fem-fem arterial bypass noted.   03/12/2017 Mammogram   MM Right Breast 03/12/17 IMPRESSION: No mammographic or sonographic evidence of malignancy. No correlate identified for the abnormal enhancement seen within the right nipple on MRI of 02/27/2017.    04/02/2017 Pathology Results   Surgical Pathology of Nipple Biopsy: 04/02/17 Diagnosis Nipple Biopsy, Right - BENIGN NIPPLE TISSUE. - NO ATYPIA OR MALIGNANCY.    11/13/2017 Imaging   1. No findings of recurrent malignancy in the abdomen or pelvis. 2. Other imaging findings of potential clinical significance: Chronic AVN of both femoral heads without contour abnormality. Lumbar impingement at L4-5 and L5-S1. Aortic Atherosclerosis (ICD10-I70.0). 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 Partial right hemicolectomy. Hysterectomy.   03/22/2018 Mammogram   Screening mammogram IMPRESSION No evidence of malignancy.     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   06/06/2019 Imaging   1. Tiny bilateral pulmonary nodules stable since 11/12/2018. Continued close attention on follow-up recommended. 2. Incompletely visualized upper abdominal lymphadenopathy better characterized on the abdomen CT from 2 weeks ago. 3.  Aortic Atherosclerois (ICD10-170.0) 4.  Emphysema. (OIN86-V67.9)   06/07/2019 Procedure   Technically successful CT guided core needle biopsy of indeterminate peritoneal nodule within the right lower abdomen/pelvis.   06/07/2019 Pathology Results  Soft Tissue Needle Core Biopsy, peritoneal nodule within right lower abdomen - METASTATIC CARCINOMA CONSISTENT WITH PATIENT'S CLINICAL  HISTORY OF PRIMARY OVARIAN CARCINOMA. SEE NOTE Diagnosis Note Immunohistochemical stains show that the tumor cells are positive for CK7, ER and PAX 8; and negative for CK20 and CDX2. This immunostaining profile is consistent with above diagnosis.   06/16/2019 Tumor Marker   Patient's tumor was tested for the following markers: CA-125 Results of the tumor marker test revealed 499   06/20/2019 - 11/14/2019 Chemotherapy   The patient had carboplatin and taxol   07/11/2019 Tumor Marker   Patient's tumor was tested for the following markers: CA-125 Results of the tumor marker test revealed 438   08/05/2019 Tumor Marker   Patient's tumor was tested for the following markers: CA-125 Results of the tumor marker test revealed 272.   08/25/2019 Imaging   Ct chest, abdomen and pelvis 1. Slight interval decrease in size of a right lower quadrant peritoneal nodule measuring 1.7 x 1.6 cm, previously 2.2 x 2.2 cm (series 2, image 94, series 5, image 58). Findings are consistent with treatment response.   2. No significant change in ill-defined soft tissue about the sigmoid colon in the hysterectomy bed (series 2, image 101).   3.  Status post hysterectomy.   4. No evidence of metastatic disease in the chest. Stable benign small pulmonary nodules.   5.  Coronary artery disease.  Aortic Atherosclerosis (ICD10-I70.0).     08/26/2019 Tumor Marker   Patient's tumor was tested for the following markers: CA-125 Results of the tumor marker test revealed 189.   09/26/2019 Tumor Marker   Patient's tumor was tested for the following markers: CA-125 Results of the tumor marker test revealed 137   10/03/2019 Tumor Marker   Patient's tumor was tested for the following markers: CA-125 Results of the tumor marker test revealed 137.   10/31/2019 Tumor Marker   Patient's tumor was tested for the following markers: CA-125 Results of the tumor marker test revealed 101   11/29/2019 Imaging   1. Right lower  quadrant peritoneal implant is decreased. Stable irregular nodular pelvic peritoneal thickening superior to the vaginal cuff. 2. No new or progressive metastatic disease. No abdominopelvic lymphadenopathy. 3.  Aortic Atherosclerosis (ICD10-I70.0).   12/15/2019 -  Chemotherapy   The patient had Lynparza for chemotherapy treatment.     12/23/2019 Tumor Marker   Patient's tumor was tested for the following markers: CA-125 Results of the tumor marker test revealed 55.9   01/02/2020 Tumor Marker   Patient's tumor was tested for the following markers: CA-125. Results of the tumor marker test revealed 58.6   02/03/2020 Tumor Marker   Patient's tumor was tested for the following markers: CA-125 Results of the tumor marker test revealed 52.2   02/24/2020 Tumor Marker   Patient's tumor was tested for the following markers: CA-125 Results of the tumor marker test revealed 64.8   04/19/2020 Imaging   1. Continued slight interval decrease in size of a soft tissue nodule in the right paracolic gutter, consistent with treatment response. 2. Soft tissue in the hysterectomy bed near the distal sigmoid colon and rectum may be slightly decreased in size, difficult to measure due to configuration but apparently diminished in size, also consistent with treatment response. 3. Status post hysterectomy and oophorectomy as well as right colon resection. 4. No evidence of new metastatic disease in the abdomen or pelvis. 5. Redemonstrated long segment occlusion of the left common, internal, and external iliac arteries.  Status post femoral-femoral bypass grafting with reconstitution of the left common femoral artery. Aortic Atherosclerosis (ICD10-I70.0).   08/15/2020 Tumor Marker   Patient's tumor was tested for the following markers: CA-125 Results of the tumor marker test revealed 47.1   10/22/2020 Imaging   1. Reduced size of the nodule along the right paracolic gutter, currently 0.6 by 1.2 cm, previously 0.7 by  1.4 cm on 04/19/2020 and previously 0.9 by 1.5 cm on 11/29/2019. Appearance compatible with treatment response. 2. Further mild reduction in size of the bandlike density along the rectosigmoid mesentery, probably from scarring or effectively treated tumor. 3. Prominent stool throughout the colon favors constipation.  4. Stable chronic occlusion of the left iliac arteries, with a patent fem-fem bypass. 5. 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 6. Lower lumbar spondylosis and degenerative disc disease causing impingement at L4-5 and L5-S1. 7. Aortic atherosclerosis.     10/23/2020 Tumor Marker   Patient's tumor was tested for the following markers: CA-125 Results of the tumor marker test revealed 74.6   01/21/2021 Tumor Marker   Patient's tumor was tested for the following markers: CA-125 Results of the tumor marker test revealed 825   01/28/2021 Imaging   1. Changes of right hemicolectomy with slightly decreased size of the nodule along the right pericolic gutter and similar band like density along the rectosigmoid mesentery. 2. Two new well-circumscribed low-density nodules in the presacral perirectal space, difficult to ascertain whether they arise from the bowel wall or arise from within the peritoneum. This is a nonspecific finding which could be further evaluated with colonoscopy, repeat CT with rectal contrast or attention on close interval follow-up imaging in 3 months. 3. 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 4. 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 5. Emphysema and aortic atherosclerosis. Aortic Atherosclerosis (ICD10-I70.0) and Emphysema (ICD10-J43.9).   02/08/2021 Procedure   Successful placement of a right internal jugular approach power injectable Port-A-Cath. The catheter is ready for immediate use.     02/11/2021 -  Chemotherapy     Patient is on Treatment Plan: OVARIAN RECURRENT 3RD LINE CARBOPLATIN D1 / GEMCITABINE D1,8 (4/800) Q21D WITH BEVACIZUMAB      02/18/2021 Tumor Marker   Patient's tumor was tested for the following markers: CA-125 Results of the tumor marker test revealed 458   03/04/2021 Tumor Marker   Patient's tumor was tested for the following markers: CA-125. Results of the tumor marker test revealed 541     REVIEW OF SYSTEMS:   Constitutional: Denies fevers, chills or abnormal weight loss Eyes: Denies blurriness of vision Ears, nose, mouth, throat, and face: Denies mucositis or sore throat Respiratory: Denies cough, dyspnea or wheezes Cardiovascular: Denies palpitation, chest discomfort or lower extremity swelling Gastrointestinal:  Denies nausea, heartburn or change in bowel habits Skin: Denies abnormal skin rashes Lymphatics: Denies new lymphadenopathy or easy bruising Neurological:Denies numbness, tingling or new weaknesses Behavioral/Psych: Mood is stable, no new changes  All other systems were reviewed with the patient and are negative.  I have reviewed the past medical history, past surgical history, social history and family history with the patient and they are unchanged from previous note.  ALLERGIES:  is allergic to other, demerol [meperidine], morphine and related, oxycodone, penicillins, strawberry flavor, tylenol [acetaminophen], and ibuprofen.  MEDICATIONS:  Current Outpatient Medications  Medication Sig Dispense Refill  . acetaminophen (TYLENOL) 325 MG tablet Take 325  mg by mouth as needed.    . docusate sodium 100 MG CAPS Take 100 mg by mouth 2 (two) times daily as needed for mild constipation. 10 capsule 0  . HYDROcodone-acetaminophen (NORCO/VICODIN) 5-325 MG tablet Take 1 tablet by mouth every 6 (six) hours as needed for moderate pain. 90 tablet 0  . lidocaine (LIDODERM) 5 % PLACE 1 PATCH ONTO THE SKIN EVERY DAY REMOVE AND DISCARD WITHIN 12 HOURS OR AS DIRECTED BY PRESCRIBER  90 patch 1  . lidocaine-prilocaine (EMLA) cream Apply to affected area once 30 g 3  . losartan (COZAAR) 50 MG tablet Take 1 tablet (50 mg total) by mouth daily. 90 tablet 3  . ondansetron (ZOFRAN) 8 MG tablet TAKE 1 TABLET(8 MG) BY MOUTH EVERY 8 HOURS AS NEEDED FOR NAUSEA OR VOMITING 90 tablet 3  . ondansetron (ZOFRAN) 8 MG tablet Take 1 tablet (8 mg total) by mouth every 8 (eight) hours as needed. Start on the third day after chemotherapy. 30 tablet 1  . pregabalin (LYRICA) 200 MG capsule Take 1 capsule (200 mg total) by mouth every 8 (eight) hours. Please give patient brand name Lyrica 270 capsule 4  . prochlorperazine (COMPAZINE) 10 MG tablet TAKE 1 TABLET(10 MG) BY MOUTH EVERY 6 HOURS AS NEEDED FOR NAUSEA OR VOMITING 90 tablet 1  . prochlorperazine (COMPAZINE) 10 MG tablet Take 1 tablet (10 mg total) by mouth every 6 (six) hours as needed (Nausea or vomiting). 30 tablet 1   No current facility-administered medications for this visit.    PHYSICAL EXAMINATION: ECOG PERFORMANCE STATUS: 1 - Symptomatic but completely ambulatory  Vitals:   03/11/21 1234  BP: 99/73  Pulse: 72  Resp: 18  Temp: 97.6 F (36.4 C)  SpO2: 100%   Filed Weights   03/11/21 1234  Weight: 136 lb 12.8 oz (62.1 kg)    GENERAL:alert, no distress and comfortable Musculoskeletal:no cyanosis of digits and no clubbing  NEURO: alert & oriented x 3 with fluent speech, no focal motor/sensory deficits  LABORATORY DATA:  I have reviewed the data as listed    Component Value Date/Time   NA 141 03/04/2021 1210   NA 141 10/07/2017 1436   K 4.4 03/04/2021 1210   K 4.4 10/07/2017 1436   CL 106 03/04/2021 1210   CO2 25 03/04/2021 1210   CO2 26 10/07/2017 1436   GLUCOSE 96 03/04/2021 1210   GLUCOSE 85 10/07/2017 1436   BUN 12 03/04/2021 1210   BUN 14.9 10/07/2017 1436   CREATININE 0.77 03/04/2021 1210   CREATININE 1.0 10/07/2017 1436   CALCIUM 8.9 03/04/2021 1210   CALCIUM 9.5 10/07/2017 1436   PROT 6.8  03/04/2021 1210   PROT 7.8 10/07/2017 1436   ALBUMIN 3.7 03/04/2021 1210   ALBUMIN 3.9 10/07/2017 1436   AST 11 (L) 03/04/2021 1210   AST 15 10/07/2017 1436   ALT 6 03/04/2021 1210   ALT 7 10/07/2017 1436   ALKPHOS 62 03/04/2021 1210   ALKPHOS 81 10/07/2017 1436   BILITOT 0.3 03/04/2021 1210   BILITOT 0.54 10/07/2017 1436   GFRNONAA >60 03/04/2021 1210   GFRAA >60 08/15/2020 1121    No results found for: SPEP, UPEP  Lab Results  Component Value Date   WBC 3.6 (L) 03/11/2021   NEUTROABS 0.9 (L) 03/11/2021   HGB 9.1 (L) 03/11/2021   HCT 25.6 (L) 03/11/2021   MCV 109.4 (H) 03/11/2021   PLT 132 (L) 03/11/2021      Chemistry  Component Value Date/Time   NA 141 03/04/2021 1210   NA 141 10/07/2017 1436   K 4.4 03/04/2021 1210   K 4.4 10/07/2017 1436   CL 106 03/04/2021 1210   CO2 25 03/04/2021 1210   CO2 26 10/07/2017 1436   BUN 12 03/04/2021 1210   BUN 14.9 10/07/2017 1436   CREATININE 0.77 03/04/2021 1210   CREATININE 1.0 10/07/2017 1436      Component Value Date/Time   CALCIUM 8.9 03/04/2021 1210   CALCIUM 9.5 10/07/2017 1436   ALKPHOS 62 03/04/2021 1210   ALKPHOS 81 10/07/2017 1436   AST 11 (L) 03/04/2021 1210   AST 15 10/07/2017 1436   ALT 6 03/04/2021 1210   ALT 7 10/07/2017 1436   BILITOT 0.3 03/04/2021 1210   BILITOT 0.54 10/07/2017 1436

## 2021-03-11 NOTE — Assessment & Plan Note (Signed)
Her blood pressure is low but she is not symptomatic Observe closely for now

## 2021-03-11 NOTE — Assessment & Plan Note (Signed)
Neuropathy is stable She will continue current prescribed pain medicine

## 2021-03-11 NOTE — Patient Instructions (Signed)
[facility] Discharge Instructions for Patients Receiving Chemotherapy  Today you received the following chemotherapy agents: Bevacizumab, gemcitabine  To help prevent nausea and vomiting after your treatment, we encourage you to take your nausea medication as needed.   If you develop nausea and vomiting that is not controlled by your nausea medication, call the clinic.   BELOW ARE SYMPTOMS THAT SHOULD BE REPORTED IMMEDIATELY:  *FEVER GREATER THAN 100.5 F  *CHILLS WITH OR WITHOUT FEVER  NAUSEA AND VOMITING THAT IS NOT CONTROLLED WITH YOUR NAUSEA MEDICATION  *UNUSUAL SHORTNESS OF BREATH  *UNUSUAL BRUISING OR BLEEDING  TENDERNESS IN MOUTH AND THROAT WITH OR WITHOUT PRESENCE OF ULCERS  *URINARY PROBLEMS  *BOWEL PROBLEMS  UNUSUAL RASH Items with * indicate a potential emergency and should be followed up as soon as possible.  Feel free to call the clinic should you have any questions or concerns. The clinic phone number is [phone].  Please show the [facility] at check-in to the Emergency Department and triage nurse.  Bevacizumab injection What is this medicine? BEVACIZUMAB (be va SIZ yoo mab) is a monoclonal antibody. It is used to treat many types of cancer. This medicine may be used for other purposes; ask your health care provider or pharmacist if you have questions. COMMON BRAND NAME(S): Avastin, MVASI, Zirabev What should I tell my health care provider before I take this medicine? They need to know if you have any of these conditions:  diabetes  heart disease  high blood pressure  history of coughing up blood  prior anthracycline chemotherapy (e.g., doxorubicin, daunorubicin, epirubicin)  recent or ongoing radiation therapy  recent or planning to have surgery  stroke  an unusual or allergic reaction to bevacizumab, hamster proteins, mouse proteins, other medicines, foods, dyes, or preservatives  pregnant or trying to get  pregnant  breast-feeding How should I use this medicine? This medicine is for infusion into a vein. It is given by a health care professional in a hospital or clinic setting. Talk to your pediatrician regarding the use of this medicine in children. Special care may be needed. Overdosage: If you think you have taken too much of this medicine contact a poison control center or emergency room at once. NOTE: This medicine is only for you. Do not share this medicine with others. What if I miss a dose? It is important not to miss your dose. Call your doctor or health care professional if you are unable to keep an appointment. What may interact with this medicine? Interactions are not expected. This list may not describe all possible interactions. Give your health care provider a list of all the medicines, herbs, non-prescription drugs, or dietary supplements you use. Also tell them if you smoke, drink alcohol, or use illegal drugs. Some items may interact with your medicine. What should I watch for while using this medicine? Your condition will be monitored carefully while you are receiving this medicine. You will need important blood work and urine testing done while you are taking this medicine. This medicine may increase your risk to bruise or bleed. Call your doctor or health care professional if you notice any unusual bleeding. Before having surgery, talk to your health care provider to make sure it is ok. This drug can increase the risk of poor healing of your surgical site or wound. You will need to stop this drug for 28 days before surgery. After surgery, wait at least 28 days before restarting this drug. Make sure the  surgical site or wound is healed enough before restarting this drug. Talk to your health care provider if questions. Do not become pregnant while taking this medicine or for 6 months after stopping it. Women should inform their doctor if they wish to become pregnant or think they  might be pregnant. There is a potential for serious side effects to an unborn child. Talk to your health care professional or pharmacist for more information. Do not breast-feed an infant while taking this medicine and for 6 months after the last dose. This medicine has caused ovarian failure in some women. This medicine may interfere with the ability to have a child. You should talk to your doctor or health care professional if you are concerned about your fertility. What side effects may I notice from receiving this medicine? Side effects that you should report to your doctor or health care professional as soon as possible:  allergic reactions like skin rash, itching or hives, swelling of the face, lips, or tongue  chest pain or chest tightness  chills  coughing up blood  high fever  seizures  severe constipation  signs and symptoms of bleeding such as bloody or black, tarry stools; red or dark-brown urine; spitting up blood or brown material that looks like coffee grounds; red spots on the skin; unusual bruising or bleeding from the eye, gums, or nose  signs and symptoms of a blood clot such as breathing problems; chest pain; severe, sudden headache; pain, swelling, warmth in the leg  signs and symptoms of a stroke like changes in vision; confusion; trouble speaking or understanding; severe headaches; sudden numbness or weakness of the face, arm or leg; trouble walking; dizziness; loss of balance or coordination  stomach pain  sweating  swelling of legs or ankles  vomiting  weight gain Side effects that usually do not require medical attention (report to your doctor or health care professional if they continue or are bothersome):  back pain  changes in taste  decreased appetite  dry skin  nausea  tiredness This list may not describe all possible side effects. Call your doctor for medical advice about side effects. You may report side effects to FDA at  1-800-FDA-1088. Where should I keep my medicine? This drug is given in a hospital or clinic and will not be stored at home. NOTE: This sheet is a summary. It may not cover all possible information. If you have questions about this medicine, talk to your doctor, pharmacist, or health care provider.  2021 Elsevier/Gold Standard (2019-09-07 10:50:46)

## 2021-03-11 NOTE — Assessment & Plan Note (Signed)
She has severe pancytopenia Recently, I have order iron studies and B12 and they were within normal range She denies recent alcohol intake Observe closely for now She does not need transfusion support I will omit Gemzar today and we will proceed with single agent carboplatinum

## 2021-03-11 NOTE — Progress Notes (Signed)
Per Dr. Alvy Bimler, okay to proceed with treatment today without urine protein.

## 2021-03-12 ENCOUNTER — Telehealth: Payer: Self-pay | Admitting: *Deleted

## 2021-03-12 ENCOUNTER — Telehealth: Payer: Self-pay | Admitting: Hematology and Oncology

## 2021-03-12 LAB — CA 125: Cancer Antigen (CA) 125: 568 U/mL — ABNORMAL HIGH (ref 0.0–38.1)

## 2021-03-12 NOTE — Telephone Encounter (Signed)
-----   Message from Manuella Ghazi, RN sent at 03/11/2021  4:06 PM EDT ----- Regarding: Alvy Bimler- first time follow up Please follow up with patient. She received bevacizumab for the first time today 03/11/21.

## 2021-03-12 NOTE — Telephone Encounter (Signed)
Called pt to see how she did with her treatment yest.  She reports doing well & denies any problems.  She reports having some energy yest & got some things done. She reports knowing how to reach [place] if needed.

## 2021-03-12 NOTE — Telephone Encounter (Signed)
Scheduled appts per 4/18 sch msg. Called pt, no answer. Left msg with appts date and times.

## 2021-03-21 ENCOUNTER — Other Ambulatory Visit: Payer: Self-pay

## 2021-03-21 ENCOUNTER — Ambulatory Visit (INDEPENDENT_AMBULATORY_CARE_PROVIDER_SITE_OTHER): Payer: 59 | Admitting: Physician Assistant

## 2021-03-21 ENCOUNTER — Ambulatory Visit (HOSPITAL_COMMUNITY)
Admission: RE | Admit: 2021-03-21 | Discharge: 2021-03-21 | Disposition: A | Discharge status: Home / Self Care | Payer: 59 | Source: Ambulatory Visit | Attending: Vascular Surgery | Admitting: Vascular Surgery

## 2021-03-21 VITALS — BP 138/86 | HR 76 | Temp 98.0°F | Resp 20 | Ht 67.0 in | Wt 138.8 lb

## 2021-03-21 DIAGNOSIS — I739 Peripheral vascular disease, unspecified: Secondary | ICD-10-CM

## 2021-03-21 NOTE — Progress Notes (Signed)
History of Present Illness:  Patient is a 60 y.o. year old female who presents for evaluation of PAD.  She has history of right to left femoral- femoral bypass by Dr. Oneida Alar on 04/18/15. This was performed secondary to rest pain in her left foot. She has had continued pain in Left lower extremity even following revascularization. Her pain has felt to be multifactorial and likely part of her complex regional pain syndrome.    She denise non healing wounds and is working on a walking program.  She has advance from 30 sec. To 2 mins. On a treadmill for exercise.  She still has left leg pain, but she is tolerating it.    She has had a reoccurrence of ovarian cancer and she is dealing with this.   The pt is not on a statin for cholesterol management.  The pt is not on a daily aspirin.   Other AC: none The pt is on ARB for hypertension.   The pt is not diabetic.   Tobacco hx:  Smokes canibus for pain control  Past Medical History:  Diagnosis Date  . Anemia   . Anxiety   . Arthritis   . Asthma   . Cancer ([place]) dx'd 08/2014   colon and ovarian  . Complication of anesthesia    woke and saw long tube coming out of mouth . sat up then went back to sleep  . Depression   . Ectopic pregnancy   . Family history of adverse reaction to anesthesia    sister on dialysis- hypotension   . Family history of ovarian cancer   . Head injury, closed, with brief LOC ([place])    had a cut- as a child  . Headache   . Hepatitis    being treated for Hep C  . History of blood transfusion    2005 approximately   . Hypertension    no meds now  . PAD (peripheral artery disease) ([place]) 04/18/2015  . Pelvic cyst   . PONV (postoperative nausea and vomiting)   . PTSD (post-traumatic stress disorder)     Past Surgical History:  Procedure Laterality Date  . ABDOMINAL HYSTERECTOMY    . BREAST BIOPSY Right 04/02/2017   Procedure: INCISIONAL BIOPSY RIGHT NIPPLE;  Surgeon: Coralie Keens, MD;  Location: [facility];  Service: General;  Laterality: Right;  . COLONOSCOPY    . ESOPHAGOGASTRODUODENOSCOPY N/A 09/17/2014   Procedure: ESOPHAGOGASTRODUODENOSCOPY (EGD);  Surgeon: Jeryl Columbia, MD;  Location: [facility] ENDOSCOPY;  Service: Endoscopy;  Laterality: N/A;  . FEMORAL-FEMORAL BYPASS GRAFT Bilateral 04/18/2015   Procedure:  RIGHT COMMON FEMORAL TO LEFT COMMON  FEMORAL  ARTERY BYPASS WITH  8 MM HEMASHIELD GRAFT,RIGHT COMMON SUPERFICIAL FEMORAL ARTERY ENDARTERECTOMY;  Surgeon: Elam Dutch, MD;  Location: [facility];  Service: Vascular;  Laterality: Bilateral;  . ILEOSTOMY Right 09/18/2014   Procedure: ILEOCOLOSTOMY;  Surgeon: Doreen Salvage, MD;  Location: [facility];  Service: General;  Laterality: Right;  . IR IMAGING GUIDED PORT INSERTION  02/08/2021  . LAPAROTOMY N/A 11/01/2014   Procedure: EXPLORATORY LAPAROTOMY;  Surgeon: Doreen Salvage, MD;  Location: [facility];  Service: General;  Laterality: N/A;  . LAPAROTOMY N/A 12/26/2014   Procedure:  LAPAROTOMY ;  Surgeon: Everitt Amber, MD;  Location: [facility];  Service: Gynecology;  Laterality: N/A;  . MASS EXCISION  11/01/2014   Procedure: EXCISION PELVIC MASS;  Surgeon: Doreen Salvage, MD;  Location: [facility];  Service: General;;  . [facility]  2016  . PARTIAL COLECTOMY Right 09/18/2014   Procedure: RIGHT HEMI COLECTOMY;  Surgeon: Doreen Salvage, MD;  Location: [facility];  Service: General;  Laterality: Right;  . ROBOTIC ASSISTED TOTAL HYSTERECTOMY WITH BILATERAL SALPINGO OOPHERECTOMY Right 12/26/2014   Procedure: EXPLORATORY LAPAROSCOPY  RIGHT SALPINGO OOPHORECTOMY OMENTECTOMY LYMPHADECTOMY ;  Surgeon: Everitt Amber, MD;  Location: [facility];  Service: Gynecology;  Laterality: Right;  . TUBAL LIGATION      ROS:   General:  No weight loss, Fever, chills  HEENT: No recent headaches, no nasal bleeding, no visual changes, no sore throat  Neurologic: No dizziness, blackouts, seizures. No recent symptoms of stroke or mini- stroke. No recent episodes of slurred speech, or temporary  blindness.  Cardiac: No recent episodes of chest pain/pressure, no shortness of breath at rest.  No shortness of breath with exertion.  Denies history of atrial fibrillation or irregular heartbeat  Vascular: No history of rest pain in feet.  No history of claudication.  No history of non-healing ulcer, No history of DVT   Pulmonary: No home oxygen, no productive cough, no hemoptysis,  No asthma or wheezing  Musculoskeletal:  [ ]  Arthritis, [ ]  Low back pain,  [ ]  Joint pain  Hematologic:No history of hypercoagulable state.  No history of easy bleeding.  No history of anemia  Gastrointestinal: No hematochezia or melena,  No gastroesophageal reflux, no trouble swallowing  Urinary: [ ]  chronic Kidney disease, [ ]  on HD - [ ]  MWF or [ ]  TTHS, [ ]  Burning with urination, [ ]  Frequent urination, [ ]  Difficulty urinating;   Skin: No rashes  Psychological: No history of anxiety,  No history of depression  Social History Social History   Tobacco Use  . Smoking status: Current Some Day Smoker    Years: 10.00    Types: Cigars  . Smokeless tobacco: Never Used  . Tobacco comment: pack per month of cigars = 5  Vaping Use  . Vaping Use: Never used  Substance Use Topics  . Alcohol use: No  . Drug use: Not Currently    Frequency: 5.0 times per week    Types: Marijuana    Comment: last smoked 09/22/17    Family History Family History  Problem Relation Age of Onset  . Brain cancer Sister 49  . Lung cancer Brother 45  . Heart attack Mother   . Heart attack Father        25s  . Heart disease Father   . Heart attack Maternal Uncle   . Diabetes Maternal Uncle   . Breast cancer Maternal Uncle   . Ovarian cancer Paternal Aunt        3 paternal aunts with ovarian cancer  . Heart attack Brother   . Diabetes Paternal Uncle     Allergies  Allergies  Allergen Reactions  . Other Hives and Itching    Highly acidic foods "make me itch and break out" oranges, tomatoes  . Demerol  [Meperidine] Nausea And Vomiting  . Morphine And Related Nausea And Vomiting  . Oxycodone Itching  . Penicillins Other (See Comments)    Childhood allergy  . Strawberry Flavor Itching    Mrs Mccosh reports to [facility] RN CM she is allergic to Strawberries and anything strawberry flavored   . Tylenol [Acetaminophen] Nausea And Vomiting  . Ibuprofen Nausea And Vomiting     Current Outpatient Medications  Medication Sig Dispense Refill  . acetaminophen (TYLENOL) 325 MG tablet Take 325 mg by mouth as  needed.    . docusate sodium 100 MG CAPS Take 100 mg by mouth 2 (two) times daily as needed for mild constipation. 10 capsule 0  . HYDROcodone-acetaminophen (NORCO/VICODIN) 5-325 MG tablet Take 1 tablet by mouth every 6 (six) hours as needed for moderate pain. 90 tablet 0  . lidocaine (LIDODERM) 5 % PLACE 1 PATCH ONTO THE SKIN EVERY DAY REMOVE AND DISCARD WITHIN 12 HOURS OR AS DIRECTED BY PRESCRIBER 90 patch 1  . lidocaine-prilocaine (EMLA) cream Apply to affected area once 30 g 3  . losartan (COZAAR) 50 MG tablet Take 1 tablet (50 mg total) by mouth daily. 90 tablet 3  . ondansetron (ZOFRAN) 8 MG tablet TAKE 1 TABLET(8 MG) BY MOUTH EVERY 8 HOURS AS NEEDED FOR NAUSEA OR VOMITING 90 tablet 3  . ondansetron (ZOFRAN) 8 MG tablet Take 1 tablet (8 mg total) by mouth every 8 (eight) hours as needed. Start on the third day after chemotherapy. 30 tablet 1  . pregabalin (LYRICA) 200 MG capsule Take 1 capsule (200 mg total) by mouth every 8 (eight) hours. Please give patient brand name Lyrica 270 capsule 4  . prochlorperazine (COMPAZINE) 10 MG tablet TAKE 1 TABLET(10 MG) BY MOUTH EVERY 6 HOURS AS NEEDED FOR NAUSEA OR VOMITING 90 tablet 1  . prochlorperazine (COMPAZINE) 10 MG tablet Take 1 tablet (10 mg total) by mouth every 6 (six) hours as needed (Nausea or vomiting). 30 tablet 1   No current facility-administered medications for this visit.    Physical Examination  Vitals:   03/21/21 1430  BP: 138/86   Pulse: 76  Resp: 20  Temp: 98 F (36.7 C)  TempSrc: Temporal  SpO2: 100%  Weight: 138 lb 12.8 oz (63 kg)  Height: 5\' 7"  (1.702 m)    Body mass index is 21.74 kg/m.  General:  Alert and oriented, no acute distress HEENT: Normal Neck: No bruit or JVD Pulmonary: Clear to auscultation bilaterally Cardiac: Regular Rate and Rhythm without murmur Abdomen: Soft, non-tender, non-distended, no mass, no scars Skin: No rash, no wounds on legs or feet Extremity Pulses:  2+ radial, brachial, femoral, non palpable dorsalis pedis, posterior tibial  bilaterally Musculoskeletal: No deformity or edema  Neurologic: Upper and lower extremity motor 5/5 and symmetric  DATA:     ABI Findings:  +---------+------------------+-----+----------+--------+  Right  Rt Pressure (mmHg)IndexWaveform Comment   +---------+------------------+-----+----------+--------+  Brachial 146                      +---------+------------------+-----+----------+--------+  PTA   93        0.61 monophasic      +---------+------------------+-----+----------+--------+  DP    70        0.46 monophasic      +---------+------------------+-----+----------+--------+  Great Toe66        0.43            +---------+------------------+-----+----------+--------+   +---------+------------------+-----+----------+-------+  Left   Lt Pressure (mmHg)IndexWaveform Comment  +---------+------------------+-----+----------+-------+  Brachial 153                      +---------+------------------+-----+----------+-------+  PTA   67        0.44 monophasic      +---------+------------------+-----+----------+-------+  DP    66        0.43 monophasic      +---------+------------------+-----+----------+-------+  Great Toe64        0.42             +---------+------------------+-----+----------+-------+   +-------+-----------+-----------+------------+------------+  ABI/TBIToday's ABIToday's TBIPrevious ABIPrevious TBI  +-------+-----------+-----------+------------+------------+  Right 0.61    0.43    0.58    0.41      +-------+-----------+-----------+------------+------------+  Left  0.44    0.42    0.46    0.0       +-------+-----------+-----------+------------+------------+   Previous ABI on 12/21/20.    Summary:  Right: Resting right ankle-brachial index indicates moderate right lower  extremity arterial disease. The right toe-brachial index is abnormal. RT  great toe pressure = 66 mmHg.   Left: Resting left ankle-brachial index indicates severe left lower  extremity arterial disease. The left toe-brachial index is abnormal. LT  Great toe pressure = 64   ASSESSMENT:  PAD s/p right to left femoral- femoral bypass by Dr. Oneida Alar on 04/18/15. This was performed secondary to rest pain in her left foot. She has had continued pain in Left lower extremity even following revascularization. Her pain is multifactorial and a lot is neuropathic in nature.  Her symptoms have not changed from her prior visits. She continues to use lyrica and hydrocodone for pain management.   PLAN: She will cont. To increase her activity as tolerates.  If she develops a wound or rest pain she will call sooner other wise she will f/u for repeat ABI's.   Roxy Horseman PA-C [facility] Office: [phone]  MD in clinic [place]

## 2021-03-22 ENCOUNTER — Telehealth: Payer: Self-pay

## 2021-03-22 ENCOUNTER — Other Ambulatory Visit: Payer: Self-pay | Admitting: Hematology and Oncology

## 2021-03-22 MED ORDER — HYDROCODONE-ACETAMINOPHEN 5-325 MG PO TABS: 1.0000 | ORAL_TABLET | Freq: Four times a day (QID) | ORAL | 0 refills | Status: DC | PRN

## 2021-03-22 NOTE — Telephone Encounter (Signed)
Patient notified

## 2021-03-22 NOTE — Telephone Encounter (Signed)
done

## 2021-03-22 NOTE — Telephone Encounter (Signed)
Patient requesting refill on pain medication, Hydrocodone-Acetaminophen 5/325.   Last Rx sent 4/5.

## 2021-03-25 ENCOUNTER — Inpatient Hospital Stay (HOSPITAL_BASED_OUTPATIENT_CLINIC_OR_DEPARTMENT_OTHER): Payer: 59 | Admitting: Hematology and Oncology

## 2021-03-25 ENCOUNTER — Encounter: Payer: Self-pay | Admitting: Hematology and Oncology

## 2021-03-25 ENCOUNTER — Inpatient Hospital Stay: Payer: 59 | Attending: Hematology and Oncology

## 2021-03-25 ENCOUNTER — Other Ambulatory Visit: Payer: Self-pay

## 2021-03-25 ENCOUNTER — Inpatient Hospital Stay: Payer: 59

## 2021-03-25 VITALS — BP 167/81 | HR 78 | Temp 98.0°F | Resp 18 | Ht 67.0 in | Wt 136.0 lb

## 2021-03-25 DIAGNOSIS — Z90710 Acquired absence of both cervix and uterus: Secondary | ICD-10-CM | POA: Insufficient documentation

## 2021-03-25 DIAGNOSIS — G893 Neoplasm related pain (acute) (chronic): Secondary | ICD-10-CM | POA: Insufficient documentation

## 2021-03-25 DIAGNOSIS — I10 Essential (primary) hypertension: Secondary | ICD-10-CM | POA: Insufficient documentation

## 2021-03-25 DIAGNOSIS — D61818 Other pancytopenia: Secondary | ICD-10-CM

## 2021-03-25 DIAGNOSIS — G609 Hereditary and idiopathic neuropathy, unspecified: Secondary | ICD-10-CM | POA: Insufficient documentation

## 2021-03-25 DIAGNOSIS — C182 Malignant neoplasm of ascending colon: Secondary | ICD-10-CM

## 2021-03-25 DIAGNOSIS — Z79899 Other long term (current) drug therapy: Secondary | ICD-10-CM | POA: Insufficient documentation

## 2021-03-25 DIAGNOSIS — Z5111 Encounter for antineoplastic chemotherapy: Secondary | ICD-10-CM | POA: Insufficient documentation

## 2021-03-25 DIAGNOSIS — Z9221 Personal history of antineoplastic chemotherapy: Secondary | ICD-10-CM | POA: Insufficient documentation

## 2021-03-25 DIAGNOSIS — C562 Malignant neoplasm of left ovary: Secondary | ICD-10-CM | POA: Diagnosis present

## 2021-03-25 DIAGNOSIS — Z85038 Personal history of other malignant neoplasm of large intestine: Secondary | ICD-10-CM | POA: Diagnosis not present

## 2021-03-25 LAB — CBC WITH DIFFERENTIAL (CANCER CENTER ONLY)
Abs Immature Granulocytes: 0.01 10*3/uL (ref 0.00–0.07)
Basophils Absolute: 0 10*3/uL (ref 0.0–0.1)
Basophils Relative: 0 %
Eosinophils Absolute: 0 10*3/uL (ref 0.0–0.5)
Eosinophils Relative: 1 %
HCT: 28.3 % — ABNORMAL LOW (ref 36.0–46.0)
Hemoglobin: 9.9 g/dL — ABNORMAL LOW (ref 12.0–15.0)
Immature Granulocytes: 0 %
Lymphocytes Relative: 32 %
Lymphs Abs: 1.8 10*3/uL (ref 0.7–4.0)
MCH: 38.8 pg — ABNORMAL HIGH (ref 26.0–34.0)
MCHC: 35 g/dL (ref 30.0–36.0)
MCV: 111 fL — ABNORMAL HIGH (ref 80.0–100.0)
Monocytes Absolute: 0.4 10*3/uL (ref 0.1–1.0)
Monocytes Relative: 7 %
Neutro Abs: 3.2 10*3/uL (ref 1.7–7.7)
Neutrophils Relative %: 60 %
Platelet Count: 99 10*3/uL — ABNORMAL LOW (ref 150–400)
RBC: 2.55 MIL/uL — ABNORMAL LOW (ref 3.87–5.11)
RDW: 14.9 % (ref 11.5–15.5)
WBC Count: 5.4 10*3/uL (ref 4.0–10.5)
nRBC: 0 % (ref 0.0–0.2)

## 2021-03-25 LAB — CMP (CANCER CENTER ONLY)
ALT: <6 U/L (ref 0–44)
AST: 15 U/L (ref 15–41)
Albumin: 3.8 g/dL (ref 3.5–5.0)
Alkaline Phosphatase: 62 U/L (ref 38–126)
Anion gap: 10 (ref 5–15)
BUN: 14 mg/dL (ref 6–20)
CO2: 24 mmol/L (ref 22–32)
Calcium: 9 mg/dL (ref 8.9–10.3)
Chloride: 108 mmol/L (ref 98–111)
Creatinine: 0.77 mg/dL (ref 0.44–1.00)
GFR, Estimated: >60 mL/min (ref >60)
Glucose, Bld: 94 mg/dL (ref 70–99)
Potassium: 4.1 mmol/L (ref 3.5–5.1)
Sodium: 142 mmol/L (ref 135–145)
Total Bilirubin: 0.4 mg/dL (ref 0.3–1.2)
Total Protein: 7 g/dL (ref 6.5–8.1)

## 2021-03-25 MED ORDER — PROCHLORPERAZINE MALEATE 10 MG PO TABS
ORAL_TABLET | ORAL | Status: AC
  Filled 2021-03-25: qty 1

## 2021-03-25 MED ORDER — SODIUM CHLORIDE 0.9 % IV SOLN
Freq: Once | INTRAVENOUS | Status: AC
Start: 2021-03-25 — End: 2021-03-25
  Filled 2021-03-25: qty 250

## 2021-03-25 MED ORDER — SODIUM CHLORIDE 0.9% FLUSH
10.0000 mL | INTRAVENOUS | Status: DC | PRN
  Administered 2021-03-25: 10 mL
  Filled 2021-03-25: qty 10

## 2021-03-25 MED ORDER — AMLODIPINE BESYLATE 10 MG PO TABS: 10.0000 mg | ORAL_TABLET | Freq: Every day | ORAL | 3 refills | Status: DC

## 2021-03-25 MED ORDER — SODIUM CHLORIDE 0.9 % IV SOLN
400.0000 mg/m2 | Freq: Once | INTRAVENOUS | Status: AC
  Administered 2021-03-25: 684 mg via INTRAVENOUS
  Filled 2021-03-25: qty 17.99

## 2021-03-25 MED ORDER — PROCHLORPERAZINE MALEATE 10 MG PO TABS
10.0000 mg | ORAL_TABLET | Freq: Once | ORAL | Status: AC
  Administered 2021-03-25: 10 mg via ORAL

## 2021-03-25 MED ORDER — HEPARIN SOD (PORK) LOCK FLUSH 100 UNIT/ML IV SOLN
500.0000 [IU] | Freq: Once | INTRAVENOUS | Status: AC | PRN
  Administered 2021-03-25: 500 [IU]
  Filled 2021-03-25: qty 5

## 2021-03-25 NOTE — Assessment & Plan Note (Signed)
She has severe pancytopenia Recently, I have order iron studies and B12 and they were within normal range She denies recent alcohol intake Observe closely for now She does not need transfusion support I will proceed with Gemzar today we will plan to monitor her blood counts carefully

## 2021-03-25 NOTE — Progress Notes (Signed)
Per Dr. Alvy Bimler, ok to treat with platelets 99.

## 2021-03-25 NOTE — Assessment & Plan Note (Signed)
Her blood pressure is elevated likely due to side effects of bevacizumab I recommend addition of amlodipine She is instructed to continue blood pressure monitoring twice a day

## 2021-03-25 NOTE — Assessment & Plan Note (Signed)
She has mild persistent pancytopenia despite aggressive dose adjustments She will proceed with dose reduced gemcitabine today

## 2021-03-25 NOTE — Progress Notes (Signed)
[facility] OFFICE PROGRESS NOTE  Patient Care Team: Marrian Salvage, [place] as PCP - General (Internal Medicine) Judeth Horn, MD as Consulting Physician (General Surgery) Nat Math, MD as Referring Physician (Obstetrics and Gynecology) Elam Dutch, MD as Consulting Physician (Vascular Surgery) Heath Lark, MD as Consulting Physician (Hematology and Oncology)  ASSESSMENT & PLAN:  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 [facility]) She has mild persistent pancytopenia despite aggressive dose adjustments She will proceed with dose reduced gemcitabine today  Essential hypertension Her blood pressure is elevated likely due to side effects of bevacizumab I recommend addition of amlodipine She is instructed to continue blood pressure monitoring twice a day  Pancytopenia, acquired ([place]) She has severe pancytopenia Recently, I have order iron studies and B12 and they were within normal range She denies recent alcohol intake Observe closely for now She does not need transfusion support I will proceed with Gemzar today we will plan to monitor her blood counts carefully   No orders of the defined types were placed in this encounter.   All questions were answered. The patient knows to call the clinic with any problems, questions or concerns. The total time spent in the appointment was 20 minutes encounter with patients including review of chart and various tests results, discussions about plan of care and coordination of care plan   Heath Lark, MD 03/25/2021 12:00 PM  INTERVAL HISTORY: Please see below for problem oriented charting.  She returns for treatment and follow-up She feels okay She had some mild nausea but no vomiting Denies abdominal pain She noticed that her documented blood pressure at home is mildly elevated with systolic blood pressure around 150 The patient denies any recent signs or symptoms of bleeding such as spontaneous epistaxis, hematuria or  hematochezia.   SUMMARY OF ONCOLOGIC HISTORY: Oncology History Overview Note  BRCA1 positive   Cancer of right colon ([place])  09/16/2014 Imaging   CT abd/pel:  a 3.8cm cecal mass with associated ileocecal intussusception, and a 9.1cm solid and cystic mass in the left pelvis,   09/18/2014 Surgery   right hemicolectomy and terminal ileoectomy   09/18/2014 Pathologic Stage   pT3pN1Mx, tumor extends into pericolonic soft tissue and is less than 49m from the serosal surface, LVI 8-), PNI (-), all 23 node negative, one soft tissue tumor deposit, surgical margins negative.    09/18/2014 Initial Diagnosis   Adenocarcinoma of right colon   08/06/2016 Imaging   CT chest/abd/pelvis with contrast IMPRESSION: 1. No recurrent malignancy identified. 2. 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 On the prior exam from 09/13/2015 there was back flow of contrast in the left external iliac artery to supply the left internal iliac artery ; this back flow of contrast is no longer present in the left external iliac artery is occluded. 3. Several pulmonary nodules at or below 4 mm diameter, unchanged. 4. 3 mm hypodensity posteriorly in the body of the pancreas, not well seen previously and subtle today, likely incidental, but merits observation. 5. 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 Prominent degenerative spurring of both acetabula. 6. Lumbar spondylosis and degenerative disc disease causing prominent impingement at L4-5 and mild impingement at L5-S1.   03/08/2017 Imaging   CT CAP W Contrast 03/08/17 IMPRESSION: Chest Impression: 1. Stable small pulmonary nodules within LEFT lung. 2. No mediastinal lymphadenopathy.  Abdomen / Pelvis Impression: 1. No evidence of metastatic colorectal cancer or ovarian cancer within the  abdomen pelvis. 2. No lymphadenopathy. 3. Postsurgical change consistent RIGHT hemicolectomy  and hysterectomy 4. A fem-fem arterial bypass noted.   11/13/2017 Imaging   CT AP w contrast IMPRESSION: 1. No findings of recurrent malignancy in the abdomen or pelvis. 2. Other imaging findings of potential clinical significance:Chronic AVN of both femoral heads without contour abnormality. Lumbar impingement at L4-5 and L5-S1. Aortic Atherosclerosis (ICD10-I70.0). 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 Partial right hemicolectomy. Hysterectomy.   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 ([place])  10/09/2014 Imaging   CT of the abdomen and pelvis showed a 9.1 cm solid and cystic mass in the left pelvis concerning for malignancy. This was discovered during her workup for colon cancer.   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 Tumor size 3.7 cm, no lymph nodes removed. T1cNx. Tumor cells are positive for cytokeratin 7 and estrogen receptor.   11/01/2014 Surgery   B/l Salpingo-oophorectomy.   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 Stable small pulmonary nodules within LEFT lung. 2. No mediastinal lymphadenopathy. Abdomen / Pelvis Impression: 1. No evidence of metastatic colorectal cancer or ovarian cancer within the abdomen pelvis. 2. No lymphadenopathy. 3. Postsurgical change consistent RIGHT hemicolectomy and hysterectomy 4. A fem-fem arterial bypass noted.   03/12/2017 Mammogram   MM Right Breast 03/12/17 IMPRESSION: No mammographic or sonographic evidence of malignancy. No correlate identified for the abnormal enhancement seen within the right nipple on MRI of  02/27/2017.    04/02/2017 Pathology Results   Surgical Pathology of Nipple Biopsy: 04/02/17 Diagnosis Nipple Biopsy, Right - BENIGN NIPPLE TISSUE. - NO ATYPIA OR MALIGNANCY.    11/13/2017 Imaging   1. No findings of recurrent malignancy in the abdomen or pelvis. 2. Other imaging findings of potential clinical significance: Chronic AVN of both femoral heads without contour abnormality. Lumbar impingement at L4-5 and L5-S1. Aortic Atherosclerosis (ICD10-I70.0). 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 Partial right hemicolectomy. Hysterectomy.   03/22/2018 Mammogram   Screening mammogram IMPRESSION No evidence of malignancy.     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   06/06/2019 Imaging   1. Tiny bilateral pulmonary nodules stable since 11/12/2018. Continued close attention on follow-up recommended. 2. Incompletely visualized upper abdominal lymphadenopathy better characterized on the abdomen CT from 2 weeks ago. 3.  Aortic Atherosclerois (ICD10-170.0) 4.  Emphysema. (NID78-E42.9)   06/07/2019 Procedure   Technically successful CT guided core needle biopsy of indeterminate peritoneal nodule within the right lower abdomen/pelvis.   06/07/2019 Pathology Results   Soft Tissue Needle Core Biopsy, peritoneal nodule within right lower abdomen - METASTATIC CARCINOMA CONSISTENT WITH PATIENT'S CLINICAL HISTORY OF PRIMARY OVARIAN CARCINOMA. SEE NOTE Diagnosis Note Immunohistochemical stains show that the tumor cells are positive for CK7, ER and PAX 8; and negative for CK20 and CDX2. This immunostaining profile is consistent with  above diagnosis.   06/16/2019 Tumor Marker   Patient's tumor was tested  for the following markers: CA-125 Results of the tumor marker test revealed 499   06/20/2019 - 11/14/2019 Chemotherapy   The patient had carboplatin and taxol   07/11/2019 Tumor Marker   Patient's tumor was tested for the following markers: CA-125 Results of the tumor marker test revealed 438   08/05/2019 Tumor Marker   Patient's tumor was tested for the following markers: CA-125 Results of the tumor marker test revealed 272.   08/25/2019 Imaging   Ct chest, abdomen and pelvis 1. Slight interval decrease in size of a right lower quadrant peritoneal nodule measuring 1.7 x 1.6 cm, previously 2.2 x 2.2 cm (series 2, image 94, series 5, image 58). Findings are consistent with treatment response.   2. No significant change in ill-defined soft tissue about the sigmoid colon in the hysterectomy bed (series 2, image 101).   3.  Status post hysterectomy.   4. No evidence of metastatic disease in the chest. Stable benign small pulmonary nodules.   5.  Coronary artery disease.  Aortic Atherosclerosis (ICD10-I70.0).     08/26/2019 Tumor Marker   Patient's tumor was tested for the following markers: CA-125 Results of the tumor marker test revealed 189.   09/26/2019 Tumor Marker   Patient's tumor was tested for the following markers: CA-125 Results of the tumor marker test revealed 137   10/03/2019 Tumor Marker   Patient's tumor was tested for the following markers: CA-125 Results of the tumor marker test revealed 137.   10/31/2019 Tumor Marker   Patient's tumor was tested for the following markers: CA-125 Results of the tumor marker test revealed 101   11/29/2019 Imaging   1. Right lower quadrant peritoneal implant is decreased. Stable irregular nodular pelvic peritoneal thickening superior to the vaginal cuff. 2. No new or progressive metastatic disease. No abdominopelvic lymphadenopathy. 3.  Aortic Atherosclerosis (ICD10-I70.0).   12/15/2019 -  Chemotherapy   The patient had Lynparza for  chemotherapy treatment.     12/23/2019 Tumor Marker   Patient's tumor was tested for the following markers: CA-125 Results of the tumor marker test revealed 55.9   01/02/2020 Tumor Marker   Patient's tumor was tested for the following markers: CA-125. Results of the tumor marker test revealed 58.6   02/03/2020 Tumor Marker   Patient's tumor was tested for the following markers: CA-125 Results of the tumor marker test revealed 52.2   02/24/2020 Tumor Marker   Patient's tumor was tested for the following markers: CA-125 Results of the tumor marker test revealed 64.8   04/19/2020 Imaging   1. Continued slight interval decrease in size of a soft tissue nodule in the right paracolic gutter, consistent with treatment response. 2. Soft tissue in the hysterectomy bed near the distal sigmoid colon and rectum may be slightly decreased in size, difficult to measure due to configuration but apparently diminished in size, also consistent with treatment response. 3. Status post hysterectomy and oophorectomy as well as right colon resection. 4. No evidence of new metastatic disease in the abdomen or pelvis. 5. Redemonstrated long segment occlusion of the left common, internal, and external iliac arteries. Status post femoral-femoral bypass grafting with reconstitution of the left common femoral artery. Aortic Atherosclerosis (ICD10-I70.0).   08/15/2020 Tumor Marker   Patient's tumor was tested for the following markers: CA-125 Results of the tumor marker test revealed 47.1   10/22/2020 Imaging   1. Reduced size of the nodule along the  right paracolic gutter, currently 0.6 by 1.2 cm, previously 0.7 by 1.4 cm on 04/19/2020 and previously 0.9 by 1.5 cm on 11/29/2019. Appearance compatible with treatment response. 2. Further mild reduction in size of the bandlike density along the rectosigmoid mesentery, probably from scarring or effectively treated tumor. 3. Prominent stool throughout the colon favors  constipation.  4. Stable chronic occlusion of the left iliac arteries, with a patent fem-fem bypass. 5. 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 6. Lower lumbar spondylosis and degenerative disc disease causing impingement at L4-5 and L5-S1. 7. Aortic atherosclerosis.     10/23/2020 Tumor Marker   Patient's tumor was tested for the following markers: CA-125 Results of the tumor marker test revealed 74.6   01/21/2021 Tumor Marker   Patient's tumor was tested for the following markers: CA-125 Results of the tumor marker test revealed 825   01/28/2021 Imaging   1. Changes of right hemicolectomy with slightly decreased size of the nodule along the right pericolic gutter and similar band like density along the rectosigmoid mesentery. 2. Two new well-circumscribed low-density nodules in the presacral perirectal space, difficult to ascertain whether they arise from the bowel wall or arise from within the peritoneum. This is a nonspecific finding which could be further evaluated with colonoscopy, repeat CT with rectal contrast or attention on close interval follow-up imaging in 3 months. 3. 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 4. 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 5. Emphysema and aortic atherosclerosis. Aortic Atherosclerosis (ICD10-I70.0) and Emphysema (ICD10-J43.9).   02/08/2021 Procedure   Successful placement of a right internal jugular approach power injectable Port-A-Cath. The catheter is ready for immediate use.     02/11/2021 -  Chemotherapy    Patient is on Treatment Plan: OVARIAN RECURRENT 3RD LINE CARBOPLATIN D1 / GEMCITABINE D1,8 (4/800) Q21D WITH BEVACIZUMAB      02/18/2021 Tumor Marker   Patient's tumor was tested for the following markers: CA-125 Results of the tumor marker test revealed 245   03/04/2021 Tumor Marker   Patient's  tumor was tested for the following markers: CA-125. Results of the tumor marker test revealed 541   03/11/2021 Tumor Marker   Patient's tumor was tested for the following markers: CA-125 Results of the tumor marker test revealed 568     REVIEW OF SYSTEMS:   Constitutional: Denies fevers, chills or abnormal weight loss Eyes: Denies blurriness of vision Ears, nose, mouth, throat, and face: Denies mucositis or sore throat Respiratory: Denies cough, dyspnea or wheezes Cardiovascular: Denies palpitation, chest discomfort or lower extremity swelling Skin: Denies abnormal skin rashes Lymphatics: Denies new lymphadenopathy or easy bruising Neurological:Denies numbness, tingling or new weaknesses Behavioral/Psych: Mood is stable, no new changes  All other systems were reviewed with the patient and are negative.  I have reviewed the past medical history, past surgical history, social history and family history with the patient and they are unchanged from previous note.  ALLERGIES:  is allergic to other, demerol [meperidine], morphine and related, oxycodone, penicillins, strawberry flavor, tylenol [acetaminophen], and ibuprofen.  MEDICATIONS:  Current Outpatient Medications  Medication Sig Dispense Refill  . amLODipine (NORVASC) 10 MG tablet Take 1 tablet (10 mg total) by mouth daily. 30 tablet 3  . acetaminophen (TYLENOL) 325 MG tablet Take 325 mg by mouth as needed.    . docusate sodium 100 MG CAPS Take 100 mg by mouth 2 (two)  times daily as needed for mild constipation. 10 capsule 0  . HYDROcodone-acetaminophen (NORCO/VICODIN) 5-325 MG tablet Take 1 tablet by mouth every 6 (six) hours as needed for moderate pain. 90 tablet 0  . lidocaine (LIDODERM) 5 % PLACE 1 PATCH ONTO THE SKIN EVERY DAY REMOVE AND DISCARD WITHIN 12 HOURS OR AS DIRECTED BY PRESCRIBER 90 patch 1  . lidocaine-prilocaine (EMLA) cream Apply to affected area once 30 g 3  . losartan (COZAAR) 50 MG tablet Take 1 tablet (50 mg  total) by mouth daily. 90 tablet 3  . ondansetron (ZOFRAN) 8 MG tablet TAKE 1 TABLET(8 MG) BY MOUTH EVERY 8 HOURS AS NEEDED FOR NAUSEA OR VOMITING 90 tablet 3  . ondansetron (ZOFRAN) 8 MG tablet Take 1 tablet (8 mg total) by mouth every 8 (eight) hours as needed. Start on the third day after chemotherapy. 30 tablet 1  . pregabalin (LYRICA) 200 MG capsule Take 1 capsule (200 mg total) by mouth every 8 (eight) hours. Please give patient brand name Lyrica 270 capsule 4  . prochlorperazine (COMPAZINE) 10 MG tablet TAKE 1 TABLET(10 MG) BY MOUTH EVERY 6 HOURS AS NEEDED FOR NAUSEA OR VOMITING 90 tablet 1  . prochlorperazine (COMPAZINE) 10 MG tablet Take 1 tablet (10 mg total) by mouth every 6 (six) hours as needed (Nausea or vomiting). 30 tablet 1   No current facility-administered medications for this visit.    PHYSICAL EXAMINATION: ECOG PERFORMANCE STATUS: 1 - Symptomatic but completely ambulatory  Vitals:   03/25/21 1134  BP: (!) 167/81  Pulse: 78  Resp: 18  Temp: 98 F (36.7 C)  SpO2: 100%   Filed Weights   03/25/21 1134  Weight: 136 lb (61.7 kg)    GENERAL:alert, no distress and comfortable NEURO: alert & oriented x 3 with fluent speech, no focal motor/sensory deficits  LABORATORY DATA:  I have reviewed the data as listed    Component Value Date/Time   NA 141 03/11/2021 1206   NA 141 10/07/2017 1436   K 4.5 03/11/2021 1206   K 4.4 10/07/2017 1436   CL 105 03/11/2021 1206   CO2 25 03/11/2021 1206   CO2 26 10/07/2017 1436   GLUCOSE 91 03/11/2021 1206   GLUCOSE 85 10/07/2017 1436   BUN 13 03/11/2021 1206   BUN 14.9 10/07/2017 1436   CREATININE 0.81 03/11/2021 1206   CREATININE 1.0 10/07/2017 1436   CALCIUM 9.0 03/11/2021 1206   CALCIUM 9.5 10/07/2017 1436   PROT 7.0 03/11/2021 1206   PROT 7.8 10/07/2017 1436   ALBUMIN 3.9 03/11/2021 1206   ALBUMIN 3.9 10/07/2017 1436   AST 14 (L) 03/11/2021 1206   AST 15 10/07/2017 1436   ALT <6 03/11/2021 1206   ALT 7 10/07/2017  1436   ALKPHOS 59 03/11/2021 1206   ALKPHOS 81 10/07/2017 1436   BILITOT 0.3 03/11/2021 1206   BILITOT 0.54 10/07/2017 1436   GFRNONAA >60 03/11/2021 1206   GFRAA >60 08/15/2020 1121    No results found for: SPEP, UPEP  Lab Results  Component Value Date   WBC 5.4 03/25/2021   NEUTROABS 3.2 03/25/2021   HGB 9.9 (L) 03/25/2021   HCT 28.3 (L) 03/25/2021   MCV 111.0 (H) 03/25/2021   PLT 99 (L) 03/25/2021      Chemistry      Component Value Date/Time   NA 141 03/11/2021 1206   NA 141 10/07/2017 1436   K 4.5 03/11/2021 1206   K 4.4 10/07/2017 1436   CL 105 03/11/2021  1206   CO2 25 03/11/2021 1206   CO2 26 10/07/2017 1436   BUN 13 03/11/2021 1206   BUN 14.9 10/07/2017 1436   CREATININE 0.81 03/11/2021 1206   CREATININE 1.0 10/07/2017 1436      Component Value Date/Time   CALCIUM 9.0 03/11/2021 1206   CALCIUM 9.5 10/07/2017 1436   ALKPHOS 59 03/11/2021 1206   ALKPHOS 81 10/07/2017 1436   AST 14 (L) 03/11/2021 1206   AST 15 10/07/2017 1436   ALT <6 03/11/2021 1206   ALT 7 10/07/2017 1436   BILITOT 0.3 03/11/2021 1206   BILITOT 0.54 10/07/2017 1436       RADIOGRAPHIC STUDIES: I have personally reviewed the radiological images as listed and agreed with the findings in the report. VAS [place] ABI WITH/WO TBI  Result Date: 03/21/2021  LOWER EXTREMITY DOPPLER STUDY Patient Name:  NNEOMA HARRAL  Date of Exam:   03/21/2021 Medical Rec #: [id number]        Accession #:    [id number] Date of Birth: 1961/05/01        Patient Gender: F Patient Age:   60Y Exam Location:  [facility] Procedure:      VAS [place] ABI WITH/WO TBI Referring Phys: 2025 Karoline Caldwell --------------------------------------------------------------------------------   Vascular Interventions: 04/18/2015: Right to left femoral-femoral bypass, right                         common and superficial                         femoral endarterectomy. Performing Technologist: Ralene Cork RVT  Examination  Guidelines: A complete evaluation includes at minimum, Doppler waveform signals and systolic blood pressure reading at the level of bilateral brachial, anterior tibial, and posterior tibial arteries, when vessel segments are accessible. Bilateral testing is considered an integral part of a complete examination. Photoelectric Plethysmograph (PPG) waveforms and toe systolic pressure readings are included as required and additional duplex testing as needed. Limited examinations for reoccurring indications may be performed as noted.  ABI Findings: +---------+------------------+-----+----------+--------+ Right    Rt Pressure (mmHg)IndexWaveform  Comment  +---------+------------------+-----+----------+--------+ Brachial 146                                       +---------+------------------+-----+----------+--------+ PTA      93                0.61 monophasic         +---------+------------------+-----+----------+--------+ DP       70                0.46 monophasic         +---------+------------------+-----+----------+--------+ Great Toe66                0.43                    +---------+------------------+-----+----------+--------+ +---------+------------------+-----+----------+-------+ Left     Lt Pressure (mmHg)IndexWaveform  Comment +---------+------------------+-----+----------+-------+ Brachial 153                                      +---------+------------------+-----+----------+-------+ PTA      67                0.44 monophasic        +---------+------------------+-----+----------+-------+  DP       66                0.43 monophasic        +---------+------------------+-----+----------+-------+ Great Toe64                0.42                   +---------+------------------+-----+----------+-------+ +-------+-----------+-----------+------------+------------+ ABI/TBIToday's ABIToday's TBIPrevious ABIPrevious TBI  +-------+-----------+-----------+------------+------------+ Right  0.61       0.43       0.58        0.41         +-------+-----------+-----------+------------+------------+ Left   0.44       0.42       0.46        0.0          +-------+-----------+-----------+------------+------------+  Previous ABI on 12/21/20.  Summary: Right: Resting right ankle-brachial index indicates moderate right lower extremity arterial disease. The right toe-brachial index is abnormal. RT great toe pressure = 66 mmHg. Left: Resting left ankle-brachial index indicates severe left lower extremity arterial disease. The left toe-brachial index is abnormal. LT Great toe pressure = 64 mmHg.  *See table(s) above for measurements and observations.  Electronically signed by Ruta Hinds MD on 03/21/2021 at 2:28:12 PM.    Final

## 2021-03-25 NOTE — Patient Instructions (Signed)
[facility]  Discharge Instructions: Thank you for choosing [facility] to provide your oncology and hematology care.   If you have a lab appointment with the [facility], please go directly to the [facility] and check in at the registration area.   Wear comfortable clothing and clothing appropriate for easy access to any Portacath or PICC line.   We strive to give you quality time with your provider. You may need to reschedule your appointment if you arrive late (15 or more minutes).  Arriving late affects you and other patients whose appointments are after yours.  Also, if you miss three or more appointments without notifying the office, you may be dismissed from the clinic at the provider's discretion.      For prescription refill requests, have your pharmacy contact our office and allow 72 hours for refills to be completed.    Today you received the following chemotherapy and/or immunotherapy agents: gemcitabine.     To help prevent nausea and vomiting after your treatment, we encourage you to take your nausea medication as directed.  BELOW ARE SYMPTOMS THAT SHOULD BE REPORTED IMMEDIATELY: . *FEVER GREATER THAN 100.4 F (38 C) OR HIGHER . *CHILLS OR SWEATING . *NAUSEA AND VOMITING THAT IS NOT CONTROLLED WITH YOUR NAUSEA MEDICATION . *UNUSUAL SHORTNESS OF BREATH . *UNUSUAL BRUISING OR BLEEDING . *URINARY PROBLEMS (pain or burning when urinating, or frequent urination) . *BOWEL PROBLEMS (unusual diarrhea, constipation, pain near the anus) . TENDERNESS IN MOUTH AND THROAT WITH OR WITHOUT PRESENCE OF ULCERS (sore throat, sores in mouth, or a toothache) . UNUSUAL RASH, SWELLING OR PAIN  . UNUSUAL VAGINAL DISCHARGE OR ITCHING   Items with * indicate a potential emergency and should be followed up as soon as possible or go to the Emergency Department if any problems should occur.  Please show the CHEMOTHERAPY ALERT CARD or IMMUNOTHERAPY  ALERT CARD at check-in to the Emergency Department and triage nurse.  Should you have questions after your visit or need to cancel or reschedule your appointment, please contact [facility]  Dept: [phone]  and follow the prompts.  Office hours are 8:00 a.m. to 4:30 p.m. Monday - Friday. Please note that voicemails left after 4:00 p.m. may not be returned until the following business day.  We are closed weekends and major holidays. You have access to a nurse at all times for urgent questions. Please call the main number to the clinic Dept: [phone] and follow the prompts.   For any non-urgent questions, you may also contact your provider using MyChart. We now offer e-Visits for anyone 62 and older to request care online for non-urgent symptoms. For details visit [email or web address].   Also download the MyChart app! Go to the app store, search "MyChart", open the app, select [facility], and log in with your MyChart username and password.  Due to Covid, a mask is required upon entering the hospital/clinic. If you do not have a mask, one will be given to you upon arrival. For doctor visits, patients may have 1 support person aged 54 or older with them. For treatment visits, patients cannot have anyone with them due to current Covid guidelines and our immunocompromised population.

## 2021-03-25 NOTE — Patient Instructions (Signed)

## 2021-03-26 LAB — CA 125: Cancer Antigen (CA) 125: 367 U/mL — ABNORMAL HIGH (ref 0.0–38.1)

## 2021-04-08 ENCOUNTER — Inpatient Hospital Stay (HOSPITAL_BASED_OUTPATIENT_CLINIC_OR_DEPARTMENT_OTHER): Payer: 59 | Admitting: Hematology and Oncology

## 2021-04-08 ENCOUNTER — Inpatient Hospital Stay: Payer: 59

## 2021-04-08 ENCOUNTER — Encounter: Payer: Self-pay | Admitting: Hematology and Oncology

## 2021-04-08 ENCOUNTER — Other Ambulatory Visit: Payer: Self-pay

## 2021-04-08 DIAGNOSIS — C182 Malignant neoplasm of ascending colon: Secondary | ICD-10-CM

## 2021-04-08 DIAGNOSIS — I10 Essential (primary) hypertension: Secondary | ICD-10-CM | POA: Diagnosis not present

## 2021-04-08 DIAGNOSIS — Z5111 Encounter for antineoplastic chemotherapy: Secondary | ICD-10-CM | POA: Diagnosis not present

## 2021-04-08 DIAGNOSIS — G893 Neoplasm related pain (acute) (chronic): Secondary | ICD-10-CM | POA: Diagnosis not present

## 2021-04-08 DIAGNOSIS — C562 Malignant neoplasm of left ovary: Secondary | ICD-10-CM

## 2021-04-08 DIAGNOSIS — D61818 Other pancytopenia: Secondary | ICD-10-CM | POA: Diagnosis not present

## 2021-04-08 LAB — CMP (CANCER CENTER ONLY)
ALT: <6 U/L (ref 0–44)
AST: 13 U/L — ABNORMAL LOW (ref 15–41)
Albumin: 3.7 g/dL (ref 3.5–5.0)
Alkaline Phosphatase: 67 U/L (ref 38–126)
Anion gap: 6 (ref 5–15)
BUN: 22 mg/dL — ABNORMAL HIGH (ref 6–20)
CO2: 27 mmol/L (ref 22–32)
Calcium: 8.9 mg/dL (ref 8.9–10.3)
Chloride: 106 mmol/L (ref 98–111)
Creatinine: 0.81 mg/dL (ref 0.44–1.00)
GFR, Estimated: >60 mL/min (ref >60)
Glucose, Bld: 92 mg/dL (ref 70–99)
Potassium: 4.4 mmol/L (ref 3.5–5.1)
Sodium: 139 mmol/L (ref 135–145)
Total Bilirubin: 0.2 mg/dL — ABNORMAL LOW (ref 0.3–1.2)
Total Protein: 6.6 g/dL (ref 6.5–8.1)

## 2021-04-08 LAB — CBC WITH DIFFERENTIAL (CANCER CENTER ONLY)
Abs Immature Granulocytes: 0.01 10*3/uL (ref 0.00–0.07)
Basophils Absolute: 0 10*3/uL (ref 0.0–0.1)
Basophils Relative: 0 %
Eosinophils Absolute: 0.1 10*3/uL (ref 0.0–0.5)
Eosinophils Relative: 1 %
HCT: 23.5 % — ABNORMAL LOW (ref 36.0–46.0)
Hemoglobin: 8.4 g/dL — ABNORMAL LOW (ref 12.0–15.0)
Immature Granulocytes: 0 %
Lymphocytes Relative: 61 %
Lymphs Abs: 2.3 10*3/uL (ref 0.7–4.0)
MCH: 39.1 pg — ABNORMAL HIGH (ref 26.0–34.0)
MCHC: 35.7 g/dL (ref 30.0–36.0)
MCV: 109.3 fL — ABNORMAL HIGH (ref 80.0–100.0)
Monocytes Absolute: 0.3 10*3/uL (ref 0.1–1.0)
Monocytes Relative: 7 %
Neutro Abs: 1.2 10*3/uL — ABNORMAL LOW (ref 1.7–7.7)
Neutrophils Relative %: 31 %
Platelet Count: 51 10*3/uL — ABNORMAL LOW (ref 150–400)
RBC: 2.15 MIL/uL — ABNORMAL LOW (ref 3.87–5.11)
RDW: 14.3 % (ref 11.5–15.5)
WBC Count: 3.8 10*3/uL — ABNORMAL LOW (ref 4.0–10.5)
nRBC: 0 % (ref 0.0–0.2)

## 2021-04-08 MED ORDER — SODIUM CHLORIDE 0.9% FLUSH
10.0000 mL | Freq: Once | INTRAVENOUS | Status: AC
Start: 2021-04-08 — End: 2021-04-08
  Administered 2021-04-08: 10 mL
  Filled 2021-04-08: qty 10

## 2021-04-08 NOTE — Assessment & Plan Note (Signed)
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

## 2021-04-08 NOTE — Assessment & Plan Note (Signed)
Unfortunately, she developed severe pancytopenia again despite recent aggressive dose adjustment We will cancel her treatment today and reschedule for 3 weeks Am concerned about her severe pancytopenia I will prescribe further dose adjustment If she continues to run into problem, she might need a bone marrow aspirate and biopsy to rule out bone marrow infiltration causing severe pancytopenia

## 2021-04-08 NOTE — Progress Notes (Signed)
[place] [facility] OFFICE PROGRESS NOTE  Patient Care Team: Marrian Salvage, [place] as PCP - General (Internal Medicine) Judeth Horn, MD as Consulting Physician (General Surgery) Nat Math, MD as Referring Physician (Obstetrics and Gynecology) Elam Dutch, MD as Consulting Physician (Vascular Surgery) Heath Lark, MD as Consulting Physician (Hematology and Oncology)  ASSESSMENT & PLAN:  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 [facility]) Unfortunately, she developed severe pancytopenia again despite recent aggressive dose adjustment We will cancel her treatment today and reschedule for 3 weeks Am concerned about her severe pancytopenia I will prescribe further dose adjustment If she continues to run into problem, she might need a bone marrow aspirate and biopsy to rule out bone marrow infiltration causing severe pancytopenia  Pancytopenia, acquired ([place]) She has severe pancytopenia Recently, I have order iron studies and B12 and they were within normal range She denies recent alcohol intake Observe closely for now She does not need transfusion support I will delay her next treatment until next month and will prescribe further dose adjustment for carboplatin  Essential hypertension Her blood pressure control is satisfactory Monitor closely  Cancer associated pain She has chronic back pain and 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   All questions were answered. The patient knows to call the clinic with any problems, questions or concerns. The total time spent in the appointment was 30 minutes encounter with patients including review of chart and various tests results, discussions about plan of care and coordination of care plan   Heath Lark, MD 04/08/2021 10:21 AM  INTERVAL HISTORY: Please see below for problem oriented charting. She returns for further follow-up She tolerated last cycle of  treatment well without nausea or changes in bowel habits Chronic pain is stable The patient denies any recent signs or symptoms of bleeding such as spontaneous epistaxis, hematuria or hematochezia. Her documented blood pressure at home were within normal range except for occasional systolic blood pressure around 150  SUMMARY OF ONCOLOGIC HISTORY: Oncology History Overview Note  BRCA1 positive   Cancer of right colon ([place])  09/16/2014 Imaging   CT abd/pel:  a 3.8cm cecal mass with associated ileocecal intussusception, and a 9.1cm solid and cystic mass in the left pelvis,   09/18/2014 Surgery   right hemicolectomy and terminal ileoectomy   09/18/2014 Pathologic Stage   pT3pN1Mx, tumor extends into pericolonic soft tissue and is less than 25m from the serosal surface, LVI 8-), PNI (-), all 23 node negative, one soft tissue tumor deposit, surgical margins negative.    09/18/2014 Initial Diagnosis   Adenocarcinoma of right colon   08/06/2016 Imaging   CT chest/abd/pelvis with contrast IMPRESSION: 1. No recurrent malignancy identified. 2. 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 On the prior exam from 09/13/2015 there was back flow of contrast in the left external iliac artery to supply the left internal iliac artery ; this back flow of contrast is no longer present in the left external iliac artery is occluded. 3. Several pulmonary nodules at or below 4 mm diameter, unchanged. 4. 3 mm hypodensity posteriorly in the body of the pancreas, not well seen previously and subtle today, likely incidental, but merits observation. 5. 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 Prominent degenerative spurring of both acetabula. 6. Lumbar spondylosis and degenerative disc disease causing prominent impingement at L4-5 and mild impingement at L5-S1.  03/08/2017 Imaging   CT CAP W Contrast 03/08/17 IMPRESSION: Chest  Impression: 1. Stable small pulmonary nodules within LEFT lung. 2. No mediastinal lymphadenopathy.  Abdomen / Pelvis Impression: 1. No evidence of metastatic colorectal cancer or ovarian cancer within the abdomen pelvis. 2. No lymphadenopathy. 3. Postsurgical change consistent RIGHT hemicolectomy and hysterectomy 4. A fem-fem arterial bypass noted.   11/13/2017 Imaging   CT AP w contrast IMPRESSION: 1. No findings of recurrent malignancy in the abdomen or pelvis. 2. Other imaging findings of potential clinical significance:Chronic AVN of both femoral heads without contour abnormality. Lumbar impingement at L4-5 and L5-S1. Aortic Atherosclerosis (ICD10-I70.0). 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 Partial right hemicolectomy. Hysterectomy.   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 ([place])  10/09/2014 Imaging   CT of the abdomen and pelvis showed a 9.1 cm solid and cystic mass in the left pelvis concerning for malignancy. This was discovered during her workup for colon cancer.   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 Tumor size 3.7 cm, no lymph nodes removed. T1cNx. Tumor cells are positive for cytokeratin 7 and estrogen receptor.   11/01/2014 Surgery   B/l Salpingo-oophorectomy.   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 Stable small pulmonary nodules within LEFT lung. 2. No mediastinal lymphadenopathy. Abdomen / Pelvis Impression: 1. No evidence of metastatic colorectal cancer or ovarian cancer within the abdomen pelvis. 2. No lymphadenopathy. 3. Postsurgical  change consistent RIGHT hemicolectomy and hysterectomy 4. A fem-fem arterial bypass noted.   03/12/2017 Mammogram   MM Right Breast 03/12/17 IMPRESSION: No mammographic or sonographic evidence of malignancy. No correlate identified for the abnormal enhancement seen within the right nipple on MRI of 02/27/2017.    04/02/2017 Pathology Results   Surgical Pathology of Nipple Biopsy: 04/02/17 Diagnosis Nipple Biopsy, Right - BENIGN NIPPLE TISSUE. - NO ATYPIA OR MALIGNANCY.    11/13/2017 Imaging   1. No findings of recurrent malignancy in the abdomen or pelvis. 2. Other imaging findings of potential clinical significance: Chronic AVN of both femoral heads without contour abnormality. Lumbar impingement at L4-5 and L5-S1. Aortic Atherosclerosis (ICD10-I70.0). 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 Partial right hemicolectomy. Hysterectomy.   03/22/2018 Mammogram   Screening mammogram IMPRESSION No evidence of malignancy.     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   06/06/2019 Imaging   1. Tiny bilateral pulmonary nodules stable since 11/12/2018. Continued close attention on follow-up recommended. 2. Incompletely visualized upper abdominal lymphadenopathy better characterized on the abdomen CT from 2 weeks ago. 3.  Aortic Atherosclerois (ICD10-170.0) 4.  Emphysema. (JSE83-T51.9)   06/07/2019 Procedure   Technically successful CT guided core needle biopsy of indeterminate peritoneal nodule within the right lower abdomen/pelvis.   06/07/2019 Pathology Results   Soft Tissue Needle Core Biopsy, peritoneal nodule within right lower abdomen - METASTATIC  CARCINOMA CONSISTENT WITH PATIENT'S CLINICAL  HISTORY OF PRIMARY OVARIAN CARCINOMA. SEE NOTE Diagnosis Note Immunohistochemical stains show that the tumor cells are positive for CK7, ER and PAX 8; and negative for CK20 and CDX2. This immunostaining profile is consistent with above diagnosis.   06/16/2019 Tumor Marker   Patient's tumor was tested for the following markers: CA-125 Results of the tumor marker test revealed 499   06/20/2019 - 11/14/2019 Chemotherapy   The patient had carboplatin and taxol   07/11/2019 Tumor Marker   Patient's tumor was tested for the following markers: CA-125 Results of the tumor marker test revealed 438   08/05/2019 Tumor Marker   Patient's tumor was tested for the following markers: CA-125 Results of the tumor marker test revealed 272.   08/25/2019 Imaging   Ct chest, abdomen and pelvis 1. Slight interval decrease in size of a right lower quadrant peritoneal nodule measuring 1.7 x 1.6 cm, previously 2.2 x 2.2 cm (series 2, image 94, series 5, image 58). Findings are consistent with treatment response.   2. No significant change in ill-defined soft tissue about the sigmoid colon in the hysterectomy bed (series 2, image 101).   3.  Status post hysterectomy.   4. No evidence of metastatic disease in the chest. Stable benign small pulmonary nodules.   5.  Coronary artery disease.  Aortic Atherosclerosis (ICD10-I70.0).     08/26/2019 Tumor Marker   Patient's tumor was tested for the following markers: CA-125 Results of the tumor marker test revealed 189.   09/26/2019 Tumor Marker   Patient's tumor was tested for the following markers: CA-125 Results of the tumor marker test revealed 137   10/03/2019 Tumor Marker   Patient's tumor was tested for the following markers: CA-125 Results of the tumor marker test revealed 137.   10/31/2019 Tumor Marker   Patient's tumor was tested for the following markers: CA-125 Results of the tumor marker test revealed 101   11/29/2019 Imaging   1. Right lower  quadrant peritoneal implant is decreased. Stable irregular nodular pelvic peritoneal thickening superior to the vaginal cuff. 2. No new or progressive metastatic disease. No abdominopelvic lymphadenopathy. 3.  Aortic Atherosclerosis (ICD10-I70.0).   12/15/2019 -  Chemotherapy   The patient had Lynparza for chemotherapy treatment.     12/23/2019 Tumor Marker   Patient's tumor was tested for the following markers: CA-125 Results of the tumor marker test revealed 55.9   01/02/2020 Tumor Marker   Patient's tumor was tested for the following markers: CA-125. Results of the tumor marker test revealed 58.6   02/03/2020 Tumor Marker   Patient's tumor was tested for the following markers: CA-125 Results of the tumor marker test revealed 52.2   02/24/2020 Tumor Marker   Patient's tumor was tested for the following markers: CA-125 Results of the tumor marker test revealed 64.8   04/19/2020 Imaging   1. Continued slight interval decrease in size of a soft tissue nodule in the right paracolic gutter, consistent with treatment response. 2. Soft tissue in the hysterectomy bed near the distal sigmoid colon and rectum may be slightly decreased in size, difficult to measure due to configuration but apparently diminished in size, also consistent with treatment response. 3. Status post hysterectomy and oophorectomy as well as right colon resection. 4. No evidence of new metastatic disease in the abdomen or pelvis. 5. Redemonstrated long segment occlusion of the left common, internal, and external iliac arteries. Status post femoral-femoral bypass grafting with reconstitution of the left common femoral artery.  Aortic Atherosclerosis (ICD10-I70.0).   08/15/2020 Tumor Marker   Patient's tumor was tested for the following markers: CA-125 Results of the tumor marker test revealed 47.1   10/22/2020 Imaging   1. Reduced size of the nodule along the right paracolic gutter, currently 0.6 by 1.2 cm, previously 0.7 by  1.4 cm on 04/19/2020 and previously 0.9 by 1.5 cm on 11/29/2019. Appearance compatible with treatment response. 2. Further mild reduction in size of the bandlike density along the rectosigmoid mesentery, probably from scarring or effectively treated tumor. 3. Prominent stool throughout the colon favors constipation.  4. Stable chronic occlusion of the left iliac arteries, with a patent fem-fem bypass. 5. 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 6. Lower lumbar spondylosis and degenerative disc disease causing impingement at L4-5 and L5-S1. 7. Aortic atherosclerosis.     10/23/2020 Tumor Marker   Patient's tumor was tested for the following markers: CA-125 Results of the tumor marker test revealed 74.6   01/21/2021 Tumor Marker   Patient's tumor was tested for the following markers: CA-125 Results of the tumor marker test revealed 825   01/28/2021 Imaging   1. Changes of right hemicolectomy with slightly decreased size of the nodule along the right pericolic gutter and similar band like density along the rectosigmoid mesentery. 2. Two new well-circumscribed low-density nodules in the presacral perirectal space, difficult to ascertain whether they arise from the bowel wall or arise from within the peritoneum. This is a nonspecific finding which could be further evaluated with colonoscopy, repeat CT with rectal contrast or attention on close interval follow-up imaging in 3 months. 3. 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 4. 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 5. Emphysema and aortic atherosclerosis. Aortic Atherosclerosis (ICD10-I70.0) and Emphysema (ICD10-J43.9).   02/08/2021 Procedure   Successful placement of a right internal jugular approach power injectable Port-A-Cath. The catheter is ready for immediate use.     02/11/2021 -  Chemotherapy     Patient is on Treatment Plan: OVARIAN RECURRENT 3RD LINE CARBOPLATIN D1 / GEMCITABINE D1,8 (4/800) Q21D WITH BEVACIZUMAB      02/18/2021 Tumor Marker   Patient's tumor was tested for the following markers: CA-125 Results of the tumor marker test revealed 627   03/04/2021 Tumor Marker   Patient's tumor was tested for the following markers: CA-125. Results of the tumor marker test revealed 541   03/11/2021 Tumor Marker   Patient's tumor was tested for the following markers: CA-125 Results of the tumor marker test revealed 568   03/25/2021 Tumor Marker   Patient's tumor was tested for the following markers:CA-125 Results of the tumor marker test revealed 367.     REVIEW OF SYSTEMS:   Constitutional: Denies fevers, chills or abnormal weight loss Eyes: Denies blurriness of vision Ears, nose, mouth, throat, and face: Denies mucositis or sore throat Respiratory: Denies cough, dyspnea or wheezes Cardiovascular: Denies palpitation, chest discomfort or lower extremity swelling Gastrointestinal:  Denies nausea, heartburn or change in bowel habits Skin: Denies abnormal skin rashes Lymphatics: Denies new lymphadenopathy or easy bruising Neurological:Denies numbness, tingling or new weaknesses Behavioral/Psych: Mood is stable, no new changes  All other systems were reviewed with the patient and are negative.  I have reviewed the past medical history, past surgical history, social history and family history with the patient and they are unchanged from previous note.  ALLERGIES:  is allergic to  other, demerol [meperidine], morphine and related, oxycodone, penicillins, strawberry flavor, tylenol [acetaminophen], and ibuprofen.  MEDICATIONS:  Current Outpatient Medications  Medication Sig Dispense Refill  . acetaminophen (TYLENOL) 325 MG tablet Take 325 mg by mouth as needed.    [name] amLODipine (NORVASC) 10 MG tablet Take 1 tablet (10 mg total) by mouth daily. 30 tablet 3  . docusate sodium 100 MG  CAPS Take 100 mg by mouth 2 (two) times daily as needed for mild constipation. 10 capsule 0  . HYDROcodone-acetaminophen (NORCO/VICODIN) 5-325 MG tablet Take 1 tablet by mouth every 6 (six) hours as needed for moderate pain. 90 tablet 0  . lidocaine (LIDODERM) 5 % PLACE 1 PATCH ONTO THE SKIN EVERY DAY REMOVE AND DISCARD WITHIN 12 HOURS OR AS DIRECTED BY PRESCRIBER 90 patch 1  . lidocaine-prilocaine (EMLA) cream Apply to affected area once 30 g 3  . losartan (COZAAR) 50 MG tablet Take 1 tablet (50 mg total) by mouth daily. 90 tablet 3  . ondansetron (ZOFRAN) 8 MG tablet Take 1 tablet (8 mg total) by mouth every 8 (eight) hours as needed. Start on the third day after chemotherapy. 30 tablet 1  . pregabalin (LYRICA) 200 MG capsule Take 1 capsule (200 mg total) by mouth every 8 (eight) hours. Please give patient brand name Lyrica 270 capsule 4  . prochlorperazine (COMPAZINE) 10 MG tablet Take 1 tablet (10 mg total) by mouth every 6 (six) hours as needed (Nausea or vomiting). 30 tablet 1   No current facility-administered medications for this visit.    PHYSICAL EXAMINATION: ECOG PERFORMANCE STATUS: 1 - Symptomatic but completely ambulatory  Vitals:   04/08/21 0910  BP: 133/69  Pulse: 67  Resp: 18  Temp: 97.7 F (36.5 C)  SpO2: 99%   Filed Weights   04/08/21 0910  Weight: 136 lb 3.2 oz (61.8 kg)    GENERAL:alert, no distress and comfortable SKIN: skin color, texture, turgor are normal, no rashes or significant lesions EYES: normal, Conjunctiva are pink and non-injected, sclera clear OROPHARYNX:no exudate, no erythema and lips, buccal mucosa, and tongue normal  NECK: supple, thyroid normal size, non-tender, without nodularity LYMPH:  no palpable lymphadenopathy in the cervical, axillary or inguinal LUNGS: clear to auscultation and percussion with normal breathing effort HEART: regular rate & rhythm and no murmurs and no lower extremity edema ABDOMEN:abdomen soft, non-tender and normal  bowel sounds Musculoskeletal:no cyanosis of digits and no clubbing  NEURO: alert & oriented x 3 with fluent speech, no focal motor/sensory deficits  LABORATORY DATA:  I have reviewed the data as listed    Component Value Date/Time   NA 139 04/08/2021 0848   NA 141 10/07/2017 1436   K 4.4 04/08/2021 0848   K 4.4 10/07/2017 1436   CL 106 04/08/2021 0848   CO2 27 04/08/2021 0848   CO2 26 10/07/2017 1436   GLUCOSE 92 04/08/2021 0848   GLUCOSE 85 10/07/2017 1436   BUN 22 (H) 04/08/2021 0848   BUN 14.9 10/07/2017 1436   CREATININE 0.81 04/08/2021 0848   CREATININE 1.0 10/07/2017 1436   CALCIUM 8.9 04/08/2021 0848   CALCIUM 9.5 10/07/2017 1436   PROT 6.6 04/08/2021 0848   PROT 7.8 10/07/2017 1436   ALBUMIN 3.7 04/08/2021 0848   ALBUMIN 3.9 10/07/2017 1436   AST 13 (L) 04/08/2021 0848   AST 15 10/07/2017 1436   ALT <6 04/08/2021 0848   ALT 7 10/07/2017 1436   ALKPHOS 67 04/08/2021 0848   ALKPHOS 81 10/07/2017 1436  BILITOT 0.2 (L) 04/08/2021 0848   BILITOT 0.54 10/07/2017 1436   GFRNONAA >60 04/08/2021 0848   GFRAA >60 08/15/2020 1121    No results found for: SPEP, UPEP  Lab Results  Component Value Date   WBC 3.8 (L) 04/08/2021   NEUTROABS 1.2 (L) 04/08/2021   HGB 8.4 (L) 04/08/2021   HCT 23.5 (L) 04/08/2021   MCV 109.3 (H) 04/08/2021   PLT 51 (L) 04/08/2021      Chemistry      Component Value Date/Time   NA 139 04/08/2021 0848   NA 141 10/07/2017 1436   K 4.4 04/08/2021 0848   K 4.4 10/07/2017 1436   CL 106 04/08/2021 0848   CO2 27 04/08/2021 0848   CO2 26 10/07/2017 1436   BUN 22 (H) 04/08/2021 0848   BUN 14.9 10/07/2017 1436   CREATININE 0.81 04/08/2021 0848   CREATININE 1.0 10/07/2017 1436      Component Value Date/Time   CALCIUM 8.9 04/08/2021 0848   CALCIUM 9.5 10/07/2017 1436   ALKPHOS 67 04/08/2021 0848   ALKPHOS 81 10/07/2017 1436   AST 13 (L) 04/08/2021 0848   AST 15 10/07/2017 1436   ALT <6 04/08/2021 0848   ALT 7 10/07/2017 1436    BILITOT 0.2 (L) 04/08/2021 0848   BILITOT 0.54 10/07/2017 1436       RADIOGRAPHIC STUDIES: I have personally reviewed the radiological images as listed and agreed with the findings in the report. VAS [place] ABI WITH/WO TBI  Result Date: 03/21/2021  LOWER EXTREMITY DOPPLER STUDY Patient Name:  MARIALICE NEWKIRK  Date of Exam:   03/21/2021 Medical Rec #: [id number]        Accession #:    [id number] Date of Birth: 27-Feb-1961        Patient Gender: F Patient Age:   60Y Exam Location:  [facility] Procedure:      VAS [place] ABI WITH/WO TBI Referring Phys: [id number] Karoline Caldwell --------------------------------------------------------------------------------   Vascular Interventions: 04/18/2015: Right to left femoral-femoral bypass, right                         common and superficial                         femoral endarterectomy. Performing Technologist: Ralene Cork RVT  Examination Guidelines: A complete evaluation includes at minimum, Doppler waveform signals and systolic blood pressure reading at the level of bilateral brachial, anterior tibial, and posterior tibial arteries, when vessel segments are accessible. Bilateral testing is considered an integral part of a complete examination. Photoelectric Plethysmograph (PPG) waveforms and toe systolic pressure readings are included as required and additional duplex testing as needed. Limited examinations for reoccurring indications may be performed as noted.  ABI Findings: +---------+------------------+-----+----------+--------+ Right    Rt Pressure (mmHg)IndexWaveform  Comment  +---------+------------------+-----+----------+--------+ Brachial 146                                       +---------+------------------+-----+----------+--------+ PTA      93                0.61 monophasic         +---------+------------------+-----+----------+--------+ DP       70                0.46 monophasic          +---------+------------------+-----+----------+--------+  Great Toe66                0.43                    +---------+------------------+-----+----------+--------+ +---------+------------------+-----+----------+-------+ Left     Lt Pressure (mmHg)IndexWaveform  Comment +---------+------------------+-----+----------+-------+ Brachial 153                                      +---------+------------------+-----+----------+-------+ PTA      67                0.44 monophasic        +---------+------------------+-----+----------+-------+ DP       66                0.43 monophasic        +---------+------------------+-----+----------+-------+ Great Toe64                0.42                   +---------+------------------+-----+----------+-------+ +-------+-----------+-----------+------------+------------+ ABI/TBIToday's ABIToday's TBIPrevious ABIPrevious TBI +-------+-----------+-----------+------------+------------+ Right  0.61       0.43       0.58        0.41         +-------+-----------+-----------+------------+------------+ Left   0.44       0.42       0.46        0.0          +-------+-----------+-----------+------------+------------+  Previous ABI on 12/21/20.  Summary: Right: Resting right ankle-brachial index indicates moderate right lower extremity arterial disease. The right toe-brachial index is abnormal. RT great toe pressure = 66 mmHg. Left: Resting left ankle-brachial index indicates severe left lower extremity arterial disease. The left toe-brachial index is abnormal. LT Great toe pressure = 64 mmHg.  *See table(s) above for measurements and observations.  Electronically signed by Ruta Hinds MD on 03/21/2021 at 2:28:12 PM.    Final

## 2021-04-08 NOTE — Assessment & Plan Note (Signed)
Her blood pressure control is satisfactory Monitor closely

## 2021-04-08 NOTE — Assessment & Plan Note (Signed)
She has severe pancytopenia Recently, I have order iron studies and B12 and they were within normal range She denies recent alcohol intake Observe closely for now She does not need transfusion support I will delay her next treatment until next month and will prescribe further dose adjustment for carboplatin

## 2021-04-09 LAB — CA 125: Cancer Antigen (CA) 125: 273 U/mL — ABNORMAL HIGH (ref 0.0–38.1)

## 2021-04-15 ENCOUNTER — Telehealth: Payer: Self-pay | Admitting: Hematology and Oncology

## 2021-04-15 NOTE — Telephone Encounter (Signed)
Schedule appointment per 05/16 schedule message. Left a detailed message.

## 2021-04-23 ENCOUNTER — Telehealth: Payer: Self-pay

## 2021-04-23 ENCOUNTER — Other Ambulatory Visit: Payer: Self-pay | Admitting: Hematology and Oncology

## 2021-04-23 MED ORDER — HYDROCODONE-ACETAMINOPHEN 5-325 MG PO TABS: 1.0000 | ORAL_TABLET | Freq: Four times a day (QID) | ORAL | 0 refills | Status: DC | PRN

## 2021-04-23 NOTE — Telephone Encounter (Signed)
done

## 2021-04-23 NOTE — Telephone Encounter (Signed)
Patient notified

## 2021-04-23 NOTE — Telephone Encounter (Signed)
Patient called requesting refill on Hydrocodone-acetaminophen 5/325mg .   Last Rx on 4/29.   [facility] is correct pharmacy.

## 2021-04-29 ENCOUNTER — Inpatient Hospital Stay (HOSPITAL_BASED_OUTPATIENT_CLINIC_OR_DEPARTMENT_OTHER): Payer: 59 | Admitting: Hematology and Oncology

## 2021-04-29 ENCOUNTER — Other Ambulatory Visit: Payer: Self-pay

## 2021-04-29 ENCOUNTER — Inpatient Hospital Stay: Payer: 59

## 2021-04-29 ENCOUNTER — Encounter: Payer: Self-pay | Admitting: Hematology and Oncology

## 2021-04-29 ENCOUNTER — Inpatient Hospital Stay: Payer: 59 | Attending: Hematology and Oncology

## 2021-04-29 VITALS — BP 145/81 | HR 58 | Temp 97.7°F | Resp 16

## 2021-04-29 DIAGNOSIS — Z85038 Personal history of other malignant neoplasm of large intestine: Secondary | ICD-10-CM | POA: Diagnosis not present

## 2021-04-29 DIAGNOSIS — C562 Malignant neoplasm of left ovary: Secondary | ICD-10-CM | POA: Diagnosis present

## 2021-04-29 DIAGNOSIS — Z79899 Other long term (current) drug therapy: Secondary | ICD-10-CM | POA: Insufficient documentation

## 2021-04-29 DIAGNOSIS — G893 Neoplasm related pain (acute) (chronic): Secondary | ICD-10-CM | POA: Diagnosis not present

## 2021-04-29 DIAGNOSIS — D61818 Other pancytopenia: Secondary | ICD-10-CM | POA: Insufficient documentation

## 2021-04-29 DIAGNOSIS — I10 Essential (primary) hypertension: Secondary | ICD-10-CM | POA: Insufficient documentation

## 2021-04-29 DIAGNOSIS — Z5112 Encounter for antineoplastic immunotherapy: Secondary | ICD-10-CM | POA: Diagnosis present

## 2021-04-29 DIAGNOSIS — G609 Hereditary and idiopathic neuropathy, unspecified: Secondary | ICD-10-CM | POA: Diagnosis not present

## 2021-04-29 DIAGNOSIS — Z5111 Encounter for antineoplastic chemotherapy: Secondary | ICD-10-CM | POA: Diagnosis present

## 2021-04-29 DIAGNOSIS — C182 Malignant neoplasm of ascending colon: Secondary | ICD-10-CM

## 2021-04-29 DIAGNOSIS — Z90710 Acquired absence of both cervix and uterus: Secondary | ICD-10-CM | POA: Diagnosis not present

## 2021-04-29 DIAGNOSIS — Z9221 Personal history of antineoplastic chemotherapy: Secondary | ICD-10-CM | POA: Diagnosis not present

## 2021-04-29 LAB — CBC WITH DIFFERENTIAL (CANCER CENTER ONLY)
Abs Immature Granulocytes: 0.01 10*3/uL (ref 0.00–0.07)
Basophils Absolute: 0 10*3/uL (ref 0.0–0.1)
Basophils Relative: 1 %
Eosinophils Absolute: 0.1 10*3/uL (ref 0.0–0.5)
Eosinophils Relative: 1 %
HCT: 34.2 % — ABNORMAL LOW (ref 36.0–46.0)
Hemoglobin: 11.8 g/dL — ABNORMAL LOW (ref 12.0–15.0)
Immature Granulocytes: 0 %
Lymphocytes Relative: 38 %
Lymphs Abs: 2.2 10*3/uL (ref 0.7–4.0)
MCH: 37.8 pg — ABNORMAL HIGH (ref 26.0–34.0)
MCHC: 34.5 g/dL (ref 30.0–36.0)
MCV: 109.6 fL — ABNORMAL HIGH (ref 80.0–100.0)
Monocytes Absolute: 0.5 10*3/uL (ref 0.1–1.0)
Monocytes Relative: 8 %
Neutro Abs: 3.1 10*3/uL (ref 1.7–7.7)
Neutrophils Relative %: 52 %
Platelet Count: 139 10*3/uL — ABNORMAL LOW (ref 150–400)
RBC: 3.12 MIL/uL — ABNORMAL LOW (ref 3.87–5.11)
RDW: 14.1 % (ref 11.5–15.5)
WBC Count: 5.8 10*3/uL (ref 4.0–10.5)
nRBC: 0 % (ref 0.0–0.2)

## 2021-04-29 LAB — CMP (CANCER CENTER ONLY)
ALT: 11 U/L (ref 0–44)
AST: 19 U/L (ref 15–41)
Albumin: 3.9 g/dL (ref 3.5–5.0)
Alkaline Phosphatase: 59 U/L (ref 38–126)
Anion gap: 11 (ref 5–15)
BUN: 15 mg/dL (ref 6–20)
CO2: 25 mmol/L (ref 22–32)
Calcium: 9.4 mg/dL (ref 8.9–10.3)
Chloride: 106 mmol/L (ref 98–111)
Creatinine: 0.76 mg/dL (ref 0.44–1.00)
GFR, Estimated: >60 mL/min (ref >60)
Glucose, Bld: 95 mg/dL (ref 70–99)
Potassium: 4.3 mmol/L (ref 3.5–5.1)
Sodium: 142 mmol/L (ref 135–145)
Total Bilirubin: 0.4 mg/dL (ref 0.3–1.2)
Total Protein: 7.2 g/dL (ref 6.5–8.1)

## 2021-04-29 LAB — TOTAL PROTEIN, URINE DIPSTICK: Protein, ur: NEGATIVE mg/dL

## 2021-04-29 MED ORDER — SODIUM CHLORIDE 0.9 % IV SOLN
294.0000 mg | Freq: Once | INTRAVENOUS | Status: AC
  Administered 2021-04-29: 290 mg via INTRAVENOUS
  Filled 2021-04-29: qty 29

## 2021-04-29 MED ORDER — FAMOTIDINE 20 MG IN NS 100 ML IVPB
20.0000 mg | Freq: Once | INTRAVENOUS | Status: AC
  Administered 2021-04-29: 20 mg via INTRAVENOUS

## 2021-04-29 MED ORDER — SODIUM CHLORIDE 0.9 % IV SOLN
15.0000 mg/kg | Freq: Once | INTRAVENOUS | Status: AC
  Administered 2021-04-29: 900 mg via INTRAVENOUS
  Filled 2021-04-29: qty 32

## 2021-04-29 MED ORDER — SODIUM CHLORIDE 0.9 % IV SOLN
240.0000 mg/m2 | Freq: Once | INTRAVENOUS | Status: AC
  Administered 2021-04-29: 418 mg via INTRAVENOUS
  Filled 2021-04-29: qty 10.99

## 2021-04-29 MED ORDER — DIPHENHYDRAMINE HCL 50 MG/ML IJ SOLN
25.0000 mg | Freq: Once | INTRAMUSCULAR | Status: AC
  Administered 2021-04-29: 25 mg via INTRAVENOUS

## 2021-04-29 MED ORDER — FAMOTIDINE 20 MG IN NS 100 ML IVPB
INTRAVENOUS | Status: AC
  Filled 2021-04-29: qty 100

## 2021-04-29 MED ORDER — SODIUM CHLORIDE 0.9 % IV SOLN
150.0000 mg | Freq: Once | INTRAVENOUS | Status: AC
  Administered 2021-04-29: 150 mg via INTRAVENOUS
  Filled 2021-04-29: qty 5
  Filled 2021-04-29: qty 150

## 2021-04-29 MED ORDER — PALONOSETRON HCL INJECTION 0.25 MG/5ML
0.2500 mg | Freq: Once | INTRAVENOUS | Status: AC
Start: 2021-04-29 — End: 2021-04-29
  Administered 2021-04-29: 0.25 mg via INTRAVENOUS

## 2021-04-29 MED ORDER — SODIUM CHLORIDE 0.9% FLUSH
10.0000 mL | INTRAVENOUS | Status: DC | PRN
  Administered 2021-04-29: 10 mL
  Filled 2021-04-29: qty 10

## 2021-04-29 MED ORDER — SODIUM CHLORIDE 0.9 % IV SOLN
10.0000 mg | Freq: Once | INTRAVENOUS | Status: AC
  Administered 2021-04-29: 10 mg via INTRAVENOUS
  Filled 2021-04-29: qty 1
  Filled 2021-04-29: qty 10

## 2021-04-29 MED ORDER — SODIUM CHLORIDE 0.9% FLUSH
10.0000 mL | Freq: Once | INTRAVENOUS | Status: AC
  Administered 2021-04-29: 10 mL
  Filled 2021-04-29: qty 10

## 2021-04-29 MED ORDER — HEPARIN SOD (PORK) LOCK FLUSH 100 UNIT/ML IV SOLN
500.0000 [IU] | Freq: Once | INTRAVENOUS | Status: AC | PRN
Start: 2021-04-29 — End: 2021-04-29
  Administered 2021-04-29: 500 [IU]
  Filled 2021-04-29: qty 5

## 2021-04-29 MED ORDER — DIPHENHYDRAMINE HCL 50 MG/ML IJ SOLN
INTRAMUSCULAR | Status: AC
  Filled 2021-04-29: qty 1

## 2021-04-29 MED ORDER — SODIUM CHLORIDE 0.9 % IV SOLN
Freq: Once | INTRAVENOUS | Status: AC
  Filled 2021-04-29: qty 250

## 2021-04-29 MED ORDER — PALONOSETRON HCL INJECTION 0.25 MG/5ML
INTRAVENOUS | Status: AC
  Filled 2021-04-29: qty 5

## 2021-04-29 NOTE — Assessment & Plan Note (Signed)
Her pancytopenia has almost completely resolved We will proceed with treatment as above with dosage adjustment

## 2021-04-29 NOTE — Patient Instructions (Signed)
[facility]    Discharge Instructions: Thank you for choosing [facility] to provide your oncology and hematology care.   If you have a lab appointment with the [facility], please go directly to the [facility] and check in at the registration area.   Wear comfortable clothing and clothing appropriate for easy access to any Portacath or PICC line.   We strive to give you quality time with your provider. You may need to reschedule your appointment if you arrive late (15 or more minutes).  Arriving late affects you and other patients whose appointments are after yours.  Also, if you miss three or more appointments without notifying the office, you may be dismissed from the clinic at the provider's discretion.      For prescription refill requests, have your pharmacy contact our office and allow 72 hours for refills to be completed.    Today you received the following chemotherapy and/or immunotherapy agents: bevacizumab, gemcitabine, and carboplatin.     To help prevent nausea and vomiting after your treatment, we encourage you to take your nausea medication as directed.  BELOW ARE SYMPTOMS THAT SHOULD BE REPORTED IMMEDIATELY: . *FEVER GREATER THAN 100.4 F (38 C) OR HIGHER . *CHILLS OR SWEATING . *NAUSEA AND VOMITING THAT IS NOT CONTROLLED WITH YOUR NAUSEA MEDICATION . *UNUSUAL SHORTNESS OF BREATH . *UNUSUAL BRUISING OR BLEEDING . *URINARY PROBLEMS (pain or burning when urinating, or frequent urination) . *BOWEL PROBLEMS (unusual diarrhea, constipation, pain near the anus) . TENDERNESS IN MOUTH AND THROAT WITH OR WITHOUT PRESENCE OF ULCERS (sore throat, sores in mouth, or a toothache) . UNUSUAL RASH, SWELLING OR PAIN  . UNUSUAL VAGINAL DISCHARGE OR ITCHING   Items with * indicate a potential emergency and should be followed up as soon as possible or go to the Emergency Department if any problems should occur.  Please show the  CHEMOTHERAPY ALERT CARD or IMMUNOTHERAPY ALERT CARD at check-in to the Emergency Department and triage nurse.  Should you have questions after your visit or need to cancel or reschedule your appointment, please contact [facility]  Dept: [phone]  and follow the prompts.  Office hours are 8:00 a.m. to 4:30 p.m. Monday - Friday. Please note that voicemails left after 4:00 p.m. may not be returned until the following business day.  We are closed weekends and major holidays. You have access to a nurse at all times for urgent questions. Please call the main number to the clinic Dept: [phone] and follow the prompts.   For any non-urgent questions, you may also contact your provider using MyChart. We now offer e-Visits for anyone 96 and older to request care online for non-urgent symptoms. For details visit [email or web address].   Also download the MyChart app! Go to the app store, search "MyChart", open the app, select [facility], and log in with your MyChart username and password.  Due to Covid, a mask is required upon entering the hospital/clinic. If you do not have a mask, one will be given to you upon arrival. For doctor visits, patients may have 1 support person aged 17 or older with them. For treatment visits, patients cannot have anyone with them due to current Covid guidelines and our immunocompromised population.

## 2021-04-29 NOTE — Assessment & Plan Note (Signed)
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

## 2021-04-29 NOTE — Progress Notes (Signed)
[facility] OFFICE PROGRESS NOTE  Patient Care Team: Marrian Salvage, [place] as PCP - General (Internal Medicine) Judeth Horn, MD as Consulting Physician (General Surgery) Nat Math, MD as Referring Physician (Obstetrics and Gynecology) Elam Dutch, MD as Consulting Physician (Vascular Surgery) Heath Lark, MD as Consulting Physician (Hematology and Oncology)  ASSESSMENT & PLAN:  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 [facility]) She had prolonged pancytopenia requiring significant dose adjustment and prolonged interval between 1 treatment to another A tumor marker appears to be responding She feels great I recommend resumption of treatment today but I plan to modify her chemotherapy in the future She will receive all treatment: Carboplatin, gemcitabine and bevacizumab with reduced doses all in 1 day, space out every 4 weeks In 4 weeks time, we will be running into public holiday She wants to keep all her appointment on Mondays and she will return the week after 4 July for cycle 4 After cycle 4, I plan to repeat imaging study for objective assessment of response to treatment  Essential hypertension Her blood pressure is elevated today but at home, they were within normal range We will proceed with treatment without delay  Cancer associated pain She has chronic back pain and severe peripheral neuropathy from prior treatment I have refilled her prescription recently She will continue the same   Pancytopenia, acquired [facility]) Her pancytopenia has almost completely resolved We will proceed with treatment as above with dosage adjustment   No orders of the defined types were placed in this encounter.   All questions were answered. The patient knows to call the clinic with any problems, questions or concerns. The total time spent in the appointment was 30 minutes encounter with patients including review of chart and various tests results, discussions about plan of care and  coordination of care plan   Heath Lark, MD 04/29/2021 1:26 PM  INTERVAL HISTORY: Please see below for problem oriented charting. She returns for chemotherapy and follow-up Since last time I saw her, she is getting stronger She denies worsening pain Appetite has improved The patient denies any recent signs or symptoms of bleeding such as spontaneous epistaxis, hematuria or hematochezia. No recent infection, fever or chills  SUMMARY OF ONCOLOGIC HISTORY: Oncology History Overview Note  BRCA1 positive   Cancer of right colon ([place])  09/16/2014 Imaging   CT abd/pel:  a 3.8cm cecal mass with associated ileocecal intussusception, and a 9.1cm solid and cystic mass in the left pelvis,   09/18/2014 Surgery   right hemicolectomy and terminal ileoectomy   09/18/2014 Pathologic Stage   pT3pN1Mx, tumor extends into pericolonic soft tissue and is less than 25m from the serosal surface, LVI 8-), PNI (-), all 23 node negative, one soft tissue tumor deposit, surgical margins negative.    09/18/2014 Initial Diagnosis   Adenocarcinoma of right colon   08/06/2016 Imaging   CT chest/abd/pelvis with contrast IMPRESSION: 1. No recurrent malignancy identified. 2. 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 On the prior exam from 09/13/2015 there was back flow of contrast in the left external iliac artery to supply the left internal iliac artery ; this back flow of contrast is no longer present in the left external iliac artery is occluded. 3. Several pulmonary nodules at or below 4 mm diameter, unchanged. 4. 3 mm hypodensity posteriorly in the body of the pancreas, not well seen previously and subtle today, likely incidental, but merits observation. 5. 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 Prominent degenerative spurring of both acetabula. 6. Lumbar spondylosis and degenerative disc disease causing prominent  impingement at L4-5 and mild impingement at L5-S1.   03/08/2017 Imaging   CT CAP W Contrast 03/08/17 IMPRESSION: Chest Impression: 1. Stable small pulmonary nodules within LEFT lung. 2. No mediastinal lymphadenopathy.  Abdomen / Pelvis Impression: 1. No evidence of metastatic colorectal cancer or ovarian cancer within the abdomen pelvis. 2. No lymphadenopathy. 3. Postsurgical change consistent RIGHT hemicolectomy and hysterectomy 4. A fem-fem arterial bypass noted.   11/13/2017 Imaging   CT AP w contrast IMPRESSION: 1. No findings of recurrent malignancy in the abdomen or pelvis. 2. Other imaging findings of potential clinical significance:Chronic AVN of both femoral heads without contour abnormality. Lumbar impingement at L4-5 and L5-S1. Aortic Atherosclerosis (ICD10-I70.0). 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 Partial right hemicolectomy. Hysterectomy.   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 This was discovered during her workup for colon cancer.   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 Tumor size 3.7 cm, no lymph nodes removed. T1cNx. Tumor cells are positive for cytokeratin 7 and estrogen receptor.   11/01/2014 Surgery   B/l Salpingo-oophorectomy.   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 Stable small pulmonary nodules within LEFT lung. 2. No mediastinal lymphadenopathy. Abdomen / Pelvis Impression: 1. No evidence  of metastatic colorectal cancer or ovarian cancer within the abdomen pelvis. 2. No lymphadenopathy. 3. Postsurgical change consistent RIGHT hemicolectomy and hysterectomy 4. A fem-fem arterial bypass noted.   03/12/2017 Mammogram   MM Right Breast 03/12/17 IMPRESSION: No mammographic or sonographic evidence of malignancy. No correlate identified for the abnormal enhancement seen within the right nipple on MRI of 02/27/2017.    04/02/2017 Pathology Results   Surgical Pathology of Nipple Biopsy: 04/02/17 Diagnosis Nipple Biopsy, Right - BENIGN NIPPLE TISSUE. - NO ATYPIA OR MALIGNANCY.    11/13/2017 Imaging   1. No findings of recurrent malignancy in the abdomen or pelvis. 2. Other imaging findings of potential clinical significance: Chronic AVN of both femoral heads without contour abnormality. Lumbar impingement at L4-5 and L5-S1. Aortic Atherosclerosis (ICD10-I70.0). 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 Partial right hemicolectomy. Hysterectomy.   03/22/2018 Mammogram   Screening mammogram IMPRESSION No evidence of malignancy.     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   06/06/2019 Imaging   1. Tiny bilateral pulmonary nodules stable since 11/12/2018. Continued close attention on follow-up recommended. 2. Incompletely visualized upper abdominal lymphadenopathy better characterized on the abdomen CT from 2 weeks ago. 3.  Aortic Atherosclerois (ICD10-170.0) 4.  Emphysema. ([id number])   06/07/2019 Procedure   Technically successful CT guided core  needle biopsy of indeterminate peritoneal nodule within the right lower abdomen/pelvis.   06/07/2019 Pathology Results   Soft Tissue  Needle Core Biopsy, peritoneal nodule within right lower abdomen - METASTATIC CARCINOMA CONSISTENT WITH PATIENT'S CLINICAL HISTORY OF PRIMARY OVARIAN CARCINOMA. SEE NOTE Diagnosis Note Immunohistochemical stains show that the tumor cells are positive for CK7, ER and PAX 8; and negative for CK20 and CDX2. This immunostaining profile is consistent with above diagnosis.   06/16/2019 Tumor Marker   Patient's tumor was tested for the following markers: CA-125 Results of the tumor marker test revealed 499   06/20/2019 - 11/14/2019 Chemotherapy   The patient had carboplatin and taxol   07/11/2019 Tumor Marker   Patient's tumor was tested for the following markers: CA-125 Results of the tumor marker test revealed 438   08/05/2019 Tumor Marker   Patient's tumor was tested for the following markers: CA-125 Results of the tumor marker test revealed 272.   08/25/2019 Imaging   Ct chest, abdomen and pelvis 1. Slight interval decrease in size of a right lower quadrant peritoneal nodule measuring 1.7 x 1.6 cm, previously 2.2 x 2.2 cm (series 2, image 94, series 5, image 58). Findings are consistent with treatment response.   2. No significant change in ill-defined soft tissue about the sigmoid colon in the hysterectomy bed (series 2, image 101).   3.  Status post hysterectomy.   4. No evidence of metastatic disease in the chest. Stable benign small pulmonary nodules.   5.  Coronary artery disease.  Aortic Atherosclerosis (ICD10-I70.0).     08/26/2019 Tumor Marker   Patient's tumor was tested for the following markers: CA-125 Results of the tumor marker test revealed 189.   09/26/2019 Tumor Marker   Patient's tumor was tested for the following markers: CA-125 Results of the tumor marker test revealed 137   10/03/2019 Tumor Marker   Patient's tumor was tested for the following markers: CA-125 Results of the tumor marker test revealed 137.   10/31/2019 Tumor Marker   Patient's tumor was tested  for the following markers: CA-125 Results of the tumor marker test revealed 101   11/29/2019 Imaging   1. Right lower quadrant peritoneal implant is decreased. Stable irregular nodular pelvic peritoneal thickening superior to the vaginal cuff. 2. No new or progressive metastatic disease. No abdominopelvic lymphadenopathy. 3.  Aortic Atherosclerosis (ICD10-I70.0).   12/15/2019 -  Chemotherapy   The patient had Lynparza for chemotherapy treatment.     12/23/2019 Tumor Marker   Patient's tumor was tested for the following markers: CA-125 Results of the tumor marker test revealed 55.9   01/02/2020 Tumor Marker   Patient's tumor was tested for the following markers: CA-125. Results of the tumor marker test revealed 58.6   02/03/2020 Tumor Marker   Patient's tumor was tested for the following markers: CA-125 Results of the tumor marker test revealed 52.2   02/24/2020 Tumor Marker   Patient's tumor was tested for the following markers: CA-125 Results of the tumor marker test revealed 64.8   04/19/2020 Imaging   1. Continued slight interval decrease in size of a soft tissue nodule in the right paracolic gutter, consistent with treatment response. 2. Soft tissue in the hysterectomy bed near the distal sigmoid colon and rectum may be slightly decreased in size, difficult to measure due to configuration but apparently diminished in size, also consistent with treatment response. 3. Status post hysterectomy and oophorectomy as well as right colon resection. 4. No evidence of new metastatic disease in  the abdomen or pelvis. 5. Redemonstrated long segment occlusion of the left common, internal, and external iliac arteries. Status post femoral-femoral bypass grafting with reconstitution of the left common femoral artery. Aortic Atherosclerosis (ICD10-I70.0).   08/15/2020 Tumor Marker   Patient's tumor was tested for the following markers: CA-125 Results of the tumor marker test revealed 47.1   10/22/2020  Imaging   1. Reduced size of the nodule along the right paracolic gutter, currently 0.6 by 1.2 cm, previously 0.7 by 1.4 cm on 04/19/2020 and previously 0.9 by 1.5 cm on 11/29/2019. Appearance compatible with treatment response. 2. Further mild reduction in size of the bandlike density along the rectosigmoid mesentery, probably from scarring or effectively treated tumor. 3. Prominent stool throughout the colon favors constipation.  4. Stable chronic occlusion of the left iliac arteries, with a patent fem-fem bypass. 5. 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 6. Lower lumbar spondylosis and degenerative disc disease causing impingement at L4-5 and L5-S1. 7. Aortic atherosclerosis.     10/23/2020 Tumor Marker   Patient's tumor was tested for the following markers: CA-125 Results of the tumor marker test revealed 74.6   01/21/2021 Tumor Marker   Patient's tumor was tested for the following markers: CA-125 Results of the tumor marker test revealed 825   01/28/2021 Imaging   1. Changes of right hemicolectomy with slightly decreased size of the nodule along the right pericolic gutter and similar band like density along the rectosigmoid mesentery. 2. Two new well-circumscribed low-density nodules in the presacral perirectal space, difficult to ascertain whether they arise from the bowel wall or arise from within the peritoneum. This is a nonspecific finding which could be further evaluated with colonoscopy, repeat CT with rectal contrast or attention on close interval follow-up imaging in 3 months. 3. 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 4. 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 5. Emphysema and aortic atherosclerosis. Aortic Atherosclerosis (ICD10-I70.0) and Emphysema (ICD10-J43.9).   02/08/2021 Procedure   Successful placement of a right internal  jugular approach power injectable Port-A-Cath. The catheter is ready for immediate use.     02/11/2021 -  Chemotherapy    Patient is on Treatment Plan: OVARIAN RECURRENT 3RD LINE CARBOPLATIN D1 / GEMCITABINE D1,8 (4/800) Q21D WITH BEVACIZUMAB      02/18/2021 Tumor Marker   Patient's tumor was tested for the following markers: CA-125 Results of the tumor marker test revealed 003   03/04/2021 Tumor Marker   Patient's tumor was tested for the following markers: CA-125. Results of the tumor marker test revealed 541   03/11/2021 Tumor Marker   Patient's tumor was tested for the following markers: CA-125 Results of the tumor marker test revealed 568   03/25/2021 Tumor Marker   Patient's tumor was tested for the following markers:CA-125 Results of the tumor marker test revealed 367.   04/08/2021 Tumor Marker   Patient's tumor was tested for the following markers: CA-125 Results of the tumor marker test revealed 273     REVIEW OF SYSTEMS:   Constitutional: Denies fevers, chills or abnormal weight loss Eyes: Denies blurriness of vision Ears, nose, mouth, throat, and face: Denies mucositis or sore throat Respiratory: Denies cough, dyspnea or wheezes Cardiovascular: Denies palpitation, chest discomfort or lower extremity swelling Gastrointestinal:  Denies nausea, heartburn or change in bowel habits Skin: Denies abnormal skin rashes Lymphatics: Denies new lymphadenopathy or easy bruising Neurological:Denies numbness,  tingling or new weaknesses Behavioral/Psych: Mood is stable, no new changes  All other systems were reviewed with the patient and are negative.  I have reviewed the past medical history, past surgical history, social history and family history with the patient and they are unchanged from previous note.  ALLERGIES:  is allergic to other, demerol [meperidine], morphine and related, oxycodone, penicillins, strawberry flavor, tylenol [acetaminophen], and ibuprofen.  MEDICATIONS:   Current Outpatient Medications  Medication Sig Dispense Refill  . acetaminophen (TYLENOL) 325 MG tablet Take 325 mg by mouth as needed.    [name] amLODipine (NORVASC) 10 MG tablet Take 1 tablet (10 mg total) by mouth daily. 30 tablet 3  . docusate sodium 100 MG CAPS Take 100 mg by mouth 2 (two) times daily as needed for mild constipation. 10 capsule 0  . HYDROcodone-acetaminophen (NORCO/VICODIN) 5-325 MG tablet Take 1 tablet by mouth every 6 (six) hours as needed for moderate pain. 90 tablet 0  . lidocaine (LIDODERM) 5 % PLACE 1 PATCH ONTO THE SKIN EVERY DAY REMOVE AND DISCARD WITHIN 12 HOURS OR AS DIRECTED BY PRESCRIBER 90 patch 1  . lidocaine-prilocaine (EMLA) cream Apply to affected area once 30 g 3  . losartan (COZAAR) 50 MG tablet Take 1 tablet (50 mg total) by mouth daily. 90 tablet 3  . ondansetron (ZOFRAN) 8 MG tablet Take 1 tablet (8 mg total) by mouth every 8 (eight) hours as needed. Start on the third day after chemotherapy. 30 tablet 1  . pregabalin (LYRICA) 200 MG capsule Take 1 capsule (200 mg total) by mouth every 8 (eight) hours. Please give patient brand name Lyrica 270 capsule 4  . prochlorperazine (COMPAZINE) 10 MG tablet Take 1 tablet (10 mg total) by mouth every 6 (six) hours as needed (Nausea or vomiting). 30 tablet 1   No current facility-administered medications for this visit.   Facility-Administered Medications Ordered in Other Visits  Medication Dose Route Frequency Provider Last Rate Last Admin  . bevacizumab-awwb (MVASI) 900 mg in sodium chloride 0.9 % 100 mL chemo infusion  15 mg/kg (Treatment Plan Recorded) Intravenous Once Alvy Bimler, Hiba Garry, MD      . CARBOplatin (PARAPLATIN) 290 mg in sodium chloride 0.9 % 250 mL chemo infusion  290 mg Intravenous Once Alvy Bimler, Vanessia Bokhari, MD      . dexamethasone (DECADRON) 10 mg in sodium chloride 0.9 % 50 mL IVPB  10 mg Intravenous Once Alvy Bimler, Shavonte Zhao, MD      . famotidine (PEPCID) IVPB 20 mg in NS 100 mL IVPB  20 mg Intravenous Once Alvy Bimler, Mayre Bury,  MD 400 mL/hr at 04/29/21 1317 20 mg at 04/29/21 1317  . fosaprepitant (EMEND) 150 mg in sodium chloride 0.9 % 145 mL IVPB  150 mg Intravenous Once Alvy Bimler, Daniil Labarge, MD      . gemcitabine (GEMZAR) 418 mg in sodium chloride 0.9 % 250 mL chemo infusion  240 mg/m2 (Treatment Plan Recorded) Intravenous Once Alvy Bimler, Erinn Mendosa, MD      . heparin lock flush 100 unit/mL  500 Units Intracatheter Once PRN Alvy Bimler, Reubin Bushnell, MD      . sodium chloride flush (NS) 0.9 % injection 10 mL  10 mL Intracatheter PRN Alvy Bimler, Jaisen Wiltrout, MD        PHYSICAL EXAMINATION: ECOG PERFORMANCE STATUS: 1 - Symptomatic but completely ambulatory Blood pressure 163/85 Heart rate 69 Respiration rate 18 GENERAL:alert, no distress and comfortable SKIN: skin color, texture, turgor are normal, no rashes or significant lesions EYES: normal, Conjunctiva are pink and non-injected, sclera clear OROPHARYNX:no  exudate, no erythema and lips, buccal mucosa, and tongue normal  NECK: supple, thyroid normal size, non-tender, without nodularity LYMPH:  no palpable lymphadenopathy in the cervical, axillary or inguinal LUNGS: clear to auscultation and percussion with normal breathing effort HEART: regular rate & rhythm and no murmurs and no lower extremity edema ABDOMEN:abdomen soft, non-tender and normal bowel sounds Musculoskeletal:no cyanosis of digits and no clubbing  NEURO: alert & oriented x 3 with fluent speech, no focal motor/sensory deficits  LABORATORY DATA:  I have reviewed the data as listed    Component Value Date/Time   NA 142 04/29/2021 1223   NA 141 10/07/2017 1436   K 4.3 04/29/2021 1223   K 4.4 10/07/2017 1436   CL 106 04/29/2021 1223   CO2 25 04/29/2021 1223   CO2 26 10/07/2017 1436   GLUCOSE 95 04/29/2021 1223   GLUCOSE 85 10/07/2017 1436   BUN 15 04/29/2021 1223   BUN 14.9 10/07/2017 1436   CREATININE 0.76 04/29/2021 1223   CREATININE 1.0 10/07/2017 1436   CALCIUM 9.4 04/29/2021 1223   CALCIUM 9.5 10/07/2017 1436   PROT 7.2  04/29/2021 1223   PROT 7.8 10/07/2017 1436   ALBUMIN 3.9 04/29/2021 1223   ALBUMIN 3.9 10/07/2017 1436   AST 19 04/29/2021 1223   AST 15 10/07/2017 1436   ALT 11 04/29/2021 1223   ALT 7 10/07/2017 1436   ALKPHOS 59 04/29/2021 1223   ALKPHOS 81 10/07/2017 1436   BILITOT 0.4 04/29/2021 1223   BILITOT 0.54 10/07/2017 1436   GFRNONAA >60 04/29/2021 1223   GFRAA >60 08/15/2020 1121    No results found for: SPEP, UPEP  Lab Results  Component Value Date   WBC 5.8 04/29/2021   NEUTROABS 3.1 04/29/2021   HGB 11.8 (L) 04/29/2021   HCT 34.2 (L) 04/29/2021   MCV 109.6 (H) 04/29/2021   PLT 139 (L) 04/29/2021      Chemistry      Component Value Date/Time   NA 142 04/29/2021 1223   NA 141 10/07/2017 1436   K 4.3 04/29/2021 1223   K 4.4 10/07/2017 1436   CL 106 04/29/2021 1223   CO2 25 04/29/2021 1223   CO2 26 10/07/2017 1436   BUN 15 04/29/2021 1223   BUN 14.9 10/07/2017 1436   CREATININE 0.76 04/29/2021 1223   CREATININE 1.0 10/07/2017 1436      Component Value Date/Time   CALCIUM 9.4 04/29/2021 1223   CALCIUM 9.5 10/07/2017 1436   ALKPHOS 59 04/29/2021 1223   ALKPHOS 81 10/07/2017 1436   AST 19 04/29/2021 1223   AST 15 10/07/2017 1436   ALT 11 04/29/2021 1223   ALT 7 10/07/2017 1436   BILITOT 0.4 04/29/2021 1223   BILITOT 0.54 10/07/2017 1436

## 2021-04-29 NOTE — Assessment & Plan Note (Signed)
She had prolonged pancytopenia requiring significant dose adjustment and prolonged interval between 1 treatment to another A tumor marker appears to be responding She feels great I recommend resumption of treatment today but I plan to modify her chemotherapy in the future She will receive all treatment: Carboplatin, gemcitabine and bevacizumab with reduced doses all in 1 day, space out every 4 weeks In 4 weeks time, we will be running into public holiday She wants to keep all her appointment on Mondays and she will return the week after 4 July for cycle 4 After cycle 4, I plan to repeat imaging study for objective assessment of response to treatment

## 2021-04-29 NOTE — Progress Notes (Signed)
Per Dr. Alvy Bimler, may proceed with treatment without urine protein result.

## 2021-04-29 NOTE — Assessment & Plan Note (Signed)
Her blood pressure is elevated today but at home, they were within normal range We will proceed with treatment without delay

## 2021-04-30 LAB — CA 125: Cancer Antigen (CA) 125: 431 U/mL — ABNORMAL HIGH (ref 0.0–38.1)

## 2021-05-21 ENCOUNTER — Other Ambulatory Visit: Payer: Self-pay | Admitting: Hematology and Oncology

## 2021-05-21 ENCOUNTER — Telehealth: Payer: Self-pay

## 2021-05-21 MED ORDER — HYDROCODONE-ACETAMINOPHEN 5-325 MG PO TABS: 1.0000 | ORAL_TABLET | Freq: Four times a day (QID) | ORAL | 0 refills | Status: DC | PRN

## 2021-05-21 NOTE — Telephone Encounter (Signed)
Called and left a message Rx sent. Ask her to call the office for questions. 

## 2021-05-21 NOTE — Telephone Encounter (Signed)
done

## 2021-05-21 NOTE — Telephone Encounter (Signed)
She called and requested a refill on Hydrocodone Rx.

## 2021-05-24 ENCOUNTER — Encounter: Payer: Self-pay | Admitting: Hematology and Oncology

## 2021-05-29 ENCOUNTER — Telehealth: Payer: Self-pay

## 2021-05-29 ENCOUNTER — Telehealth: Payer: Self-pay | Admitting: Hematology and Oncology

## 2021-05-29 NOTE — Telephone Encounter (Signed)
Returned her call. She is going out of town for a family emergency and is canceling 7/11 appts. She will be back in town on 7/12. 7/11 appts canceled and sent scheduling message to reschedule.

## 2021-05-29 NOTE — Telephone Encounter (Signed)
R/s appts per 7/6 sch msg. Pt aware.

## 2021-06-03 ENCOUNTER — Ambulatory Visit: Payer: 59 | Admitting: Hematology and Oncology

## 2021-06-03 ENCOUNTER — Other Ambulatory Visit: Payer: 59

## 2021-06-03 ENCOUNTER — Ambulatory Visit: Payer: 59

## 2021-06-06 ENCOUNTER — Inpatient Hospital Stay (HOSPITAL_BASED_OUTPATIENT_CLINIC_OR_DEPARTMENT_OTHER): Payer: 59 | Admitting: Hematology and Oncology

## 2021-06-06 ENCOUNTER — Inpatient Hospital Stay: Payer: 59 | Attending: Hematology and Oncology

## 2021-06-06 ENCOUNTER — Encounter: Payer: Self-pay | Admitting: Hematology and Oncology

## 2021-06-06 ENCOUNTER — Inpatient Hospital Stay: Payer: 59

## 2021-06-06 ENCOUNTER — Other Ambulatory Visit: Payer: Self-pay

## 2021-06-06 VITALS — BP 166/86 | HR 70 | Temp 97.7°F | Resp 18 | Ht 67.0 in | Wt 132.6 lb

## 2021-06-06 DIAGNOSIS — C562 Malignant neoplasm of left ovary: Secondary | ICD-10-CM | POA: Insufficient documentation

## 2021-06-06 DIAGNOSIS — Z5112 Encounter for antineoplastic immunotherapy: Secondary | ICD-10-CM | POA: Insufficient documentation

## 2021-06-06 DIAGNOSIS — I10 Essential (primary) hypertension: Secondary | ICD-10-CM

## 2021-06-06 DIAGNOSIS — Z79899 Other long term (current) drug therapy: Secondary | ICD-10-CM | POA: Diagnosis not present

## 2021-06-06 DIAGNOSIS — G893 Neoplasm related pain (acute) (chronic): Secondary | ICD-10-CM | POA: Insufficient documentation

## 2021-06-06 DIAGNOSIS — Z5111 Encounter for antineoplastic chemotherapy: Secondary | ICD-10-CM | POA: Diagnosis present

## 2021-06-06 DIAGNOSIS — Z9221 Personal history of antineoplastic chemotherapy: Secondary | ICD-10-CM | POA: Insufficient documentation

## 2021-06-06 DIAGNOSIS — Z85038 Personal history of other malignant neoplasm of large intestine: Secondary | ICD-10-CM | POA: Insufficient documentation

## 2021-06-06 DIAGNOSIS — C182 Malignant neoplasm of ascending colon: Secondary | ICD-10-CM

## 2021-06-06 DIAGNOSIS — G620 Drug-induced polyneuropathy: Secondary | ICD-10-CM | POA: Insufficient documentation

## 2021-06-06 DIAGNOSIS — R110 Nausea: Secondary | ICD-10-CM | POA: Insufficient documentation

## 2021-06-06 DIAGNOSIS — Z90710 Acquired absence of both cervix and uterus: Secondary | ICD-10-CM | POA: Insufficient documentation

## 2021-06-06 LAB — CMP (CANCER CENTER ONLY)
ALT: 8 U/L (ref 0–44)
AST: 17 U/L (ref 15–41)
Albumin: 4 g/dL (ref 3.5–5.0)
Alkaline Phosphatase: 62 U/L (ref 38–126)
Anion gap: 9 (ref 5–15)
BUN: 18 mg/dL (ref 6–20)
CO2: 27 mmol/L (ref 22–32)
Calcium: 9.6 mg/dL (ref 8.9–10.3)
Chloride: 105 mmol/L (ref 98–111)
Creatinine: 0.76 mg/dL (ref 0.44–1.00)
GFR, Estimated: >60 mL/min (ref >60)
Glucose, Bld: 96 mg/dL (ref 70–99)
Potassium: 4 mmol/L (ref 3.5–5.1)
Sodium: 141 mmol/L (ref 135–145)
Total Bilirubin: 0.4 mg/dL (ref 0.3–1.2)
Total Protein: 7.2 g/dL (ref 6.5–8.1)

## 2021-06-06 LAB — CBC WITH DIFFERENTIAL (CANCER CENTER ONLY)
Abs Immature Granulocytes: 0 10*3/uL (ref 0.00–0.07)
Basophils Absolute: 0 10*3/uL (ref 0.0–0.1)
Basophils Relative: 0 %
Eosinophils Absolute: 0.1 10*3/uL (ref 0.0–0.5)
Eosinophils Relative: 1 %
HCT: 34.7 % — ABNORMAL LOW (ref 36.0–46.0)
Hemoglobin: 12.3 g/dL (ref 12.0–15.0)
Immature Granulocytes: 0 %
Lymphocytes Relative: 60 %
Lymphs Abs: 3.2 10*3/uL (ref 0.7–4.0)
MCH: 37.4 pg — ABNORMAL HIGH (ref 26.0–34.0)
MCHC: 35.4 g/dL (ref 30.0–36.0)
MCV: 105.5 fL — ABNORMAL HIGH (ref 80.0–100.0)
Monocytes Absolute: 0.6 10*3/uL (ref 0.1–1.0)
Monocytes Relative: 10 %
Neutro Abs: 1.6 10*3/uL — ABNORMAL LOW (ref 1.7–7.7)
Neutrophils Relative %: 29 %
Platelet Count: 101 10*3/uL — ABNORMAL LOW (ref 150–400)
RBC: 3.29 MIL/uL — ABNORMAL LOW (ref 3.87–5.11)
RDW: 13.5 % (ref 11.5–15.5)
WBC Count: 5.4 10*3/uL (ref 4.0–10.5)
nRBC: 0 % (ref 0.0–0.2)

## 2021-06-06 LAB — TOTAL PROTEIN, URINE DIPSTICK: Protein, ur: NEGATIVE mg/dL

## 2021-06-06 MED ORDER — DIPHENHYDRAMINE HCL 50 MG/ML IJ SOLN
25.0000 mg | Freq: Once | INTRAMUSCULAR | Status: AC
  Administered 2021-06-06: 25 mg via INTRAVENOUS

## 2021-06-06 MED ORDER — SODIUM CHLORIDE 0.9 % IV SOLN
288.0000 mg | Freq: Once | INTRAVENOUS | Status: AC
  Administered 2021-06-06: 290 mg via INTRAVENOUS
  Filled 2021-06-06: qty 29

## 2021-06-06 MED ORDER — PALONOSETRON HCL INJECTION 0.25 MG/5ML
0.2500 mg | Freq: Once | INTRAVENOUS | Status: AC
  Administered 2021-06-06: 0.25 mg via INTRAVENOUS

## 2021-06-06 MED ORDER — PALONOSETRON HCL INJECTION 0.25 MG/5ML
INTRAVENOUS | Status: AC
  Filled 2021-06-06: qty 5

## 2021-06-06 MED ORDER — SODIUM CHLORIDE 0.9 % IV SOLN
240.0000 mg/m2 | Freq: Once | INTRAVENOUS | Status: AC
  Administered 2021-06-06: 418 mg via INTRAVENOUS
  Filled 2021-06-06: qty 10.99

## 2021-06-06 MED ORDER — SODIUM CHLORIDE 0.9% FLUSH
10.0000 mL | Freq: Once | INTRAVENOUS | Status: AC
Start: 2021-06-06 — End: 2021-06-06
  Administered 2021-06-06: 10 mL
  Filled 2021-06-06: qty 10

## 2021-06-06 MED ORDER — DIPHENHYDRAMINE HCL 50 MG/ML IJ SOLN
INTRAMUSCULAR | Status: AC
  Filled 2021-06-06: qty 1

## 2021-06-06 MED ORDER — FAMOTIDINE 20 MG IN NS 100 ML IVPB
20.0000 mg | Freq: Once | INTRAVENOUS | Status: AC
  Administered 2021-06-06: 20 mg via INTRAVENOUS

## 2021-06-06 MED ORDER — FAMOTIDINE 20 MG IN NS 100 ML IVPB
INTRAVENOUS | Status: AC
  Filled 2021-06-06: qty 100

## 2021-06-06 MED ORDER — SODIUM CHLORIDE 0.9 % IV SOLN
10.0000 mg | Freq: Once | INTRAVENOUS | Status: AC
  Administered 2021-06-06: 10 mg via INTRAVENOUS
  Filled 2021-06-06: qty 10

## 2021-06-06 MED ORDER — SODIUM CHLORIDE 0.9 % IV SOLN
150.0000 mg | Freq: Once | INTRAVENOUS | Status: AC
  Administered 2021-06-06: 150 mg via INTRAVENOUS
  Filled 2021-06-06: qty 150

## 2021-06-06 MED ORDER — HEPARIN SOD (PORK) LOCK FLUSH 100 UNIT/ML IV SOLN
500.0000 [IU] | Freq: Once | INTRAVENOUS | Status: AC | PRN
  Administered 2021-06-06: 500 [IU]
  Filled 2021-06-06: qty 5

## 2021-06-06 MED ORDER — SODIUM CHLORIDE 0.9% FLUSH
10.0000 mL | INTRAVENOUS | Status: DC | PRN
  Administered 2021-06-06: 10 mL
  Filled 2021-06-06: qty 10

## 2021-06-06 MED ORDER — SODIUM CHLORIDE 0.9 % IV SOLN
Freq: Once | INTRAVENOUS | Status: AC
Start: 2021-06-06 — End: 2021-06-06
  Filled 2021-06-06: qty 250

## 2021-06-06 MED ORDER — SODIUM CHLORIDE 0.9 % IV SOLN
15.0000 mg/kg | Freq: Once | INTRAVENOUS | Status: AC
  Administered 2021-06-06: 900 mg via INTRAVENOUS
  Filled 2021-06-06: qty 4

## 2021-06-06 NOTE — Assessment & Plan Note (Signed)
Her pancytopenia has almost completely resolved We will proceed with treatment as above with dosage adjustment as before

## 2021-06-06 NOTE — Assessment & Plan Note (Signed)
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

## 2021-06-06 NOTE — Assessment & Plan Note (Signed)
Her blood pressure is elevated today likely due to anxiety and pain She will continue current prescribed pain medicine and blood pressure medication

## 2021-06-06 NOTE — Progress Notes (Signed)
[facility] OFFICE PROGRESS NOTE  Patient Care Team: Marrian Salvage, [place] as PCP - General (Internal Medicine) Judeth Horn, MD as Consulting Physician (General Surgery) Nat Math, MD as Referring Physician (Obstetrics and Gynecology) Elam Dutch, MD as Consulting Physician (Vascular Surgery) Heath Lark, MD as Consulting Physician (Hematology and Oncology)  ASSESSMENT & PLAN:  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 [facility]) She tolerated last cycle of treatment better Her blood pressure from home is within normal limits We will proceed with treatment as scheduled and then I plan to order CT imaging in 2 weeks for further follow-up  Pancytopenia, acquired [facility]) Her pancytopenia has almost completely resolved We will proceed with treatment as above with dosage adjustment as before  Essential hypertension Her blood pressure is elevated today likely due to anxiety and pain She will continue current prescribed pain medicine and blood pressure medication  Cancer associated pain She has chronic back pain and severe peripheral neuropathy from prior treatment I have refilled her prescription recently She will continue the same   Nausea without vomiting She will continue antiemetics as prescribed  Orders Placed This Encounter  Procedures   CT ABDOMEN PELVIS W CONTRAST    Standing Status:   Future    Standing Expiration Date:   06/06/2022    Order Specific Question:   If indicated for the ordered procedure, I authorize the administration of contrast media per Radiology protocol    Answer:   Yes    Order Specific Question:   Preferred imaging location?    Answer:   [facility]    Order Specific Question:   Radiology Contrast Protocol - do NOT remove file path    Answer:   [email or web address]    Order Specific Question:   Is patient pregnant?    Answer:   No    All questions were answered. The patient knows to call the  clinic with any problems, questions or concerns. The total time spent in the appointment was 20 minutes encounter with patients including review of chart and various tests results, discussions about plan of care and coordination of care plan   Heath Lark, MD 06/06/2021 10:10 AM  INTERVAL HISTORY: Please see below for problem oriented charting. She returns for chemotherapy and follow-up She complained of intermittent back pain Her blood pressure from home were within normal range She denies recent constipation She have occasional rare nausea The patient denies any recent signs or symptoms of bleeding such as spontaneous epistaxis, hematuria or hematochezia.   SUMMARY OF ONCOLOGIC HISTORY: Oncology History Overview Note  BRCA1 positive   Cancer of right colon ([place])  09/16/2014 Imaging   CT abd/pel:  a 3.8cm cecal mass with associated ileocecal intussusception, and a 9.1cm solid and cystic mass in the left pelvis,    09/18/2014 Surgery   right hemicolectomy and terminal ileoectomy    09/18/2014 Pathologic Stage   pT3pN1Mx, tumor extends into pericolonic soft tissue and is less than 60m from the serosal surface, LVI 8-), PNI (-), all 23 node negative, one soft tissue tumor deposit, surgical margins negative.     09/18/2014 Initial Diagnosis   Adenocarcinoma of right colon    08/06/2016 Imaging   CT chest/abd/pelvis with contrast IMPRESSION: 1. No recurrent malignancy identified. 2. 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 On the prior exam from 09/13/2015 there was back flow of contrast in the left external iliac artery to supply the left internal  iliac artery ; this back flow of contrast is no longer present in the left external iliac artery is occluded. 3. Several pulmonary nodules at or below 4 mm diameter, unchanged. 4. 3 mm hypodensity posteriorly in the body of the pancreas, not well seen previously  and subtle today, likely incidental, but merits observation. 5. 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 Prominent degenerative spurring of both acetabula. 6. Lumbar spondylosis and degenerative disc disease causing prominent impingement at L4-5 and mild impingement at L5-S1.   03/08/2017 Imaging   CT CAP W Contrast 03/08/17 IMPRESSION: Chest Impression: 1. Stable small pulmonary nodules within LEFT lung. 2. No mediastinal lymphadenopathy.  Abdomen / Pelvis Impression: 1. No evidence of metastatic colorectal cancer or ovarian cancer within the abdomen pelvis. 2. No lymphadenopathy. 3. Postsurgical change consistent RIGHT hemicolectomy and hysterectomy 4. A fem-fem arterial bypass noted.   11/13/2017 Imaging   CT AP w contrast IMPRESSION: 1. No findings of recurrent malignancy in the abdomen or pelvis. 2. Other imaging findings of potential clinical significance:Chronic AVN of both femoral heads without contour abnormality. Lumbar impingement at L4-5 and L5-S1. Aortic Atherosclerosis (ICD10-I70.0). 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 Partial right hemicolectomy. Hysterectomy.   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 This was discovered during her workup for colon cancer.    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 Tumor size 3.7 cm, no lymph nodes removed. T1cNx. Tumor cells are positive for cytokeratin 7 and estrogen receptor.    11/01/2014 Surgery   B/l Salpingo-oophorectomy.   11/01/2014 Initial Diagnosis   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    11/28/2014 Cancer Staging   Staging form: Ovary, AJCC 7th Edition - Clinical: Stage III (rT3, N0, M0) - Signed by  Heath Lark, MD on 06/09/2019    03/08/2017 Imaging   CT CAP W Contrast 03/08/17 IMPRESSION: Chest Impression:  1. Stable small pulmonary nodules within LEFT lung. 2. No mediastinal lymphadenopathy.  Abdomen / Pelvis Impression:  1. No evidence of metastatic colorectal cancer or ovarian cancer within the abdomen pelvis. 2. No lymphadenopathy. 3. Postsurgical change consistent RIGHT hemicolectomy and hysterectomy 4. A fem-fem arterial bypass noted.   03/12/2017 Mammogram   MM Right Breast 03/12/17 IMPRESSION: No mammographic or sonographic evidence of malignancy. No correlate identified for the abnormal enhancement seen within the right nipple on MRI of 02/27/2017.    04/02/2017 Pathology Results   Surgical Pathology of Nipple Biopsy: 04/02/17 Diagnosis Nipple Biopsy, Right - BENIGN NIPPLE TISSUE. - NO ATYPIA OR MALIGNANCY.    11/13/2017 Imaging   1. No findings of recurrent malignancy in the abdomen or pelvis. 2. Other imaging findings of potential clinical significance: Chronic AVN of both femoral heads without contour abnormality. Lumbar impingement at L4-5 and L5-S1. Aortic Atherosclerosis (ICD10-I70.0). 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 Partial right hemicolectomy. Hysterectomy.   03/22/2018 Mammogram   Screening mammogram IMPRESSION No evidence of malignancy.      05/02/2019 Tumor Marker   Patient's tumor was tested for the following markers: CA-125 Results of the tumor marker test revealed 360   05/24/2019 Imaging   CT abdomen and pelvis  New peritoneal carcinomatosis in the pelvis and right lower quadrant.   06/06/2019 Imaging   1. Tiny bilateral pulmonary nodules stable since 11/12/2018. Continued close attention on follow-up recommended. 2. Incompletely visualized upper abdominal lymphadenopathy better characterized on the abdomen CT from 2 weeks ago. 3.  Aortic  Atherosclerois (ICD10-170.0) 4.  Emphysema. (MAU63-F35.9)   06/07/2019 Procedure   Technically successful CT guided core needle biopsy of indeterminate peritoneal nodule within the right lower abdomen/pelvis.   06/07/2019 Pathology Results   Soft Tissue Needle Core Biopsy, peritoneal nodule within right lower abdomen - METASTATIC CARCINOMA CONSISTENT WITH PATIENT'S CLINICAL HISTORY OF PRIMARY OVARIAN CARCINOMA. SEE NOTE Diagnosis Note Immunohistochemical stains show that the tumor cells are positive for CK7, ER and PAX 8; and negative for CK20 and CDX2. This immunostaining profile is consistent with above diagnosis.   06/16/2019 Tumor Marker   Patient's tumor was tested for the following markers: CA-125 Results of the tumor marker test revealed 499   06/20/2019 - 11/14/2019 Chemotherapy   The patient had carboplatin and taxol   07/11/2019 Tumor Marker   Patient's tumor was tested for the following markers: CA-125 Results of the tumor marker test revealed 438   08/05/2019 Tumor Marker   Patient's tumor was tested for the following markers: CA-125 Results of the tumor marker test revealed 272.   08/25/2019 Imaging   Ct chest, abdomen and pelvis 1. Slight interval decrease in size of a right lower quadrant peritoneal nodule measuring 1.7 x 1.6 cm, previously 2.2 x 2.2 cm (series 2, image 94, series 5, image 58). Findings are consistent with treatment response.   2. No significant change in ill-defined soft tissue about the sigmoid colon in the hysterectomy bed (series 2, image 101).   3.  Status post hysterectomy.   4. No evidence of metastatic disease in the chest. Stable benign small pulmonary nodules.   5.  Coronary artery disease.  Aortic Atherosclerosis (ICD10-I70.0).     08/26/2019 Tumor Marker   Patient's tumor was tested for the following markers: CA-125 Results of the tumor marker test revealed 189.   09/26/2019 Tumor Marker   Patient's tumor was tested for the following  markers: CA-125 Results of the tumor marker test revealed 137   10/03/2019 Tumor Marker   Patient's tumor was tested for the following markers: CA-125 Results of the tumor marker test revealed 137.   10/31/2019 Tumor Marker   Patient's tumor was tested for the following markers: CA-125 Results of the tumor marker test revealed 101   11/29/2019 Imaging   1. Right lower quadrant peritoneal implant is decreased. Stable irregular nodular pelvic peritoneal thickening superior to the vaginal cuff. 2. No new or progressive metastatic disease. No abdominopelvic lymphadenopathy. 3.  Aortic Atherosclerosis (ICD10-I70.0).   12/15/2019 -  Chemotherapy   The patient had Lynparza for chemotherapy treatment.     12/23/2019 Tumor Marker   Patient's tumor was tested for the following markers: CA-125 Results of the tumor marker test revealed 55.9   01/02/2020 Tumor Marker   Patient's tumor was tested for the following markers: CA-125. Results of the tumor marker test revealed 58.6   02/03/2020 Tumor Marker   Patient's tumor was tested for the following markers: CA-125 Results of the tumor marker test revealed 52.2   02/24/2020 Tumor Marker   Patient's tumor was tested for the following markers: CA-125 Results of the tumor marker test revealed 64.8   04/19/2020 Imaging   1. Continued slight interval decrease in size of a soft tissue  nodule in the right paracolic gutter, consistent with treatment response. 2. Soft tissue in the hysterectomy bed near the distal sigmoid colon and rectum may be slightly decreased in size, difficult to measure due to configuration but apparently diminished in size, also consistent with treatment response. 3. Status post hysterectomy and oophorectomy as well as right colon resection. 4. No evidence of new metastatic disease in the abdomen or pelvis. 5. Redemonstrated long segment occlusion of the left common, internal, and external iliac arteries. Status post femoral-femoral  bypass grafting with reconstitution of the left common femoral artery. Aortic Atherosclerosis (ICD10-I70.0).   08/15/2020 Tumor Marker   Patient's tumor was tested for the following markers: CA-125 Results of the tumor marker test revealed 47.1   10/22/2020 Imaging   1. Reduced size of the nodule along the right paracolic gutter, currently 0.6 by 1.2 cm, previously 0.7 by 1.4 cm on 04/19/2020 and previously 0.9 by 1.5 cm on 11/29/2019. Appearance compatible with treatment response. 2. Further mild reduction in size of the bandlike density along the rectosigmoid mesentery, probably from scarring or effectively treated tumor. 3. Prominent stool throughout the colon favors constipation.  4. Stable chronic occlusion of the left iliac arteries, with a patent fem-fem bypass. 5. 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 6. Lower lumbar spondylosis and degenerative disc disease causing impingement at L4-5 and L5-S1. 7. Aortic atherosclerosis.     10/23/2020 Tumor Marker   Patient's tumor was tested for the following markers: CA-125 Results of the tumor marker test revealed 74.6   01/21/2021 Tumor Marker   Patient's tumor was tested for the following markers: CA-125 Results of the tumor marker test revealed 825   01/28/2021 Imaging   1. Changes of right hemicolectomy with slightly decreased size of the nodule along the right pericolic gutter and similar band like density along the rectosigmoid mesentery. 2. Two new well-circumscribed low-density nodules in the presacral perirectal space, difficult to ascertain whether they arise from the bowel wall or arise from within the peritoneum. This is a nonspecific finding which could be further evaluated with colonoscopy, repeat CT with rectal contrast or attention on close interval follow-up imaging in 3 months. 3. Stable chronic occlusion of the left iliac arteries with patent  fem-fem bypass. 4. 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 5. Emphysema and aortic atherosclerosis. Aortic Atherosclerosis (ICD10-I70.0) and Emphysema (ICD10-J43.9).   02/08/2021 Procedure   Successful placement of a right internal jugular approach power injectable Port-A-Cath. The catheter is ready for immediate use.     02/11/2021 -  Chemotherapy    Patient is on Treatment Plan: OVARIAN RECURRENT 3RD LINE CARBOPLATIN D1 / GEMCITABINE D1,8 (4/800) Q21D WITH BEVACIZUMAB       02/18/2021 Tumor Marker   Patient's tumor was tested for the following markers: CA-125 Results of the tumor marker test revealed 583   03/04/2021 Tumor Marker   Patient's tumor was tested for the following markers: CA-125. Results of the tumor marker test revealed 541   03/11/2021 Tumor Marker   Patient's tumor was tested for the following markers: CA-125 Results of the tumor marker test revealed 568   03/25/2021 Tumor Marker   Patient's tumor was tested for the following markers:CA-125 Results of the tumor marker test revealed 367.   04/08/2021 Tumor Marker   Patient's tumor was tested for the following markers: CA-125 Results of the tumor marker test revealed 273   04/29/2021 Tumor Marker  Patient's tumor was tested for the following markers: CA-125 Results of the tumor marker test revealed 431     REVIEW OF SYSTEMS:   Constitutional: Denies fevers, chills or abnormal weight loss Eyes: Denies blurriness of vision Ears, nose, mouth, throat, and face: Denies mucositis or sore throat Respiratory: Denies cough, dyspnea or wheezes Cardiovascular: Denies palpitation, chest discomfort or lower extremity swelling Gastrointestinal:  Denies nausea, heartburn or change in bowel habits Skin: Denies abnormal skin rashes Lymphatics: Denies new lymphadenopathy or easy bruising Neurological:Denies numbness, tingling or new weaknesses Behavioral/Psych: Mood is stable, no new changes  All other  systems were reviewed with the patient and are negative.  I have reviewed the past medical history, past surgical history, social history and family history with the patient and they are unchanged from previous note.  ALLERGIES:  is allergic to other, demerol [meperidine], morphine and related, oxycodone, penicillins, strawberry flavor, tylenol [acetaminophen], and ibuprofen.  MEDICATIONS:  Current Outpatient Medications  Medication Sig Dispense Refill   acetaminophen (TYLENOL) 325 MG tablet Take 325 mg by mouth as needed.     amLODipine (NORVASC) 10 MG tablet Take 1 tablet (10 mg total) by mouth daily. 30 tablet 3   docusate sodium 100 MG CAPS Take 100 mg by mouth 2 (two) times daily as needed for mild constipation. 10 capsule 0   HYDROcodone-acetaminophen (NORCO/VICODIN) 5-325 MG tablet Take 1 tablet by mouth every 6 (six) hours as needed for moderate pain. 90 tablet 0   lidocaine (LIDODERM) 5 % PLACE 1 PATCH ONTO THE SKIN EVERY DAY REMOVE AND DISCARD WITHIN 12 HOURS OR AS DIRECTED BY PRESCRIBER 90 patch 1   lidocaine-prilocaine (EMLA) cream Apply to affected area once 30 g 3   losartan (COZAAR) 50 MG tablet Take 1 tablet (50 mg total) by mouth daily. 90 tablet 3   ondansetron (ZOFRAN) 8 MG tablet Take 1 tablet (8 mg total) by mouth every 8 (eight) hours as needed. Start on the third day after chemotherapy. 30 tablet 1   pregabalin (LYRICA) 200 MG capsule Take 1 capsule (200 mg total) by mouth every 8 (eight) hours. Please give patient brand name Lyrica 270 capsule 4   prochlorperazine (COMPAZINE) 10 MG tablet Take 1 tablet (10 mg total) by mouth every 6 (six) hours as needed (Nausea or vomiting). 30 tablet 1   No current facility-administered medications for this visit.   Facility-Administered Medications Ordered in Other Visits  Medication Dose Route Frequency Provider Last Rate Last Admin   0.9 %  sodium chloride infusion   Intravenous Once Alvy Bimler, Amandine Covino, MD       bevacizumab-awwb (MVASI)  900 mg in sodium chloride 0.9 % 100 mL chemo infusion  15 mg/kg (Treatment Plan Recorded) Intravenous Once Alvy Bimler, Patricie Geeslin, MD       CARBOplatin (PARAPLATIN) 290 mg in sodium chloride 0.9 % 100 mL chemo infusion  290 mg Intravenous Once Alvy Bimler, Mliss Wedin, MD       dexamethasone (DECADRON) 10 mg in sodium chloride 0.9 % 50 mL IVPB  10 mg Intravenous Once Alvy Bimler, Alesi Zachery, MD       diphenhydrAMINE (BENADRYL) injection 25 mg  25 mg Intravenous Once Alvy Bimler, Kiah Keay, MD       famotidine (PEPCID) IVPB 20 mg in NS 100 mL IVPB  20 mg Intravenous Once Alvy Bimler, Daivion Pape, MD       fosaprepitant (EMEND) 150 mg in sodium chloride 0.9 % 145 mL IVPB  150 mg Intravenous Once Heath Lark, MD  gemcitabine (GEMZAR) 418 mg in sodium chloride 0.9 % 250 mL chemo infusion  240 mg/m2 (Treatment Plan Recorded) Intravenous Once Alvy Bimler, Behr Cislo, MD       heparin lock flush 100 unit/mL  500 Units Intracatheter Once PRN Alvy Bimler, Mehak Roskelley, MD       palonosetron (ALOXI) injection 0.25 mg  0.25 mg Intravenous Once Katlynne Mckercher, MD       sodium chloride flush (NS) 0.9 % injection 10 mL  10 mL Intracatheter PRN Alvy Bimler, Shloime Keilman, MD        PHYSICAL EXAMINATION: ECOG PERFORMANCE STATUS: 1 - Symptomatic but completely ambulatory  Vitals:   06/06/21 0930  BP: (!) 166/86  Pulse: 70  Resp: 18  Temp: 97.7 F (36.5 C)  SpO2: 99%   Filed Weights   06/06/21 0930  Weight: 132 lb 9.6 oz (60.1 kg)    GENERAL:alert, no distress and comfortable SKIN: skin color, texture, turgor are normal, no rashes or significant lesions EYES: normal, Conjunctiva are pink and non-injected, sclera clear OROPHARYNX:no exudate, no erythema and lips, buccal mucosa, and tongue normal  NECK: supple, thyroid normal size, non-tender, without nodularity LYMPH:  no palpable lymphadenopathy in the cervical, axillary or inguinal LUNGS: clear to auscultation and percussion with normal breathing effort HEART: regular rate & rhythm and no murmurs and no lower extremity edema ABDOMEN:abdomen  soft, non-tender and normal bowel sounds Musculoskeletal:no cyanosis of digits and no clubbing  NEURO: alert & oriented x 3 with fluent speech, no focal motor/sensory deficits  LABORATORY DATA:  I have reviewed the data as listed    Component Value Date/Time   NA 141 06/06/2021 0901   NA 141 10/07/2017 1436   K 4.0 06/06/2021 0901   K 4.4 10/07/2017 1436   CL 105 06/06/2021 0901   CO2 27 06/06/2021 0901   CO2 26 10/07/2017 1436   GLUCOSE 96 06/06/2021 0901   GLUCOSE 85 10/07/2017 1436   BUN 18 06/06/2021 0901   BUN 14.9 10/07/2017 1436   CREATININE 0.76 06/06/2021 0901   CREATININE 1.0 10/07/2017 1436   CALCIUM 9.6 06/06/2021 0901   CALCIUM 9.5 10/07/2017 1436   PROT 7.2 06/06/2021 0901   PROT 7.8 10/07/2017 1436   ALBUMIN 4.0 06/06/2021 0901   ALBUMIN 3.9 10/07/2017 1436   AST 17 06/06/2021 0901   AST 15 10/07/2017 1436   ALT 8 06/06/2021 0901   ALT 7 10/07/2017 1436   ALKPHOS 62 06/06/2021 0901   ALKPHOS 81 10/07/2017 1436   BILITOT 0.4 06/06/2021 0901   BILITOT 0.54 10/07/2017 1436   GFRNONAA >60 06/06/2021 0901   GFRAA >60 08/15/2020 1121    No results found for: SPEP, UPEP  Lab Results  Component Value Date   WBC 5.4 06/06/2021   NEUTROABS 1.6 (L) 06/06/2021   HGB 12.3 06/06/2021   HCT 34.7 (L) 06/06/2021   MCV 105.5 (H) 06/06/2021   PLT 101 (L) 06/06/2021      Chemistry      Component Value Date/Time   NA 141 06/06/2021 0901   NA 141 10/07/2017 1436   K 4.0 06/06/2021 0901   K 4.4 10/07/2017 1436   CL 105 06/06/2021 0901   CO2 27 06/06/2021 0901   CO2 26 10/07/2017 1436   BUN 18 06/06/2021 0901   BUN 14.9 10/07/2017 1436   CREATININE 0.76 06/06/2021 0901   CREATININE 1.0 10/07/2017 1436      Component Value Date/Time   CALCIUM 9.6 06/06/2021 0901   CALCIUM 9.5 10/07/2017 1436  ALKPHOS 62 06/06/2021 0901   ALKPHOS 81 10/07/2017 1436   AST 17 06/06/2021 0901   AST 15 10/07/2017 1436   ALT 8 06/06/2021 0901   ALT 7 10/07/2017 1436    BILITOT 0.4 06/06/2021 0901   BILITOT 0.54 10/07/2017 1436

## 2021-06-06 NOTE — Assessment & Plan Note (Signed)
She will continue antiemetics as prescribed

## 2021-06-06 NOTE — Progress Notes (Signed)
Per Dr.Gorsuch, ok to treat with elevated BP

## 2021-06-06 NOTE — Assessment & Plan Note (Signed)
She tolerated last cycle of treatment better Her blood pressure from home is within normal limits We will proceed with treatment as scheduled and then I plan to order CT imaging in 2 weeks for further follow-up

## 2021-06-06 NOTE — Patient Instructions (Signed)
[facility]  Discharge Instructions: Thank you for choosing [facility] to provide your oncology and hematology care.   If you have a lab appointment with the [facility], please go directly to the [facility] and check in at the registration area.   Wear comfortable clothing and clothing appropriate for easy access to any Portacath or PICC line.   We strive to give you quality time with your provider. You may need to reschedule your appointment if you arrive late (15 or more minutes).  Arriving late affects you and other patients whose appointments are after yours.  Also, if you miss three or more appointments without notifying the office, you may be dismissed from the clinic at the provider's discretion.      For prescription refill requests, have your pharmacy contact our office and allow 72 hours for refills to be completed.    Today you received the following chemotherapy and/or immunotherapy agents avastin, gemzar, carboplatin      To help prevent nausea and vomiting after your treatment, we encourage you to take your nausea medication as directed.  BELOW ARE SYMPTOMS THAT SHOULD BE REPORTED IMMEDIATELY: *FEVER GREATER THAN 100.4 F (38 C) OR HIGHER *CHILLS OR SWEATING *NAUSEA AND VOMITING THAT IS NOT CONTROLLED WITH YOUR NAUSEA MEDICATION *UNUSUAL SHORTNESS OF BREATH *UNUSUAL BRUISING OR BLEEDING *URINARY PROBLEMS (pain or burning when urinating, or frequent urination) *BOWEL PROBLEMS (unusual diarrhea, constipation, pain near the anus) TENDERNESS IN MOUTH AND THROAT WITH OR WITHOUT PRESENCE OF ULCERS (sore throat, sores in mouth, or a toothache) UNUSUAL RASH, SWELLING OR PAIN  UNUSUAL VAGINAL DISCHARGE OR ITCHING   Items with * indicate a potential emergency and should be followed up as soon as possible or go to the Emergency Department if any problems should occur.  Please show the CHEMOTHERAPY ALERT CARD or IMMUNOTHERAPY ALERT  CARD at check-in to the Emergency Department and triage nurse.  Should you have questions after your visit or need to cancel or reschedule your appointment, please contact [facility]  Dept: [phone]  and follow the prompts.  Office hours are 8:00 a.m. to 4:30 p.m. Monday - Friday. Please note that voicemails left after 4:00 p.m. may not be returned until the following business day.  We are closed weekends and major holidays. You have access to a nurse at all times for urgent questions. Please call the main number to the clinic Dept: [phone] and follow the prompts.   For any non-urgent questions, you may also contact your provider using MyChart. We now offer e-Visits for anyone 60 and older to request care online for non-urgent symptoms. For details visit [email or web address].   Also download the MyChart app! Go to the app store, search "MyChart", open the app, select [facility], and log in with your MyChart username and password.  Due to Covid, a mask is required upon entering the hospital/clinic. If you do not have a mask, one will be given to you upon arrival. For doctor visits, patients may have 1 support person aged 22 or older with them. For treatment visits, patients cannot have anyone with them due to current Covid guidelines and our immunocompromised population.

## 2021-06-07 LAB — CA 125: Cancer Antigen (CA) 125: 594 U/mL — ABNORMAL HIGH (ref 0.0–38.1)

## 2021-06-10 ENCOUNTER — Telehealth: Payer: Self-pay | Admitting: Neurology

## 2021-06-10 NOTE — Telephone Encounter (Signed)
Pt requesting refill for pregabalin (LYRICA) 200 MG capsule. [facility]

## 2021-06-10 NOTE — Telephone Encounter (Signed)
Per last office notes in October 2021, she was instructed to continue care with PCP. I called her back to provide this update. She will contact her PCP office for the refills.

## 2021-06-13 ENCOUNTER — Telehealth: Payer: Self-pay

## 2021-06-13 ENCOUNTER — Other Ambulatory Visit: Payer: Self-pay | Admitting: Hematology and Oncology

## 2021-06-13 MED ORDER — PREGABALIN 200 MG PO CAPS: 200.0000 mg | ORAL_CAPSULE | Freq: Three times a day (TID) | ORAL | 4 refills | Status: DC

## 2021-06-13 NOTE — Telephone Encounter (Signed)
Called and told Rx sent. She verbalized understanding. She said thank you! She has appt with new PCP at [facility] on 10/7 and will try to call other offices to get a earlier appt.

## 2021-06-13 NOTE — Telephone Encounter (Signed)
I sent refill; i'm only comfortable with 90 pills at a time

## 2021-06-13 NOTE — Telephone Encounter (Signed)
Returned her call. Her neurologist usually gives her Lyrica Rx. The neurologist told her she not longer needs to see her and to get the Rx from PCP. Her PCP moved to [place] and is no longer with the practice. She is currently calling around trying to get a PCP. [facility] # to call and see if they are taking patients.  She is asking if you can fill the Lyrica Rx until she gets a PCP.

## 2021-06-14 ENCOUNTER — Telehealth: Payer: Self-pay

## 2021-06-14 NOTE — Telephone Encounter (Signed)
Returned her call. She is having a nose bleeding and anxiety attack. She became anxious and called 911. Paramedic answer phone when I called back. She is being evaluated now. Per paramedic, she is having minimal nose bleeding and did not take her blood pressure medication today.

## 2021-06-17 ENCOUNTER — Ambulatory Visit (HOSPITAL_COMMUNITY)
Admission: RE | Admit: 2021-06-17 | Discharge: 2021-06-17 | Disposition: A | Discharge status: Home / Self Care | Payer: 59 | Source: Ambulatory Visit | Attending: Hematology and Oncology | Admitting: Hematology and Oncology

## 2021-06-17 ENCOUNTER — Other Ambulatory Visit: Payer: Self-pay

## 2021-06-17 ENCOUNTER — Other Ambulatory Visit: Payer: Self-pay | Admitting: Hematology and Oncology

## 2021-06-17 ENCOUNTER — Telehealth: Payer: Self-pay

## 2021-06-17 ENCOUNTER — Encounter: Payer: Self-pay | Admitting: Hematology and Oncology

## 2021-06-17 DIAGNOSIS — C182 Malignant neoplasm of ascending colon: Secondary | ICD-10-CM

## 2021-06-17 DIAGNOSIS — C562 Malignant neoplasm of left ovary: Secondary | ICD-10-CM | POA: Diagnosis not present

## 2021-06-17 MED ORDER — IOHEXOL 300 MG/ML  SOLN
100.0000 mL | Freq: Once | INTRAMUSCULAR | Status: AC | PRN
  Administered 2021-06-17: 100 mL via INTRAVENOUS

## 2021-06-17 MED ORDER — HYDROCODONE-ACETAMINOPHEN 5-325 MG PO TABS: 1.0000 | ORAL_TABLET | Freq: Four times a day (QID) | ORAL | 0 refills | Status: DC | PRN

## 2021-06-17 NOTE — Telephone Encounter (Signed)
Called and left a message Rx sent. 

## 2021-06-17 NOTE — Telephone Encounter (Signed)
I will see her this week as scheduled

## 2021-06-17 NOTE — Telephone Encounter (Signed)
She called and left a message. Requesting a refill on Hydrocodone Rx.

## 2021-06-17 NOTE — Telephone Encounter (Signed)
Done

## 2021-06-20 ENCOUNTER — Inpatient Hospital Stay (HOSPITAL_BASED_OUTPATIENT_CLINIC_OR_DEPARTMENT_OTHER): Payer: 59 | Admitting: Hematology and Oncology

## 2021-06-20 ENCOUNTER — Other Ambulatory Visit: Payer: Self-pay | Admitting: Hematology and Oncology

## 2021-06-20 ENCOUNTER — Other Ambulatory Visit: Payer: Self-pay

## 2021-06-20 ENCOUNTER — Encounter: Payer: Self-pay | Admitting: Hematology and Oncology

## 2021-06-20 VITALS — BP 153/84 | HR 81 | Temp 97.3°F | Resp 18 | Ht 67.0 in | Wt 137.4 lb

## 2021-06-20 DIAGNOSIS — R040 Epistaxis: Secondary | ICD-10-CM | POA: Insufficient documentation

## 2021-06-20 DIAGNOSIS — C182 Malignant neoplasm of ascending colon: Secondary | ICD-10-CM | POA: Diagnosis not present

## 2021-06-20 DIAGNOSIS — C562 Malignant neoplasm of left ovary: Secondary | ICD-10-CM | POA: Diagnosis not present

## 2021-06-20 DIAGNOSIS — I10 Essential (primary) hypertension: Secondary | ICD-10-CM

## 2021-06-20 DIAGNOSIS — Z5112 Encounter for antineoplastic immunotherapy: Secondary | ICD-10-CM | POA: Diagnosis not present

## 2021-06-20 MED ORDER — ATENOLOL 25 MG PO TABS: 25.0000 mg | ORAL_TABLET | Freq: Every day | ORAL | 3 refills | Status: DC

## 2021-06-20 NOTE — Assessment & Plan Note (Signed)
That was precipitated by hypertension Has stopped bleeding I recommend aggressive blood pressure control

## 2021-06-20 NOTE — Assessment & Plan Note (Signed)
She has poorly controlled hypertension likely precipitating recent nosebleed I recommend addition of atenolol She is reminded to check her blood pressure regularly and I will review her blood pressure again in her next visit As above, I plan to reduce the dose of Avastin to 10 mg/kg every 3 weeks

## 2021-06-20 NOTE — Assessment & Plan Note (Signed)
I have reviewed CT imaging with the patient She has stable disease control Her recent treatment was complicated by nosebleed and uncontrolled hypertension Her plan to reduce the dose of Avastin in the future to 10 mg/kg I will see her again in 2 weeks for further follow-up

## 2021-06-20 NOTE — Progress Notes (Signed)
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 [facility]) I have reviewed CT imaging with the patient She has stable disease control Her recent treatment was complicated by nosebleed and uncontrolled hypertension Her plan to reduce the dose of Avastin in the future to 10 mg/kg I will see her again in 2 weeks for further follow-up  Essential hypertension She has poorly controlled hypertension likely precipitating recent nosebleed I recommend addition of atenolol She is reminded to check her blood pressure regularly and I will review her blood pressure again in her next visit As above, I plan to reduce the dose of Avastin to 10 mg/kg every 3 weeks  Cancer of right colon ([place]) There are no signs of recurrent colon cancer The patient has declined colonoscopy in the past Continue serial imaging study to monitor  Epistaxis That was precipitated by hypertension Has stopped bleeding I recommend aggressive blood pressure control  No orders of the defined types were placed in this encounter.   All questions were answered. The patient knows to call the clinic with any problems, questions or concerns. The total time spent in the appointment was 30 minutes encounter with patients including review of chart and various tests results, discussions about plan of care and coordination of care plan   Heath Lark, MD 06/20/2021 1:30 PM  INTERVAL HISTORY: Please see below for problem oriented charting. She returns for further follow-up and review of imaging study She had recent nosebleed but has stopped at home She  has uncontrolled hypertension at home Her appetite is fair but she claims she is not eating well especially with meat She denies other forms of bleeding Denies abdominal pain no changes in bowel habits Her chronic pain is stable  SUMMARY OF ONCOLOGIC HISTORY: Oncology History Overview Note  BRCA1 positive   Cancer of right colon ([place])  09/16/2014 Imaging   CT abd/pel:  a 3.8cm cecal mass with associated ileocecal intussusception, and a 9.1cm solid and cystic mass in the left pelvis,    09/18/2014 Surgery   right hemicolectomy and terminal ileoectomy    09/18/2014 Pathologic Stage   pT3pN1Mx, tumor extends into pericolonic soft tissue and is less than 34m from the serosal surface, LVI 8-), PNI (-), all 23 node negative, one soft tissue tumor deposit, surgical margins negative.     09/18/2014 Initial Diagnosis   Adenocarcinoma of right colon    08/06/2016 Imaging   CT chest/abd/pelvis with contrast IMPRESSION: 1. No recurrent malignancy identified. 2. 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 On the prior exam from 09/13/2015 there was back flow of contrast in the left external iliac artery to supply the left internal iliac artery ; this back flow of contrast is no longer present in the left external iliac artery is occluded. 3. Several pulmonary nodules at or below 4 mm diameter, unchanged. 4. 3 mm hypodensity posteriorly in the body of the pancreas, not well seen previously and subtle today, likely incidental, but merits observation. 5. 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 Prominent degenerative spurring of both acetabula. 6. Lumbar spondylosis and degenerative disc disease causing prominent  impingement at L4-5 and mild impingement at L5-S1.   03/08/2017 Imaging   CT CAP W Contrast 03/08/17 IMPRESSION: Chest Impression: 1. Stable small pulmonary nodules within LEFT lung. 2. No  mediastinal lymphadenopathy.  Abdomen / Pelvis Impression: 1. No evidence of metastatic colorectal cancer or ovarian cancer within the abdomen pelvis. 2. No lymphadenopathy. 3. Postsurgical change consistent RIGHT hemicolectomy and hysterectomy 4. A fem-fem arterial bypass noted.   11/13/2017 Imaging   CT AP w contrast IMPRESSION: 1. No findings of recurrent malignancy in the abdomen or pelvis. 2. Other imaging findings of potential clinical significance:Chronic AVN of both femoral heads without contour abnormality. Lumbar impingement at L4-5 and L5-S1. Aortic Atherosclerosis (ICD10-I70.0). 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 Partial right hemicolectomy. Hysterectomy.   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 This was discovered during her workup for colon cancer.    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 Tumor size 3.7 cm, no lymph nodes removed. T1cNx. Tumor cells are positive for cytokeratin 7 and estrogen receptor.    11/01/2014 Surgery   B/l Salpingo-oophorectomy.   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 Stable small pulmonary nodules within LEFT lung. 2. No mediastinal lymphadenopathy.  Abdomen / Pelvis Impression:  1. No evidence of metastatic colorectal cancer or ovarian cancer within the abdomen pelvis. 2. No lymphadenopathy. 3. Postsurgical change consistent RIGHT hemicolectomy and hysterectomy 4. A  fem-fem arterial bypass noted.   03/12/2017 Mammogram   MM Right Breast 03/12/17 IMPRESSION: No mammographic or sonographic evidence of malignancy. No correlate identified for the abnormal enhancement seen within the right nipple on MRI of 02/27/2017.    04/02/2017 Pathology Results   Surgical Pathology of Nipple Biopsy: 04/02/17 Diagnosis Nipple Biopsy, Right - BENIGN NIPPLE TISSUE. - NO ATYPIA OR MALIGNANCY.    11/13/2017 Imaging   1. No findings of recurrent malignancy in the abdomen or pelvis. 2. Other imaging findings of potential clinical significance: Chronic AVN of both femoral heads without contour abnormality. Lumbar impingement at L4-5 and L5-S1. Aortic Atherosclerosis (ICD10-I70.0). 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 Partial right hemicolectomy. Hysterectomy.   03/22/2018 Mammogram   Screening mammogram IMPRESSION No evidence of malignancy.      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   06/06/2019 Imaging   1. Tiny bilateral pulmonary nodules stable since 11/12/2018. Continued close attention on follow-up recommended. 2. Incompletely visualized upper abdominal lymphadenopathy better characterized on the abdomen CT from 2 weeks ago. 3.  Aortic Atherosclerois (ICD10-170.0) 4.  Emphysema. ([id number]-R75.9)   06/07/2019 Procedure   Technically successful CT guided core needle biopsy of indeterminate peritoneal nodule within the right lower abdomen/pelvis.  06/07/2019 Pathology Results   Soft Tissue Needle Core Biopsy, peritoneal nodule within right lower abdomen - METASTATIC CARCINOMA CONSISTENT WITH PATIENT'S CLINICAL HISTORY OF PRIMARY OVARIAN CARCINOMA. SEE NOTE Diagnosis  Note Immunohistochemical stains show that the tumor cells are positive for CK7, ER and PAX 8; and negative for CK20 and CDX2. This immunostaining profile is consistent with above diagnosis.   06/16/2019 Tumor Marker   Patient's tumor was tested for the following markers: CA-125 Results of the tumor marker test revealed 499   06/20/2019 - 11/14/2019 Chemotherapy   The patient had carboplatin and taxol   07/11/2019 Tumor Marker   Patient's tumor was tested for the following markers: CA-125 Results of the tumor marker test revealed 438   08/05/2019 Tumor Marker   Patient's tumor was tested for the following markers: CA-125 Results of the tumor marker test revealed 272.   08/25/2019 Imaging   Ct chest, abdomen and pelvis 1. Slight interval decrease in size of a right lower quadrant peritoneal nodule measuring 1.7 x 1.6 cm, previously 2.2 x 2.2 cm (series 2, image 94, series 5, image 58). Findings are consistent with treatment response.   2. No significant change in ill-defined soft tissue about the sigmoid colon in the hysterectomy bed (series 2, image 101).   3.  Status post hysterectomy.   4. No evidence of metastatic disease in the chest. Stable benign small pulmonary nodules.   5.  Coronary artery disease.  Aortic Atherosclerosis (ICD10-I70.0).     08/26/2019 Tumor Marker   Patient's tumor was tested for the following markers: CA-125 Results of the tumor marker test revealed 189.   09/26/2019 Tumor Marker   Patient's tumor was tested for the following markers: CA-125 Results of the tumor marker test revealed 137   10/03/2019 Tumor Marker   Patient's tumor was tested for the following markers: CA-125 Results of the tumor marker test revealed 137.   10/31/2019 Tumor Marker   Patient's tumor was tested for the following markers: CA-125 Results of the tumor marker test revealed 101   11/29/2019 Imaging   1. Right lower quadrant peritoneal implant is decreased. Stable irregular  nodular pelvic peritoneal thickening superior to the vaginal cuff. 2. No new or progressive metastatic disease. No abdominopelvic lymphadenopathy. 3.  Aortic Atherosclerosis (ICD10-I70.0).   12/15/2019 -  Chemotherapy   The patient had Lynparza for chemotherapy treatment.     12/23/2019 Tumor Marker   Patient's tumor was tested for the following markers: CA-125 Results of the tumor marker test revealed 55.9   01/02/2020 Tumor Marker   Patient's tumor was tested for the following markers: CA-125. Results of the tumor marker test revealed 58.6   02/03/2020 Tumor Marker   Patient's tumor was tested for the following markers: CA-125 Results of the tumor marker test revealed 52.2   02/24/2020 Tumor Marker   Patient's tumor was tested for the following markers: CA-125 Results of the tumor marker test revealed 64.8   04/19/2020 Imaging   1. Continued slight interval decrease in size of a soft tissue nodule in the right paracolic gutter, consistent with treatment response. 2. Soft tissue in the hysterectomy bed near the distal sigmoid colon and rectum may be slightly decreased in size, difficult to measure due to configuration but apparently diminished in size, also consistent with treatment response. 3. Status post hysterectomy and oophorectomy as well as right colon resection. 4. No evidence of new metastatic disease in the abdomen or pelvis. 5. Redemonstrated long segment occlusion of the left common,  internal, and external iliac arteries. Status post femoral-femoral bypass grafting with reconstitution of the left common femoral artery. Aortic Atherosclerosis (ICD10-I70.0).   08/15/2020 Tumor Marker   Patient's tumor was tested for the following markers: CA-125 Results of the tumor marker test revealed 47.1   10/22/2020 Imaging   1. Reduced size of the nodule along the right paracolic gutter, currently 0.6 by 1.2 cm, previously 0.7 by 1.4 cm on 04/19/2020 and previously 0.9 by 1.5 cm on  11/29/2019. Appearance compatible with treatment response. 2. Further mild reduction in size of the bandlike density along the rectosigmoid mesentery, probably from scarring or effectively treated tumor. 3. Prominent stool throughout the colon favors constipation.  4. Stable chronic occlusion of the left iliac arteries, with a patent fem-fem bypass. 5. 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 6. Lower lumbar spondylosis and degenerative disc disease causing impingement at L4-5 and L5-S1. 7. Aortic atherosclerosis.     10/23/2020 Tumor Marker   Patient's tumor was tested for the following markers: CA-125 Results of the tumor marker test revealed 74.6   01/21/2021 Tumor Marker   Patient's tumor was tested for the following markers: CA-125 Results of the tumor marker test revealed 825   01/28/2021 Imaging   1. Changes of right hemicolectomy with slightly decreased size of the nodule along the right pericolic gutter and similar band like density along the rectosigmoid mesentery. 2. Two new well-circumscribed low-density nodules in the presacral perirectal space, difficult to ascertain whether they arise from the bowel wall or arise from within the peritoneum. This is a nonspecific finding which could be further evaluated with colonoscopy, repeat CT with rectal contrast or attention on close interval follow-up imaging in 3 months. 3. 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 4. 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 5. Emphysema and aortic atherosclerosis. Aortic Atherosclerosis (ICD10-I70.0) and Emphysema (ICD10-J43.9).   02/08/2021 Procedure   Successful placement of a right internal jugular approach power injectable Port-A-Cath. The catheter is ready for immediate use.     02/11/2021 - 06/06/2021 Chemotherapy          02/18/2021 Tumor Marker    Patient's tumor was tested for the following markers: CA-125 Results of the tumor marker test revealed 852   03/04/2021 Tumor Marker   Patient's tumor was tested for the following markers: CA-125. Results of the tumor marker test revealed 541   03/11/2021 Tumor Marker   Patient's tumor was tested for the following markers: CA-125 Results of the tumor marker test revealed 568   03/25/2021 Tumor Marker   Patient's tumor was tested for the following markers:CA-125 Results of the tumor marker test revealed 367.   04/08/2021 Tumor Marker   Patient's tumor was tested for the following markers: CA-125 Results of the tumor marker test revealed 273   04/29/2021 Tumor Marker   Patient's tumor was tested for the following markers: CA-125 Results of the tumor marker test revealed 431   06/06/2021 Tumor Marker   Patient's tumor was tested for the following markers: CA-125. Results of the tumor marker test revealed 594.   06/18/2021 Imaging   1. Nodular fascial thickening with subtle increase along the RIGHT pericolic gutter as described. Short interval follow-up may be helpful. 2. Stable appearance of fascial thickening and serosal thickening in the pelvis, potentially indicative of peritoneal disease. 3. Post RIGHT hemicolectomy. 4. Stable chronic occlusion of LEFT iliac  vasculature with patent femoral femoral bypass grafting. 5. 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 6. Aortic atherosclerosis.   Aortic Atherosclerosis (ICD10-I70.0).       REVIEW OF SYSTEMS:   Constitutional: Denies fevers, chills or abnormal weight loss Eyes: Denies blurriness of vision Ears, nose, mouth, throat, and face: Denies mucositis or sore throat Respiratory: Denies cough, dyspnea or wheezes Cardiovascular: Denies palpitation, chest discomfort or lower extremity swelling Gastrointestinal:  Denies nausea, heartburn or change in bowel habits Skin: Denies abnormal skin rashes Lymphatics:  Denies new lymphadenopathy or easy bruising Neurological:Denies numbness, tingling or new weaknesses Behavioral/Psych: Mood is stable, no new changes  All other systems were reviewed with the patient and are negative.  I have reviewed the past medical history, past surgical history, social history and family history with the patient and they are unchanged from previous note.  ALLERGIES:  is allergic to other, demerol [meperidine], morphine and related, oxycodone, penicillins, strawberry flavor, tylenol [acetaminophen], and ibuprofen.  MEDICATIONS:  Current Outpatient Medications  Medication Sig Dispense Refill   atenolol (TENORMIN) 25 MG tablet Take 1 tablet (25 mg total) by mouth daily. 30 tablet 3   acetaminophen (TYLENOL) 325 MG tablet Take 325 mg by mouth as needed.     amLODipine (NORVASC) 10 MG tablet Take 1 tablet (10 mg total) by mouth daily. 30 tablet 3   docusate sodium 100 MG CAPS Take 100 mg by mouth 2 (two) times daily as needed for mild constipation. 10 capsule 0   HYDROcodone-acetaminophen (NORCO/VICODIN) 5-325 MG tablet Take 1 tablet by mouth every 6 (six) hours as needed for moderate pain. 90 tablet 0   lidocaine (LIDODERM) 5 % PLACE 1 PATCH ONTO THE SKIN EVERY DAY REMOVE AND DISCARD WITHIN 12 HOURS OR AS DIRECTED BY PRESCRIBER 90 patch 1   lidocaine-prilocaine (EMLA) cream Apply to affected area once 30 g 3   losartan (COZAAR) 50 MG tablet Take 1 tablet (50 mg total) by mouth daily. 90 tablet 3   ondansetron (ZOFRAN) 8 MG tablet Take 1 tablet (8 mg total) by mouth every 8 (eight) hours as needed. Start on the third day after chemotherapy. 30 tablet 1   pregabalin (LYRICA) 200 MG capsule Take 1 capsule (200 mg total) by mouth every 8 (eight) hours. Please give patient brand name Lyrica 90 capsule 4   prochlorperazine (COMPAZINE) 10 MG tablet Take 1 tablet (10 mg total) by mouth every 6 (six) hours as needed (Nausea or vomiting). 30 tablet 1   No current facility-administered  medications for this visit.    PHYSICAL EXAMINATION: ECOG PERFORMANCE STATUS: 1 - Symptomatic but completely ambulatory  Vitals:   06/20/21 1313  BP: (!) 153/84  Pulse: 81  Resp: 18  Temp: (!) 97.3 F (36.3 C)  SpO2: 100%   Filed Weights   06/20/21 1313  Weight: 137 lb 6.4 oz (62.3 kg)    GENERAL:alert, no distress and comfortable Musculoskeletal:no cyanosis of digits and no clubbing  NEURO: alert & oriented x 3 with fluent speech, no focal motor/sensory deficits  LABORATORY DATA:  I have reviewed the data as listed    Component Value Date/Time   NA 141 06/06/2021 0901   NA 141 10/07/2017 1436   K 4.0 06/06/2021 0901   K 4.4 10/07/2017 1436   CL 105 06/06/2021 0901   CO2 27 06/06/2021 0901   CO2 26 10/07/2017 1436   GLUCOSE 96 06/06/2021 0901   GLUCOSE 85 10/07/2017 1436   BUN 18 06/06/2021  0901   BUN 14.9 10/07/2017 1436   CREATININE 0.76 06/06/2021 0901   CREATININE 1.0 10/07/2017 1436   CALCIUM 9.6 06/06/2021 0901   CALCIUM 9.5 10/07/2017 1436   PROT 7.2 06/06/2021 0901   PROT 7.8 10/07/2017 1436   ALBUMIN 4.0 06/06/2021 0901   ALBUMIN 3.9 10/07/2017 1436   AST 17 06/06/2021 0901   AST 15 10/07/2017 1436   ALT 8 06/06/2021 0901   ALT 7 10/07/2017 1436   ALKPHOS 62 06/06/2021 0901   ALKPHOS 81 10/07/2017 1436   BILITOT 0.4 06/06/2021 0901   BILITOT 0.54 10/07/2017 1436   GFRNONAA >60 06/06/2021 0901   GFRAA >60 08/15/2020 1121    No results found for: SPEP, UPEP  Lab Results  Component Value Date   WBC 5.4 06/06/2021   NEUTROABS 1.6 (L) 06/06/2021   HGB 12.3 06/06/2021   HCT 34.7 (L) 06/06/2021   MCV 105.5 (H) 06/06/2021   PLT 101 (L) 06/06/2021      Chemistry      Component Value Date/Time   NA 141 06/06/2021 0901   NA 141 10/07/2017 1436   K 4.0 06/06/2021 0901   K 4.4 10/07/2017 1436   CL 105 06/06/2021 0901   CO2 27 06/06/2021 0901   CO2 26 10/07/2017 1436   BUN 18 06/06/2021 0901   BUN 14.9 10/07/2017 1436   CREATININE 0.76  06/06/2021 0901   CREATININE 1.0 10/07/2017 1436      Component Value Date/Time   CALCIUM 9.6 06/06/2021 0901   CALCIUM 9.5 10/07/2017 1436   ALKPHOS 62 06/06/2021 0901   ALKPHOS 81 10/07/2017 1436   AST 17 06/06/2021 0901   AST 15 10/07/2017 1436   ALT 8 06/06/2021 0901   ALT 7 10/07/2017 1436   BILITOT 0.4 06/06/2021 0901   BILITOT 0.54 10/07/2017 1436       RADIOGRAPHIC STUDIES: I have reviewed serial imaging studies with the patient I have personally reviewed the radiological images as listed and agreed with the findings in the report. CT ABDOMEN PELVIS W CONTRAST  Result Date: 06/18/2021 CLINICAL DATA:  Ovarian cancer, assess treatment response in a 60 year old female. Previously shown to have areas adjacent to the colon, rectosigmoid colon in the pelvis. Previous imaging suggested short interval follow-up. EXAM: CT ABDOMEN AND PELVIS WITH CONTRAST TECHNIQUE: Multidetector CT imaging of the abdomen and pelvis was performed using the standard protocol following bolus administration of intravenous contrast. CONTRAST:  157m OMNIPAQUE IOHEXOL 300 MG/ML  SOLN COMPARISON:  January 28, 2021. FINDINGS: Lower chest: Lung bases are clear. The central venous access device terminates in the RIGHT atrium incompletely imaged. Hepatobiliary: No focal, suspicious hepatic lesion. The portal vein is patent. No pericholecystic stranding. No biliary duct dilation. Pancreas: Normal, without mass, inflammation or ductal dilatation. Spleen: Spleen normal size and contour. Adrenals/Urinary Tract: Adrenal glands are normal. Symmetric renal enhancement with mild cortical scarring bilaterally. No hydronephrosis. No suspicious renal lesion. Urinary bladder is under distended, no signs of acute finding related to the urinary bladder. Stomach/Bowel: No acute gastrointestinal process. Post RIGHT hemicolectomy. Subtle nodularity along the colon in the RIGHT abdomen (image 47/2) 6 mm thickness, slightly increased in  conspicuity when compared to the most recent study of January 28, 2021 where at a similar location this measured approximately 4-5 mm at most. Subtle nodularity and haziness within the fat along the RIGHT colon also slightly increased in conspicuity. Subcentimeter area of measurable nodularity on image 39 of series 2 at 6 mm. Some infiltration of  the fat on the previous study at this location without measurable nodule. Fascial thickening along the RIGHT lateral conal fascia. Fascial thickening in the pelvis and along the serosal surface of the rectosigmoid junction, maximal thickness approximately 8 mm as compared to 9 mm on the previous study. Fascial thickening tracking along the LEFT pelvic sidewall at 4 mm (image 59/2) this is contiguous with serosal thickening along the anterior margin of the rectosigmoid. This process is largely similar compared to the prior exam Cystic area and nodularity to the RIGHT of the rectum is not visualized as a discrete abnormality though there is some soft tissue amidst the folds of the sigmoid on image 58 of series 2 measuring approximately 0.9 by 0.8 cm. This is in the area of the previously described lower density 11 mm focus that was seen on the prior study. Vascular/Lymphatic: Calcified and noncalcified atheromatous plaque of the abdominal aorta. Occluded LEFT iliac vasculature with patent femoral femoral bypass graft. Narrowing or chronic occlusion of the LEFT renal vein with collateral pathways in the LEFT retroperitoneum similar to previous imaging. There is no gastrohepatic or hepatoduodenal ligament lymphadenopathy. No retroperitoneal or mesenteric lymphadenopathy. No pelvic sidewall lymphadenopathy. Reproductive: Post hysterectomy.  No adnexal mass. Other: No ascites. Fascial thickening and nodularity adjacent to the colon and rectum as described. Musculoskeletal: No acute musculoskeletal process. Spinal degenerative changes. Signs of avascular necrosis of bilateral femoral  heads as before. IMPRESSION: 1. Nodular fascial thickening with subtle increase along the RIGHT pericolic gutter as described. Short interval follow-up may be helpful. 2. Stable appearance of fascial thickening and serosal thickening in the pelvis, potentially indicative of peritoneal disease. 3. Post RIGHT hemicolectomy. 4. Stable chronic occlusion of LEFT iliac vasculature with patent femoral femoral bypass grafting. 5. 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 6. Aortic atherosclerosis. Aortic Atherosclerosis (ICD10-I70.0). Electronically Signed   By: Zetta Bills M.D.   On: 06/18/2021 11:32

## 2021-06-20 NOTE — Assessment & Plan Note (Signed)
There are no signs of recurrent colon cancer The patient has declined colonoscopy in the past Continue serial imaging study to monitor

## 2021-06-25 NOTE — Progress Notes (Signed)
The following biosimilar Mvasi ([id number]) has been selected for use in this patient required by insurance  Henreitta Leber, PharmD 06/25/21 @ [phone]

## 2021-06-25 NOTE — Progress Notes (Signed)
Pharmacist Chemotherapy Monitoring - Initial Assessment    Anticipated start date: 07/02/21   The following has been reviewed per standard work regarding the patient's treatment regimen: The patient's diagnosis, treatment plan and drug doses, and organ/hematologic function Lab orders and baseline tests specific to treatment regimen  The treatment plan start date, drug sequencing, and pre-medications Prior authorization status  Patient's documented medication list, including drug-drug interaction screen and prescriptions for anti-emetics and supportive care specific to the treatment regimen The drug concentrations, fluid compatibility, administration routes, and timing of the medications to be used The patient's access for treatment and lifetime cumulative dose history, if applicable  The patient's medication allergies and previous infusion related reactions, if applicable   Changes made to treatment plan:  N/A  Follow up needed:  Pending authorization for treatment    Wynona Neat, [facility], 06/25/2021  12:44 PM

## 2021-06-27 ENCOUNTER — Ambulatory Visit: Payer: 59 | Admitting: Family

## 2021-07-02 ENCOUNTER — Encounter: Payer: Self-pay | Admitting: Hematology and Oncology

## 2021-07-02 ENCOUNTER — Other Ambulatory Visit: Payer: Self-pay

## 2021-07-02 ENCOUNTER — Inpatient Hospital Stay: Payer: 59

## 2021-07-02 ENCOUNTER — Inpatient Hospital Stay: Payer: 59 | Attending: Hematology and Oncology | Admitting: Hematology and Oncology

## 2021-07-02 DIAGNOSIS — Z5112 Encounter for antineoplastic immunotherapy: Secondary | ICD-10-CM | POA: Insufficient documentation

## 2021-07-02 DIAGNOSIS — Z85038 Personal history of other malignant neoplasm of large intestine: Secondary | ICD-10-CM | POA: Insufficient documentation

## 2021-07-02 DIAGNOSIS — I10 Essential (primary) hypertension: Secondary | ICD-10-CM

## 2021-07-02 DIAGNOSIS — Z5111 Encounter for antineoplastic chemotherapy: Secondary | ICD-10-CM | POA: Diagnosis present

## 2021-07-02 DIAGNOSIS — G893 Neoplasm related pain (acute) (chronic): Secondary | ICD-10-CM | POA: Insufficient documentation

## 2021-07-02 DIAGNOSIS — Z90710 Acquired absence of both cervix and uterus: Secondary | ICD-10-CM | POA: Diagnosis not present

## 2021-07-02 DIAGNOSIS — C562 Malignant neoplasm of left ovary: Secondary | ICD-10-CM | POA: Diagnosis present

## 2021-07-02 DIAGNOSIS — Z9221 Personal history of antineoplastic chemotherapy: Secondary | ICD-10-CM | POA: Insufficient documentation

## 2021-07-02 DIAGNOSIS — D61818 Other pancytopenia: Secondary | ICD-10-CM | POA: Diagnosis not present

## 2021-07-02 DIAGNOSIS — C182 Malignant neoplasm of ascending colon: Secondary | ICD-10-CM

## 2021-07-02 DIAGNOSIS — Z79899 Other long term (current) drug therapy: Secondary | ICD-10-CM | POA: Diagnosis not present

## 2021-07-02 DIAGNOSIS — G620 Drug-induced polyneuropathy: Secondary | ICD-10-CM | POA: Insufficient documentation

## 2021-07-02 LAB — CMP (CANCER CENTER ONLY)
ALT: 13 U/L (ref 0–44)
AST: 19 U/L (ref 15–41)
Albumin: 3.6 g/dL (ref 3.5–5.0)
Alkaline Phosphatase: 71 U/L (ref 38–126)
Anion gap: 6 (ref 5–15)
BUN: 22 mg/dL — ABNORMAL HIGH (ref 6–20)
CO2: 26 mmol/L (ref 22–32)
Calcium: 9.1 mg/dL (ref 8.9–10.3)
Chloride: 109 mmol/L (ref 98–111)
Creatinine: 0.76 mg/dL (ref 0.44–1.00)
GFR, Estimated: >60 mL/min (ref >60)
Glucose, Bld: 86 mg/dL (ref 70–99)
Potassium: 4.4 mmol/L (ref 3.5–5.1)
Sodium: 141 mmol/L (ref 135–145)
Total Bilirubin: 0.5 mg/dL (ref 0.3–1.2)
Total Protein: 6.4 g/dL — ABNORMAL LOW (ref 6.5–8.1)

## 2021-07-02 LAB — CBC WITH DIFFERENTIAL (CANCER CENTER ONLY)
Abs Immature Granulocytes: 0 10*3/uL (ref 0.00–0.07)
Basophils Absolute: 0 10*3/uL (ref 0.0–0.1)
Basophils Relative: 0 %
Eosinophils Absolute: 0 10*3/uL (ref 0.0–0.5)
Eosinophils Relative: 0 %
HCT: 28.4 % — ABNORMAL LOW (ref 36.0–46.0)
Hemoglobin: 9.8 g/dL — ABNORMAL LOW (ref 12.0–15.0)
Immature Granulocytes: 0 %
Lymphocytes Relative: 61 %
Lymphs Abs: 2.9 10*3/uL (ref 0.7–4.0)
MCH: 36.7 pg — ABNORMAL HIGH (ref 26.0–34.0)
MCHC: 34.5 g/dL (ref 30.0–36.0)
MCV: 106.4 fL — ABNORMAL HIGH (ref 80.0–100.0)
Monocytes Absolute: 0.5 10*3/uL (ref 0.1–1.0)
Monocytes Relative: 9 %
Neutro Abs: 1.5 10*3/uL — ABNORMAL LOW (ref 1.7–7.7)
Neutrophils Relative %: 30 %
Platelet Count: 64 10*3/uL — ABNORMAL LOW (ref 150–400)
RBC: 2.67 MIL/uL — ABNORMAL LOW (ref 3.87–5.11)
RDW: 15.6 % — ABNORMAL HIGH (ref 11.5–15.5)
WBC Count: 4.9 10*3/uL (ref 4.0–10.5)
nRBC: 0 % (ref 0.0–0.2)

## 2021-07-02 LAB — TOTAL PROTEIN, URINE DIPSTICK: Protein, ur: 300 mg/dL — AB

## 2021-07-02 MED ORDER — LOSARTAN POTASSIUM 50 MG PO TABS
50.0000 mg | ORAL_TABLET | Freq: Every day | ORAL | 3 refills | Status: DC
Start: 2021-07-02 — End: 2021-07-26

## 2021-07-02 MED ORDER — DOXAZOSIN MESYLATE 2 MG PO TABS: 2.0000 mg | ORAL_TABLET | Freq: Every day | ORAL | 3 refills | Status: DC

## 2021-07-02 MED ORDER — SODIUM CHLORIDE 0.9% FLUSH
10.0000 mL | Freq: Once | INTRAVENOUS | Status: AC
Start: 2021-07-02 — End: 2021-07-02
  Administered 2021-07-02: 10 mL
  Filled 2021-07-02: qty 10

## 2021-07-02 NOTE — Assessment & Plan Note (Signed)
This is due to her recent treatment She is not symptomatic She does not need transfusion support Observe closely

## 2021-07-02 NOTE — Progress Notes (Signed)
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 [facility]) Unfortunately, she developed heavy proteinuria and uncontrolled hypertension from recent treatment with bevacizumab We will put her treatment on hold today I recommend addition of Cardura I have we will reschedule her treatment for 2 weeks  Pancytopenia, acquired ([employer]) This is due to her recent treatment She is not symptomatic She does not need transfusion support Observe closely  Essential hypertension She has uncontrolled hypertension and proteinuria I recommend the addition of Cardura I will reassess blood pressure control in her next visit  Cancer associated pain She has multifactorial pain Her pain is well controlled She will continue prescribed pain medicine and Lyrica  No orders of the defined types were placed in this encounter.   All questions were answered. The patient knows to call the clinic with any problems, questions or concerns. The total time spent in the appointment was 30 minutes encounter with patients including review of chart and various tests results, discussions about plan of care and coordination of care plan   Heath Lark, MD 07/02/2021 4:27 PM  INTERVAL HISTORY: Please see below for problem oriented charting. She returns for further follow-up She stated her blood pressure at home were reasonably controlled Her blood pressure is high today but she which she attributed to anxiety She denies recent headache No recent epistaxis  SUMMARY OF ONCOLOGIC HISTORY: Oncology History Overview Note  BRCA1 positive    Cancer of right colon ([place])  09/16/2014 Imaging   CT abd/pel:  a 3.8cm cecal mass with associated ileocecal intussusception, and a 9.1cm solid and cystic mass in the left pelvis,    09/18/2014 Surgery   right hemicolectomy and terminal ileoectomy    09/18/2014 Pathologic Stage   pT3pN1Mx, tumor extends into pericolonic soft tissue and is less than 59m from the serosal surface, LVI 8-), PNI (-), all 23 node negative, one soft tissue tumor deposit, surgical margins negative.     09/18/2014 Initial Diagnosis   Adenocarcinoma of right colon    08/06/2016 Imaging   CT chest/abd/pelvis with contrast IMPRESSION: 1. No recurrent malignancy identified. 2. 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 On the prior exam from 09/13/2015 there was back flow of contrast in the left external iliac artery to supply the left internal iliac artery ; this back flow of contrast is no longer present in the left external iliac artery is occluded. 3. Several pulmonary nodules at or below 4 mm diameter, unchanged. 4. 3 mm hypodensity posteriorly in the body of the pancreas, not well seen previously and subtle today, likely incidental, but merits observation. 5. 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 Prominent degenerative spurring of both acetabula. 6. Lumbar spondylosis and degenerative disc disease causing prominent impingement at L4-5 and mild impingement at L5-S1.   03/08/2017 Imaging   CT CAP W Contrast 03/08/17 IMPRESSION: Chest Impression: 1. Stable small pulmonary nodules within LEFT lung. 2. No mediastinal lymphadenopathy.  Abdomen / Pelvis Impression: 1. No evidence of metastatic colorectal cancer or ovarian cancer within the abdomen pelvis. 2. No lymphadenopathy. 3. Postsurgical change consistent RIGHT hemicolectomy and hysterectomy 4.  A fem-fem arterial bypass noted.   11/13/2017 Imaging   CT AP w  contrast IMPRESSION: 1. No findings of recurrent malignancy in the abdomen or pelvis. 2. Other imaging findings of potential clinical significance:Chronic AVN of both femoral heads without contour abnormality. Lumbar impingement at L4-5 and L5-S1. Aortic Atherosclerosis (ICD10-I70.0). 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 Partial right hemicolectomy. Hysterectomy.   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 This was discovered during her workup for colon cancer.    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 Tumor size 3.7 cm, no lymph nodes removed. T1cNx. Tumor cells are positive for cytokeratin 7 and estrogen receptor.    11/01/2014 Surgery   B/l Salpingo-oophorectomy.   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 Stable small pulmonary nodules within LEFT lung. 2. No mediastinal lymphadenopathy.  Abdomen / Pelvis Impression:  1. No evidence of metastatic colorectal cancer or ovarian cancer within the abdomen pelvis. 2. No lymphadenopathy. 3. Postsurgical change consistent RIGHT hemicolectomy and hysterectomy 4. A fem-fem arterial bypass noted.   03/12/2017 Mammogram   MM Right Breast 03/12/17 IMPRESSION: No mammographic or sonographic evidence of malignancy. No correlate identified for the abnormal enhancement seen within the right nipple on MRI of 02/27/2017.    04/02/2017 Pathology Results   Surgical Pathology of Nipple  Biopsy: 04/02/17 Diagnosis Nipple Biopsy, Right - BENIGN NIPPLE TISSUE. - NO ATYPIA OR MALIGNANCY.    11/13/2017 Imaging   1. No findings of recurrent malignancy in the abdomen or pelvis. 2. Other imaging findings of potential clinical significance: Chronic AVN of both femoral heads without contour abnormality. Lumbar impingement at L4-5 and L5-S1. Aortic Atherosclerosis (ICD10-I70.0). 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 Partial right hemicolectomy. Hysterectomy.   03/22/2018 Mammogram   Screening mammogram IMPRESSION No evidence of malignancy.      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   06/06/2019 Imaging   1. Tiny bilateral pulmonary nodules stable since 11/12/2018. Continued close attention on follow-up recommended. 2. Incompletely visualized upper abdominal lymphadenopathy better characterized on the abdomen CT from 2 weeks ago. 3.  Aortic Atherosclerois (ICD10-170.0) 4.  Emphysema. ([id number]-M84.9)   06/07/2019 Procedure   Technically successful CT guided core needle biopsy of indeterminate peritoneal nodule within the right lower abdomen/pelvis.   06/07/2019 Pathology Results   Soft Tissue Needle Core Biopsy, peritoneal nodule within right lower abdomen - METASTATIC CARCINOMA CONSISTENT WITH PATIENT'S CLINICAL HISTORY OF PRIMARY OVARIAN CARCINOMA. SEE NOTE Diagnosis Note Immunohistochemical stains show that the tumor cells are positive for CK7, ER and PAX 8; and negative for CK20 and CDX2. This immunostaining profile is consistent with above diagnosis.   06/16/2019 Tumor  Marker   Patient's tumor was tested for the following markers: CA-125 Results of the tumor marker test revealed  499   06/20/2019 - 11/14/2019 Chemotherapy   The patient had carboplatin and taxol   07/11/2019 Tumor Marker   Patient's tumor was tested for the following markers: CA-125 Results of the tumor marker test revealed 438   08/05/2019 Tumor Marker   Patient's tumor was tested for the following markers: CA-125 Results of the tumor marker test revealed 272.   08/25/2019 Imaging   Ct chest, abdomen and pelvis 1. Slight interval decrease in size of a right lower quadrant peritoneal nodule measuring 1.7 x 1.6 cm, previously 2.2 x 2.2 cm (series 2, image 94, series 5, image 58). Findings are consistent with treatment response.   2. No significant change in ill-defined soft tissue about the sigmoid colon in the hysterectomy bed (series 2, image 101).   3.  Status post hysterectomy.   4. No evidence of metastatic disease in the chest. Stable benign small pulmonary nodules.   5.  Coronary artery disease.  Aortic Atherosclerosis (ICD10-I70.0).     08/26/2019 Tumor Marker   Patient's tumor was tested for the following markers: CA-125 Results of the tumor marker test revealed 189.   09/26/2019 Tumor Marker   Patient's tumor was tested for the following markers: CA-125 Results of the tumor marker test revealed 137   10/03/2019 Tumor Marker   Patient's tumor was tested for the following markers: CA-125 Results of the tumor marker test revealed 137.   10/31/2019 Tumor Marker   Patient's tumor was tested for the following markers: CA-125 Results of the tumor marker test revealed 101   11/29/2019 Imaging   1. Right lower quadrant peritoneal implant is decreased. Stable irregular nodular pelvic peritoneal thickening superior to the vaginal cuff. 2. No new or progressive metastatic disease. No abdominopelvic lymphadenopathy. 3.  Aortic Atherosclerosis (ICD10-I70.0).   12/15/2019 -  Chemotherapy   The patient had Lynparza for chemotherapy treatment.     12/23/2019 Tumor Marker   Patient's tumor was  tested for the following markers: CA-125 Results of the tumor marker test revealed 55.9   01/02/2020 Tumor Marker   Patient's tumor was tested for the following markers: CA-125. Results of the tumor marker test revealed 58.6   02/03/2020 Tumor Marker   Patient's tumor was tested for the following markers: CA-125 Results of the tumor marker test revealed 52.2   02/24/2020 Tumor Marker   Patient's tumor was tested for the following markers: CA-125 Results of the tumor marker test revealed 64.8   04/19/2020 Imaging   1. Continued slight interval decrease in size of a soft tissue nodule in the right paracolic gutter, consistent with treatment response. 2. Soft tissue in the hysterectomy bed near the distal sigmoid colon and rectum may be slightly decreased in size, difficult to measure due to configuration but apparently diminished in size, also consistent with treatment response. 3. Status post hysterectomy and oophorectomy as well as right colon resection. 4. No evidence of new metastatic disease in the abdomen or pelvis. 5. Redemonstrated long segment occlusion of the left common, internal, and external iliac arteries. Status post femoral-femoral bypass grafting with reconstitution of the left common femoral artery. Aortic Atherosclerosis (ICD10-I70.0).   08/15/2020 Tumor Marker   Patient's tumor was tested for the following markers: CA-125 Results of the tumor marker test revealed 47.1   10/22/2020 Imaging   1. Reduced size of the nodule along the right paracolic gutter, currently 0.6 by  1.2 cm, previously 0.7 by 1.4 cm on 04/19/2020 and previously 0.9 by 1.5 cm on 11/29/2019. Appearance compatible with treatment response. 2. Further mild reduction in size of the bandlike density along the rectosigmoid mesentery, probably from scarring or effectively treated tumor. 3. Prominent stool throughout the colon favors constipation.  4. Stable chronic occlusion of the left iliac arteries, with a  patent fem-fem bypass. 5. 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 6. Lower lumbar spondylosis and degenerative disc disease causing impingement at L4-5 and L5-S1. 7. Aortic atherosclerosis.     10/23/2020 Tumor Marker   Patient's tumor was tested for the following markers: CA-125 Results of the tumor marker test revealed 74.6   01/21/2021 Tumor Marker   Patient's tumor was tested for the following markers: CA-125 Results of the tumor marker test revealed 825   01/28/2021 Imaging   1. Changes of right hemicolectomy with slightly decreased size of the nodule along the right pericolic gutter and similar band like density along the rectosigmoid mesentery. 2. Two new well-circumscribed low-density nodules in the presacral perirectal space, difficult to ascertain whether they arise from the bowel wall or arise from within the peritoneum. This is a nonspecific finding which could be further evaluated with colonoscopy, repeat CT with rectal contrast or attention on close interval follow-up imaging in 3 months. 3. 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 4. 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 5. Emphysema and aortic atherosclerosis. Aortic Atherosclerosis (ICD10-I70.0) and Emphysema (ICD10-J43.9).   02/08/2021 Procedure   Successful placement of a right internal jugular approach power injectable Port-A-Cath. The catheter is ready for immediate use.     02/11/2021 - 06/06/2021 Chemotherapy          02/18/2021 Tumor Marker   Patient's tumor was tested for the following markers: CA-125 Results of the tumor marker test revealed 852   03/04/2021 Tumor Marker   Patient's tumor was tested for the following markers: CA-125. Results of the tumor marker test revealed 541   03/11/2021 Tumor Marker   Patient's tumor was tested for the following markers:  CA-125 Results of the tumor marker test revealed 568   03/25/2021 Tumor Marker   Patient's tumor was tested for the following markers:CA-125 Results of the tumor marker test revealed 367.   04/08/2021 Tumor Marker   Patient's tumor was tested for the following markers: CA-125 Results of the tumor marker test revealed 273   04/29/2021 Tumor Marker   Patient's tumor was tested for the following markers: CA-125 Results of the tumor marker test revealed 431   06/06/2021 Tumor Marker   Patient's tumor was tested for the following markers: CA-125. Results of the tumor marker test revealed 594.   06/18/2021 Imaging   1. Nodular fascial thickening with subtle increase along the RIGHT pericolic gutter as described. Short interval follow-up may be helpful. 2. Stable appearance of fascial thickening and serosal thickening in the pelvis, potentially indicative of peritoneal disease. 3. Post RIGHT hemicolectomy. 4. Stable chronic occlusion of LEFT iliac vasculature with patent femoral femoral bypass grafting. 5. 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 6. Aortic atherosclerosis.   Aortic Atherosclerosis (ICD10-I70.0).     07/16/2021 -  Chemotherapy    Patient is on Treatment Plan: OVARIAN BEVACIZUMAB Q21D         REVIEW OF SYSTEMS:   Constitutional: Denies fevers, chills or abnormal weight  loss Eyes: Denies blurriness of vision Ears, nose, mouth, throat, and face: Denies mucositis or sore throat Respiratory: Denies cough, dyspnea or wheezes Cardiovascular: Denies palpitation, chest discomfort or lower extremity swelling Gastrointestinal:  Denies nausea, heartburn or change in bowel habits Skin: Denies abnormal skin rashes Lymphatics: Denies new lymphadenopathy or easy bruising Neurological:Denies numbness, tingling or new weaknesses Behavioral/Psych: Mood is stable, no new changes  All other systems were reviewed with the patient and are negative.  I have  reviewed the past medical history, past surgical history, social history and family history with the patient and they are unchanged from previous note.  ALLERGIES:  is allergic to other, demerol [meperidine], morphine and related, oxycodone, penicillins, strawberry flavor, tylenol [acetaminophen], and ibuprofen.  MEDICATIONS:  Current Outpatient Medications  Medication Sig Dispense Refill   doxazosin (CARDURA) 2 MG tablet Take 1 tablet (2 mg total) by mouth daily. 30 tablet 3   acetaminophen (TYLENOL) 325 MG tablet Take 325 mg by mouth as needed.     amLODipine (NORVASC) 10 MG tablet Take 1 tablet (10 mg total) by mouth daily. 30 tablet 3   atenolol (TENORMIN) 25 MG tablet Take 1 tablet (25 mg total) by mouth daily. 30 tablet 3   docusate sodium 100 MG CAPS Take 100 mg by mouth 2 (two) times daily as needed for mild constipation. 10 capsule 0   HYDROcodone-acetaminophen (NORCO/VICODIN) 5-325 MG tablet Take 1 tablet by mouth every 6 (six) hours as needed for moderate pain. 90 tablet 0   lidocaine (LIDODERM) 5 % PLACE 1 PATCH ONTO THE SKIN EVERY DAY REMOVE AND DISCARD WITHIN 12 HOURS OR AS DIRECTED BY PRESCRIBER 90 patch 1   lidocaine-prilocaine (EMLA) cream Apply to affected area once 30 g 3   losartan (COZAAR) 50 MG tablet Take 1 tablet (50 mg total) by mouth daily. 30 tablet 3   ondansetron (ZOFRAN) 8 MG tablet Take 1 tablet (8 mg total) by mouth every 8 (eight) hours as needed. Start on the third day after chemotherapy. 30 tablet 1   pregabalin (LYRICA) 200 MG capsule Take 1 capsule (200 mg total) by mouth every 8 (eight) hours. Please give patient brand name Lyrica 90 capsule 4   prochlorperazine (COMPAZINE) 10 MG tablet Take 1 tablet (10 mg total) by mouth every 6 (six) hours as needed (Nausea or vomiting). 30 tablet 1   No current facility-administered medications for this visit.    PHYSICAL EXAMINATION: ECOG PERFORMANCE STATUS: 1 - Symptomatic but completely ambulatory  Vitals:    07/02/21 1139  BP: (!) 185/88  Pulse: 99  Resp: 18  Temp: (!) 97.1 F (36.2 C)  SpO2: 100%   Filed Weights   07/02/21 1139  Weight: 140 lb 6.4 oz (63.7 kg)    GENERAL:alert, no distress and comfortable.  She appears anxious NEURO: alert & oriented x 3 with fluent speech, no focal motor/sensory deficits  LABORATORY DATA:  I have reviewed the data as listed    Component Value Date/Time   NA 141 07/02/2021 1129   NA 141 10/07/2017 1436   K 4.4 07/02/2021 1129   K 4.4 10/07/2017 1436   CL 109 07/02/2021 1129   CO2 26 07/02/2021 1129   CO2 26 10/07/2017 1436   GLUCOSE 86 07/02/2021 1129   GLUCOSE 85 10/07/2017 1436   BUN 22 (H) 07/02/2021 1129   BUN 14.9 10/07/2017 1436   CREATININE 0.76 07/02/2021 1129   CREATININE 1.0 10/07/2017 1436   CALCIUM 9.1 07/02/2021 1129   CALCIUM  9.5 10/07/2017 1436   PROT 6.4 (L) 07/02/2021 1129   PROT 7.8 10/07/2017 1436   ALBUMIN 3.6 07/02/2021 1129   ALBUMIN 3.9 10/07/2017 1436   AST 19 07/02/2021 1129   AST 15 10/07/2017 1436   ALT 13 07/02/2021 1129   ALT 7 10/07/2017 1436   ALKPHOS 71 07/02/2021 1129   ALKPHOS 81 10/07/2017 1436   BILITOT 0.5 07/02/2021 1129   BILITOT 0.54 10/07/2017 1436   GFRNONAA >60 07/02/2021 1129   GFRAA >60 08/15/2020 1121    No results found for: SPEP, UPEP  Lab Results  Component Value Date   WBC 4.9 07/02/2021   NEUTROABS 1.5 (L) 07/02/2021   HGB 9.8 (L) 07/02/2021   HCT 28.4 (L) 07/02/2021   MCV 106.4 (H) 07/02/2021   PLT 64 (L) 07/02/2021      Chemistry      Component Value Date/Time   NA 141 07/02/2021 1129   NA 141 10/07/2017 1436   K 4.4 07/02/2021 1129   K 4.4 10/07/2017 1436   CL 109 07/02/2021 1129   CO2 26 07/02/2021 1129   CO2 26 10/07/2017 1436   BUN 22 (H) 07/02/2021 1129   BUN 14.9 10/07/2017 1436   CREATININE 0.76 07/02/2021 1129   CREATININE 1.0 10/07/2017 1436      Component Value Date/Time   CALCIUM 9.1 07/02/2021 1129   CALCIUM 9.5 10/07/2017 1436   ALKPHOS 71  07/02/2021 1129   ALKPHOS 81 10/07/2017 1436   AST 19 07/02/2021 1129   AST 15 10/07/2017 1436   ALT 13 07/02/2021 1129   ALT 7 10/07/2017 1436   BILITOT 0.5 07/02/2021 1129   BILITOT 0.54 10/07/2017 1436

## 2021-07-02 NOTE — Assessment & Plan Note (Signed)
She has multifactorial pain Her pain is well controlled She will continue prescribed pain medicine and Lyrica

## 2021-07-02 NOTE — Assessment & Plan Note (Signed)
She has uncontrolled hypertension and proteinuria I recommend the addition of Cardura I will reassess blood pressure control in her next visit

## 2021-07-02 NOTE — Assessment & Plan Note (Signed)
Unfortunately, she developed heavy proteinuria and uncontrolled hypertension from recent treatment with bevacizumab We will put her treatment on hold today I recommend addition of Cardura I have we will reschedule her treatment for 2 weeks

## 2021-07-03 LAB — CA 125: Cancer Antigen (CA) 125: 671 U/mL — ABNORMAL HIGH (ref 0.0–38.1)

## 2021-07-05 ENCOUNTER — Telehealth: Payer: Self-pay | Admitting: Hematology and Oncology

## 2021-07-05 NOTE — Telephone Encounter (Signed)
Scheduled per 8/9 sch msg. Called pt and left a msg

## 2021-07-16 ENCOUNTER — Inpatient Hospital Stay: Payer: 59

## 2021-07-16 ENCOUNTER — Inpatient Hospital Stay (HOSPITAL_BASED_OUTPATIENT_CLINIC_OR_DEPARTMENT_OTHER): Payer: 59 | Admitting: Hematology and Oncology

## 2021-07-16 ENCOUNTER — Encounter: Payer: Self-pay | Admitting: Hematology and Oncology

## 2021-07-16 ENCOUNTER — Other Ambulatory Visit: Payer: Self-pay

## 2021-07-16 DIAGNOSIS — C182 Malignant neoplasm of ascending colon: Secondary | ICD-10-CM

## 2021-07-16 DIAGNOSIS — I10 Essential (primary) hypertension: Secondary | ICD-10-CM

## 2021-07-16 DIAGNOSIS — D61818 Other pancytopenia: Secondary | ICD-10-CM | POA: Diagnosis not present

## 2021-07-16 DIAGNOSIS — C562 Malignant neoplasm of left ovary: Secondary | ICD-10-CM

## 2021-07-16 DIAGNOSIS — G893 Neoplasm related pain (acute) (chronic): Secondary | ICD-10-CM | POA: Diagnosis not present

## 2021-07-16 DIAGNOSIS — Z5112 Encounter for antineoplastic immunotherapy: Secondary | ICD-10-CM | POA: Diagnosis not present

## 2021-07-16 LAB — CBC WITH DIFFERENTIAL (CANCER CENTER ONLY)
Abs Immature Granulocytes: 0 10*3/uL (ref 0.00–0.07)
Basophils Absolute: 0 10*3/uL (ref 0.0–0.1)
Basophils Relative: 1 %
Eosinophils Absolute: 0 10*3/uL (ref 0.0–0.5)
Eosinophils Relative: 1 %
HCT: 34.2 % — ABNORMAL LOW (ref 36.0–46.0)
Hemoglobin: 11.8 g/dL — ABNORMAL LOW (ref 12.0–15.0)
Immature Granulocytes: 0 %
Lymphocytes Relative: 57 %
Lymphs Abs: 2.8 10*3/uL (ref 0.7–4.0)
MCH: 36.5 pg — ABNORMAL HIGH (ref 26.0–34.0)
MCHC: 34.5 g/dL (ref 30.0–36.0)
MCV: 105.9 fL — ABNORMAL HIGH (ref 80.0–100.0)
Monocytes Absolute: 0.5 10*3/uL (ref 0.1–1.0)
Monocytes Relative: 10 %
Neutro Abs: 1.5 10*3/uL — ABNORMAL LOW (ref 1.7–7.7)
Neutrophils Relative %: 31 %
Platelet Count: 103 10*3/uL — ABNORMAL LOW (ref 150–400)
RBC: 3.23 MIL/uL — ABNORMAL LOW (ref 3.87–5.11)
RDW: 15.8 % — ABNORMAL HIGH (ref 11.5–15.5)
WBC Count: 4.9 10*3/uL (ref 4.0–10.5)
nRBC: 0 % (ref 0.0–0.2)

## 2021-07-16 LAB — CMP (CANCER CENTER ONLY)
ALT: 15 U/L (ref 0–44)
AST: 19 U/L (ref 15–41)
Albumin: 4.2 g/dL (ref 3.5–5.0)
Alkaline Phosphatase: 71 U/L (ref 38–126)
Anion gap: 9 (ref 5–15)
BUN: 20 mg/dL (ref 6–20)
CO2: 22 mmol/L (ref 22–32)
Calcium: 9.1 mg/dL (ref 8.9–10.3)
Chloride: 109 mmol/L (ref 98–111)
Creatinine: 0.8 mg/dL (ref 0.44–1.00)
GFR, Estimated: >60 mL/min (ref >60)
Glucose, Bld: 101 mg/dL — ABNORMAL HIGH (ref 70–99)
Potassium: 4.1 mmol/L (ref 3.5–5.1)
Sodium: 140 mmol/L (ref 135–145)
Total Bilirubin: 0.5 mg/dL (ref 0.3–1.2)
Total Protein: 7.2 g/dL (ref 6.5–8.1)

## 2021-07-16 MED ORDER — HYDROCODONE-ACETAMINOPHEN 5-325 MG PO TABS: 1.0000 | ORAL_TABLET | Freq: Four times a day (QID) | ORAL | 0 refills | Status: DC | PRN

## 2021-07-16 MED ORDER — SODIUM CHLORIDE 0.9% FLUSH
10.0000 mL | INTRAVENOUS | Status: DC | PRN
  Administered 2021-07-16: 10 mL

## 2021-07-16 MED ORDER — SODIUM CHLORIDE 0.9 % IV SOLN: Freq: Once | INTRAVENOUS | Status: AC

## 2021-07-16 MED ORDER — SODIUM CHLORIDE 0.9 % IV SOLN
7.4000 mg/kg | Freq: Once | INTRAVENOUS | Status: AC
  Administered 2021-07-16: 450 mg via INTRAVENOUS
  Filled 2021-07-16: qty 16

## 2021-07-16 MED ORDER — SODIUM CHLORIDE 0.9 % IV SOLN: 7.5000 mg/kg | Freq: Once | INTRAVENOUS | Status: DC

## 2021-07-16 MED ORDER — HEPARIN SOD (PORK) LOCK FLUSH 100 UNIT/ML IV SOLN
500.0000 [IU] | Freq: Once | INTRAVENOUS | Status: AC | PRN
Start: 2021-07-16 — End: 2021-07-16
  Administered 2021-07-16: 500 [IU]

## 2021-07-16 MED ORDER — SODIUM CHLORIDE 0.9% FLUSH
10.0000 mL | Freq: Once | INTRAVENOUS | Status: AC
Start: 2021-07-16 — End: 2021-07-16
  Administered 2021-07-16: 10 mL

## 2021-07-16 NOTE — Assessment & Plan Note (Signed)
She has multifactorial pain Her pain is well controlled She will continue prescribed pain medicine and Lyrica

## 2021-07-16 NOTE — Progress Notes (Signed)
Ok to proceed with urine protein from 07/02/21 (300) per Dr Alvy Bimler.  Will lower dose today.

## 2021-07-16 NOTE — Patient Instructions (Signed)
[facility]   °Discharge Instructions: °Thank you for choosing [facility] to provide your oncology and hematology care.  ° °If you have a lab appointment with the Cancer Center, please go directly to the Cancer Center and check in at the registration area. °  °Wear comfortable clothing and clothing appropriate for easy access to any Portacath or PICC line.  ° °We strive to give you quality time with your provider. You may need to reschedule your appointment if you arrive late (15 or more minutes).  Arriving late affects you and other patients whose appointments are after yours.  Also, if you miss three or more appointments without notifying the office, you may be dismissed from the clinic at the provider’s discretion.    °  °For prescription refill requests, have your pharmacy contact our office and allow 72 hours for refills to be completed.   ° °Today you received the following chemotherapy and/or immunotherapy agents: avastin  °  °To help prevent nausea and vomiting after your treatment, we encourage you to take your nausea medication as directed. ° °BELOW ARE SYMPTOMS THAT SHOULD BE REPORTED IMMEDIATELY: °*FEVER GREATER THAN 100.4 F (38 °C) OR HIGHER °*CHILLS OR SWEATING °*NAUSEA AND VOMITING THAT IS NOT CONTROLLED WITH YOUR NAUSEA MEDICATION °*UNUSUAL SHORTNESS OF BREATH °*UNUSUAL BRUISING OR BLEEDING °*URINARY PROBLEMS (pain or burning when urinating, or frequent urination) °*BOWEL PROBLEMS (unusual diarrhea, constipation, pain near the anus) °TENDERNESS IN MOUTH AND THROAT WITH OR WITHOUT PRESENCE OF ULCERS (sore throat, sores in mouth, or a toothache) °UNUSUAL RASH, SWELLING OR PAIN  °UNUSUAL VAGINAL DISCHARGE OR ITCHING  ° °Items with * indicate a potential emergency and should be followed up as soon as possible or go to the Emergency Department if any problems should occur. ° °Please show the CHEMOTHERAPY ALERT CARD or IMMUNOTHERAPY ALERT CARD at check-in to  the Emergency Department and triage nurse. ° °Should you have questions after your visit or need to cancel or reschedule your appointment, please contact [facility]  Dept: [phone]  and follow the prompts.  Office hours are 8:00 a.m. to 4:30 p.m. Monday - Friday. Please note that voicemails left after 4:00 p.m. may not be returned until the following business day.  We are closed weekends and major holidays. You have access to a nurse at all times for urgent questions. Please call the main number to the clinic Dept: [phone] and follow the prompts. ° ° °For any non-urgent questions, you may also contact your provider using MyChart. We now offer e-Visits for anyone 18 and older to request care online for non-urgent symptoms. For details visit [email or web address]. °  °Also download the MyChart app! Go to the app store, search "MyChart", open the app, select [facility], and log in with your MyChart username and password. ° °Due to Covid, a mask is required upon entering the hospital/clinic. If you do not have a mask, one will be given to you upon arrival. For doctor visits, patients may have 1 support person aged 18 or older with them. For treatment visits, patients cannot have anyone with them due to current Covid guidelines and our immunocompromised population.  ° °

## 2021-07-16 NOTE — Progress Notes (Signed)
Dr. Alvy Bimler would like Bevacizumab dosed at 450 mg today; using today's weight.  Kennith Center, Pharm.D., CPP 07/16/2021@11 :12 AM

## 2021-07-16 NOTE — Assessment & Plan Note (Signed)
Her blood pressure control has improved with recent addition of Cardura According to the patient, the last few days, her blood pressure were within normal range We will resume bevacizumab at 7.5 mg/kg every 3 weeks

## 2021-07-16 NOTE — Progress Notes (Signed)
[place] OFFICE PROGRESS NOTE  Patient Care Team: Marrian Salvage, 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 [facility]) Her blood pressure control has improved with recent addition of Cardura According to the patient, the last few days, her blood pressure were within normal range We will resume bevacizumab at 7.5 mg/kg every 3 weeks  Essential hypertension Documented blood pressure from home is much improved Will resume bevacizumab with reduced dose She is instructed to check her blood pressure twice daily and to bring documentation of her blood pressure from home in her next visit  Pancytopenia, acquired [facility]) This is due to her recent treatment, much improved She is not symptomatic She does not need transfusion support Observe closely  Cancer associated pain She has multifactorial pain Her pain is well controlled She will continue prescribed pain medicine and Lyrica  No orders of the defined types were placed in this encounter.   All questions were answered. The patient knows to call the clinic with any problems, questions or concerns. The total time spent in the appointment was 20 minutes encounter with patients including review of chart and various tests results, discussions about plan of care and coordination of care plan   Heath Lark, MD 07/16/2021 2:44 PM  INTERVAL HISTORY: Please see below for problem oriented charting. She returns for further follow-up She is doing better Her documented blood pressure from home is back to within normal range in the last few days She denies abdominal pain, bloating or changes in bowel habits Her chronic pain is  stable  SUMMARY OF ONCOLOGIC HISTORY: Oncology History Overview Note  BRCA1 positive   Cancer of right colon ([place])  09/16/2014 Imaging   CT abd/pel:  a 3.8cm cecal mass with associated ileocecal intussusception, and a 9.1cm solid and cystic mass in the left pelvis,   09/18/2014 Surgery   right hemicolectomy and terminal ileoectomy   09/18/2014 Pathologic Stage   pT3pN1Mx, tumor extends into pericolonic soft tissue and is less than 28m from the serosal surface, LVI 8-), PNI (-), all 23 node negative, one soft tissue tumor deposit, surgical margins negative.    09/18/2014 Initial Diagnosis   Adenocarcinoma of right colon   08/06/2016 Imaging   CT chest/abd/pelvis with contrast IMPRESSION: 1. No recurrent malignancy identified. 2. 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 On the prior exam from 09/13/2015 there was back flow of contrast in the left external iliac artery to supply the left internal iliac artery ; this back flow of contrast is no longer present in the left external iliac artery is occluded. 3. Several pulmonary nodules at or below 4 mm diameter, unchanged. 4. 3 mm hypodensity posteriorly in the body of the pancreas, not well seen previously and subtle today, likely incidental, but merits observation. 5. 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 Prominent degenerative spurring of both acetabula. 6. Lumbar spondylosis and degenerative disc disease causing prominent impingement at L4-5 and mild impingement at L5-S1.   03/08/2017 Imaging   CT CAP W Contrast 03/08/17 IMPRESSION: Chest Impression: 1. Stable small pulmonary nodules within LEFT lung. 2. No mediastinal lymphadenopathy.  Abdomen / Pelvis Impression: 1. No evidence of metastatic colorectal  cancer or ovarian cancer within the abdomen pelvis. 2. No lymphadenopathy. 3. Postsurgical change consistent RIGHT hemicolectomy and  hysterectomy 4. A fem-fem arterial bypass noted.   11/13/2017 Imaging   CT AP w contrast IMPRESSION: 1. No findings of recurrent malignancy in the abdomen or pelvis. 2. Other imaging findings of potential clinical significance:Chronic AVN of both femoral heads without contour abnormality. Lumbar impingement at L4-5 and L5-S1. Aortic Atherosclerosis (ICD10-I70.0). 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 Partial right hemicolectomy. Hysterectomy.   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 This was discovered during her workup for colon cancer.   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 Tumor size 3.7 cm, no lymph nodes removed. T1cNx. Tumor cells are positive for cytokeratin 7 and estrogen receptor.   11/01/2014 Surgery   B/l Salpingo-oophorectomy.   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 Stable small pulmonary nodules within LEFT lung. 2. No mediastinal lymphadenopathy.  Abdomen / Pelvis Impression:  1. No evidence of metastatic colorectal cancer or ovarian cancer within the abdomen pelvis. 2. No lymphadenopathy. 3. Postsurgical change consistent RIGHT hemicolectomy and hysterectomy 4. A fem-fem arterial bypass noted.   03/12/2017 Mammogram   MM Right Breast 03/12/17 IMPRESSION: No mammographic or sonographic evidence of malignancy. No correlate identified for the abnormal enhancement seen within the right nipple on MRI of  02/27/2017.    04/02/2017 Pathology Results   Surgical Pathology of Nipple Biopsy: 04/02/17 Diagnosis Nipple Biopsy, Right - BENIGN NIPPLE TISSUE. - NO ATYPIA OR MALIGNANCY.    11/13/2017 Imaging   1. No findings of recurrent malignancy in the abdomen or pelvis. 2. Other imaging findings of potential clinical significance: Chronic AVN of both femoral heads without contour abnormality. Lumbar impingement at L4-5 and L5-S1. Aortic Atherosclerosis (ICD10-I70.0). 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 Partial right hemicolectomy. Hysterectomy.   03/22/2018 Mammogram   Screening mammogram IMPRESSION No evidence of malignancy.     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   06/06/2019 Imaging   1. Tiny bilateral pulmonary nodules stable since 11/12/2018. Continued close attention on follow-up recommended. 2. Incompletely visualized upper abdominal lymphadenopathy better characterized on the abdomen CT from 2 weeks ago. 3.  Aortic Atherosclerois (ICD10-170.0) 4.  Emphysema. (YTR17-B56.9)   06/07/2019 Procedure   Technically successful CT guided core needle biopsy of indeterminate peritoneal nodule within the right lower abdomen/pelvis.   06/07/2019 Pathology Results   Soft Tissue Needle Core Biopsy, peritoneal nodule within right lower abdomen - METASTATIC CARCINOMA CONSISTENT WITH PATIENT'S CLINICAL HISTORY OF PRIMARY OVARIAN CARCINOMA. SEE NOTE Diagnosis Note Immunohistochemical stains show that the tumor cells are positive for CK7, ER and PAX 8; and negative for  CK20 and CDX2. This immunostaining profile is consistent with above diagnosis.   06/16/2019 Tumor Marker   Patient's tumor was tested  for the following markers: CA-125 Results of the tumor marker test revealed 499   06/20/2019 - 11/14/2019 Chemotherapy   The patient had carboplatin and taxol   07/11/2019 Tumor Marker   Patient's tumor was tested for the following markers: CA-125 Results of the tumor marker test revealed 438   08/05/2019 Tumor Marker   Patient's tumor was tested for the following markers: CA-125 Results of the tumor marker test revealed 272.   08/25/2019 Imaging   Ct chest, abdomen and pelvis 1. Slight interval decrease in size of a right lower quadrant peritoneal nodule measuring 1.7 x 1.6 cm, previously 2.2 x 2.2 cm (series 2, image 94, series 5, image 58). Findings are consistent with treatment response.   2. No significant change in ill-defined soft tissue about the sigmoid colon in the hysterectomy bed (series 2, image 101).   3.  Status post hysterectomy.   4. No evidence of metastatic disease in the chest. Stable benign small pulmonary nodules.   5.  Coronary artery disease.  Aortic Atherosclerosis (ICD10-I70.0).     08/26/2019 Tumor Marker   Patient's tumor was tested for the following markers: CA-125 Results of the tumor marker test revealed 189.   09/26/2019 Tumor Marker   Patient's tumor was tested for the following markers: CA-125 Results of the tumor marker test revealed 137   10/03/2019 Tumor Marker   Patient's tumor was tested for the following markers: CA-125 Results of the tumor marker test revealed 137.   10/31/2019 Tumor Marker   Patient's tumor was tested for the following markers: CA-125 Results of the tumor marker test revealed 101   11/29/2019 Imaging   1. Right lower quadrant peritoneal implant is decreased. Stable irregular nodular pelvic peritoneal thickening superior to the vaginal cuff. 2. No new or progressive metastatic disease. No abdominopelvic lymphadenopathy. 3.  Aortic Atherosclerosis (ICD10-I70.0).   12/15/2019 -  Chemotherapy   The patient had Lynparza for  chemotherapy treatment.     12/23/2019 Tumor Marker   Patient's tumor was tested for the following markers: CA-125 Results of the tumor marker test revealed 55.9   01/02/2020 Tumor Marker   Patient's tumor was tested for the following markers: CA-125. Results of the tumor marker test revealed 58.6   02/03/2020 Tumor Marker   Patient's tumor was tested for the following markers: CA-125 Results of the tumor marker test revealed 52.2   02/24/2020 Tumor Marker   Patient's tumor was tested for the following markers: CA-125 Results of the tumor marker test revealed 64.8   04/19/2020 Imaging   1. Continued slight interval decrease in size of a soft tissue nodule in the right paracolic gutter, consistent with treatment response. 2. Soft tissue in the hysterectomy bed near the distal sigmoid colon and rectum may be slightly decreased in size, difficult to measure due to configuration but apparently diminished in size, also consistent with treatment response. 3. Status post hysterectomy and oophorectomy as well as right colon resection. 4. No evidence of new metastatic disease in the abdomen or pelvis. 5. Redemonstrated long segment occlusion of the left common, internal, and external iliac arteries. Status post femoral-femoral bypass grafting with reconstitution of the left common femoral artery. Aortic Atherosclerosis (ICD10-I70.0).   08/15/2020 Tumor Marker   Patient's tumor was tested for the following markers: CA-125 Results of the tumor marker test revealed 47.1   10/22/2020 Imaging  1. Reduced size of the nodule along the right paracolic gutter, currently 0.6 by 1.2 cm, previously 0.7 by 1.4 cm on 04/19/2020 and previously 0.9 by 1.5 cm on 11/29/2019. Appearance compatible with treatment response. 2. Further mild reduction in size of the bandlike density along the rectosigmoid mesentery, probably from scarring or effectively treated tumor. 3. Prominent stool throughout the colon favors  constipation.  4. Stable chronic occlusion of the left iliac arteries, with a patent fem-fem bypass. 5. 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 6. Lower lumbar spondylosis and degenerative disc disease causing impingement at L4-5 and L5-S1. 7. Aortic atherosclerosis.     10/23/2020 Tumor Marker   Patient's tumor was tested for the following markers: CA-125 Results of the tumor marker test revealed 74.6   01/21/2021 Tumor Marker   Patient's tumor was tested for the following markers: CA-125 Results of the tumor marker test revealed 825   01/28/2021 Imaging   1. Changes of right hemicolectomy with slightly decreased size of the nodule along the right pericolic gutter and similar band like density along the rectosigmoid mesentery. 2. Two new well-circumscribed low-density nodules in the presacral perirectal space, difficult to ascertain whether they arise from the bowel wall or arise from within the peritoneum. This is a nonspecific finding which could be further evaluated with colonoscopy, repeat CT with rectal contrast or attention on close interval follow-up imaging in 3 months. 3. 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 4. 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 5. Emphysema and aortic atherosclerosis. Aortic Atherosclerosis (ICD10-I70.0) and Emphysema (ICD10-J43.9).   02/08/2021 Procedure   Successful placement of a right internal jugular approach power injectable Port-A-Cath. The catheter is ready for immediate use.     02/11/2021 - 06/06/2021 Chemotherapy          02/18/2021 Tumor Marker   Patient's tumor was tested for the following markers: CA-125 Results of the tumor marker test revealed 852   03/04/2021 Tumor Marker   Patient's tumor was tested for the following markers: CA-125. Results of the tumor marker test revealed 541    03/11/2021 Tumor Marker   Patient's tumor was tested for the following markers: CA-125 Results of the tumor marker test revealed 568   03/25/2021 Tumor Marker   Patient's tumor was tested for the following markers:CA-125 Results of the tumor marker test revealed 367.   04/08/2021 Tumor Marker   Patient's tumor was tested for the following markers: CA-125 Results of the tumor marker test revealed 273   04/29/2021 Tumor Marker   Patient's tumor was tested for the following markers: CA-125 Results of the tumor marker test revealed 431   06/06/2021 Tumor Marker   Patient's tumor was tested for the following markers: CA-125. Results of the tumor marker test revealed 594.   06/18/2021 Imaging   1. Nodular fascial thickening with subtle increase along the RIGHT pericolic gutter as described. Short interval follow-up may be helpful. 2. Stable appearance of fascial thickening and serosal thickening in the pelvis, potentially indicative of peritoneal disease. 3. Post RIGHT hemicolectomy. 4. Stable chronic occlusion of LEFT iliac vasculature with patent femoral femoral bypass grafting. 5. 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 6. Aortic atherosclerosis.   Aortic Atherosclerosis (ICD10-I70.0).     07/16/2021 -  Chemotherapy    Patient is on Treatment Plan: OVARIAN BEVACIZUMAB Q21D  REVIEW OF SYSTEMS:   Constitutional: Denies fevers, chills or abnormal weight loss Eyes: Denies blurriness of vision Ears, nose, mouth, throat, and face: Denies mucositis or sore throat Respiratory: Denies cough, dyspnea or wheezes Cardiovascular: Denies palpitation, chest discomfort or lower extremity swelling Gastrointestinal:  Denies nausea, heartburn or change in bowel habits Skin: Denies abnormal skin rashes Lymphatics: Denies new lymphadenopathy or easy bruising Neurological:Denies numbness, tingling or new weaknesses Behavioral/Psych: Mood is stable, no new changes   All other systems were reviewed with the patient and are negative.  I have reviewed the past medical history, past surgical history, social history and family history with the patient and they are unchanged from previous note.  ALLERGIES:  is allergic to other, demerol [meperidine], morphine and related, oxycodone, penicillins, strawberry flavor, tylenol [acetaminophen], and ibuprofen.  MEDICATIONS:  Current Outpatient Medications  Medication Sig Dispense Refill   acetaminophen (TYLENOL) 325 MG tablet Take 325 mg by mouth as needed.     amLODipine (NORVASC) 10 MG tablet Take 1 tablet (10 mg total) by mouth daily. 30 tablet 3   atenolol (TENORMIN) 25 MG tablet Take 1 tablet (25 mg total) by mouth daily. 30 tablet 3   docusate sodium 100 MG CAPS Take 100 mg by mouth 2 (two) times daily as needed for mild constipation. 10 capsule 0   doxazosin (CARDURA) 2 MG tablet Take 1 tablet (2 mg total) by mouth daily. 30 tablet 3   HYDROcodone-acetaminophen (NORCO/VICODIN) 5-325 MG tablet Take 1 tablet by mouth every 6 (six) hours as needed for moderate pain. 90 tablet 0   lidocaine (LIDODERM) 5 % PLACE 1 PATCH ONTO THE SKIN EVERY DAY REMOVE AND DISCARD WITHIN 12 HOURS OR AS DIRECTED BY PRESCRIBER 90 patch 1   lidocaine-prilocaine (EMLA) cream Apply to affected area once 30 g 3   losartan (COZAAR) 50 MG tablet Take 1 tablet (50 mg total) by mouth daily. 30 tablet 3   ondansetron (ZOFRAN) 8 MG tablet Take 1 tablet (8 mg total) by mouth every 8 (eight) hours as needed. Start on the third day after chemotherapy. 30 tablet 1   pregabalin (LYRICA) 200 MG capsule Take 1 capsule (200 mg total) by mouth every 8 (eight) hours. Please give patient brand name Lyrica 90 capsule 4   prochlorperazine (COMPAZINE) 10 MG tablet Take 1 tablet (10 mg total) by mouth every 6 (six) hours as needed (Nausea or vomiting). 30 tablet 1   No current facility-administered medications for this visit.   Facility-Administered  Medications Ordered in Other Visits  Medication Dose Route Frequency Provider Last Rate Last Admin   sodium chloride flush (NS) 0.9 % injection 10 mL  10 mL Intracatheter PRN Alvy Bimler, Ariann Khaimov, MD   10 mL at 07/16/21 1156    PHYSICAL EXAMINATION: ECOG PERFORMANCE STATUS: 1 - Symptomatic but completely ambulatory  Vitals:   07/16/21 1016  BP: (!) 154/91  Pulse: 81  Resp: 18  Temp: 98 F (36.7 C)  SpO2: 100%   Filed Weights   07/16/21 1016  Weight: 132 lb 6.4 oz (60.1 kg)    GENERAL:alert, no distress and comfortable SKIN: skin color, texture, turgor are normal, no rashes or significant lesions EYES: normal, Conjunctiva are pink and non-injected, sclera clear OROPHARYNX:no exudate, no erythema and lips, buccal mucosa, and tongue normal  NECK: supple, thyroid normal size, non-tender, without nodularity LYMPH:  no palpable lymphadenopathy in the cervical, axillary or inguinal LUNGS: clear to auscultation and percussion with normal breathing effort HEART: regular rate & rhythm  and no murmurs and no lower extremity edema ABDOMEN:abdomen soft, non-tender and normal bowel sounds Musculoskeletal:no cyanosis of digits and no clubbing  NEURO: alert & oriented x 3 with fluent speech, no focal motor/sensory deficits  LABORATORY DATA:  I have reviewed the data as listed    Component Value Date/Time   NA 140 07/16/2021 1007   NA 141 10/07/2017 1436   K 4.1 07/16/2021 1007   K 4.4 10/07/2017 1436   CL 109 07/16/2021 1007   CO2 22 07/16/2021 1007   CO2 26 10/07/2017 1436   GLUCOSE 101 (H) 07/16/2021 1007   GLUCOSE 85 10/07/2017 1436   BUN 20 07/16/2021 1007   BUN 14.9 10/07/2017 1436   CREATININE 0.80 07/16/2021 1007   CREATININE 1.0 10/07/2017 1436   CALCIUM 9.1 07/16/2021 1007   CALCIUM 9.5 10/07/2017 1436   PROT 7.2 07/16/2021 1007   PROT 7.8 10/07/2017 1436   ALBUMIN 4.2 07/16/2021 1007   ALBUMIN 3.9 10/07/2017 1436   AST 19 07/16/2021 1007   AST 15 10/07/2017 1436   ALT 15  07/16/2021 1007   ALT 7 10/07/2017 1436   ALKPHOS 71 07/16/2021 1007   ALKPHOS 81 10/07/2017 1436   BILITOT 0.5 07/16/2021 1007   BILITOT 0.54 10/07/2017 1436   GFRNONAA >60 07/16/2021 1007   GFRAA >60 08/15/2020 1121    No results found for: SPEP, UPEP  Lab Results  Component Value Date   WBC 4.9 07/16/2021   NEUTROABS 1.5 (L) 07/16/2021   HGB 11.8 (L) 07/16/2021   HCT 34.2 (L) 07/16/2021   MCV 105.9 (H) 07/16/2021   PLT 103 (L) 07/16/2021      Chemistry      Component Value Date/Time   NA 140 07/16/2021 1007   NA 141 10/07/2017 1436   K 4.1 07/16/2021 1007   K 4.4 10/07/2017 1436   CL 109 07/16/2021 1007   CO2 22 07/16/2021 1007   CO2 26 10/07/2017 1436   BUN 20 07/16/2021 1007   BUN 14.9 10/07/2017 1436   CREATININE 0.80 07/16/2021 1007   CREATININE 1.0 10/07/2017 1436      Component Value Date/Time   CALCIUM 9.1 07/16/2021 1007   CALCIUM 9.5 10/07/2017 1436   ALKPHOS 71 07/16/2021 1007   ALKPHOS 81 10/07/2017 1436   AST 19 07/16/2021 1007   AST 15 10/07/2017 1436   ALT 15 07/16/2021 1007   ALT 7 10/07/2017 1436   BILITOT 0.5 07/16/2021 1007   BILITOT 0.54 10/07/2017 1436

## 2021-07-16 NOTE — Assessment & Plan Note (Signed)
This is due to her recent treatment, much improved She is not symptomatic She does not need transfusion support Observe closely

## 2021-07-16 NOTE — Assessment & Plan Note (Signed)
Documented blood pressure from home is much improved Will resume bevacizumab with reduced dose She is instructed to check her blood pressure twice daily and to bring documentation of her blood pressure from home in her next visit

## 2021-07-17 LAB — CA 125: Cancer Antigen (CA) 125: 996 U/mL — ABNORMAL HIGH (ref 0.0–38.1)

## 2021-07-26 ENCOUNTER — Telehealth: Payer: Self-pay

## 2021-07-26 ENCOUNTER — Other Ambulatory Visit: Payer: Self-pay

## 2021-07-26 MED ORDER — LOSARTAN POTASSIUM 50 MG PO TABS: 75.0000 mg | ORAL_TABLET | Freq: Every day | ORAL | 3 refills | Status: DC

## 2021-07-26 NOTE — Telephone Encounter (Signed)
-----   Message from Heath Lark, MD sent at 07/26/2021  9:23 AM EDT ----- Can you call her and check on her BP?

## 2021-07-26 NOTE — Telephone Encounter (Signed)
Still too high I suggest increase losartan to 75 mg, please send new prescription to her pharmacy

## 2021-07-26 NOTE — Telephone Encounter (Signed)
Called to get blood pressure readings.   8/25 am bp 123/77, HR 75 8/26 pm 134/85, HR 74 8/27 pm 123/76 HR 74 8/28 pm 145/60, HR 73 8/29 pm 156/75 HR 70 8/30 did not check 8/31 pm 140/85, HR 75 9/1 pm 145/90, HR 75, she got stressful news about a family member. 9/2 she has checked today. She is feeling good and trying to drink lots of water.

## 2021-07-26 NOTE — Telephone Encounter (Signed)
Called and left below message. Ask her to call the office back for questions. Rx sent to pharmacy.

## 2021-08-06 ENCOUNTER — Encounter: Payer: Self-pay | Admitting: Hematology and Oncology

## 2021-08-06 ENCOUNTER — Inpatient Hospital Stay: Payer: 59 | Attending: Hematology and Oncology

## 2021-08-06 ENCOUNTER — Inpatient Hospital Stay (HOSPITAL_BASED_OUTPATIENT_CLINIC_OR_DEPARTMENT_OTHER): Payer: 59 | Admitting: Hematology and Oncology

## 2021-08-06 ENCOUNTER — Other Ambulatory Visit: Payer: Self-pay

## 2021-08-06 ENCOUNTER — Inpatient Hospital Stay: Payer: 59

## 2021-08-06 DIAGNOSIS — C562 Malignant neoplasm of left ovary: Secondary | ICD-10-CM

## 2021-08-06 DIAGNOSIS — G893 Neoplasm related pain (acute) (chronic): Secondary | ICD-10-CM

## 2021-08-06 DIAGNOSIS — I10 Essential (primary) hypertension: Secondary | ICD-10-CM | POA: Insufficient documentation

## 2021-08-06 DIAGNOSIS — D61818 Other pancytopenia: Secondary | ICD-10-CM | POA: Insufficient documentation

## 2021-08-06 DIAGNOSIS — R809 Proteinuria, unspecified: Secondary | ICD-10-CM | POA: Diagnosis not present

## 2021-08-06 DIAGNOSIS — Z79899 Other long term (current) drug therapy: Secondary | ICD-10-CM | POA: Insufficient documentation

## 2021-08-06 DIAGNOSIS — C182 Malignant neoplasm of ascending colon: Secondary | ICD-10-CM

## 2021-08-06 LAB — CBC WITH DIFFERENTIAL (CANCER CENTER ONLY)
Abs Immature Granulocytes: 0.01 10*3/uL (ref 0.00–0.07)
Basophils Absolute: 0 10*3/uL (ref 0.0–0.1)
Basophils Relative: 0 %
Eosinophils Absolute: 0 10*3/uL (ref 0.0–0.5)
Eosinophils Relative: 1 %
HCT: 31.6 % — ABNORMAL LOW (ref 36.0–46.0)
Hemoglobin: 11 g/dL — ABNORMAL LOW (ref 12.0–15.0)
Immature Granulocytes: 0 %
Lymphocytes Relative: 40 %
Lymphs Abs: 2.3 10*3/uL (ref 0.7–4.0)
MCH: 36.5 pg — ABNORMAL HIGH (ref 26.0–34.0)
MCHC: 34.8 g/dL (ref 30.0–36.0)
MCV: 105 fL — ABNORMAL HIGH (ref 80.0–100.0)
Monocytes Absolute: 0.5 10*3/uL (ref 0.1–1.0)
Monocytes Relative: 8 %
Neutro Abs: 3 10*3/uL (ref 1.7–7.7)
Neutrophils Relative %: 51 %
Platelet Count: 76 10*3/uL — ABNORMAL LOW (ref 150–400)
RBC: 3.01 MIL/uL — ABNORMAL LOW (ref 3.87–5.11)
RDW: 13.9 % (ref 11.5–15.5)
WBC Count: 5.9 10*3/uL (ref 4.0–10.5)
nRBC: 0 % (ref 0.0–0.2)

## 2021-08-06 LAB — CMP (CANCER CENTER ONLY)
ALT: 14 U/L (ref 0–44)
AST: 20 U/L (ref 15–41)
Albumin: 3.7 g/dL (ref 3.5–5.0)
Alkaline Phosphatase: 57 U/L (ref 38–126)
Anion gap: 10 (ref 5–15)
BUN: 16 mg/dL (ref 6–20)
CO2: 26 mmol/L (ref 22–32)
Calcium: 9.3 mg/dL (ref 8.9–10.3)
Chloride: 106 mmol/L (ref 98–111)
Creatinine: 0.73 mg/dL (ref 0.44–1.00)
GFR, Estimated: >60 mL/min (ref >60)
Glucose, Bld: 93 mg/dL (ref 70–99)
Potassium: 3.8 mmol/L (ref 3.5–5.1)
Sodium: 142 mmol/L (ref 135–145)
Total Bilirubin: 0.9 mg/dL (ref 0.3–1.2)
Total Protein: 6.8 g/dL (ref 6.5–8.1)

## 2021-08-06 LAB — TOTAL PROTEIN, URINE DIPSTICK: Protein, ur: 300 mg/dL — AB

## 2021-08-06 MED ORDER — SODIUM CHLORIDE 0.9% FLUSH
10.0000 mL | Freq: Once | INTRAVENOUS | Status: AC
  Administered 2021-08-06: 10 mL

## 2021-08-06 MED ORDER — ATENOLOL 50 MG PO TABS: 50.0000 mg | ORAL_TABLET | Freq: Every day | ORAL | 3 refills | Status: DC

## 2021-08-06 NOTE — Assessment & Plan Note (Signed)
She has multifactorial pain Her pain is well controlled She will continue prescribed pain medicine and Lyrica

## 2021-08-06 NOTE — Progress Notes (Signed)
[place] OFFICE PROGRESS NOTE  Patient Care Team: Marrian Salvage, Geraldine as PCP - General (Internal Medicine) Judeth Horn, MD as Consulting Physician (General Surgery) Nat Math, MD as Referring Physician (Obstetrics and Gynecology) Elam Dutch, MD as Consulting Physician (Vascular Surgery) Heath Lark, MD as Consulting Physician (Hematology and Oncology)  ASSESSMENT & PLAN:  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 [facility]) We are not able to proceed with treatment today due to pancytopenia and uncontrolled hypertension with proteinuria Plan to increase the dose of her antihypertensives and call her next week for further management In the meantime, continue supportive care If we are not able to resume her treatment within the next month, I will abandon the plan for maintenance bevacizumab  Essential hypertension She is currently taking 4 different antihypertensives and her hypertension remained uncontrolled with heavy proteinuria I plan to increase the dose of atenolol to 50 mg daily We will call her next week for further follow-up She is instructed to continue to monitor her blood pressure closely  Pancytopenia, acquired ([place]) This is due to her recent treatment She is not symptomatic She does not need transfusion support Observe closely I suspect her worsening thrombocytopenia is due to uncontrolled hypertension  Cancer associated pain She has multifactorial pain Her pain is well controlled She will continue prescribed pain medicine and Lyrica  No orders of the defined types were placed in this encounter.   All questions were answered. The patient knows to call the clinic with any problems, questions or concerns. The total time spent in the appointment was 30 minutes encounter with patients including review of chart and various tests results, discussions about plan of care and coordination of care plan   Heath Lark, MD 08/06/2021 2:55 PM  INTERVAL  HISTORY: Please see below for problem oriented charting. she returns for treatment follow-up for bevacizumab for recurrent ovarian cancer She has not been monitoring her blood pressure on a regular basis and did not bring her documented blood pressure from home According to the patient, she recalled that her blood pressure is quite high She denies headache or blurriness of vision She is taking her prescribed blood pressure medications The patient denies any recent signs or symptoms of bleeding such as spontaneous epistaxis, hematuria or hematochezia. She denies abdominal pain, bloating or changes in bowel habits Her chronic neuropathic pain is stable  REVIEW OF SYSTEMS:   Constitutional: Denies fevers, chills or abnormal weight loss Eyes: Denies blurriness of vision Ears, nose, mouth, throat, and face: Denies mucositis or sore throat Respiratory: Denies cough, dyspnea or wheezes Cardiovascular: Denies palpitation, chest discomfort or lower extremity swelling Gastrointestinal:  Denies nausea, heartburn or change in bowel habits Skin: Denies abnormal skin rashes Lymphatics: Denies new lymphadenopathy or easy bruising Neurological:Denies numbness, tingling or new weaknesses Behavioral/Psych: Mood is stable, no new changes  All other systems were reviewed with the patient and are negative.  I have reviewed the past medical history, past surgical history, social history and family history with the patient and they are unchanged from previous note.  ALLERGIES:  is allergic to other, demerol [meperidine], morphine and related, oxycodone, penicillins, strawberry flavor, tylenol [acetaminophen], and ibuprofen.  MEDICATIONS:  Current Outpatient Medications  Medication Sig Dispense Refill   acetaminophen (TYLENOL) 325 MG tablet Take 325 mg by mouth as needed.     amLODipine (NORVASC) 10 MG tablet Take 1 tablet (10 mg total) by mouth daily. 30 tablet 3   atenolol (TENORMIN) 50 MG tablet Take 1  tablet (  50 mg total) by mouth daily. 30 tablet 3   docusate sodium 100 MG CAPS Take 100 mg by mouth 2 (two) times daily as needed for mild constipation. 10 capsule 0   doxazosin (CARDURA) 2 MG tablet Take 1 tablet (2 mg total) by mouth daily. 30 tablet 3   HYDROcodone-acetaminophen (NORCO/VICODIN) 5-325 MG tablet Take 1 tablet by mouth every 6 (six) hours as needed for moderate pain. 90 tablet 0   lidocaine (LIDODERM) 5 % PLACE 1 PATCH ONTO THE SKIN EVERY DAY REMOVE AND DISCARD WITHIN 12 HOURS OR AS DIRECTED BY PRESCRIBER 90 patch 1   lidocaine-prilocaine (EMLA) cream Apply to affected area once 30 g 3   losartan (COZAAR) 50 MG tablet Take 1.5 tablets (75 mg total) by mouth daily. 45 tablet 3   ondansetron (ZOFRAN) 8 MG tablet Take 1 tablet (8 mg total) by mouth every 8 (eight) hours as needed. Start on the third day after chemotherapy. 30 tablet 1   pregabalin (LYRICA) 200 MG capsule Take 1 capsule (200 mg total) by mouth every 8 (eight) hours. Please give patient brand name Lyrica 90 capsule 4   prochlorperazine (COMPAZINE) 10 MG tablet Take 1 tablet (10 mg total) by mouth every 6 (six) hours as needed (Nausea or vomiting). 30 tablet 1   No current facility-administered medications for this visit.    SUMMARY OF ONCOLOGIC HISTORY: Oncology History Overview Note  BRCA1 positive   Cancer of right colon ([place])  09/16/2014 Imaging   CT abd/pel:  a 3.8cm cecal mass with associated ileocecal intussusception, and a 9.1cm solid and cystic mass in the left pelvis,   09/18/2014 Surgery   right hemicolectomy and terminal ileoectomy   09/18/2014 Pathologic Stage   pT3pN1Mx, tumor extends into pericolonic soft tissue and is less than 48m from the serosal surface, LVI 8-), PNI (-), all 23 node negative, one soft tissue tumor deposit, surgical margins negative.    09/18/2014 Initial Diagnosis   Adenocarcinoma of right colon   08/06/2016 Imaging   CT chest/abd/pelvis with contrast IMPRESSION: 1. No  recurrent malignancy identified. 2. 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 On the prior exam from 09/13/2015 there was back flow of contrast in the left external iliac artery to supply the left internal iliac artery ; this back flow of contrast is no longer present in the left external iliac artery is occluded. 3. Several pulmonary nodules at or below 4 mm diameter, unchanged. 4. 3 mm hypodensity posteriorly in the body of the pancreas, not well seen previously and subtle today, likely incidental, but merits observation. 5. 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 Prominent degenerative spurring of both acetabula. 6. Lumbar spondylosis and degenerative disc disease causing prominent impingement at L4-5 and mild impingement at L5-S1.   03/08/2017 Imaging   CT CAP W Contrast 03/08/17 IMPRESSION: Chest Impression: 1. Stable small pulmonary nodules within LEFT lung. 2. No mediastinal lymphadenopathy.  Abdomen / Pelvis Impression: 1. No evidence of metastatic colorectal cancer or ovarian cancer within the abdomen pelvis. 2. No lymphadenopathy. 3. Postsurgical change consistent RIGHT hemicolectomy and hysterectomy 4. A fem-fem arterial bypass noted.   11/13/2017 Imaging   CT AP w contrast IMPRESSION: 1. No findings of recurrent malignancy in the abdomen or pelvis. 2. Other imaging findings of potential clinical significance:Chronic AVN of both femoral heads without contour abnormality. Lumbar impingement at L4-5 and L5-S1. Aortic Atherosclerosis (ICD10-I70.0). 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 Partial right hemicolectomy. Hysterectomy.   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 ([place])  10/09/2014 Imaging   CT of the abdomen and pelvis showed a 9.1 cm solid and cystic mass in the left pelvis concerning for malignancy. This was  discovered during her workup for colon cancer.   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 Tumor size 3.7 cm, no lymph nodes removed. T1cNx. Tumor cells are positive for cytokeratin 7 and estrogen receptor.   11/01/2014 Surgery   B/l Salpingo-oophorectomy.   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 Stable small pulmonary nodules within LEFT lung. 2. No mediastinal lymphadenopathy.  Abdomen / Pelvis Impression:  1. No evidence of metastatic colorectal cancer or ovarian cancer within the abdomen pelvis. 2. No lymphadenopathy. 3. Postsurgical change consistent RIGHT hemicolectomy and hysterectomy 4. A fem-fem arterial bypass noted.   03/12/2017 Mammogram   MM Right Breast 03/12/17 IMPRESSION: No mammographic or sonographic evidence of malignancy. No correlate identified for the abnormal enhancement seen within the right nipple on MRI of 02/27/2017.    04/02/2017 Pathology Results   Surgical Pathology of Nipple Biopsy: 04/02/17 Diagnosis Nipple Biopsy, Right - BENIGN NIPPLE TISSUE. - NO ATYPIA OR MALIGNANCY.    11/13/2017 Imaging   1. No findings of recurrent malignancy in the abdomen or pelvis. 2. Other imaging findings of potential clinical significance: Chronic AVN of both femoral heads without contour abnormality. Lumbar impingement at L4-5 and L5-S1. Aortic Atherosclerosis (ICD10-I70.0). 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 Partial right hemicolectomy. Hysterectomy.   03/22/2018 Mammogram   Screening mammogram IMPRESSION No evidence of  malignancy.     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   06/06/2019 Imaging   1. Tiny bilateral pulmonary nodules stable since 11/12/2018. Continued close attention on follow-up recommended. 2. Incompletely visualized upper abdominal lymphadenopathy better characterized on the abdomen CT from 2 weeks ago. 3.  Aortic Atherosclerois (ICD10-170.0) 4.  Emphysema. ([id number])   06/07/2019 Procedure   Technically successful CT guided core needle biopsy of indeterminate peritoneal nodule within the right lower abdomen/pelvis.   06/07/2019 Pathology Results   Soft Tissue Needle Core Biopsy, peritoneal nodule within right lower abdomen - METASTATIC CARCINOMA CONSISTENT WITH PATIENT'S CLINICAL HISTORY OF PRIMARY OVARIAN CARCINOMA. SEE NOTE Diagnosis Note Immunohistochemical stains show that the tumor cells are positive for CK7, ER and PAX 8; and negative for CK20 and CDX2. This immunostaining profile is consistent with above diagnosis.   06/16/2019 Tumor Marker   Patient's tumor was tested for the following markers: CA-125 Results of the tumor marker test revealed 499   06/20/2019 - 11/14/2019 Chemotherapy   The patient had carboplatin and taxol   07/11/2019 Tumor Marker   Patient's tumor was tested for the following markers: CA-125 Results of the tumor marker test revealed 438   08/05/2019 Tumor  Marker   Patient's tumor was tested for the following markers: CA-125 Results of the tumor marker test revealed 272.   08/25/2019 Imaging   Ct chest, abdomen and pelvis 1. Slight interval decrease in size of a right lower quadrant peritoneal nodule measuring 1.7 x 1.6 cm, previously 2.2 x 2.2 cm (series 2, image 94, series 5, image 58). Findings are consistent with treatment response.   2.  No significant change in ill-defined soft tissue about the sigmoid colon in the hysterectomy bed (series 2, image 101).   3.  Status post hysterectomy.   4. No evidence of metastatic disease in the chest. Stable benign small pulmonary nodules.   5.  Coronary artery disease.  Aortic Atherosclerosis (ICD10-I70.0).     08/26/2019 Tumor Marker   Patient's tumor was tested for the following markers: CA-125 Results of the tumor marker test revealed 189.   09/26/2019 Tumor Marker   Patient's tumor was tested for the following markers: CA-125 Results of the tumor marker test revealed 137   10/03/2019 Tumor Marker   Patient's tumor was tested for the following markers: CA-125 Results of the tumor marker test revealed 137.   10/31/2019 Tumor Marker   Patient's tumor was tested for the following markers: CA-125 Results of the tumor marker test revealed 101   11/29/2019 Imaging   1. Right lower quadrant peritoneal implant is decreased. Stable irregular nodular pelvic peritoneal thickening superior to the vaginal cuff. 2. No new or progressive metastatic disease. No abdominopelvic lymphadenopathy. 3.  Aortic Atherosclerosis (ICD10-I70.0).   12/15/2019 -  Chemotherapy   The patient had Lynparza for chemotherapy treatment.     12/23/2019 Tumor Marker   Patient's tumor was tested for the following markers: CA-125 Results of the tumor marker test revealed 55.9   01/02/2020 Tumor Marker   Patient's tumor was tested for the following markers: CA-125. Results of the tumor marker test revealed 58.6   02/03/2020 Tumor Marker   Patient's tumor was tested for the following markers: CA-125 Results of the tumor marker test revealed 52.2   02/24/2020 Tumor Marker   Patient's tumor was tested for the following markers: CA-125 Results of the tumor marker test revealed 64.8   04/19/2020 Imaging   1. Continued slight interval decrease in size of a soft tissue nodule in the right paracolic gutter, consistent  with treatment response. 2. Soft tissue in the hysterectomy bed near the distal sigmoid colon and rectum may be slightly decreased in size, difficult to measure due to configuration but apparently diminished in size, also consistent with treatment response. 3. Status post hysterectomy and oophorectomy as well as right colon resection. 4. No evidence of new metastatic disease in the abdomen or pelvis. 5. Redemonstrated long segment occlusion of the left common, internal, and external iliac arteries. Status post femoral-femoral bypass grafting with reconstitution of the left common femoral artery. Aortic Atherosclerosis (ICD10-I70.0).   08/15/2020 Tumor Marker   Patient's tumor was tested for the following markers: CA-125 Results of the tumor marker test revealed 47.1   10/22/2020 Imaging   1. Reduced size of the nodule along the right paracolic gutter, currently 0.6 by 1.2 cm, previously 0.7 by 1.4 cm on 04/19/2020 and previously 0.9 by 1.5 cm on 11/29/2019. Appearance compatible with treatment response. 2. Further mild reduction in size of the bandlike density along the rectosigmoid mesentery, probably from scarring or effectively treated tumor. 3. Prominent stool throughout the colon favors constipation.  4. Stable chronic occlusion of the left iliac arteries, with  a patent fem-fem bypass. 5. 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 6. Lower lumbar spondylosis and degenerative disc disease causing impingement at L4-5 and L5-S1. 7. Aortic atherosclerosis.     10/23/2020 Tumor Marker   Patient's tumor was tested for the following markers: CA-125 Results of the tumor marker test revealed 74.6   01/21/2021 Tumor Marker   Patient's tumor was tested for the following markers: CA-125 Results of the tumor marker test revealed 825   01/28/2021 Imaging   1. Changes of right hemicolectomy with slightly decreased size of the  nodule along the right pericolic gutter and similar band like density along the rectosigmoid mesentery. 2. Two new well-circumscribed low-density nodules in the presacral perirectal space, difficult to ascertain whether they arise from the bowel wall or arise from within the peritoneum. This is a nonspecific finding which could be further evaluated with colonoscopy, repeat CT with rectal contrast or attention on close interval follow-up imaging in 3 months. 3. 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 4. 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 5. Emphysema and aortic atherosclerosis. Aortic Atherosclerosis (ICD10-I70.0) and Emphysema (ICD10-J43.9).   02/08/2021 Procedure   Successful placement of a right internal jugular approach power injectable Port-A-Cath. The catheter is ready for immediate use.     02/11/2021 - 06/06/2021 Chemotherapy          02/18/2021 Tumor Marker   Patient's tumor was tested for the following markers: CA-125 Results of the tumor marker test revealed 852   03/04/2021 Tumor Marker   Patient's tumor was tested for the following markers: CA-125. Results of the tumor marker test revealed 541   03/11/2021 Tumor Marker   Patient's tumor was tested for the following markers: CA-125 Results of the tumor marker test revealed 568   03/25/2021 Tumor Marker   Patient's tumor was tested for the following markers:CA-125 Results of the tumor marker test revealed 367.   04/08/2021 Tumor Marker   Patient's tumor was tested for the following markers: CA-125 Results of the tumor marker test revealed 273   04/29/2021 Tumor Marker   Patient's tumor was tested for the following markers: CA-125 Results of the tumor marker test revealed 431   06/06/2021 Tumor Marker   Patient's tumor was tested for the following markers: CA-125. Results of the tumor marker test revealed 594.   06/18/2021 Imaging   1. Nodular fascial  thickening with subtle increase along the RIGHT pericolic gutter as described. Short interval follow-up may be helpful. 2. Stable appearance of fascial thickening and serosal thickening in the pelvis, potentially indicative of peritoneal disease. 3. Post RIGHT hemicolectomy. 4. Stable chronic occlusion of LEFT iliac vasculature with patent femoral femoral bypass grafting. 5. 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 6. Aortic atherosclerosis.   Aortic Atherosclerosis (ICD10-I70.0).     07/16/2021 -  Chemotherapy    Patient is on Treatment Plan: OVARIAN BEVACIZUMAB Q21D       07/17/2021 Tumor Marker   Patient's tumor was tested for the following markers: CA-125. Results of the tumor marker test revealed 996.     PHYSICAL EXAMINATION: ECOG PERFORMANCE STATUS: 1 - Symptomatic but completely ambulatory  Vitals:   08/06/21 1243  BP: (!) 164/93  Pulse: 77  Resp: 18  Temp: 97.9 F (36.6 C)  SpO2: 100%   Filed Weights   08/06/21 1243  Weight: 136 lb 6.4 oz (  61.9 kg)    GENERAL:alert, no distress and comfortable SKIN: skin color, texture, turgor are normal, no rashes or significant lesions EYES: normal, Conjunctiva are pink and non-injected, sclera clear OROPHARYNX:no exudate, no erythema and lips, buccal mucosa, and tongue normal  NECK: supple, thyroid normal size, non-tender, without nodularity LYMPH:  no palpable lymphadenopathy in the cervical, axillary or inguinal LUNGS: clear to auscultation and percussion with normal breathing effort HEART: regular rate & rhythm and no murmurs and no lower extremity edema ABDOMEN:abdomen soft, non-tender and normal bowel sounds Musculoskeletal:no cyanosis of digits and no clubbing  NEURO: alert & oriented x 3 with fluent speech, no focal motor/sensory deficits  LABORATORY DATA:  I have reviewed the data as listed    Component Value Date/Time   NA 142 08/06/2021 1207   NA 141 10/07/2017 1436   K 3.8  08/06/2021 1207   K 4.4 10/07/2017 1436   CL 106 08/06/2021 1207   CO2 26 08/06/2021 1207   CO2 26 10/07/2017 1436   GLUCOSE 93 08/06/2021 1207   GLUCOSE 85 10/07/2017 1436   BUN 16 08/06/2021 1207   BUN 14.9 10/07/2017 1436   CREATININE 0.73 08/06/2021 1207   CREATININE 1.0 10/07/2017 1436   CALCIUM 9.3 08/06/2021 1207   CALCIUM 9.5 10/07/2017 1436   PROT 6.8 08/06/2021 1207   PROT 7.8 10/07/2017 1436   ALBUMIN 3.7 08/06/2021 1207   ALBUMIN 3.9 10/07/2017 1436   AST 20 08/06/2021 1207   AST 15 10/07/2017 1436   ALT 14 08/06/2021 1207   ALT 7 10/07/2017 1436   ALKPHOS 57 08/06/2021 1207   ALKPHOS 81 10/07/2017 1436   BILITOT 0.9 08/06/2021 1207   BILITOT 0.54 10/07/2017 1436   GFRNONAA >60 08/06/2021 1207   GFRAA >60 08/15/2020 1121    No results found for: SPEP, UPEP  Lab Results  Component Value Date   WBC 5.9 08/06/2021   NEUTROABS 3.0 08/06/2021   HGB 11.0 (L) 08/06/2021   HCT 31.6 (L) 08/06/2021   MCV 105.0 (H) 08/06/2021   PLT 76 (L) 08/06/2021      Chemistry      Component Value Date/Time   NA 142 08/06/2021 1207   NA 141 10/07/2017 1436   K 3.8 08/06/2021 1207   K 4.4 10/07/2017 1436   CL 106 08/06/2021 1207   CO2 26 08/06/2021 1207   CO2 26 10/07/2017 1436   BUN 16 08/06/2021 1207   BUN 14.9 10/07/2017 1436   CREATININE 0.73 08/06/2021 1207   CREATININE 1.0 10/07/2017 1436      Component Value Date/Time   CALCIUM 9.3 08/06/2021 1207   CALCIUM 9.5 10/07/2017 1436   ALKPHOS 57 08/06/2021 1207   ALKPHOS 81 10/07/2017 1436   AST 20 08/06/2021 1207   AST 15 10/07/2017 1436   ALT 14 08/06/2021 1207   ALT 7 10/07/2017 1436   BILITOT 0.9 08/06/2021 1207   BILITOT 0.54 10/07/2017 1436

## 2021-08-06 NOTE — Assessment & Plan Note (Signed)
She is currently taking 4 different antihypertensives and her hypertension remained uncontrolled with heavy proteinuria I plan to increase the dose of atenolol to 50 mg daily We will call her next week for further follow-up She is instructed to continue to monitor her blood pressure closely

## 2021-08-06 NOTE — Assessment & Plan Note (Signed)
This is due to her recent treatment She is not symptomatic She does not need transfusion support Observe closely I suspect her worsening thrombocytopenia is due to uncontrolled hypertension

## 2021-08-06 NOTE — Assessment & Plan Note (Signed)
We are not able to proceed with treatment today due to pancytopenia and uncontrolled hypertension with proteinuria Plan to increase the dose of her antihypertensives and call her next week for further management In the meantime, continue supportive care If we are not able to resume her treatment within the next month, I will abandon the plan for maintenance bevacizumab

## 2021-08-07 LAB — CA 125: Cancer Antigen (CA) 125: 975 U/mL — ABNORMAL HIGH (ref 0.0–38.1)

## 2021-08-12 ENCOUNTER — Telehealth: Payer: Self-pay | Admitting: *Deleted

## 2021-08-12 NOTE — Telephone Encounter (Signed)
-----   Message from Heath Lark, MD sent at 08/12/2021  7:56 AM EDT -----  ----- Message ----- From: Heath Lark, MD Sent: 08/12/2021   7:55 AM EDT To: Flo Shanks, RN  Pls contact her for BP readings

## 2021-08-12 NOTE — Telephone Encounter (Signed)
Awaiting call back from pt with BP readings

## 2021-08-12 NOTE — Telephone Encounter (Signed)
Pt called to give BP and pulse readings for the week           Morning              Evening 9/14  146/93  71          103/73  56 9/15  140/86  68           98/66   55 9/16  135/84  84 9/17  141/85  68 9/18  138/90  65 9/19  155/76  72   She states she has been taking the reading before she took her BP medications.  This note will be forwarded to Dr Alvy Bimler.

## 2021-08-13 NOTE — Telephone Encounter (Signed)
Looks good Continue the same BP meds

## 2021-08-14 ENCOUNTER — Other Ambulatory Visit: Payer: Self-pay | Admitting: Hematology and Oncology

## 2021-08-14 ENCOUNTER — Telehealth: Payer: Self-pay

## 2021-08-14 DIAGNOSIS — C562 Malignant neoplasm of left ovary: Secondary | ICD-10-CM

## 2021-08-14 NOTE — Telephone Encounter (Signed)
Patient called to inquire about next appointment for treatment.

## 2021-08-16 ENCOUNTER — Telehealth: Payer: Self-pay

## 2021-08-16 ENCOUNTER — Other Ambulatory Visit: Payer: Self-pay | Admitting: Hematology and Oncology

## 2021-08-16 ENCOUNTER — Encounter: Payer: Self-pay | Admitting: Hematology and Oncology

## 2021-08-16 MED ORDER — HYDROCODONE-ACETAMINOPHEN 5-325 MG PO TABS: 1.0000 | ORAL_TABLET | Freq: Four times a day (QID) | ORAL | 0 refills | Status: DC | PRN

## 2021-08-16 NOTE — Telephone Encounter (Signed)
Catherine Rogers,  Please let her know we cannot schedule return appt until her BP comes down Can you get some BP readings when you call her today?

## 2021-08-16 NOTE — Telephone Encounter (Signed)
Left message for call back.  Re: Appointment/blood pressure.

## 2021-08-16 NOTE — Telephone Encounter (Signed)
BP readings:   9/20  BP 144/93; HR 75     9/21  BP 142/91 HR 65 9/22  BP 123/86; HR 68     9/23  BP 115/82; HR 76    Pt requesting refill on Norco to [facility].  Last Rx 8/23.

## 2021-08-16 NOTE — Telephone Encounter (Signed)
Looks good, I sent refill and scheduling msg to reschedule treatment and see me next week

## 2021-08-16 NOTE — Telephone Encounter (Signed)
RN notified patient.  Patient verbalized understanding, and will continue to monitor BP readings.   Scheduling message sent.

## 2021-08-20 ENCOUNTER — Telehealth: Payer: Self-pay

## 2021-08-20 ENCOUNTER — Ambulatory Visit: Payer: 59

## 2021-08-20 ENCOUNTER — Other Ambulatory Visit: Payer: 59

## 2021-08-20 ENCOUNTER — Ambulatory Visit: Payer: 59 | Admitting: Hematology and Oncology

## 2021-08-20 NOTE — Telephone Encounter (Signed)
She was not aware that she had appts scheduled today. Appts canceled and scheduling message sent to reschedule.

## 2021-08-20 NOTE — Telephone Encounter (Signed)
Called and left a message asking her to call the office regarding today's appts. She has not arrived for appts.

## 2021-08-27 ENCOUNTER — Ambulatory Visit: Payer: 59

## 2021-08-27 ENCOUNTER — Other Ambulatory Visit: Payer: 59

## 2021-08-27 ENCOUNTER — Ambulatory Visit: Payer: 59 | Admitting: Hematology and Oncology

## 2021-08-29 ENCOUNTER — Other Ambulatory Visit: Payer: Self-pay

## 2021-08-29 ENCOUNTER — Encounter: Payer: Self-pay | Admitting: Hematology and Oncology

## 2021-08-29 ENCOUNTER — Inpatient Hospital Stay: Payer: 59 | Attending: Hematology and Oncology

## 2021-08-29 ENCOUNTER — Inpatient Hospital Stay: Payer: 59

## 2021-08-29 ENCOUNTER — Inpatient Hospital Stay (HOSPITAL_BASED_OUTPATIENT_CLINIC_OR_DEPARTMENT_OTHER): Payer: 59 | Admitting: Hematology and Oncology

## 2021-08-29 VITALS — BP 117/73 | HR 50

## 2021-08-29 DIAGNOSIS — I10 Essential (primary) hypertension: Secondary | ICD-10-CM | POA: Diagnosis not present

## 2021-08-29 DIAGNOSIS — C562 Malignant neoplasm of left ovary: Secondary | ICD-10-CM

## 2021-08-29 DIAGNOSIS — Z90722 Acquired absence of ovaries, bilateral: Secondary | ICD-10-CM | POA: Diagnosis not present

## 2021-08-29 DIAGNOSIS — Z9221 Personal history of antineoplastic chemotherapy: Secondary | ICD-10-CM | POA: Diagnosis not present

## 2021-08-29 DIAGNOSIS — Z9079 Acquired absence of other genital organ(s): Secondary | ICD-10-CM | POA: Diagnosis not present

## 2021-08-29 DIAGNOSIS — Z90710 Acquired absence of both cervix and uterus: Secondary | ICD-10-CM | POA: Insufficient documentation

## 2021-08-29 DIAGNOSIS — Z85038 Personal history of other malignant neoplasm of large intestine: Secondary | ICD-10-CM | POA: Diagnosis not present

## 2021-08-29 DIAGNOSIS — C182 Malignant neoplasm of ascending colon: Secondary | ICD-10-CM

## 2021-08-29 DIAGNOSIS — C786 Secondary malignant neoplasm of retroperitoneum and peritoneum: Secondary | ICD-10-CM | POA: Insufficient documentation

## 2021-08-29 DIAGNOSIS — Z79899 Other long term (current) drug therapy: Secondary | ICD-10-CM | POA: Diagnosis not present

## 2021-08-29 DIAGNOSIS — D61818 Other pancytopenia: Secondary | ICD-10-CM | POA: Diagnosis not present

## 2021-08-29 LAB — CMP (CANCER CENTER ONLY)
ALT: 9 U/L (ref 0–44)
AST: 18 U/L (ref 15–41)
Albumin: 4 g/dL (ref 3.5–5.0)
Alkaline Phosphatase: 63 U/L (ref 38–126)
Anion gap: 9 (ref 5–15)
BUN: 18 mg/dL (ref 6–20)
CO2: 23 mmol/L (ref 22–32)
Calcium: 9.4 mg/dL (ref 8.9–10.3)
Chloride: 108 mmol/L (ref 98–111)
Creatinine: 0.81 mg/dL (ref 0.44–1.00)
GFR, Estimated: >60 mL/min (ref >60)
Glucose, Bld: 100 mg/dL — ABNORMAL HIGH (ref 70–99)
Potassium: 4.2 mmol/L (ref 3.5–5.1)
Sodium: 140 mmol/L (ref 135–145)
Total Bilirubin: 0.5 mg/dL (ref 0.3–1.2)
Total Protein: 7.2 g/dL (ref 6.5–8.1)

## 2021-08-29 LAB — CBC WITH DIFFERENTIAL (CANCER CENTER ONLY)
Abs Immature Granulocytes: 0 10*3/uL (ref 0.00–0.07)
Basophils Absolute: 0 10*3/uL (ref 0.0–0.1)
Basophils Relative: 0 %
Eosinophils Absolute: 0 10*3/uL (ref 0.0–0.5)
Eosinophils Relative: 1 %
HCT: 35.5 % — ABNORMAL LOW (ref 36.0–46.0)
Hemoglobin: 12.7 g/dL (ref 12.0–15.0)
Immature Granulocytes: 0 %
Lymphocytes Relative: 51 %
Lymphs Abs: 2.4 10*3/uL (ref 0.7–4.0)
MCH: 36.7 pg — ABNORMAL HIGH (ref 26.0–34.0)
MCHC: 35.8 g/dL (ref 30.0–36.0)
MCV: 102.6 fL — ABNORMAL HIGH (ref 80.0–100.0)
Monocytes Absolute: 0.3 10*3/uL (ref 0.1–1.0)
Monocytes Relative: 7 %
Neutro Abs: 1.9 10*3/uL (ref 1.7–7.7)
Neutrophils Relative %: 41 %
Platelet Count: 103 10*3/uL — ABNORMAL LOW (ref 150–400)
RBC: 3.46 MIL/uL — ABNORMAL LOW (ref 3.87–5.11)
RDW: 12.9 % (ref 11.5–15.5)
WBC Count: 4.7 10*3/uL (ref 4.0–10.5)
nRBC: 0 % (ref 0.0–0.2)

## 2021-08-29 LAB — TOTAL PROTEIN, URINE DIPSTICK: Protein, ur: 300 mg/dL — AB

## 2021-08-29 MED ORDER — SODIUM CHLORIDE 0.9 % IV SOLN: Freq: Once | INTRAVENOUS | Status: DC

## 2021-08-29 MED ORDER — SODIUM CHLORIDE 0.9% FLUSH
10.0000 mL | Freq: Once | INTRAVENOUS | Status: AC
  Administered 2021-08-29: 10 mL

## 2021-08-29 MED ORDER — SODIUM CHLORIDE 0.9 % IV SOLN: 5.0000 mg/kg | Freq: Once | INTRAVENOUS | Status: DC

## 2021-08-29 MED ORDER — SODIUM CHLORIDE 0.9% FLUSH
10.0000 mL | INTRAVENOUS | Status: DC | PRN
  Administered 2021-08-29: 10 mL

## 2021-08-29 MED ORDER — HEPARIN SOD (PORK) LOCK FLUSH 100 UNIT/ML IV SOLN
500.0000 [IU] | Freq: Once | INTRAVENOUS | Status: AC | PRN
  Administered 2021-08-29: 500 [IU]

## 2021-08-29 NOTE — Progress Notes (Signed)
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 [facility]) She tolerated previous treatment poorly complicated by pancytopenia and uncontrolled hypertension There has been some delay with her recent treatment Her blood counts are satisfactory and her blood pressure is well controlled now We will resume treatment at reduced dose of bevacizumab 5 mg/kg dose I plan to space out her treatment to every 4 weeks and she is in agreement She is advised to continue to monitor her blood pressure closely  Pancytopenia, acquired [facility]) This is due to her recent treatment She is not symptomatic She does not need transfusion support Observe closely I suspect her worsening thrombocytopenia is due to uncontrolled hypertension  Essential hypertension With recent aggressive management of her hypertension, her blood pressure is now looking good I plan to reduce bevacizumab to 5 mg/kg We will call her next week for further follow-up She is instructed to continue to monitor her blood pressure closely  No orders of the defined types were placed in this encounter.   All questions were answered. The patient knows to call the clinic with any problems, questions or concerns. The total time spent in the appointment was 20 minutes encounter with patients including review of chart and various tests results, discussions about plan of care and coordination of care plan   Heath Lark, MD 08/29/2021 2:11 PM  INTERVAL HISTORY: Please see below for problem oriented charting. she returns for treatment follow-up on bevacizumab for  recurrent ovarian cancer Her blood pressure today is good She is taking her blood pressure medications as directed She denies abdominal pain no changes in bowel habits Her chronic neuropathic pain is stable  REVIEW OF SYSTEMS:   Constitutional: Denies fevers, chills or abnormal weight loss Eyes: Denies blurriness of vision Ears, nose, mouth, throat, and face: Denies mucositis or sore throat Respiratory: Denies cough, dyspnea or wheezes Cardiovascular: Denies palpitation, chest discomfort or lower extremity swelling Gastrointestinal:  Denies nausea, heartburn or change in bowel habits Skin: Denies abnormal skin rashes Lymphatics: Denies new lymphadenopathy or easy bruising Neurological:Denies numbness, tingling or new weaknesses Behavioral/Psych: Mood is stable, no new changes  All other systems were reviewed with the patient and are negative.  I have reviewed the past medical history, past surgical history, social history and family history with the patient and they are unchanged from previous note.  ALLERGIES:  is allergic to other, demerol [meperidine], morphine and related, oxycodone, penicillins, strawberry flavor, tylenol [acetaminophen], and ibuprofen.  MEDICATIONS:  Current Outpatient Medications  Medication Sig Dispense Refill   acetaminophen (TYLENOL) 325 MG tablet Take 325 mg by mouth as needed.     amLODipine (NORVASC) 10 MG tablet Take 1 tablet (10 mg total) by mouth daily. 30 tablet 3   atenolol (TENORMIN) 50 MG tablet Take 1 tablet (50 mg total) by mouth daily. 30 tablet 3   docusate sodium 100 MG CAPS Take 100 mg by mouth 2 (two) times daily as needed for mild constipation. 10 capsule 0   doxazosin (CARDURA) 2 MG tablet Take 1 tablet (2 mg total) by mouth daily. 30 tablet 3   HYDROcodone-acetaminophen (NORCO/VICODIN) 5-325 MG tablet Take 1 tablet by mouth every 6 (six) hours  as needed for moderate pain. 90 tablet 0   lidocaine (LIDODERM) 5 % PLACE 1 PATCH ONTO THE SKIN  EVERY DAY REMOVE AND DISCARD WITHIN 12 HOURS OR AS DIRECTED BY PRESCRIBER 90 patch 1   lidocaine-prilocaine (EMLA) cream Apply to affected area once 30 g 3   losartan (COZAAR) 50 MG tablet Take 1.5 tablets (75 mg total) by mouth daily. 45 tablet 3   ondansetron (ZOFRAN) 8 MG tablet TAKE 1 TABLET BY MOUTH EVERY 8 HOURS AS NEEDED. START ON THE 3RD DAY AFTER CHEMOTHERAPY 30 tablet 4   pregabalin (LYRICA) 200 MG capsule Take 1 capsule (200 mg total) by mouth every 8 (eight) hours. Please give patient brand name Lyrica 90 capsule 4   prochlorperazine (COMPAZINE) 10 MG tablet Take 1 tablet (10 mg total) by mouth every 6 (six) hours as needed (Nausea or vomiting). 30 tablet 1   No current facility-administered medications for this visit.    SUMMARY OF ONCOLOGIC HISTORY: Oncology History Overview Note  BRCA1 positive   Cancer of right colon ([place])  09/16/2014 Imaging   CT abd/pel:  a 3.8cm cecal mass with associated ileocecal intussusception, and a 9.1cm solid and cystic mass in the left pelvis,   09/18/2014 Surgery   right hemicolectomy and terminal ileoectomy   09/18/2014 Pathologic Stage   pT3pN1Mx, tumor extends into pericolonic soft tissue and is less than 69m from the serosal surface, LVI 8-), PNI (-), all 23 node negative, one soft tissue tumor deposit, surgical margins negative.    09/18/2014 Initial Diagnosis   Adenocarcinoma of right colon   08/06/2016 Imaging   CT chest/abd/pelvis with contrast IMPRESSION: 1. No recurrent malignancy identified. 2. 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 On the prior exam from 09/13/2015 there was back flow of contrast in the left external iliac artery to supply the left internal iliac artery ; this back flow of contrast is no longer present in the left external iliac artery is occluded. 3. Several pulmonary nodules at or below 4 mm diameter, unchanged. 4. 3 mm hypodensity  posteriorly in the body of the pancreas, not well seen previously and subtle today, likely incidental, but merits observation. 5. 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 Prominent degenerative spurring of both acetabula. 6. Lumbar spondylosis and degenerative disc disease causing prominent impingement at L4-5 and mild impingement at L5-S1.   03/08/2017 Imaging   CT CAP W Contrast 03/08/17 IMPRESSION: Chest Impression: 1. Stable small pulmonary nodules within LEFT lung. 2. No mediastinal lymphadenopathy.  Abdomen / Pelvis Impression: 1. No evidence of metastatic colorectal cancer or ovarian cancer within the abdomen pelvis. 2. No lymphadenopathy. 3. Postsurgical change consistent RIGHT hemicolectomy and hysterectomy 4. A fem-fem arterial bypass noted.   11/13/2017 Imaging   CT AP w contrast IMPRESSION: 1. No findings of recurrent malignancy in the abdomen or pelvis. 2. Other imaging findings of potential clinical significance:Chronic AVN of both femoral heads without contour abnormality. Lumbar impingement at L4-5 and L5-S1. Aortic Atherosclerosis (ICD10-I70.0). 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 Partial right hemicolectomy. Hysterectomy.   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 ([place]  10/09/2014 Imaging   CT of the abdomen and pelvis showed a 9.1 cm solid and cystic mass in the left pelvis concerning for malignancy. This was discovered during her workup for colon cancer.   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 Tumor size 3.7 cm, no lymph nodes removed. T1cNx. Tumor cells are positive for cytokeratin 7 and estrogen receptor.   11/01/2014 Surgery   B/l Salpingo-oophorectomy.   11/01/2014 Initial Diagnosis   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   11/28/2014 Cancer Staging   Staging form: Ovary, AJCC  7th Edition - Clinical: Stage III (rT3, N0, M0) - Signed by Heath Lark, MD on 06/09/2019   03/08/2017 Imaging   CT CAP W Contrast 03/08/17 IMPRESSION: Chest Impression:  1. Stable small pulmonary nodules within LEFT lung. 2. No mediastinal lymphadenopathy.  Abdomen / Pelvis Impression:  1. No evidence of metastatic colorectal cancer or ovarian cancer within the abdomen pelvis. 2. No lymphadenopathy. 3. Postsurgical change consistent RIGHT hemicolectomy and hysterectomy 4. A fem-fem arterial bypass noted.   03/12/2017 Mammogram   MM Right Breast 03/12/17 IMPRESSION: No mammographic or sonographic evidence of malignancy. No correlate identified for the abnormal enhancement seen within the right nipple on MRI of 02/27/2017.    04/02/2017 Pathology Results   Surgical Pathology of Nipple Biopsy: 04/02/17 Diagnosis Nipple Biopsy, Right - BENIGN NIPPLE TISSUE. - NO ATYPIA OR MALIGNANCY.    11/13/2017 Imaging   1. No findings of recurrent malignancy in the abdomen or pelvis. 2. Other imaging findings of potential clinical significance: Chronic AVN of both femoral heads without contour abnormality. Lumbar impingement at L4-5 and L5-S1. Aortic Atherosclerosis (ICD10-I70.0). 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 Partial right hemicolectomy. Hysterectomy.   03/22/2018 Mammogram   Screening mammogram IMPRESSION No evidence of malignancy.     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   06/06/2019 Imaging   1. Tiny bilateral pulmonary nodules stable since 11/12/2018. Continued close attention on follow-up recommended. 2. Incompletely visualized upper abdominal lymphadenopathy better characterized on  the abdomen CT from 2 weeks ago. 3.  Aortic Atherosclerois (ICD10-170.0) 4.  Emphysema. ([id number]-F79.9)   06/07/2019 Procedure   Technically successful CT guided core needle biopsy of indeterminate peritoneal nodule within the right lower abdomen/pelvis.   06/07/2019 Pathology Results   Soft Tissue Needle Core Biopsy, peritoneal nodule within right lower abdomen - METASTATIC CARCINOMA CONSISTENT WITH PATIENT'S CLINICAL HISTORY OF PRIMARY OVARIAN CARCINOMA. SEE NOTE Diagnosis Note Immunohistochemical stains show that the tumor cells are positive for CK7, ER and PAX 8; and negative for CK20 and CDX2. This immunostaining profile is consistent with above diagnosis.   06/16/2019 Tumor Marker   Patient's tumor was tested for the following markers: CA-125 Results of the tumor marker test revealed 499   06/20/2019 - 11/14/2019 Chemotherapy   The patient had carboplatin and taxol   07/11/2019 Tumor Marker   Patient's tumor was tested for the following markers: CA-125 Results of the tumor marker test revealed 438   08/05/2019 Tumor Marker   Patient's tumor was tested for the following markers: CA-125 Results of the tumor marker test revealed 272.   08/25/2019 Imaging   Ct chest, abdomen and pelvis 1. Slight interval decrease in size of a right lower quadrant peritoneal nodule measuring 1.7 x 1.6 cm, previously 2.2 x 2.2 cm (series 2, image 94, series 5, image 58). Findings are consistent with treatment response.   2.  No significant change in ill-defined soft tissue about the sigmoid colon in the hysterectomy bed (series 2, image 101).   3.  Status post hysterectomy.   4. No evidence of metastatic disease in the chest. Stable benign small pulmonary nodules.   5.  Coronary artery disease.  Aortic Atherosclerosis (ICD10-I70.0).     08/26/2019 Tumor Marker   Patient's tumor was tested for the following markers: CA-125 Results of the tumor marker test revealed 189.   09/26/2019 Tumor Marker    Patient's tumor was tested for the following markers: CA-125 Results of the tumor marker test revealed 137   10/03/2019 Tumor Marker   Patient's tumor was tested for the following markers: CA-125 Results of the tumor marker test revealed 137.   10/31/2019 Tumor Marker   Patient's tumor was tested for the following markers: CA-125 Results of the tumor marker test revealed 101   11/29/2019 Imaging   1. Right lower quadrant peritoneal implant is decreased. Stable irregular nodular pelvic peritoneal thickening superior to the vaginal cuff. 2. No new or progressive metastatic disease. No abdominopelvic lymphadenopathy. 3.  Aortic Atherosclerosis (ICD10-I70.0).   12/15/2019 -  Chemotherapy   The patient had Lynparza for chemotherapy treatment.     12/23/2019 Tumor Marker   Patient's tumor was tested for the following markers: CA-125 Results of the tumor marker test revealed 55.9   01/02/2020 Tumor Marker   Patient's tumor was tested for the following markers: CA-125. Results of the tumor marker test revealed 58.6   02/03/2020 Tumor Marker   Patient's tumor was tested for the following markers: CA-125 Results of the tumor marker test revealed 52.2   02/24/2020 Tumor Marker   Patient's tumor was tested for the following markers: CA-125 Results of the tumor marker test revealed 64.8   04/19/2020 Imaging   1. Continued slight interval decrease in size of a soft tissue nodule in the right paracolic gutter, consistent with treatment response. 2. Soft tissue in the hysterectomy bed near the distal sigmoid colon and rectum may be slightly decreased in size, difficult to measure due to configuration but apparently diminished in size, also consistent with treatment response. 3. Status post hysterectomy and oophorectomy as well as right colon resection. 4. No evidence of new metastatic disease in the abdomen or pelvis. 5. Redemonstrated long segment occlusion of the left common, internal, and external  iliac arteries. Status post femoral-femoral bypass grafting with reconstitution of the left common femoral artery. Aortic Atherosclerosis (ICD10-I70.0).   08/15/2020 Tumor Marker   Patient's tumor was tested for the following markers: CA-125 Results of the tumor marker test revealed 47.1   10/22/2020 Imaging   1. Reduced size of the nodule along the right paracolic gutter, currently 0.6 by 1.2 cm, previously 0.7 by 1.4 cm on 04/19/2020 and previously 0.9 by 1.5 cm on 11/29/2019. Appearance compatible with treatment response. 2. Further mild reduction in size of the bandlike density along the rectosigmoid mesentery, probably from scarring or effectively treated tumor. 3. Prominent stool throughout the colon favors constipation.  4. Stable chronic occlusion of the left iliac arteries, with a patent fem-fem bypass. 5. 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 6. Lower lumbar spondylosis and degenerative disc disease causing impingement at L4-5 and L5-S1. 7. Aortic atherosclerosis.     10/23/2020 Tumor Marker   Patient's tumor was tested for the following markers: CA-125 Results of the tumor marker test revealed 74.6   01/21/2021 Tumor Marker  Patient's tumor was tested for the following markers: CA-125 Results of the tumor marker test revealed 825   01/28/2021 Imaging   1. Changes of right hemicolectomy with slightly decreased size of the nodule along the right pericolic gutter and similar band like density along the rectosigmoid mesentery. 2. Two new well-circumscribed low-density nodules in the presacral perirectal space, difficult to ascertain whether they arise from the bowel wall or arise from within the peritoneum. This is a nonspecific finding which could be further evaluated with colonoscopy, repeat CT with rectal contrast or attention on close interval follow-up imaging in 3 months. 3. Stable chronic occlusion  of the left iliac arteries with patent fem-fem bypass. 4. 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 5. Emphysema and aortic atherosclerosis. Aortic Atherosclerosis (ICD10-I70.0) and Emphysema (ICD10-J43.9).   02/08/2021 Procedure   Successful placement of a right internal jugular approach power injectable Port-A-Cath. The catheter is ready for immediate use.     02/11/2021 - 06/06/2021 Chemotherapy          02/18/2021 Tumor Marker   Patient's tumor was tested for the following markers: CA-125 Results of the tumor marker test revealed 852   03/04/2021 Tumor Marker   Patient's tumor was tested for the following markers: CA-125. Results of the tumor marker test revealed 541   03/11/2021 Tumor Marker   Patient's tumor was tested for the following markers: CA-125 Results of the tumor marker test revealed 568   03/25/2021 Tumor Marker   Patient's tumor was tested for the following markers:CA-125 Results of the tumor marker test revealed 367.   04/08/2021 Tumor Marker   Patient's tumor was tested for the following markers: CA-125 Results of the tumor marker test revealed 273   04/29/2021 Tumor Marker   Patient's tumor was tested for the following markers: CA-125 Results of the tumor marker test revealed 431   06/06/2021 Tumor Marker   Patient's tumor was tested for the following markers: CA-125. Results of the tumor marker test revealed 594.   06/18/2021 Imaging   1. Nodular fascial thickening with subtle increase along the RIGHT pericolic gutter as described. Short interval follow-up may be helpful. 2. Stable appearance of fascial thickening and serosal thickening in the pelvis, potentially indicative of peritoneal disease. 3. Post RIGHT hemicolectomy. 4. Stable chronic occlusion of LEFT iliac vasculature with patent femoral femoral bypass grafting. 5. 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 6. Aortic atherosclerosis.   Aortic  Atherosclerosis (ICD10-I70.0).     07/16/2021 -  Chemotherapy   Patient is on Treatment Plan : Ovarian Bevacizumab q21d     07/17/2021 Tumor Marker   Patient's tumor was tested for the following markers: CA-125. Results of the tumor marker test revealed 996.   08/06/2021 Tumor Marker   Patient's tumor was tested for the following markers: CA-125. Results of the tumor marker test revealed 975.     PHYSICAL EXAMINATION: ECOG PERFORMANCE STATUS: 0 - Asymptomatic GENERAL:alert, no distress and comfortable SKIN: skin color, texture, turgor are normal, no rashes or significant lesions EYES: normal, Conjunctiva are pink and non-injected, sclera clear OROPHARYNX:no exudate, no erythema and lips, buccal mucosa, and tongue normal  NECK: supple, thyroid normal size, non-tender, without nodularity LYMPH:  no palpable lymphadenopathy in the cervical, axillary or inguinal LUNGS: clear to auscultation and percussion with normal breathing effort HEART: regular rate & rhythm and no murmurs and no lower extremity edema ABDOMEN:abdomen soft, non-tender and normal bowel sounds Musculoskeletal:no cyanosis of digits and no  clubbing  NEURO: alert & oriented x 3 with fluent speech, no focal motor/sensory deficits  LABORATORY DATA:  I have reviewed the data as listed    Component Value Date/Time   NA 142 08/06/2021 1207   NA 141 10/07/2017 1436   K 3.8 08/06/2021 1207   K 4.4 10/07/2017 1436   CL 106 08/06/2021 1207   CO2 26 08/06/2021 1207   CO2 26 10/07/2017 1436   GLUCOSE 93 08/06/2021 1207   GLUCOSE 85 10/07/2017 1436   BUN 16 08/06/2021 1207   BUN 14.9 10/07/2017 1436   CREATININE 0.73 08/06/2021 1207   CREATININE 1.0 10/07/2017 1436   CALCIUM 9.3 08/06/2021 1207   CALCIUM 9.5 10/07/2017 1436   PROT 6.8 08/06/2021 1207   PROT 7.8 10/07/2017 1436   ALBUMIN 3.7 08/06/2021 1207   ALBUMIN 3.9 10/07/2017 1436   AST 20 08/06/2021 1207   AST 15 10/07/2017 1436   ALT 14 08/06/2021 1207   ALT  7 10/07/2017 1436   ALKPHOS 57 08/06/2021 1207   ALKPHOS 81 10/07/2017 1436   BILITOT 0.9 08/06/2021 1207   BILITOT 0.54 10/07/2017 1436   GFRNONAA >60 08/06/2021 1207   GFRAA >60 08/15/2020 1121    No results found for: SPEP, UPEP  Lab Results  Component Value Date   WBC 4.7 08/29/2021   NEUTROABS PENDING 08/29/2021   HGB 12.7 08/29/2021   HCT 35.5 (L) 08/29/2021   MCV 102.6 (H) 08/29/2021   PLT 103 (L) 08/29/2021      Chemistry      Component Value Date/Time   NA 142 08/06/2021 1207   NA 141 10/07/2017 1436   K 3.8 08/06/2021 1207   K 4.4 10/07/2017 1436   CL 106 08/06/2021 1207   CO2 26 08/06/2021 1207   CO2 26 10/07/2017 1436   BUN 16 08/06/2021 1207   BUN 14.9 10/07/2017 1436   CREATININE 0.73 08/06/2021 1207   CREATININE 1.0 10/07/2017 1436      Component Value Date/Time   CALCIUM 9.3 08/06/2021 1207   CALCIUM 9.5 10/07/2017 1436   ALKPHOS 57 08/06/2021 1207   ALKPHOS 81 10/07/2017 1436   AST 20 08/06/2021 1207   AST 15 10/07/2017 1436   ALT 14 08/06/2021 1207   ALT 7 10/07/2017 1436   BILITOT 0.9 08/06/2021 1207   BILITOT 0.54 10/07/2017 1436

## 2021-08-29 NOTE — Assessment & Plan Note (Signed)
With recent aggressive management of her hypertension, her blood pressure is now looking good I plan to reduce bevacizumab to 5 mg/kg We will call her next week for further follow-up She is instructed to continue to monitor her blood pressure closely

## 2021-08-29 NOTE — Assessment & Plan Note (Signed)
She tolerated previous treatment poorly complicated by pancytopenia and uncontrolled hypertension There has been some delay with her recent treatment Her blood counts are satisfactory and her blood pressure is well controlled now We will resume treatment at reduced dose of bevacizumab 5 mg/kg dose I plan to space out her treatment to every 4 weeks and she is in agreement She is advised to continue to monitor her blood pressure closely

## 2021-08-29 NOTE — Assessment & Plan Note (Signed)
This is due to her recent treatment She is not symptomatic She does not need transfusion support Observe closely I suspect her worsening thrombocytopenia is due to uncontrolled hypertension

## 2021-08-29 NOTE — Progress Notes (Signed)
Per Dr. Alvy Bimler, no treatment today urine protein 300. Pt. instructed to continue to monitor her blood pressure.

## 2021-08-30 ENCOUNTER — Ambulatory Visit: Payer: 59 | Admitting: Registered Nurse

## 2021-08-30 LAB — CA 125: Cancer Antigen (CA) 125: 1480 U/mL — ABNORMAL HIGH (ref 0.0–38.1)

## 2021-09-05 ENCOUNTER — Telehealth: Payer: Self-pay

## 2021-09-05 ENCOUNTER — Other Ambulatory Visit: Payer: Self-pay

## 2021-09-05 MED ORDER — LOSARTAN POTASSIUM 100 MG PO TABS: 100.0000 mg | ORAL_TABLET | Freq: Every day | ORAL | 3 refills | Status: DC

## 2021-09-05 NOTE — Telephone Encounter (Signed)
Called and given below message. She verbalized understanding. Rx sent to her preferred pharmacy. Ask her to call the office back if needed.

## 2021-09-05 NOTE — Telephone Encounter (Signed)
I suggest increase cozaar to 100 mg Please make changes to her med and send new prescription to her local pharmacy

## 2021-09-05 NOTE — Telephone Encounter (Signed)
Called and left message asking her to call the office back with BP readings.

## 2021-09-05 NOTE — Telephone Encounter (Signed)
Called to get blood pressure readings at home. She is checking bp daily at around 6 pm. 10/7 bp 120/98, HR 71 10/8 bp 111/102, HR 69 10/9 bp 102/97, HR 77 10/10 bp 109/99, HR 70 10/11 bp 115/96, HR 68 10/12 bp 125/101, HR 63 10/13 She has not checked bp today

## 2021-09-05 NOTE — Telephone Encounter (Signed)
-----   Message from Heath Lark, MD sent at 09/05/2021  8:41 AM EDT ----- Can you call her to get her BP readings?

## 2021-09-05 NOTE — Telephone Encounter (Signed)
Called and left a message asking her to call the office back. 

## 2021-09-12 ENCOUNTER — Other Ambulatory Visit: Payer: Self-pay | Admitting: Hematology and Oncology

## 2021-09-12 ENCOUNTER — Telehealth: Payer: Self-pay

## 2021-09-12 MED ORDER — HYDROCODONE-ACETAMINOPHEN 5-325 MG PO TABS: 1.0000 | ORAL_TABLET | Freq: Four times a day (QID) | ORAL | 0 refills | Status: DC | PRN

## 2021-09-12 NOTE — Telephone Encounter (Signed)
Refill sent No change to BP meds

## 2021-09-12 NOTE — Telephone Encounter (Signed)
-----   Message from Heath Lark, MD sent at 09/12/2021 11:05 AM EDT ----- Pls call for BP number

## 2021-09-12 NOTE — Telephone Encounter (Signed)
She called back. She is doing okay with some left sided pain in the groin area at times. With constipation the pain is worse. She takes a stool softener and will get relief.  10/15 pm bp 129/82 HR 70 10/16 pm bp 114/78 HR 65 10/17 pm bp 158/90 HR 67 10/18 pm bp 164/94 HR 76 10/19 pm bp 145/98 HR 74 10/20 today bp 133/84  HR 64  She is requesting Hydrocodone refill to [facility].

## 2021-09-12 NOTE — Telephone Encounter (Signed)
Called and given below message. She verbalized understanding. 

## 2021-09-12 NOTE — Telephone Encounter (Signed)
Called and left a message asking her to call the office back. 

## 2021-09-27 ENCOUNTER — Other Ambulatory Visit: Payer: Self-pay

## 2021-09-27 ENCOUNTER — Inpatient Hospital Stay (HOSPITAL_BASED_OUTPATIENT_CLINIC_OR_DEPARTMENT_OTHER): Payer: 59 | Admitting: Hematology and Oncology

## 2021-09-27 ENCOUNTER — Inpatient Hospital Stay: Payer: 59

## 2021-09-27 ENCOUNTER — Inpatient Hospital Stay: Payer: 59 | Attending: Hematology and Oncology

## 2021-09-27 ENCOUNTER — Encounter: Payer: Self-pay | Admitting: Hematology and Oncology

## 2021-09-27 VITALS — BP 148/88 | HR 77 | Resp 18 | Ht 67.0 in | Wt 140.0 lb

## 2021-09-27 DIAGNOSIS — Z9221 Personal history of antineoplastic chemotherapy: Secondary | ICD-10-CM | POA: Insufficient documentation

## 2021-09-27 DIAGNOSIS — D61818 Other pancytopenia: Secondary | ICD-10-CM

## 2021-09-27 DIAGNOSIS — C786 Secondary malignant neoplasm of retroperitoneum and peritoneum: Secondary | ICD-10-CM | POA: Insufficient documentation

## 2021-09-27 DIAGNOSIS — Z90722 Acquired absence of ovaries, bilateral: Secondary | ICD-10-CM | POA: Diagnosis not present

## 2021-09-27 DIAGNOSIS — Z90710 Acquired absence of both cervix and uterus: Secondary | ICD-10-CM | POA: Diagnosis not present

## 2021-09-27 DIAGNOSIS — Z79899 Other long term (current) drug therapy: Secondary | ICD-10-CM | POA: Diagnosis not present

## 2021-09-27 DIAGNOSIS — G893 Neoplasm related pain (acute) (chronic): Secondary | ICD-10-CM

## 2021-09-27 DIAGNOSIS — Z5112 Encounter for antineoplastic immunotherapy: Secondary | ICD-10-CM | POA: Diagnosis present

## 2021-09-27 DIAGNOSIS — C562 Malignant neoplasm of left ovary: Secondary | ICD-10-CM | POA: Diagnosis present

## 2021-09-27 DIAGNOSIS — Z9079 Acquired absence of other genital organ(s): Secondary | ICD-10-CM | POA: Diagnosis not present

## 2021-09-27 DIAGNOSIS — I10 Essential (primary) hypertension: Secondary | ICD-10-CM | POA: Insufficient documentation

## 2021-09-27 DIAGNOSIS — Z85038 Personal history of other malignant neoplasm of large intestine: Secondary | ICD-10-CM | POA: Diagnosis not present

## 2021-09-27 LAB — CMP (CANCER CENTER ONLY)
ALT: 10 U/L (ref 0–44)
AST: 17 U/L (ref 15–41)
Albumin: 3.8 g/dL (ref 3.5–5.0)
Alkaline Phosphatase: 55 U/L (ref 38–126)
Anion gap: 7 (ref 5–15)
BUN: 17 mg/dL (ref 6–20)
CO2: 26 mmol/L (ref 22–32)
Calcium: 8.8 mg/dL — ABNORMAL LOW (ref 8.9–10.3)
Chloride: 107 mmol/L (ref 98–111)
Creatinine: 0.78 mg/dL (ref 0.44–1.00)
GFR, Estimated: >60 mL/min (ref >60)
Glucose, Bld: 89 mg/dL (ref 70–99)
Potassium: 4.3 mmol/L (ref 3.5–5.1)
Sodium: 140 mmol/L (ref 135–145)
Total Bilirubin: 0.4 mg/dL (ref 0.3–1.2)
Total Protein: 6.5 g/dL (ref 6.5–8.1)

## 2021-09-27 LAB — TOTAL PROTEIN, URINE DIPSTICK: Protein, ur: 100 mg/dL — AB

## 2021-09-27 LAB — CBC WITH DIFFERENTIAL (CANCER CENTER ONLY)
Abs Immature Granulocytes: 0.01 10*3/uL (ref 0.00–0.07)
Basophils Absolute: 0 10*3/uL (ref 0.0–0.1)
Basophils Relative: 1 %
Eosinophils Absolute: 0 10*3/uL (ref 0.0–0.5)
Eosinophils Relative: 1 %
HCT: 34.3 % — ABNORMAL LOW (ref 36.0–46.0)
Hemoglobin: 11.9 g/dL — ABNORMAL LOW (ref 12.0–15.0)
Immature Granulocytes: 0 %
Lymphocytes Relative: 49 %
Lymphs Abs: 2.8 10*3/uL (ref 0.7–4.0)
MCH: 35.3 pg — ABNORMAL HIGH (ref 26.0–34.0)
MCHC: 34.7 g/dL (ref 30.0–36.0)
MCV: 101.8 fL — ABNORMAL HIGH (ref 80.0–100.0)
Monocytes Absolute: 0.3 10*3/uL (ref 0.1–1.0)
Monocytes Relative: 5 %
Neutro Abs: 2.5 10*3/uL (ref 1.7–7.7)
Neutrophils Relative %: 44 %
Platelet Count: 100 10*3/uL — ABNORMAL LOW (ref 150–400)
RBC: 3.37 MIL/uL — ABNORMAL LOW (ref 3.87–5.11)
RDW: 12.8 % (ref 11.5–15.5)
WBC Count: 5.7 10*3/uL (ref 4.0–10.5)
nRBC: 0 % (ref 0.0–0.2)

## 2021-09-27 MED ORDER — SODIUM CHLORIDE 0.9 % IV SOLN
5.0000 mg/kg | Freq: Once | INTRAVENOUS | Status: AC
  Administered 2021-09-27: 300 mg via INTRAVENOUS
  Filled 2021-09-27: qty 12

## 2021-09-27 MED ORDER — SODIUM CHLORIDE 0.9% FLUSH
10.0000 mL | INTRAVENOUS | Status: DC | PRN
  Administered 2021-09-27: 10 mL

## 2021-09-27 MED ORDER — SODIUM CHLORIDE 0.9% FLUSH
10.0000 mL | Freq: Once | INTRAVENOUS | Status: AC
  Administered 2021-09-27: 10 mL

## 2021-09-27 MED ORDER — SODIUM CHLORIDE 0.9 % IV SOLN: Freq: Once | INTRAVENOUS | Status: AC

## 2021-09-27 MED ORDER — HEPARIN SOD (PORK) LOCK FLUSH 100 UNIT/ML IV SOLN
500.0000 [IU] | Freq: Once | INTRAVENOUS | Status: AC | PRN
  Administered 2021-09-27: 500 [IU]

## 2021-09-27 NOTE — Assessment & Plan Note (Signed)
This is due to her recent treatment She is not symptomatic She does not need transfusion support Observe closely

## 2021-09-27 NOTE — Assessment & Plan Note (Signed)
She has multifactorial pain Her pain is well controlled She will continue prescribed pain medicine and Lyrica

## 2021-09-27 NOTE — Assessment & Plan Note (Signed)
With recent aggressive management of her hypertension, her blood pressure is now looking good I plan to reduce bevacizumab to 5 mg/kg We will call her next week for further follow-up She is instructed to continue to monitor her blood pressure closely

## 2021-09-27 NOTE — Progress Notes (Signed)
[facility] OFFICE PROGRESS NOTE  Patient Care Team: Marrian Salvage, [place] as PCP - General (Internal Medicine) Judeth Horn, MD as Consulting Physician (General Surgery) Nat Math, MD as Referring Physician (Obstetrics and Gynecology) Elam Dutch, MD as Consulting Physician (Vascular Surgery) Heath Lark, MD as Consulting Physician (Hematology and Oncology)  ASSESSMENT & PLAN:  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 [facility]) She tolerated previous treatment poorly complicated by pancytopenia and uncontrolled hypertension There has been some delay with her recent treatment Her blood counts are satisfactory and her blood pressure is well controlled now We will resume treatment at reduced dose of bevacizumab 5 mg/kg dose I plan to space out her treatment to every 4 weeks and she is in agreement She is advised to continue to monitor her blood pressure closely I plan to repeat CT imaging before her next visit  Essential hypertension With recent aggressive management of her hypertension, her blood pressure is now looking good I plan to reduce bevacizumab to 5 mg/kg We will call her next week for further follow-up She is instructed to continue to monitor her blood pressure closely  Pancytopenia, acquired ([place]) This is due to her recent treatment She is not symptomatic She does not need transfusion support Observe closely  Cancer associated pain She has multifactorial pain Her pain is well controlled She will continue prescribed pain medicine and Lyrica  Orders Placed This Encounter  Procedures   CT ABDOMEN PELVIS W CONTRAST    Standing Status:   Future    Standing Expiration Date:   09/27/2022    Order Specific Question:   If indicated for the ordered procedure, I authorize the administration of contrast media per Radiology protocol    Answer:   Yes    Order Specific Question:   Preferred imaging location?    Answer:   [facility]    Order Specific  Question:   Radiology Contrast Protocol - do NOT remove file path    Answer:   [email or web address]    Order Specific Question:   Is patient pregnant?    Answer:   No    All questions were answered. The patient knows to call the clinic with any problems, questions or concerns. The total time spent in the appointment was 20 minutes encounter with patients including review of chart and various tests results, discussions about plan of care and coordination of care plan   Heath Lark, MD 09/27/2021 1:59 PM  INTERVAL HISTORY: Please see below for problem oriented charting. she returns for treatment follow-up on maintenance bevacizumab for recurrent ovarian cancer She is doing well Blood pressure is better controlled Denies abdominal pain or changes in bowel habits No recent infection The chronic leg pain is stable  REVIEW OF SYSTEMS:   Constitutional: Denies fevers, chills or abnormal weight loss Eyes: Denies blurriness of vision Ears, nose, mouth, throat, and face: Denies mucositis or sore throat Respiratory: Denies cough, dyspnea or wheezes Cardiovascular: Denies palpitation, chest discomfort or lower extremity swelling Gastrointestinal:  Denies nausea, heartburn or change in bowel habits Skin: Denies abnormal skin rashes Lymphatics: Denies new lymphadenopathy or easy bruising Neurological:Denies numbness, tingling or new weaknesses Behavioral/Psych: Mood is stable, no new changes  All other systems were reviewed with the patient and are negative.  I have reviewed the past medical history, past surgical history, social history and family history with the patient and they are unchanged from previous note.  ALLERGIES:  is allergic to other, demerol [meperidine], morphine and related, oxycodone,  penicillins, strawberry flavor, tylenol [acetaminophen], and ibuprofen.  MEDICATIONS:  Current Outpatient Medications  Medication Sig Dispense Refill    acetaminophen (TYLENOL) 325 MG tablet Take 325 mg by mouth as needed.     amLODipine (NORVASC) 10 MG tablet Take 1 tablet (10 mg total) by mouth daily. 30 tablet 3   atenolol (TENORMIN) 50 MG tablet Take 1 tablet (50 mg total) by mouth daily. 30 tablet 3   docusate sodium 100 MG CAPS Take 100 mg by mouth 2 (two) times daily as needed for mild constipation. 10 capsule 0   doxazosin (CARDURA) 2 MG tablet Take 1 tablet (2 mg total) by mouth daily. 30 tablet 3   HYDROcodone-acetaminophen (NORCO/VICODIN) 5-325 MG tablet Take 1 tablet by mouth every 6 (six) hours as needed for moderate pain. 90 tablet 0   lidocaine (LIDODERM) 5 % PLACE 1 PATCH ONTO THE SKIN EVERY DAY REMOVE AND DISCARD WITHIN 12 HOURS OR AS DIRECTED BY PRESCRIBER 90 patch 1   lidocaine-prilocaine (EMLA) cream Apply to affected area once 30 g 3   losartan (COZAAR) 100 MG tablet Take 1 tablet (100 mg total) by mouth daily. 30 tablet 3   ondansetron (ZOFRAN) 8 MG tablet TAKE 1 TABLET BY MOUTH EVERY 8 HOURS AS NEEDED. START ON THE 3RD DAY AFTER CHEMOTHERAPY 30 tablet 4   pregabalin (LYRICA) 200 MG capsule Take 1 capsule (200 mg total) by mouth every 8 (eight) hours. Please give patient brand name Lyrica 90 capsule 4   prochlorperazine (COMPAZINE) 10 MG tablet Take 1 tablet (10 mg total) by mouth every 6 (six) hours as needed (Nausea or vomiting). 30 tablet 1   No current facility-administered medications for this visit.   Facility-Administered Medications Ordered in Other Visits  Medication Dose Route Frequency Provider Last Rate Last Admin   bevacizumab-awwb (MVASI) 300 mg in sodium chloride 0.9 % 100 mL chemo infusion  5 mg/kg (Treatment Plan Recorded) Intravenous Once Heath Lark, MD 336 mL/hr at 09/27/21 1344 300 mg at 09/27/21 1344   heparin lock flush 100 unit/mL  500 Units Intracatheter Once PRN Alvy Bimler, Ollis Daudelin, MD       sodium chloride flush (NS) 0.9 % injection 10 mL  10 mL Intracatheter PRN Heath Lark, MD        SUMMARY OF  ONCOLOGIC HISTORY: Oncology History Overview Note  BRCA1 positive   Cancer of right colon ([place])  09/16/2014 Imaging   CT abd/pel:  a 3.8cm cecal mass with associated ileocecal intussusception, and a 9.1cm solid and cystic mass in the left pelvis,   09/18/2014 Surgery   right hemicolectomy and terminal ileoectomy   09/18/2014 Pathologic Stage   pT3pN1Mx, tumor extends into pericolonic soft tissue and is less than 22m from the serosal surface, LVI 8-), PNI (-), all 23 node negative, one soft tissue tumor deposit, surgical margins negative.    09/18/2014 Initial Diagnosis   Adenocarcinoma of right colon   08/06/2016 Imaging   CT chest/abd/pelvis with contrast IMPRESSION: 1. No recurrent malignancy identified. 2. 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 On the prior exam from 09/13/2015 there was back flow of contrast in the left external iliac artery to supply the left internal iliac artery ; this back flow of contrast is no longer present in the left external iliac artery is occluded. 3. Several pulmonary nodules at or below 4 mm diameter, unchanged. 4. 3 mm hypodensity posteriorly in the body of the pancreas, not well  seen previously and subtle today, likely incidental, but merits observation. 5. 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 Prominent degenerative spurring of both acetabula. 6. Lumbar spondylosis and degenerative disc disease causing prominent impingement at L4-5 and mild impingement at L5-S1.   03/08/2017 Imaging   CT CAP W Contrast 03/08/17 IMPRESSION: Chest Impression: 1. Stable small pulmonary nodules within LEFT lung. 2. No mediastinal lymphadenopathy.  Abdomen / Pelvis Impression: 1. No evidence of metastatic colorectal cancer or ovarian cancer within the abdomen pelvis. 2. No lymphadenopathy. 3. Postsurgical change consistent RIGHT hemicolectomy and hysterectomy 4. A fem-fem  arterial bypass noted.   11/13/2017 Imaging   CT AP w contrast IMPRESSION: 1. No findings of recurrent malignancy in the abdomen or pelvis. 2. Other imaging findings of potential clinical significance:Chronic AVN of both femoral heads without contour abnormality. Lumbar impingement at L4-5 and L5-S1. Aortic Atherosclerosis (ICD10-I70.0). 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 Partial right hemicolectomy. Hysterectomy.   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 ([place])  10/09/2014 Imaging   CT of the abdomen and pelvis showed a 9.1 cm solid and cystic mass in the left pelvis concerning for malignancy. This was discovered during her workup for colon cancer.   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 Tumor size 3.7 cm, no lymph nodes removed. T1cNx. Tumor cells are positive for cytokeratin 7 and estrogen receptor.   11/01/2014 Surgery   B/l Salpingo-oophorectomy.   11/01/2014 Initial Diagnosis   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   11/28/2014 Cancer Staging   Staging form: Ovary, AJCC 7th Edition - Clinical: Stage III (rT3, N0, M0) - Signed by Heath Lark, MD on 06/09/2019    03/08/2017 Imaging   CT CAP W Contrast 03/08/17 IMPRESSION: Chest Impression:  1. Stable small pulmonary nodules within LEFT lung. 2. No mediastinal lymphadenopathy.  Abdomen / Pelvis Impression:  1. No evidence of metastatic colorectal cancer or ovarian cancer within the abdomen pelvis. 2. No lymphadenopathy. 3. Postsurgical change consistent RIGHT hemicolectomy and hysterectomy 4. A fem-fem arterial bypass noted.   03/12/2017 Mammogram   MM Right Breast 03/12/17 IMPRESSION: No mammographic or sonographic evidence of malignancy. No correlate identified for the abnormal enhancement seen within the right nipple on MRI of 02/27/2017.    04/02/2017  Pathology Results   Surgical Pathology of Nipple Biopsy: 04/02/17 Diagnosis Nipple Biopsy, Right - BENIGN NIPPLE TISSUE. - NO ATYPIA OR MALIGNANCY.    11/13/2017 Imaging   1. No findings of recurrent malignancy in the abdomen or pelvis. 2. Other imaging findings of potential clinical significance: Chronic AVN of both femoral heads without contour abnormality. Lumbar impingement at L4-5 and L5-S1. Aortic Atherosclerosis (ICD10-I70.0). 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 Partial right hemicolectomy. Hysterectomy.   03/22/2018 Mammogram   Screening mammogram IMPRESSION No evidence of malignancy.     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   06/06/2019 Imaging   1. Tiny bilateral pulmonary nodules stable since 11/12/2018. Continued close attention on follow-up recommended. 2. Incompletely visualized upper abdominal lymphadenopathy better characterized on the abdomen CT from 2 weeks ago. 3.  Aortic Atherosclerois (ICD10-170.0) 4.  Emphysema. (HYQ65-H84.9)   06/07/2019 Procedure   Technically successful CT guided core needle biopsy of indeterminate peritoneal nodule within the right lower abdomen/pelvis.   06/07/2019 Pathology Results   Soft Tissue Needle Core Biopsy, peritoneal nodule within right lower abdomen - METASTATIC CARCINOMA CONSISTENT WITH PATIENT'S CLINICAL HISTORY OF PRIMARY OVARIAN CARCINOMA. SEE NOTE Diagnosis Note Immunohistochemical stains show that the tumor cells are positive for CK7, ER and PAX 8; and negative for CK20 and CDX2. This immunostaining profile is consistent with above diagnosis.   06/16/2019 Tumor Marker   Patient's tumor was tested for the following markers:  CA-125 Results of the tumor marker test revealed 499   06/20/2019 - 11/14/2019 Chemotherapy   The patient had carboplatin and taxol   07/11/2019 Tumor Marker   Patient's tumor was tested for the following markers: CA-125 Results of the tumor marker test revealed 438   08/05/2019 Tumor Marker   Patient's tumor was tested for the following markers: CA-125 Results of the tumor marker test revealed 272.   08/25/2019 Imaging   Ct chest, abdomen and pelvis 1. Slight interval decrease in size of a right lower quadrant peritoneal nodule measuring 1.7 x 1.6 cm, previously 2.2 x 2.2 cm (series 2, image 94, series 5, image 58). Findings are consistent with treatment response.   2. No significant change in ill-defined soft tissue about the sigmoid colon in the hysterectomy bed (series 2, image 101).   3.  Status post hysterectomy.   4. No evidence of metastatic disease in the chest. Stable benign small pulmonary nodules.   5.  Coronary artery disease.  Aortic Atherosclerosis (ICD10-I70.0).     08/26/2019 Tumor Marker   Patient's tumor was tested for the following markers: CA-125 Results of the tumor marker test revealed 189.   09/26/2019 Tumor Marker   Patient's tumor was tested for the following markers: CA-125 Results of the tumor marker test revealed 137   10/03/2019 Tumor Marker   Patient's tumor was tested for the following markers: CA-125 Results of the tumor marker test revealed 137.   10/31/2019 Tumor Marker   Patient's tumor was tested for the following markers: CA-125 Results of the tumor marker test revealed 101   11/29/2019 Imaging   1. Right lower quadrant peritoneal implant is decreased. Stable irregular nodular pelvic peritoneal thickening superior to the vaginal cuff. 2. No new or progressive metastatic disease. No abdominopelvic lymphadenopathy. 3.  Aortic Atherosclerosis (ICD10-I70.0).   12/15/2019 -  Chemotherapy   The patient had Lynparza for chemotherapy treatment.      12/23/2019 Tumor Marker   Patient's tumor was tested for the following markers: CA-125 Results of the tumor marker test revealed 55.9   01/02/2020 Tumor Marker   Patient's tumor was tested for the following markers: CA-125. Results of the tumor marker test revealed 58.6   02/03/2020 Tumor Marker   Patient's tumor was tested for the following markers: CA-125 Results of the tumor marker test revealed 52.2   02/24/2020 Tumor Marker   Patient's tumor was tested for the following markers: CA-125 Results of the tumor marker test revealed 64.8   04/19/2020 Imaging   1. Continued slight interval decrease in size of a soft tissue nodule in the right paracolic gutter, consistent with treatment response. 2. Soft tissue in the hysterectomy bed near the distal sigmoid colon and rectum may be slightly decreased in size, difficult to measure due to configuration but apparently diminished in size, also consistent with treatment response. 3. Status  post hysterectomy and oophorectomy as well as right colon resection. 4. No evidence of new metastatic disease in the abdomen or pelvis. 5. Redemonstrated long segment occlusion of the left common, internal, and external iliac arteries. Status post femoral-femoral bypass grafting with reconstitution of the left common femoral artery. Aortic Atherosclerosis (ICD10-I70.0).   08/15/2020 Tumor Marker   Patient's tumor was tested for the following markers: CA-125 Results of the tumor marker test revealed 47.1   10/22/2020 Imaging   1. Reduced size of the nodule along the right paracolic gutter, currently 0.6 by 1.2 cm, previously 0.7 by 1.4 cm on 04/19/2020 and previously 0.9 by 1.5 cm on 11/29/2019. Appearance compatible with treatment response. 2. Further mild reduction in size of the bandlike density along the rectosigmoid mesentery, probably from scarring or effectively treated tumor. 3. Prominent stool throughout the colon favors constipation.  4. Stable chronic  occlusion of the left iliac arteries, with a patent fem-fem bypass. 5. 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 6. Lower lumbar spondylosis and degenerative disc disease causing impingement at L4-5 and L5-S1. 7. Aortic atherosclerosis.     10/23/2020 Tumor Marker   Patient's tumor was tested for the following markers: CA-125 Results of the tumor marker test revealed 74.6   01/21/2021 Tumor Marker   Patient's tumor was tested for the following markers: CA-125 Results of the tumor marker test revealed 825   01/28/2021 Imaging   1. Changes of right hemicolectomy with slightly decreased size of the nodule along the right pericolic gutter and similar band like density along the rectosigmoid mesentery. 2. Two new well-circumscribed low-density nodules in the presacral perirectal space, difficult to ascertain whether they arise from the bowel wall or arise from within the peritoneum. This is a nonspecific finding which could be further evaluated with colonoscopy, repeat CT with rectal contrast or attention on close interval follow-up imaging in 3 months. 3. 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 4. 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 5. Emphysema and aortic atherosclerosis. Aortic Atherosclerosis (ICD10-I70.0) and Emphysema (ICD10-J43.9).   02/08/2021 Procedure   Successful placement of a right internal jugular approach power injectable Port-A-Cath. The catheter is ready for immediate use.     02/11/2021 - 06/06/2021 Chemotherapy          02/18/2021 Tumor Marker   Patient's tumor was tested for the following markers: CA-125 Results of the tumor marker test revealed 852   03/04/2021 Tumor Marker   Patient's tumor was tested for the following markers: CA-125. Results of the tumor marker test revealed 541   03/11/2021 Tumor Marker   Patient's tumor  was tested for the following markers: CA-125 Results of the tumor marker test revealed 568   03/25/2021 Tumor Marker   Patient's tumor was tested for the following markers:CA-125 Results of the tumor marker test revealed 367.   04/08/2021 Tumor Marker   Patient's tumor was tested for the following markers: CA-125 Results of the tumor marker test revealed 273   04/29/2021 Tumor Marker   Patient's tumor was tested for the following markers: CA-125 Results of the tumor marker test revealed 431   06/06/2021 Tumor Marker   Patient's tumor was tested for the following markers: CA-125. Results of the tumor marker test revealed 594.   06/18/2021 Imaging   1. Nodular fascial thickening with subtle increase along the RIGHT pericolic gutter as described. Short interval follow-up  may be helpful. 2. Stable appearance of fascial thickening and serosal thickening in the pelvis, potentially indicative of peritoneal disease. 3. Post RIGHT hemicolectomy. 4. Stable chronic occlusion of LEFT iliac vasculature with patent femoral femoral bypass grafting. 5. 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 6. Aortic atherosclerosis.   Aortic Atherosclerosis (ICD10-I70.0).     07/16/2021 -  Chemotherapy   Patient is on Treatment Plan : Ovarian Bevacizumab q21d     07/17/2021 Tumor Marker   Patient's tumor was tested for the following markers: CA-125. Results of the tumor marker test revealed 996.   08/06/2021 Tumor Marker   Patient's tumor was tested for the following markers: CA-125. Results of the tumor marker test revealed 975.     PHYSICAL EXAMINATION: ECOG PERFORMANCE STATUS: 1 - Symptomatic but completely ambulatory  Vitals:   09/27/21 1254  BP: (!) 148/88  Pulse: 77  Resp: 18  SpO2: 100%   Filed Weights   09/27/21 1254  Weight: 140 lb (63.5 kg)    GENERAL:alert, no distress and comfortable SKIN: skin color, texture, turgor are normal, no rashes or significant  lesions EYES: normal, Conjunctiva are pink and non-injected, sclera clear OROPHARYNX:no exudate, no erythema and lips, buccal mucosa, and tongue normal  NECK: supple, thyroid normal size, non-tender, without nodularity LYMPH:  no palpable lymphadenopathy in the cervical, axillary or inguinal LUNGS: clear to auscultation and percussion with normal breathing effort HEART: regular rate & rhythm and no murmurs and no lower extremity edema ABDOMEN:abdomen soft, non-tender and normal bowel sounds Musculoskeletal:no cyanosis of digits and no clubbing  NEURO: alert & oriented x 3 with fluent speech, no focal motor/sensory deficits  LABORATORY DATA:  I have reviewed the data as listed    Component Value Date/Time   NA 140 09/27/2021 1216   NA 141 10/07/2017 1436   K 4.3 09/27/2021 1216   K 4.4 10/07/2017 1436   CL 107 09/27/2021 1216   CO2 26 09/27/2021 1216   CO2 26 10/07/2017 1436   GLUCOSE 89 09/27/2021 1216   GLUCOSE 85 10/07/2017 1436   BUN 17 09/27/2021 1216   BUN 14.9 10/07/2017 1436   CREATININE 0.78 09/27/2021 1216   CREATININE 1.0 10/07/2017 1436   CALCIUM 8.8 (L) 09/27/2021 1216   CALCIUM 9.5 10/07/2017 1436   PROT 6.5 09/27/2021 1216   PROT 7.8 10/07/2017 1436   ALBUMIN 3.8 09/27/2021 1216   ALBUMIN 3.9 10/07/2017 1436   AST 17 09/27/2021 1216   AST 15 10/07/2017 1436   ALT 10 09/27/2021 1216   ALT 7 10/07/2017 1436   ALKPHOS 55 09/27/2021 1216   ALKPHOS 81 10/07/2017 1436   BILITOT 0.4 09/27/2021 1216   BILITOT 0.54 10/07/2017 1436   GFRNONAA >60 09/27/2021 1216   GFRAA >60 08/15/2020 1121    No results found for: SPEP, UPEP  Lab Results  Component Value Date   WBC 5.7 09/27/2021   NEUTROABS 2.5 09/27/2021   HGB 11.9 (L) 09/27/2021   HCT 34.3 (L) 09/27/2021   MCV 101.8 (H) 09/27/2021   PLT 100 (L) 09/27/2021      Chemistry      Component Value Date/Time   NA 140 09/27/2021 1216   NA 141 10/07/2017 1436   K 4.3 09/27/2021 1216   K 4.4 10/07/2017 1436    CL 107 09/27/2021 1216   CO2 26 09/27/2021 1216   CO2 26 10/07/2017 1436   BUN 17 09/27/2021 1216   BUN 14.9 10/07/2017 1436   CREATININE  0.78 09/27/2021 1216   CREATININE 1.0 10/07/2017 1436      Component Value Date/Time   CALCIUM 8.8 (L) 09/27/2021 1216   CALCIUM 9.5 10/07/2017 1436   ALKPHOS 55 09/27/2021 1216   ALKPHOS 81 10/07/2017 1436   AST 17 09/27/2021 1216   AST 15 10/07/2017 1436   ALT 10 09/27/2021 1216   ALT 7 10/07/2017 1436   BILITOT 0.4 09/27/2021 1216   BILITOT 0.54 10/07/2017 1436

## 2021-09-27 NOTE — Progress Notes (Signed)
Urine protein positive 100.  Per Dr. Alvy Bimler, ok to proceed with treatment.

## 2021-09-27 NOTE — Patient Instructions (Signed)
[facility]   °Discharge Instructions: °Thank you for choosing [facility] to provide your oncology and hematology care.  ° °If you have a lab appointment with the Cancer Center, please go directly to the Cancer Center and check in at the registration area. °  °Wear comfortable clothing and clothing appropriate for easy access to any Portacath or PICC line.  ° °We strive to give you quality time with your provider. You may need to reschedule your appointment if you arrive late (15 or more minutes).  Arriving late affects you and other patients whose appointments are after yours.  Also, if you miss three or more appointments without notifying the office, you may be dismissed from the clinic at the provider’s discretion.    °  °For prescription refill requests, have your pharmacy contact our office and allow 72 hours for refills to be completed.   ° °Today you received the following chemotherapy and/or immunotherapy agents: avastin  °  °To help prevent nausea and vomiting after your treatment, we encourage you to take your nausea medication as directed. ° °BELOW ARE SYMPTOMS THAT SHOULD BE REPORTED IMMEDIATELY: °*FEVER GREATER THAN 100.4 F (38 °C) OR HIGHER °*CHILLS OR SWEATING °*NAUSEA AND VOMITING THAT IS NOT CONTROLLED WITH YOUR NAUSEA MEDICATION °*UNUSUAL SHORTNESS OF BREATH °*UNUSUAL BRUISING OR BLEEDING °*URINARY PROBLEMS (pain or burning when urinating, or frequent urination) °*BOWEL PROBLEMS (unusual diarrhea, constipation, pain near the anus) °TENDERNESS IN MOUTH AND THROAT WITH OR WITHOUT PRESENCE OF ULCERS (sore throat, sores in mouth, or a toothache) °UNUSUAL RASH, SWELLING OR PAIN  °UNUSUAL VAGINAL DISCHARGE OR ITCHING  ° °Items with * indicate a potential emergency and should be followed up as soon as possible or go to the Emergency Department if any problems should occur. ° °Please show the CHEMOTHERAPY ALERT CARD or IMMUNOTHERAPY ALERT CARD at check-in to  the Emergency Department and triage nurse. ° °Should you have questions after your visit or need to cancel or reschedule your appointment, please contact [facility]  Dept: [phone]  and follow the prompts.  Office hours are 8:00 a.m. to 4:30 p.m. Monday - Friday. Please note that voicemails left after 4:00 p.m. may not be returned until the following business day.  We are closed weekends and major holidays. You have access to a nurse at all times for urgent questions. Please call the main number to the clinic Dept: [phone] and follow the prompts. ° ° °For any non-urgent questions, you may also contact your provider using MyChart. We now offer e-Visits for anyone 18 and older to request care online for non-urgent symptoms. For details visit [email or web address]. °  °Also download the MyChart app! Go to the app store, search "MyChart", open the app, select , and log in with your MyChart username and password. ° °Due to Covid, a mask is required upon entering the hospital/clinic. If you do not have a mask, one will be given to you upon arrival. For doctor visits, patients may have 1 support person aged 18 or older with them. For treatment visits, patients cannot have anyone with them due to current Covid guidelines and our immunocompromised population.  ° °

## 2021-09-27 NOTE — Assessment & Plan Note (Signed)
She tolerated previous treatment poorly complicated by pancytopenia and uncontrolled hypertension There has been some delay with her recent treatment Her blood counts are satisfactory and her blood pressure is well controlled now We will resume treatment at reduced dose of bevacizumab 5 mg/kg dose I plan to space out her treatment to every 4 weeks and she is in agreement She is advised to continue to monitor her blood pressure closely I plan to repeat CT imaging before her next visit

## 2021-10-04 ENCOUNTER — Telehealth: Payer: Self-pay

## 2021-10-04 NOTE — Telephone Encounter (Signed)
Attempted to call patient on primary number to check-in on patient. No answer. Left voicemail providing patient with phone number to callback.

## 2021-10-04 NOTE — Telephone Encounter (Signed)
Patient returned called request for callback to check in on patient's overall blood pressure trends. Patient states that the first few days her blood pressure was elevated, but it has now since been trending lower. Her last BP reading was 108/75 (66). She continues to take her blood pressure every day and is taking her blood pressure medications as advised. She does note some increasing leg weakness, but denies any shortness of breath or lightheadedness. She feels at this time that it is manageable. She reports exercising every other day on her treadmill for about 2 minutes and continues to drink plenty of fluids.  Patient did not have any further questions or concerns and was reminded to reach out if she did.  Also, reminded patient of situations in which she was to seek emergent help. She verbalized an understanding of this education.

## 2021-10-10 ENCOUNTER — Telehealth: Payer: Self-pay

## 2021-10-10 ENCOUNTER — Other Ambulatory Visit: Payer: Self-pay | Admitting: Hematology and Oncology

## 2021-10-10 MED ORDER — HYDROCODONE-ACETAMINOPHEN 5-325 MG PO TABS: 1.0000 | ORAL_TABLET | Freq: Four times a day (QID) | ORAL | 0 refills | Status: DC | PRN

## 2021-10-10 NOTE — Telephone Encounter (Signed)
Pt called and requested refill for Norco 5-325. Request given to Dr Lindi Adie. Pt is aware and verbalized thanks.

## 2021-10-28 ENCOUNTER — Other Ambulatory Visit: Payer: Self-pay

## 2021-10-28 ENCOUNTER — Ambulatory Visit (HOSPITAL_COMMUNITY)
Admission: RE | Admit: 2021-10-28 | Discharge: 2021-10-28 | Disposition: A | Discharge status: Home / Self Care | Payer: 59 | Source: Ambulatory Visit | Attending: Hematology and Oncology | Admitting: Hematology and Oncology

## 2021-10-28 ENCOUNTER — Encounter: Payer: Self-pay | Admitting: Hematology and Oncology

## 2021-10-28 DIAGNOSIS — C562 Malignant neoplasm of left ovary: Secondary | ICD-10-CM | POA: Insufficient documentation

## 2021-10-28 LAB — POCT I-STAT CREATININE: Creatinine, Ser: 0.9 mg/dL (ref 0.44–1.00)

## 2021-10-28 MED ORDER — IOHEXOL 300 MG/ML  SOLN
100.0000 mL | Freq: Once | INTRAMUSCULAR | Status: AC | PRN
  Administered 2021-10-28: 100 mL via INTRAVENOUS

## 2021-10-28 NOTE — Progress Notes (Signed)
Patient called regarding assistance for utility bills.  Patient previously had [employer] and advised this was one-time.  She has my card for any additional financial questions or concerns.

## 2021-10-29 ENCOUNTER — Inpatient Hospital Stay: Payer: 59

## 2021-10-29 ENCOUNTER — Encounter: Payer: Self-pay | Admitting: Hematology and Oncology

## 2021-10-29 ENCOUNTER — Inpatient Hospital Stay: Payer: 59 | Attending: Hematology and Oncology | Admitting: Hematology and Oncology

## 2021-10-29 DIAGNOSIS — Z79899 Other long term (current) drug therapy: Secondary | ICD-10-CM | POA: Insufficient documentation

## 2021-10-29 DIAGNOSIS — Z5111 Encounter for antineoplastic chemotherapy: Secondary | ICD-10-CM | POA: Insufficient documentation

## 2021-10-29 DIAGNOSIS — D61818 Other pancytopenia: Secondary | ICD-10-CM | POA: Insufficient documentation

## 2021-10-29 DIAGNOSIS — Z85038 Personal history of other malignant neoplasm of large intestine: Secondary | ICD-10-CM | POA: Diagnosis not present

## 2021-10-29 DIAGNOSIS — I10 Essential (primary) hypertension: Secondary | ICD-10-CM | POA: Insufficient documentation

## 2021-10-29 DIAGNOSIS — Z9221 Personal history of antineoplastic chemotherapy: Secondary | ICD-10-CM | POA: Insufficient documentation

## 2021-10-29 DIAGNOSIS — Z90710 Acquired absence of both cervix and uterus: Secondary | ICD-10-CM | POA: Insufficient documentation

## 2021-10-29 DIAGNOSIS — C786 Secondary malignant neoplasm of retroperitoneum and peritoneum: Secondary | ICD-10-CM | POA: Diagnosis present

## 2021-10-29 DIAGNOSIS — Z90722 Acquired absence of ovaries, bilateral: Secondary | ICD-10-CM | POA: Insufficient documentation

## 2021-10-29 DIAGNOSIS — Z5112 Encounter for antineoplastic immunotherapy: Secondary | ICD-10-CM | POA: Diagnosis present

## 2021-10-29 DIAGNOSIS — G893 Neoplasm related pain (acute) (chronic): Secondary | ICD-10-CM | POA: Insufficient documentation

## 2021-10-29 DIAGNOSIS — Z9079 Acquired absence of other genital organ(s): Secondary | ICD-10-CM | POA: Diagnosis not present

## 2021-10-29 DIAGNOSIS — C562 Malignant neoplasm of left ovary: Secondary | ICD-10-CM | POA: Insufficient documentation

## 2021-10-29 MED ORDER — HYDROCODONE-ACETAMINOPHEN 10-325 MG PO TABS: 1.0000 | ORAL_TABLET | Freq: Four times a day (QID) | ORAL | 0 refills | Status: DC | PRN

## 2021-10-29 NOTE — Assessment & Plan Note (Signed)
She has worsening pain control due to disease progression I recommend increasing the dose of hydrocodone from 5 mg dose to 10 mg dose I will call her in a few days to assess pain control I did warn her about risk of sedation and constipation

## 2021-10-29 NOTE — Assessment & Plan Note (Signed)
I reviewed CT imaging with the patient Unfortunately, she has disease progression She is more symptomatic with lower back pain, consistent with cancer associated pain Her only option would be to return back on systemic chemotherapy However, she tolerated treatment very poorly in the past with severe pancytopenia The patient is very tearful because she does not want to go back on treatment and she is tired of being on treatment She is undecided at the end of the visit I will call in a few days to check on her and we will increase her pain medication for better pain control Briefly, I reviewed options with her Options would include single agent Doxil or Taxotere

## 2021-10-29 NOTE — Progress Notes (Signed)
[place] OFFICE PROGRESS NOTE  Patient Care Team: Marrian Salvage, [place] as PCP - General (Internal Medicine) Judeth Horn, MD as Consulting Physician (General Surgery) Nat Math, MD as Referring Physician (Obstetrics and Gynecology) Elam Dutch, MD as Consulting Physician (Vascular Surgery) Heath Lark, MD as Consulting Physician (Hematology and Oncology)  ASSESSMENT & PLAN:  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 [facility]) I reviewed CT imaging with the patient Unfortunately, she has disease progression She is more symptomatic with lower back pain, consistent with cancer associated pain Her only option would be to return back on systemic chemotherapy However, she tolerated treatment very poorly in the past with severe pancytopenia The patient is very tearful because she does not want to go back on treatment and she is tired of being on treatment She is undecided at the end of the visit I will call in a few days to check on her and we will increase her pain medication for better pain control Briefly, I reviewed options with her Options would include single agent Doxil or Taxotere  Cancer associated pain She has worsening pain control due to disease progression I recommend increasing the dose of hydrocodone from 5 mg dose to 10 mg dose I will call her in a few days to assess pain control I did warn her about risk of sedation and constipation  No orders of the defined types were placed in this encounter.   All questions were answered. The patient knows to call the clinic with any problems, questions or concerns. The total time spent in the appointment was 40 minutes encounter with patients including review of chart and various tests results, discussions about plan of care and coordination of care plan   Heath Lark, MD 10/29/2021 1:23 PM  INTERVAL HISTORY: Please see below for problem oriented charting. she returns for review of CT imaging results Since last time  I saw her, she has worsening lower back pain Denies recent changes in bowel habits The patient is very tearful when she was told of disease progression and is not able to make decision at the end of the visit  REVIEW OF SYSTEMS:   Constitutional: Denies fevers, chills or abnormal weight loss Eyes: Denies blurriness of vision Ears, nose, mouth, throat, and face: Denies mucositis or sore throat Respiratory: Denies cough, dyspnea or wheezes Cardiovascular: Denies palpitation, chest discomfort or lower extremity swelling Gastrointestinal:  Denies nausea, heartburn or change in bowel habits Skin: Denies abnormal skin rashes Lymphatics: Denies new lymphadenopathy or easy bruising Neurological:Denies numbness, tingling or new weaknesses Behavioral/Psych: Mood is stable, no new changes  All other systems were reviewed with the patient and are negative.  I have reviewed the past medical history, past surgical history, social history and family history with the patient and they are unchanged from previous note.  ALLERGIES:  is allergic to other, demerol [meperidine], morphine and related, oxycodone, penicillins, strawberry flavor, tylenol [acetaminophen], and ibuprofen.  MEDICATIONS:  Current Outpatient Medications  Medication Sig Dispense Refill   HYDROcodone-acetaminophen (NORCO) 10-325 MG tablet Take 1 tablet by mouth every 6 (six) hours as needed. 90 tablet 0   acetaminophen (TYLENOL) 325 MG tablet Take 325 mg by mouth as needed.     amLODipine (NORVASC) 10 MG tablet Take 1 tablet (10 mg total) by mouth daily. 30 tablet 3   atenolol (TENORMIN) 50 MG tablet Take 1 tablet (50 mg total) by mouth daily. 30 tablet 3   docusate sodium 100 MG CAPS Take 100 mg by mouth  2 (two) times daily as needed for mild constipation. 10 capsule 0   doxazosin (CARDURA) 2 MG tablet Take 1 tablet (2 mg total) by mouth daily. 30 tablet 3   lidocaine (LIDODERM) 5 % PLACE 1 PATCH ONTO THE SKIN EVERY DAY REMOVE AND  DISCARD WITHIN 12 HOURS OR AS DIRECTED BY PRESCRIBER 90 patch 1   lidocaine-prilocaine (EMLA) cream Apply to affected area once 30 g 3   losartan (COZAAR) 100 MG tablet Take 1 tablet (100 mg total) by mouth daily. 30 tablet 3   ondansetron (ZOFRAN) 8 MG tablet TAKE 1 TABLET BY MOUTH EVERY 8 HOURS AS NEEDED. START ON THE 3RD DAY AFTER CHEMOTHERAPY 30 tablet 4   pregabalin (LYRICA) 200 MG capsule Take 1 capsule (200 mg total) by mouth every 8 (eight) hours. Please give patient brand name Lyrica 90 capsule 4   prochlorperazine (COMPAZINE) 10 MG tablet Take 1 tablet (10 mg total) by mouth every 6 (six) hours as needed (Nausea or vomiting). 30 tablet 1   No current facility-administered medications for this visit.    SUMMARY OF ONCOLOGIC HISTORY: Oncology History Overview Note  BRCA1 positive Progressed on Olaparib and Avastin   Cancer of right colon ([place])  09/16/2014 Imaging   CT abd/pel:  a 3.8cm cecal mass with associated ileocecal intussusception, and a 9.1cm solid and cystic mass in the left pelvis,   09/18/2014 Surgery   right hemicolectomy and terminal ileoectomy   09/18/2014 Pathologic Stage   pT3pN1Mx, tumor extends into pericolonic soft tissue and is less than 60m from the serosal surface, LVI 8-), PNI (-), all 23 node negative, one soft tissue tumor deposit, surgical margins negative.    09/18/2014 Initial Diagnosis   Adenocarcinoma of right colon   08/06/2016 Imaging   CT chest/abd/pelvis with contrast IMPRESSION: 1. No recurrent malignancy identified. 2. 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 On the prior exam from 09/13/2015 there was back flow of contrast in the left external iliac artery to supply the left internal iliac artery ; this back flow of contrast is no longer present in the left external iliac artery is occluded. 3. Several pulmonary nodules at or below 4 mm diameter, unchanged. 4. 3 mm  hypodensity posteriorly in the body of the pancreas, not well seen previously and subtle today, likely incidental, but merits observation. 5. 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 Prominent degenerative spurring of both acetabula. 6. Lumbar spondylosis and degenerative disc disease causing prominent impingement at L4-5 and mild impingement at L5-S1.   03/08/2017 Imaging   CT CAP W Contrast 03/08/17 IMPRESSION: Chest Impression: 1. Stable small pulmonary nodules within LEFT lung. 2. No mediastinal lymphadenopathy.  Abdomen / Pelvis Impression: 1. No evidence of metastatic colorectal cancer or ovarian cancer within the abdomen pelvis. 2. No lymphadenopathy. 3. Postsurgical change consistent RIGHT hemicolectomy and hysterectomy 4. A fem-fem arterial bypass noted.   11/13/2017 Imaging   CT AP w contrast IMPRESSION: 1. No findings of recurrent malignancy in the abdomen or pelvis. 2. Other imaging findings of potential clinical significance:Chronic AVN of both femoral heads without contour abnormality. Lumbar impingement at L4-5 and L5-S1. Aortic Atherosclerosis (ICD10-I70.0). 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 Partial right hemicolectomy. Hysterectomy.   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  10/09/2014 Imaging   CT of the abdomen and pelvis showed a 9.1 cm solid and cystic  mass in the left pelvis concerning for malignancy. This was discovered during her workup for colon cancer.   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 Tumor size 3.7 cm, no lymph nodes removed. T1cNx. Tumor cells are positive for cytokeratin 7 and estrogen receptor.   11/01/2014 Surgery   B/l Salpingo-oophorectomy.   11/01/2014 Initial Diagnosis   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   11/28/2014 Cancer Staging   Staging form:  Ovary, AJCC 7th Edition - Clinical: Stage III (rT3, N0, M0) - Signed by Heath Lark, MD on 06/09/2019    03/08/2017 Imaging   CT CAP W Contrast 03/08/17 IMPRESSION: Chest Impression:  1. Stable small pulmonary nodules within LEFT lung. 2. No mediastinal lymphadenopathy.  Abdomen / Pelvis Impression:  1. No evidence of metastatic colorectal cancer or ovarian cancer within the abdomen pelvis. 2. No lymphadenopathy. 3. Postsurgical change consistent RIGHT hemicolectomy and hysterectomy 4. A fem-fem arterial bypass noted.   03/12/2017 Mammogram   MM Right Breast 03/12/17 IMPRESSION: No mammographic or sonographic evidence of malignancy. No correlate identified for the abnormal enhancement seen within the right nipple on MRI of 02/27/2017.    04/02/2017 Pathology Results   Surgical Pathology of Nipple Biopsy: 04/02/17 Diagnosis Nipple Biopsy, Right - BENIGN NIPPLE TISSUE. - NO ATYPIA OR MALIGNANCY.    11/13/2017 Imaging   1. No findings of recurrent malignancy in the abdomen or pelvis. 2. Other imaging findings of potential clinical significance: Chronic AVN of both femoral heads without contour abnormality. Lumbar impingement at L4-5 and L5-S1. Aortic Atherosclerosis (ICD10-I70.0). 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 Partial right hemicolectomy. Hysterectomy.   03/22/2018 Mammogram   Screening mammogram IMPRESSION No evidence of malignancy.     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   06/06/2019 Imaging   1. Tiny bilateral pulmonary nodules stable since 11/12/2018. Continued close attention on follow-up recommended. 2. Incompletely visualized upper abdominal lymphadenopathy better  characterized on the abdomen CT from 2 weeks ago. 3.  Aortic Atherosclerois (ICD10-170.0) 4.  Emphysema. (CLE75-T70.9)   06/07/2019 Procedure   Technically successful CT guided core needle biopsy of indeterminate peritoneal nodule within the right lower abdomen/pelvis.   06/07/2019 Pathology Results   Soft Tissue Needle Core Biopsy, peritoneal nodule within right lower abdomen - METASTATIC CARCINOMA CONSISTENT WITH PATIENT'S CLINICAL HISTORY OF PRIMARY OVARIAN CARCINOMA. SEE NOTE Diagnosis Note Immunohistochemical stains show that the tumor cells are positive for CK7, ER and PAX 8; and negative for CK20 and CDX2. This immunostaining profile is consistent with above diagnosis.   06/16/2019 Tumor Marker   Patient's tumor was tested for the following markers: CA-125 Results of the tumor marker test revealed 499   06/20/2019 - 11/14/2019 Chemotherapy   The patient had carboplatin and taxol   07/11/2019 Tumor Marker   Patient's tumor was tested for the following markers: CA-125 Results of the tumor marker test revealed 438   08/05/2019 Tumor Marker   Patient's tumor was tested for the following markers: CA-125 Results of the tumor marker test revealed 272.   08/25/2019 Imaging   Ct chest, abdomen and pelvis 1. Slight interval decrease in size of a right lower quadrant  peritoneal nodule measuring 1.7 x 1.6 cm, previously 2.2 x 2.2 cm (series 2, image 94, series 5, image 58). Findings are consistent with treatment response.   2. No significant change in ill-defined soft tissue about the sigmoid colon in the hysterectomy bed (series 2, image 101).   3.  Status post hysterectomy.   4. No evidence of metastatic disease in the chest. Stable benign small pulmonary nodules.   5.  Coronary artery disease.  Aortic Atherosclerosis (ICD10-I70.0).     08/26/2019 Tumor Marker   Patient's tumor was tested for the following markers: CA-125 Results of the tumor marker test revealed 189.   09/26/2019  Tumor Marker   Patient's tumor was tested for the following markers: CA-125 Results of the tumor marker test revealed 137   10/03/2019 Tumor Marker   Patient's tumor was tested for the following markers: CA-125 Results of the tumor marker test revealed 137.   10/31/2019 Tumor Marker   Patient's tumor was tested for the following markers: CA-125 Results of the tumor marker test revealed 101   11/29/2019 Imaging   1. Right lower quadrant peritoneal implant is decreased. Stable irregular nodular pelvic peritoneal thickening superior to the vaginal cuff. 2. No new or progressive metastatic disease. No abdominopelvic lymphadenopathy. 3.  Aortic Atherosclerosis (ICD10-I70.0).   12/15/2019 -  Chemotherapy   The patient had Lynparza for chemotherapy treatment.     12/23/2019 Tumor Marker   Patient's tumor was tested for the following markers: CA-125 Results of the tumor marker test revealed 55.9   01/02/2020 Tumor Marker   Patient's tumor was tested for the following markers: CA-125. Results of the tumor marker test revealed 58.6   02/03/2020 Tumor Marker   Patient's tumor was tested for the following markers: CA-125 Results of the tumor marker test revealed 52.2   02/24/2020 Tumor Marker   Patient's tumor was tested for the following markers: CA-125 Results of the tumor marker test revealed 64.8   04/19/2020 Imaging   1. Continued slight interval decrease in size of a soft tissue nodule in the right paracolic gutter, consistent with treatment response. 2. Soft tissue in the hysterectomy bed near the distal sigmoid colon and rectum may be slightly decreased in size, difficult to measure due to configuration but apparently diminished in size, also consistent with treatment response. 3. Status post hysterectomy and oophorectomy as well as right colon resection. 4. No evidence of new metastatic disease in the abdomen or pelvis. 5. Redemonstrated long segment occlusion of the left common, internal,  and external iliac arteries. Status post femoral-femoral bypass grafting with reconstitution of the left common femoral artery. Aortic Atherosclerosis (ICD10-I70.0).   08/15/2020 Tumor Marker   Patient's tumor was tested for the following markers: CA-125 Results of the tumor marker test revealed 47.1   10/22/2020 Imaging   1. Reduced size of the nodule along the right paracolic gutter, currently 0.6 by 1.2 cm, previously 0.7 by 1.4 cm on 04/19/2020 and previously 0.9 by 1.5 cm on 11/29/2019. Appearance compatible with treatment response. 2. Further mild reduction in size of the bandlike density along the rectosigmoid mesentery, probably from scarring or effectively treated tumor. 3. Prominent stool throughout the colon favors constipation.  4. Stable chronic occlusion of the left iliac arteries, with a patent fem-fem bypass. 5. 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 6. Lower lumbar spondylosis and degenerative disc disease causing impingement at L4-5 and L5-S1. 7. Aortic atherosclerosis.  10/23/2020 Tumor Marker   Patient's tumor was tested for the following markers: CA-125 Results of the tumor marker test revealed 74.6   01/21/2021 Tumor Marker   Patient's tumor was tested for the following markers: CA-125 Results of the tumor marker test revealed 825   01/28/2021 Imaging   1. Changes of right hemicolectomy with slightly decreased size of the nodule along the right pericolic gutter and similar band like density along the rectosigmoid mesentery. 2. Two new well-circumscribed low-density nodules in the presacral perirectal space, difficult to ascertain whether they arise from the bowel wall or arise from within the peritoneum. This is a nonspecific finding which could be further evaluated with colonoscopy, repeat CT with rectal contrast or attention on close interval follow-up imaging in 3 months. 3. Stable  chronic occlusion of the left iliac arteries with patent fem-fem bypass. 4. 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 5. Emphysema and aortic atherosclerosis. Aortic Atherosclerosis (ICD10-I70.0) and Emphysema (ICD10-J43.9).   02/08/2021 Procedure   Successful placement of a right internal jugular approach power injectable Port-A-Cath. The catheter is ready for immediate use.     02/11/2021 - 06/06/2021 Chemotherapy   Received carbo/gem and avastin       02/18/2021 Tumor Marker   Patient's tumor was tested for the following markers: CA-125 Results of the tumor marker test revealed 852   03/04/2021 Tumor Marker   Patient's tumor was tested for the following markers: CA-125. Results of the tumor marker test revealed 541   03/11/2021 Tumor Marker   Patient's tumor was tested for the following markers: CA-125 Results of the tumor marker test revealed 568   03/25/2021 Tumor Marker   Patient's tumor was tested for the following markers:CA-125 Results of the tumor marker test revealed 367.   04/08/2021 Tumor Marker   Patient's tumor was tested for the following markers: CA-125 Results of the tumor marker test revealed 273   04/29/2021 Tumor Marker   Patient's tumor was tested for the following markers: CA-125 Results of the tumor marker test revealed 431   06/06/2021 Tumor Marker   Patient's tumor was tested for the following markers: CA-125. Results of the tumor marker test revealed 594.   06/18/2021 Imaging   1. Nodular fascial thickening with subtle increase along the RIGHT pericolic gutter as described. Short interval follow-up may be helpful. 2. Stable appearance of fascial thickening and serosal thickening in the pelvis, potentially indicative of peritoneal disease. 3. Post RIGHT hemicolectomy. 4. Stable chronic occlusion of LEFT iliac vasculature with patent femoral femoral bypass grafting. 5. Signs of avascular necrosis of bilateral femoral heads as  before. 6. Aortic atherosclerosis.   Aortic Atherosclerosis (ICD10-I70.0).     07/16/2021 -  Chemotherapy   Patient is on Treatment Plan : Ovarian Bevacizumab q21d     07/17/2021 Tumor Marker   Patient's tumor was tested for the following markers: CA-125. Results of the tumor marker test revealed 996.   08/06/2021 Tumor Marker   Patient's tumor was tested for the following markers: CA-125. Results of the tumor marker test revealed 975.   10/29/2021 Imaging   1. Unfortunately, there is evidence progression of peritoneal metastasis. Most obvious progression is along the RIGHT pericolic gutter with there is increased peritoneal thickening. 2. Small volume fluid in the pelvis with enhancing peritoneal surface. Peritoneal enhancement is similar; however, the small volume of fluid is increased. 3. Interval increase in size of small periaortic retroperitoneal lymph node.     PHYSICAL EXAMINATION: ECOG PERFORMANCE STATUS:  1 - Symptomatic but completely ambulatory  Vitals:   10/29/21 1229  BP: (!) 178/89  Pulse: 79  Resp: 17  Temp: 97.7 F (36.5 C)  SpO2: 100%   Filed Weights   10/29/21 1229  Weight: 135 lb 12.8 oz (61.6 kg)    GENERAL:alert, no distress and comfortable NEURO: alert & oriented x 3 with fluent speech, no focal motor/sensory deficits  LABORATORY DATA:  I have reviewed the data as listed    Component Value Date/Time   NA 140 09/27/2021 1216   NA 141 10/07/2017 1436   K 4.3 09/27/2021 1216   K 4.4 10/07/2017 1436   CL 107 09/27/2021 1216   CO2 26 09/27/2021 1216   CO2 26 10/07/2017 1436   GLUCOSE 89 09/27/2021 1216   GLUCOSE 85 10/07/2017 1436   BUN 17 09/27/2021 1216   BUN 14.9 10/07/2017 1436   CREATININE 0.90 10/28/2021 0938   CREATININE 0.78 09/27/2021 1216   CREATININE 1.0 10/07/2017 1436   CALCIUM 8.8 (L) 09/27/2021 1216   CALCIUM 9.5 10/07/2017 1436   PROT 6.5 09/27/2021 1216   PROT 7.8 10/07/2017 1436   ALBUMIN 3.8 09/27/2021 1216   ALBUMIN  3.9 10/07/2017 1436   AST 17 09/27/2021 1216   AST 15 10/07/2017 1436   ALT 10 09/27/2021 1216   ALT 7 10/07/2017 1436   ALKPHOS 55 09/27/2021 1216   ALKPHOS 81 10/07/2017 1436   BILITOT 0.4 09/27/2021 1216   BILITOT 0.54 10/07/2017 1436   GFRNONAA >60 09/27/2021 1216   GFRAA >60 08/15/2020 1121    No results found for: SPEP, UPEP  Lab Results  Component Value Date   WBC 5.7 09/27/2021   NEUTROABS 2.5 09/27/2021   HGB 11.9 (L) 09/27/2021   HCT 34.3 (L) 09/27/2021   MCV 101.8 (H) 09/27/2021   PLT 100 (L) 09/27/2021      Chemistry      Component Value Date/Time   NA 140 09/27/2021 1216   NA 141 10/07/2017 1436   K 4.3 09/27/2021 1216   K 4.4 10/07/2017 1436   CL 107 09/27/2021 1216   CO2 26 09/27/2021 1216   CO2 26 10/07/2017 1436   BUN 17 09/27/2021 1216   BUN 14.9 10/07/2017 1436   CREATININE 0.90 10/28/2021 0938   CREATININE 0.78 09/27/2021 1216   CREATININE 1.0 10/07/2017 1436      Component Value Date/Time   CALCIUM 8.8 (L) 09/27/2021 1216   CALCIUM 9.5 10/07/2017 1436   ALKPHOS 55 09/27/2021 1216   ALKPHOS 81 10/07/2017 1436   AST 17 09/27/2021 1216   AST 15 10/07/2017 1436   ALT 10 09/27/2021 1216   ALT 7 10/07/2017 1436   BILITOT 0.4 09/27/2021 1216   BILITOT 0.54 10/07/2017 1436       RADIOGRAPHIC STUDIES: I have reviewed imaging study with the patient I have personally reviewed the radiological images as listed and agreed with the findings in the report. CT ABDOMEN PELVIS W CONTRAST  Result Date: 10/29/2021 CLINICAL DATA:  Ovarian cancer, assess treatment response EXAM: CT ABDOMEN AND PELVIS WITH CONTRAST TECHNIQUE: Multidetector CT imaging of the abdomen and pelvis was performed using the standard protocol following bolus administration of intravenous contrast. CONTRAST:  169m OMNIPAQUE IOHEXOL 300 MG/ML  SOLN COMPARISON:  None. FINDINGS: Lower chest: Lung bases are clear. Hepatobiliary: No focal hepatic lesion. No biliary duct dilatation. Common  bile duct is normal. Pancreas: Pancreas is normal. No ductal dilatation. No pancreatic inflammation. Spleen: Normal spleen Adrenals/urinary tract: Adrenal  glands and kidneys are normal. The ureters and bladder normal. Stomach/Bowel: Stomach, duodenum and small bowel normal. No evidence of bowel obstruction. Prior partial RIGHT colon resection. The colon and rectosigmoid colon are normal. Vascular/Lymphatic: Abdominal aorta is normal caliber with atherosclerotic calcification. There is occlusion of the LEFT common iliac artery with a fem-fem bypass graft. LEFT external iliac lymph node measures 12 mm (image 63/2) compared to 7 mm. Reproductive: Post hysterectomy.  Adnexa unremarkable Other: Within the deep RIGHT pelvis there is a small volume fluid collection with a thin enhancing border (image 57/2). Similar finding to comparison exam however the volume of fluid is increased slightly. The peritoneal enhancement is similar. Along the RIGHT pericolic gutter, lateral to the ascending colon, there is peritoneal thickening to 15 mm (image 44/series 2)a which is increased from approximately 5 mm on comparison exam. Musculoskeletal: No aggressive osseous lesion. IMPRESSION: 1. Unfortunately, there is evidence progression of peritoneal metastasis. Most obvious progression is along the RIGHT pericolic gutter with there is increased peritoneal thickening. 2. Small volume fluid in the pelvis with enhancing peritoneal surface. Peritoneal enhancement is similar; however, the small volume of fluid is increased. 3. Interval increase in size of small periaortic retroperitoneal lymph node. Electronically Signed   By: Suzy Bouchard M.D.   On: 10/29/2021 08:59

## 2021-11-01 ENCOUNTER — Other Ambulatory Visit: Payer: Self-pay | Admitting: Hematology and Oncology

## 2021-11-01 ENCOUNTER — Telehealth: Payer: Self-pay | Admitting: Oncology

## 2021-11-01 DIAGNOSIS — C562 Malignant neoplasm of left ovary: Secondary | ICD-10-CM

## 2021-11-01 NOTE — Telephone Encounter (Signed)
Called Sebrena to see if she has made a treatment decision.  She said she does want treatment and does not care about hair loss.  She wants the treatment that will "get the cancer out of me and make me better."  Advised her that I will let Dr. Alvy Bimler know and we will call her back with appointments.

## 2021-11-01 NOTE — Progress Notes (Signed)
DISCONTINUE ON PATHWAY REGIMEN - Ovarian     A cycle is every 21 days:     Gemcitabine      Carboplatin   **Always confirm dose/schedule in your pharmacy ordering system**  REASON: Disease Progression PRIOR TREATMENT: [id number]: Carboplatin AUC=4 D1 + Gemcitabine 800 mg/m2 D1, 8 q21 Days; Re-evaluate Every 3 Cycles, Treat until Complete Response, Unacceptable Toxicity, or Disease Progression TREATMENT RESPONSE: Partial Response (PR)  START ON PATHWAY REGIMEN - Ovarian     A cycle is every 28 days:     Liposomal doxorubicin   **Always confirm dose/schedule in your pharmacy ordering system**  Patient Characteristics: Recurrent or Progressive Disease, Fourth Line and Beyond BRCA Mutation Status: Present (Germline) Therapeutic Status: Recurrent or Progressive Disease Line of Therapy: Fourth Line and Beyond  Intent of Therapy: Non-Curative / Palliative Intent, Discussed with Patient 

## 2021-11-01 NOTE — Telephone Encounter (Signed)
I ordered echo and LOS to start Rx on 12/20 Please scehdule echo to be done

## 2021-11-01 NOTE — Telephone Encounter (Signed)
Called Catherine Rogers and advised her of echo appointment on 11/11/21 at 11:15 at [facility].  Also discussed to plan on starting chemotherapy on 11/12/21 and to expect a call from the schedulers with the appointment time.  She verbalized understanding and agreement.

## 2021-11-04 ENCOUNTER — Telehealth: Payer: Self-pay | Admitting: Hematology and Oncology

## 2021-11-04 NOTE — Telephone Encounter (Signed)
Scheduled per sch msg. Called and left msg. Mailed printout  

## 2021-11-05 ENCOUNTER — Encounter: Payer: Self-pay | Admitting: Hematology and Oncology

## 2021-11-05 NOTE — Progress Notes (Signed)
Pharmacist Chemotherapy Monitoring - Initial Assessment    Anticipated start date: 11/12/21   The following has been reviewed per standard work regarding the patient's treatment regimen: The patient's diagnosis, treatment plan and drug doses, and organ/hematologic function Lab orders and baseline tests specific to treatment regimen  The treatment plan start date, drug sequencing, and pre-medications Prior authorization status  Patient's documented medication list, including drug-drug interaction screen and prescriptions for anti-emetics and supportive care specific to the treatment regimen The drug concentrations, fluid compatibility, administration routes, and timing of the medications to be used The patient's access for treatment and lifetime cumulative dose history, if applicable  The patient's medication allergies and previous infusion related reactions, if applicable   Changes made to treatment plan:  treatment plan date  Follow up needed:  N/A    Kennith Center, Pharm.D., CPP 11/05/2021@11 :44 AM

## 2021-11-06 ENCOUNTER — Telehealth: Payer: Self-pay

## 2021-11-06 NOTE — Telephone Encounter (Signed)
Returned her call and left a message asking her to call the office back.  She left a message requesting pain medication refill.

## 2021-11-06 NOTE — Telephone Encounter (Signed)
She called back. She has not picked up the Rx that Dr. Alvy Bimler sent on 12/6, she was not aware that a Rx was sent. She will pick up Rx.

## 2021-11-11 ENCOUNTER — Ambulatory Visit (HOSPITAL_COMMUNITY)
Admission: RE | Admit: 2021-11-11 | Discharge: 2021-11-11 | Disposition: A | Discharge status: Home / Self Care | Payer: 59 | Source: Ambulatory Visit | Attending: Hematology and Oncology | Admitting: Hematology and Oncology

## 2021-11-11 ENCOUNTER — Other Ambulatory Visit: Payer: Self-pay

## 2021-11-11 DIAGNOSIS — C562 Malignant neoplasm of left ovary: Secondary | ICD-10-CM

## 2021-11-11 LAB — ECHOCARDIOGRAM COMPLETE
Area-P 1/2: 2.68 cm2
P 1/2 time: 471 msec
S' Lateral: 3.3 cm

## 2021-11-12 ENCOUNTER — Inpatient Hospital Stay: Payer: 59

## 2021-11-12 ENCOUNTER — Encounter: Payer: Self-pay | Admitting: Hematology and Oncology

## 2021-11-12 ENCOUNTER — Inpatient Hospital Stay (HOSPITAL_BASED_OUTPATIENT_CLINIC_OR_DEPARTMENT_OTHER): Payer: 59 | Admitting: Hematology and Oncology

## 2021-11-12 DIAGNOSIS — G609 Hereditary and idiopathic neuropathy, unspecified: Secondary | ICD-10-CM

## 2021-11-12 DIAGNOSIS — C562 Malignant neoplasm of left ovary: Secondary | ICD-10-CM | POA: Diagnosis not present

## 2021-11-12 DIAGNOSIS — D61818 Other pancytopenia: Secondary | ICD-10-CM | POA: Diagnosis not present

## 2021-11-12 DIAGNOSIS — Z5111 Encounter for antineoplastic chemotherapy: Secondary | ICD-10-CM | POA: Diagnosis not present

## 2021-11-12 LAB — CBC WITH DIFFERENTIAL (CANCER CENTER ONLY)
Abs Immature Granulocytes: 0.01 10*3/uL (ref 0.00–0.07)
Basophils Absolute: 0 10*3/uL (ref 0.0–0.1)
Basophils Relative: 0 %
Eosinophils Absolute: 0 10*3/uL (ref 0.0–0.5)
Eosinophils Relative: 1 %
HCT: 35.3 % — ABNORMAL LOW (ref 36.0–46.0)
Hemoglobin: 12.3 g/dL (ref 12.0–15.0)
Immature Granulocytes: 0 %
Lymphocytes Relative: 45 %
Lymphs Abs: 2.3 10*3/uL (ref 0.7–4.0)
MCH: 35 pg — ABNORMAL HIGH (ref 26.0–34.0)
MCHC: 34.8 g/dL (ref 30.0–36.0)
MCV: 100.6 fL — ABNORMAL HIGH (ref 80.0–100.0)
Monocytes Absolute: 0.4 10*3/uL (ref 0.1–1.0)
Monocytes Relative: 8 %
Neutro Abs: 2.3 10*3/uL (ref 1.7–7.7)
Neutrophils Relative %: 46 %
Platelet Count: 122 10*3/uL — ABNORMAL LOW (ref 150–400)
RBC: 3.51 MIL/uL — ABNORMAL LOW (ref 3.87–5.11)
RDW: 13.1 % (ref 11.5–15.5)
WBC Count: 5 10*3/uL (ref 4.0–10.5)
nRBC: 0 % (ref 0.0–0.2)

## 2021-11-12 LAB — CMP (CANCER CENTER ONLY)
ALT: 7 U/L (ref 0–44)
AST: 13 U/L — ABNORMAL LOW (ref 15–41)
Albumin: 4 g/dL (ref 3.5–5.0)
Alkaline Phosphatase: 51 U/L (ref 38–126)
Anion gap: 5 (ref 5–15)
BUN: 23 mg/dL — ABNORMAL HIGH (ref 6–20)
CO2: 27 mmol/L (ref 22–32)
Calcium: 9 mg/dL (ref 8.9–10.3)
Chloride: 106 mmol/L (ref 98–111)
Creatinine: 1.16 mg/dL — ABNORMAL HIGH (ref 0.44–1.00)
GFR, Estimated: 54 mL/min — ABNORMAL LOW (ref >60)
Glucose, Bld: 91 mg/dL (ref 70–99)
Potassium: 4.6 mmol/L (ref 3.5–5.1)
Sodium: 138 mmol/L (ref 135–145)
Total Bilirubin: 0.3 mg/dL (ref 0.3–1.2)
Total Protein: 6.8 g/dL (ref 6.5–8.1)

## 2021-11-12 MED ORDER — DOXORUBICIN HCL LIPOSOMAL CHEMO INJECTION 2 MG/ML
41.0000 mg/m2 | Freq: Once | INTRAVENOUS | Status: AC
  Administered 2021-11-12: 15:00:00 70 mg via INTRAVENOUS
  Filled 2021-11-12: qty 25

## 2021-11-12 MED ORDER — DEXTROSE 5 % IV SOLN: Freq: Once | INTRAVENOUS | Status: AC

## 2021-11-12 MED ORDER — SODIUM CHLORIDE 0.9 % IV SOLN
10.0000 mg | Freq: Once | INTRAVENOUS | Status: AC
  Administered 2021-11-12: 14:00:00 10 mg via INTRAVENOUS
  Filled 2021-11-12: qty 10

## 2021-11-12 MED ORDER — HEPARIN SOD (PORK) LOCK FLUSH 100 UNIT/ML IV SOLN
500.0000 [IU] | Freq: Once | INTRAVENOUS | Status: AC | PRN
  Administered 2021-11-12: 17:00:00 500 [IU]

## 2021-11-12 MED ORDER — SODIUM CHLORIDE 0.9% FLUSH
10.0000 mL | Freq: Once | INTRAVENOUS | Status: AC
  Administered 2021-11-12: 13:00:00 10 mL

## 2021-11-12 MED ORDER — SODIUM CHLORIDE 0.9% FLUSH
10.0000 mL | INTRAVENOUS | Status: DC | PRN
  Administered 2021-11-12: 17:00:00 10 mL

## 2021-11-12 NOTE — Assessment & Plan Note (Signed)
Her baseline echocardiogram is within normal limits Her pancytopenia is almost back to baseline We will proceed with single agent Doxil for at least 3 months before repeating imaging study at the end of March I will see her next month for further follow-up

## 2021-11-12 NOTE — Assessment & Plan Note (Signed)
Her chronic neuropathic pain is stable She will continue current prescribed antihypertensives

## 2021-11-12 NOTE — Progress Notes (Signed)
[place] OFFICE PROGRESS NOTE  Patient Care Team: Marrian Salvage, [place] as PCP - General (Internal Medicine) Judeth Horn, MD as Consulting Physician (General Surgery) Nat Math, MD as Referring Physician (Obstetrics and Gynecology) Elam Dutch, MD as Consulting Physician (Vascular Surgery) Heath Lark, MD as Consulting Physician (Hematology and Oncology)  ASSESSMENT & PLAN:  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 [facility]) Her baseline echocardiogram is within normal limits Her pancytopenia is almost back to baseline We will proceed with single agent Doxil for at least 3 months before repeating imaging study at the end of March I will see her next month for further follow-up  Pancytopenia, acquired [facility]) This is resolving Observe closely  Neuropathy, idiopathic Her chronic neuropathic pain is stable She will continue current prescribed antihypertensives  No orders of the defined types were placed in this encounter.   All questions were answered. The patient knows to call the clinic with any problems, questions or concerns. The total time spent in the appointment was 20 minutes encounter with patients including review of chart and various tests results, discussions about plan of care and coordination of care plan   Heath Lark, MD 11/12/2021 1:54 PM  INTERVAL HISTORY: Please see below for problem oriented charting. she returns for treatment follow-up seen prior to cycle 1 of single agent doxorubicin She is doing well since I saw her She continues to have intermittent abdominal pain and mild neuropathy Otherwise, she have no new symptoms She is in agreement to proceed with single agent doxil  REVIEW OF SYSTEMS:   Constitutional: Denies fevers, chills or abnormal weight loss Eyes: Denies blurriness of vision Ears, nose, mouth, throat, and face: Denies mucositis or sore throat Respiratory: Denies cough, dyspnea or wheezes Cardiovascular: Denies palpitation,  chest discomfort or lower extremity swelling Gastrointestinal:  Denies nausea, heartburn or change in bowel habits Skin: Denies abnormal skin rashes Lymphatics: Denies new lymphadenopathy or easy bruising Neurological:Denies numbness, tingling or new weaknesses Behavioral/Psych: Mood is stable, no new changes  All other systems were reviewed with the patient and are negative.  I have reviewed the past medical history, past surgical history, social history and family history with the patient and they are unchanged from previous note.  ALLERGIES:  is allergic to other, demerol [meperidine], morphine and related, oxycodone, penicillins, strawberry flavor, tylenol [acetaminophen], and ibuprofen.  MEDICATIONS:  Current Outpatient Medications  Medication Sig Dispense Refill   acetaminophen (TYLENOL) 325 MG tablet Take 325 mg by mouth as needed.     amLODipine (NORVASC) 10 MG tablet Take 1 tablet (10 mg total) by mouth daily. 30 tablet 3   atenolol (TENORMIN) 50 MG tablet Take 1 tablet (50 mg total) by mouth daily. 30 tablet 3   docusate sodium 100 MG CAPS Take 100 mg by mouth 2 (two) times daily as needed for mild constipation. 10 capsule 0   doxazosin (CARDURA) 2 MG tablet Take 1 tablet (2 mg total) by mouth daily. 30 tablet 3   HYDROcodone-acetaminophen (NORCO) 10-325 MG tablet Take 1 tablet by mouth every 6 (six) hours as needed. 90 tablet 0   lidocaine (LIDODERM) 5 % PLACE 1 PATCH ONTO THE SKIN EVERY DAY REMOVE AND DISCARD WITHIN 12 HOURS OR AS DIRECTED BY PRESCRIBER 90 patch 1   lidocaine-prilocaine (EMLA) cream Apply to affected area once 30 g 3   losartan (COZAAR) 100 MG tablet Take 1 tablet (100 mg total) by mouth daily. 30 tablet 3   ondansetron (ZOFRAN) 8 MG tablet TAKE 1 TABLET  BY MOUTH EVERY 8 HOURS AS NEEDED. START ON THE 3RD DAY AFTER CHEMOTHERAPY 30 tablet 4   pregabalin (LYRICA) 200 MG capsule Take 1 capsule (200 mg total) by mouth every 8 (eight) hours. Please give patient brand  name Lyrica 90 capsule 4   prochlorperazine (COMPAZINE) 10 MG tablet Take 1 tablet (10 mg total) by mouth every 6 (six) hours as needed (Nausea or vomiting). 30 tablet 1   No current facility-administered medications for this visit.    SUMMARY OF ONCOLOGIC HISTORY: Oncology History Overview Note  BRCA1 positive Progressed on Olaparib and Avastin   Cancer of right colon ([place])  09/16/2014 Imaging   CT abd/pel:  a 3.8cm cecal mass with associated ileocecal intussusception, and a 9.1cm solid and cystic mass in the left pelvis,   09/18/2014 Surgery   right hemicolectomy and terminal ileoectomy   09/18/2014 Pathologic Stage   pT3pN1Mx, tumor extends into pericolonic soft tissue and is less than 83m from the serosal surface, LVI 8-), PNI (-), all 23 node negative, one soft tissue tumor deposit, surgical margins negative.    09/18/2014 Initial Diagnosis   Adenocarcinoma of right colon   08/06/2016 Imaging   CT chest/abd/pelvis with contrast IMPRESSION: 1. No recurrent malignancy identified. 2. 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 On the prior exam from 09/13/2015 there was back flow of contrast in the left external iliac artery to supply the left internal iliac artery ; this back flow of contrast is no longer present in the left external iliac artery is occluded. 3. Several pulmonary nodules at or below 4 mm diameter, unchanged. 4. 3 mm hypodensity posteriorly in the body of the pancreas, not well seen previously and subtle today, likely incidental, but merits observation. 5. 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 Prominent degenerative spurring of both acetabula. 6. Lumbar spondylosis and degenerative disc disease causing prominent impingement at L4-5 and mild impingement at L5-S1.   03/08/2017 Imaging   CT CAP W Contrast 03/08/17 IMPRESSION: Chest Impression: 1. Stable small pulmonary nodules  within LEFT lung. 2. No mediastinal lymphadenopathy.  Abdomen / Pelvis Impression: 1. No evidence of metastatic colorectal cancer or ovarian cancer within the abdomen pelvis. 2. No lymphadenopathy. 3. Postsurgical change consistent RIGHT hemicolectomy and hysterectomy 4. A fem-fem arterial bypass noted.   11/13/2017 Imaging   CT AP w contrast IMPRESSION: 1. No findings of recurrent malignancy in the abdomen or pelvis. 2. Other imaging findings of potential clinical significance:Chronic AVN of both femoral heads without contour abnormality. Lumbar impingement at L4-5 and L5-S1. Aortic Atherosclerosis (ICD10-I70.0). 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 Partial right hemicolectomy. Hysterectomy.   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  [place]2014 Imaging   CT of the abdomen and pelvis showed a 9.1 cm solid and cystic mass in the left pelvis concerning for malignancy. This was discovered during her workup for colon cancer.   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 Tumor size 3.7 cm, no lymph nodes removed. T1cNx. Tumor cells are positive for cytokeratin 7 and estrogen receptor.   11/01/2014 Surgery   B/l Salpingo-oophorectomy.   11/01/2014 Initial Diagnosis   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   11/28/2014 Cancer Staging   Staging form: Ovary, AJCC 7th Edition - Clinical: Stage III (rT3, N0, M0) -  Signed by Heath Lark, MD on 06/09/2019    03/08/2017 Imaging   CT CAP W Contrast 03/08/17 IMPRESSION: Chest Impression:  1. Stable small pulmonary nodules within LEFT lung. 2. No mediastinal lymphadenopathy.  Abdomen / Pelvis Impression:  1. No evidence of metastatic colorectal cancer or ovarian cancer within the abdomen pelvis. 2. No lymphadenopathy. 3. Postsurgical change consistent RIGHT hemicolectomy  and hysterectomy 4. A fem-fem arterial bypass noted.   03/12/2017 Mammogram   MM Right Breast 03/12/17 IMPRESSION: No mammographic or sonographic evidence of malignancy. No correlate identified for the abnormal enhancement seen within the right nipple on MRI of 02/27/2017.    04/02/2017 Pathology Results   Surgical Pathology of Nipple Biopsy: 04/02/17 Diagnosis Nipple Biopsy, Right - BENIGN NIPPLE TISSUE. - NO ATYPIA OR MALIGNANCY.    11/13/2017 Imaging   1. No findings of recurrent malignancy in the abdomen or pelvis. 2. Other imaging findings of potential clinical significance: Chronic AVN of both femoral heads without contour abnormality. Lumbar impingement at L4-5 and L5-S1. Aortic Atherosclerosis (ICD10-I70.0). 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 Partial right hemicolectomy. Hysterectomy.   03/22/2018 Mammogram   Screening mammogram IMPRESSION No evidence of malignancy.     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   06/06/2019 Imaging   1. Tiny bilateral pulmonary nodules stable since 11/12/2018. Continued close attention on follow-up recommended. 2. Incompletely visualized upper abdominal lymphadenopathy better characterized on the abdomen CT from 2 weeks ago. 3.  Aortic Atherosclerois (ICD10-170.0) 4.  Emphysema. (MHD62-I29.9)   06/07/2019 Procedure   Technically successful CT guided core needle biopsy of indeterminate peritoneal nodule within the right lower abdomen/pelvis.   06/07/2019 Pathology Results   Soft Tissue Needle Core Biopsy, peritoneal nodule within right lower abdomen - METASTATIC CARCINOMA CONSISTENT WITH PATIENT'S CLINICAL HISTORY OF PRIMARY OVARIAN  CARCINOMA. SEE NOTE Diagnosis Note Immunohistochemical stains show that the tumor cells are positive for CK7, ER and PAX 8; and negative for CK20 and CDX2. This immunostaining profile is consistent with above diagnosis.   06/16/2019 Tumor Marker   Patient's tumor was tested for the following markers: CA-125 Results of the tumor marker test revealed 499   06/20/2019 - 11/14/2019 Chemotherapy   The patient had carboplatin and taxol   07/11/2019 Tumor Marker   Patient's tumor was tested for the following markers: CA-125 Results of the tumor marker test revealed 438   08/05/2019 Tumor Marker   Patient's tumor was tested for the following markers: CA-125 Results of the tumor marker test revealed 272.   08/25/2019 Imaging   Ct chest, abdomen and pelvis 1. Slight interval decrease in size of a right lower quadrant peritoneal nodule measuring 1.7 x 1.6 cm, previously 2.2 x 2.2 cm (series 2, image 94, series 5, image 58). Findings are consistent with treatment response.   2. No significant change in ill-defined soft tissue about the sigmoid colon in the hysterectomy bed (series 2, image 101).   3.  Status post hysterectomy.   4. No evidence of metastatic disease in the chest. Stable benign small pulmonary nodules.   5.  Coronary artery disease.  Aortic Atherosclerosis (ICD10-I70.0).     08/26/2019 Tumor Marker   Patient's tumor was tested for the following markers: CA-125 Results  of the tumor marker test revealed 189.   09/26/2019 Tumor Marker   Patient's tumor was tested for the following markers: CA-125 Results of the tumor marker test revealed 137   10/03/2019 Tumor Marker   Patient's tumor was tested for the following markers: CA-125 Results of the tumor marker test revealed 137.   10/31/2019 Tumor Marker   Patient's tumor was tested for the following markers: CA-125 Results of the tumor marker test revealed 101   11/29/2019 Imaging   1. Right lower quadrant peritoneal implant is  decreased. Stable irregular nodular pelvic peritoneal thickening superior to the vaginal cuff. 2. No new or progressive metastatic disease. No abdominopelvic lymphadenopathy. 3.  Aortic Atherosclerosis (ICD10-I70.0).   12/15/2019 -  Chemotherapy   The patient had Lynparza for chemotherapy treatment.     12/23/2019 Tumor Marker   Patient's tumor was tested for the following markers: CA-125 Results of the tumor marker test revealed 55.9   01/02/2020 Tumor Marker   Patient's tumor was tested for the following markers: CA-125. Results of the tumor marker test revealed 58.6   02/03/2020 Tumor Marker   Patient's tumor was tested for the following markers: CA-125 Results of the tumor marker test revealed 52.2   02/24/2020 Tumor Marker   Patient's tumor was tested for the following markers: CA-125 Results of the tumor marker test revealed 64.8   04/19/2020 Imaging   1. Continued slight interval decrease in size of a soft tissue nodule in the right paracolic gutter, consistent with treatment response. 2. Soft tissue in the hysterectomy bed near the distal sigmoid colon and rectum may be slightly decreased in size, difficult to measure due to configuration but apparently diminished in size, also consistent with treatment response. 3. Status post hysterectomy and oophorectomy as well as right colon resection. 4. No evidence of new metastatic disease in the abdomen or pelvis. 5. Redemonstrated long segment occlusion of the left common, internal, and external iliac arteries. Status post femoral-femoral bypass grafting with reconstitution of the left common femoral artery. Aortic Atherosclerosis (ICD10-I70.0).   08/15/2020 Tumor Marker   Patient's tumor was tested for the following markers: CA-125 Results of the tumor marker test revealed 47.1   10/22/2020 Imaging   1. Reduced size of the nodule along the right paracolic gutter, currently 0.6 by 1.2 cm, previously 0.7 by 1.4 cm on 04/19/2020 and  previously 0.9 by 1.5 cm on 11/29/2019. Appearance compatible with treatment response. 2. Further mild reduction in size of the bandlike density along the rectosigmoid mesentery, probably from scarring or effectively treated tumor. 3. Prominent stool throughout the colon favors constipation.  4. Stable chronic occlusion of the left iliac arteries, with a patent fem-fem bypass. 5. 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 6. Lower lumbar spondylosis and degenerative disc disease causing impingement at L4-5 and L5-S1. 7. Aortic atherosclerosis.     10/23/2020 Tumor Marker   Patient's tumor was tested for the following markers: CA-125 Results of the tumor marker test revealed 74.6   01/21/2021 Tumor Marker   Patient's tumor was tested for the following markers: CA-125 Results of the tumor marker test revealed 825   01/28/2021 Imaging   1. Changes of right hemicolectomy with slightly decreased size of the nodule along the right pericolic gutter and similar band like density along the rectosigmoid mesentery. 2. Two new well-circumscribed low-density nodules in the presacral perirectal space, difficult to ascertain whether they arise from the bowel wall  or arise from within the peritoneum. This is a nonspecific finding which could be further evaluated with colonoscopy, repeat CT with rectal contrast or attention on close interval follow-up imaging in 3 months. 3. 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 4. 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 5. Emphysema and aortic atherosclerosis. Aortic Atherosclerosis (ICD10-I70.0) and Emphysema (ICD10-J43.9).   02/08/2021 Procedure   Successful placement of a right internal jugular approach power injectable Port-A-Cath. The catheter is ready for immediate use.     02/11/2021 - 06/06/2021 Chemotherapy   Received  carbo/gem and avastin       02/18/2021 Tumor Marker   Patient's tumor was tested for the following markers: CA-125 Results of the tumor marker test revealed 852   03/04/2021 Tumor Marker   Patient's tumor was tested for the following markers: CA-125. Results of the tumor marker test revealed 541   03/11/2021 Tumor Marker   Patient's tumor was tested for the following markers: CA-125 Results of the tumor marker test revealed 568   03/25/2021 Tumor Marker   Patient's tumor was tested for the following markers:CA-125 Results of the tumor marker test revealed 367.   04/08/2021 Tumor Marker   Patient's tumor was tested for the following markers: CA-125 Results of the tumor marker test revealed 273   04/29/2021 Tumor Marker   Patient's tumor was tested for the following markers: CA-125 Results of the tumor marker test revealed 431   06/06/2021 Tumor Marker   Patient's tumor was tested for the following markers: CA-125. Results of the tumor marker test revealed 594.   06/18/2021 Imaging   1. Nodular fascial thickening with subtle increase along the RIGHT pericolic gutter as described. Short interval follow-up may be helpful. 2. Stable appearance of fascial thickening and serosal thickening in the pelvis, potentially indicative of peritoneal disease. 3. Post RIGHT hemicolectomy. 4. Stable chronic occlusion of LEFT iliac vasculature with patent femoral femoral bypass grafting. 5. 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 6. Aortic atherosclerosis.   Aortic Atherosclerosis (ICD10-I70.0).     07/16/2021 - 09/27/2021 Chemotherapy   Patient is on Treatment Plan : Ovarian Bevacizumab q21d     07/17/2021 Tumor Marker   Patient's tumor was tested for the following markers: CA-125. Results of the tumor marker test revealed 996.   08/06/2021 Tumor Marker   Patient's tumor was tested for the following markers: CA-125. Results of the tumor marker test revealed 975.    10/29/2021 Imaging   1. Unfortunately, there is evidence progression of peritoneal metastasis. Most obvious progression is along the RIGHT pericolic gutter with there is increased peritoneal thickening. 2. Small volume fluid in the pelvis with enhancing peritoneal surface. Peritoneal enhancement is similar; however, the small volume of fluid is increased. 3. Interval increase in size of small periaortic retroperitoneal lymph node.   11/12/2021 -  Chemotherapy   Patient is on Treatment Plan : OVARIAN Liposomal Doxorubicin q28d     11/12/2021 Echocardiogram   1. Left ventricular ejection fraction, by estimation, is 60 to 65%. The left ventricle has normal function. The left ventricle has no regional wall motion abnormalities. There is mild asymmetric left ventricular hypertrophy of the septal and anterior segments. Left ventricular diastolic parameters were normal.  2. Right ventricular systolic function is normal. The right ventricular size is normal.  3. The mitral valve is normal in structure. Mild mitral valve regurgitation.  4. The aortic valve is normal in structure. Aortic  valve regurgitation is mild to moderate. No aortic stenosis is present.       PHYSICAL EXAMINATION: ECOG PERFORMANCE STATUS: 1 - Symptomatic but completely ambulatory  Vitals:   11/12/21 1309  BP: 138/73  Pulse: 73  Resp: 18  Temp: (!) 97.4 F (36.3 C)  SpO2: 99%   Filed Weights   11/12/21 1309  Weight: 135 lb 9.6 oz (61.5 kg)    GENERAL:alert, no distress and comfortable SKIN: skin color, texture, turgor are normal, no rashes or significant lesions EYES: normal, Conjunctiva are pink and non-injected, sclera clear OROPHARYNX:no exudate, no erythema and lips, buccal mucosa, and tongue normal  NECK: supple, thyroid normal size, non-tender, without nodularity LYMPH:  no palpable lymphadenopathy in the cervical, axillary or inguinal LUNGS: clear to auscultation and percussion with normal breathing  effort HEART: regular rate & rhythm and no murmurs and no lower extremity edema ABDOMEN:abdomen soft, non-tender and normal bowel sounds Musculoskeletal:no cyanosis of digits and no clubbing  NEURO: alert & oriented x 3 with fluent speech, no focal motor/sensory deficits  LABORATORY DATA:  I have reviewed the data as listed    Component Value Date/Time   NA 140 09/27/2021 1216   NA 141 10/07/2017 1436   K 4.3 09/27/2021 1216   K 4.4 10/07/2017 1436   CL 107 09/27/2021 1216   CO2 26 09/27/2021 1216   CO2 26 10/07/2017 1436   GLUCOSE 89 09/27/2021 1216   GLUCOSE 85 10/07/2017 1436   BUN 17 09/27/2021 1216   BUN 14.9 10/07/2017 1436   CREATININE 0.90 10/28/2021 0938   CREATININE 0.78 09/27/2021 1216   CREATININE 1.0 10/07/2017 1436   CALCIUM 8.8 (L) 09/27/2021 1216   CALCIUM 9.5 10/07/2017 1436   PROT 6.5 09/27/2021 1216   PROT 7.8 10/07/2017 1436   ALBUMIN 3.8 09/27/2021 1216   ALBUMIN 3.9 10/07/2017 1436   AST 17 09/27/2021 1216   AST 15 10/07/2017 1436   ALT 10 09/27/2021 1216   ALT 7 10/07/2017 1436   ALKPHOS 55 09/27/2021 1216   ALKPHOS 81 10/07/2017 1436   BILITOT 0.4 09/27/2021 1216   BILITOT 0.54 10/07/2017 1436   GFRNONAA >60 09/27/2021 1216   GFRAA >60 08/15/2020 1121    No results found for: SPEP, UPEP  Lab Results  Component Value Date   WBC 5.0 11/12/2021   NEUTROABS 2.3 11/12/2021   HGB 12.3 11/12/2021   HCT 35.3 (L) 11/12/2021   MCV 100.6 (H) 11/12/2021   PLT 122 (L) 11/12/2021      Chemistry      Component Value Date/Time   NA 140 09/27/2021 1216   NA 141 10/07/2017 1436   K 4.3 09/27/2021 1216   K 4.4 10/07/2017 1436   CL 107 09/27/2021 1216   CO2 26 09/27/2021 1216   CO2 26 10/07/2017 1436   BUN 17 09/27/2021 1216   BUN 14.9 10/07/2017 1436   CREATININE 0.90 10/28/2021 0938   CREATININE 0.78 09/27/2021 1216   CREATININE 1.0 10/07/2017 1436      Component Value Date/Time   CALCIUM 8.8 (L) 09/27/2021 1216   CALCIUM 9.5 10/07/2017  1436   ALKPHOS 55 09/27/2021 1216   ALKPHOS 81 10/07/2017 1436   AST 17 09/27/2021 1216   AST 15 10/07/2017 1436   ALT 10 09/27/2021 1216   ALT 7 10/07/2017 1436   BILITOT 0.4 09/27/2021 1216   BILITOT 0.54 10/07/2017 1436       RADIOGRAPHIC STUDIES: I have personally reviewed the radiological images as  listed and agreed with the findings in the report. CT ABDOMEN PELVIS W CONTRAST  Result Date: 10/29/2021 CLINICAL DATA:  Ovarian cancer, assess treatment response EXAM: CT ABDOMEN AND PELVIS WITH CONTRAST TECHNIQUE: Multidetector CT imaging of the abdomen and pelvis was performed using the standard protocol following bolus administration of intravenous contrast. CONTRAST:  17m OMNIPAQUE IOHEXOL 300 MG/ML  SOLN COMPARISON:  None. FINDINGS: Lower chest: Lung bases are clear. Hepatobiliary: No focal hepatic lesion. No biliary duct dilatation. Common bile duct is normal. Pancreas: Pancreas is normal. No ductal dilatation. No pancreatic inflammation. Spleen: Normal spleen Adrenals/urinary tract: Adrenal glands and kidneys are normal. The ureters and bladder normal. Stomach/Bowel: Stomach, duodenum and small bowel normal. No evidence of bowel obstruction. Prior partial RIGHT colon resection. The colon and rectosigmoid colon are normal. Vascular/Lymphatic: Abdominal aorta is normal caliber with atherosclerotic calcification. There is occlusion of the LEFT common iliac artery with a fem-fem bypass graft. LEFT external iliac lymph node measures 12 mm (image 63/2) compared to 7 mm. Reproductive: Post hysterectomy.  Adnexa unremarkable Other: Within the deep RIGHT pelvis there is a small volume fluid collection with a thin enhancing border (image 57/2). Similar finding to comparison exam however the volume of fluid is increased slightly. The peritoneal enhancement is similar. Along the RIGHT pericolic gutter, lateral to the ascending colon, there is peritoneal thickening to 15 mm (image 44/series 2)a which is  increased from approximately 5 mm on comparison exam. Musculoskeletal: No aggressive osseous lesion. IMPRESSION: 1. Unfortunately, there is evidence progression of peritoneal metastasis. Most obvious progression is along the RIGHT pericolic gutter with there is increased peritoneal thickening. 2. Small volume fluid in the pelvis with enhancing peritoneal surface. Peritoneal enhancement is similar; however, the small volume of fluid is increased. 3. Interval increase in size of small periaortic retroperitoneal lymph node. Electronically Signed   By: SSuzy BouchardM.D.   On: 10/29/2021 08:59   ECHOCARDIOGRAM COMPLETE  Result Date: 11/11/2021    ECHOCARDIOGRAM REPORT   Patient Name:   TTIARAH SHISLERDate of Exam: 11/11/2021 Medical Rec #:  [id number]      Height:       67.0 in Accession #:    [id number]     Weight:       135.8 lb Date of Birth:  3February 08, 1962      BSA:          1.715 m Patient Age:    89years        BP:           124/74 mmHg Patient Gender: F               HR:           84 bpm. Exam Location:  Outpatient Procedure: 2D Echo, 3D Echo, Cardiac Doppler and Color Doppler Indications:    Chemo Z09  History:        Patient has no prior history of Echocardiogram examinations.                 PAD; Risk Factors:Hypertension. Ovarian Cancer.  Sonographer:    ELenard GallowayBMer[facility] RDCS Referring Phys: [phone] [place] 1. Left ventricular ejection fraction, by estimation, is 60 to 65%. The left ventricle has normal function. The left ventricle has no regional wall motion abnormalities. There is mild asymmetric left ventricular hypertrophy of the septal and anterior segments. Left ventricular diastolic parameters were normal.  2. Right ventricular systolic function is normal. The  right ventricular size is normal.  3. The mitral valve is normal in structure. Mild mitral valve regurgitation.  4. The aortic valve is normal in structure. Aortic valve regurgitation is mild to moderate. No aortic  stenosis is present. FINDINGS  Left Ventricle: Left ventricular ejection fraction, by estimation, is 60 to 65%. The left ventricle has normal function. The left ventricle has no regional wall motion abnormalities. The left ventricular internal cavity size was normal in size. There is  mild asymmetric left ventricular hypertrophy of the septal and anterior segments. Left ventricular diastolic parameters were normal. Right Ventricle: The right ventricular size is normal. Right vetricular wall thickness was not well visualized. Right ventricular systolic function is normal. Left Atrium: Left atrial size was normal in size. Right Atrium: Right atrial size was normal in size. Pericardium: There is no evidence of pericardial effusion. Mitral Valve: The mitral valve is normal in structure. Mild mitral valve regurgitation. Tricuspid Valve: The tricuspid valve is normal in structure. Tricuspid valve regurgitation is trivial. Aortic Valve: The aortic valve is normal in structure. Aortic valve regurgitation is mild to moderate. Aortic regurgitation PHT measures 471 msec. No aortic stenosis is present. Pulmonic Valve: The pulmonic valve was normal in structure. Pulmonic valve regurgitation is trivial. Aorta: The aortic root and ascending aorta are structurally normal, with no evidence of dilitation. IAS/Shunts: The atrial septum is grossly normal.  LEFT VENTRICLE PLAX 2D LVIDd:         4.80 cm   Diastology LVIDs:         3.30 cm   LV e' medial:    7.94 cm/s LV PW:         0.90 cm   LV E/e' medial:  13.4 LV IVS:        1.30 cm   LV e' lateral:   10.30 cm/s LVOT diam:     2.00 cm   LV E/e' lateral: 10.3 LV SV:         65 LV SV Index:   38 LVOT Area:     3.14 cm  RIGHT VENTRICLE RV Basal diam:  3.80 cm RV Mid diam:    2.90 cm RV S prime:     11.20 cm/s TAPSE (M-mode): 2.1 cm LEFT ATRIUM             Index        RIGHT ATRIUM           Index LA diam:        3.30 cm 1.92 cm/m   RA Area:     13.00 cm LA Vol (A2C):   60.8 ml 35.44  ml/m  RA Volume:   32.20 ml  18.77 ml/m LA Vol (A4C):   44.3 ml 25.82 ml/m LA Biplane Vol: 53.7 ml 31.30 ml/m  AORTIC VALVE LVOT Vmax:   117.00 cm/s LVOT Vmean:  68.700 cm/s LVOT VTI:    0.207 m AI PHT:      471 msec  AORTA Ao Root diam: 2.90 cm Ao Asc diam:  2.50 cm MITRAL VALVE MV Area (PHT): 2.68 cm     SHUNTS MV Decel Time: 284 msec     Systemic VTI:  0.21 m MV E velocity: 106.50 cm/s  Systemic Diam: 2.00 cm MV A velocity: 144.00 cm/s MV E/A ratio:  0.74 Mertie Moores MD Electronically signed by Mertie Moores MD Signature Date/Time: 11/11/2021/4:19:39 PM    Final

## 2021-11-12 NOTE — Patient Instructions (Signed)
[facility]  Discharge Instructions: Thank you for choosing [facility] to provide your oncology and hematology care.   If you have a lab appointment with the [facility], please go directly to the [facility] and check in at the registration area.   Wear comfortable clothing and clothing appropriate for easy access to any Portacath or PICC line.   We strive to give you quality time with your provider. You may need to reschedule your appointment if you arrive late (15 or more minutes).  Arriving late affects you and other patients whose appointments are after yours.  Also, if you miss three or more appointments without notifying the office, you may be dismissed from the clinic at the providers discretion.      For prescription refill requests, have your pharmacy contact our office and allow 72 hours for refills to be completed.    Today you received the following chemotherapy and/or immunotherapy agents: Doxil      To help prevent nausea and vomiting after your treatment, we encourage you to take your nausea medication as directed.  BELOW ARE SYMPTOMS THAT SHOULD BE REPORTED IMMEDIATELY: *FEVER GREATER THAN 100.4 F (38 C) OR HIGHER *CHILLS OR SWEATING *NAUSEA AND VOMITING THAT IS NOT CONTROLLED WITH YOUR NAUSEA MEDICATION *UNUSUAL SHORTNESS OF BREATH *UNUSUAL BRUISING OR BLEEDING *URINARY PROBLEMS (pain or burning when urinating, or frequent urination) *BOWEL PROBLEMS (unusual diarrhea, constipation, pain near the anus) TENDERNESS IN MOUTH AND THROAT WITH OR WITHOUT PRESENCE OF ULCERS (sore throat, sores in mouth, or a toothache) UNUSUAL RASH, SWELLING OR PAIN  UNUSUAL VAGINAL DISCHARGE OR ITCHING   Items with * indicate a potential emergency and should be followed up as soon as possible or go to the Emergency Department if any problems should occur.  Please show the CHEMOTHERAPY ALERT CARD or IMMUNOTHERAPY ALERT CARD at check-in to the  Emergency Department and triage nurse.  Should you have questions after your visit or need to cancel or reschedule your appointment, please contact [facility]  Dept: [phone]  and follow the prompts.  Office hours are 8:00 a.m. to 4:30 p.m. Monday - Friday. Please note that voicemails left after 4:00 p.m. may not be returned until the following business day.  We are closed weekends and major holidays. You have access to a nurse at all times for urgent questions. Please call the main number to the clinic Dept: [phone] and follow the prompts.   For any non-urgent questions, you may also contact your provider using MyChart. We now offer e-Visits for anyone 12 and older to request care online for non-urgent symptoms. For details visit [email or web address].   Also download the MyChart app! Go to the app store, search "MyChart", open the app, select [facility], and log in with your MyChart username and password.  Due to Covid, a mask is required upon entering the hospital/clinic. If you do not have a mask, one will be given to you upon arrival. For doctor visits, patients may have 1 support person aged 14 or older with them. For treatment visits, patients cannot have anyone with them due to current Covid guidelines and our immunocompromised population.   Doxorubicin Liposomal injection What is this medication? LIPOSOMAL DOXORUBICIN (LIP oh som al  dox oh ROO bi sin) is a chemotherapy drug. This medicine is used to treat many kinds of cancer like Kaposi's sarcoma, multiple myeloma, and ovarian cancer. This medicine may be used for other purposes;  ask your health care provider or pharmacist if you have questions. COMMON BRAND NAME(S): Doxil, Lipodox What should I tell my care team before I take this medication? They need to know if you have any of these conditions: blood disorders heart disease infection (especially a virus infection such as chickenpox, cold  sores, or herpes) liver disease recent or ongoing radiation therapy an unusual or allergic reaction to doxorubicin, other chemotherapy agents, soybeans, other medicines, foods, dyes, or preservatives pregnant or trying to get pregnant breast-feeding How should I use this medication? This drug is given as an infusion into a vein. It is administered in a hospital or clinic by a specially trained health care professional. If you have pain, swelling, burning or any unusual feeling around the site of your injection, tell your health care professional right away. Talk to your pediatrician regarding the use of this medicine in children. Special care may be needed. Overdosage: If you think you have taken too much of this medicine contact a poison control center or emergency room at once. NOTE: This medicine is only for you. Do not share this medicine with others. What if I miss a dose? It is important not to miss your dose. Call your doctor or health care professional if you are unable to keep an appointment. What may interact with this medication? Do not take this medicine with any of the following medications: zidovudine This medicine may also interact with the following medications: medicines to increase blood counts like filgrastim, pegfilgrastim, sargramostim vaccines Talk to your doctor or health care professional before taking any of these medicines: acetaminophen aspirin ibuprofen ketoprofen naproxen This list may not describe all possible interactions. Give your health care provider a list of all the medicines, herbs, non-prescription drugs, or dietary supplements you use. Also tell them if you smoke, drink alcohol, or use illegal drugs. Some items may interact with your medicine. What should I watch for while using this medication? Your condition will be monitored carefully while you are receiving this medicine. You may need blood work done while you are taking this medicine. This drug  may make you feel generally unwell. This is not uncommon, as chemotherapy can affect healthy cells as well as cancer cells. Report any side effects. Continue your course of treatment even though you feel ill unless your doctor tells you to stop. Your urine may turn orange-red for a few days after your dose. This is not blood. If your urine is dark or brown, call your doctor. In some cases, you may be given additional medicines to help with side effects. Follow all directions for their use. Talk to your doctor about your risk of cancer. You may be more at risk for certain types of cancers if you take this medicine. Do not become pregnant while taking this medicine or for 6 months after stopping it. Women should inform their healthcare professional if they wish to become pregnant or think they may be pregnant. Men should not father a child while taking this medicine and for 6 months after stopping it. There is a potential for serious side effects to an unborn child. Talk to your health care professional or pharmacist for more information. Do not breast-feed an infant while taking this medicine. This medicine has caused ovarian failure in some women. This medicine may make it more difficult to get pregnant. Talk to your healthcare professional if you are concerned about your fertility. This medicine has caused decreased sperm counts in some men. This may make  it more difficult to father a child. Talk to your healthcare professional if you are concerned about your fertility. This medicine may cause a decrease in Co-Enzyme Q-10. You should make sure that you get enough Co-Enzyme Q-10 while you are taking this medicine. Discuss the foods you eat and the vitamins you take with your health care professional. What side effects may I notice from receiving this medication? Side effects that you should report to your doctor or health care professional as soon as possible: allergic reactions like skin rash, itching or  hives, swelling of the face, lips, or tongue low blood counts - this medicine may decrease the number of white blood cells, red blood cells and platelets. You may be at increased risk for infections and bleeding. signs of hand-foot syndrome - tingling or burning, redness, flaking, swelling, small blisters, or small sores on the palms of your hands or the soles of your feet signs of infection - fever or chills, cough, sore throat, pain or difficulty passing urine signs of decreased platelets or bleeding - bruising, pinpoint red spots on the skin, black, tarry stools, blood in the urine signs of decreased red blood cells - unusually weak or tired, fainting spells, lightheadedness back pain, chills, facial flushing, fever, headache, tightness in the chest or throat during the infusion breathing problems chest pain fast, irregular heartbeat mouth pain, redness, sores pain, swelling, redness at site where injected pain, tingling, numbness in the hands or feet swelling of ankles, feet, or hands vomiting Side effects that usually do not require medical attention (report to your doctor or health care professional if they continue or are bothersome): diarrhea hair loss loss of appetite nail discoloration or damage nausea red or watery eyes red colored urine stomach upset This list may not describe all possible side effects. Call your doctor for medical advice about side effects. You may report side effects to FDA at 1-800-FDA-1088. Where should I keep my medication? This drug is given in a hospital or clinic and will not be stored at home. NOTE: This sheet is a summary. It may not cover all possible information. If you have questions about this medicine, talk to your doctor, pharmacist, or health care provider.  2022 Elsevier/Gold Standard (2018-07-21 00:00:00)

## 2021-11-12 NOTE — Assessment & Plan Note (Signed)
This is resolving Observe closely

## 2021-11-13 ENCOUNTER — Telehealth: Payer: Self-pay

## 2021-11-13 NOTE — Telephone Encounter (Signed)
LM for patient to call back to Dr. Calton Dach nurse to update her on how she is doing after her first Doxil treatment yesterday 11-12-20.

## 2021-11-13 NOTE — Telephone Encounter (Signed)
-----   Message from Charleston Poot, RN sent at 11/12/2021  4:17 PM EST ----- Regarding: First time/ Doxil/ Dr Alvy Bimler pt Hello, Patient had first time Doxil today. She tolerated treatment well.  Thanks! Libbie K.

## 2021-11-13 NOTE — Telephone Encounter (Signed)
Received call from pt regarding her first time Doxil treatment yesterday 11/12/21. Pt mentioned she was upset yesterday after being told her cancer was "non-curative" during treatment. Pt states she is managing and overall doing well from her first treatment.

## 2021-11-24 ENCOUNTER — Encounter: Payer: Self-pay | Admitting: Hematology and Oncology

## 2021-11-26 ENCOUNTER — Ambulatory Visit: Payer: 59 | Admitting: Registered Nurse

## 2021-12-06 ENCOUNTER — Telehealth: Payer: Self-pay

## 2021-12-06 ENCOUNTER — Other Ambulatory Visit: Payer: Self-pay | Admitting: Hematology and Oncology

## 2021-12-06 MED ORDER — HYDROCODONE-ACETAMINOPHEN 10-325 MG PO TABS: 1.0000 | ORAL_TABLET | Freq: Four times a day (QID) | ORAL | 0 refills | Status: DC | PRN

## 2021-12-06 NOTE — Telephone Encounter (Signed)
She called and left a message requesting a refill on  Hydrocodone Rx.

## 2021-12-06 NOTE — Telephone Encounter (Signed)
done

## 2021-12-06 NOTE — Telephone Encounter (Signed)
Called and told Rx sent. She verbalized understanding.  

## 2021-12-09 MED FILL — Dexamethasone Sodium Phosphate Inj 100 MG/10ML: INTRAMUSCULAR | Qty: 1 | Status: AC

## 2021-12-10 ENCOUNTER — Inpatient Hospital Stay: Payer: 59 | Attending: Hematology and Oncology

## 2021-12-10 ENCOUNTER — Inpatient Hospital Stay: Payer: 59

## 2021-12-10 ENCOUNTER — Inpatient Hospital Stay (HOSPITAL_BASED_OUTPATIENT_CLINIC_OR_DEPARTMENT_OTHER): Payer: 59 | Admitting: Hematology and Oncology

## 2021-12-10 ENCOUNTER — Other Ambulatory Visit: Payer: Self-pay

## 2021-12-10 VITALS — BP 122/80 | HR 87 | Temp 99.1°F | Resp 18

## 2021-12-10 DIAGNOSIS — Z1509 Genetic susceptibility to other malignant neoplasm: Secondary | ICD-10-CM

## 2021-12-10 DIAGNOSIS — Z9221 Personal history of antineoplastic chemotherapy: Secondary | ICD-10-CM | POA: Insufficient documentation

## 2021-12-10 DIAGNOSIS — Z5112 Encounter for antineoplastic immunotherapy: Secondary | ICD-10-CM | POA: Diagnosis present

## 2021-12-10 DIAGNOSIS — D61818 Other pancytopenia: Secondary | ICD-10-CM | POA: Diagnosis not present

## 2021-12-10 DIAGNOSIS — I10 Essential (primary) hypertension: Secondary | ICD-10-CM | POA: Insufficient documentation

## 2021-12-10 DIAGNOSIS — C786 Secondary malignant neoplasm of retroperitoneum and peritoneum: Secondary | ICD-10-CM | POA: Insufficient documentation

## 2021-12-10 DIAGNOSIS — Z9079 Acquired absence of other genital organ(s): Secondary | ICD-10-CM | POA: Diagnosis not present

## 2021-12-10 DIAGNOSIS — C562 Malignant neoplasm of left ovary: Secondary | ICD-10-CM

## 2021-12-10 DIAGNOSIS — Z90722 Acquired absence of ovaries, bilateral: Secondary | ICD-10-CM | POA: Diagnosis not present

## 2021-12-10 DIAGNOSIS — Z5111 Encounter for antineoplastic chemotherapy: Secondary | ICD-10-CM | POA: Diagnosis not present

## 2021-12-10 DIAGNOSIS — Z79899 Other long term (current) drug therapy: Secondary | ICD-10-CM | POA: Insufficient documentation

## 2021-12-10 DIAGNOSIS — Z1501 Genetic susceptibility to malignant neoplasm of breast: Secondary | ICD-10-CM | POA: Diagnosis not present

## 2021-12-10 DIAGNOSIS — Z85038 Personal history of other malignant neoplasm of large intestine: Secondary | ICD-10-CM | POA: Diagnosis not present

## 2021-12-10 DIAGNOSIS — G893 Neoplasm related pain (acute) (chronic): Secondary | ICD-10-CM | POA: Diagnosis not present

## 2021-12-10 DIAGNOSIS — Z90710 Acquired absence of both cervix and uterus: Secondary | ICD-10-CM | POA: Insufficient documentation

## 2021-12-10 LAB — CMP (CANCER CENTER ONLY)
ALT: 7 U/L (ref 0–44)
AST: 14 U/L — ABNORMAL LOW (ref 15–41)
Albumin: 4.2 g/dL (ref 3.5–5.0)
Alkaline Phosphatase: 48 U/L (ref 38–126)
Anion gap: 10 (ref 5–15)
BUN: 16 mg/dL (ref 6–20)
CO2: 24 mmol/L (ref 22–32)
Calcium: 9.6 mg/dL (ref 8.9–10.3)
Chloride: 105 mmol/L (ref 98–111)
Creatinine: 0.92 mg/dL (ref 0.44–1.00)
GFR, Estimated: >60 mL/min (ref >60)
Glucose, Bld: 106 mg/dL — ABNORMAL HIGH (ref 70–99)
Potassium: 4.3 mmol/L (ref 3.5–5.1)
Sodium: 139 mmol/L (ref 135–145)
Total Bilirubin: 0.4 mg/dL (ref 0.3–1.2)
Total Protein: 7.3 g/dL (ref 6.5–8.1)

## 2021-12-10 LAB — CBC WITH DIFFERENTIAL (CANCER CENTER ONLY)
Abs Immature Granulocytes: 0.01 10*3/uL (ref 0.00–0.07)
Basophils Absolute: 0 10*3/uL (ref 0.0–0.1)
Basophils Relative: 1 %
Eosinophils Absolute: 0 10*3/uL (ref 0.0–0.5)
Eosinophils Relative: 0 %
HCT: 33.8 % — ABNORMAL LOW (ref 36.0–46.0)
Hemoglobin: 11.7 g/dL — ABNORMAL LOW (ref 12.0–15.0)
Immature Granulocytes: 0 %
Lymphocytes Relative: 41 %
Lymphs Abs: 1.8 10*3/uL (ref 0.7–4.0)
MCH: 34.2 pg — ABNORMAL HIGH (ref 26.0–34.0)
MCHC: 34.6 g/dL (ref 30.0–36.0)
MCV: 98.8 fL (ref 80.0–100.0)
Monocytes Absolute: 0.6 10*3/uL (ref 0.1–1.0)
Monocytes Relative: 13 %
Neutro Abs: 2 10*3/uL (ref 1.7–7.7)
Neutrophils Relative %: 45 %
Platelet Count: 126 10*3/uL — ABNORMAL LOW (ref 150–400)
RBC: 3.42 MIL/uL — ABNORMAL LOW (ref 3.87–5.11)
RDW: 13.4 % (ref 11.5–15.5)
WBC Count: 4.4 10*3/uL (ref 4.0–10.5)
nRBC: 0 % (ref 0.0–0.2)

## 2021-12-10 MED ORDER — DOXORUBICIN HCL LIPOSOMAL CHEMO INJECTION 2 MG/ML
42.0000 mg/m2 | Freq: Once | INTRAVENOUS | Status: AC
  Administered 2021-12-10: 70 mg via INTRAVENOUS
  Filled 2021-12-10: qty 10

## 2021-12-10 MED ORDER — SODIUM CHLORIDE 0.9% FLUSH
10.0000 mL | INTRAVENOUS | Status: DC | PRN
  Administered 2021-12-10: 10 mL

## 2021-12-10 MED ORDER — HEPARIN SOD (PORK) LOCK FLUSH 100 UNIT/ML IV SOLN
500.0000 [IU] | Freq: Once | INTRAVENOUS | Status: AC | PRN
  Administered 2021-12-10: 500 [IU]

## 2021-12-10 MED ORDER — SODIUM CHLORIDE 0.9 % IV SOLN
10.0000 mg | Freq: Once | INTRAVENOUS | Status: AC
  Administered 2021-12-10: 10 mg via INTRAVENOUS
  Filled 2021-12-10: qty 10

## 2021-12-10 MED ORDER — DEXTROSE 5 % IV SOLN: Freq: Once | INTRAVENOUS | Status: AC

## 2021-12-10 MED ORDER — SODIUM CHLORIDE 0.9% FLUSH
10.0000 mL | Freq: Once | INTRAVENOUS | Status: AC
  Administered 2021-12-10: 10 mL

## 2021-12-10 NOTE — Patient Instructions (Signed)
[facility]  Discharge Instructions: Thank you for choosing [facility] to provide your oncology and hematology care.   If you have a lab appointment with the [facility], please go directly to the [facility] and check in at the registration area.   Wear comfortable clothing and clothing appropriate for easy access to any Portacath or PICC line.   We strive to give you quality time with your provider. You may need to reschedule your appointment if you arrive late (15 or more minutes).  Arriving late affects you and other patients whose appointments are after yours.  Also, if you miss three or more appointments without notifying the office, you may be dismissed from the clinic at the providers discretion.      For prescription refill requests, have your pharmacy contact our office and allow 72 hours for refills to be completed.    Today you received the following chemotherapy and/or immunotherapy agent: Doxorubicin Liposomal (Doxil)    To help prevent nausea and vomiting after your treatment, we encourage you to take your nausea medication as directed.  BELOW ARE SYMPTOMS THAT SHOULD BE REPORTED IMMEDIATELY: *FEVER GREATER THAN 100.4 F (38 C) OR HIGHER *CHILLS OR SWEATING *NAUSEA AND VOMITING THAT IS NOT CONTROLLED WITH YOUR NAUSEA MEDICATION *UNUSUAL SHORTNESS OF BREATH *UNUSUAL BRUISING OR BLEEDING *URINARY PROBLEMS (pain or burning when urinating, or frequent urination) *BOWEL PROBLEMS (unusual diarrhea, constipation, pain near the anus) TENDERNESS IN MOUTH AND THROAT WITH OR WITHOUT PRESENCE OF ULCERS (sore throat, sores in mouth, or a toothache) UNUSUAL RASH, SWELLING OR PAIN  UNUSUAL VAGINAL DISCHARGE OR ITCHING   Items with * indicate a potential emergency and should be followed up as soon as possible or go to the Emergency Department if any problems should occur.  Please show the CHEMOTHERAPY ALERT CARD or IMMUNOTHERAPY ALERT  CARD at check-in to the Emergency Department and triage nurse.  Should you have questions after your visit or need to cancel or reschedule your appointment, please contact [facility]  Dept: [phone]  and follow the prompts.  Office hours are 8:00 a.m. to 4:30 p.m. Monday - Friday. Please note that voicemails left after 4:00 p.m. may not be returned until the following business day.  We are closed weekends and major holidays. You have access to a nurse at all times for urgent questions. Please call the main number to the clinic Dept: [phone] and follow the prompts.   For any non-urgent questions, you may also contact your provider using MyChart. We now offer e-Visits for anyone 92 and older to request care online for non-urgent symptoms. For details visit [email or web address].   Also download the MyChart app! Go to the app store, search "MyChart", open the app, select [facility], and log in with your MyChart username and password.  Due to Covid, a mask is required upon entering the hospital/clinic. If you do not have a mask, one will be given to you upon arrival. For doctor visits, patients may have 1 support person aged 3 or older with them. For treatment visits, patients cannot have anyone with them due to current Covid guidelines and our immunocompromised population.

## 2021-12-11 ENCOUNTER — Encounter: Payer: Self-pay | Admitting: Hematology and Oncology

## 2021-12-11 NOTE — Assessment & Plan Note (Signed)
The patient is known to have BRCA mutation Unfortunately, her daughter who is 61 years old is recently diagnosed with ovarian cancer Her 1 other daughter had abnormal elevated tumor marker CA125 I reviewed with the patient the importance of having her daughter reviewed urgently

## 2021-12-11 NOTE — Assessment & Plan Note (Signed)
This is resolving Observe closely She does not need dose reduction

## 2021-12-11 NOTE — Assessment & Plan Note (Signed)
She tolerated last cycle of treatment fairly well without major side effects Her pancytopenia is almost back to baseline We will proceed with single agent Doxil for at least 3 months before repeating imaging study at the end of March I will see her next month for further follow-up

## 2021-12-11 NOTE — Assessment & Plan Note (Signed)
She had history of poorly controlled hypertension due to prior treatment with bevacizumab Now, since discontinuation of bevacizumab, her blood pressure is starting to trend down I recommend discontinuation of amlodipine I will continue to monitor her blood pressure closely with plan to discontinue more antihypertensives in the future

## 2021-12-11 NOTE — Assessment & Plan Note (Signed)
She has stable pain control since we resumed chemotherapy She will continue current prescribed pain medicine

## 2021-12-11 NOTE — Progress Notes (Signed)
[place] OFFICE PROGRESS NOTE  Patient Care Team: Marrian Salvage, O[place] as PCP - General (Internal Medicine) Judeth Horn, MD as Consulting Physician (General Surgery) Nat Math, MD as Referring Physician (Obstetrics and Gynecology) Elam Dutch, MD (Inactive) as Consulting Physician (Vascular Surgery) Heath Lark, MD as Consulting Physician (Hematology and Oncology)  ASSESSMENT & PLAN:  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 [facility]) She tolerated last cycle of treatment fairly well without major side effects Her pancytopenia is almost back to baseline We will proceed with single agent Doxil for at least 3 months before repeating imaging study at the end of March I will see her next month for further follow-up  Pancytopenia, acquired [facility]) This is resolving Observe closely She does not need dose reduction  Essential hypertension She had history of poorly controlled hypertension due to prior treatment with bevacizumab Now, since discontinuation of bevacizumab, her blood pressure is starting to trend down I recommend discontinuation of amlodipine I will continue to monitor her blood pressure closely with plan to discontinue more antihypertensives in the future  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   All questions were answered. The patient knows to call the clinic with any problems, questions or concerns. The total time spent in the appointment was 30 minutes encounter with patients including review of chart  and various tests results, discussions about plan of care and coordination of care plan   Heath Lark, MD 12/11/2021 11:54 AM  INTERVAL HISTORY: Please see below for problem oriented charting. she returns for treatment follow-up on single agent Doxil for recurrent ovarian cancer The patient is very tearful She has 6 children, 4 boys and 2 girls One of her daughters was recently diagnosed with ovarian cancer at the age of 19 Her other daughter was found to have elevated tumor marker From treatment perspective, she has no side effects such as nausea, mucositis or changes in bowel habits Her pain appears to be well controlled  REVIEW OF SYSTEMS:   Constitutional: Denies fevers, chills or abnormal weight loss Eyes: Denies blurriness of vision Ears, nose, mouth, throat, and face: Denies mucositis or sore throat Respiratory: Denies cough, dyspnea or wheezes Cardiovascular: Denies palpitation, chest discomfort or lower extremity swelling Gastrointestinal:  Denies nausea, heartburn or change in bowel habits Skin: Denies abnormal skin rashes Lymphatics: Denies new lymphadenopathy or easy bruising Neurological:Denies numbness, tingling or new weaknesses Behavioral/Psych: Mood is stable, no new changes  All other systems were reviewed with the patient and are negative.  I have reviewed the past medical history, past surgical history, social history and family history with the patient and they are unchanged from previous note.  ALLERGIES:  is allergic to other, demerol [meperidine], morphine and related, oxycodone, penicillins, strawberry flavor, tylenol [acetaminophen], and ibuprofen.  MEDICATIONS:  Current Outpatient Medications  Medication Sig Dispense Refill   acetaminophen (TYLENOL) 325 MG tablet Take 325 mg by mouth as needed.     atenolol (TENORMIN) 50 MG tablet Take 1 tablet (50 mg total) by mouth daily. 30 tablet 3   docusate sodium 100 MG CAPS  Take 100 mg by mouth 2 (two) times daily  as needed for mild constipation. 10 capsule 0   doxazosin (CARDURA) 2 MG tablet Take 1 tablet (2 mg total) by mouth daily. 30 tablet 3   HYDROcodone-acetaminophen (NORCO) 10-325 MG tablet Take 1 tablet by mouth every 6 (six) hours as needed. 90 tablet 0   lidocaine (LIDODERM) 5 % PLACE 1 PATCH ONTO THE SKIN EVERY DAY REMOVE AND DISCARD WITHIN 12 HOURS OR AS DIRECTED BY PRESCRIBER 90 patch 1   lidocaine-prilocaine (EMLA) cream Apply to affected area once 30 g 3   losartan (COZAAR) 100 MG tablet Take 1 tablet (100 mg total) by mouth daily. 30 tablet 3   ondansetron (ZOFRAN) 8 MG tablet TAKE 1 TABLET BY MOUTH EVERY 8 HOURS AS NEEDED. START ON THE 3RD DAY AFTER CHEMOTHERAPY 30 tablet 4   pregabalin (LYRICA) 200 MG capsule Take 1 capsule (200 mg total) by mouth every 8 (eight) hours. Please give patient brand name Lyrica 90 capsule 4   prochlorperazine (COMPAZINE) 10 MG tablet Take 1 tablet (10 mg total) by mouth every 6 (six) hours as needed (Nausea or vomiting). 30 tablet 1   No current facility-administered medications for this visit.    SUMMARY OF ONCOLOGIC HISTORY: Oncology History Overview Note  BRCA1 positive Progressed on Olaparib and Avastin   Cancer of right colon ([place])  09/16/2014 Imaging   CT abd/pel:  a 3.8cm cecal mass with associated ileocecal intussusception, and a 9.1cm solid and cystic mass in the left pelvis,   09/18/2014 Surgery   right hemicolectomy and terminal ileoectomy   09/18/2014 Pathologic Stage   pT3pN1Mx, tumor extends into pericolonic soft tissue and is less than 39m from the serosal surface, LVI 8-), PNI (-), all 23 node negative, one soft tissue tumor deposit, surgical margins negative.    09/18/2014 Initial Diagnosis   Adenocarcinoma of right colon   08/06/2016 Imaging   CT chest/abd/pelvis with contrast IMPRESSION: 1. No recurrent malignancy identified. 2. 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 On the prior exam from 09/13/2015 there was back flow of contrast in the left external iliac artery to supply the left internal iliac artery ; this back flow of contrast is no longer present in the left external iliac artery is occluded. 3. Several pulmonary nodules at or below 4 mm diameter, unchanged. 4. 3 mm hypodensity posteriorly in the body of the pancreas, not well seen previously and subtle today, likely incidental, but merits observation. 5. 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 Prominent degenerative spurring of both acetabula. 6. Lumbar spondylosis and degenerative disc disease causing prominent impingement at L4-5 and mild impingement at L5-S1.   03/08/2017 Imaging   CT CAP W Contrast 03/08/17 IMPRESSION: Chest Impression: 1. Stable small pulmonary nodules within LEFT lung. 2. No mediastinal lymphadenopathy.  Abdomen / Pelvis Impression: 1. No evidence of metastatic colorectal cancer or ovarian cancer within the abdomen pelvis. 2. No lymphadenopathy. 3. Postsurgical change consistent RIGHT hemicolectomy and hysterectomy 4. A fem-fem arterial bypass noted.   11/13/2017 Imaging   CT AP w contrast IMPRESSION: 1. No findings of recurrent malignancy in the abdomen or pelvis. 2. Other imaging findings of potential clinical significance:Chronic AVN of both femoral heads without contour abnormality. Lumbar impingement at L4-5 and L5-S1. Aortic Atherosclerosis (ICD10-I70.0). 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 Partial right hemicolectomy.  Hysterectomy.   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 ([place])  10/09/2014 Imaging   CT of the abdomen and pelvis showed a 9.1 cm solid and cystic mass in the left pelvis concerning for malignancy. This was discovered during her workup for colon cancer.   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 Tumor size 3.7 cm, no lymph nodes removed. T1cNx. Tumor cells are positive for cytokeratin 7 and estrogen receptor.   11/01/2014 Surgery   B/l Salpingo-oophorectomy.   11/01/2014 Initial Diagnosis   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   11/28/2014 Cancer Staging   Staging form: Ovary, AJCC 7th Edition - Clinical: Stage III (rT3, N0, M0) - Signed by Heath Lark, MD on 06/09/2019    03/08/2017 Imaging   CT CAP W Contrast 03/08/17 IMPRESSION: Chest Impression:  1. Stable small pulmonary nodules within LEFT lung. 2. No mediastinal lymphadenopathy.  Abdomen / Pelvis Impression:  1. No evidence of metastatic colorectal cancer or ovarian cancer within the abdomen pelvis. 2. No lymphadenopathy. 3. Postsurgical change consistent RIGHT hemicolectomy and hysterectomy 4. A fem-fem arterial bypass noted.   03/12/2017 Mammogram   MM Right Breast 03/12/17 IMPRESSION: No mammographic or sonographic evidence of malignancy. No correlate identified for the abnormal enhancement seen within the right nipple on MRI of 02/27/2017.    04/02/2017 Pathology Results   Surgical Pathology of Nipple Biopsy: 04/02/17 Diagnosis Nipple Biopsy, Right - BENIGN NIPPLE TISSUE. - NO ATYPIA OR MALIGNANCY.    11/13/2017 Imaging   1. No findings of recurrent malignancy in the abdomen or pelvis. 2. Other imaging findings of potential clinical significance: Chronic AVN of both femoral heads without contour abnormality. Lumbar impingement at L4-5 and L5-S1. Aortic Atherosclerosis (ICD10-I70.0). 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 Partial right hemicolectomy. Hysterectomy.   03/22/2018 Mammogram   Screening mammogram IMPRESSION No evidence of malignancy.     05/02/2019 Tumor Marker   Patient's tumor was tested for the following markers: CA-125 Results of the tumor marker  test revealed 360   05/24/2019 Imaging   CT abdomen and pelvis New peritoneal carcinomatosis in the pelvis and right lower quadrant.   06/06/2019 Imaging   1. Tiny bilateral pulmonary nodules stable since 11/12/2018. Continued close attention on follow-up recommended. 2. Incompletely visualized upper abdominal lymphadenopathy better characterized on the abdomen CT from 2 weeks ago. 3.  Aortic Atherosclerois (ICD10-170.0) 4.  Emphysema. (GLO75-I43.9)   06/07/2019 Procedure   Technically successful CT guided core needle biopsy of indeterminate peritoneal nodule within the right lower abdomen/pelvis.   06/07/2019 Pathology Results   Soft Tissue Needle Core Biopsy, peritoneal nodule within right lower abdomen - METASTATIC CARCINOMA CONSISTENT WITH PATIENT'S CLINICAL HISTORY OF PRIMARY OVARIAN CARCINOMA. SEE NOTE Diagnosis Note Immunohistochemical stains show that the tumor cells are positive for CK7, ER and PAX 8; and negative for CK20 and CDX2. This immunostaining profile is consistent with above diagnosis.   06/16/2019 Tumor Marker   Patient's tumor was tested for the following markers: CA-125 Results of the tumor marker test revealed 499   06/20/2019 - 11/14/2019 Chemotherapy   The patient had carboplatin and taxol   07/11/2019 Tumor Marker   Patient's tumor was tested for the following markers: CA-125 Results of the tumor marker test revealed 438   08/05/2019 Tumor Marker   Patient's tumor was tested for the following markers: CA-125 Results of the tumor  marker test revealed 272.   08/25/2019 Imaging   Ct chest, abdomen and pelvis 1. Slight interval decrease in size of a right lower quadrant peritoneal nodule measuring 1.7 x 1.6 cm, previously 2.2 x 2.2 cm (series 2, image 94, series 5, image 58). Findings are consistent with treatment response.   2. No significant change in ill-defined soft tissue about the sigmoid colon in the hysterectomy bed (series 2, image 101).   3.  Status  post hysterectomy.   4. No evidence of metastatic disease in the chest. Stable benign small pulmonary nodules.   5.  Coronary artery disease.  Aortic Atherosclerosis (ICD10-I70.0).     08/26/2019 Tumor Marker   Patient's tumor was tested for the following markers: CA-125 Results of the tumor marker test revealed 189.   09/26/2019 Tumor Marker   Patient's tumor was tested for the following markers: CA-125 Results of the tumor marker test revealed 137   10/03/2019 Tumor Marker   Patient's tumor was tested for the following markers: CA-125 Results of the tumor marker test revealed 137.   10/31/2019 Tumor Marker   Patient's tumor was tested for the following markers: CA-125 Results of the tumor marker test revealed 101   11/29/2019 Imaging   1. Right lower quadrant peritoneal implant is decreased. Stable irregular nodular pelvic peritoneal thickening superior to the vaginal cuff. 2. No new or progressive metastatic disease. No abdominopelvic lymphadenopathy. 3.  Aortic Atherosclerosis (ICD10-I70.0).   12/15/2019 -  Chemotherapy   The patient had Lynparza for chemotherapy treatment.     12/23/2019 Tumor Marker   Patient's tumor was tested for the following markers: CA-125 Results of the tumor marker test revealed 55.9   01/02/2020 Tumor Marker   Patient's tumor was tested for the following markers: CA-125. Results of the tumor marker test revealed 58.6   02/03/2020 Tumor Marker   Patient's tumor was tested for the following markers: CA-125 Results of the tumor marker test revealed 52.2   02/24/2020 Tumor Marker   Patient's tumor was tested for the following markers: CA-125 Results of the tumor marker test revealed 64.8   04/19/2020 Imaging   1. Continued slight interval decrease in size of a soft tissue nodule in the right paracolic gutter, consistent with treatment response. 2. Soft tissue in the hysterectomy bed near the distal sigmoid colon and rectum may be slightly decreased in  size, difficult to measure due to configuration but apparently diminished in size, also consistent with treatment response. 3. Status post hysterectomy and oophorectomy as well as right colon resection. 4. No evidence of new metastatic disease in the abdomen or pelvis. 5. Redemonstrated long segment occlusion of the left common, internal, and external iliac arteries. Status post femoral-femoral bypass grafting with reconstitution of the left common femoral artery. Aortic Atherosclerosis (ICD10-I70.0).   08/15/2020 Tumor Marker   Patient's tumor was tested for the following markers: CA-125 Results of the tumor marker test revealed 47.1   10/22/2020 Imaging   1. Reduced size of the nodule along the right paracolic gutter, currently 0.6 by 1.2 cm, previously 0.7 by 1.4 cm on 04/19/2020 and previously 0.9 by 1.5 cm on 11/29/2019. Appearance compatible with treatment response. 2. Further mild reduction in size of the bandlike density along the rectosigmoid mesentery, probably from scarring or effectively treated tumor. 3. Prominent stool throughout the colon favors constipation.  4. Stable chronic occlusion of the left iliac arteries, with a patent fem-fem bypass. 5. 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 6. Lower lumbar spondylosis and degenerative disc disease causing impingement at L4-5 and L5-S1. 7. Aortic atherosclerosis.     10/23/2020 Tumor Marker   Patient's tumor was tested for the following markers: CA-125 Results of the tumor marker test revealed 74.6   01/21/2021 Tumor Marker   Patient's tumor was tested for the following markers: CA-125 Results of the tumor marker test revealed 825   01/28/2021 Imaging   1. Changes of right hemicolectomy with slightly decreased size of the nodule along the right pericolic gutter and similar band like density along the rectosigmoid mesentery. 2. Two new well-circumscribed  low-density nodules in the presacral perirectal space, difficult to ascertain whether they arise from the bowel wall or arise from within the peritoneum. This is a nonspecific finding which could be further evaluated with colonoscopy, repeat CT with rectal contrast or attention on close interval follow-up imaging in 3 months. 3. 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 4. 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 5. Emphysema and aortic atherosclerosis. Aortic Atherosclerosis (ICD10-I70.0) and Emphysema (ICD10-J43.9).   02/08/2021 Procedure   Successful placement of a right internal jugular approach power injectable Port-A-Cath. The catheter is ready for immediate use.     02/11/2021 - 06/06/2021 Chemotherapy   Received carbo/gem and avastin       02/18/2021 Tumor Marker   Patient's tumor was tested for the following markers: CA-125 Results of the tumor marker test revealed 852   03/04/2021 Tumor Marker   Patient's tumor was tested for the following markers: CA-125. Results of the tumor marker test revealed 541   03/11/2021 Tumor Marker   Patient's tumor was tested for the following markers: CA-125 Results of the tumor marker test revealed 568   03/25/2021 Tumor Marker   Patient's tumor was tested for the following markers:CA-125 Results of the tumor marker test revealed 367.   04/08/2021 Tumor Marker   Patient's tumor was tested for the following markers: CA-125 Results of the tumor marker test revealed 273   04/29/2021 Tumor Marker   Patient's tumor was tested for the following markers: CA-125 Results of the tumor marker test revealed 431   06/06/2021 Tumor Marker   Patient's tumor was tested for the following markers: CA-125. Results of the tumor marker test revealed 594.   06/18/2021 Imaging   1. Nodular fascial thickening with subtle increase along the RIGHT pericolic gutter as described. Short interval follow-up may be  helpful. 2. Stable appearance of fascial thickening and serosal thickening in the pelvis, potentially indicative of peritoneal disease. 3. Post RIGHT hemicolectomy. 4. Stable chronic occlusion of LEFT iliac vasculature with patent femoral femoral bypass grafting. 5. 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 6. Aortic atherosclerosis.   Aortic Atherosclerosis (ICD10-I70.0).     07/16/2021 - 09/27/2021 Chemotherapy   Patient is on Treatment Plan : Ovarian Bevacizumab q21d     07/17/2021 Tumor Marker   Patient's tumor was tested for the following markers: CA-125. Results of the tumor marker test revealed 996.   08/06/2021 Tumor Marker   Patient's tumor was tested for the following markers: CA-125. Results of the tumor marker test revealed 975.   10/29/2021 Imaging   1. Unfortunately, there is evidence progression of peritoneal metastasis. Most obvious progression is along the RIGHT pericolic gutter with there is increased peritoneal thickening. 2. Small volume fluid in the pelvis with enhancing peritoneal surface. Peritoneal enhancement is similar;  however, the small volume of fluid is increased. 3. Interval increase in size of small periaortic retroperitoneal lymph node.   11/12/2021 -  Chemotherapy   Patient is on Treatment Plan : OVARIAN Liposomal Doxorubicin q28d     11/12/2021 Echocardiogram   1. Left ventricular ejection fraction, by estimation, is 60 to 65%. The left ventricle has normal function. The left ventricle has no regional wall motion abnormalities. There is mild asymmetric left ventricular hypertrophy of the septal and anterior segments. Left ventricular diastolic parameters were normal.  2. Right ventricular systolic function is normal. The right ventricular size is normal.  3. The mitral valve is normal in structure. Mild mitral valve regurgitation.  4. The aortic valve is normal in structure. Aortic valve regurgitation is mild to moderate. No  aortic stenosis is present.       PHYSICAL EXAMINATION: ECOG PERFORMANCE STATUS: 1 - Symptomatic but completely ambulatory  Vitals:   12/10/21 1237  BP: (!) 121/91  Pulse: 88  Resp: 18  Temp: (!) 97.4 F (36.3 C)  SpO2: 100%   Filed Weights   12/10/21 1237  Weight: 130 lb 12.8 oz (59.3 kg)    GENERAL:alert, no distress and comfortable SKIN: skin color, texture, turgor are normal, no rashes or significant lesions EYES: normal, Conjunctiva are pink and non-injected, sclera clear OROPHARYNX:no exudate, no erythema and lips, buccal mucosa, and tongue normal  NECK: supple, thyroid normal size, non-tender, without nodularity LYMPH:  no palpable lymphadenopathy in the cervical, axillary or inguinal LUNGS: clear to auscultation and percussion with normal breathing effort HEART: regular rate & rhythm and no murmurs and no lower extremity edema ABDOMEN:abdomen soft, non-tender and normal bowel sounds Musculoskeletal:no cyanosis of digits and no clubbing  NEURO: alert & oriented x 3 with fluent speech, no focal motor/sensory deficits  LABORATORY DATA:  I have reviewed the data as listed    Component Value Date/Time   NA 139 12/10/2021 1212   NA 141 10/07/2017 1436   K 4.3 12/10/2021 1212   K 4.4 10/07/2017 1436   CL 105 12/10/2021 1212   CO2 24 12/10/2021 1212   CO2 26 10/07/2017 1436   GLUCOSE 106 (H) 12/10/2021 1212   GLUCOSE 85 10/07/2017 1436   BUN 16 12/10/2021 1212   BUN 14.9 10/07/2017 1436   CREATININE 0.92 12/10/2021 1212   CREATININE 1.0 10/07/2017 1436   CALCIUM 9.6 12/10/2021 1212   CALCIUM 9.5 10/07/2017 1436   PROT 7.3 12/10/2021 1212   PROT 7.8 10/07/2017 1436   ALBUMIN 4.2 12/10/2021 1212   ALBUMIN 3.9 10/07/2017 1436   AST 14 (L) 12/10/2021 1212   AST 15 10/07/2017 1436   ALT 7 12/10/2021 1212   ALT 7 10/07/2017 1436   ALKPHOS 48 12/10/2021 1212   ALKPHOS 81 10/07/2017 1436   BILITOT 0.4 12/10/2021 1212   BILITOT 0.54 10/07/2017 1436   GFRNONAA  >60 12/10/2021 1212   GFRAA >60 08/15/2020 1121    No results found for: SPEP, UPEP  Lab Results  Component Value Date   WBC 4.4 12/10/2021   NEUTROABS 2.0 12/10/2021   HGB 11.7 (L) 12/10/2021   HCT 33.8 (L) 12/10/2021   MCV 98.8 12/10/2021   PLT 126 (L) 12/10/2021      Chemistry      Component Value Date/Time   NA 139 12/10/2021 1212   NA 141 10/07/2017 1436   K 4.3 12/10/2021 1212   K 4.4 10/07/2017 1436   CL 105 12/10/2021 1212   CO2 24  12/10/2021 1212   CO2 26 10/07/2017 1436   BUN 16 12/10/2021 1212   BUN 14.9 10/07/2017 1436   CREATININE 0.92 12/10/2021 1212   CREATININE 1.0 10/07/2017 1436      Component Value Date/Time   CALCIUM 9.6 12/10/2021 1212   CALCIUM 9.5 10/07/2017 1436   ALKPHOS 48 12/10/2021 1212   ALKPHOS 81 10/07/2017 1436   AST 14 (L) 12/10/2021 1212   AST 15 10/07/2017 1436   ALT 7 12/10/2021 1212   ALT 7 10/07/2017 1436   BILITOT 0.4 12/10/2021 1212   BILITOT 0.54 10/07/2017 1436

## 2022-01-01 ENCOUNTER — Ambulatory Visit: Payer: 59 | Admitting: Registered Nurse

## 2022-01-02 ENCOUNTER — Other Ambulatory Visit: Payer: Self-pay | Admitting: Hematology and Oncology

## 2022-01-03 ENCOUNTER — Encounter: Payer: Self-pay | Admitting: Hematology and Oncology

## 2022-01-07 ENCOUNTER — Other Ambulatory Visit: Payer: Self-pay | Admitting: Hematology and Oncology

## 2022-01-07 ENCOUNTER — Telehealth: Payer: Self-pay

## 2022-01-07 MED ORDER — HYDROCODONE-ACETAMINOPHEN 10-325 MG PO TABS: 1.0000 | ORAL_TABLET | Freq: Four times a day (QID) | ORAL | 0 refills | Status: DC | PRN

## 2022-01-07 NOTE — Telephone Encounter (Signed)
Called and told Rx sent. She verbalized understanding.  

## 2022-01-07 NOTE — Telephone Encounter (Signed)
done

## 2022-01-07 NOTE — Telephone Encounter (Signed)
She called and left a message asking for a refill on Hydrocodone Rx.

## 2022-01-08 MED FILL — Dexamethasone Sodium Phosphate Inj 100 MG/10ML: INTRAMUSCULAR | Qty: 1 | Status: AC

## 2022-01-09 ENCOUNTER — Inpatient Hospital Stay (HOSPITAL_BASED_OUTPATIENT_CLINIC_OR_DEPARTMENT_OTHER): Payer: 59 | Admitting: Hematology and Oncology

## 2022-01-09 ENCOUNTER — Inpatient Hospital Stay: Payer: 59

## 2022-01-09 ENCOUNTER — Other Ambulatory Visit: Payer: Self-pay | Admitting: Hematology and Oncology

## 2022-01-09 ENCOUNTER — Inpatient Hospital Stay: Payer: 59 | Attending: Hematology and Oncology

## 2022-01-09 ENCOUNTER — Encounter: Payer: Self-pay | Admitting: Hematology and Oncology

## 2022-01-09 ENCOUNTER — Other Ambulatory Visit: Payer: Self-pay

## 2022-01-09 VITALS — BP 126/82 | HR 92 | Temp 97.8°F | Resp 18 | Ht 67.0 in | Wt 131.4 lb

## 2022-01-09 DIAGNOSIS — Z5111 Encounter for antineoplastic chemotherapy: Secondary | ICD-10-CM | POA: Diagnosis not present

## 2022-01-09 DIAGNOSIS — Z85038 Personal history of other malignant neoplasm of large intestine: Secondary | ICD-10-CM | POA: Diagnosis not present

## 2022-01-09 DIAGNOSIS — D61818 Other pancytopenia: Secondary | ICD-10-CM | POA: Insufficient documentation

## 2022-01-09 DIAGNOSIS — C562 Malignant neoplasm of left ovary: Secondary | ICD-10-CM

## 2022-01-09 DIAGNOSIS — Z9079 Acquired absence of other genital organ(s): Secondary | ICD-10-CM | POA: Diagnosis not present

## 2022-01-09 DIAGNOSIS — Z90710 Acquired absence of both cervix and uterus: Secondary | ICD-10-CM | POA: Insufficient documentation

## 2022-01-09 DIAGNOSIS — Z9221 Personal history of antineoplastic chemotherapy: Secondary | ICD-10-CM | POA: Diagnosis not present

## 2022-01-09 DIAGNOSIS — Z90722 Acquired absence of ovaries, bilateral: Secondary | ICD-10-CM | POA: Insufficient documentation

## 2022-01-09 DIAGNOSIS — C786 Secondary malignant neoplasm of retroperitoneum and peritoneum: Secondary | ICD-10-CM | POA: Insufficient documentation

## 2022-01-09 DIAGNOSIS — I10 Essential (primary) hypertension: Secondary | ICD-10-CM | POA: Insufficient documentation

## 2022-01-09 DIAGNOSIS — G893 Neoplasm related pain (acute) (chronic): Secondary | ICD-10-CM | POA: Insufficient documentation

## 2022-01-09 DIAGNOSIS — Z5112 Encounter for antineoplastic immunotherapy: Secondary | ICD-10-CM | POA: Diagnosis present

## 2022-01-09 DIAGNOSIS — Z79899 Other long term (current) drug therapy: Secondary | ICD-10-CM | POA: Insufficient documentation

## 2022-01-09 LAB — CMP (CANCER CENTER ONLY)
ALT: 11 U/L (ref 0–44)
AST: 23 U/L (ref 15–41)
Albumin: 4.2 g/dL (ref 3.5–5.0)
Alkaline Phosphatase: 47 U/L (ref 38–126)
Anion gap: 6 (ref 5–15)
BUN: 19 mg/dL (ref 6–20)
CO2: 26 mmol/L (ref 22–32)
Calcium: 9.2 mg/dL (ref 8.9–10.3)
Chloride: 107 mmol/L (ref 98–111)
Creatinine: 0.94 mg/dL (ref 0.44–1.00)
GFR, Estimated: >60 mL/min (ref >60)
Glucose, Bld: 99 mg/dL (ref 70–99)
Potassium: 4.3 mmol/L (ref 3.5–5.1)
Sodium: 139 mmol/L (ref 135–145)
Total Bilirubin: 0.4 mg/dL (ref 0.3–1.2)
Total Protein: 7 g/dL (ref 6.5–8.1)

## 2022-01-09 LAB — CBC WITH DIFFERENTIAL (CANCER CENTER ONLY)
Abs Immature Granulocytes: 0.01 10*3/uL (ref 0.00–0.07)
Basophils Absolute: 0 10*3/uL (ref 0.0–0.1)
Basophils Relative: 1 %
Eosinophils Absolute: 0 10*3/uL (ref 0.0–0.5)
Eosinophils Relative: 1 %
HCT: 31.2 % — ABNORMAL LOW (ref 36.0–46.0)
Hemoglobin: 10.8 g/dL — ABNORMAL LOW (ref 12.0–15.0)
Immature Granulocytes: 0 %
Lymphocytes Relative: 43 %
Lymphs Abs: 1.7 10*3/uL (ref 0.7–4.0)
MCH: 34.7 pg — ABNORMAL HIGH (ref 26.0–34.0)
MCHC: 34.6 g/dL (ref 30.0–36.0)
MCV: 100.3 fL — ABNORMAL HIGH (ref 80.0–100.0)
Monocytes Absolute: 0.8 10*3/uL (ref 0.1–1.0)
Monocytes Relative: 21 %
Neutro Abs: 1.3 10*3/uL — ABNORMAL LOW (ref 1.7–7.7)
Neutrophils Relative %: 34 %
Platelet Count: 134 10*3/uL — ABNORMAL LOW (ref 150–400)
RBC: 3.11 MIL/uL — ABNORMAL LOW (ref 3.87–5.11)
RDW: 14.9 % (ref 11.5–15.5)
WBC Count: 3.8 10*3/uL — ABNORMAL LOW (ref 4.0–10.5)
nRBC: 0 % (ref 0.0–0.2)

## 2022-01-09 MED ORDER — SODIUM CHLORIDE 0.9% FLUSH
10.0000 mL | INTRAVENOUS | Status: DC | PRN
  Administered 2022-01-09: 10 mL

## 2022-01-09 MED ORDER — SODIUM CHLORIDE 0.9 % IV SOLN
10.0000 mg | Freq: Once | INTRAVENOUS | Status: AC
  Administered 2022-01-09: 10 mg via INTRAVENOUS
  Filled 2022-01-09: qty 10

## 2022-01-09 MED ORDER — DOXORUBICIN HCL LIPOSOMAL CHEMO INJECTION 2 MG/ML
42.0000 mg/m2 | Freq: Once | INTRAVENOUS | Status: AC
  Administered 2022-01-09: 70 mg via INTRAVENOUS
  Filled 2022-01-09: qty 25

## 2022-01-09 MED ORDER — SODIUM CHLORIDE 0.9% FLUSH
10.0000 mL | Freq: Once | INTRAVENOUS | Status: AC
  Administered 2022-01-09: 10 mL

## 2022-01-09 MED ORDER — HEPARIN SOD (PORK) LOCK FLUSH 100 UNIT/ML IV SOLN
500.0000 [IU] | Freq: Once | INTRAVENOUS | Status: AC | PRN
  Administered 2022-01-09: 500 [IU]

## 2022-01-09 MED ORDER — DEXTROSE 5 % IV SOLN: Freq: Once | INTRAVENOUS | Status: AC

## 2022-01-09 NOTE — Assessment & Plan Note (Signed)
She has stable pain control since we resumed chemotherapy She will continue current prescribed pain medicine

## 2022-01-09 NOTE — Progress Notes (Signed)
[facility] OFFICE PROGRESS NOTE  Patient Care Team: Marrian Salvage, [place] as PCP - General (Internal Medicine) Judeth Horn, MD as Consulting Physician (General Surgery) Nat Math, MD as Referring Physician (Obstetrics and Gynecology) Elam Dutch, MD (Inactive) as Consulting Physician (Vascular Surgery) Heath Lark, MD as Consulting Physician (Hematology and Oncology)  ASSESSMENT & PLAN:  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 [facility]) She tolerated last cycle of treatment fairly well without major side effects Her pancytopenia is stable She is due for repeat imaging study and I will order it before her next treatment I will see her next month for further follow-up  Pancytopenia, acquired ([place]) This is stable Observe closely She does not need dose reduction  Cancer associated pain She has stable pain control since we resumed chemotherapy She will continue current prescribed pain medicine  Orders Placed This Encounter  Procedures   CT ABDOMEN PELVIS W CONTRAST    Standing Status:   Future    Standing Expiration Date:   01/09/2023    Order Specific Question:   If indicated for the ordered procedure, I authorize the administration of contrast media per Radiology protocol    Answer:   Yes    Order Specific Question:   Preferred imaging location?    Answer:   [facility]    Order Specific Question:   Radiology Contrast Protocol - do NOT remove file path    Answer:   [email or web address]    Order Specific Question:   Is patient pregnant?    Answer:   No    All questions were answered. The patient knows to call the clinic with any problems, questions or concerns. The total time spent in the appointment was 25 minutes encounter with patients including review of chart and various tests results, discussions about plan of care and coordination of care plan   Heath Lark, MD 01/09/2022 12:39 PM  INTERVAL HISTORY: Please see  below for problem oriented charting. she returns for treatment follow-up to be seen prior to next dose of Doxil for recurrent ovarian cancer She is doing well Her chronic pain is stable She had some dry skin on her palms No mucositis, nausea or changes in bowel habits  REVIEW OF SYSTEMS:   Constitutional: Denies fevers, chills or abnormal weight loss Eyes: Denies blurriness of vision Ears, nose, mouth, throat, and face: Denies mucositis or sore throat Respiratory: Denies cough, dyspnea or wheezes Cardiovascular: Denies palpitation, chest discomfort or lower extremity swelling Gastrointestinal:  Denies nausea, heartburn or change in bowel habits Skin: Denies abnormal skin rashes Lymphatics: Denies new lymphadenopathy or easy bruising Neurological:Denies numbness, tingling or new weaknesses Behavioral/Psych: Mood is stable, no new changes  All other systems were reviewed with the patient and are negative.  I have reviewed the past medical history, past surgical history, social history and family history with the patient and they are unchanged from previous note.  ALLERGIES:  is allergic to other, demerol [meperidine], morphine and related, oxycodone, penicillins, strawberry flavor, tylenol [acetaminophen], and ibuprofen.  MEDICATIONS:  Current Outpatient Medications  Medication Sig Dispense Refill   acetaminophen (TYLENOL) 325 MG tablet Take 325 mg by mouth as needed.     atenolol (TENORMIN) 50 MG tablet Take 1 tablet (50 mg total) by mouth daily. 30 tablet 3   docusate sodium 100 MG CAPS Take 100 mg by mouth 2 (two) times daily as needed for mild constipation. 10 capsule 0   doxazosin (CARDURA) 2 MG tablet Take 1 tablet (2 mg  total) by mouth daily. 30 tablet 3   HYDROcodone-acetaminophen (NORCO) 10-325 MG tablet Take 1 tablet by mouth every 6 (six) hours as needed. 90 tablet 0   lidocaine (LIDODERM) 5 % PLACE 1 PATCH ONTO THE SKIN EVERY DAY REMOVE AND DISCARD WITHIN 12 HOURS OR AS  DIRECTED BY PRESCRIBER 90 patch 1   lidocaine-prilocaine (EMLA) cream Apply to affected area once 30 g 3   losartan (COZAAR) 100 MG tablet Take 1 tablet (100 mg total) by mouth daily. 30 tablet 3   LYRICA 200 MG capsule TAKE ONE CAPSULE BY MOUTH EVERY 8 HOURS. PLEASE GIVE PATIENT BRAND NAME LYRICA 90 capsule 4   ondansetron (ZOFRAN) 8 MG tablet TAKE 1 TABLET BY MOUTH EVERY 8 HOURS AS NEEDED. START ON THE 3RD DAY AFTER CHEMOTHERAPY 30 tablet 4   prochlorperazine (COMPAZINE) 10 MG tablet Take 1 tablet (10 mg total) by mouth every 6 (six) hours as needed (Nausea or vomiting). 30 tablet 1   No current facility-administered medications for this visit.    SUMMARY OF ONCOLOGIC HISTORY: Oncology History Overview Note  BRCA1 positive Progressed on Olaparib and Avastin   Cancer of right colon ([place])  09/16/2014 Imaging   CT abd/pel:  a 3.8cm cecal mass with associated ileocecal intussusception, and a 9.1cm solid and cystic mass in the left pelvis,   09/18/2014 Surgery   right hemicolectomy and terminal ileoectomy   09/18/2014 Pathologic Stage   pT3pN1Mx, tumor extends into pericolonic soft tissue and is less than 48m from the serosal surface, LVI 8-), PNI (-), all 23 node negative, one soft tissue tumor deposit, surgical margins negative.    09/18/2014 Initial Diagnosis   Adenocarcinoma of right colon   08/06/2016 Imaging   CT chest/abd/pelvis with contrast IMPRESSION: 1. No recurrent malignancy identified. 2. 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 On the prior exam from 09/13/2015 there was back flow of contrast in the left external iliac artery to supply the left internal iliac artery ; this back flow of contrast is no longer present in the left external iliac artery is occluded. 3. Several pulmonary nodules at or below 4 mm diameter, unchanged. 4. 3 mm hypodensity posteriorly in the body of the pancreas, not well  seen previously and subtle today, likely incidental, but merits observation. 5. 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 Prominent degenerative spurring of both acetabula. 6. Lumbar spondylosis and degenerative disc disease causing prominent impingement at L4-5 and mild impingement at L5-S1.   03/08/2017 Imaging   CT CAP W Contrast 03/08/17 IMPRESSION: Chest Impression: 1. Stable small pulmonary nodules within LEFT lung. 2. No mediastinal lymphadenopathy.  Abdomen / Pelvis Impression: 1. No evidence of metastatic colorectal cancer or ovarian cancer within the abdomen pelvis. 2. No lymphadenopathy. 3. Postsurgical change consistent RIGHT hemicolectomy and hysterectomy 4. A fem-fem arterial bypass noted.   11/13/2017 Imaging   CT AP w contrast IMPRESSION: 1. No findings of recurrent malignancy in the abdomen or pelvis. 2. Other imaging findings of potential clinical significance:Chronic AVN of both femoral heads without contour abnormality. Lumbar impingement at L4-5 and L5-S1. Aortic Atherosclerosis (ICD10-I70.0). 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 Partial right hemicolectomy. Hysterectomy.   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  10/09/2014 Imaging   CT of the abdomen and pelvis showed a 9.1 cm solid and cystic mass in the left pelvis concerning for malignancy.  This was discovered during her workup for colon cancer.   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 Tumor size 3.7 cm, no lymph nodes removed. T1cNx. Tumor cells are positive for cytokeratin 7 and estrogen receptor.   11/01/2014 Surgery   B/l Salpingo-oophorectomy.   11/01/2014 Initial Diagnosis   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   11/28/2014 Cancer Staging   Staging form: Ovary, AJCC 7th Edition - Clinical: Stage III (rT3, N0, M0) -  Signed by Heath Lark, MD on 06/09/2019    03/08/2017 Imaging   CT CAP W Contrast 03/08/17 IMPRESSION: Chest Impression:  1. Stable small pulmonary nodules within LEFT lung. 2. No mediastinal lymphadenopathy.  Abdomen / Pelvis Impression:  1. No evidence of metastatic colorectal cancer or ovarian cancer within the abdomen pelvis. 2. No lymphadenopathy. 3. Postsurgical change consistent RIGHT hemicolectomy and hysterectomy 4. A fem-fem arterial bypass noted.   03/12/2017 Mammogram   MM Right Breast 03/12/17 IMPRESSION: No mammographic or sonographic evidence of malignancy. No correlate identified for the abnormal enhancement seen within the right nipple on MRI of 02/27/2017.    04/02/2017 Pathology Results   Surgical Pathology of Nipple Biopsy: 04/02/17 Diagnosis Nipple Biopsy, Right - BENIGN NIPPLE TISSUE. - NO ATYPIA OR MALIGNANCY.    11/13/2017 Imaging   1. No findings of recurrent malignancy in the abdomen or pelvis. 2. Other imaging findings of potential clinical significance: Chronic AVN of both femoral heads without contour abnormality. Lumbar impingement at L4-5 and L5-S1. Aortic Atherosclerosis (ICD10-I70.0). 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 Partial right hemicolectomy. Hysterectomy.   03/22/2018 Mammogram   Screening mammogram IMPRESSION No evidence of malignancy.     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   06/06/2019 Imaging   1. Tiny bilateral pulmonary nodules stable since 11/12/2018. Continued close attention on follow-up recommended. 2. Incompletely visualized upper abdominal lymphadenopathy better characterized on the abdomen CT from 2 weeks ago. 3.  Aortic  Atherosclerois (ICD10-170.0) 4.  Emphysema. (UXN23-F57.9)   06/07/2019 Procedure   Technically successful CT guided core needle biopsy of indeterminate peritoneal nodule within the right lower abdomen/pelvis.   06/07/2019 Pathology Results   Soft Tissue Needle Core Biopsy, peritoneal nodule within right lower abdomen - METASTATIC CARCINOMA CONSISTENT WITH PATIENT'S CLINICAL HISTORY OF PRIMARY OVARIAN CARCINOMA. SEE NOTE Diagnosis Note Immunohistochemical stains show that the tumor cells are positive for CK7, ER and PAX 8; and negative for CK20 and CDX2. This immunostaining profile is consistent with above diagnosis.   06/16/2019 Tumor Marker   Patient's tumor was tested for the following markers: CA-125 Results of the tumor marker test revealed 499   06/20/2019 - 11/14/2019 Chemotherapy   The patient had carboplatin and taxol   07/11/2019 Tumor Marker   Patient's tumor was tested for the following markers: CA-125 Results of the tumor marker test revealed 438   08/05/2019 Tumor Marker   Patient's tumor was tested for the following markers: CA-125 Results of the tumor marker test revealed 272.   08/25/2019 Imaging   Ct chest, abdomen and pelvis 1. Slight interval decrease in size of a right lower quadrant peritoneal nodule measuring 1.7 x 1.6 cm, previously  2.2 x 2.2 cm (series 2, image 94, series 5, image 58). Findings are consistent with treatment response.   2. No significant change in ill-defined soft tissue about the sigmoid colon in the hysterectomy bed (series 2, image 101).   3.  Status post hysterectomy.   4. No evidence of metastatic disease in the chest. Stable benign small pulmonary nodules.   5.  Coronary artery disease.  Aortic Atherosclerosis (ICD10-I70.0).     08/26/2019 Tumor Marker   Patient's tumor was tested for the following markers: CA-125 Results of the tumor marker test revealed 189.   09/26/2019 Tumor Marker   Patient's tumor was tested for the following  markers: CA-125 Results of the tumor marker test revealed 137   10/03/2019 Tumor Marker   Patient's tumor was tested for the following markers: CA-125 Results of the tumor marker test revealed 137.   10/31/2019 Tumor Marker   Patient's tumor was tested for the following markers: CA-125 Results of the tumor marker test revealed 101   11/29/2019 Imaging   1. Right lower quadrant peritoneal implant is decreased. Stable irregular nodular pelvic peritoneal thickening superior to the vaginal cuff. 2. No new or progressive metastatic disease. No abdominopelvic lymphadenopathy. 3.  Aortic Atherosclerosis (ICD10-I70.0).   12/15/2019 -  Chemotherapy   The patient had Lynparza for chemotherapy treatment.     12/23/2019 Tumor Marker   Patient's tumor was tested for the following markers: CA-125 Results of the tumor marker test revealed 55.9   01/02/2020 Tumor Marker   Patient's tumor was tested for the following markers: CA-125. Results of the tumor marker test revealed 58.6   02/03/2020 Tumor Marker   Patient's tumor was tested for the following markers: CA-125 Results of the tumor marker test revealed 52.2   02/24/2020 Tumor Marker   Patient's tumor was tested for the following markers: CA-125 Results of the tumor marker test revealed 64.8   04/19/2020 Imaging   1. Continued slight interval decrease in size of a soft tissue nodule in the right paracolic gutter, consistent with treatment response. 2. Soft tissue in the hysterectomy bed near the distal sigmoid colon and rectum may be slightly decreased in size, difficult to measure due to configuration but apparently diminished in size, also consistent with treatment response. 3. Status post hysterectomy and oophorectomy as well as right colon resection. 4. No evidence of new metastatic disease in the abdomen or pelvis. 5. Redemonstrated long segment occlusion of the left common, internal, and external iliac arteries. Status post femoral-femoral  bypass grafting with reconstitution of the left common femoral artery. Aortic Atherosclerosis (ICD10-I70.0).   08/15/2020 Tumor Marker   Patient's tumor was tested for the following markers: CA-125 Results of the tumor marker test revealed 47.1   10/22/2020 Imaging   1. Reduced size of the nodule along the right paracolic gutter, currently 0.6 by 1.2 cm, previously 0.7 by 1.4 cm on 04/19/2020 and previously 0.9 by 1.5 cm on 11/29/2019. Appearance compatible with treatment response. 2. Further mild reduction in size of the bandlike density along the rectosigmoid mesentery, probably from scarring or effectively treated tumor. 3. Prominent stool throughout the colon favors constipation.  4. Stable chronic occlusion of the left iliac arteries, with a patent fem-fem bypass. 5. 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 6. Lower lumbar spondylosis and degenerative disc disease causing impingement at L4-5 and L5-S1. 7. Aortic atherosclerosis.     10/23/2020 Tumor Marker   Patient's  tumor was tested for the following markers: CA-125 Results of the tumor marker test revealed 74.6   01/21/2021 Tumor Marker   Patient's tumor was tested for the following markers: CA-125 Results of the tumor marker test revealed 825   01/28/2021 Imaging   1. Changes of right hemicolectomy with slightly decreased size of the nodule along the right pericolic gutter and similar band like density along the rectosigmoid mesentery. 2. Two new well-circumscribed low-density nodules in the presacral perirectal space, difficult to ascertain whether they arise from the bowel wall or arise from within the peritoneum. This is a nonspecific finding which could be further evaluated with colonoscopy, repeat CT with rectal contrast or attention on close interval follow-up imaging in 3 months. 3. Stable chronic occlusion of the left iliac arteries with patent  fem-fem bypass. 4. 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 5. Emphysema and aortic atherosclerosis. Aortic Atherosclerosis (ICD10-I70.0) and Emphysema (ICD10-J43.9).   02/08/2021 Procedure   Successful placement of a right internal jugular approach power injectable Port-A-Cath. The catheter is ready for immediate use.     02/11/2021 - 06/06/2021 Chemotherapy   Received carbo/gem and avastin       02/18/2021 Tumor Marker   Patient's tumor was tested for the following markers: CA-125 Results of the tumor marker test revealed 852   03/04/2021 Tumor Marker   Patient's tumor was tested for the following markers: CA-125. Results of the tumor marker test revealed 541   03/11/2021 Tumor Marker   Patient's tumor was tested for the following markers: CA-125 Results of the tumor marker test revealed 568   03/25/2021 Tumor Marker   Patient's tumor was tested for the following markers:CA-125 Results of the tumor marker test revealed 367.   04/08/2021 Tumor Marker   Patient's tumor was tested for the following markers: CA-125 Results of the tumor marker test revealed 273   04/29/2021 Tumor Marker   Patient's tumor was tested for the following markers: CA-125 Results of the tumor marker test revealed 431   06/06/2021 Tumor Marker   Patient's tumor was tested for the following markers: CA-125. Results of the tumor marker test revealed 594.   06/18/2021 Imaging   1. Nodular fascial thickening with subtle increase along the RIGHT pericolic gutter as described. Short interval follow-up may be helpful. 2. Stable appearance of fascial thickening and serosal thickening in the pelvis, potentially indicative of peritoneal disease. 3. Post RIGHT hemicolectomy. 4. Stable chronic occlusion of LEFT iliac vasculature with patent femoral femoral bypass grafting. 5. 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 6. Aortic atherosclerosis.   Aortic Atherosclerosis  (ICD10-I70.0).     07/16/2021 - 09/27/2021 Chemotherapy   Patient is on Treatment Plan : Ovarian Bevacizumab q21d     07/17/2021 Tumor Marker   Patient's tumor was tested for the following markers: CA-125. Results of the tumor marker test revealed 996.   08/06/2021 Tumor Marker   Patient's tumor was tested for the following markers: CA-125. Results of the tumor marker test revealed 975.   10/29/2021 Imaging   1. Unfortunately, there is evidence progression of peritoneal metastasis. Most obvious progression is along the RIGHT pericolic gutter with there is increased peritoneal thickening. 2. Small volume fluid in the pelvis with enhancing peritoneal surface. Peritoneal enhancement is similar; however, the small volume of fluid is increased. 3. Interval increase in size of small periaortic retroperitoneal lymph node.   11/12/2021 -  Chemotherapy   Patient is on Treatment Plan : OVARIAN  Liposomal Doxorubicin q28d     11/12/2021 Echocardiogram   1. Left ventricular ejection fraction, by estimation, is 60 to 65%. The left ventricle has normal function. The left ventricle has no regional wall motion abnormalities. There is mild asymmetric left ventricular hypertrophy of the septal and anterior segments. Left ventricular diastolic parameters were normal.  2. Right ventricular systolic function is normal. The right ventricular size is normal.  3. The mitral valve is normal in structure. Mild mitral valve regurgitation.  4. The aortic valve is normal in structure. Aortic valve regurgitation is mild to moderate. No aortic stenosis is present.       PHYSICAL EXAMINATION: ECOG PERFORMANCE STATUS: 1 - Symptomatic but completely ambulatory  Vitals:   01/09/22 1219  BP: 126/82  Pulse: 92  Resp: 18  Temp: 97.8 F (36.6 C)  SpO2: 100%   Filed Weights   01/09/22 1219  Weight: 131 lb 6.4 oz (59.6 kg)    GENERAL:alert, no distress and comfortable  NEURO: alert & oriented x 3 with fluent  speech, no focal motor/sensory deficits  LABORATORY DATA:  I have reviewed the data as listed    Component Value Date/Time   NA 139 12/10/2021 1212   NA 141 10/07/2017 1436   K 4.3 12/10/2021 1212   K 4.4 10/07/2017 1436   CL 105 12/10/2021 1212   CO2 24 12/10/2021 1212   CO2 26 10/07/2017 1436   GLUCOSE 106 (H) 12/10/2021 1212   GLUCOSE 85 10/07/2017 1436   BUN 16 12/10/2021 1212   BUN 14.9 10/07/2017 1436   CREATININE 0.92 12/10/2021 1212   CREATININE 1.0 10/07/2017 1436   CALCIUM 9.6 12/10/2021 1212   CALCIUM 9.5 10/07/2017 1436   PROT 7.3 12/10/2021 1212   PROT 7.8 10/07/2017 1436   ALBUMIN 4.2 12/10/2021 1212   ALBUMIN 3.9 10/07/2017 1436   AST 14 (L) 12/10/2021 1212   AST 15 10/07/2017 1436   ALT 7 12/10/2021 1212   ALT 7 10/07/2017 1436   ALKPHOS 48 12/10/2021 1212   ALKPHOS 81 10/07/2017 1436   BILITOT 0.4 12/10/2021 1212   BILITOT 0.54 10/07/2017 1436   GFRNONAA >60 12/10/2021 1212   GFRAA >60 08/15/2020 1121    No results found for: SPEP, UPEP  Lab Results  Component Value Date   WBC 3.8 (L) 01/09/2022   NEUTROABS 1.3 (L) 01/09/2022   HGB 10.8 (L) 01/09/2022   HCT 31.2 (L) 01/09/2022   MCV 100.3 (H) 01/09/2022   PLT 134 (L) 01/09/2022      Chemistry      Component Value Date/Time   NA 139 12/10/2021 1212   NA 141 10/07/2017 1436   K 4.3 12/10/2021 1212   K 4.4 10/07/2017 1436   CL 105 12/10/2021 1212   CO2 24 12/10/2021 1212   CO2 26 10/07/2017 1436   BUN 16 12/10/2021 1212   BUN 14.9 10/07/2017 1436   CREATININE 0.92 12/10/2021 1212   CREATININE 1.0 10/07/2017 1436      Component Value Date/Time   CALCIUM 9.6 12/10/2021 1212   CALCIUM 9.5 10/07/2017 1436   ALKPHOS 48 12/10/2021 1212   ALKPHOS 81 10/07/2017 1436   AST 14 (L) 12/10/2021 1212   AST 15 10/07/2017 1436   ALT 7 12/10/2021 1212   ALT 7 10/07/2017 1436   BILITOT 0.4 12/10/2021 1212   BILITOT 0.54 10/07/2017 1436

## 2022-01-09 NOTE — Patient Instructions (Signed)
[facility]  Discharge Instructions: Thank you for choosing [facility] to provide your oncology and hematology care.   If you have a lab appointment with the [facility], please go directly to the [facility] and check in at the registration area.   Wear comfortable clothing and clothing appropriate for easy access to any Portacath or PICC line.   We strive to give you quality time with your provider. You may need to reschedule your appointment if you arrive late (15 or more minutes).  Arriving late affects you and other patients whose appointments are after yours.  Also, if you miss three or more appointments without notifying the office, you may be dismissed from the clinic at the providers discretion.      For prescription refill requests, have your pharmacy contact our office and allow 72 hours for refills to be completed.    Today you received the following chemotherapy and/or immunotherapy agent: Doxorubicin Liposomal (Doxil)    To help prevent nausea and vomiting after your treatment, we encourage you to take your nausea medication as directed.  BELOW ARE SYMPTOMS THAT SHOULD BE REPORTED IMMEDIATELY: *FEVER GREATER THAN 100.4 F (38 C) OR HIGHER *CHILLS OR SWEATING *NAUSEA AND VOMITING THAT IS NOT CONTROLLED WITH YOUR NAUSEA MEDICATION *UNUSUAL SHORTNESS OF BREATH *UNUSUAL BRUISING OR BLEEDING *URINARY PROBLEMS (pain or burning when urinating, or frequent urination) *BOWEL PROBLEMS (unusual diarrhea, constipation, pain near the anus) TENDERNESS IN MOUTH AND THROAT WITH OR WITHOUT PRESENCE OF ULCERS (sore throat, sores in mouth, or a toothache) UNUSUAL RASH, SWELLING OR PAIN  UNUSUAL VAGINAL DISCHARGE OR ITCHING   Items with * indicate a potential emergency and should be followed up as soon as possible or go to the Emergency Department if any problems should occur.  Please show the CHEMOTHERAPY ALERT CARD or IMMUNOTHERAPY ALERT  CARD at check-in to the Emergency Department and triage nurse.  Should you have questions after your visit or need to cancel or reschedule your appointment, please contact [facility]  Dept: [phone]  and follow the prompts.  Office hours are 8:00 a.m. to 4:30 p.m. Monday - Friday. Please note that voicemails left after 4:00 p.m. may not be returned until the following business day.  We are closed weekends and major holidays. You have access to a nurse at all times for urgent questions. Please call the main number to the clinic Dept: [phone] and follow the prompts.   For any non-urgent questions, you may also contact your provider using MyChart. We now offer e-Visits for anyone 43 and older to request care online for non-urgent symptoms. For details visit [email or web address].   Also download the MyChart app! Go to the app store, search "MyChart", open the app, select [facility], and log in with your MyChart username and password.  Due to Covid, a mask is required upon entering the hospital/clinic. If you do not have a mask, one will be given to you upon arrival. For doctor visits, patients may have 1 support person aged 8 or older with them. For treatment visits, patients cannot have anyone with them due to current Covid guidelines and our immunocompromised population.

## 2022-01-09 NOTE — Assessment & Plan Note (Signed)
This is stable Observe closely She does not need dose reduction

## 2022-01-09 NOTE — Assessment & Plan Note (Signed)
She tolerated last cycle of treatment fairly well without major side effects Her pancytopenia is stable She is due for repeat imaging study and I will order it before her next treatment I will see her next month for further follow-up

## 2022-01-13 ENCOUNTER — Telehealth: Payer: Self-pay

## 2022-01-13 NOTE — Telephone Encounter (Signed)
I suggest she wait and see how she feels this week We can keep her appt as is next month and cancel chemo if she decided on no treatment then

## 2022-01-13 NOTE — Telephone Encounter (Signed)
Returned her call. She went to the ED at [facility] yesterday for nausea and vomiting since last treatment on 2/16. Her symptoms started after treatment on 2/16.  She is able to eat and drink today. Denies nausea/ vomiting today. She is concerned and not sure that she want to get this treatment anymore. She thinks that the treatment caused her to have nausea/vomiting. She wants to talk with you regarding future treatment.

## 2022-01-13 NOTE — Telephone Encounter (Signed)
Called and given below message. She verbalized understanding. She will keep appt as scheduled and call the office back for questions/concerns.

## 2022-01-13 NOTE — Telephone Encounter (Signed)
Called and left message asking her to call the office back.

## 2022-01-29 ENCOUNTER — Encounter: Payer: Self-pay | Admitting: Hematology and Oncology

## 2022-01-31 ENCOUNTER — Encounter: Payer: Self-pay | Admitting: Hematology and Oncology

## 2022-01-31 ENCOUNTER — Other Ambulatory Visit: Payer: Self-pay | Admitting: Hematology and Oncology

## 2022-01-31 DIAGNOSIS — C562 Malignant neoplasm of left ovary: Secondary | ICD-10-CM

## 2022-01-31 DIAGNOSIS — Z1501 Genetic susceptibility to malignant neoplasm of breast: Secondary | ICD-10-CM

## 2022-02-03 ENCOUNTER — Other Ambulatory Visit: Payer: Self-pay | Admitting: Hematology and Oncology

## 2022-02-03 ENCOUNTER — Telehealth: Payer: Self-pay

## 2022-02-03 MED ORDER — HYDROCODONE-ACETAMINOPHEN 10-325 MG PO TABS: 1.0000 | ORAL_TABLET | Freq: Four times a day (QID) | ORAL | 0 refills | Status: DC | PRN

## 2022-02-03 NOTE — Telephone Encounter (Signed)
She called and left a message requesting refill of Hydrocodone Rx. ?

## 2022-02-03 NOTE — Telephone Encounter (Signed)
Called and given below message. She verbalized understanding. 

## 2022-02-03 NOTE — Telephone Encounter (Signed)
done

## 2022-02-05 ENCOUNTER — Other Ambulatory Visit: Payer: Self-pay

## 2022-02-05 ENCOUNTER — Encounter (HOSPITAL_COMMUNITY): Payer: Self-pay | Admitting: Radiology

## 2022-02-05 ENCOUNTER — Ambulatory Visit (HOSPITAL_COMMUNITY)
Admission: RE | Admit: 2022-02-05 | Discharge: 2022-02-05 | Disposition: A | Discharge status: Home / Self Care | Payer: 59 | Source: Ambulatory Visit | Attending: Hematology and Oncology | Admitting: Hematology and Oncology

## 2022-02-05 DIAGNOSIS — C562 Malignant neoplasm of left ovary: Secondary | ICD-10-CM | POA: Insufficient documentation

## 2022-02-05 MED ORDER — IOHEXOL 300 MG/ML  SOLN
100.0000 mL | Freq: Once | INTRAMUSCULAR | Status: AC | PRN
  Administered 2022-02-05: 100 mL via INTRAVENOUS

## 2022-02-05 MED FILL — Dexamethasone Sodium Phosphate Inj 100 MG/10ML: INTRAMUSCULAR | Qty: 1 | Status: AC

## 2022-02-06 ENCOUNTER — Inpatient Hospital Stay (HOSPITAL_BASED_OUTPATIENT_CLINIC_OR_DEPARTMENT_OTHER): Payer: 59 | Admitting: Hematology and Oncology

## 2022-02-06 ENCOUNTER — Inpatient Hospital Stay: Payer: 59

## 2022-02-06 ENCOUNTER — Encounter: Payer: Self-pay | Admitting: Hematology and Oncology

## 2022-02-06 ENCOUNTER — Inpatient Hospital Stay: Payer: 59 | Attending: Hematology and Oncology

## 2022-02-06 DIAGNOSIS — Z9079 Acquired absence of other genital organ(s): Secondary | ICD-10-CM | POA: Diagnosis not present

## 2022-02-06 DIAGNOSIS — I10 Essential (primary) hypertension: Secondary | ICD-10-CM | POA: Insufficient documentation

## 2022-02-06 DIAGNOSIS — Z85038 Personal history of other malignant neoplasm of large intestine: Secondary | ICD-10-CM | POA: Diagnosis not present

## 2022-02-06 DIAGNOSIS — D61818 Other pancytopenia: Secondary | ICD-10-CM | POA: Diagnosis not present

## 2022-02-06 DIAGNOSIS — Z7189 Other specified counseling: Secondary | ICD-10-CM | POA: Diagnosis not present

## 2022-02-06 DIAGNOSIS — G893 Neoplasm related pain (acute) (chronic): Secondary | ICD-10-CM

## 2022-02-06 DIAGNOSIS — C562 Malignant neoplasm of left ovary: Secondary | ICD-10-CM

## 2022-02-06 DIAGNOSIS — C786 Secondary malignant neoplasm of retroperitoneum and peritoneum: Secondary | ICD-10-CM | POA: Insufficient documentation

## 2022-02-06 DIAGNOSIS — Z90722 Acquired absence of ovaries, bilateral: Secondary | ICD-10-CM | POA: Insufficient documentation

## 2022-02-06 DIAGNOSIS — Z5111 Encounter for antineoplastic chemotherapy: Secondary | ICD-10-CM | POA: Diagnosis present

## 2022-02-06 DIAGNOSIS — Z9221 Personal history of antineoplastic chemotherapy: Secondary | ICD-10-CM | POA: Diagnosis not present

## 2022-02-06 DIAGNOSIS — Z79899 Other long term (current) drug therapy: Secondary | ICD-10-CM | POA: Diagnosis not present

## 2022-02-06 DIAGNOSIS — Z90710 Acquired absence of both cervix and uterus: Secondary | ICD-10-CM | POA: Insufficient documentation

## 2022-02-06 LAB — CBC WITH DIFFERENTIAL (CANCER CENTER ONLY)
Abs Immature Granulocytes: 0.01 10*3/uL (ref 0.00–0.07)
Basophils Absolute: 0 10*3/uL (ref 0.0–0.1)
Basophils Relative: 0 %
Eosinophils Absolute: 0 10*3/uL (ref 0.0–0.5)
Eosinophils Relative: 0 %
HCT: 26 % — ABNORMAL LOW (ref 36.0–46.0)
Hemoglobin: 8.9 g/dL — ABNORMAL LOW (ref 12.0–15.0)
Immature Granulocytes: 0 %
Lymphocytes Relative: 46 %
Lymphs Abs: 1.7 10*3/uL (ref 0.7–4.0)
MCH: 34.5 pg — ABNORMAL HIGH (ref 26.0–34.0)
MCHC: 34.2 g/dL (ref 30.0–36.0)
MCV: 100.8 fL — ABNORMAL HIGH (ref 80.0–100.0)
Monocytes Absolute: 0.6 10*3/uL (ref 0.1–1.0)
Monocytes Relative: 15 %
Neutro Abs: 1.5 10*3/uL — ABNORMAL LOW (ref 1.7–7.7)
Neutrophils Relative %: 39 %
Platelet Count: 107 10*3/uL — ABNORMAL LOW (ref 150–400)
RBC: 2.58 MIL/uL — ABNORMAL LOW (ref 3.87–5.11)
RDW: 15.9 % — ABNORMAL HIGH (ref 11.5–15.5)
WBC Count: 3.8 10*3/uL — ABNORMAL LOW (ref 4.0–10.5)
nRBC: 0 % (ref 0.0–0.2)

## 2022-02-06 LAB — CMP (CANCER CENTER ONLY)
ALT: 8 U/L (ref 0–44)
AST: 16 U/L (ref 15–41)
Albumin: 3.8 g/dL (ref 3.5–5.0)
Alkaline Phosphatase: 52 U/L (ref 38–126)
Anion gap: 5 (ref 5–15)
BUN: 14 mg/dL (ref 8–23)
CO2: 30 mmol/L (ref 22–32)
Calcium: 9 mg/dL (ref 8.9–10.3)
Chloride: 107 mmol/L (ref 98–111)
Creatinine: 0.9 mg/dL (ref 0.44–1.00)
GFR, Estimated: >60 mL/min (ref >60)
Glucose, Bld: 95 mg/dL (ref 70–99)
Potassium: 4.1 mmol/L (ref 3.5–5.1)
Sodium: 142 mmol/L (ref 135–145)
Total Bilirubin: 0.3 mg/dL (ref 0.3–1.2)
Total Protein: 6.7 g/dL (ref 6.5–8.1)

## 2022-02-06 MED ORDER — DOXORUBICIN HCL LIPOSOMAL CHEMO INJECTION 2 MG/ML
42.0000 mg/m2 | Freq: Once | INTRAVENOUS | Status: AC
  Administered 2022-02-06: 70 mg via INTRAVENOUS
  Filled 2022-02-06: qty 25

## 2022-02-06 MED ORDER — HEPARIN SOD (PORK) LOCK FLUSH 100 UNIT/ML IV SOLN
500.0000 [IU] | Freq: Once | INTRAVENOUS | Status: AC | PRN
  Administered 2022-02-06: 500 [IU]

## 2022-02-06 MED ORDER — DEXTROSE 5 % IV SOLN: Freq: Once | INTRAVENOUS | Status: AC

## 2022-02-06 MED ORDER — SODIUM CHLORIDE 0.9% FLUSH
10.0000 mL | INTRAVENOUS | Status: DC | PRN
  Administered 2022-02-06: 10 mL

## 2022-02-06 MED ORDER — SODIUM CHLORIDE 0.9% FLUSH
10.0000 mL | Freq: Once | INTRAVENOUS | Status: AC
  Administered 2022-02-06: 10 mL

## 2022-02-06 MED ORDER — ONDANSETRON 4 MG PO TBDP: 4.0000 mg | ORAL_TABLET | Freq: Three times a day (TID) | ORAL | 0 refills | Status: DC | PRN

## 2022-02-06 MED ORDER — SODIUM CHLORIDE 0.9 % IV SOLN
10.0000 mg | Freq: Once | INTRAVENOUS | Status: AC
  Administered 2022-02-06: 10 mg via INTRAVENOUS
  Filled 2022-02-06: qty 10

## 2022-02-06 NOTE — Assessment & Plan Note (Signed)
We had numerous discussions about goals of care ?The patient is willing to proceed with treatment as scheduled ?

## 2022-02-06 NOTE — Patient Instructions (Signed)
[facility]  Discharge Instructions: ?Thank you for choosing [facility] to provide your oncology and hematology care.  ? ?If you have a lab appointment with the [facility], please go directly to the [facility] and check in at the registration area. ?  ?Wear comfortable clothing and clothing appropriate for easy access to any Portacath or PICC line.  ? ?We strive to give you quality time with your provider. You may need to reschedule your appointment if you arrive late (15 or more minutes).  Arriving late affects you and other patients whose appointments are after yours.  Also, if you miss three or more appointments without notifying the office, you may be dismissed from the clinic at the provider?s discretion.    ?  ?For prescription refill requests, have your pharmacy contact our office and allow 72 hours for refills to be completed.   ? ?Today you received the following chemotherapy and/or immunotherapy agent: Doxorubicin Liposomal ?  ?To help prevent nausea and vomiting after your treatment, we encourage you to take your nausea medication as directed. ? ?BELOW ARE SYMPTOMS THAT SHOULD BE REPORTED IMMEDIATELY: ?*FEVER GREATER THAN 100.4 F (38 ?C) OR HIGHER ?*CHILLS OR SWEATING ?*NAUSEA AND VOMITING THAT IS NOT CONTROLLED WITH YOUR NAUSEA MEDICATION ?*UNUSUAL SHORTNESS OF BREATH ?*UNUSUAL BRUISING OR BLEEDING ?*URINARY PROBLEMS (pain or burning when urinating, or frequent urination) ?*BOWEL PROBLEMS (unusual diarrhea, constipation, pain near the anus) ?TENDERNESS IN MOUTH AND THROAT WITH OR WITHOUT PRESENCE OF ULCERS (sore throat, sores in mouth, or a toothache) ?UNUSUAL RASH, SWELLING OR PAIN  ?UNUSUAL VAGINAL DISCHARGE OR ITCHING  ? ?Items with * indicate a potential emergency and should be followed up as soon as possible or go to the Emergency Department if any problems should occur. ? ?Please show the CHEMOTHERAPY ALERT CARD or IMMUNOTHERAPY ALERT CARD at  check-in to the Emergency Department and triage nurse. ? ?Should you have questions after your visit or need to cancel or reschedule your appointment, please contact [facility]  Dept: [phone]  and follow the prompts.  Office hours are 8:00 a.m. to 4:30 p.m. Monday - Friday. Please note that voicemails left after 4:00 p.m. may not be returned until the following business day.  We are closed weekends and major holidays. You have access to a nurse at all times for urgent questions. Please call the main number to the clinic Dept: [phone] and follow the prompts. ? ? ?For any non-urgent questions, you may also contact your provider using MyChart. We now offer e-Visits for anyone 2 and older to request care online for non-urgent symptoms. For details visit [email or web address]. ?  ?Also download the MyChart app! Go to the app store, search "MyChart", open the app, select [facility], and log in with your MyChart username and password. ? ?Due to Covid, a mask is required upon entering the hospital/clinic. If you do not have a mask, one will be given to you upon arrival. For doctor visits, patients may have 1 support person aged 69 or older with them. For treatment visits, patients cannot have anyone with them due to current Covid guidelines and our immunocompromised population.  ? ?

## 2022-02-06 NOTE — Assessment & Plan Note (Signed)
This is stable ?Observe closely ?She does not need dose reduction ?

## 2022-02-06 NOTE — Assessment & Plan Note (Signed)
I have reviewed CT imaging with the patient ?Overall, she have stable disease control with her current treatment ?The patient is symptomatic with fatigue and others ?At some point, she was contemplating stopping treatment due to side effects including pancytopenia ?Her blood counts are stable today ?After much discussion, she is willing to proceed with treatment ?I plan to space out her treatment by giving her an additional week of rest to see if this would help with her symptoms and recovery ?She is in agreement ?

## 2022-02-06 NOTE — Progress Notes (Signed)
[facility] ?OFFICE PROGRESS NOTE ? ?Patient Care Team: ?Marrian Salvage, FNP as PCP - General (Internal Medicine) ?Judeth Horn, MD as Consulting Physician (General Surgery) ?Nat Math, MD as Referring Physician (Obstetrics and Gynecology) ?Elam Dutch, MD (Inactive) as Consulting Physician (Vascular Surgery) ?Heath Lark, MD as Consulting Physician (Hematology and Oncology) ? ?ASSESSMENT & PLAN:  ?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 [facility]) ?I have reviewed CT imaging with the patient ?Overall, she have stable disease control with her current treatment ?The patient is symptomatic with fatigue and others ?At some point, she was contemplating stopping treatment due to side effects including pancytopenia ?Her blood counts are stable today ?After much discussion, she is willing to proceed with treatment ?I plan to space out her treatment by giving her an additional week of rest to see if this would help with her symptoms and recovery ?She is in agreement ? ?Pancytopenia, acquired ([facility]) ?This is stable ?Observe closely ?She does not need dose reduction ? ?Cancer associated pain ?She has stable pain control since we resumed chemotherapy ?She will continue current prescribed pain medicine ? ?Goals of care, counseling/discussion ?We had numerous discussions about goals of care ?The patient is willing to proceed with treatment as scheduled ? ?No orders of the defined types were placed in this encounter. ? ? ?All questions were answered. The patient knows to call the clinic with any problems, questions or concerns. ?The total time spent in the appointment was 30 minutes encounter with patients including review of chart and various tests results, discussions about plan of care and coordination of care plan ?  ?Heath Lark, MD ?02/06/2022 1:37 PM ? ?INTERVAL HISTORY: ?Please see below for problem oriented charting. ?she returns for treatment follow-up seen prior to chemotherapy and review CT imaging  results ?She complains of fatigue ?Her chronic pain is stable ?She denies abdominal pain, nausea or changes in bowel habits ?She is tearful today when we discussed the role of continuing chemotherapy ? ?REVIEW OF SYSTEMS:   ?Constitutional: Denies fevers, chills or abnormal weight loss ?Eyes: Denies blurriness of vision ?Ears, nose, mouth, throat, and face: Denies mucositis or sore throat ?Respiratory: Denies cough, dyspnea or wheezes ?Cardiovascular: Denies palpitation, chest discomfort or lower extremity swelling ?Gastrointestinal:  Denies nausea, heartburn or change in bowel habits ?Skin: Denies abnormal skin rashes ?Lymphatics: Denies new lymphadenopathy or easy bruising ?Neurological:Denies numbness, tingling or new weaknesses ?Behavioral/Psych: Mood is stable, no new changes  ?All other systems were reviewed with the patient and are negative. ? ?I have reviewed the past medical history, past surgical history, social history and family history with the patient and they are unchanged from previous note. ? ?ALLERGIES:  is allergic to other, demerol [meperidine], morphine and related, oxycodone, penicillins, strawberry flavor, tylenol [acetaminophen], and ibuprofen. ? ?MEDICATIONS:  ?Current Outpatient Medications  ?Medication Sig Dispense Refill  ? acetaminophen (TYLENOL) 325 MG tablet Take 325 mg by mouth as needed.    ? atenolol (TENORMIN) 50 MG tablet Take 1 tablet (50 mg total) by mouth daily. 30 tablet 3  ? docusate sodium 100 MG CAPS Take 100 mg by mouth 2 (two) times daily as needed for mild constipation. 10 capsule 0  ? doxazosin (CARDURA) 2 MG tablet Take 1 tablet (2 mg total) by mouth daily. 30 tablet 3  ? HYDROcodone-acetaminophen (NORCO) 10-325 MG tablet Take 1 tablet by mouth every 6 (six) hours as needed. 90 tablet 0  ? lidocaine (LIDODERM) 5 % APPLY ONE PATCH TO SKIN DAILY. REMOVE AND DISCARD  WITHIN 12 HOURS OR AS DIRECTED BY DR 30 patch 2  ? lidocaine-prilocaine (EMLA) cream APPLY TO THE AFFECTED  AREA(S) TOPICALLY AS NEEDED 30 g 2  ? losartan (COZAAR) 100 MG tablet Take 1 tablet (100 mg total) by mouth daily. 30 tablet 3  ? LYRICA 200 MG capsule TAKE ONE CAPSULE BY MOUTH EVERY 8 HOURS. PLEASE GIVE PATIENT BRAND NAME LYRICA 90 capsule 4  ? ondansetron (ZOFRAN-ODT) 4 MG disintegrating tablet Take 1 tablet (4 mg total) by mouth every 8 (eight) hours as needed. 90 tablet 0  ? prochlorperazine (COMPAZINE) 10 MG tablet Take 1 tablet (10 mg total) by mouth every 6 (six) hours as needed (Nausea or vomiting). 30 tablet 1  ? ?No current facility-administered medications for this visit.  ? ?Facility-Administered Medications Ordered in Other Visits  ?Medication Dose Route Frequency Provider Last Rate Last Admin  ? DOXOrubicin HCL LIPOSOMAL (DOXIL) 70 mg in dextrose 5 % 250 mL chemo infusion  42 mg/m2 (Treatment Plan Recorded) Intravenous Once Alvy Bimler, Delores Edelstein, MD      ? heparin lock flush 100 unit/mL  500 Units Intracatheter Once PRN Alvy Bimler, Jams Trickett, MD      ? sodium chloride flush (NS) 0.9 % injection 10 mL  10 mL Intracatheter PRN Heath Lark, MD      ? ? 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  ?Cancer of right colon [facility])  ?09/16/2014 Imaging  ? CT abd/pel:  a 3.8cm cecal mass with associated ileocecal intussusception, and a 9.1cm solid and cystic mass in the left pelvis, ?  ?09/18/2014 Surgery  ? right hemicolectomy and terminal ileoectomy ?  ?09/18/2014 Pathologic Stage  ? pT3pN1Mx, tumor extends into pericolonic soft tissue and is less than 17m from the serosal surface, LVI 8-), PNI (-), all 23 node negative, one soft tissue tumor deposit, surgical margins negative.  ?  ?09/18/2014 Initial Diagnosis  ? Adenocarcinoma of right colon ?  ?09/18/2014 Cancer Staging  ? Staging form: Colon and Rectum, AJCC 7th Edition ?- Clinical stage from 09/18/2014: Stage IIIB (T3, N1c, M0) - Signed by GHeath Lark MD on 02/06/2022 ?Laterality: Right ?Histologic  grade (G): G2 ?Residual tumor (R): R0 - None ?Tumor deposits (TD): Present ?Perineural invasion (PNI): Absent ?Microsatellite instability (MSI): Stable ? ?  ?08/06/2016 Imaging  ? CT chest/abd/pelvis with contrast ?IMPRESSION: ?1. No recurrent malignancy identified. ?2. 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 On the prior exam from 09/13/2015 there was back flow of contrast in the left external iliac artery to supply the left internal iliac artery ; this back flow of contrast is no longer present in the left external iliac artery is occluded. ?3. Several pulmonary nodules at or below 4 mm diameter, unchanged. ?4. 3 mm hypodensity posteriorly in the body of the pancreas, not well seen previously and subtle today, likely incidental, but merits observation. ?5. 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 Prominent degenerative spurring of both acetabula. ?6. Lumbar spondylosis and degenerative disc disease causing prominent impingement at L4-5 and mild impingement at L5-S1. ?  ?03/08/2017 Imaging  ? CT CAP W Contrast 03/08/17 ?IMPRESSION: ?Chest Impression: ?1. Stable small pulmonary nodules within LEFT lung. ?2. No mediastinal lymphadenopathy. ? ?Abdomen / Pelvis Impression: ?1. No evidence of metastatic colorectal cancer or ovarian cancer within the abdomen pelvis. ?2. No lymphadenopathy. ?3. Postsurgical change consistent RIGHT hemicolectomy and hysterectomy ?4. A fem-fem  arterial bypass noted. ?  ?11/13/2017 Imaging  ? CT AP w contrast ?IMPRESSION: ?1. No findings of recurrent malignancy in the abdomen or pelvis. ?2. Other imaging findings of potential clinical significance:Chronic AVN of both femoral heads without contour abnormality. Lumbar impingement at L4-5 and L5-S1. Aortic Atherosclerosis (ICD10-I70.0). 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 Partial right hemicolectomy. Hysterectomy. ?  ?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 [facility])  ?10/09/2014 Imaging  ? CT of the abdomen and pelvis showed a 9.1 cm solid and cystic mass in the left pelvis concerning for malig

## 2022-02-06 NOTE — Assessment & Plan Note (Signed)
She has stable pain control since we resumed chemotherapy ?She will continue current prescribed pain medicine ?

## 2022-02-20 ENCOUNTER — Other Ambulatory Visit: Payer: Self-pay | Admitting: Hematology and Oncology

## 2022-02-27 ENCOUNTER — Telehealth: Payer: Self-pay

## 2022-02-27 ENCOUNTER — Other Ambulatory Visit: Payer: Self-pay | Admitting: Hematology and Oncology

## 2022-02-27 MED ORDER — HYDROCODONE-ACETAMINOPHEN 10-325 MG PO TABS: 1.0000 | ORAL_TABLET | Freq: Four times a day (QID) | ORAL | 0 refills | Status: DC | PRN

## 2022-02-27 NOTE — Telephone Encounter (Signed)
She called and left a message requesting Hydrocodone refill sent to pharmacy. ?

## 2022-02-27 NOTE — Telephone Encounter (Signed)
done

## 2022-02-27 NOTE — Telephone Encounter (Signed)
Called and given be message. She verbalized understanding. ?

## 2022-03-13 ENCOUNTER — Inpatient Hospital Stay: Payer: 59

## 2022-03-13 ENCOUNTER — Encounter: Payer: Self-pay | Admitting: Hematology and Oncology

## 2022-03-13 ENCOUNTER — Inpatient Hospital Stay: Payer: 59 | Attending: Hematology and Oncology

## 2022-03-13 ENCOUNTER — Inpatient Hospital Stay (HOSPITAL_BASED_OUTPATIENT_CLINIC_OR_DEPARTMENT_OTHER): Payer: 59 | Admitting: Hematology and Oncology

## 2022-03-13 ENCOUNTER — Telehealth: Payer: Self-pay

## 2022-03-13 ENCOUNTER — Other Ambulatory Visit: Payer: Self-pay

## 2022-03-13 VITALS — BP 123/80 | HR 85 | Temp 98.0°F | Wt 124.5 lb

## 2022-03-13 DIAGNOSIS — C562 Malignant neoplasm of left ovary: Secondary | ICD-10-CM

## 2022-03-13 DIAGNOSIS — Z85038 Personal history of other malignant neoplasm of large intestine: Secondary | ICD-10-CM | POA: Insufficient documentation

## 2022-03-13 DIAGNOSIS — Z90722 Acquired absence of ovaries, bilateral: Secondary | ICD-10-CM | POA: Diagnosis not present

## 2022-03-13 DIAGNOSIS — C786 Secondary malignant neoplasm of retroperitoneum and peritoneum: Secondary | ICD-10-CM | POA: Diagnosis present

## 2022-03-13 DIAGNOSIS — D61818 Other pancytopenia: Secondary | ICD-10-CM

## 2022-03-13 DIAGNOSIS — G893 Neoplasm related pain (acute) (chronic): Secondary | ICD-10-CM | POA: Insufficient documentation

## 2022-03-13 DIAGNOSIS — I10 Essential (primary) hypertension: Secondary | ICD-10-CM

## 2022-03-13 DIAGNOSIS — Z90710 Acquired absence of both cervix and uterus: Secondary | ICD-10-CM | POA: Diagnosis not present

## 2022-03-13 DIAGNOSIS — R634 Abnormal weight loss: Secondary | ICD-10-CM | POA: Insufficient documentation

## 2022-03-13 DIAGNOSIS — Z9079 Acquired absence of other genital organ(s): Secondary | ICD-10-CM | POA: Diagnosis not present

## 2022-03-13 DIAGNOSIS — Z9221 Personal history of antineoplastic chemotherapy: Secondary | ICD-10-CM | POA: Diagnosis not present

## 2022-03-13 DIAGNOSIS — Z79899 Other long term (current) drug therapy: Secondary | ICD-10-CM | POA: Insufficient documentation

## 2022-03-13 DIAGNOSIS — Z5111 Encounter for antineoplastic chemotherapy: Secondary | ICD-10-CM | POA: Diagnosis not present

## 2022-03-13 LAB — CMP (CANCER CENTER ONLY)
ALT: <5 U/L (ref 0–44)
AST: 11 U/L — ABNORMAL LOW (ref 15–41)
Albumin: 3.9 g/dL (ref 3.5–5.0)
Alkaline Phosphatase: 55 U/L (ref 38–126)
Anion gap: 6 (ref 5–15)
BUN: 21 mg/dL (ref 8–23)
CO2: 28 mmol/L (ref 22–32)
Calcium: 9.2 mg/dL (ref 8.9–10.3)
Chloride: 106 mmol/L (ref 98–111)
Creatinine: 1 mg/dL (ref 0.44–1.00)
GFR, Estimated: >60 mL/min (ref >60)
Glucose, Bld: 112 mg/dL — ABNORMAL HIGH (ref 70–99)
Potassium: 4.2 mmol/L (ref 3.5–5.1)
Sodium: 140 mmol/L (ref 135–145)
Total Bilirubin: 0.3 mg/dL (ref 0.3–1.2)
Total Protein: 7.1 g/dL (ref 6.5–8.1)

## 2022-03-13 LAB — CBC WITH DIFFERENTIAL (CANCER CENTER ONLY)
Abs Immature Granulocytes: 0 10*3/uL (ref 0.00–0.07)
Basophils Absolute: 0 10*3/uL (ref 0.0–0.1)
Basophils Relative: 1 %
Eosinophils Absolute: 0 10*3/uL (ref 0.0–0.5)
Eosinophils Relative: 1 %
HCT: 24.3 % — ABNORMAL LOW (ref 36.0–46.0)
Hemoglobin: 8.4 g/dL — ABNORMAL LOW (ref 12.0–15.0)
Immature Granulocytes: 0 %
Lymphocytes Relative: 41 %
Lymphs Abs: 1.8 10*3/uL (ref 0.7–4.0)
MCH: 35.3 pg — ABNORMAL HIGH (ref 26.0–34.0)
MCHC: 34.6 g/dL (ref 30.0–36.0)
MCV: 102.1 fL — ABNORMAL HIGH (ref 80.0–100.0)
Monocytes Absolute: 0.6 10*3/uL (ref 0.1–1.0)
Monocytes Relative: 14 %
Neutro Abs: 1.9 10*3/uL (ref 1.7–7.7)
Neutrophils Relative %: 43 %
Platelet Count: 174 10*3/uL (ref 150–400)
RBC: 2.38 MIL/uL — ABNORMAL LOW (ref 3.87–5.11)
RDW: 15.7 % — ABNORMAL HIGH (ref 11.5–15.5)
Smear Review: NORMAL
WBC Count: 4.3 10*3/uL (ref 4.0–10.5)
nRBC: 0 % (ref 0.0–0.2)

## 2022-03-13 MED ORDER — DEXTROSE 5 % IV SOLN: Freq: Once | INTRAVENOUS | Status: AC

## 2022-03-13 MED ORDER — SODIUM CHLORIDE 0.9 % IV SOLN
10.0000 mg | Freq: Once | INTRAVENOUS | Status: AC
  Administered 2022-03-13: 10 mg via INTRAVENOUS
  Filled 2022-03-13: qty 10

## 2022-03-13 MED ORDER — SODIUM CHLORIDE 0.9% FLUSH
10.0000 mL | INTRAVENOUS | Status: DC | PRN
  Administered 2022-03-13: 10 mL

## 2022-03-13 MED ORDER — SODIUM CHLORIDE 0.9% FLUSH
10.0000 mL | Freq: Once | INTRAVENOUS | Status: AC
  Administered 2022-03-13: 10 mL

## 2022-03-13 MED ORDER — ONDANSETRON HCL 4 MG/2ML IJ SOLN
8.0000 mg | Freq: Once | INTRAMUSCULAR | Status: AC
  Administered 2022-03-13: 8 mg via INTRAVENOUS
  Filled 2022-03-13: qty 4

## 2022-03-13 MED ORDER — DOXORUBICIN HCL LIPOSOMAL CHEMO INJECTION 2 MG/ML
42.5000 mg/m2 | Freq: Once | INTRAVENOUS | Status: AC
  Administered 2022-03-13: 70 mg via INTRAVENOUS
  Filled 2022-03-13: qty 10

## 2022-03-13 MED ORDER — SODIUM CHLORIDE 0.9 % IV SOLN: 8.0000 mg | Freq: Once | INTRAVENOUS | Status: DC

## 2022-03-13 MED ORDER — HEPARIN SOD (PORK) LOCK FLUSH 100 UNIT/ML IV SOLN
500.0000 [IU] | Freq: Once | INTRAVENOUS | Status: AC | PRN
  Administered 2022-03-13: 500 [IU]

## 2022-03-13 NOTE — Progress Notes (Signed)
Patient c/o nausea. VO for zofran received and administered. Patient stated that she felt better prior to DC. ?

## 2022-03-13 NOTE — Telephone Encounter (Signed)
Called and left a message Echo scheduled 5/4 at 1 pm at [facility], arrive at 1245. ?

## 2022-03-13 NOTE — Progress Notes (Signed)
[facility] ?OFFICE PROGRESS NOTE ? ?Patient Care Team: ?Marrian Salvage, FNP as PCP - General (Internal Medicine) ?Judeth Horn, MD as Consulting Physician (General Surgery) ?Nat Math, MD as Referring Physician (Obstetrics and Gynecology) ?Elam Dutch, MD (Inactive) as Consulting Physician (Vascular Surgery) ?Heath Lark, MD as Consulting Physician (Hematology and Oncology) ? ?ASSESSMENT & PLAN:  ?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 [facility]) ?Overall, she tolerated reduced dose Doxil at intervals of every 5 weeks better ?We will continue at current dose ?I plan to repeat echocardiogram next month for further follow-up ?We will repeat CT imaging in June for further follow-up ? ?Cancer associated pain ?She has stable pain control since we resumed chemotherapy ?She will continue current prescribed pain medicine ? ?Pancytopenia, acquired ([place]) ?This is stable ?Observe closely ?She does not need transfusion support ?We will space out her treatment every 5 weeks since she tolerated this change in plan better ? ?Weight loss, abnormal ?She has unintentional weight loss due to stress at home ?I will reduce the dose of her chemotherapy based on her current weight ?I recommend frequent small meals ? ?Orders Placed This Encounter  ?Procedures  ? ECHOCARDIOGRAM COMPLETE  ?  Standing Status:   Future  ?  Standing Expiration Date:   03/14/2023  ?  Order Specific Question:   Where should this test be performed  ?  Answer:   [facility]  ?  Order Specific Question:   Perflutren DEFINITY (image enhancing agent) should be administered unless hypersensitivity or allergy exist  ?  Answer:   Administer Perflutren  ?  Order Specific Question:   Reason for exam-Echo  ?  Answer:   Chemo  Z09  ? ? ?All questions were answered. The patient knows to call the clinic with any problems, questions or concerns. ?The total time spent in the appointment was 30 minutes encounter with patients including review of chart and various  tests results, discussions about plan of care and coordination of care plan ?  ?Heath Lark, MD ?03/13/2022 1:49 PM ? ?INTERVAL HISTORY: ?Please see below for problem oriented charting. ?she returns for treatment follow-up on Doxil ?She denies mucositis, hand-foot syndrome, signs or symptoms of congestive heart failure such as chest pain or shortness of breath ?Her chronic abdominal pain and leg pain are stable ?No recent infection ?The patient denies any recent signs or symptoms of bleeding such as spontaneous epistaxis, hematuria or hematochezia. ? ? ?REVIEW OF SYSTEMS:   ?Constitutional: Denies fevers, chills or abnormal weight loss ?Eyes: Denies blurriness of vision ?Ears, nose, mouth, throat, and face: Denies mucositis or sore throat ?Respiratory: Denies cough, dyspnea or wheezes ?Cardiovascular: Denies palpitation, chest discomfort or lower extremity swelling ?Gastrointestinal:  Denies nausea, heartburn or change in bowel habits ?Skin: Denies abnormal skin rashes ?Lymphatics: Denies new lymphadenopathy or easy bruising ?Neurological:Denies numbness, tingling or new weaknesses ?Behavioral/Psych: Mood is stable, no new changes  ?All other systems were reviewed with the patient and are negative. ? ?I have reviewed the past medical history, past surgical history, social history and family history with the patient and they are unchanged from previous note. ? ?ALLERGIES:  is allergic to other, demerol [meperidine], morphine and related, oxycodone, penicillins, strawberry flavor, tylenol [acetaminophen], and ibuprofen. ? ?MEDICATIONS:  ?Current Outpatient Medications  ?Medication Sig Dispense Refill  ? acetaminophen (TYLENOL) 325 MG tablet Take 325 mg by mouth as needed.    ? docusate sodium 100 MG CAPS Take 100 mg by mouth 2 (two) times daily as needed for  mild constipation. 10 capsule 0  ? HYDROcodone-acetaminophen (NORCO) 10-325 MG tablet Take 1 tablet by mouth every 6 (six) hours as needed. 90 tablet 0  ? lidocaine  (LIDODERM) 5 % APPLY ONE PATCH TO SKIN DAILY. REMOVE AND DISCARD WITHIN 12 HOURS OR AS DIRECTED BY DR 30 patch 2  ? lidocaine-prilocaine (EMLA) cream APPLY TO THE AFFECTED AREA(S) TOPICALLY AS NEEDED 30 g 2  ? losartan (COZAAR) 100 MG tablet TAKE 1 TABLET BY MOUTH EVERY DAY 30 tablet 3  ? LYRICA 200 MG capsule TAKE ONE CAPSULE BY MOUTH EVERY 8 HOURS. PLEASE GIVE PATIENT BRAND NAME LYRICA 90 capsule 4  ? ondansetron (ZOFRAN-ODT) 4 MG disintegrating tablet Take 1 tablet (4 mg total) by mouth every 8 (eight) hours as needed. 90 tablet 0  ? prochlorperazine (COMPAZINE) 10 MG tablet Take 1 tablet (10 mg total) by mouth every 6 (six) hours as needed (Nausea or vomiting). 30 tablet 1  ? ?No current facility-administered medications for this visit.  ? ?Facility-Administered Medications Ordered in Other Visits  ?Medication Dose Route Frequency Provider Last Rate Last Admin  ? dexamethasone (DECADRON) 10 mg in sodium chloride 0.9 % 50 mL IVPB  10 mg Intravenous Once Heath Lark, MD 204 mL/hr at 03/13/22 1336 10 mg at 03/13/22 1336  ? DOXOrubicin HCL LIPOSOMAL (DOXIL) 70 mg in dextrose 5 % 250 mL chemo infusion  42.5 mg/m2 (Treatment Plan Recorded) Intravenous Once Alvy Bimler, Jessamine Barcia, MD      ? heparin lock flush 100 unit/mL  500 Units Intracatheter Once PRN Alvy Bimler, Lorian Yaun, MD      ? sodium chloride flush (NS) 0.9 % injection 10 mL  10 mL Intracatheter PRN Heath Lark, MD      ? ? 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  ?Cancer of right colon [facility])  ?09/16/2014 Imaging  ? CT abd/pel:  a 3.8cm cecal mass with associated ileocecal intussusception, and a 9.1cm solid and cystic mass in the left pelvis, ? ?  ?09/18/2014 Surgery  ? right hemicolectomy and terminal ileoectomy ? ?  ?09/18/2014 Pathologic Stage  ? pT3pN1Mx, tumor extends into pericolonic soft tissue and is less than 81m from the serosal surface, LVI 8-), PNI (-), all 23 node negative, one soft tissue  tumor deposit, surgical margins negative.  ? ?  ?09/18/2014 Initial Diagnosis  ? Adenocarcinoma of right colon ? ?  ?09/18/2014 Cancer Staging  ? Staging form: Colon and Rectum, AJCC 7th Edition ?- Clinical stage from 09/18/2014: Stage IIIB (T3, N1c, M0) - Signed by GHeath Lark MD on 02/06/2022 ?Laterality: Right ?Histologic grade (G): G2 ?Residual tumor (R): R0 - None ?Tumor deposits (TD): Present ?Perineural invasion (PNI): Absent ?Microsatellite instability (MSI): Stable ? ?  ?08/06/2016 Imaging  ? CT chest/abd/pelvis with contrast ?IMPRESSION: ?1. No recurrent malignancy identified. ?2. 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 On the prior exam from 09/13/2015 there was back flow of contrast in the left external iliac artery to supply the left internal iliac artery ; this back flow of contrast is no longer present in the left external iliac artery is occluded. ?3. Several pulmonary nodules at or below 4 mm diameter, unchanged. ?4. 3 mm hypodensity posteriorly in the body of the pancreas, not well seen previously and subtle today, likely incidental, but merits observation. ?5. 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 Prominent degenerative spurring of both acetabula. ?6.  Lumbar spondylosis and degenerative disc disease causing prominent impingement at L4-5 and mild impingement at L5-S1. ?  ?03/08/2017 Imaging  ? CT CAP W Contrast 03/08/17 ?IMPRESSION: ?Chest Impression: ?1. Stable small pulmonary nodules within LEFT lung. ?2. No mediastinal lymphadenopathy. ? ?Abdomen / Pelvis Impression: ?1. No evidence of metastatic colorectal cancer or ovarian cancer within the abdomen pelvis. ?2. No lymphadenopathy. ?3. Postsurgical change consistent RIGHT hemicolectomy and hysterectomy ?4. A fem-fem arterial bypass noted. ?  ?11/13/2017 Imaging  ? CT AP w contrast ?IMPRESSION: ?1. No findings of recurrent malignancy in the abdomen or  pelvis. ?2. Other imaging findings of potential clinical significance:Chronic AVN of both femoral heads without contour abnormality. Lumbar impingement at L4-5 and L5-S1. Aortic Atherosclerosis (ICD10-I70.

## 2022-03-13 NOTE — Telephone Encounter (Signed)
-----   Message from Heath Lark, MD sent at 03/13/2022 12:32 PM EDT ----- ?Pls schedule echo in 1-2 weeks ? ?

## 2022-03-13 NOTE — Assessment & Plan Note (Signed)
Overall, she tolerated reduced dose Doxil at intervals of every 5 weeks better ?We will continue at current dose ?I plan to repeat echocardiogram next month for further follow-up ?We will repeat CT imaging in June for further follow-up ?

## 2022-03-13 NOTE — Assessment & Plan Note (Signed)
This is stable ?Observe closely ?She does not need transfusion support ?We will space out her treatment every 5 weeks since she tolerated this change in plan better ?

## 2022-03-13 NOTE — Patient Instructions (Signed)
[facility]  Discharge Instructions: ?Thank you for choosing [facility] to provide your oncology and hematology care.  ? ?If you have a lab appointment with the [facility], please go directly to the [facility] and check in at the registration area. ?  ?Wear comfortable clothing and clothing appropriate for easy access to any Portacath or PICC line.  ? ?We strive to give you quality time with your provider. You may need to reschedule your appointment if you arrive late (15 or more minutes).  Arriving late affects you and other patients whose appointments are after yours.  Also, if you miss three or more appointments without notifying the office, you may be dismissed from the clinic at the provider?s discretion.    ?  ?For prescription refill requests, have your pharmacy contact our office and allow 72 hours for refills to be completed.   ? ?Today you received the following chemotherapy and/or immunotherapy agents : Doxil    ?  ?To help prevent nausea and vomiting after your treatment, we encourage you to take your nausea medication as directed. ? ?BELOW ARE SYMPTOMS THAT SHOULD BE REPORTED IMMEDIATELY: ?*FEVER GREATER THAN 100.4 F (38 ?C) OR HIGHER ?*CHILLS OR SWEATING ?*NAUSEA AND VOMITING THAT IS NOT CONTROLLED WITH YOUR NAUSEA MEDICATION ?*UNUSUAL SHORTNESS OF BREATH ?*UNUSUAL BRUISING OR BLEEDING ?*URINARY PROBLEMS (pain or burning when urinating, or frequent urination) ?*BOWEL PROBLEMS (unusual diarrhea, constipation, pain near the anus) ?TENDERNESS IN MOUTH AND THROAT WITH OR WITHOUT PRESENCE OF ULCERS (sore throat, sores in mouth, or a toothache) ?UNUSUAL RASH, SWELLING OR PAIN  ?UNUSUAL VAGINAL DISCHARGE OR ITCHING  ? ?Items with * indicate a potential emergency and should be followed up as soon as possible or go to the Emergency Department if any problems should occur. ? ?Please show the CHEMOTHERAPY ALERT CARD or IMMUNOTHERAPY ALERT CARD at check-in to the  Emergency Department and triage nurse. ? ?Should you have questions after your visit or need to cancel or reschedule your appointment, please contact [facility]  Dept: [phone]  and follow the prompts.  Office hours are 8:00 a.m. to 4:30 p.m. Monday - Friday. Please note that voicemails left after 4:00 p.m. may not be returned until the following business day.  We are closed weekends and major holidays. You have access to a nurse at all times for urgent questions. Please call the main number to the clinic Dept: [phone] and follow the prompts. ? ? ?For any non-urgent questions, you may also contact your provider using MyChart. We now offer e-Visits for anyone 31 and older to request care online for non-urgent symptoms. For details visit [email or web address]. ?  ?Also download the MyChart app! Go to the app store, search "MyChart", open the app, select [facility], and log in with your MyChart username and password. ? ?Due to Covid, a mask is required upon entering the hospital/clinic. If you do not have a mask, one will be given to you upon arrival. For doctor visits, patients may have 1 support person aged 71 or older with them. For treatment visits, patients cannot have anyone with them due to current Covid guidelines and our immunocompromised population.  ? ?

## 2022-03-13 NOTE — Assessment & Plan Note (Signed)
She has stable pain control since we resumed chemotherapy ?She will continue current prescribed pain medicine ?

## 2022-03-13 NOTE — Assessment & Plan Note (Signed)
She has unintentional weight loss due to stress at home ?I will reduce the dose of her chemotherapy based on her current weight ?I recommend frequent small meals ?

## 2022-03-27 ENCOUNTER — Other Ambulatory Visit (HOSPITAL_COMMUNITY): Payer: 59

## 2022-03-28 ENCOUNTER — Other Ambulatory Visit: Payer: Self-pay | Admitting: Hematology and Oncology

## 2022-03-28 ENCOUNTER — Telehealth: Payer: Self-pay

## 2022-03-28 MED ORDER — HYDROCODONE-ACETAMINOPHEN 10-325 MG PO TABS: 1.0000 | ORAL_TABLET | Freq: Four times a day (QID) | ORAL | 0 refills | Status: DC | PRN

## 2022-03-28 NOTE — Telephone Encounter (Signed)
Called and left below message. Ask her to call the office back for questions. 

## 2022-03-28 NOTE — Telephone Encounter (Signed)
She called and left a message asking for Hydrocodone Rx refill. ?

## 2022-03-28 NOTE — Telephone Encounter (Signed)
done

## 2022-04-03 ENCOUNTER — Inpatient Hospital Stay (HOSPITAL_COMMUNITY): Admission: RE | Admit: 2022-04-03 | Payer: 59 | Source: Ambulatory Visit

## 2022-04-09 ENCOUNTER — Ambulatory Visit (HOSPITAL_COMMUNITY)
Admission: RE | Admit: 2022-04-09 | Discharge: 2022-04-09 | Disposition: A | Discharge status: Home / Self Care | Payer: 59 | Source: Ambulatory Visit | Attending: Hematology and Oncology | Admitting: Hematology and Oncology

## 2022-04-09 DIAGNOSIS — I080 Rheumatic disorders of both mitral and aortic valves: Secondary | ICD-10-CM | POA: Insufficient documentation

## 2022-04-09 DIAGNOSIS — Z0189 Encounter for other specified special examinations: Secondary | ICD-10-CM

## 2022-04-09 DIAGNOSIS — I10 Essential (primary) hypertension: Secondary | ICD-10-CM

## 2022-04-09 DIAGNOSIS — C562 Malignant neoplasm of left ovary: Secondary | ICD-10-CM | POA: Diagnosis not present

## 2022-04-09 DIAGNOSIS — I119 Hypertensive heart disease without heart failure: Secondary | ICD-10-CM | POA: Diagnosis present

## 2022-04-09 LAB — ECHOCARDIOGRAM COMPLETE
AR max vel: 2.14 cm2
AV Peak grad: 8.1 mmHg
Ao pk vel: 1.42 m/s
Area-P 1/2: 5.5 cm2
Calc EF: 49.2 %
MV M vel: 5.08 m/s
MV Peak grad: 103.1 mmHg
P 1/2 time: 543 msec
S' Lateral: 3.4 cm
Single Plane A2C EF: 49.2 %
Single Plane A4C EF: 47.9 %

## 2022-04-16 MED FILL — Dexamethasone Sodium Phosphate Inj 100 MG/10ML: INTRAMUSCULAR | Qty: 1 | Status: AC

## 2022-04-17 ENCOUNTER — Other Ambulatory Visit: Payer: Self-pay | Admitting: Hematology and Oncology

## 2022-04-17 ENCOUNTER — Inpatient Hospital Stay: Payer: 59

## 2022-04-17 ENCOUNTER — Inpatient Hospital Stay (HOSPITAL_BASED_OUTPATIENT_CLINIC_OR_DEPARTMENT_OTHER): Payer: 59 | Admitting: Hematology and Oncology

## 2022-04-17 ENCOUNTER — Other Ambulatory Visit: Payer: Self-pay

## 2022-04-17 ENCOUNTER — Encounter: Payer: Self-pay | Admitting: Hematology and Oncology

## 2022-04-17 ENCOUNTER — Inpatient Hospital Stay: Payer: 59 | Attending: Hematology and Oncology

## 2022-04-17 VITALS — BP 135/77 | HR 79 | Temp 97.9°F | Resp 18 | Ht 67.0 in | Wt 131.6 lb

## 2022-04-17 DIAGNOSIS — G893 Neoplasm related pain (acute) (chronic): Secondary | ICD-10-CM | POA: Insufficient documentation

## 2022-04-17 DIAGNOSIS — Z90722 Acquired absence of ovaries, bilateral: Secondary | ICD-10-CM | POA: Diagnosis not present

## 2022-04-17 DIAGNOSIS — D61818 Other pancytopenia: Secondary | ICD-10-CM | POA: Insufficient documentation

## 2022-04-17 DIAGNOSIS — R634 Abnormal weight loss: Secondary | ICD-10-CM | POA: Diagnosis not present

## 2022-04-17 DIAGNOSIS — Z90710 Acquired absence of both cervix and uterus: Secondary | ICD-10-CM | POA: Insufficient documentation

## 2022-04-17 DIAGNOSIS — C562 Malignant neoplasm of left ovary: Secondary | ICD-10-CM

## 2022-04-17 DIAGNOSIS — I10 Essential (primary) hypertension: Secondary | ICD-10-CM | POA: Diagnosis not present

## 2022-04-17 DIAGNOSIS — Z9079 Acquired absence of other genital organ(s): Secondary | ICD-10-CM | POA: Insufficient documentation

## 2022-04-17 DIAGNOSIS — Z5111 Encounter for antineoplastic chemotherapy: Secondary | ICD-10-CM | POA: Insufficient documentation

## 2022-04-17 DIAGNOSIS — Z79899 Other long term (current) drug therapy: Secondary | ICD-10-CM | POA: Insufficient documentation

## 2022-04-17 DIAGNOSIS — Z85038 Personal history of other malignant neoplasm of large intestine: Secondary | ICD-10-CM | POA: Diagnosis not present

## 2022-04-17 DIAGNOSIS — C786 Secondary malignant neoplasm of retroperitoneum and peritoneum: Secondary | ICD-10-CM | POA: Diagnosis present

## 2022-04-17 DIAGNOSIS — Z9221 Personal history of antineoplastic chemotherapy: Secondary | ICD-10-CM | POA: Diagnosis not present

## 2022-04-17 LAB — CBC WITH DIFFERENTIAL (CANCER CENTER ONLY)
Abs Immature Granulocytes: 0 10*3/uL (ref 0.00–0.07)
Basophils Absolute: 0 10*3/uL (ref 0.0–0.1)
Basophils Relative: 1 %
Eosinophils Absolute: 0 10*3/uL (ref 0.0–0.5)
Eosinophils Relative: 1 %
HCT: 27 % — ABNORMAL LOW (ref 36.0–46.0)
Hemoglobin: 9.1 g/dL — ABNORMAL LOW (ref 12.0–15.0)
Immature Granulocytes: 0 %
Lymphocytes Relative: 49 %
Lymphs Abs: 2.1 10*3/uL (ref 0.7–4.0)
MCH: 35 pg — ABNORMAL HIGH (ref 26.0–34.0)
MCHC: 33.7 g/dL (ref 30.0–36.0)
MCV: 103.8 fL — ABNORMAL HIGH (ref 80.0–100.0)
Monocytes Absolute: 0.4 10*3/uL (ref 0.1–1.0)
Monocytes Relative: 10 %
Neutro Abs: 1.6 10*3/uL — ABNORMAL LOW (ref 1.7–7.7)
Neutrophils Relative %: 39 %
Platelet Count: 108 10*3/uL — ABNORMAL LOW (ref 150–400)
RBC: 2.6 MIL/uL — ABNORMAL LOW (ref 3.87–5.11)
RDW: 16.1 % — ABNORMAL HIGH (ref 11.5–15.5)
Smear Review: NORMAL
WBC Count: 4.3 10*3/uL (ref 4.0–10.5)
nRBC: 0 % (ref 0.0–0.2)

## 2022-04-17 LAB — CMP (CANCER CENTER ONLY)
ALT: <5 U/L (ref 0–44)
AST: 11 U/L — ABNORMAL LOW (ref 15–41)
Albumin: 3.9 g/dL (ref 3.5–5.0)
Alkaline Phosphatase: 56 U/L (ref 38–126)
Anion gap: 5 (ref 5–15)
BUN: 15 mg/dL (ref 8–23)
CO2: 29 mmol/L (ref 22–32)
Calcium: 8.8 mg/dL — ABNORMAL LOW (ref 8.9–10.3)
Chloride: 104 mmol/L (ref 98–111)
Creatinine: 0.84 mg/dL (ref 0.44–1.00)
GFR, Estimated: >60 mL/min (ref >60)
Glucose, Bld: 102 mg/dL — ABNORMAL HIGH (ref 70–99)
Potassium: 3.8 mmol/L (ref 3.5–5.1)
Sodium: 138 mmol/L (ref 135–145)
Total Bilirubin: 0.4 mg/dL (ref 0.3–1.2)
Total Protein: 6.6 g/dL (ref 6.5–8.1)

## 2022-04-17 MED ORDER — DOXORUBICIN HCL LIPOSOMAL CHEMO INJECTION 2 MG/ML
42.5000 mg/m2 | Freq: Once | INTRAVENOUS | Status: AC
  Administered 2022-04-17: 70 mg via INTRAVENOUS
  Filled 2022-04-17: qty 25

## 2022-04-17 MED ORDER — DEXTROSE 5 % IV SOLN: Freq: Once | INTRAVENOUS | Status: AC

## 2022-04-17 MED ORDER — SODIUM CHLORIDE 0.9% FLUSH
10.0000 mL | Freq: Once | INTRAVENOUS | Status: AC
  Administered 2022-04-17: 10 mL

## 2022-04-17 MED ORDER — SODIUM CHLORIDE 0.9% FLUSH
10.0000 mL | INTRAVENOUS | Status: DC | PRN
  Administered 2022-04-17: 10 mL

## 2022-04-17 MED ORDER — SODIUM CHLORIDE 0.9 % IV SOLN
10.0000 mg | Freq: Once | INTRAVENOUS | Status: AC
  Administered 2022-04-17: 10 mg via INTRAVENOUS
  Filled 2022-04-17: qty 10

## 2022-04-17 MED ORDER — HEPARIN SOD (PORK) LOCK FLUSH 100 UNIT/ML IV SOLN
500.0000 [IU] | Freq: Once | INTRAVENOUS | Status: AC | PRN
  Administered 2022-04-17: 500 [IU]

## 2022-04-17 NOTE — Assessment & Plan Note (Signed)
This is stable Observe closely She does not need transfusion support We will space out her treatment every 5 weeks since she tolerated this change in plan better

## 2022-04-17 NOTE — Progress Notes (Signed)
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 ([place]) Overall, she tolerated reduced dose Doxil at intervals of every 5 weeks better We will continue at current dose Recent repeat echocardiogram she is satisfactory We will repeat CT imaging after her treatment in June for further follow-up  Cancer associated pain She has stable pain control since we resumed chemotherapy She will continue current prescribed pain medicine  Pancytopenia, acquired ([place]) This is stable Observe closely She does not need transfusion support We will space out her treatment every 5 weeks since she tolerated this change in plan better  Orders Placed This Encounter  Procedures   CA 125    Standing Status:   Standing    Number of Occurrences:   11    Standing Expiration Date:   04/18/2023    All questions were answered. The patient knows to call the clinic with any problems, questions or concerns. The total time spent in the appointment was 20 minutes encounter with patients including review of chart and various tests results, discussions about plan of care and coordination of care plan   Heath Lark, MD 04/17/2022 2:44 PM  INTERVAL HISTORY: Please see below for problem oriented charting. she returns for treatment follow-up seen prior to chemotherapy with liposomal doxorubicin She continues to have stable chronic pain Denies recent nausea or changes in bowel habits No recent infection  REVIEW OF SYSTEMS:   Constitutional: Denies fevers, chills or abnormal weight loss Eyes: Denies blurriness of vision Ears, nose,  mouth, throat, and face: Denies mucositis or sore throat Respiratory: Denies cough, dyspnea or wheezes Cardiovascular: Denies palpitation, chest discomfort or lower extremity swelling Gastrointestinal:  Denies nausea, heartburn or change in bowel habits Skin: Denies abnormal skin rashes Lymphatics: Denies new lymphadenopathy or easy bruising Neurological:Denies numbness, tingling or new weaknesses Behavioral/Psych: Mood is stable, no new changes  All other systems were reviewed with the patient and are negative.  I have reviewed the past medical history, past surgical history, social history and family history with the patient and they are unchanged from previous note.  ALLERGIES:  is allergic to other, demerol [meperidine], morphine and related, oxycodone, penicillins, strawberry flavor, tylenol [acetaminophen], and ibuprofen.  MEDICATIONS:  Current Outpatient Medications  Medication Sig Dispense Refill   acetaminophen (TYLENOL) 325 MG tablet Take 325 mg by mouth as needed.     docusate sodium 100 MG CAPS Take 100 mg by mouth 2 (two) times daily as needed for mild constipation. 10 capsule 0   HYDROcodone-acetaminophen (NORCO) 10-325 MG tablet Take 1 tablet by mouth every 6 (six) hours as needed. 90 tablet 0   lidocaine (LIDODERM) 5 % APPLY ONE PATCH TO SKIN DAILY. REMOVE AND DISCARD WITHIN 12 HOURS OR AS DIRECTED BY DR 30 patch 2   lidocaine-prilocaine (EMLA) cream APPLY TO THE AFFECTED AREA(S) TOPICALLY AS NEEDED 30 g 2   losartan (COZAAR) 100 MG tablet TAKE 1 TABLET BY MOUTH EVERY DAY 30 tablet 3   LYRICA 200 MG capsule TAKE ONE CAPSULE BY MOUTH EVERY 8 HOURS. PLEASE GIVE PATIENT BRAND NAME LYRICA 90 capsule 4   ondansetron (ZOFRAN-ODT) 4 MG disintegrating tablet TAKE 1 TABLET BY MOUTH EVERY  8 HOURS AS NEEDED 90 tablet 0   prochlorperazine (COMPAZINE) 10 MG tablet Take 1 tablet (10 mg total) by mouth every 6 (six) hours as needed (Nausea or vomiting). 30 tablet 1   No current  facility-administered medications for this visit.   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     09/18/2014 Initial Diagnosis   Adenocarcinoma of right colon    09/18/2014 Cancer Staging   Staging form: Colon and Rectum, AJCC 7th Edition - Clinical stage from 09/18/2014: Stage IIIB (T3, N1c, M0) - Signed by GHeath Lark MD on 02/06/2022 Laterality: Right Histologic grade (G): G2 Residual tumor (R): R0 - None Tumor deposits (TD): Present Perineural invasion (PNI): Absent Microsatellite instability (MSI): Stable    08/06/2016 Imaging   CT chest/abd/pelvis with contrast IMPRESSION: 1. No recurrent malignancy identified. 2. 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 On the prior exam from 09/13/2015 there was back flow of contrast in the left external iliac artery to  supply the left internal iliac artery ; this back flow of contrast is no longer present in the left external iliac artery is occluded. 3. Several pulmonary nodules at or below 4 mm diameter, unchanged. 4. 3 mm hypodensity posteriorly in the body of the pancreas, not well seen previously and subtle today, likely incidental, but merits observation. 5. 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 Prominent degenerative spurring of both acetabula. 6. Lumbar spondylosis and degenerative disc disease causing prominent impingement at L4-5 and mild impingement at L5-S1.   03/08/2017 Imaging   CT CAP W Contrast 03/08/17 IMPRESSION: Chest Impression: 1. Stable small pulmonary nodules within LEFT lung. 2. No mediastinal lymphadenopathy.  Abdomen / Pelvis Impression: 1. No evidence of metastatic colorectal cancer or ovarian cancer within the abdomen pelvis. 2. No lymphadenopathy. 3. Postsurgical change consistent RIGHT hemicolectomy and hysterectomy 4. A fem-fem arterial bypass noted.   11/13/2017 Imaging   CT AP w contrast IMPRESSION: 1. No findings of recurrent malignancy in the abdomen or pelvis. 2. Other imaging findings of potential clinical significance:Chronic AVN of both femoral heads without contour abnormality. Lumbar impingement at L4-5 and L5-S1. Aortic Atherosclerosis (ICD10-I70.0). 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 Partial right hemicolectomy. Hysterectomy.   Ovarian cancer  on left ([place])  10/09/2014 Imaging   CT of the abdomen and pelvis showed a 9.1 cm solid and cystic mass in the left pelvis concerning for malignancy. This was discovered during her workup for colon cancer.    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 Tumor size 3.7 cm, no lymph nodes removed. T1cNx. Tumor  cells are positive for cytokeratin 7 and estrogen receptor.    11/01/2014 Surgery   B/l Salpingo-oophorectomy.   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 Stable small pulmonary nodules within LEFT lung. 2. No mediastinal lymphadenopathy.  Abdomen / Pelvis Impression:  1. No evidence of metastatic colorectal cancer or ovarian cancer within the abdomen pelvis. 2. No lymphadenopathy. 3. Postsurgical change consistent RIGHT hemicolectomy and hysterectomy 4. A fem-fem arterial bypass noted.   03/12/2017 Mammogram   MM Right Breast 03/12/17 IMPRESSION: No mammographic or sonographic evidence of malignancy. No correlate identified for the abnormal enhancement seen within the right nipple on MRI of 02/27/2017.    04/02/2017 Pathology Results   Surgical Pathology of Nipple Biopsy: 04/02/17 Diagnosis Nipple Biopsy, Right - BENIGN NIPPLE TISSUE. - NO ATYPIA OR MALIGNANCY.    11/13/2017 Imaging   1. No findings of recurrent malignancy in the abdomen or pelvis. 2. Other imaging findings of potential clinical significance: Chronic AVN of both femoral heads without contour abnormality. Lumbar impingement at L4-5 and L5-S1. Aortic Atherosclerosis (ICD10-I70.0). 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 Partial right hemicolectomy. Hysterectomy.   03/22/2018 Mammogram   Screening mammogram IMPRESSION No evidence of malignancy.      05/02/2019 Tumor Marker   Patient's tumor was tested for the following markers: CA-125 Results of the tumor marker test revealed 360   05/24/2019 Imaging   CT abdomen and pelvis New peritoneal carcinomatosis in the pelvis and right  lower quadrant.   06/06/2019 Imaging   1. Tiny bilateral pulmonary nodules stable since 11/12/2018. Continued close attention on follow-up recommended. 2. Incompletely visualized upper abdominal lymphadenopathy better characterized on the abdomen CT from 2 weeks ago. 3.  Aortic Atherosclerois (ICD10-170.0) 4.  Emphysema. ([id number]-A21.9)   06/07/2019 Procedure   Technically successful CT guided core needle biopsy of indeterminate peritoneal nodule within the right lower abdomen/pelvis.   06/07/2019 Pathology Results   Soft Tissue Needle Core Biopsy, peritoneal nodule within right lower abdomen - METASTATIC CARCINOMA CONSISTENT WITH PATIENT'S CLINICAL HISTORY OF PRIMARY OVARIAN CARCINOMA. SEE NOTE Diagnosis Note Immunohistochemical stains show that the tumor cells are positive for CK7, ER and PAX 8; and negative for CK20 and CDX2. This immunostaining profile is consistent with above diagnosis.   06/16/2019 Tumor Marker   Patient's tumor was tested for the following markers: CA-125 Results of the tumor marker test revealed 499   06/20/2019 - 11/14/2019 Chemotherapy   The patient had carboplatin and taxol   07/11/2019 Tumor Marker   Patient's tumor was tested for the following markers: CA-125 Results of the tumor marker test revealed 438   08/05/2019 Tumor Marker   Patient's tumor was tested for the following markers: CA-125 Results of the tumor marker  test revealed 272.   08/25/2019 Imaging   Ct chest, abdomen and pelvis 1. Slight interval decrease in size of a right lower quadrant peritoneal nodule measuring 1.7 x 1.6 cm, previously 2.2 x 2.2 cm (series 2, image 94, series 5, image 58). Findings are consistent with treatment response.   2. No significant change in ill-defined soft tissue about the sigmoid colon in the hysterectomy bed (series 2, image 101).   3.  Status post hysterectomy.   4. No evidence of metastatic disease in the chest. Stable benign small pulmonary nodules.   5.   Coronary artery disease.  Aortic Atherosclerosis (ICD10-I70.0).     08/26/2019 Tumor Marker   Patient's tumor was tested for the following markers: CA-125 Results of the tumor marker test revealed 189.   09/26/2019 Tumor Marker   Patient's tumor was tested for the following markers: CA-125 Results of the tumor marker test revealed 137   10/03/2019 Tumor Marker   Patient's tumor was tested for the following markers: CA-125 Results of the tumor marker test revealed 137.   10/31/2019 Tumor Marker   Patient's tumor was tested for the following markers: CA-125 Results of the tumor marker test revealed 101   11/29/2019 Imaging   1. Right lower quadrant peritoneal implant is decreased. Stable irregular nodular pelvic peritoneal thickening superior to the vaginal cuff. 2. No new or progressive metastatic disease. No abdominopelvic lymphadenopathy. 3.  Aortic Atherosclerosis (ICD10-I70.0).   12/15/2019 -  Chemotherapy   The patient had Lynparza for chemotherapy treatment.     12/23/2019 Tumor Marker   Patient's tumor was tested for the following markers: CA-125 Results of the tumor marker test revealed 55.9   01/02/2020 Tumor Marker   Patient's tumor was tested for the following markers: CA-125. Results of the tumor marker test revealed 58.6   02/03/2020 Tumor Marker   Patient's tumor was tested for the following markers: CA-125 Results of the tumor marker test revealed 52.2   02/24/2020 Tumor Marker   Patient's tumor was tested for the following markers: CA-125 Results of the tumor marker test revealed 64.8   04/19/2020 Imaging   1. Continued slight interval decrease in size of a soft tissue nodule in the right paracolic gutter, consistent with treatment response. 2. Soft tissue in the hysterectomy bed near the distal sigmoid colon and rectum may be slightly decreased in size, difficult to measure due to configuration but apparently diminished in size, also consistent with treatment  response. 3. Status post hysterectomy and oophorectomy as well as right colon resection. 4. No evidence of new metastatic disease in the abdomen or pelvis. 5. Redemonstrated long segment occlusion of the left common, internal, and external iliac arteries. Status post femoral-femoral bypass grafting with reconstitution of the left common femoral artery. Aortic Atherosclerosis (ICD10-I70.0).   08/15/2020 Tumor Marker   Patient's tumor was tested for the following markers: CA-125 Results of the tumor marker test revealed 47.1   10/22/2020 Imaging   1. Reduced size of the nodule along the right paracolic gutter, currently 0.6 by 1.2 cm, previously 0.7 by 1.4 cm on 04/19/2020 and previously 0.9 by 1.5 cm on 11/29/2019. Appearance compatible with treatment response. 2. Further mild reduction in size of the bandlike density along the rectosigmoid mesentery, probably from scarring or effectively treated tumor. 3. Prominent stool throughout the colon favors constipation.  4. Stable chronic occlusion of the left iliac arteries, with a patent fem-fem bypass. 5. 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 6. Lower lumbar spondylosis and degenerative disc disease causing impingement at L4-5 and L5-S1. 7. Aortic atherosclerosis.     10/23/2020 Tumor Marker   Patient's tumor was tested for the following markers: CA-125 Results of the tumor marker test revealed 74.6   01/21/2021 Tumor Marker   Patient's tumor was tested for the following markers: CA-125 Results of the tumor marker test revealed 825   01/28/2021 Imaging   1. Changes of right hemicolectomy with slightly decreased size of the nodule along the right pericolic gutter and similar band like density along the rectosigmoid mesentery. 2. Two new well-circumscribed low-density nodules in the presacral perirectal space, difficult to ascertain whether they arise from the bowel  wall or arise from within the peritoneum. This is a nonspecific finding which could be further evaluated with colonoscopy, repeat CT with rectal contrast or attention on close interval follow-up imaging in 3 months. 3. 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 4. 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 5. Emphysema and aortic atherosclerosis. Aortic Atherosclerosis (ICD10-I70.0) and Emphysema (ICD10-J43.9).   02/08/2021 Procedure   Successful placement of a right internal jugular approach power injectable Port-A-Cath. The catheter is ready for immediate use.     02/11/2021 - 06/06/2021 Chemotherapy   Received carbo/gem and avastin       02/18/2021 Tumor Marker   Patient's tumor was tested for the following markers: CA-125 Results of the tumor marker test revealed 852   03/04/2021 Tumor Marker   Patient's tumor was tested for the following markers: CA-125. Results of the tumor marker test revealed 541   03/11/2021 Tumor Marker   Patient's tumor was tested for the following markers: CA-125 Results of the tumor marker test revealed 568   03/25/2021 Tumor Marker   Patient's tumor was tested for the following markers:CA-125 Results of the tumor marker test revealed 367.   04/08/2021 Tumor Marker   Patient's tumor was tested for the following markers: CA-125 Results of the tumor marker test revealed 273   04/29/2021 Tumor Marker   Patient's tumor was tested for the following markers: CA-125 Results of the tumor marker test revealed 431   06/06/2021 Tumor Marker   Patient's tumor was tested for the following markers: CA-125. Results of the tumor marker test revealed 594.   06/18/2021 Imaging   1. Nodular fascial thickening with subtle increase along the RIGHT pericolic gutter as described. Short interval follow-up may be helpful. 2. Stable appearance of fascial thickening and serosal thickening in the pelvis, potentially  indicative of peritoneal disease. 3. Post RIGHT hemicolectomy. 4. Stable chronic occlusion of LEFT iliac vasculature with patent femoral femoral bypass grafting. 5. 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 6. Aortic atherosclerosis.   Aortic Atherosclerosis (ICD10-I70.0).     07/16/2021 - 09/27/2021 Chemotherapy   Patient is on Treatment Plan : Ovarian Bevacizumab q21d      07/17/2021 Tumor Marker   Patient's tumor was tested for the following markers: CA-125. Results of the tumor marker test revealed 996.   08/06/2021 Tumor Marker   Patient's tumor was tested for the following markers: CA-125. Results of the tumor marker test revealed 975.   10/29/2021 Imaging   1. Unfortunately, there is evidence progression of peritoneal metastasis. Most obvious progression is along the RIGHT pericolic gutter with there is increased peritoneal thickening. 2. Small volume fluid in the pelvis with enhancing peritoneal surface. Peritoneal enhancement is similar;  however, the small volume of fluid is increased. 3. Interval increase in size of small periaortic retroperitoneal lymph node.   11/12/2021 -  Chemotherapy   Patient is on Treatment Plan : OVARIAN Liposomal Doxorubicin q28d      11/12/2021 Echocardiogram   1. Left ventricular ejection fraction, by estimation, is 60 to 65%. The left ventricle has normal function. The left ventricle has no regional wall motion abnormalities. There is mild asymmetric left ventricular hypertrophy of the septal and anterior segments. Left ventricular diastolic parameters were normal.  2. Right ventricular systolic function is normal. The right ventricular size is normal.  3. The mitral valve is normal in structure. Mild mitral valve regurgitation.  4. The aortic valve is normal in structure. Aortic valve regurgitation is mild to moderate. No aortic stenosis is present.     02/06/2022 Imaging   1. Unchanged peritoneal thickening and  nodularity, most conspicuously in the right paracolic gutter, although also with extensive peritoneal thickening and nodularity in the low pelvis. Trace malignant ascites in the low pelvis. 2. Unchanged enlarged left external iliac lymph node. 3. Findings are consistent with stable metastatic disease. No evidence of new metastatic disease in the abdomen or pelvis. 4. Status post hysterectomy, oophorectomy, and omentectomy. 5. Chronic, long segment occlusion of the left common iliac, external iliac, and internal iliac arteries from the origin. Status post femoral femoral bypass graft.   04/10/2022 Echocardiogram    1. Left ventricular ejection fraction, by estimation, is 55 to 60%. The left ventricle has normal function. The left ventricle has no regional wall motion abnormalities. There is mild concentric left ventricular hypertrophy. Left ventricular diastolic parameters were normal. The average left ventricular global longitudinal strain is -13.2 %. The global longitudinal strain is abnormal.  2. Right ventricular systolic function is normal. The right ventricular size is normal. There is normal pulmonary artery systolic pressure. The estimated right ventricular systolic pressure is 09.3 mmHg.  3. The mitral valve is grossly normal. Mild mitral valve regurgitation. No evidence of mitral stenosis.  4. The aortic valve is tricuspid. Aortic valve regurgitation is mild. No aortic stenosis is present.  5. The inferior vena cava is normal in size with greater than 50% respiratory variability, suggesting right atrial pressure of 3 mmHg.     PHYSICAL EXAMINATION: ECOG PERFORMANCE STATUS: 1 - Symptomatic but completely ambulatory  Vitals:   04/17/22 1159  BP: 135/77  Pulse: 79  Resp: 18  Temp: 97.9 F (36.6 C)  SpO2: 99%   Filed Weights   04/17/22 1159  Weight: 131 lb 9.6 oz (59.7 kg)    GENERAL:alert, no distress and comfortable NEURO: alert & oriented x 3 with fluent speech, no focal  motor/sensory deficits  LABORATORY DATA:  I have reviewed the data as listed    Component Value Date/Time   NA 138 04/17/2022 1138   NA 141 10/07/2017 1436   K 3.8 04/17/2022 1138   K 4.4 10/07/2017 1436   CL 104 04/17/2022 1138   CO2 29 04/17/2022 1138   CO2 26 10/07/2017 1436   GLUCOSE 102 (H) 04/17/2022 1138   GLUCOSE 85 10/07/2017 1436   BUN 15 04/17/2022 1138   BUN 14.9 10/07/2017 1436   CREATININE 0.84 04/17/2022 1138   CREATININE 1.0 10/07/2017 1436   CALCIUM 8.8 (L) 04/17/2022 1138   CALCIUM 9.5 10/07/2017 1436   PROT 6.6 04/17/2022 1138   PROT 7.8 10/07/2017 1436   ALBUMIN 3.9 04/17/2022 1138   ALBUMIN 3.9 10/07/2017 1436  AST 11 (L) 04/17/2022 1138   AST 15 10/07/2017 1436   ALT <5 04/17/2022 1138   ALT 7 10/07/2017 1436   ALKPHOS 56 04/17/2022 1138   ALKPHOS 81 10/07/2017 1436   BILITOT 0.4 04/17/2022 1138   BILITOT 0.54 10/07/2017 1436   GFRNONAA >60 04/17/2022 1138   GFRAA >60 08/15/2020 1121    No results found for: SPEP, UPEP  Lab Results  Component Value Date   WBC 4.3 04/17/2022   NEUTROABS 1.6 (L) 04/17/2022   HGB 9.1 (L) 04/17/2022   HCT 27.0 (L) 04/17/2022   MCV 103.8 (H) 04/17/2022   PLT 108 (L) 04/17/2022      Chemistry      Component Value Date/Time   NA 138 04/17/2022 1138   NA 141 10/07/2017 1436   K 3.8 04/17/2022 1138   K 4.4 10/07/2017 1436   CL 104 04/17/2022 1138   CO2 29 04/17/2022 1138   CO2 26 10/07/2017 1436   BUN 15 04/17/2022 1138   BUN 14.9 10/07/2017 1436   CREATININE 0.84 04/17/2022 1138   CREATININE 1.0 10/07/2017 1436      Component Value Date/Time   CALCIUM 8.8 (L) 04/17/2022 1138   CALCIUM 9.5 10/07/2017 1436   ALKPHOS 56 04/17/2022 1138   ALKPHOS 81 10/07/2017 1436   AST 11 (L) 04/17/2022 1138   AST 15 10/07/2017 1436   ALT <5 04/17/2022 1138   ALT 7 10/07/2017 1436   BILITOT 0.4 04/17/2022 1138   BILITOT 0.54 10/07/2017 1436       RADIOGRAPHIC STUDIES: I have personally reviewed the  radiological images as listed and agreed with the findings in the report. ECHOCARDIOGRAM COMPLETE  Result Date: 04/09/2022    ECHOCARDIOGRAM REPORT   Patient Name:   Catherine Rogers Date of Exam: 04/09/2022 Medical Rec #:  [id number]       Height:       67.0 in Accession #:    [id number]      Weight:       124.5 lb Date of Birth:  [birth year]       BSA:          1.653 m Patient Age:    61 years        BP:           123/80 mmHg Patient Gender: F               HR:           92 bpm. Exam Location:  Outpatient Procedure: 2D Echo, Cardiac Doppler, Color Doppler and Strain Analysis Indications:    Chemo  History:        Patient has prior history of Echocardiogram examinations. Risk                 Factors:Hypertension.  Sonographer:    Jyl Heinz Referring Phys: [id number] Curby Carswell [place]  1. Left ventricular ejection fraction, by estimation, is 55 to 60%. The left ventricle has normal function. The left ventricle has no regional wall motion abnormalities. There is mild concentric left ventricular hypertrophy. Left ventricular diastolic parameters were normal. The average left ventricular global longitudinal strain is -13.2 %. The global longitudinal strain is abnormal.  2. Right ventricular systolic function is normal. The right ventricular size is normal. There is normal pulmonary artery systolic pressure. The estimated right ventricular systolic pressure is 33.2 mmHg.  3. The mitral valve is grossly normal. Mild mitral valve regurgitation. No evidence of mitral stenosis.  4.  The aortic valve is tricuspid. Aortic valve regurgitation is mild. No aortic stenosis is present.  5. The inferior vena cava is normal in size with greater than 50% respiratory variability, suggesting right atrial pressure of 3 mmHg. Comparison(s): Changes from prior study are noted. EF unchanged. Strain abnormal on this study. FINDINGS  Left Ventricle: Left ventricular ejection fraction, by estimation, is 55 to 60%. The left ventricle has  normal function. The left ventricle has no regional wall motion abnormalities. The average left ventricular global longitudinal strain is -13.2 %. The global longitudinal strain is abnormal. 3D left ventricular ejection fraction analysis performed but not reported based on interpreter judgement due to suboptimal tracking. The left ventricular internal cavity size was normal in size. There is mild concentric left ventricular hypertrophy. Left ventricular diastolic parameters were normal. Right Ventricle: The right ventricular size is normal. No increase in right ventricular wall thickness. Right ventricular systolic function is normal. There is normal pulmonary artery systolic pressure. The tricuspid regurgitant velocity is 1.97 m/s, and  with an assumed right atrial pressure of 3 mmHg, the estimated right ventricular systolic pressure is 40.8 mmHg. Left Atrium: Left atrial size was normal in size. Right Atrium: Right atrial size was normal in size. Pericardium: Trivial pericardial effusion is present. Mitral Valve: The mitral valve is grossly normal. Mild mitral valve regurgitation. No evidence of mitral valve stenosis. Tricuspid Valve: The tricuspid valve is grossly normal. Tricuspid valve regurgitation is trivial. No evidence of tricuspid stenosis. Aortic Valve: The aortic valve is tricuspid. Aortic valve regurgitation is mild. Aortic regurgitation PHT measures 543 msec. No aortic stenosis is present. Aortic valve peak gradient measures 8.1 mmHg. Pulmonic Valve: The pulmonic valve was grossly normal. Pulmonic valve regurgitation is not visualized. No evidence of pulmonic stenosis. Aorta: The aortic root and ascending aorta are structurally normal, with no evidence of dilitation. Venous: The inferior vena cava is normal in size with greater than 50% respiratory variability, suggesting right atrial pressure of 3 mmHg. IAS/Shunts: The atrial septum is grossly normal.  LEFT VENTRICLE PLAX 2D LVIDd:         4.60 cm       Diastology LVIDs:         3.40 cm      LV e' medial:    4.46 cm/s LV PW:         1.10 cm      LV E/e' medial:  16.3 LV IVS:        1.30 cm      LV e' lateral:   7.18 cm/s LVOT diam:     2.00 cm      LV E/e' lateral: 10.1 LV SV:         56 LV SV Index:   34           2D Longitudinal Strain LVOT Area:     3.14 cm     2D Strain GLS Avg:     -13.2 %  LV Volumes (MOD) LV vol d, MOD A2C: 120.0 ml 3D Volume EF: LV vol d, MOD A4C: 104.0 ml 3D EF:        47 % LV vol s, MOD A2C: 61.0 ml  LV EDV:       161 ml LV vol s, MOD A4C: 54.2 ml  LV ESV:       85 ml LV SV MOD A2C:     59.0 ml  LV SV:        76 ml LV SV  MOD A4C:     104.0 ml LV SV MOD BP:      56.4 ml RIGHT VENTRICLE             IVC RV Basal diam:  2.90 cm     IVC diam: 1.30 cm RV Mid diam:    2.50 cm RV S prime:     11.50 cm/s TAPSE (M-mode): 2.3 cm LEFT ATRIUM             Index        RIGHT ATRIUM           Index LA diam:        3.40 cm 2.06 cm/m   RA Area:     10.90 cm LA Vol (A2C):   41.0 ml 24.80 ml/m  RA Volume:   25.70 ml  15.54 ml/m LA Vol (A4C):   41.5 ml 25.10 ml/m LA Biplane Vol: 41.4 ml 25.04 ml/m  AORTIC VALVE AV Area (Vmax): 2.14 cm AV Vmax:        142.00 cm/s AV Peak Grad:   8.1 mmHg LVOT Vmax:      96.70 cm/s LVOT Vmean:     68.700 cm/s LVOT VTI:       0.177 m AI PHT:         543 msec  AORTA Ao Root diam: 2.90 cm Ao Asc diam:  3.00 cm MITRAL VALVE                TRICUSPID VALVE MV Area (PHT): 5.50 cm     TR Peak grad:   15.5 mmHg MV Decel Time: 138 msec     TR Vmax:        197.00 cm/s MR Peak grad: 103.1 mmHg MR Vmax:      507.67 cm/s   SHUNTS MV E velocity: 72.70 cm/s   Systemic VTI:  0.18 m MV A velocity: 113.00 cm/s  Systemic Diam: 2.00 cm MV E/A ratio:  0.64 Eleonore Chiquito MD Electronically signed by Eleonore Chiquito MD Signature Date/Time: 04/09/2022/4:32:00 PM    Final

## 2022-04-17 NOTE — Patient Instructions (Signed)
[facility]  Discharge Instructions: Thank you for choosing [facility] to provide your oncology and hematology care.   If you have a lab appointment with the [facility], please go directly to the [facility] and check in at the registration area.   Wear comfortable clothing and clothing appropriate for easy access to any Portacath or PICC line.   We strive to give you quality time with your provider. You may need to reschedule your appointment if you arrive late (15 or more minutes).  Arriving late affects you and other patients whose appointments are after yours.  Also, if you miss three or more appointments without notifying the office, you may be dismissed from the clinic at the provider's discretion.      For prescription refill requests, have your pharmacy contact our office and allow 72 hours for refills to be completed.    Today you received the following chemotherapy and/or immunotherapy agent: Doxorubicin Liposomal   To help prevent nausea and vomiting after your treatment, we encourage you to take your nausea medication as directed.  BELOW ARE SYMPTOMS THAT SHOULD BE REPORTED IMMEDIATELY: *FEVER GREATER THAN 100.4 F (38 C) OR HIGHER *CHILLS OR SWEATING *NAUSEA AND VOMITING THAT IS NOT CONTROLLED WITH YOUR NAUSEA MEDICATION *UNUSUAL SHORTNESS OF BREATH *UNUSUAL BRUISING OR BLEEDING *URINARY PROBLEMS (pain or burning when urinating, or frequent urination) *BOWEL PROBLEMS (unusual diarrhea, constipation, pain near the anus) TENDERNESS IN MOUTH AND THROAT WITH OR WITHOUT PRESENCE OF ULCERS (sore throat, sores in mouth, or a toothache) UNUSUAL RASH, SWELLING OR PAIN  UNUSUAL VAGINAL DISCHARGE OR ITCHING   Items with * indicate a potential emergency and should be followed up as soon as possible or go to the Emergency Department if any problems should occur.  Please show the CHEMOTHERAPY ALERT CARD or IMMUNOTHERAPY ALERT CARD at  check-in to the Emergency Department and triage nurse.  Should you have questions after your visit or need to cancel or reschedule your appointment, please contact [facility]  Dept: [phone]  and follow the prompts.  Office hours are 8:00 a.m. to 4:30 p.m. Monday - Friday. Please note that voicemails left after 4:00 p.m. may not be returned until the following business day.  We are closed weekends and major holidays. You have access to a nurse at all times for urgent questions. Please call the main number to the clinic Dept: [phone] and follow the prompts.   For any non-urgent questions, you may also contact your provider using MyChart. We now offer e-Visits for anyone 29 and older to request care online for non-urgent symptoms. For details visit [email or web address].   Also download the MyChart app! Go to the app store, search "MyChart", open the app, select [facility], and log in with your MyChart username and password.  Due to Covid, a mask is required upon entering the hospital/clinic. If you do not have a mask, one will be given to you upon arrival. For doctor visits, patients may have 1 support person aged 20 or older with them. For treatment visits, patients cannot have anyone with them due to current Covid guidelines and our immunocompromised population.

## 2022-04-17 NOTE — Assessment & Plan Note (Signed)
She has stable pain control since we resumed chemotherapy She will continue current prescribed pain medicine

## 2022-04-17 NOTE — Assessment & Plan Note (Signed)
Overall, she tolerated reduced dose Doxil at intervals of every 5 weeks better We will continue at current dose Recent repeat echocardiogram she is satisfactory We will repeat CT imaging after her treatment in June for further follow-up

## 2022-04-23 ENCOUNTER — Telehealth: Payer: Self-pay

## 2022-04-23 NOTE — Telephone Encounter (Signed)
Returned her call. She is requesting a refill on Hydrocodone Rx. Told her Dr. Alvy Bimler would respond tomorrow. She verbalized understanding.

## 2022-04-24 ENCOUNTER — Encounter: Payer: Self-pay | Admitting: Hematology and Oncology

## 2022-04-24 ENCOUNTER — Other Ambulatory Visit: Payer: Self-pay | Admitting: Hematology and Oncology

## 2022-04-24 MED ORDER — HYDROCODONE-ACETAMINOPHEN 10-325 MG PO TABS: 1.0000 | ORAL_TABLET | Freq: Four times a day (QID) | ORAL | 0 refills | Status: DC | PRN

## 2022-04-24 NOTE — Telephone Encounter (Signed)
Refill sent.

## 2022-04-24 NOTE — Telephone Encounter (Signed)
Called and left a message Rx sent. Ask her to call the office back if needed. 

## 2022-05-19 ENCOUNTER — Telehealth: Payer: Self-pay

## 2022-05-19 ENCOUNTER — Other Ambulatory Visit: Payer: Self-pay | Admitting: Hematology and Oncology

## 2022-05-19 MED ORDER — HYDROCODONE-ACETAMINOPHEN 10-325 MG PO TABS: 1.0000 | ORAL_TABLET | Freq: Four times a day (QID) | ORAL | 0 refills | Status: DC | PRN

## 2022-05-19 NOTE — Telephone Encounter (Signed)
done

## 2022-05-19 NOTE — Telephone Encounter (Signed)
She called and left a message requesting Norco refill to pharmacy.

## 2022-05-21 MED FILL — Dexamethasone Sodium Phosphate Inj 100 MG/10ML: INTRAMUSCULAR | Qty: 1 | Status: AC

## 2022-05-22 ENCOUNTER — Inpatient Hospital Stay (HOSPITAL_BASED_OUTPATIENT_CLINIC_OR_DEPARTMENT_OTHER): Payer: 59 | Admitting: Hematology and Oncology

## 2022-05-22 ENCOUNTER — Encounter: Payer: Self-pay | Admitting: Hematology and Oncology

## 2022-05-22 ENCOUNTER — Inpatient Hospital Stay: Payer: 59

## 2022-05-22 ENCOUNTER — Inpatient Hospital Stay: Payer: 59 | Attending: Hematology and Oncology

## 2022-05-22 ENCOUNTER — Other Ambulatory Visit: Payer: Self-pay

## 2022-05-22 VITALS — BP 148/84 | HR 66 | Resp 17

## 2022-05-22 DIAGNOSIS — D61818 Other pancytopenia: Secondary | ICD-10-CM | POA: Diagnosis not present

## 2022-05-22 DIAGNOSIS — Z90710 Acquired absence of both cervix and uterus: Secondary | ICD-10-CM | POA: Diagnosis not present

## 2022-05-22 DIAGNOSIS — G893 Neoplasm related pain (acute) (chronic): Secondary | ICD-10-CM | POA: Diagnosis not present

## 2022-05-22 DIAGNOSIS — I10 Essential (primary) hypertension: Secondary | ICD-10-CM | POA: Diagnosis not present

## 2022-05-22 DIAGNOSIS — Z90722 Acquired absence of ovaries, bilateral: Secondary | ICD-10-CM | POA: Diagnosis not present

## 2022-05-22 DIAGNOSIS — Z85038 Personal history of other malignant neoplasm of large intestine: Secondary | ICD-10-CM | POA: Insufficient documentation

## 2022-05-22 DIAGNOSIS — C562 Malignant neoplasm of left ovary: Secondary | ICD-10-CM | POA: Insufficient documentation

## 2022-05-22 DIAGNOSIS — Z9221 Personal history of antineoplastic chemotherapy: Secondary | ICD-10-CM | POA: Insufficient documentation

## 2022-05-22 DIAGNOSIS — Z9079 Acquired absence of other genital organ(s): Secondary | ICD-10-CM | POA: Diagnosis not present

## 2022-05-22 DIAGNOSIS — C786 Secondary malignant neoplasm of retroperitoneum and peritoneum: Secondary | ICD-10-CM | POA: Insufficient documentation

## 2022-05-22 DIAGNOSIS — R634 Abnormal weight loss: Secondary | ICD-10-CM | POA: Diagnosis not present

## 2022-05-22 DIAGNOSIS — Z5111 Encounter for antineoplastic chemotherapy: Secondary | ICD-10-CM | POA: Insufficient documentation

## 2022-05-22 DIAGNOSIS — Z79899 Other long term (current) drug therapy: Secondary | ICD-10-CM | POA: Insufficient documentation

## 2022-05-22 LAB — CMP (CANCER CENTER ONLY)
ALT: <5 U/L (ref 0–44)
AST: 10 U/L — ABNORMAL LOW (ref 15–41)
Albumin: 4 g/dL (ref 3.5–5.0)
Alkaline Phosphatase: 51 U/L (ref 38–126)
Anion gap: 6 (ref 5–15)
BUN: 16 mg/dL (ref 8–23)
CO2: 29 mmol/L (ref 22–32)
Calcium: 9.3 mg/dL (ref 8.9–10.3)
Chloride: 106 mmol/L (ref 98–111)
Creatinine: 0.91 mg/dL (ref 0.44–1.00)
GFR, Estimated: >60 mL/min (ref >60)
Glucose, Bld: 121 mg/dL — ABNORMAL HIGH (ref 70–99)
Potassium: 3.5 mmol/L (ref 3.5–5.1)
Sodium: 141 mmol/L (ref 135–145)
Total Bilirubin: 0.4 mg/dL (ref 0.3–1.2)
Total Protein: 6.9 g/dL (ref 6.5–8.1)

## 2022-05-22 LAB — CBC WITH DIFFERENTIAL (CANCER CENTER ONLY)
Abs Immature Granulocytes: 0.01 10*3/uL (ref 0.00–0.07)
Basophils Absolute: 0 10*3/uL (ref 0.0–0.1)
Basophils Relative: 0 %
Eosinophils Absolute: 0 10*3/uL (ref 0.0–0.5)
Eosinophils Relative: 1 %
HCT: 27.5 % — ABNORMAL LOW (ref 36.0–46.0)
Hemoglobin: 9.5 g/dL — ABNORMAL LOW (ref 12.0–15.0)
Immature Granulocytes: 0 %
Lymphocytes Relative: 36 %
Lymphs Abs: 1.4 10*3/uL (ref 0.7–4.0)
MCH: 34.8 pg — ABNORMAL HIGH (ref 26.0–34.0)
MCHC: 34.5 g/dL (ref 30.0–36.0)
MCV: 100.7 fL — ABNORMAL HIGH (ref 80.0–100.0)
Monocytes Absolute: 0.4 10*3/uL (ref 0.1–1.0)
Monocytes Relative: 11 %
Neutro Abs: 2 10*3/uL (ref 1.7–7.7)
Neutrophils Relative %: 52 %
Platelet Count: 132 10*3/uL — ABNORMAL LOW (ref 150–400)
RBC: 2.73 MIL/uL — ABNORMAL LOW (ref 3.87–5.11)
RDW: 14.8 % (ref 11.5–15.5)
WBC Count: 3.9 10*3/uL — ABNORMAL LOW (ref 4.0–10.5)
nRBC: 0 % (ref 0.0–0.2)

## 2022-05-22 MED ORDER — SODIUM CHLORIDE 0.9 % IV SOLN
10.0000 mg | Freq: Once | INTRAVENOUS | Status: AC
  Administered 2022-05-22: 10 mg via INTRAVENOUS
  Filled 2022-05-22: qty 10

## 2022-05-22 MED ORDER — DEXTROSE 5 % IV SOLN: Freq: Once | INTRAVENOUS | Status: AC

## 2022-05-22 MED ORDER — SODIUM CHLORIDE 0.9% FLUSH
10.0000 mL | Freq: Once | INTRAVENOUS | Status: AC
  Administered 2022-05-22: 10 mL

## 2022-05-22 MED ORDER — HEPARIN SOD (PORK) LOCK FLUSH 100 UNIT/ML IV SOLN
500.0000 [IU] | Freq: Once | INTRAVENOUS | Status: AC | PRN
  Administered 2022-05-22: 500 [IU]

## 2022-05-22 MED ORDER — DOXORUBICIN HCL LIPOSOMAL CHEMO INJECTION 2 MG/ML
43.0000 mg/m2 | Freq: Once | INTRAVENOUS | Status: AC
  Administered 2022-05-22: 70 mg via INTRAVENOUS
  Filled 2022-05-22: qty 25

## 2022-05-22 MED ORDER — SODIUM CHLORIDE 0.9% FLUSH
10.0000 mL | INTRAVENOUS | Status: DC | PRN
  Administered 2022-05-22: 10 mL

## 2022-05-22 NOTE — Assessment & Plan Note (Signed)
She has stable pain control since we resumed chemotherapy She will continue current prescribed pain medicine

## 2022-05-22 NOTE — Assessment & Plan Note (Signed)
Overall, she tolerated reduced dose Doxil at intervals of every 5 weeks better We will continue at current dose Recent repeat echocardiogram she is satisfactory We will repeat CT imaging and ECHO after her treatment in August for further follow-up

## 2022-05-22 NOTE — Assessment & Plan Note (Signed)
This is stable Observe closely She does not need transfusion support We will space out her treatment every 5 weeks since she tolerated this change in plan better

## 2022-05-22 NOTE — Progress Notes (Signed)
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 ([place]) Overall, she tolerated reduced dose Doxil at intervals of every 5 weeks better We will continue at current dose Recent repeat echocardiogram she is satisfactory We will repeat CT imaging and ECHO after her treatment in August for further follow-up  Pancytopenia, acquired [facility]) This is stable Observe closely She does not need transfusion support We will space out her treatment every 5 weeks since she tolerated this change in plan better  Cancer associated pain She has stable pain control since we resumed chemotherapy She will continue current prescribed pain medicine  No orders of the defined types were placed in this encounter.   All questions were answered. The patient knows to call the clinic with any problems, questions or concerns. The total time spent in the appointment was 20 minutes encounter with patients including review of chart and various tests results, discussions about plan of care and coordination of care plan   Heath Lark, MD 05/22/2022 1:50 PM  INTERVAL HISTORY: Please see below for problem oriented charting. she returns for treatment follow-up for single agent Doxil She has lost some weight due to recent poor appetite but overall, she felt her appetite has improved Denies recent nausea Her chronic pain is stable  REVIEW OF SYSTEMS:   Constitutional: Denies fevers, chills  Eyes: Denies blurriness of vision Ears, nose, mouth, throat, and face: Denies mucositis or sore throat Respiratory: Denies cough, dyspnea or  wheezes Cardiovascular: Denies palpitation, chest discomfort or lower extremity swelling Gastrointestinal:  Denies nausea, heartburn or change in bowel habits Skin: Denies abnormal skin rashes Lymphatics: Denies new lymphadenopathy or easy bruising Neurological:Denies numbness, tingling or new weaknesses Behavioral/Psych: Mood is stable, no new changes  All other systems were reviewed with the patient and are negative.  I have reviewed the past medical history, past surgical history, social history and family history with the patient and they are unchanged from previous note.  ALLERGIES:  is allergic to other, demerol [meperidine], morphine and related, oxycodone, penicillins, strawberry flavor, tylenol [acetaminophen], and ibuprofen.  MEDICATIONS:  Current Outpatient Medications  Medication Sig Dispense Refill   acetaminophen (TYLENOL) 325 MG tablet Take 325 mg by mouth as needed.     docusate sodium 100 MG CAPS Take 100 mg by mouth 2 (two) times daily as needed for mild constipation. 10 capsule 0   HYDROcodone-acetaminophen (NORCO) 10-325 MG tablet Take 1 tablet by mouth every 6 (six) hours as needed. 90 tablet 0   lidocaine (LIDODERM) 5 % APPLY ONE PATCH TO SKIN DAILY. REMOVE AND DISCARD WITHIN 12 HOURS OR AS DIRECTED BY DR 30 patch 2   lidocaine-prilocaine (EMLA) cream APPLY TO THE AFFECTED AREA(S) TOPICALLY AS NEEDED 30 g 2   losartan (COZAAR) 100 MG tablet TAKE 1 TABLET BY MOUTH EVERY DAY 30 tablet 3   LYRICA 200 MG capsule TAKE ONE CAPSULE BY MOUTH EVERY 8 HOURS. PLEASE GIVE PATIENT BRAND NAME LYRICA 90 capsule 4   ondansetron (ZOFRAN-ODT) 4 MG disintegrating tablet TAKE 1 TABLET BY MOUTH EVERY 8 HOURS AS NEEDED 90 tablet 0   prochlorperazine (COMPAZINE) 10 MG tablet Take 1 tablet (10 mg total) by mouth  every 6 (six) hours as needed (Nausea or vomiting). 30 tablet 1   No current facility-administered medications for this visit.   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    09/18/2014 Initial Diagnosis   Adenocarcinoma of right colon   09/18/2014 Cancer Staging   Staging form: Colon and Rectum, AJCC 7th Edition - Clinical stage from 09/18/2014: Stage IIIB (T3, N1c, M0) - Signed by Heath Lark, MD on 02/06/2022 Laterality: Right Histologic grade (G): G2 Residual tumor (R): R0 - None Tumor deposits (TD): Present Perineural invasion (PNI): Absent Microsatellite instability (MSI): Stable   08/06/2016 Imaging   CT chest/abd/pelvis with contrast IMPRESSION: 1. No recurrent malignancy identified. 2. 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 On the prior exam from 09/13/2015 there was back flow of contrast in the left external iliac artery to supply the left internal iliac artery ; this back flow of contrast is no longer present in the left external iliac artery is occluded. 3. Several pulmonary nodules at or  below 4 mm diameter, unchanged. 4. 3 mm hypodensity posteriorly in the body of the pancreas, not well seen previously and subtle today, likely incidental, but merits observation. 5. 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 Prominent degenerative spurring of both acetabula. 6. Lumbar spondylosis and degenerative disc disease causing prominent impingement at L4-5 and mild impingement at L5-S1.   03/08/2017 Imaging   CT CAP W Contrast 03/08/17 IMPRESSION: Chest Impression: 1. Stable small pulmonary nodules within LEFT lung. 2. No mediastinal lymphadenopathy.  Abdomen / Pelvis Impression: 1. No evidence of metastatic colorectal cancer or ovarian cancer within the abdomen pelvis. 2. No lymphadenopathy. 3. Postsurgical change consistent RIGHT hemicolectomy and hysterectomy 4. A fem-fem arterial bypass noted.   11/13/2017 Imaging   CT AP w contrast IMPRESSION: 1. No findings of recurrent malignancy in the abdomen or pelvis. 2. Other imaging findings of potential clinical significance:Chronic AVN of both femoral heads without contour abnormality. Lumbar impingement at L4-5 and L5-S1. Aortic Atherosclerosis (ICD10-I70.0). 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 Partial right hemicolectomy. Hysterectomy.   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 ([place])  10/09/2014 Imaging   CT of the abdomen and pelvis showed a 9.1 cm solid and cystic mass in the left pelvis concerning for malignancy. This was discovered during her workup  for colon cancer.   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 Tumor size 3.7 cm, no lymph nodes removed. T1cNx. Tumor cells are positive for cytokeratin 7 and estrogen receptor.   11/01/2014 Surgery   B/l Salpingo-oophorectomy.   11/01/2014 Initial Diagnosis   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    11/28/2014 Cancer Staging   Staging form: Ovary, AJCC 7th Edition - Clinical: Stage III (rT3, N0, M0) - Signed by Heath Lark, MD on 06/09/2019   03/08/2017 Imaging   CT CAP W Contrast 03/08/17 IMPRESSION: Chest Impression:  1. Stable small pulmonary nodules within LEFT lung. 2. No mediastinal lymphadenopathy.  Abdomen / Pelvis Impression:  1. No evidence of metastatic colorectal cancer or ovarian cancer within the abdomen pelvis. 2. No lymphadenopathy. 3. Postsurgical change consistent RIGHT hemicolectomy and hysterectomy 4. A fem-fem arterial bypass noted.   03/12/2017 Mammogram   MM Right Breast 03/12/17 IMPRESSION: No mammographic or sonographic evidence of malignancy. No correlate identified for the abnormal enhancement seen within the right nipple on MRI of 02/27/2017.    04/02/2017 Pathology Results   Surgical Pathology of Nipple Biopsy: 04/02/17 Diagnosis Nipple Biopsy, Right - BENIGN NIPPLE TISSUE. - NO ATYPIA OR MALIGNANCY.    11/13/2017 Imaging   1. No findings of recurrent malignancy in the abdomen or pelvis. 2. Other imaging findings of potential clinical significance: Chronic AVN of both femoral heads without contour abnormality. Lumbar impingement at L4-5 and L5-S1. Aortic Atherosclerosis (ICD10-I70.0). 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 Partial right hemicolectomy. Hysterectomy.   03/22/2018 Mammogram   Screening mammogram IMPRESSION No evidence of malignancy.     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   06/06/2019 Imaging   1. Tiny bilateral pulmonary nodules stable since 11/12/2018. Continued close attention on follow-up recommended. 2. Incompletely visualized  upper abdominal lymphadenopathy better characterized on the abdomen CT from 2 weeks ago. 3.  Aortic Atherosclerois (ICD10-170.0) 4.  Emphysema. ([id number])   06/07/2019 Procedure   Technically successful CT guided core needle biopsy of indeterminate peritoneal nodule within the right lower abdomen/pelvis.   06/07/2019 Pathology Results   Soft Tissue Needle Core Biopsy, peritoneal nodule within right lower abdomen - METASTATIC CARCINOMA CONSISTENT WITH PATIENT'S CLINICAL HISTORY OF PRIMARY OVARIAN CARCINOMA. SEE NOTE Diagnosis Note Immunohistochemical stains show that the tumor cells are positive for CK7, ER and PAX 8; and negative for CK20 and CDX2. This immunostaining profile is consistent with above diagnosis.   06/16/2019 Tumor Marker   Patient's tumor was tested for the following markers: CA-125 Results of the tumor marker test revealed 499   06/20/2019 - 11/14/2019 Chemotherapy   The patient had carboplatin and taxol   07/11/2019 Tumor Marker   Patient's tumor was tested for the following markers: CA-125 Results of the tumor marker test revealed 438   08/05/2019 Tumor Marker   Patient's tumor was tested for the following markers: CA-125 Results of the tumor marker test revealed 272.   08/25/2019 Imaging   Ct chest, abdomen and pelvis 1. Slight interval decrease in size of a right lower quadrant peritoneal nodule measuring 1.7 x 1.6 cm, previously 2.2 x 2.2 cm (series 2, image  94, series 5, image 58). Findings are consistent with treatment response.   2. No significant change in ill-defined soft tissue about the sigmoid colon in the hysterectomy bed (series 2, image 101).   3.  Status post hysterectomy.   4. No evidence of metastatic disease in the chest. Stable benign small pulmonary nodules.   5.  Coronary artery disease.  Aortic Atherosclerosis (ICD10-I70.0).     08/26/2019 Tumor Marker   Patient's tumor was tested for the following markers: CA-125 Results of the tumor  marker test revealed 189.   09/26/2019 Tumor Marker   Patient's tumor was tested for the following markers: CA-125 Results of the tumor marker test revealed 137   10/03/2019 Tumor Marker   Patient's tumor was tested for the following markers: CA-125 Results of the tumor marker test revealed 137.   10/31/2019 Tumor Marker   Patient's tumor was tested for the following markers: CA-125 Results of the tumor marker test revealed 101   11/29/2019 Imaging   1. Right lower quadrant peritoneal implant is decreased. Stable irregular nodular pelvic peritoneal thickening superior to the vaginal cuff. 2. No new or progressive metastatic disease. No abdominopelvic lymphadenopathy. 3.  Aortic Atherosclerosis (ICD10-I70.0).   12/15/2019 -  Chemotherapy   The patient had Lynparza for chemotherapy treatment.     12/23/2019 Tumor Marker   Patient's tumor was tested for the following markers: CA-125 Results of the tumor marker test revealed 55.9   01/02/2020 Tumor Marker   Patient's tumor was tested for the following markers: CA-125. Results of the tumor marker test revealed 58.6   02/03/2020 Tumor Marker   Patient's tumor was tested for the following markers: CA-125 Results of the tumor marker test revealed 52.2   02/24/2020 Tumor Marker   Patient's tumor was tested for the following markers: CA-125 Results of the tumor marker test revealed 64.8   04/19/2020 Imaging   1. Continued slight interval decrease in size of a soft tissue nodule in the right paracolic gutter, consistent with treatment response. 2. Soft tissue in the hysterectomy bed near the distal sigmoid colon and rectum may be slightly decreased in size, difficult to measure due to configuration but apparently diminished in size, also consistent with treatment response. 3. Status post hysterectomy and oophorectomy as well as right colon resection. 4. No evidence of new metastatic disease in the abdomen or pelvis. 5. Redemonstrated long segment  occlusion of the left common, internal, and external iliac arteries. Status post femoral-femoral bypass grafting with reconstitution of the left common femoral artery. Aortic Atherosclerosis (ICD10-I70.0).   08/15/2020 Tumor Marker   Patient's tumor was tested for the following markers: CA-125 Results of the tumor marker test revealed 47.1   10/22/2020 Imaging   1. Reduced size of the nodule along the right paracolic gutter, currently 0.6 by 1.2 cm, previously 0.7 by 1.4 cm on 04/19/2020 and previously 0.9 by 1.5 cm on 11/29/2019. Appearance compatible with treatment response. 2. Further mild reduction in size of the bandlike density along the rectosigmoid mesentery, probably from scarring or effectively treated tumor. 3. Prominent stool throughout the colon favors constipation.  4. Stable chronic occlusion of the left iliac arteries, with a patent fem-fem bypass. 5. 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 6. Lower lumbar spondylosis and degenerative disc disease causing impingement at L4-5 and L5-S1. 7. Aortic atherosclerosis.     10/23/2020 Tumor Marker   Patient's tumor was tested for the following markers:  CA-125 Results of the tumor marker test revealed 74.6   01/21/2021 Tumor Marker   Patient's tumor was tested for the following markers: CA-125 Results of the tumor marker test revealed 825   01/28/2021 Imaging   1. Changes of right hemicolectomy with slightly decreased size of the nodule along the right pericolic gutter and similar band like density along the rectosigmoid mesentery. 2. Two new well-circumscribed low-density nodules in the presacral perirectal space, difficult to ascertain whether they arise from the bowel wall or arise from within the peritoneum. This is a nonspecific finding which could be further evaluated with colonoscopy, repeat CT with rectal contrast or attention on close interval  follow-up imaging in 3 months. 3. 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 4. 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 5. Emphysema and aortic atherosclerosis. Aortic Atherosclerosis (ICD10-I70.0) and Emphysema (ICD10-J43.9).   02/08/2021 Procedure   Successful placement of a right internal jugular approach power injectable Port-A-Cath. The catheter is ready for immediate use.     02/11/2021 - 06/06/2021 Chemotherapy   Received carbo/gem and avastin       02/18/2021 Tumor Marker   Patient's tumor was tested for the following markers: CA-125 Results of the tumor marker test revealed 852   03/04/2021 Tumor Marker   Patient's tumor was tested for the following markers: CA-125. Results of the tumor marker test revealed 541   03/11/2021 Tumor Marker   Patient's tumor was tested for the following markers: CA-125 Results of the tumor marker test revealed 568   03/25/2021 Tumor Marker   Patient's tumor was tested for the following markers:CA-125 Results of the tumor marker test revealed 367.   04/08/2021 Tumor Marker   Patient's tumor was tested for the following markers: CA-125 Results of the tumor marker test revealed 273   04/29/2021 Tumor Marker   Patient's tumor was tested for the following markers: CA-125 Results of the tumor marker test revealed 431   06/06/2021 Tumor Marker   Patient's tumor was tested for the following markers: CA-125. Results of the tumor marker test revealed 594.   06/18/2021 Imaging   1. Nodular fascial thickening with subtle increase along the RIGHT pericolic gutter as described. Short interval follow-up may be helpful. 2. Stable appearance of fascial thickening and serosal thickening in the pelvis, potentially indicative of peritoneal disease. 3. Post RIGHT hemicolectomy. 4. Stable chronic occlusion of LEFT iliac vasculature with patent femoral femoral bypass grafting. 5. Signs of avascular  necrosis of bilateral femoral heads as before. 6. Aortic atherosclerosis.   Aortic Atherosclerosis (ICD10-I70.0).     07/16/2021 - 09/27/2021 Chemotherapy   Patient is on Treatment Plan : Ovarian Bevacizumab q21d     07/17/2021 Tumor Marker   Patient's tumor was tested for the following markers: CA-125. Results of the tumor marker test revealed 996.   08/06/2021 Tumor Marker   Patient's tumor was tested for the following markers: CA-125. Results of the tumor marker test revealed 975.   10/29/2021 Imaging   1. Unfortunately, there is evidence progression of peritoneal metastasis. Most obvious progression is along the RIGHT pericolic gutter with there is increased peritoneal thickening. 2. Small volume fluid in the pelvis with enhancing peritoneal surface. Peritoneal enhancement is similar; however, the small volume of fluid is increased. 3. Interval increase in size of small periaortic retroperitoneal lymph node.   11/12/2021 -  Chemotherapy   Patient is on Treatment Plan : OVARIAN Liposomal Doxorubicin q28d  11/12/2021 Echocardiogram   1. Left ventricular ejection fraction, by estimation, is 60 to 65%. The left ventricle has normal function. The left ventricle has no regional wall motion abnormalities. There is mild asymmetric left ventricular hypertrophy of the septal and anterior segments. Left ventricular diastolic parameters were normal.  2. Right ventricular systolic function is normal. The right ventricular size is normal.  3. The mitral valve is normal in structure. Mild mitral valve regurgitation.  4. The aortic valve is normal in structure. Aortic valve regurgitation is mild to moderate. No aortic stenosis is present.     02/06/2022 Imaging   1. Unchanged peritoneal thickening and nodularity, most conspicuously in the right paracolic gutter, although also with extensive peritoneal thickening and nodularity in the low pelvis. Trace malignant ascites in the low pelvis. 2.  Unchanged enlarged left external iliac lymph node. 3. Findings are consistent with stable metastatic disease. No evidence of new metastatic disease in the abdomen or pelvis. 4. Status post hysterectomy, oophorectomy, and omentectomy. 5. Chronic, long segment occlusion of the left common iliac, external iliac, and internal iliac arteries from the origin. Status post femoral femoral bypass graft.   04/10/2022 Echocardiogram    1. Left ventricular ejection fraction, by estimation, is 55 to 60%. The left ventricle has normal function. The left ventricle has no regional wall motion abnormalities. There is mild concentric left ventricular hypertrophy. Left ventricular diastolic parameters were normal. The average left ventricular global longitudinal strain is -13.2 %. The global longitudinal strain is abnormal.  2. Right ventricular systolic function is normal. The right ventricular size is normal. There is normal pulmonary artery systolic pressure. The estimated right ventricular systolic pressure is 02.6 mmHg.  3. The mitral valve is grossly normal. Mild mitral valve regurgitation. No evidence of mitral stenosis.  4. The aortic valve is tricuspid. Aortic valve regurgitation is mild. No aortic stenosis is present.  5. The inferior vena cava is normal in size with greater than 50% respiratory variability, suggesting right atrial pressure of 3 mmHg.     PHYSICAL EXAMINATION: ECOG PERFORMANCE STATUS: 1 - Symptomatic but completely ambulatory  Vitals:   05/22/22 1234  BP: (!) 149/79  Pulse: 84  Resp: 18  Temp: 98.1 F (36.7 C)  SpO2: 100%   There were no vitals filed for this visit.  GENERAL:alert, no distress and comfortable NEURO: alert & oriented x 3 with fluent speech, no focal motor/sensory deficits  LABORATORY DATA:  I have reviewed the data as listed    Component Value Date/Time   NA 141 05/22/2022 1217   NA 141 10/07/2017 1436   K 3.5 05/22/2022 1217   K 4.4 10/07/2017 1436   CL  106 05/22/2022 1217   CO2 29 05/22/2022 1217   CO2 26 10/07/2017 1436   GLUCOSE 121 (H) 05/22/2022 1217   GLUCOSE 85 10/07/2017 1436   BUN 16 05/22/2022 1217   BUN 14.9 10/07/2017 1436   CREATININE 0.91 05/22/2022 1217   CREATININE 1.0 10/07/2017 1436   CALCIUM 9.3 05/22/2022 1217   CALCIUM 9.5 10/07/2017 1436   PROT 6.9 05/22/2022 1217   PROT 7.8 10/07/2017 1436   ALBUMIN 4.0 05/22/2022 1217   ALBUMIN 3.9 10/07/2017 1436   AST 10 (L) 05/22/2022 1217   AST 15 10/07/2017 1436   ALT <5 05/22/2022 1217   ALT 7 10/07/2017 1436   ALKPHOS 51 05/22/2022 1217   ALKPHOS 81 10/07/2017 1436   BILITOT 0.4 05/22/2022 1217   BILITOT 0.54 10/07/2017 1436   GFRNONAA >  60 05/22/2022 1217   GFRAA >60 08/15/2020 1121    No results found for: "SPEP", "UPEP"  Lab Results  Component Value Date   WBC 3.9 (L) 05/22/2022   NEUTROABS 2.0 05/22/2022   HGB 9.5 (L) 05/22/2022   HCT 27.5 (L) 05/22/2022   MCV 100.7 (H) 05/22/2022   PLT 132 (L) 05/22/2022      Chemistry      Component Value Date/Time   NA 141 05/22/2022 1217   NA 141 10/07/2017 1436   K 3.5 05/22/2022 1217   K 4.4 10/07/2017 1436   CL 106 05/22/2022 1217   CO2 29 05/22/2022 1217   CO2 26 10/07/2017 1436   BUN 16 05/22/2022 1217   BUN 14.9 10/07/2017 1436   CREATININE 0.91 05/22/2022 1217   CREATININE 1.0 10/07/2017 1436      Component Value Date/Time   CALCIUM 9.3 05/22/2022 1217   CALCIUM 9.5 10/07/2017 1436   ALKPHOS 51 05/22/2022 1217   ALKPHOS 81 10/07/2017 1436   AST 10 (L) 05/22/2022 1217   AST 15 10/07/2017 1436   ALT <5 05/22/2022 1217   ALT 7 10/07/2017 1436   BILITOT 0.4 05/22/2022 1217   BILITOT 0.54 10/07/2017 1436

## 2022-05-22 NOTE — Patient Instructions (Signed)
[facility]  Discharge Instructions: Thank you for choosing [facility] to provide your oncology and hematology care.   If you have a lab appointment with the [facility], please go directly to the [facility] and check in at the registration area.   Wear comfortable clothing and clothing appropriate for easy access to any Portacath or PICC line.   We strive to give you quality time with your provider. You may need to reschedule your appointment if you arrive late (15 or more minutes).  Arriving late affects you and other patients whose appointments are after yours.  Also, if you miss three or more appointments without notifying the office, you may be dismissed from the clinic at the provider's discretion.      For prescription refill requests, have your pharmacy contact our office and allow 72 hours for refills to be completed.    Today you received the following chemotherapy and/or immunotherapy agents: Doxil      To help prevent nausea and vomiting after your treatment, we encourage you to take your nausea medication as directed.  BELOW ARE SYMPTOMS THAT SHOULD BE REPORTED IMMEDIATELY: *FEVER GREATER THAN 100.4 F (38 C) OR HIGHER *CHILLS OR SWEATING *NAUSEA AND VOMITING THAT IS NOT CONTROLLED WITH YOUR NAUSEA MEDICATION *UNUSUAL SHORTNESS OF BREATH *UNUSUAL BRUISING OR BLEEDING *URINARY PROBLEMS (pain or burning when urinating, or frequent urination) *BOWEL PROBLEMS (unusual diarrhea, constipation, pain near the anus) TENDERNESS IN MOUTH AND THROAT WITH OR WITHOUT PRESENCE OF ULCERS (sore throat, sores in mouth, or a toothache) UNUSUAL RASH, SWELLING OR PAIN  UNUSUAL VAGINAL DISCHARGE OR ITCHING   Items with * indicate a potential emergency and should be followed up as soon as possible or go to the Emergency Department if any problems should occur.  Please show the CHEMOTHERAPY ALERT CARD or IMMUNOTHERAPY ALERT CARD at check-in to the  Emergency Department and triage nurse.  Should you have questions after your visit or need to cancel or reschedule your appointment, please contact [facility]  Dept: [phone]  and follow the prompts.  Office hours are 8:00 a.m. to 4:30 p.m. Monday - Friday. Please note that voicemails left after 4:00 p.m. may not be returned until the following business day.  We are closed weekends and major holidays. You have access to a nurse at all times for urgent questions. Please call the main number to the clinic Dept: [phone] and follow the prompts.   For any non-urgent questions, you may also contact your provider using MyChart. We now offer e-Visits for anyone 69 and older to request care online for non-urgent symptoms. For details visit [email or web address].   Also download the MyChart app! Go to the app store, search "MyChart", open the app, select [facility], and log in with your MyChart username and password.  Masks are optional in the cancer centers. If you would like for your care team to wear a mask while they are taking care of you, please let them know. For doctor visits, patients may have with them one support person who is at least 61 years old. At this time, visitors are not allowed in the infusion area.

## 2022-05-23 ENCOUNTER — Telehealth: Payer: Self-pay

## 2022-05-23 ENCOUNTER — Other Ambulatory Visit: Payer: Self-pay | Admitting: Hematology and Oncology

## 2022-05-23 DIAGNOSIS — C562 Malignant neoplasm of left ovary: Secondary | ICD-10-CM

## 2022-05-23 LAB — CA 125: Cancer Antigen (CA) 125: 2037 U/mL — ABNORMAL HIGH (ref 0.0–38.1)

## 2022-05-23 NOTE — Telephone Encounter (Signed)
Called and left a message asking her to call the office back. 

## 2022-05-23 NOTE — Telephone Encounter (Signed)
-----   Message from Heath Lark, MD sent at 05/23/2022  7:50 AM EDT ----- Her CA-125 is worse I recommend repeat Ct on 7/12 and see me on 7/14 at 3 pm If she agrees, please tell her to call and schedule ([facility] or [facility]), order is in I need 30 mins appt

## 2022-05-23 NOTE — Telephone Encounter (Signed)
Called back and given below message. She verbalized understanding and agrees with recommendations. She will call to schedule CT on 7/12 and is aware on 7/14 appt with Dr. Alvy Bimler.

## 2022-06-03 ENCOUNTER — Other Ambulatory Visit: Payer: Self-pay | Admitting: Hematology and Oncology

## 2022-06-04 ENCOUNTER — Ambulatory Visit (HOSPITAL_COMMUNITY)
Admission: RE | Admit: 2022-06-04 | Discharge: 2022-06-04 | Disposition: A | Discharge status: Home / Self Care | Payer: 59 | Source: Ambulatory Visit | Attending: Hematology and Oncology | Admitting: Hematology and Oncology

## 2022-06-04 DIAGNOSIS — C562 Malignant neoplasm of left ovary: Secondary | ICD-10-CM | POA: Insufficient documentation

## 2022-06-04 MED ORDER — HEPARIN SOD (PORK) LOCK FLUSH 100 UNIT/ML IV SOLN
INTRAVENOUS | Status: AC
  Administered 2022-06-04: 500 [IU] via INTRAVENOUS
  Filled 2022-06-04: qty 5

## 2022-06-04 MED ORDER — IOHEXOL 300 MG/ML  SOLN
100.0000 mL | Freq: Once | INTRAMUSCULAR | Status: AC | PRN
  Administered 2022-06-04: 80 mL via INTRAVENOUS

## 2022-06-04 MED ORDER — HEPARIN SOD (PORK) LOCK FLUSH 100 UNIT/ML IV SOLN: 500.0000 [IU] | Freq: Once | INTRAVENOUS | Status: AC

## 2022-06-06 ENCOUNTER — Inpatient Hospital Stay: Payer: 59 | Attending: Hematology and Oncology | Admitting: Hematology and Oncology

## 2022-06-06 ENCOUNTER — Other Ambulatory Visit (HOSPITAL_COMMUNITY): Payer: Self-pay

## 2022-06-06 ENCOUNTER — Other Ambulatory Visit: Payer: Self-pay

## 2022-06-06 ENCOUNTER — Telehealth: Payer: Self-pay

## 2022-06-06 DIAGNOSIS — Z9079 Acquired absence of other genital organ(s): Secondary | ICD-10-CM | POA: Insufficient documentation

## 2022-06-06 DIAGNOSIS — Z7189 Other specified counseling: Secondary | ICD-10-CM | POA: Diagnosis not present

## 2022-06-06 DIAGNOSIS — Z90722 Acquired absence of ovaries, bilateral: Secondary | ICD-10-CM | POA: Diagnosis not present

## 2022-06-06 DIAGNOSIS — I10 Essential (primary) hypertension: Secondary | ICD-10-CM | POA: Diagnosis not present

## 2022-06-06 DIAGNOSIS — D61818 Other pancytopenia: Secondary | ICD-10-CM | POA: Diagnosis not present

## 2022-06-06 DIAGNOSIS — Z79899 Other long term (current) drug therapy: Secondary | ICD-10-CM | POA: Diagnosis not present

## 2022-06-06 DIAGNOSIS — Z90710 Acquired absence of both cervix and uterus: Secondary | ICD-10-CM | POA: Diagnosis not present

## 2022-06-06 DIAGNOSIS — C786 Secondary malignant neoplasm of retroperitoneum and peritoneum: Secondary | ICD-10-CM | POA: Insufficient documentation

## 2022-06-06 DIAGNOSIS — G893 Neoplasm related pain (acute) (chronic): Secondary | ICD-10-CM | POA: Insufficient documentation

## 2022-06-06 DIAGNOSIS — C562 Malignant neoplasm of left ovary: Secondary | ICD-10-CM | POA: Diagnosis present

## 2022-06-06 DIAGNOSIS — Z9221 Personal history of antineoplastic chemotherapy: Secondary | ICD-10-CM | POA: Diagnosis not present

## 2022-06-06 DIAGNOSIS — Z85038 Personal history of other malignant neoplasm of large intestine: Secondary | ICD-10-CM | POA: Diagnosis not present

## 2022-06-06 DIAGNOSIS — R634 Abnormal weight loss: Secondary | ICD-10-CM | POA: Diagnosis not present

## 2022-06-06 MED ORDER — METHADONE HCL 10 MG PO TABS
10.0000 mg | ORAL_TABLET | Freq: Two times a day (BID) | ORAL | 0 refills | Status: DC
  Filled 2022-06-06: qty 60, 30d supply, fill #0

## 2022-06-06 NOTE — Assessment & Plan Note (Addendum)
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

## 2022-06-06 NOTE — Progress Notes (Signed)
[facility] OFF[facility] PROGRESS NOTE  Patient Care Team: Neale Burly, MD as PCP - General (Internal Medicine) Judeth Horn, MD as Consulting Physician (General Surgery) Nat Math, MD as Referring Physician (Obstetrics and Gynecology) Elam Dutch, MD (Inactive) as Consulting Physician (Vascular Surgery) Heath Lark, MD as Consulting Physician (Hematology and Oncology)  ASSESSMENT & PLAN:  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 [facility]) Unfortunately she has progressed on last chemo regimen She has lost a lot of weight She is suffering from pain The benefit from next chemotherapy treatment is minimal, less than 20% Risks of chemotherapy is high, at the expense of poor quality of life and suffering I am concerned about risk of worsening neuropathy with Taxotere With topotecan, I am concerned about risk of severe pancytopenia and infection Ultimately, the patient is undecided She made phone call and reviewed the plan of care in my office with her daughter and son She would like to transfer her care to [place] and for her to relocate back to home to be closer with family We will proceed to cancel appointment in August  Cancer associated pain We reviewed pain management today She has worsening pain control She will continue current prescribed pain medicine along with addition of methadone  Goals of care, counseling/discussion We have numerous goals of care discussion in the past She is aware treatment goal is palliative We discussed importance of quality of life and being closer to family Ultimately, the patient has made informed decision to relocate back to [facility] in [place] to be closer to home  Weight loss, abnormal This is due to her disease We discussed importance of frequent small meals  No orders of the defined types were placed in this encounter.   All questions were answered. The patient knows to call the clinic with any problems, questions or  concerns. The total time spent in the appointment was 40 minutes encounter with patients including review of chart and various tests results, discussions about plan of care and coordination of care plan   Heath Lark, MD 06/06/2022 3:21 PM  INTERVAL HISTORY: Please see below for problem oriented charting. she returns for review of test results The patient has platinum refractory ovarian cancer Over the course of the last few months, she have lost tremendous amount of weight due to severe pelvic pain, poor appetite and feeling unwell I have reviewed CT imaging with the patient I have also reviewed results of her tumor marker The patient is very tearful We made phone calls in my office for her to discuss management with her family I reviewed results with her daughter over the phone  REVIEW OF SYSTEMS:  All other systems were reviewed with the patient and are negative.  I have reviewed the past medical history, past surgical history, social history and family history with the patient and they are unchanged from previous note.  ALLERGIES:  is allergic to other, demerol [meperidine], morphine and related, oxycodone, penicillins, strawberry flavor, tylenol [acetaminophen], and ibuprofen.  MEDICATIONS:  Current Outpatient Medications  Medication Sig Dispense Refill   methadone (DOLOPHINE) 10 MG tablet Take 1 tablet (10 mg total) by mouth every 12 (twelve) hours. 60 tablet 0   acetaminophen (TYLENOL) 325 MG tablet Take 325 mg by mouth as needed.     docusate sodium 100 MG CAPS Take 100 mg by mouth 2 (two) times daily as needed for mild constipation. 10 capsule 0   HYDROcodone-acetaminophen (NORCO) 10-325 MG tablet Take 1 tablet by mouth every 6 (  six) hours as needed. 90 tablet 0   lidocaine (LIDODERM) 5 % APPLY ONE PATCH TO SKIN DAILY. REMOVE AND DISCARD WITHIN 12 HOURS OR AS DIRECTED BY DR 30 patch 2   lidocaine-prilocaine (EMLA) cream APPLY TO THE AFFECTED AREA(S) TOPICALLY AS NEEDED 30 g 2    losartan (COZAAR) 100 MG tablet TAKE 1 TABLET BY MOUTH EVERY DAY 30 tablet 3   LYRICA 200 MG capsule TAKE ONE CAPSULE BY MOUTH EVERY 8 HOURS. PLEASE GIVE PATIENT BRAND NAME LYRICA 90 capsule 4   ondansetron (ZOFRAN-ODT) 4 MG disintegrating tablet TAKE 1 TABLET BY MOUTH EVERY 8 HOURS AS NEEDED 90 tablet 0   prochlorperazine (COMPAZINE) 10 MG tablet Take 1 tablet (10 mg total) by mouth every 6 (six) hours as needed (Nausea or vomiting). 30 tablet 1   No current facility-administered medications for this visit.    SUMMARY OF ONCOLOGIC HISTORY: Oncology History Overview Note  BRCA1 positive Progressed on carboplatin, gemzar, Olaparib, Avastin, doxil   Cancer of right colon ([place])  09/16/2014 Imaging   CT abd/pel:  a 3.8cm cecal mass with associated ileocecal intussusception, and a 9.1cm solid and cystic mass in the left pelvis,   09/18/2014 Surgery   right hemicolectomy and terminal ileoectomy   09/18/2014 Pathologic Stage   pT3pN1Mx, tumor extends into pericolonic soft tissue and is less than 52m from the serosal surface, LVI 8-), PNI (-), all 23 node negative, one soft tissue tumor deposit, surgical margins negative.    09/18/2014 Initial Diagnosis   Adenocarcinoma of right colon   09/18/2014 Cancer Staging   Staging form: Colon and Rectum, AJCC 7th Edition - Clinical stage from 09/18/2014: Stage IIIB (T3, N1c, M0) - Signed by GHeath Lark MD on 02/06/2022 Laterality: Right Histologic grade (G): G2 Residual tumor (R): R0 - None Tumor deposits (TD): Present Perineural invasion (PNI): Absent Microsatellite instability (MSI): Stable   08/06/2016 Imaging   CT chest/abd/pelvis with contrast IMPRESSION: 1. No recurrent malignancy identified. 2. 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 On the prior exam from 09/13/2015 there was back flow of contrast in the left external iliac artery to supply the left internal  iliac artery ; this back flow of contrast is no longer present in the left external iliac artery is occluded. 3. Several pulmonary nodules at or below 4 mm diameter, unchanged. 4. 3 mm hypodensity posteriorly in the body of the pancreas, not well seen previously and subtle today, likely incidental, but merits observation. 5. 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 Prominent degenerative spurring of both acetabula. 6. Lumbar spondylosis and degenerative disc disease causing prominent impingement at L4-5 and mild impingement at L5-S1.   03/08/2017 Imaging   CT CAP W Contrast 03/08/17 IMPRESSION: Chest Impression: 1. Stable small pulmonary nodules within LEFT lung. 2. No mediastinal lymphadenopathy.  Abdomen / Pelvis Impression: 1. No evidence of metastatic colorectal cancer or ovarian cancer within the abdomen pelvis. 2. No lymphadenopathy. 3. Postsurgical change consistent RIGHT hemicolectomy and hysterectomy 4. A fem-fem arterial bypass noted.   11/13/2017 Imaging   CT AP w contrast IMPRESSION: 1. No findings of recurrent malignancy in the abdomen or pelvis. 2. Other imaging findings of potential clinical significance:Chronic AVN of both femoral heads without contour abnormality. Lumbar impingement at L4-5 and L5-S1. Aortic Atherosclerosis (ICD10-I70.0). 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 Partial right hemicolectomy. Hysterectomy.  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 ([place])  10/09/2014 Imaging   CT of the abdomen and pelvis showed a 9.1 cm solid and cystic mass in the left pelvis concerning for malignancy. This was discovered during her workup for colon cancer.   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 Tumor size 3.7 cm, no lymph nodes removed. T1cNx. Tumor cells are positive for  cytokeratin 7 and estrogen receptor.   11/01/2014 Surgery   B/l Salpingo-oophorectomy.   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 Stable small pulmonary nodules within LEFT lung. 2. No mediastinal lymphadenopathy.  Abdomen / Pelvis Impression:  1. No evidence of metastatic colorectal cancer or ovarian cancer within the abdomen pelvis. 2. No lymphadenopathy. 3. Postsurgical change consistent RIGHT hemicolectomy and hysterectomy 4. A fem-fem arterial bypass noted.   03/12/2017 Mammogram   MM Right Breast 03/12/17 IMPRESSION: No mammographic or sonographic evidence of malignancy. No correlate identified for the abnormal enhancement seen within the right nipple on MRI of 02/27/2017.    04/02/2017 Pathology Results   Surgical Pathology of Nipple Biopsy: 04/02/17 Diagnosis Nipple Biopsy, Right - BENIGN NIPPLE TISSUE. - NO ATYPIA OR MALIGNANCY.    11/13/2017 Imaging   1. No findings of recurrent malignancy in the abdomen or pelvis. 2. Other imaging findings of potential clinical significance: Chronic AVN of both femoral heads without contour abnormality. Lumbar impingement at L4-5 and L5-S1. Aortic Atherosclerosis (ICD10-I70.0). 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 Partial right hemicolectomy. Hysterectomy.   03/22/2018 Mammogram   Screening mammogram IMPRESSION No evidence of malignancy.     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   06/06/2019  Imaging   1. Tiny bilateral pulmonary nodules stable since 11/12/2018. Continued close attention on follow-up recommended. 2. Incompletely visualized upper abdominal lymphadenopathy better characterized on the abdomen CT from 2 weeks ago. 3.  Aortic Atherosclerois (ICD10-170.0) 4.  Emphysema. (YBO17-P10.9)   06/07/2019 Procedure   Technically successful CT guided core needle biopsy of indeterminate peritoneal nodule within the right lower abdomen/pelvis.   06/07/2019 Pathology Results   Soft Tissue Needle Core Biopsy, peritoneal nodule within right lower abdomen - METASTATIC CARCINOMA CONSISTENT WITH PATIENT'S CLINICAL HISTORY OF PRIMARY OVARIAN CARCINOMA. SEE NOTE Diagnosis Note Immunohistochemical stains show that the tumor cells are positive for CK7, ER and PAX 8; and negative for CK20 and CDX2. This immunostaining profile is consistent with above diagnosis.   06/16/2019 Tumor Marker   Patient's tumor was tested for the following markers: CA-125 Results of the tumor marker test revealed 499   06/20/2019 - 11/14/2019 Chemotherapy   The patient had carboplatin and taxol   07/11/2019 Tumor Marker   Patient's tumor was tested for the following markers: CA-125 Results of the tumor marker test revealed 438   08/05/2019 Tumor Marker   Patient's tumor was tested for the following markers: CA-125 Results of the tumor marker test revealed 272.  08/25/2019 Imaging   Ct chest, abdomen and pelvis 1. Slight interval decrease in size of a right lower quadrant peritoneal nodule measuring 1.7 x 1.6 cm, previously 2.2 x 2.2 cm (series 2, image 94, series 5, image 58). Findings are consistent with treatment response.   2. No significant change in ill-defined soft tissue about the sigmoid colon in the hysterectomy bed (series 2, image 101).   3.  Status post hysterectomy.   4. No evidence of metastatic disease in the chest. Stable benign small pulmonary nodules.   5.  Coronary artery disease.   Aortic Atherosclerosis (ICD10-I70.0).     08/26/2019 Tumor Marker   Patient's tumor was tested for the following markers: CA-125 Results of the tumor marker test revealed 189.   09/26/2019 Tumor Marker   Patient's tumor was tested for the following markers: CA-125 Results of the tumor marker test revealed 137   10/03/2019 Tumor Marker   Patient's tumor was tested for the following markers: CA-125 Results of the tumor marker test revealed 137.   10/31/2019 Tumor Marker   Patient's tumor was tested for the following markers: CA-125 Results of the tumor marker test revealed 101   11/29/2019 Imaging   1. Right lower quadrant peritoneal implant is decreased. Stable irregular nodular pelvic peritoneal thickening superior to the vaginal cuff. 2. No new or progressive metastatic disease. No abdominopelvic lymphadenopathy. 3.  Aortic Atherosclerosis (ICD10-I70.0).   12/15/2019 -  Chemotherapy   The patient had Lynparza for chemotherapy treatment.     12/23/2019 Tumor Marker   Patient's tumor was tested for the following markers: CA-125 Results of the tumor marker test revealed 55.9   01/02/2020 Tumor Marker   Patient's tumor was tested for the following markers: CA-125. Results of the tumor marker test revealed 58.6   02/03/2020 Tumor Marker   Patient's tumor was tested for the following markers: CA-125 Results of the tumor marker test revealed 52.2   02/24/2020 Tumor Marker   Patient's tumor was tested for the following markers: CA-125 Results of the tumor marker test revealed 64.8   04/19/2020 Imaging   1. Continued slight interval decrease in size of a soft tissue nodule in the right paracolic gutter, consistent with treatment response. 2. Soft tissue in the hysterectomy bed near the distal sigmoid colon and rectum may be slightly decreased in size, difficult to measure due to configuration but apparently diminished in size, also consistent with treatment response. 3. Status post  hysterectomy and oophorectomy as well as right colon resection. 4. No evidence of new metastatic disease in the abdomen or pelvis. 5. Redemonstrated long segment occlusion of the left common, internal, and external iliac arteries. Status post femoral-femoral bypass grafting with reconstitution of the left common femoral artery. Aortic Atherosclerosis (ICD10-I70.0).   08/15/2020 Tumor Marker   Patient's tumor was tested for the following markers: CA-125 Results of the tumor marker test revealed 47.1   10/22/2020 Imaging   1. Reduced size of the nodule along the right paracolic gutter, currently 0.6 by 1.2 cm, previously 0.7 by 1.4 cm on 04/19/2020 and previously 0.9 by 1.5 cm on 11/29/2019. Appearance compatible with treatment response. 2. Further mild reduction in size of the bandlike density along the rectosigmoid mesentery, probably from scarring or effectively treated tumor. 3. Prominent stool throughout the colon favors constipation.  4. Stable chronic occlusion of the left iliac arteries, with a patent fem-fem bypass. 5. 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 6. Lower lumbar spondylosis and degenerative disc disease causing impingement at L4-5 and L5-S1. 7. Aortic atherosclerosis.     10/23/2020 Tumor Marker   Patient's tumor was tested for the following markers: CA-125 Results of the tumor marker test revealed 74.6   01/21/2021 Tumor Marker   Patient's tumor was tested for the following markers: CA-125 Results of the tumor marker test revealed 825   01/28/2021 Imaging   1. Changes of right hemicolectomy with slightly decreased size of the nodule along the right pericolic gutter and similar band like density along the rectosigmoid mesentery. 2. Two new well-circumscribed low-density nodules in the presacral perirectal space, difficult to ascertain whether they arise from the bowel wall or arise from within  the peritoneum. This is a nonspecific finding which could be further evaluated with colonoscopy, repeat CT with rectal contrast or attention on close interval follow-up imaging in 3 months. 3. 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 4. 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 5. Emphysema and aortic atherosclerosis. Aortic Atherosclerosis (ICD10-I70.0) and Emphysema (ICD10-J43.9).   02/08/2021 Procedure   Successful placement of a right internal jugular approach power injectable Port-A-Cath. The catheter is ready for immediate use.     02/11/2021 - 06/06/2021 Chemotherapy   Received carbo/gem and avastin       02/18/2021 Tumor Marker   Patient's tumor was tested for the following markers: CA-125 Results of the tumor marker test revealed 852   03/04/2021 Tumor Marker   Patient's tumor was tested for the following markers: CA-125. Results of the tumor marker test revealed 541   03/11/2021 Tumor Marker   Patient's tumor was tested for the following markers: CA-125 Results of the tumor marker test revealed 568   03/25/2021 Tumor Marker   Patient's tumor was tested for the following markers:CA-125 Results of the tumor marker test revealed 367.   04/08/2021 Tumor Marker   Patient's tumor was tested for the following markers: CA-125 Results of the tumor marker test revealed 273   04/29/2021 Tumor Marker   Patient's tumor was tested for the following markers: CA-125 Results of the tumor marker test revealed 431   06/06/2021 Tumor Marker   Patient's tumor was tested for the following markers: CA-125. Results of the tumor marker test revealed 594.   06/18/2021 Imaging   1. Nodular fascial thickening with subtle increase along the RIGHT pericolic gutter as described. Short interval follow-up may be helpful. 2. Stable appearance of fascial thickening and serosal thickening in the pelvis, potentially indicative of peritoneal  disease. 3. Post RIGHT hemicolectomy. 4. Stable chronic occlusion of LEFT iliac vasculature with patent femoral femoral bypass grafting. 5. 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 6. Aortic atherosclerosis.   Aortic Atherosclerosis (ICD10-I70.0).     07/16/2021 - 09/27/2021 Chemotherapy   Patient is on Treatment Plan : Ovarian Bevacizumab q21d     07/17/2021 Tumor Marker   Patient's tumor was tested for the following markers: CA-125. Results of the tumor marker test revealed 996.   08/06/2021 Tumor Marker   Patient's tumor was tested for the following markers: CA-125. Results of the tumor marker test revealed 975.   10/29/2021 Imaging   1. Unfortunately, there is evidence progression of peritoneal metastasis. Most obvious progression is along the RIGHT pericolic gutter with there is increased peritoneal thickening. 2. Small volume fluid in the pelvis with enhancing peritoneal surface. Peritoneal enhancement is similar; however, the small volume of fluid  is increased. 3. Interval increase in size of small periaortic retroperitoneal lymph node.   11/12/2021 - 05/22/2022 Chemotherapy   Patient is on Treatment Plan : OVARIAN Liposomal Doxorubicin q28d     11/12/2021 Echocardiogram   1. Left ventricular ejection fraction, by estimation, is 60 to 65%. The left ventricle has normal function. The left ventricle has no regional wall motion abnormalities. There is mild asymmetric left ventricular hypertrophy of the septal and anterior segments. Left ventricular diastolic parameters were normal.  2. Right ventricular systolic function is normal. The right ventricular size is normal.  3. The mitral valve is normal in structure. Mild mitral valve regurgitation.  4. The aortic valve is normal in structure. Aortic valve regurgitation is mild to moderate. No aortic stenosis is present.     02/06/2022 Imaging   1. Unchanged peritoneal thickening and nodularity, most  conspicuously in the right paracolic gutter, although also with extensive peritoneal thickening and nodularity in the low pelvis. Trace malignant ascites in the low pelvis. 2. Unchanged enlarged left external iliac lymph node. 3. Findings are consistent with stable metastatic disease. No evidence of new metastatic disease in the abdomen or pelvis. 4. Status post hysterectomy, oophorectomy, and omentectomy. 5. Chronic, long segment occlusion of the left common iliac, external iliac, and internal iliac arteries from the origin. Status post femoral femoral bypass graft.   04/10/2022 Echocardiogram    1. Left ventricular ejection fraction, by estimation, is 55 to 60%. The left ventricle has normal function. The left ventricle has no regional wall motion abnormalities. There is mild concentric left ventricular hypertrophy. Left ventricular diastolic parameters were normal. The average left ventricular global longitudinal strain is -13.2 %. The global longitudinal strain is abnormal.  2. Right ventricular systolic function is normal. The right ventricular size is normal. There is normal pulmonary artery systolic pressure. The estimated right ventricular systolic pressure is 22.0 mmHg.  3. The mitral valve is grossly normal. Mild mitral valve regurgitation. No evidence of mitral stenosis.  4. The aortic valve is tricuspid. Aortic valve regurgitation is mild. No aortic stenosis is present.  5. The inferior vena cava is normal in size with greater than 50% respiratory variability, suggesting right atrial pressure of 3 mmHg.   05/23/2022 Tumor Marker   Patient's tumor was tested for the following markers: CA-125. Results of the tumor marker test revealed 2037.   06/06/2022 Imaging   1. Status post hysterectomy and oophorectomy. Similar appearance of a left eccentric soft tissue mass in the low pelvis with extensive, more amorphous peritoneal thickening and soft tissue throughout the pelvis. 2. Unchanged  peritoneal thickening and nodularity elsewhere in the abdomen and pelvis. Trace malignant ascites. 3. Unchanged enlarged left external iliac and inguinal lymph nodes. 4. Unchanged small pulmonary nodules, most likely benign and incidental. 5. Status post hysterectomy, oophorectomy, omentectomy, and right hemicolectomy. 6. 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 7. Emphysema and diffuse bilateral bronchial wall thickening. 8. Coronary artery disease.       PHYSICAL EXAMINATION: ECOG PERFORMANCE STATUS: 1 - Symptomatic but completely ambulatory  Vitals:   06/06/22 1453  BP: (!) 160/88  Pulse: 89  Resp: 18  Temp: 98.2 F (36.8 C)  SpO2: 98%   Filed Weights   06/06/22 1453  Weight: 119 lb 3.2 oz (54.1 kg)    GENERAL:alert, no distress and comfortable.  She looks thin and cachectic NEURO: alert & oriented x 3 with fluent speech,  no focal motor/sensory deficits  LABORATORY DATA:  I have reviewed the data as listed    Component Value Date/Time   NA 141 05/22/2022 1217   NA 141 10/07/2017 1436   K 3.5 05/22/2022 1217   K 4.4 10/07/2017 1436   CL 106 05/22/2022 1217   CO2 29 05/22/2022 1217   CO2 26 10/07/2017 1436   GLUCOSE 121 (H) 05/22/2022 1217   GLUCOSE 85 10/07/2017 1436   BUN 16 05/22/2022 1217   BUN 14.9 10/07/2017 1436   CREATININE 0.91 05/22/2022 1217   CREATININE 1.0 10/07/2017 1436   CALCIUM 9.3 05/22/2022 1217   CALCIUM 9.5 10/07/2017 1436   PROT 6.9 05/22/2022 1217   PROT 7.8 10/07/2017 1436   ALBUMIN 4.0 05/22/2022 1217   ALBUMIN 3.9 10/07/2017 1436   AST 10 (L) 05/22/2022 1217   AST 15 10/07/2017 1436   ALT <5 05/22/2022 1217   ALT 7 10/07/2017 1436   ALKPHOS 51 05/22/2022 1217   ALKPHOS 81 10/07/2017 1436   BILITOT 0.4 05/22/2022 1217   BILITOT 0.54 10/07/2017 1436   GFRNONAA >60 05/22/2022 1217   GFRAA >60 08/15/2020 1121    No results found for:  "SPEP", "UPEP"  Lab Results  Component Value Date   WBC 3.9 (L) 05/22/2022   NEUTROABS 2.0 05/22/2022   HGB 9.5 (L) 05/22/2022   HCT 27.5 (L) 05/22/2022   MCV 100.7 (H) 05/22/2022   PLT 132 (L) 05/22/2022      Chemistry      Component Value Date/Time   NA 141 05/22/2022 1217   NA 141 10/07/2017 1436   K 3.5 05/22/2022 1217   K 4.4 10/07/2017 1436   CL 106 05/22/2022 1217   CO2 29 05/22/2022 1217   CO2 26 10/07/2017 1436   BUN 16 05/22/2022 1217   BUN 14.9 10/07/2017 1436   CREATININE 0.91 05/22/2022 1217   CREATININE 1.0 10/07/2017 1436      Component Value Date/Time   CALCIUM 9.3 05/22/2022 1217   CALCIUM 9.5 10/07/2017 1436   ALKPHOS 51 05/22/2022 1217   ALKPHOS 81 10/07/2017 1436   AST 10 (L) 05/22/2022 1217   AST 15 10/07/2017 1436   ALT <5 05/22/2022 1217   ALT 7 10/07/2017 1436   BILITOT 0.4 05/22/2022 1217   BILITOT 0.54 10/07/2017 1436       RADIOGRAPHIC STUDIES: I have reviewed CT imaging with the patient I have personally reviewed the radiological images as listed and agreed with the findings in the report. CT CHEST ABDOMEN PELVIS W CONTRAST  Result Date: 06/05/2022 CLINICAL DATA:  Ovarian cancer, assess treatment response, status post hysterectomy ongoing chemotherapy, additional history of colon cancer status post resection * Tracking Code: BO * EXAM: CT CHEST, ABDOMEN, AND PELVIS WITH CONTRAST TECHNIQUE: Multidetector CT imaging of the chest, abdomen and pelvis was performed following the standard protocol during bolus administration of intravenous contrast. RADIATION DOSE REDUCTION: This exam was performed according to the departmental dose-optimization program which includes automated exposure control, adjustment of the mA and/or kV according to patient size and/or use of iterative reconstruction technique. CONTRAST:  66m OMNIPAQUE IOHEXOL 300 MG/ML SOLN additional oral enteric contrast COMPARISON:  CT abdomen pelvis, 02/05/2022, CT chest, 01/28/2021  FINDINGS: CT CHEST FINDINGS Cardiovascular: Right chest port catheter. Aortic atherosclerosis. Normal heart size. Scattered left coronary artery calcifications. No pericardial effusion. Mediastinum/Nodes: No enlarged mediastinal, hilar, or axillary lymph nodes. Thyroid gland, trachea, and esophagus demonstrate no significant findings. Lungs/Pleura: Mild centrilobular emphysema. Diffuse bilateral bronchial  wall thickening. Unchanged 0.3 cm nodule of the central right upper lobe (series 5, image 54). Unchanged 0.3 cm nodule of the posterior left upper lobe (series 5, image 87). Unchanged 0.2 cm nodule of the left lower lobe (series 5, image 107). No pleural effusion or pneumothorax. Musculoskeletal: No chest wall mass or suspicious osseous lesions identified. CT ABDOMEN PELVIS FINDINGS Hepatobiliary: No solid liver abnormality is seen. No gallstones, gallbladder wall thickening, or biliary dilatation. Pancreas: Unremarkable. No pancreatic ductal dilatation or surrounding inflammatory changes. Spleen: Normal in size without significant abnormality. Adrenals/Urinary Tract: Adrenal glands are unremarkable. Kidneys are normal, without renal calculi, solid lesion, or hydronephrosis. Bladder is unremarkable. Stomach/Bowel: Stomach is within normal limits. Status post partial right hemicolectomy and reanastomosis. Status post omentectomy. No evidence of bowel wall thickening, distention, or inflammatory changes. Vascular/Lymphatic: Aortic atherosclerosis. 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 Unchanged enlarged left external iliac and inguinal lymph nodes, measuring up to 2.0 x 1.3 cm (series 2, image 106). Reproductive: Status post hysterectomy. Similar appearance of a left eccentric soft tissue mass in the low pelvis measuring 3.4 x 2.8 cm (series 2, image 103). Unchanged post treatment presacral soft tissue thickening and  fat stranding (series 2, image 106) Other: No abdominal wall hernia or abnormality. No ascites. Unchanged peritoneal thickening and nodularity, index nodule in the right paracolic gutter measuring 2.6 x 1.5 cm (series 2, image 87). Additional extensive peritoneal thickening throughout the low pelvis (series 2, image 105). Trace ascites in the low pelvis. Musculoskeletal: No acute osseous findings. IMPRESSION: 1. Status post hysterectomy and oophorectomy. Similar appearance of a left eccentric soft tissue mass in the low pelvis with extensive, more amorphous peritoneal thickening and soft tissue throughout the pelvis. 2. Unchanged peritoneal thickening and nodularity elsewhere in the abdomen and pelvis. Trace malignant ascites. 3. Unchanged enlarged left external iliac and inguinal lymph nodes. 4. Unchanged small pulmonary nodules, most likely benign and incidental. 5. Status post hysterectomy, oophorectomy, omentectomy, and right hemicolectomy. 6. 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 7. Emphysema and diffuse bilateral bronchial wall thickening. 8. Coronary artery disease. Aortic Atherosclerosis (ICD10-I70.0) and Emphysema (ICD10-J43.9). Electronically Signed   By: Delanna Ahmadi M.D.   On: 06/05/2022 21:55

## 2022-06-06 NOTE — Telephone Encounter (Signed)
Referral Placed to [facility] to establish cancer care. Chart information faxed.  Attn: [place] Pt Coordinator          Phone: [phone]          Fax:     [phone] Sent inbasket message for nurse to follow-up next business day.

## 2022-06-06 NOTE — Assessment & Plan Note (Addendum)
We reviewed pain management today She has worsening pain control She will continue current prescribed pain medicine along with addition of methadone

## 2022-06-06 NOTE — Assessment & Plan Note (Addendum)
We have numerous goals of care discussion in the past She is aware treatment goal is palliative We discussed importance of quality of life and being closer to family Ultimately, the patient has made informed decision to relocate back to [facility] in [place] to be closer to home

## 2022-06-06 NOTE — Assessment & Plan Note (Addendum)
This is due to her disease We discussed importance of frequent small meals

## 2022-06-08 ENCOUNTER — Other Ambulatory Visit: Payer: Self-pay | Admitting: Hematology and Oncology

## 2022-06-09 ENCOUNTER — Encounter: Payer: Self-pay | Admitting: Hematology and Oncology

## 2022-06-11 ENCOUNTER — Telehealth: Payer: Self-pay

## 2022-06-11 NOTE — Telephone Encounter (Signed)
Spoke with patient regarding recent move to [place], [place] and transition of care to [place]. Confirmed patient's new address and updated in the system. Patient will be mailed a medical release form for patient to sign to have records sent.  Patient verbalized an understanding of the plan and confirmed that she will return signed form to [facility]. Patient congratulated on recent move and knows to call should she have any additional questions or concerns.

## 2022-06-16 ENCOUNTER — Telehealth: Payer: Self-pay

## 2022-06-16 NOTE — Telephone Encounter (Signed)
Returned her call. She is following up on referral to [facility]. Called Harold Hedge new pt coordinator to follow up. Faxed referral to [phone], received fax confirmation. Told [place] referral faxed.

## 2022-06-24 ENCOUNTER — Other Ambulatory Visit: Payer: 59

## 2022-06-24 ENCOUNTER — Ambulatory Visit: Payer: 59 | Admitting: Hematology and Oncology

## 2022-06-24 ENCOUNTER — Ambulatory Visit: Payer: 59

## 2022-07-30 ENCOUNTER — Ambulatory Visit
Admit: 2022-07-30 | Discharge: 2022-07-30 | Payer: MEDICARE | Attending: Student in an Organized Health Care Education/Training Program | Primary: Student in an Organized Health Care Education/Training Program

## 2022-07-30 DIAGNOSIS — R110 Nausea: Secondary | ICD-10-CM

## 2022-07-30 DIAGNOSIS — C562 Malignant neoplasm of left ovary: Secondary | ICD-10-CM

## 2022-07-30 MED ORDER — ONDANSETRON HCL 4 MG PO TABS
4 MG | ORAL_TABLET | Freq: Three times a day (TID) | ORAL | 2 refills | Status: AC | PRN
Start: 2022-07-30 — End: ?

## 2022-07-30 NOTE — Progress Notes (Incomplete)
Sue [name] (DOB: [birth year]) is a 61 y.o. female, new patient, here for evaluation of the following chief complaint(s):  Establish Care (Stage 4 ovarian cancer. Pt states she had biopsy last week and she is still in pain.)       Assessment/Plan:  ***    No follow-ups on file.   {Picture!Picture!Picture!Picture!Picture!Picture!Picture!Picture!Picture!Picture!Picture!Picture!Picture!Picture!Picture!Picture!Picture!Picture!Picture!Picture!Picture!Picture!Picture!Picture!}  Subjective/Objective:  HPI  Establish Care- Living with husband and his family.     Oncology appt-        Physical Exam  Blood pressure 110/76, pulse 79, temperature 98.3 F (36.8 C), temperature source Tympanic, resp. rate 16, height 5\' 7"  (1.702 m), weight 114 lb 2 oz (51.8 kg), SpO2 97 %. Body mass index is 17.87 kg/m.  ***    Medical History- Reviewed Social History- Reviewed Surgical History- Reviewed   Sue [name] has no past medical history on file. Sue [name] reports that she has never smoked. She has never used smokeless tobacco. She reports current drug use. Drug: Marijuana Sue [name]). She reports that she does not drink alcohol. Sue [name] has no past surgical history on file.         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; Cancer associated pain; Cancer of right colon (HCC); Chronic hepatitis C without hepatic coma (HCC); Deficiency anemia; Epistaxis; Essential hypertension; Intussusception, ileocecal (HCC); Left leg pain; Lumbosacral plexopathy; Mass of pelvis; Nausea without vomiting; Neuropathy, idiopathic; Night sweats; Numbness of left foot; 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 [facility]); PAD (peripheral artery disease) (HCC); Pancytopenia, acquired (HCC); Physical debility; Postoperative anemia due to acute blood loss; Type II CRPS (complex regional pain syndrome); Vitamin D deficiency; and Weight loss, abnormal on their problem list.        Current Outpatient Medications   Medication Instructions    acetaminophen (TYLENOL) 325 mg, Oral, EVERY 6 HOURS PRN    atenolol (TENORMIN) 50 mg, Oral, DAILY    docusate (COLACE, DULCOLAX) 100 mg, Oral, 2 TIMES DAILY    doxazosin (CARDURA) 2 mg, Oral, DAILY    HYDROcodone-acetaminophen (NORCO) 10-325 MG per tablet 1 tablet, Oral, EVERY 6 HOURS PRN    lidocaine (LIDODERM) 5 % 1 patch, Topical, DAILY    lidocaine-prilocaine (EMLA) 2.5-2.5 % cream Topical, PRN    losartan (COZAAR) 100 mg, Oral, DAILY    LYRICA 200 MG capsule take 1 capsule by mouth every 8 hours    ondansetron (ZOFRAN) 8 mg, Oral, EVERY 8 HOURS PRN    prochlorperazine (COMPAZINE) 10 mg, Oral, EVERY 6 HOURS PRN       {Time Documentation:[id number]}    An electronic signature was used to authenticate this note.  -- Hermina Staggers, MD

## 2022-07-30 NOTE — Progress Notes (Signed)
Chief Complaint   Patient presents with    Establish Care     Stage 4 ovarian cancer. Pt states she had biopsy last week and she is still in pain.       1. "Have you been to the ER, urgent care clinic since your last visit?  Hospitalized since your last visit?" No    2. "Have you seen or consulted any other health care providers outside of the [facility] since your last visit?" No   3. For patients aged 61-75: Has the patient had a colonoscopy / FIT/ Cologuard? No      If the patient is female:    4. For patients aged 43-74: Has the patient had a mammogram within the past 2 years? No      5. For patients aged 21-65: Has the patient had a pap smear? NA - based on age or sex

## 2022-08-05 NOTE — Telephone Encounter (Signed)
Formatting of this note might be different from the original.  Called pt @ [phone] and lvm informing her she is scheduled for a new patient appt at [facility] on [address] on 08/07/22 at 9:00am with Dr. Diego Cory. Instructed pt to arrive 10-15 min early and to bring photo ID and insurance card.     ----- Message from Audree Camel, RN sent at 08/05/2022  9:32 AM EDT -----  Regarding: FW: another patient with [facility]  Hello,    Please see below. Pt needs to be scheduled with opthalmology ASAP for exam before she can start new chemo tx. (Requires eye exam with slit lamp). Urgent referral placed. Please assist with scheduling.     Melissa   ----- Message -----  From: Doreatha Lew, MD  Sent: 08/05/2022   8:22 AM EDT  To: Audree Camel, RN  Subject: RE: another patient with [facility]                  Yes please thank you   ----- Message -----  From: Audree Camel, RN  Sent: 08/05/2022   7:36 AM EDT  To: Doreatha Lew, MD  Subject: RE: another patient with [facility]                  She will need a referral to opthalmology as well to have slit lamp exam. Would you like for me to place referral?     The other pt has her appt tomorrow. I believe after that they will be working on getting her scheduled based on the results of the exam.     Thanks,  Melissa   ----- Message -----  From: Doreatha Lew, MD  Sent: 08/04/2022   4:26 PM EDT  To: Fanny Skates, PharmD; Audree Camel, RN; #  Subject: another patient with [facility] ordered [facility] for this patient as well. There aren't many options left. By the way, what is the status of [facility] for the other patient? Any updates?      Electronically signed by Hal Neer at 08/05/2022 10:08 AM EDT

## 2022-08-05 NOTE — Telephone Encounter (Signed)
Formatting of this note might be different from the original.  ----- Message from Doreatha Lew, MD sent at 08/04/2022  4:24 PM EDT -----  Regarding: another patient with [facility]  I ordered [facility] for this patient as well. There aren't many options left. By the way, what is the status of [facility] for the other patient? Any updates?    Electronically signed by Audree Camel, RN at 08/05/2022 10:39 AM EDT

## 2022-08-05 NOTE — Telephone Encounter (Signed)
Formatting of this note might be different from the original.  Pt scheduled for chemo ed on 9/8 at 310 with NP Chrys Racer.     Eye exam scheduled for 9/14.     Worksheet given to infusion scheduler.   Electronically signed by Audree Camel, RN at 08/05/2022 10:40 AM EDT

## 2022-08-14 NOTE — Telephone Encounter (Signed)
Summary: Medication Refill    Formatting of this note might be different from the original.  Patient called requesting a medication refill for [facility]. Pharmacy was verified with patient, [facility], [place].  Electronically signed by Harold Hedge, MA at 08/14/2022 10:58 AM EDT

## 2022-08-22 NOTE — Telephone Encounter (Signed)
Formatting of this note might be different from the original.  Patient calling stating she was seen in ER last night for abdominal pain. Patient states her pain has not gotten better and is starting to get worse and she keeps having to change clothes due to sweating/heat. Patient asking what she can do and states she does not want to return to [facility] ED. States she has a follow up with oncologist on Monday. Advised patient to follow ED discharge instructions to return to ED for fever and increased pain.    Return to ED for fever, vomiting, increasing pain or other  Concerns.    Patient verbalized understanding and states she will be going to a different ER.   Electronically signed by Pixie Casino, RN at 08/22/2022 10:32 PM EDT

## 2022-08-25 ENCOUNTER — Ambulatory Visit
Admit: 2022-08-25 | Payer: MEDICARE | Attending: Student in an Organized Health Care Education/Training Program | Primary: Student in an Organized Health Care Education/Training Program

## 2022-08-25 DIAGNOSIS — Z0000 Encounter for general adult medical examination without abnormal findings: Secondary | ICD-10-CM

## 2022-08-25 MED ORDER — METHYLPREDNISOLONE 4 MG PO TBPK
4 MG | ORAL_TABLET | ORAL | 0 refills | Status: AC
Start: 2022-08-25 — End: 2022-08-31

## 2022-08-25 NOTE — Progress Notes (Signed)
Medicare Annual Wellness Visit    Sue [name] is here for Medicare AWV and Follow-up (Patient says she would also like to do a follow up on her ER visit for her thigh injury. )    Assessment & Plan   1. Medicare annual wellness visit, subsequent  Limited role for some preventative interventions due to stage IV ovarian cancer receiving palliative treatments.  Working on weight, fall risk and pain as below.  Further information given in AVS.  I believe that further testing such as colonoscopy and mammography will cause more burden than benefit.    2. Pain of left hip  Overlapping symptoms of muscular strain, arthritis, potential AVN of the left hip (awaiting final MRI read).  Using chronic opiates from oncology with minimal benefit.  We will try Medrol Dosepak to help acute symptoms.  Rest as able.  Following final MRI read, if just the muscular strain we will proceed with conservative symptomatic therapies.  If more related due to arthritis or AVN, will get Ortho on board.  I have personally reviewed her results for Hip CT, MRI prelim, CBC and BMP from the ED.    - methylPREDNISolone (MEDROL, PAK,) 4 MG tablet; Take by mouth.  Dispense: 21 tablet; Refill: 0      Recommendations for Preventive Services Due: see orders and patient instructions/AVS.  Recommended screening schedule for the next 5-10 years is provided to the patient in written form: see Patient Instructions/AVS.     Return in about 6 weeks (around 10/06/2022) for routine check up.     Subjective   The following acute and/or chronic problems were also addressed today:      -L thigh- intner thigh pain. Pt had been using her legs more due being in the hospital with her husband who is in there for a MI.     Ovarian Cancer- Upcoming class before starting new chemo.   - Oncologist giving Norco 5 up to QID. Reportedly recommended not take NSAIDs by oncology      Patient's complete Health Risk Assessment and screening values have been reviewed and are found  in Flowsheets. The following problems were reviewed today and where indicated follow up appointments were made and/or referrals ordered.    Positive Risk Factor Screenings with Interventions:    Fall Risk:  Do you feel unsteady or are you worried about falling? : (!) yes  2 or more falls in past year?: no  Fall with injury in past year?: no     Interventions:    See AVS for additional education material    Cognitive:   Words recalled: 3 Words Recalled   Clock Drawing Test (CDT): (!) Abnormal   Total Score: 3   Total Score Interpretation: Normal Mini-Cog      Interventions:  Repeated clock draw was normal. No concern for cognitive deficits today.       Drug Use:          Interpretation:  1-2: Low level - Monitor, re-assess at a later date  3-5: Moderate level - Further Investigation  6-8: Substantial level - Intensive Assessment  9-10: Severe level - Intensive Assessment    Interventions:  Using appropriately as prescribed by oncology for significant cancer related pain.         Opioid Risk: (Low risk score <55) Opioid risk score: 33    Patient is low risk for opioid use disorder or overdose.  Last PDMP El[place] as Reviewed:  Review User Review Instant Review Result  Gilberta Peeters 08/25/2022 10:45 AM     Reviewed PDMP [1]       General HRA Questions:  Select all that apply: (!) New or Increased Pain, Stress    Pain Interventions:  New prescription today as above.    Stress Interventions:  Difficult situation given recent move and husband's health problems in addition to her own.  Working through coping mechanisms and supporting her self as she also supports others.       Weight and Activity:  Physical Activity: Inactive (08/25/2022)    Exercise Vital Sign     Days of Exercise per Week: 0 days     Minutes of Exercise per Session: 0 min     On average, how many days per week do you engage in moderate to strenuous exercise (like a brisk walk)?: 0 days  Have you lost any weight without trying in the past 3 months?: (!)  Yes  Body mass index is 18.01 kg/m. (!) Abnormal    Inactivity Interventions:  See AVS for additional education material    Unintentional Weight Loss Interventions:  Continuing nutrient supplements including Ensure.  Underweight Interventions:  See AVS for additional education material        Vision Screen:  Do you have difficulty driving, watching TV, or doing any of your daily activities because of your eyesight?: No  Have you had an eye exam within the past year?: (!) No (Scheduled on tomorrow)  No results found.    Interventions:   Patient encouraged to make appointment with their eye specialist                        Objective   Vitals:    08/25/22 1008   BP: (!) 154/88   Site: Left Upper Arm   Position: Sitting   Pulse: 68   Temp: 97.8 F (36.6 C)   TempSrc: Tympanic   SpO2: 98%   Weight: 115 lb (52.2 kg)   Height: '5\' 7"'$  (1.702 m)      Body mass index is 18.01 kg/m.    General appearance - Alert, NAD, normal appearance  Head - Atraumatic. Normocephalic.   Eyes - EOMI. Sclera white.   Ears - Hearing grossly normal.    Respiratory - LCTAB. Effort normal, without respiratory distress. No wheeze/rale/rhonchi  Heart - Normal rate, regular rhythm. No m/r/r  Neurological - No gross focal deficits. Speech normal. Alert and oriented. Mental status is at baseline.   Musculoskeletal -left hip pain to greater trochanter, left glued.  Full range of motion to left hip to flexion, extension, abduction, abduction-however very slow and limited by pain.  Grossly normal ROM, Gait normal.    Skin - normal coloration and normal turgor.        Allergies   Allergen Reactions    Penicillins Other (See Comments)     Other reaction(s): Other (See Comments)  Other reaction(s): Other (See Comments)  Childhood allergy  Childhood allergy  Childhood allergy      Hydrocodone Hives    Meperidine Hcl Hives    Acetaminophen Nausea And Vomiting     Other reaction(s): GI intolerance  Nausea and Vomiting      Ibuprofen Nausea And Vomiting      Other reaction(s): GI intolerance  Nausea and vomiting      Meperidine Nausea And Vomiting     Other reaction(s): GI intolerance  Nausea/Vomiting      Morphine And Related Nausea And Vomiting  Other reaction(s): GI intolerance, Not available  Nausea/Vomiting      Oxycodone Itching and Hives    Strawberry Itching     Fruit and strawberry flavoring    Strawberry Flavor Itching     Mrs Thursby reports to [facility] RN CM she is allergic to [place] and anything strawberry flavored   Mrs Boateng reports to [facility] RN CM she is allergic to [place] and anything strawberry flavored        Prior to Visit Medications    Medication Sig Taking? Authorizing Provider   methylPREDNISolone (MEDROL, PAK,) 4 MG tablet Take by mouth. Yes Vivianne Master, MD   docusate (COLACE, DULCOLAX) 100 MG CAPS Take 100 mg by mouth 2 times daily Yes Historical Provider, MD   doxazosin (CARDURA) 2 MG tablet Take 1 tablet by mouth daily Yes Historical Provider, MD   HYDROcodone-acetaminophen (NORCO) 10-325 MG per tablet Take 1 tablet by mouth every 6 hours as needed. Yes Historical Provider, MD   lidocaine (LIDODERM) 5 % Apply 1 patch topically daily Yes Historical Provider, MD   lidocaine-prilocaine (EMLA) 2.5-2.5 % cream Apply topically as needed Yes Historical Provider, MD   losartan (COZAAR) 100 MG tablet Take 1 tablet by mouth daily Yes Historical Provider, MD   LYRICA 200 MG capsule take 1 capsule by mouth every 8 hours Yes Historical Provider, MD   prochlorperazine (COMPAZINE) 10 MG tablet Take 1 tablet by mouth every 6 hours as needed Yes Historical Provider, MD   ondansetron (ZOFRAN) 4 MG tablet Take 1 tablet by mouth every 8 hours as needed for Nausea Yes Vivianne Master, MD   acetaminophen (TYLENOL) 325 MG tablet Take 1 tablet by mouth every 6 hours as needed  Patient not taking: Reported on 08/25/2022  Historical Provider, MD   atenolol (TENORMIN) 50 MG tablet Take 1 tablet by mouth daily  Patient not taking: Reported on 08/25/2022  Historical  Provider, MD       CareTeam (Including outside providers/suppliers regularly involved in providing care):   Patient Care Team:  Vivianne Master, MD as PCP - General (Family Medicine)  Vivianne Master, MD as PCP - Empaneled Provider  Doreatha Lew, MD (Hematology)     Reviewed and updated this visit:  Tobacco  Allergies  Meds  Med Hx  Surg Hx  Soc Hx  Fam Hx

## 2022-08-25 NOTE — Patient Instructions (Signed)
Preventing Falls: Care Instructions    Talk to your doctor about the medicines you take. Ask if any of them increase the risk of falls and whether they can be changed or stopped.   Try to exercise regularly. It can help improve your strength and balance. This can help lower your risk of falling.     Practice fall safety and prevention.    Wear low-heeled shoes that fit well and give your feet good support. Talk to your doctor if you have foot problems that make this hard.  Carry a cellphone or wear a medical alert device that you can use to call for help.  Use stepladders instead of chairs to reach high objects. Don't climb if you're at risk for falls. Ask for help, if needed.  Wear the correct eyeglasses, if you need them.    Make your home safer.    Remove rugs, cords, clutter, and furniture from walkways.  Keep your house well lit. Use night-lights in hallways and bathrooms.  Install and use sturdy handrails on stairways.  Wear nonskid footwear, even inside. Don't walk barefoot or in socks without shoes.    Be safe outside.    Use handrails, curb cuts, and ramps whenever possible.  Keep your hands free by using a shoulder bag or backpack.  Try to walk in well-lit areas. Watch out for uneven ground, changes in pavement, and debris.  Be careful in the winter. Walk on the grass or gravel when sidewalks are slippery. Use de-icer on steps and walkways. Add non-slip devices to shoes.    Put grab bars and nonskid mats in your shower or tub and near the toilet. Try to use a shower chair or bath bench when bathing.   Get into a tub or shower by putting in your weaker leg first. Get out with your strong side first. Have a phone or medical alert device in the bathroom with you.   Where can you learn more?  Go to https://www.bennett.info/ and enter G117 to learn more about "Preventing Falls: Care Instructions."  Current as of: July 18, 2023Content Version: 13.8   2006-2023 Healthwise,  Incorporated.   Care instructions adapted under license by [facility]. If you have questions about a medical condition or this instruction, always ask your healthcare professional. [place] any warranty or liability for your use of this information.           Learning About Mild Cognitive Impairment (MCI)  What is mild cognitive impairment (MCI)?     It's common to forget things sometimes as we get older. But some older people have memory loss that's more than normal aging. It's called mild cognitive impairment, or MCI. It is not the same as dementia.  People with the condition often know that their memory or mental function has changed. Tests may show some loss. But their minds work well overall. They can carry out daily tasks that are normal for them.  People with MCI have a higher chance of one day getting dementia. But not all people who have it will get dementia. Some people may stay the same over time.  What are the symptoms?  People with MCI have more memory loss than what occurs with normal aging. They may have increasing trouble with recalling words and keeping up with conversations. They may also have trouble remembering important events and making decisions.  What puts you at risk?  The risk of getting MCI increases with age. Having high blood pressure  or having a family history of MCI may also increase your risk.  How is it diagnosed?  Your doctor will do a physical exam.  You may be asked questions to check your memory and other mental skills. Your doctor may also talk to close friends and family members. This can help the doctor figure out how your memory and other mental skills have changed.  You may get blood tests and tests that look at your brain.  These questions and tests can make sure you don't have other conditions that can cause symptoms like MCI. These include depression, sleep problems, and side effects from medicines.  How is it treated?  There are no medicines  to treat MCI or to keep it from progressing to dementia. But treating conditions like high blood pressure and diabetes may help. A person with MCI needs routine follow-up visits with their doctor to check on changes in the person's mental skills.  How can you care for yourself at home?  Keeping your body active can help slow MCI. Exercises like walking can help. Try to stay active mentally too. Read or do things like crossword puzzles if you enjoy doing them.  If you need help coping with MCI, you may want to get support from family, friends, a support group, or a counselor who works with people who have [place].  Though the future isn't always clear, it can be good to plan ahead with instructions for your care. These are called advanced directives. Having a plan can help make sure that you get the care you want.  Current as of: May 1, 2023Content Version: 13.8   2006-2023 Healthwise, Incorporated.   Care instructions adapted under license by [facility]. If you have questions about a medical condition or this instruction, always ask your healthcare professional. [place] any warranty or liability for your use of this information.           Learning About Stress  What is stress?     Stress is your body's response to a hard situation. Your body can have a physical, emotional, or mental response. Stress is a fact of life for most people, and it affects everyone differently. What causes stress for you may not be stressful for someone else.  A lot of things can cause stress. You may feel stress when you go on a job interview, take a test, or run a race. This kind of short-term stress is normal and even useful. It can help you if you need to work hard or react quickly. For example, stress can help you finish an important job on time.  Long-term stress is caused by ongoing stressful situations or events. Examples of long-term stress include long-term health problems, ongoing problems  at work, or conflicts in your family. Long-term stress can harm your health.  How does stress affect your health?  When you are stressed, your body responds as though you are in danger. It makes hormones that speed up your heart, make you breathe faster, and give you a burst of energy. This is called the fight-or-flight stress response. If the stress is over quickly, your body goes back to normal and no harm is done.  But if stress happens too often or lasts too long, it can have bad effects. Long-term stress can make you more likely to get sick, and it can make symptoms of some diseases worse. If you tense up when you are stressed, you may develop neck, shoulder, or low back  pain. Stress is linked to high blood pressure and heart disease.  Stress also harms your emotional health. It can make you moody, tense, or depressed. Your relationships may suffer, and you may not do well at work or school.  What can you do to manage stress?  You can try these things to help manage stress:   Do something active. Exercise or activity can help reduce stress. Walking is a great way to get started. Even everyday activities such as housecleaning or yard work can help.  Try yoga or tai chi. These techniques combine exercise and meditation. You may need some training at first to learn them.  Do something you enjoy. For example, listen to music or go to a movie. Practice your hobby or do volunteer work.  Meditate. This can help you relax, because you are not worrying about what happened before or what may happen in the future.  Do guided imagery. Imagine yourself in any setting that helps you feel calm. You can use online videos, books, or a teacher to guide you.  Do breathing exercises. For example:  From a standing position, bend forward from the waist with your knees slightly bent. Let your arms dangle close to the floor.  Breathe in slowly and deeply as you return to a standing position. Roll up slowly and lift your head  last.  Hold your breath for just a few seconds in the standing position.  Breathe out slowly and bend forward from the waist.  Let your feelings out. Talk, laugh, cry, and express anger when you need to. Talking with supportive friends or family, a Social worker, or a faith leader about your feelings is a healthy way to relieve stress. Avoid discussing your feelings with people who make you feel worse.  Write. It may help to write about things that are bothering you. This helps you find out how much stress you feel and what is causing it. When you know this, you can find better ways to cope.  What can you do to prevent stress?  You might try some of these things to help prevent stress:  Manage your time. This helps you find time to do the things you want and need to do.  Get enough sleep. Your body recovers from the stresses of the day while you are sleeping.  Get support. Your family, friends, and community can make a difference in how you experience stress.  Limit your news feed. Avoid or limit time on social media or news that may make you feel stressed.  Do something active. Exercise or activity can help reduce stress. Walking is a great way to get started.  Where can you learn more?  Go to https://www.bennett.info/ and enter N032 to learn more about "Learning About Stress."  Current as of: February 26, 2023Content Version: 13.8   2006-2023 Healthwise, Incorporated.   Care instructions adapted under license by [facility]. If you have questions about a medical condition or this instruction, always ask your healthcare professional. [place] any warranty or liability for your use of this information.           Learning About Vision Tests  What are vision tests?     The four most common vision tests are visual acuity tests, refraction, visual field tests, and color vision tests.  Visual acuity (sharpness) tests  These tests are used:  To see if you need glasses or  contact lenses.  To monitor an eye problem.  To check an  eye injury.  Visual acuity tests are done as part of routine exams. You may also have this test when you get your driver's license or apply for some types of jobs.  Visual field tests  These tests are used:  To check for vision loss in any area of your range of vision.  To screen for certain eye diseases.  To look for nerve damage after a stroke, head injury, or other problem that could reduce blood flow to the brain.  Refraction and color tests  A refraction test is done to find the right prescription for glasses and contact lenses.  A color vision test is done to check for color blindness.  Color vision is often tested as part of a routine exam. You may also have this test when you apply for a job where recognizing different colors is important, such as truck driving, Research officer, trade union, or the [employer].  How are vision tests done?  Visual acuity test   You cover one eye at a time.  You read aloud from a wall chart across the room.  You read aloud from a small card that you hold in your hand.  Refraction   You look into a special device.  The device puts lenses of different strengths in front of each eye to see how strong your glasses or contact lenses need to be.  Visual field tests   Your doctor may have you look through special machines.  Or your doctor may simply have you stare straight ahead while they move a finger into and out of your field of vision.  Color vision test   You look at pieces of printed test patterns in various colors. You say what number or symbol you see.  Your doctor may have you trace the number or symbol using a pointer.  How do these tests feel?  There is very little chance of having a problem from this test. If dilating drops are used for a vision test, they may make the eyes sting and cause a medicine taste in the mouth.  Follow-up care is a key part of your treatment and safety. Be sure to make and go to all appointments, and call  your doctor if you are having problems. It's also a good idea to know your test results and keep a list of the medicines you take.  Where can you learn more?  Go to https://www.bennett.info/ and enter G551 to learn more about "Learning About Vision Tests."  Current as of: June 6, 2023Content Version: 13.8   2006-2023 Healthwise, Incorporated.   Care instructions adapted under license by [facility]. If you have questions about a medical condition or this instruction, always ask your healthcare professional. [place] any warranty or liability for your use of this information.           A Healthy Heart: Care Instructions  Your Care Instructions     Coronary artery disease, also called heart disease, occurs when a substance called plaque builds up in the vessels that supply oxygen-rich blood to your heart muscle. This can narrow the blood vessels and reduce blood flow. A heart attack happens when blood flow is completely blocked. A high-fat diet, smoking, and other factors increase the risk of heart disease.  Your doctor has found that you have a chance of having heart disease. You can do lots of things to keep your heart healthy. It may not be easy, but you can change your diet, exercise more, and quit  smoking. These steps really work to lower your chance of heart disease.  Follow-up care is a key part of your treatment and safety. Be sure to make and go to all appointments, and call your doctor if you are having problems. It's also a good idea to know your test results and keep a list of the medicines you take.  How can you care for yourself at home?  Diet   Use less salt when you cook and eat. This helps lower your blood pressure. Taste food before salting. Add only a little salt when you think you need it. With time, your taste buds will adjust to less salt.    Eat fewer snack items, fast foods, canned soups, and other high-salt, high-fat, processed foods.     Read food labels and try to avoid saturated and trans fats. They increase your risk of heart disease by raising cholesterol levels.    Limit the amount of solid fat-butter, margarine, and shortening-you eat. Use olive, peanut, or canola oil when you cook. Bake, broil, and steam foods instead of frying them.    Eat a variety of fruit and vegetables every day. Dark green, deep orange, red, or yellow fruits and vegetables are especially good for you. Examples include spinach, carrots, peaches, and berries.    Foods high in fiber can reduce your cholesterol and provide important vitamins and minerals. High-fiber foods include whole-grain cereals and breads, oatmeal, beans, brown rice, citrus fruits, and apples.    Eat lean proteins. Heart-healthy proteins include seafood, lean meats and poultry, eggs, beans, peas, nuts, seeds, and soy products.    Limit drinks and foods with added sugar. These include candy, desserts, and soda pop.   Lifestyle changes   If your doctor recommends it, get more exercise. Walking is a good choice. Bit by bit, increase the amount you walk every day. Try for at least 30 minutes on most days of the week. You also may want to swim, bike, or do other activities.    Do not smoke. If you need help quitting, talk to your doctor about stop-smoking programs and medicines. These can increase your chances of quitting for good. Quitting smoking may be the most important step you can take to protect your heart. It is never too late to quit.    Limit alcohol to 2 drinks a day for men and 1 drink a day for women. Too much alcohol can cause health problems.    Manage other health problems such as diabetes, high blood pressure, and high cholesterol. If you think you may have a problem with alcohol or drug use, talk to your doctor.   Medicines   Take your medicines exactly as prescribed. Call your doctor if you think you are having a problem with your medicine.    If your doctor recommends aspirin,  take the amount directed each day. Make sure you take aspirin and not another kind of pain reliever, such as acetaminophen (Tylenol).   When should you call for help?   Call 911 if you have symptoms of a heart attack. These may include:   Chest pain or pressure, or a strange feeling in the chest.    Sweating.    Shortness of breath.    Pain, pressure, or a strange feeling in the back, neck, jaw, or upper belly or in one or both shoulders or arms.    Lightheadedness or sudden weakness.    A fast or irregular heartbeat.   After you  call 911, the operator may tell you to chew 1 adult-strength or 2 to 4 low-dose aspirin. Wait for an ambulance. Do not try to drive yourself.  Watch closely for changes in your health, and be sure to contact your doctor if you have any problems.  Where can you learn more?  Go to https://www.bennett.info/ and enter F075 to learn more about "A Healthy Heart: Care Instructions."  Current as of: June 25, 2023Content Version: 13.8   2006-2023 Healthwise, Incorporated.   Care instructions adapted under license by [facility]. If you have questions about a medical condition or this instruction, always ask your healthcare professional. [place] any warranty or liability for your use of this information.      Personalized Preventive Plan for Sue [name] - 08/25/2022  Medicare offers a range of preventive health benefits. Some of the tests and screenings are paid in full while other may be subject to a deductible, co-insurance, and/or copay.    Some of these benefits include a comprehensive review of your medical history including lifestyle, illnesses that may run in your family, and various assessments and screenings as appropriate.    After reviewing your medical record and screening and assessments performed today your provider may have ordered immunizations, labs, imaging, and/or referrals for you.  A list of these orders (if  applicable) as well as your Preventive Care list are included within your After Visit Summary for your review.    Other Preventive Recommendations:    A preventive eye exam performed by an eye specialist is recommended every 1-2 years to screen for glaucoma; cataracts, macular degeneration, and other eye disorders.  A preventive dental visit is recommended every 6 months.  Try to get at least 150 minutes of exercise per week or 10,000 steps per day on a pedometer .  Order or download the FREE "Exercise & Physical Activity: Your Everyday Guide" from [employer]. Call (531)715-4387 or search [employer] online.  You need 1200-1500 mg of calcium and 1000-2000 IU of vitamin D per day. It is possible to meet your calcium requirement with diet alone, but a vitamin D supplement is usually necessary to meet this goal.  When exposed to the sun, use a sunscreen that protects against both UVA and UVB radiation with an SPF of 30 or greater. Reapply every 2 to 3 hours or after sweating, drying off with a towel, or swimming.  Always wear a seat belt when traveling in a car. Always wear a helmet when riding a bicycle or motorcycle.

## 2022-08-25 NOTE — Progress Notes (Signed)
Chief Complaint   Patient presents with    Medicare AWV    Follow-up     Patient says she would also like to do a follow up on her ER visit for her thigh injury.        "Have you been to the ER, urgent care clinic since your last visit?  Hospitalized since your last visit?"    YES - When: approximately 2 days ago.  Where and Why: [place], [place].    "Have you seen or consulted any other health care providers outside of [place] since your last visit?"    NO    "Have you had a colorectal cancer screening such as a colonoscopy/FIT/Cologuard?    YES - Type: Colonoscopy.  When: within the last year.  Where: [facility]. Patient states she has signed a release for this..  Nurse/CMA to request records if not in the chart        "Have you had a pap smear?"    YES - When: within the last year.  Where: [facility], patient states she has signed a release for this.   Nurse/CMA to request records if not in the chart

## 2022-08-26 ENCOUNTER — Other Ambulatory Visit: Payer: Self-pay | Admitting: Hematology and Oncology

## 2022-08-26 NOTE — Progress Notes (Signed)
Formatting of this note might be different from the original.  1. Ovarian CA, unspecified laterality (CMS/HCC)  - no ocular manifestations at this time. Retina is clear and intact OU no evidence of metastatic lesions.  - patient will be started on topical steroids in each eye as part of her treatment plan  - IOP in both eyes normal , will monitor two weeks after her second chemo treatment  - no evidence of Glaucoma at this time.   - Ambulatory referral to Ophthalmology    2. Age-related nuclear cataract of both eyes  Not visually significant, pre-surgical. Monitor annually or sooner if patient experiences changes in vision.    3. Hyperopia with astigmatism and presbyopia, bilateral  Patient happy with just over the counter readers.      There are no Patient Instructions on file for this visit.     Return in about 6 weeks (around 10/07/2022). RTC sooner if vision/ocular concerns.     Electronically Signed By: Diego Cory, OD 08/26/2022 12:04 PM      Electronically signed by Diego Cory, OD at 08/26/2022 12:06 PM EDT

## 2022-08-28 NOTE — Progress Notes (Signed)
Formatting of this note is different from the original.  [facility]AT[facility]Y CONSULTATION    ASSESSMENT/PLAN:  1. 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     - Periodic surveillance CT scans unremarkable until 05/24/2019 when new peritoneal carcinomatosis was seen.     - Treatments:       - 6 cycles of Carboplatin + Taxol (06/20/19 - 11/14/19).       - Maintenance Olaparib started 12/15/19 but led to disease progression.       - Second-line treatment with Carbo/Gem and Bevacizumab (02/11/21 - 06/06/21).       - Bevacizumab alone (07/16/21 - 09/27/21); disease progression confirmed on 10/29/2021.       - Received liposomal doxorubicin between 11/12/21 - 05/22/22.     - Most recent scan on 06/06/2022: Post hysterectomy and oophorectomy status with a similar soft tissue mass in the low pelvis.     - CA-125 levels steadily rising, reached 2037 on 05/23/22.     - Discussed single-agent chemotherapy with Taxotere versus hospice; patient declined hospice, stating she's "not ready to give up."     - Baseline CT C/A/P (07/10/22) revealed significant disease in the abdomen-pelvis without frank evidence of pulmonary involvement.     - CT-guided biopsy of right lower quadrant nodule (07/25/22) consistent with adenocarcinoma.     - Patient has >90% of folate receptor alpha (FR?) antibody and considered for mirvetuximab soravtansine (ELAHERE). Discussed potential adverse effects including keratopathy and other ophthalmic complications.    2. Toxicity Monitoring     - Baseline ophthalmology exam completed.     - Will start C1 on 09/03/22.    3. Stage III Adenocarcinoma of Right Colon     - 09/18/2014: Right hemicolectomy and terminal ileoectomy revealed adenocarcinoma of the right colon, pT3N1Mx.     - Patient undergoing regular CT scan  reevaluations for ovarian cancer.    4. Cancer-Related Pain     - Reports worsened pain after biopsy.     - Norco every 6 hours.     - Palliative referral placed.    5. History of AVN of Femoral Heads     - [facility] ER evaluation (08/23/22) and MRI showed Bilateral AVN of the femoral heads, left worse than right. Advanced degenerative changes in both hips. No occult fracture. Small left hip effusion.     - Continue Lyrica 21m BID.     - Continue Norco.    6. Malnutrition     - Oncology RD evaluation. Continue ensure and snacks       - RTC in 4 weeks.    ONCOLOGY HISTORY  1. Stage III Adenocarcinoma of Right Colon:     - 09/16/2014: CT AP shows a 3.8 cm cecal mass with associated ileocecal intussusception and a 9.1 cm solid and cystic mass in the left pelvis.     - 09/18/2014: Right hemicolectomy and terminal ileoectomy reveal adenocarcinoma of the right colon with certain characteristics and negative nodes. Pathologic stage: pT3N1Mx.     - 08/06/2016: CT C/AP shows no recurrent malignancy, stable occlusion of left common iliac artery, and pulmonary nodules.     - 03/08/2017: CT C/AP shows no evidence of recurrent malignancy or metastatic disease.     - 11/13/2017: CT AP reveals no findings of recurrent malignancy and mentions other imaging findings.     -  10/22/2020: CT chest shows reduced size of nodules along the right paracolic gutter, compatible with treatment response.     - 01/28/2021: CT C/AP shows changes related to right hemicolectomy and presence of new low-density nodules.     - 06/18/2021: CT C/AP indicates subtle increase in peritoneal thickening, stable appearance of fascial thickening, and avascular necrosis.     - 10/29/2021: CT AP suggests evidence of progression of peritoneal metastasis and an increase in peritoneal thickening.     - 02/06/2022: CT AP reveals unchanged peritoneal thickening and nodularity with stable metastatic disease.     - 06/06/2022: CT AP shows unchanged peritoneal thickening  and nodularity, stable metastatic disease, and no evidence of new metastatic disease.    2. Stage IV Ovarian Cancer with Mixed Endometrioid and Clear Cell:  PDL1 20% , FOLR1 90%, ER(90%), PR(2%),MMRp  - 10/09/2014: CT AP identifies a 9.1 cm solid and cystic mass in the left pelvis.  - 11/01/2014: Salpingo-oophorectomy reveals mixed endometrioid and clear cell carcinoma.  - 03/12/2017: R breast nipple biopsy confirms benign tissue.  - 03/22/2018: Bilateral Screening MMG shows no evidence of malignancy.  - 05/24/2019: CT AP reports new peritoneal carcinomatosis in the pelvis and right lower quadrant.  - 06/06/2019: CT Chest shows stable bilateral pulmonary nodules and aortic atherosclerosis.  - 06/07/2019: Peritoneal nodule soft tissue biopsy indicates metastatic carcinoma consistent with ovarian carcinoma.  - 08/25/2019: CT C/AP shows slight interval decrease in the size of the peritoneal nodule.  - 11/29/2019: CT C/AP shows a decreased right lower quadrant peritoneal implant and stable aortic atherosclerosis.  - 04/19/2020: CT AP indicates continued slight interval decrease in the size of a soft tissue nodule and no evidence of new metastatic disease.  - 10/22/2020: CT chest shows a reduced size of a nodule along the right paracolic gutter and mild reduction in a band-like density along the rectosigmoid mesentery.  - 01/28/2021: CT C/AP reveals changes related to right hemicolectomy and new well-circumscribed low-density nodules.  - 06/18/2021: CT C/AP shows nodular fascial thickening with a subtle increase along the right pericolic gutter.  - 10/29/2021: CT AP indicates evidence of progression of peritoneal metastasis.  - 02/06/2022: CT AP shows unchanged peritoneal thickening and nodularity, stable metastatic disease, and no evidence of new metastatic disease.  - 06/06/2022: CT AP reveals status post hysterectomy and oophorectomy, with a similar appearance of a soft tissue mass in the low pelvis.  - 07/10/2022: CT  C/AP showed 2 small noncalcified pulmonary nodules. Severe wall thickening of the rectosigmoid junction with adjacent fat stranding. This may represent sequelae of colonic neoplasm versus drop metastases from reported ovarian cancer. Soft tissue nodule within the inferior left colic gutter which may represent a metastatic implant. Borderline retroperitoneal adenopathy. Complete occlusion of the left iliac and femoral arteries with femoral jump graft in place.    TREATMENT  - 06/20/19 - 11/14/19: Completed 6 cycles of Carboplatin + Taxol.  - 12/15/19: Sue [name] but disease progressed.  - 02/11/21 - 06/06/21: Received Carbo/gem and Avastin but disease progressed.  - 07/16/21 - 09/27/21: Received Ovarian Bevacizumab but disease progressed.  - 11/12/21 - 05/22/22: Received Ovarian Lipsomal Doxorubicin.    CA-125 Levels:  - 05/02/2019: 360  - 06/16/2019: 499  - 07/11/2019: 438  - 08/05/2019: 272  - 08/26/2019: 189  - 09/26/2019: 137  - 10/03/2019: 137  - 10/31/2019: 101  - 12/23/2019: 55.9  - 01/02/2020: 58.6  - 02/03/2020: 52.2  - 02/24/2020: 64.8  - 08/15/2020: 47.1  -  10/23/2020: 74.6  - 01/21/2021: 825  - 02/18/2021: 852  - 03/04/2021: 541  - 03/11/2021: 568  - 03/25/2021: 367  - 04/08/2021: 273  - 04/29/2021: 431  - 06/06/2021: 594  - 07/17/2021: 996  - 08/06/2021: 975  - 05/23/2022: 2037  - 07/31/2022: 5652    History of Present Illness:  Sue [name] is a very pleasant 61 year old woman with history of hypertension, hepatitis C virus (treated), peripheral arterial disease and a prior fem-fem bypass graft presents for evaluation and management of colon and ovarian cancer.    The patient's medical history indicates Stage IV ovarian cancer with mixed endometrioid and clear cell carcinoma. The initial diagnosis on 10/09/2014 revealed a 9.1 cm mass in the left pelvis via CT scan, confirmed by salpingo-oophorectomy on 11/01/2014. Periodic surveillance CT scans showed no significant findings until 05/24/2019 when  new peritoneal carcinomatosis was observed. Subsequently, she underwent treatment with 6 cycles of Carboplatin + Taxol from 06/20/19 to 11/14/19. Maintenance Olaparib was initiated on 12/15/19 but led to disease progression. She then received second-line Carbo/gem and Bevacizumab from 02/11/21 to 06/06/21, and Bevacizumab from 07/16/21 to 09/27/21, but disease progression was confirmed on 10/29/2021.     Received liposomal doxorubicin between12/20/22 - 05/22/22. The most recent scan on 06/06/2022 indicated a post hysterectomy and oophorectomy status with a similar soft tissue mass in the lower pelvis. The patient's CA-125 levels have been steadily rising and reached 2037 on 05/23/22. Despite the challenging situation, the patient is seeking active treatment and was offered Taxotere versus hospice, opting for active treatment as she stated she's "not ready to give up." She [place] to her hometown to live near family and friends, providing strong social support. She is using medical marijuana for anxiety and pain management.    INTERIM HISTORY  - Patient returns for follow-up.  - Today, she reports feeling well.  - However, she was evaluated at the ER on 08/23/22 for significant left hip pain. MRI showed Bilateral AVN of the femoral heads, left worse than right. Advanced degenerative changes in both hips. No occult fracture. Small left hip effusion.  - She has been taking Norco and Lyrica with good effect.  - Reports abdominal discomfort.  - She has lost weight but reports a good appetite and supplements with Ensure and breakfast snacks.    Past Medical History:   Diagnosis Date   ? Colon cancer (CMS/HCC)    ? Hepatitis C infection     treated 2015   ? Hypertension    ? Ovarian cancer (CMS/HCC)    ? PAD (peripheral artery disease) (CMS/HCC)      Past Surgical History:   Procedure Laterality Date   ? COLON SURGERY     ? OVARY SURGERY         No family history on file.     Social History     Socioeconomic History   ?  Marital status: Married   Tobacco Use   ? Smoking status: Never   ? Smokeless tobacco: Never   Vaping Use   ? Vaping Use: Never used   Substance and Sexual Activity   ? Alcohol use: Not Currently   ? Drug use: Yes     Comment: marijuana occasionally   ? Sexual activity: Defer     Allergies   Allergen Reactions   ? Other Hives and Itching     Highly acidic foods "oranges and tomatoes". Pt breaks out and itches   ? Penicillins Other (see comments)  Childhood allergy   ? Hydrocodone Hives   ? Demerol Hcl [Meperidine] GI intolerance     Nausea/Vomiting   ? Ibuprofen GI intolerance     Nausea and vomiting   ? Morphine GI intolerance     Nausea/Vomiting   ? Oxycodone Itching   ? Strawberry Itching     Fruit and strawberry flavoring   ? Tylenol [Acetaminophen] GI intolerance     Nausea and Vomiting     Current Outpatient Medications   Medication Sig Dispense Refill   ? acetaminophen (TYLENOL) 325 MG tablet Take 325 mg by mouth every 6 (six) hours as needed for mild pain (PSR 1-3) As needed     ? amLODIPine (NORVASC) 10 MG tablet Take 10 mg by mouth daily     ? dexamethasone (DECADRON) 4 MG tablet Take 8 mg daily x 2 days after each chemo day 60 tablet 2   ? docusate sodium (COLACE) 100 MG capsule Take 100 mg by mouth 2 (two) times a day As needed     ? HYDROcodone-acetaminophen (NORCO) 5-325 MG per tablet Take 1 tablet by mouth every 6 (six) hours as needed for moderate pain (PSR 4-6) 60 tablet 0   ? lidocaine (LIDODERM) 5 % Apply 1 patch topically daily Remove & discard patch within 12 hours or as directed by MD.     ? lidocaine-prilocaine (EMLA) cream Apply topically as needed for mild pain (PSR 1-3)     ? lidocaine-prilocaine (EMLA) cream Apply cream generously over port site and cover with plastic, at least 1 hour prior to each treatment appointment 30 g 2   ? losartan (COZAAR) 50 MG tablet Take 50 mg by mouth daily     ? magnesium hydroxide (MILK OF MAGNESIA) 400 MG/5ML suspension Take 15 mL by mouth daily as  needed for constipation for up to 10 days 360 mL 0   ? ondansetron (ZOFRAN) 8 MG tablet Take 8 mg by mouth every 8 (eight) hours as needed for nausea or vomiting As needed     ? ondansetron (ZOFRAN) 8 MG tablet Take 1 tablet (8 mg total) by mouth every 8 (eight) hours as needed for nausea or vomiting Indications: Nausea and Vomiting caused by Cancer Chemotherapy Start second day after chemo 30 tablet 2   ? Polyethyl Glyc-Propyl Glyc PF (Lubricant Eye Drops, PF,) 0.4-0.3 % solution Administer 1 drop into both eyes 4 (four) times a day During treatment with mirvetuximab soravtansine instill 4 times daily and as needed for dry eyes. 10 mL 3   ? prednisoLONE acetate (PRED FORTE) 1 % ophthalmic suspension Administer 1 drop into both eyes 6 (six) times a day Starting the day prior to each infusion, use one drop in each eye 6 times a day until day 4; then use 1 drop in each eye 4 times a day on days 5-8 of each cycle. 15 mL 2   ? pregabalin (LYRICA) 200 MG capsule Take 200 mg by mouth 2 (two) times a day     ? prochlorperazine (COMPAZINE) 10 MG tablet Take 10 mg by mouth every 6 (six) hours as needed for nausea or vomiting As needed     ? prochlorperazine (COMPAZINE) 10 MG tablet Take 1 tablet (10 mg total) by mouth every 6 (six) hours as needed (Nausea and Vomiting) 90 tablet 2     No current facility-administered medications for this visit.     Review of Systems   Constitutional: Positive for activity change, appetite change and  unexpected weight change.   HENT: Negative.    Eyes: Negative.    Respiratory: Negative.  Negative for shortness of breath.    Cardiovascular: Negative.  Negative for leg swelling.   Gastrointestinal: Positive for abdominal pain. Negative for bowel incontinence, constipation, rectal pain and vomiting.   Endocrine: Negative.    Genitourinary: Negative.    Musculoskeletal: Positive for arthralgias.   Skin: Negative.    Allergic/Immunologic: Negative.    Neurological: Negative.  Negative for  dizziness.   Hematological: Negative.  Negative for adenopathy.   Psychiatric/Behavioral: Negative.      Physical Exam:  Vital Signs for this encounter:  BSA: There is no height or weight on file to calculate BSA.  There were no vitals taken for this visit.    Physical Exam  Constitutional:       Appearance: She is ill-appearing.   HENT:      Head: Normocephalic.      Nose: Nose normal.      Mouth/Throat:      Mouth: Mucous membranes are moist.      Pharynx: Oropharynx is clear.   Eyes:      Extraocular Movements: Extraocular movements intact.      Conjunctiva/sclera: Conjunctivae normal.      Pupils: Pupils are equal, round, and reactive to light.   Cardiovascular:      Rate and Rhythm: Normal rate and regular rhythm.      Pulses: Normal pulses.      Heart sounds: Normal heart sounds.   Pulmonary:      Effort: Pulmonary effort is normal.      Breath sounds: Normal breath sounds.   Abdominal:      General: Abdomen is flat. Bowel sounds are normal.      Palpations: Abdomen is soft.   Musculoskeletal:         General: Normal range of motion.      Cervical back: Normal range of motion and neck supple.      Right lower leg: No edema.      Left lower leg: No edema.   Skin:     General: Skin is warm.      Capillary Refill: Capillary refill takes less than 2 seconds.   Neurological:      General: No focal deficit present.      Mental Status: She is alert and oriented to person, place, and time.     Results:  White Blood Cell   Date Value Ref Range Status   08/21/2022 5.3 4.8 - 10.8 k/mcL Final     Neutrophils Automated Absolute Number   Date Value Ref Range Status   08/21/2022 3.3 1.7 - 8.5 k/mcL Final     Hemoglobin   Date Value Ref Range Status   08/21/2022 10.9 (L) 12.0 - 16.0 gm/dL Final     Hemoglobin, POC   Date Value Ref Range Status   08/21/2022 11.9 (L) 12.0 - 16.0 g/dL Final     Hematocrit   Date Value Ref Range Status   08/21/2022 32.6 (L) 37.0 - 47.0 % Final     Hematocrit, POC   Date Value Ref Range Status    08/21/2022 35.0 (L) 37.0 - 47.0 % Final     Platelet Count   Date Value Ref Range Status   08/21/2022 171 150 - 450 k/mcL Final     Glucose Random   Date Value Ref Range Status   08/21/2022 94 70 - 120 mg/dL Final   08/21/2022  90 70 - 120 mg/dL Final     Blood Urea Nitrogen   Date Value Ref Range Status   08/21/2022 13 7 - 24 mg/dL Final     Creatinine   Date Value Ref Range Status   08/21/2022 0.60 0.44 - 1.03 mg/dL Final     eGlomerular Filtration Rate African American   Date Value Ref Range Status   08/21/2022 >60 >=60 mL/min/1.73 m(2) Final     BUN/Creatinine ratio   Date Value Ref Range Status   08/21/2022 16.2 12.0 - 20.0 Final     Sodium Level   Date Value Ref Range Status   08/21/2022 140 135 - 148 mmol/L Final     Potassium Level   Date Value Ref Range Status   08/21/2022 4.0 3.5 - 5.5 mmol/L Final     Chloride   Date Value Ref Range Status   08/21/2022 99 (L) 100 - 110 mmol/L Final     Carbon Dioxide/CO2   Date Value Ref Range Status   08/21/2022 27 22 - 32 mmol/L Final     Calcium Level Total   Date Value Ref Range Status   08/21/2022 9.6 8.4 - 10.2 mg/dL Final     Protein Total   Date Value Ref Range Status   08/21/2022 7.2 6.2 - 8.3 gm/dL Final     Albumin Level   Date Value Ref Range Status   08/21/2022 4.3 3.4 - 4.8 gm/dL Final     Cancer Antigen 125   Date Value Ref Range Status   07/31/2022 5,652.9 (H) 0.0 - 35.0 U/mL Final       Electronically signed by Doreatha Lew, MD at 09/03/2022  9:52 AM EDT

## 2022-08-29 NOTE — Telephone Encounter (Signed)
Patient called, requesting a refill on her methylPREDNISolone (MEDROL, PAK,) 4 MG tablet, she states that the medication is helping and is requesting another refill.

## 2022-08-30 NOTE — Progress Notes (Signed)
Formatting of this note might be different from the original.  Patient called stating that she cannot taking pregabalin, needs brand name Lyrica only. I sent over script to [facility] aid with Lyrica dispense as written, '200mg'$  PO BID x30 day supply.     Johna Sheriff, MD  Hematology and oncology  [place], [place]    Electronically signed by Dionne Bucy Deridder, MD at 08/30/2022 10:59 AM EDT

## 2022-09-02 NOTE — Progress Notes (Signed)
Formatting of this note is different from the original.     09/02/22 0822   Lab/Nurse Draw Documentation   Fasting/Non Fasting Non Fasting   Method of Blood Draw Venipuncture   Site: Right Arm   Patient Tolerated  Well   Tubes Collected (# and color) 1lav,1gold   Performed By Stormy Card       Electronically signed by Doreatha Lew, MD at 09/02/2022  8:33 AM EDT

## 2022-09-03 NOTE — Progress Notes (Signed)
Formatting of this note might be different from the original.  Pt ambulated into tx room alone. She is here for C1 Mirvetuximab soravtansine for Ovarian cancer per Dr. Cristina Gong. Pt identifiers confirmed. Consent noted. Educated on unit routine. Pt has eye drops as per protocol of tx.  Pt has had slit lamp exam prior to arrival. Next exam scheduled prior to C3. Pt has no baseline complaints as she reports, "this is my second time. I had treatment in [place]." Pt has port in place to R chest, present prior to tx from outside hospital. Labs drawn prior to arrival and  proceeding per MD order.    Dietician at chairside to speak with pt.     1117-Pt appears w/ facial grimacing. Pt c/o sharp abd pain. Stopped infusion and only infusing D5W as this is only compatible with med. Charge nurse notified.    Changed fluids to NS and started IVF at 999 to clear out line. Approximately 120 ml Elahare given at this time.    1120 am BP=160/87, P=76, Respirations=20, O2=100 RA. Still c/o stabbing pain to lower abd 10/10.    1124- Still w/ 1010 pain to lower abd. BP increasing = 167/98. P=79. Resp=20 and 100% RA.    1126-  Order for Solumedrol '125mg'$  given via IV per verbal order from Dr. Cristina Gong.    1131- Solumedrol 125 mg given. BP increased to 180/102 P=85, still reporting increased pain. Message sent to NP and Dr. Cristina Gong for further instructions.    1136- BP still elevated at 185/98 w/ HR=87.      1140- N. Ruthann Cancer, NP at chairside. 25 mg Benadryl and 20 mg Pepcid ordered.    1145- Bolus started of 250 ml NS per NP. BP=183/91, 84, 100% RA. Pt still appears w/ facial grimacing and pain.     Benadryl and Pepcid given. See MAR.    1478- BR=196/112. HR=90. Rest 20- oxygen=96%. Bolus stopped due to rising BP.    1156 Still c/o nausea and sharp stabbing abd pain.    1159- BP=189/96. HR=87. O2 %=100    1204- Dr. Cristina Gong and NP at chairside. '1mg'$  Ativan ordered. Note to wait 10 min and if no relief noted, send to ER.    1206- Ativan given.  BP=189/102 w/ pt c/o moderate amt of pain.    BP= 175/103 HR=81. Pt 100% RA w/ resp 20. Still no relief from medications.    1217- 911 called, daughter and husband called for updates.   BP=168/97, HR=80.    All emergent meds listed on Mar. Pt port left accessed for ER.  Electronically signed by Mertie Moores, RN at 09/04/2022  7:27 AM EDT

## 2022-09-03 NOTE — ED Notes (Signed)
Formatting of this note might be different from the original.  Patients port flushed with 500u Heparin and de-accessed per policy.     Zada Girt, RN  09/03/22 1744    Electronically signed by Zada Girt, RN at 09/03/2022  5:44 PM EDT

## 2022-09-03 NOTE — Unmapped (Signed)
Formatting of this note might be different from the original.    Attestation:    I have fully participated in the care of this patient.  I have reviewed all pertinent clinical information.  I agree with the management and disposition of this patient.  This service has been performed in part by a resident under my direction, as the teaching physician. I fully participated, in the care of this patient, reviewed the clinical information and agree with the management and disposition of this patient.  Comments::  61 year old female presenting for cancer center with abdominal pain, nausea and vomiting.  Patient is a bit sedated on my exam.  Has been given Benadryl and Ativan.  Certainly maintaining her airway.  She does have a diffusely tender abdomen.  Plan for CT scan and general lab workup.      Theda Belfast, MD  09/03/22 [phone]    Electronically signed by Theda Belfast, MD at 09/03/2022  2:12 PM EDT

## 2022-09-03 NOTE — Progress Notes (Signed)
Formatting of this note is different from the original.  Nutrition Progress Note    Nutrition Counseling Type: an initial   Encounter Date: 09/03/22  Reason for Counseling: Nutrition during cancer treatment   Type of Encounter: IN-PERSON  Referral Source:     ASSESSMENT     Pt is a 61 y.o. yo. female, diagnosed w/ endometrial CA.      MHx: HTN, hep C.    Met pt during tx. Pt reported feeling well, and w/ in good spirits. Appetite has improved since last assessment. Eating small meals throughout the day, and adding protein to each meal.   Hydration has improved, and drinking water throughout the day.   Drinking Ensure often.    Constipation has resolved. BMs are normal now, and passing soft stools daily.     No c/o n/v.    Wt stable since last assessment. CBW: 112# 09/03/22.    Talked to pt on the importance of nutrition during tx. Ensure to have an adequate intake of calories, protein, and fluids. Have the recommended intake of vegetables, fruits, and whole grains.   Educated pt on how s/s could affect nutritional status, and how to address those side effects.    Pt verbalized understanding of plan discussed and had no further questions.     Relevant Labs & Medications:  Lab Results   Component Value Date    NEUTROABS 2.6 09/02/2022     Lab Results   Component Value Date    WBC 5.2 09/02/2022     Lab Results   Component Value Date    GLUCOSE 104 09/02/2022    CALCIUM 9.3 09/02/2022    NA 145 09/02/2022    K 3.7 09/02/2022    CO2 32 09/02/2022    CL 107 09/02/2022    BUN 18 09/02/2022    CREATININE 0.96 09/02/2022     Current Outpatient Medications:   ?  acetaminophen (TYLENOL) 325 MG tablet, Take 325 mg by mouth every 6 (six) hours as needed for mild pain (PSR 1-3) As needed, Disp: , Rfl:   ?  amLODIPine (NORVASC) 10 MG tablet, Take 10 mg by mouth daily, Disp: , Rfl:   ?  dexamethasone (DECADRON) 4 MG tablet, Take 8 mg daily x 2 days after each chemo day, Disp: 60 tablet, Rfl: 2  ?  docusate sodium (COLACE) 100 MG  capsule, Take 100 mg by mouth 2 (two) times a day As needed, Disp: , Rfl:   ?  HYDROcodone-acetaminophen (NORCO) 5-325 MG per tablet, Take 1 tablet by mouth every 6 (six) hours as needed for moderate pain (PSR 4-6) for up to 14 days, Disp: 60 tablet, Rfl: 0  ?  lidocaine (LIDODERM) 5 %, Apply 1 patch topically daily Remove & discard patch within 12 hours or as directed by MD., Disp: , Rfl:   ?  lidocaine-prilocaine (EMLA) cream, Apply topically as needed for mild pain (PSR 1-3), Disp: , Rfl:   ?  lidocaine-prilocaine (EMLA) cream, Apply cream generously over port site and cover with plastic, at least 1 hour prior to each treatment appointment, Disp: 30 g, Rfl: 2  ?  losartan (COZAAR) 50 MG tablet, Take 50 mg by mouth daily, Disp: , Rfl:   ?  Lyrica 200 MG capsule, Take 1 capsule (200 mg total) by mouth 2 (two) times a day, Disp: 60 capsule, Rfl: 5  ?  methadone (DOLOPHINE) 10 MG tablet, , Disp: , Rfl:   ?  methylPREDNISolone 4 MG tablet  therapy pack, use as directed FOLLOW DIRECTIONS ON BACK OF FOIL PACK, Disp: , Rfl:   ?  ondansetron (ZOFRAN) 8 MG tablet, Take 8 mg by mouth every 8 (eight) hours as needed for nausea or vomiting As needed, Disp: , Rfl:   ?  ondansetron (ZOFRAN) 8 MG tablet, Take 1 tablet (8 mg total) by mouth every 8 (eight) hours as needed for nausea or vomiting Indications: Nausea and Vomiting caused by Cancer Chemotherapy Start second day after chemo, Disp: 30 tablet, Rfl: 2  ?  Polyethyl Glyc-Propyl Glyc PF (Lubricant Eye Drops, PF,) 0.4-0.3 % solution, Administer 1 drop into both eyes 4 (four) times a day During treatment with mirvetuximab soravtansine instill 4 times daily and as needed for dry eyes., Disp: 10 mL, Rfl: 3  ?  prednisoLONE acetate (PRED FORTE) 1 % ophthalmic suspension, Administer 1 drop into both eyes 6 (six) times a day Starting the day prior to each infusion, use one drop in each eye 6 times a day until day 4; then use 1 drop in each eye 4 times a day on days 5-8 of each  cycle., Disp: 15 mL, Rfl: 2  ?  pregabalin (LYRICA) 200 MG capsule, Take 1 capsule (200 mg total) by mouth 2 (two) times a day, Disp: 60 capsule, Rfl: 0  ?  prochlorperazine (COMPAZINE) 10 MG tablet, Take 10 mg by mouth every 6 (six) hours as needed for nausea or vomiting As needed, Disp: , Rfl:   ?  prochlorperazine (COMPAZINE) 10 MG tablet, Take 1 tablet (10 mg total) by mouth every 6 (six) hours as needed (Nausea and Vomiting), Disp: 90 tablet, Rfl: 2  No current facility-administered medications for this visit.    Facility-Administered Medications Ordered in Other Visits:   ?  dextrose 5 % infusion 250 mL, 250 mL, Intravenous, Continuous, Doreatha Lew, MD, Last Rate: 30 mL/hr at 09/03/22 0838, 250 mL at 09/03/22 6734  ?  heparin (flush) 100 UNIT/ML injection 500 Units, 500 Units, Intravenous, PRN, Doreatha Lew, MD  ?  mirvetuximab soravtansine-gynx (ELAHERE) 310 mg in dextrose 5 % 310 mL IVPB, 6 mg/kg (Treatment Plan Adjusted), Intravenous, Titrate, Doreatha Lew, MD    Anthropometrics/Weight History:  Ht: Wt: Ideal Body Weight  (IBW): BMI:   Ht Readings from Last 3 Encounters:   08/21/22 _0  (1.702 m)   07/25/22 _1  (1.702 m)     Wt Readings from Last 10 Encounters:   09/03/22 112 lb 12.8 oz (51.2 kg)   08/28/22 111 lb 5.3 oz (50.5 kg)   08/21/22 116 lb (52.6 kg)   08/21/22 116 lb 6.5 oz (52.8 kg)   07/31/22 113 lb 13.9 oz (51.6 kg)   07/25/22 120 lb (54.4 kg)   06/27/22 120 lb 7.7 oz (54.6 kg)     Wt. Changes: stable since last encounter 135 lb BMI Readings from Last 1 Encounters:   09/03/22 17.67 kg/m       Estimated Nutrition Needs:   Estimated energy needs: 1500-1800 kcal   (30-35 kcal/kg/d)    Estimated protein needs: 52-77 g   (1-1.5 g/kg/d)     Other nutrient needs: meet DRIs    Fluid needs: 1500-1800 mL   (25-30 ml/kg/d)      Dietary history:  Grits, chicken, vegetables, water melon.    Eating out: no    Fluid Intake: water    Current diet order: regular    Change in food/beverage intake:  Yes    Supplements intake: no  Exercise: yes    Physical Findings:    Nutrition Symptoms: No symptoms     GI status: WNR  Functional status: Ambulating    DIAGNOSIS     Unintended Weight Loss r/t decrease of appetite    Malnutrition Diagnosis Criteria/Malnutrition Diagnosis   ? Moderate protein-calorie malnutrition  o Etiology: Acute Illness/Injury   o % Intake: < 75% of need for > 7 days - Moderate  o Wt Loss: > 5% 1 month     INTERVENTION/RECOMMENDATIONS     Recommended to continue small frequent meals during the day to help w/ food intake and meet calorie/protein needs.  Recommended to continue w/ intake of protein w/ meals/snacks to help meeting daily needs.  Recommended to choose higher-calorie starchy vegetables such as potatoes, sweet potatoes, peas, corn.  Recommended to add high-fat foods to meals and snacks such as: cream, vegetable oils, cheese sauce, or salad dressing to get more calories.  Recommended to continue w/ ONS to help w/ calorie/protein intake.    MONITORING/EVALUATION      Will monitor wt trends, appetite, tolerance to tx.    Follow up: yes, per pt request, and/or changes on nutrition status.    Jerald Kief, RD   Registered Dietitian  [place]   Electronically signed by Jerald Kief, RD at 09/03/2022  9:35 AM EDT

## 2022-09-03 NOTE — Progress Notes (Signed)
Formatting of this note might be different from the original.  INFUSION CLINICAL MANAGEMENT:    I provided infusion clinical management.  Doreatha Lew, MD  Electronically signed by Doreatha Lew, MD at 09/08/2022  7:01 AM EDT

## 2022-09-03 NOTE — Progress Notes (Signed)
Formatting of this note might be different from the original.  TREATMENT ROOM / SOCIAL SUPPORT    Date: 09/03/2022  Location: Infusion Suite  Spoke with patient while in treatment room and provided social support. Informed patient of hand/foot and full body massage options while receiving active chemotherapy. Also informed patient of our GYN support group that will be meeting on November 6th @ 11:00am.     Romero Belling, LMT, OMS, CLT      Electronically signed by Romero Belling at 09/03/2022  3:03 PM EDT

## 2022-09-03 NOTE — ED Triage Notes (Signed)
Formatting of this note might be different from the original.  Patient arrived via EMS from the cancer center for abdominal pain.  EMS reports that the patient is in treatment for ovarian cancer and that they tried a new immunotherapy today. They report that the patient started getting the abdominal pain with nausea and vomiting about halfway through the treatment.   They report the center gave her '125mg'$  Solumedrol, '25mg'$  benadryl, 20 mg pepcid and '1mg'$  ativan - list is at bedside    EMS reports it was the daughter and husband who called 911  Electronically signed by Zada Girt, RN at 09/03/2022 12:55 PM EDT

## 2022-09-03 NOTE — ED Provider Notes (Signed)
Formatting of this note is different from the original.  HPI     Chief Complaint   Patient presents with   ? Abdominal Pain      61 year old female  With history of hypertension, PAD, hepatitis C s/p treatment in 2015, and stage IV ovarian cancer and colon CA currently on chemotherapy presents to the ED via EMS for abdominal pain onset 1130 today. Patient received new chemotherapy Sue [name]) today. During her infusion, she had acute onset of abdominal pain. She was given in total '125mg'$  IV solumedrol, '2mg'$  IV ativan, '25mg'$  IV benadryl, '20mg'$  IV pepcid and 250cc bolus. She felt nauseous with no vomiting episode. She denies any shortness of breath. No hypotension. It was documented she was hypertensive in the systolic 244W. She has no rash, no pruritis, and no throat swelling or difficulty swallowing secretions. Patient's daughter and husband called Sue [name]. She denies any chest pain or shortness of breath. No dysuria or hematuria.      Patient History     Past Medical History:   Diagnosis Date   ? Colon cancer (CMS/HCC)    ? Hepatitis C infection     treated 2015   ? Hypertension    ? Ovarian cancer (CMS/HCC)    ? PAD (peripheral artery disease) (CMS/HCC)      Past Surgical History:   Procedure Laterality Date   ? COLON SURGERY     ? OVARY SURGERY       No family history on file.    Social History     Socioeconomic History   ? Marital status: Married     Spouse name: Not on file   ? Number of children: Not on file   ? Years of education: Not on file   ? Highest education level: Not on file   Occupational History   ? Not on file   Tobacco Use   ? Smoking status: Never   ? Smokeless tobacco: Never   Vaping Use   ? Vaping Use: Never used   Substance and Sexual Activity   ? Alcohol use: Not Currently   ? Drug use: Yes     Comment: marijuana occasionally   ? Sexual activity: Defer   Other Topics Concern   ? Not on file   Social History Narrative   ? Not on file     Social Determinants of Health     Financial Resource Strain: Not  on file   Food Insecurity: Not on file   Transportation Needs: Not on file   Physical Activity: Not on file   Stress: Not on file   Social Connections: Not on file   Intimate Partner Violence: Not on file   Housing Stability: Not on file      Review of Systems     Review of Systems   Clinically appropriate and pertinent ROS in HPI     Physical Exam     ED Triage Vitals   Temp Heart Rate Resp BP SpO2   09/03/22 1251 09/03/22 1251 09/03/22 1251 09/03/22 1300 09/03/22 1251   98.9 F (37.2 C) 79 16 171/83 100 %     Temp Source Heart Rate Source Patient Position BP Location FiO2 (%)   09/03/22 1251 09/03/22 1300 -- 09/03/22 1300 --   Oral Monitor  Left arm      Physical Exam  Vitals and nursing note reviewed.   Constitutional:       Comments: Mild acute distress secondary to pain. Drowsy but easily  arousable.    HENT:      Head: Normocephalic and atraumatic.      Right Ear: External ear normal.      Left Ear: External ear normal.      Nose: Nose normal.      Mouth/Throat:      Mouth: Mucous membranes are moist.   Eyes:      Extraocular Movements: Extraocular movements intact.   Cardiovascular:      Rate and Rhythm: Normal rate and regular rhythm.      Heart sounds: Normal heart sounds. No murmur heard.     Comments: 2+ radial pulses bilaterally  Pulmonary:      Effort: Pulmonary effort is normal. No respiratory distress.      Breath sounds: Normal breath sounds. No wheezing or rhonchi.   Abdominal:      General: There is no distension.      Palpations: Abdomen is soft.      Comments: Diffusely tenderness to palpation     Musculoskeletal:         General: No deformity. Normal range of motion.      Cervical back: Neck supple.      Right lower leg: No edema.      Left lower leg: No edema. Skin:     General: Skin is warm and dry.      Capillary Refill: Capillary refill takes less than 2 seconds.   Neurological:      Mental Status: She is alert and oriented to person, place, and time.      Comments: Moving all extremities  freely. Follows commands.    Psychiatric:         Mood and Affect: Mood normal.         Behavior: Behavior normal.      ED Course & MDM     Labs Reviewed   COMPREHENSIVE METABOLIC PANEL   LIPASE   BILIRUBIN, DIRECT   URINALYSIS WITH CULTURE REFLEX     No orders to display     Incomplete F Codes     No Incomplete F-Codes Found     Completed F Codes     No Incomplete F-Codes Found       Medical Decision Making   61 year old female  With history of hypertension, PAD, hepatitis C s/p treatment in 2015, and stage IV ovarian cancer and colon CA currently on chemotherapy presents to the ED via EMS for abdominal pain onset 1130 today. She was given in total '125mg'$  IV solumedrol, '2mg'$  IV ativan, '25mg'$  IV benadryl, '20mg'$  IV pepcid and 250cc bolus.       Initial vital signs notable for hypertension for which patient has past medical history of.  Otherwise she is hemodynamically stable and afebrile.    On physical exam she is drowsy but easily arousable.  She is alert and oriented x3.  She is in mild acute distress secondary to pain.   Abdomen is soft,   Nondistended.  Abdomen is diffusely tender to palpation.    ED Course as of 09/03/22 1650   Wed Sep 03, 2022   1406 POC Chem8 is unremarkable. No elevated BUN or Cr and patient can proceed with CT w/ contrast    EKG interpreted by me, resident Sue [name], as NSR at 80 bpm.  Normal axis.  Normal intervals.  Nonspecific T-wave abnormalities in the inferolateral leads.  Otherwise no signs of acute ischemia or arrhythmia. No prior EKG for comparison [Sue [name]]   1430 CMP unremarkable  Lipase WNL  Direct bilirubin not elevated  UA pending [Sue [name]]   1435 Notified by nursing patient is actively vomiting. Will prescribe zofran at this time.  [Sue [name]]   1751 Patient care turned over to Sue [name] pending CT abdomen and pelvis for disposition. She remains stable at time of turnover.  [Sue [name]]   1452 Received pt in signout from Sue [name]. 61 yo F h/o ovarian cancer who presents from cancer center with  nausea/abd pain after immunotherapy. Labs, UA, CTAP pending. Dispo pending those results [PT]   0258 UA normal. Labs reassuring. CT shows no acute emergency. Does show rectosigmoid thickening which is c/f malignancy given pt's history. D/w pt and daughter. Pt stable for dc [PT]     ED Course User Index  [Sue [name]] Cristine Polio, DO  [PT] Len Blalock, MD     Signed by:    Cristine Polio, DO  [facility] EM PGY-2  on 09/03/22    I've used voice to text software in preparing this note, which may have caused misinterpretation of vocabulary, grammar, structure, and note format.If there are substantial concerns about the content of this note that may affect patient care, please contact me for clarification.             ED Critical Care Provider Statement    Critical Care Provider Statement:             Cristine Polio, DO  Resident  09/03/22 [phone]    Electronically signed by Theda Belfast, MD at 09/06/2022 12:53 PM EDT

## 2022-09-04 NOTE — Telephone Encounter (Signed)
Summary: Domingo Pulse of this note might be different from the original.  Follow-up call to patient after treatment on 09-04-2022  Reviewed chart and during infusion  Patient had severe abdominal pain and 911 was called and patient sent to emergency room.  Patient was evaluated in emergency room and discharged.  Reports the abdominal pain has gone and she feels okay, slept off and on last night. Patient had vomiting last night and took prochlorperazine with relief.  Reports she has been eating and drinking fluids.  Patient has constipation and this is not new, has colace and miralax and understands to take daily as needed.  Patient asked would Dr Cristina Gong reduce dose of  mirvetuximab soravtansine and discussed when she seen Dr Cristina Gong on  09-24-2022 will evaluate.  Reports she only received half of treatment yesterday 09-03-2022.  Discussed to call office back if she has any concerns or questions.  Patient verbalized understanding to above information discussed.  Electronically Signed By: Margit Hanks, RN 09/04/2022 3:26 PM       Electronically signed by Margit Hanks, RN at 09/04/2022  3:31 PM EDT

## 2022-09-04 NOTE — Telephone Encounter (Signed)
Formatting of this note might be different from the original.  Called pt's husband for update of pt status. Advised that 911 had been called and pt to ER.  Electronically signed by Mertie Moores, RN at 09/04/2022  7:29 AM EDT

## 2022-09-10 NOTE — Telephone Encounter (Signed)
Formatting of this note might be different from the original.  Called and left voicemail with patient in regards to scheduling a follow up with Dr. Cristina Gong before treatment. Advised patient to call back to confirm appointment.    Buford Dresser, CMA  Electronically signed by Buford Dresser, CMA at 09/10/2022  9:18 AM EDT

## 2022-09-22 NOTE — Progress Notes (Signed)
Formatting of this note is different from the original.  [facility] HEMATOLOGY-ONCOLOGY CONSULTATION    ASSESSMENT/PLAN:  1. 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     - Periodic surveillance CT scans unremarkable until 05/24/2019 when new peritoneal carcinomatosis was seen.     - Treatments:       - 6 cycles of Carboplatin + Taxol (06/20/19 - 11/14/19).       - Maintenance Olaparib started 12/15/19 but led to disease progression.       - Second-line treatment with Carbo/Gem and Bevacizumab (02/11/21 - 06/06/21).       - Bevacizumab alone (07/16/21 - 09/27/21); disease progression confirmed on 10/29/2021.       - Received liposomal doxorubicin between 11/12/21 - 05/22/22.     - Most recent scan on 06/06/2022: Post hysterectomy and oophorectomy status with a similar soft tissue mass in the low pelvis.     - CA-125 levels steadily rising, reached 2037 on 05/23/22.     - Discussed single-agent chemotherapy with Taxotere versus hospice; patient declined hospice, stating she's "not ready to give up."     - Baseline CT C/A/P (07/10/22) revealed significant disease in the abdomen-pelvis without frank evidence of pulmonary involvement.     - CT-guided biopsy of right lower quadrant nodule (07/25/22) consistent with adenocarcinoma.     - Patient has >90% of folate receptor alpha (FR?) antibody and considered for mirvetuximab soravtansine (ELAHERE). Discussed potential adverse effects including keratopathy and other ophthalmic complications.  - Cycle 1 of Mirvetuximab [id number](12/02/30) was complicated by acute abdominal spasm refractory to Benadryl, Pepcid, dexamethasone, and patient's home Norco. Infusion was aborted after 17m of drug administration and briefly evaluated at the ER without acute abnormality of bowel obstruction or  perforation.    2. Recurrent abdominal spasms  - Patient has significant abdominal-pelvic metastasis and peritoneal implants.  - Continue Lyrica.  - Continue Norco.  - Extensive discussion about active cancer-directed therapy versus hospice. Patient expresses strong interest in cancer-directed therapy. Will proceed with premedication as prescribed: Tylenol, dexamethasone, Benadryl, and Pepcid. Patient will take home Lyrica and Norco.    3. Toxicity Monitoring     - Baseline ophthalmology exam completed and received partial C1 on 09/03/22.  - Continue to monitor with regular ophthalmology exams.    4. Stage III Adenocarcinoma of Right Colon     - 09/18/2014: Right hemicolectomy and terminal ileoectomy revealed adenocarcinoma of the right colon, pT3N1Mx.     - Patient undergoing regular CT scan reevaluations for ovarian cancer.    5. Cancer-Related Pain     - Reports worsened pain after biopsy.     - Norco every 6 hours.     - Palliative referral placed.    6. History of AVN of Femoral Heads     - [facility] ER evaluation (08/23/22) and MRI showed Bilateral AVN of the femoral heads, left worse than right. Advanced degenerative changes in both hips. No occult fracture. Small left hip effusion.     - Continue Lyrica 2049mBID.     - Continue Norco.    7. Malnutrition     - Oncology RD evaluation.   - Continue Ensure and snacks.       - RTC in 4 weeks.    ONCOLOGY HISTORY  1. Stage III Adenocarcinoma of Right Colon:     -  09/16/2014: CT AP shows a 3.8 cm cecal mass with associated ileocecal intussusception and a 9.1 cm solid and cystic mass in the left pelvis.     - 09/18/2014: Right hemicolectomy and terminal ileoectomy reveal adenocarcinoma of the right colon with certain characteristics and negative nodes. Pathologic stage: pT3N1Mx.     - 08/06/2016: CT C/AP shows no recurrent malignancy, stable occlusion of left common iliac artery, and pulmonary nodules.     - 03/08/2017: CT C/AP shows no evidence of recurrent  malignancy or metastatic disease.     - 11/13/2017: CT AP reveals no findings of recurrent malignancy and mentions other imaging findings.     - 10/22/2020: CT chest shows reduced size of nodules along the right paracolic gutter, compatible with treatment response.     - 01/28/2021: CT C/AP shows changes related to right hemicolectomy and presence of new low-density nodules.     - 06/18/2021: CT C/AP indicates subtle increase in peritoneal thickening, stable appearance of fascial thickening, and avascular necrosis.     - 10/29/2021: CT AP suggests evidence of progression of peritoneal metastasis and an increase in peritoneal thickening.     - 02/06/2022: CT AP reveals unchanged peritoneal thickening and nodularity with stable metastatic disease.     - 06/06/2022: CT AP shows unchanged peritoneal thickening and nodularity, stable metastatic disease, and no evidence of new metastatic disease.    2. Stage IV Ovarian Cancer with Mixed Endometrioid and Clear Cell:  PDL1 20% , FOLR1 90%, ER(90%), PR(2%),MMRp  - 10/09/2014: CT AP identifies a 9.1 cm solid and cystic mass in the left pelvis.  - 11/01/2014: Salpingo-oophorectomy reveals mixed endometrioid and clear cell carcinoma.  - 03/12/2017: R breast nipple biopsy confirms benign tissue.  - 03/22/2018: Bilateral Screening MMG shows no evidence of malignancy.  - 05/24/2019: CT AP reports new peritoneal carcinomatosis in the pelvis and right lower quadrant.  - 06/06/2019: CT Chest shows stable bilateral pulmonary nodules and aortic atherosclerosis.  - 06/07/2019: Peritoneal nodule soft tissue biopsy indicates metastatic carcinoma consistent with ovarian carcinoma.  - 08/25/2019: CT C/AP shows slight interval decrease in the size of the peritoneal nodule.  - 11/29/2019: CT C/AP shows a decreased right lower quadrant peritoneal implant and stable aortic atherosclerosis.  - 04/19/2020: CT AP indicates continued slight interval decrease in the size of a soft tissue nodule and  no evidence of new metastatic disease.  - 10/22/2020: CT chest shows a reduced size of a nodule along the right paracolic gutter and mild reduction in a band-like density along the rectosigmoid mesentery.  - 01/28/2021: CT C/AP reveals changes related to right hemicolectomy and new well-circumscribed low-density nodules.  - 06/18/2021: CT C/AP shows nodular fascial thickening with a subtle increase along the right pericolic gutter.  - 10/29/2021: CT AP indicates evidence of progression of peritoneal metastasis.  - 02/06/2022: CT AP shows unchanged peritoneal thickening and nodularity, stable metastatic disease, and no evidence of new metastatic disease.  - 06/06/2022: CT AP reveals status post hysterectomy and oophorectomy, with a similar appearance of a soft tissue mass in the low pelvis.  - 07/10/2022: CT C/AP showed 2 small noncalcified pulmonary nodules. Severe wall thickening of the rectosigmoid junction with adjacent fat stranding. This may represent sequelae of colonic neoplasm versus drop metastases from reported ovarian cancer. Soft tissue nodule within the inferior left colic gutter which may represent a metastatic implant. Borderline retroperitoneal adenopathy. Complete occlusion of the left iliac and femoral arteries with femoral jump graft in place.  TREATMENT  - 06/20/19 - 11/14/19: Completed 6 cycles of Carboplatin + Taxol.  - 12/15/19: Waldron Session but disease progressed.  - 02/11/21 - 06/06/21: Received Carboplatin/Gemcitabine and Avastin but disease progressed.  - 07/16/21 - 09/27/21: Received Ovarian Bevacizumab but disease progressed.  - 11/12/21 - 05/22/22: Received Ovarian Lipsomal Doxorubicin.    CA-125 Levels:  - 05/02/2019: 360  - 06/16/2019: 499  - 07/11/2019: 438  - 08/05/2019: 272  - 08/26/2019: 189  - 09/26/2019: 137  - 10/03/2019: 137  - 10/31/2019: 101  - 12/23/2019: 55.9  - 01/02/2020: 58.6  - 02/03/2020: 52.2  - 02/24/2020: 64.8  - 08/15/2020: 47.1  - 10/23/2020: 74.6  -  01/21/2021: 825  - 02/18/2021: 161  - 03/04/2021: 541  - 03/11/2021: 568  - 03/25/2021: 367  - 04/08/2021: 273  - 04/29/2021: 431  - 06/06/2021: 096  - 07/17/2021: 996  - 08/06/2021: 975  - 05/23/2022: 2037  - 07/31/2022: 5652    History of Present Illness:  Ms. Antonetti is a very pleasant a 61 year old woman with a medical history of hypertension, successfully treated HCV, peripheral arterial disease, and a prior fem-fem bypass graft presented for evaluation and management of both colon and ovarian cancer.     Her ovarian cancer was diagnosed as Stage IV with a mixed endometrioid and clear cell carcinoma. The initial diagnosis, dating back to 10/09/2014, revealed a sizable 9.1 cm pelvic mass, ultimately confirmed via salpingo-oophorectomy on 11/01/2014. Periodic surveillance CT scans showed no concerning findings until 05/24/2019, when new peritoneal carcinomatosis was observed. Following this, she underwent a series of treatments, including Carboplatin + Taxol for six cycles from 06/20/19 to 11/14/19, maintenance Olaparib from 12/15/19, and second-line Carbo/gem plus Bevacizumab from 02/11/21 to 06/06/21. She continued with Bevacizumab from 07/16/21 to 09/27/21, but disease progression was confirmed on 10/29/2021. Subsequently, she received liposomal doxorubicin from 11/12/21 to 05/22/22. In her most recent CT scan on 06/06/2022, a similar soft tissue mass was observed in the lower pelvis following a prior hysterectomy and oophorectomy. Her CA-125 levels have been consistently rising, reaching 2037 on 05/23/22. The patient is determined to continue active treatment and has opted for Taxotere over hospice, expressing her readiness to fight. She relocated to her hometown to be closer to her support network of family and friends and is managing anxiety and pain with the use of medical marijuana.    INTERIM HISTORY  - Patient returns for follow-up.  - Reports she has been experiencing recurrent abdominal spasmodic  pain.  - Reports she has been eating and drinking well, and her weight is overall stable.  - She is very anxious about resuming treatment with mirvetuximab given prior episode of intense abdominal pain but interested in pursuing treatment due to limited valid treatment options.    Past Medical History:   Diagnosis Date   ? Colon cancer (CMS/HCC)    ? Hepatitis C infection     treated 2015   ? Hypertension    ? Ovarian cancer (CMS/HCC)    ? PAD (peripheral artery disease) (CMS/HCC)      Past Surgical History:   Procedure Laterality Date   ? COLON SURGERY     ? OVARY SURGERY         No family history on file.     Social History     Socioeconomic History   ? Marital status: Married   Tobacco Use   ? Smoking status: Never   ? Smokeless tobacco: Never   Vaping Use   ?  Vaping Use: Never used   Substance and Sexual Activity   ? Alcohol use: Not Currently   ? Drug use: Yes     Comment: marijuana occasionally   ? Sexual activity: Defer     Allergies   Allergen Reactions   ? Other Hives and Itching     Highly acidic foods "oranges and tomatoes". Pt breaks out and itches   ? Penicillins Other (see comments)     Childhood allergy   ? Hydrocodone Hives   ? Demerol Hcl [Meperidine] GI intolerance     Nausea/Vomiting   ? Ibuprofen GI intolerance     Nausea and vomiting   ? Morphine GI intolerance     Nausea/Vomiting   ? Oxycodone Itching   ? Strawberry Itching     Fruit and strawberry flavoring   ? Tylenol [Acetaminophen] GI intolerance     Nausea and Vomiting     Current Outpatient Medications   Medication Sig Dispense Refill   ? acetaminophen (TYLENOL) 325 MG tablet Take 325 mg by mouth every 6 (six) hours as needed for mild pain (PSR 1-3) As needed     ? amLODIPine (NORVASC) 10 MG tablet Take 10 mg by mouth daily     ? dexamethasone (DECADRON) 4 MG tablet Take 8 mg daily x 2 days after each chemo day 60 tablet 2   ? docusate sodium (COLACE) 100 MG capsule Take 100 mg by mouth 2 (two) times a day As needed     ?  HYDROcodone-acetaminophen (NORCO) 5-325 MG per tablet Take 1-2 tablets by mouth every 6 (six) hours as needed for moderate pain (PSR 4-6) 60 tablet 0   ? lidocaine (LIDODERM) 5 % Apply 1 patch topically daily Remove & discard patch within 12 hours or as directed by MD.     ? lidocaine-prilocaine (EMLA) cream Apply topically as needed for mild pain (PSR 1-3)     ? lidocaine-prilocaine (EMLA) cream Apply cream generously over port site and cover with plastic, at least 1 hour prior to each treatment appointment 30 g 2   ? losartan (COZAAR) 50 MG tablet Take 50 mg by mouth daily     ? Lyrica 200 MG capsule Take 1 capsule (200 mg total) by mouth 2 (two) times a day 60 capsule 5   ? methadone (DOLOPHINE) 10 MG tablet      ? methylPREDNISolone 4 MG tablet therapy pack use as directed FOLLOW DIRECTIONS ON BACK OF FOIL PACK     ? ondansetron (ZOFRAN) 8 MG tablet Take 8 mg by mouth every 8 (eight) hours as needed for nausea or vomiting As needed     ? ondansetron (ZOFRAN) 8 MG tablet Take 1 tablet (8 mg total) by mouth every 8 (eight) hours as needed for nausea or vomiting Indications: Nausea and Vomiting caused by Cancer Chemotherapy Start second day after chemo 30 tablet 2   ? Polyethyl Glyc-Propyl Glyc PF (Lubricant Eye Drops, PF,) 0.4-0.3 % solution Administer 1 drop into both eyes 4 (four) times a day During treatment with mirvetuximab soravtansine instill 4 times daily and as needed for dry eyes. 10 mL 3   ? prednisoLONE acetate (PRED FORTE) 1 % ophthalmic suspension Administer 1 drop into both eyes 6 (six) times a day Starting the day prior to each infusion, use one drop in each eye 6 times a day until day 4; then use 1 drop in each eye 4 times a day on days 5-8 of each cycle. 15 mL  2   ? pregabalin (LYRICA) 200 MG capsule Take 1 capsule (200 mg total) by mouth 2 (two) times a day 60 capsule 0   ? prochlorperazine (COMPAZINE) 10 MG tablet Take 10 mg by mouth every 6 (six) hours as needed for nausea or vomiting As  needed     ? prochlorperazine (COMPAZINE) 10 MG tablet Take 1 tablet (10 mg total) by mouth every 6 (six) hours as needed (Nausea and Vomiting) 90 tablet 2     No current facility-administered medications for this visit.     Review of Systems   Constitutional: Positive for activity change, appetite change and unexpected weight change.   HENT: Negative.    Eyes: Negative.    Respiratory: Negative.  Negative for shortness of breath.    Cardiovascular: Negative.  Negative for leg swelling.   Gastrointestinal: Positive for abdominal pain. Negative for bowel incontinence, constipation, rectal pain and vomiting.   Endocrine: Negative.    Genitourinary: Negative.    Musculoskeletal: Positive for arthralgias.   Skin: Negative.    Allergic/Immunologic: Negative.    Neurological: Negative.  Negative for dizziness.   Hematological: Negative.  Negative for adenopathy.   Psychiatric/Behavioral: Negative.      Physical Exam:  Vital Signs for this encounter:  BSA: There is no height or weight on file to calculate BSA.  There were no vitals taken for this visit.    Physical Exam  Constitutional:       Appearance: She is ill-appearing.   HENT:      Head: Normocephalic.      Nose: Nose normal.      Mouth/Throat:      Mouth: Mucous membranes are moist.      Pharynx: Oropharynx is clear.   Eyes:      Extraocular Movements: Extraocular movements intact.      Conjunctiva/sclera: Conjunctivae normal.      Pupils: Pupils are equal, round, and reactive to light.   Cardiovascular:      Rate and Rhythm: Normal rate and regular rhythm.      Pulses: Normal pulses.      Heart sounds: Normal heart sounds.   Pulmonary:      Effort: Pulmonary effort is normal.      Breath sounds: Normal breath sounds.   Abdominal:      General: Abdomen is flat. Bowel sounds are normal.      Palpations: Abdomen is soft.   Musculoskeletal:         General: Normal range of motion.      Cervical back: Normal range of motion and neck supple.      Right lower leg: No  edema.      Left lower leg: No edema.   Skin:     General: Skin is warm.      Capillary Refill: Capillary refill takes less than 2 seconds.   Neurological:      General: No focal deficit present.      Mental Status: She is alert and oriented to person, place, and time.     Results:  White Blood Cell   Date Value Ref Range Status   09/03/2022 5.4 4.8 - 10.8 k/mcL Final     Neutrophils Automated Absolute Number   Date Value Ref Range Status   09/02/2022 2.6 1.7 - 8.5 k/mcL Final     Segmented Neutrophils Manual Absolute Number   Date Value Ref Range Status   09/03/2022 4.9 k/mcL Final     Hemoglobin  Date Value Ref Range Status   09/03/2022 12.2 12.0 - 16.0 gm/dL Final     Hemoglobin, POC   Date Value Ref Range Status   09/03/2022 17.0 (H) 12.0 - 16.0 g/dL Final     Hematocrit   Date Value Ref Range Status   09/03/2022 35.4 (L) 37.0 - 47.0 % Final     Hematocrit, POC   Date Value Ref Range Status   09/03/2022 50.0 (H) 37.0 - 47.0 % Final     Platelet Count   Date Value Ref Range Status   09/03/2022 158 150 - 450 k/mcL Final     Glucose Random   Date Value Ref Range Status   09/03/2022 119 70 - 120 mg/dL Final   09/03/2022 114 70 - 120 mg/dL Final     Blood Urea Nitrogen   Date Value Ref Range Status   09/03/2022 15 7 - 24 mg/dL Final     Creatinine   Date Value Ref Range Status   09/03/2022 0.70 0.44 - 1.03 mg/dL Final     eGlomerular Filtration Rate African American   Date Value Ref Range Status   09/03/2022 >60 >=60 mL/min/1.73 m(2) Final     BUN/Creatinine ratio   Date Value Ref Range Status   09/03/2022 18.3 12.0 - 20.0 Final     Sodium Level   Date Value Ref Range Status   09/03/2022 143 135 - 148 mmol/L Final     Potassium Level   Date Value Ref Range Status   09/03/2022 3.6 3.5 - 5.5 mmol/L Final     Chloride   Date Value Ref Range Status   09/03/2022 101 100 - 110 mmol/L Final     Carbon Dioxide/CO2   Date Value Ref Range Status   09/03/2022 25 22 - 32 mmol/L Final     Calcium Level Total   Date Value Ref  Range Status   09/03/2022 9.7 8.4 - 10.2 mg/dL Final     Protein Total   Date Value Ref Range Status   09/03/2022 8.2 6.2 - 8.3 gm/dL Final     Albumin Level   Date Value Ref Range Status   09/03/2022 4.7 3.4 - 4.8 gm/dL Final     Cancer Antigen 125   Date Value Ref Range Status   07/31/2022 5,652.9 (H) 0.0 - 35.0 U/mL Final       Electronically signed by Doreatha Lew, MD at 09/22/2022  5:38 PM EDT

## 2022-09-24 NOTE — Progress Notes (Signed)
Formatting of this note might be different from the original.  Pt ambulated into tx room alone. She is here for rechallenged/delayed C1 Mirvetuxximab soravtansine (Elahere) infusion for Ovarian cancer per Dr. Cristina Gong. Pt identifiers confirmed. Consent noted. Pt reports appetite WNL. Pt reports taking pretx eye drops as MD order. No reports of visual issues. PO fluids WNL. Pt reports some constipation for a "couple of days" post last tx. Reports daily senokot tablets. Encouraged increased fiber, ex. Warm prunes/prune juice. Pt reports fall 2 days post tx at daughters house. No noted falls since post tx fall. Pt reports believed fall r/t to socks. Encouraged non-skid socks daily. Labs performed prior to arrival and WNL to proceed.    Port accessed and WNL to proceed.    Tx completed and VS appears stable for d/c.  Clinic Discharge Note  Documented problems discussed with the physician, Patient monitored closely and remained stable during treatment, Discharge instructions given to patient. All questions were answered., Discharge instructions were reinforced with patient. All questions were answered., Patient verbalized understanding of discharge instructions, Patient instructed to call office with any questions or concerns, Return appointment scheduled and Discharged from clinic  Patient status: alert, oriented, stable and without complaints  Accompanied: self  Teaching: S/S of infection, Hydration, Mouth sores, Unrelieved nausea, vomiting or diarrhea, Phlebitis, Fever/Chills, Specific drug effects, Rash, Neutropenic precautions and Thrombocytopenic precautions  Discharge status: ambulatory      Electronically signed by Mertie Moores, RN at 09/24/2022  4:43 PM EDT

## 2022-09-24 NOTE — Progress Notes (Signed)
Formatting of this note might be different from the original.  INFUSION CLINICAL MANAGEMENT:    I provided infusion clinical management.  Doreatha Lew, MD  Electronically signed by Doreatha Lew, MD at 09/24/2022  5:40 PM EDT

## 2022-09-30 NOTE — ED Notes (Signed)
Formatting of this note might be different from the original.  Pain assessment on discharge was 0.  Condition stable.  Patient discharged to home.  Patient education was completed:  yes  Education taught to:  patient  Teaching method used was discussion.  Understanding of teaching was good.  Patient was discharged ambulatory.  Discharged with family.  Valuables were given to: patient.   Electronically signed by Dollene Primrose, RN at 09/30/2022  4:10 PM EST

## 2022-09-30 NOTE — ED Notes (Signed)
Formatting of this note is different from the original.     09/30/22 1325   Lab Draw   Lab Draw Time 1320   Lab Sent Time 1325   Lab Draw Site Right;Antecubital   Lab Draw Device Angiocatheter   Device Gauge 20   # of Lab Draw Attempts 1 Attempt   Labs Drawn with IV Start Yes   Lab Tubes Rainbow;SST;Green;Blue;Lavender;ISTAT       Electronically signed by Jannet Mantis at 09/30/2022  1:29 PM EST

## 2022-09-30 NOTE — ED Triage Notes (Signed)
Formatting of this note might be different from the original.  Ovarian cancer patient. C/o diarrhea for two days. Thinks she might be dehydrated.  Electronically signed by Dollene Primrose, RN at 09/30/2022 12:45 PM EST

## 2022-10-07 ENCOUNTER — Ambulatory Visit
Payer: MEDICARE | Attending: Student in an Organized Health Care Education/Training Program | Primary: Student in an Organized Health Care Education/Training Program

## 2022-10-13 ENCOUNTER — Ambulatory Visit
Admit: 2022-10-13 | Discharge: 2022-10-13 | Payer: MEDICARE | Attending: Student in an Organized Health Care Education/Training Program | Primary: Student in an Organized Health Care Education/Training Program

## 2022-10-13 DIAGNOSIS — I739 Peripheral vascular disease, unspecified: Secondary | ICD-10-CM

## 2022-10-13 NOTE — Progress Notes (Addendum)
Sue [name] (DOB: 12/25/1960) is a 61 y.o. female, established patient, here for evaluation of the following chief complaint(s):  Follow-up (11/07. Pt states her bursitis acting up. Pt states she is still having diarrhea after she eats, pt states pepto helps.)       Assessment/Plan:  1. Claudication in peripheral vascular disease ([place])  Majority of mobility related pain appears to be secondary to claudication.  Known femoropopliteal bypass previously.  Was following with vascular before her move.  Will reestablish with vascular locally.  Discussed starting walking exercise therapy to gradually increase in distance, time and intensity.  - [facility], [employer] ([place])    2. Moderate protein-calorie malnutrition ([place])  Continuing to work on nutritional supplements including ensures and higher density calorie/nutrition foods/supplements.    3. 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 [facility])  Following with heme-onc.  Now tolerating infusion well with prophylactic therapies.  Continuing with life-prolonging treatments.    4. Avascular necrosis of left femoral head (HCC)  Improvement of hip pain with Medrol Dosepak last visit.  Would hesitate to continue using these as it may worsen the problem, especially as she is premedicating her infusions with dexamethasone.  Continue pain management through oncology.      Return in about 3 months (around 01/13/2023) for routine check up.      Subjective/Objective:  HPI    Cancer treatments:  - Infusion upcoming next week. Originally   - Some stomach symptoms still. Using Pepto. Sensitivity to milk.   - Wanting to proceed with cancer directed therapies. Not interested in Hospice, "I have faith. I have had faith my whole life."    Weight  - Ensure, tuna fish, V-8 Splash.     Calf pain-  -Following exercise such as cleaning the house, will have significant bilateral calf discomfort.  Described as achiness, burning or numbness.  Improves completely  with rest.    Physical Exam  Blood pressure 122/78, pulse 85, temperature 97.3 F (36.3 C), temperature source Tympanic, resp. rate 16, weight 51.4 kg (113 lb 6 oz), SpO2 100 %. Body mass index is 17.76 kg/m.  General appearance - Alert, NAD, normal appearance  Head - Atraumatic. Normocephalic.   Eyes - EOMI. Sclera white.   Ears - Hearing grossly normal.    Respiratory - LCTAB. Effort normal, without respiratory distress. No wheeze/rale/rhonchi  Heart - Normal rate, regular rhythm. No m/r/r  Neurological - No gross focal deficits. Speech normal. Alert and oriented. Mental status is at baseline.   Musculoskeletal - Grossly normal ROM, Gait normal.    Skin - normal coloration and normal turgor.       Medical History- Reviewed Social History- Reviewed Surgical History- Reviewed   Aaniya has no past medical history on file. Sabreena reports that she has never smoked. She has never used smokeless tobacco. She reports current drug use. Drug: Marijuana Sherrie Mustache). She reports that she does not drink alcohol. Alice has a past surgical history that includes CT NEEDLE BIOPSY SPINE (06/07/2019).         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; Cancer associated pain; Cancer of right colon ([place]); Chronic hepatitis C without hepatic coma ([place]); Deficiency anemia; Epistaxis; Essential hypertension; Intussusception, ileocecal ([place]); Left leg pain; Lumbosacral plexopathy; Mass of pelvis; Nausea without vomiting; Neuropathy, idiopathic; Night sweats; Numbness of left foot; 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 [facility]); Claudication in  peripheral vascular disease ([place]); Pancytopenia, acquired ([place]); Physical debility; Postoperative anemia due to acute blood loss; Type II CRPS (complex regional pain syndrome); Vitamin D deficiency; Weight loss, abnormal; and Protein-calorie malnutrition ([place]) on their problem list.       Current  Outpatient Medications   Medication Instructions    acetaminophen (TYLENOL) 325 mg, EVERY 6 HOURS PRN    atenolol (TENORMIN) 50 mg, DAILY    docusate (COLACE, DULCOLAX) 100 mg, Oral, 2 TIMES DAILY    doxazosin (CARDURA) 2 mg, Oral, DAILY    HYDROcodone-acetaminophen (NORCO) 10-325 MG per tablet 1 tablet, Oral, EVERY 6 HOURS PRN    lidocaine (LIDODERM) 5 % 1 patch, Topical, DAILY    lidocaine-prilocaine (EMLA) 2.5-2.5 % cream Topical, PRN    losartan (COZAAR) 100 mg, Oral, DAILY    LYRICA 200 MG capsule take 1 capsule by mouth every 8 hours    ondansetron (ZOFRAN) 4 mg, Oral, EVERY 8 HOURS PRN    prochlorperazine (COMPAZINE) 10 mg, EVERY 6 HOURS PRN       On this date 10/13/2022 I have spent 32 minutes reviewing previous notes, test results and face to face with the patient discussing the diagnosis and importance of compliance with the treatment plan as well as documenting on the day of the visit.    Note to patient: The purpose of this note is to communicate with the other providers involved in your care. It is written using standard medical terminology. The voice recognition software Dragon was used and quite often unanticipated grammatical, syntax, homophones, and other interpretive errors are inadvertently transcribed. Please disregard these and excuse any that have escaped final proofreading. If you have questions regarding details of the note please call my office at [phone] and make an appointment to discuss your concerns.    An electronic signature was used to authenticate this note.  -- Vivianne Master, MD

## 2022-10-13 NOTE — Progress Notes (Signed)
Chief Complaint   Patient presents with    Follow-up     11/07. Pt states her bursitis acting up. Pt states she is still having diarrhea after she eats, pt states pepto helps.       1. "Have you been to the ER, urgent care clinic since your last visit?  Hospitalized since your last visit?"   Yes 11/07  2. "Have you seen or consulted any other health care providers outside of the [facility] since your last visit?" No     3. For patients aged 61-75: Has the patient had a colonoscopy / FIT/ Cologuard? No      If the patient is female:    4. For patients aged 22-74: Has the patient had a mammogram within the past 2 years? No      5. For patients aged 21-65: Has the patient had a pap smear? No

## 2022-10-14 NOTE — Telephone Encounter (Signed)
Formatting of this note might be different from the original.  Pt came to office to have labs drawn. Requested to reschedule tx for tomorrow as she is going out of town. Pt requested refill of pain medications. Message sent to Dr. Fransico Setters team.  Electronically signed by Mertie Moores, RN at 10/30/2022  8:40 AM EST

## 2022-10-14 NOTE — Addendum Note (Signed)
Addended by: Corliss Marcus on: 10/14/2022 09:57 AM     Modules accepted: Orders      Electronically signed by Corliss Marcus, MA at 10/14/2022  9:57 AM EST

## 2022-10-14 NOTE — Progress Notes (Signed)
Formatting of this note is different from the original.     10/14/22 0955   Lab/Nurse Draw Documentation   Fasting/Non Fasting Non Fasting   Method of Blood Draw Venipuncture   Site: Right Arm   Patient Tolerated  Well   Tubes Collected (# and color) 1LAV,1GOLD   Performed By Stormy Card       Electronically signed by Doreatha Lew, MD at 10/14/2022 10:11 AM EST

## 2022-10-21 NOTE — Telephone Encounter (Signed)
Formatting of this note might be different from the original.  Able to reach pt to confirm appt for tomorrow morning at 8am instead of today. Pt confirmed time/date of appt.  Electronically signed by Mertie Moores, RN at 10/21/2022  7:05 AM EST

## 2022-10-22 NOTE — Telephone Encounter (Signed)
Formatting of this note might be different from the original.  Contacted [facility] and scheduled eye exam for 12/27 at 1245pm.       Electronically signed by Audree Camel, RN at 10/22/2022  1:42 PM EST

## 2022-10-22 NOTE — Progress Notes (Signed)
Formatting of this note might be different from the original.  Pt ambulated into tx room alone. She is here for C3 Mirvetuximab soravtansine (Elahere) infusion for Ovarian cancer per Dr. Cristina Gong. Pt identifiers confirmed. Consent noted. Pt reports appetite WNL. Pt reports taking pretx eye drops as MD order. No reports of visual issues. PO fluids WNL. Pt reports diarrhea not relieved by OTC meds at time. Explained would notify MD for possible Lomotil prescription.  Labs performed prior to arrival and WNL to proceed.    Tx completed, Incompatible w/ NS, flushed w/ D5W 38m. VS appears stable for d/c.  Clinic Discharge Note  Documented problems discussed with the physician, Patient monitored closely and remained stable during treatment, Discharge instructions given to patient. All questions were answered., Discharge instructions were reinforced with patient. All questions were answered., Patient verbalized understanding of discharge instructions, Patient instructed to call office with any questions or concerns, Return appointment scheduled and Discharged from clinic  Patient status: alert, oriented, stable and without complaints  Accompanied: self  Teaching: S/S of infection, Hydration, Mouth sores, Unrelieved nausea, vomiting or diarrhea, Phlebitis, Fever/Chills, Specific drug effects, Rash, Neutropenic precautions and Thrombocytopenic precautions  Discharge status: ambulatory      Electronically signed by TMertie Moores RN at 10/22/2022  4:43 PM EST

## 2022-10-22 NOTE — Progress Notes (Signed)
Formatting of this note might be different from the original.  INFUSION CLINICAL MANAGEMENT:    I provided infusion clinical management.  Doreatha Lew, MD  Electronically signed by Doreatha Lew, MD at 11/01/2022 12:28 PM EST

## 2022-10-22 NOTE — Progress Notes (Signed)
Formatting of this note might be different from the original.  error  Electronically signed by Audree Camel, RN at 10/22/2022 12:59 PM EST

## 2022-10-22 NOTE — Telephone Encounter (Signed)
Formatting of this note might be different from the original.  ----- Message from Mertie Moores, RN sent at 10/22/2022  9:06 AM EST -----  Regarding: Concerns  Sue [name],    [name] tx was rescheduled. She saw the eye doctor but they scheduled her for 4 months instead of every other tx. She also has been having diarrhea, can we ask Dr. Cristina Gong for some Lomotil?    Isadore.Human    Electronically signed by Audree Camel, RN at 10/22/2022 12:59 PM EST

## 2022-10-29 ENCOUNTER — Ambulatory Visit
Payer: MEDICARE | Attending: Student in an Organized Health Care Education/Training Program | Primary: Student in an Organized Health Care Education/Training Program

## 2022-10-31 NOTE — Telephone Encounter (Signed)
Formatting of this note might be different from the original.  ----- Message from [facility], [place] sent at 10/31/2022  9:07 AM EST -----  Patient left vm requesting refill of hydrocodone. TY    Electronically signed by Audree Camel, RN at 10/31/2022  9:29 AM EST

## 2022-11-03 NOTE — Progress Notes (Signed)
Formatting of this note is different from the original.  [facility]    ASSESSMENT/PLAN:  1. 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     - Periodic surveillance CT scans unremarkable until 05/24/2019 when new peritoneal carcinomatosis was seen.     - Treatments:       - 6 cycles of Carboplatin + Taxol (06/20/19 - 11/14/19).       - Maintenance Olaparib started 12/15/19 but led to disease progression.       - Second-line treatment with Carbo/Gem and Bevacizumab (02/11/21 - 06/06/21).       - Bevacizumab alone (07/16/21 - 09/27/21); disease progression confirmed on 10/29/2021.       - Received liposomal doxorubicin between 11/12/21 - 05/22/22.     - Most recent scan on 06/06/2022: Post hysterectomy and oophorectomy status with a similar soft tissue mass in the low pelvis.     - CA-125 levels steadily rising, reached 2037 on 05/23/22.     - Discussed single-agent chemotherapy with Taxotere versus hospice; patient declined hospice, stating she's "not ready to give up."     - Baseline CT C/A/P (07/10/22) revealed significant disease in the abdomen-pelvis without frank evidence of pulmonary involvement.     - CT-guided biopsy of right lower quadrant nodule (07/25/22) consistent with adenocarcinoma.     - Patient has >90% of folate receptor alpha (FR?) antibody and considered for mirvetuximab soravtansine (ELAHERE). Discussed potential adverse effects including keratopathy and other ophthalmic complications.     - Cycle 1 of Mirvetuximab [id number](76/73/41) was complicated by acute abdominal spasm refractory to Benadryl, Pepcid, dexamethasone, and patient's home Norco. Infusion was aborted after 157m of drug administration and briefly evaluated at the ER without acute abnormality of bowel obstruction or  perforation.     - S/p cycle 3 (10/22/22) with adequate toleration. CA 125 is pending.     - Treatment assessment CT scan ordered after cycle 4.    2. Recurrent Abdominal Spasms:     - Patient has significant abdominal-pelvic metastasis and peritoneal implants.     - Continue Lyrica.     - Continue Norco.     - Extensive discussion about active cancer-directed therapy versus hospice. Patient expresses strong interest in cancer-directed therapy. Will proceed with premedication as prescribed: Tylenol, dexamethasone, Benadryl, and Pepcid. Patient will take home Lyrica and Norco.    3. Toxicity Monitoring:     - Baseline ophthalmology exam completed and received partial C1 on 09/03/22.     - Mild diarrhea with each meal. Controlled with loperamide p.r.n.     - Continue Gloria ophthalmological exam.    4. Stage III Adenocarcinoma of Right Colon:     - 09/18/2014: Right hemicolectomy and terminal ileoectomy revealed adenocarcinoma of the right colon, pT3N1Mx.     - Patient undergoing regular CT scan reevaluations for ovarian cancer.    5. Cancer-Related Pain:     - Reports worsened pain after biopsy.     - Norco every 6 hours.     - Palliative referral placed.    6. History of AVN of Femoral Heads:     - [facility] ER evaluation (08/23/22) and MRI showed Bilateral AVN of the femoral heads, left worse than right. Advanced degenerative changes in both hips. No occult fracture. Small left hip effusion.     -  Continue Lyrica 274m BID.     - Continue Norco.    7. Malnutrition:     - Oncology RD evaluation.     - Continue Ensure and snacks.       - RTC in 4 weeks.    ONCOLOGY HISTORY  1. Stage III Adenocarcinoma of Right Colon:     - 09/16/2014: CT AP shows a 3.8 cm cecal mass with associated ileocecal intussusception and a 9.1 cm solid and cystic mass in the left pelvis.     - 09/18/2014: Right hemicolectomy and terminal ileoectomy reveal adenocarcinoma of the right colon with certain characteristics and negative nodes.  Pathologic stage: pT3N1Mx.     - 08/06/2016: CT C/AP shows no recurrent malignancy, stable occlusion of left common iliac artery, and pulmonary nodules.     - 03/08/2017: CT C/AP shows no evidence of recurrent malignancy or metastatic disease.     - 11/13/2017: CT AP reveals no findings of recurrent malignancy and mentions other imaging findings.     - 10/22/2020: CT chest shows reduced size of nodules along the right paracolic gutter, compatible with treatment response.     - 01/28/2021: CT C/AP shows changes related to right hemicolectomy and presence of new low-density nodules.     - 06/18/2021: CT C/AP indicates subtle increase in peritoneal thickening, stable appearance of fascial thickening, and avascular necrosis.     - 10/29/2021: CT AP suggests evidence of progression of peritoneal metastasis and an increase in peritoneal thickening.     - 02/06/2022: CT AP reveals unchanged peritoneal thickening and nodularity with stable metastatic disease.     - 06/06/2022: CT AP shows unchanged peritoneal thickening and nodularity, stable metastatic disease, and no evidence of new metastatic disease.    2. Stage IV Ovarian Cancer with Mixed Endometrioid and Clear Cell:  PDL1 20% , FOLR1 90%, ER(90%), PR(2%),MMRp  - 10/09/2014: CT AP identifies a 9.1 cm solid and cystic mass in the left pelvis.  - 11/01/2014: Salpingo-oophorectomy reveals mixed endometrioid and clear cell carcinoma.  - 03/12/2017: R breast nipple biopsy confirms benign tissue.  - 03/22/2018: Bilateral Screening MMG shows no evidence of malignancy.  - 05/24/2019: CT AP reports new peritoneal carcinomatosis in the pelvis and right lower quadrant.  - 06/06/2019: CT Chest shows stable bilateral pulmonary nodules and aortic atherosclerosis.  - 06/07/2019: Peritoneal nodule soft tissue biopsy indicates metastatic carcinoma consistent with ovarian carcinoma.  - 08/25/2019: CT C/AP shows slight interval decrease in the size of the peritoneal nodule.  -  11/29/2019: CT C/AP shows a decreased right lower quadrant peritoneal implant and stable aortic atherosclerosis.  - 04/19/2020: CT AP indicates continued slight interval decrease in the size of a soft tissue nodule and no evidence of new metastatic disease.  - 10/22/2020: CT chest shows a reduced size of a nodule along the right paracolic gutter and mild reduction in a band-like density along the rectosigmoid mesentery.  - 01/28/2021: CT C/AP reveals changes related to right hemicolectomy and new well-circumscribed low-density nodules.  - 06/18/2021: CT C/AP shows nodular fascial thickening with a subtle increase along the right pericolic gutter.  - 10/29/2021: CT AP indicates evidence of progression of peritoneal metastasis.  - 02/06/2022: CT AP shows unchanged peritoneal thickening and nodularity, stable metastatic disease, and no evidence of new metastatic disease.  - 06/06/2022: CT AP reveals status post hysterectomy and oophorectomy, with a similar appearance of a soft tissue mass in the low pelvis.  - 07/10/2022: CT C/AP showed 2 small  noncalcified pulmonary nodules. Severe wall thickening of the rectosigmoid junction with adjacent fat stranding. This may represent sequelae of colonic neoplasm versus drop metastases from reported ovarian cancer. Soft tissue nodule within the inferior left colic gutter which may represent a metastatic implant. Borderline retroperitoneal adenopathy. Complete occlusion of the left iliac and femoral arteries with femoral jump graft in place.    TREATMENT  - 06/20/19 - 11/14/19: Completed 6 cycles of Carboplatin + Taxol.  - 12/15/19: Sue [name] but disease progressed.  - 02/11/21 - 06/06/21: Received Carboplatin/Gemcitabine and Avastin but disease progressed.  - 07/16/21 - 09/27/21: Received Ovarian Bevacizumab but disease progressed.  - 11/12/21 - 05/22/22: Received Ovarian Lipsomal Doxorubicin.    CA-125 Levels:  - 05/02/2019: 360  - 06/16/2019: 499  - 07/11/2019: 438  -  08/05/2019: 272  - 08/26/2019: 189  - 09/26/2019: 137  - 10/03/2019: 137  - 10/31/2019: 101  - 12/23/2019: 55.9  - 01/02/2020: 58.6  - 02/03/2020: 52.2  - 02/24/2020: 64.8  - 08/15/2020: 47.1  - 10/23/2020: 74.6  - 01/21/2021: 825  - 02/18/2021: 161  - 03/04/2021: 541  - 03/11/2021: 568  - 03/25/2021: 367  - 04/08/2021: 273  - 04/29/2021: 431  - 06/06/2021: 096  - 07/17/2021: 996  - 08/06/2021: 975  - 05/23/2022: 2037  - 07/31/2022: 5652    History of Present Illness:  Sue [name] is a very pleasant a 61 year old woman with a medical history of hypertension, successfully treated HCV, peripheral arterial disease, and a prior fem-fem bypass graft presented for evaluation and management of both colon and ovarian cancer.     Her ovarian cancer was diagnosed as Stage IV with a mixed endometrioid and clear cell carcinoma. The initial diagnosis, dating back to 10/09/2014, revealed a sizable 9.1 cm pelvic mass, ultimately confirmed via salpingo-oophorectomy on 11/01/2014. Periodic surveillance CT scans showed no concerning findings until 05/24/2019, when new peritoneal carcinomatosis was observed. Following this, she underwent a series of treatments, including Carboplatin + Taxol for six cycles from 06/20/19 to 11/14/19, maintenance Olaparib from 12/15/19, and second-line Carbo/gem plus Bevacizumab from 02/11/21 to 06/06/21. She continued with Bevacizumab from 07/16/21 to 09/27/21, but disease progression was confirmed on 10/29/2021. Subsequently, she received liposomal doxorubicin from 11/12/21 to 05/22/22. In her most recent CT scan on 06/06/2022, a similar soft tissue mass was observed in the lower pelvis following a prior hysterectomy and oophorectomy. Her CA-125 levels have been consistently rising, reaching 2037 on 05/23/22. The patient is determined to continue active treatment and has opted for Taxotere over hospice, expressing her readiness to fight. She relocated to her hometown to be closer to her support network  of family and friends and is managing anxiety and pain with the use of medical marijuana.    INTERIM HISTORY  - Patient returns for follow-up.  - Denies acute illness or hospitalization.  - Reports feeling clinically good.  - Has tolerated chemotherapy infusion elhare, denies significant adverse effects aside from mild diarrhea, which is controlled with loperamide.  - Reports good appetite and fluid intake, and weight is relatively stable.  - Denies recurrent abdominal pain or improved left-sided pelvic pain.  - Denies constipation.  - Diarrhea is well-controlled.    Past Medical History:   Diagnosis Date    Colon cancer (CMS/HCC)     Hepatitis C infection     treated 2015    Hypertension     Ovarian cancer (CMS/HCC)     PAD (peripheral artery disease) (CMS/HCC)  Past Surgical History:   Procedure Laterality Date    COLON SURGERY      OVARY SURGERY         History reviewed. No pertinent family history.     Social History     Socioeconomic History    Marital status: Married   Tobacco Use    Smoking status: Never    Smokeless tobacco: Never   Vaping Use    Vaping Use: Never used   Substance and Sexual Activity    Alcohol use: Not Currently    Drug use: Yes     Comment: marijuana occasionally    Sexual activity: Defer   Social History Narrative    Drives a car     Allergies   Allergen Reactions    Other Hives and Itching     Highly acidic foods "oranges and tomatoes". Pt breaks out and itches    Penicillins Other (see comments)     Childhood allergy    Hydrocodone Hives    Demerol Hcl [Meperidine] GI intolerance     Nausea/Vomiting    Ibuprofen GI intolerance     Nausea and vomiting    Morphine GI intolerance     Nausea/Vomiting    Oxycodone Itching    Strawberry Itching     Fruit and strawberry flavoring    Tylenol [Acetaminophen] GI intolerance     Nausea and Vomiting     Current Outpatient Medications   Medication Sig Dispense Refill    acetaminophen (TYLENOL) 325 MG tablet Take 325 mg by mouth every 6 (six)  hours as needed for mild pain (PSR 1-3) As needed      amLODIPine (NORVASC) 10 MG tablet Take 10 mg by mouth daily      dexamethasone (DECADRON) 4 MG tablet Take 8 mg daily x 2 days after each chemo day 60 tablet 2    docusate sodium (COLACE) 100 MG capsule Take 100 mg by mouth 2 (two) times a day As needed      HYDROcodone-acetaminophen (NORCO) 5-325 MG per tablet Take 1-2 tablets by mouth every 6 (six) hours as needed for moderate pain (PSR 4-6) 60 tablet 0    lidocaine (LIDODERM) 5 % Apply 1 patch topically daily Remove & discard patch within 12 hours or as directed by MD.      lidocaine-prilocaine (EMLA) cream Apply topically as needed for mild pain (PSR 1-3)      lidocaine-prilocaine (EMLA) cream Apply cream generously over port site and cover with plastic, at least 1 hour prior to each treatment appointment 30 g 2    losartan (COZAAR) 50 MG tablet Take 50 mg by mouth daily      Lyrica 200 MG capsule Take 1 capsule (200 mg total) by mouth 2 (two) times a day 60 capsule 5    methadone (DOLOPHINE) 10 MG tablet       methylPREDNISolone 4 MG tablet therapy pack use as directed FOLLOW DIRECTIONS ON BACK OF FOIL PACK      ondansetron (ZOFRAN) 8 MG tablet Take 8 mg by mouth every 8 (eight) hours as needed for nausea or vomiting As needed      ondansetron (ZOFRAN) 8 MG tablet Take 1 tablet (8 mg total) by mouth every 8 (eight) hours as needed for nausea or vomiting Indications: Nausea and Vomiting caused by Cancer Chemotherapy Start second day after chemo 30 tablet 2    Polyethyl Glyc-Propyl Glyc PF (Lubricant Eye Drops, PF,) 0.4-0.3 % solution Administer 1 drop into  both eyes 4 (four) times a day During treatment with mirvetuximab soravtansine instill 4 times daily and as needed for dry eyes. 10 mL 3    prednisoLONE acetate (PRED FORTE) 1 % ophthalmic suspension Administer 1 drop into both eyes 6 (six) times a day Starting the day prior to each infusion, use one drop in each eye 6 times a day until day 4; then use 1  drop in each eye 4 times a day on days 5-8 of each cycle. 15 mL 2    prochlorperazine (COMPAZINE) 10 MG tablet Take 10 mg by mouth every 6 (six) hours as needed for nausea or vomiting As needed      prochlorperazine (COMPAZINE) 10 MG tablet Take 1 tablet (10 mg total) by mouth every 6 (six) hours as needed (Nausea and Vomiting) 90 tablet 2    pregabalin (LYRICA) 200 MG capsule Take 1 capsule (200 mg total) by mouth 2 (two) times a day 60 capsule 0     No current facility-administered medications for this visit.     Review of Systems   Constitutional:  Positive for activity change, appetite change and unexpected weight change.   HENT: Negative.     Eyes: Negative.    Respiratory: Negative.  Negative for shortness of breath.    Cardiovascular: Negative.  Negative for leg swelling.   Gastrointestinal:  Positive for abdominal pain. Negative for bowel incontinence, constipation, rectal pain and vomiting.   Endocrine: Negative.    Genitourinary: Negative.    Musculoskeletal:  Positive for arthralgias.   Skin: Negative.    Allergic/Immunologic: Negative.    Neurological: Negative.  Negative for dizziness.   Hematological: Negative.  Negative for adenopathy.   Psychiatric/Behavioral: Negative.       Physical Exam:  Vital Signs for this encounter:  BSA: 1.56 meters squared  BP 141/80 (BP Location: Right arm)   Pulse 88   Temp 97.7 F (36.5 C) (Tympanic)   Resp 16   Wt 108 lb 5.7 oz (49.2 kg)   SpO2 99%   BMI 16.97 kg/m     Physical Exam  Constitutional:       Appearance: She is ill-appearing.   HENT:      Head: Normocephalic.      Nose: Nose normal.      Mouth/Throat:      Mouth: Mucous membranes are moist.      Pharynx: Oropharynx is clear.   Eyes:      Extraocular Movements: Extraocular movements intact.      Conjunctiva/sclera: Conjunctivae normal.      Pupils: Pupils are equal, round, and reactive to light.   Cardiovascular:      Rate and Rhythm: Normal rate and regular rhythm.      Pulses: Normal pulses.       Heart sounds: Normal heart sounds.   Pulmonary:      Effort: Pulmonary effort is normal.      Breath sounds: Normal breath sounds.   Abdominal:      General: Abdomen is flat. Bowel sounds are normal.      Palpations: Abdomen is soft.   Musculoskeletal:         General: Normal range of motion.      Cervical back: Normal range of motion and neck supple.      Right lower leg: No edema.      Left lower leg: No edema.   Skin:     General: Skin is warm.      Capillary  Refill: Capillary refill takes less than 2 seconds.   Neurological:      General: No focal deficit present.      Mental Status: She is alert and oriented to person, place, and time.     Results:  White Blood Cell   Date Value Ref Range Status   10/20/2022 4.3 (L) 4.8 - 10.8 k/mcL Final     Neutrophils Automated Absolute Number   Date Value Ref Range Status   10/20/2022 1.6 (L) 1.7 - 8.5 k/mcL Final     Hemoglobin   Date Value Ref Range Status   10/20/2022 12.2 12.0 - 16.0 gm/dL Final     Hematocrit   Date Value Ref Range Status   10/20/2022 36.0 (L) 37.0 - 47.0 % Final     Platelet Count   Date Value Ref Range Status   10/20/2022 205 150 - 450 k/mcL Final     Glucose Random   Date Value Ref Range Status   10/20/2022 84 70 - 120 mg/dL Final     Blood Urea Nitrogen   Date Value Ref Range Status   10/20/2022 14 7 - 24 mg/dL Final     Creatinine   Date Value Ref Range Status   10/20/2022 0.79 0.44 - 1.03 mg/dL Final     eGlomerular Filtration Rate African American   Date Value Ref Range Status   10/20/2022 >60 >=60 mL/min/1.73 m(2) Final     Comment:     Interpretive Data:  Estimated glomerular filtration rate to be used for African American patients based on the MDRD (Modification of Diet in Renal Disease) calculation. Please refer to pharmacy personnel for calculation of a modified Cockcroft-Gault estimated CrCl used in drug dosing protocols.     BUN/Creatinine ratio   Date Value Ref Range Status   10/20/2022 17.7 12.0 - 20.0 Final     Sodium Level   Date  Value Ref Range Status   10/20/2022 143 135 - 148 mmol/L Final     Potassium Level   Date Value Ref Range Status   10/20/2022 4.6 3.5 - 5.5 mmol/L Final     Chloride   Date Value Ref Range Status   10/20/2022 107 100 - 110 mmol/L Final     Carbon Dioxide/CO2   Date Value Ref Range Status   10/20/2022 30 22 - 32 mmol/L Final     Calcium Level Total   Date Value Ref Range Status   10/20/2022 9.6 8.4 - 10.2 mg/dL Final     Protein Total   Date Value Ref Range Status   10/20/2022 7.0 6.2 - 8.3 gm/dL Final     Albumin Level   Date Value Ref Range Status   10/20/2022 4.6 3.4 - 4.8 gm/dL Final     Cancer Antigen 125   Date Value Ref Range Status   09/22/2022 5,820.2 (H) 0.0 - 35.0 U/mL Final     Comment:     The testing method for CA 125 is a chemiluminescent immunoassay manufactured by [employer]. and performed on the Crystal system. Values obtained with different assay methods or kits may be different and cannot be used interchangeably.Test results cannot be interpreted as absolute evidence for the presence or absence of malignant disease.       Electronically signed by Doreatha Lew, MD at 11/03/2022  2:11 PM EST

## 2022-11-03 NOTE — Progress Notes (Signed)
Formatting of this note is different from the original.  Sue HEMATOLOGY-ONCOLOGY CONSULTATION    ASSESSMENT/PLAN:  1. 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     - Periodic surveillance CT scans unremarkable until 05/24/2019 when new peritoneal carcinomatosis was seen.     - Treatments:       - 6 cycles of Carboplatin + Taxol (06/20/19 - 11/14/19).       - Maintenance Olaparib started 12/15/19 but led to disease progression.       - Second-line treatment with Carbo/Gem and Bevacizumab (02/11/21 - 06/06/21).       - Bevacizumab alone (07/16/21 - 09/27/21); disease progression confirmed on 10/29/2021.       - Received liposomal doxorubicin between 11/12/21 - 05/22/22.     - Most recent scan on 06/06/2022: Post hysterectomy and oophorectomy status with a similar soft tissue mass in the low pelvis.     - CA-125 levels steadily rising, reached 2037 on 05/23/22.     - Discussed single-agent chemotherapy with Taxotere versus hospice; patient declined hospice, stating she's "not ready to give up."     - Baseline CT C/A/P (07/10/22) revealed significant disease in the abdomen-pelvis without frank evidence of pulmonary involvement.     - CT-guided biopsy of right lower quadrant nodule (07/25/22) consistent with adenocarcinoma.     - Patient has >90% of folate receptor alpha (FR?) antibody and considered for mirvetuximab soravtansine (ELAHERE). Discussed potential adverse effects including keratopathy and other ophthalmic complications.     - Cycle 1 of Mirvetuximab [id number](76/73/41) was complicated by acute abdominal spasm refractory to Benadryl, Pepcid, dexamethasone, and patient's home Norco. Infusion was aborted after 157m of drug administration and briefly evaluated at the ER without acute abnormality of bowel obstruction or  perforation.     - S/p cycle 3 (10/22/22) with adequate toleration. CA 125 is pending.     - Treatment assessment CT scan ordered after cycle 4.    2. Recurrent Abdominal Spasms:     - Patient has significant abdominal-pelvic metastasis and peritoneal implants.     - Continue Lyrica.     - Continue Norco.     - Extensive discussion about active cancer-directed therapy versus hospice. Patient expresses strong interest in cancer-directed therapy. Will proceed with premedication as prescribed: Tylenol, dexamethasone, Benadryl, and Pepcid. Patient will take home Lyrica and Norco.    3. Toxicity Monitoring:     - Baseline ophthalmology exam completed and received partial C1 on 09/03/22.     - Mild diarrhea with each meal. Controlled with loperamide p.r.n.     - Continue Gloria ophthalmological exam.    4. Stage III Adenocarcinoma of Right Colon:     - 09/18/2014: Right hemicolectomy and terminal ileoectomy revealed adenocarcinoma of the right colon, pT3N1Mx.     - Patient undergoing regular CT scan reevaluations for ovarian cancer.    5. Cancer-Related Pain:     - Reports worsened pain after biopsy.     - Norco every 6 hours.     - Palliative referral placed.    6. History of AVN of Femoral Heads:     - [facility] ER evaluation (08/23/22) and MRI showed Bilateral AVN of the femoral heads, left worse than right. Advanced degenerative changes in both hips. No occult fracture. Small left hip effusion.     -  Continue Lyrica 274m BID.     - Continue Norco.    7. Malnutrition:     - Oncology RD evaluation.     - Continue Ensure and snacks.       - RTC in 4 weeks.    ONCOLOGY HISTORY  1. Stage III Adenocarcinoma of Right Colon:     - 09/16/2014: CT AP shows a 3.8 cm cecal mass with associated ileocecal intussusception and a 9.1 cm solid and cystic mass in the left pelvis.     - 09/18/2014: Right hemicolectomy and terminal ileoectomy reveal adenocarcinoma of the right colon with certain characteristics and negative nodes.  Pathologic stage: pT3N1Mx.     - 08/06/2016: CT C/AP shows no recurrent malignancy, stable occlusion of left common iliac artery, and pulmonary nodules.     - 03/08/2017: CT C/AP shows no evidence of recurrent malignancy or metastatic disease.     - 11/13/2017: CT AP reveals no findings of recurrent malignancy and mentions other imaging findings.     - 10/22/2020: CT chest shows reduced size of nodules along the right paracolic gutter, compatible with treatment response.     - 01/28/2021: CT C/AP shows changes related to right hemicolectomy and presence of new low-density nodules.     - 06/18/2021: CT C/AP indicates subtle increase in peritoneal thickening, stable appearance of fascial thickening, and avascular necrosis.     - 10/29/2021: CT AP suggests evidence of progression of peritoneal metastasis and an increase in peritoneal thickening.     - 02/06/2022: CT AP reveals unchanged peritoneal thickening and nodularity with stable metastatic disease.     - 06/06/2022: CT AP shows unchanged peritoneal thickening and nodularity, stable metastatic disease, and no evidence of new metastatic disease.    2. Stage IV Ovarian Cancer with Mixed Endometrioid and Clear Cell:  PDL1 20% , FOLR1 90%, ER(90%), PR(2%),MMRp  - 10/09/2014: CT AP identifies a 9.1 cm solid and cystic mass in the left pelvis.  - 11/01/2014: Salpingo-oophorectomy reveals mixed endometrioid and clear cell carcinoma.  - 03/12/2017: R breast nipple biopsy confirms benign tissue.  - 03/22/2018: Bilateral Screening MMG shows no evidence of malignancy.  - 05/24/2019: CT AP reports new peritoneal carcinomatosis in the pelvis and right lower quadrant.  - 06/06/2019: CT Chest shows stable bilateral pulmonary nodules and aortic atherosclerosis.  - 06/07/2019: Peritoneal nodule soft tissue biopsy indicates metastatic carcinoma consistent with ovarian carcinoma.  - 08/25/2019: CT C/AP shows slight interval decrease in the size of the peritoneal nodule.  -  11/29/2019: CT C/AP shows a decreased right lower quadrant peritoneal implant and stable aortic atherosclerosis.  - 04/19/2020: CT AP indicates continued slight interval decrease in the size of a soft tissue nodule and no evidence of new metastatic disease.  - 10/22/2020: CT chest shows a reduced size of a nodule along the right paracolic gutter and mild reduction in a band-like density along the rectosigmoid mesentery.  - 01/28/2021: CT C/AP reveals changes related to right hemicolectomy and new well-circumscribed low-density nodules.  - 06/18/2021: CT C/AP shows nodular fascial thickening with a subtle increase along the right pericolic gutter.  - 10/29/2021: CT AP indicates evidence of progression of peritoneal metastasis.  - 02/06/2022: CT AP shows unchanged peritoneal thickening and nodularity, stable metastatic disease, and no evidence of new metastatic disease.  - 06/06/2022: CT AP reveals status post hysterectomy and oophorectomy, with a similar appearance of a soft tissue mass in the low pelvis.  - 07/10/2022: CT C/AP showed 2 small  noncalcified pulmonary nodules. Severe wall thickening of the rectosigmoid junction with adjacent fat stranding. This may represent sequelae of colonic neoplasm versus drop metastases from reported ovarian cancer. Soft tissue nodule within the inferior left colic gutter which may represent a metastatic implant. Borderline retroperitoneal adenopathy. Complete occlusion of the left iliac and femoral arteries with femoral jump graft in place.    TREATMENT  - 06/20/19 - 11/14/19: Completed 6 cycles of Carboplatin + Taxol.  - 12/15/19: Waldron [name] but disease progressed.  - 02/11/21 - 06/06/21: Received Carboplatin/Gemcitabine and Avastin but disease progressed.  - 07/16/21 - 09/27/21: Received Ovarian Bevacizumab but disease progressed.  - 11/12/21 - 05/22/22: Received Ovarian Lipsomal Doxorubicin.    CA-125 Levels:  - 05/02/2019: 360  - 06/16/2019: 499  - 07/11/2019: 438  -  08/05/2019: 272  - 08/26/2019: 189  - 09/26/2019: 137  - 10/03/2019: 137  - 10/31/2019: 101  - 12/23/2019: 55.9  - 01/02/2020: 58.6  - 02/03/2020: 52.2  - 02/24/2020: 64.8  - 08/15/2020: 47.1  - 10/23/2020: 74.6  - 01/21/2021: 825  - 02/18/2021: 161  - 03/04/2021: 541  - 03/11/2021: 568  - 03/25/2021: 367  - 04/08/2021: 273  - 04/29/2021: 431  - 06/06/2021: 096  - 07/17/2021: 996  - 08/06/2021: 975  - 05/23/2022: 2037  - 07/31/2022: 5652    History of Present Illness:  Sue [name] is a very pleasant a 61 year old woman with a medical history of hypertension, successfully treated HCV, peripheral arterial disease, and a prior fem-fem bypass graft presented for evaluation and management of both colon and ovarian cancer.     Her ovarian cancer was diagnosed as Stage IV with a mixed endometrioid and clear cell carcinoma. The initial diagnosis, dating back to 10/09/2014, revealed a sizable 9.1 cm pelvic mass, ultimately confirmed via salpingo-oophorectomy on 11/01/2014. Periodic surveillance CT scans showed no concerning findings until 05/24/2019, when new peritoneal carcinomatosis was observed. Following this, she underwent a series of treatments, including Carboplatin + Taxol for six cycles from 06/20/19 to 11/14/19, maintenance Olaparib from 12/15/19, and second-line Carbo/gem plus Bevacizumab from 02/11/21 to 06/06/21. She continued with Bevacizumab from 07/16/21 to 09/27/21, but disease progression was confirmed on 10/29/2021. Subsequently, she received liposomal doxorubicin from 11/12/21 to 05/22/22. In her most recent CT scan on 06/06/2022, a similar soft tissue mass was observed in the lower pelvis following a prior hysterectomy and oophorectomy. Her CA-125 levels have been consistently rising, reaching 2037 on 05/23/22. The patient is determined to continue active treatment and has opted for Taxotere over hospice, expressing her readiness to fight. She relocated to her hometown to be closer to her support network  of family and friends and is managing anxiety and pain with the use of medical marijuana.    INTERIM HISTORY  - Patient returns for follow-up.  - Denies acute illness or hospitalization.  - Reports feeling clinically good.  - Has tolerated chemotherapy infusion elhare, denies significant adverse effects aside from mild diarrhea, which is controlled with loperamide.  - Reports good appetite and fluid intake, and weight is relatively stable.  - Denies recurrent abdominal pain or improved left-sided pelvic pain.  - Denies constipation.  - Diarrhea is well-controlled.    Past Medical History:   Diagnosis Date    Colon cancer (CMS/HCC)     Hepatitis C infection     treated 2015    Hypertension     Ovarian cancer (CMS/HCC)     PAD (peripheral artery disease) (CMS/HCC)  Past Surgical History:   Procedure Laterality Date    COLON SURGERY      OVARY SURGERY         History reviewed. No pertinent family history.     Social History     Socioeconomic History    Marital status: Married   Tobacco Use    Smoking status: Never    Smokeless tobacco: Never   Vaping Use    Vaping Use: Never used   Substance and Sexual Activity    Alcohol use: Not Currently    Drug use: Yes     Comment: marijuana occasionally    Sexual activity: Defer   Social History Narrative    Drives a car     Allergies   Allergen Reactions    Other Hives and Itching     Highly acidic foods "oranges and tomatoes". Pt breaks out and itches    Penicillins Other (see comments)     Childhood allergy    Hydrocodone Hives    Demerol Hcl [Meperidine] GI intolerance     Nausea/Vomiting    Ibuprofen GI intolerance     Nausea and vomiting    Morphine GI intolerance     Nausea/Vomiting    Oxycodone Itching    Strawberry Itching     Fruit and strawberry flavoring    Tylenol [Acetaminophen] GI intolerance     Nausea and Vomiting     Current Outpatient Medications   Medication Sig Dispense Refill    acetaminophen (TYLENOL) 325 MG tablet Take 325 mg by mouth every 6 (six)  hours as needed for mild pain (PSR 1-3) As needed      amLODIPine (NORVASC) 10 MG tablet Take 10 mg by mouth daily      dexamethasone (DECADRON) 4 MG tablet Take 8 mg daily x 2 days after each chemo day 60 tablet 2    docusate sodium (COLACE) 100 MG capsule Take 100 mg by mouth 2 (two) times a day As needed      HYDROcodone-acetaminophen (NORCO) 5-325 MG per tablet Take 1-2 tablets by mouth every 6 (six) hours as needed for moderate pain (PSR 4-6) 60 tablet 0    lidocaine (LIDODERM) 5 % Apply 1 patch topically daily Remove & discard patch within 12 hours or as directed by MD.      lidocaine-prilocaine (EMLA) cream Apply topically as needed for mild pain (PSR 1-3)      lidocaine-prilocaine (EMLA) cream Apply cream generously over port site and cover with plastic, at least 1 hour prior to each treatment appointment 30 g 2    losartan (COZAAR) 50 MG tablet Take 50 mg by mouth daily      Lyrica 200 MG capsule Take 1 capsule (200 mg total) by mouth 2 (two) times a day 60 capsule 5    methadone (DOLOPHINE) 10 MG tablet       methylPREDNISolone 4 MG tablet therapy pack use as directed FOLLOW DIRECTIONS ON BACK OF FOIL PACK      ondansetron (ZOFRAN) 8 MG tablet Take 8 mg by mouth every 8 (eight) hours as needed for nausea or vomiting As needed      ondansetron (ZOFRAN) 8 MG tablet Take 1 tablet (8 mg total) by mouth every 8 (eight) hours as needed for nausea or vomiting Indications: Nausea and Vomiting caused by Cancer Chemotherapy Start second day after chemo 30 tablet 2    Polyethyl Glyc-Propyl Glyc PF (Lubricant Eye Drops, PF,) 0.4-0.3 % solution Administer 1 drop into  both eyes 4 (four) times a day During treatment with mirvetuximab soravtansine instill 4 times daily and as needed for dry eyes. 10 mL 3    prednisoLONE acetate (PRED FORTE) 1 % ophthalmic suspension Administer 1 drop into both eyes 6 (six) times a day Starting the day prior to each infusion, use one drop in each eye 6 times a day until day 4; then use 1  drop in each eye 4 times a day on days 5-8 of each cycle. 15 mL 2    prochlorperazine (COMPAZINE) 10 MG tablet Take 10 mg by mouth every 6 (six) hours as needed for nausea or vomiting As needed      prochlorperazine (COMPAZINE) 10 MG tablet Take 1 tablet (10 mg total) by mouth every 6 (six) hours as needed (Nausea and Vomiting) 90 tablet 2    pregabalin (LYRICA) 200 MG capsule Take 1 capsule (200 mg total) by mouth 2 (two) times a day 60 capsule 0     No current facility-administered medications for this visit.     Review of Systems   Constitutional:  Positive for activity change, appetite change and unexpected weight change.   HENT: Negative.     Eyes: Negative.    Respiratory: Negative.  Negative for shortness of breath.    Cardiovascular: Negative.  Negative for leg swelling.   Gastrointestinal:  Positive for abdominal pain. Negative for bowel incontinence, constipation, rectal pain and vomiting.   Endocrine: Negative.    Genitourinary: Negative.    Musculoskeletal:  Positive for arthralgias.   Skin: Negative.    Allergic/Immunologic: Negative.    Neurological: Negative.  Negative for dizziness.   Hematological: Negative.  Negative for adenopathy.   Psychiatric/Behavioral: Negative.       Physical Exam:  Vital Signs for this encounter:  BSA: 1.56 meters squared  BP 141/80 (BP Location: Right arm)   Pulse 88   Temp 97.7 F (36.5 C) (Tympanic)   Resp 16   Wt 108 lb 5.7 oz (49.2 kg)   SpO2 99%   BMI 16.97 kg/m     Physical Exam  Constitutional:       Appearance: She is ill-appearing.   HENT:      Head: Normocephalic.      Nose: Nose normal.      Mouth/Throat:      Mouth: Mucous membranes are moist.      Pharynx: Oropharynx is clear.   Eyes:      Extraocular Movements: Extraocular movements intact.      Conjunctiva/sclera: Conjunctivae normal.      Pupils: Pupils are equal, round, and reactive to light.   Cardiovascular:      Rate and Rhythm: Normal rate and regular rhythm.      Pulses: Normal pulses.       Heart sounds: Normal heart sounds.   Pulmonary:      Effort: Pulmonary effort is normal.      Breath sounds: Normal breath sounds.   Abdominal:      General: Abdomen is flat. Bowel sounds are normal.      Palpations: Abdomen is soft.   Musculoskeletal:         General: Normal range of motion.      Cervical back: Normal range of motion and neck supple.      Right lower leg: No edema.      Left lower leg: No edema.   Skin:     General: Skin is warm.      Capillary  Refill: Capillary refill takes less than 2 seconds.   Neurological:      General: No focal deficit present.      Mental Status: She is alert and oriented to person, place, and time.     Results:  White Blood Cell   Date Value Ref Range Status   10/20/2022 4.3 (L) 4.8 - 10.8 k/mcL Final     Neutrophils Automated Absolute Number   Date Value Ref Range Status   10/20/2022 1.6 (L) 1.7 - 8.5 k/mcL Final     Hemoglobin   Date Value Ref Range Status   10/20/2022 12.2 12.0 - 16.0 gm/dL Final     Hematocrit   Date Value Ref Range Status   10/20/2022 36.0 (L) 37.0 - 47.0 % Final     Platelet Count   Date Value Ref Range Status   10/20/2022 205 150 - 450 k/mcL Final     Glucose Random   Date Value Ref Range Status   10/20/2022 84 70 - 120 mg/dL Final     Blood Urea Nitrogen   Date Value Ref Range Status   10/20/2022 14 7 - 24 mg/dL Final     Creatinine   Date Value Ref Range Status   10/20/2022 0.79 0.44 - 1.03 mg/dL Final     eGlomerular Filtration Rate African American   Date Value Ref Range Status   10/20/2022 >60 >=60 mL/min/1.73 m(2) Final     Comment:     Interpretive Data:  Estimated glomerular filtration rate to be used for African American patients based on the MDRD (Modification of Diet in Renal Disease) calculation. Please refer to pharmacy personnel for calculation of a modified Cockcroft-Gault estimated CrCl used in drug dosing protocols.     BUN/Creatinine ratio   Date Value Ref Range Status   10/20/2022 17.7 12.0 - 20.0 Final     Sodium Level   Date  Value Ref Range Status   10/20/2022 143 135 - 148 mmol/L Final     Potassium Level   Date Value Ref Range Status   10/20/2022 4.6 3.5 - 5.5 mmol/L Final     Chloride   Date Value Ref Range Status   10/20/2022 107 100 - 110 mmol/L Final     Carbon Dioxide/CO2   Date Value Ref Range Status   10/20/2022 30 22 - 32 mmol/L Final     Calcium Level Total   Date Value Ref Range Status   10/20/2022 9.6 8.4 - 10.2 mg/dL Final     Protein Total   Date Value Ref Range Status   10/20/2022 7.0 6.2 - 8.3 gm/dL Final     Albumin Level   Date Value Ref Range Status   10/20/2022 4.6 3.4 - 4.8 gm/dL Final     Cancer Antigen 125   Date Value Ref Range Status   09/22/2022 5,820.2 (H) 0.0 - 35.0 U/mL Final     Comment:     The testing method for CA 125 is a chemiluminescent immunoassay manufactured by [employer]. and performed on the Crystal system. Values obtained with different assay methods or kits may be different and cannot be used interchangeably.Test results cannot be interpreted as absolute evidence for the presence or absence of malignant disease.       Electronically signed by Doreatha Lew, MD at 11/03/2022  2:11 PM EST

## 2022-11-11 NOTE — Addendum Note (Signed)
Addended by: Epimenio Foot on: 11/14/2022 04:57 PM     Modules accepted: Orders      Electronically signed by Zeb Comfort, NP at 11/14/2022  4:57 PM EST

## 2022-11-11 NOTE — Addendum Note (Signed)
Addended by: Epimenio Foot on: 11/14/2022 04:58 PM     Modules accepted: Orders      Electronically signed by Zeb Comfort, NP at 11/14/2022  4:58 PM EST

## 2022-11-11 NOTE — Progress Notes (Signed)
Formatting of this note is different from the original.     11/11/22 0907   Lab/Nurse Draw Documentation   Fasting/Non Fasting Non Fasting   Method of Blood Draw Venipuncture   Site: Right Arm   Patient Tolerated  Well   Tubes Collected (# and color) 1LAV,1GOLD   Performed By Stormy Card       Electronically signed by Zeb Comfort, NP at 11/14/2022 10:08 AM EST

## 2022-11-12 NOTE — Progress Notes (Signed)
Formatting of this note might be different from the original.  INFUSION CLINICAL MANAGEMENT:    I provided infusion clinical management.  Doreatha Lew, MD  Electronically signed by Doreatha Lew, MD at 11/22/2022  4:24 PM EST

## 2022-11-12 NOTE — Progress Notes (Signed)
Formatting of this note might be different from the original.  Images from the original note were not included.  Pt ambulated into tx room alone. She is here for C4 Mirvetuximab soravtansine (Elahere) infusion for Ovarian cancer per Dr. Cristina Gong. Pt identifiers confirmed. Consent noted. Pt appears w/ hypertension upon arrival. Pt reports, "I only took 1/2 of my blood pressure medications. I brought them with me and I will take them when it's time." Pt reports appetite WNL. Pt reports taking pretx eye drops as MD order. No reports of visual issues. PO fluids WNL. Pt still reports diarrhea not relieved by OTC meds at time. Home meds reviewed and noted pt had Lomotil ordered but had not picked up script from the pharmacy. Reports, "I am going to pick that up on my way home". Pt had labs performed prior to arrival. ANC=1.4/low Mg+=1.5. Pt reports no sx of hypomagnesemia. Reviewed neutropenic precautions and s/e of low Mg+, verbalizes understanding. Message sent to DOD regarding directives of tx.    Tx completed, Incompatible w/ NS, flushed w/ D5W 24m. Pt took oral BP medications. Pt states ability to recheck at home and ready for d/c (per pt).  Clinic Discharge Note  Documented problems discussed with the physician, Patient monitored closely and remained stable during treatment, Discharge instructions given to patient. All questions were answered., Discharge instructions were reinforced with patient. All questions were answered., Patient verbalized understanding of discharge instructions, Patient instructed to call office with any questions or concerns, Return appointment scheduled and Discharged from clinic  Patient status: alert, oriented, stable and without complaints  Accompanied: self  Teaching: S/S of infection, Hydration, Mouth sores, Unrelieved nausea, vomiting or diarrhea, Phlebitis, Fever/Chills, Specific drug effects, Rash, Neutropenic precautions and Thrombocytopenic precautions  Discharge status:  ambulatory      Electronically signed by TMertie Moores RN at 11/12/2022  4:23 PM EST

## 2022-11-18 NOTE — Telephone Encounter (Signed)
Formatting of this note might be different from the original.  ----- Message from Zeb Comfort, NP sent at 11/14/2022  4:57 PM EST -----  Please let her know Mag is low. I sent in 2 weeks BID. Can we repeat level in 2 weeks?    Chrys Racer   ----- Message -----  From: Lab, Background User  Sent: 11/11/2022   9:25 AM EST  To: Doreatha Lew, MD      Electronically signed by Audree Camel, RN at 11/18/2022  9:46 AM EST

## 2022-11-28 NOTE — Telephone Encounter (Signed)
Pt called and states she is experiencing a lot of issues with diarrhea after her chemo treatments.  It looks in the last note she was having issues after eating and pepto was helping but she states now she is only experiencing it after chemo and the pepto does not help.  I let her know I would forward to Dr. Talbert Nan for his recommendations. I have verified and updated her pharmacy to [facility] if something needs to be sent in.

## 2022-12-02 NOTE — Telephone Encounter (Signed)
I left message for pt to call back to relay Dr. Gentry Fitz recommendations to her.

## 2022-12-02 NOTE — Progress Notes (Signed)
Formatting of this note is different from the original.     12/02/22 0845   Lab/Nurse Draw Documentation   Fasting/Non Fasting Non Fasting   Method of Blood Draw Venipuncture   Site: Right Arm   Patient Tolerated  Well   Tubes Collected (# and color) 1LAV,1TIGER   Performed By Stormy Card       Electronically signed by Doreatha Lew, MD at 12/02/2022 10:25 AM EST

## 2022-12-03 NOTE — Progress Notes (Signed)
Formatting of this note is different from the original.  [facility]    ASSESSMENT/PLAN:  1. 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  - Periodic surveillance CT scans unremarkable until 05/24/2019 when new peritoneal carcinomatosis was seen.  - Received the following treatments:    - 6 cycles of Carboplatin + Taxol (06/20/19 - 11/14/19).    - Maintenance Olaparib started 12/15/19 but led to disease progression.    - Second-line treatment with Carbo/Gem and Bevacizumab (02/11/21 - 06/06/21).    - Bevacizumab alone (07/16/21 - 09/27/21); disease progression confirmed on 10/29/2021.    - Received liposomal doxorubicin between 11/12/21 - 05/22/22.  - Most recent scan on 06/06/2022: Post hysterectomy and oophorectomy status with a similar soft tissue mass in the low pelvis.  - CA-125 levels steadily rising, reached 2037 on 05/23/22.  - Discussed single-agent chemotherapy with Taxotere versus hospice; patient declined hospice, stating she's "not ready to give up."  - Baseline CT C/A/P (07/10/22) revealed significant disease in the abdomen-pelvis without frank evidence of pulmonary involvement.  - CT-guided biopsy of right lower quadrant nodule (07/25/22) consistent with adenocarcinoma.  - Patient has >90% of folate receptor alpha (FR?) antibody and considered for mirvetuximab soravtansine (ELAHERE). Discussed potential adverse effects including keratopathy and other ophthalmic complications.  - Cycle 1 of Mirvetuximab soravtansine (28/41/32) was complicated by acute abdominal spasm refractory to Benadryl, Pepcid, dexamethasone, and patient's home Norco. Infusion was aborted after 166m of drug administration and briefly evaluated at the ER without acute abnormality of bowel obstruction or perforation.  - Cycle 4  Mirvetuximab soravtansine (11/12/22) was adequately tolerated, and C5 administered today. CA 125 trended down from 5000s to 1,564.3 (12/02/22).  - CT scan (11/21/22) showed enlarged left periaortic lymph node and persistent rectosigmoid colon wall thickening, otherwise stable findings.    2. Recurrent Abdominal Spasms:     - Patient has significant abdominal-pelvic metastasis and peritoneal implants.     - Continue Lyrica.     - Continue Norco.     - Extensive discussion about active cancer-directed therapy versus hospice. Patient expresses strong interest in cancer-directed therapy and proceed with premedication as prescribed: Tylenol, dexamethasone, Benadryl, and Pepcid. Patient will take home Lyrica and Norco.    3. Toxicity Monitoring:     - Baseline ophthalmology exam completed and received partial C1 on 09/03/22.     - Mild diarrhea with each meal. Controlled with loperamide p.r.n.     - Continue regular  ophthalmological exam.    4. Stage III Adenocarcinoma of Right Colon:     - 09/18/2014: Right hemicolectomy and terminal ileoectomy revealed adenocarcinoma of the right colon, pT3N1Mx.     - Patient undergoing regular CT scan reevaluations for ovarian cancer.     - CT scan (11/21/22) showed rectosigmoid colon wall thickening concerning for colonic neoplasm     - Urgent GI referral placed    5. Cancer-Related Pain:     - Reports worsened pain after biopsy.     - Norco every 6 hours.     - Palliative referral placed.    6. History of AVN of Femoral Heads:     - [facility] ER evaluation (08/23/22) and MRI showed Bilateral AVN of the femoral heads, left worse than right. Advanced degenerative changes in both hips. No occult fracture. Small left hip effusion.     -  Continue Lyrica '200mg'$  BID.     - Continue Norco.    7. Malnutrition:     - Oncology RD evaluation.     - Continue Ensure and snacks.       - RTC in 4 weeks.    ONCOLOGY HISTORY  1. Stage III Adenocarcinoma of Right Colon:     - 09/16/2014: CT AP shows a 3.8  cm cecal mass with associated ileocecal intussusception and a 9.1 cm solid and cystic mass in the left pelvis.     - 09/18/2014: Right hemicolectomy and terminal ileoectomy reveal adenocarcinoma of the right colon with certain characteristics and negative nodes. Pathologic stage: pT3N1Mx.     - 08/06/2016: CT C/AP shows no recurrent malignancy, stable occlusion of left common iliac artery, and pulmonary nodules.     - 03/08/2017: CT C/AP shows no evidence of recurrent malignancy or metastatic disease.     - 11/13/2017: CT AP reveals no findings of recurrent malignancy and mentions other imaging findings.     - 10/22/2020: CT chest shows reduced size of nodules along the right paracolic gutter, compatible with treatment response.     - 01/28/2021: CT C/AP shows changes related to right hemicolectomy and presence of new low-density nodules.     - 06/18/2021: CT C/AP indicates subtle increase in peritoneal thickening, stable appearance of fascial thickening, and avascular necrosis.     - 10/29/2021: CT AP suggests evidence of progression of peritoneal metastasis and an increase in peritoneal thickening.     - 02/06/2022: CT AP reveals unchanged peritoneal thickening and nodularity with stable metastatic disease.     - 06/06/2022: CT AP shows unchanged peritoneal thickening and nodularity, stable metastatic disease, and no evidence of new metastatic disease.    2. Stage IV Ovarian Cancer with Mixed Endometrioid and Clear Cell:  PDL1 20% , FOLR1 90%, ER(90%), PR(2%),MMRp  - 10/09/2014: CT AP identifies a 9.1 cm solid and cystic mass in the left pelvis.  - 11/01/2014: Salpingo-oophorectomy reveals mixed endometrioid and clear cell carcinoma.  - 03/12/2017: R breast nipple biopsy confirms benign tissue.  - 03/22/2018: Bilateral Screening MMG shows no evidence of malignancy.  - 05/24/2019: CT AP reports new peritoneal carcinomatosis in the pelvis and right lower quadrant.  - 06/06/2019: CT Chest shows stable bilateral  pulmonary nodules and aortic atherosclerosis.  - 06/07/2019: Peritoneal nodule soft tissue biopsy indicates metastatic carcinoma consistent with ovarian carcinoma.  - 08/25/2019: CT C/AP shows slight interval decrease in the size of the peritoneal nodule.  - 11/29/2019: CT C/AP shows a decreased right lower quadrant peritoneal implant and stable aortic atherosclerosis.  - 04/19/2020: CT AP indicates continued slight interval decrease in the size of a soft tissue nodule and no evidence of new metastatic disease.  - 10/22/2020: CT chest shows a reduced size of a nodule along the right paracolic gutter and mild reduction in a band-like density along the rectosigmoid mesentery.  - 01/28/2021: CT C/AP reveals changes related to right hemicolectomy and new well-circumscribed low-density nodules.  - 06/18/2021: CT C/AP shows nodular fascial thickening with a subtle increase along the right pericolic gutter.  - 10/29/2021: CT AP indicates evidence of progression of peritoneal metastasis.  - 02/06/2022: CT AP shows unchanged peritoneal thickening and nodularity, stable metastatic disease, and no evidence of new metastatic disease.  - 06/06/2022: CT AP reveals status post hysterectomy and oophorectomy, with a similar appearance of a soft tissue mass in the low pelvis.  - 07/10/2022: CT C/AP showed 2 small  noncalcified pulmonary nodules. Severe wall thickening of the rectosigmoid junction with adjacent fat stranding. This may represent sequelae of colonic neoplasm versus drop metastases from reported ovarian cancer. Soft tissue nodule within the inferior left colic gutter which may represent a metastatic implant. Borderline retroperitoneal adenopathy. Complete occlusion of the left iliac and femoral arteries with femoral jump graft in place.  - 11/21/2022: CT C/AP shows persistent rectosigmoid colon wall thickening, enlarged left periaortic lymph node. Stable left inguinal lymphadenopathy and bilateral upper lobe pulmonary  nodules    TREATMENT  - 06/20/19 - 11/14/19: Completed 6 cycles of Carboplatin + Taxol.  - 12/15/19: Waldron Session but disease progressed.  - 02/11/21 - 06/06/21: Received Carboplatin/Gemcitabine and Avastin but disease progressed.  - 07/16/21 - 09/27/21: Received Ovarian Bevacizumab but disease progressed.  - 11/12/21 - 05/22/22: Received Ovarian Lipsomal Doxorubicin.  - 09/03/22 - C1 Mirvetuximab soravtansine (interrupted due to severe abdominal pain)  - 09/24/22 - C2 Mirvetuximab soravtansine (rechallenged)  - 10/22/22 - C3 Mirvetuximab soravtansine   - 11/12/22 - C4 Mirvetuximab soravtansine   - 09/03/23- C5 Mirvetuximab     CA-125 Levels:  - 05/02/2019: 360  - 06/16/2019: 499  - 07/11/2019: 438  - 08/05/2019: 272  - 08/26/2019: 189  - 09/26/2019: 137  - 10/03/2019: 137  - 10/31/2019: 101  - 12/23/2019: 55.9  - 01/02/2020: 58.6  - 02/03/2020: 52.2  - 02/24/2020: 64.8  - 08/15/2020: 47.1  - 10/23/2020: 74.6  - 01/21/2021: 825  - 02/18/2021: 244  - 03/04/2021: 010  - 03/11/2021: 568  - 03/25/2021: 367  - 04/08/2021: 273  - 04/29/2021: 431  - 06/06/2021: 594  - 07/17/2021: 996  - 08/06/2021: 975  - 05/23/2022: 2037  - 07/31/2022: 2725  - 09/22/2022: 5820  - 11/03/2022: 3072  - 11/11/2022: 2194  - 12/02/2022: 3664    History of Present Illness:  Ms. Sue [name] is a very pleasant a 62 year old woman with a medical history of hypertension, successfully treated HCV, peripheral arterial disease, and a prior fem-fem bypass graft presented for evaluation and management of both colon and ovarian cancer.     Her ovarian cancer was diagnosed as Stage IV with a mixed endometrioid and clear cell carcinoma. The initial diagnosis, dating back to 10/09/2014, revealed a sizable 9.1 cm pelvic mass, ultimately confirmed via salpingo-oophorectomy on 11/01/2014. Periodic surveillance CT scans showed no concerning findings until 05/24/2019, when new peritoneal carcinomatosis was observed. Following this, she underwent a series of  treatments, including Carboplatin + Taxol for six cycles from 06/20/19 to 11/14/19, maintenance Olaparib from 12/15/19, and second-line Carbo/gem plus Bevacizumab from 02/11/21 to 06/06/21. She continued with Bevacizumab from 07/16/21 to 09/27/21, but disease progression was confirmed on 10/29/2021. Subsequently, she received liposomal doxorubicin from 11/12/21 to 05/22/22. In her most recent CT scan on 06/06/2022, a similar soft tissue mass was observed in the lower pelvis following a prior hysterectomy and oophorectomy. Her CA-125 levels have been consistently rising, reaching 2037 on 05/23/22. The patient is determined to continue active treatment and has opted for Taxotere over hospice, expressing her readiness to fight. She relocated to her hometown to be closer to her support network of family and friends and is managing anxiety and pain with the use of medical marijuana.    INTERIM HISTORY  - Patient returns for follow-up.  - Denies acute illness or hospitalization.  - Just received cycle 5. Feels a bit groggy from Benadryl.  - Has tolerated Eltare infusion well, denies significant adverse effects aside from mild diarrhea.  -  Reports good appetite, fluid intake, and weight is relatively stable.  - Denies recurrent abdominal pain or improved left-sided pelvic pain.  - Diarrhea is well-controlled.    Past Medical History:   Diagnosis Date    Colon cancer (CMS/HCC)     Hepatitis C infection     treated 2015    Hypertension     Ovarian cancer (CMS/HCC)     PAD (peripheral artery disease) (CMS/HCC)      Past Surgical History:   Procedure Laterality Date    COLON SURGERY      OVARY SURGERY         No family history on file.     Social History     Socioeconomic History    Marital status: Married   Tobacco Use    Smoking status: Never    Smokeless tobacco: Never   Vaping Use    Vaping Use: Never used   Substance and Sexual Activity    Alcohol use: Not Currently    Drug use: Yes     Comment: marijuana occasionally     Sexual activity: Defer   Social History Narrative    Drives a car     Allergies   Allergen Reactions    Other Hives and Itching     Highly acidic foods "oranges and tomatoes". Pt breaks out and itches    Penicillins Other (see comments)     Childhood allergy    Hydrocodone Hives    Demerol Hcl [Meperidine] GI intolerance     Nausea/Vomiting    Ibuprofen GI intolerance     Nausea and vomiting    Morphine GI intolerance     Nausea/Vomiting    Oxycodone Itching    Strawberry Itching     Fruit and strawberry flavoring    Tylenol [Acetaminophen] GI intolerance     Nausea and Vomiting     Current Outpatient Medications   Medication Sig Dispense Refill    acetaminophen (TYLENOL) 325 MG tablet Take 325 mg by mouth every 6 (six) hours as needed for mild pain (PSR 1-3) As needed      amLODIPine (NORVASC) 10 MG tablet Take 10 mg by mouth daily      dexamethasone (DECADRON) 4 MG tablet Take 8 mg daily x 2 days after each chemo day 60 tablet 2    docusate sodium (COLACE) 100 MG capsule Take 100 mg by mouth 2 (two) times a day As needed      HYDROcodone-acetaminophen (NORCO) 5-325 MG per tablet Take 1-2 tablets by mouth every 6 (six) hours as needed for moderate pain (PSR 4-6) 60 tablet 0    lidocaine (LIDODERM) 5 % Apply 1 patch topically daily Remove & discard patch within 12 hours or as directed by MD.      lidocaine-prilocaine (EMLA) cream Apply topically as needed for mild pain (PSR 1-3)      lidocaine-prilocaine (EMLA) cream Apply cream generously over port site and cover with plastic, at least 1 hour prior to each treatment appointment 30 g 2    losartan (COZAAR) 50 MG tablet Take 50 mg by mouth daily      Lyrica 200 MG capsule Take 1 capsule (200 mg total) by mouth 2 (two) times a day 60 capsule 5    Magnesium ([employer] TRIPLE MAGNESIUM COMPLEX) 400 MG capsule Take 400 mg by mouth 2 (two) times a day 56 capsule 0    methadone (DOLOPHINE) 10 MG tablet       methylPREDNISolone 4  MG tablet therapy pack use as directed FOLLOW  DIRECTIONS ON BACK OF FOIL PACK      ondansetron (ZOFRAN) 8 MG tablet Take 8 mg by mouth every 8 (eight) hours as needed for nausea or vomiting As needed      ondansetron (ZOFRAN) 8 MG tablet Take 1 tablet (8 mg total) by mouth every 8 (eight) hours as needed for nausea or vomiting Indications: Nausea and Vomiting caused by Cancer Chemotherapy Start second day after chemo 30 tablet 2    Polyethyl Glyc-Propyl Glyc PF (Lubricant Eye Drops, PF,) 0.4-0.3 % solution Administer 1 drop into both eyes 4 (four) times a day During treatment with mirvetuximab soravtansine instill 4 times daily and as needed for dry eyes. 10 mL 3    prednisoLONE acetate (PRED FORTE) 1 % ophthalmic suspension Administer 1 drop into both eyes 6 (six) times a day Starting the day prior to each infusion, use one drop in each eye 6 times a day until day 4; then use 1 drop in each eye 4 times a day on days 5-8 of each cycle. 15 mL 2    pregabalin (LYRICA) 200 MG capsule Take 1 capsule (200 mg total) by mouth 2 (two) times a day 60 capsule 0    prochlorperazine (COMPAZINE) 10 MG tablet Take 10 mg by mouth every 6 (six) hours as needed for nausea or vomiting As needed      prochlorperazine (COMPAZINE) 10 MG tablet Take 1 tablet (10 mg total) by mouth every 6 (six) hours as needed (Nausea and Vomiting) 90 tablet 2     No current facility-administered medications for this visit.     Review of Systems   Constitutional:  Positive for activity change, appetite change and unexpected weight change.   HENT: Negative.     Eyes: Negative.    Respiratory: Negative.  Negative for shortness of breath.    Cardiovascular: Negative.  Negative for leg swelling.   Gastrointestinal:  Positive for abdominal pain. Negative for bowel incontinence, constipation, rectal pain and vomiting.   Endocrine: Negative.    Genitourinary: Negative.    Musculoskeletal:  Positive for arthralgias.   Skin: Negative.    Allergic/Immunologic: Negative.    Neurological: Negative.  Negative  for dizziness.   Hematological: Negative.  Negative for adenopathy.   Psychiatric/Behavioral: Negative.       Physical Exam:  Vital Signs for this encounter:  BSA: There is no height or weight on file to calculate BSA.  There were no vitals taken for this visit.    Physical Exam  Constitutional:       Appearance: She is ill-appearing.   HENT:      Head: Normocephalic.      Nose: Nose normal.      Mouth/Throat:      Mouth: Mucous membranes are moist.      Pharynx: Oropharynx is clear.   Eyes:      Extraocular Movements: Extraocular movements intact.      Conjunctiva/sclera: Conjunctivae normal.      Pupils: Pupils are equal, round, and reactive to light.   Cardiovascular:      Rate and Rhythm: Normal rate and regular rhythm.      Pulses: Normal pulses.      Heart sounds: Normal heart sounds.   Pulmonary:      Effort: Pulmonary effort is normal.      Breath sounds: Normal breath sounds.   Abdominal:      General: Abdomen is flat. Bowel sounds  are normal.      Palpations: Abdomen is soft.   Musculoskeletal:         General: Normal range of motion.      Cervical back: Normal range of motion and neck supple.      Right lower leg: No edema.      Left lower leg: No edema.   Skin:     General: Skin is warm.      Capillary Refill: Capillary refill takes less than 2 seconds.   Neurological:      General: No focal deficit present.      Mental Status: She is alert and oriented to person, place, and time.     Results:  White Blood Cell   Date Value Ref Range Status   12/02/2022 4.2 (L) 4.8 - 10.8 k/mcL Final     Neutrophils Automated Absolute Number   Date Value Ref Range Status   12/02/2022 2.1 1.7 - 8.5 k/mcL Final     Hemoglobin   Date Value Ref Range Status   12/02/2022 12.6 12.0 - 16.0 gm/dL Final     Hematocrit   Date Value Ref Range Status   12/02/2022 36.1 (L) 37.0 - 47.0 % Final     Platelet Count   Date Value Ref Range Status   12/02/2022 182 150 - 450 k/mcL Final     Glucose Random   Date Value Ref Range Status    12/02/2022 97 70 - 120 mg/dL Final     Blood Urea Nitrogen   Date Value Ref Range Status   12/02/2022 21 7 - 24 mg/dL Final     Creatinine   Date Value Ref Range Status   12/02/2022 0.79 0.44 - 1.03 mg/dL Final     eGlomerular Filtration Rate African American   Date Value Ref Range Status   12/02/2022 >60 >=60 mL/min/1.73 m(2) Final     Comment:     Interpretive Data:  Estimated glomerular filtration rate to be used for African American patients based on the MDRD (Modification of Diet in Renal Disease) calculation. Please refer to pharmacy personnel for calculation of a modified Cockcroft-Gault estimated CrCl used in drug dosing protocols.     BUN/Creatinine ratio   Date Value Ref Range Status   12/02/2022 26.6 (H) 12.0 - 20.0 Final     Sodium Level   Date Value Ref Range Status   12/02/2022 144 135 - 148 mmol/L Final     Potassium Level   Date Value Ref Range Status   12/02/2022 3.4 (L) 3.5 - 5.5 mmol/L Final     Chloride   Date Value Ref Range Status   12/02/2022 108 100 - 110 mmol/L Final     Carbon Dioxide/CO2   Date Value Ref Range Status   12/02/2022 28 22 - 32 mmol/L Final     Calcium Level Total   Date Value Ref Range Status   12/02/2022 9.7 8.4 - 10.2 mg/dL Final     Protein Total   Date Value Ref Range Status   12/02/2022 7.2 6.2 - 8.3 gm/dL Final     Albumin Level   Date Value Ref Range Status   12/02/2022 4.5 3.4 - 4.8 gm/dL Final     Cancer Antigen 125   Date Value Ref Range Status   12/02/2022 1,564.3 (H) 0.0 - 35.0 U/mL Final     Comment:     The testing method for CA 125 is a chemiluminescent immunoassay manufactured by [employer]. and  performed on the [employer] system. Values obtained with different assay methods or kits may be different and cannot be used interchangeably.Test results cannot be interpreted as absolute evidence for the presence or absence of malignant disease.       Electronically signed by Doreatha Lew, MD at 12/07/2022  7:30 PM EST

## 2022-12-03 NOTE — Telephone Encounter (Signed)
Pt called back and was made aware of Dr. Gentry Fitz recommendation.  She will follow up with oncology.  She also let me know that she just found out today that she needs an appointment with GI also and was waiting for a call to get that scheduled.  She was told today they found a mass on her colon.  I let her I would let Dr. Talbert Nan know what she found out today and she will have them fax [place] records also when she is seen.

## 2022-12-03 NOTE — Progress Notes (Signed)
Formatting of this note might be different from the original.  INFUSION CLINICAL MANAGEMENT:    I provided infusion clinical management.  Doreatha Lew, MD  Electronically signed by Doreatha Lew, MD at 12/06/2022  7:53 AM EST

## 2022-12-03 NOTE — Progress Notes (Signed)
Formatting of this note might be different from the original.  Pt ambulated into tx room alone. She is here for C5 Mirvetuximab soravtansine (Elahere) infusion for Ovarian cancer per Dr. Cristina Gong. Pt identifiers confirmed. Consent noted. Pt reports no complaints of N/V/D but does report constipation. Encouraged increased fiber intake. Reports fluid intake WNL See flowsheet for full assessment. Labs drawn prior to arrival for tx and WNL. Initiating care per MD order.    Tx completed, Incompatible w/ NS, flushed w/ D5W 38m.   Clinic Discharge Note  Documented problems discussed with the physician, Patient monitored closely and remained stable during treatment, Discharge instructions given to patient. All questions were answered., Discharge instructions were reinforced with patient. All questions were answered., Patient verbalized understanding of discharge instructions, Patient instructed to call office with any questions or concerns, Return appointment scheduled and Discharged from clinic  Patient status: alert, oriented, stable and without complaints  Accompanied: self  Teaching: S/S of infection, Hydration, Mouth sores, Unrelieved nausea, vomiting or diarrhea, Phlebitis, Fever/Chills, Specific drug effects, Rash, Neutropenic precautions and Thrombocytopenic precautions  Discharge status: ambulatory      Electronically signed by TMertie Moores RN at 12/03/2022  2:38 PM EST

## 2022-12-07 NOTE — Progress Notes (Signed)
Formatting of this note is different from the original.  [facility]?NOTIFICATION?12/07/2022 20:36?[name], Sue?MRN: [id number]    Criteria Met      5+ ED Visits in 12 months    Security and Safety  No Security Events were found.  ED Care Guidelines  There are currently no ED Care Guidelines for this patient. Please check your facility's medical records system.    Prescription Monitoring Program  Narx Score not available at this time.    E.D. Visit Count (12 mo.)  Facility Visits   [facility] 2   Total 5   Note: Visits indicate total known visits.     Recent Emergency Department Visit Summary  Date Facility [facility] Type Diagnoses or Chief Complaint    Dec 07, 2022  [place].  [place]  Emergency     Sep 30, 2022  [place].  [place].  [place]  Emergency      1. Diarrhea, unspecified      2. Nausea      3. Generalized abdominal pain      4. Chills (without fever)      5. Other malaise      6. Other fatigue      7. Malignant neoplasm of unspecified ovary      8. Essential (primary) hypertension      9. Peripheral vascular disease, unspecified      10. Personal history of other malignant neoplasm of large intestine     Sep 03, 2022  [place].  [place]  Emergency      Generalized abdominal pain     Aug 23, 2022  [facility]  [place].  [place]  Emergency      1. Pain in left hip      2. Weakness      2. Malignant neoplasm of unspecified ovary      3. Unspecified abdominal pain      weakness     Aug 21, 2022  [place].  [place]  Emergency      Unspecified abdominal pain      Malignant neoplasm of unspecified ovary       Recent Inpatient Visit Summary  No Recent Inpatient Visits were found.  Care Team  Provider Specialty Phone Fax Service Dates   Vivianne Master, MD Family Medicine [phone]  Current      [facility]  This patient has registered at the [facility] Emergency  Department  For more information visit: [email or web address]     PLEASE NOTE:     1.   Any care recommendations and other clinical information are provided as guidelines or for historical purposes only, and providers should exercise their own clinical judgment when providing care.    2.   You may only use this information for purposes of treatment, payment or health care operations activities, and subject to the limitations of applicable [facility] Policies.    3.   You should consult directly with the organization that provided a care guideline or other clinical history with any questions about additional information or accuracy or completeness of information provided.    ? 3086 [facility] - www.[email or web address]     Electronically signed by Provider Not In System at 12/07/2022  8:42 PM EST

## 2022-12-07 NOTE — ED Notes (Signed)
Formatting of this note might be different from the original.  Medical screening note    62 year old female presenting with dark stools, abdominal pain, nausea, diarrhea patient had chemotherapy for ovarian CA 3 days ago.  She was also informed that they are concerned about a recurrence of her colon CA per a CT done on 12/23.  States she had some shortness of breath and chest pain earlier in the day but none currently.  Feel patient more appropriate for horizontal area given active CA. appropriate labs have been ordered.  Ordered pain medication.  Will discuss with ED attending    Ardine Bjork, [place]  12/07/22 2055    Electronically signed by Ardine Bjork, PA at 12/07/2022  8:55 PM EST

## 2022-12-07 NOTE — ED Provider Notes (Signed)
Formatting of this note might be different from the original.    Attestation:    I have fully participated in the care of this patient.  I have reviewed all pertinent clinical information.  I agree with the management and disposition of this patient.  This service has been performed in part by a resident under my direction, as the teaching physician. I fully participated, in the care of this patient, reviewed the clinical information and agree with the management and disposition of this patient.  Comments::   Patient care signed out from Dr. Glennon Mac.  62 year old female with history of colon cancer presenting due to GI bleed.  States it has been ongoing for the past 4 days.  Also reporting diarrhea and shortness of breath.  Vital signs stable.   Patient also reporting diffuse abdominal pain.  Fecal occult positive with dark tarry stool.  Lab work grossly WNL   Including hemoglobin of 12.8.    CT abdomen/pelvis concerning for possible stercoral colitis.  Patient also still reporting significant abdominal pain after multiple IV pain medications.  As such, plan to admit patient for further workup and treatment      Damita Dunnings, DO  12/08/22 1610    Electronically signed by Damita Dunnings, DO at 12/08/2022  5:19 AM EST

## 2022-12-07 NOTE — Progress Notes (Signed)
Formatting of this note is different from the original.  [facility]?NOTIFICATION?12/07/2022 19:28?[name], Sue?MRN: [id number]    Criteria Met      5+ ED Visits in 12 months    Security and Safety  No Security Events were found.  ED Care Guidelines  There are currently no ED Care Guidelines for this patient. Please check your facility's medical records system.    Prescription Monitoring Program  Narx Score not available at this time.    E.D. Visit Count (12 mo.)  Facility Visits   [facility] 2   Total 5   Note: Visits indicate total known visits.     Recent Emergency Department Visit Summary  Date Facility [facility] Type Diagnoses or Chief Complaint    Dec 07, 2022  [place].  [place]  Emergency     Sep 30, 2022  [place].  [place].  [place]  Emergency      1. Diarrhea, unspecified      2. Nausea      3. Generalized abdominal pain      4. Chills (without fever)      5. Other malaise      6. Other fatigue      7. Malignant neoplasm of unspecified ovary      8. Essential (primary) hypertension      9. Peripheral vascular disease, unspecified      10. Personal history of other malignant neoplasm of large intestine     Sep 03, 2022  [place].  [place]  Emergency      Generalized abdominal pain     Aug 23, 2022  [facility]  [place].  [place]  Emergency      1. Pain in left hip      2. Weakness      2. Malignant neoplasm of unspecified ovary      3. Unspecified abdominal pain      weakness     Aug 21, 2022  [place].  [place]  Emergency      Unspecified abdominal pain      Malignant neoplasm of unspecified ovary       Recent Inpatient Visit Summary  No Recent Inpatient Visits were found.  Care Team  Provider Specialty Phone Fax Service Dates   Vivianne Master, MD Family Medicine [phone]  Current      [facility]  This patient has registered at the [facility] Emergency  Department  For more information visit: [email or web address]     PLEASE NOTE:     1.   Any care recommendations and other clinical information are provided as guidelines or for historical purposes only, and providers should exercise their own clinical judgment when providing care.    2.   You may only use this information for purposes of treatment, payment or health care operations activities, and subject to the limitations of applicable [facility] Policies.    3.   You should consult directly with the organization that provided a care guideline or other clinical history with any questions about additional information or accuracy or completeness of information provided.    ? 8295 [facility] - www.[email or web address]     Electronically signed by Provider Not In System at 12/07/2022  7:35 PM EST

## 2022-12-07 NOTE — ED Provider Notes (Signed)
Formatting of this note is different from the original.  HPI     Chief Complaint   Patient presents with    Rectal Bleeding    Shortness of Breath       62 year old female with medical history relevant for history of colon cancer, history of ovarian cancer receiving active chemotherapy, hypertension, and peripheral vascular disease, rest per chart, presenting via personal vehicle accompanied by her son Broadus John) and husband Juanda Crumble) secondary to persistent dark, tarry stools with concern for GI bleeding in addition to persistent left lower quadrant abdominal pain.  History provided by the patient as well as family at bedside.  Reportedly her last chemotherapy infusion was on January 10th, and the day after on January 11th, she endorsed 5-6 episodes of daily, loose, black, and dark/tarry stools, last episode occurring approximately 4:00 p.m. prior to arrival.  She endorses a history of GI bleeding prior, and also receiving blood transfusions.  She also endorses left lower quadrant abdominal pain, currently rated 12/10 and described as aching / throbbing.  She is followed by Dr. Cristina Gong of Hematology/ Oncology, and she was aware of concern for recurrent rectal mass on recent imaging obtained in December.  She also endorses decreased appetite with intermittent shortness of breath.  Endorses 3 episodes of nausea / vomiting since onset of dark, tarry stools, last occurring yesterday, reportedly nonbloody.   Denies any chest pain or headache. Endorses intermittently smoking black and milds but denies any alcohol use, NSAID use, or use of blood thinning medications including aspirin/Plavix.  She endorses generalized weakness as well as chills and feeling fatigued. She endorses FULL CODE status.     Patient History     Past Medical History:   Diagnosis Date    Colon cancer (CMS/HCC)     Hepatitis C infection     treated 2015    Hypertension     Ovarian cancer (CMS/HCC)     PAD (peripheral artery disease) (CMS/HCC)      Past  Surgical History:   Procedure Laterality Date    COLON SURGERY      OVARY SURGERY       No family history on file.    Social History     Socioeconomic History    Marital status: Married     Spouse name: Not on file    Number of children: Not on file    Years of education: Not on file    Highest education level: Not on file   Occupational History    Not on file   Tobacco Use    Smoking status: Never    Smokeless tobacco: Never   Vaping Use    Vaping Use: Never used   Substance and Sexual Activity    Alcohol use: Not Currently    Drug use: Yes     Comment: marijuana occasionally    Sexual activity: Defer   Other Topics Concern    Not on file   Social History Narrative    Drives a car     Social Determinants of Health     Financial Resource Strain: Not on file   Food Insecurity: Not on file   Transportation Needs: Not on file   Physical Activity: Not on file   Stress: Not on file   Social Connections: Not on file   Intimate Partner Violence: Unknown (12/07/2022)    Humiliation, Afraid, Rape, and Kick questionnaire     Fear of Current or Ex-Partner: Not on file  Emotionally Abused: Not on file     Physically Abused: No     Sexually Abused: Not on file   Housing Stability: Not on file       Review of Systems     Review of Systems   Constitutional:  Positive for chills and fatigue. Negative for fever.   Respiratory:  Positive for shortness of breath.    Cardiovascular:  Negative for chest pain.   Gastrointestinal:  Positive for abdominal pain, blood in stool, diarrhea, nausea and vomiting.   Neurological:  Positive for weakness (generalized). Negative for dizziness and headaches.   Hematological:  Does not bruise/bleed easily.   Psychiatric/Behavioral:  Negative for agitation, behavioral problems and confusion.       Physical Exam     ED Triage Vitals [12/07/22 1934]   Temp Heart Rate Resp BP SpO2   98.2 F (36.8 C) 88 20 133/84 97 %     Temp Source Heart Rate Source Patient Position BP Location FiO2 (%)   Oral Monitor  -- Right arm --     Physical Exam  Vitals and nursing note reviewed. Exam conducted with a chaperone present (Ms. Florentina Jenny, RN for D[facility].).   Constitutional:       General: She is in acute distress (2/2 abdominal pain symptoms).      Appearance: She is ill-appearing (chronically). She is not toxic-appearing.      Comments:   Patient appears cachectic, wearing a [employer] long-sleeve shirt and dark blue jeans. Husband and son present at bedside.   HENT:      Head: Normocephalic and atraumatic.      Right Ear: External ear normal. There is no impacted cerumen.      Left Ear: External ear normal.      Nose: Nose normal. No congestion or rhinorrhea.      Mouth/Throat:      Mouth: Mucous membranes are dry.      Pharynx: Oropharynx is clear. No oropharyngeal exudate or posterior oropharyngeal erythema.      Comments:   Airway is patent, uvula is midline, tolerating secretions.  Eyes:      Extraocular Movements: Extraocular movements intact.      Conjunctiva/sclera: Conjunctivae normal.      Pupils: Pupils are equal, round, and reactive to light.      Comments:   Subconjunctival pallor present bilaterally.   Cardiovascular:      Rate and Rhythm: Normal rate and regular rhythm.      Pulses:           Radial pulses are 2+ on the right side and 2+ on the left side.        Dorsalis pedis pulses are 2+ on the right side and 2+ on the left side.      Heart sounds: Normal heart sounds. No murmur heard.     Comments:   Port site present in right upper chest wall, site appearing clean/ dry and intact without surrounding redness, erythema, or drainage.  Pulmonary:      Effort: Pulmonary effort is normal. No respiratory distress.      Breath sounds: Normal breath sounds. No wheezing, rhonchi or rales.      Comments:   Maintaining oxygen saturation on room air.  Even and unlabored respirations with equal and symmetric chest rise.  No focal auscultation of consolidation.  Abdominal:      General: Abdomen is flat. Bowel sounds are  normal. There is no distension.  Palpations: Abdomen is soft.      Tenderness: There is abdominal tenderness. There is guarding. There is no rebound.      Comments:   Abdomen is non tympanic and not peritonitic.  There is a midline scar present which is well healed.  There is tenderness to palpation reproducible in the left lower quadrant with guarding.   Genitourinary:     Rectum: Quality control pass. and guaiac result positive.      Comments:   Female nurse chaperone present for rectal exam.  No palpable hemorrhoids or fissures/tears.  Normal rectal tone.  Dark, tarry stool present on exam glove which is guaiac positive.    Musculoskeletal:         General: Normal range of motion.      Cervical back: Normal range of motion and neck supple. No rigidity. No muscular tenderness.      Right lower leg: No edema.      Left lower leg: No edema.      Comments:   Moving all extremities spontaneously without focal strength deficits.  No unilateral calf swelling, erythema, tenderness, or warmth. Skin:     General: Skin is warm and dry.      Capillary Refill: Capillary refill takes less than 2 seconds.   Neurological:      General: No focal deficit present.      Mental Status: She is alert and oriented to person, place, and time.      GCS: GCS eye subscore is 4. GCS verbal subscore is 5. GCS motor subscore is 6.      Cranial Nerves: No cranial nerve deficit.   Psychiatric:         Mood and Affect: Mood normal.         Behavior: Behavior normal.      ED Course & MDM     Labs Reviewed   LIPASE - Abnormal       Result Value    Lipase 5 (*)    CBC WITH AUTO DIFF - CELLAVISION & MANUAL GROUPED - Abnormal    White Blood Cell 7.5      Red Blood Cell 3.72 (*)     Hemoglobin 12.8      Hematocrit 37.6      Mean Cell Volume 101.2 (*)     Mean Cell Hemoglobin 34.4 (*)     Mean Cell Hemoglobin Concentration 34.0      Red Cell Distribution Width 15.8 (*)     Platelet Count 189      Mean Platelet Volume 7.1 (*)     Neutrophils  Automated Absolute Number 5.0      Lymphocytes Automated Absolute Number 1.7      Monocytes Automated Absolute Number 0.8      Eosinophils Automated Absolute Number 0.0 (*)     Basophils Automated Absolute Number 0.0      Neutrophils Automated Percentage 66.9      Lymphocytes Automated Percentage 22.4      Monocytes Automated Percentage 10.3      Eosinophils Automated Percentage 0.2      Basophils Automated Percentage 0.2     HSTROPONIN I - Abnormal    Troponin I, High Sensitivity  17 (*)    BASIC METABOLIC PANEL - Abnormal    Sodium Level 139      Potassium Level 4.2      Chloride 104      Carbon Dioxide/CO2 21 (*)     Anion Gap 14  Glucose Random 70      Calcium Level Total 9.6      Blood Urea Nitrogen 22      Creatinine 0.83      BUN/Creatinine ratio 26.5 (*)     eGlomerular Filtration Rate African American >60     MAGNESIUM - Abnormal    Magnesium Level 1.6 (*)    HSTROPONIN I - Abnormal    Troponin I, High Sensitivity  21 (*)    POCT CHEM 8 - Abnormal    Sodium Level 138      Potassium Level 4.1      Chloride 102      Carbon Dioxide/CO2 23      Anion Gap, POC 18 (*)     Glucose Random 77      Blood Urea Nitrogen 22      Creatinine 0.80      Hemoglobin, POC 13.3      Hematocrit, POC 39.0      Calcium Level Ionized 1.13 (*)    POCT OCCULT BLOOD STOOL - Abnormal    FECAL OCCULT BLOOD INTERNAL QC Pass      Fecal Occult Blood Positive (*)    HEPATIC FUNCTION PANEL - Normal    Bilirubin Total 0.6      Alkaline Phosphatase 59      Aspartate Aminotransferase 22      Alanine Aminotransferase 7      Protein Total 7.3      Albumin Level 4.1      Bilirubin Direct 0.2     B-TYPE NATRIURETIC PEPTIDE - Normal    B-TYPE Natriuretic peptide 96.0     POCT LACTIC ACID - Normal    Lactic Acid Venous 0.6     CBC AND DIFFERENTIAL   URINALYSIS   URINALYSIS   TYPE AND SCREEN    ABO Rh Interpretation (Gel) B POS      Antibody Screen Interpretation (Gel) NEG     POCT CHEM 8   POCT LACTIC ACID     CT Abdomen Pelvis W Contrast    Preliminary Result by Interface, Incoming Imaging Results [facility] (12/08/22 0256)     Patient: [name]  Time Out: 02:56   Exam(s): CT ABDOMEN + PELVIS With Contrast     CT ABDOMEN + PELVIS With Contrast     Gas-filled dilatation of the large bowel noted with 2 zones of transition.   The first is present at the distal transverse colon where the large   bowel becomes focally collapsed.  After this focus the large bowel is   again moderately dilated and gas-filled to the level of the rectosigmoid.   Rectal and rectosigmoid wall thickening are present compatible with a   nonspecific proctosigmoiditis.  However further GI evaluation is   recommended to exclude an associated mucosal lesion.  There is a low-   attenuation edema involving a portion of the rectosigmoid and there is   soft tissue attenuating thickening at the left aspect of the rectosigmoid.     In addition to the rectosigmoid wall thickening there is diffuse mild   colonic wall thickening associated with the distention which may reflect   a stercoral colitis   The solid organs are unremarkable   The gallbladder is normal   The left common iliac artery is occluded with a patent femorofemoral   crossover bypass graft   IMPRESSION:   Electronically signed by Lesly Rubenstein, MD on 12-08-22 at 0256     XR Chest  1 view PA or AP   Final Result by Oliver Barre, MD (12/07/22 2109)   EXAM DESCRIPTION:   XR CHEST 1 VW     ACCESSION:   [id number]     COMPLETED DATE/TIME:   12/07/2022 9:05 pm     REASON FOR EXAM:   Shortness of breath     REPORT:   Compared with 11/21/2022.     Heart size is normal.  Subcutaneous port catheter on right its tip over    the superior vena cava.  No consolidations or effusions.  Rounded opacity    over the right lower hemithorax compatible with nipple shadow.     IMPRESSION:   No radiologic evidence of acute cardiopulmonary process demonstrated.         INTERPRETING PHYSICIAN: Raelyn Mora, MD   TAP//Report ID: [id number]      Creation Date: 12/07/22 9:06 pm       Incomplete F Codes       No Incomplete F-Codes Found         Completed F Codes       No Incomplete F-Codes Found         Medical Decision Making    62 year old female with medical history per HPI in chart, presenting via personal vehicle for evaluation of dark, tarry stools with concern for GI bleeding as well as left lower quadrant abdominal pain which is worsening compared to prior.  Vital signs stable on arrival.  Physical exam findings as above.  Differential diagnoses of progression of malignancy versus intra-abdominal perforation versus acute upper GI bleeding versus symptomatic anemia versus other.  Patient was initially seen by ED APP for MSE in the vertical zone,  and additional orders were placed by myself after evaluation at bedside.  Patient was guaiac positive, and received 40 mg IV Protonix.  Labs obtained and reviewed as below, without gross abnormalities noted.  Pain control improved on reassessment at bedside at time of sign-out.  Patient was signed out to the ED night team in stable condition with further medical management, diagnostics, consultation involvement and treatment as clinically indicated and at receiving team's discretion.  Disposition pending reassessment at bedside after completion of workup.    ED Course as of 12/08/22 0448   Sun Dec 07, 2022   2044 Patient states reaction to morphine is itch and nausea.  Will premedicate but would like to control her pain issue [JN]   2057 Received sign-out from Westley Foots, PA,   Regarding presentation for GI bleeding. [MS]   2106 Spoke with Dr. Glennon Mac and Dr. Renne Musca about patient being brought back to horizontal area.  Discussed patient presentation and complaints [JN]   2110 CT imaging of thorax/abdomen/pelvis w/ contrast on 11/21/22 reviewed.    IMPRESSION:  1.  Persistent rectosigmoid colon wall thickening concerning for colonic neoplasm.  2.  Enlarged left periaortic lymph node and stable left  inguinal lymphadenopathy.  3.  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  4.  Stable bilateral upper lobe pulmonary nodules..  The pulmonary nodules are indeterminate.   [MS]   2148 Chaperoned rectal exam with Ms. Florentina Jenny, RN. Fecal occult positive. Dark, tarry stool. [MS]   2207 Magnesium Level(!): 1.6  Will replenish with 2 g IV. [MS]   2207 CBC and differential(!)    Reviewed, no leukocytosis, WBC of 7.5 with H&H of 12.8/37.6 stable compared to prior.  Macrocytic MCV with value of 101.2.  platelet count of  189. [MS]   2207 Hepatic Function Panel    Reviewed, no transaminitis. [MS]   1027 Basic Metabolic Panel(!)    Reviewed, no gross electrolyte abnormalities, no renal insufficiency.  Glucose of 70. [MS]   2230 Care endorsed to Dr. Ileana Roup, PGY-2, and Dr. Luster Landsberg, ED attending,  pending lab workup, and CT imaging of the abdomen /pelvis with contrast  as well as pain control and reassessment at bedside for disposition. [MS]   Mon Dec 08, 2022   0051 Troponin I, High Sensitivity (!): 17 [KK]   0309 CT Abdomen Pelvis W Contrast  In addition to the rectosigmoid wall thickening there is diffuse mild  colonic wall thickening associated with the distention which may reflect  a stercoral colitis   [KK]   0310 Paged for admission [KK]     ED Course User Index  [JN] Ardine Bjork, [place] Caralee Ates, DO  [MS] Ria Comment, MD             ED Critical Care Provider Statement    Critical Care Provider Statement:           Clinical Impression       Diagnosis Comment Added By Time Added    Acute GI bleeding [K92.2]  Ria Comment, MD 12/07/2022 10:15 PM    Left lower quadrant abdominal pain [R10.32]  Ria Comment, MD 12/07/2022 10:16 PM    History of rectal cancer [Z85.048]  Ria Comment, MD 12/07/2022 10:16 PM    Hypomagnesemia [E83.42]  Ria Comment, MD 12/07/2022 10:16 PM         ED Disposition       ED Disposition   Admit    User   Caralee Ates,  DO    Date/Time   Mon Dec 08, 2022  3:12 AM    Comment   --             Ria Comment, MD  Resident  12/08/22 2536      Ria Comment, MD  Resident  12/08/22 [phone]    Electronically signed by Garey Ham, DO at 12/11/2022  6:04 AM EST

## 2022-12-07 NOTE — ED Triage Notes (Signed)
Formatting of this note might be different from the original.  Pt reports GI bleed after chemo on Wednesday. Reports diarrhea and black stool. Not on blood thinners. Reports SOB. PMH ovarian cancer and mass on liver.   Electronically signed by Brayton Mars, RN at 12/07/2022  7:30 PM EST

## 2022-12-07 NOTE — ED Provider Notes (Signed)
Formatting of this note is different from the original.  ED Physician Progress Note - Transfer of Care     Medical Decision Making: Care of patient assumed from Dr. Renne Musca and Glennon Mac at 2200 hours.  Agree with history, physical exam and plan.  See their note for further details.  Briefly, Sue [name] is a 62 y.o. female with PMHx significant for prior colo rectal cancer, hypertension and ovarian cancer who presented to the [facility] ED with 5-6 episodes of dark melanotic stool x 4 days.    Pertinent workup & findings prior to assuming care:   H&H stable  No hemorrhoids on DRE, fecal occult positive    Plan at time of handoff is:   Pain control and labs, CT pending, suspect admission    Additional ED Course:   The patient was evaluated following shift handoff and their exam is consistent with that reported by the previous provider.    ED Course as of 12/08/22 0846   Sun Dec 07, 2022   2044 Patient states reaction to morphine is itch and nausea.  Will premedicate but would like to control her pain issue [JN]   2057 Received sign-out from Westley Foots, PA,   Regarding presentation for GI bleeding. [MS]   2106 Spoke with Dr. Glennon Mac and Dr. Renne Musca about patient being brought back to horizontal area.  Discussed patient presentation and complaints [JN]   2110 CT imaging of thorax/abdomen/pelvis w/ contrast on 11/21/22 reviewed.    IMPRESSION:  1.  Persistent rectosigmoid colon wall thickening concerning for colonic neoplasm.  2.  Enlarged left periaortic lymph node and stable left inguinal lymphadenopathy.  3.  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  4.  Stable bilateral upper lobe pulmonary nodules..  The pulmonary nodules are indeterminate.   [MS]   2148 Chaperoned rectal exam with Ms. Florentina Jenny, RN. Fecal occult positive. Dark, tarry stool. [MS]   2207 Magnesium Level(!): 1.6  Will replenish with 2 g IV. [MS]   2207 CBC and differential(!)    Reviewed, no leukocytosis, WBC  of 7.5 with H&H of 12.8/37.6 stable compared to prior.  Macrocytic MCV with value of 101.2.  platelet count of 189. [MS]   2207 Hepatic Function Panel    Reviewed, no transaminitis. [MS]   1191 Basic Metabolic Panel(!)    Reviewed, no gross electrolyte abnormalities, no renal insufficiency.  Glucose of 70. [MS]   2230 Care endorsed to Dr. Ileana Roup, PGY-2, and Dr. Luster Landsberg, ED attending,  pending lab workup, and CT imaging of the abdomen /pelvis with contrast  as well as pain control and reassessment at bedside for disposition. [MS]   Mon Dec 08, 2022   0051 Troponin I, High Sensitivity (!): 17 [KK]   0309 CT Abdomen Pelvis W Contrast  In addition to the rectosigmoid wall thickening there is diffuse mild  colonic wall thickening associated with the distention which may reflect  a stercoral colitis   [KK]   0310 Paged for admission [KK]     ED Course User Index  [JN] Ardine Bjork, [place] Caralee Ates, DO  [MS] Ria Comment, MD     Patient was continuously monitored and reassessed by ED staff during the remainder of their time in the ED, as they awaited imaging results. Patient continued to have pain in the left lower quadrant, and medication required to keep her comfortable during ED stay. At this time patient is appropriate for admission for further workup  of rectosigmoid thickening and GI bleed in setting of possible worsening malignancy. Patient admitted to Dr. Justus Memory.     CLINICAL IMPRESSION:  1. Acute GI bleeding    2. Left lower quadrant abdominal pain    3. History of rectal cancer    4. Hypomagnesemia      DISPOSITION:  ED Disposition       ED Disposition   Admit    User   Caralee Ates, DO    Date/Time   Mon Dec 08, 2022  3:12 AM    Comment   --           Caralee Ates, DO  PGY-2  [facility]      Caralee Ates, DO  Resident  12/08/22 [phone]    Electronically signed by Damita Dunnings, DO at 12/11/2022  1:32 PM EST

## 2022-12-07 NOTE — ED Provider Notes (Signed)
Formatting of this note might be different from the original.    Attestation:    This service has been performed in part by a resident under my direction, as the teaching physician. I fully participated, in the care of this patient, reviewed the clinical information and agree with the management and disposition of this patient.    Medical Decision Making  10:46 PM Patient's pain is improved after analgesics. Pending CT imaging and replacement of magnesium for disposition. Patient signed out to Dr. Luster Landsberg. Patient and family aware of plan and need for additional imaging before discussion of disposition.     Problems Addressed:  Acute GI bleeding: acute illness or injury  History of rectal cancer: chronic illness or injury  Hypomagnesemia: acute illness or injury  Left lower quadrant abdominal pain: acute illness or injury    Amount and/or Complexity of Data Reviewed  Independent Historian: caregiver  External Data Reviewed: notes.  Labs: ordered.  Radiology: ordered.  ECG/medicine tests: ordered.        Garey Ham, DO  12/07/22 2251    Electronically signed by Garey Ham, DO at 12/07/2022 10:51 PM EST

## 2022-12-08 NOTE — ED Notes (Signed)
Formatting of this note might be different from the original.  Pt is A&O ambulated to restroom with assistance, tolerated well. Pt requested pain meds.     Jonelle Sports, RN  12/08/22 [phone]    Electronically signed by Jonelle Sports, RN at 12/08/2022  7:27 AM EST

## 2022-12-08 NOTE — ED Notes (Signed)
Formatting of this note might be different from the original.  Pt sleeping, breathing even and unlabored, on monitor.,     Jonelle Sports, RN  12/08/22 1206    Electronically signed by Jonelle Sports, RN at 12/08/2022 12:06 PM EST

## 2022-12-08 NOTE — H&P (Signed)
Formatting of this note is different from the original.  General H & P    Chief Complaint: Blood per rectum      HPI:  Sue [name] is a 62 y.o.female with h/o colon and ovarian cancer, on active chemotherapy, HTN, PVD, Hep C (treated), presented to [facility] ED after passing dark , tarry stools and developing LLQ abdominal pain. Patient was further evaluated with CT scan study of abdomen and pelvis shows:  rectosigmoid wall thickening there is diffuse mild colonic wall thickening associated with the distention which may reflect a stercoral colitis, however could not r/o malignacy. Further gi w/u recommended. Patient had guaiac positive dark stool by digital exam. H/H stable at 12.8/37.6. Lactic acid was 0.6 Mag 1.6 and hs Trop I was 17 then 21. Patient was referred to the Hospitalists team for further inpatient care.     Review of Systems   Constitutional:  Positive for activity change. Negative for chills, diaphoresis and fever.   HENT: Negative.  Negative for congestion, facial swelling and trouble swallowing.    Eyes: Negative.  Negative for visual disturbance.   Respiratory:  Positive for shortness of breath. Negative for cough.         Intermittent SOB   Cardiovascular: Negative.  Negative for chest pain and palpitations.   Gastrointestinal:  Positive for abdominal pain, blood in stool, nausea and vomiting. Negative for diarrhea.        LLQ abdominal pain   Endocrine: Negative.    Genitourinary: Negative.  Negative for hematuria.   Musculoskeletal: Negative.    Skin: Negative.  Negative for pallor.   Allergic/Immunologic: Negative.    Neurological: Negative.    Hematological: Negative.    Psychiatric/Behavioral: Negative.         Allergies:  Other, Penicillins, Hydrocodone, Demerol hcl [meperidine], Ibuprofen, Morphine, Oxycodone, Strawberry, and Tylenol [acetaminophen]    Prior to Admission medications    Medication Sig Start Date End Date Taking? Authorizing Provider   amLODIPine (NORVASC) 10 MG tablet Take 10  mg by mouth daily   Yes Historical Provider, MD   dexamethasone (DECADRON) 4 MG tablet Take 8 mg daily x 2 days after each chemo day 08/21/22  Yes Doreatha Lew, MD   lidocaine (LIDODERM) 5 % Apply 1 patch topically daily Remove & discard patch within 12 hours or as directed by MD.   Yes Historical Provider, MD   lidocaine-prilocaine (EMLA) cream Apply topically as needed for mild pain (PSR 1-3)   Yes Historical Provider, MD   lidocaine-prilocaine (EMLA) cream Apply cream generously over port site and cover with plastic, at least 1 hour prior to each treatment appointment 08/21/22  Yes Doreatha Lew, MD   losartan (COZAAR) 50 MG tablet Take 50 mg by mouth daily   Yes Historical Provider, MD   Lyrica 200 MG capsule Take 1 capsule (200 mg total) by mouth 2 (two) times a day 08/30/22 02/26/23 Yes Dionne Bucy Deridder, MD   Magnesium ([employer] TRIPLE MAGNESIUM COMPLEX) 400 MG capsule Take 400 mg by mouth 2 (two) times a day 11/14/22  Yes Zeb Comfort, NP   methadone (DOLOPHINE) 10 MG tablet  06/06/22  Yes Historical Provider, MD   ondansetron (ZOFRAN) 8 MG tablet Take 8 mg by mouth every 8 (eight) hours as needed for nausea or vomiting As needed   Yes Historical Provider, MD   ondansetron (ZOFRAN) 8 MG tablet Take 1 tablet (8 mg total) by mouth every 8 (eight) hours as needed for nausea or vomiting  Indications: Nausea and Vomiting caused by Cancer Chemotherapy Start second day after chemo 08/21/22  Yes Doreatha Lew, MD   Polyethyl Glyc-Propyl Glyc PF (Lubricant Eye Drops, PF,) 0.4-0.3 % solution Administer 1 drop into both eyes 4 (four) times a day During treatment with mirvetuximab soravtansine instill 4 times daily and as needed for dry eyes. 08/21/22  Yes Doreatha Lew, MD   prednisoLONE acetate (PRED FORTE) 1 % ophthalmic suspension Administer 1 drop into both eyes 6 (six) times a day Starting the day prior to each infusion, use one drop in each eye 6 times a day until day 4; then use 1 drop in each eye 4 times a day on days 5-8 of each  cycle. 10/08/22  Yes Rigoberto Arteaga, OD   acetaminophen (TYLENOL) 325 MG tablet Take 325 mg by mouth every 6 (six) hours as needed for mild pain (PSR 1-3) As needed    Historical Provider, MD   docusate sodium (COLACE) 100 MG capsule Take 100 mg by mouth 2 (two) times a day As needed    Historical Provider, MD   HYDROcodone-acetaminophen (NORCO) 5-325 MG per tablet Take 1-2 tablets by mouth every 6 (six) hours as needed for moderate pain (PSR 4-6) 11/28/22   Zeb Comfort, NP   methylPREDNISolone 4 MG tablet therapy pack use as directed FOLLOW DIRECTIONS ON BACK OF FOIL PACK 08/25/22   Historical Provider, MD   pregabalin (LYRICA) 200 MG capsule Take 1 capsule (200 mg total) by mouth 2 (two) times a day 08/28/22 09/27/22  Doreatha Lew, MD   prochlorperazine (COMPAZINE) 10 MG tablet Take 10 mg by mouth every 6 (six) hours as needed for nausea or vomiting As needed    Historical Provider, MD   prochlorperazine (COMPAZINE) 10 MG tablet Take 1 tablet (10 mg total) by mouth every 6 (six) hours as needed (Nausea and Vomiting) 08/21/22   Doreatha Lew, MD     Past Medical History:   Diagnosis Date    Colon cancer (CMS/HCC)     Hepatitis C infection     treated 2015    Hypertension     Ovarian cancer (CMS/HCC)     PAD (peripheral artery disease) (CMS/HCC)      Past Surgical History:   Procedure Laterality Date    COLON SURGERY      OVARY SURGERY       Family History:  family history is not on file.  has no family status information on file.     Social History:   reports that she has never smoked. She has never used smokeless tobacco. She reports that she does not currently use alcohol. She reports current drug use.    Physical Exam  Vitals and nursing note reviewed.   Constitutional:       Appearance: She is ill-appearing.   HENT:      Head: Normocephalic and atraumatic.      Nose: Nose normal. No congestion or rhinorrhea.      Mouth/Throat:      Pharynx: No oropharyngeal exudate or posterior oropharyngeal erythema.   Eyes:       General: No scleral icterus.     Extraocular Movements: Extraocular movements intact.      Pupils: Pupils are equal, round, and reactive to light.   Cardiovascular:      Rate and Rhythm: Normal rate and regular rhythm.      Pulses: Normal pulses.      Heart sounds: Normal heart sounds. No murmur heard.  No friction rub. No gallop.   Pulmonary:      Effort: Pulmonary effort is normal.      Breath sounds: Rhonchi present. No wheezing or rales.   Chest:      Chest wall: Tenderness present.   Abdominal:      General: There is no distension.      Palpations: Abdomen is soft. There is no mass.      Tenderness: There is abdominal tenderness. There is no guarding or rebound.   Genitourinary:     Rectum: Guaiac result positive.   Musculoskeletal:         General: No swelling, tenderness, deformity or signs of injury. Normal range of motion.   Skin:     General: Skin is warm.      Capillary Refill: Capillary refill takes less than 2 seconds.      Coloration: Skin is not jaundiced or pale.      Findings: No bruising or erythema.   Neurological:      General: No focal deficit present.      Mental Status: She is alert and oriented to person, place, and time.      Cranial Nerves: No cranial nerve deficit.      Sensory: No sensory deficit.      Deep Tendon Reflexes: Reflexes normal.   Psychiatric:         Mood and Affect: Mood normal.         Behavior: Behavior normal.     Labs:  Lab Results       ID Description Status Collection Date/Time    [id number] CBC and differential (Abnormal) Final result 12/07/22 2058    [id number] Urinalysis  Needs to be Collected --    [id number] Hepatic Function Panel () Final result 12/07/22 2058      Result Value Flag Comment    Bilirubin Total 0.6      Alkaline Phosphatase 59      Aspartate Aminotransferase 22      Alanine Aminotransferase 7      Protein Total 7.3      Albumin Level 4.1      Bilirubin Direct 0.2            [id number] Lipase (Abnormal) Final result 12/07/22 2058      Result Value  Flag Comment    Lipase 5 Low           [id number] CBC and Differential (Abnormal) Final result 12/07/22 2058      Result Value Flag Comment    White Blood Cell 7.5      Red Blood Cell 3.72 Low     Hemoglobin 12.8      Hematocrit 37.6      Mean Cell Volume 101.2 High     Mean Cell Hemoglobin 34.4 High     Mean Cell Hemoglobin Concentration 34.0      Red Cell Distribution Width 15.8 High     Platelet Count 189      Mean Platelet Volume 7.1 Low     Neutrophils Automated Absolute Number 5.0      Lymphocytes Automated Absolute Number 1.7      Monocytes Automated Absolute Number 0.8      Eosinophils Automated Absolute Number 0.0 Low     Basophils Automated Absolute Number 0.0      Neutrophils Automated Percentage 66.9      Lymphocytes Automated Percentage 22.4      Monocytes Automated Percentage 10.3  Eosinophils Automated Percentage 0.2      Basophils Automated Percentage 0.2            [id number] Urinalysis  Needs to be Collected --    [id number] hsTroponin I (Abnormal) Final result 12/07/22 2058      Result Value Flag Comment    Troponin I, High Sensitivity  17 High           [id number] Basic Metabolic Panel (Abnormal) Final result 12/07/22 2058      Result Value Flag Comment    Sodium Level 139      Potassium Level 4.2      Chloride 104      Carbon Dioxide/CO2 21 Low     Anion Gap 14      Glucose Random 70      Calcium Level Total 9.6      Blood Urea Nitrogen 22      Creatinine 0.83      BUN/Creatinine ratio 26.5 High     eGlomerular Filtration Rate African American >60  Interpretive Data:  Estimated glomerular filtration rate to be used for African American patients based on the MDRD (Modification of Diet in Renal Disease) calculation. Please refer to pharmacy personnel for calculation of a modified Cockcroft-Gault estimated CrCl used in drug dosing protocols.          [id number] Magnesium, Level (Abnormal) Final result 12/07/22 2058      Result Value Flag Comment    Magnesium Level 1.6 Low           [id number] B-type  Natriuretic Peptide () Final result 12/08/22 0003      Result Value Flag Comment    B-TYPE Natriuretic peptide 96.0            [id number] hsTroponin I (Abnormal) Final result 12/08/22 0300      Result Value Flag Comment    Troponin I, High Sensitivity  21 High           [id number] POCT Chem 8 (Abnormal) Final result 12/07/22 2059      Result Value Flag Comment    Sodium Level 138      Potassium Level 4.1      Chloride 102      Carbon Dioxide/CO2 23      Anion Gap, POC 18 High     Glucose Random 77      Blood Urea Nitrogen 22      Creatinine 0.80      Hemoglobin, POC 13.3      Hematocrit, POC 39.0      Calcium Level Ionized 1.13 Low           [id number] Occult Blood, Fecal, POC (Abnormal) Final result 12/07/22 2152      Result Value Flag Comment    FECAL OCCULT BLOOD INTERNAL QC Pass      Fecal Occult Blood Positive Abnormal           [id number] POCT Lactic Acid () Final result 12/08/22 0009      Result Value Flag Comment    Lactic Acid Venous 0.6            [id number] Type and screen  Final result 12/07/22 2058      Result Value Flag Comment    ABO Rh Interpretation (Gel) B POS  '@01'$ /14/24 22:20 by Gar Ponto:  Automated Rh type confirmed by manual tube.    Antibody Screen Interpretation (Gel) NEG  No results found for: "COVID19"    Lab results have been reviewed by myself as of December 08, 2022    Pertinent Imaging Results:  Incomplete F Codes       No Incomplete F-Codes Found         Completed F Codes       No Incomplete F-Codes Found         All Radiology Impressions     X-ray Impressions  Results for orders placed during the hospital encounter of 12/07/22    XR CHEST 1 VW 12/07/2022 (Final)    Status: Normal    Impression  No radiologic evidence of acute cardiopulmonary process demonstrated.    INTERPRETING PHYSICIAN: Raelyn Mora, MD  TAP//Report ID: [id number]    Creation Date: 12/07/22 9:06 pm    CT Impressions  No results found for this visit on 12/07/22.    US Impressions  No results found for this  visit on 12/07/22.    MRI Impressions  No results found for this visit on 12/07/22.    EKG:  EKG Stress Test Results (Last 7 days)       ** No results found for the last 168 hours. **         EKG Results (last 7 days)       Procedure Component Value Units Date/Time    EKG 12 lead - ED PREVIOUSLY PERFORMED [[id number]] Resulted: 12/08/22 0721    Order Status: Completed Updated: 12/08/22 0722         EKG results have been reviewed by myself as of December 08, 2022    Other Results:      Vital Signs:   Last 24h Min/Max     Temp (!) 99.2 F (37.3 C)  Temp  Min: 98.2 F (36.8 C)  Max: 99.2 F (37.3 C)   HR 77  Pulse  Min: 72  Max: 88   RR (!) 12  Resp  Min: 12  Max: 20   BP 164/82  BP  Min: 133/84  Max: 164/82   SpO2 95 %  SpO2  Min: 95 %  Max: 97 %   $ Oxygen Therapy: None (Room air) (12/08/22 0600)    Wt Readings from Last 3 Encounters:   12/07/22 110 lb (49.9 kg)   12/03/22 109 lb 10.9 oz (49.7 kg)   12/03/22 109 lb 10.9 oz (49.7 kg)     Intake/Output:  No intake or output data in the 24 hours ending 12/08/22 0734    RHS DVT PROPHYLAXIS ORDERS (From admission, onward)       Start     Ordered    Signed and Held  Pharmacological VT Prophylaxis Not Indicated/Contraindicated  Once         Signed and Held    Signed and Held  Place sequential compression device  Until SCDs Discontinued       Signed and Held               Assessment/Plan     Acute gi bleeding - patient with dark, tarry appearing stool guaiac positive by occult blood testing.  -- Admit to gmf/tele floor  -- NPO except meds and sips of water, until patient is evaluated per gi today  -- monitor patient on gmf/tele floor  -- H/H every 4 hours  -- IV Protonix    Colonic mass - likely stercoral colitis, however cannot rule out malignancy.   -- will consult gi for further w/u recommendations  --  NPO except meds until patient is seen per gi today.  -- bowel regimen to include enemas  -- empiric IV antibiotic therapy with Meropenem, pharmacy to dose    Colon and  ovarian cancer - on active chemotherapy followed per Dr. Cristina Gong  -- Will consult Dr. Cristina Gong for management of chemo therapy    Hypomagnesemia - Mag 1.6.  2 grams IV Mag given in ED   -- Will repeat Mag level in AM  -- tele monitoring    Principal Problem:    Acute GI bleeding    Sue [name] is a 62 y.o.female diagnosed with the acute principle problem listed above. she has the following co-morbidities: Colon and ovarian cancer . Therefore, she will need to be admitted to the general medical floor as an inpatient, for a minimum of 2 midnights to reduce the risk of developing acute metabolic, neurologic or physical injuries resulting in severe disabilities or even death if not treated in a hospital setting.     Face to Face Patient Counseling / Coordinating Care >50% of Encounter Time: [facility] F to F Enc Time (YES/NO): Yes  Total Encounter Time (in minutes): 55 min  Electronically signed by Chiquita Loth, MD at 12/08/2022  8:52 AM EST

## 2022-12-08 NOTE — Consults (Signed)
Associated Order(s): IP CONSULT TO GASTROENTEROLOGY  Formatting of this note is different from the original.  GASTROENTEROLOGY CONSULTATION    Asked to see patient by Bonney Leitz, MD for GI bleed     CC: black stools  HPI: Sue [name] is a 62 y.o. female with metastatic ovarian cancer currently on chemotherapy, history of colon cancer s/p right hemicolectomy, peripheral vascular disease, and hypertension presented to the emergency room yesterday with black stools starting on Thursday.  Patient had chemotherapy on Wednesday and then started having black stools on Thursday.  Patient states she is having 5-6 bowel movements per day.  Patient did take Pepto-Bismol on Thursday.  Rectal exam in the ER revealed dark, tarry stools.  Hemoglobin this morning is still pending but last night it was 12.8.  CT shows rectal wall thickening with proximal small and large bowel dilatation suggestive of distal colonic obstruction.  Patient states she does have left-sided abdominal pain and has been throwing up.  Patient denies any coffee-ground emesis or hematemesis.    Patient was diagnosed with ovarian cancer and colon cancer in 2015.  Patient had right hemicolectomy in her last colon was in [place] in 2015.  Patient had a CT imaging done in December 2023 with Oncology and there is concern for recurrent colon cancer.  Patient did have stage IV ovarian cancer and then went into remission until summer 2023.  There is lung involvement of cancer and patient started chemotherapy.      Anticoagulation / Anti plt use: none    Past Medical History:   Past Medical History:   Diagnosis Date    Colon cancer (CMS/HCC)     Hepatitis C infection     treated 2015    Hypertension     Ovarian cancer (CMS/HCC)     PAD (peripheral artery disease) (CMS/HCC)      Past Surgical History:  Past Surgical History:   Procedure Laterality Date    COLON SURGERY      OVARY SURGERY       Family History:  No family history on  file.    Medications:    Current Facility-Administered Medications:     amLODIPine (NORVASC) tablet 10 mg, 10 mg, Oral, Daily, Bernette Redbird III, MD, 10 mg at 12/08/22 7628    artificial tears ophthalmic solution 1 drop, 1 drop, Both Eyes, 4x daily, Bernette Redbird III, MD    docusate sodium (COLACE) capsule 100 mg, 100 mg, Oral, BID, Bernette Redbird III, MD, 100 mg at 12/08/22 3151    HYDROcodone-acetaminophen (NORCO) 5-325 MG per tablet 1 tablet, 1 tablet, Oral, Q4 Hrs PRN, Bernette Redbird III, MD, 1 tablet at 12/08/22 0822    lidocaine (LIDOCARE) 4 % 1 patch, 1 patch, Transdermal, Daily, Bernette Redbird III, MD, 1 patch at 12/08/22 7616    losartan (COZAAR) tablet 50 mg, 50 mg, Oral, Daily, Bernette Redbird III, MD, 50 mg at 12/08/22 0737    magnesium oxide tablet 400 mg, 400 mg, Oral, BID, Bernette Redbird III, MD, 400 mg at 12/08/22 0823    meropenem (MERREM) 500 mg in sterile water (PF) syringe, 500 mg, Intravenous, Q6 Hrs, Bernette Redbird III, MD    methadone (DOLOPHINE) 10 mg tablet **TIME CRITICAL MED**, 10 mg, Oral, Daily, Bernette Redbird III, MD    naloxone [facility]) injection 0.4 mg, 0.4 mg, Intravenous, PRN, Bernette Redbird III, MD    ondansetron [facility]) tablet 8 mg, 8 mg, Oral,  Q8 Hrs PRN, Bernette Redbird III, MD    pantoprazole (PROTONIX) injection 40 mg, 40 mg, Intravenous, Q12 Hrs, Bernette Redbird III, MD    polyethylene glycol (GLYCOLAX) packet 17 g, 17 g, Oral, Daily, Bernette Redbird III, MD    prednisoLONE acetate (PRED FORTE) 1 % ophthalmic suspension 1 drop, 1 drop, Both Eyes, 4x daily, Bernette Redbird III, MD    pregabalin (LYRICA) capsule 150 mg, 150 mg, Oral, BID, Bernette Redbird III, MD, 150 mg at 12/08/22 1610    prochlorperazine (COMPAZINE) injection 5 mg, 5 mg, Intravenous, Q4 Hrs PRN, Bernette Redbird III, MD    sodium chloride 0.9 % infusion, 42 mL/hr, Intravenous, Continuous, Bernette Redbird III, MD, Last Rate: 42 mL/hr at 12/08/22 0820, 42 mL/hr at  12/08/22 0820    tap water enema, 1 enema, Rectal, Once, Bernette Redbird III, MD    Current Outpatient Medications:     amLODIPine (NORVASC) 10 MG tablet, Take 10 mg by mouth daily, Disp: , Rfl:     dexamethasone (DECADRON) 4 MG tablet, Take 8 mg daily x 2 days after each chemo day, Disp: 60 tablet, Rfl: 2    lidocaine (LIDODERM) 5 %, Apply 1 patch topically daily Remove & discard patch within 12 hours or as directed by MD., Disp: , Rfl:     lidocaine-prilocaine (EMLA) cream, Apply topically as needed for mild pain (PSR 1-3), Disp: , Rfl:     lidocaine-prilocaine (EMLA) cream, Apply cream generously over port site and cover with plastic, at least 1 hour prior to each treatment appointment, Disp: 30 g, Rfl: 2    losartan (COZAAR) 50 MG tablet, Take 50 mg by mouth daily, Disp: , Rfl:     Lyrica 200 MG capsule, Take 1 capsule (200 mg total) by mouth 2 (two) times a day, Disp: 60 capsule, Rfl: 5    Magnesium ([employer] TRIPLE MAGNESIUM COMPLEX) 400 MG capsule, Take 400 mg by mouth 2 (two) times a day, Disp: 56 capsule, Rfl: 0    methadone (DOLOPHINE) 10 MG tablet, , Disp: , Rfl:     ondansetron (ZOFRAN) 8 MG tablet, Take 8 mg by mouth every 8 (eight) hours as needed for nausea or vomiting As needed, Disp: , Rfl:     ondansetron (ZOFRAN) 8 MG tablet, Take 1 tablet (8 mg total) by mouth every 8 (eight) hours as needed for nausea or vomiting Indications: Nausea and Vomiting caused by Cancer Chemotherapy Start second day after chemo, Disp: 30 tablet, Rfl: 2    Polyethyl Glyc-Propyl Glyc PF (Lubricant Eye Drops, PF,) 0.4-0.3 % solution, Administer 1 drop into both eyes 4 (four) times a day During treatment with mirvetuximab soravtansine instill 4 times daily and as needed for dry eyes., Disp: 10 mL, Rfl: 3    prednisoLONE acetate (PRED FORTE) 1 % ophthalmic suspension, Administer 1 drop into both eyes 6 (six) times a day Starting the day prior to each infusion, use one drop in each eye 6 times a day until day 4; then use 1 drop  in each eye 4 times a day on days 5-8 of each cycle., Disp: 15 mL, Rfl: 2    acetaminophen (TYLENOL) 325 MG tablet, Take 325 mg by mouth every 6 (six) hours as needed for mild pain (PSR 1-3) As needed, Disp: , Rfl:     docusate sodium (COLACE) 100 MG capsule, Take 100 mg by mouth 2 (two) times a day As needed, Disp: , Rfl:  HYDROcodone-acetaminophen (NORCO) 5-325 MG per tablet, Take 1-2 tablets by mouth every 6 (six) hours as needed for moderate pain (PSR 4-6), Disp: 60 tablet, Rfl: 0    methylPREDNISolone 4 MG tablet therapy pack, use as directed FOLLOW DIRECTIONS ON BACK OF FOIL PACK, Disp: , Rfl:     pregabalin (LYRICA) 200 MG capsule, Take 1 capsule (200 mg total) by mouth 2 (two) times a day, Disp: 60 capsule, Rfl: 0    prochlorperazine (COMPAZINE) 10 MG tablet, Take 10 mg by mouth every 6 (six) hours as needed for nausea or vomiting As needed, Disp: , Rfl:     prochlorperazine (COMPAZINE) 10 MG tablet, Take 1 tablet (10 mg total) by mouth every 6 (six) hours as needed (Nausea and Vomiting), Disp: 90 tablet, Rfl: 2    Allergies:  Allergies   Allergen Reactions    Other Hives and Itching     Highly acidic foods "oranges and tomatoes". Pt breaks out and itches    Penicillins Other (see comments)     Childhood allergy    Hydrocodone Hives    Demerol Hcl [Meperidine] GI intolerance     Nausea/Vomiting    Ibuprofen GI intolerance     Nausea and vomiting    Morphine GI intolerance     Nausea/Vomiting    Oxycodone Itching    Strawberry Itching     Fruit and strawberry flavoring    Tylenol [Acetaminophen] GI intolerance     Nausea and Vomiting     Iodine allergy:no     Social History     Socioeconomic History    Marital status: Married   Tobacco Use    Smoking status: Never    Smokeless tobacco: Never   Vaping Use    Vaping Use: Never used   Substance and Sexual Activity    Alcohol use: Not Currently    Drug use: Yes     Comment: marijuana occasionally    Sexual activity: Defer   Social History Narrative    Drives a  car     Social Determinants of Health     Intimate Partner Violence: Unknown (12/07/2022)    Humiliation, Afraid, Rape, and Kick questionnaire     Physically Abused: No     Review of Systems   Constitutional:  Negative for chills and fever.   HENT:  Negative for sore throat and trouble swallowing.    Respiratory:  Positive for shortness of breath.    Cardiovascular:  Negative for chest pain.   Gastrointestinal:  Positive for abdominal pain, blood in stool, diarrhea, nausea and vomiting.   Neurological:  Negative for dizziness.     PHYSICAL EXAM:  Vitals:    12/08/22 0800   BP: 146/64   Pulse: 76   Resp: (!) 14   Temp:    SpO2: 95%     Gen :  Frail, cachectic female  Lungs : Clear bilaterally. No wheezes or crackles  Heart : S1, S2 heard. Regular  Abd :  Hyperactive bowel sounds, mild distention, firm, tender on palpation of left side of abdomen  Ext : No edema.  Neuro : Alert, awake, oriented.  Psych :  Tearful at times  Skin : no rash or jaundice.    Labs:    Lab Results   Component Value Date    ALT 7 12/07/2022    AST 22 12/07/2022    ALKPHOS 59 12/07/2022    BILITOT 0.6 12/07/2022     Lab Results   Component  Value Date    WBC 7.5 12/07/2022    HGB 13.3 12/07/2022    HCT 39.0 12/07/2022    MCV 101.2 (H) 12/07/2022    PLT 189 12/07/2022     Assessment/Plan:  Black stools:   Patient did take Pepto-Bismol around the same time that the black stools appeared.  Hemoglobin was in the 12+ night.  Unclear if black stools are from Pepto-Bismol use or an upper GI bleed.    -monitor H&H and transfuse as needed.  -monitor stool output.    -continue PPI b.i.d..    -avoid NSAIDs.  -consider EGD tomorrow.    Rectal wall thickening with small and large bowel dilatation seen on CT:   Concerning for partial distal obstruction.  Patient is still passing stool.  There is concern for recurrence of colon cancer.      -recommend serial abdominal exams.    -continue NPO status.    -ordered KUB for the a.m..    -okay to continue bowel  regimen ordered by primary team.    -consider flex sigmoidoscopy with enema was tomorrow.    Thank you for involving our service in the care of Cam Rod.  Additional recommendations or any changes to the plan of care may follow per GI attending, Dr. West Pugh.  Please call with any questions.    This note was prepared using voice recognition software and may contain inadvertent typographical errors.     Electronically Signed By: Jack Quarto, PA 12/08/2022 9:20 AM   Electronically signed by Loel Dubonnet, MD at 12/08/2022 11:31 AM EST    Associated attestation - Loel Dubonnet, MD - 12/08/2022 11:31 AM EST  Formatting of this note might be different from the original.  GI - ATTENDING SUPERVISORY NOTE    PT. SEEN WITH PA Sherley Bounds. I HAVE PERSONALLY SEEN AND EXAMINED THE PT.   I CONCUR WITH HIS/HER PLAN AS OUTLINED IN THE CONSULT NOTE.     24 year lady metastatic ovarian cancer on chemo, colon cancer in the past right hemicolectomy, came to the ER with the complaints of diarrhea and black stools after chemo.  GI consulted for evaluation of concerning for GI bleed.  Patient seen and evaluated bedside.  Patient looks clinically dehydrated and cachectic.  States he had emesis couple of days ago not since then.  Does have loose stools with black colored but does history of Pepto-Bismol on the same duration.  Denies any NSAID use.  Latest hemoglobin 12.8 almost at baseline.  MCV 101  Abdominal examination large midline scar from previous surgery mildly tympanic otherwise soft and no guarding  Assessment and plan  Loose stools with the dark color?  Melena concern for GI bleed source given history of Pepto-Bismol not sure it is related to any upper GI source given the hemoglobin is stable at her baseline.  Suggest continue to monitor treat empirically with PPI and avoid NSAID use if any.    -abnormal CT imaging rectal wall thickening concern for colorectal cancer recurrence versus metastasis.  Further  evaluation with the flexible sigmoidoscopy/colonoscopy once the patient is optimized tentatively tomorrow.    -if any significant drop in hemoglobin and continue melena then may consider upper GI endoscopy.  -please monitor closely for any signs of bowel obstruction given CT imaging showed dilated loops and mildly distended stomach.  Given MCV is likely related to chemo we will double check for any B12 or folate deficiency,

## 2022-12-08 NOTE — Care Plan (Signed)
Formatting of this note might be different from the original.  Patient admitted earlier today by my colleague for blood in stool  On physical examination this morning patient without complaint  Evaluated by GI, plan for flex sigmoidoscopy  Continue to trend CBC  Electronically signed by Bonney Leitz, MD at 12/08/2022  5:42 PM EST

## 2022-12-08 NOTE — Consults (Signed)
Associated Order(s): Inpatient consult to Hematology Oncology  Formatting of this note is different from the original.  Images from the original note were not included.  Oncology Consult  Inpatient consult to Hematology Oncology  Consult performed by: Doreatha Lew, MD  Consult ordered by: Chiquita Loth, MD    Chief Complaint   Patient presents with    Rectal Bleeding    Shortness of Breath     Referring Provider: Kathlene November, MD    Subjective   - Seen and examined this morning and accompanied by her husband.  - Reports symptoms began on 12/04/22. Suddenly developed recurrent loose melena stools several times a day.  - Reports associated significant abdominal pain.  - She later developed recurrent nausea and vomiting and was unable to tolerate PO intake.  - Reports growling of the stomach.  - This morning, she endorses mild to moderate improvement.    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     Following the C5 chemotherapy session on 12/03/22, the patient began experiencing recurrent melena stool, accompanied by abdominal pain. Patient endorses intractable nausea and vomiting.     - CBC revealed stable hemoglobin levels  -  Renal function remained intact.   - FOBT was positive.   - A CT AP 12/17/22 revealed known rectal wall thickening with proximal small and large bowel dilation, suggestive of distal colonic obstruction.       Review of Systems   Constitutional:  Positive for appetite change and fatigue. Negative for unexpected weight change.   HENT: Negative.     Eyes: Negative.    Respiratory:  Positive for chest tightness.    Cardiovascular: Negative.    Gastrointestinal:   Positive for abdominal pain, blood in stool, nausea and vomiting.   Endocrine: Negative.    Genitourinary: Negative.    Musculoskeletal: Negative.    Skin: Negative.    Allergic/Immunologic: Negative.    Neurological: Negative.    Hematological: Negative.  Negative for adenopathy.   Psychiatric/Behavioral: Negative.       Allergy(ies):  Other, Penicillins, Hydrocodone, Demerol hcl [meperidine], Ibuprofen, Morphine, Oxycodone, Strawberry, and Tylenol [acetaminophen]    Medications:  Scheduled:  amLODIPine, 10 mg, Oral, Daily  artificial tears, 1 drop, Both Eyes, 4x daily  docusate sodium, 100 mg, Oral, BID  lidocaine, 1 patch, Transdermal, Daily  losartan, 50 mg, Oral, Daily  magnesium oxide, 400 mg, Oral, BID  meropenem, 500 mg, Intravenous, Q6 Hrs  meropenem, , ,   methadone, 10 mg, Oral, Daily  pantoprazole, 40 mg, Intravenous, Q12 Hrs  polyethylene glycol, 17 g, Oral, Daily  prednisoLONE acetate, 1 drop, Both Eyes, 4x daily  pregabalin, 150 mg, Oral, BID  tap water, 1 enema, Rectal, Once    Infusions:  sodium chloride, 42 mL/hr, Last Rate: 42 mL/hr (12/08/22 0820)    PRN:    HYDROcodone-acetaminophen    meropenem    naloxone    ondansetron    prochlorperazine    Past Medical History:   Diagnosis Date    Colon cancer (CMS/HCC)     Hepatitis C infection     treated 2015    Hypertension     Ovarian cancer (CMS/HCC)  PAD (peripheral artery disease) (CMS/HCC)      Past Surgical History:   Procedure Laterality Date    COLON SURGERY      OVARY SURGERY       Family History:  family history is not on file.    Social History:   reports that she has never smoked. She has never used smokeless tobacco. She reports that she does not currently use alcohol. She reports current drug use.    Physical Exam  Constitutional:       Appearance: She is ill-appearing.   HENT:      Head: Normocephalic.      Nose: Nose normal.      Mouth/Throat:      Mouth: Mucous membranes are moist.      Pharynx: Oropharynx is clear.   Eyes:       Extraocular Movements: Extraocular movements intact.      Conjunctiva/sclera: Conjunctivae normal.      Pupils: Pupils are equal, round, and reactive to light.   Cardiovascular:      Rate and Rhythm: Normal rate and regular rhythm.      Pulses: Normal pulses.      Heart sounds: Normal heart sounds.   Pulmonary:      Effort: Pulmonary effort is normal.      Breath sounds: Normal breath sounds.   Abdominal:      General: Abdomen is flat. Bowel sounds are normal.      Palpations: Abdomen is soft.      Tenderness: There is abdominal tenderness.   Musculoskeletal:         General: Normal range of motion.      Cervical back: Normal range of motion and neck supple.   Skin:     General: Skin is warm.      Capillary Refill: Capillary refill takes less than 2 seconds.   Neurological:      General: No focal deficit present.      Mental Status: She is alert.     Labs:  Lab Results       ID Description Status Collection Date/Time    [id number] CBC and differential (Abnormal) Final result 12/07/22 2058    [id number] Urinalysis (Abnormal) Final result 12/08/22 0737    [id number] Hepatic Function Panel () Final result 12/07/22 2058      Result Value Flag Comment    Bilirubin Total 0.6      Alkaline Phosphatase 59      Aspartate Aminotransferase 22      Alanine Aminotransferase 7      Protein Total 7.3      Albumin Level 4.1      Bilirubin Direct 0.2            [id number] Lipase (Abnormal) Final result 12/07/22 2058      Result Value Flag Comment    Lipase 5 Low           [id number] CBC and Differential (Abnormal) Final result 12/07/22 2058      Result Value Flag Comment    White Blood Cell 7.5      Red Blood Cell 3.72 Low     Hemoglobin 12.8      Hematocrit 37.6      Mean Cell Volume 101.2 High     Mean Cell Hemoglobin 34.4 High     Mean Cell Hemoglobin Concentration 34.0      Red Cell Distribution Width 15.8 High     Platelet Count 189  Mean Platelet Volume 7.1 Low     Neutrophils Automated Absolute Number 5.0      Lymphocytes  Automated Absolute Number 1.7      Monocytes Automated Absolute Number 0.8      Eosinophils Automated Absolute Number 0.0 Low     Basophils Automated Absolute Number 0.0      Neutrophils Automated Percentage 66.9      Lymphocytes Automated Percentage 22.4      Monocytes Automated Percentage 10.3      Eosinophils Automated Percentage 0.2      Basophils Automated Percentage 0.2            [id number] Urinalysis (Abnormal) Final result 12/08/22 0737      Result Value Flag Comment    UA Collection Method Clean Catch      UA Glucose (dipstick) Negative      UA Bilirubin (dipstick) Negative      UA Ketones (dipstick) 40 mg/dL Abnormal     UA Specific Gravity (dipstick) >1.030 High     UA Blood (dipstick) Negative      UA pH (dipstick) 6.0      UA Protein (dipstick) Trace Abnormal     UA Urobilinogen (dipstick) 0.2 mg/dL      UA Nitrite (dipstick) Negative      UA Leukocyte Esterase (dipstick) Negative            [id number] hsTroponin I (Abnormal) Final result 12/07/22 2058      Result Value Flag Comment    Troponin I, High Sensitivity  17 High           [id number] Basic Metabolic Panel (Abnormal) Final result 12/07/22 2058      Result Value Flag Comment    Sodium Level 139      Potassium Level 4.2      Chloride 104      Carbon Dioxide/CO2 21 Low     Anion Gap 14      Glucose Random 70      Calcium Level Total 9.6      Blood Urea Nitrogen 22      Creatinine 0.83      BUN/Creatinine ratio 26.5 High     eGlomerular Filtration Rate African American >60  Interpretive Data:  Estimated glomerular filtration rate to be used for African American patients based on the MDRD (Modification of Diet in Renal Disease) calculation. Please refer to pharmacy personnel for calculation of a modified Cockcroft-Gault estimated CrCl used in drug dosing protocols.          [id number] Magnesium, Level (Abnormal) Final result 12/07/22 2058      Result Value Flag Comment    Magnesium Level 1.6 Low           [id number] B-type Natriuretic Peptide () Final  result 12/08/22 0003      Result Value Flag Comment    B-TYPE Natriuretic peptide 96.0            [id number] hsTroponin I (Abnormal) Final result 12/08/22 0300      Result Value Flag Comment    Troponin I, High Sensitivity  21 High           [id number] Hemoglobin and Hematocrit, Blood () Final result 12/08/22 0929      Result Value Flag Comment    Hemoglobin 12.8      Hematocrit 37.2            [id number] Hemoglobin and Hematocrit, Blood () Final result 12/08/22 1348  Result Value Flag Comment    Hemoglobin 12.9      Hematocrit 37.7            [id number] Hemoglobin and Hematocrit, Blood () Final result 12/14/2022 1621      Result Value Flag Comment    Hemoglobin 12.9      Hematocrit 37.4            [id number] Hemoglobin and Hematocrit, Blood  Needs to be Collected --    [id number] Hemoglobin and Hematocrit, Blood  Needs to be Collected --    [id number] Protime-INR () Final result 12-14-2022 0929      Result Value Flag Comment    Prothrombin Time 10.4      International Normalized Ratio 0.91  Interpretive Data:  International Normalized Ratio*  Reference Interval  = 0.9 - 1.1   Suggested therapeutic ranges* using INR for stably anticoagulated patients:  Routine oral anticoagulant therapy  =  2.0-3.0  Oral anticoagulant therapy for patients with thromboembolic events on standard dose of coumadin and those with mechanical heart valves =  2.5-3.5  *Cannot be used during the initial phase of anticoagulant therapy.  Not recommended for use with anticoagulants other than warfarin (coumadin).          [id number] Microscopic Exam, Urine (Abnormal) Final result December 14, 2022 0737      Result Value Flag Comment    UA White Blood Cell 0-2      UA Red Blood Cell 0-2      UA Bacteria Negative      UA Squamous Epithelial Cells Many Abnormal     UA Hyaline Casts 0-2 Abnormal                  Lab results have been reviewed by myself as of 12-14-22    Imaging:   Imaging (Last 24 HRS)       Procedure Component Value Units Date/Time    CT  Abdomen Pelvis W Contrast [[id number]]  (Abnormal) Collected: 2022-12-14 0000    Order Status: Completed Updated: 12/14/22 0822     F CODES P    Narrative:      EXAM DESCRIPTION:  CT ABDOMEN PELVIS W CONTRAST    ACCESSION:  [id number]    COMPLETED DATE/TIME:  12-14-22 1:59 am    REASON FOR EXAM:  Abdominal pain, acute, nonlocalized-Left lower quadrant abdominal pain with melanotic stool    COMPARISON:  11/21/2022    TECHNIQUE:  Helically acquired 5 mm images were obtained to the abdomen and pelvis following IV contrast.  Reformatted images are generated.    All CT scans performed at [facility] facilities are performed using dose optimization techniques as appropriate to exam including the following: Automated exposure control, adjustment of the mA or kVp according to patient size, and the use of iterative reconstruction techniques.    Unless otherwise stated, any renal cyst, thyroid nodule,or adrenal nodule mentioned in the report warrants no additional follow-up.  If an incidental lung nodule was noted in this report, the follow up recommendations are based on the Fleischner Society guidelines.    REPORT:  Lower thorax: Normal.    Liver: Normal.    Gallbladder and bile ducts: Normal.    Pancreas: Normal.    Spleen: Normal.    Adrenals: Normal.    Kidneys and ureters: Cortical thinning in the upper and mid left kidney and upper right kidney.  Kidneys in ureters are otherwise normal.    Vasculature: Moderate degree of  atherosclerotic plaque in the abdominal aorta and iliac arteries.  Occlusion of the left common iliac iliac arteries..    Lymph nodes:  Left inguinal lymphadenopathy with the largest lymph node measuring 14 x 16 mm and previously measured 14 x 19 mm.    Stomach and bowel: Rectal wall thickening with proximal small and large bowel dilatation.  The hepatic flexure measures 6 cm in diameter, upper limits of normal.  The distal small bowel measures up to 3.5 cm in diameter, mildly  dilated.    Urinary bladder: Normal.    Reproductive: The uterus is not visualized presumably from prior hysterectomy.    Peritoneum/Abdominal wall:  Small amount of free fluid in the pelvis.  No free intraperitoneal air.    Osseous structures: Osteoarthritic changes in the hips and thoracic and lumbar spine spondylosis.     Impression:      1. Rectal wall thickening with proximal small and large bowel dilatation suggestive of a distal colonic obstruction.  Colonic neoplasm cannot be excluded.  2. Left inguinal lymphadenopathy.  3. 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    ++++++++++++++++++++++++++++++++++++++++++++++++++++++++++++++++++  +++++CLASS CODE+++++:  P-positive findings +++++ POSITIVE +++++  ++++++++++++++++++++++++++++++++++++++++++++++++++++++++++++++++++  Digital Imaging and Communications in Medicine (DICOM) format image data are available to nonaffiliated external healthcare facilities or entities on a secure, media free, reciprocally searchable basis with patient authorization for at least a 110-monthperiod after this study.    INTERPRETING PHYSICIAN: WBud Face IV, MD  [facility]//Report ID: [id number]   Creation Date: 12/08/22 8:20 am    XR Chest 1 view PA or AP [[[id number]]Collected: 12/07/22 2055    Order Status: Completed Updated: 12/07/22 2109    Narrative:      EJasmine DecemberDESCRIPTION:  XR CHEST 1 VW    ACCESSION:  [id number]   COMPLETED DATE/TIME:  12/07/2022 9:05 pm    REASON FOR EXAM:  Shortness of breath    REPORT:  Compared with 11/21/2022.    Heart size is normal.  Subcutaneous port catheter on right its tip over the superior vena cava.  No consolidations or effusions.  Rounded opacity over the right lower hemithorax compatible with nipple shadow.     Impression:      No radiologic evidence of acute cardiopulmonary process demonstrated.    INTERPRETING PHYSICIAN: TRaelyn Mora MD  [facility]//Report ID: [id number]   Creation Date: 12/07/22 9:06 pm         Radiology  results have been reviewed by myself as of December 08, 2022    Vitals Signs:  Patient Vitals for the past 24 hrs:   BP Temp Temp src Pulse Resp SpO2 Height Weight   12/08/22 1700 132/62 -- -- 67 (!) 12 95 % -- --   12/08/22 1600 138/68 -- -- 70 (!) 13 96 % -- --   12/08/22 1542 137/74 -- -- 71 16 96 % -- --   12/08/22 1500 -- -- -- 72 (!) 12 96 % -- --   12/08/22 1400 -- -- -- 77 (!) 25 96 % -- --   12/08/22 1300 128/77 -- -- 77 (!) 14 95 % -- --   12/08/22 1200 140/70 -- -- 81 (!) 11 95 % -- --   12/08/22 1100 125/66 -- -- 70 (!) 10 95 % -- --   12/08/22 0800 146/64 -- -- 76 (!) 14 95 % -- --   12/08/22 0700 164/82 -- -- 77 (!) 12 95 % -- --  12/08/22 0600 160/76 -- -- 77 (!) 12 95 % -- --   12/08/22 0300 162/72 (!) 99.2 F (37.3 C) Oral 74 (!) 12 97 % -- --   12/08/22 0100 153/69 -- -- 76 15 96 % -- --   12/08/22 0000 136/83 -- -- 74 16 97 % -- --   12/07/22 2300 146/77 -- -- 72 16 97 % -- --   12/07/22 1934 133/84 98.2 F (36.8 C) Oral 88 20 97 % '5\' 7"'$  (1.702 m) 110 lb (49.9 kg)     Intake/Output:  No intake or output data in the 24 hours ending 12/08/22 1811    Assessment/Plan     Principal Problem:    Acute GI bleeding  - Acute lower GI bleed  - Rectal mass with concern for bowel obstruction  - History of stage 3 colorectal adenocarcinoma, right hemicolectomy, and terminal ileocectomy on 09/08/14. Did not receive adjuvant chemotherapy due to recurrent hospitalization.  - Stage IV high-grade ovarian cancer on Mirvetuximab soravtansin.  - Cancer-related pain.  - Nausea or vomiting.    Recommendation:  - Agree with GI evaluation. Patient has a history of colorectal cancer, status post hemicolectomy and terminal ileocectomy. Did not receive adjuvant chemotherapy. Patient reports receiving surveillance colonoscopy with normal results, but evidence is not found per care everywhere. 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 Metachronous colorectal cancer is conceivable.  - Patient reports bowel movement.  Abdomen is soft but tender. Mirvetuximab soravtansin can cause abdominal pain, and very rarely, bowel perforation, but no evidence of free air on recent CT abdomen and pelvis.    Face to Face Patient Counseling / Coordinating Care >50% of Encounter Time: RHS F to F Enc Time (YES/NO): Yes  Total Encounter Time (in minutes): 50      Electronically signed by Doreatha Lew, MD at 12/08/2022  6:11 PM EST

## 2022-12-08 NOTE — Nursing Note (Signed)
Formatting of this note is different from the original.  Indications Met   Last Updated by Lucia Estelle, RN on 12/08/2022 1406    Review Status Created By   Primary Completed Lucia Estelle, RN     Criteria Review   Gastrointestinal Bleeding, Upper Clinical Indications for Admission to Inpatient Care    Overall Determination: Indications Met    Criteria:  '[]'$  Admission is indicated for  1 or more  of the following  (1) (2) (3) (4):      '[]'$  Peritoneal signs          12/08/2022  2:06 PM              -- 12/08/2022  2:06 PM by Lucia Estelle, RN --                    (X) Peritoneal signs, as indicated by  1 or more  of the following  (1) (2):                  (X) Involuntary guarding    Notes:  -- 12/08/2022  2:06 PM by Lucia Estelle, RN --      62 yo female with PMH of colon and ovarian cancer, on active chemotherapy, HTN, PVD, and Hep C present to ED with abdominal pain, blood in stool and n/v. Pt is bradypneic 10 ; Troponin I, High Sensitivity 21 ; Lipase 5 .Positive fecal occult blood . Possible EGD pending.       CT abdomen/pelvis show Rectal wall thickening with proximal small and large bowel dilatation suggestive of a distal colonic obstruction. Colonic neoplasm cannot be excluded. Left inguinal lymphadenopathy.      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      Electronically signed by Lucia Estelle, RN at 12/08/2022  2:07 PM EST

## 2022-12-09 NOTE — H&P (Signed)
Formatting of this note is different from the original.  Images from the original note were not included.          History and Physical    Assessment/Plan    62 y.o. female with h/o colon and ovarian cancer s/p Right hemicolectomy, left salpingo-oophorectomy and recurrence of her ovarian cancer that has metastasized and currently on chemotherapy. She is now s/p PPD#0 from flexible sig w/biopsy with abd distension and finding of interval development of frank pneumoperitoneum on abd xray. During the procedure the colon was insufflated with CO 2 and noted a severe stenosis was found in the recto-sigmoid colon and was non-traversed; biopsies were taken with a cold forceps for histology. Pt is currently hemodynamically normal however she is exhibiting mild peritonitis. Concerns for bowel perforation was discussed with pt and family members at bed side. Explained to the pt the need for surgical intervention and the risk of the procedure due to her metastatic disease process in her abdomen. Also the risk of non-op management of the possible perforation could lead to intraabdominal infection and sepsis. After along discussion with pt and family would like to proceed with surgery. Plan to take pt to OR for emergent explorative laparotomy.     Rowland Lathe, DO  General Surgery PGY1  ACS Pager [phone]    Principal Problem:    Acute GI bleeding  Active Problems:    Chronic hepatitis C virus infection (CMS/HCC)    HTN (hypertension)    Vascular problem    Pneumoperitoneum      CC: Dark Tarry stool     Subjective     HPI  Sue [name] is an 62 y.o. female with h/o colon and ovarian cancer s/p Right hemicolectomy, left salpingo-oophorectomy and recurrence of her ovarian cancer that has metastasized and currently on chemotherapy and as well as HTN, PVD, Hep C (treated) presented to ED yesterday with 2 days hx of  passing dark , tarry stools and developing LLQ abdominal pain. She also had CT findings of rectal wall thickening with  proximal small and large bowel dilatation suggestive of a distal colonic obstruction. Due to high suspicion of possible GI bleed, pt was consented to undergo a colonoscopy this AM. After the procedure, pt reports that her abd felt very distended and tender. She was not able to find comfortable position due to the discomfort, she is not able to pass gas. She was also experiencing significant nausea therefore NGT was placement while she was in the ED.       Past Medical History:   Diagnosis Date    Colon cancer (CMS/HCC)     Hepatitis C infection     treated 2015    Hypertension     Ovarian cancer (CMS/HCC)     PAD (peripheral artery disease) (CMS/HCC)        Past Surgical History:   Procedure Laterality Date    COLON SURGERY      OVARY SURGERY       Social History     Socioeconomic History    Marital status: Married   Tobacco Use    Smoking status: Never    Smokeless tobacco: Never   Vaping Use    Vaping Use: Never used   Substance and Sexual Activity    Alcohol use: Not Currently    Drug use: Yes     Comment: marijuana occasionally    Sexual activity: Defer   Social History Narrative    Drives a car  Social Determinants of Health     Intimate Partner Violence: Unknown (12/07/2022)    Humiliation, Afraid, Rape, and Kick questionnaire     Physically Abused: No     Review of Systems    Objective     BP 126/66   Pulse 85   Temp 98.9 F (37.2 C)   Resp (!) 25   Ht '5\' 7"'$  (1.702 m)   Wt 110 lb (49.9 kg)   SpO2 98%   BMI 17.23 kg/m     Physical Exam  Constitutional:       General: She is not in acute distress.     Appearance: She is not ill-appearing.   Cardiovascular:      Rate and Rhythm: Normal rate and regular rhythm.   Pulmonary:      Effort: Pulmonary effort is normal.      Breath sounds: Normal breath sounds.   Abdominal:      General: There is distension.      Tenderness: There is abdominal tenderness. There is guarding.      Comments: Old midline incision well healed   Neurological:      General: No  focal deficit present.      Mental Status: She is alert and oriented to person, place, and time. Mental status is at baseline.   Psychiatric:         Mood and Affect: Mood normal.     Results from last 7 days   Lab Units 12/09/22  0055 12/08/22  1957 12/08/22  1621 12/07/22  2059 12/07/22  2058   WBC k/mcL 4.5*  --   --   --  7.5   HEMOGLOBIN gm/dL 12.4 13.2 12.9   < > 12.8   HEMOGLOBIN POC   --   --   --    < >  --    HEMATOCRIT % 36.3* 39.9 37.4   < > 37.6   HEMATOCRIT POC   --   --   --    < >  --    PLATELETS k/mcL 162  --   --   --  189   NEUTROPHILS % Rel % 56.9  --   --   --  66.9   LYMPHS PCT MAN Rel % 25.8  --   --   --  22.4   MONOS PCT Rel % 16.7  --   --   --  10.3   EOS PCT Rel % 0.4  --   --   --  0.2   BASOS PCT Rel % 0.2  --   --   --  0.2    < > = values in this interval not displayed.     Results from last 7 days   Lab Units 12/09/22  0055 12/07/22  2059 12/07/22  2058   SODIUM mmol/L 138 138 139   POTASSIUM mmol/L 3.7 4.1 4.2   CHLORIDE mmol/L 100 102 104   CO2 mmol/L 20* 23 21*   BUN mg/dL '15 22 22   '$ CREATININE mg/dL 0.61 0.80 0.83   GLUCOSE mg/dL 72 77 70   CALCIUM mg/dL 9.1  --  9.6     Results from last 7 days   Lab Units 12/09/22  0055 12/07/22  2058   AST U/L 17 22   ALT U/L 9 7   ALK PHOS U/L 58 59   BILIRUBIN TOTAL mg/dL 0.6 0.6   BILIRUBIN DIRECT mg/dL  --  0.2  ALBUMIN gm/dL 3.7 4.1     Results from last 7 days   Lab Units 12/08/22  0929   INR RATIO 0.91     Imaging (Last 3 days)       Procedure Component Value Units Date/Time    XR Abdomen 1 view [[id number]]  (Abnormal) Collected: 2022/12/31 1830    Order Status: Completed Updated: 12/31/22 1847     F CODES P    Narrative:      EXAM DESCRIPTION:  XR ABDOMEN 1 VW    ACCESSION:  [id number]    COMPLETED DATE/TIME:  December 31, 2022 6:35 pm    REASON FOR EXAM:  tube placement    COMPARISON:  May 10, 2023    TECHNIQUE:  Single AP view of the abdomen obtained.    REPORT:  Partial retraction of nasoenteric tube with tip projecting over the left  upper quadrant.  Persistent gross pneumoperitoneum.  Air-filled distended large bowel loops present within the inferior abdomen measuring up to 8.0 cm in diameter.  Lung bases are clear.     Impression:      1. Nasoenteric tube in adequate position.  2. Persistent gross pneumoperitoneum.  3. Distended large bowel loops which may represent ileus or obstruction.    ++++++++++++++++++++++++++++++++++++++++++++++++++++++++++++++++++  +++++CLASS CODE+++++:  P-positive findings +++++ POSITIVE +++++  ++++++++++++++++++++++++++++++++++++++++++++++++++++++++++++++++++    INTERPRETING PHYSICIAN: Lyndon Code, MD  VWC//Report ID: [id number]    Creation Date: 12/31/2022 6:44 pm    XR Abdomen 1 view [[id number]]  (Abnormal) Collected: 12-31-2022 1800    Order Status: Completed Updated: 12-31-2022 1845     F CODES C    Narrative:      EXAM DESCRIPTION:  XR ABDOMEN 1 VW    ACCESSION:  [id number]    COMPLETED DATE/TIME:  December 31, 2022 6:35 pm    REASON FOR EXAM:  Abdominal distension    COMPARISON:  2022/12/31, 7:09 a.m.    TECHNIQUE:  Single AP view of the abdomen obtained.    REPORT:  Interval development of frank pneumoperitoneum.  Interval placement of nasoenteric tube coiled within the stomach.  Interval placement right IJ catheter with tip projecting over the right atrium.  Recommend retracting 6 cm.     Impression:      1. Interval development of frank pneumoperitoneum.  2. Recommend retracting right IJ catheter 6 cm.    The results regarding pneumoperitoneum were communicated to the emergency department charge nurse Wyline Copas ( on December 31, 2022 at 6:22 p.m..    +++++++++++++++++++++++++++++++++++++++++++++++++++++++++++++++++++  +++++CLASS CODE+++++: C-critical value +++++ CRITICAL VALUE +++++  +++++++++++++++++++++++++++++++++++++++++++++++++++++++++++++++++++    INTERPRETING PHYSICIAN: Lyndon Code, MD  VWC//Report ID: [id number]    Creation Date: 2022/12/31 6:43 pm    XR Abdomen 1 view [[id number]]  (Abnormal)  Collected: 12-31-22 0000    Order Status: Completed Updated: December 31, 2022 0946     F CODES P    Narrative:      EXAM DESCRIPTION:  XR ABDOMEN 1 VW    ACCESSION:  [id number]    COMPLETED DATE/TIME:  12/31/2022 7:20 am    REASON FOR EXAM:  Abdominal distension    COMPARISON:  CT abdomen pelvis December 08, 2022.    TECHNIQUE:  Frontal abdominal radiographs.    REPORT:  Diffusely gas distended colonic loops, consistent with known colonic obstruction.  No organomegaly.  Partially seen lung bases are clear.  Osteopenia.  Degenerative changes of lumbar spine and bilateral hips.     Impression:      Diffusely gas  distended colonic loops, consistent with known colonic obstruction seen on CT.    ++++++++++++++++++++++++++++++++++++++++++++++++++++++++++++++++++  +++++CLASS CODE+++++:  P-positive findings +++++ POSITIVE +++++  ++++++++++++++++++++++++++++++++++++++++++++++++++++++++++++++++++    INTERPRETING PHYSICIAN: Rosana Hoes, MD  YS//Report ID: [id number]    Creation Date: 12/09/22 9:44 am    CT Abdomen Pelvis W Contrast [[id number]]  (Abnormal) Collected: 12/09/2022 0000    Order Status: Completed Updated: December 09, 2022 0822     F CODES P    Narrative:      EXAM DESCRIPTION:  CT ABDOMEN PELVIS W CONTRAST    ACCESSION:  [id number]    COMPLETED DATE/TIME:  2022-12-09 1:59 am    REASON FOR EXAM:  Abdominal pain, acute, nonlocalized-Left lower quadrant abdominal pain with melanotic stool    COMPARISON:  11/21/2022    TECHNIQUE:  Helically acquired 5 mm images were obtained to the abdomen and pelvis following IV contrast.  Reformatted images are generated.    All CT scans performed at [facility] facilities are performed using dose optimization techniques as appropriate to exam including the following: Automated exposure control, adjustment of the mA or kVp according to patient size, and the use of iterative reconstruction techniques.    Unless otherwise stated, any renal cyst, thyroid nodule,or adrenal nodule mentioned in  the report warrants no additional follow-up.  If an incidental lung nodule was noted in this report, the follow up recommendations are based on the Fleischner Society guidelines.    REPORT:  Lower thorax: Normal.    Liver: Normal.    Gallbladder and bile ducts: Normal.    Pancreas: Normal.    Spleen: Normal.    Adrenals: Normal.    Kidneys and ureters: Cortical thinning in the upper and mid left kidney and upper right kidney.  Kidneys in ureters are otherwise normal.    Vasculature: Moderate degree of atherosclerotic plaque in the abdominal aorta and iliac arteries.  Occlusion of the left common iliac iliac arteries..    Lymph nodes:  Left inguinal lymphadenopathy with the largest lymph node measuring 14 x 16 mm and previously measured 14 x 19 mm.    Stomach and bowel: Rectal wall thickening with proximal small and large bowel dilatation.  The hepatic flexure measures 6 cm in diameter, upper limits of normal.  The distal small bowel measures up to 3.5 cm in diameter, mildly dilated.    Urinary bladder: Normal.    Reproductive: The uterus is not visualized presumably from prior hysterectomy.    Peritoneum/Abdominal wall:  Small amount of free fluid in the pelvis.  No free intraperitoneal air.    Osseous structures: Osteoarthritic changes in the hips and thoracic and lumbar spine spondylosis.     Impression:      1. Rectal wall thickening with proximal small and large bowel dilatation suggestive of a distal colonic obstruction.  Colonic neoplasm cannot be excluded.  2. Left inguinal lymphadenopathy.  3. 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    ++++++++++++++++++++++++++++++++++++++++++++++++++++++++++++++++++  +++++CLASS CODE+++++:  P-positive findings +++++ POSITIVE +++++  ++++++++++++++++++++++++++++++++++++++++++++++++++++++++++++++++++  Digital Imaging and Communications in Medicine (DICOM) format image data are available to nonaffiliated external healthcare facilities or entities  on a secure, media free, reciprocally searchable basis with patient authorization for at least a 4-monthperiod after this study.    INTERPRETING PHYSICIAN: WBud Face IV, MD  W[facility]//Report ID: [id number]   Creation Date: 001/16/248:20 am    XR Chest 1 view PA or AP [[[id number]]Collected: 12/07/22 2055  Order Status: Completed Updated: 12/07/22 2109    Narrative:      Jasmine December DESCRIPTION:  XR CHEST 1 VW    ACCESSION:  [id number]    COMPLETED DATE/TIME:  12/07/2022 9:05 pm    REASON FOR EXAM:  Shortness of breath    REPORT:  Compared with 11/21/2022.    Heart size is normal.  Subcutaneous port catheter on right its tip over the superior vena cava.  No consolidations or effusions.  Rounded opacity over the right lower hemithorax compatible with nipple shadow.     Impression:      No radiologic evidence of acute cardiopulmonary process demonstrated.    INTERPRETING PHYSICIAN: Raelyn Mora, MD  TAP//Report ID: [id number]    Creation Date: 12/07/22 9:06 pm         Portions of the record may have been created with voice recognition software. Occasional wrong word or "sound a like" substitutions may have occurred due to the inherent limitations of voice recognition software. Read the chart carefully and recognize, using context, where substitutions have occurred.   Electronically signed by Ted Mcalpine, MD at 12/10/2022  2:55 AM EST    Associated attestation - Ted Mcalpine, MD - 12/10/2022  2:55 AM EST  Formatting of this note is different from the original.  I saw and evaluated the patient, participating in the key portions of the service.  I reviewed the resident?s note.  I agree with the resident?s findings and plan.     62 year old female with known metastatic ovarian cancer who underwent attempted flexible sigmoidoscopy earlier today with Gastroenterology.  Noted to have pneumoperitoneum after the procedure consistent with perforated viscus.    Long discussion with patient and her family.  Suspect the distal  obstructing area seen on colonoscopy represents either recurrent colon cancer or external compression secondary to known pelvic disease from stage IV ovarian adenocarcinoma.  Reviewed that with known carcinomatosis, there is the potential that no surgical intervention will be able to be performed.  Patient had evidence colonic obstruction by CT prior to her procedure.  Review of EMR indicates that patient has been offered hospice and transition of goals of care towards comfort.  She has continued to pursue cancer treatment.  She voices that she understands the associated risks but wishes to proceed with operative exploration.  All questions answered at this time.    Ted Mcalpine, MD  Trauma and Critical Care

## 2022-12-09 NOTE — Progress Notes (Signed)
Formatting of this note might be different from the original.  Per kub report   Interval development of frank pneumoperitoneum.  Interval placement of nasoenteric tube coiled within the stomach.  Interval placement right IJ catheter with tip projecting over the right atrium.  Recommend retracting 6 cm.     recommended call made to surgeon will need to retract both ng tube and IJ   Electronically signed by Peggye Pitt, MD at 12/09/2022  7:42 PM EST

## 2022-12-09 NOTE — Progress Notes (Signed)
Formatting of this note is different from the original.  [place] [facility] Day: 3    SUBJECTIVE:  Patient seen and examined this morning, she is doing okay except for continued having abdominal pain which is generalized mostly.    Husband at bedside.    She has been NPO waiting procedure    ROS - all other systems has been reviewed they are negative except for what mentioned above.    Initial Presentation :  Abdominal pain, nausea vomiting     OBJECTIVE:  Scheduled Medications: [MAR Hold] amLODIPine, 10 mg, Oral, Daily  [MAR Hold] artificial tears, 1 drop, Both Eyes, 4x daily  [MAR Hold] cefepime, 1 g, Intravenous, Q6 Hrs  [MAR Hold] docusate sodium, 100 mg, Oral, BID  [MAR Hold] lidocaine, 1 patch, Transdermal, Daily  [MAR Hold] losartan, 50 mg, Oral, Daily  [MAR Hold] magnesium oxide, 400 mg, Oral, BID  [MAR Hold] methadone, 10 mg, Oral, Daily  [MAR Hold] metroNIDAZOLE, 500 mg, Oral, TID  [MAR Hold] pantoprazole, 40 mg, Intravenous, Q12 Hrs  [MAR Hold] polyethylene glycol, 17 g, Oral, Daily  [MAR Hold] prednisoLONE acetate, 1 drop, Both Eyes, 4x daily  [MAR Hold] pregabalin, 150 mg, Oral, BID  [MAR Hold] tap water, 1 enema, Rectal, Once  [MAR Hold] tap water, 1 enema, Rectal, Once    Continuous Infusions: sodium chloride, 42 mL/hr, Last Rate: 42 mL/hr (12/08/22 0820)    No intake or output data in the 24 hours ending 12/09/22 1541    PHYSICAL EXAMINATION:  Blood pressure 110/62, pulse 78, temperature 98.9 F (37.2 C), resp. rate (!) 14, height '5\' 7"'$  (1.702 m), weight 110 lb (49.9 kg), SpO2 97 %.  General Appearance:  awake, alert, in no acute distress, well developed, well nourished and in no acute distress  Skin:  skin color, texture, turgor are normal; there are no bruises, rashes or lesions. Good capillary refill and perfusion.  Head/face:  NCAT  Eyes:  PERRL, EOMI, Sclera nonicteric and Conjunctiva pink.  Neck:  neck- supple, no mass, non-tender  Lungs:  Normal expansion.  Clear to  auscultation.  No rales, rhonchi, or wheezing., No chest wall tenderness.  Heart:  Heart sounds are normal.  Regular rate and rhythm without murmur, gallop or rub.  Abdomen:  Soft, non-tender, normal bowel sounds; no bruits, organomegaly or masses.  Extremities: Extremities warm to touch, pink, with no edema.  Neurologic:  Alert and oriented x 3,  strength and  sensation grossly normal  Psych exam:alert, in NAD with a full range of affect, normal behavior and no psychotic features     LABS/Work Up:  Results from last 7 days   Lab Units 12/09/22  0055 12/07/22  2059 12/07/22  2058   SODIUM mmol/L 138 138 139   POTASSIUM mmol/L 3.7 4.1 4.2   CHLORIDE mmol/L 100 102 104   CO2 mmol/L 20* 23 21*   BUN mg/dL '15 22 22   '$ CREATININE mg/dL 0.61 0.80 0.83   CALCIUM mg/dL 9.1  --  9.6   TOTAL PROTEIN gm/dL 6.8  --  7.3   ALBUMIN gm/dL 3.7  --  4.1   BILIRUBIN TOTAL mg/dL 0.6  --  0.6   ALK PHOS U/L 58  --  59   AST U/L 17  --  22   ALT U/L 9  --  7   ANION GAP mmol/L 18*  --  14     Results from last 7 days  Lab Units 12/09/22  0055 12/08/22  1957 12/08/22  1621 12/07/22  2059 12/07/22  2058   WBC k/mcL 4.5*  --   --   --  7.5   HEMOGLOBIN gm/dL 12.4 13.2 12.9   < > 12.8   HEMOGLOBIN POC   --   --   --    < >  --    HEMATOCRIT % 36.3* 39.9 37.4   < > 37.6   HEMATOCRIT POC   --   --   --    < >  --    MCV fL 100.8*  --   --   --  101.2*   RBC Mil/mcL 3.60*  --   --   --  3.72*   MCH pg 34.6*  --   --   --  34.4*   MCHC gm/dL 34.3  --   --   --  34.0   PLATELETS k/mcL 162  --   --   --  189   RDW % 15.8*  --   --   --  15.8*   MPV fL 7.2*  --   --   --  7.1*    < > = values in this interval not displayed.     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      Best Practices :  DVT Prophylaxis - SCD only   Disposition - TBD   Goals of Care: Full Code;    I've used voice to text software in preparing this note, which may cause misinterpretation of vocabulary, grammar, structure, and note format.If there are substantial concerns about the content of this note that may affect patient care, please contact me for clarification.    Willow Ora Shukr,MD  Hospitalist  12/09/2022 3:41 PM    Electronically signed by Dion Saucier, MD at 12/09/2022  3:48 PM EST

## 2022-12-09 NOTE — Nursing Note (Signed)
Formatting of this note might be different from the original.  ..First procedure completed, pt repositioned for second procedure.    Electronically signed by Peyton Bottoms, RN at 12/09/2022  3:17 PM EST

## 2022-12-09 NOTE — Interval H&P Note (Signed)
Formatting of this note might be different from the original.  H&P reviewed. The patient was examined and there are no changes to the H&P.  Electronically signed by Loel Dubonnet, MD at 12/09/2022  3:06 PM EST    Source Note - Sherley Bounds, PA - 12/08/2022  9:20 AM EST  Formatting of this note is different from the original.  GASTROENTEROLOGY CONSULTATION    Asked to see patient by Bonney Leitz, MD for GI bleed     CC: black stools  HPI: Sue [name] is a 62 y.o. female with metastatic ovarian cancer currently on chemotherapy, history of colon cancer s/p right hemicolectomy, peripheral vascular disease, and hypertension presented to the emergency room yesterday with black stools starting on Thursday.  Patient had chemotherapy on Wednesday and then started having black stools on Thursday.  Patient states she is having 5-6 bowel movements per day.  Patient did take Pepto-Bismol on Thursday.  Rectal exam in the ER revealed dark, tarry stools.  Hemoglobin this morning is still pending but last night it was 12.8.  CT shows rectal wall thickening with proximal small and large bowel dilatation suggestive of distal colonic obstruction.  Patient states she does have left-sided abdominal pain and has been throwing up.  Patient denies any coffee-ground emesis or hematemesis.    Patient was diagnosed with ovarian cancer and colon cancer in 2015.  Patient had right hemicolectomy in her last colon was in [place] in 2015.  Patient had a CT imaging done in December 2023 with Oncology and there is concern for recurrent colon cancer.  Patient did have stage IV ovarian cancer and then went into remission until summer 2023.  There is lung involvement of cancer and patient started chemotherapy.      Anticoagulation / Anti plt use: none    Past Medical History:   Past Medical History:   Diagnosis Date    Colon cancer (CMS/HCC)     Hepatitis C infection     treated 2015    Hypertension     Ovarian cancer (CMS/HCC)     PAD  (peripheral artery disease) (CMS/HCC)      Past Surgical History:  Past Surgical History:   Procedure Laterality Date    COLON SURGERY      OVARY SURGERY       Family History:  No family history on file.    Medications:    Current Facility-Administered Medications:     amLODIPine (NORVASC) tablet 10 mg, 10 mg, Oral, Daily, Bernette Redbird III, MD, 10 mg at 12/08/22 7371    artificial tears ophthalmic solution 1 drop, 1 drop, Both Eyes, 4x daily, Bernette Redbird III, MD    docusate sodium (COLACE) capsule 100 mg, 100 mg, Oral, BID, Bernette Redbird III, MD, 100 mg at 12/08/22 0626    HYDROcodone-acetaminophen (NORCO) 5-325 MG per tablet 1 tablet, 1 tablet, Oral, Q4 Hrs PRN, Bernette Redbird III, MD, 1 tablet at 12/08/22 0822    lidocaine (LIDOCARE) 4 % 1 patch, 1 patch, Transdermal, Daily, Bernette Redbird III, MD, 1 patch at 12/08/22 9485    losartan (COZAAR) tablet 50 mg, 50 mg, Oral, Daily, Bernette Redbird III, MD, 50 mg at 12/08/22 4627    magnesium oxide tablet 400 mg, 400 mg, Oral, BID, Bernette Redbird III, MD, 400 mg at 12/08/22 0823    meropenem (MERREM) 500 mg in sterile water (PF) syringe, 500 mg, Intravenous, Q6 Hrs, Chiquita Loth, MD  methadone (DOLOPHINE) 10 mg tablet **TIME CRITICAL MED**, 10 mg, Oral, Daily, Bernette Redbird III, MD    naloxone [facility]) injection 0.4 mg, 0.4 mg, Intravenous, PRN, Bernette Redbird III, MD    ondansetron [facility]) tablet 8 mg, 8 mg, Oral, Q8 Hrs PRN, Bernette Redbird III, MD    pantoprazole (PROTONIX) injection 40 mg, 40 mg, Intravenous, Q12 Hrs, Bernette Redbird III, MD    polyethylene glycol (GLYCOLAX) packet 17 g, 17 g, Oral, Daily, Bernette Redbird III, MD    prednisoLONE acetate (PRED FORTE) 1 % ophthalmic suspension 1 drop, 1 drop, Both Eyes, 4x daily, Bernette Redbird III, MD    pregabalin (LYRICA) capsule 150 mg, 150 mg, Oral, BID, Bernette Redbird III, MD, 150 mg at 12/08/22 3235    prochlorperazine (COMPAZINE) injection 5 mg, 5 mg,  Intravenous, Q4 Hrs PRN, Bernette Redbird III, MD    sodium chloride 0.9 % infusion, 42 mL/hr, Intravenous, Continuous, Bernette Redbird III, MD, Last Rate: 42 mL/hr at 12/08/22 0820, 42 mL/hr at 12/08/22 0820    tap water enema, 1 enema, Rectal, Once, Bernette Redbird III, MD    Current Outpatient Medications:     amLODIPine (NORVASC) 10 MG tablet, Take 10 mg by mouth daily, Disp: , Rfl:     dexamethasone (DECADRON) 4 MG tablet, Take 8 mg daily x 2 days after each chemo day, Disp: 60 tablet, Rfl: 2    lidocaine (LIDODERM) 5 %, Apply 1 patch topically daily Remove & discard patch within 12 hours or as directed by MD., Disp: , Rfl:     lidocaine-prilocaine (EMLA) cream, Apply topically as needed for mild pain (PSR 1-3), Disp: , Rfl:     lidocaine-prilocaine (EMLA) cream, Apply cream generously over port site and cover with plastic, at least 1 hour prior to each treatment appointment, Disp: 30 g, Rfl: 2    losartan (COZAAR) 50 MG tablet, Take 50 mg by mouth daily, Disp: , Rfl:     Lyrica 200 MG capsule, Take 1 capsule (200 mg total) by mouth 2 (two) times a day, Disp: 60 capsule, Rfl: 5    Magnesium ([employer] TRIPLE MAGNESIUM COMPLEX) 400 MG capsule, Take 400 mg by mouth 2 (two) times a day, Disp: 56 capsule, Rfl: 0    methadone (DOLOPHINE) 10 MG tablet, , Disp: , Rfl:     ondansetron (ZOFRAN) 8 MG tablet, Take 8 mg by mouth every 8 (eight) hours as needed for nausea or vomiting As needed, Disp: , Rfl:     ondansetron (ZOFRAN) 8 MG tablet, Take 1 tablet (8 mg total) by mouth every 8 (eight) hours as needed for nausea or vomiting Indications: Nausea and Vomiting caused by Cancer Chemotherapy Start second day after chemo, Disp: 30 tablet, Rfl: 2    Polyethyl Glyc-Propyl Glyc PF (Lubricant Eye Drops, PF,) 0.4-0.3 % solution, Administer 1 drop into both eyes 4 (four) times a day During treatment with mirvetuximab soravtansine instill 4 times daily and as needed for dry eyes., Disp: 10 mL, Rfl: 3    prednisoLONE acetate  (PRED FORTE) 1 % ophthalmic suspension, Administer 1 drop into both eyes 6 (six) times a day Starting the day prior to each infusion, use one drop in each eye 6 times a day until day 4; then use 1 drop in each eye 4 times a day on days 5-8 of each cycle., Disp: 15 mL, Rfl: 2    acetaminophen (TYLENOL) 325 MG tablet, Take 325  mg by mouth every 6 (six) hours as needed for mild pain (PSR 1-3) As needed, Disp: , Rfl:     docusate sodium (COLACE) 100 MG capsule, Take 100 mg by mouth 2 (two) times a day As needed, Disp: , Rfl:     HYDROcodone-acetaminophen (NORCO) 5-325 MG per tablet, Take 1-2 tablets by mouth every 6 (six) hours as needed for moderate pain (PSR 4-6), Disp: 60 tablet, Rfl: 0    methylPREDNISolone 4 MG tablet therapy pack, use as directed FOLLOW DIRECTIONS ON BACK OF FOIL PACK, Disp: , Rfl:     pregabalin (LYRICA) 200 MG capsule, Take 1 capsule (200 mg total) by mouth 2 (two) times a day, Disp: 60 capsule, Rfl: 0    prochlorperazine (COMPAZINE) 10 MG tablet, Take 10 mg by mouth every 6 (six) hours as needed for nausea or vomiting As needed, Disp: , Rfl:     prochlorperazine (COMPAZINE) 10 MG tablet, Take 1 tablet (10 mg total) by mouth every 6 (six) hours as needed (Nausea and Vomiting), Disp: 90 tablet, Rfl: 2    Allergies:  Allergies   Allergen Reactions    Other Hives and Itching     Highly acidic foods "oranges and tomatoes". Pt breaks out and itches    Penicillins Other (see comments)     Childhood allergy    Hydrocodone Hives    Demerol Hcl [Meperidine] GI intolerance     Nausea/Vomiting    Ibuprofen GI intolerance     Nausea and vomiting    Morphine GI intolerance     Nausea/Vomiting    Oxycodone Itching    Strawberry Itching     Fruit and strawberry flavoring    Tylenol [Acetaminophen] GI intolerance     Nausea and Vomiting     Iodine allergy:no     Social History     Socioeconomic History    Marital status: Married   Tobacco Use    Smoking status: Never    Smokeless tobacco: Never   Vaping Use     Vaping Use: Never used   Substance and Sexual Activity    Alcohol use: Not Currently    Drug use: Yes     Comment: marijuana occasionally    Sexual activity: Defer   Social History Narrative    Drives a car     Social Determinants of Health     Intimate Partner Violence: Unknown (12/07/2022)    Humiliation, Afraid, Rape, and Kick questionnaire     Physically Abused: No     Review of Systems   Constitutional:  Negative for chills and fever.   HENT:  Negative for sore throat and trouble swallowing.    Respiratory:  Positive for shortness of breath.    Cardiovascular:  Negative for chest pain.   Gastrointestinal:  Positive for abdominal pain, blood in stool, diarrhea, nausea and vomiting.   Neurological:  Negative for dizziness.     PHYSICAL EXAM:  Vitals:    12/08/22 0800   BP: 146/64   Pulse: 76   Resp: (!) 14   Temp:    SpO2: 95%     Gen :  Frail, cachectic female  Lungs : Clear bilaterally. No wheezes or crackles  Heart : S1, S2 heard. Regular  Abd :  Hyperactive bowel sounds, mild distention, firm, tender on palpation of left side of abdomen  Ext : No edema.  Neuro : Alert, awake, oriented.  Psych :  Tearful at times  Skin : no rash or  jaundice.    Labs:    Lab Results   Component Value Date    ALT 7 12/07/2022    AST 22 12/07/2022    ALKPHOS 59 12/07/2022    BILITOT 0.6 12/07/2022     Lab Results   Component Value Date    WBC 7.5 12/07/2022    HGB 13.3 12/07/2022    HCT 39.0 12/07/2022    MCV 101.2 (H) 12/07/2022    PLT 189 12/07/2022     Assessment/Plan:  Black stools:   Patient did take Pepto-Bismol around the same time that the black stools appeared.  Hemoglobin was in the 12+ night.  Unclear if black stools are from Pepto-Bismol use or an upper GI bleed.    -monitor H&H and transfuse as needed.  -monitor stool output.    -continue PPI b.i.d..    -avoid NSAIDs.  -consider EGD tomorrow.    Rectal wall thickening with small and large bowel dilatation seen on CT:   Concerning for partial distal obstruction.   Patient is still passing stool.  There is concern for recurrence of colon cancer.      -recommend serial abdominal exams.    -continue NPO status.    -ordered KUB for the a.m..    -okay to continue bowel regimen ordered by primary team.    -consider flex sigmoidoscopy with enema was tomorrow.    Thank you for involving our service in the care of Brennah Loiseau.  Additional recommendations or any changes to the plan of care may follow per GI attending, Dr. West Pugh.  Please call with any questions.    This note was prepared using voice recognition software and may contain inadvertent typographical errors.     Electronically Signed By: Jack Quarto, PA 12/08/2022 9:20 AM   Electronically signed by Loel Dubonnet, MD at 12/08/2022 11:31 AM EST

## 2022-12-09 NOTE — Nursing Note (Signed)
Formatting of this note might be different from the original.  Changed Abd xray, to one view KUB to check tube placement.  Electronically signed by Franki Cabot, RN at 12/09/2022  6:22 PM EST

## 2022-12-09 NOTE — Procedures (Signed)
Formatting of this note might be different from the original.  Procedure Note    Procedure(s):  EGD  FLEXIBLE SIGMOID W/BX &/OR POLYPECTOMY    Indications:     Pre-op Diagnosis     * Acute GI bleeding [K92.2]    Post-op Diagnosis     * Acute GI bleeding [K92.2]    Procedure Details     Findings:  - 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  Biopsied.   - Stool in the rectum. interfering with visualization.  Please see the report for full details.  Recommendations:  - keep NPO   - given abdominal distention, and bowel obstruction on imaging called the on-call surgeons in call  they kindly accepted see her .  - I have called the primary team Dr. Autumn Messing and informed above , surgery will be consulted and advised  insert NG tube.     Electronically signed by Loel Dubonnet, MD at 12/09/2022  4:12 PM EST

## 2022-12-09 NOTE — Nursing Note (Signed)
Formatting of this note might be different from the original.  Pt stomach was very hard and distended when this nurse received patient from surge hallway. Pt complained of severe abdominal pain and wanted to go to the bathroom. Pt reports blood in commode and on tissue. This nurse saw small amount of blood in commode on tissue    After going to the bathroom, this nurse placed ng tube in patients left nare, once past the back of the nasal cavity, after pt  grabbed at ng tube in her nose several times. This nurse did not meet any resistance placing ng tube. Auscultated epigastric area numerously until this nurse heard air from syringe and saw green epigastic juice in NGT. Pt states she felt relief from NGT fairly quickly. Obtained KUB to check placement.  Electronically signed by Franki Cabot, RN at 12/09/2022  8:29 PM EST

## 2022-12-09 NOTE — Nursing Note (Signed)
Formatting of this note might be different from the original.  2nd procedure started.   Electronically signed by Peyton Bottoms, RN at 12/09/2022  3:21 PM EST

## 2022-12-09 NOTE — Procedures (Signed)
Formatting of this note is different from the original.  Procedure Note    Procedure(s):  LAPAROTOMY, EXPLORATORY;  Lysis of adhesions, gastrostomy tube placement, ostomy creation    Indications:  Pneumoperitoneum after colonoscopy       Pre-op Diagnosis     * Pneumoperitoneum [K66.8]    Post-op Diagnosis     * Pneumoperitoneum [K66.8]    Procedure Details   The patient was brought to the operating room via stretcher.  She was transferred the operating table placed in the supine position.  She had the application of bilateral lower extremity sequential compression stockings.  She had already received perioperative Zosyn.  She underwent induction of general endotracheal anesthesia without difficulty.  A Foley catheter was placed.      The abdomen was prepped and draped in standard sterile fashion.  Prior to the start of the procedure, a timeout was performed and patient's proper identity, procedure, antibiotics, and risk of fire were recognized.    A vertical midline incision was created using a 10 blade scalpel.  Bovie cautery was used to dissect down through the deep dermis and the anterior rectus fascia.  The peritoneum was grasped with hemostat clamps and elevated away from the peritoneal contents.  It was sharply incised using Metzenbaum scissors.  A finger sweep of the anterior abdominal wall was performed.  There was noted to be bowel adherent to the anterior abdominal wall at approximately 3 cm inferior to the umbilicus.  The midline was divided to this point.  The abdomen was inspected.  There was noted to be carcinomatosis with multiple bulky tumor deposits along the anterior abdominal wall as well as in the pelvis.  There were noted to be dense adhesions of much of the small intestine to itself as well as involved in the tumor burden.  The transverse colon appeared to have been the site of perforation.  There was pneumatosis throughout the transverse colon mesentery.  There was no frank spillage of stool  and the actual site of perforation was not able to be clearly visualized as it was on the mesenteric portion of the bowel..  An ileocolic anastomosis appeared to be present in the right upper quadrant with the proximal transverse colon.  Distal to the gastrocolic ligament, the transverse colon, which was markedly dilated, traversed down towards the pelvis and was adhesed secondary to a large tumor deposit to the anterior abdominal wall.  It then course back to the left upper quadrant splenic flexure.  The descending colon was also markedly dilated secondary to distal obstruction from pelvic tumor burden.  The majority of the small bowel including the portion of ileum involved in the ileal colic anastomosis was frozen within the mid abdomen.  The only portion of the transverse colon that was able to be mobilized was distal to the area of suspected perforation.  Tedious lysis of adhesions was performed to free adherent small intestine from the transverse colon mesentery.  This was done sharply using Metzenbaum scissors.  Eventually there was enough laxity of the transverse colon to allow it to be brought to the anterior abdominal wall.  Because of the extent of the carcinomatosis, and the suspicion that patient will ultimately progress 2 additional areas of obstruction, the decision was made to perform a gastrostomy tube to allow for decompression.  A stab incision was created in the left upper quadrant.  An 80 French gastrostomy tube was advanced through a tract created with a tonsil clamp.  Two circumferential 2-0  silk pursestring sutures were created in the anterior gastric body.  A gastrotomy was created using Bovie cautery.  The gastrostomy tube was then advanced into the stomach and the balloon was partially inflated.  The pursestring sutures were tied down around the gastrostomy tubing.  2-0 silk suture was then used to Stamm the stomach to the adjacent abdominal wall in 4 quadrants.  The remainder of the  balloon was filled, with a total of 10 cc.  The gastrostomy tube was then retracted until the balloon abutted the anterior abdominal wall, with the tube at approximately 4 cm mark at the skin.  A stab incision was then created in the right upper quadrant.  An 8 mm flat drain was advanced through the abdominal wall in a tonsil clamp.  The drain was placed adjacent to the transverse colon at the suspected site of perforation.  A site in the left lateral abdominal wall was then chosen for ostomy creation.  The skin was grasped with an Allis clamp and Bovie cautery was used to dissect out a 2 cm circle of skin and subcutaneous tissue at the ostomy site.  The anterior rectus sheath was divided in a cruciate fashion using Bovie cautery.  The muscle fibers were spared using dissection with Kelly clamps.  The posterior rectus fascia was then also divided in a cruciate fashion using Bovie cautery.  A defect was created in the transverse colon mesentery and a half-inch Penrose drain was placed through the defect.  The Penrose drain was brought through the ostomy defect and used to deliver the transverse colon.  The the Penrose drain was then replaced with a red rubber catheter.  The red rubber catheter was sutured to the skin using 3-0 nylon sutures and trimmed.  The midline incision fascia was then closed using a running 0-0 PDS suture.  The skin was closed with staples.  Attention was then turned to maturing the ostomy.  Bovie cautery was used to divide the anti mesenteric portion of the bowel in a transverse fashion.  Or dental sutures were used to approximate the mucocutaneous junction utilizing 3-0 Vicryl sutures.  The edges of the mucosa were everted using a right angle clamp.  The remainder of the mucosal edge was approximated to skin, also using interrupted 3-0 Vicryl sutures.  An ostomy appliance was then applied, leaving the red rubber catheter in place as a stoma bar.  Sterile dressings were then applied to the  midline in the drain exit site.  Gauze was placed beneath the flange of the gastrostomy tube.  The Foley catheter and a previously placed nasogastric tube were left in place at the close of the procedure.    Patient was then extubated and taken to recovery in stable condition.  All needle, instrument, and sponge counts were reported correct at the close of the procedure.    Ted Mcalpine, MD  Trauma and Critical Care    Findings:  Suspected transverse colon perforation; dense adhesions and tumor burden throughout the abdomen resulting in obstruction     Anesthesia: General    Staff:  Circulator: Titus Dubin, RN  Relief Circulator: Frazier Richards, RN  Scrub : Hollace Hayward, CST    Estimated Blood Loss:  30 mL    Blood Administered:     Blood Products       None           Drains:   Closed/Suction Drain Right RUQ Bulb 7 Fr. (Active)   Site Description Unable to  view 12/10/22 1526   Dressing Status Clean;Dry;Intact 12/10/22 1526   Drainage Appearance Serosanguineous 12/10/22 1526   Status To bulb suction 12/10/22 1526   Output (mL) 25 mL 12/10/22 1422     Gastrostomy/Enterostomy Gastrostomy 1 18 Fr. LUQ (Active)   Surrounding Skin Dry;Intact 12/10/22 1526   Drain Status Open to gravity drainage 12/10/22 1337   Site Description Healing 12/10/22 1526   Dressing Status Clean;Dry;Intact 12/10/22 1526   Dressing Type Open to air 12/10/22 1526   Current Rate mL/hr 0 mL/hr 12/10/22 0818   Intake (mL) 0 mL 12/10/22 0818   Output (mL) 0 mL 12/10/22 0818     Colostomy LLQ (Active)   Stomal Appliance 2 piece;Dry;Clean;Intact 12/10/22 1526   Site Assessment Clean;Intact 12/10/22 1526   Peristomal Assessment Clean;Intact 12/10/22 1526   Output (mL) 0 mL 12/10/22 1030     Urethral Catheter Temperature probe 16 Fr. (Active)   Site Assessment Date/Time on bag;Clean;Skin intact 12/10/22 0818   Catheter Care Provided Yes 12/10/22 0818   Is the red seal intact? Yes 12/10/22 0818   Securement Device? Yes 12/10/22 1200   Free of  dependent loops and below the level of the bladder Y 12/10/22 0818   Reason for Continuing Urinary Catheterization Measurement of strict IO   12/10/22 1200   Signs and Symptoms Denies 12/10/22 0818   Output (mL) 250 mL 12/10/22 1810       Total IV Fluids: 2280m    Urine Output: Urine - total: 500 mL    Specimens: * No specimens in log *    Implant(s): Nothing was implanted during the procedure    Complications: None; patient tolerated the procedure well.    Disposition: ICU - extubated and stable.    Condition: stable    TMelannie Metzner [id number]       Electronically signed by RTed Mcalpine MD at 12/10/2022  8:53 PM EST

## 2022-12-09 NOTE — Nursing Note (Signed)
Formatting of this note might be different from the original.  Report given to Kirby Crigler  Electronically signed by Billey Gosling, RN at 12/09/2022  4:18 PM EST

## 2022-12-09 NOTE — Nursing Note (Signed)
Formatting of this note might be different from the original.  ..Verified scope is clean prior to insertion.  [id number]  Electronically signed by Peyton Bottoms, RN at 12/09/2022  3:14 PM EST

## 2022-12-09 NOTE — ED Notes (Signed)
Formatting of this note might be different from the original.  Multiple physicians have been paged by multiple RN's in regards to patient's critical XR result, no provider has called back at this time    Harless Nakayama, RN  12/09/22 1923    Electronically signed by Harless Nakayama, RN at 12/09/2022  7:23 PM EST

## 2022-12-09 NOTE — Procedures (Signed)
Formatting of this note might be different from the original.  [facility]  Site ID: [facility]  _______________________________________________________________________________  Patient Name: Sue [name]      Gender: Female  Date of Birth: Dec 31, 1960              Age: 62  Procedure Date: 12/09/2022 3:07 PM     MRN: [id number]  Account Number: [id number]           Procedure MD: Loel Dubonnet , , [id number]  _______________________________________________________________________________    Procedure:             Flexible Sigmoidoscopy  Indications:           Abnormal CT of the GI tract, Rectal hemorrhage  _______________________________________________________________________________  IMPRESSION:  - 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 Biopsied.  - Stool in the rectum. interfering with visualization.  FINDINGS:  The perianal and digital rectal examinations were normal.  A severe stenosis was found in the recto-sigmoid colon and was  non-traversed. Biopsies were taken with a cold forceps for histology.  A moderate amount of stool was found in the rectum, interfering with  visualization.  # before the procedure she had imaging done which showed rectal wall  thickening and distal colonic obstruction. Repeat KUB did show diffusely  distended colonic loops consistent with colonic obstruction.  Postprocedure abdomen was distended nontender otherwise hemodynamically  stable. I think her abdominal distention was worsened with CO2  insufflation during the procedure.  RECOMMENDATIONS:  - keep NPO  - given abdominal distention, and bowel obstruction on imaging called  the on-call surgeons they kindly accepted see her and gives  recommendations.  The have called the primary team Dr. Autumn Messing and informed above , surgery  will be consulted and advised insert NG tube.  Patient Profile:       Please see the consult.  Medications:           See the other procedure note for documentation of the  administered  medications  Complications:         No immediate complications.  Estimated Blood Loss:  Estimated blood loss: none.  _______________________________________________________________________________  Procedure Description:  Pre-Anesthesia Assessment:  - Immediately prior to administration of medications, the patient was  re-assessed for adequacy to receive sedatives.  - The heart rate, respiratory rate, oxygen saturations, blood pressure,  adequacy of pulmonary ventilation, and response to care were monitored  throughout the procedure.  - The physical status of the patient was re-assessed after the procedure.  - Prior to the procedure, a History and Physical was performed, and  patient medications and allergies were reviewed. The patient is  competent. The risks and benefits of the procedure and the sedation  options and risks were discussed with the patient. All questions were  answered and informed consent was obtained. Patient identification and  proposed procedure were verified by Physician. See consult note for ROS,  History and Physical, and Past Medications and allergies. in the  pre-procedure area. Mental Status Examination: alert and oriented.  Airway Examination: normal oropharyngeal airway and neck mobility.  Respiratory Examination: clear to auscultation. CV Examination: normal.  Prophylactic Antibiotics: The patient does not require prophylactic  antibiotics. Prior Anticoagulants: The patient has taken no  anticoagulant or antiplatelet agents. ASA Grade Assessment: III - A  patient with severe systemic disease. After reviewing the risks and  benefits, the patient was deemed in satisfactory condition to undergo  the procedure. The anesthesia plan was to use monitored  anesthesia care  (MAC). Immediately prior to administration of medications, the patient  was re-assessed for adequacy to receive sedatives. The heart rate,  respiratory rate, oxygen saturations, blood pressure, adequacy of  pulmonary  ventilation, and response to care were monitored throughout  the procedure. The physical status of the patient was re-assessed after  the procedure.  - Abdominal Examination: abdomen soft and abdomen distended.  - After reviewing the risks and benefits, the patient was deemed in  satisfactory condition to undergo the procedure.  - Monitored anesthesia care under the supervision of an anesthesiologist  was determined to be medically necessary for this procedure based on  review of the patient's medical history, medications, and prior  anesthesia history.  - Sedation was administered by an anesthesia professional.  - This assessment was completed at 15:02 PM, prior to the administration  of sedation.  After obtaining informed consent, the endoscope was passed under direct  vision. Throughout the procedure, the patient's blood pressure, pulse,  and oxygen saturations were monitored continuously. The Endoscope was  introduced through the anus and advanced to the rectum. After obtaining  informed consent, the endoscope was passed under direct vision.  Throughout the procedure, the patient's blood pressure, pulse, and  oxygen saturations were monitored continuously.    _________________  Loel Dubonnet,  12/09/2022 4:13:15 PM  This report has been signed electronically.Mahesh Cheryala  Number of Addenda: 0  Scope In: 3:21:05 PM  Scope Out: 3:34:23 PM  Scope Withdrawal Time  Electronically signed by Loel Dubonnet, MD at 12/09/2022  4:13 PM EST

## 2022-12-09 NOTE — Procedures (Signed)
Formatting of this note might be different from the original.  [facility]  Site ID: [facility]  _______________________________________________________________________________  Patient Name: Sue [name]      Gender: Female  Date of Birth: [birth year]              Age: 62  Procedure Date: 12/09/2022 3:07 PM     MRN: [id number]  Account Number: [id number]           Procedure MD: Loel Dubonnet , , [id number]  _______________________________________________________________________________    Procedure:             Upper GI endoscopy  Indications:           Melena  _______________________________________________________________________________  IMPRESSION:  - Gastritis.  - No specimens collected.  FINDINGS:  The exam of the esophagus was otherwise normal.  Diffuse moderate inflammation characterized by congestion (edema) and  erythema was found in the gastric body and in the gastric antrum.  The exam of the duodenum was otherwise normal.  RECOMMENDATIONS:  - Continue present medications.  - Return patient to hospital ward for ongoing care.  -proceed to flexible sigmoidoscopy.  Patient Profile:       Please see the consult note.  Medications:           See the Anesthesia note for documentation of the  administered medications  Complications:         No immediate complications.  Estimated Blood Loss:  Estimated blood loss: none.  _______________________________________________________________________________  Procedure Description:  Pre-Anesthesia Assessment:  - Prior to the procedure, a History and Physical was performed, and  patient medications and allergies were reviewed. The patient is  competent. The risks and benefits of the procedure and the sedation  options and risks were discussed with the patient. All questions were  answered and informed consent was obtained. Patient identification and  proposed procedure were verified by Physician. See consult note for ROS,  History and Physical, and  Past Medications and allergies. in the  pre-procedure area. Mental Status Examination: alert and oriented.  Airway Examination: normal oropharyngeal airway and neck mobility.  Respiratory Examination: clear to auscultation. CV Examination: normal.  Prophylactic Antibiotics: The patient does not require prophylactic  antibiotics. Prior Anticoagulants: The patient has taken no  anticoagulant or antiplatelet agents. ASA Grade Assessment: III - A  patient with severe systemic disease. After reviewing the risks and  benefits, the patient was deemed in satisfactory condition to undergo  the procedure. The anesthesia plan was to use monitored anesthesia care  (MAC). Immediately prior to administration of medications, the patient  was re-assessed for adequacy to receive sedatives. The heart rate,  respiratory rate, oxygen saturations, blood pressure, adequacy of  pulmonary ventilation, and response to care were monitored throughout  the procedure. The physical status of the patient was re-assessed after  the procedure.  - Abdominal Examination: abdomen soft and abdomen distended.  - After reviewing the risks and benefits, the patient was deemed in  satisfactory condition to undergo the procedure.  - Monitored anesthesia care under the supervision of an anesthesiologist  was determined to be medically necessary for this procedure based on  review of the patient's medical history, medications, and prior  anesthesia history.  - Sedation was administered by an anesthesia professional.  - This assessment was completed at 15:02 PM, prior to the administration  of sedation.  After obtaining informed consent, the endoscope was passed under direct  vision. Throughout the procedure, the patient's blood  pressure, pulse,  and oxygen saturations were monitored continuously. The Endoscope was  introduced through the mouth, and advanced to the second part of  duodenum. The upper GI endoscopy was accomplished without difficulty.  The  patient tolerated the procedure well.    _________________  Loel Dubonnet,  12/09/2022 3:50:43 PM  This report has been signed electronically.Mahesh Cheryala  Number of Addenda: 0  Scope In: 3:15:12 PM  Scope Out: 3:17:16 PM  Scope Withdrawal Time  Electronically signed by Loel Dubonnet, MD at 12/09/2022  3:51 PM EST

## 2022-12-09 NOTE — Procedures (Signed)
Formatting of this note is different from the original.  Procedure Note    Procedure(s):  LAPAROTOMY, EXPLORATORY;  Lysis of adhesions, gastrostomy tube placement, ostomy creation    Indications:   Perforated viscus    Pre-op Diagnosis     * Pneumoperitoneum [K66.8]    Post-op Diagnosis     * Pneumoperitoneum [K66.8]    Surgeon(s):  Ted Mcalpine, MD    Findings   Suspected transverse colon perforation; dense adhesions and tumor burden throughout the abdomen resulting in obstruction    Anesthesia: General    Surgeon(s):  Ted Mcalpine, MD    Staff:   Circulator: Titus Dubin, RN  Relief Circulator: Frazier Richards, RN  Scrub : Hollace Hayward, CST    Estimated Blood Loss: 30 mL    Blood Administered:     Blood Products       None         Drains:   Closed/Suction Drain Right RUQ Bulb 7 Fr. (Active)   Dressing Status Clean;Dry;Intact 12/09/22 2336     NG/OG Tube Nasogastric 18 Fr Left nostril (Active)     Gastrostomy/Enterostomy Gastrostomy 1 18 Fr. LUQ (Active)     Colostomy LLQ (Active)   Stomal Appliance 2 piece 12/09/22 2336   Site Assessment Clean;Intact;Dry 12/09/22 2336     Urethral Catheter Temperature probe 16 Fr. (Active)     Urine Output  Urine - total: 500 mL    Specimens:                * No specimens in log *    Implant(s): Nothing was implanted during the procedure    Complications:  None; patient tolerated the procedure well.    Disposition: ICU - extubated and stable.    Condition: stable    Attending Attestation: I was present and scrubbed for the entire procedure.    [name]  [birth year]    Ted Mcalpine, MD      Electronically signed by Ted Mcalpine, MD at 12/09/2022 11:43 PM EST

## 2022-12-10 NOTE — Progress Notes (Signed)
Formatting of this note might be different from the original.    Spiritual Care Note    Subjective: Ms. Helzer stated that she was diagnosed with colon cancer yesterday. "I have faith in God, and I know where I'm going. But I want more time with my family."     Objective: I reviewed Ms. Fosse' medical record and verified her name and date of birth.    Assessment:  Meaning - Need For Life Balance  The patient describes ways that hospitalization impacts life.: NA  The patient defines changes in overall life balance due to illness and hospitalization.: Ms. Alfred reported that her new diagnosis of colon cancer has been unsettling to her. "I'm so tired. I'm not ready to go yet; I want more time with my family," she stated.  The patient reports challenges in coping with illness and hospitalization.: Ms. Taplin had received a new diagnosis of colon cancer yesterday with her husband, son, and grandson present. "My husband was crying his eyes out," she commented.  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  The patient states that what is happening now changes the relationship to God/or to spirituality.: NA  The patient conveys challenges to religion/spirituality/faith by what is happening now.: Ms. Dildine stated, "I believe in God, and I have faith. But I'm not ready to go yet."  The way the patient lives or expresses faith appears to be disturbed or changed by what is happening now.: NA  (Analysis) To what degree does the Need for Life Connection remain unmet?: some evidence of unmet spiritual need    Values - Need For Values Acknowledgement  The patient assesses how health professionals understand the patient's needs: NA  (Analysis) To what degree does the Need for Values  Acknowledgement remain unmet?: no evidence of unmet spiritual need    Values - Need to Maintain Control  The patient has questions about health problems, goals of care or treatment.: NA  The patient expresses how he or she participates in decisions regarding medical care.: NA  The patient defines the relationship with the doctors and other health professionals.: NA  (Analysis) To what degree does the Need to Maintain Control remain unmet?: no evidence of unmet spiritual need    Psychosocial-Social Identity  The patient reflects upon how worries or difficulties, regarding family or other persons close to the patient, have been addressed by the care team.  : NA  The patient conveys how family members or other persons close to the patient behave in relationship to expectations: Ms. Mullins described her family (including six children and nine grandchildren) as relying heavily on her. "They see me as the strong one."  The patient reveals feelings, especially in being able to express cares and concerns to others.: Ms. Elbert appears torn between speaking candidly with her family about her illness and "putting on a brave face".  The patient communicates how self-image has changed in relationship to illness or hospitalization.: Na  The patient describes connections to faith community.: NA  (Analysis) To what degree does the Need for Values Acknowledgement remain unmet?: substantial evidence of unmet spiritual need    SDAT Score  SDAT Score: 5    Spiritual distress is defined as a score = 5 because this cut-off corresponds  to a range of situations with unmet needs considered as significant either in terms of severity (e.g., one severe unmet spiritual need in one dimension combined with some unmet spiritual need in two other dimensions) or in terms of extent (e.g., unmet spiritual need in all five dimensions).    Intervention: I listened compassionately as Ms. Kazlauskas talked about her dilemma of remaining strong for her family  or preparing them for her death. "They have always seen me as the strong one. If I talk about being sick, they say, 'You'll beat this. You're strong.'" I asked her open-ended questions to encourage her to reflect on and share her thoughts and feelings. I prayed for her.    Plan: I assessed Ms. Luebke as being in spiritual distress (SDAT of 5), so I will place a consult for a follow-up chaplain visit. Chaplains are available to Ms. Watanabe for further spiritual support if she requests it.    Productivity  Time spent on chart review/staff (min): 3 min  Time spent providing spiritual care (min): 26 min  Time spent charting (min): 26 min  Total Productivity: 55 min    Levy Sjogren, Chaplain Resident    Electronically signed by Arville Lime, Chaplain at 12/26/2022 11:07 AM EST

## 2022-12-10 NOTE — Care Plan (Signed)
Formatting of this note might be different from the original.    Problem: PAIN - ADULT  Goal: Verbalizes/displays adequate comfort level or baseline comfort level  Outcome: Progressing  Flowsheets (Taken 12/10/2022 2032)  Verbalizes/Displays Adequate Comfort Level or Baseline Comfort Level:   Encourage patient to monitor pain and request assistance   Assess pain using appropriate pain scale   Administer analgesics based on type and severity of pain and evaluate response   Implement non-pharmacological measures as appropriate and evaluate response   Consider cultural and social influences on pain and pain management   Notify Licensed Independent Practitioner if interventions unsuccessful or patient reports new pain    Problem: SAFETY ADULT - FALL  Goal: Free from fall injury  Outcome: Progressing  Flowsheets (Taken 12/10/2022 2032)  Fall prevention/injury reduction interventions for ALL patients scoring 5 or less on the [place]:   Orient patient/family to surroundings   Place **call bell** and personal items in easy reach   Keep ambulatory assistive devices within easy reach   Assess patient for use of side rails X 2   Educate patient and family on purposeful hourly rounding   Include family in making plan of care for patient   Instruct all ambulatory patients to use skid proof footwear   **Respond promptly to call light   Maintain bed in lowest position with brakes locked    Electronically signed by Jacquenette Shone, RN at 12/10/2022  8:33 PM EST

## 2022-12-10 NOTE — Progress Notes (Signed)
Formatting of this note might be different from the original.  GI - Titus    Pt. went for ex-lap, diverting colostomy after colonic perforation following attempted sigmoidoscopy which was limited to rectum due to obstructing mass/compression.      Pt. looks much better today.   Says she has minimal pain in her belly.     Vitals reviewed.   Appears comfortable.   Abd: soft. Minimally tender near incision.  Some gas in bag.    AAO x 3.     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  s/p perforation transverse colon. Appears to be recovering well following last night's surgery.    Excellent care from surgical team appreciated.    Will follow up path on biopsies taken during flex sig and surgery, but unfortunately, the patient's prognosis remains grave. Further management recs per her oncology team once she recovers from surgery.  Signing off. Call if we can be of any assistance.       Electronically signed by Britt Boozer, MD at 12/10/2022  9:43 AM EST

## 2022-12-10 NOTE — Progress Notes (Signed)
Formatting of this note is different from the original.  Oncology Consult  Consults  Chief Complaint   Patient presents with    Rectal Bleeding    Shortness of Breath     Referring Provider: Kathlene November, MD    Subjective   - Seen and examined this morning, accompanied by her husband.  - Patient underwent colonoscopy yesterday. Subsequently developed abdominal distention and pain, found to have pneumoperitoneum, taken emergently to the OR.    - Exploratory laparotomy with lysis of adhesions, gastrostomy placement, and ostomy creation.    - Suspected transverse colon perforation, dense adhesions, and tumor burden throughout the abdomen.    - NG tube insertion.  - This morning, the patient states she feels a whole lot better. Pain is controlled.  - No new complaints.    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     Following the C5 chemotherapy session on 12/03/22, the patient began experiencing recurrent melena stool, accompanied by abdominal pain. Patient endorses intractable nausea and vomiting.     - CBC revealed stable hemoglobin levels  -  Renal function remained intact.   - FOBT was positive.   - A CT AP 12/17/22 revealed known rectal wall thickening with proximal small and large bowel dilation, suggestive of distal colonic obstruction.   - Colonscopy 12/09/22:  - Laparotomy  12/09/22:      Review of Systems   Constitutional:  Positive for appetite change and fatigue. Negative for unexpected weight change.   HENT: Negative.     Eyes: Negative.    Respiratory:  Positive for chest tightness.    Cardiovascular: Negative.    Gastrointestinal:  Positive for abdominal pain, blood in  stool, nausea and vomiting.   Endocrine: Negative.    Genitourinary: Negative.    Musculoskeletal: Negative.    Skin: Negative.    Allergic/Immunologic: Negative.    Neurological: Negative.    Hematological: Negative.  Negative for adenopathy.   Psychiatric/Behavioral: Negative.       Allergy(ies):  Penicillins, Hydrocodone, Orange fruit [citrus], Tomato, Demerol hcl [meperidine], Ibuprofen, Morphine, Oxycodone, Strawberry, and Tylenol [acetaminophen]    Medications:  Scheduled:  acetaminophen, 1,000 mg, Intravenous, Q8 Hrs  artificial tears, 1 drop, Both Eyes, 4x daily  cefepime, 1 g, Intravenous, Q6 Hrs  lidocaine, 1 patch, Transdermal, Daily  metronidazole, 500 mg, Intravenous, Q8 Hrs  pantoprazole, 40 mg, Intravenous, Q12 Hrs  polyethylene glycol, 17 g, Oral, Daily  prednisoLONE acetate, 1 drop, Both Eyes, 4x daily  pregabalin, 200 mg, Oral, BID    Infusions:  dextrose 5 % and lactated Ringer's, 100 mL/hr, Last Rate: 100 mL/hr (12/10/22 1615)    PRN:    Alum & Mag Hydroxide-Simeth    HYDROmorphone    HYDROmorphone    naloxone    ondansetron    prochlorperazine    Past Medical History:   Diagnosis Date    Colon cancer (CMS/HCC)     Hepatitis C infection     treated 2015    Hypertension     Ovarian cancer (CMS/HCC)     PAD (peripheral artery disease) (CMS/HCC)      Past Surgical History:   Procedure Laterality Date    COLON SURGERY  OVARY SURGERY       Family History:  family history is not on file.    Social History:   reports that she has never smoked. She has never used smokeless tobacco. She reports that she does not currently use alcohol. She reports current drug use.    Physical Exam  Constitutional:       Appearance: She is ill-appearing.   HENT:      Head: Normocephalic.      Nose: Nose normal.      Mouth/Throat:      Mouth: Mucous membranes are moist.      Pharynx: Oropharynx is clear.   Eyes:      Extraocular Movements: Extraocular movements intact.      Conjunctiva/sclera: Conjunctivae normal.       Pupils: Pupils are equal, round, and reactive to light.   Cardiovascular:      Rate and Rhythm: Normal rate and regular rhythm.      Pulses: Normal pulses.      Heart sounds: Normal heart sounds.   Pulmonary:      Effort: Pulmonary effort is normal.      Breath sounds: Normal breath sounds.   Abdominal:      General: Abdomen is flat. Bowel sounds are normal.      Palpations: Abdomen is soft.      Tenderness: There is abdominal tenderness.   Musculoskeletal:         General: Normal range of motion.      Cervical back: Normal range of motion and neck supple.   Skin:     General: Skin is warm.      Capillary Refill: Capillary refill takes less than 2 seconds.   Neurological:      General: No focal deficit present.      Mental Status: She is alert.     Labs:  Lab Results       ID Description Status Collection Date/Time    [id number] Lactic Acid, Venous (Abnormal) Final result 12/09/22 1959      Result Value Flag Comment    Lactic Acid Venous 2.9 High           [id number] CBC with Differential  Canceled --    [id number] Renal Function Panel (Next AM)  Canceled --    [id number] CBC and Differential  Canceled --    [id number] CBC with Differential (Abnormal) Final result 12/10/22 0418    [id number] Renal Function Panel (Next AM) (Abnormal) Final result 12/10/22 0418      Result Value Flag Comment    Sodium Level 135      Potassium Level 4.3      Chloride 101      Carbon Dioxide/CO2 22      Anion Gap 12      Glucose Random 138 High     Calcium Level Total 8.8      Blood Urea Nitrogen 12      Creatinine 0.58      BUN/Creatinine ratio 20.7 High     Albumin Level 3.2 Low     Phosphorus Level 3.0      eGlomerular Filtration Rate African American >60  Interpretive Data:  Estimated glomerular filtration rate to be used for African American patients based on the MDRD (Modification of Diet in Renal Disease) calculation. Please refer to pharmacy personnel for calculation of a modified Cockcroft-Gault estimated CrCl used in drug dosing  protocols.          [id number] CBC and  Differential (Abnormal) Final result 12/10/22 0418      Result Value Flag Comment    White Blood Cell 3.7 Low     Red Blood Cell 3.33 Low     Hemoglobin 11.6 Low     Hematocrit 33.7 Low     Mean Cell Volume 101.4 High     Mean Cell Hemoglobin 35.0 High     Mean Cell Hemoglobin Concentration 34.5      Red Cell Distribution Width 15.2 High     Platelet Count 127 Low     Mean Platelet Volume 7.3 Low     Neutrophils Automated Absolute Number 3.0      Lymphocytes Automated Absolute Number 0.3 Low     Monocytes Automated Absolute Number 0.4      Eosinophils Automated Absolute Number 0.0 Low     Basophils Automated Absolute Number 0.0      Neutrophils Automated Percentage 81.3      Lymphocytes Automated Percentage 6.8      Monocytes Automated Percentage 11.8      Eosinophils Automated Percentage 0.0      Basophils Automated Percentage 0.1                   Lab results have been reviewed by myself as of December 10, 2022    Imaging:   Imaging (Last 24 HRS)       ** No results found for the last 24 hours. **         Radiology results have been reviewed by myself as of December 10, 2022    Vitals Signs:  Patient Vitals for the past 24 hrs:   BP Temp Temp src Pulse Resp SpO2 Weight   12/10/22 1811 148/78 (!) 99.4 F (37.4 C) Oral 75 18 98 % --   12/10/22 1512 131/69 98.2 F (36.8 C) Oral 68 17 96 % --   12/10/22 1428 131/69 98.2 F (36.8 C) Oral 68 17 97 % --   12/10/22 1400 127/68 98.6 F (37 C) -- 74 (!) 12 97 % --   12/10/22 1321 -- (!) 99 F (37.2 C) -- 89 17 98 % --   12/10/22 1300 (!) 167/97 98.8 F (37.1 C) -- 91 (!) 22 96 % --   12/10/22 1200 145/68 98.6 F (37 C) -- 73 15 95 % --   12/10/22 1100 138/60 98.4 F (36.9 C) -- 69 (!) 11 96 % --   12/10/22 1013 -- 98.1 F (36.7 C) -- 65 (!) 11 96 % --   12/10/22 1000 124/85 98.1 F (36.7 C) -- 66 15 96 % --   12/10/22 0913 -- 98.4 F (36.9 C) -- 77 20 96 % --   12/10/22 0900 149/69 98.2 F (36.8 C) -- 77 15 96 % --   12/10/22  0818 -- 97.9 F (36.6 C) -- 74 17 100 % --   12/10/22 0800 126/65 98.1 F (36.7 C) Foley 64 (!) 10 100 % --   12/10/22 0700 130/65 98.2 F (36.8 C) -- 66 (!) 11 100 % --   12/10/22 0600 110/63 98.1 F (36.7 C) -- 65 (!) 8 100 % 115 lb 8.3 oz (52.4 kg)   12/10/22 0500 (!) 135/92 98.4 F (36.9 C) -- 82 19 99 % --   12/10/22 0400 142/76 98.4 F (36.9 C) Foley 69 (!) 11 100 % --   12/10/22 0300 149/77 98.2 F (36.8 C) -- 71 19 100 % --   12/10/22  0200 125/71 98.4 F (36.9 C) -- 69 (!) 13 100 % --   12/10/22 0130 130/72 98.4 F (36.9 C) -- 75 15 100 % --   12/10/22 0100 135/70 98.6 F (37 C) -- 79 (!) 8 100 % --   12/10/22 0045 178/83 98.6 F (37 C) -- 87 (!) 11 100 % --   12/10/22 0037 -- -- -- -- -- -- 115 lb 8.3 oz (52.4 kg)   12/10/22 0035 167/84 98.4 F (36.9 C) Foley (!) 93 (!) 14 100 % --   12/10/22 0015 158/79 -- -- 84 (!) 12 99 % --   12/10/22 0010 158/74 -- -- 81 (!) 14 98 % --   12/10/22 0005 150/74 -- -- 83 (!) 9 92 % --   12/10/22 0002 155/77 -- -- 89 (!) 11 93 % --   12/10/22 0000 155/77 -- -- (!) 92 15 95 % --   12/09/22 2355 153/79 -- -- (!) 92 16 96 % --   12/09/22 2350 150/70 -- -- 90 15 96 % --   12/09/22 2345 148/78 -- -- (!) 92 15 96 % --   12/09/22 2340 (!) 149/94 -- -- (!) 93 15 96 % --   12/09/22 2337 146/73 -- -- (!) 94 15 96 % --   12/09/22 2335 133/75 98.3 F (36.8 C) Tympanic (!) 94 15 98 % --     Intake/Output:    Intake/Output Summary (Last 24 hours) at 12/10/2022 1926  Last data filed at 12/10/2022 1810  Gross per 24 hour   Intake 4716.67 ml   Output 1765 ml   Net 2951.67 ml     Assessment/Plan     Principal Problem:    Acute GI bleeding  Active Problems:    Chronic hepatitis C virus infection (CMS/HCC)    HTN (hypertension)    Vascular problem    Pneumoperitoneum    Concern for acute GI bleed  Colonic obstruction due to suspected rectal mass  Pneumoperitoneum  Stage 3 colorectal adenocarcinoma, right hemicolectomy, and terminal ileocectomy on 09/08/14; no adjuvant chemotherapy  due to recurrent hospitalization  Stage IV high-grade ovarian cancer on Mirvetuximab soravtansin  Cancer related pain   Nausea and vomiting     Recommendation:  Current hemoglobin stable. EGD showed gastritis. Colonoscopy on 12/10/22 revealed severe stenosis in the recto-sigmoid colon, non-traversable. Biopsy pending.  Emergency exploratory laparotomy indicated a possible transverse colon perforation, dense adhesions, and tumor burden throughout the abdomen causing obstruction.    - Patient underwent gastrostomy tube placement and ostomy creation.    - Biopsies are pending.  - Mirvetuximab soravtansin can cause abdominal pain, and very rarely, bowel perforation. However, the current episode seemed to have been triggered by possible barotrauma.    3. Patient has a history of colorectal cancer, status post hemicolectomy, and terminal ileocectomy. Did not receive adjuvant chemotherapy. Reports surveillance colonoscopy with normal results, but evidence not found per Care Everywhere. 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 Metachronous colorectal cancer conceivable, biopsy pending.    4. Will discuss ovarian cancer-directed therapy resumption after adequate clinical and postoperative recovery.    5. The rest of management per primary team.    Dr. Cristina Gong      Electronically signed by Doreatha Lew, MD at 12/10/2022  7:42 PM EST

## 2022-12-10 NOTE — Nursing Note (Signed)
Formatting of this note might be different from the original.  Pt signed out by Dr. Jeneen Rinks, [place] charge  Electronically signed by Belenda Cruise, RN at 12/10/2022 12:21 AM EST

## 2022-12-10 NOTE — Procedures (Signed)
Formatting of this note is different from the original.  Images from the original note were not included.  Inpatient Ostomy Services Note    Reason for consult: "new colostomy"    Subjective: Patient is awake and agreeable with beginning ostomy teaching.     Past Medical History:   Diagnosis Date    Colon cancer (CMS/HCC)     Hepatitis C infection     treated 2015    Hypertension     Ovarian cancer (CMS/HCC)     PAD (peripheral artery disease) (CMS/HCC)      Past Surgical History:   Procedure Laterality Date    COLON SURGERY      OVARY SURGERY       Objective: 62 y.o. admitted on 12/07/22 for acute GI bleeding. Patient presents on a Progressa bed. Patient has a NG tube in place.     Braden Scale Score: 16    Assessment:    Location: LUQ loop colostomy  Stoma: red, moist, red rubber catheter bridge in place.   Mucocutaneous junction: sutured  Peristomal skin: intact  Effluent: scant amount of red "bowel sweat"  Pouching supplies: small piece of Brava ring in dip at 5 o'clock, Stomahesive around colostomy and red rubber catheter, 2 3/4" Hollister flat wafer (#48250) and drainable ostomy pouch (#18004).     Pt aware of medical problems and has appropriate questions about the ostomy.  Pt given appropriate handouts related to specific ostomy type  Reviewed both pamphlets in depth with patient  Pt given ostomy pre-surgery care kit.  Verbal and visual introduction to the wafer and pouching system    Discussed the following:  Colostomy education was limited. Discussed the creation of a colostomy. Verbal instructions were given as colostomy appliance was changed. Patient states that she is unable to look at the stoma at this time. Patient also states that she is will the affects of the Morphine that she was given.     Plan:  Patient to continue to review education as able and familiarize self with the wafer and pouch.  Ostomy team will tentatively return on Friday for additional teaching.     Time spent with patient:  [id number]    Electronically signed by Flonnie Overman, RN at 12/10/2022  2:38 PM EST

## 2022-12-10 NOTE — Progress Notes (Signed)
Formatting of this note is different from the original.  Images from the original note were not included.  CRITICAL CARE PROGRESS NOTE    Interval History: In to see patient. States pain comes and goes. Discussed overall POC, concern given her significant systemic illness that recovery will be difficult. Says Dr. Cristina Gong saw her this morning and was "coming up with a plan". Offered palliative care services, agreeable to speaking with them. She became understandably tearful, and voiced that she was "scared". Comfort provided. Requested chaplain support, nursing to call. Okay to transfer to general surgery floor. No family at bedside.    Assessment/Plan     Neurologic:   1. Acute on chronic pain   -home regimen: norco, lyrica   -was not taking methadone, has never filled pta - d/c   -cont scheduled IV tylenol, resume home lyrica   -cont prn dilaudid   -resume PO norco (home med) when able to take     Cardiovascular:   1. Hx of HTN   -hold home losartan given labile BP and immediate post-op    Respiratory:   1. Currently on room air. Needs aggressive pulmonary toileting.    GI/Nutrition:   1. Pneumoperitoneum   -likely 2/2 transverse colon perforation following flex sig   -s/p exploratory laparotomy With lysis of adhesions, g-tube placement, and colostomy creation 12/09/22 (Dr. Rondel Jumbo)   -d/c NGT   -place g tube to gravity, clamp after medication administration    -NPO x meds and some ice chips    -add dextrose to IVF   -add miralax for bowel regimen    -ostomy with some gas- monitor for ROBF   -monitor JP drain output    -WOCN consult for new ostomy    2. Colon cancer   -follows with Dr. Cristina Gong outpatient   -now with concern for malignant obstruction   -palliative care consulted    Genitourinary:   1. Hx of stage IV ovarian cancer   -currently on chemotherapy   -follows with Dr. Cristina Gong outpatient   -supportive care    2. Adequate urine output. Cont IVF resuscitation. Discontinue foley in AM and follow bladder emptying  protocol.    Heme:   1. Macrocytic anemia - mild   -appears chronic in setting of expected operative losses   -folate, B12 wnl   -no role for transfusion at this time    -monitor     2. Thrombocytopenia - mild    -likely 2/2 #1 as well as malignancy   -monitor     Infectious Diseases:   1. Leukopenia   -appears chronic, actively on chemotherapy    -without fevers however on scheduled tylenol   -given perforated viscus - cont cefepime and flagyl x 5 days (stop date 1/21)   -monitor     Endocrine:   1. No acute issues at this time. Maintain euglycemia.     Musculoskeletal:   1. Deconditioning   -will benefit from PT eval, ordered     Nursing/Prophylaxis:   DVT ppx- SCD's, lovenox to begin tomorrow   GI ppx- protonix    Critical Care Time, not including any separately billable procedures or family discussions (in minutes): 59      HPI: Sue [name] is a 62 y.o. 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 CT scan of the abdomen with rectosigmoid colon wall thickening and proximal small and large bowel dilatation concerning for colonic obstruction vs neoplasm. Patient was admitted to the hospitalist service with consults to GI and hematology-oncology. Patient underwent EGD and flex sigmoidoscopy by GI on 12/09/22. Post-procedurally, patient had worsening abdominal distension and a repeat KUB revealed pneumoperitoneum. General surgery was consulted and patient underwent exploratory laparotomy with lysis of adhesions, g-tube placement, and colostomy creation. Intraoperatively, patient found to have dense adhesions and tumor burden throughout the abdomen resulting in obstruction and had a suspected  transverse colon perforation. Post-operatively patient was transferred into the care of the general surgery service and sent to the SICU for further evaluation and management.    Physical Exam  Vitals and nursing note reviewed.   Constitutional:       General: She is awake. She is not in acute distress.     Appearance: She is underweight. She is ill-appearing.      Comments: Resting in bed with silk turban on.    HENT:      Head: Normocephalic and atraumatic.      Nose:      Comments: NGT with small amount of brown output.   Eyes:      Pupils: Pupils are equal, round, and reactive to light.   Cardiovascular:      Rate and Rhythm: Normal rate and regular rhythm.      Pulses: Normal pulses.   Pulmonary:      Effort: Pulmonary effort is normal. No tachypnea, accessory muscle usage or respiratory distress.      Breath sounds: Normal breath sounds.      Comments: Room air.   Abdominal:      General: Abdomen is flat. The ostomy site is clean.      Tenderness: There is generalized abdominal tenderness.      Comments: Abdomen taut but soft. Midline abdominal incision. JP drain with serosanguinous output. Ostomy appliance intact with pink stoma.   Genitourinary:     Comments: Foley with light amber urine.   Musculoskeletal:      Right lower leg: No edema.      Left lower leg: No edema.   Skin:     General: Skin is warm and dry.      Capillary Refill: Capillary refill takes less than 2 seconds.   Neurological:      Mental Status: She is alert and oriented to person, place, and time.      GCS: GCS eye subscore is 4. GCS verbal subscore is 5. GCS motor subscore is 6.   Psychiatric:         Mood and Affect: Affect is tearful.         Speech: Speech normal.         Behavior: Behavior normal. Behavior is cooperative.         Thought Content: Thought content normal.         Cognition and Memory: Cognition normal.         Judgment: Judgment normal.     Last IPOC Decisions      Neuro   Pain Adequately Controlled?: Yes   Delirium   Addressed?: Yes   Respiratory/Vent Related   Is On Ventilator?: No   Restraint Order Updated?: N/A   Renal   Fluids & Electrolytes Addressed?: Yes       Nutrition and Endocrine  Diet/Nutrition Adequate?: No   Loose Stools?: No   SLP Evaluation?: No   Glucose Management?: Yes   ID   Antibiotics/Cultures Discussed?: Yes   Antibiotic List: Cefepime 1g q6hrs, Flagyl '500mg'$  q8hrs   Prophylaxis   Stress Ulcer Prophylaxis?: Yes   VTE Prophylaxis Plan: Mechanical (SCD's)   Skin Breakdown / Wound Plan: Turn and reposition   Lines and Drains   Central Line: No   Foley Catheter: Yes, continue   Foley - Reason to Maintain: Accurate Measurement output   Physical Therapy   PT  Evaluation?: No   Mobilize Patient?: Yes   Social   DCP, family needs, and social issues addressed?: Yes   Organ Procurement Triggered?: No   Disposition   Meets transfer criteria per unit protocol?: Yes   Barriers to transfer?:      Level of Care Required?: Gen Surg Telemetry   _____________________                 Anice Paganini Details         User Time      Last Recorded by Torrie Mayers, RN 12/10/2022  9:37 AM              DATA REVIEWED ON ROUNDS:   LOS: 2 days     PROBLEM LIST:  Principal Problem:    Acute GI bleeding  Active Problems:    Chronic hepatitis C virus infection (CMS/HCC)    HTN (hypertension)    Vascular problem    Pneumoperitoneum    Code Status: Full Code    NEURO:  RASS: 0  RASS: Alert and calm (12/10/22 1200)  Overall CAM-ICU: Negative (12/10/22 1200)        CARDIOVASCULAR:  Cardiac Rhythm: Normal sinus rhythm (12/10/22 0800)    LINES       Active Lines       Name Placement date Placement time Site Days Last dressing change    Single Lumen Implantable Port 05/24/22 Chest 05/24/22  --  Chest  200 12/10/22 0400 (10.07 hrs)    Peripheral IV 12/09/22 Left Antecubital 12/09/22  2040  -- less than 1 12/10/22 0400 (10.07 hrs)    Peripheral IV 12/09/22 Right Forearm 12/09/22  2104  -- less than 1 12/10/22 0400 (10.07 hrs)               Active  VTE Medication Orders       None         Active VTE Mechanical Prophylaxis Orders        Start     Ordered    12/10/22 0037  Apply and maintain sequential compression device  Until SCDs Discontinued       12/10/22 0036    12/10/22 0032  Place sequential compression device  Until SCDs Discontinued       12/10/22 0031    12/08/22 0740  Place sequential compression device  Until SCDs Discontinued       12/08/22 0739               Active VTE Prophylaxis Contraindication Orders        Start     Ordered    12/10/22 0032  Pharmacological VT Prophylaxis Not Indicated/Contraindicated  Once        Question:  Reason:  Answer:  Due to risk of bleeding    12/10/22 0031    12/08/22 0740  Pharmacological VT Prophylaxis Not Indicated/Contraindicated  Once  Question:  Reason:  Answer:  Due to risk of bleeding    12/08/22 0739               RLE DVT Prophylaxis: Sequential compression device (12/10/22 0037)  LLE DVT Prophylaxis: Sequential compression device (12/10/22 0037)                RESPIRATORY:  AIRWAYS       Active Airway       None               O2 Sat: 97%  O2 Delivery Method: Nasal cannula (12/10/22 0400)              ABG   Most recent results from last  72 hours   Lab Units 12/09/22  1959 12/08/22  0009   LACTATE mmol/L 2.9* 0.6     Results for orders placed during the hospital encounter of 12/07/22    XR ABDOMEN 1 VW 12/09/2022 (Final)    Status: Abnormal    Impression  1. Interval development of frank pneumoperitoneum.  2. Recommend retracting right IJ catheter 6 cm.    The results regarding pneumoperitoneum were communicated to the emergency department charge nurse Wyline Copas ( on December 09, 2022 at 6:22 p.m..    +++++++++++++++++++++++++++++++++++++++++++++++++++++++++++++++++++  +++++CLASS CODE+++++: C-critical value +++++ CRITICAL VALUE +++++  +++++++++++++++++++++++++++++++++++++++++++++++++++++++++++++++++++    INTERPRETING PHYSICIAN: Lyndon Code, MD  VWC//Report ID: [id number]    Creation Date:  12/09/22 6:43 pm    XR ABDOMEN 1 VW 12/09/2022 (Final)    Status: Abnormal    Impression  1. Nasoenteric tube in adequate position.  2. Persistent gross pneumoperitoneum.  3. Distended large bowel loops which may represent ileus or obstruction.    ++++++++++++++++++++++++++++++++++++++++++++++++++++++++++++++++++  +++++CLASS CODE+++++:  P-positive findings +++++ POSITIVE +++++  ++++++++++++++++++++++++++++++++++++++++++++++++++++++++++++++++++    INTERPRETING PHYSICIAN: Lyndon Code, MD  VWC//Report ID: [id number]    Creation Date: 12/09/22 6:44 pm    Daily Weaning Trial  Respiratory Depth/Rhythm: Shallow (12/10/22 0400)  Respiratory Effort: Unlabored (12/10/22 0800)    IHI Ventilator Associated Pneumonia Bundle  Head of Bed Elevated : Self regulated (12/10/22 0800)  Oral Care: Mouth swabbed (12/10/22 0800)     GI/NUTRITION:  Adult NPO Diet NPO Except: Medications with sips of water, Ice chips      Dietary Orders   (From admission, onward)             Start     Ordered    12/10/22 1320  Adult NPO Diet NPO Except: Medications with sips of water, Ice chips  Diet effective now        Question Answer Comment   NPO Except: Medications with sips of water    NPO Except: Ice chips    Reason for NPO order Post-Op    To Order: NPO       12/10/22 1319    12/08/22 0740  Nutrition Adjustment per Dietitian  Once         12/08/22 0739               Active GI Prophylaxis Orders        Start     Ordered    12/08/22 1200  pantoprazole (PROTONIX) injection 40 mg  Every 12 hours        Question:  Indication of Use:  Answer:  Upper GI bleed    12/08/22 0739  DRAINS, CATHETERS & TUBES       Active Drain / Urethral Catheter       Name Placement date Placement time Site Days    Closed/Suction Drain Right RUQ Bulb 7 Fr. 12/09/22  2249  RUQ  less than 1    Gastrostomy/Enterostomy Gastrostomy 1 18 Fr. LUQ 12/09/22  2234  LUQ  less than 1    Colostomy LLQ 12/09/22  2301  LLQ  less than 1    Urethral Catheter Temperature  probe 16 Fr. 12/09/22  --  -- 1                 GENITOURINARY:  Continuous Infusions       Continuous       Medication Ordered Dose/Rate, Route, Frequency Last Action    lactated Ringer's infusion 100 mL/hr, IV, Continuous New Bag, 100 mL/hr at 01/17 1323               I/O         01/16 0700  01/17 0659 01/17 0700  01/18 0659    P.O. 0 0    I.V. (mL/kg) 3028.3 (57.8) 738.3 (14.1)    NG/GT 0 20    IV Piggyback 1230 200    Total Intake(mL/kg) 4258.3 (81.3) 958.3 (18.3)    Urine (mL/kg/hr) 955 (0.8) 375 (1)    Emesis/NG output 0 50    Drains 80     Stool 0 0    Blood 30     Total Output 1065 425    Net +3193.3 +533.3      Unmeasured Urine Occurrence 0 x     Unmeasured Stool Occurrence 0 x 0 x    Unmeasured Emesis Occurrence 0 x          ENDOCRINE:   Glucose Results - Last 24 Hours         10/14/22 10/20/22 11/11/22 12/02/22 12/07/22 12/07/22 12/09/22 12/10/22     0922 1533 0921 0842 2058 2059 0055 0418    Glucose 88 84 91 97 70 77 72 138         Insulin administrations (last 24 hours)       None         SKIN:  Braden Scale Score: 17 (12/10/22 0800)       Enterostomal Therapy Consults   (From admission, onward)             Start     Ordered    12/10/22 0032  Wound Ostomy Continence Nurse Eval and Treat  Once        Question:  Reason for Consult?  Answer:  new colostomy    12/10/22 0031               Wound LDA's       Active Wound LDA's       Name Placement date Placement time Site Days Last dressing change    Wound 12/09/22 Incision Abdomen Mid;Anterior 12/09/22  --  Abdomen  1 12/09/22 2337 (14.46 hrs)               INFECTION CONTROL:  Temp: 98.6 F (37 C) (12/10/22 1400)    Isolation Status: No active isolations    Lab Results   Component Value Date    WBC 3.7 (L) 12/10/2022     Active Antibiotic Orders        Start     Ordered    12/10/22 0100  metroNIDAZOLE (FLAGYL) IVPB 500  mg  Every 8 hours         12/10/22 0031    12/08/22 1845  cefepime (MAXIPIME) 1 g in sterile water (PF) syringe  Every 6 hours          12/08/22 1844    12/08/22 1509  meropenem (MERREM) 500 MG injection  - ADS Override Pull        Note to Pharmacy: Created by cabinet override    12/08/22 1509               REHAB:    Physical Therapy Assessment       None         Fall Risk: Total Fall Risk Score (Sum of all points): 14 (12/10/22 0904)    CARE MANAGEMENT:  Discharge Planning      [place] Most Recent Value   Patient wishes to be discharged to --  [Ex-lap, diverting colostomy after colonic perforation from [place]. Hx of Ovarian CA and Colon CA undergoing Chemotherapy. Lives with husband and brother in law in 1 story home with 3 steps to enter. Has meds filled at [facility] in [place]. No equipment.] on 12/10/2022 1318   Referral made to [facility] per patient's choice] on 12/10/2022 1318   Is Discharge Anticipated in 48 Hours?  No on 12/10/2022 1318   Discharge Plan  Home health, Home, new meds on 12/10/2022 1323   Barriers to discharge Medically Complex  [GI Bleed, New Colostomy, CA metastaic] on 12/10/2022 1323         Vital Signs Last 24h Min/Max     Temp 98.6 F (37 C) (12/10/22 1400) Temp  Min: 97.9 F (36.6 C)  Max: 99 F (37.2 C)   HR 74 (12/10/22 1400) Pulse  Min: 64  Max: 101   RR (!) 12 (12/10/22 1400) Resp  Min: 8  Max: 28   A-line BP   No data recorded   BP 127/68 (12/10/22 1400) BP  Min: 94/65  Max: 178/83   SpO2 97 % (12/10/22 1400) SpO2  Min: 92 %  Max: 100 %   FiO2  (per CRNA) (12/09/22 1508) No data recorded     SELECT LABS:  CBC   Most recent results from last  72 hours   Lab Units 12/10/22  0418 12/09/22  0055 12/08/22  1957 12/08/22  1621 12/08/22  1348 12/08/22  0929 12/07/22  2059 12/07/22  2058   WBC k/mcL 3.7* 4.5*  --   --   --   --   --  7.5   RBC Mil/mcL 3.33* 3.60*  --   --   --   --   --  3.72*   HEMOGLOBIN gm/dL 11.6* 12.4 13.2 12.9 12.9 12.8  --  12.8   HEMOGLOBIN POC g/dL  --   --   --   --   --   --  13.3  --    HEMATOCRIT % 33.7* 36.3* 39.9 37.4 37.7 37.2  --  37.6   HEMATOCRIT POC %  --   --   --   --    --   --  39.0  --    PLATELETS k/mcL 127* 162  --   --   --   --   --  189     CMP   Most recent results from last  72 hours   Lab Units 12/10/22  0418 12/09/22  0055 12/07/22  2059 12/07/22  2058  SODIUM mmol/L 135 138 138 139   POTASSIUM mmol/L 4.3 3.7 4.1 4.2   CHLORIDE mmol/L 101 100 102 104   CO2 mmol/L 22 20* 23 21*   ANION GAP mmol/L 12 18*  --  14   BUN/CREAT RATIO  20.7* 24.6*  --  26.5*   BUN mg/dL '12 15 22 22   '$ CREATININE mg/dL 0.58 0.61 0.80 0.83   GLUCOSE mg/dL 138* 72 77 70   CALCIUM mg/dL 8.8 9.1  --  9.6   TOTAL PROTEIN gm/dL  --  6.8  --  7.3   ALBUMIN gm/dL 3.2* 3.7  --  4.1   GLOBULIN, TOTAL gm/dL  --  3.1  --   --    A/G RATIO   --  1.2  --   --    ALK PHOS U/L  --  58  --  59   AST U/L  --  17  --  22   ALT U/L  --  9  --  7   BILIRUBIN TOTAL mg/dL  --  0.6  --  0.6   MAGNESIUM mg/dL  --  1.6*  --  1.6*     Cardiac Enzymes No results of this type in the last 72 hours    BNP   Most recent results from last  72 hours   Lab Units 12/08/22  0003   BNP pg/mL 96.0     Coags   Most recent results from last  72 hours   Lab Units 12/08/22  0929   PRO TIME sec 10.4   INR RATIO 0.91     Hepatic Function   Most recent results from last  72 hours   Lab Units 12/10/22  0418 12/09/22  0055 12/07/22  2058   ALBUMIN gm/dL 3.2* 3.7 4.1   TOTAL PROTEIN gm/dL  --  6.8 7.3   BILIRUBIN DIRECT mg/dL  --   --  0.2   BILIRUBIN TOTAL mg/dL  --  0.6 0.6   ALT U/L  --  9 7   AST U/L  --  17 22   ALK PHOS U/L  --  58 59   LIPASE U/L  --   --  5*           Electronically signed by Lyn Records, MD at 12/11/2022  2:53 PM EST    Associated attestation - Lyn Records, MD - 12/11/2022  2:53 PM EST  Formatting of this note might be different from the original.  I saw and evaluated the patient, participating in the key portions of the service.  I collaborated with the Secretary/administrator in reviewing the findings and developing the plan of care.

## 2022-12-10 NOTE — Care Plan (Signed)
Formatting of this note might be different from the original.    Problem: PAIN - ADULT  Goal: Verbalizes/displays adequate comfort level or baseline comfort level  Outcome: Progressing  Flowsheets (Taken 12/10/2022 0903)  Verbalizes/Displays Adequate Comfort Level or Baseline Comfort Level:   Encourage patient to monitor pain and request assistance   Assess pain using appropriate pain scale   Administer analgesics based on type and severity of pain and evaluate response    Problem: INFECTION - ADULT  Goal: Absence of infection during hospitalization  Outcome: Progressing  Goal: Absence of fever/infection during anticipated neutropenic period  Outcome: Progressing    Problem: SAFETY ADULT - FALL  Goal: Free from fall injury  Outcome: Progressing  Flowsheets (Taken 12/10/2022 0903)  Fall prevention/injury reduction interventions for ALL patients scoring 5 or less on the [employer] Scale:   **Respond promptly to call light   Place **call bell** and personal items in easy reach  Fall risk interventions for patients scoring 6 and above on the [employer] Scale:   **Place YELLOW armband on patient's wrist   **Place YELLOW socks on patient to decrease slippage and increase fall risk awareness   Place YELLOW magnet/signage/lights outside patient's room    Problem: DISCHARGE PLANNING  Goal: Discharge to home or other facility with appropriate resources  Outcome: Progressing    Problem: Knowledge Deficit  Goal: Patient/family/caregiver demonstrates understanding of disease process, treatment plan, medications, and discharge instructions  Outcome: Progressing    Problem: Potential for Compromised Skin Integrity  Goal: Skin Integrity is Maintained or Improved  Outcome: Progressing  Flowsheets (Taken 12/10/2022 0903)  Skin Integrity is Maintained or Improved:   Identify patients at risk for skin breakdown on admission and per policy   Relieve pressure to bony prominences   Keep skin clean and dry  Goal: Nutritional status is  improving  Outcome: Progressing    Problem: Urinary Incontinence  Goal: Perineal skin integrity is maintained or improved  Outcome: Progressing    Electronically signed by Burnett Kanaris, RN at 12/10/2022  9:04 AM EST

## 2022-12-10 NOTE — Care Plan (Signed)
Formatting of this note might be different from the original.  Goals: maintain safety, skin integrity, and hemodynamic stability.       Identify possible barriers to meeting goals/advancing plan of care:     Stability of the patient: Moderately Stable - Low risk of patient condition declining or worsening    End of Shift Summary: will continue to monitor  Problem: PAIN - ADULT  Goal: Verbalizes/displays adequate comfort level or baseline comfort level  Outcome: Progressing    Problem: INFECTION - ADULT  Goal: Absence of infection during hospitalization  Outcome: Progressing  Goal: Absence of fever/infection during anticipated neutropenic period  Outcome: Progressing    Problem: SAFETY ADULT - FALL  Goal: Free from fall injury  Outcome: Progressing    Problem: DISCHARGE PLANNING  Goal: Discharge to home or other facility with appropriate resources  Outcome: Progressing      Electronically signed by Dirk Dress, RN at 12/10/2022 12:27 AM EST

## 2022-12-10 NOTE — Progress Notes (Signed)
Formatting of this note might be different from the original.  Care Coordinator Discharge Plan     Discharge Plan    Discharge Planning  Patient wishes to be discharged to:  (Ex-lap, diverting colostomy after colonic perforation from [place]. Hx of Ovarian CA and Colon CA undergoing Chemotherapy. Lives with husband and brother in law in 1 story home with 3 steps to enter. Has meds filled at [facility] in [place]. No equipment.)  Referral made to: DC Planning [facility] per patient's choice)  Is Discharge Anticipated in 48 Hours? : No  Discharge Plan : Home health, Home, new meds  Barriers to discharge: Medically Complex (GI Bleed, New Colostomy, CA metastaic)  Primary Caregiver is willing and able to perform any and all duties?: Yes  Who will patient reside with? : Spouse/significant other  Support Systems: Spouse/significant other, Family members  DME equipment needs: No equipment needed  Services needing prior authorization: Home Health, Medications  Freedom of Choice Was Discussed: Yes  Written Resource List Provided : Patient Declined  Choice of providers: Yes  Patient/Family/POA directed to Medicare Compare site for review of post-acute facility/agency quality data : Yes  Case Management assessment reviewed with the patient or primary caregiver?: Yes (Reviewed with patient who is alert and oriented)  How ready does the patient/family feel they are prepared for discharge?: Not prepared  Does the patient need discharge transport arranged?: No (Husband will transport home)  Next review date: 12/12/22  Case Management [place] Patient Discharge Ready : No         Discharge Interventions  Discharge Activities: Confirmed insurance benefits with patient, Chart review greater than 30 minutes, Ensure discharge transportation plans were discussed and agreed upon, Identify DME needs-including hard bent metal or oxygen/nebulizer  Teach Back Provided : Discharge Plan, Medications (Uses [facility] in [place])        Verdell Face,  RN    Care Coordinator Initial Assessment Progress Note    S - Situation    Spoke with: Patient  Assessment Reason: 24 hour new admission  Type of Residence: Single story home              B - Background    Primary Contact Information: Verified emergency contact, Emergency Contact Information, Mobile # (comment)  Primary Decision Maker: Patient  Advance Directive in place: No  Outpatient Care Provider Assignment: Primary care MD assigned  PCP Name/address/phone: Vivianne Master, MD    [place]   [phone]      Functional status prior to admission for instrumental ADLs: Independent    Insurance Coverage: Full Coverage/Able to Pay  Primary Insurance:  [employer]  Secondary Insurance:  [employer] [place]            Religious/Cultural Factors: No Preference  Diet/Texture: By mouth            A - Assessment    Patient Goals: Increase support services to permit return home    Current issues:  (GI Bleed)    Does the patient have an advanced or serious illness?: Yes                                                      R - Recommendations    Discussed and provided education on: DC plan, Living situation, Insurance, Care Planning  Discharge  Plan : Home health, Home, new meds  Existing services to resume: Family support  Type of Residence: Single story home    Does the patient have any DME equipment needs?: No equipment needed  Services needing prior authorization: Home Health    Interventions      Involvement in Discharge Planning: Patient  Choice of providers: Yes          Teach Back Provided : Discharge Plan, Medications (Uses [facility] in [place])      Discharge Activities: Confirmed insurance benefits with patient, Chart review greater than 30 minutes, Ensure discharge transportation plans were discussed and agreed upon, Identify DME needs-including hard bent metal or oxygen/nebulizer    Referral made to: [facility] per patient's choice)    DC Plan  Primary Caregiver is willing  and able to perform any and all duties?: Yes    Next review date: 12/12/22  Support Systems: Spouse/significant other, Family members    DME equipment needs: No equipment needed  Services needing prior authorization: Home Health, Medications    Freedom of Choice Was Discussed: Yes      Does the patient need discharge transport arranged?: No (Husband will transport home)    Case Management assessment reviewed with the patient or primary caregiver?: Yes (Reviewed with patient who is alert and oriented)    Who will patient reside with? : Spouse/significant other  Patient wishes to be discharged to:  (Ex-lap, diverting colostomy after colonic perforation from [place] Hx of Ovarian CA and Colon CA undergoing Chemotherapy. Lives with husband and brother in law in 1 story home with 3 steps to enter. Has meds filled at [facility] in [place]. No equipment.)      Verdell Face, RN      Electronically signed by Verdell Face, RN at 12/10/2022  1:29 PM EST

## 2022-12-10 NOTE — Nursing Note (Signed)
Formatting of this note might be different from the original.  Patient received from SICU in bed into Room 4604.  Midline abdominal dressing is dry and intact.  Small dressing present on right upper chest covering Medport site.  G-tube present on left abdomen.  Colostomy stoma red, ostomy bag contains a small amount of loose, brown stool.  JP drain to abdomen contains a small amount of red fluid.  Foley catheter patent and draining yellow urine.  Patient's vital signs are stable.  Her oxygen level is 96% on room air; continuous pulse oximeter in place.  Electronically signed by Daleen Squibb, RN at 12/10/2022  8:44 PM EST

## 2022-12-11 NOTE — Consults (Signed)
Associated Order(s): IP CONSULT TO PALLIATIVE CARE  Formatting of this note is different from the original.  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  Pt presented to [facility] ED from home for several day history of black, tarry stools and N/V.  She was admitted under hospitalist team, evaluated by GI and underwent upper endoscopy which showed gastritis and flex/sig which showed severe recto-sigmoid stricture that could not be transversed.  Biopsies were obtained and sent.  Pt was noted to have increased abdominal distension and general surgery was consulted with recommendation of NGT placement.  Follow-up imaging showed development of frank pneumoperitoneum c/w perforated viscus.  Surgeon spoke with pt regarding her overall condition, high risk for surgical intervention, and likely poor prognosis with pt stating her desire to move forward with surgery.  Ex-lap was performed with findings of dense adhesions and tumor burden throughout the abdomen resulting in obstruction. Pt required significant time spent on adhesion lysis, was able to have ostomy created and g-tube placed.  Surgical team spoke with pt further after the procedure regarding the findings, her systemic illness, and the trajectory for recovery during which time pt became distressed.  Pt had been offered hospice in the past by her oncologist  (Dr. Cristina Gong with [facility]) however had previously expressed her wish to continue to fight the cancer.  She relayed to the chaplain that she has 6 children who expect her to be the strong one.  Pt was agreeable to further support from the palliative team, re: [place].    Review of Systems   Constitutional:  Positive for activity change, appetite change and fatigue.   HENT: Negative.     Eyes: Negative.    Respiratory: Negative.     Cardiovascular: Negative.    Gastrointestinal:  Positive for abdominal distention. Negative for nausea and vomiting.        LLQ abdominal pain   Endocrine: Negative.    Genitourinary: Negative.    Musculoskeletal: Negative.    Skin: Negative.    Allergic/Immunologic: Negative.    Neurological:  Negative for weakness.   Hematological: Negative.    Psychiatric/Behavioral:  The patient is nervous/anxious.      Past Medical History:  Past Medical History:   Diagnosis Date    Colon cancer (CMS/HCC)     Hepatitis C infection     treated 2015    Hypertension     Ovarian cancer (CMS/HCC)     PAD (peripheral artery disease) (CMS/HCC)      Past Surgical History:  Past Surgical History:   Procedure Laterality Date    COLON SURGERY      OVARY SURGERY       Social History:  Sue [name]  reports that she has never smoked. She has never used smokeless tobacco. She reports that she does not currently use alcohol. She reports current drug use.  Family History:  History reviewed. No pertinent  family history.    BP 138/73 (BP Location: Right arm)   Pulse 89   Temp 98.4 F (36.9 C) (Oral)   Resp 16   Ht '5\' 7"'$  (1.702 m)   Wt 113 lb 15.7 oz (51.7 kg)   SpO2 99%   BMI 17.85 kg/m     Wt Readings from Last 3 Encounters:   12/11/22 113 lb 15.7 oz (51.7 kg)   12/03/22 109 lb 10.9 oz (49.7 kg)   12/03/22 109 lb 10.9 oz (49.7 kg)     Body mass index is 17.85 kg/m.    Physical Exam  Vitals and nursing note reviewed.   Constitutional:       General: She is awake.      Appearance: She is underweight.      Comments: African  American female, frail, chronically ill-appearing   HENT:      Head:      Comments: (+) bitemporal wasting     Nose: Nose normal.      Mouth/Throat:      Mouth: Mucous membranes are moist.   Eyes:      General:         Right eye: No discharge.         Left eye: No discharge.   Cardiovascular:      Rate and Rhythm: Normal rate.   Pulmonary:      Effort: Pulmonary effort is normal.   Abdominal:      General: There is no distension.      Comments: (+) ostomy in place   Genitourinary:     Comments: Deferred  Musculoskeletal:      Comments: (+) generalized muscle wasting   Skin:     General: Skin is warm and dry.   Neurological:      Mental Status: She is alert and oriented to person, place, and time.   Psychiatric:         Mood and Affect: Mood normal.         Behavior: Behavior normal. Behavior is cooperative.     Labs:  I have personally reviewed the results in the medical record.    Imaging:  I have personally reviewed the imaging results in the medical record.    Subjective:    No visitors at time of initial PC NP visit.  Introductions performed and discussed reason for visit.  Given symptom management needs, deferred discussion until pain was better controlled (was 13/10 on initial presentation, reduced to 4/10 s/p Dilaudid '1mg'$ ).    Talked about my role and focus of palliative medicine which is to support pts and families who are living with a serious illness. Discussed difference between this and hospice, which is part of PCM, but is focused on supporting patients with a peaceful and natural death.  Pt consented to discussions regarding her [place] and healthcare wishes.  She talked to me at length regarding her fears, hopes, worries for her family, and the challenge of living with advanced cancer while also trying to be strong for her loved ones.  States she often hides behind jokes and smiles, gets frustrated at times with doctors who she feels do not always take her point of view into account.  She prefers  transparent and straightforward conversation, with the caveat that we be sensitive to who else is in the room.  Reports that her husband (2nd husband, married x 26 yrs) has had 3 heart attacks and she worries about the impact this news will have  on his health.  Wants family to know what is going on "but not all of the details".    Discussed her past experiences with death - saw her first husband when he was on hospice, reported that he "just went" when it was his time and she would prefer to have a similar death (peaceful and natural).  She reports seeing loved ones who have died and that this gives her comfort (belief that there is a place beyond death).  Discussed some of the complications she has already experienced with cancer treatments and that, sometimes, the burdens of treatment can outweigh the benefit.  She has known people who actually got better when they stopped treating their disease and started focusing on comfort with hospice care.    She's been trying to balance longevity with quality of life, is hopeful to live till her 62nd birthday ([birth year]).  She defines good quality of life as one where she's not a burden and still able to care for herself.  She has a living will but isn't sure where it is, does know that she had stated her desire that her kids make the final decisions.  We talked about her worry for her family and her desire to not burden them.  Advised that family can end up feeling more traumatized when they lose a loved one if they are put in the position of having to make final decisions.  Talked about ways that she can make some of those final decisions herself and of the importance of communicating this to her family.  Discussed her desire for a peaceful and natural death which is best reflected with DNR code status.  Talked about DDNR and how this can communicate her decision without leaving that burden for her children to carry.  Pt was agreeable to continuing these conversations  further tomorrow.  Her husband came back to the bedside at the end of these discussions.  Gave him a brief recount of what we had discussed and of recommendation that her family know and understand her wishes.  We talked about the painful nature of these kinds of talks, but the benefit of having them done and over with so that the focus can return to living her life.  Husband stated his agreement, is going to be at bedside again tomorrow.  Tentatively set time of 1pm for further conversations.    Ensured that they had my contact details.  Pt denied further questions or concerns at that time.    Assessment:  Acute GI bleeding  Patient Active Problem List   Diagnosis    Protein calorie malnutrition (CMS/HCC)    Ovarian CA, unspecified laterality (CMS/HCC)    At high risk for falls    Acute GI bleeding    Chronic hepatitis C virus infection (CMS/HCC)    HTN (hypertension)    Vascular problem    Pneumoperitoneum     Recommendations/Plan:  I. Pain and sx management   - PMP reviewed   - Tylenol '975mg'$  q 8 hrs   - Lidocaine 4% TD patch   - Lyrica '200mg'$  PO q 12 hrs   - Dilaudid 0.'5mg'$  IV or '1mg'$  IV q 4 hrs PRN moderate or severe pain   - Glycolax 17gm PO q day   - Zofran '4mg'$  IV q 6 hrs PRN N/V with compazine '5mg'$  IV as second line   - No recommendations, defer to attending team for management  II. GOC   - FULL, discussed do not  attempt resuscitation/allow a natural death as detailed above   - Short-term: ongoing treatment of acute issues and clinical optimization, wants to try to balance longevity with quality of life, has been setting short-term goals to achieve (e.g., seeing granddaughter's 5th birthday which was in December, making it to her 62nd birthday in March)   - Long-term: reports having a living will but is not sure where it is and is agreeable to updating this to reflect her current wishes.   - Agreeable to further discussions, re: [place].  Will meet with her again tomorrow.  III. Spiritual/Psychosocial   - A&Ox3,  participatory in conversations, demonstrates understanding of situation, is consistent in responses and preferences regarding treatment, demonstrates comprehension of associated benefits/burdens/alternatives and rationale for choices.  She demonstrates good insight into the advanced nature of her illness and of her prognosis.   - Reportedly has a living will naming 2 of her 6 children as medical surrogates.  She is not sure where this is and we do not have a copy on file.  In the absence of documentation, it would default to the [place] legal hierarchy for healthcare agents, with her husband being in the role of a medical surrogate.   - PC and spiritual care for support as needed  IV. Disposition   - HD #4   - Poor long-term prognosis in setting of advanced ovarian cancer with possible CRC recurrence.  - Per oncology, laparotomy findings do not indicate treatment failure of current chemo as this was only very recently started.  If pt is able to make adequate recovery, could consider continuing with current treatment vs shifting her GOC to comfort/hospice.    Discussed with Jerrye Beavers (primary RN) and via secure chat with Dr. Cristina Gong (oncology) and and Dr. Earleen Newport (attending team)    A total of 70 minutes were spent in chart preparation/chart review, direct patient contact, review of pertinent studies and lab work as well as in documentation with a minimum of an additional 16 minutes devoted to advanced care planning.  This was all performed on the same day as the patient's visit.    Salvatore Marvel, NP-C, [facility]  Palliative Care  Pager: [phone]    Electronically signed by Jerral Ralph, MD at 12/11/2022  4:17 PM EST

## 2022-12-11 NOTE — Care Plan (Signed)
Formatting of this note might be different from the original.    Problem: PAIN - ADULT  Goal: Verbalizes/displays adequate comfort level or baseline comfort level  Outcome: Progressing  Flowsheets (Taken 12/11/2022 1016)  Verbalizes/Displays Adequate Comfort Level or Baseline Comfort Level:   Encourage patient to monitor pain and request assistance   Assess pain using appropriate pain scale   Administer analgesics based on type and severity of pain and evaluate response   Implement non-pharmacological measures as appropriate and evaluate response    Problem: INFECTION - ADULT  Goal: Absence of infection during hospitalization  Outcome: Progressing  Flowsheets (Taken 12/11/2022 1016)  Absence of Infection During Hospitalization:   Assess and monitor for signs and symptoms of infection   Monitor lab/diagnostic results   Monitor all insertion sites i.e., indwelling lines, tubes and drains   Institute appropriate cooling/warming therapies per order   Administer medications as ordered    Problem: SAFETY ADULT - FALL  Goal: Free from fall injury  Outcome: Progressing  Flowsheets (Taken 12/11/2022 1016)  Fall prevention/injury reduction interventions for ALL patients scoring 5 or less on the [place]:   Orient patient/family to surroundings   Maintain bed in lowest position with brakes locked   Place **call bell** and personal items in easy reach   **Respond promptly to call light   Keep ambulatory assistive devices within easy reach   Instruct all ambulatory patients to use skid proof footwear   Assess patient for use of side rails X 2    Electronically signed by Barb Merino, RN at 12/11/2022 10:17 AM EST

## 2022-12-11 NOTE — Consults (Signed)
Associated Order(s): IP CONSULT TO SPIRITUAL CARE  Formatting of this note might be different from the original.    Spiritual Care Note    Subjective: Sue [name] was sitting up in bed visiting with her husband and two sons. She said she was happy now that her family was here. She has another son coming from [place].     Objective: I responded to consult. Sue [name] verified her name and birth date.     Assessment: Sue [name] did not appear to be in spiritual distress today. She said she just wants to spend time with her family.    Intervention: I provided a listening ear. She asked for a card.    Plan: I reminded Sue [name] that Chaplain support is available at her or her family's request.    Productivity  Time spent on chart review/staff (min): 2 min  Time spent providing spiritual care (min): 6 min  Time spent charting (min): 5 min  Total Productivity: 13 min      [place] Resident  Electronically signed by Marlin Canary, Chaplain at 12/12/2022 12:23 PM EST

## 2022-12-11 NOTE — Progress Notes (Signed)
Formatting of this note is different from the original.  Images from the original note were not included.      General Surgery Progress Note    Reason for visit:   Abdominal pain, dark stools    Diet:  NPO    DVT ppx:  SCDs    Operation Performed:  12/09/22   Exploratory laparotomy with LOA, g-tube, colostomy  flex sigmoidoscopy by GI    Subjective   NAEO.    Pain well controlled. Tolerating sips with meds, requesting bacon and eggs. Urinating without issue. Flatus and succus in ostomy bag. Denies fevers, chills, shortness of breath, chest pain.    Review of Systems  As above.     Physical Exam  Physical Exam  Vitals and nursing note reviewed.   Constitutional:       General: She is awake. She is not in acute distress.     Appearance: She is underweight. She is chronically ill-appearing.   HENT:      Head: Normocephalic and atraumatic.      Nose:      Comments: NGT with small amount of brown output.   Eyes:      Pupils: Pupils are equal, round, and reactive to light.   Cardiovascular:      Rate and Rhythm: Normal rate and regular rhythm.      Pulses: Normal pulses.   Pulmonary:      Effort: Pulmonary effort is normal. No tachypnea, accessory muscle usage or respiratory distress.      Breath sounds: Normal breath sounds.      Comments: Room air.   Abdominal:      General: Abdomen is flat. The ostomy site is clean.      Tenderness: There is generalized abdominal tenderness.      Comments: Abdomen taut but soft. Midline abdominal incision. JP drain with serosanguinous output. Ostomy appliance intact, ostomy product of gas and dark liquid. G tube about left abdomen in place to gravity  Genitourinary:     Comments: Foley with light urine.   Musculoskeletal:      Right lower leg: No edema.      Left lower leg: No edema.   Skin:     General: Skin is warm and dry.   Neurological:      Mental Status: She is alert and oriented to person, place, and time.      GCS: GCS eye subscore is 4. GCS verbal subscore is 5. GCS motor  subscore is 6.     Intake/Output this shift:  I/O         01/17 0700  01/18 0659 01/18 0700  01/19 0659    P.O. 0     I.V. (mL/kg) 738.3 (14.3)     NG/GT 20     IV Piggyback 200     Total Intake(mL/kg) 958.3 (18.5)     Urine (mL/kg/hr) 975 (0.8)     Emesis/NG output 50 150    Drains 45 80    Stool 0 50    Blood      Total Output 1070 280    Net -111.7 -280      Unmeasured Stool Occurrence 0 x          Assessment/Plan   62 y.o. F POD#2 s/p EGD, flex sig, with takeback for ex-lap, lysis of adhesions, G-tube placement, and colostomy creation, found to have dense tumor burden and suspected transverse colon perforation, in good spirits despite poor prognosis. Patient amenable to  consultation with palliative care.    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 CT scan of the abdomen with rectosigmoid colon wall thickening and proximal small and large bowel dilatation concerning for colonic obstruction vs neoplasm. Patient underwent EGD and flex sigmoidoscopy by GI on 12/09/22. Post-procedurally, patient had worsening abdominal distension and a repeat KUB revealed pneumoperitoneum. General surgery was consulted and patient underwent exploratory laparotomy with lysis of adhesions, g-tube placement, and colostomy creation. Intraoperatively, patient found to have dense adhesions and tumor burden throughout the abdomen resulting in obstruction and had a suspected transverse colon perforation.     - NPO, plan to advance 1/19  - IVF D5 LR  - F/u biopsies  -Pain: IV tylenol, prn dilaudid, home lyrica    - pulm toilet  - Monitor drain output  - WOCN consult for new ostomy  - Palliative care consulted  -  Hem/onc consulted, appreciate recs  - Cont cefepime and flagyl x 5 days (stop date 1/21)  - PT/OT    Laurell Roof, DO  General Surgery, PGY-1  ACS Pager [phone]    Principal Problem:    Acute GI bleeding  Active Problems:    Chronic hepatitis C virus infection (CMS/HCC)    HTN (hypertension)    Vascular problem    Pneumoperitoneum    Face to Face Patient Counseling / Coordinating Care >50% of Encounter Time: RHS F to F Enc Time (YES/NO): Yes  Total Encounter Time (in minutes): 20    Portions of the record may have been created with voice recognition software. Occasional wrong word or "sound a like" substitutions may have occurred due to the inherent limitations of voice recognition software. Read the chart carefully and recognize, using context, where substitutions have occurred.    Electronically signed by Verita Lamb, MD at 12/11/2022 10:06 PM EST    Associated attestation - Verita Lamb, MD - 12/11/2022 10:06 PM EST  Formatting of this note is different from the original.  I saw and evaluated the patient, participating in the key portions of the service.  I reviewed the resident?s note.  I agree with the resident?s findings and plan.

## 2022-12-11 NOTE — Progress Notes (Signed)
Formatting of this note is different from the original.  [facility] [place] [address]  Phone: [phone]    Physical Therapy Abbreviated Evaluation    Name: Sue [name]  MRN: [id number]  DOB:  1961/08/11  Date of service: 12/11/2022  Diagnosis:     ICD-9-CM ICD-10-CM   1. Acute GI bleeding  578.9 K92.2   2. Left lower quadrant abdominal pain  789.04 R10.32   3. History of rectal cancer  V10.06 Z85.048   4. Hypomagnesemia  275.2 E83.42   5. Pneumoperitoneum  568.89 K66.8       Allergies: reviewed  Medications: reviewed    Past medical history:   Past Medical History:   Diagnosis Date    Colon cancer (CMS/HCC)     Hepatitis C infection     treated 2015    Hypertension     Ovarian cancer (CMS/HCC)     PAD (peripheral artery disease) (CMS/HCC)        Room number: 4604  Rehab precautions: PT/OT  Treatment start time: 1358    History of present illness: Patient is a 62 y.o. female s/p exploratory laparotomy with lysis of adhesions, g-tube placement, and colostomy creation on 12/09/22.  Date of injury/surgery: admitted 12/08/2022    SUBJECTIVE  Patient report: "I haven't been out of bed in a couple days."  Pain: none    Prior level of function: independent with no restrictions      Home environment:               Type: single family home   Stairs to enter: 5 steps, Handrails: bilateral   Stairs to manage inside: none   Social support: husband, able to assist at discharge: Yes   Equipment owned: [facility] and [place]  Initial observation: Supine with HOBE, husband present. JP drain, colostomy, and IV attached.   Strength: 5/5 bilaterally  Range of motion: WFL  Sensation: WFL    Functional Mobility:    Bed Mobility: SBA for supine > sit with HOBE, and CGA for sit > supine for LE management. Verbal cueing required for hand placement and perform transfers from a sidelying position. SBA for seated forward scooting.   Transfers: SBA for sit <> stand  from EOB with unsteadiness noted initially with stand. Patient reported dizziness upon standing, however resolved with momentary standing rest break.   Ambulation: SBA for 33f using RW. SBA for 251fwith no AD. No gait deficits noted.    Patient status:   Position: bed   Call bell within reach: Yes   Alarm: Bed alarm    ASSESSMENT  Response to Treatment: Patient required no physical assistance for mobility, transfers and ambulation. Patient demonstrated a moment of unsteadiness with initial sit > stand transfers, however likely due to patient moving too quickly. Patient was challenged with supine <> sit transfer, though likely due to soreness along abdominal incision site. Discussed with patient physical therapy discharge recommendation. Based on patient's current presentation, no further acute care therapy needs are required.    Treatment frequency & duration: Acute care physical therapy is not recommended for the following reason(s): Patient at baseline functional mobility    Equipment needs: none    PLAN  Recommended Consults: none  Discharge recommendations: Upon acute care discharge, it is recommended that the patient receive: Home Independently    Signature: Electronically Signed By: DaOrbie PyoSPT 12/11/2022 2:54 PM  Electronically signed by Roda Shutters, PT at 12/11/2022  3:27 PM EST    Associated attestation - Roda Shutters, PT - 12/11/2022  3:27 PM EST  Formatting of this note might be different from the original.  I was present and participated in all aspects of clinical care and decision making for this patient.    Electronically Signed By: Roda Shutters, PT 12/11/2022 3:27 PM

## 2022-12-12 NOTE — Procedures (Signed)
Formatting of this note might be different from the original.  Images from the original note were not included.  Inpatient Ostomy Services Note    Reason for consult: colostomy teaching    Subjective: Patient is awake and alert. Patient is apprehensive about teaching but is agreeable.     Objective: Patient presents on a [place] bed. Patient's husband is at the bedside and participates in teaching.     Assessment:    Location: LUQ  Stoma: loop colostomy, red, moist, central os, productive, red rubber catheter sutured in place, minimally above skin level.  Mucocutaneous junction: sutured  Peristomal skin: intact  Effluent: brown, watery  Pouching supplies: small piece of [employer] CeraRing (#8805) in dip at 5 o'clock, Stomahesive around colostomy and red rubber catheter, 2 3/4" [employer] flat wafer [phone]) and drainable ostomy pouch (#76720)     Teaching provided:  Patient and her husband participated as able in verbal and physical review of ostomy care instructions including:  Removing current wafer and pouch using adhesive remover wipes.  Cleansing stoma and surrounding skin with warm water or enzymatic spray and soft cloths.  Measuring stoma.  Cutting wafer to appropriate size.  Application of pie-shaped [employer] CeraRing to dip at 5 o'clock.  Application of stomahesive paste around stoma and red rubber catheter.  Applying wafer to skin.  Attaching pouch to wafer.  Emptying pouch (opening and closing lock n' roll feature).    Reviewed [employer]'s "Understanding Your Colostomy" pamphlet.  Answered patient's questions    Plan:  Patient will continue to familiarize self with ostomy routine and supplies. Practice emptying pouch independently or with bedside RN assist as needed.  Continue to review education materials.    Ostomy nurse will return for further education with patient and husband changing colostomy appliance on Monday.     Time spent with patient:1113-1214    Electronically signed by Flonnie Overman, RN at  12/12/2022 12:51 PM EST

## 2022-12-12 NOTE — Progress Notes (Signed)
Formatting of this note might be different from the original.  Care Coordinator Discharge Plan     Discharge Plan: [facility] with physician follow up    Noted pt transfer to [facility]. Chart reviewed. Noted referral to [facility] and accepted. Pt with a new ostomy. Pt will need 2-3 days of ostomy supplies to assist with transition home. Palliative Care is following. Pt's husband will transport pt home when medically stable. CM to continue to follow and assist.    Discharge Planning  Patient wishes to be discharged to: Home-Home care S[facility]  Referral made to: [facility]  Is Discharge Anticipated in 48 Hours? : No  Discharge Plan : Home health  Barriers to discharge: Medically Complex (GI Bleed, New Colostomy, CA metastaic)  Primary Caregiver is willing and able to perform any and all duties?: Yes  Who will patient reside with? : Spouse/significant other  Support Systems: Spouse/significant other, Family members  DME equipment needs: No equipment needed (new ostomy)  Services needing prior authorization: Home Health, Medications  Freedom of Choice Was Discussed: Yes  Written Resource List Provided : Patient Declined  Choice of providers: Yes  Patient/Family/POA directed to Medicare Compare site for review of post-acute facility/agency quality data : Yes  Case Management assessment reviewed with the patient or primary caregiver?: Yes (Reviewed with patient who is alert and oriented)  How ready does the patient/family feel they are prepared for discharge?: Not prepared  Does the patient need discharge transport arranged?: No (Husband will transport home)  Next review date: 12/12/22  Case Management [place] Patient Discharge Ready : No         Discharge Interventions  Discharge Activities: Chart review less than 30 minutes, Ensure discharge transportation plans were discussed and agreed upon  Home Services: Home Health  Teach Back Provided : Discharge Plan        Eboni Hankins-Worthy    Electronically signed by Cooper Render at 12/12/2022  5:55 PM EST

## 2022-12-12 NOTE — Other (Signed)
Formatting of this note might be different from the original.  Called patient's spouse and informed the pathology results.  He is aware of the findings as well.  Further treatment plans depends upon Heme-Onc advise and palliative care.  Electronically signed by Loel Dubonnet, MD at 12/12/2022  4:26 PM EST

## 2022-12-12 NOTE — Consults (Signed)
Formatting of this note is different from the original.  Nutrition Screen Notes    Nutrition Recommendations:   1. Recommend advancing diet when medically appropriate. If unable to advance diet or pt unable to tolerate diet advancement, recommend TPN. Recommend monitoring labs for refeeding syndrome.    2. RD following    Assessment/Comments:   Sue [name] is a 62 y.o.female admitted w/acute GI bleeding. Active problems include chronic HepC; HTN; vascular problem; pneumoperitoneum. Hx of colon ca; metastatic ovarian ca; peritoneal carcinomatosis. Colonic obstruction due to dense adhesions and tumor burden throughout the abdomen. Pt s/p exploratory laparotomy with lysis of adhesions, g-tube placement for decompression, and colostomy creation. Nutrition screen received for underwt BMI. Recent wt gain noted - 49.9 kg on admission (1/14); 49.2 kg one month ago (11/03/22); 43 kg three months ago (09/03/22). BMI has been in underwt range since 07/2022. Labs/meds/skin integrity reviewed.   Note pt has been npo/cl x 5 days. Recommend advancing diet when medically appropriate. If unable to advance diet or pt unable to tolerate diet advancement, recommend TPN. Recommend monitoring labs for refeeding syndrome.    Current Status:     Reason for Consult:   Reason for Consult: RD Screen      Reason for RD Consult: Low BMI    Malnutrition Criteria:         Nutrition Diagnosis: Altered GI Function related to extensive ca as evidenced by G tube for decompression; colostomy creation    Anthropometrics:   Height: '5\' 7"'$  (170.2 cm)  Weight: 113 lb 15.7 oz (51.7 kg)  Weight Change: -1.34  BMI (Calculated): 17.8 Kg/m2  IBW/kg (Calculated) FEMALE: 61.6 kg    Energy Needs:   Total Energy Estimated Needs: 1610 - 1810 cal (30 - 35 cal/kg) - based on cbw of 51.7 kg  Total Protein Estimated Needs: 62 - 78 gm pro (1.2 -1.5 gm/kg) - based on cbw of 51.7 kg    Reason for Admission:   Principal Problem:    Acute GI bleeding  Active Problems:     Chronic hepatitis C virus infection (CMS/HCC)    HTN (hypertension)    Vascular problem    Pneumoperitoneum    Pertinent Hospital issues/concerns related to nutritional status:   Past Medical History:   Diagnosis Date    Colon cancer (CMS/HCC)     Hepatitis C infection     treated 2015    Hypertension     Ovarian cancer (CMS/HCC)     PAD (peripheral artery disease) (CMS/HCC)        Nutrition Order Assessment:       Dietary Orders             Start     Ordered    12/11/22 1222  Adult Diet Clear Liquid  Diet effective now        Question:  Diet Type  Answer:  Clear Liquid    12/11/22 1221               Adequacy of Intake:      GI:   Gastrointestinal (WDL): Exceptions to WDL  Abdomen Inspection: Soft, Ostomy tube  Last BM Date: 12/11/22    Skin Assessment:   Edema: Peripheral Vascular  Peripheral Vascular (WDL): Within Defined Limits  Anti-Embolism Devices: Bilateral, Sequential compression devices, below knee  Anti-Embolism Intervention: Refused  Cyanosis: None  Capillary Refill: Less than/equal to 2 seconds (All extremities)  Pulses: R radial, L radial  Edema: Left lower extremity, Right lower extremity  RLE Edema: Mild pitting, slight indentation  LLE Edema: Mild pitting, slight indentation    Wounds: Integumentary (WDL): Exceptions to WDL  Does patient have a wound?: Yes  Type of Wound (LDA): Wound    Pressure Ulcer(s): Type of Wound (LDA): Wound     Lab Assessment: Labs: Glu 101; Cr .49; Mg 1.3; Phos 2.2  No results found for: "HGBA1C"     Medications: Medications/Vitamin Supplements: MgOx; phosphorus    Allergy(ies): Penicillins, Hydrocodone, Orange fruit [citrus], Tomato, Demerol hcl [meperidine], Ibuprofen, Morphine, Oxycodone, Strawberry, and Tylenol [acetaminophen]     Intervention:   Goals: Nutrition Goals: Gain Weight, Achieve Adequate Intake    Monitor and Evaluation:   Indicators: Indicators: Energy Intake, Weight Trends, Labs, Bowel Movements, Skin Integrity    Risk Status: Nutrition Risk: High  Risk  Electronically signed by Lanelle Bal, RD at 12/12/2022 11:55 AM EST

## 2022-12-12 NOTE — Care Plan (Signed)
Formatting of this note might be different from the original.    Problem: PAIN - ADULT  Goal: Verbalizes/displays adequate comfort level or baseline comfort level  Outcome: Progressing  Flowsheets (Taken 12/12/2022 0246)  Verbalizes/Displays Adequate Comfort Level or Baseline Comfort Level:   Encourage patient to monitor pain and request assistance   Assess pain using appropriate pain scale   Administer analgesics based on type and severity of pain and evaluate response    Problem: INFECTION - ADULT  Goal: Absence of infection during hospitalization  Outcome: Progressing  Flowsheets (Taken 12/12/2022 0246)  Absence of Infection During Hospitalization:   Assess and monitor for signs and symptoms of infection   Monitor lab/diagnostic results   Monitor all insertion sites i.e., indwelling lines, tubes and drains   Administer medications as ordered  Goal: Absence of fever/infection during anticipated neutropenic period  Outcome: Progressing  Flowsheets (Taken 12/12/2022 0246)  Absence of Fever/Infection During Anticipated Neuropenic Period: Monitor white blood cell count    Problem: SAFETY ADULT - FALL  Goal: Free from fall injury  Outcome: Progressing  Flowsheets (Taken 12/12/2022 0246)  Fall prevention/injury reduction interventions for ALL patients scoring 5 or less on the [place]:   Orient patient/family to surroundings   Place **call bell** and personal items in easy reach   Maintain bed in lowest position with brakes locked   **Respond promptly to call light  Fall risk interventions for patients scoring 6 and above on the [place]:   Place YELLOW magnet/signage/lights outside patient's room   **Place YELLOW socks on patient to decrease slippage and increase fall risk awareness   **Place YELLOW armband on patient's wrist   **Activate bed/chair alarms   Goals: .      Identify possible barriers to meeting goals/advancing plan of care: [name]    Stability of the patient: Moderately Stable - Low risk of  patient condition declining or worsening    End of Shift Summary: .    Electronically signed by Alexander Mt, RN at 12/12/2022  2:47 AM EST

## 2022-12-12 NOTE — Care Plan (Signed)
Formatting of this note might be different from the original.    Problem: PAIN - ADULT  Goal: Verbalizes/displays adequate comfort level or baseline comfort level  Outcome: Progressing    Problem: INFECTION - ADULT  Goal: Absence of infection during hospitalization  Outcome: Progressing    Problem: SAFETY ADULT - FALL  Goal: Free from fall injury  Outcome: Progressing    Electronically signed by Waldo Laine, RN at 12/12/2022 12:24 PM EST

## 2022-12-12 NOTE — Progress Notes (Signed)
Formatting of this note might be different from the original.  PALLIATIVE NOTE    PC follow-up attempt.  Went to bedside - pt was sound asleep.  Spoke with husband for awhile.  He reports that pt has been up all morning, did not sleep well last night, and had only just fallen asleep about 15 minutes prior to my arrival.  He reports that several additional family arrived yesterday.  He feels well supported at present.    Advised that I would re-attempt follow up at earliest opportunity.  He is aware that [facility] service is not in-house Sat/Sun, may not be able to follow up again till Monday depending on census.    Sent notification via secure chat to Dr. Earleen Newport (attending team)    Salvatore Marvel, NP-C, [facility]  Palliative Care  Pager: [phone]  Electronically signed by Jerral Ralph, MD at 12/19/2022  5:07 PM EST

## 2022-12-12 NOTE — Care Plan (Signed)
Formatting of this note might be different from the original.    Problem: PAIN - ADULT  Goal: Verbalizes/displays adequate comfort level or baseline comfort level  Outcome: Progressing    Problem: INFECTION - ADULT  Goal: Absence of infection during hospitalization  Outcome: Progressing  Goal: Absence of fever/infection during anticipated neutropenic period  Outcome: Progressing    Problem: SAFETY ADULT - FALL  Goal: Free from fall injury  Outcome: Progressing    Problem: Potential for Compromised Skin Integrity  Goal: Skin Integrity is Maintained or Improved  Outcome: Progressing  Goal: Nutritional status is improving  Outcome: Progressing    Electronically signed by Georgette Dover, RN at 12/12/2022 10:30 PM EST

## 2022-12-12 NOTE — Progress Notes (Signed)
Formatting of this note is different from the original.  Images from the original note were not included.      General Surgery Progress Note    Reason for visit:   Abdominal pain, dark stools    Diet:  Clear liquid diet    DVT ppx:  SCDs    Operation Performed:  12/09/22   Exploratory laparotomy with LOA, g-tube, colostomy  flex sigmoidoscopy by GI    Subjective   NAEO.    Patient reports mild abdominal pain, would like increase pain control. Tolerating liquid diet. Urinating without issue. Flatus and succus in ostomy bag. Denies fevers, chills, shortness of breath, chest pain.    Review of Systems  As above.     Physical Exam  Physical Exam  Vitals and nursing note reviewed.   Constitutional:       General: She is awake. She is not in acute distress.     Appearance: She is underweight. She is chronically ill-appearing.   HENT:      Head: Normocephalic and atraumatic.      Nose:      Comments: NGT with small amount of brown output.   Eyes:      Pupils: Pupils are equal, round, and reactive to light.   Cardiovascular:      Rate and Rhythm: Normal rate and regular rhythm.      Pulses: Normal pulses.   Pulmonary:      Effort: Pulmonary effort is normal. No tachypnea, accessory muscle usage or respiratory distress.      Breath sounds: Normal breath sounds.      Comments: Room air.   Abdominal:      General: Abdomen is flat. The ostomy site is clean.      Tenderness: There is peri-incisional abdominal tenderness.      Comments: Abdomen soft. Midline abdominal incision. JP drain with small serosanguinous output. Ostomy appliance intact, ostomy productive of gas and dark liquid.  Stoma pink, proud, patent.  G tube about left abdomen, clamped  Genitourinary:     Comments: Foley with light urine.   Musculoskeletal:      Right lower leg: No edema.      Left lower leg: No edema.   Skin:     General: Skin is warm and dry.   Neurological:      Mental Status: She is alert and oriented to person, place, and time.      GCS: GCS eye  subscore is 4. GCS verbal subscore is 5. GCS motor subscore is 6.     Intake/Output this shift:  I/O         01/18 0700  01/19 0659 01/19 0700  01/20 0659    P.O. 600     I.V. (mL/kg)      NG/GT      IV Piggyback      Total Intake(mL/kg) 600 (11.6)     Urine (mL/kg/hr) 0 (0)     Emesis/NG output 150     Drains 140     Stool 500     Total Output 790     Net -190       Unmeasured Urine Occurrence 3 x 1 x    Unmeasured Stool Occurrence 0 x          Assessment/Plan   62 y.o. F POD#3 s/p EGD, flex sig, with takeback for ex-lap, lysis of adhesions, G-tube placement, and colostomy creation, found to have dense tumor burden and suspected transverse colon perforation,  continues to be in good spirits with family present.    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 CT scan of the abdomen with rectosigmoid colon wall thickening and proximal small and large bowel dilatation concerning for colonic obstruction vs neoplasm. Patient underwent EGD and flex sigmoidoscopy by GI on 12/09/22. Post-procedurally, patient had worsening abdominal distension and a repeat KUB revealed pneumoperitoneum. General surgery was consulted and patient underwent exploratory laparotomy with lysis of adhesions, g-tube placement, and colostomy creation. Intraoperatively, patient found to have dense adhesions and tumor burden throughout the abdomen resulting in obstruction and had a suspected transverse colon perforation. 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 Q 12, Dilaudid 0.5 to 1 q.4 p.r.n.  - Monitor drain output  - Palliative care consulted, appreciate recs  - Hem/onc consulted, appreciate recs  - Cont cefepime and flagyl x 5 days (stop date 1/21)  - PT/OT    Laurell Roof, DO  General Surgery, PGY-1  ACS Pager [phone]    Principal Problem:    Acute GI bleeding  Active Problems:    Chronic hepatitis C virus infection (CMS/HCC)    HTN (hypertension)    Vascular problem    Pneumoperitoneum    Face to Face Patient Counseling / Coordinating Care >50% of Encounter Time: RHS F to F Enc Time (YES/NO): Yes  Total Encounter Time (in minutes): 20    Portions of the record may have been created with voice recognition software. Occasional wrong word or "sound a like" substitutions may have occurred due to the inherent limitations of voice recognition software. Read the chart carefully and recognize, using context, where substitutions have occurred.    Electronically signed by Verita Lamb, MD at 12/14/2022  1:13 PM EST    Associated attestation - Verita Lamb, MD - 12/14/2022  1:13 PM EST  Formatting of this note is different from the original.  I saw and evaluated the patient, participating in the key portions of the service.  I reviewed the resident?s note.  I agree with the resident?s findings and plan.

## 2022-12-13 NOTE — Progress Notes (Signed)
Formatting of this note is different from the original.  Images from the original note were not included.      General Surgery Progress Note    Reason for visit: Abdominal pain, dark stools    Diet: Clear liquid diet    DVT ppx: SCDs    Operation Performed: Exploratory laparotomy with LOA, g-tube, colostomy (01/16 Dr. Rondel Jumbo)  Flex sigmoidoscopy by GI (01/16)      Subjective   No acute events overnight,   Sue [name] reports ostomy is putting out air and stool and getting changed regularly. Sue [name] would like to eat some solid food today. Denies fever, chills, nausea and vomiting, SOB, chest pain.     Review of Systems  As above.     Vital Signs:  Vitals:    12/12/22 1007 12/12/22 1517 12/12/22 1840 12/12/22 2339   BP: 125/72 145/73 151/73 126/67   BP Location: Left arm Right arm  Right arm   Pulse: 75 80 79 73   Resp: '18 20 18 18   '$ Temp: 98.7 F (37.1 C) 98.1 F (36.7 C) 98.8 F (37.1 C) 98.3 F (36.8 C)   TempSrc: Oral Oral Oral Oral   SpO2: 93% 93% 94% 93%   Weight:       Height:         Physical Exam  Constitutional:       General: She is awake. She is not in acute distress.     Appearance: She is underweight. She is chronically ill-appearing.   HENT:      Head: Normocephalic and atraumatic.      Nose:      Comments: NGT with small amount of brown output.   Eyes:      Pupils: Pupils are equal, round, and reactive to light.   Cardiovascular:      Rate and Rhythm: Normal rate and regular rhythm.      Pulses: Normal pulses.   Pulmonary:      Effort: Pulmonary effort is normal. No tachypnea, accessory muscle usage or respiratory distress.      Breath sounds: Normal breath sounds.      Comments: Room air.   Abdominal:      General: Abdomen is flat. The ostomy site is clean.      Tenderness: There is peri-incisional abdominal tenderness.      Comments: Abdomen soft. Midline abdominal incision. JP drain with small serosanguinous output. Ostomy appliance intact, ostomy productive of gas and dark liquid.  Stoma pink, proud,  patent.  G tube about left abdomen, clamped  Genitourinary:     Comments: Foley with light urine.   Musculoskeletal:      Right lower leg: No edema.      Left lower leg: No edema.   Skin:     General: Skin is warm and dry.   Neurological:      Mental Status: She is alert and oriented to person, place, and time.      GCS: GCS eye subscore is 4. GCS verbal subscore is 5. GCS motor subscore is 6.   Results from last 7 days   Lab Units 12/12/22  0631 12/11/22  0301 12/10/22  0418 12/09/22  0055   WBC k/mcL 6.3 5.7 3.7* 4.5*   HEMOGLOBIN gm/dL 11.4* 11.3* 11.6* 12.4   HEMATOCRIT % 32.4* 32.2* 33.7* 36.3*   PLATELETS k/mcL 129* 130* 127* 162   NEUTROPHILS % Rel % 60.2  --  81.3 56.9   LYMPHS PCT MAN Rel %  27.3  --  6.8 25.8   MONOS PCT Rel % 11.9  --  11.8 16.7   EOS PCT Rel % 0.3  --  0.0 0.4   BASOS PCT Rel % 0.3  --  0.1 0.2     Results from last 7 days   Lab Units 12/12/22  0631 12/11/22  0301 12/10/22  0418   SODIUM mmol/L 141 136 135   POTASSIUM mmol/L 3.4* 3.3* 4.3   CHLORIDE mmol/L 104 100 101   CO2 mmol/L '27 31 22   '$ BUN mg/dL '8 8 12   '$ CREATININE mg/dL 0.49 0.54 0.58   GLUCOSE mg/dL 101 105 138*   CALCIUM mg/dL 8.1* 9.0 8.8     Intake/Output this shift/ 24hours:  I/O this shift:  In: 240 [P.O.:240]  Out: 200 [Stool:200]    Intake/Output Summary (Last 24 hours) at 12/13/2022 0310  Last data filed at 12/12/2022 2225  Gross per 24 hour   Intake 940 ml   Output 475 ml   Net 465 ml     Assessment/Plan   62 y.o. 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 CT scan of the abdomen with rectosigmoid colon wall thickening and proximal small and large bowel dilatation concerning for colonic  obstruction vs neoplasm. Patient underwent EGD and flex sigmoidoscopy by GI on 12/09/22. Post-procedurally, patient had worsening abdominal distension and a repeat KUB revealed pneumoperitoneum. General surgery was consulted and patient underwent exploratory laparotomy with lysis of adhesions, g-tube placement, and colostomy creation. Intraoperatively, patient found to have dense adhesions and tumor burden throughout the abdomen resulting in obstruction and had a suspected transverse colon perforation. 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     POD#4 s/p EGD, flex sig, with takeback for ex-lap, lysis of adhesions, G-tube placement, and colostomy creation, found to have dense tumor burden and suspected transverse colon perforation, continues to be in good spirits with family present.    - Clear liquid diet, plan to advance  - Pain control per palliative care: Tylenol 970 5q8, lidocaine 4% patch, Lyrica 200 mg  Q12, Dilaudid 0.5 to 1 q.4 p.r.n.  - Monitor drain output  - Palliative care consulted, appreciate recs  - Hem/onc consulted, appreciate recs  - Cont cefepime and flagyl x 5 days (stop date 1/21)  - Sue [name]/OT    Principal Problem:    Acute GI bleeding  Active Problems:    Chronic hepatitis C virus infection (CMS/HCC)    HTN (hypertension)    Vascular problem    Pneumoperitoneum    Rowland Lathe, DO  General Surgery PGY1  ACS Pager [phone]    Face to Face Patient Counseling / Coordinating Care >50% of Encounter Time:   Total Encounter Time (in minutes):    Portions of the record may have been created with voice recognition software. Occasional wrong word or "sound a like" substitutions may have occurred due to the inherent limitations of voice recognition software. Read the chart carefully and recognize, using context, where substitutions have occurred.    Electronically signed by Verita Lamb, MD at 12/14/2022  1:13 PM  EST    Associated attestation - Verita Lamb, MD -  12/14/2022  1:13 PM EST  Formatting of this note is different from the original.  I saw and evaluated the patient, participating in the key portions of the service.  I reviewed the resident?s note.  I agree with the resident?s findings and plan.

## 2022-12-13 NOTE — Care Plan (Signed)
Formatting of this note might be different from the original.    Problem: PAIN - ADULT  Goal: Verbalizes/displays adequate comfort level or baseline comfort level  Outcome: Progressing    Problem: INFECTION - ADULT  Goal: Absence of infection during hospitalization  Outcome: Progressing  Goal: Absence of fever/infection during anticipated neutropenic period  Outcome: Progressing    Problem: SAFETY ADULT - FALL  Goal: Free from fall injury  Outcome: Progressing    Problem: Potential for Compromised Skin Integrity  Goal: Skin Integrity is Maintained or Improved  Outcome: Progressing  Goal: Nutritional status is improving  Outcome: Progressing    Electronically signed by Georgette Dover, RN at 12/13/2022 11:02 PM EST

## 2022-12-13 NOTE — Care Plan (Signed)
Formatting of this note might be different from the original.    Problem: PAIN - ADULT  Goal: Verbalizes/displays adequate comfort level or baseline comfort level  Outcome: Progressing  Flowsheets (Taken 12/12/2022 0246 by Alexander Mt, RN)  Verbalizes/Displays Adequate Comfort Level or Baseline Comfort Level:   Encourage patient to monitor pain and request assistance   Assess pain using appropriate pain scale   Administer analgesics based on type and severity of pain and evaluate response    Problem: INFECTION - ADULT  Goal: Absence of infection during hospitalization  Outcome: Progressing  Flowsheets (Taken 12/12/2022 0246 by Alexander Mt, RN)  Absence of Infection During Hospitalization:   Assess and monitor for signs and symptoms of infection   Monitor lab/diagnostic results   Monitor all insertion sites i.e., indwelling lines, tubes and drains   Administer medications as ordered  Goal: Absence of fever/infection during anticipated neutropenic period  Outcome: Progressing  Flowsheets (Taken 12/12/2022 0246 by Alexander Mt, RN)  Absence of Fever/Infection During Anticipated Neuropenic Period: Monitor white blood cell count    Problem: SAFETY ADULT - FALL  Goal: Free from fall injury  Outcome: Progressing  Flowsheets (Taken 12/12/2022 0246 by Alexander Mt, RN)  Fall prevention/injury reduction interventions for ALL patients scoring 5 or less on the [place]:   [place] patient/family to surroundings   Place **call bell** and personal items in easy reach   Maintain bed in lowest position with brakes locked   **Respond promptly to call light    Electronically signed by Ardis Hughs, RN at 12/13/2022  7:58 AM EST

## 2022-12-14 NOTE — Care Plan (Signed)
Formatting of this note might be different from the original.    Problem: PAIN - ADULT  Goal: Verbalizes/displays adequate comfort level or baseline comfort level  Outcome: Progressing    Problem: INFECTION - ADULT  Goal: Absence of infection during hospitalization  Outcome: Progressing  Goal: Absence of fever/infection during anticipated neutropenic period  Outcome: Progressing    Problem: SAFETY ADULT - FALL  Goal: Free from fall injury  Outcome: Progressing    Problem: DISCHARGE PLANNING  Goal: Discharge to home or other facility with appropriate resources  Outcome: Progressing    Problem: Knowledge Deficit  Goal: Patient/family/caregiver demonstrates understanding of disease process, treatment plan, medications, and discharge instructions  Outcome: Progressing    Problem: Potential for Compromised Skin Integrity  Goal: Skin Integrity is Maintained or Improved  Outcome: Progressing  Goal: Nutritional status is improving  Outcome: Progressing    Problem: Urinary Incontinence  Goal: Perineal skin integrity is maintained or improved  Outcome: Progressing      Electronically signed by Gigi Gin, RN at 12/14/2022  7:27 AM EST

## 2022-12-14 NOTE — Progress Notes (Signed)
Formatting of this note is different from the original.  Images from the original note were not included.      General Surgery Progress Note    Reason for visit: Abdominal pain, dark stools    Diet:  Regular    DVT ppx: SCDs    Operation Performed: Exploratory laparotomy with LOA, g-tube, colostomy (01/16 Dr. Rondel Jumbo)  Flex sigmoidoscopy by GI (01/16)    Subjective   No acute events overnight.    Pt reports doing well.  Reports she ate too much yesterday for breakfast, lunch, and dinner, and now feels increased abdominal pressure.  Reports ostomy output of air and stool, getting changed regularly. Reports small liquid "with chunks" bowel movements from anus.  Ambulating with walker.  Denies fever, chills, nausea and vomiting, SOB, chest pain.     Review of Systems  As above.     Vital Signs:  Vitals:    12/13/22 2230 12/14/22 0319 12/14/22 0614 12/14/22 1038   BP: 136/74 147/90 150/87 126/62   BP Location: Left arm Left arm Left arm Left arm   Pulse: 73 91 91 78   Resp: '18 18 18 18   '$ Temp: 98.6 F (37 C) (!) 99.1 F (37.3 C) 97.1 F (36.2 C) 97.8 F (36.6 C)   TempSrc: Oral Oral Oral Oral   SpO2: 95% 95% 96% 97%   Weight:       Height:         Physical Exam  Constitutional:       General: She is awake. She is not in acute distress.     Appearance: She is underweight. She is chronically ill-appearing.   HENT:      Head: Normocephalic and atraumatic.      Eyes:      Pupils: Pupils are equal, round, and reactive to light.   Cardiovascular:      Rate and Rhythm: Normal rate and regular rhythm.      Pulses: Normal pulses.   Pulmonary:      Effort: Pulmonary effort is normal. No tachypnea, accessory muscle usage or respiratory distress.      Breath sounds: Normal breath sounds.      Comments: Room air.   Abdominal:      General: Abdomen is flat. The ostomy site is clean.      Tenderness: There is peri-incisional abdominal tenderness.      Comments: Abdomen soft. Midline abdominal incision. JP drain with small  serosanguinous output. Ostomy appliance intact, ostomy productive of gas and dark liquid.  Stoma pink, proud, patent.  G tube about left abdomen, clamped  Musculoskeletal:      Right lower leg: No edema.      Left lower leg: No edema.   Skin:     General: Skin is warm and dry.   Neurological:      Mental Status: She is alert and oriented to person, place, and time.      GCS: GCS eye subscore is 4. GCS verbal subscore is 5. GCS motor subscore is 6.   Results from last 7 days   Lab Units 12/12/22  0631 12/11/22  0301 12/10/22  0418 12/09/22  0055   WBC k/mcL 6.3 5.7 3.7* 4.5*   HEMOGLOBIN gm/dL 11.4* 11.3* 11.6* 12.4   HEMATOCRIT % 32.4* 32.2* 33.7* 36.3*   PLATELETS k/mcL 129* 130* 127* 162   NEUTROPHILS % Rel % 60.2  --  81.3 56.9   LYMPHS PCT MAN Rel % 27.3  --  6.8 25.8   MONOS PCT Rel % 11.9  --  11.8 16.7   EOS PCT Rel % 0.3  --  0.0 0.4   BASOS PCT Rel % 0.3  --  0.1 0.2     Results from last 7 days   Lab Units 12/14/22  0536 12/13/22  0331 12/12/22  0631   SODIUM mmol/L 140 141 141   POTASSIUM mmol/L 3.0* 3.0* 3.4*   CHLORIDE mmol/L 99* 101 104   CO2 mmol/L 35* 32 27   BUN mg/dL 5* 7 8   CREATININE mg/dL 0.52 0.47 0.49   GLUCOSE mg/dL 96 91 101   CALCIUM mg/dL 8.3* 8.3* 8.1*     Intake/Output this shift/ 24hours:  I/O this shift:  In: 120 [P.O.:120]  Out: 320 [Emesis/NG output:250; Drains:70]    Intake/Output Summary (Last 24 hours) at 12/14/2022 1307  Last data filed at 12/14/2022 1130  Gross per 24 hour   Intake 960 ml   Output 1440 ml   Net -480 ml     Assessment/Plan   62 y.o. 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 CT scan of the abdomen with rectosigmoid colon wall  thickening and proximal small and large bowel dilatation concerning for colonic obstruction vs neoplasm. Patient underwent EGD and flex sigmoidoscopy by GI on 12/09/22. Post-procedurally, patient had worsening abdominal distension and a repeat KUB revealed pneumoperitoneum. General surgery was consulted and patient underwent exploratory laparotomy with lysis of adhesions, g-tube placement, and colostomy creation. Intraoperatively, patient found to have dense adhesions and tumor burden throughout the abdomen resulting in obstruction and had a suspected transverse colon perforation. 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     POD#5 s/p EGD, flex sig, with takeback for ex-lap, lysis of adhesions, G-tube placement, and colostomy creation, found to have dense tumor burden and suspected transverse colon perforation, with poor prognosis doing well.  Afebrile, hemodynamically normal.    - regular diet as tolerated  - Pain control per palliative care: Tylenol 970 5q8, lidocaine 4% patch, Lyrica 200 mg  Q12, Dilaudid 0.5 to 1 q.4 p.r.n.  - Monitor drain output  - Palliative care consulted, appreciate recs  - Hem/onc consulted, appreciate recs  - Cont cefepime and flagyl x 5 days (stop date 1/21)  - PT/OT    Principal Problem:    Acute GI bleeding  Active Problems:    Chronic hepatitis C virus infection (CMS/HCC)    HTN (hypertension)    Vascular problem    Pneumoperitoneum    Laurell Roof, DO  General Surgery, PGY-1  ACS Pager [phone]    Face to Face Patient Counseling / Coordinating Care >50% of Encounter Time:   Total Encounter Time (in minutes):    Portions of the record may have been created with voice recognition software. Occasional wrong word or "sound a like" substitutions may have occurred due to the inherent limitations of voice recognition software. Read the chart carefully and recognize, using context, where substitutions have  occurred.    Electronically signed by Verita Lamb, MD at 12/15/2022 10:22 PM EST    Associated attestation - Verita Lamb, MD - 12/15/2022 10:22 PM EST  Formatting of this note is different from the original.  I saw and evaluated the patient, participating in the key portions of the service.  I reviewed the resident?s note.  I agree with the resident?s findings and plan.

## 2022-12-14 NOTE — Consults (Signed)
Associated Order(s): IP CONSULT TO [facility]  Formatting of this note might be different from the original.  18 g 2.25" accucath peripheral IV placed to right upper arm with ultrasound by Aurelio Jew RN  Positive blood return and flushing well.  1 attempt. Patient tolerated well.    Image of vein scanned to chart  Electronically signed by Aurelio Jew, RN at 12/14/2022  3:58 PM EST

## 2022-12-15 NOTE — Progress Notes (Signed)
Formatting of this note is different from the original.  [place] to pursue more palliative chemotherapy and continue to balance longevity with quality of life as noted below.    Pt declines to take Tylenol while inpatient, reports being told in past she should never take it "because of the cancer" but she cannot recall a specific reason beyond that.  Has been taking Vicodin (Hydrocodone-Acetaminophen 5/'325mg'$ ) PRN outpatient since at least August 2023 without any side effects that she recalls.  Discussed with her today.  Gets severe pruritus with Oxycodone, mild pruritus with Hydrocodone, and mild N/V with morphine.  Consider PO Hydrocodone '4mg'$  q 4 hrs PRN upon discharge.    RECOMMENDATIONS/PLAN:  I. Pain and Sx Management   - Tylenol '975mg'$  PO q 8 hrs scheduled, pt has declined to take this   - Lidocaine 4% TD patch              - Lyrica '200mg'$  PO q 12 hrs              - Dilaudid 0.'5mg'$  IV or '1mg'$  IV q 4 hrs PRN moderate or severe pain, required total of '6mg'$  in last 24 hrs              - Glycolax 17gm PO q day              - Zofran '4mg'$  IV q 6 hrs PRN N/V with compazine '5mg'$  IV as second line  II. GOC   - FULL   - Short-term: treatment of acute issues, d/c to home in order to continue oncologic therapy.  Wants to balance longevity with quality of life and has been making plans for her 62nd birthday in March.   - Long-term: reports having an ACP from when she lived in [place], believes she has a copy at home.  Recommended reviewing this in order to ensure it continues to reflect her current healthcare wishes.  Pt asked if she could complete a new ACP - left blank copy to review.  Will f/u with her tomorrow depending on disposition.   - PC and spiritual care for support as needed  III. Spiritual/Psychosocial   - A&Ox3, participatory in conversations, demonstrates understanding of situation, is consistent in responses and preferences regarding treatment, demonstrates comprehension of associated  benefits/burdens/alternatives and rationale for choices.   - Pt reports having a living will naming 2 of her 6 children as healthcare agents.  She has been considering updating this, is not sure she wants the burden of decision-making to be on her childrens' shoulders.   - PC and spiritual care for support as needed.  IV. Disposition   - HD #8   - Home with oncology f/u   - Pt resides in [place], [place] which is outside the service area for [facility]'s home-based palliative program.    Do not delay discharge for Palliative Care.    Discussed with primary RN and Dr. 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 Pt presented to [facility] ED from home for several day history of black, tarry stools and N/V. She was admitted under hospitalist team, evaluated by GI and  underwent upper endoscopy which showed gastritis and flex/sig which showed severe recto-sigmoid stricture that could not be transversed. Biopsies were obtained and sent. Pt was noted to have increased abdominal distension and general surgery was consulted with recommendation of NGT placement. Follow-up imaging showed development of frank pneumoperitoneum c/w perforated viscus. Surgeon spoke with pt regarding her overall condition, high risk for surgical intervention, and likely poor prognosis with pt stating her desire to move forward with surgery. Ex-lap was performed with findings of dense adhesions and tumor burden throughout the abdomen resulting in obstruction. Pt required significant time spent on adhesion lysis, was able to have ostomy created and g-tube placed. Surgical team  spoke with pt further after the procedure regarding the findings, her systemic illness, and the trajectory for recovery during which time pt became distressed. Pt had been offered hospice in the past by her oncologist (Dr. Cristina Gong with [facility]) however had previously expressed her wish to continue to fight the cancer. She relayed to the chaplain that she has 6 children who expect her to be the strong one. Pt was agreeable to further support from the palliative team, re: [place].  Met with pt on 12-11-22 with plans for ongoing conversations.    Interval History: HD #8.  EMR and MAR reviewed.  Spiked fever overnight.  Pt declines to take Tylenol as noted above.  Seen by PT/OT with recommendation for home independently.  Surgical specimen obtained 12-09-22 showed invasive carcinoma, high-grade/poorly differentiated involving rectosigmoid mucosa c/w gynecologic tract origin, with notation that the immunoperoxidase staining pattern would support a gynecologic primary origin for this tumor and exclude a primary, metastatic or recurrent colorectal adenocarcinoma.  Immunohistochemical stains for DNA mismatch repair proteins was also performed with IHC panel demonstrating intact expression of DNA mismatch repair proteins within the tumor which is considered normal and would tend to exclude the presence of Lynch syndrome related cancer.    ASSESSMENT:  Patient Active Problem List   Diagnosis    Protein calorie malnutrition (CMS/HCC)    Ovarian CA, unspecified laterality (CMS/HCC)    At high risk for falls    Acute GI bleeding    Chronic hepatitis C virus infection (CMS/HCC)    HTN (hypertension)    Vascular problem    Pneumoperitoneum     SUBJECTIVE:   Met with patient - her husband was asleep in bedside chair throughout our discussions.  Pt recalled speaking with me previously and was agreeable to further discussions regarding her healthcare wishes.  - reports feeling better than last week aside from acute pain at surgical sites  - is  encouraged by her performance status  - desires to continue with oncologic therapy so long as benefit > risks and it continues to support her goal of balancing longevity with quality of life  - has a living will that she did in [place] but wants to review/update this now that she is in [place] in order to ensure that it reflects her current wishes  - also not sure she wants her children to be her healthcare agents, worries this will be too much burden for them    Reviewed a basic living will with patient and left a copy for her to read over.  Pt prefers to defer further discussions until the afternoon if at all possible.  Advised that I would try to return later this afternoon but, if not today, could come back tomorrow.  If pt were to be discharged prior to this, can also notify Education officer, museum  at [facility] and request that she f/u with patient.    Talked to pt, re: symptom management.  - was told in the past not to take Tylenol, but she is not entirely sure why  - thinks it may be due to increased bleeding risk  - she has declined to take any scheduled Tylenol while in hospital  - pointed out to her that she has been taking Tylenol outpatient (is in the Vicodin)  - pt was not aware of any untoward side effects as a result of this  - does report severe itching with oxycodone, mild itching with hydrocodone, and GI upset with morphine  - discussed opioid rotations and how we use these at times for patients who develop a higher tolerance to a specific agent or start to develop side effects (e.g., itching) to a pain medication  - she has not noticed any severe itching or side effects with IV hydrocodone    Pt denied any further questions, concerns, or needs at this time    ROS:   Review of Systems - Negative except surgical site pain (incision, ostomy)    OBJECTIVE:   Physical Exam  Vitals and nursing note reviewed.   Constitutional:       General: She is awake.      Appearance: She is underweight.      Comments: African American female    HENT:      Head:      Comments: (+) bitemporal wasting     Mouth/Throat:      Mouth: Mucous membranes are dry.   Eyes:      General:         Right eye: No discharge.         Left eye: No discharge.   Cardiovascular:      Rate and Rhythm: Normal rate.   Pulmonary:      Effort: Pulmonary effort is normal.   Abdominal:      General: There is no distension.      Comments: (+) surgical incision site with edges well approximated and no signs of erythema.  (+) ostomy beefy red   Genitourinary:     Comments: Deferred  Musculoskeletal:      Right lower leg: No edema.      Left lower leg: No edema.      Comments: (+) generalized muscle wasting   Skin:     General: Skin is warm and dry.   Neurological:      Mental Status: She is alert and oriented to person, place, and time.   Psychiatric:         Behavior: Behavior is cooperative.      Comments: Mood pleasant, affect congruent     A total of 55 minutes were spent in chart preparation/chart review, direct patient contact, review of pertinent studies and lab work as well as in documentation with an additional 16 minutes devoted to advance care plan discussions.  This was all performed on the same day as the patient's visit.    Salvatore Marvel, NP-C, [facility]  Palliative Care  Pager: [phone]    Electronically signed by Jerral Ralph, MD at 12/19/2022  5:07 PM EST

## 2022-12-15 NOTE — Progress Notes (Signed)
Formatting of this note might be different from the original.  Care Coordinator Discharge [place]  Discharge Plan    Discharge Planning    Patient wishes to be discharged to: Home or self-care (Pt will dc home with family ( spouse). Spouse will provide transport. CM will follow for dc needs)  Referral made to: [facility]  Is Discharge Anticipated in 48 Hours? : No  Discharge Plan : Home, new meds, Home, no meds    Barriers to discharge: Other (Comment), Medically Complex (GI Bleed, New Colostomy, CA metastaic ( 12/10/22/) Initiated fever work-up : CXR, UA, blood cultures, c.diff ( 12/15/22))  Primary Caregiver is willing and able to perform any and all duties?: Yes  Who will patient reside with? : Spouse/significant other  Support Systems: Spouse/significant other, Family members  DME equipment needs: No equipment needed  Services needing prior authorization: Medications  Freedom of Choice Was Discussed: Yes  Written Resource List Provided : Patient Declined  Choice of providers: Other (Comment) (N/A)  Patient/Family/POA directed to Medicare Compare site for review of post-acute facility/agency quality data : Yes  Case Management assessment reviewed with the patient or primary caregiver?: Yes (Reviewed with patient who is alert and oriented)  How ready does the patient/family feel they are prepared for discharge?: Not prepared  Does the patient need discharge transport arranged?: No (Husband will transport home)  Next review date: 12/18/22  Case Management [place] Patient Discharge Ready : Yes         Discharge Interventions  Discharge Activities: Chart review less than 30 minutes, Ensure discharge transportation plans were discussed and agreed upon  Home Services: Home Health  Teach Back Provided : Discharge Plan        [facility]    Electronically signed by Constance Goltz at 12/16/2022  8:16 AM EST

## 2022-12-15 NOTE — Nursing Note (Signed)
Formatting of this note might be different from the original.  Notified MD on pt's elevated temp of 102.5 this morning. No new orders at this time. Care of pt on going.  Electronically signed by Tommye Standard, RN at 12/15/2022  4:34 AM EST

## 2022-12-15 NOTE — Care Plan (Signed)
Formatting of this note might be different from the original.    Problem: PAIN - ADULT  Goal: Verbalizes/displays adequate comfort level or baseline comfort level  Outcome: Progressing    Problem: INFECTION - ADULT  Goal: Absence of infection during hospitalization  Outcome: Progressing  Goal: Absence of fever/infection during anticipated neutropenic period  Outcome: Progressing    Problem: SAFETY ADULT - FALL  Goal: Free from fall injury  Outcome: Progressing    Problem: DISCHARGE PLANNING  Goal: Discharge to home or other facility with appropriate resources  Outcome: Progressing    Problem: Knowledge Deficit  Goal: Patient/family/caregiver demonstrates understanding of disease process, treatment plan, medications, and discharge instructions  Outcome: Progressing    Problem: Potential for Compromised Skin Integrity  Goal: Skin Integrity is Maintained or Improved  Outcome: Progressing  Goal: Nutritional status is improving  Outcome: Progressing    Problem: Urinary Incontinence  Goal: Perineal skin integrity is maintained or improved  Outcome: Progressing      Electronically signed by Gigi Gin, RN at 12/15/2022  8:29 AM EST

## 2022-12-15 NOTE — Care Plan (Signed)
Formatting of this note might be different from the original.    Problem: PAIN - ADULT     Goals:     Problem: PAIN - ADULT  Goal: Verbalizes/displays adequate comfort level or baseline comfort level  Outcome: Progressing      Identify possible barriers to meeting goals/advancing plan of care:     Stability of the patient: Moderately Unstable - Medium risk of patient condition declining or worsening      Electronically signed by Doretha Imus, RN at 12/15/2022 10:33 PM EST

## 2022-12-15 NOTE — Progress Notes (Signed)
Formatting of this note is different from the original.  Images from the original note were not included.      General Surgery Progress Note    Reason for visit: Abdominal pain, dark stools    Diet:  Regular    DVT ppx: SCDs    Operation Performed:   Exploratory laparotomy with LOA, g-tube, colostomy (01/16 Dr. Rondel Jumbo)  Flex sigmoidoscopy by GI (01/16)    Subjective   No acute events overnight.    Patient doing well this morning, denies significant abdominal pain, has gas and stool in the colostomy bag, she is looking forward to working with PT today.  Denies fever, chills, nausea and vomiting, SOB, chest pain.     During rounds, patient was found to have a fever of 102. Patient reports having significant loose stools from anus with continued ostomy output.     Review of Systems  As above.     Vital Signs:  Vitals:    12/14/22 2225 12/15/22 0150 12/15/22 0321 12/15/22 0415   BP: 114/58  136/64    BP Location: Left arm  Left arm    Pulse: 78 88 86    Resp: 16  18    Temp: (!) 100.4 F (38 C) (!) 100 F (37.8 C) (!) 102.2 F (39 C) (!) 102.5 F (39.2 C)   TempSrc: Oral Oral Oral Oral   SpO2: 92%  96%    Weight:       Height:         Physical Exam  Constitutional:       General: She is awake. She is not in acute distress.     Appearance: She is underweight. She is chronically ill-appearing.   HENT:      Head: Normocephalic and atraumatic.      Eyes:      Pupils: Pupils are equal, round, and reactive to light.   Cardiovascular:      Rate and Rhythm: Normal rate and regular rhythm.      Pulses: Normal pulses.   Pulmonary:      Effort: Pulmonary effort is normal. No tachypnea, accessory muscle usage or respiratory distress.      Breath sounds: Normal breath sounds.      Comments: Room air.   Abdominal:      General: Abdomen is flat. The ostomy site is clean.      Tenderness: There is peri-incisional abdominal tenderness.      Comments: Abdomen soft. Midline abdominal incision. JP drain with small serosanguinous  output. Ostomy appliance intact, ostomy productive of gas and dark liquid.  Stoma pink, proud, patent.  G tube about left abdomen, clamped  Musculoskeletal:      Right lower leg: No edema.      Left lower leg: No edema.   Skin:     General: Skin is warm and dry.   Neurological:      Mental Status: She is alert and oriented to person, place, and time.      GCS: GCS eye subscore is 4. GCS verbal subscore is 5. GCS motor subscore is 6.   Results from last 7 days   Lab Units 12/12/22  0631 12/11/22  0301 12/10/22  0418 12/09/22  0055   WBC k/mcL 6.3 5.7 3.7* 4.5*   HEMOGLOBIN gm/dL 11.4* 11.3* 11.6* 12.4   HEMATOCRIT % 32.4* 32.2* 33.7* 36.3*   PLATELETS k/mcL 129* 130* 127* 162   NEUTROPHILS % Rel % 60.2  --  81.3 56.9  LYMPHS PCT MAN Rel % 27.3  --  6.8 25.8   MONOS PCT Rel % 11.9  --  11.8 16.7   EOS PCT Rel % 0.3  --  0.0 0.4   BASOS PCT Rel % 0.3  --  0.1 0.2     Results from last 7 days   Lab Units 12/14/22  0536 12/13/22  0331 12/12/22  0631   SODIUM mmol/L 140 141 141   POTASSIUM mmol/L 3.0* 3.0* 3.4*   CHLORIDE mmol/L 99* 101 104   CO2 mmol/L 35* 32 27   BUN mg/dL 5* 7 8   CREATININE mg/dL 0.52 0.47 0.49   GLUCOSE mg/dL 96 91 101   CALCIUM mg/dL 8.3* 8.3* 8.1*     Intake/Output this shift/ 24hours:  I/O this shift:  In: -   Out: 715 [Drains:65; Stool:650]    Intake/Output Summary (Last 24 hours) at 12/15/2022 0654  Last data filed at 12/15/2022 0149  Gross per 24 hour   Intake 680 ml   Output 1205 ml   Net -525 ml     Assessment/Plan   62 y.o. 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 CT scan of the abdomen with rectosigmoid colon wall thickening and proximal small and large bowel  dilatation concerning for colonic obstruction vs neoplasm. Patient underwent EGD and flex sigmoidoscopy by GI on 12/09/22. Post-procedurally, patient had worsening abdominal distension and a repeat KUB revealed pneumoperitoneum. General surgery was consulted and patient underwent exploratory laparotomy with lysis of adhesions, g-tube placement, and colostomy creation. Intraoperatively, patient found to have dense adhesions and tumor burden throughout the abdomen resulting in obstruction and had a suspected transverse colon perforation. 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    She is now s/p (1/16) EGD, flex sig, with takeback for ex-lap, lysis of adhesions, G-tube placement, and colostomy creation, found to have dense tumor burden and suspected transverse colon perforation, with poor prognosis doing well.  Afebrile, hemodynamically normal.    - Regular diet as tolerated  - Pain control per palliative care: Tylenol 975 q8, lidocaine 4% patch, Lyrica 200 mg  Q12, Dilaudid 0.5 to 1 q.4 p.r.n.  - Monitor drain output  - Palliative care consulted, appreciate recs  - Hem/onc consulted, appreciate recs  - Completed course of cefepime and flagyl x 5 days (stop date 1/21)  - PT/OT  - Initiated fever work-up : CXR, UA, blood cultures, c.diff      Acie Fredrickson, MD   PGY-3 General Surgery   ACS Pager [phone]    Principal Problem:    Acute GI bleeding  Active Problems:    Chronic hepatitis C virus infection (CMS/HCC)    HTN (hypertension)    Vascular problem    Pneumoperitoneum    Face to Face Patient Counseling / Coordinating Care >50% of Encounter Time:   Total Encounter Time (in minutes):    Portions of the record may have been created with voice recognition software. Occasional wrong word or "sound a like" substitutions may have occurred due to the inherent limitations of voice recognition software. Read the chart carefully and  recognize, using context, where substitutions have occurred.  Electronically signed by Alvester Morin, MD at 12/16/2022 12:21 PM EST    Associated attestation - Alvester Morin, MD - 12/16/2022 12:21 PM EST  Formatting of this note is different from the original.  I saw and evaluated the patient, participating in the key portions of the service.  I reviewed the resident?s note.  I agree with the resident?s findings and plan.

## 2022-12-16 NOTE — Care Plan (Signed)
Formatting of this note might be different from the original.    Problem: PAIN - ADULT  Goal: Verbalizes/displays adequate comfort level or baseline comfort level  Outcome: Progressing  Flowsheets (Taken 12/16/2022 1115)  Verbalizes/Displays Adequate Comfort Level or Baseline Comfort Level:   Encourage patient to monitor pain and request assistance   Assess pain using appropriate pain scale   Administer analgesics based on type and severity of pain and evaluate response    Problem: SAFETY ADULT - FALL  Goal: Free from fall injury  Outcome: Progressing  Flowsheets (Taken 12/16/2022 1115)  Fall prevention/injury reduction interventions for ALL patients scoring 5 or less on the [place]:   Orient patient/family to surroundings   Place **call bell** and personal items in easy reach   Keep ambulatory assistive devices within easy reach    Electronically signed by Truitt Leep, RN at 12/16/2022 11:15 AM EST

## 2022-12-16 NOTE — Progress Notes (Signed)
Formatting of this note is different from the original.  PALLIATIVE PROGRESS NOTES  Pt required total of '6mg'$  IV Dilaudid in last 24 hrs, is using '1mg'$  dose only.  Will adjust dose to '1mg'$  for moderate pain or 1.'5mg'$  for severe pain q 4 hrs PRN.    RECOMMENDATIONS/PLAN:  I. Pain and Sx Management   - Tylenol '975mg'$  PO q 8 hrs scheduled, pt has started to take this              - Lidocaine 4% TD patch              - Lyrica '200mg'$  PO q 12 hrs              - Dilaudid '1mg'$  IV or 1.'5mg'$  IV q 4 hrs PRN moderate or severe pain   - Simethicone '80mg'$  PO q 6 hrs PRN flatulence/gas pain              - Zofran '4mg'$  IV q 6 hrs PRN N/V with compazine '5mg'$  IV as second line  II. GOC   - FULL   - Short-term: ongoing disease directed therapy and treatment of acute issues.   - Long-term: balance quality of life with longevity   - Pt has developed topic fatigue, re: end of life wishes.  Recommend deferring further discussions while inpatient.  We have talked about her prognosis and the circumstances under which she would be willing to re-visit these types of conversations.  She is agreeable to ongoing check-ins by [facility] as appropriate.  III. Spiritual/Psychosocial   - A&Ox3, participatory in conversations, demonstrates understanding of situation, is consistent in responses and preferences regarding treatment, demonstrates comprehension of associated benefits/burdens/alternatives and rationale for choices.   - Completed "Appointment of a Healthcare Agent" today naming her husband Juanda Crumble as primary.  She named her son [name] and daughter Roderic Ovens as secondary agents.  Copy scanned to EMR.   - PC and spiritual care for support as needed.  IV. Disposition   - HD #9   - PT/OT last saw pt 12-11-22 at which time recommended home independently   - Pt resides outside of home palliative team's service area [facility])    Discussed with [name] (primary RN), Ronnie Derby (SW) and Dr. Alexander Mt (attending)  1530: discussed with CM.  Will f/u with pt once more  tomorrow afternoon and possibly sign off depending on pt wishes.    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 Pt presented to [facility] ED from home for several day history of black, tarry stools and N/V. She was admitted under hospitalist team, evaluated by GI and underwent upper endoscopy which showed gastritis and flex/sig which showed severe recto-sigmoid stricture that could not be transversed. Biopsies were obtained and sent.  Pt was noted to have increased abdominal distension post-procedure.  Follow-up imaging showed development of frank pneumoperitoneum c/w perforated viscus. Surgeon spoke with pt, re: overall condition, high risk for surgery, and likely poor prognosis with pt stating desire to move forward with ex-lap. Ex-lap was performed - found dense adhesions and tumor burden throughout abdomen resulting in obstruction. Pt required significant lysis of adhesions but was able to have ostomy created  and g-tube placed. Surgical team spoke with pt again post-op, re: findings, her systemic illness, and trajectory for recovery during which time pt became distressed. Pt had been offered hospice in the past by her oncologist (Dr. Cristina Gong with [facility]) however had previously expressed her wish to continue to fight the cancer. She was agreeable to further discussions with PC team, re: [place].  Her oncologist has seen her while inpatient.  Based on recent imaging and CA125 results, as well as relatively new start of current therapy, findings on ex-lap did not necessarily indicate failure of current treatment and, if pt desires/functional status supports  this, she could continue with treatment outpatient.   I met with Ms. Espey on 12-11-22 and have spoken to her several times since.  She has been agreeable to ongoing palliative support, goals have remained consistent for treatment of acute issues and desire to balance quality of life with longevity.  Pt is interested in pursuing more treatment for the cancer if that is at all possible and has not been interested in hospice.  She was agreeable to updating her living will, with plans to complete this later this afternoon.    Interval History: HD #9.  EMR and MAR reviewed.  Hospice was consulted by attending team on 12-15-22. Hospice liaison spoke with pt today.  Pt continues to endorse desire for ongoing treatment, is not interested in hospice at this time.  Pt now voices topic fatigue regarding EOL wishes and declined to complete advanced care plan.    ASSESSMENT:  Patient Active Problem List   Diagnosis    Protein calorie malnutrition (CMS/HCC)    Ovarian CA, unspecified laterality (CMS/HCC)    At high risk for falls    Acute GI bleeding    Chronic hepatitis C virus infection (CMS/HCC)    HTN (hypertension)    Vascular problem    Pneumoperitoneum     SUBJECTIVE:   Met with pt this afternoon.  Brother-in-law also present.  Pt appeared to be in pain. Symptom assessment completed.  - nauseated  - endorses "10 and a half out of 10" abdominal pain  - sharp and intermittent  - centralized in abdomen and surgical sites with occasional sharp epigastric pain  - reports liquid stool whenever she eats  - discussed options for symptom management with pt  - requested RN to medicate with IV Dilaudid and nausea medication, will also add simethicone for gas pain    Pt was agreeable to further discussions regarding GOC:  - education on differences between hospice and palliative requested and provided  - continues to endorse desire to treat acute issues in order to pursue more cancer tx    Pt did not want to complete living will  today.  She did complete "Apointment of a Healthcare Agent" naming her husband and 2 of her children as healthcare surrogates.    I noted that there had been a hospice consult - discussed this with pt who verbalized her frustration over this and began to cry, then appeared angry.  She stated she feels that everyone is pressuring her and she does not want to talk about end-of-life.  Acknowledged her frustrations.  Supportive listening provided.  Confirmed that she is, at present, not willing to talk about her end-of-life wishes unless (1) she initiates the discussion or (2) there is another significant decline in her health.  She is aware of her poor prognosis and has developed topic fatigue.  She is agreeable to ongoing PC involvement for  symptom management and psychosocial support.  Will f/u with her tomorrow afternoon.    ROS:   Review of Systems - Negative except nausea, fatigue, intermittent abdominal pain, anxiety    OBJECTIVE:   Physical Exam  Vitals and nursing note reviewed.   Constitutional:       General: She is awake.      Appearance: She is underweight.      Comments: African American female   HENT:      Head:      Comments: (+) bitemporal wasting     Mouth/Throat:      Mouth: Mucous membranes are dry.   Eyes:      General:         Right eye: No discharge.         Left eye: No discharge.   Cardiovascular:      Rate and Rhythm: Normal rate.   Pulmonary:      Effort: Pulmonary effort is normal.   Abdominal:      General: There is no distension.   Genitourinary:     Comments: Deferred  Musculoskeletal:      Right lower leg: No edema.      Left lower leg: No edema.      Comments: (+) generalized muscle wasting   Skin:     General: Skin is warm and dry.   Neurological:      Mental Status: She is alert and oriented to person, place, and time.   Psychiatric:         Behavior: Behavior is cooperative.      Comments: Mood anxious, affect congruent     A total of 55 minutes were spent in chart preparation/chart  review, direct patient contact, review of pertinent studies and lab work as well as in documentation with an additional 16 minutes spent in ACP discussions.  This was all performed on the same day as the patient's visit.    Salvatore Marvel, NP-C, [facility]  Palliative Care  Pager: [phone]  Electronically signed by Jerral Ralph, MD at 12/19/2022  5:07 PM EST

## 2022-12-16 NOTE — Progress Notes (Signed)
Formatting of this note is different from the original.  Images from the original note were not included.      General Surgery Progress Note    Reason for visit: Abdominal pain, dark stools    Diet:  Regular    DVT ppx: SCDs    Operation Performed:   Exploratory laparotomy with LOA, g-tube, colostomy (01/16 Dr. Rondel Jumbo)  Flex sigmoidoscopy by GI (01/16)    Subjective   No acute events overnight  Pt reports that she does have mild nausea but no vomiting, she feels febrile with chills and has a dry cough. She denies sob, chest pain, her abd discomfort is very minimal. Pt also would like to know when she could possible get d/c home.     Review of Systems  As above.     Vital Signs:  Vitals:    12/15/22 1558 12/15/22 1830 12/15/22 2229 12/16/22 0317   BP: 133/60 90/54 114/67 114/59   BP Location: Left arm Left arm Left arm Left arm   Pulse: 82 77 91 (!) 94   Resp: '18 18 18 16   '$ Temp: (!) 101 F (38.3 C) (!) 100.8 F (38.2 C) 98.2 F (36.8 C) (!) 101 F (38.3 C)   TempSrc: Oral Oral Oral Oral   SpO2: 96% 97% 94% 93%   Weight:       Height:         Physical Exam  Constitutional:       General: She is awake. She is not in acute distress.     Appearance: She is underweight. She is chronically ill-appearing.   HENT:      Head: Normocephalic and atraumatic.      Eyes:      Pupils: Pupils are equal, round, and reactive to light.   Cardiovascular:      Rate and Rhythm: Normal rate and regular rhythm.      Pulses: Normal pulses.   Pulmonary:      Effort: Pulmonary effort is normal. No tachypnea, accessory muscle usage or respiratory distress.      Breath sounds: Normal breath sounds.      Comments: Room air.   Abdominal:      General: Abdomen is flat. The ostomy site is clean.      Tenderness: There is peri-incisional abdominal tenderness.      Comments: Abdomen soft. Midline abdominal incision. JP drain with small serosanguinous output. Ostomy appliance intact, ostomy productive of gas and dark liquid.  Stoma pink, proud,  patent.  G tube about left abdomen, clamped  Musculoskeletal:      Right lower leg: No edema.      Left lower leg: No edema.   Skin:     General: Skin is warm and dry.   Neurological:      Mental Status: She is alert and oriented to person, place, and time.      GCS: GCS eye subscore is 4. GCS verbal subscore is 5. GCS motor subscore is 6.   Results from last 7 days   Lab Units 12/15/22  0926 12/12/22  0631 12/11/22  0301 12/10/22  0418   WBC k/mcL 4.0* 6.3 5.7 3.7*   HEMOGLOBIN gm/dL 10.6* 11.4* 11.3* 11.6*   HEMATOCRIT % 30.9* 32.4* 32.2* 33.7*   PLATELETS k/mcL 117* 129* 130* 127*   NEUTROPHILS % MAN Rel % 55  --   --   --    NEUTROPHILS % Rel %  --  60.2  --  81.3   LYMPHS PCT MAN Rel % 19 27.3  --  6.8   MONO PCT MAN Rel % 19  --   --   --    MONOS PCT Rel %  --  11.9  --  11.8   EOS PCT Rel % 1 0.3  --  0.0   BASOS PCT MAN Rel % 0  --   --   --    BASOS PCT Rel %  --  0.3  --  0.1     Results from last 7 days   Lab Units 12/15/22  0644 12/14/22  0536 12/13/22  0331   SODIUM mmol/L 136 140 141   POTASSIUM mmol/L 2.9* 3.0* 3.0*   CHLORIDE mmol/L 97* 99* 101   CO2 mmol/L 34* 35* 32   BUN mg/dL 4* 5* 7   CREATININE mg/dL 0.63 0.52 0.47   GLUCOSE mg/dL 100 96 91   CALCIUM mg/dL 8.2* 8.3* 8.3*     Intake/Output this shift/ 24hours:  I/O this shift:  In: -   Out: 905 [Drains:5; Stool:900]    Intake/Output Summary (Last 24 hours) at 12/16/2022 0538  Last data filed at 12/16/2022 0400  Gross per 24 hour   Intake 840 ml   Output 2721 ml   Net -1881 ml     Assessment/Plan   62 y.o. 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 CT scan of the abdomen with rectosigmoid colon wall thickening and  proximal small and large bowel dilatation concerning for colonic obstruction vs neoplasm. Patient underwent EGD and flex sigmoidoscopy by GI on 12/09/22. Post-procedurally, patient had worsening abdominal distension and a repeat KUB revealed pneumoperitoneum. General surgery was consulted and patient underwent exploratory laparotomy with lysis of adhesions, g-tube placement, and colostomy creation. Intraoperatively, patient found to have dense adhesions and tumor burden throughout the abdomen resulting in obstruction and had a suspected transverse colon perforation. 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      She is now s/p (1/16) EGD, flex sig, with takeback for ex-lap, lysis of adhesions, G-tube placement, and colostomy creation, found to have dense tumor burden and suspected transverse colon perforation, with poor prognosis doing well.  Pt has been hemodynamically normal. However has been febrile yesterday and fever work up was initiated.      - Regular diet as tolerated  - Pain control per palliative care: Tylenol 975 q8, lidocaine 4% patch, Lyrica 200 mg  Q12, Dilaudid 0.5 to 1 q.4 p.r.n.  - Monitor drain output  - Palliative care consulted, appreciate recs  - Hem/onc consulted, appreciate recs  - PT/OT  - Fever work-up   - CXR: New airspace opacity posterior right lower lobe possible pneumonia, of note pt does not have any resp sx  - UA: no infectious process  - blood cultures: Pending  - C.diff - Neg    Principal Problem:    Acute GI bleeding  Active Problems:    Chronic hepatitis C virus infection (CMS/HCC)    HTN (hypertension)    Vascular problem    Pneumoperitoneum    Rowland Lathe, DO  General Surgery PGY1  ACS Pager [phone]  Face to Face Patient Counseling / Coordinating Care >50% of Encounter Time:   Total Encounter Time (in minutes):    Portions of the record may have been created with voice recognition software.  Occasional wrong word or "sound a like" substitutions may have occurred due to the inherent limitations of voice recognition software. Read the chart carefully and recognize, using context, where substitutions have occurred.    Electronically signed by Mora Bellman, MD at 12/18/2022 10:03 PM EST    Associated attestation - Mora Bellman, MD - 12/18/2022 10:03 PM EST  Formatting of this note is different from the original.  I participated in the key portions of the service.  I reviewed the resident?s note.  I agree with the resident?s findings and plan.

## 2022-12-16 NOTE — Progress Notes (Signed)
Formatting of this note might be different from the original.  Care Coordinator Discharge Plan     Discharge Plan    Discharge Planning  .  Patient wishes to be discharged to: Home-Home care Svc (Pt will dc home with hh provide by [facility] for ostomy needs. Pt is unsure if she wants family to provide transport of if she will use a medicaid taxi. Cm will continue to follow for dc needs)  Referral made to: DC Planning  Is Discharge Anticipated in 48 Hours? : Yes  Discharge Plan : Home, new meds, Home health    Barriers to discharge: Other (Comment), Medically Complex (GI Bleed, New Colostomy, CA metastaic ( 12/10/22/) Initiated fever work-up : CXR, UA, blood cultures, c.diff ( 12/15/22)) Negative  Primary Caregiver is willing and able to perform any and all duties?: Yes (Spouse)  Who will patient reside with? : Spouse/significant other  Support Systems: Spouse/significant other, Family members  DME equipment needs: No equipment needed  Services needing prior authorization: Medications, Home Health  Freedom of Choice Was Discussed: Yes  Written Resource List Provided : Patient Declined  Choice of providers: Yes (any hh agency)  Patient/Family/POA directed to Medicare Compare site for review of post-acute facility/agency quality data : Yes  Case Management assessment reviewed with the patient or primary caregiver?: Yes (Reviewed with patient who is alert and oriented)  How ready does the patient/family feel they are prepared for discharge?: Prepared  Does the patient need discharge transport arranged?: No (Husband will transport home)  Next review date: 12/18/22  Case Management [place] Patient Discharge Ready : Yes     Medicare IM  Date discharge Medicare IM given: 12/16/22  Medicare IM Given Time: 1610  Discharge Medicare IM provided by: Enid Skeens [facility])  Discharge Medicare IM given to: Patient  Discharge Medicare IM given via: Face to Face at Bedside    Discharge Interventions  Discharge Activities: Chart  review less than 30 minutes, Ensure discharge transportation plans were discussed and agreed upon  Home Services: Home Health  Teach Back Provided : Discharge Plan        [facility]    Electronically signed by Constance Goltz at 12/16/2022  3:31 PM EST

## 2022-12-16 NOTE — Procedures (Signed)
Formatting of this note might be different from the original.  Images from the original note were not included.  Inpatient Ostomy Services Note    Reason for consult: continued colostomy education    Subjective: Patient is awake, alert, and agreeable with ostomy nurse visit. Per patient she and her husband are sick. She states that every time they eat they have to run to the bathroom. Patient states that she has watery stool coming from the colostomy and from her rectum.     Objective: Patient presents on a [place] bed.     Assessment:    Location: LUQ  Stoma: red, moist, central os, productive, red rubber catheter sutured in place, above the skin surface  Mucocutaneous junction: sutured  Peristomal skin: intact, peristomal area with reduced edema  Effluent: brown, watery  Pouching supplies: Stomahesive around colostomy and red rubber catheter, 2 3/4" Hollister flat wafer (#25852) and drainable ostomy pouch (#[id number])      Teaching provided:  Patient attempted to participate colostomy appliance change. Patient able to remove wafer using adhesive remover wipes. After removing wafer. The patient started having cramps and stated that she had to get to the bathroom quickly. Wound care nurse finished with the colostomy appliance steps using the following steps:  Cleansing stoma and surrounding skin with warm water or enzymatic spray and soft cloths  Measuring stoma  Cutting wafer to appropriate size  Application of stomahesive paste around stoma and red rubber catheter.   Applying wafer to skin  Attaching pouch to wafer  Emptying pouch (opening and closing lock n' roll feature)    Patient assisted to the bathroom by primary nurse.     Patient signed up for [facility].     Plan:  Patient will continue to familiarize self with ostomy routine and supplies. Practice emptying pouch independently or with bedside RN assist as needed.  Continue to review education materials.    Prescription for colostomy supplies  left in front of patient's chart for provider to sign.    Time spent with patient: 2353-6144    Electronically signed by Flonnie Overman, RN at 12/17/2022  8:14 AM EST

## 2022-12-16 NOTE — Consults (Signed)
Associated Order(s): IP CONSULT TO HOSPICE  Formatting of this note might be different from the original.  This BSW met with patient bedside and discussed hospice consult. Patient requested explanation of the difference between Palliative Care and Hospice. Difference explained. She reports she is not ready for hospice at this time and "wants to keep fighting".   Electronically signed by Mammie Lorenzo, MSW at 12/16/2022  1:42 PM EST

## 2022-12-16 NOTE — Care Plan (Signed)
Formatting of this note might be different from the original.    Problem: PAIN - ADULT  Goal: Verbalizes/displays adequate comfort level or baseline comfort level  Outcome: Progressing  Flowsheets (Taken 12/16/2022 2031)  Verbalizes/Displays Adequate Comfort Level or Baseline Comfort Level:   Encourage patient to monitor pain and request assistance   Assess pain using appropriate pain scale   Administer analgesics based on type and severity of pain and evaluate response   Implement non-pharmacological measures as appropriate and evaluate response   Consider cultural and social influences on pain and pain management   Notify Licensed Independent Practitioner if interventions unsuccessful or patient reports new pain    Problem: INFECTION - ADULT  Goal: Absence of infection during hospitalization  Outcome: Progressing  Flowsheets (Taken 12/16/2022 2031)  Absence of Infection During Hospitalization:   Assess and monitor for signs and symptoms of infection   Monitor lab/diagnostic results   Institute appropriate cooling/warming therapies per order   Monitor endotracheal (as able) and nasal secretions for changes in amount and color   Monitor all insertion sites i.e., indwelling lines, tubes and drains   Administer medications as ordered   Instruct and encourage patient and family to use good hand hygiene technique   Identify and instruct in appropriate isolation precautions for identified infection/condition  Goal: Absence of fever/infection during anticipated neutropenic period  Outcome: Progressing    Problem: SAFETY ADULT - FALL  Goal: Free from fall injury  Outcome: Progressing  Flowsheets (Taken 12/16/2022 2031)  Fall prevention/injury reduction interventions for ALL patients scoring 5 or less on the [place]:   Orient patient/family to surroundings   Place **call bell** and personal items in easy reach   Keep ambulatory assistive devices within easy reach   Assess patient for use of side rails X 2   Educate  patient and family on purposeful hourly rounding   Include family in making plan of care for patient   Instruct all ambulatory patients to use skid proof footwear   **Respond promptly to call light   Maintain bed in lowest position with brakes locked    Electronically signed by Jacquenette Shone, RN at 12/16/2022  8:31 PM EST

## 2022-12-17 NOTE — Progress Notes (Signed)
Formatting of this note is different from the original.  Images from the original note were not included.      General Surgery Progress Note    Reason for visit: Abdominal pain, dark stools    Diet:  Regular    DVT ppx: SCDs    Operation Performed:   Exploratory laparotomy with LOA, g-tube, colostomy (01/16 Dr. Rondel Jumbo)  Flex sigmoidoscopy by GI (01/16)    Subjective   No acute events overnight  Pt reports that she had nausea and 1 episode of emesis overnight, patient reports nausea due to eating too much.  Reports feeling febrile with intermittent dry cough, denies shortness of breath or chest pain.  Reports flatus ostomy, no liquid or solid output overnight.  Reports mild discomfort about abdomen.  Reports ambulating without difficulty.  Pt also would like to know when she could possible get d/c home.     Review of Systems  As above.     Vital Signs:  Vitals:    12/16/22 2216 12/17/22 0232 12/17/22 0550 12/17/22 0730   BP: 105/60 117/71 127/79 117/70   BP Location: Left arm Left arm Left arm Left arm   Pulse: 84 (!) 99 87 79   Resp: '18 18 18 16   '$ Temp: (!) 100.4 F (38 C) (!) 102.2 F (39 C) (!) 101.6 F (38.7 C) (!) 102 F (38.9 C)   TempSrc: Oral Oral Oral Oral   SpO2: 95% 96% 95% 93%   Weight:       Height:         Physical Exam  Constitutional:       General: She is awake. She is not in acute distress.     Appearance: She is underweight. She is chronically ill-appearing.   HENT:      Head: Normocephalic and atraumatic.      Eyes:      Pupils: Pupils are equal, round, and reactive to light.   Cardiovascular:      Rate and Rhythm: Normal rate and regular rhythm.      Pulses: Normal pulses.   Pulmonary:      Effort: Pulmonary effort is normal. No tachypnea, accessory muscle usage or respiratory distress.      Breath sounds: Normal breath sounds.      Comments: Room air.   Abdominal:      General: Abdomen is flat. The ostomy site is clean.      Tenderness: There is peri-incisional abdominal tenderness.       Comments: Abdomen soft.  Mild distention, Midline abdominal incision. JP drain with small serosanguinous output. Ostomy appliance intact, ostomy productive of gas.  Stoma pink, proud, interrogated efferent and afferent with red rubber catheter with no additional output.  G tube about left abdomen, clamped, vented at bedside with 50cc bilious output  Musculoskeletal:      Right lower leg: No edema.      Left lower leg: No edema.   Skin:     General: Skin is warm and dry.   Neurological:      Mental Status: She is alert and oriented to person, place, and time.      GCS: GCS eye subscore is 4. GCS verbal subscore is 5. GCS motor subscore is 6.   Results from last 7 days   Lab Units 12/16/22  0636 12/15/22  0926 12/12/22  0631   WBC k/mcL 3.3* 4.0* 6.3   HEMOGLOBIN gm/dL 9.7* 10.6* 11.4*   HEMATOCRIT % 28.1* 30.9* 32.4*  PLATELETS k/mcL 120* 117* 129*   NEUTROPHILS % MAN Rel %  --  55  --    NEUTROPHILS % Rel % 58.5  --  60.2   LYMPHS PCT MAN Rel % 22.8 19 27.3   MONO PCT MAN Rel %  --  19  --    MONOS PCT Rel % 18.1  --  11.9   EOS PCT Rel % 0.2 1 0.3   BASOS PCT MAN Rel %  --  0  --    BASOS PCT Rel % 0.4  --  0.3     Results from last 7 days   Lab Units 12/16/22  0636 12/15/22  0644 12/14/22  0536   SODIUM mmol/L 138 136 140   POTASSIUM mmol/L 2.9* 2.9* 3.0*   CHLORIDE mmol/L 99* 97* 99*   CO2 mmol/L 33* 34* 35*   BUN mg/dL 8 4* 5*   CREATININE mg/dL 0.58 0.63 0.52   GLUCOSE mg/dL 106 100 96   CALCIUM mg/dL 7.9* 8.2* 8.3*     Intake/Output this shift/ 24hours:  I/O this shift:  In: -   Out: 50 [Emesis/NG output:50]    Intake/Output Summary (Last 24 hours) at 12/17/2022 0939  Last data filed at 12/17/2022 3557  Gross per 24 hour   Intake 350 ml   Output 1256 ml   Net -906 ml     Assessment/Plan   62 y.o. 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 CT scan of the abdomen with rectosigmoid colon wall thickening and proximal small and large bowel dilatation concerning for colonic obstruction vs neoplasm. Patient underwent EGD and flex sigmoidoscopy by GI on 12/09/22. Post-procedurally, patient had worsening abdominal distension and a repeat KUB revealed pneumoperitoneum. General surgery was consulted and patient underwent exploratory laparotomy with lysis of adhesions, g-tube placement, and colostomy creation. Intraoperatively, patient found to have dense adhesions and tumor burden throughout the abdomen resulting in obstruction and had a suspected transverse colon perforation. 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      She is now s/p EGD, flex sig, with takeback for ex-lap, lysis of adhesions, G-tube placement, and colostomy creation (1/16), found to have dense tumor burden and suspected transverse colon perforation, with poor prognosis doing well.  Pt has been hemodynamically normal.  Continues to be febrile, continue to monitor.  Patient with nausea, vented palliative G-tube port at bedside with bilious output.  Counseled patient on G-tube port use if nauseated.  Interrogated efferent and afferent limbs of ostomy at bedside with red rubber catheter with no additional output.     - Regular diet as tolerated  - Pain control per palliative care: Tylenol, lidocaine patch, Lyrica, Dilaudid p.r.n.  - Monitor drain output  - Palliative care consulted, appreciate recs  - Hem/onc consulted, appreciate recs  - PT/OT  - If patient nauseated, open G-tube to gravity  - Fever work-up   - CXR: New airspace opacity posterior right lower lobe possible pneumonia  - UA: no infectious process  - blood cultures:  No growth  to date  - C.diff - Neg    Principal Problem:    Acute GI bleeding  Active Problems:    Chronic hepatitis C virus infection (CMS/HCC)    HTN (hypertension)    Vascular problem    Pneumoperitoneum    Laurell Roof, DO  General Surgery, PGY-1  ACS Pager [phone]    Face to Face Patient Counseling / Coordinating Care >50% of Encounter Time:   Total Encounter Time (in minutes):    Portions of the record may have been created with voice recognition software. Occasional wrong word or "sound a like" substitutions may have occurred due to the inherent limitations of voice recognition software. Read the chart carefully and recognize, using context, where substitutions have occurred.    Electronically signed by Minda Ditto, DO at 12/17/2022  1:28 PM EST    Associated attestation - Minda Ditto, DO - 12/17/2022  1:28 PM EST  Formatting of this note is different from the original.  I saw and evaluated the patient, participating in the key portions of the service.  I reviewed the resident?s note.  I agree with the resident?s findings and plan.

## 2022-12-17 NOTE — Care Plan (Signed)
Formatting of this note might be different from the original.    Problem: PAIN - ADULT  Goal: Verbalizes/displays adequate comfort level or baseline comfort level  Outcome: Progressing  Flowsheets (Taken 12/17/2022 1306)  Verbalizes/Displays Adequate Comfort Level or Baseline Comfort Level:   Encourage patient to monitor pain and request assistance   Assess pain using appropriate pain scale   Administer analgesics based on type and severity of pain and evaluate response    Problem: INFECTION - ADULT  Goal: Absence of infection during hospitalization  Outcome: Progressing  Flowsheets (Taken 12/17/2022 1306)  Absence of Infection During Hospitalization:   Assess and monitor for signs and symptoms of infection   Monitor lab/diagnostic results   Monitor all insertion sites i.e., indwelling lines, tubes and drains    Problem: SAFETY ADULT - FALL  Goal: Free from fall injury  Outcome: Progressing  Flowsheets (Taken 12/17/2022 1306)  Fall prevention/injury reduction interventions for ALL patients scoring 5 or less on the [place]:   Place **call bell** and personal items in easy reach   Keep ambulatory assistive devices within easy reach   Orient patient/family to surroundings    Electronically signed by Truitt Leep, RN at 12/17/2022  1:07 PM EST

## 2022-12-17 NOTE — Care Plan (Signed)
Formatting of this note might be different from the original.    Problem: PAIN - ADULT  Goal: Verbalizes/displays adequate comfort level or baseline comfort level  Outcome: Progressing  Flowsheets (Taken 12/17/2022 2016)  Verbalizes/Displays Adequate Comfort Level or Baseline Comfort Level:   Encourage patient to monitor pain and request assistance   Assess pain using appropriate pain scale   Administer analgesics based on type and severity of pain and evaluate response   Implement non-pharmacological measures as appropriate and evaluate response   Consider cultural and social influences on pain and pain management   Notify Licensed Independent Practitioner if interventions unsuccessful or patient reports new pain    Problem: INFECTION - ADULT  Goal: Absence of infection during hospitalization  Outcome: Progressing  Flowsheets (Taken 12/17/2022 2016)  Absence of Infection During Hospitalization:   Assess and monitor for signs and symptoms of infection   Monitor lab/diagnostic results   Monitor all insertion sites i.e., indwelling lines, tubes and drains   Administer medications as ordered   Institute appropriate cooling/warming therapies per order   Monitor endotracheal (as able) and nasal secretions for changes in amount and color   Instruct and encourage patient and family to use good hand hygiene technique   Identify and instruct in appropriate isolation precautions for identified infection/condition  Goal: Absence of fever/infection during anticipated neutropenic period  Outcome: Progressing    Problem: SAFETY ADULT - FALL  Goal: Free from fall injury  Outcome: Progressing  Flowsheets (Taken 12/17/2022 2016)  Fall prevention/injury reduction interventions for ALL patients scoring 5 or less on the [place]:   Orient patient/family to surroundings   Place **call bell** and personal items in easy reach   Keep ambulatory assistive devices within easy reach   Assess patient for use of side rails X 2   Educate  patient and family on purposeful hourly rounding   Include family in making plan of care for patient   Instruct all ambulatory patients to use skid proof footwear   **Respond promptly to call light   Maintain bed in lowest position with brakes locked    Electronically signed by Jacquenette Shone, RN at 12/17/2022  8:17 PM EST

## 2022-12-17 NOTE — Progress Notes (Signed)
Formatting of this note is different from the original.  PALLIATIVE PROGRESS NOTE  Pt has had several providers discuss disease progression and prognosis and has developed topic fatigue as a result.  Is also grappling with loss of control both in terms of disease progression and hospitalization.  Her GOC are documented in my previous discussions with her (see 12-11-22, 12-15-22).  Recommend deferring further GOC conversations/prognosis discussions at this time unless pt initiates or there is a change in condition.  Palliative will continue to follow and work on establishing trust/rapport, provide support.  We are following for symptom management only at present.    RECOMMENDATIONS/PLAN:  I. Pain and Sx Management   - Dilaudid '1mg'$  IV q 4 hrs PRN moderate or severe pain.  Required 6.'5mg'$  in last 24 hrs which is on par with her previous 24 hour requirements.   - D/C'ed standing Tylenol as noted above.   - Continues to endorse nausea.  Requested RN to open G tube to gravity per surgery reccs. Pt requested ondansetron ODT rather than IV formulation, reports this works better.  Changed to ondansetron ODT '4mg'$  PO q 6 hrs PRN.   - There are additional options to assist with pain and sx management.  Given patient preferences, we are somewhat limited.  She prefers to have time to think about any medication changes before making decisions.  We discussed use of transdermal Fentanyl 12.81mg patch.  I will f/u with her about this tomorrow.  II. GOC   - FULL   - Short-term: ongoing medical management and clinical optimization, treatment of acute issues.  Desires to try to get home as soon as possible.   - PC will follow and address as needed.  Please refer to my previous notes for details of past discussions.  III. Spiritual/Psychosocial   - A&Ox3, participatory in conversations, demonstrates understanding of situation, is consistent in responses and preferences regarding treatment, demonstrates comprehension of associated  benefits/burdens/alternatives and rationale for choices.   - Pt completed "Appointment of a Healthcare Agent" with me on 12-16-22 naming her husband and 2 of her children as medical surrogates.  This has been scanned into EMR.   - PC and spiritual care for support as needed.  IV. Disposition   - HD #10   - Pt is hopeful to return home ASAP.  Hospice was consulted by attending team and spoke to pt on 12-16-22.  Pt continued to endorse with hospice liaison that she is not interested in hospice.    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 [facility] for several day history of black, tarry stools and N/V. She was admitted under hospitalist team, evaluated by GI and underwent upper endoscopy which showed gastritis and flex/sig which showed severe recto-sigmoid stricture that could not be transversed. Biopsies were obtained and sent.  Pt was noted to have increased abdominal distension post-procedure.  Follow-up imaging showed development of frank pneumoperitoneum c/w perforated viscus. Surgeon spoke with pt, re: overall condition, high risk for surgery, and likely poor prognosis with pt stating desire to move forward with ex-lap. Ex-lap was  performed - found dense adhesions and tumor burden throughout abdomen resulting in obstruction. Pt required significant lysis of adhesions but was able to have ostomy created and g-tube placed. Surgical team spoke with pt again post-op, re: findings, her systemic illness, and trajectory for recovery during which time pt became  distressed. Pt had been offered hospice in the past by her oncologist (Dr. Cristina Gong with [facility]) however had previously expressed her wish to continue to fight the cancer. She was agreeable to further discussions with PC team, re: [place].  Her oncologist has seen her while inpatient.  Based on recent imaging and CA125 results, as well as relatively new start of current therapy, findings on ex-lap did not necessarily indicate failure of current treatment and, if pt desires/functional status supports this, she could continue with treatment outpatient.   I met with Ms. Oscar on 12-11-22 and GOC are established as noted above - pt is aware of her prognosis and has consistently endorsed her desire for ongoing medical management and disease-directed therapy.  She has been agreeable to ongoing PC support for symptom management and psychosocial support as needed.    Interval History: HD #10.  EMR and MAR reviewed.  Reviewed attending team and RD notes and reccs.    ASSESSMENT:  Patient Active Problem List   Diagnosis    Protein calorie malnutrition (CMS/HCC)    Ovarian CA, unspecified laterality (CMS/HCC)    At high risk for falls    Acute GI bleeding    Chronic hepatitis C virus infection (CMS/HCC)    HTN (hypertension)    Vascular problem    Pneumoperitoneum     SUBJECTIVE:     Met with pt who was seated in BSC.  Assisted with ambulation needs and back into bed.  Pt was very fatigued - discussed her symptom management needs and options for care.    Pt was upset, re: a discussion that occurred earlier today about disease process.  Voiced frustration over not "getting any peace" and feeling that she is not being listened to.  Reports having a very challenging night with nausea, is frequently belching.  She has been fearful of any changes to her pain medication.    Open discussion with pt regarding what we can do to help support her.  Pt prefers to have time to consider any medication changes before making a decision.  She prefers not  to take too many medications or have a lot of med changes all at once.  She is more comfortable with ODT formulation of ondansetron rather than IV.  She is frustrated at continued requests for her to take Tylenol.  She and I have discussed benefit/burden/rationale for Tylenol in past.  She requested that we d/c this.  We discussed some non-pharmacologic options for symptom management - pt was agreeable to opening G tube to gravity and use of PRN cooling blanket for fever.  She was not interested in any other adjuvant medications for symptoms during discussion today.    Discussed her desire to be able to sleep and inability due to pain.  She prefers to stick with IV dilaudid because it is familiar and she knows it works, but agreed that she is waking every 3-4 hours when Dilaudid wears off.  Discussed benefit/burden of continuing this versus benefit/burden of starting a long-acting option.  Discussed option of transdermal Fentanyl patch.  Education provided.  She agreed to consider use of long-acting option, and to have f/u discussion about it tomorrow.  Notified RN, re: pt request for medication for nausea, pain, and opening G tube to gravity.    ROS:   Review of Systems - Negative except fatigue, abdominal bloating, nausea, vomiting, chills, abdominal pain, eructation, anxiety    OBJECTIVE:   Physical Exam  Vitals and nursing note reviewed.   Constitutional:       General: She is awake but appears fatigued.      Appearance: She is underweight.      Comments: African American female   HENT:      Head:      Comments: (+) bitemporal wasting     Mouth/Throat:      Mouth: Mucous membranes are dry.   Eyes:      General:         Right eye: No discharge.         Left eye: No discharge.   Cardiovascular:      Rate and Rhythm: Normal rate.   Pulmonary:      Effort: Pulmonary effort is normal.   Abdominal:      General: There is mild distension, generalized TTP. Dressing in place, ostomy beefy red with liquid  output  Genitourinary:     Comments: Deferred  Musculoskeletal:      Right lower leg: No edema.      Left lower leg: No edema.      Comments: (+) generalized muscle wasting   Skin:     General: Skin is warm and dry.   Neurological:      Mental Status: She is alert and oriented to person, place, and time.   Psychiatric:         Behavior: Behavior is cooperative.      Comments: Mood is depressed, affect congruent.  Tearful at times.    A total of 55 minutes were spent in chart preparation/chart review, direct patient contact, review of pertinent studies and lab work as well as in documentation.  This was all performed on the same day as the patient's visit.    Salvatore Marvel, NP-C, [facility]  Palliative Care  Pager: [phone]  Electronically signed by Jerral Ralph, MD at 12/19/2022  5:07 PM EST

## 2022-12-17 NOTE — Consults (Signed)
Formatting of this note is different from the original.  Nutrition Screen Notes    Nutrition Recommendations:   1. Continue current diet order    2. Nursing to document % meal intake in Flowsheets    3. RD following    Assessment/Comments:   Sue [name] is a 62 y.o.female admitted w/acute GI bleeding. Active problems include chronic HepC; HTN; vascular problem; pneumoperitoneum. Hx of colon ca; metastatic ovarian ca; peritoneal carcinomatosis. Colonic obstruction due to dense adhesions and tumor burden throughout the abdomen. Pt s/p exploratory laparotomy with lysis of adhesions, g-tube placement for decompression, and colostomy creation. Nutrition screen received for underwt BMI. Recent wt gain noted - 49.9 kg on admission (1/14); 49.2 kg one month ago (11/03/22); 43 kg three months ago (09/03/22). BMI has been in underwt range since 07/2022. Labs/meds/skin integrity reviewed.   Note pt has been npo/cl x 5 days. Recommend advancing diet when medically appropriate. If unable to advance diet or pt unable to tolerate diet advancement, recommend TPN. Recommend monitoring labs for refeeding syndrome.    Current Status:   1/24: Diet rx: reg (1/20). Pt visited to assess and encourage po intake. PO intake poor - consuming approx 25% of meals per Flowsheets. Offered nutritional supplements - pt prefers Ensure which is not on formulary - encouraged pt to ask family to bring in from home. Pt declined all supplements available at [facility]. Pt @ nutritional risk sec to poor po intake. Labs/meds/skin integrity reviewed.  Wt loss noted as compared to admission wt - pt currently weighs 46.7 kg; weighed 49.9 kg on admission.     Reason for Consult:   Reason for Consult: RD Screen      Reason for RD Consult: Low BMI    Malnutrition Criteria:         Nutrition Diagnosis: Altered GI Function related to extensive ca as evidenced by G tube for decompression; colostomy creation    Anthropometrics:   Height: '5\' 7"'$  (170.2 cm)  Weight: 102  lb 15.3 oz (46.7 kg)  Weight Change: -6.79  BMI (Calculated): 16.1 Kg/m2  IBW/kg (Calculated) FEMALE: 61.6 kg    Energy Needs:   Total Energy Estimated Needs: 1550 - 1810 cal (30 - 35 cal/kg) - based on cbw of 51.7 kg  Total Protein Estimated Needs: 62 - 78 gm pro (1.2 -1.5 gm/kg) - based on cbw of 51.7 kg    Reason for Admission:   Principal Problem:    Acute GI bleeding  Active Problems:    Chronic hepatitis C virus infection (CMS/HCC)    HTN (hypertension)    Vascular problem    Pneumoperitoneum    Pertinent Hospital issues/concerns related to nutritional status:   Past Medical History:   Diagnosis Date    Colon cancer (CMS/HCC)     Hepatitis C infection     treated 2015    Hypertension     Ovarian cancer (CMS/HCC)     PAD (peripheral artery disease) (CMS/HCC)        Nutrition Order Assessment:     Adequacy of Intake: Average Nutritional Intake: 0-25%    GI:   Gastrointestinal (WDL): Exceptions to WDL  Abdomen Inspection: Soft  Last BM Date: 12/16/22    Skin Assessment:   Edema: Peripheral Vascular  Peripheral Vascular (WDL): Within Defined Limits  Anti-Embolism Devices: Sequential compression devices, entire leg  Anti-Embolism Intervention: On  Education provided after anti embolism device refused : Yes  Cyanosis: None  Capillary Refill: Less than/equal to 2 seconds (  All extremities)  Pulses: R radial, L radial, R pedal, L pedal  Edema: Right lower extremity, Left lower extremity  RLE Edema: Mild pitting, slight indentation  LLE Edema: Mild pitting, slight indentation  PVS Additional Assessments: No    Wounds: Integumentary (WDL): Exceptions to WDL  Does patient have a wound?: Yes  Type of Wound (LDA): Wound    Pressure Ulcer(s): Type of Wound (LDA): Wound     Lab Assessment: Labs: Cr .58; Glu 106; Mg 1.5; Phos 1.9  No results found for: "HGBA1C"     Medications: Medications/Vitamin Supplements: MgOx; potassium & sodium phosphates    Allergy(ies): Penicillins, Hydrocodone, Orange fruit [citrus], Tomato, Demerol  hcl [meperidine], Ibuprofen, Morphine, Oxycodone, Strawberry, and Tylenol [acetaminophen]     Intervention:   Goals: Nutrition Goals: Gain Weight, Achieve Adequate Intake    Monitor and Evaluation:   Indicators: Indicators: Energy Intake, Weight Trends, Labs, Bowel Movements, Skin Integrity    Risk Status: Nutrition Risk: High Risk  Electronically signed by Lanelle Bal, RD at 12/17/2022 10:48 AM EST

## 2022-12-18 NOTE — Care Plan (Signed)
Formatting of this note might be different from the original.    Problem: PAIN - ADULT  Goal: Verbalizes/displays adequate comfort level or baseline comfort level  Outcome: Progressing  Flowsheets (Taken 12/18/2022 0745)  Verbalizes/Displays Adequate Comfort Level or Baseline Comfort Level:   Encourage patient to monitor pain and request assistance   Assess pain using appropriate pain scale   Administer analgesics based on type and severity of pain and evaluate response   Implement non-pharmacological measures as appropriate and evaluate response    Problem: SAFETY ADULT - FALL  Goal: Free from fall injury  Outcome: Progressing  Flowsheets (Taken 12/18/2022 0745)  Fall prevention/injury reduction interventions for ALL patients scoring 5 or less on the [place]:   Orient patient/family to surroundings   Maintain bed in lowest position with brakes locked   Place **call bell** and personal items in easy reach   **Respond promptly to call light   Keep ambulatory assistive devices within easy reach   Instruct all ambulatory patients to use skid proof footwear   Assess patient for use of side rails X 2   Include family in making plan of care for patient    Problem: Potential for Compromised Skin Integrity  Goal: Skin Integrity is Maintained or Improved  Outcome: Progressing  Flowsheets (Taken 12/18/2022 0745)  Skin Integrity is Maintained or Improved:   Assess and monitor skin integrity   Identify patients at risk for skin breakdown on admission and per policy   Relieve pressure to bony prominences   Avoid shearing   Keep skin clean and dry   Encourage use of lotion/moisturizer on skin   Monitor patient's hygiene practices    Electronically signed by Barb Merino, RN at 12/18/2022  7:46 AM EST

## 2022-12-18 NOTE — Progress Notes (Signed)
Formatting of this note is different from the original.  Images from the original note were not included.      General Surgery Progress Note    Reason for visit: Abdominal pain, dark stools    Diet:  Regular    DVT ppx: SCDs    Operation Performed:   Exploratory laparotomy with LOA, g-tube, colostomy (01/16 Dr. Rondel Jumbo)  Flex sigmoidoscopy by GI (01/16)    Subjective   No acute events overnight.   Patient reports doing okay.  Reports mild discomfort about abdomen. Reports decreased food intake, continues drinking fluids.  Pt reports nausea relieved with venting gastrostomy tube.  Reports feeling febrile, denies shortness of breath or chest pain.  Reports no liquid or solid ostomy output overnight.   Reports ambulating without difficulty.     Review of Systems  As above.     Vital Signs:  Vitals:    12/17/22 1857 12/17/22 1924 12/17/22 2241 12/18/22 0244   BP: 107/63 102/67 100/62 107/58   BP Location: Right arm Left arm Left arm Left arm   Pulse: 81 81 87 79   Resp: '18 18 16 16   '$ Temp: (!) 100.1 F (37.8 C) (!) 100.4 F (38 C) (!) 99.7 F (37.6 C) (!) 100.2 F (37.9 C)   TempSrc: Oral Oral Axillary Axillary   SpO2: 94% 92% 90% 93%   Weight:    93 lb 14.7 oz (42.6 kg)   Height:         Physical Exam  Constitutional:       General: She is awake. She is not in acute distress.     Appearance: She is underweight. She is chronically ill-appearing.   HENT:      Head: Normocephalic and atraumatic.      Eyes:      Pupils: Pupils are equal, round, and reactive to light.   Cardiovascular:      Rate and Rhythm: Normal rate and regular rhythm.      Pulses: Normal pulses.   Pulmonary:      Effort: Pulmonary effort is normal. No tachypnea, accessory muscle usage or respiratory distress.      Breath sounds: Normal breath sounds.      Comments: Room air.   Abdominal:      General: Abdomen is flat. The ostomy site is clean.      Tenderness: There is peri-incisional abdominal tenderness.      Comments: Abdomen soft.  Mild  distention, Midline abdominal incision, staples intact. JP drain with minimal output. Ostomy appliance intact, ostomy productive of mucous.  Stoma pink, proud.  G tube about left abdomen, clamped, vented at bedside with 50cc bilious output  Musculoskeletal:      Right lower leg: No edema.      Left lower leg: No edema.   Skin:     General: Skin is warm and dry.   Neurological:      Mental Status: She is alert and oriented to person, place, and time.      GCS: GCS eye subscore is 4. GCS verbal subscore is 5. GCS motor subscore is 6.   Results from last 7 days   Lab Units 12/16/22  0636 12/15/22  0926 12/12/22  0631   WBC k/mcL 3.3* 4.0* 6.3   HEMOGLOBIN gm/dL 9.7* 10.6* 11.4*   HEMATOCRIT % 28.1* 30.9* 32.4*   PLATELETS k/mcL 120* 117* 129*   NEUTROPHILS % MAN Rel %  --  55  --    NEUTROPHILS %  Rel % 58.5  --  60.2   LYMPHS PCT MAN Rel % 22.8 19 27.3   MONO PCT MAN Rel %  --  19  --    MONOS PCT Rel % 18.1  --  11.9   EOS PCT Rel % 0.2 1 0.3   BASOS PCT MAN Rel %  --  0  --    BASOS PCT Rel % 0.4  --  0.3     Results from last 7 days   Lab Units 12/16/22  0636 12/15/22  0644 12/14/22  0536   SODIUM mmol/L 138 136 140   POTASSIUM mmol/L 2.9* 2.9* 3.0*   CHLORIDE mmol/L 99* 97* 99*   CO2 mmol/L 33* 34* 35*   BUN mg/dL 8 4* 5*   CREATININE mg/dL 0.58 0.63 0.52   GLUCOSE mg/dL 106 100 96   CALCIUM mg/dL 7.9* 8.2* 8.3*     Intake/Output this shift/ 24hours:  I/O this shift:  In: 100 [P.O.:100]  Out: 2030 [Emesis/NG output:2000; Drains:30]    Intake/Output Summary (Last 24 hours) at 12/18/2022 0539  Last data filed at 12/18/2022 0418  Gross per 24 hour   Intake 500 ml   Output 2400 ml   Net -1900 ml     Assessment/Plan   62 y.o. 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 CT scan of the abdomen with rectosigmoid colon wall thickening and proximal small and large bowel dilatation concerning for colonic obstruction vs neoplasm. Patient underwent EGD and flex sigmoidoscopy by GI on 12/09/22. Post-procedurally, patient had worsening abdominal distension and a repeat KUB revealed pneumoperitoneum. General surgery was consulted and patient underwent exploratory laparotomy with lysis of adhesions, g-tube placement, and colostomy creation. Intraoperatively, patient found to have dense adhesions and tumor burden throughout the abdomen resulting in obstruction and had a suspected transverse colon perforation. 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      She is now s/p EGD, flex sig, with takeback for ex-lap, lysis of adhesions, G-tube placement, and colostomy creation (1/16), found to have dense tumor burden and suspected transverse colon perforation, with poor prognosis and decreased ostomy output likely secondary to disease progression.  Pt has been hemodynamically normal.  Intermittently febrile, continue to monitor.  Continue to monitor ostomy output.  Plan to remove JP drain, staples as indicated.    - Regular diet as tolerated  - Pain control per palliative care  - Monitor drain output  - Palliative care consulted, appreciate recs  - Hem/onc consulted, appreciate recs  - PT/OT  - If patient nauseated, open G-tube to gravity  - Fever work-up   - CXR: New airspace opacity posterior right lower lobe possible pneumonia  - UA: no infectious process  - blood cultures:  No growth to date  - C.diff - Neg  Dispo:  Pending patient decision    Principal Problem:    Acute GI bleeding  Active Problems:    Chronic hepatitis C virus infection (CMS/HCC)    HTN (hypertension)    Vascular problem    Pneumoperitoneum    Laurell Roof,  DO  General Surgery, PGY-1  ACS Pager [phone]    Face  to Face Patient Counseling / Coordinating Care >50% of Encounter Time:   Total Encounter Time (in minutes):    Portions of the record may have been created with voice recognition software. Occasional wrong word or "sound a like" substitutions may have occurred due to the inherent limitations of voice recognition software. Read the chart carefully and recognize, using context, where substitutions have occurred.    Electronically signed by Minda Ditto, DO at 12/18/2022  1:17 PM EST    Associated attestation - Minda Ditto, DO - 12/18/2022  1:17 PM EST  Formatting of this note is different from the original.  I saw and evaluated the patient, participating in the key portions of the service.  I reviewed the resident?s note.  I agree with the resident?s findings and plan.     Spoke to palliative team to tailor outpatient pain regimen until patient sees oncology at which point oncology plans to manage her pain and offer treatment. Patient requesting discharge tomorrow. Will remove staples, remove drain and plan discharge tomorrow.

## 2022-12-18 NOTE — Progress Notes (Signed)
Formatting of this note might be different from the original.  Care Coordinator Discharge Plan     Discharge Plan    Discharge Planning  Patient wishes to be discharged to: Home-Home care Svc (Pt will dc home with hh provide by [facility] for ostomy needs. Pt is unsure if she wants family to provide transport of if she will use a medicaid taxi. Cm will continue to follow for dc needs)  Referral made to: DC Planning  Is Discharge Anticipated in 48 Hours? : Yes  Discharge Plan : Home, new meds, Home health  Barriers to discharge:Primary Caregiver is willing and able to perform any and all duties?: Yes (Spouse)  Who will patient reside with? : Spouse/significant other  Support Systems: Spouse/significant other, Family members  DME equipment needs: No equipment needed  Services needing prior authorization: Medications, Home Health  Freedom of Choice Was Discussed: Yes  Written Resource List Provided : Patient Declined  Choice of providers: Yes (any hh agency)  Patient/Family/POA directed to Medicare Compare site for review of post-acute facility/agency quality data : Yes  Case Management assessment reviewed with the patient or primary caregiver?: Yes (Reviewed with patient who is alert and oriented)  How ready does the patient/family feel they are prepared for discharge?: Prepared  Does the patient need discharge transport arranged?: No (Husband will transport home)  Next review date: 12/19/22  Case Management [place] Patient Discharge Ready : Yes     Medicare IM  Date discharge Medicare IM given: 12/16/22  Medicare IM Given Time: 1610  Discharge Medicare IM provided by: Enid Skeens [facility])  Discharge Medicare IM given to: Patient  Discharge Medicare IM given via: Face to Face at Bedside    Discharge Interventions  Discharge Activities: Chart review less than 30 minutes, Ensure discharge transportation plans were discussed and agreed upon  Home Services: Home Health  Teach Back Provided : Discharge  Plan        [facility]    Electronically signed by Constance Goltz at 12/18/2022  4:15 PM EST

## 2022-12-18 NOTE — Progress Notes (Signed)
Formatting of this note is different from the original.  PALLIATIVE PROGRESS NOTE    RECOMMENDATIONS/PLAN:  I. Pain and Sx Management   - Dilaudid '1mg'$  IV q 4 hrs PRN moderate or severe pain   - Start Fentanyl 12.65mg TD patch today, discussed with Dr. NCristina Gong(oncologist) who is willing to assist with refills until next appt with him   - Vent G tube to gravity for nausea, can also take Zofran '4mg'$  ODT q 6 hrs PRN  II. GOC   - FULL   - Pt would like to try to pursue more cancer treatment, but is fearful that this may not be possible.  She is planning to go home and see how she does over the next few weeks.  She has a f/u appointment with Dr. NCristina Gongon 01-12-23.  III. Spiritual/Psychosocial   - A&Ox3, participatory in conversations, demonstrates understanding of situation, is consistent in responses and preferences regarding treatment, demonstrates comprehension of associated benefits/burdens/alternatives and rationale for choices.   - Pt completed "Appointment of a Healthcare Agent" naming her husband CJuanda Crumbleas primary agent and 2 of her children as secondary agents.   - PC and spiritual care for support as needed  IV. Disposition   - HD #11   - Home with [facility], will inquire about palliative RN visit through home health given that she resides outside of [facility]'s home-based palliative service area   - Pt is at elevated risk for readmission, recommend early consult to palliative care in the event that this occurs    Discussed with BRaiford Noble(primary RN), Dr. NCristina Gong(oncologist), and Dr. 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  [facility] several day history of black, tarry stools and N/V. She was admitted under hospitalist team, evaluated by GI and underwent upper endoscopy which showed gastritis and flex/sig which showed severe recto-sigmoid stricture that could not be transversed. Biopsies were obtained and sent.  Pt was noted to have increased abdominal distension post-procedure.  Follow-up imaging showed development of frank pneumoperitoneum c/w perforated viscus. Surgeon spoke with pt, re: overall condition, high risk for surgery, and likely poor prognosis with pt stating desire to move forward with ex-lap. Ex-lap was performed - found dense adhesions and tumor burden throughout abdomen resulting in obstruction. Pt required significant lysis of adhesions but was able to have ostomy created and g-tube placed. Surgical team spoke with pt again post-op, re: findings, her systemic illness, and trajectory for recovery during which time pt became distressed. Pt had been offered hospice in the past by her oncologist (Dr. NCristina Gongwith PCI) however had previously expressed her wish to continue to fight the cancer. She was agreeable to further discussions with PC team, re: [place]  Her oncologist has seen her while inpatient.  Based on recent imaging and CA125 results, as well as relatively new start of current therapy, findings on ex-lap did not necessarily indicate failure of current treatment and, if pt desires/functional status supports this, she could continue with treatment outpatient.  I met with Ms. GReifschneideron 12-11-22 and GOC are established as noted above - pt is aware of  her prognosis and has consistently endorsed her desire for ongoing medical management and disease-directed therapy.  She has been agreeable to ongoing PC support for symptom management and psychosocial support as needed.    Interval History: HD #11.  EMR and MAR reviewed.  Continues to require frequent PRN Dilaudid IV for pain management.  Nausea  is improved with venting G tube and Zofran.    ASSESSMENT:  Patient Active Problem List   Diagnosis    Protein calorie malnutrition (CMS/HCC)    Ovarian CA, unspecified laterality (CMS/HCC)    At high risk for falls    Acute GI bleeding    Chronic hepatitis C virus infection (CMS/HCC)    HTN (hypertension)    Vascular problem    Pneumoperitoneum     SUBJECTIVE:   No visitors at time of PC NP exam.  Pt was awake and alert, mood flat with congruent affect.  ROS completed - primary concern remains pain (abdomen) and nausea.    Pt asked me to sit at the bedside, stated she was worried about going home and "feeling afraid".  She was agreeable to more discussions regarding her health care plans.  - worries that she will go home and "something will go wrong"  - has family support "but they're all mostly men" and does not want to ask them for help with things like cleaning herself up  - husband is willing to help, but she reports he "gets himself all worked up and then worries me".  She told him to go home on 12-16-22 and not to come back to the hospital so that she could have some peace.  - discussed female support system which includes a daughter who is local and a daughter in [place], neither of whom are readily available due to either distance or family needs  - discussed plan of care if she were to have a pain crisis or issues with G tube which would be to reach out to her doctors and, if it were not possible for her doctors to see her, to return to the hospital.  If that happens, palliative care can always see her again in the hospital setting.  - pt is afraid to come back to the hospital but willing to do so if there are no other options, stated again that she came here for "just one thing, and look what happened, I've been here almost 2 weeks"  - is afraid that if she came back to the hospital she would be exposed to other sick people    Acknowledged her worry about the lack of support and possible pain crisis.  Pt  brought up the subject of hospice today.  - confirmed that she was in agreement to talk about hospice  - discussed expected support that hospice provides  - agreed that she could always choose to revoke hospice  - hospice provides the best supportive care and, if she were feeling significantly improved and wanted to re-investigate further cancer therapy she would always have the freedom of choice to revoke hospice and pursue more treatment  - alternatively, if she continued to feel poorly and was not improving as she hoped, she would already have hospice on board to support her    Pt stated her appreciation for that info.  She wants to have some time to think things.  I told her that we will let her tell us when she feels like she is ready for that.  She is aware that I  will not be in the hospital tomorrow but that my colleague, Ms. Joanie Coddington NP, is going to check on her and that if she has any questions, she can ask Desiree.    Discussed the support that she would have available with home health.  Since she lives outside of the home-based palliative team's service area, I can check about requesting a palliative nurse visit through home health.  Pt stated she would like that.  She asked about the fentanyl patch.  Education provided on this medication and that it will take about 16 hours before it starts to kick in, 24 hours before peak effect.  Pt would like to try the Fentanyl patch at this time and was agreeable to start it today.  Will also add some menthol cream for low back pain.    ROS:   Review of Systems - Negative except abdominal pain, low back pain, nausea, low appetite    OBJECTIVE:   Physical Exam  Vitals and nursing note reviewed.   Constitutional:       General: She is awake, fatigued.      Appearance: She is underweight.      Comments: African American female   HENT:      Head:      Comments: (+) bitemporal wasting     Mouth/Throat:      Mouth: Mucous membranes are moist.   Eyes:      General:          Right eye: No discharge.         Left eye: No discharge.   Cardiovascular:      Rate and Rhythm: Normal rate.   Pulmonary:      Effort: Pulmonary effort is normal.   Abdominal:      General: Dressing in place, G tube draining bilious output  Genitourinary:     Comments: Deferred  Musculoskeletal:      Right lower leg: No edema.      Left lower leg: No edema.      Comments: (+) generalized muscle wasting   Skin:     General: Skin is warm and dry.   Neurological:      Mental Status: She is alert and oriented to person, place, and time.   Psychiatric:         Behavior: Behavior is cooperative.      Comments: Mood is flat, affect congruent.    A total of 55 minutes were spent in chart preparation/chart review, direct patient contact, review of pertinent studies and lab work as well as in documentation with an additional 16 minutes devoted to advanced care planning today.  This was all performed on the same day as the patient's visit.    Salvatore Marvel, NP-C, [facility]  Palliative Care  Pager: [phone]    Electronically signed by Jerral Ralph, MD at 12/19/2022  5:07 PM EST

## 2022-12-18 NOTE — Care Plan (Signed)
Formatting of this note might be different from the original.    Problem: PAIN - ADULT  Goal: Verbalizes/displays adequate comfort level or baseline comfort level  Outcome: Progressing    Problem: INFECTION - ADULT  Goal: Absence of infection during hospitalization  Outcome: Progressing  Goal: Absence of fever/infection during anticipated neutropenic period  Outcome: Progressing    Problem: SAFETY ADULT - FALL  Goal: Free from fall injury  Outcome: Progressing    Electronically signed by Georgette Dover, RN at 12/19/2022  2:59 AM EST

## 2022-12-19 NOTE — Progress Notes (Signed)
Formatting of this note might be different from the original.  Care Coordinator Discharge Plan     Discharge Plan    Discharge Planning    Patient wishes to be discharged to: Home-Home care Svc (Pt will dc home with hh provide by [facility] for ostomy needs. Pt is unsure if she wants family to provide transport of if she will use a medicaid taxi. Cm will continue to follow for dc needs)  Referral made to: DC Planning  Is Discharge Anticipated in 48 Hours? : Yes  Discharge Plan : Home, new meds, Home health    Barriers to discharge:  (GI Bleed, New Colostomy, CA metastaic  12/10/22 Initiated fever work-up CXR, UA, blood cultures, c.diff ( 12/15/22) Per Gen Surg 12-19-22 Pending patient decision. Pt's husband had a heart attack two days ago and he was going to be her primary care giver.)  Primary Caregiver is willing and able to perform any and all duties?: Yes (Spouse)  Who will patient reside with? : Spouse/significant other  Support Systems: Spouse/significant other, Family members  DME equipment needs: No equipment needed  Services needing prior authorization: Medications, Home Health  Freedom of Choice Was Discussed: Yes  Written Resource List Provided : Patient Declined  Choice of providers: Yes (any hh agency)  Patient/Family/POA directed to Medicare Compare site for review of post-acute facility/agency quality data : Yes  Case Management assessment reviewed with the patient or primary caregiver?: Yes (Reviewed with patient who is alert and oriented)  How ready does the patient/family feel they are prepared for discharge?: Prepared  Does the patient need discharge transport arranged?: No (Husband will transport home)  Next review date: 12/22/22  Case Management [place] Patient Discharge Ready : Yes     Medicare IM  Date discharge Medicare IM given: 12/16/22  Medicare IM Given Time: 7628  Discharge Medicare IM provided by: Enid Skeens [facility])  Discharge Medicare IM given to: Patient  Discharge Medicare IM  given via: Face to Face at Bedside    Discharge Interventions  Discharge Activities: Chart review less than 30 minutes, Ensure discharge transportation plans were discussed and agreed upon  Home Services: Home Health  Teach Back Provided : Discharge Plan        [facility]    Electronically signed by Constance Goltz at 12/19/2022 12:05 PM EST

## 2022-12-19 NOTE — Care Plan (Signed)
Formatting of this note might be different from the original.    Problem: PAIN - ADULT  Goal: Verbalizes/displays adequate comfort level or baseline comfort level  Outcome: Progressing    Problem: INFECTION - ADULT  Goal: Absence of infection during hospitalization  Outcome: Progressing    Problem: SAFETY ADULT - FALL  Goal: Free from fall injury  Outcome: Progressing    Electronically signed by Waldo Laine, RN at 12/19/2022  9:09 AM EST

## 2022-12-19 NOTE — Progress Notes (Signed)
Formatting of this note is different from the original.  Images from the original note were not included.      General Surgery Progress Note    Reason for visit: Abdominal pain, dark stools    Diet:  Regular    DVT ppx: SCDs    Operation Performed:   Exploratory laparotomy with LOA, g-tube, colostomy (01/16 Dr. Rondel Jumbo)  Flex sigmoidoscopy by GI (01/16)    Subjective   No acute events overnight. Patient reported that she does not want to go home today. She reported that her husband, who has been at bedside during her entire hospitalization, went to have lunch with his son and had a heart attack and is currently at [place]. This is his 4th heart attack and apparently "the stent was too big to be put in", but that "they might send him home this weekend". Understandably the patient was very distraught by this and does not want to go home yet. Her husband was going to be her primary caregiver if going home in addition to home health.    Patient otherwise reported output from her ostomy overnight, but needed to vent her g-tube as well. Pain moderate, she is urinating without difficulty. Complaining of being cold, continues to drink liquids with intermittent venting of g-tube. Denies shortness of breath or chest pain.  Reports ambulating without difficulty.     Review of Systems  As above.     Vital Signs:  Vitals:    12/19/22 0314 12/19/22 0540 12/19/22 0550 12/19/22 0643   BP: 98/52   105/67   BP Location: Left arm   Left arm   Pulse: 86   83   Resp: 18   18   Temp: (!) 100.4 F (38 C) (!) 100.4 F (38 C)     TempSrc: Oral Oral     SpO2: 95%   96%   Weight:   93 lb (42.2 kg)    Height:         Physical Exam  Constitutional:       General: She is awake. She is not in acute distress.     Appearance: She is underweight. She is chronically ill-appearing.   HENT:      Head: Normocephalic and atraumatic.      Eyes:      Pupils: Pupils are equal, round, and reactive to light.   Cardiovascular:       Rate and Rhythm: Normal rate and regular rhythm.      Pulses: Normal pulses.   Pulmonary:      Effort: Pulmonary effort is normal. No tachypnea, accessory muscle usage or respiratory distress.      Breath sounds: Normal breath sounds.      Comments: Room air.   Abdominal:      General: Abdomen is flat. The ostomy site is clean.      Tenderness: There is peri-incisional abdominal tenderness.      Comments: Abdomen soft.  Mild distention, Midline abdominal incision, staples intact. JP drain with minimal output. Ostomy appliance intact, ostomy productive of mucous.  Stoma pink, proud.  G tube about left abdomen, clamped, vented at bedside with 50cc bilious output  Musculoskeletal:      Right lower leg: No edema.      Left lower leg: No edema.   Skin:     General: Skin is warm and dry.   Neurological:      Mental Status: She is alert and oriented to person,  place, and time.      GCS: GCS eye subscore is 4. GCS verbal subscore is 5. GCS motor subscore is 6.   Results from last 7 days   Lab Units 12/16/22  0636 12/15/22  0926   WBC k/mcL 3.3* 4.0*   HEMOGLOBIN gm/dL 9.7* 10.6*   HEMATOCRIT % 28.1* 30.9*   PLATELETS k/mcL 120* 117*   NEUTROPHILS % MAN Rel %  --  55   NEUTROPHILS % Rel % 58.5  --    LYMPHS PCT MAN Rel % 22.8 19   MONO PCT MAN Rel %  --  19   MONOS PCT Rel % 18.1  --    EOS PCT Rel % 0.2 1   BASOS PCT MAN Rel %  --  0   BASOS PCT Rel % 0.4  --      Results from last 7 days   Lab Units 12/16/22  0636 12/15/22  0644 12/14/22  0536   SODIUM mmol/L 138 136 140   POTASSIUM mmol/L 2.9* 2.9* 3.0*   CHLORIDE mmol/L 99* 97* 99*   CO2 mmol/L 33* 34* 35*   BUN mg/dL 8 4* 5*   CREATININE mg/dL 0.58 0.63 0.52   GLUCOSE mg/dL 106 100 96   CALCIUM mg/dL 7.9* 8.2* 8.3*     Intake/Output this shift/ 24hours:  No intake/output data recorded.    Intake/Output Summary (Last 24 hours) at 12/19/2022 0831  Last data filed at 12/19/2022 0644  Gross per 24 hour   Intake 680 ml   Output 665 ml   Net 15 ml     Assessment/Plan   62 y.o.  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 CT scan of the abdomen with rectosigmoid colon wall thickening and proximal small and large bowel dilatation concerning for colonic obstruction vs neoplasm. Patient underwent EGD and flex sigmoidoscopy by GI on 12/09/22. Post-procedurally, patient had worsening abdominal distension and a repeat KUB revealed pneumoperitoneum. General surgery was consulted and patient underwent exploratory laparotomy with lysis of adhesions, g-tube placement, and colostomy creation. Intraoperatively, patient found to have dense adhesions and tumor burden throughout the abdomen resulting in obstruction and had a suspected transverse colon perforation. 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    She is now s/p EGD, flex sig, with takeback for ex-lap, lysis of adhesions, G-tube placement, and colostomy creation (1/16), found to have dense tumor burden and suspected transverse colon perforation, with poor prognosis and decreased ostomy output likely secondary to disease progression.  Pt has been hemodynamically normal.  Intermittently febrile, continue to monitor.  Continue to monitor ostomy output.  Plan to remove JP drain, staples prior to discharge home with home health.    - Regular diet as tolerated  - Pain control per palliative care  - Monitor drain output  - Palliative care consulted, appreciate recs  - Hem/onc consulted, appreciate recs  - PT/OT  - If patient nauseated, open G-tube to gravity  - Fever work-up   - CXR: New airspace opacity posterior right lower lobe  possible pneumonia  - UA: no infectious process  - blood cultures:  No growth to date  - C.diff -  Neg  Dispo:  Pending patient decision. Patient's husband had a heart attack two days ago and he was going to be her primary care giver. Unclear on his prognosis at this time.     Acie Fredrickson, MD   PGY-3 General Surgery   ACS Pager [phone]    Principal Problem:    Acute GI bleeding  Active Problems:    Chronic hepatitis C virus infection (CMS/HCC)    HTN (hypertension)    Vascular problem    Pneumoperitoneum    Face to Face Patient Counseling / Coordinating Care >50% of Encounter Time:   Total Encounter Time (in minutes):    Portions of the record may have been created with voice recognition software. Occasional wrong word or "sound a like" substitutions may have occurred due to the inherent limitations of voice recognition software. Read the chart carefully and recognize, using context, where substitutions have occurred.    Electronically signed by Minda Ditto, DO at 12/19/2022 11:51 AM EST    Associated attestation - Minda Ditto, DO - 12/19/2022 11:51 AM EST  Formatting of this note is different from the original.  I saw and evaluated the patient, participating in the key portions of the service.  I reviewed the resident?s note.  I agree with the resident?s findings and plan.     Patient with chills and shakes. Patient's husband who has been visiting found to have the flu. Will check for flu which may explain her low grade temperatures.

## 2022-12-19 NOTE — Telephone Encounter (Signed)
Formatting of this note might be different from the original.  Canceled treatment for next week. Called and left voicemail with patient in regards to message below. Advised patient to call with any questions or concerns.     Buford Dresser, CMA    ----- Message from Doreatha Lew, MD sent at 12/18/2022  5:41 PM EST -----  Regarding: RE: Treatment    Yes thanks    ----- Message -----  From: Buford Dresser, CMA  Sent: 12/18/2022   8:30 AM EST  To: Audree Camel, RN; Mertie Moores, RN; #  Subject: RE: Treatment                                    So should I go ahead and cancel treatment for next week?  ----- Message -----  From: Doreatha Lew, MD  Sent: 12/17/2022  12:52 PM EST  To: Audree Camel, RN; Mertie Moores, RN; #  Subject: RE: Treatment                                    She will need postoperative recovery. I will see her in the clinic and decide the timing after  Thanks   ----- Message -----  From: Mertie Moores, RN  Sent: 12/17/2022  10:19 AM EST  To: Audree Camel, RN; Buford Dresser, CMA; #  Subject: Treatment                                        Good Morning Dr. Cristina Gong,    I see that Mrs. Marcoux is in the hospital w/ noted progression and possible ostomy. Does she need a hospital followup prior to tx next week.    Thanks,  [place]le [place]      Electronically signed by Buford Dresser, CMA at 12/19/2022  8:00 AM EST

## 2022-12-19 NOTE — Progress Notes (Signed)
Formatting of this note might be different from the original.  ACS team notified about the patient's SBP in the 80's. We arrived at her bedside and she was saturated in the 60's on room air and minimally responsive to questioning. O2 sat confirmed on two different devices, and she was placed on 10L NC saturating in 60's-70's, HR in 120's, BP 80's/60's, temp of 102.5, still minimally responsive. 1L LR bolus, stat CXR, EKG, sepsis labs, istat, and ABG ordered. G-tube opened to gravity in Foley bag with minimal out bilious output. Respiratory therapy paged and rapid response called. Patient switched to 15L on non-rebreather with O2 sat in the 80's. Patient became more responsive and complained of shortness of breath and abdominal pain, but denied chest pain. SICU attending called. Patient switched to high flow NC with improvement in O2 sat to 90's. Bed transfer called for SICU. ABG with metabolic alkalosis, K of 2.6, lactate of 1.4. CXR with evidence of possible PNA. Patient complaining of GERD symptoms and SOB. IVF changed to NS, foley placement ordered. Vancomycin and Zosyn ordered. Order placed to access mediport.    At this time our biggest concern is for aspiration pneumonia along side dehydration due to malignant bowel obstruction with constant venting of her g-tube in unmeasured amounts. Full sepsis work up pending. Will continue to monitor respiratory status and resuscitation in the SICU. Once the patient was alert and responsive I confirmed her code status, which is Full Code. I attempted to reach the three family members listed in her contacts (husband, son, and daughter), but was unable to reach them to update them on her status. Per a prior conversation with the patient this morning, her husband is likely at [place] having had an MI two days ago.     Acie Fredrickson, MD   PGY-3 General Surgery   ACS Pager [phone]    Electronically signed by Minda Ditto, DO at 12/22/2022 10:35 AM  EST    Associated attestation - Minda Ditto, DO - 12/22/2022 10:35 AM EST  Formatting of this note is different from the original.  I saw and evaluated the patient, participating in the key portions of the service.  I reviewed the resident?s note.  I agree with the resident?s findings and plan.

## 2022-12-19 NOTE — Unmapped (Signed)
Formatting of this note is different from the original.      Rapid Response    Makell Drohan [birth year]   12/19/2022    Rapid response called at 16:50    Reason rapid response was called Spo2 of 60%, decreased BP, temp 102    Temp:  [98.8 F (37.1 C)-102.8 F (39.3 C)] 102.8 F (39.3 C)  Heart Rate:  [77-126] 116  Resp:  [16-20] 20  BP: (70-133)/(41-84) 70/41  FiO2 (%):  [100 %] 100 %    Airway: Not at this time    Respiratory Intervention: RT obtained an ABG and placed pt on HFNC     12/19/22 1732   Oxygen Therapy/Pulse Ox   $ Oxygen Therapy Supplemental oxygen   O2 Delivery Method High flow nasal cannula   $ High Flow Device Used (Initial/Subsequent Daily) Yes   High Flow Set Up completed Yes   O2 Flow Rate (L/min) 40 L/min   FiO2 (%) 100 %   Patient Activity At rest   Safety Instructions No   Continuous Pulse Ox Monitoring  Completed     Transported to Higher Level of Care: Pt planned to transport to SICU    Electronically Signed By: Cloria Spring, RRT 12/19/2022 5:35 PM     Electronically signed by Cloria Spring, RRT at 12/19/2022  5:36 PM EST

## 2022-12-19 NOTE — Nursing Note (Signed)
Formatting of this note might be different from the original.  Pt arrived to the unit after rapid response on the floor. Arrived AO x 4, VSS see flow sheet. NS bolus finished upon arrival and fluids started per MAr. Four eye skin assessment showed open area to sacrum, MAI, LLQ ostomy. Neuro completely intact, lungs coarse bilat. And BP and HR improved after bolus. Dr. Alexander Mt notified of critical K of 2.6 and orders placed. Pt not candidate for ERP.    Satira Mccallum, RN    Electronically signed by Satira Mccallum, RN at 12/19/2022  7:32 PM EST

## 2022-12-19 NOTE — Case Communication (Signed)
Formatting of this note might be different from the original.  1638 CNA called this RN for pts VSS  T102.5 HR 107  RR 18 BP 75/48, O2 is 62 on RA    It happens that MD Earleen Newport and his team is on the hallway, This RN get their attention to see pt. MD and team went to bedside with this RN and CNA James VSS retaken, Temp 102.5 HR 119, RR14, 67 O2 on RA BP 81/49, CN Beth went to pt room as well. Placed pt on NC 10L O2 level just went up to 74%. Pt lethargic, can open her eyes when calling her name. MD placed her to non rebreather and switch to high flow by RT.  Narcan was ordered and given by CN Beth. Rapid Response was called as well.     General Surgery team and MD Lofgren on the bedside. 1 L LR bolus ordered but then Dc'd, Changed to 1L NS Bolus. CXRAY, Lactic, BloodCulture x2, Procal, CBC, CMP, ABG,EKG, UA and foley insertion was ordered STAT. G-Tube was connected to bag on low gravity per order, draining greenish output. They have ordered to transfer pt to higher level of care.     1L of NS bolus was started, Labs were done by rapid RN and Phlebotomist, EKG were done, Cxray were done MD aware about result, Foley inserted, noted for tea colored urine,  UA was sent to lab as well.     1820 Pt was Transferred to room 2417, bedside report done with B Hudgins, RN. Fentanyl patch removed by pt and wasted with RN Hudgins. Per RN Hudgins, they will call family member to made them aware that pt was transferred on their unit. All belongings was sent to pts room by CNA Jeneen Rinks.   Electronically signed by Waldo Laine, RN at 12/19/2022  7:37 PM EST

## 2022-12-19 NOTE — Progress Notes (Signed)
Formatting of this note might be different from the original.  Palliative Care Progress Note    RECOMMENDATIONS/PLAN:  I. Medical   - Acute GI Bleeding   - Chronic Hep C   - Hypertension   - Pneumoperitoneum     II. Symptoms:    - Dilaudid '1mg'$  IV q 4 hrs PRN moderate or severe pain              - Continue Fentanyl 12.68mg TD patch, discussed with Dr. NCristina Gong(oncologist) who is willing to assist with refills until next appt with him              - Vent G tube to gravity for nausea, can also take Zofran '4mg'$  ODT q 6 hrs PRN List symptoms with plan for management    III. Advanced Care Plan    - Full Code Status  - Has HCPOA on file    IV. Psychosocial/Spiritual Plan for Support :   - Patient's decision maker would be her husband Sue [name] if unable to make decisions  - Chaplain available as requested     V. Goals and Values for Medical Care   - Continue to support patient and family   - Provide adequate control of symptoms    - She would like to pursue more cancer treatment, but is fearful that may not be possible. She's planning on going home and see how she does over the next few weeks. She has a follow up appointment with Dr. NCristina Gongon 01/12/2023.     VI. Transition Plan for Future Living Situation   - Patient's husband had a heart attack and also has the flu, pt likely not going to [place] over the weekend because he's her primary caregiver.     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 [facility] several day history of black, tarry stools and N/V. She was admitted under hospitalist team, evaluated by GI and underwent upper  endoscopy which showed gastritis and flex/sig which showed severe recto-sigmoid stricture that could not be transversed. Biopsies were obtained and sent.  Pt was noted to have increased abdominal distension post-procedure.  Follow-up imaging showed development of frank pneumoperitoneum c/w perforated viscus. Surgeon spoke with pt, re: overall condition, high risk for surgery, and likely poor prognosis with pt stating desire to move forward with ex-lap. Ex-lap was performed - found dense adhesions and tumor burden throughout abdomen resulting in obstruction. Pt required significant lysis of adhesions but was able to have ostomy created and g-tube placed. Surgical team spoke with pt again post-op, re: findings, her systemic illness, and trajectory for recovery during which time pt became distressed. Pt had been offered hospice in the past by her oncologist (Dr. NCristina Gongwith PCI) however had previously expressed her wish to continue to fight the cancer. She was agreeable to further discussions with PC team, re: [name]  Her oncologist has seen her while inpatient.  Based on recent imaging and CA125 results, as well as relatively new start of current therapy, findings on ex-lap did not necessarily indicate failure of current treatment and, if pt desires/functional status supports this, she could continue with  treatment outpatient.  I met with Sue [name] on 12-11-22 and GOC are established as noted above - pt is aware of her prognosis and has consistently endorsed her desire for ongoing medical management and disease-directed therapy.  She has been agreeable to ongoing PC support for symptom management and psychosocial support as needed.    ASSESSMENT:  Acute GI bleeding    SUBJECTIVE:   1027: Entered patient's room, working with wound care RN, told her I'd return. Asked if there was anything I could do for her right now, she wanted pain medications. Told RN [name] patient wanted something for pain.     1404: Returned to  patient's bedside, she's appears to be sleep but talking in her sleep. Called out her name and she answered, told her I was just checking on her and I work with Sonia Baller from [place]. She went back to sleeping and talking. Asked her if she was awake, she said yes, she talks in her sleep. Then she went back to sleep. Sat with her for a little while longer, she continue sleeping and talking to someone when she was sleep about "wanting to get better they needed to stop taking cold medicine and put some pants on". Told her I'd come back and check on her later, she thanked me for coming.     Discussed patient with [name], RN (primary nurse) and Felicita Gage (case management).     ROS:   Review of Systems   All other systems reviewed and are negative.    OBJECTIVE:   Physical Exam  Constitutional:       Appearance: She is underweight.   HENT:      Head: Normocephalic and atraumatic.      Right Ear: External ear normal.      Left Ear: External ear normal.      Nose: Nose normal.      Mouth/Throat:      Mouth: Mucous membranes are moist.   Cardiovascular:      Rate and Rhythm: Normal rate and regular rhythm.      Pulses: Normal pulses.   Pulmonary:      Effort: Pulmonary effort is normal.   Abdominal:      Palpations: Abdomen is soft.   Musculoskeletal:      Right lower leg: No edema.      Left lower leg: No edema.   Skin:     General: Skin is warm and dry.     A total of 25 minutes were spent in chart preparation/chart review, direct patient contact, review of pertinent studies and lab work as well as in documentation.  This was all performed on the same day as the patient's visit    Thank you for allowing [place] to participate in the care of this patient    Merri Ray, MSN, FNP-BC, AGACNP-BC  Palliative Care  Pager: [phone]        Electronically signed by Jerral Ralph, MD at 12/19/2022  5:07 PM EST

## 2022-12-19 NOTE — Progress Notes (Signed)
Formatting of this note is different from the original.  Vancomycin Progress Note    Assessment:   Sue [name] is being treated for S. aureus pneumonia with a target AUC of 400-600 (aiming for 500 mghr/L).     CrCl Calculation: 16.8 mL/min based on a serum creatinine of 2.33 mg/dL    Vancomycin is being initiated.  Loading dose: 1,000 mg once   Maintenance dose: 500 mg q24h    Estimated PK Parameters:   Estimated Ke (hr ^-1): 0.024  Estimated half-life (hr): 28.88 hr  Estimated vancomycin CL (L/hr): 0.914  Estimated Vd: 37.558     Plan:    1.  Will dose the patient using scheduled dosing.  2.  Will start the dose at 500 mg q24h.  3.  Will plan to order a peak and a trough before the fourth dose or sooner if significant change in renal function.     Predicted Results:  Predicted vancomycin peak (mcg/mL): 30.04  Predicted vancomycin trough (mcg/mL): 17.3  Predicted AUC 24hr (mghr/L): 555    Subjective & Objective   Subjective:  Sue [name] is a 62 y.o. female who is being treated for S. aureus pneumonia. [name] is allergic to penicillins, hydrocodone, orange fruit [citrus], tomato, demerol hcl [meperidine], ibuprofen, morphine, oxycodone, strawberry, and tylenol [acetaminophen]..    Past Medical History:   Diagnosis Date    Colon cancer (CMS/HCC)     Hepatitis C infection     treated 2015    Hypertension     Ovarian cancer (CMS/HCC)     PAD (peripheral artery disease) (CMS/HCC)      Past Surgical History:   Procedure Laterality Date    COLON SURGERY      OVARY SURGERY      PR SIGMOIDOSCOPY FLX W/BIOPSY SINGLE/MULTIPLE  12/09/2022    Procedure: SIGMOIDOSCOPY WITH BIOPSY;  Surgeon: Loel Dubonnet, MD;  Location: [facility];  Service: Gastroenterology     [name] is in the hospital currently for:  Principal Problem:    Acute GI bleeding  Active Problems:    Chronic hepatitis C virus infection (CMS/HCC)    HTN (hypertension)    Vascular problem    Pneumoperitoneum    Vitals  BP: 95/64  Temp: (!)  38.4 C (101.2 F)  Temp Source: Oral  Heart Rate: 86  Resp: 18  SpO2: 97 %  Height: '5\' 7"'$  (170.2 cm)  Weight: 93 lb (42.2 kg)     Objective:  Labs:   Results from last 7 days   Lab Units 12/19/22  1728 12/19/22  1712 12/16/22  0636 12/15/22  0926   BUN mg/dL '17 16 8  '$ --    CREATININE mg/dL 2.33* 2.60* 0.58  --    WBC k/mcL 5.9  --  3.3* 4.0*     Estimated Creatinine Clearance: 16.9 mL/min (A) (by C-G formula based on SCr of 2.33 mg/dL (H)).         Microbiology Results       Procedure Component Value Units Date/Time    Blood Culture [[id number]] Collected: 12/19/22 1728    Specimen: Blood, Venous Updated: 12/19/22 1736    Blood Culture [[id number]] Collected: 12/19/22 1728    Specimen: Blood, Venous Updated: 12/19/22 1736    Influenza A&B Antigens [[id number]]  (Normal) Collected: 12/19/22 1229    Specimen: SWAB from Nose Updated: 12/19/22 1307     Influenza Viral Antigen A Negative     Influenza Viral Antigen B Negative  Comment: Interpretive Data:  Result interpretation per CDC:  If Positive for Influenza Type A:  This test cannot distinguish Influenza A virus subtypes. This test cannot distinguish Influenza infections caused by Novel Influenza A viruses versus seasonal Influenza A viruses.  If Negative for Influenza A and B:  A negative result does not exclude influenza virus infection. If influenza is circulating in your community, a diagnosis of influenza should be considered based on a patient's clinical presentation and empiric antiviral treatment should be considered, if indicated. If more conclusive testing is desired, follow-up confirmatory testing is warranted.           Anti-infectives (From admission, onward)      Start     Dose/Rate Route Frequency Ordered Stop    12/20/22 2000  vancomycin (VANCOCIN) 500 mg in sodium chloride 0.9% 100 mL IVPB         500 mg  100 mL/hr over 60 Minutes Intravenous Every 24 hours 12/19/22 2249      12/20/22 0215  piperacillin-tazobactam (ZOSYN) 3.375 g in sodium chloride  0.9% 50 mL IVPB        See Hyperspace for full Linked Orders Report.    3.375 g  12.5 mL/hr over 240 Minutes Intravenous Every 12 hours 12/19/22 1729      12/08/22 1509  meropenem (MERREM) 500 MG injection  - ADS Override Pull        Note to Pharmacy: Created by cabinet override       12/08/22 1509 12/09/22 0314             Thank you for this consult,  Sharlet Salina, PharmD   10:49 PM     Electronically signed by Sharlet Salina, PharmD at 12/19/2022 10:51 PM EST

## 2022-12-19 NOTE — Procedures (Signed)
Formatting of this note might be different from the original.  Images from the original note were not included.  Inpatient Ostomy Services Note    Reason for consult: continued ostomy teaching    Subjective: Patient is awake and agreeable with ostomy teaching. Patient states that her husband has had a fourth heart attack and is in a [facility]. Patient states that she has not had any stool output through the ostomy but has some from her rectum. Patient's affect is flat today.     Objective: Patient presents on a [place] bed. Current colostomy appliance is in place without leaking.     Assessment:    Location: LUQ  Stoma: red, moist, central os, productive (of mucous), Red rubber catheter sutured in place, budded  Mucocutaneous junction: sutured  Peristomal skin: intact  Effluent: yellow/clear mucous  Pouching supplies: Stomahesive around colostomy and red rubber catheter, 2 3/4" Hollister flat wafer (#[id number]) and drainable ostomy pouch (#[id number])     Teaching provided:  Patient participated as able in verbal and physical review of ostomy care instructions including:  Removing current wafer and pouch using adhesive remover wipes  Cleansing stoma and surrounding skin with warm water or enzymatic spray and soft cloths  Measuring stoma  Cutting wafer to appropriate size  Application of stomahesive paste   Applying wafer to skin  Attaching pouch to wafer  Emptying pouch (opening and closing lock n' roll feature)    Patient was able to removed the wafer and to cut the new wafer. She needed assistance with other steps of appliance change due to pain and feeling overwhelmed.    Plan:  Patient will continue to familiarize self with ostomy routine and supplies. Practice emptying pouch independently or with bedside RN assist as needed.  Continue to review education materials.    Time spent with patient: [phone]    Electronically signed by Flonnie Overman, RN at 12/19/2022  3:12 PM EST

## 2022-12-20 NOTE — Progress Notes (Signed)
Formatting of this note is different from the original.  Images from the original note were not included.  CRITICAL CARE PROGRESS NOTE  Assessment/Plan   Interval History: Large volume gastric output in G tube 1700 bilious. No vomiting no nausea. Levo 8     Neurologic:   Pain: Minimal pain, needs. Will keep IV Dilaudid and transition to PRN PO tomorrow if tolerates sips/ice today    Cardiovascular:   Hypotension, secondary sepsis response   map goal 60, wean levo as tolerates  Albumin bolus    Respiratory:   Acute hypoxic respiratory failure   Presumed aspiration PNA  Cont pulmonary toilet  Wean Hi Flow goal SPO2 >90%  Cont abx for 7 days    GI/Nutrition:   Carcinomatosis s/p Gtube and diverting colostomy  Likely cont to aspirated due to obstruction from cancer spread  Cont G tube to gravity.  Allow sips.ice chips today  Re- educated pt that this will be her course and will only worsen so she needs to think about GOC as she will not recover and there is no surgical option for her. May need to stay on full liquid diet for remainder of life    Genitourinary:   AKI (Baseline Creatine 0.58)  Maintain foley Strict I/O  Cont IVF at 125  Avoid Nephrotoxic drug    Heme:   Chronic Anemia- no indication to transfuse. Monitor    Infectious Diseases:   1. aspiration PNA   Cont abx  2. UTi   Cont abx    Endocrine: No acute issuse    Musculoskeletal: PT, OOB to chair    Nursing/Prophylaxis:   DVT ppx: Start Lovenox, SCd  Gi ppx: Non indicated    Total Encounter Time (in minutes): 40 minutes      HPI:  Sue [name] is a 62 y.o.female with h/o colon and ovarian cancer, on active chemotherapy, HTN, PVD, Hep C (treated), presented to [facility] ED after passing dark , tarry stools and developing LLQ abdominal pain. Patient was further evaluated with CT scan study of abdomen and pelvis shows:  rectosigmoid wall thickening there is diffuse mild colonic wall thickening associated with the distention which may reflect a stercoral colitis,  however could not r/o malignacy. Further gi w/u recommended. Patient had guaiac positive dark stool by digital exam. H/H stable at 12.8/37.6. Lactic acid was 0.6 Mag 1.6 and hs Trop I was 17 then 21. Patient was referred to the Hospitalists team for further inpatient care.      Rapid response 1/26 for hypoxia and hypotension. Moved to SICU for concern of sepsis.     Physical Exam  Vitals reviewed.   Constitutional:       General: She is not in acute distress.     Appearance: She is underweight. She is ill-appearing. She is not toxic-appearing or diaphoretic.   HENT:      Head: Normocephalic.      Mouth/Throat:      Mouth: Mucous membranes are moist.      Pharynx: Oropharynx is clear.   Eyes:      Pupils: Pupils are equal, round, and reactive to light.   Cardiovascular:      Rate and Rhythm: Normal rate.   Pulmonary:      Effort: Pulmonary effort is normal. No respiratory distress.      Comments: HF 35 and 80%  Abdominal:      General: There is distension (Mild).      Palpations: Abdomen is soft.  Comments: Midline c/d/I, ostomy pink and patient. Gtube    Skin:     General: Skin is warm and dry.      Capillary Refill: Capillary refill takes less than 2 seconds.      Coloration: Skin is not jaundiced.   Neurological:      General: No focal deficit present.      Mental Status: She is alert and oriented to person, place, and time.     Last IPOC Decisions      Neuro   Pain Adequately Controlled?: Yes   Delirium  Addressed?: Yes   Respiratory/Vent Related   Is On Ventilator?: No   Restraint Order Updated?: N/A   Renal   Fluids & Electrolytes Addressed?: Yes       Nutrition and Endocrine   Diet/Nutrition Adequate?: No   Loose Stools?: No   SLP Evaluation?: No   Glucose Management?: Yes   ID   Antibiotics/Cultures Discussed?: Yes   Antibiotic List: vancomycin, zosyn, merrem   Antibiotic Duration: tbd   Culture  Results: none   Prophylaxis   Stress Ulcer Prophylaxis?: Yes   VTE Prophylaxis Plan: Mechanical (SCD's)   Skin  Breakdown / Wound Plan: Turn and reposition   Lines and Drains   Central Line: Yes, continue   Central Line - Reason to Maintain: Other IV med administration   Foley Catheter: Yes, continue   Physical Therapy   PT  Evaluation?: No   Mobilize Patient?: Yes   Social   DCP, family needs, and social issues addressed?: Yes   Organ Procurement Triggered?: No   Disposition   Meets transfer criteria per unit protocol?: No   Barriers to transfer?: Other     Level of Care Required?: Critical Care   _____________________                 Anice Paganini Details         User Time      Last Recorded by Carmelina Paddock, RN 12/20/2022 11:31 AM              DATA REVIEWED ON ROUNDS:   LOS: 12 days     PROBLEM LIST:  Principal Problem:    Acute GI bleeding  Active Problems:    Chronic hepatitis C virus infection (CMS/HCC)    HTN (hypertension)    Vascular problem    Pneumoperitoneum    Code Status: Full Code    NEURO:  RASS: 0  RASS: Alert and calm (12/15/22 0800)  Overall CAM-ICU: Negative (12/20/22 0800)        CARDIOVASCULAR:  Cardiac Rhythm: Normal sinus rhythm (12/18/22 1041)    [place]       Name Placement date Placement time Site Days Last dressing change    Single Lumen Implantable Port 05/24/22 Chest 05/24/22  --  Chest  210 12/19/22 2030 (17.14 hrs)    Peripheral IV 12/14/22 Anterior;Right;Upper Arm 12/14/22  1558  -- 5 12/19/22 0400 (33.64 hrs)    Peripheral IV 12/19/22 Anterior;Left;Proximal Forearm 12/19/22  1735  -- less than 1 12/19/22 2030 (17.14 hrs)               Active VTE Medication Orders        Start     Ordered    12/20/22 1215  enoxaparin (LOVENOX) syringe 30 mg  Every 24 hours scheduled         12/20/22 1145  Active VTE Mechanical Prophylaxis Orders        Start     Ordered    12/10/22 0032  Place sequential compression device  Until SCDs Discontinued       12/10/22 0031    12/08/22 0740  Place sequential compression device  Until SCDs Discontinued       12/08/22 0739                Active VTE Prophylaxis Contraindication Orders        Start     Ordered    12/10/22 0032  Pharmacological VT Prophylaxis Not Indicated/Contraindicated  Once        Question:  Reason:  Answer:  Due to risk of bleeding    12/10/22 0031    12/08/22 0740  Pharmacological VT Prophylaxis Not Indicated/Contraindicated  Once        Question:  Reason:  Answer:  Due to risk of bleeding    12/08/22 0739               RLE DVT Prophylaxis: Sequential compression device (12/20/22 0800)  LLE DVT Prophylaxis: Sequential compression device (12/20/22 0800)                RESPIRATORY:  AIRWAYS       Active Airway       None               O2 Sat: 97%  O2 Delivery Method: High flow nasal cannula (12/20/22 1231)          FiO2 (%):  [70 %-100 %] 70 %    ABG   Most recent results from last  72 hours   Lab Units 12/20/22  0122 12/19/22  2310 12/19/22  2121 12/19/22  1728 12/19/22  1723 12/19/22  1718   POCT PH, ARTERIAL   --   --   --   --   --  7.549*   POCT PCO2, ARTERIAL mmHg  --   --   --   --   --  50.2*   POCT SO2, ARTERIAL %  --   --   --   --   --  91.0*   POCT HCO3, ARTERIAL mmol/L  --   --   --   --   --  43*   POCT BASE EXCESS, ARTERIAL mmol/L  --   --   --   --   --  21*   LACTATE mmol/L 1.6 1.6 1.5 1.8  --   --    LACTATE ARTERIAL mmol/L  --   --   --   --  1.5*  --      Results for orders placed during the hospital encounter of 12/07/22    XR CHEST 1 VW 12/19/2022 (Final)    Status: Normal    Impression  Basilar hypoventilatory changes.  Otherwise unremarkable portable chest.    INTERPRETING PHYSICIAN: Cammie Sickle, MD  TF//Report ID: [id number]    Creation Date: 12/19/22 5:24 pm    XR CHEST 1 VW 12/15/2022 (Final)    Status: Abnormal    Impression  New airspace opacity posterior right lower lobe consistent with pneumonia in the appropriate clinical setting.    ++++++++++++++++++++++++++++++++++++++++++++++++++++++++++++++++++  +++++CLASS CODE+++++:  P-positive findings +++++ POSITIVE  +++++  ++++++++++++++++++++++++++++++++++++++++++++++++++++++++++++++++++    INTERPRETING PHYSICIAN: Minta Balsam, MD  DJS//Report ID: [id number]    Creation Date: 12/15/22 2:41 pm    Daily Weaning Trial  Respiratory Depth/Rhythm: Shallow (12/20/22  1200)  Respiratory Effort: Unlabored (12/20/22 1200)  Dyspnea Occurrence: With exertion (12/19/22 1945)    IHI Ventilator Associated Pneumonia Bundle  Head of Bed Elevated : HOB 30 (12/20/22 0413)  Oral Care: Mouth rinsed (12/18/22 2110)     GI/NUTRITION:  Adult NPO Diet NPO Except: Medications with sips of water, Other (See Comments ); Explanatory Comment: [place] ice      Dietary Orders   (From admission, onward)             Start     Ordered    12/20/22 1206  Patient Meal Preference  Once        Comments: Please send up an [place] ice for meals.    12/20/22 1206    12/20/22 1137  Adult NPO Diet NPO Except: Medications with sips of water, Other (See Comments ); Explanatory Comment: [place] ice  Diet effective now        Question Answer Comment   NPO Except: Medications with sips of water    NPO Except: Other (See Comments )    Explanatory Comment: [place] ice    Reason for NPO order Other    Other (Specify) Concern for respitory decompensation    Is patient allowed to have Tube Feeding? No    To Order: NPO       12/20/22 1144    12/17/22 1038  Patient Meal Preference  Until discontinued        Comments: Pt would like [place] Ice with lunch and dinner daily. Thanks    12/17/22 1038    12/08/22 0740  Nutrition Adjustment per Dietitian  Once         12/08/22 0739               Active GI Prophylaxis Orders       None             DRAINS, CATHETERS & TUBES       Active Drain / Urethral Catheter       Name Placement date Placement time Site Days    Gastrostomy/Enterostomy Gastrostomy 1 18 Fr. LUQ 12/09/22  2234  LUQ  10    Colostomy LLQ 12/09/22  2301  LLQ  10    Urethral Catheter 12/19/22  1746  -- less than 1                 GENITOURINARY:  Continuous Infusions        Continuous       Medication Ordered Dose/Rate, Route, Frequency Last Action    norepinephrine (LEVOPHED) 8 mg in sodium chloride 0.9% (32 mcg/mL) infusion (premix) 0-30 mcg/min, IV, Continuous Rate/Dose Change, 7 mcg/min at 01/27 1240    sodium chloride 0.9 % infusion 125 mL/hr, IV, Continuous Rate/Dose Verify, 125 mL/hr at 01/27 1200               I/O         01/26 0700  01/27 0659 01/27 0700  01/28 0659    P.O. 120 0    I.V. (mL/kg) 2785.7 (66) 739.5 (17.5)    IV Piggyback 350     Total Intake(mL/kg) 3255.7 (77.2) 739.5 (17.5)    Urine (mL/kg/hr) 415 (0.4) 170 (0.6)    Emesis/NG output 900 1775    Drains      Stool 550 50    Total Output 1865 1995    Net +1390.7 -1255.5      Unmeasured Urine Occurrence 1 x     Unmeasured  Stool Occurrence 0 x 1 x         ENDOCRINE:   Glucose Results - Last 24 Hours         12/13/22 12/14/22 12/15/22 12/16/22 12/19/22 12/19/22 12/19/22 12/20/22     0331 0536 0644 0636 1655 1712 1728 0122    Glucose 91 96 100 106 -- 128 120 132    POC Glu -- -- -- -- 136 -- -- --         Insulin administrations (last 24 hours)       None         SKIN:  Braden Scale Score: 13 (12/20/22 0800)       Enterostomal Therapy Consults   (From admission, onward)             Start     Ordered    12/19/22 1951  Wound Ostomy Continence Nurse Eval and Treat  Once        Question:  Reason for Consult?  Answer:  open area on sacrum    12/19/22 1951    12/10/22 0032  Wound Ostomy Continence Nurse Eval and Treat  Once        Question:  Reason for Consult?  Answer:  new colostomy    12/10/22 0031               Wound LDA's       Active Wound LDA's       Name Placement date Placement time Site Days Last dressing change    Wound 12/09/22 Incision Abdomen Mid;Anterior 12/09/22  --  Abdomen  11 12/16/22 1019 (99.33 hrs)               INFECTION CONTROL:  Temp: 98.4 F (36.9 C) (12/20/22 1200)    Isolation Status: No active isolations    Lab Results   Component Value Date    WBC 11.3 (H) 12/20/2022     Active Antibiotic  Orders        Start     Ordered    12/20/22 2000  vancomycin (VANCOCIN) 500 mg in sodium chloride 0.9% 100 mL IVPB  Every 24 hours         12/19/22 2249    12/20/22 0300  piperacillin-tazobactam (ZOSYN) 3.375 g in sodium chloride 0.9% 50 mL IVPB  Every 12 hours        See Hyperspace for full Linked Orders Report.    12/19/22 1729    12/08/22 1509  meropenem (MERREM) 500 MG injection  - ADS Override Pull        Note to Pharmacy: Created by cabinet override    12/08/22 1509               REHAB:  Therapy Orders (From admission, onward)       Start     Ordered    12/20/22 1145  PT Evaluation and Treatment  Once         12/20/22 1144               Physical Therapy Assessment       None         Fall Risk: Total Fall Risk Score (Sum of all points): 15 (12/20/22 0800)    CARE MANAGEMENT:  Discharge Planning      Flowsheet Row Most Recent Value   Patient wishes to be discharged to [facility] care Svc  [Pt will dc home with hh provide by [facility] for ostomy needs. Pt is  unsure if she wants family to provide transport of if she will use a medicaid taxi. Cm will continue to follow for dc needs] on 12/16/2022 1525   Referral made to [facility] on 12/15/2022 1436   Is Discharge Anticipated in 48 Hours?  Yes on 12/16/2022 1525   Discharge Plan  Home, new meds, Home health on 12/16/2022 1525   Barriers to discharge --  [GI Bleed, New Colostomy, CA metastaic  12/10/22 Initiated fever work-up CXR, UA, blood cultures, c.diff ( 12/15/22) Per Gen Surg 12-19-22 Pending patient decision. Pt's husband had a heart attack two days ago and he was going to be her primary care giver.] on 12/19/2022 1203         Vital Signs Last 24h Min/Max     Temp 98.4 F (36.9 C) (12/20/22 1200) Temp  Min: 98.4 F (36.9 C)  Max: 102.8 F (39.3 C)   HR 81 (12/20/22 1200) Pulse  Min: 71  Max: 126   RR 18 (12/20/22 1200) Resp  Min: 10  Max: 31   A-line BP   No data recorded   BP (!) 88/56 (12/20/22 1200) BP  Min: 64/48  Max: 143/81   SpO2 97 % (12/20/22 1200) SpO2   Min: 62 %  Max: 100 %   FiO2 70 % (12/20/22 1231) FiO2 (%)  Min: 70 %  Max: 100 %     SELECT LABS:  CBC   Most recent results from last  72 hours   Lab Units 12/20/22  0122 12/19/22  1728 12/19/22  1712   WBC k/mcL 11.3* 5.9  --    RBC Mil/mcL 3.09* 3.28*  --    HEMOGLOBIN gm/dL 10.6* 11.2*  --    HEMOGLOBIN POC g/dL  --   --  11.2*   HEMATOCRIT % 31.2* 33.4*  --    HEMATOCRIT POC %  --   --  33.0*   PLATELETS k/mcL 132* 136*  --      CMP   Most recent results from last  72 hours   Lab Units 12/20/22  0122 12/19/22  1737 12/19/22  1728 12/19/22  1712   SODIUM mmol/L 140  --  140 134*   POTASSIUM mmol/L 3.5  --  2.6* 2.6*   CHLORIDE mmol/L 90*  --  88* 85*   CO2 mmol/L 38*  --  41* 40*   ANION GAP mmol/L 12  --  11  --    BUN/CREAT RATIO  9.4*  --  7.3*  --    BUN mg/dL 20  --  17 16   CREATININE mg/dL 2.13*  --  2.33* 2.60*   GLUCOSE mg/dL 132*  --  120 128*   CALCIUM mg/dL 7.8*  --  8.1*  --    TOTAL PROTEIN gm/dL  --   --  6.1*  --    ALBUMIN gm/dL 3.1*  --  3.4  --    GLOBULIN, TOTAL gm/dL  --   --  2.7  --    A/G RATIO   --   --  1.3  --    ALK PHOS U/L  --   --  51  --    AST U/L  --   --  56*  --    ALT U/L  --   --  13  --    BILIRUBIN TOTAL mg/dL  --   --  0.7  --    MAGNESIUM mg/dL 2.1 1.4*  --   --  Cardiac Enzymes No results of this type in the last 72 hours    BNP No results of this type in the last 72 hours    Coags No results of this type in the last 72 hours    Hepatic Function   Most recent results from last  72 hours   Lab Units 12/20/22  0122 12/19/22  1728   ALBUMIN gm/dL 3.1* 3.4   TOTAL PROTEIN gm/dL  --  6.1*   BILIRUBIN TOTAL mg/dL  --  0.7   ALT U/L  --  13   AST U/L  --  56*   ALK PHOS U/L  --  51           Electronically signed by Alm Bustard, DO at 12/20/2022  2:09 PM EST

## 2022-12-20 NOTE — Care Plan (Signed)
Formatting of this note might be different from the original.  Goals:     Problem: PAIN - ADULT  Goal: Verbalizes/displays adequate comfort level or baseline comfort level  Outcome: Progressing    Problem: INFECTION - ADULT  Goal: Absence of infection during hospitalization  Outcome: Progressing  Goal: Absence of fever/infection during anticipated neutropenic period  Outcome: Progressing    Problem: SAFETY ADULT - FALL  Goal: Free from fall injury  Outcome: Progressing    Problem: DISCHARGE PLANNING  Goal: Discharge to home or other facility with appropriate resources  Outcome: Progressing    Problem: Knowledge Deficit  Goal: Patient/family/caregiver demonstrates understanding of disease process, treatment plan, medications, and discharge instructions  Outcome: Progressing    Problem: Potential for Compromised Skin Integrity  Goal: Skin Integrity is Maintained or Improved  Outcome: Progressing  Goal: Nutritional status is improving  Outcome: Progressing    Problem: Urinary Incontinence  Goal: Perineal skin integrity is maintained or improved  Outcome: Progressing    Problem: Hemodynamic Status  Goal: Patient's vitals signs are stable  Outcome: Progressing    Problem: Inadequate Airway Clearance  Goal: Patient will maintain patent airway  Outcome: Progressing  Goal: Patient will achieve/maintain normal respiratory rate/effort  Outcome: Progressing    Problem: Inadequate Gas Exchange  Goal: Patient is adequately oxygenated and ventilation is improved  Outcome: Progressing    Problem: Mechanical Ventilation  Goal: Patient Will Maintain Patent Airway  Outcome: Progressing  Goal: Oral health is maintained or improved  Outcome: Progressing  Goal: Tracheostomy will be managed safely  Outcome: Progressing  Goal: ET tube will be managed safely  Outcome: Progressing  Goal: Ability to express needs and understand communication  Outcome: Progressing  Goal: Mobility/activity is maintained at optimum level for patient  Outcome:  Progressing    Problem: Potential for Aspiration  Goal: Non-ventilated patient's risk of aspiration is minimized  Outcome: Progressing    Problem: Compromised Skin Integrity  Description: Use this problem when pressure ulcers are classified as stage I or II.  Goal: Skin Integrity is Maintained or Improved  Outcome: Progressing  Goal: Nutritional status is improving  Outcome: Progressing  Goal: Fluid and electrolyte balance are achieved/maintained  Outcome: Progressing    Problem: Nutrition  Goal: Nutritional status is improving  Outcome: Progressing      Identify possible barriers to meeting goals/advancing plan of care: NONE    Stability of the patient: Moderately Stable - Low risk of patient condition declining or worsening    End of Shift Summary:       Electronically signed by Rudean Hitt, RN at 12/20/2022  9:53 PM EST

## 2022-12-20 NOTE — Care Plan (Signed)
Formatting of this note might be different from the original.    Problem: PAIN - ADULT  Goal: Verbalizes/displays adequate comfort level or baseline comfort level  Outcome: Progressing    Problem: INFECTION - ADULT  Goal: Absence of infection during hospitalization  Outcome: Progressing  Goal: Absence of fever/infection during anticipated neutropenic period  Outcome: Progressing    Problem: SAFETY ADULT - FALL  Goal: Free from fall injury  Outcome: Progressing    Problem: DISCHARGE PLANNING  Goal: Discharge to home or other facility with appropriate resources  Outcome: Progressing    Problem: Knowledge Deficit  Goal: Patient/family/caregiver demonstrates understanding of disease process, treatment plan, medications, and discharge instructions  Outcome: Progressing    Problem: Potential for Compromised Skin Integrity  Goal: Skin Integrity is Maintained or Improved  Outcome: Progressing  Goal: Nutritional status is improving  Outcome: Progressing    Problem: Urinary Incontinence  Goal: Perineal skin integrity is maintained or improved  Outcome: Progressing    Problem: Hemodynamic Status  Goal: Patient's vitals signs are stable  Outcome: Progressing    Problem: Inadequate Airway Clearance  Goal: Patient will maintain patent airway  Outcome: Progressing  Goal: Patient will achieve/maintain normal respiratory rate/effort  Outcome: Progressing    Problem: Inadequate Gas Exchange  Goal: Patient is adequately oxygenated and ventilation is improved  Outcome: Progressing    Problem: Mechanical Ventilation  Goal: Patient Will Maintain Patent Airway  Outcome: Progressing  Goal: Oral health is maintained or improved  Outcome: Progressing  Goal: Tracheostomy will be managed safely  Outcome: Progressing  Goal: ET tube will be managed safely  Outcome: Progressing  Goal: Ability to express needs and understand communication  Outcome: Progressing  Goal: Mobility/activity is maintained at optimum level for patient  Outcome:  Progressing    Problem: Potential for Aspiration  Goal: Non-ventilated patient's risk of aspiration is minimized  Outcome: Progressing    Problem: Compromised Skin Integrity  Description: Use this problem when pressure ulcers are classified as stage I or II.  Goal: Skin Integrity is Maintained or Improved  Outcome: Progressing  Goal: Nutritional status is improving  Outcome: Progressing  Goal: Fluid and electrolyte balance are achieved/maintained  Outcome: Progressing    Problem: Nutrition  Goal: Nutritional status is improving  Outcome: Progressing    Electronically signed by Serita Kyle, RN at 12/20/2022  1:24 AM EST

## 2022-12-20 NOTE — Care Plan (Signed)
Formatting of this note might be different from the original.    Problem: PAIN - ADULT  Goal: Verbalizes/displays adequate comfort level or baseline comfort level  Outcome: Progressing  Flowsheets (Taken 12/18/2022 0745 by Barb Merino, RN)  Verbalizes/Displays Adequate Comfort Level or Baseline Comfort Level:   Encourage patient to monitor pain and request assistance   Assess pain using appropriate pain scale   Administer analgesics based on type and severity of pain and evaluate response   Implement non-pharmacological measures as appropriate and evaluate response    Problem: INFECTION - ADULT  Goal: Absence of infection during hospitalization  Outcome: Progressing  Flowsheets (Taken 12/17/2022 2016 by Jacquenette Shone, RN)  Absence of Infection During Hospitalization:   Assess and monitor for signs and symptoms of infection   Monitor lab/diagnostic results   Monitor all insertion sites i.e., indwelling lines, tubes and drains   Administer medications as ordered   Institute appropriate cooling/warming therapies per order   Monitor endotracheal (as able) and nasal secretions for changes in amount and color   Instruct and encourage patient and family to use good hand hygiene technique   Identify and instruct in appropriate isolation precautions for identified infection/condition  Goal: Absence of fever/infection during anticipated neutropenic period  Outcome: Progressing  Flowsheets (Taken 12/12/2022 0246 by Alexander Mt, RN)  Absence of Fever/Infection During Anticipated Neuropenic Period: Monitor white blood cell count    Problem: SAFETY ADULT - FALL  Goal: Free from fall injury  Outcome: Progressing  Flowsheets  Taken 12/18/2022 0745 by Barb Merino, RN  Fall prevention/injury reduction interventions for ALL patients scoring 5 or less on the [place]:   Orient patient/family to surroundings   Maintain bed in lowest position with brakes locked   Place **call bell** and personal items in easy  reach   **Respond promptly to call light   Keep ambulatory assistive devices within easy reach   Instruct all ambulatory patients to use skid proof footwear   Assess patient for use of side rails X 2   Include family in making plan of care for patient  Taken 12/12/2022 0246 by Alexander Mt, RN  Fall risk interventions for patients scoring 6 and above on the [place]:   Place YELLOW magnet/signage/lights outside patient's room   **Place YELLOW socks on patient to decrease slippage and increase fall risk awareness   **Place YELLOW armband on patient's wrist   **Activate bed/chair alarms    Problem: DISCHARGE PLANNING  Goal: Discharge to home or other facility with appropriate resources  Outcome: Progressing  Flowsheets (Taken 12/20/2022 1813)  Discharge to Home or Other Facility with Appropriate Resources:   Identify barriers to discharge with patient and caregiver   Arrange for needed discharge resources and transportation as appropriate   Identify discharge learning needs (meds, wound care, etc)   Arrange for interpreters to assist at discharge as needed   Refer to discharge planning if patient needs post-hospital services based on physician order or complex needs related to functional status, cognitive ability or social support system    Problem: Knowledge Deficit  Goal: Patient/family/caregiver demonstrates understanding of disease process, treatment plan, medications, and discharge instructions  Outcome: Progressing  Flowsheets (Taken 12/10/2022 2032 by Jacquenette Shone, RN)  Patient/Family/Caregiver Demonstrates Understanding of Disease Process, Treatment Plan, Medications, and Discharge Instructions:   Complete learning assessment and assess knowledge base   Provide teaching via preferred learning methods   Provide teaching at level of understanding    Problem:  Potential for Compromised Skin Integrity  Goal: Skin Integrity is Maintained or Improved  Outcome: Progressing  Flowsheets (Taken 12/18/2022 0745  by Barb Merino, RN)  Skin Integrity is Maintained or Improved:   Assess and monitor skin integrity   Identify patients at risk for skin breakdown on admission and per policy   Relieve pressure to bony prominences   Avoid shearing   Keep skin clean and dry   Encourage use of lotion/moisturizer on skin   Monitor patient's hygiene practices  Goal: Nutritional status is improving  Outcome: Progressing  Flowsheets (Taken 12/20/2022 1813)  Nutritional Status is Improving:   Monitor and assess patient for malnutrition (ex- brittle hair, bruises, dry skin, pale skin and conjunctiva, muscle wasting, smooth red tongue, and disorientation)   Collaborate with interdisciplinary team and initiate plan and interventions as ordered   Monitor patient's weight and dietary intake as ordered or per policy   Utilize nutrition screening tool and intervene per policy   Determine patient's food preferences and provide high-protein, high-caloric foods as appropriate   Assist patient with eating   Allow adequate time for meals   Encourage patient to take dietary supplement as ordered   Collaborate with clinical nutritionist   Include patient/family/caregiver in decisions related to nutrition    Problem: Urinary Incontinence  Goal: Perineal skin integrity is maintained or improved  Outcome: Progressing  Flowsheets (Taken 12/20/2022 1813)  Perineal Skin Integrity is Maintained or Improved:   Assess genitourinary system, perineal skin, labs (urinalysis), and history of incontinence to include past management, aggravating, and alleviating factors   Collaborate with interdisciplinary team including wound, ostomy, and continence nurse and initiate plans and interventions as needed   Keep skin clean and dry   Apply urine containment device   Apply skin protectant   Develop skin care regimen   Provide privacy when changing patient's incontinence device to maintain their dignity   Consider placing an indwelling catheter    Problem: Hemodynamic  Status  Goal: Patient's vitals signs are stable  Outcome: Progressing  Flowsheets (Taken 12/20/2022 1813)  Patient's Vital Signs are Stable:   Assess and monitor patient's heart rate, rhythm, respiratory rate, peripheral pulses, capillary refill, color, body temperature, intake and output, labs and physical activity tolerance   Observe for signs of chest pain (note location, duration, severity, radiation and associated symptoms such as diaphoresis, nausea, indigestion)   Monitor for signs and symptoms of heart failure (e.g. shortness of breath, edema of feet/ankles/legs, rapid irregular heart rate, coughing, wheezing, white/pink blood tinged sputum, sudden weight gain, chest pain)   Collaborate with interdisciplinary team and initiate plan and interventions as ordered   Position patient for maximum circulation/cardiac output   Monitor fluid intake   Plan activities to conserve energy   Include patient/family/caregiver in decisions related to anxiety    Problem: Inadequate Airway Clearance  Goal: Patient will maintain patent airway  Outcome: Progressing  Flowsheets (Taken 12/20/2022 1813)  Patient Will Maintain Patent Airway:   Assess and monitor breath sounds, cough, and sputum (if present), and intake/output   Collaborate with respiratory therapy to administer medications and treatments   Position patient for maximum ventilatory efficiency   Encourage adequate fluid intake to liquify secretions   Suction secretions as indicated to maintain patent airway   Encourage patient to cough and deep breathe   Encourage incentive spirometry  Goal: Patient will achieve/maintain normal respiratory rate/effort  Outcome: Progressing  Flowsheets (Taken 12/20/2022 1813)  Patient Will Achieve/Maintain Respiratory Rate/Effort:  Assess and monitor respiratory rate, effort, breathing pattern and oxygenation as ordered or per policy   Monitor patient for restlessness, anxiety, air hunger   Assess physical activity tolerance   Assess  patient's smoking history and intervene per policy   Collaborate with interdisciplinary team and initiate plans and interventions as needed   Plan activities to conserve energy   Initiate tobacco cessation referral/protocol as needed    Problem: Inadequate Gas Exchange  Goal: Patient is adequately oxygenated and ventilation is improved  Outcome: Progressing  Flowsheets (Taken 12/20/2022 1813)  Patient is adequately oxygenated and ventilation is improved:   Position patient for maximum ventilatory efficiency   Plan activities to conserve energy   Encourage patient to cough and deep breathe    Problem: Mechanical Ventilation  Goal: Patient Will Maintain Patent Airway  Outcome: Progressing  Flowsheets (Taken 12/20/2022 1813)  Patient Will Maintain Patent Airway:   Assess and monitor airway secretions and suction as needed per patient's condition and according to policy   Hyper oxygenate with 100% FiO2 prior to suctioning   Suctioning secretions as indicated to maintain patent airway   Elevate head of bed 30 degrees if patient has tube feeding  Goal: Oral health is maintained or improved  Outcome: Progressing  Goal: Tracheostomy will be managed safely  Outcome: Progressing  Goal: ET tube will be managed safely  Outcome: Progressing  Goal: Ability to express needs and understand communication  Outcome: Progressing  Goal: Mobility/activity is maintained at optimum level for patient  Outcome: Progressing    Problem: Potential for Aspiration  Goal: Non-ventilated patient's risk of aspiration is minimized  Outcome: Progressing    Problem: Compromised Skin Integrity  Description: Use this problem when pressure ulcers are classified as stage I or II.  Goal: Skin Integrity is Maintained or Improved  Outcome: Progressing  Flowsheets (Taken 12/18/2022 0745 by Barb Merino, RN)  Skin Integrity is Maintained or Improved:   Assess and monitor skin integrity   Identify patients at risk for skin breakdown on admission and per  policy   Relieve pressure to bony prominences   Avoid shearing   Keep skin clean and dry   Encourage use of lotion/moisturizer on skin   Monitor patient's hygiene practices  Goal: Nutritional status is improving  Outcome: Progressing  Flowsheets (Taken 12/20/2022 1813)  Nutritional Status is Improving:   Monitor and assess patient for malnutrition (ex- brittle hair, bruises, dry skin, pale skin and conjunctiva, muscle wasting, smooth red tongue, and disorientation)   Collaborate with interdisciplinary team and initiate plan and interventions as ordered   Monitor patient's weight and dietary intake as ordered or per policy   Utilize nutrition screening tool and intervene per policy   Determine patient's food preferences and provide high-protein, high-caloric foods as appropriate   Assist patient with eating   Allow adequate time for meals   Encourage patient to take dietary supplement as ordered   Collaborate with clinical nutritionist   Include patient/family/caregiver in decisions related to nutrition  Goal: Fluid and electrolyte balance are achieved/maintained  Outcome: Progressing    Problem: Nutrition  Goal: Nutritional status is improving  Outcome: Progressing  Flowsheets (Taken 12/20/2022 1813)  Nutritional Status is Improving:   Monitor and assess patient for malnutrition (ex- brittle hair, bruises, dry skin, pale skin and conjunctiva, muscle wasting, smooth red tongue, and disorientation)   Collaborate with interdisciplinary team and initiate plan and interventions as ordered   Monitor patient's weight and dietary intake as ordered or  per policy   Utilize nutrition screening tool and intervene per policy   Determine patient's food preferences and provide high-protein, high-caloric foods as appropriate   Assist patient with eating   Allow adequate time for meals   Encourage patient to take dietary supplement as ordered   Collaborate with clinical nutritionist   Include patient/family/caregiver in decisions  related to nutrition   Goals: Hemodynamic Stability, pain control, decreased vasopressor requirements, decrease supplemental oxygen requirements.      Identify possible barriers to meeting goals/advancing plan of care: HiFlo O2, Levophed    Stability of the patient: Moderately Unstable - Medium risk of patient condition declining or worsening    End of Shift Summary: Weaned down on Levophed and Hi-Flow O2. OOB to chair    Angela Burke, RN      Electronically signed by Angela Burke, RN at 12/20/2022  6:16 PM EST

## 2022-12-21 NOTE — Progress Notes (Signed)
Formatting of this note is different from the original.  Images from the original note were not included.  CRITICAL CARE PROGRESS NOTE  Assessment/Plan   Interval History: Patient was refusing blood draws. Still having feeling of cramping in her abdomen and intermittent nausea. Levo 2 with MAP in 85's.     Neurologic:   Pain: Minimal pain, needs. D/C IV Dilaudid.  Doesn't like how fentanyl patch makes her feel. Will give Oxy PO and give benadryl for itching.    Cardiovascular:   Hypotension, secondary sepsis response- resolving   map goal 60, wean levo as tolerates to off today    Respiratory:   Acute hypoxic respiratory failure   Presumed aspiration PNA  Cont pulmonary toilet  Wean Hi Flow goal SPO2 >90%  Cont abx for 7 days    GI/Nutrition:   Carcinomatosis s/p Gtube and diverting colostomy  Likely cont to aspirated due to obstruction from cancer spread  Cont G tube to gravity.  Strict NPO given on going obstructive symptoms and aspiration,.  Re-educated patient on need for [place]. If she wants to eat she needs to be CMO. I can not in good faith allow her to eat and aspirate as I know it will make her ill.    Genitourinary:   AKI (Baseline Creatine 0.58)  UOP adequate, d/c foley  Cont IVF decrease to 100 and add Dextrose  Avoid Nephrotoxic drug  Lab pending today after pt refused draw    Heme:   Chronic Anemia- no indication to transfuse. Monitor    Infectious Diseases:   1. aspiration PNA   Cont abx 7 day . Stop date 2/1  2. UTi   Cont abx 7 das  - stop date 2/1    Endocrine: No acute issuse    Musculoskeletal: PT, OOB to chair    Nursing/Prophylaxis:   DVT ppx: Start Lovenox, SCd  Gi ppx: Non indicated    Total Encounter Time (in minutes): 40 minutes      HPI:  Sue [name] is a 62 y.o.female with h/o colon and ovarian cancer, on active chemotherapy, HTN, PVD, Hep C (treated), presented to [facility] ED after passing dark , tarry stools and developing LLQ abdominal pain. Patient was further evaluated with CT scan  study of abdomen and pelvis shows:  rectosigmoid wall thickening there is diffuse mild colonic wall thickening associated with the distention which may reflect a stercoral colitis, however could not r/o malignacy. Further gi w/u recommended. Patient had guaiac positive dark stool by digital exam. H/H stable at 12.8/37.6. Lactic acid was 0.6 Mag 1.6 and hs Trop I was 17 then 21. Patient was referred to the Hospitalists team for further inpatient care.      Rapid response 1/26 for hypoxia and hypotension. Moved to SICU for concern of sepsis.     Physical Exam  Vitals reviewed.   Constitutional:       General: She is not in acute distress.     Appearance: She is underweight. She is ill-appearing. She is not toxic-appearing or diaphoretic.   HENT:      Head: Normocephalic.      Mouth/Throat:      Mouth: Mucous membranes are moist.      Pharynx: Oropharynx is clear.   Eyes:      Pupils: Pupils are equal, round, and reactive to light.   Cardiovascular:      Rate and Rhythm: Normal rate.   Pulmonary:      Effort: Pulmonary effort is  normal. No respiratory distress.      Comments: HF 35 and 60%, SpO2 98%  Abdominal:      General: There is distension (Mild).      Palpations: Abdomen is soft.      Comments: Midline c/d/I, ostomy pink and patient. Gtube    Skin:     General: Skin is warm and dry.      Capillary Refill: Capillary refill takes less than 2 seconds.      Coloration: Skin is not jaundiced.   Neurological:      General: No focal deficit present.      Mental Status: She is alert and oriented to person, place, and time.     IPOC Decisions Incomplete or Not Recorded Today     DATA REVIEWED ON ROUNDS:   LOS: 13 days     PROBLEM LIST:  Principal Problem:    Acute GI bleeding  Active Problems:    Chronic hepatitis C virus infection (CMS/HCC)    HTN (hypertension)    Vascular problem    Pneumoperitoneum    Code Status: Full Code    NEURO:  RASS: 0  RASS: Alert and calm (12/15/22 0800)  Overall CAM-ICU: Negative (12/20/22  2100)        CARDIOVASCULAR:  Cardiac Rhythm: Normal sinus rhythm (12/20/22 2000)    LINES       Active Lines       Name Placement date Placement time Site Days Last dressing change    Single Lumen Implantable Port 05/24/22 Chest 05/24/22  --  Chest  211 12/20/22 2000 (13.22 hrs)    Peripheral IV 12/14/22 Anterior;Right;Upper Arm 12/14/22  1558  -- 6 12/19/22 0400 (53.22 hrs)    Peripheral IV 12/19/22 Anterior;Left;Proximal Forearm 12/19/22  1735  -- 1 12/20/22 2000 (13.22 hrs)               Active VTE Medication Orders        Start     Ordered    12/20/22 1215  enoxaparin (LOVENOX) syringe 30 mg  Every 24 hours scheduled         12/20/22 1145               Active VTE Mechanical Prophylaxis Orders        Start     Ordered    12/10/22 0032  Place sequential compression device  Until SCDs Discontinued       12/10/22 0031    12/08/22 0740  Place sequential compression device  Until SCDs Discontinued       12/08/22 0739               Active VTE Prophylaxis Contraindication Orders        Start     Ordered    12/10/22 0032  Pharmacological VT Prophylaxis Not Indicated/Contraindicated  Once        Question:  Reason:  Answer:  Due to risk of bleeding    12/10/22 0031               RLE DVT Prophylaxis: Sequential compression device (12/20/22 2000)  LLE DVT Prophylaxis: Sequential compression device (12/20/22 2000)                RESPIRATORY:  AIRWAYS       Active Airway       None               O2 Sat: 95%  O2 Delivery Method: High flow nasal  cannula (12/21/22 0437)          FiO2 (%):  [55 %-80 %] 60 %    ABG   Most recent results from last  72 hours   Lab Units 12/20/22  0122 12/19/22  2310 12/19/22  2121 12/19/22  1728 12/19/22  1723 12/19/22  1718   POCT PH, ARTERIAL   --   --   --   --   --  7.549*   POCT PCO2, ARTERIAL mmHg  --   --   --   --   --  50.2*   POCT SO2, ARTERIAL %  --   --   --   --   --  91.0*   POCT HCO3, ARTERIAL mmol/L  --   --   --   --   --  43*   POCT BASE EXCESS, ARTERIAL mmol/L  --   --   --   --    --  21*   LACTATE mmol/L 1.6 1.6 1.5 1.8  --   --    LACTATE ARTERIAL mmol/L  --   --   --   --  1.5*  --      Results for orders placed during the hospital encounter of 12/07/22    XR CHEST 1 VW 12/19/2022 (Final)    Status: Normal    Impression  Basilar hypoventilatory changes.  Otherwise unremarkable portable chest.    INTERPRETING PHYSICIAN: Cammie Sickle, MD  TF//Report ID: [id number]    Creation Date: 12/19/22 5:24 pm    XR CHEST 1 VW 12/15/2022 (Final)    Status: Abnormal    Impression  New airspace opacity posterior right lower lobe consistent with pneumonia in the appropriate clinical setting.    ++++++++++++++++++++++++++++++++++++++++++++++++++++++++++++++++++  +++++CLASS CODE+++++:  P-positive findings +++++ POSITIVE +++++  ++++++++++++++++++++++++++++++++++++++++++++++++++++++++++++++++++    INTERPRETING PHYSICIAN: Minta Balsam, MD  DJS//Report ID: [id number]    Creation Date: 12/15/22 2:41 pm    Daily Weaning Trial  Respiratory Depth/Rhythm: Shallow (12/20/22 2000)  Respiratory Effort: Unlabored (12/20/22 2000)  Dyspnea Occurrence: With exertion (12/20/22 2000)    IHI Ventilator Associated Pneumonia Bundle  Head of Bed Elevated : HOB 30 (12/20/22 2000)  Oral Care: Mouth rinsed (12/20/22 2000)     GI/NUTRITION:  Adult NPO Diet NPO Except: Medications with sips of water      Dietary Orders   (From admission, onward)             Start     Ordered    12/21/22 0902  Adult NPO Diet NPO Except: Medications with sips of water  Diet effective now        Question Answer Comment   NPO Except: Medications with sips of water    Reason for NPO order Bowel obstruction    To Order: NPO       12/21/22 0903               Active GI Prophylaxis Orders       None             DRAINS, CATHETERS & TUBES       Active Drain / Urethral Catheter       Name Placement date Placement time Site Days    Gastrostomy/Enterostomy Gastrostomy 1 18 Fr. LUQ 12/09/22  2234  LUQ  11    Colostomy LLQ 12/09/22  2301  LLQ  11    Urethral  Catheter 12/19/22  1746  -- 1  GENITOURINARY:  Continuous Infusions       Continuous       Medication Ordered Dose/Rate, Route, Frequency Last Action    dextrose 5 % and sodium chloride 0.9 % infusion 100 mL/hr, IV, Continuous Ordered    norepinephrine (LEVOPHED) 8 mg in sodium chloride 0.9% (32 mcg/mL) infusion (premix) 0-30 mcg/min, IV, Continuous Rate/Dose Change, 20 mcg/min at 01/28 0845               I/O         01/27 0700  01/28 0659 01/28 0700  01/29 0659    P.O. 50     I.V. (mL/kg) 2989.5 (55.6)     IV Piggyback 250     Total Intake(mL/kg) 3289.5 (61.1)     Urine (mL/kg/hr) 686 (0.5)     Emesis/NG output 2250     Stool 100     Total Output 3036     Net +253.5       Unmeasured Stool Occurrence 1 x          ENDOCRINE:   Glucose Results - Last 24 Hours         12/13/22 12/14/22 12/15/22 12/16/22 12/19/22 12/19/22 12/19/22 12/20/22     0331 0536 0644 0636 1655 1712 1728 0122    Glucose 91 96 100 106 -- 128 120 132    POC Glu -- -- -- -- 136 -- -- --         Insulin administrations (last 24 hours)       None         SKIN:  Braden Scale Score: 13 (12/20/22 0800)       Enterostomal Therapy Consults   (From admission, onward)             Start     Ordered    12/19/22 1951  Wound Ostomy Continence Nurse Eval and Treat  Once        Question:  Reason for Consult?  Answer:  open area on sacrum    12/19/22 1951               Wound LDA's       Active Wound LDA's       Name Placement date Placement time Site Days Last dressing change    Wound 12/09/22 Incision Abdomen Mid;Anterior 12/09/22  --  Abdomen  12 12/16/22 1019 (118.90 hrs)               INFECTION CONTROL:  Temp: 97.7 F (36.5 C) (12/21/22 0400)    Isolation Status: No active isolations    Lab Results   Component Value Date    WBC 11.3 (H) 12/20/2022     Active Antibiotic Orders        Start     Ordered    12/21/22 1200  piperacillin-tazobactam (ZOSYN) 3.375 g in sodium chloride 0.9% 50 mL IVPB  Every 8 hours        See Hyperspace for full Linked  Orders Report.    12/21/22 0810    12/20/22 2000  vancomycin (VANCOCIN) 500 mg in sodium chloride 0.9% 100 mL IVPB  Every 24 hours         12/19/22 2249    12/08/22 1509  meropenem (MERREM) 500 MG injection  - ADS Override Pull        Note to Pharmacy: Created by cabinet override    12/08/22 1509  REHAB:  Therapy Orders (From admission, onward)       Start     Ordered    12/20/22 1145  PT Evaluation and Treatment  Once         12/20/22 1144               Physical Therapy Assessment       None         Fall Risk: Total Fall Risk Score (Sum of all points): 13 (12/20/22 2100)    CARE MANAGEMENT:  Discharge Planning      Flowsheet Row Most Recent Value   Patient wishes to be discharged to [facility] care S[facility]  [Pt will dc home with hh provide by [facility] for ostomy needs. Pt is unsure if she wants family to provide transport of if she will use a medicaid taxi. Cm will continue to follow for dc needs] on 12/16/2022 1525   Referral made to [place] on 12/15/2022 1436   Is Discharge Anticipated in 48 Hours?  Yes on 12/16/2022 1525   Discharge Plan  Home, new meds, Home health on 12/16/2022 1525   Barriers to discharge --  [GI Bleed, New Colostomy, CA metastaic  12/10/22 Initiated fever work-up CXR, UA, blood cultures, c.diff ( 12/15/22) Per Gen Surg 12-19-22 Pending patient decision. Pt's husband had a heart attack two days ago and he was going to be her primary care giver.] on 12/19/2022 1203         Vital Signs Last 24h Min/Max     Temp 97.7 F (36.5 C) (12/21/22 0400) Temp  Min: 97.7 F (36.5 C)  Max: 98.7 F (37.1 C)   HR 64 (12/21/22 0700) Pulse  Min: 62  Max: 109   RR (!) 13 (12/21/22 0700) Resp  Min: 8  Max: 24   A-line BP   No data recorded   BP 105/67 (12/21/22 0700) BP  Min: 67/46  Max: 133/76   SpO2 95 % (12/21/22 0700) SpO2  Min: 63 %  Max: 100 %   FiO2 60 % (12/21/22 0437) FiO2 (%)  Min: 55 %  Max: 80 %     SELECT LABS:  CBC   Most recent results from last  72 hours   Lab Units 12/20/22  0122  12/19/22  1728 12/19/22  1712   WBC k/mcL 11.3* 5.9  --    RBC Mil/mcL 3.09* 3.28*  --    HEMOGLOBIN gm/dL 10.6* 11.2*  --    HEMOGLOBIN POC g/dL  --   --  11.2*   HEMATOCRIT % 31.2* 33.4*  --    HEMATOCRIT POC %  --   --  33.0*   PLATELETS k/mcL 132* 136*  --      CMP   Most recent results from last  72 hours   Lab Units 12/20/22  0122 12/19/22  1737 12/19/22  1728 12/19/22  1712   SODIUM mmol/L 140  --  140 134*   POTASSIUM mmol/L 3.5  --  2.6* 2.6*   CHLORIDE mmol/L 90*  --  88* 85*   CO2 mmol/L 38*  --  41* 40*   ANION GAP mmol/L 12  --  11  --    BUN/CREAT RATIO  9.4*  --  7.3*  --    BUN mg/dL 20  --  17 16   CREATININE mg/dL 2.13*  --  2.33* 2.60*   GLUCOSE mg/dL 132*  --  120 128*   CALCIUM mg/dL 7.8*  --  8.1*  --    TOTAL PROTEIN gm/dL  --   --  6.1*  --    ALBUMIN gm/dL 3.1*  --  3.4  --    GLOBULIN, TOTAL gm/dL  --   --  2.7  --    A/G RATIO   --   --  1.3  --    ALK PHOS U/L  --   --  51  --    AST U/L  --   --  56*  --    ALT U/L  --   --  13  --    BILIRUBIN TOTAL mg/dL  --   --  0.7  --    MAGNESIUM mg/dL 2.1 1.4*  --   --      Cardiac Enzymes No results of this type in the last 72 hours    BNP No results of this type in the last 72 hours    Coags No results of this type in the last 72 hours    Hepatic Function   Most recent results from last  72 hours   Lab Units 12/20/22  0122 12/19/22  1728   ALBUMIN gm/dL 3.1* 3.4   TOTAL PROTEIN gm/dL  --  6.1*   BILIRUBIN TOTAL mg/dL  --  0.7   ALT U/L  --  13   AST U/L  --  56*   ALK PHOS U/L  --  51           Electronically signed by Alm Bustard, DO at 12/21/2022  9:14 AM EST

## 2022-12-21 NOTE — Progress Notes (Signed)
Formatting of this note might be different from the original.  Pt states that she would like to be back on the Dilaudid medication for pain.Pt informed that this will be communicated to the physician.  Electronically signed by Doretha Imus, RN at 12/22/2022  7:44 AM EST

## 2022-12-21 NOTE — Progress Notes (Signed)
Formatting of this note might be different from the original.  Physical Therapy  [facility] [place] [address]  Phone: [phone]    Missed Visit Note    Name: Sue [name]  MRN: [id number]  DOB:  10-Feb-1961  Date of service: 12/21/2022    Physical Therapy attempted to see the patient, but was unable due to the following reason: patient declined stating "I'm waiting for them to change some tubes" referring to the foley. This therapist educated for the purpose of PT services and benefits. Patient requested rehab return at a later time.     Return plan: will attempt return tomorrow    Signature: Electronically Signed By: Ignatius Specking, PT 12/21/2022 11:35 AM      Electronically signed by Ignatius Specking, PT at 12/21/2022 11:35 AM EST

## 2022-12-21 NOTE — Consults (Signed)
Formatting of this note is different from the original.  General Consult  Consults    Assessment/Plan   62 year old female with a history of stage III colon cancer and stage 4 ovarian cancer with progression through chemotherapy status post exploratory laparotomy for perforated viscus.  Abdomen was essentially frozen secondary to tumor burden.  I discussed in detail with the patient her prognosis.  She has had tumor progression through chemotherapy.  She will not be a candidate for chemotherapy for at least 6 weeks.  S chemotherapy has been ineffective, I would recommend hospice treatment.  Patient was very tearful and upset.  I spent some time with the patient to alleviate some of her fears.  She would like to discuss with her brother prior to making a decision.  Principal Problem:    Acute GI bleeding  Active Problems:    Chronic hepatitis C virus infection (CMS/HCC)    HTN (hypertension)    Vascular problem    Pneumoperitoneum      HPI:  Sue [name] is a 62 y.o.female with a history of stage III colon cancer status post resection in 2015 and stage IV ovarian cancer.  Patient has failed several lines of chemotherapy to date.  Patient was admitted for perforated viscus after sigmoidoscopy.  She underwent exploratory laparotomy.  Extensive tumor burden was identified.  The abdomen is essentially frozen.  There were no free areas of the intestine suitable for palliative bypass.  Patient has had numerous discussions with surgery and Oncology regarding hospice.  She has been hesitant to proceed.  I was asked to discuss with the patient.    Review of Systems   Constitutional:  Positive for activity change, appetite change, fatigue and unexpected weight change. Negative for chills and fever.   HENT:  Negative for trouble swallowing.    Eyes:  Negative for visual disturbance.   Respiratory:  Negative for cough, shortness of breath, wheezing and stridor.    Cardiovascular:  Negative for chest pain, palpitations and leg  swelling.   Gastrointestinal:  Negative for abdominal distention, abdominal pain, anal bleeding, blood in stool, constipation, diarrhea, nausea, rectal pain and vomiting.   Endocrine: Negative for polydipsia.   Genitourinary:  Negative for difficulty urinating and dysuria.   Musculoskeletal:  Negative for neck stiffness.   Skin:  Negative for rash and wound.   Neurological:  Negative for seizures and syncope.   Hematological:  Negative for adenopathy. Does not bruise/bleed easily.   Psychiatric/Behavioral:  Negative for confusion. The patient is not nervous/anxious.        Allergy(ies):  Penicillins, Hydrocodone, Orange fruit [citrus], Tomato, Demerol hcl [meperidine], Ibuprofen, Morphine, Oxycodone, Strawberry, and Tylenol [acetaminophen]    Prior to Admission medications    Medication Sig Start Date End Date Taking? Authorizing Provider   amLODIPine (NORVASC) 10 MG tablet Take 10 mg by mouth daily   Yes Historical Provider, MD   dexamethasone (DECADRON) 4 MG tablet Take 8 mg daily x 2 days after each chemo day 08/21/22  Yes Doreatha Lew, MD   lidocaine (LIDODERM) 5 % Apply 1 patch topically daily Remove & discard patch within 12 hours or as directed by MD.   Yes Historical Provider, MD   lidocaine-prilocaine (EMLA) cream Apply topically as needed for mild pain (PSR 1-3)   Yes Historical Provider, MD   lidocaine-prilocaine (EMLA) cream Apply cream generously over port site and cover with plastic, at least 1 hour prior to each treatment appointment 08/21/22  Yes Doreatha Lew, MD  losartan (COZAAR) 50 MG tablet Take 50 mg by mouth daily   Yes Historical Provider, MD   Lyrica 200 MG capsule Take 1 capsule (200 mg total) by mouth 2 (two) times a day 08/30/22 02/26/23 Yes Dionne Bucy Deridder, MD   Magnesium ([employer] TRIPLE MAGNESIUM COMPLEX) 400 MG capsule Take 400 mg by mouth 2 (two) times a day 11/14/22  Yes Zeb Comfort, NP   methadone (DOLOPHINE) 10 MG tablet  06/06/22  Yes Historical Provider, MD   ondansetron (ZOFRAN) 8 MG  tablet Take 8 mg by mouth every 8 (eight) hours as needed for nausea or vomiting As needed   Yes Historical Provider, MD   ondansetron (ZOFRAN) 8 MG tablet Take 1 tablet (8 mg total) by mouth every 8 (eight) hours as needed for nausea or vomiting Indications: Nausea and Vomiting caused by Cancer Chemotherapy Start second day after chemo 08/21/22  Yes Doreatha Lew, MD   Polyethyl Glyc-Propyl Glyc PF (Lubricant Eye Drops, PF,) 0.4-0.3 % solution Administer 1 drop into both eyes 4 (four) times a day During treatment with mirvetuximab soravtansine instill 4 times daily and as needed for dry eyes. 08/21/22  Yes Doreatha Lew, MD   prednisoLONE acetate (PRED FORTE) 1 % ophthalmic suspension Administer 1 drop into both eyes 6 (six) times a day Starting the day prior to each infusion, use one drop in each eye 6 times a day until day 4; then use 1 drop in each eye 4 times a day on days 5-8 of each cycle. 10/08/22  Yes Rigoberto Arteaga, OD   acetaminophen (TYLENOL) 325 MG tablet Take 325 mg by mouth every 6 (six) hours as needed for mild pain (PSR 1-3) As needed    Historical Provider, MD   docusate sodium (COLACE) 100 MG capsule Take 100 mg by mouth 2 (two) times a day As needed    Historical Provider, MD   HYDROcodone-acetaminophen ([place]) 5-325 MG per tablet Take 1-2 tablets by mouth every 6 (six) hours as needed for moderate pain (PSR 4-6) 11/28/22   Zeb Comfort, NP   methylPREDNISolone 4 MG tablet therapy pack use as directed FOLLOW DIRECTIONS ON BACK OF FOIL PACK 08/25/22   Historical Provider, MD   pregabalin (LYRICA) 200 MG capsule Take 1 capsule (200 mg total) by mouth 2 (two) times a day 08/28/22 09/27/22  Doreatha Lew, MD   prochlorperazine (COMPAZINE) 10 MG tablet Take 10 mg by mouth every 6 (six) hours as needed for nausea or vomiting As needed    Historical Provider, MD   prochlorperazine (COMPAZINE) 10 MG tablet Take 1 tablet (10 mg total) by mouth every 6 (six) hours as needed (Nausea and Vomiting) 08/21/22   Doreatha Lew, MD     Medications:  Scheduled:  artificial tears, 1 drop, Both Eyes, 4x daily  calcium gluconate, 1 g, Intravenous, Once   Followed by  calcium gluconate, 1 g, Intravenous, Once   Followed by  calcium gluconate, 1 g, Intravenous, Once  enoxaparin, 30 mg, Subcutaneous, Q24 Hrs [place]  lidocaine, 1 patch, Transdermal, Daily  piperacillin-tazobactam, 3.375 g, Intravenous, Q8 Hrs  pregabalin, 200 mg, Oral, BID  vancomycin, 500 mg, Intravenous, Q24 Hrs  vancomycin dosed by pharmacy, , Does not apply, Comm    Infusions:  dextrose 5 % and sodium chloride 0.9 %, 100 mL/hr, Last Rate: 100 mL/hr (12/21/22 0927)  norepinephrine, 0-30 mcg/min, Last Rate: Stopped (12/21/22 0933)    PRN:    Alum & Mag Hydroxide-Simeth  diphenhydrAMINE    diphenhydrAMINE    methyl salicylate - menthol    naloxone    ondansetron    oxyCODONE    oxyCODONE    prochlorperazine    simethicone    Past Medical History:   Diagnosis Date    Colon cancer (CMS/HCC)     Hepatitis C infection     treated 2015    Hypertension     Ovarian cancer (CMS/HCC)     PAD (peripheral artery disease) (CMS/HCC)      Past Surgical History:   Procedure Laterality Date    COLON SURGERY      OVARY SURGERY      PR SIGMOIDOSCOPY FLX W/BIOPSY SINGLE/MULTIPLE  12/09/2022    Procedure: SIGMOIDOSCOPY WITH BIOPSY;  Surgeon: Loel Dubonnet, MD;  Location: [facility];  Service: Gastroenterology     Family History:  family history is not on file.  has no family status information on file.     Social History:   reports that she has never smoked. She has never used smokeless tobacco. She reports that she does not currently use alcohol. She reports current drug use.    Physical Exam  Constitutional:       General: She is not in acute distress.     Appearance: She is well-developed.      Comments: Cachectic 62 year old female   HENT:      Head: Normocephalic and atraumatic.      Mouth/Throat:      Pharynx: No oropharyngeal exudate.   Eyes:      General: No scleral icterus.      Conjunctiva/sclera: Conjunctivae normal.      Pupils: Pupils are equal, round, and reactive to light.   Cardiovascular:      Rate and Rhythm: Normal rate and regular rhythm.   Pulmonary:      Effort: Pulmonary effort is normal. No respiratory distress.   Abdominal:      General: There is no distension.      Palpations: Abdomen is soft. There is no mass.      Tenderness: There is no abdominal tenderness. There is no guarding or rebound.      Hernia: No hernia is present.   Musculoskeletal:         General: Normal range of motion.      Cervical back: Normal range of motion and neck supple.   Lymphadenopathy:      Cervical: No cervical adenopathy.      Upper Body:      Right upper body: No supraclavicular or epitrochlear adenopathy.      Left upper body: No supraclavicular or epitrochlear adenopathy.   Skin:     General: Skin is warm and dry.   Neurological:      Mental Status: She is alert and oriented to person, place, and time.   Psychiatric:         Behavior: Behavior normal.     Labs:  Lab Results       ID Description Status Collection Date/Time    [id number] Renal Function Panel (Next AM)  In process 12/21/22 0857    [id number] Calcium, Ionized (Abnormal) Final result 12/21/22 0857      Result Value Flag Comment    Calcium Level Ionized 1.07 Low           [id number] Magnesium Level (Next AM)  In process 12/21/22 0857    [id number] Hemogram (CBC/No Diff) (Next AM) (Abnormal) Final result 12/21/22 0857  Result Value Flag Comment    White Blood Cell 6.3      Red Blood Cell 2.91 Low     Hemoglobin 9.8 Low     Hematocrit 29.3 Low     Mean Cell Volume 100.9 High     Mean Cell Hemoglobin 33.9 High     Mean Cell Hemoglobin Concentration 33.6      Red Cell Distribution Width 16.0 High     Platelet Count 87 Low     Mean Platelet Volume 8.6            [id number] Vancomycin Level, Peak  Needs to be Collected --           Lab results have been reviewed by myself as of December 21, 2022    Imaging:   Imaging (Last 3 days)        Procedure Component Value Units Date/Time    XR Chest 1 view PA or AP [[id number]] Collected: 12/19/22 1700    Order Status: Completed Updated: 12/19/22 1726    Narrative:      Jasmine December DESCRIPTION:  XR CHEST 1 VW    ACCESSION:  [id number]    COMPLETED DATE/TIME:  12/19/2022 5:19 pm    REASON FOR EXAM:  Rapid Response    COMPARISON:  December 15, 2022    REPORT:  Result:  Portable AP semi-erect examination the chest at 5:02 p.m..    Right-sided port infusion catheter with its tip at the level of the right atrium.    There are no soft tissue abnormalities.  Degenerative changes are present in the thoracic spine.    Slight linear atelectasis/scarring at the lung bases, right greater than left with the lungs otherwise being clear.  The pulmonary vasculature is normal.  The pleural surfaces are unremarkable.  There are no pleural effusions.  The heart is normal in size.  There is moderate tortuosity of the thoracic aorta.  The mediastinal structures are otherwise unremarkable.     Impression:      Basilar hypoventilatory changes.  Otherwise unremarkable portable chest.    INTERPRETING PHYSICIAN: Cammie Sickle, MD  TF//Report ID: [id number]    Creation Date: 12/19/22 5:24 pm         Radiology results have been reviewed by myself as of December 21, 2022    EKG:  EKG Stress Test Results (Last 7 days)       ** No results found for the last 168 hours. **         EKG Results (last 7 days)       Procedure Component Value Units Date/Time    EKG 12 lead [[id number]] Collected: 12/20/22 0628    Order Status: Completed Updated: 12/20/22 1301     Vent Rate 103     QRS Duration 148     QT/QTc 466     R-Axis 99     T-Axis -64    Narrative:      Test Reason : Reason for Exam:->Arrhythmia / Irregular Heart Beat  Blood Pressure :   */*   mmHG  Vent. Rate : 103 BPM     Atrial Rate :   * BPM  P-R Int :   * ms          QRS Dur : 148 ms  QT Int : 466 ms       P-R-T Axes :   *  99 -64 degrees  QTcB Int : 610 ms  Wide QRS rhythm with Premature  ventricular complexes or Fusion complexes  T wave abnormality, consider lateral ischemia  Abnormal ECG  When compared with ECG of 20-Dec-2022 06:00,  Wide QRS rhythm has replaced Sinus rhythm  Confirmed by Gerlene Burdock, MD, ERIC ([id number]) on 12/20/2022 1:00:41 PM  Referred By:            Confirmed By: Samuella Cota, MD    EKG 12 lead [[id number]] Collected: 12/20/22 0600    Order Status: Completed Updated: 12/20/22 1258     Vent Rate 84     PR Interval 126     QRS Duration 98     QT/QTc 430     P-Axis 69     R-Axis -8     T-Axis 44    Narrative:      Test Reason : Reason for Exam:->Arrhythmia / Irregular Heart Beat  Blood Pressure :   */*   mmHG  Vent. Rate :  84 BPM     Atrial Rate :  84 BPM  P-R Int : 126 ms          QRS Dur :  98 ms  QT Int : 430 ms       P-R-T Axes :  69  -8  44 degrees  QTcB Int : 508 ms  Sinus rhythm with sinus arrhythmia with occasional Premature ventricular complexes  ST -T  segment variations, non-specific  Prolonged QT  Abnormal ECG  Confirmed by Gerlene Burdock, MD, ERIC ([id number]) on 12/20/2022 12:58:27 PM  Referred By:            Confirmed By: Samuella Cota, MD    EKG 12 lead [[id number]] Collected: 12/19/22 1711    Order Status: Completed Updated: 12/20/22 0101     Vent Rate 112     PR Interval 130     QRS Duration 102     QT/QTc 398     P-Axis 71     R-Axis 49     T-Axis -86    Narrative:      Test Reason : Reason for Exam:->Shortness of Breath  Blood Pressure :   */*   mmHG  Vent. Rate : 112 BPM     Atrial Rate : 112 BPM  P-R Int : 130 ms          QRS Dur : 102 ms  QT Int : 398 ms       P-R-T Axes :  71  49 -86 degrees  QTcB Int : 543 ms  Sinus tachycardia  Left atrial enlargement  Minimal voltage criteria for LVH, may be normal variant ( Cornell product )  Marked ST abnormality, possible anterior subendocardial injury  Prolonged QT  Confirmed by Gerlene Burdock, MD, ERIC ([id number]) on 12/20/2022 1:00:51 AM  Referred By:            Confirmed By: Samuella Cota, MD         EKG results have been reviewed by myself as of December 21, 2022    Vital  Signs:  Patient Vitals for the past 24 hrs:   BP Temp Temp src Pulse Resp SpO2 Weight   12/21/22 0700 105/67 -- -- 64 (!) 13 95 % --   12/21/22 0649 113/70 -- -- 74 (!) 11 97 % --   12/21/22 0630 96/58 -- -- 68 (!) 9 94 % --   12/21/22 0600 108/62 -- -- 63 (!) 13 95 % --   12/21/22 0549 116/64 -- -- 71 16 95 % 118 lb 9.7 oz (  53.8 kg)   12/21/22 0530 123/71 -- -- 67 (!) 12 95 % --   12/21/22 0500 133/76 -- -- 74 (!) 10 97 % --   12/21/22 0400 (!) 85/53 97.7 F (36.5 C) Oral 62 (!) 9 96 % --   12/21/22 0330 90/50 -- -- 63 (!) 10 94 % --   12/21/22 0300 106/66 -- -- 65 (!) 8 95 % --   12/21/22 0230 103/57 -- -- 67 (!) 8 94 % --   12/21/22 0200 (!) 78/50 -- -- 63 (!) 9 95 % --   12/21/22 0130 96/63 -- -- 74 (!) 8 95 % --   12/21/22 0103 116/63 -- -- 71 (!) 12 93 % --   12/21/22 0100 116/63 -- -- 74 (!) 12 95 % --   12/21/22 0030 100/65 -- -- 72 (!) 12 95 % --   12/21/22 0000 94/58 97.9 F (36.6 C) -- 74 (!) 11 95 % --   12/20/22 2330 91/65 -- -- 76 18 97 % --   12/20/22 2300 91/52 -- -- 78 15 94 % --   12/20/22 2230 (!) 82/58 -- -- 83 (!) 12 93 % --   12/20/22 2200 98/61 -- -- 70 (!) 11 96 % --   12/20/22 2130 (!) 75/50 -- -- 71 18 93 % --   12/20/22 2100 97/57 -- -- 78 (!) 14 98 % --   12/20/22 2023 (!) 81/55 -- -- 78 15 (!) 63 % --   12/20/22 2000 99/60 97.7 F (36.5 C) Oral 77 16 96 % --   12/20/22 1930 105/63 -- -- 83 (!) 13 93 % --   12/20/22 1915 96/68 -- -- 77 17 95 % --   12/20/22 1900 (!) 73/53 -- -- 72 (!) 12 94 % --   12/20/22 1845 (!) 76/56 -- -- 81 15 -- --   12/20/22 1830 (!) 76/49 -- -- 75 (!) 14 95 % --   12/20/22 1815 94/63 -- -- 79 (!) 11 -- --   12/20/22 1800 104/69 -- -- 74 15 -- --   12/20/22 1745 91/62 -- -- 74 (!) 12 99 % --   12/20/22 1730 (!) 80/56 -- -- 80 (!) 13 95 % --   12/20/22 1715 100/80 -- -- 70 (!) 13 97 % --   12/20/22 1700 (!) 80/44 -- -- 72 (!) 11 90 % --   12/20/22 1645 108/72 -- -- 74 (!) 10 95 % --   12/20/22 1630 90/66 -- -- 78 (!) 12 94 % --   12/20/22 1615 (!) 81/59 -- -- 76  17 92 % --   12/20/22 1600 96/64 98.7 F (37.1 C) Oral 82 (!) 14 92 % --   12/20/22 1550 -- -- -- 77 18 97 % --   12/20/22 1545 (!) 80/52 -- -- 81 (!) 12 94 % --   12/20/22 1530 99/71 -- -- 77 15 95 % --   12/20/22 1515 114/56 -- -- 72 15 96 % --   12/20/22 1500 105/64 -- -- 72 16 95 % --   12/20/22 1445 93/60 -- -- 70 17 95 % --   12/20/22 1430 99/57 -- -- 76 15 97 % --   12/20/22 1415 (!) 82/54 -- -- 78 (!) 14 97 % --   12/20/22 1400 (!) 84/54 -- -- 71 (!) 13 97 % --   12/20/22 1345 (!) 74/52 -- -- 70 (!) 14 95 % --  12/20/22 1330 (!) 83/57 -- -- 73 17 100 % --   12/20/22 1315 98/66 -- -- 75 17 97 % --   12/20/22 1300 103/61 -- -- 78 16 96 % --   12/20/22 1245 94/62 -- -- 83 15 96 % --   12/20/22 1230 112/72 -- -- 76 18 100 % --   12/20/22 1215 90/77 -- -- 89 16 98 % --   12/20/22 1200 (!) 88/56 98.4 F (36.9 C) Oral 81 18 97 % --   12/20/22 1145 104/64 -- -- (!) 109 (!) 24 100 % --   12/20/22 1130 102/61 -- -- 80 15 100 % --   12/20/22 1115 (!) 87/53 -- -- 72 (!) 10 97 % --   12/20/22 1100 107/62 -- -- 71 (!) 10 97 % --   12/20/22 1045 92/71 -- -- 79 (!) 12 95 % --   12/20/22 1030 90/63 -- -- 78 16 93 % --   12/20/22 1015 (!) 67/46 -- -- 81 (!) 13 96 % --   12/20/22 1000 (!) 76/53 -- -- 80 15 98 % --     Intake/Output:    Intake/Output Summary (Last 24 hours) at 12/21/2022 0951  Last data filed at 12/21/2022 0600  Gross per 24 hour   Intake 2968.96 ml   Output 1231 ml   Net 1737.96 ml     Face to Face Patient Counseling / Coordinating Care >50% of Encounter Time:   Total Encounter Time (in minutes)       Electronically signed by Lawson Radar, MD at 12/21/2022  9:55 AM EST

## 2022-12-21 NOTE — Care Plan (Signed)
Formatting of this note might be different from the original.    Problem: PAIN - ADULT  Goal: Verbalizes/displays adequate comfort level or baseline comfort level  Outcome: Progressing    Problem: INFECTION - ADULT  Goal: Absence of infection during hospitalization  Outcome: Progressing  Goal: Absence of fever/infection during anticipated neutropenic period  Outcome: Progressing    Problem: SAFETY ADULT - FALL  Goal: Free from fall injury  Outcome: Progressing    Problem: DISCHARGE PLANNING  Goal: Discharge to home or other facility with appropriate resources  Outcome: Progressing    Problem: Knowledge Deficit  Goal: Patient/family/caregiver demonstrates understanding of disease process, treatment plan, medications, and discharge instructions  Outcome: Progressing    Problem: Potential for Compromised Skin Integrity  Goal: Skin Integrity is Maintained or Improved  Outcome: Progressing  Goal: Nutritional status is improving  Outcome: Progressing    Problem: Urinary Incontinence  Goal: Perineal skin integrity is maintained or improved  Outcome: Progressing    Problem: Hemodynamic Status  Goal: Patient's vitals signs are stable  Outcome: Progressing    Problem: Inadequate Airway Clearance  Goal: Patient will maintain patent airway  Outcome: Progressing  Goal: Patient will achieve/maintain normal respiratory rate/effort  Outcome: Progressing    Problem: Inadequate Gas Exchange  Goal: Patient is adequately oxygenated and ventilation is improved  Outcome: Progressing    Problem: Mechanical Ventilation  Goal: Patient Will Maintain Patent Airway  Outcome: Progressing  Goal: Oral health is maintained or improved  Outcome: Progressing  Goal: Tracheostomy will be managed safely  Outcome: Progressing  Goal: ET tube will be managed safely  Outcome: Progressing  Goal: Ability to express needs and understand communication  Outcome: Progressing  Goal: Mobility/activity is maintained at optimum level for patient  Outcome:  Progressing    Problem: Potential for Aspiration  Goal: Non-ventilated patient's risk of aspiration is minimized  Outcome: Progressing    Problem: Compromised Skin Integrity  Description: Use this problem when pressure ulcers are classified as stage I or II.  Goal: Skin Integrity is Maintained or Improved  Outcome: Progressing  Goal: Nutritional status is improving  Outcome: Progressing  Goal: Fluid and electrolyte balance are achieved/maintained  Outcome: Progressing    Problem: Nutrition  Goal: Nutritional status is improving  Outcome: Progressing      Electronically signed by Gigi Gin, RN at 12/21/2022  5:53 PM EST

## 2022-12-21 NOTE — Case Communication (Signed)
Formatting of this note is different from the original.  Patient has been titrated off HFNC and is currently trialing on a 6L NC.   12/21/22 1459   Vitals   Heart Rate 68   Resp 17   SpO2 92 %   Oxygen Therapy/Pulse Ox   $ Oxygen Therapy Supplemental oxygen   O2 Delivery Method Nasal cannula   O2 Flow Rate (L/min) 6 L/min   FiO2 (%) 44 %   Patient Activity At rest   Safety Instructions Yes (Comment)       Electronically signed by Brynda Peon, RRT at 12/21/2022  3:00 PM EST

## 2022-12-21 NOTE — Care Plan (Signed)
Formatting of this note might be different from the original.  Goals:     Problem: PAIN - ADULT  Goal: Verbalizes/displays adequate comfort level or baseline comfort level  Outcome: Progressing  Flowsheets (Taken 12/21/2022 2141)  Verbalizes/Displays Adequate Comfort Level or Baseline Comfort Level:   Assess pain using appropriate pain scale   Encourage patient to monitor pain and request assistance      Problem: Knowledge Deficit  Goal: Patient/family/caregiver demonstrates understanding of disease process, treatment plan, medications, and discharge instructions  12/21/2022 2142 by Doretha Imus, RN  Outcome: Progressing  Flowsheets (Taken 12/10/2022 2032 by Jacquenette Shone, RN)  Patient/Family/Caregiver Demonstrates Understanding of Disease Process, Treatment Plan, Medications, and Discharge Instructions:   Complete learning assessment and assess knowledge base   Provide teaching via preferred learning methods   Provide teaching at level of understanding  12/21/2022 2141 by Doretha Imus, RN  Outcome: Progressing  Flowsheets (Taken 12/10/2022 2032 by Jacquenette Shone, RN)  Patient/Family/Caregiver Demonstrates Understanding of Disease Process, Treatment Plan, Medications, and Discharge Instructions:   Complete learning assessment and assess knowledge base   Provide teaching via preferred learning methods   Provide teaching at level of understanding      Identify possible barriers to meeting goals/advancing plan of care:     Stability of the patient: Moderately Unstable - Medium risk of patient condition declining or worsening      Electronically signed by Doretha Imus, RN at 12/21/2022  9:43 PM EST

## 2022-12-21 NOTE — Nursing Note (Signed)
Summary: lab refusal    Formatting of this note might be different from the original.  Patient approached for a second time about lab draw and refused again, stating her arms hurt and "y'all keep taking blood and doing the same thing over and over and don't do nothing different". This nurse explained again the importance of obtaining ordered labs, especially because the patient has received electrolyte replacement. The patient responded with "maybe you can do it in a few hours".  Dr Owens Shark notified via page message.  Electronically signed by Rudean Hitt, RN at 12/21/2022  6:16 AM EST

## 2022-12-21 NOTE — Nursing Note (Signed)
Summary: LABS    Formatting of this note might be different from the original.  Patient refusing lab draw at this time, stating "I don't want to do it right now, y'all took enough".    If patient refuses again in an hour provider will be notified.  Electronically signed by Rudean Hitt, RN at 12/21/2022  4:19 AM EST

## 2022-12-22 NOTE — Progress Notes (Signed)
Formatting of this note is different from the original.  [facility] [place] [address]  Phone: [phone]    Physical Therapy Abbreviated Re-Evaluation    Name: Sue [name]  MRN: [id number]  DOB:  23-Jun-1961  Date of service: 12/22/2022  Diagnosis:     ICD-9-CM ICD-10-CM   1. Acute GI bleeding  578.9 K92.2   2. Left lower quadrant abdominal pain  789.04 R10.32   3. History of rectal cancer  V10.06 Z85.048   4. Hypomagnesemia  275.2 E83.42   5. Pneumoperitoneum  568.89 K66.8   6. Impaired mobility and ADLs  V49.89 Z74.09     Z78.9       Allergies: reviewed  Medications: reviewed    Past medical history:   Past Medical History:   Diagnosis Date    Colon cancer (CMS/HCC)     Hepatitis C infection     treated 2015    Hypertension     Ovarian cancer (CMS/HCC)     PAD (peripheral artery disease) (CMS/HCC)        Room number: 4606  Rehab precautions: PT  Treatment start time: [facility] course: Patient s/p exploratory laparotomy with lysis of adhesions, g-tube placement, and colostomy creation on 12/09/22. Patient previously evaluated by PT on 1/18 and deemed to have no acute needs at that time. Rapid Response called for acute hypoxia respiratory failure and was transferred to CCU on 12/20/22. Patient improving and has been moved back to general surgery unit.  Date of injury/surgery: admitted on 12/08/22    SUBJECTIVE  Patient report: "I was up walking earlier."   Pain: not noted    OBJECTIVE  Initial observation: Supine with HOBE slightly. Colostomy, IV, 4L O2 via NC, and hospital tube to gravity drainage attached. Chest port  Strength: 5/5 bilaterally  Range of motion: WFL  Sensation: WFL    Functional Mobility:   Balance: Sup for static and dynamic sitting and standing balance    Bed mobility: Sup for supine > sit, seated forward and backward scooting at EOB and in recliner.    Transfer: SBA for sit > stand from EOB and stand > sit to recliner using  RW    Ambulation: SBA for 74f using RW    Patient status:   Position: chair   Call bell within reach: Yes   Alarm: Chair alarm, Notified patient's nurse    ASSESSMENT  Response to Treatment: Patient required no physical assistance with mobility, transfers, and ambulation. Patient demonstrated good static and dynamic balance in sitting and standing while going through personal bags. Therapist assistance required only for line management. L forearm IV site began bleeding post supine > sit; nurse notified and to further address IV site post treatment. Patient encouraged to continue mobilizing with the staff and sitting up in chair throughout the day. BP taken throughout session and were as followed: sitting EOB - 111/60 mmHg, sitting post ambulation 101/61 mmHg.    Treatment frequency & duration: Acute care physical therapy is not recommended for the following reason(s): no skilled acute therapy needs     Equipment needs: Rolling Walker    PLAN  Recommended Consults: none  Discharge recommendations: Upon acute care discharge, it is recommended that the patient receive: Home Independently    Signature: Electronically Signed By: DOrbie Pyo SPT 12/22/2022 3:54 PM       Electronically signed by MRoda Shutters PT at 12/22/2022  3:59 PM  EST    Associated attestation - Roda Shutters, PT - 12/22/2022  3:59 PM EST  Formatting of this note might be different from the original.  I was present and participated in all aspects of clinical care and decision making for this patient.    Electronically Signed By: Roda Shutters, PT 12/22/2022 3:59 PM

## 2022-12-22 NOTE — Progress Notes (Signed)
Formatting of this note is different from the original.  PALLIATIVE PROGRESS NOTE    RECOMMENDATIONS/PLAN:  I. Pain and Sx Management   - Will give one-time IV Dilaudid '1mg'$    - D/C oxycodone and start Dilaudid '2mg'$  PO q 4 hrs PRN for moderate pain or '4mg'$  PO q 4 hrs PRN for severe pain  II. GOC   - FULL   - Pt agreeable to further discussions once pain is more manageable.  Will return to bedside later today to discuss events of weekend and what pt would like to do next.  III. Spiritual/Psychosocial   - A&Ox3, participatory in conversations, demonstrates understanding of situation, is consistent in responses and preferences regarding treatment, demonstrates comprehension of associated benefits/burdens/alternatives and rationale for choices.   - Pt completed "Appointment of a Healthcare Agent" naming her husband as primary with a son and daughter as secondary.  This is scanned into EMR.   - PC and spiritual care for support as needed.  IV. Disposition  - HD #15  - Prognosis can change with time however in light of widely metastatic cancer likely < several months    Discussed with Dr. Aviva Signs (attending physician) and J. Hunter RN    Do not delay discharge for Palliative Care.    HPI: PC follow-up in Sue [name] who is a 61 y/o female patient admitted 12-07-22 for acute GIB.  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 Pt presented to [facility] ED from home for several day history of black, tarry stools and N/V. She was admitted under hospitalist team, evaluated by GI and underwent upper endoscopy which showed gastritis and flex/sig which showed severe recto-sigmoid stricture that could not be transversed. Biopsies were obtained and sent.  Pt  was noted to have increased abdominal distension post-procedure.  Follow-up imaging showed development of frank pneumoperitoneum c/w perforated viscus. Surgeon spoke with pt, re: overall condition, high risk for surgery, and likely poor prognosis with pt stating desire to move forward with ex-lap. Ex-lap was performed - found dense adhesions and tumor burden throughout abdomen resulting in obstruction. Pt required significant lysis of adhesions but was able to have ostomy created and g-tube placed. Surgical team spoke with pt again post-op, re: findings, her systemic illness, and trajectory for recovery during which time pt became distressed. Pt had been offered hospice in the past by her oncologist (Dr. Cristina Gong with [facility]) however had previously expressed her wish to continue to fight the cancer. She was agreeable to further discussions with PC team, re: [place].  Her oncologist has seen her while inpatient.  Based on recent imaging and CA125 results, as well as relatively new start of current therapy, findings on ex-lap did not necessarily indicate failure of current treatment and, if pt desires/functional status supports this, she could continue with treatment outpatient.  I met with Sue [name] on 12-11-22 and GOC were established as noted above - pt is aware of her prognosis and consistently endorsed her desire for ongoing medical management and disease-directed therapy.  She was been agreeable to ongoing PC support for symptom management and psychosocial support as needed.  Plan had been for pt to d/c to home on 12-19-22 however her husband and primary caregiver was unfortunately hospitalized himself for acute MI with subsequent findings  of flu at [facility] and d/c was deferred until caregiving arrangements were finalized.  The evening of 12-19-22 pt had rapid response following decrease in SpO2, tachycardia, and hypotension.  Pt was having low-grade fever for several days, noted to be 102.68F at time of RR.   She was transferred to SICU and required pressor support.  Attending physician spoke with pt at that time, re: prognosis and likely aspiration event and start of strict NPO given ongoing obstructive symptoms.  Pt condition improved and she was able to transfer back out of SICU.  Dr. Berneda Rose with [facility] team spoke with pt as well on 12-21-22 regarding frozen abdomen and recommendations for hospice care, with pt requesting time to speak with family before making any decisions.    ASSESSMENT:  Patient Active Problem List   Diagnosis    Protein calorie malnutrition (CMS/HCC)    Ovarian CA, unspecified laterality (CMS/HCC)    At high risk for falls    Acute GI bleeding    Chronic hepatitis C virus infection (CMS/HCC)    HTN (hypertension)    Vascular problem    Pneumoperitoneum     SUBJECTIVE:   Spoke with Dr. Aviva Signs and attending team earlier this morning.  Agreed to f/u with pt after their visit this AM.    Met with pt at bedside.  Pt initially reported being in too much pain to have any discussions.  Endorsed generalized abdominal and back pain.  Requested going back to Dilaudid before having conversations regarding [place].  Pt also asked if I could order some Boost for her to drink.  Agreed to d/c oxycodone and switch to PO Dilaudid with one-time dose of IV Dilaudid for acute pain crisis.  Declined request to order Boost supplement given aspiration risks.    Pt agreed for me to return to bedside later today to further discuss events of this weekend and her wishes/next steps.    Pt relayed that her husband is now out of the hospital and was able to visit her yesterday, is home sleeping today.  She denied further questions, concerns, or needs at that time.    Sent notification via secure chat to Dr. Aviva Signs of above.    ROS:   Review of Systems - Negative except fatigue, generalized weakness, generalized abdominal and low back pain    OBJECTIVE:   Physical Exam  Vitals and nursing note reviewed.   Constitutional:        General: She is awake.      Appearance: She is underweight.      Interventions: Nasal cannula in place.      Comments: African American female   HENT:      Head: Normocephalic.      Nose: Nose normal.      Mouth/Throat:      Mouth: Mucous membranes are dry.   Eyes:      General:         Right eye: No discharge.         Left eye: No discharge.   Cardiovascular:      Rate and Rhythm: Tachycardia present.   Pulmonary:      Effort: Pulmonary effort is normal.   Abdominal:      General: There is no distension.      Tenderness: There is abdominal tenderness.      Comments: (+) ostomy   Genitourinary:     Comments: Deferred  Musculoskeletal:      Right lower leg: No edema.  Left lower leg: No edema.   Skin:     General: Skin is warm.   Neurological:      Mental Status: She is alert and oriented to person, place, and time.   Psychiatric:         Mood and Affect: Mood normal.         Behavior: Behavior normal. Behavior is cooperative.     A total of 55 minutes were spent in chart preparation/chart review, direct patient contact, review of pertinent studies and lab work as well as in documentation.  This was all performed on the same day as the patient's visit.    Salvatore Marvel, NP-C, [facility]  Palliative Care  Pager: [phone]  Electronically signed by Jerral Ralph, MD at 12/23/2022 12:21 PM EST

## 2022-12-22 NOTE — Care Plan (Signed)
Formatting of this note might be different from the original.    Problem: PAIN - ADULT  Goal: Verbalizes/displays adequate comfort level or baseline comfort level  Outcome: Progressing    Problem: SAFETY ADULT - FALL  Goal: Free from fall injury  Outcome: Progressing    Problem: Potential for Compromised Skin Integrity  Goal: Skin Integrity is Maintained or Improved  Outcome: Progressing    Problem: Potential for Compromised Skin Integrity  Goal: Nutritional status is improving  Outcome: Progressing    Problem: Mechanical Ventilation  Goal: Patient Will Maintain Patent Airway  Outcome: Progressing    Problem: Mechanical Ventilation  Goal: ET tube will be managed safely  Outcome: Progressing    Problem: Mechanical Ventilation  Goal: Ability to express needs and understand communication  Outcome: Progressing    Problem: Potential for Aspiration  Goal: Non-ventilated patient's risk of aspiration is minimized  Outcome: Progressing    Problem: Compromised Skin Integrity  Description: Use this problem when pressure ulcers are classified as stage I or II.  Goal: Skin Integrity is Maintained or Improved  Outcome: Progressing    Problem: Compromised Skin Integrity  Description: Use this problem when pressure ulcers are classified as stage I or II.  Goal: Fluid and electrolyte balance are achieved/maintained  Outcome: Progressing    Electronically signed by Kizzie Fantasia, RN at 12/22/2022 11:17 PM EST

## 2022-12-22 NOTE — Procedures (Signed)
Formatting of this note is different from the original.  Images from the original note were not included.  Inpatient Wound Care Services    Reason for consult: "open area on sacrum"    Subjective: Patient is awake and quiet. Patient is agreeable with wound care visit.     Objective: Patient laying to her right side on [place] bed.     Braden Scale Score: 17    Assessment:      12/22/22 1121   Wound 12/22/22 Pressure Injury Sacrum   Date First Assessed/Time First Assessed: 12/22/22 1115   Present on Original Admission: No  Primary Wound Type: Pressure Injury  Location: Sacrum   Wound Image    Site Assessment -See Row Info for Descriptions  Purple;Pink   Peri-Wound Assessment Intact   Wound Length (cm) 3.5 cm   Wound Width (cm) 1.9 cm   Wound Surface Area (cm^2) 6.65 cm^2   Wound Depth (cm)   (unable to be determined)   Drainage Amount None   Primary Dressing (See Row Info for Details)  Silicone dressing   Dressing Changed Changed   Dressing Status Clean;Dry;Intact   Pressure Injury Stage DTPI  (evolving deep tissue pressure injury)     Patient turned to allow assessment of sacrum. Sacrum has a wound present with pink area surrounding by purple boggy skin. Area is consistent with a deep tissue pressure injury. Silicone foam dressing applied to protect wound from friction.     Plan:  Silicone foam dressing to the sacrum to protect wound.   Encourage patient to turn every 2 hours using pillow and/or wedges to off load pressure.     Time spent with patient: [phone]; assisted by WOCN B. Nicole Kindred.  Electronically signed by Flonnie Overman, RN at 12/22/2022  3:17 PM EST

## 2022-12-22 NOTE — Progress Notes (Signed)
Formatting of this note is different from the original.  Vancomycin Progress Note    Assessment:   Sue [name] is being treated for S. aureus pneumonia  .     Results from last 7 days   Lab Units 12/22/22  0613 12/21/22  2244   VANCOMYCIN TR mcg/mL 8.9*  --    VANCOMYCIN PK mcg/mL  --  13.0*     True (Corrected) Ctrough: 4.34 mcg/mL  Vancomycin AUC 24hr (mghr/L): 198    Estimated Creatinine Clearance: 74.9 mL/min (by C-G formula based on SCr of 0.67 mg/dL).    Patient is currently Subtherapeutic based on two level kinetics.  Trough collected early as levels suspected to be low due to improved renal function  Maintenance dose: 500 mg every 24 hr    Estimated PK Parameters:   Vancomycin ke (hr ^-1): 0.0506  Vancomycin half-life (hr): 13.7  Vancomycin Vd (L): Steady state Vd : 49.887  Vancomycin clearance (L/hr): 2.5243    Plan:    1.  Will dose the patient using scheduled dosing.  2.  Will increase the dose to 1000 mg q18h to achieve closer to AUC goal of 400 to 600 mghr/L.  3.  Will order a trough before the fourth dose of new dosing regimen.     Predicted Results:  Predicted vancomycin peak (mcg/mL): 32.7  Predicted vancomycin trough (mcg/mL): 13.83  Predicted vancomycin AUC 24hr (mghr/L): 528    Subjective & Objective   Subjective:  Sue [name] is a 62 y.o. female who is being treated for    . Sue [name] is allergic to penicillins, hydrocodone, orange fruit [citrus], tomato, demerol hcl [meperidine], ibuprofen, morphine, oxycodone, strawberry, and tylenol [acetaminophen]..    Past Medical History:   Diagnosis Date    Colon cancer (CMS/HCC)     Hepatitis C infection     treated 2015    Hypertension     Ovarian cancer (CMS/HCC)     PAD (peripheral artery disease) (CMS/HCC)      Past Surgical History:   Procedure Laterality Date    COLON SURGERY      OVARY SURGERY      PR SIGMOIDOSCOPY FLX W/BIOPSY SINGLE/MULTIPLE  12/09/2022    Procedure: SIGMOIDOSCOPY WITH BIOPSY;  Surgeon: Loel Dubonnet, MD;   Location: [facility];  Service: Gastroenterology     Sue [name] is in the hospital currently for:  Principal Problem:    Acute GI bleeding  Active Problems:    Chronic hepatitis C virus infection (CMS/HCC)    HTN (hypertension)    Vascular problem    Pneumoperitoneum    Vitals  BP: 123/78  Temp: 36.9 C (98.5 F)  Temp Source: Oral  Heart Rate: 72  Resp: 18  SpO2: 92 %  Height: '5\' 7"'$  (170.2 cm)  Weight: 118 lb 9.7 oz (53.8 kg)     Objective:  Labs:   Results from last 7 days   Lab Units 12/22/22  0613 12/21/22  0857 12/20/22  0122 12/19/22  1728   BUN mg/dL 20 28* 20 17   CREATININE mg/dL 0.67 1.13* 2.13* 2.33*   WBC k/mcL  --  6.3 11.3* 5.9     Estimated Creatinine Clearance: 74.9 mL/min (by C-G formula based on SCr of 0.67 mg/dL).         Microbiology Results       ** No results found for the last 24 hours. **         Anti-infectives (From admission, onward)  Start     Dose/Rate Route Frequency Ordered Stop    12/22/22 0900  vancomycin (VANCOCIN) 1,000 mg in sodium chloride 0.9% 250 mL IVPB         1,000 mg  250 mL/hr over 60 Minutes Intravenous Every 18 hours 12/22/22 0830 12/26/22 0259    12/21/22 1200  piperacillin-tazobactam (ZOSYN) 3.375 g in sodium chloride 0.9% 50 mL IVPB        See Hyperspace for full Linked Orders Report.    3.375 g  12.5 mL/hr over 240 Minutes Intravenous Every 8 hours 12/21/22 0810 12/28/22 1159    12/08/22 1509  meropenem (MERREM) 500 MG injection  - ADS Override Pull        Note to Pharmacy: Created by cabinet override       12/08/22 1509 12/09/22 0314             Thank you for this consult,  Arcola Jansky, PharmD   8:30 AM     Electronically signed by Arcola Jansky, PharmD at 12/22/2022  8:33 AM EST

## 2022-12-22 NOTE — Progress Notes (Signed)
Formatting of this note is different from the original.  Images from the original note were not included.      General Surgery Progress Note    Reason for visit: Abdominal pain, dark stools    Diet:  NPO except sips with meds    DVT ppx: SCDs    Operation Performed:   Exploratory laparotomy with LOA, g-tube, colostomy (01/16 Dr. Rondel Jumbo)  Flex sigmoidoscopy by GI (01/16)    Subjective   No acute events overnight,   Sue [name] reports that her respiratory distress has significantly improved and that she would like eat something today. She expressed she understand her risk of aspiration again. She reports that she will speak with palliative today for further care planning. She states she she was very drowsy and fatigue with fentanyl patch in the ICU and she would like to avoid using it. She currently denies SOB, chest pain, nausea and/or vomiting.     Review of Systems  As above.     Vital Signs:  Vitals:    12/21/22 1807 12/21/22 2059 12/22/22 0040 12/22/22 0620   BP: 133/71  116/71 123/78   BP Location: Left arm  Left arm Left arm   Pulse: 77 72 72    Resp: '18 18 18 18   '$ Temp: 97.3 F (36.3 C)  98.6 F (37 C) 98.5 F (36.9 C)   TempSrc: Oral  Oral Oral   SpO2: 97% 98% 98% 92%   Weight:       Height:         Physical Exam  Constitutional:       General: She is not in acute distress.     Appearance: She is underweight. She is ill-appearing. She is not toxic-appearing or diaphoretic.   HENT:      Head: Normocephalic.      Mouth/Throat:      Mouth: Mucous membranes are moist.      Pharynx: Oropharynx is clear.   Eyes:      Pupils: Pupils are equal, round, and reactive to light.   Cardiovascular:      Rate and Rhythm: Normal rate.   Pulmonary:      Effort: Pulmonary effort is normal. No respiratory distress.   Abdominal:      General: There is distension (Mild).      Palpations: Abdomen is soft.      Comments: Midline c/d/I, ostomy pink and patient. Gtube    Skin:     General: Skin is warm and dry.      Capillary Refill:  Capillary refill takes less than 2 seconds.      Coloration: Skin is not jaundiced.   Neurological:      General: No focal deficit present.      Mental Status: She is alert and oriented to person, place, and time.   Results from last 7 days   Lab Units 12/21/22  0857 12/20/22  0122 12/19/22  1728 12/19/22  1712 12/16/22  0636   WBC k/mcL 6.3 11.3* 5.9  --  3.3*   HEMOGLOBIN gm/dL 9.8* 10.6* 11.2*  --  9.7*   HEMOGLOBIN POC   --   --   --    < >  --    HEMATOCRIT % 29.3* 31.2* 33.4*  --  28.1*   HEMATOCRIT POC   --   --   --    < >  --    PLATELETS k/mcL 87* 132* 136*  --  120*  NEUTROPHILS % MAN Rel %  --   --  84  --   --    NEUTROPHILS % Rel %  --  87.0  --   --  58.5   LYMPHS PCT MAN Rel %  --  7.0 11  --  22.8   MONO PCT MAN Rel %  --   --  3  --   --    MONOS PCT Rel %  --  5.7  --   --  18.1   EOS PCT Rel %  --  0.0 0  --  0.2   BASOS PCT MAN Rel %  --   --  0  --   --    BASOS PCT Rel %  --  0.3  --   --  0.4    < > = values in this interval not displayed.     Results from last 7 days   Lab Units 12/22/22  0613 12/21/22  0857 12/20/22  0122   SODIUM mmol/L 145 145 140   POTASSIUM mmol/L 3.1* 3.6 3.5   CHLORIDE mmol/L 104 97* 90*   CO2 mmol/L 37* 38* 38*   BUN mg/dL 20 28* 20   CREATININE mg/dL 0.67 1.13* 2.13*   GLUCOSE mg/dL 109 87 132*   CALCIUM mg/dL 8.3* 8.4 7.8*     Intake/Output this shift/ 24hours:  No intake/output data recorded.    Intake/Output Summary (Last 24 hours) at 12/22/2022 0959  Last data filed at 12/22/2022 0516  Gross per 24 hour   Intake 20 ml   Output 340 ml   Net -320 ml     Assessment/Plan     62 y.o. 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 CT scan of  the abdomen with rectosigmoid colon wall thickening and proximal small and large bowel dilatation concerning for colonic obstruction vs neoplasm. Patient underwent EGD and flex sigmoidoscopy by GI on 12/09/22. Post-procedurally, patient had worsening abdominal distension and a repeat KUB revealed pneumoperitoneum. General surgery was consulted and patient underwent exploratory laparotomy with lysis of adhesions, g-tube placement, and colostomy creation. Intraoperatively, patient found to have dense adhesions and tumor burden throughout the abdomen resulting in obstruction and had a suspected transverse colon perforation. 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    She is now s/p EGD, flex sig, with takeback for ex-lap, lysis of adhesions, G-tube placement, and colostomy creation (1/16), found to have dense tumor burden and suspected transverse colon perforation, with poor prognosis and decreased ostomy output likely secondary to disease progression. Due to Sue [name]'s desire and request, Sue [name] was placed on regular diet last week, however Sue [name] did not tolerate, she had significant nausea and vomiting which lead to aspirate. Sue [name] was moved to ICU due to respiratory distress. Sue [name] did recover well and was moved out of the ICU in stable condition. Sue [name] has not completely accepted her prognosis and would like full extend of life saving intervention. Sue [name] has been made aware that there is surgical options to treat her current prognosis. We highly recommend hospice at this point in time. Colorectal specialist has been consulted for a second opinion who has also  recommended hospice treatment. Sue [name] is continuous discussion with palliative team for a plan moving forward. Sue [name] would like to have a PO diet again though she is aware of the high risk of aspiration.     #Carcinomatosis s/p Gtube and diverting colostomy   - NPO for now, will consider a soft PO diet  after discussion with palliative.   - Pain control per palliative care  - Monitor drain output  - Hem/onc consulted, appreciate recs  - Sue [name]/OT  - Continue open G-tube to gravity    #Acute hypoxic respiratory failure   Presumed aspiration PNA  Cont pulmonary toilet  Cont abx for 7 days    Principal Problem:    Acute GI bleeding  Active Problems:    Chronic hepatitis C virus infection (CMS/HCC)    HTN (hypertension)    Vascular problem    Pneumoperitoneum    Rowland Lathe, DO  General Surgery PGY1  ACS Pager [phone]     Face to Face Patient Counseling / Coordinating Care >50% of Encounter Time:   Total Encounter Time (in minutes):    Portions of the record may have been created with voice recognition software. Occasional wrong word or "sound a like" substitutions may have occurred due to the inherent limitations of voice recognition software. Read the chart carefully and recognize, using context, where substitutions have occurred.    Electronically signed by Minda Ditto, DO at 12/22/2022 10:35 AM EST    Associated attestation - Minda Ditto, DO - 12/22/2022 10:35 AM EST  Formatting of this note is different from the original.  I saw and evaluated the patient, participating in the key portions of the service.  I reviewed the resident?s note.  I agree with the resident?s findings and plan.

## 2022-12-22 NOTE — Progress Notes (Signed)
Formatting of this note might be different from the original.  PALLIATIVE NOTE    Returned to bedside this afternoon.  Pt was medicated with IV Dilaudid ~3.5 hours earlier.  She was sound asleep, woke to repetitive questioning and opened her eyes.  Reported that she had not slept all night due to pain and that this was the first time she'd had a prolonged sleep.  She was not agreeable to have [place] discussions at that time and requested I come back later in day.    Will re-attempt [place] discussions later this afternoon, census allowing, otherwise will return first thing tomorrow AM for further conversations regarding [place] and next steps.  Pt would be best served by a transition to comfort measures only with hospice involvement at this time.    Salvatore Marvel, NP-C, [facility]  Palliative Care  Pager: [phone]  Electronically signed by Jerral Ralph, MD at 12/23/2022 12:21 PM EST

## 2022-12-22 NOTE — Care Plan (Signed)
Formatting of this note might be different from the original.    Problem: PAIN - ADULT  Goal: Verbalizes/displays adequate comfort level or baseline comfort level  Outcome: Progressing  Flowsheets (Taken 12/21/2022 2141 by Doretha Imus, RN)  Verbalizes/Displays Adequate Comfort Level or Baseline Comfort Level:   Assess pain using appropriate pain scale   Encourage patient to monitor pain and request assistance    Problem: SAFETY ADULT - FALL  Goal: Free from fall injury  Outcome: Progressing  Flowsheets (Taken 12/18/2022 0745 by Barb Merino, RN)  Fall prevention/injury reduction interventions for ALL patients scoring 5 or less on the [place]:   Orient patient/family to surroundings   Maintain bed in lowest position with brakes locked   Place **call bell** and personal items in easy reach   **Respond promptly to call light   Keep ambulatory assistive devices within easy reach   Instruct all ambulatory patients to use skid proof footwear   Assess patient for use of side rails X 2   Include family in making plan of care for patient    Electronically signed by Jimmie Molly, RN at 12/22/2022  9:28 AM EST

## 2022-12-22 NOTE — Progress Notes (Signed)
Formatting of this note might be different from the original.  PALLIATIVE NOTE    Returned to bedside.  Pt was awake, alert, and seated in bedside chair with nephew present.  She appeared brighter and much more comfortable.  Pt was agreeable to further discussions regarding [place] and sent nephew from the room.    General overview of events of the weekend - pt did not want to dwell on this for long, stated she'd had "4 different doctors talk to me about the cancer" and she understood.  She wants to eat, wants to have good pain management, is tired of all the lab work and needle sticks, tired of having to wear the nasal cannula and the constant IV fluids.  We discussed shifting the focus of care to comfort and letting her eat for pleasure.  Discussed all the ways that hospice can support her.    Pt asked again about whether or not she could have outpatient palliative.  Advised patient that (1) she lives outside the service area for home palliative care and (2) palliative care would not provide nearly the same degree of support as hospice.  Advised that hospice would be the best way to care for her at this time.  We talked about the fact that the cancer is doing what it does regardless of what is happening now in the hospital - she is not getting treatment for the cancer, and the things we can do to help with her comfort and quality of life are currently limited due to focus on medical management.    Discussed that someone can understand the need for hospice even while still struggling to come to terms with it mentally, emotionally, and spiritually.  Pt stated her understanding and agreement with that, however deferred further discussions due to personal care needs.    We agreed to hold on further discussions and final conversations regarding [employer] until tomorrow AM.  Advised that if she is in agreement with hospice, we can ask the hospice liaison that saw her last week to come back to talk with her about hospice further.   Pt stated her understanding and agreement.    Discussed with D. Barksdale (CM).  Sent notification of above to Dr. Aviva Signs (attending physician).    Salvatore Marvel, NP-C, [facility]  Palliative Care  Pager: [phone]  Electronically signed by Jerral Ralph, MD at 12/23/2022 12:21 PM EST

## 2022-12-22 NOTE — Progress Notes (Signed)
Formatting of this note might be different from the original.  Informed rounding physician regarding pt request to have Dilaudid for pain control. Pt also verbalized to this physician wanting to be put on a soft diet.  Electronically signed by Doretha Imus, RN at 12/22/2022  7:46 AM EST

## 2022-12-23 NOTE — Progress Notes (Signed)
Formatting of this note is different from the original.  Images from the original note were not included.      General Surgery Progress Note    Reason for visit: Abdominal pain, dark stools    Diet:  NPO except sips with meds    DVT ppx: SCDs    Operation Performed:   Exploratory laparotomy with LOA, g-tube, colostomy (01/16 Dr. Rondel Jumbo)  Flex sigmoidoscopy by GI (01/16)    Subjective   No acute events overnight,   She denies significant abdominal pain. She spoke with palliative yesterday and is willing to speak with Hospice this morning. She is also planning to speak with her family about hospice. She denies significant chest pain or SOB this morning.    Review of Systems  As above.     Vital Signs:  Vitals:    12/22/22 1238 12/22/22 1532 12/22/22 2346 12/23/22 0620   BP: 90/51 112/72 126/78 112/75   BP Location: Left arm Left arm Right arm Right arm   Pulse: 71 78 88 82   Resp: 17 17     Temp:  98.1 F (36.7 C) (!) 99.1 F (37.3 C) 98.5 F (36.9 C)   TempSrc:  Oral Oral Oral   SpO2:  97% 95% 100%   Weight:       Height:         Physical Exam  Constitutional:       General: She is not in acute distress.     Appearance: She is underweight. She is ill-appearing. She is not toxic-appearing or diaphoretic.   HENT:      Head: Normocephalic.      Mouth/Throat:      Mouth: Mucous membranes are moist.      Pharynx: Oropharynx is clear.   Eyes:      Pupils: Pupils are equal, round, and reactive to light.   Cardiovascular:      Rate and Rhythm: Normal rate.   Pulmonary:      Effort: Pulmonary effort is normal. No respiratory distress.   Abdominal:      General: There is distension (Mild).      Palpations: Abdomen is soft.      Comments: Midline c/d/I, ostomy pink and patent. Gtube    Skin:     General: Skin is warm and dry.      Capillary Refill: Capillary refill takes less than 2 seconds.      Coloration: Skin is not jaundiced.   Neurological:      General: No focal deficit present.      Mental Status: She is alert and  oriented to person, place, and time.   Results from last 7 days   Lab Units 12/23/22  0735 12/21/22  0857 12/20/22  0122 12/19/22  1728   WBC k/mcL 2.6* 6.3 11.3* 5.9   HEMOGLOBIN gm/dL 7.9* 9.8* 10.6* 11.2*   HEMATOCRIT % 23.7* 29.3* 31.2* 33.4*   PLATELETS k/mcL 76* 87* 132* 136*   NEUTROPHILS % MAN Rel %  --   --   --  84   NEUTROPHILS % Rel %  --   --  87.0  --    LYMPHS PCT MAN Rel %  --   --  7.0 11   MONO PCT MAN Rel %  --   --   --  3   MONOS PCT Rel %  --   --  5.7  --    EOS PCT Rel %  --   --  0.0 0   BASOS PCT MAN Rel %  --   --   --  0   BASOS PCT Rel %  --   --  0.3  --      Results from last 7 days   Lab Units 12/23/22  0735 12/22/22  0613 12/21/22  0857   SODIUM mmol/L 150* 145 145   POTASSIUM mmol/L 3.6 3.1* 3.6   CHLORIDE mmol/L 110 104 97*   CO2 mmol/L 34* 37* 38*   BUN mg/dL 10 20 28*   CREATININE mg/dL 0.53 0.67 1.13*   GLUCOSE mg/dL 104 109 87   CALCIUM mg/dL 7.5* 8.3* 8.4     Intake/Output this shift/ 24hours:  No intake/output data recorded.    Intake/Output Summary (Last 24 hours) at 12/23/2022 0905  Last data filed at 12/23/2022 0650  Gross per 24 hour   Intake --   Output 775 ml   Net -775 ml     Assessment/Plan     62 y.o. 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 CT scan of the abdomen with rectosigmoid colon wall thickening and proximal small and large bowel dilatation concerning for colonic obstruction vs neoplasm. Patient underwent EGD and flex sigmoidoscopy by GI on 12/09/22. Post-procedurally, patient had worsening abdominal distension and a repeat KUB revealed pneumoperitoneum. General surgery was consulted and patient underwent exploratory laparotomy  with lysis of adhesions, g-tube placement, and colostomy creation. Intraoperatively, patient found to have dense adhesions and tumor burden throughout the abdomen resulting in obstruction and had a suspected transverse colon perforation. 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    She is now s/p EGD, flex sig, with takeback for ex-lap, lysis of adhesions, G-tube placement, and colostomy creation (1/16), found to have dense tumor burden and suspected transverse colon perforation, with poor prognosis and decreased ostomy output likely secondary to disease progression. Due to pt's desire and request, pt was placed on regular diet last week, however pt did not tolerate, she had significant nausea and vomiting which lead to aspirate. Pt was moved to ICU due to respiratory distress. Pt did recover well and was moved out of the ICU in stable condition. Pt has not completely accepted her prognosis and would like full extend of life saving intervention. Pt has been made aware that there is surgical options to treat her current prognosis. We highly recommend hospice at this point in time. Colorectal specialist has been consulted for a second opinion who has also recommended hospice treatment. Pt is continuous discussion with palliative team for a plan moving forward. Pt would like to have a PO diet again though she is aware of the high risk of aspiration.     #Carcinomatosis s/p Gtube and diverting colostomy   - NPO for now  - Pain control per palliative care  - Monitor drain output  - Hem/onc consulted, appreciate recs  - PT/OT  - Continue open G-tube to gravity  - Pending hospice evaluation today    #Acute hypoxic respiratory failure   Presumed aspiration PNA  Cont pulmonary toilet  Cont abx for 7 days    Principal Problem:    Acute GI bleeding  Active Problems:    Chronic hepatitis C virus infection (CMS/HCC)    HTN (hypertension)     Vascular problem    Pneumoperitoneum     Acie Fredrickson, MD   PGY-3 General Surgery   ACS Pager [phone]    Face to Face Patient Counseling / Coordinating Care >50% of Encounter Time:   Total Encounter Time (in minutes):    Portions of the record may have been created with voice recognition software. Occasional wrong word or "sound a like" substitutions may have occurred due to the inherent limitations of voice recognition software. Read the chart carefully and recognize, using context, where substitutions have occurred.    Electronically signed by Minda Ditto, DO at 12/23/2022  9:29 AM EST    Associated attestation - Minda Ditto, DO - 12/23/2022  9:29 AM EST  Formatting of this note is different from the original.  I saw and evaluated the patient, participating in the key portions of the service.  I reviewed the resident?s note.  I agree with the resident?s findings and plan.

## 2022-12-23 NOTE — Care Plan (Signed)
Formatting of this note might be different from the original.    Problem: PAIN - ADULT  Goal: Verbalizes/displays adequate comfort level or baseline comfort level  Outcome: Progressing  Flowsheets (Taken 12/21/2022 2141 by Doretha Imus, RN)  Verbalizes/Displays Adequate Comfort Level or Baseline Comfort Level:   Assess pain using appropriate pain scale   Encourage patient to monitor pain and request assistance    Problem: INFECTION - ADULT  Goal: Absence of infection during hospitalization  Outcome: Progressing  Flowsheets (Taken 12/17/2022 2016 by Jacquenette Shone, RN)  Absence of Infection During Hospitalization:   Assess and monitor for signs and symptoms of infection   Monitor lab/diagnostic results   Monitor all insertion sites i.e., indwelling lines, tubes and drains   Administer medications as ordered   Institute appropriate cooling/warming therapies per order   Monitor endotracheal (as able) and nasal secretions for changes in amount and color   Instruct and encourage patient and family to use good hand hygiene technique   Identify and instruct in appropriate isolation precautions for identified infection/condition      Electronically signed by Jimmie Molly, RN at 12/23/2022 12:04 PM EST

## 2022-12-23 NOTE — Progress Notes (Signed)
Formatting of this note might be different from the original.  Care Coordinator Discharge Plan     Discharge Plan    Discharge Planning    Patient wishes to be discharged to: Hospice/Home (Pt will dc home with [facility] 12/24/2022 around [place] has ordered DME, including a portable  concentrator, hospital bed, over the table, hand rails for  bed, and etc.The family is coordinating arrangements for the DME delivery tomorrow.)  Referral made to: DC Planning  Is Discharge Anticipated in 48 Hours? : Yes  Discharge Plan : Home hospice    Barriers to discharge:  (Pt to discharge home tomorrow afternoon via ambulance, Tentatively scheduled for 3pm tomorrow)  Primary Caregiver is willing and able to perform any and all duties?: Yes (Spouse)  Who will patient reside with? : Spouse/significant other  Support Systems: Spouse/significant other, Family members  DME equipment needs: New equipment needed  Services needing prior authorization: Transportation  Freedom of Choice Was Discussed: Yes  Doctor, general practice List Provided : Patient Declined  Choice of providers: Yes (any hh agency)  Patient/Family/POA directed to Medicare Compare site for review of post-acute facility/agency quality data : Yes  Case Management assessment reviewed with the patient or primary caregiver?: Yes (Reviewed with patient who is alert and oriented)  How ready does the patient/family feel they are prepared for discharge?: Prepared  Does the patient need discharge transport arranged?: No (Husband will transport home)  Next review date: 12/24/22  Case Management [place] Patient Discharge Ready : Yes     Medicare IM  Date discharge Medicare IM given: 12/16/22  Medicare IM Given Time: 6599  Discharge Medicare IM provided by: [facility])  Discharge Medicare IM given to: Patient  Discharge Medicare IM given via: Face to Face at Bedside    Discharge Interventions  Discharge Activities: Chart review less than 30 minutes, Ensure  discharge transportation plans were discussed and agreed upon  Home Services: Home Health  Teach Back Provided : Discharge Plan        [facility]    Electronically signed by Constance Goltz at 12/23/2022  4:36 PM EST

## 2022-12-23 NOTE — Progress Notes (Signed)
Formatting of this note is different from the original.  PALLIATIVE PROGRESS NOTE    RECOMMENDATIONS/PLAN:  I. Pain and Sx Management   - Dilaudid '2mg'$  PO q 4 hrs PRN moderate pain or '4mg'$  PO a 4 hrs PRN for severe pain  II. [place] re-consulted.  Pt is going to speak with her family this morning, requests to meet with hospice liaison after 12 o'clock today to discuss home with hospice.  - She is still hopeful that she may go home and start to feel better/improve and is holding onto hope that more treatment may become an option in the future but for now is interested in going home with hospice.  III. Spiritual/Psychosocial   - A&Ox3, participatory in conversations, demonstrates understanding of situation, is consistent in responses and preferences regarding treatment, demonstrates comprehension of associated benefits/burdens/alternatives and rationale for choices.  - Pt completed "Appointment of a Healthcare Agent" naming her husband as primary with a son and daughter as secondary.  This is scanned into EMR.              - PC and spiritual care for support as needed.  IV. Disposition  - HD #16  - Prognosis can change with time however in light of widely metastatic cancer likely < several months     Discussed with attending team.  Sent notification via secure chat to J. Hunter (primary RN), Ronnie Derby SW (hospice liaison), and D. Barksdale (CM)    Do not delay discharge for Palliative Care.    HPI: PC follow-up in Sue [name] who is a 62 y.o. female patient admitted 12/07/2022 for Acute GI bleeding [K92.2].  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 Pt presented to [facility] ED from home for several day history of black,  tarry stools and N/V. She was admitted under hospitalist team, evaluated by GI and underwent upper endoscopy which showed gastritis and flex/sig which showed severe recto-sigmoid stricture that could not be transversed. Biopsies were obtained and sent.  Pt was noted to have increased abdominal distension post-procedure.  Follow-up imaging showed development of frank pneumoperitoneum c/w perforated viscus. Surgeon spoke with pt, re: overall condition, high risk for surgery, and likely poor prognosis with pt stating desire to move forward with ex-lap. Ex-lap was performed - found dense adhesions and tumor burden throughout abdomen resulting in obstruction. Pt required significant lysis of adhesions but was able to have ostomy created and g-tube placed. Surgical team spoke with pt again post-op, re: findings, her systemic illness, and trajectory for recovery during which time pt became distressed. Pt had been offered hospice in the past by her oncologist (Dr. Cristina Gong with [facility]) however had previously expressed her wish to continue to fight the cancer. She was agreeable to further discussions with PC team, re: [place].  Her oncologist has seen her while inpatient.  Based on recent imaging and CA125 results, as well as relatively new start of current therapy, findings on ex-lap did not necessarily indicate failure of current treatment and, if pt desires/functional status supports this, she could continue with treatment outpatient.  I met with Sue [name] on 12-11-22 and GOC were established as noted above - pt is aware of her prognosis and consistently endorsed her desire  for ongoing medical management and disease-directed therapy.  She was been agreeable to ongoing PC support for symptom management and psychosocial support as needed.  Plan had been for pt to d/c to home on 12-19-22 however her husband and primary caregiver was unfortunately hospitalized himself for acute MI with subsequent findings of flu at [facility] and d/c was deferred until caregiving arrangements were finalized.  The evening of 12-19-22 pt had rapid response following decrease in SpO2, tachycardia, and hypotension.  Pt was having low-grade fever for several days, noted to be 102.10F at time of RR.  She was transferred to SICU and required pressor support.  Attending physician spoke with pt at that time, re: prognosis and likely aspiration event and start of strict NPO given ongoing obstructive symptoms.  Pt condition improved and she was able to transfer back out of SICU.  Dr. Berneda Rose with [facility] team spoke with pt as well on 12-21-22 regarding frozen abdomen and recommendations for hospice care, with pt requesting time to speak with family before making any decisions.     ASSESSMENT:  Patient Active Problem List   Diagnosis    Protein calorie malnutrition (CMS/HCC)    Ovarian CA, unspecified laterality (CMS/HCC)    At high risk for falls    Acute GI bleeding    Chronic hepatitis C virus infection (CMS/HCC)    HTN (hypertension)    Vascular problem    Pneumoperitoneum     SUBJECTIVE:   Met with pt this morning.  No visitors at time of [facility] NP exam.  Pt was easily awakened.  ROS completed as noted below.  Sent notification to RN requesting medication for pain.    Pt consented to continue our discussion from yesterday afternoon, re: GOC:  - discussed differences between hospice and palliative care  - advised that pt would receive far more support with hospice and reminded her that [name] is outside the service area for [facility]'s home-based palliative team  - pt wants to be able to eat, wants to have more liberal pain management  - discussed starting comfort care while in the hospital, goal of which would be to help with her comfort while working on home with hospice  - pt and I agreed on need for her to call her family and advise them of all of this  - she requested that hospice liaison stop by again today after 12 o'clock to allow time to call  family    Pt was not sure if she remembered the hospice liaison and asked me if it was the same person who came by earlier in the hospitalization and asked her if she "still wanted to fight". Informed her that I was not sure if this was the same person or not - hospice liaison would come by to give her more info on hospice.  Pt asked what would happen if she got home with hospice, started to feel better, and decided she wanted to pursue more treatment after all.  Advised that if this was the case, she could always choose to voluntarily dis-enroll from hospice in order to see about more cancer treatment.  Pt stated her understanding and agreement.    Attending team came to bedside at that time.  Informed them of plans for hospice visit later today.    ROS:   Review of Systems - Negative except dry mouth, hunger, fatigue, and generalized abdominal pain radiating to back    OBJECTIVE:   Physical Exam  Vitals  and nursing note reviewed.   Constitutional:       Comments: African American female, chronically ill-appearing   HENT:      Head:      Comments: (+) bitemporal wasting     Nose: Nose normal.      Mouth/Throat:      Mouth: Mucous membranes are dry.   Eyes:      General:         Right eye: No discharge.         Left eye: No discharge.   Cardiovascular:      Rate and Rhythm: Normal rate.   Pulmonary:      Effort: Pulmonary effort is normal.   Abdominal:      General: There is no distension.      Comments: (+) ostomy   Genitourinary:     Comments: Deferred  Musculoskeletal:      Comments: (+) generalized muscle wasting   Neurological:      Mental Status: She is oriented to person, place, and time.   Psychiatric:         Mood and Affect: Mood normal.         Behavior: Behavior normal.     A total of 35 minutes were spent in chart preparation/chart review, direct patient contact, review of pertinent studies and lab work as well as in documentation with additional 16 minutes spent in advanced care planning conversations.   This was all performed on the same day as the patient's visit.    Salvatore Marvel, NP-C, [facility]  Palliative Care  Pager: [phone]  Electronically signed by Jerral Ralph, MD at 12/23/2022 12:21 PM EST

## 2022-12-23 NOTE — Consults (Signed)
Associated Order(s): IP CONSULT TO HOSPICE  Formatting of this note might be different from the original.  [place]     Name: Sue [name]  Medical Record Number: [id number]  Date of Birth: [birth year]    Code Status: Full  DDNR completed and signed No    Goals and Hospice Discussion   Met with patient and her daughter on speakerphone. Discussed hospice philosophy of comfort focused care, benefits, services and policies. All questions answered. Family is being very supportive. Patient requesting to go home with hospice before Friday. Plan to discharge patient home tomorrow afternoon. Patients husband and family are moving furniture around today so that equipment can be delivered tomorrow morning.    Patient is choosing to go home with [facility]    Followup :     TERMINAL DIAGNOSIS: Colorectal Cancer, Failure to thrive     HOSPICE DISCHARGE PLAN:Pt to discharge home tomorrow afternoon via ambulance     FTD:[place]- Bed with overlay, half rails, portable oxygen, oxygen concentrator and over the bed table.    TRANSPORT:Tentatively scheduled for 3pm tomorrow    HOSPICE ADMISSION DATE:12/24/2022    Electronically signed by Renard Hamper, RN at 12/23/2022  2:48 PM EST

## 2022-12-23 NOTE — Care Plan (Signed)
Formatting of this note might be different from the original.  Goals:   Problem: PAIN - ADULT  Goal: Verbalizes/displays adequate comfort level or baseline comfort level  Outcome: Progressing    Problem: INFECTION - ADULT  Goal: Absence of infection during hospitalization  Outcome: Progressing  Goal: Absence of fever/infection during anticipated neutropenic period  Outcome: Progressing    Problem: SAFETY ADULT - FALL  Goal: Free from fall injury  Outcome: Progressing    Problem: Potential for Compromised Skin Integrity  Goal: Skin Integrity is Maintained or Improved  Outcome: Progressing  Goal: Nutritional status is improving  Outcome: Progressing    Problem: Hemodynamic Status  Goal: Patient's vitals signs are stable  Outcome: Progressing    Problem: Inadequate Airway Clearance  Goal: Patient will maintain patent airway  Outcome: Progressing  Goal: Patient will achieve/maintain normal respiratory rate/effort  Outcome: Progressing    Problem: Inadequate Gas Exchange  Goal: Patient is adequately oxygenated and ventilation is improved  Outcome: Progressing    Problem: Compromised Skin Integrity  Description: Use this problem when pressure ulcers are classified as stage I or II.  Goal: Skin Integrity is Maintained or Improved  Outcome: Progressing  Goal: Nutritional status is improving  Outcome: Progressing    Problem: Nutrition  Goal: Nutritional status is improving  Outcome: Progressing        Identify possible barriers to meeting goals/advancing plan of care: none    Stability of the patient: Moderately Stable - Low risk of patient condition declining or worsening    End of Shift Summary:  Electronically signed by Leonides Schanz, RN at 12/24/2022  3:48 AM EST

## 2022-12-24 NOTE — Progress Notes (Signed)
Formatting of this note might be different from the original.  PALLIATIVE NOTE    PC follow-up.  EMR reviewed.  Noted plans for d/c to home with hospice later today.  Stopped into room - pt was sound asleep and appeared comfortable.    PC will continue to follow peripherally at this time as GOC are set.    Salvatore Marvel, NP-C, [facility]  Palliative Care  Pager: [phone]  Electronically signed by Jerral Ralph, MD at 12/25/2022  8:32 AM EST

## 2022-12-24 NOTE — Discharge Summary (Signed)
Formatting of this note is different from the original.  Images from the original note were not included.    General Discharge Summary    BRIEF OVERVIEW  Admitting Provider: Chiquita Loth, MD  Discharge Provider: Lyn Records, MD  Primary Care Physician at Discharge: Vivianne Master, MD [phone]    Admission Date: 12/07/2022     Discharge Date: 12/24/2022  DOB:14-Feb-1961    Primary Discharge Diagnosis  Principal Problem:    Acute GI bleeding (POA: Yes)  Active Problems:    Chronic hepatitis C virus infection (CMS/HCC) (POA: Yes)    HTN (hypertension) (POA: Yes)    Vascular problem (POA: Yes)    Pneumoperitoneum (POA: No)  Resolved Problems:    None    Anticipated Discharge Disposition  Hospice/Home  Code Status at Discharge: Full Code    Outpatient Follow-Up  Future Appointments   Date Time Provider [place]   12/24/2022 To Be Determined Kimberlee Nearing, MSW [facility] None   12/24/2022  1:00 PM Davis Gourd, RN (Hospice) [facility] None   01/12/2023  2:00 PM Doreatha Lew, MD [facility]   01/14/2023  9:00 AM [facility] NN G4 CHAIR 4 [facility] NN [facility] NN   02/10/2023 12:45 PM Rigoberto Arteaga, OD [facility]     Follow Up Instructions       Follow-up with provider      Follow up provider: LOFGREN, REBECCA    Targeted follow up time: 1 - 2 weeks           Allergies:  Penicillins, Hydrocodone, Orange fruit [citrus], Tomato, Demerol hcl [meperidine], Ibuprofen, Morphine, Oxycodone, Strawberry, and Tylenol [acetaminophen]    Medications    Patient's Prior To Admission/Home Medication List at Time of Discharge       TAKE these medications      * acetaminophen 325 MG tablet  325 mg, Oral, Every 6 hours PRN, As needed  What changed: Another medication with the same name was added. Make sure you understand how and when to take each.  Commonly known as: TYLENOL    * acetaminophen 650 MG suppository  Unwrap and insert 1 suppository into the rectum every 4 hours as needed for fever or mild pain  What changed: You were  already taking a medication with the same name, and this prescription was added. Make sure you understand how and when to take each.  Commonly known as: TYLENOL    amLODIPine 10 MG tablet  10 mg, Oral, Daily  Commonly known as: NORVASC    bisacodyl 10 MG suppository  Unwrap and insert 1 suppository into the rectum daily as needed for constipation  Commonly known as: DULCOLAX    dexamethasone 4 MG tablet  Take 8 mg daily x 2 days after each chemo day  Commonly known as: DECADRON    docusate sodium 100 MG capsule  100 mg, Oral, 2 times daily, As needed   Commonly known as: COLACE    haloperidol 2 MG tablet  Take 1 tablet by mouth, sublingually or rectally every 4 hours as needed for confusion, agitation or nausea  Commonly known as: HALDOL    HYDROcodone-acetaminophen 5-325 MG per tablet  1-2 tablets, Oral, Every 6 hours PRN  Commonly known as: NORCO    hyoscyamine 0.125 MG SL tablet  Give 1 to 2 tablets sublingually every 4 hours as needed for terminal congestion  Commonly known as: LEVSIN    lidocaine 5 %  1 patch, Apply externally,  Daily, Remove & discard patch within 12 hours or as directed by MD.  Commonly known as: LIDODERM    * lidocaine-prilocaine cream  Topical, As needed  Commonly known as: EMLA    * lidocaine-prilocaine cream  Apply cream generously over port site and cover with plastic, at least 1 hour prior to each treatment appointment  Commonly known as: EMLA    LORazepam 1 MG tablet  Give 1 tablet by mouth, sublingually or rectally every 2 hours as needed for anxiety, restlessness or shortness of breath  Commonly known as: ATIVAN    losartan 50 MG tablet  50 mg, Oral, Daily  Commonly known as: COZAAR    Lubricant Eye Drops (PF) 0.4-0.3 % solution  Generic drug: Polyethyl Glyc-Propyl Glyc PF  1 drop, Both Eyes, 4 times daily, During treatment with mirvetuximab soravtansine instill 4 times daily and as needed for dry eyes.    Magnesium 400 MG capsule  400 mg, Oral, 2 times daily  Commonly known as: [employer]S  TRIPLE MAGNESIUM COMPLEX    methadone 10 MG tablet  No dose, route, or frequency recorded.  Commonly known as: DOLOPHINE    methylPREDNISolone 4 MG tablet therapy pack  use as directed FOLLOW DIRECTIONS ON BACK OF FOIL PACK    * ondansetron 8 MG tablet  8 mg, Oral, Every 8 hours PRN, As needed  Commonly known as: ZOFRAN    * ondansetron 8 MG tablet  8 mg, Oral, Every 8 hours PRN, Start second day after chemo  Commonly known as: ZOFRAN    prednisoLONE acetate 1 % ophthalmic suspension  1 drop, Both Eyes, 6 times daily, Starting the day prior to each infusion, use one drop in each eye 6 times a day until day 4; then use 1 drop in each eye 4 times a day on days 5-8 of each cycle.  Commonly known as: PRED FORTE    * pregabalin 200 MG capsule  200 mg, Oral, 2 times daily  Commonly known as: LYRICA    * Lyrica 200 MG capsule  Generic drug: pregabalin  200 mg, Oral, 2 times daily    * prochlorperazine 10 MG tablet  10 mg, Oral, Every 6 hours PRN, As needed   Commonly known as: COMPAZINE    * prochlorperazine 10 MG tablet  10 mg, Oral, Every 6 hours PRN  Commonly known as: COMPAZINE    promethazine 25 MG tablet  Give 1 tablet by mouth or rectally every 4 hours as needed for nausea or vomiting  Commonly known as: PHENERGAN         * This list has 10 medication(s) that are the same as other medications prescribed for you. Read the directions carefully, and ask your doctor or other care provider to review them with you.             Diet Instructions       Adult Discharge Diet Regular      Diet Type: Regular         Other Instructions       Ambulatory Referral to [place]      Face to Face Co-Signer?: Dr. Alexander Mt    Date of Face to Face Encounter (If encounter/office visit has not occurred, then select an approximate future date that is within 30 days): 12/16/2022    Homebound Reason: patient's activity has been medically restricted due to their condition    Skilled Disciplines Requested: Skilled Nursing    Skilled Nursing  Clinical  Findings: Patient needs teaching and/or continued skilled nursing care related to:    Choose all that apply: ostomy care    Physician to follow patient's care: Referring    Home Health Communication Order - if applicable the following will be carried out.: Yes    Wound Care: [place] wound care protocol if applicable    Pain: Monitor and take measures to decrease pain    Pressure Ulcer Prevention: Teach pressure ulcer prevention and implement interventions    Diabetic Foot Care: Assessment and teaching    Pressure Ulcer Treatment: Will be based on principles of moist wound healing    Fall Prevention: Assessment and teaching    Drug Interactions: MD is aware of the drug interactions within the patient's current medication list    Depression: Monitoring and current treatment    Lifting Restrictions (Specify)      4-6 weeks after surgery    Maximum Weigth to Lift: 15 Pounds    Not on Warfarin. No warfarin follow up needed.      Post-Discharge Dressing Care: No dressing needed.      Dressing Removal: No dressing needed.         Test Results Pending at Discharge  Pending Results       Order Current Status    Basic metabolic panel (daily) In process    CBC and differential (daily) In process    Magnesium (daily) In process    Phosphorus (daily) In process    Blood Culture Preliminary result    Blood Culture Preliminary result         F Code Status:  Incomplete F Codes       No Incomplete F-Codes Found         Completed F Codes       No Incomplete F-Codes Found         DETAILS OF HOSPITAL STAY    Admit Diagnosis  Acute GI bleeding [facility]    Hospital Course  Cniyah Sproull is a 62 y.o.female with hx of colon cancer s/p right hemicolectomy & terminal ileectomy (2015), metastatic ovarian cancer s/p left sapingo-oophorectomy (2015), 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 CT scan of the abdomen with rectosigmoid colon wall thickening and proximal small and large bowel dilatation concerning for colonic obstruction vs neoplasm. Patient underwent EGD and flex sigmoidoscopy by GI on 12/09/22. Post-procedurally, patient had worsening abdominal distension and a repeat KUB revealed pneumoperitoneum. General surgery was consulted and patient underwent exploratory laparotomy with lysis of adhesions, palliative g-tube placement, and colostomy creation 1/16. Intraoperatively, patient found to have dense adhesions and tumor burden throughout the abdomen resulting in obstruction and had a suspected transverse colon perforation. 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    Due to pt's desire and request, pt was placed on regular diet, however pt did not tolerate, she had significant nausea and vomiting which lead to aspiration. Pt was moved to ICU due to respiratory distress. Pt recovered and was moved out of the ICU in stable condition.    The patient will be discharged home with hospice.    Operative Procedures Performed  Procedure(s):  LAPAROTOMY, EXPLORATORY;  Lysis of adhesions, gastrostomy tube placement, ostomy creation    Consults: IP CONSULT TO GASTROENTEROLOGY  IP CONSULT TO  HEMATOLOGY ONCOLOGY  PHARMACY TO DOSE MEROPENEM  IP CONSULT TO PALLIATIVE CARE  IP CONSULT TO SPIRITUAL CARE  IP CONSULT TO VAIT  IP CONSULT TO HOSPICE  PHARMACY TO DOSE VANCO  PHARMACY TO DOSE ZOSYN  IP CONSULT TO COLORECTAL SURGERY  IP CONSULT TO HOSPICE    Bedside Procedures: None    Pertinent Test Results:     Physical Condition at Discharge  Discharge Condition:  stable    Vital Signs:  Patient Vitals for the past 24 hrs:   BP Temp Temp src Pulse Resp SpO2   12/24/22 0703 133/85 (!) 99.4 F (37.4 C) Oral (!) 100 18 98 %   12/24/22 0311 113/65 98.2 F (36.8 C) Oral  (!) 93 18 93 %   12/23/22 2226 135/78 (!) 99.2 F (37.3 C) Oral 88 17 95 %   12/23/22 1813 133/77 98 F (36.7 C) Oral 82 16 95 %   12/23/22 1540 110/66 98.2 F (36.8 C) Oral 73 17 90 %     Weight: 118 lb 9.7 oz (53.8 kg)    Lace Score (risk for readmission): 85  - Low:           0-28  - Moderate: 29-70  - High:            >70    Face to Face Patient Counseling / Coordinating Care >50% of Encounter Time: RHS F to F Enc Time (YES/NO): Yes  Total Encounter Time (in minutes):  [place], DO  General Surgery, PGY-1  ACS Pager [phone]      Electronically signed by Minda Ditto, DO at 12/24/2022 10:26 AM EST

## 2022-12-24 NOTE — Telephone Encounter (Signed)
Formatting of this note might be different from the original.  Call from [place], patient has many allergy /intolerances to medications.   She has been on hydromorphone '4mg'$  in the hospital and tolerated this well.   Will send hydromorphone '4mg'$  4 x daily to [facility].    Has metastatic cancer with abdominal pain     Electronically Signed By: Ceasar Lund, MD 12/24/2022 6:43 PM     Electronically signed by Ceasar Lund, MD at 12/24/2022  6:43 PM EST

## 2022-12-24 NOTE — Care Plan (Signed)
Formatting of this note might be different from the original.    Problem: PAIN - ADULT  Goal: Verbalizes/displays adequate comfort level or baseline comfort level  Outcome: Progressing  Flowsheets (Taken 12/21/2022 2141 by Doretha Imus, RN)  Verbalizes/Displays Adequate Comfort Level or Baseline Comfort Level:   Assess pain using appropriate pain scale   Encourage patient to monitor pain and request assistance    Problem: SAFETY ADULT - FALL  Goal: Free from fall injury  Outcome: Progressing  Flowsheets (Taken 12/18/2022 0745 by Barb Merino, RN)  Fall prevention/injury reduction interventions for ALL patients scoring 5 or less on the [place]:   Orient patient/family to surroundings   Maintain bed in lowest position with brakes locked   Place **call bell** and personal items in easy reach   **Respond promptly to call light   Keep ambulatory assistive devices within easy reach   Instruct all ambulatory patients to use skid proof footwear   Assess patient for use of side rails X 2   Include family in making plan of care for patient      Electronically signed by Jimmie Molly, RN at 12/24/2022  8:16 AM EST

## 2022-12-24 NOTE — Progress Notes (Signed)
Formatting of this note might be different from the original.  Beth RN Completed patient education at bedside of discharge information.     This RN removed PIV.    Patient to dc home with Gtube, mediport, and ostomy in place. Patient received care instruction for Gtube, mediport and ostomy.  Patient aware of hospice services coming to home for home care services.    Patient transported home via EMS.   Electronically signed by Jimmie Molly, RN at 12/24/2022  5:30 PM EST

## 2022-12-24 NOTE — Progress Notes (Signed)
Formatting of this note might be different from the original.  Pt to discharge home today. Equipment is en route. Admission nurse on standby. Will continue to follow inpatient until discharged.  Electronically signed by Renard Hamper, RN at 12/24/2022  4:10 PM EST

## 2022-12-24 NOTE — Procedures (Signed)
Formatting of this note might be different from the original.  Images from the original note were not included.  Inpatient Ostomy Services Note    Reason for consult: Discharge ostomy teaching.     Subjective: Patient is awake and is upset. Patient states that she feels likes she has been experimented on and is being pumped full of medications. Patient does not wish to participate in changing ostomy appliance.     Objective: Patient presents on [place] bed.     Assessment:    Location: LLQ  Stoma: loop colostomy with red rubber catheter bridge sutured in place, red, moist, central os, productive  Mucocutaneous junction: healing  Peristomal skin: intact  Effluent: brown, loose  Pouching supplies: Stomahesive around colostomy and red rubber catheter, 2 3/4" Hollister flat wafer (#62947) and drainable ostomy pouch (#[id number])      Current ostomy appliance changed using the following steps:  Current wafer and pouch removed using adhesive remover wipes.  Stoma and surrounding skin cleansed with enzymatic spray and soft cloths.  Stoma measured 36 x 44 mm  Wafer cut to appropriate size for stoma and red rubber catheter bridge.  Stomahesive paste applied around stoma and red rubber catheter bridge.   Wafer applied to skin  Pouch applied to wafer    Plan:  Patient is discharging with hospice. Patient will continue familiarizing herself with the colostomy care routine. Patient knows that the [facility] is available to assist with difficulties with pouching colostomy and peri-stomal skin irritation.     Time spent with patient: [id number]    Electronically signed by Flonnie Overman, RN at 12/24/2022  3:19 PM EST

## 2022-12-30 NOTE — Telephone Encounter (Signed)
Formatting of this note might be different from the original.  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  During procedure had rupture of transverse colon .    Now has colostomy and palliative g tube in the transverse colon.      Eating very little due to nausea.   Pain is managed with oxycodone.   Will send morphine liquid to [facility] , in case we need liquid opioid .    Concerns re possible repeat bowel obstruction due to her extensive tumor burden and adhesions.  Also hx of aspiration and could have repeat of this.     Electronically Signed By: Ceasar Lund, MD 12/30/2022 10:42 AM     Electronically signed by Ceasar Lund, MD at 12/30/2022 10:44 AM EST

## 2022-12-31 ENCOUNTER — Encounter
Admit: 2022-12-31 | Payer: MEDICARE | Attending: Student in an Organized Health Care Education/Training Program | Primary: Student in an Organized Health Care Education/Training Program

## 2022-12-31 DIAGNOSIS — 1 ERRONEOUS ENCOUNTER ICD10: Secondary | ICD-10-CM

## 2022-12-31 NOTE — Progress Notes (Signed)
Chief Complaint   Patient presents with    Leg Swelling     Patient has been having swelling and pain in both legs since being d/c from [place] on 12/24/22. She says the swelling does improve when she elevates her legs but does not resolve. This has also affected her gait when she is walking.      "Have you been to the ER, urgent care clinic since your last visit?  Hospitalized since your last visit?"    YES - When: approximately 2  weeks ago.  Where and Why: [facility] 1/14-1/31.    "Have you seen or consulted any other health care providers outside of [facility] since your last visit?"    NO

## 2022-12-31 NOTE — Progress Notes (Signed)
Unable to connect with Telehealth. Plan to try again when family is present to help with the link/phone.

## 2022-12-31 NOTE — Telephone Encounter (Signed)
Patient had virtual visit with Dr. Talbert Nan this morning however she was not able to connect. She was told we would be able to try again later on today, patient agreed with this stating her granddaughter would be there to help her.     Called patient to see if she would be able to do the visit with Dr. Talbert Nan, no answer left her a message letting her know I was reaching back out to her to see if she was able to do the visit with Dr. Talbert Nan now. Asked that she call the office back on tomorrow and if he has an open appt we can attempt her visit again, office number left.

## 2023-01-01 ENCOUNTER — Telehealth
Admit: 2023-01-01 | Discharge: 2023-01-01 | Payer: MEDICARE | Attending: Student in an Organized Health Care Education/Training Program | Primary: Student in an Organized Health Care Education/Training Program

## 2023-01-01 DIAGNOSIS — R600 Localized edema: Secondary | ICD-10-CM

## 2023-01-01 MED ORDER — FUROSEMIDE 20 MG PO TABS
20 | ORAL_TABLET | ORAL | 3 refills | Status: AC
Start: 2023-01-01 — End: ?

## 2023-01-01 NOTE — Progress Notes (Signed)
Sue [name] (DOB: March 20, 1961) is a 62 y.o. female, Established patient patient, here for evaluation of the following chief complaint(s):   Leg Swelling and Leg Pain (Patient says her symptoms began after she was d/c from [facility] on 12/24/22. Her swelling and pain does improve when she elevates her feet however it does not resolve. Patient also says this has started to affect her gait. )       Assessment/Plan:  1. Bilateral leg edema  Likely multifactorial due to protein deficency, IVF in hospital, venous disease, amongst others. Although on hospice, will treat these symptoms for comfort. Take as instructed  I am starting with a low dose of lasix as I personally reviewed >3 BMP's from the hospital that showing AKI, that just resolved prior to discharge. I am not wanting to push it too far back that way. Low suspicion for AKI again now with good hydration and normal urinary output.   - furosemide (LASIX) 20 MG tablet; Take 1 tablet every morning for 1 week and then daily as needed for leg swelling  Dispense: 60 tablet; Refill: 3    -Offered my condolences and support regarding the sudden loss of her husband (and my pt) within the last week. Overall being supportive and leaning on God for aid.     Return if symptoms worsen or fail to improve.       Subjective/Objective:  HPI    LE Edema  - Legs swelling up after going to hospital. Elevating but still swollen. Both equally swelling. Legs feel tight, has some pitting edema.   - Eating fruits and veggies. Ensure. G tube has come out.   - Drinking 2 (16oz) bottles of water.     Hospice  - On medications Lyrica, dilaudid, nausea med. Not needing others right now       Physical Exam:       No data to display               Constitutional: '[x]'$  Appears well-developed and well-nourished '[x]'$  No apparent distress      '[]'$  Abnormal -     Mental status: '[x]'$  Alert and awake  '[x]'$  Oriented to person/place/time '[x]'$  Able to follow commands    '[]'$  Abnormal -     Eyes:   EOM     '[x]'$   Normal    '[]'$  Abnormal -   Sclera  '[x]'$   Normal    '[]'$  Abnormal -          Discharge '[x]'$   None visible   '[]'$  Abnormal -     HENT: '[x]'$  Normocephalic, atraumatic  '[]'$  Abnormal -   '[x]'$  Mouth/Throat: Mucous membranes are moist    External Ears '[x]'$  Normal  '[]'$  Abnormal -    Neck: '[x]'$  No visualized mass '[]'$  Abnormal -     Pulmonary/Chest: '[x]'$  Respiratory effort normal   '[x]'$  No visualized signs of difficulty breathing or respiratory distress        '[]'$  Abnormal -      Musculoskeletal:   '[x]'$  Normal gait with no signs of ataxia         '[x]'$  Normal range of motion of neck        '[]'$  Abnormal -     Neurological:        '[x]'$  No Facial Asymmetry (Cranial nerve 7 motor function) (limited exam due to video visit)          '[x]'$  No gaze palsy        '[]'$   Abnormal -          Skin:        '[x]'$  No significant exanthematous lesions or discoloration noted on facial skin         '[]'$  Abnormal -            Psychiatric:       '[x]'$  Normal Affect '[]'$  Abnormal -        '[x]'$  No Hallucinations    Other pertinent observable physical exam findings:-    Current Outpatient Medications:     furosemide (LASIX) 20 MG tablet, Take 1 tablet every morning for 1 week and then daily as needed for leg swelling, Disp: 60 tablet, Rfl: 3    HYDROmorphone (DILAUDID) 4 MG tablet, Take 1 tablet by mouth 4 times daily as needed., Disp: , Rfl:     doxazosin (CARDURA) 2 MG tablet, Take 1 tablet by mouth daily, Disp: , Rfl:     LYRICA 200 MG capsule, take 1 capsule by mouth every 8 hours, Disp: , Rfl:     ondansetron (ZOFRAN) 4 MG tablet, Take 1 tablet by mouth every 8 hours as needed for Nausea, Disp: 60 tablet, Rfl: 2    acetaminophen (TYLENOL) 325 MG tablet, Take 1 tablet by mouth every 6 hours as needed (Patient not taking: Reported on 08/25/2022), Disp: , Rfl:     atenolol (TENORMIN) 50 MG tablet, Take 1 tablet by mouth daily (Patient not taking: Reported on 08/25/2022), Disp: , Rfl:     docusate (COLACE, DULCOLAX) 100 MG CAPS, Take 100 mg by mouth 2 times daily (Patient not  taking: Reported on 12/31/2022), Disp: , Rfl:     HYDROcodone-acetaminophen (NORCO) 10-325 MG per tablet, Take 1 tablet by mouth every 6 hours as needed. (Patient not taking: Reported on 12/31/2022), Disp: , Rfl:     lidocaine (LIDODERM) 5 %, Apply 1 patch topically daily (Patient not taking: Reported on 12/31/2022), Disp: , Rfl:     lidocaine-prilocaine (EMLA) 2.5-2.5 % cream, Apply topically as needed (Patient not taking: Reported on 12/31/2022), Disp: , Rfl:     losartan (COZAAR) 100 MG tablet, Take 1 tablet by mouth daily (Patient not taking: Reported on 12/31/2022), Disp: , Rfl:     prochlorperazine (COMPAZINE) 10 MG tablet, Take 1 tablet by mouth every 6 hours as needed (Patient not taking: Reported on 10/13/2022), Disp: , Rfl:     Sue [name], was evaluated through a synchronous (real-time) audio-video encounter. The patient (or guardian if applicable) is aware that this is a billable service, which includes applicable co-pays. This Virtual Visit was conducted with patient's (and/or legal guardian's) consent. Patient identification was verified, and a caregiver was present when appropriate.   The patient was located at Home: [address]  [place] [address]  Provider was located at [facility] (Appt Dept): [address] [address]             --Vivianne Master, MD on 01/01/2023 at 1:39 PM

## 2023-01-14 ENCOUNTER — Ambulatory Visit
Payer: MEDICARE | Attending: Student in an Organized Health Care Education/Training Program | Primary: Student in an Organized Health Care Education/Training Program

## 2023-01-20 NOTE — Progress Notes (Signed)
Formatting of this note is different from the original.  Images from the original note were not included.  HOSPICE PATIENT VISIT     Patient is being seen at their residence at [place]      Primary care provider: Vivianne Master, MD  Hospice provider: Ceasar Lund, MD    Caregiver present: Hospice nurse Mateo Flow     CODE STATUS : FULL CODE at this time, will continue to discuss as disease progresses    Assessment and Plan     Related  to terminal diagnosis    Problems Addressed This Visit         Advance Directives and Health Issues    Hospice care patient    Overview     Enrolled hospice 12/24/22  Metastatic ovarian cancer  Colon involvement with colostomy  Venting G-tube in place due to perforated viscus  Lower extremity edema           Genitourinary and Reproductive    Hypomagnesemia    Current Assessment & Plan     Potential for recurrent hypomagnesemia due to her output from her gastrointestinal tract.  Will recommend she take the magnesium supplements 3 times a week to minimize symptoms related to this.         Hematology and Neoplasia    Ovarian CA, unspecified laterality (CMS/HCC)    Cancer associated pain    Overview     Current pain regimen includes hydromorphone 4 mg every 4 hours as needed to manage her pain.  We discussed not letting the pain get out of control and up to the maximum she can tolerate before taking medication.  She expresses she understands that but does not always want to take the medicine.       Cancer of right colon (CMS/HCC)    Overview     Last Assessment & Plan: Formatting of this note might be different from the original. There are no signs of recurrent colon cancer The patient has declined colonoscopy in the past Continue serial imaging study to monitor Formatting of this note might be different from the original. Last Assessment & Plan: Formatting of this note might be different from the original. There are no signs of recurrent colon cancer The patient has declined colonoscopy  in the past Continue serial imaging study to monitor         Musculolskeletal and Injuries    Ulcerated, foot, left, limited to breakdown of skin (CMS/HCC) - Primary    Overview     Related to rapid increase in lower extremity edema and development fluid pocket with rupture.  Wound care being provided at each visit.         Symptoms and Signs    Bilateral edema of lower extremity    Current Assessment & Plan     Has had some improvement with Lasix 40 mg daily and more diligent elevation of the legs.  She reports that the swelling significantly worsens when she is walking or standing on her feet.  She understands the importance elevation and decreasing the edema to allow for healing of the left foot wound.          Pain and symptom management:  Recommendation:   Resume magnesium 400 twice weekly   DC all BP meds   Monitor edema and pain     ----------------------------------------------------------------------------------------------------------------  History of present illness:    Chief Complaint   Patient presents with    Edema  HPI    Sue [name] is 62 y.o. female  Being seen at home for [facility] Hospice visit.  Primary Diagnosis  Malignant neoplasm of ovary, unspecified laterality (CMS/HCC)  Related Diagnoses  Colon cancer (CMS/HCC)  Bowel obstruction (CMS/HCC)  Gastrointestinal hemorrhage  Anemia  Gastrointestinal perforation (CMS/HCC)  Malnutrition (CMS/HCC)  Impaired mobility and activities of daily living  Chronic hepatitis C without hepatic coma (CMS/HCC)  Primary hypertension  Pneumoperitoneum    Change in status or new concerns : Has had increased LE edema and now has open wound on LEFT foot due to weeping and pressure from the edema.    LYMPH NODE involvement in pelvis /inguinal area   Bilateral  upper lobe pulmonary nodules- no pulmonary sxs at this time  Bowel obstruction with G tube in place for venting.  Has a collection bag attached with very little drainage at this time.  She does  have a colostomy and has had difficulty with leaking.  She has liquid stool and has had no nausea or abdominal pain recently.  She has eating a regular diet and states she is hungry all the time.    Her primary source of pain is related to the edema at this time.  She does have Dilaudid which she is taking every 4 hours needed.  She usually does not take it during the night.  Pain in the left foot , using dilaudid ,  7/10 right now.  When she takes her pain medicine regularly her pain can be a 0 if she is not standing or moving.  Lasix 40 mg daily has somewhat improved the edema.  She has also been more consistently Elevating the legs.  She does notice that this helps.  We also discussed this in the context of Wound healing as the edema will slow the process.  Legs feel heavy, when walking.  She tires easily when moving about the house.  She does have to sit and rest often.  Her legs are much better in the morning when she 1st gets up and as she is moving around and doing things they become more edematous.  Using cane  sometimes in the home.  Her family is worried about her being there by herself when her brother-in-law as at work.  She has been approved to have her daughter be her caregiver and paid by Medicaid.  This process is in the works and hospice social worker is involved.     SYMPTOM ASSESSMENT     PAIN:  7/10      Location: Lower extremities       Functional impact:Increases w/activity.  when standing or walking, Intermittent      Aggravating/Relieving factors:       CURRENT PAIN MANAGEMENT- Dilaudid 4 mg every 4 hours as needed          Review of Systems   Constitutional:  Positive for appetite change (improved) and fatigue.   HENT:  Negative for trouble swallowing.    Cardiovascular:  Positive for leg swelling.   Gastrointestinal:  Negative for abdominal distention, blood in stool and constipation.   Musculoskeletal:  Positive for myalgias.     Allergies  Allergies   Allergen Reactions    Penicillins  Other (see comments)     Childhood allergy    Hydrocodone Hives    Orange Fruit [Citrus] Itching     Highly acidic foods    Tomato Itching     Highly acidic foods    Demerol Hcl [  Meperidine] GI intolerance     Nausea/Vomiting    Ibuprofen GI intolerance     Nausea and vomiting    Morphine GI intolerance     Nausea/Vomiting    Oxycodone Itching    Strawberry Itching     Fruit and strawberry flavoring    Tylenol [Acetaminophen] GI intolerance     Nausea and Vomiting     The following portions of the patient's history were reviewed and updated as appropriate: allergies, current medications, past family history, past medical history, past social history, past surgical history and problem list.    Medications    Current Outpatient Medications:     Magnesium ([employer] TRIPLE MAGNESIUM COMPLEX) 400 MG capsule, 2 times weekly MON and FRI, Disp: , Rfl:     acetaminophen (TYLENOL) 650 MG suppository, Unwrap and insert 1 suppository into the rectum every 4 hours as needed for fever or mild pain, Disp: 4 suppository, Rfl: 0    bisacodyl (DULCOLAX) 10 MG suppository, Unwrap and insert 1 suppository into the rectum daily as needed for constipation, Disp: 2 suppository, Rfl: 0    furosemide (LASIX) 20 MG tablet, Take 20 mg by mouth daily. x 1 week, then daily prn  Indications: Edema, Disp: , Rfl:     haloperidol (HALDOL) 2 MG tablet, Take 1 tablet by mouth, sublingually or rectally every 4 hours as needed for confusion, agitation or nausea, Disp: 10 tablet, Rfl: 0    HYDROmorphone (DILAUDID) 4 MG tablet, Take 1 tablet (4 mg total) by mouth every 4 (four) hours as needed (malignant pain) Indications: Pain Hospice patient   [place] BILLING INFORMATION Doreen SalvageER:[id number],  RXPCN:  [id number], RXGRP: [id number],     Patient member ID:  [id number]   15 DAY SUPPLY, Disp: 90 tablet, Rfl: 0    hyoscyamine (LEVSIN) 0.125 MG SL tablet, Give 1 to 2 tablets sublingually every 4 hours as needed for terminal congestion, Disp: 10 tablet, Rfl:  0    lidocaine (LIDODERM) 5 %, Apply 1 patch topically daily Indications: pain Remove & discard patch within 12 hours or as directed by MD. , Disp: , Rfl:     LORazepam (ATIVAN) 1 MG tablet, Give 1 tablet by mouth, sublingually or rectally every 2 hours as needed for anxiety, restlessness or shortness of breath, Disp: 10 tablet, Rfl: 0    morphine sulfate 100 MG/5ML solution, 0.2ML-2.0ML PO OR SL every 2 hrs prn pain,dyspnea  HOSPICE PATIENT, Disp: 30 mL, Rfl: 0    Oxygen Therapy, Inhale 2 L daily as needed (hypoxia). Indications: Hypoxia, Disp: , Rfl:     pregabalin (LYRICA) 200 MG capsule, Take 200 mg by mouth 2 (two) times a day. Indications: Neuropathic Pain, Disp: , Rfl:     promethazine (PHENERGAN) 25 MG tablet, Give 1 tablet by mouth or rectally every 4 hours as needed for nausea or vomiting, Disp: 10 tablet, Rfl: 0    Wound Cleansers (WOUND CLEANSER EX), Apply 5 sprays topically continuously as needed (wound). Indications: Wound, Disp: , Rfl:     Wound Dressings (Triad Hydrophilic Wound Dressi) paste, Apply 1 Application topically continuously as needed (wound). Indications: Wound, Disp: , Rfl:     Physical exam  Vitals:    01/20/23 1122   BP: 112/66   Pulse: 89   SpO2: 95%     Wt Readings from Last 3 Encounters:   12/21/22 118 lb 9.7 oz (53.8 kg)   12/03/22 109 lb 10.9 oz (49.7 kg)  12/03/22 109 lb 10.9 oz (49.7 kg)     BP Readings from Last 3 Encounters:   01/20/23 112/66   01/20/23 112/60   01/19/23 110/60     Physical Exam  Cardiovascular:      Rate and Rhythm: Normal rate and regular rhythm.   Pulmonary:      Effort: Pulmonary effort is normal.      Breath sounds: Normal breath sounds.   Abdominal:      General: Abdomen is flat. Bowel sounds are normal.      Palpations: Abdomen is soft.      Tenderness: There is abdominal tenderness in the left upper quadrant and left lower quadrant.      Comments: Colostomy present and venting gastrostomy tube with attached bag (due to perforated viscus in her  transverse colon).    Musculoskeletal:      Right lower leg: 2+ Pitting Edema present.      Left lower leg: 2+ Pitting Edema present.   Skin:         Comments: Elliptical wound on the left dorsal midfoot ,    Neurological:      Mental Status: She is alert.      A total of  70  minutes were spent in chart preparation/chart review, direct patient contact, review of pertinent studies and lab work as well as in documentation.  This was all performed in the allotted time before , during and after the visit.        Electronically signed by Ceasar Lund, MD at 01/23/2023 12:07 PM EST

## 2023-01-23 NOTE — Assessment & Plan Note (Signed)
Associated Problem(s): Hypomagnesemia  Formatting of this note might be different from the original.  Potential for recurrent hypomagnesemia due to her output from her gastrointestinal tract.  Will recommend she take the magnesium supplements 3 times a week to minimize symptoms related to this.  Electronically signed by Ceasar Lund, MD at 01/23/2023 12:05 PM EST

## 2023-01-23 NOTE — Assessment & Plan Note (Signed)
Associated Problem(s): Bilateral edema of lower extremity  Formatting of this note might be different from the original.  Has had some improvement with Lasix 40 mg daily and more diligent elevation of the legs.  She reports that the swelling significantly worsens when she is walking or standing on her feet.  She understands the importance elevation and decreasing the edema to allow for healing of the left foot wound.  Electronically signed by Ceasar Lund, MD at 01/23/2023 12:01 PM EST

## 2023-01-28 NOTE — Telephone Encounter (Signed)
Formatting of this note might be different from the original.  -[place]   Electronically Signed By: Ceasar Lund, MD 01/28/2023 12:47 PM     ---- Message from Davis Gourd, [place] (Hospice) sent at 01/28/2023 12:01 PM EST -----  Regarding: Dilaudid  Can you please send hard script to [facility] for her Dilaudid '4mg'$  q 4 hr prn     Mateo Flow       Electronically signed by Ceasar Lund, MD at 01/28/2023 12:48 PM EST

## 2023-01-28 NOTE — Telephone Encounter (Signed)
Formatting of this note might be different from the original.  Phone call to patient re worries about some rectal bleeding.  Recently started using thigh high compression hose and then had some perianal bleeding. She was concerned perhaps it was from the compression hose or from the HHA being a bit rough.  She has a colostomy due to partial colectomy and does not have bowel through her rectum.   .    She states that she panics and wants to make sure she is doing the right thing by being in hospice care.  She says she is unsure if her cancer is getting worse and the blood made her worried.  She has seen no more blood since that 1 episode and she does not feel any worse today.  When I saw her at the last home visit she was feeling good and things were going okay.  Over the last several days she has not been feeling as well and feels that this has not been a good week.    I reviewed with her that our expectation with the trajectory of her disease would be that she would have good weeks and bad weeks and ultimately as the disease progresses she would have more bad days than good days because worsening disease.  She says she understands this and is okay with leaving things the way they are and she will continue to focus on the good days.      Electronically signed by Ceasar Lund, MD at 01/29/2023  3:29 PM EST

## 2023-02-04 NOTE — Telephone Encounter (Signed)
Formatting of this note is different from the original.  Lets do weekly and see how it goes.  TM     For Sue [name] how often should we change her ostomy wafer? We were having issues with leaking and that seems to be resolved so changing every visit isn?t necessary. Is it weekly?    Davis Gourd  Electronically signed by Ceasar Lund, MD at 02/04/2023 12:03 PM EDT

## 2023-02-09 NOTE — Telephone Encounter (Signed)
Formatting of this note might be different from the original.  Sent to [facility]     Electronically Signed By: Ceasar Lund, MD 02/09/2023 5:10 PM     ----- Message from Armandina Gemma, RN (Hospice) sent at 02/09/2023  2:34 PM EDT -----  Regarding: med order  Pt is a 62 year old female with hospice dx of ovarian CA. Pt currently has order for Hydromorphone 4mg  by mouth every 4 hours prn pain. Refill needed at this time. Please send to [facility].     Thank you,  Amy, RN  Electronically signed by Ceasar Lund, MD at 02/09/2023  5:11 PM EDT

## 2023-02-24 NOTE — Telephone Encounter (Signed)
Patient is requesting (New) referral to the following office:    Speciality: ONCOLOGY   Specialist Name:   Office Location: ([place])  Phone (if available):   Fax (if available):   Diagnosis:   Date of appointment (if scheduled):         Pt would like a call once referral has been submitted. Please review and advise as soon as possible.

## 2023-02-27 NOTE — Telephone Encounter (Signed)
Pt stays in [place].   This is the closet one to her home or [employer].    [facility]  [address], [place], [place] [address]    Please Review and advise if patient will require a visit.

## 2023-03-16 ENCOUNTER — Telehealth: Payer: Self-pay

## 2023-03-16 NOTE — Telephone Encounter (Signed)
Formatting of this note might be different from the original.  SENT TO [facility] [address]     ----- Message from Cassie Freer sent at 03/16/2023  9:03 AM EDT -----  Regarding: dilaudid refill  Please send refill to [facility] and THANK YOU!  Electronically signed by Cassie Freer, MD at 03/16/2023  9:09 AM EDT

## 2023-03-16 NOTE — Telephone Encounter (Signed)
Returned her call. She is asking when her port was placed at [facility]. Told her the date of insertion is 02/08/33 and the brand name is Bard per her request. She found the card for the port and appreciated the call.

## 2023-04-02 NOTE — Telephone Encounter (Signed)
Formatting of this note might be different from the original.  Sent to [facility]     ----- Message from Cassie Freer sent at 04/02/2023  8:43 AM EDT -----  Regarding: Med refill  Please send dilaudid refill to [facility]. THANK YOU!    Vikki Ports  Electronically signed by Cassie Freer, MD at 04/02/2023  9:25 AM EDT

## 2023-04-14 NOTE — Telephone Encounter (Signed)
Formatting of this note might be different from the original.  Call from [name] nurse   Patient has been having feculent emesis for several hours.    Attempts have been made to manage with multiple oral medications that are available in the home. Unsuccessful and she continues vomiting , unable to take pain medications or tolerate any oral intake.     Patient has ovarian cancer , colon involvement with colostomy , had a venting G tube previously in transverse colon due to perforated viscus .  This has dislodged several weeks ago and had no sxs related to this.     Has been Using hydromorphone at home for pain related to her cancer diagnosis.      Needs inpatient hospice admission for symptom management as we have been unable to control at home.      Notified transfer center, spoke with Tammy. Spoke with [name] , will accept to [name] for sxs management.  Going to oncology floor.   Notified Peggye Ley , hospice manager.  They will work on arranging transportation to get he to [name].      Electronically Signed By: Cassie Freer, MD 04/14/2023 3:35 PM     Electronically signed by Cassie Freer, MD at 04/14/2023  3:35 PM EDT

## 2023-04-25 DEATH — deceased
